# Patient Record
Sex: Male | Born: 1955 | Race: Black or African American | Hispanic: No | Marital: Single | Smoking: Current every day smoker
Health system: Southern US, Community
[De-identification: ages and names within clinical notes are randomized; demographics above are authoritative.]

## PROBLEM LIST (undated history)

## (undated) DIAGNOSIS — M549 Dorsalgia, unspecified: Secondary | ICD-10-CM

## (undated) DIAGNOSIS — I10 Essential (primary) hypertension: Secondary | ICD-10-CM

## (undated) DIAGNOSIS — G8929 Other chronic pain: Secondary | ICD-10-CM

## (undated) DIAGNOSIS — E119 Type 2 diabetes mellitus without complications: Secondary | ICD-10-CM

## (undated) DIAGNOSIS — G629 Polyneuropathy, unspecified: Secondary | ICD-10-CM

## (undated) DIAGNOSIS — I2510 Atherosclerotic heart disease of native coronary artery without angina pectoris: Secondary | ICD-10-CM

## (undated) DIAGNOSIS — I4891 Unspecified atrial fibrillation: Secondary | ICD-10-CM

## (undated) DIAGNOSIS — C801 Malignant (primary) neoplasm, unspecified: Secondary | ICD-10-CM

## (undated) DIAGNOSIS — I509 Heart failure, unspecified: Secondary | ICD-10-CM

## (undated) DIAGNOSIS — M79606 Pain in leg, unspecified: Secondary | ICD-10-CM

## (undated) HISTORY — PX: CORONARY ANGIOPLASTY WITH STENT PLACEMENT: SHX49

## (undated) HISTORY — PX: OTHER SURGICAL HISTORY: SHX169

---

## 2011-08-12 NOTE — ED Notes (Signed)
Pt reports SSCP that began just PTA. Pt reports that pain radiates to the left arm. Pt states that pain is sharp with tightness in the chest. Pain improved with SL NTG by EMS.

## 2011-08-12 NOTE — ED Notes (Signed)
Pt reports that he was arguing with S.O. When sx began tonight.

## 2011-08-12 NOTE — ED Notes (Signed)
Patient c/o mid-sternal chest pain that radiates to left arm. Patient describes pain as a sharp pain onset PTA. Per patient states pain may be caused by an argument between self and SO. Patient denies any SOB, nausea or vomiting at this time. Patient is on cardiac monitor.

## 2011-08-13 LAB — CBC WITH AUTOMATED DIFF
ABS. BASOPHILS: 0 10*3/uL (ref 0.0–0.06)
ABS. EOSINOPHILS: 0.2 10*3/uL (ref 0.0–0.4)
ABS. LYMPHOCYTES: 2.6 10*3/uL (ref 0.9–3.6)
ABS. MONOCYTES: 0.8 10*3/uL (ref 0.05–1.2)
ABS. NEUTROPHILS: 4.7 10*3/uL (ref 1.8–8.0)
BASOPHILS: 0 % (ref 0–2)
EOSINOPHILS: 2 % (ref 0–5)
HCT: 42 % (ref 36.0–48.0)
HGB: 14.6 g/dL (ref 13.0–16.0)
LYMPHOCYTES: 31 % (ref 21–52)
MCH: 29.5 PG (ref 24.0–34.0)
MCHC: 34.8 g/dL (ref 31.0–37.0)
MCV: 84.8 FL (ref 74.0–97.0)
MONOCYTES: 10 % (ref 3–10)
MPV: 10.2 FL (ref 9.2–11.8)
NEUTROPHILS: 57 % (ref 40–73)
PLATELET: 276 10*3/uL (ref 135–420)
RBC: 4.95 M/uL (ref 4.70–5.50)
RDW: 12.9 % (ref 11.6–14.5)
WBC: 8.2 10*3/uL (ref 4.6–13.2)

## 2011-08-13 LAB — GLUCOSTABILIZER
D50 administered: 0 ml
D50 administered: 0 ml
D50 administered: 0 ml
D50 administered: 0 ml
D50 order: 0 ml
D50 order: 0 ml
D50 order: 0 ml
D50 order: 0 ml
Glucose: 457 mg/dL
Glucose: 259 mg/dL
Glucose: 143 mg/dL
Glucose: 109 mg/dL
High target: 160 mg/dL
High target: 160 mg/dL
High target: 160 mg/dL
High target: 160 mg/dL
Insulin adminstered: 7.9 units/hour
Insulin adminstered: 4 units/hour
Insulin adminstered: 1.7 units/hour
Insulin adminstered: 0.5 units/hour
Insulin order: 7.9 units/hour
Insulin order: 4 units/hour
Insulin order: 1.7 units/hour
Insulin order: 0.5 units/hour
Low target: 120 mg/dL
Low target: 120 mg/dL
Low target: 120 mg/dL
Low target: 120 mg/dL
Minutes until next BG: 60 min
Minutes until next BG: 60 min
Minutes until next BG: 60 min
Minutes until next BG: 60 min
Multiplier: 0.02
Multiplier: 0.02
Multiplier: 0.02
Multiplier: 0.01

## 2011-08-13 LAB — CARDIAC PANEL,(CK, CKMB & TROPONIN)
CK - MB: 3 ng/ml (ref 0.5–3.6)
CK - MB: 3.3 ng/ml (ref 0.5–3.6)
CK-MB Index: 1.3 % (ref 0.0–4.0)
CK-MB Index: 1.5 % (ref 0.0–4.0)
CK: 236 U/L (ref 39–308)
CK: 217 U/L (ref 39–308)
Troponin-I, QT: <0.04 NG/ML (ref 0.00–0.06)
Troponin-I, QT: <0.04 NG/ML (ref 0.00–0.06)

## 2011-08-13 LAB — EKG, 12 LEAD, SUBSEQUENT
Atrial Rate: 64 {beats}/min
Calculated P Axis: 30 degrees
Calculated R Axis: 13 degrees
Calculated T Axis: -38 degrees
Diagnosis: NORMAL
P-R Interval: 178 ms
Q-T Interval: 424 ms
QRS Duration: 86 ms
QTC Calculation (Bezet): 437 ms
Ventricular Rate: 64 {beats}/min

## 2011-08-13 LAB — METABOLIC PANEL, BASIC
Anion gap: 12 mmol/L (ref 3.0–18)
BUN/Creatinine ratio: 11 — ABNORMAL LOW (ref 12–20)
BUN: 12 MG/DL (ref 7.0–18)
CO2: 23 MMOL/L (ref 21–32)
Calcium: 9.3 MG/DL (ref 8.5–10.1)
Chloride: 99 MMOL/L — ABNORMAL LOW (ref 100–108)
Creatinine: 1.1 MG/DL (ref 0.6–1.3)
GFR est AA: >60 mL/min/{1.73_m2} (ref >60)
GFR est non-AA: >60 mL/min/{1.73_m2} (ref >60)
Glucose: 429 MG/DL — CR (ref 74–99)
Potassium: 3.6 MMOL/L (ref 3.5–5.5)
Sodium: 134 MMOL/L — ABNORMAL LOW (ref 136–145)

## 2011-08-13 LAB — EKG, 12 LEAD, INITIAL
Atrial Rate: 85 {beats}/min
Calculated P Axis: 50 degrees
Calculated R Axis: 36 degrees
Calculated T Axis: -24 degrees
Diagnosis: NORMAL
P-R Interval: 186 ms
Q-T Interval: 362 ms
QRS Duration: 76 ms
QTC Calculation (Bezet): 430 ms
Ventricular Rate: 85 {beats}/min

## 2011-08-13 LAB — GLUCOSE, POC
Glucose (POC): 259 mg/dL — ABNORMAL HIGH (ref 70–110)
Glucose (POC): 143 mg/dL — ABNORMAL HIGH (ref 70–110)
Glucose (POC): 109 mg/dL (ref 70–110)
Glucose (POC): 149 mg/dL — ABNORMAL HIGH (ref 70–110)
Glucose (POC): 457 mg/dL — CR (ref 70–110)

## 2011-08-13 LAB — NT-PRO BNP: NT pro-BNP: 16 PG/ML (ref 0–250)

## 2011-08-13 MED ORDER — ENOXAPARIN 100 MG/ML SUB-Q SYRINGE
100 mg/mL | SUBCUTANEOUS | Status: AC
Start: 2011-08-13 — End: 2011-08-13
  Administered 2011-08-13: 06:00:00 via SUBCUTANEOUS

## 2011-08-13 MED ORDER — ONDANSETRON (PF) 4 MG/2 ML INJECTION
4 mg/2 mL | Freq: Once | INTRAMUSCULAR | Status: AC
Start: 2011-08-13 — End: 2011-08-13
  Administered 2011-08-13: 07:00:00 via INTRAVENOUS

## 2011-08-13 MED ORDER — HYDROMORPHONE 2 MG/ML INJECTION SOLUTION
2 mg/mL | Freq: Once | INTRAMUSCULAR | Status: AC
Start: 2011-08-13 — End: 2011-08-13
  Administered 2011-08-13: 07:00:00 via INTRAVENOUS

## 2011-08-13 MED ORDER — NITROGLYCERIN IN D5W 100 MG/250 ML (0.4 MG/ML) IV
100 mg/250 mL (400 mcg/mL) | INTRAVENOUS | Status: DC
Start: 2011-08-13 — End: 2011-08-13

## 2011-08-13 MED ORDER — OXYCODONE-ACETAMINOPHEN 5 MG-325 MG TAB
5-325 mg | ORAL_TABLET | ORAL | Status: DC | PRN
Start: 2011-08-13 — End: 2014-06-07

## 2011-08-13 MED ORDER — NITROGLYCERIN IN D5W 25 MG/250 ML (0.1 MG/ML) IV
25 mg/0 mL (100 mcg/mL) | INTRAVENOUS | Status: DC
Start: 2011-08-13 — End: 2011-08-13

## 2011-08-13 MED ORDER — SODIUM CHLORIDE 0.9 % IV
100 unit/mL | INTRAVENOUS | Status: DC
Start: 2011-08-13 — End: 2011-08-13
  Administered 2011-08-13 (×3): via INTRAVENOUS

## 2011-08-13 MED ORDER — ONDANSETRON (PF) 4 MG/2 ML INJECTION
4 mg/2 mL | Freq: Once | INTRAMUSCULAR | Status: AC
Start: 2011-08-13 — End: 2011-08-13
  Administered 2011-08-13: 04:00:00 via INTRAVENOUS

## 2011-08-13 MED ORDER — MORPHINE 4 MG/ML SYRINGE
4 mg/mL | Freq: Once | INTRAMUSCULAR | Status: AC
Start: 2011-08-13 — End: 2011-08-13
  Administered 2011-08-13: 04:00:00 via INTRAVENOUS

## 2011-08-13 MED ORDER — NITROGLYCERIN 0.4 MG SUBLINGUAL TAB
0.4 mg | SUBLINGUAL | Status: AC
Start: 2011-08-13 — End: 2011-08-13
  Administered 2011-08-13: 04:00:00 via SUBLINGUAL

## 2011-08-13 MED ORDER — SODIUM CHLORIDE 0.9% BOLUS IV
0.9 % | Freq: Once | INTRAVENOUS | Status: AC
Start: 2011-08-13 — End: 2011-08-13
  Administered 2011-08-13: 05:00:00 via INTRAVENOUS

## 2011-08-13 MED ORDER — INSULIN REGULAR HUMAN 100 UNIT/ML INJECTION
100 unit/mL | INTRAMUSCULAR | Status: AC
Start: 2011-08-13 — End: 2011-08-13
  Administered 2011-08-13: 05:00:00 via INTRAVENOUS

## 2011-08-13 NOTE — ED Notes (Signed)
Blood Glucose obtained POC, value 259 MD notified. Value entered into Glucose stabilizer.

## 2011-08-13 NOTE — ED Notes (Signed)
Patient verbalizes some relief from SL 0.4 nitroglycerin tablet, patient staes, "the pain is easing up but it's still there." Patient at rest remains on cardiac monitor.

## 2011-08-13 NOTE — ED Notes (Signed)
Per patient chest pain and left arm pain has resolved, pain 0/10.

## 2011-08-13 NOTE — ED Notes (Signed)
Dr. Sandi Mariscal at bedside speaking with patient and SO.

## 2011-08-13 NOTE — ED Notes (Signed)
I have reviewed discharge instructions with the patient.  The patient verbalized understanding.Patient armband removed and shredded

## 2011-08-13 NOTE — ED Notes (Signed)
Pt ambulated to bathroom and complained of nausea, pt vomited in bathroom and became dizzy upon returning to room.

## 2011-08-13 NOTE — ED Notes (Signed)
Blood glucose obtained POC, value 457. MD notified.

## 2011-08-13 NOTE — ED Notes (Signed)
Per MD orders insulin drip has been discontinued.

## 2011-08-13 NOTE — ED Notes (Signed)
Obtained blood glucose POC, 109. MD notified. Value 109  placed in Glucose stabilizer.

## 2011-08-13 NOTE — ED Provider Notes (Signed)
HPI Comments: 55 y.o. male presents to the ED C/O chest pain. The Pt states he began to have sharp chest pain "while arguing with my crazy baby momma" an hour ago. This chest pain is exacerbated by cough and is reported to feel similar to pain during previous MIs. Pt further notes some nausea associated with this chest pain. He recently spent 2 days at [facility] for chest pain and hyperglycemia (700s) and was Dx'ed with musculoskeletal chest pain. Pt notes he has not been "feeling right" since discharge from [facility] and has been under increased stress. Hx of 2 MIs, HTN, DM, hypercholesterolemia. Surgical Hx includes 3 cardiac stents. Last heart catheterization in 2010. Denies tobacco, alcohol, or illicit drug use. Pt denies any further Sx's or complaints.     Cardio: Dr. Randa Evens     Patient is a 55 y.o. male presenting with chest pain. The history is provided by the patient.   Chest Pain (Angina)   This is a new problem. The current episode started less than 1 hour ago. The problem has not changed since onset.The pain is associated with emotional upset. The quality of the pain is described as sharp. The pain does not radiate. Associated symptoms include cough and nausea. Pertinent negatives include no abdominal pain, no fever, no headaches, no palpitations, no shortness of breath and no vomiting. Risk factors include diabetes mellitus, stress, hypertension and male gender. Procedural history includes cardiac catheterization, echocardiogram and cardiac stents.  12:16 AM  Recorded by Arna Snipe, ED Scribe as dictated by Hetty Blend, MD     Past Medical History   Diagnosis Date   ??? CAD (coronary artery disease)    ??? Diabetes    ??? MI (myocardial infarction)    ??? Hypertension    ??? High cholesterol         Past Surgical History   Procedure Date   ??? Pr cardiac surg procedure unlist    ??? Hx coronary stent placement          No family history on file.     History     Social History   ??? Marital Status: N/A      Spouse Name: N/A     Number of Children: N/A   ??? Years of Education: N/A     Occupational History   ??? Not on file.     Social History Main Topics   ??? Smoking status: Never Smoker    ??? Smokeless tobacco: Not on file   ??? Alcohol Use: No   ??? Drug Use:    ??? Sexually Active:      Other Topics Concern   ??? Not on file     Social History Narrative   ??? No narrative on file                  ALLERGIES: Review of patient's allergies indicates no known allergies.      Review of Systems   Constitutional: Negative for fever and fatigue.   HENT: Negative for sore throat and rhinorrhea.    Respiratory: Positive for cough. Negative for shortness of breath.    Cardiovascular: Positive for chest pain. Negative for palpitations.   Gastrointestinal: Positive for nausea. Negative for vomiting, abdominal pain and diarrhea.   Genitourinary: Negative for dysuria and difficulty urinating.   Musculoskeletal: Negative for myalgias and arthralgias.   Skin: Negative for color change and rash.   Neurological: Negative for light-headedness and headaches.   12:16 AM  Recorded  by Arna Snipe, ED Scribe as dictated by Hetty Blend, MD    Filed Vitals:    08/12/11 2349 08/12/11 2352   BP: 107/80    Pulse: 83    Temp: 98 ??F (36.7 ??C)    Resp: 22    Height: 5\' 11"  (1.803 m)    Weight: 89.812 kg (198 lb)    SpO2: 99% 100%            Physical Exam   Vital signs and nursing notes reviewed    CONSTITUTIONAL: Middle aged male, appears to be anxious, alert, in mild pain distress; well-developed; well-nourished.  HEAD:  Normocephalic, atraumatic  EYES: PERRL; EOM's intact.  ENTM: Nose: no rhinorrhea; Throat: no erythema or exudate, mucous membranes moist  Neck:  No JVD, supple without lymphadenopathy  RESP: Chest clear, equal breath sounds.  CHEST: Chest wall tenderness to palpation  CV: S1 and S2 WNL; No murmurs, gallops or rubs.  GI: Normal bowel sounds, abdomen soft and non-tender. No masses or organomegaly.   GU: No costo-vertebral angle tenderness.  BACK:  Non-tender  UPPER EXT:  Normal inspection  LOWER EXT: No edema, no calf tenderness.  Distal pulses intact.  NEURO: CN intact, sensation intact.  SKIN: No rashes; Normal for age and stage.  PSYCH:  Alert and oriented, normal affect.   RECTAL: Brown stool, HEME negative      MDM     Differential Diagnosis; Clinical Impression; Plan:     DDx includes: ACS, Anxiety, Chest wall pain, Pleuritic chest pain, PNA    Pt presented with chest pain after an argument.  His cardiac enzymes are negative x 2.  He had a negative nuclear stress test in September.  Dr. Azucena Cecil recommends DC home and FU with Dr. Randa Evens.      Pt had a near syncopal episode near time of discharge.  He had been lying in bed for 6 hours, got up, became nauseated, vomited.  He had no chest pain.  After 15 minutes his orthostatics were normal.    Amount and/or Complexity of Data Reviewed:   Clinical lab tests:  Ordered and reviewed  Tests in the radiology section of CPT??:  Ordered and reviewed (CXR)  Tests in the medicine section of the CPT??:  Ordered and reviewed (EKG)  Discussion of test results with the performing providers:  No   Decide to obtain previous medical records or to obtain history from someone other than the patient:  No   Obtain history from someone other than the patient:  No   Review and summarize past medical records:  Yes   Discuss the patient with another provider:  No   Independant visualization of image, tracing, or specimen:  Yes (CXR and EKG)  Progress:   Patient progress:  Stable and improved      Procedures    Procedure Note - Rectal Exam:   6:49 AM  Performed by: Hetty Blend, MD  Rectal exam performed.  Brown stool was collected.  Stool was Hemoccult tested, and found to be heme Negative.   The procedure took 1-15 minutes, and pt tolerated well.  Written by Larey Days, ED Scribe, as dictated by Hetty Blend, MD.      Answered patient's questions regarding treatment plan.      PROGRESS NOTE:   2:01 AM  Pt has been re-examined by Kenn File, MD. Pt still C/O CP, will start Pt on nitro drip.   Written by Larey Days, ED Scribe,  as dictated by Hetty Blend, MD.     CONSULT NOTE:   2:14 AM  Hetty Blend, MD spoke with Dr. Waneta Martins   Specialty: Internal Medicine   Discussed pt's hx, disposition, and available diagnostic and imaging results. Reviewed care plans. Consulting physician agrees with plans as outlined.  Advises contacting Pt's cardiologist for admission.   Written by Larey Days, ED Scribe, as dictated by Hetty Blend, MD    CONSULT NOTE:   2:15 AM  Hetty Blend, MD spoke with Dr. Azucena Cecil   Specialty: Cardiology  Discussed pt's hx, disposition, and available diagnostic and imaging results. Reviewed care plans. Consulting physician agrees with plans as outlined.  Advises second set of enzymes and if negative can D/C with cardiology F/U today.   Written by Larey Days, ED Scribe, as dictated by Hetty Blend, MD        ED EKG interpretation:  NSR 85, nonspecific ST and T wave abnormality, read by Hetty Blend, MD at [phone]     ED EKG interpretation:  NSR 64, nonspecific T wave abnormality, no change from previous, read by Hetty Blend, MD at [phone]     Answered patient questions regarding Tx.    PROGRESS NOTE:   6:10 AM  Pt has been re-examined by Kenn File, MD. Pt feeling better, second set of enzymes negative, will D/C with cardiology follow up today.   Written by Larey Days, ED Scribe, as dictated by Hetty Blend, MD     6:13 AM   Patients results have been reviewed with them.  Patient and/or family have verbally conveyed their understanding and agreement of the patient's signs, symptoms, diagnosis, treatment and prognosis and additionally agree to follow up as recommended or return to the Emergency Room should their condition change prior to their follow-up appointment. Patient verbally agrees with the care-plan and verbally conveys that all of their questions have been answered.   Discharge instructions have also been provided to the patient with some educational information regarding their diagnosis as well a list of reasons why they would want to return to the ER prior to their follow-up appointment should their condition change.     Labs Reviewed   METABOLIC PANEL, BASIC - Abnormal; Notable for the following:     Sodium 134 (*)      Chloride 99 (*)      Glucose 429 (*)      BUN/Creatinine ratio 11 (*)      All other components within normal limits   GLUCOSE, POC - Abnormal; Notable for the following:     Glucose - (glucometer POC) 259 (*)      All other components within normal limits   GLUCOSE, POC - Abnormal; Notable for the following:     Glucose - (glucometer POC) 143 (*)      All other components within normal limits   CBC WITH AUTOMATED DIFF   CARDIAC PANEL,(CK, CKMB & TROPONIN)   PRO-BNP   GLUCOSTABILIZER   CARDIAC PANEL,(CK, CKMB & TROPONIN)   GLUCOSTABILIZER   GLUCOSTABILIZER   GLUCOSTABILIZER    Narrative:     per MD d/c Insulin drip   GLUCOSE, POC       Recent Results (from the past 12 hour(s))   EKG, 12 LEAD, INITIAL    Collection Time    08/12/11 11:46 PM       Component Value Range    Ventricular Rate 85      Atrial Rate 85  P-R Interval 186      QRS Duration 76      Q-T Interval 362      QTC Calculation (Bezet) 430      Calculated P Axis 50      Calculated R Axis 36      Calculated T Axis -24      Diagnosis        Value: Normal sinus rhythm      Nonspecific ST and T wave abnormality      Abnormal ECG       No previous ECGs available   CBC WITH AUTOMATED DIFF    Collection Time    08/12/11 11:50 PM       Component Value Range    WBC 8.2  4.6 - 13.2 K/uL    RBC 4.95  4.70 - 5.50 M/uL    HGB 14.6  13.0 - 16.0 g/dL    HCT 16.1  09.6 - 04.5 %    MCV 84.8  74.0 - 97.0 FL    MCH 29.5  24.0 - 34.0 PG    MCHC 34.8  31.0 - 37.0 g/dL    RDW 40.9  81.1 - 91.4 %    PLATELET 276  135 - 420 K/uL    MPV 10.2  9.2 - 11.8 FL    NEUTROPHILS 57  40 - 73 %    LYMPHOCYTES 31  21 - 52 %    MONOCYTES 10  3 - 10 %    EOSINOPHILS 2  0 - 5 %    BASOPHILS 0  0 - 2 %    ABS. NEUTROPHILS 4.7  1.8 - 8.0 K/UL    ABS. LYMPHOCYTES 2.6  0.9 - 3.6 K/UL    ABS. MONOCYTES 0.8  0.05 - 1.2 K/UL    ABS. EOSINOPHILS 0.2  0.0 - 0.4 K/UL    ABS. BASOPHILS 0.0  0.0 - 0.06 K/UL    DF AUTOMATED     METABOLIC PANEL, BASIC    Collection Time    08/12/11 11:50 PM       Component Value Range    Sodium 134 (*) 136 - 145 MMOL/L    Potassium 3.6  3.5 - 5.5 MMOL/L    Chloride 99 (*) 100 - 108 MMOL/L    CO2 23  21 - 32 MMOL/L    Anion gap 12  3.0 - 18 mmol/L    Glucose 429 (*) 74 - 99 MG/DL    BUN 12  7.0 - 18 MG/DL    Creatinine 1.1  0.6 - 1.3 MG/DL    BUN/Creatinine ratio 11 (*) 12 - 20      GFR est AA >60  >60 ml/min/1.50m2    GFR est non-AA >60  >60 ml/min/1.63m2    Calcium 9.3  8.5 - 10.1 MG/DL   CARDIAC PANEL,(CK, CKMB & TROPONIN)    Collection Time    08/12/11 11:50 PM       Component Value Range    CK 236  39 - 308 U/L    CK - MB 3.0  0.5 - 3.6 ng/ml    CK-MB Index 1.3  0.0 - 4.0 %    Troponin-I, Qt. <0.04  0.00 - 0.06 NG/ML   PRO-BNP    Collection Time    08/12/11 11:50 PM       Component Value Range    NT pro-BNP 16  0 - 250 PG/ML   GLUCOSTABILIZER    Collection Time  08/13/11  2:08 AM       Component Value Range    Glucose 457      Insulin order 7.9      Insulin adminstered 7.9      Multiplier 0.02      Low target 120      High target 160      D50 order 0.0      D50 administered 0.00      Minutes until next BG 60      Order initials sm       Administered initials sm     GLUCOSE, POC    Collection Time    08/13/11  3:11 AM       Component Value Range    Glucose - (glucometer POC) 259 (*) 70 - 110 mg/dL   GLUCOSTABILIZER    Collection Time    08/13/11  3:15 AM       Component Value Range    Glucose 259      Insulin order 4.0      Insulin adminstered 4.0      Multiplier 0.02      Low target 120      High target 160      D50 order 0.0      D50 administered 0.00      Minutes until next BG 60      Order initials sm      Administered initials sm     GLUCOSE, POC    Collection Time    08/13/11  4:19 AM       Component Value Range    Glucose - (glucometer POC) 143 (*) 70 - 110 mg/dL   GLUCOSTABILIZER    Collection Time    08/13/11  4:21 AM       Component Value Range    Glucose 143      Insulin order 1.7      Insulin adminstered 1.7      Multiplier 0.02      Low target 120      High target 160      D50 order 0.0      D50 administered 0.00      Minutes until next BG 60      Order initials SSM      Administered initials ADM     GLUCOSE, POC    Collection Time    08/13/11  5:23 AM       Component Value Range    Glucose - (glucometer POC) 109  70 - 110 mg/dL   CARDIAC PANEL,(CK, CKMB & TROPONIN)    Collection Time    08/13/11  5:27 AM       Component Value Range    CK 217  39 - 308 U/L    CK - MB 3.3  0.5 - 3.6 ng/ml    CK-MB Index 1.5  0.0 - 4.0 %    Troponin-I, Qt. <0.04  0.00 - 0.06 NG/ML   GLUCOSTABILIZER    Collection Time    08/13/11  5:27 AM       Component Value Range    Glucose 109      Insulin order 0.5      Insulin adminstered 0.5      Multiplier 0.01      Low target 120      High target 160      D50 order 0.0      D50 administered 0.00  Minutes until next BG 60      Order initials SSM      Administered initials ADM     EKG, 12 LEAD, SUBSEQUENT    Collection Time    08/13/11  5:31 AM       Component Value Range    Ventricular Rate 64      Atrial Rate 64      P-R Interval 178      QRS Duration 86      Q-T Interval 424       QTC Calculation (Bezet) 437      Calculated P Axis 30      Calculated R Axis 13      Calculated T Axis -38      Diagnosis        Value: Normal sinus rhythm      Nonspecific T wave abnormality      Abnormal ECG      When compared with ECG of 12-Aug-2011 23:46,      No significant change was found       CLINICAL IMPRESSION    1. Chest pain    2. Anxiety

## 2012-03-01 NOTE — ED Notes (Signed)
Assumed care of pt. Pt lying in bed resting with eyes closed. Awakens to voice. Complains of slight pain 5/10 to left side of face. Denies needs, vital signs updated. Wife at bedside. Call bell within reach. Will continue to monitor.

## 2012-03-01 NOTE — ED Notes (Signed)
Pt on cont cm nbp so2. INT in place, site benign. IVF complete. POC BGL completed. Spouse at bedside. Eyes closed, chest rises visible. Pt in NAD or pain.

## 2012-03-01 NOTE — ED Provider Notes (Signed)
HPI Comments: 56 y.o. male presents to the ED C/O CP, onset PTA. Pt reports after eating fish and rice, he began having CP. He states he ran to look in the mirror and "my face twisted up. And all of the sudden I couldn't swallow." Pt felt like his jaw was locked and he describes a shooting pain from his head down his left side. Pt Hx of ETOH use of beer. Pt Hx of MI. Pt was compliant with his Plavix but he didn't take aspirin today. EMS report pt wasn't real speaking to them and he was "playing Charades." Pt denies taking any "strange" medications like Haldol today, cough, fever, heavy lifting and any other Sx or complaints.      Patient is a 56 y.o. male presenting with chest pain. The history is provided by the patient and the EMS personnel. No language interpreter was used.   Chest Pain (Angina)   This is a new problem. The current episode started 2 days ago. The problem has not changed since onset.The problem occurs constantly. The pain radiates to the left arm. Pertinent negatives include no abdominal pain, no cough, no fever, no headaches, no nausea, no palpitations, no shortness of breath and no vomiting.   Written by Delia Heady, ED Scribe, as dictated by Bailey Mech, MD.     Past Medical History   Diagnosis Date   ??? CAD (coronary artery disease)    ??? Diabetes    ??? MI (myocardial infarction)    ??? Hypertension    ??? High cholesterol         Past Surgical History   Procedure Date   ??? Pr cardiac surg procedure unlist    ??? Hx coronary stent placement          History reviewed. No pertinent family history.     History     Social History   ??? Marital Status: SINGLE     Spouse Name: N/A     Number of Children: N/A   ??? Years of Education: N/A     Occupational History   ??? Not on file.     Social History Main Topics   ??? Smoking status: Never Smoker    ??? Smokeless tobacco: Not on file   ??? Alcohol Use: Yes   ??? Drug Use:    ??? Sexually Active:      Other Topics Concern   ??? Not on file     Social History Narrative   ???  No narrative on file                  ALLERGIES: Review of patient's allergies indicates no known allergies.      Review of Systems   Constitutional: Negative for fever and fatigue.   HENT: Negative for sore throat and rhinorrhea.    Respiratory: Negative for cough and shortness of breath.    Cardiovascular: Positive for chest pain. Negative for palpitations.   Gastrointestinal: Negative for nausea, vomiting, abdominal pain and diarrhea.   Genitourinary: Negative for dysuria and difficulty urinating.   Musculoskeletal: Negative for myalgias and arthralgias.   Skin: Negative for color change and rash.   Neurological: Negative for light-headedness and headaches.   All other systems reviewed and are negative.    Written by Delia Heady, ED Scribe, as dictated by Bailey Mech, MD.     Filed Vitals:    03/01/12 2200 03/01/12 2230 03/01/12 2325 03/01/12 2359   BP: 134/68 122/78 124/70  Pulse: 90 87 90    Temp:    98.6 ??F (37 ??C)   Resp: 15 18 18     Height:       Weight:       SpO2: 98% 99% 97%             Physical Exam   Nursing note and vitals reviewed.  Constitutional: He is oriented to person, place, and time. He appears well-developed and well-nourished.   HENT:   Head: Normocephalic and atraumatic.   Mouth/Throat: Oropharynx is clear and moist.   Eyes: Conjunctivae and EOM are normal. Pupils are equal, round, and reactive to light.   Neck: Normal range of motion. Neck supple. No JVD present. No tracheal deviation present. No thyromegaly present.   Cardiovascular: Normal rate, regular rhythm, normal heart sounds and intact distal pulses.  Exam reveals no gallop and no friction rub.    No murmur heard.  Pulmonary/Chest: Effort normal and breath sounds normal. No respiratory distress. He has no wheezes. He has no rales. He exhibits tenderness (chest wall TTP).   Abdominal: Soft. Bowel sounds are normal. He exhibits no distension and no mass. There is no tenderness. There is no rebound and no guarding.    Musculoskeletal: Normal range of motion.   Neurological: He is alert and oriented to person, place, and time. He has normal reflexes. No cranial nerve deficit.   Skin: Skin is warm and dry. No rash noted.   Psychiatric: He has a normal mood and affect. His behavior is normal.        MDM     Differential Diagnosis; Clinical Impression; Plan:     DDx includes: dystonic reaction, ACS, chest wall pain, GERD  Amount and/or Complexity of Data Reviewed:   Clinical lab tests:  Ordered and reviewed  Tests in the radiology section of CPT??:  Ordered and reviewed (CXR)  Tests in the medicine section of the CPT??:  Ordered and reviewed (EKG)   Obtain history from someone other than the patient:  No   Discuss the patient with another provider:  Yes (EMS)   Independant visualization of image, tracing, or specimen:  Yes (CXR, EKG)      Procedures    PROGRESS NOTE:   8:22 PM Answered patient questions regarding Tx.   Written by Delia Heady, ED Scribe, as dictated by Bailey Mech, MD.    Results    ED EKG interpretation:  Rhythm: normal sinus rhythm; and regular . Rate (approx.): 94; Read by Bailey Mech, MD at 2021.    RADIOLOGY RESULT:  8:55 PM   chest x-ray shows NAD.   Pending review from the radiologist.   Written by Delia Heady, ED Scribe, as dictated by Bailey Mech, MD.      Labs Reviewed   METABOLIC PANEL, COMPREHENSIVE - Abnormal; Notable for the following:     Potassium 3.4 (*)      Glucose 469 (*)      BUN/Creatinine ratio 8 (*)      AST 14 (*)      All other components within normal limits   DRUG SCREEN, URINE - Abnormal; Notable for the following:     THC (TH-CANNABINOL) POSITIVE (*)      COCAINE POSITIVE (*)      All other components within normal limits   GLUCOSE, POC - Abnormal; Notable for the following:     Glucose - (glucometer POC) 320 (*)      All other components within normal limits  GLUCOSE, POC - Abnormal; Notable for the following:     Glucose - (glucometer POC) 195 (*)      All other  components within normal limits   CBC WITH AUTOMATED DIFF   CARDIAC PANEL,(CK, CKMB & TROPONIN)   CARDIAC PANEL,(CK, CKMB & TROPONIN)   POC GLUCOSE       Recent Results (from the past 12 hour(s))   EKG, 12 LEAD, INITIAL    Collection Time    03/01/12  8:19 PM       Component Value Range    Ventricular Rate 94      Atrial Rate 94      P-R Interval 192      QRS Duration 76      Q-T Interval 356      QTC Calculation (Bezet) 445      Calculated P Axis 63      Calculated R Axis -20      Calculated T Axis 52      Diagnosis        Value: Normal sinus rhythm      Normal ECG      When compared with ECG of 13-Aug-2011 05:31,      Nonspecific T wave abnormality no longer evident in Inferior leads      Nonspecific T wave abnormality, improved in Lateral leads   CBC WITH AUTOMATED DIFF    Collection Time    03/01/12  8:20 PM       Component Value Range    WBC 7.7  4.6 - 13.2 K/uL    RBC 5.15  4.70 - 5.50 M/uL    HGB 15.4  13.0 - 16.0 g/dL    HCT 16.1  09.6 - 04.5 %    MCV 86.6  74.0 - 97.0 FL    MCH 29.9  24.0 - 34.0 PG    MCHC 34.5  31.0 - 37.0 g/dL    RDW 40.9  81.1 - 91.4 %    PLATELET 230  135 - 420 K/uL    MPV 9.8  9.2 - 11.8 FL    NEUTROPHILS 46  40 - 73 %    LYMPHOCYTES 44  21 - 52 %    MONOCYTES 9  3 - 10 %    EOSINOPHILS 1  0 - 5 %    BASOPHILS 0  0 - 2 %    ABS. NEUTROPHILS 3.5  1.8 - 8.0 K/UL    ABS. LYMPHOCYTES 3.4  0.9 - 3.6 K/UL    ABS. MONOCYTES 0.7  0.05 - 1.2 K/UL    ABS. EOSINOPHILS 0.1  0.0 - 0.4 K/UL    ABS. BASOPHILS 0.0  0.0 - 0.06 K/UL    DF AUTOMATED     CARDIAC PANEL,(CK, CKMB & TROPONIN)    Collection Time    03/01/12  8:20 PM       Component Value Range    CK 146  39 - 308 U/L    CK - MB 1.3  0.5 - 3.6 ng/ml    CK-MB Index 0.9  0.0 - 4.0 %    Troponin-I, Qt. <0.02  0.00 - 0.06 NG/ML   METABOLIC PANEL, COMPREHENSIVE    Collection Time    03/01/12  8:20 PM       Component Value Range    Sodium 137  136 - 145 MMOL/L    Potassium 3.4 (*) 3.5 - 5.5 MMOL/L    Chloride 101  100 - 108 MMOL/L  CO2 24  21 - 32  MMOL/L    Anion gap 12  3.0 - 18 mmol/L    Glucose 469 (*) 74 - 99 MG/DL    BUN 9  7.0 - 18 MG/DL    Creatinine 1.61  0.6 - 1.3 MG/DL    BUN/Creatinine ratio 8 (*) 12 - 20      GFR est AA >60  >60 ml/min/1.46m2    GFR est non-AA >60  >60 ml/min/1.19m2    Calcium 8.6  8.5 - 10.1 MG/DL    Bilirubin, total 0.3  0.2 - 1.0 MG/DL    ALT 38  09.6 - 04.5 U/L    AST 14 (*) 15 - 37 U/L    Alk. phosphatase 63  50 - 136 U/L    Protein, total 7.1  6.4 - 8.2 g/dL    Albumin 3.6  3.4 - 5.0 g/dL    Globulin 3.5  2.0 - 4.0 g/dL    A-G Ratio 1.0  0.8 - 1.7     DRUG SCREEN, URINE    Collection Time    03/01/12  9:10 PM       Component Value Range    PCP(PHENCYCLIDINE) NEGATIVE   NEGATIVE    THC (TH-CANNABINOL) POSITIVE (*) NEGATIVE    AMPHETAMINE NEGATIVE   NEGATIVE    BARBITURATES NEGATIVE   NEGATIVE    BENZODIAZEPINE NEGATIVE   NEGATIVE    COCAINE POSITIVE (*) NEGATIVE    OPIATES NEGATIVE   NEGATIVE    TRICYCLICS NEGATIVE   NEGATIVE    HDSCOM        Value: Specimen analysis was performed without chain of custody handling.  These results should be used for medical purposes only and not for legal or employment purposes.  Unconfirmed screening results must not be used for non-medical purposes.   GLUCOSE, POC    Collection Time    03/01/12 10:23 PM       Component Value Range    Glucose - (glucometer POC) 320 (*) 70 - 110 mg/dL   CARDIAC PANEL,(CK, CKMB & TROPONIN)    Collection Time    03/01/12 11:15 PM       Component Value Range    CK 122  39 - 308 U/L    CK - MB 1.9  0.5 - 3.6 ng/ml    CK-MB Index 1.6  0.0 - 4.0 %    Troponin-I, Qt. <0.02  0.00 - 0.06 NG/ML   GLUCOSE, POC    Collection Time    03/01/12 11:58 PM       Component Value Range    Glucose - (glucometer POC) 195 (*) 70 - 110 mg/dL      PROGRESS NOTE:   40:98 AM  Pt has been re-examined by Virl Diamond, MD. Patient resting comfortably, no complaints.  Admits to cocaine use.  Advised not to use cocaine.  Written by Tressie Ellis, ED Scribe, as dictated by Bailey Mech,  MD .     12:11 AM   Avry Burnstein's  results have been reviewed with him.  He has been counseled regarding his diagnosis, treatment, and plan.  He verbally conveys understanding and agreement of the signs, symptoms, diagnosis, treatment and prognosis and additionally agrees to follow up as discussed.  He also agrees with the care-plan and conveys that all of his questions have been answered.  I have also provided discharge instructions for him that include: educational information regarding their diagnosis and treatment, and list of  reasons why they would want to return to the ED prior to their follow-up appointment, should his condition change.       CLINICAL IMPRESSION    1. Diabetes mellitus    2. Hyperglycemia    3. Cocaine abuse

## 2012-03-01 NOTE — ED Notes (Signed)
Chest pain started while sitting at table PTA, pt is poor historian, history of MI X 2 in the past,

## 2012-03-01 NOTE — ED Notes (Signed)
POC glucose is 195 mg/dl. Pt resting comfortably on stretcher, awaiting MD to come discuss lab results with him. Denies needs.

## 2012-03-02 LAB — METABOLIC PANEL, COMPREHENSIVE
A-G Ratio: 1 (ref 0.8–1.7)
ALT (SGPT): 38 U/L (ref 12.0–78.0)
AST (SGOT): 14 U/L — ABNORMAL LOW (ref 15–37)
Albumin: 3.6 g/dL (ref 3.4–5.0)
Alk. phosphatase: 63 U/L (ref 50–136)
Anion gap: 12 mmol/L (ref 3.0–18)
BUN/Creatinine ratio: 8 — ABNORMAL LOW (ref 12–20)
BUN: 9 MG/DL (ref 7.0–18)
Bilirubin, total: 0.3 MG/DL (ref 0.2–1.0)
CO2: 24 MMOL/L (ref 21–32)
Calcium: 8.6 MG/DL (ref 8.5–10.1)
Chloride: 101 MMOL/L (ref 100–108)
Creatinine: 1.18 MG/DL (ref 0.6–1.3)
GFR est AA: >60 mL/min/{1.73_m2} (ref >60)
GFR est non-AA: >60 mL/min/{1.73_m2} (ref >60)
Globulin: 3.5 g/dL (ref 2.0–4.0)
Glucose: 469 MG/DL — CR (ref 74–99)
Potassium: 3.4 MMOL/L — ABNORMAL LOW (ref 3.5–5.5)
Protein, total: 7.1 g/dL (ref 6.4–8.2)
Sodium: 137 MMOL/L (ref 136–145)

## 2012-03-02 LAB — CARDIAC PANEL,(CK, CKMB & TROPONIN)
CK - MB: 1.3 ng/ml (ref 0.5–3.6)
CK - MB: 1.9 ng/ml (ref 0.5–3.6)
CK-MB Index: 0.9 % (ref 0.0–4.0)
CK-MB Index: 1.6 % (ref 0.0–4.0)
CK: 146 U/L (ref 39–308)
CK: 122 U/L (ref 39–308)
Troponin-I, QT: <0.02 NG/ML (ref 0.00–0.06)
Troponin-I, QT: <0.02 NG/ML (ref 0.00–0.06)

## 2012-03-02 LAB — CBC WITH AUTOMATED DIFF
ABS. BASOPHILS: 0 10*3/uL (ref 0.0–0.06)
ABS. EOSINOPHILS: 0.1 10*3/uL (ref 0.0–0.4)
ABS. LYMPHOCYTES: 3.4 10*3/uL (ref 0.9–3.6)
ABS. MONOCYTES: 0.7 10*3/uL (ref 0.05–1.2)
ABS. NEUTROPHILS: 3.5 10*3/uL (ref 1.8–8.0)
BASOPHILS: 0 % (ref 0–2)
EOSINOPHILS: 1 % (ref 0–5)
HCT: 44.6 % (ref 36.0–48.0)
HGB: 15.4 g/dL (ref 13.0–16.0)
LYMPHOCYTES: 44 % (ref 21–52)
MCH: 29.9 PG (ref 24.0–34.0)
MCHC: 34.5 g/dL (ref 31.0–37.0)
MCV: 86.6 FL (ref 74.0–97.0)
MONOCYTES: 9 % (ref 3–10)
MPV: 9.8 FL (ref 9.2–11.8)
NEUTROPHILS: 46 % (ref 40–73)
PLATELET: 230 10*3/uL (ref 135–420)
RBC: 5.15 M/uL (ref 4.70–5.50)
RDW: 12.8 % (ref 11.6–14.5)
WBC: 7.7 10*3/uL (ref 4.6–13.2)

## 2012-03-02 LAB — DRUG SCREEN, URINE
AMPHETAMINES: NEGATIVE
BARBITURATES: NEGATIVE
BENZODIAZEPINES: NEGATIVE
COCAINE: POSITIVE — AB
OPIATES: NEGATIVE
PCP(PHENCYCLIDINE): NEGATIVE
THC (TH-CANNABINOL): POSITIVE — AB
TRICYCLICS: NEGATIVE

## 2012-03-02 LAB — EKG, 12 LEAD, INITIAL
Atrial Rate: 94 {beats}/min
Calculated P Axis: 63 degrees
Calculated R Axis: -20 degrees
Calculated T Axis: 52 degrees
Diagnosis: NORMAL
P-R Interval: 192 ms
Q-T Interval: 356 ms
QRS Duration: 76 ms
QTC Calculation (Bezet): 445 ms
Ventricular Rate: 94 {beats}/min

## 2012-03-02 LAB — GLUCOSE, POC
Glucose (POC): 320 mg/dL — ABNORMAL HIGH (ref 70–110)
Glucose (POC): 195 mg/dL — ABNORMAL HIGH (ref 70–110)

## 2012-03-02 MED ORDER — SODIUM CHLORIDE 0.9% BOLUS IV
0.9 % | Freq: Once | INTRAVENOUS | Status: AC
Start: 2012-03-02 — End: 2012-03-02
  Administered 2012-03-02: 02:00:00 via INTRAVENOUS

## 2012-03-02 MED ORDER — SODIUM CHLORIDE 0.9 % IJ SYRG
INTRAMUSCULAR | Status: DC | PRN
Start: 2012-03-02 — End: 2012-03-02

## 2012-03-02 MED ORDER — NITROGLYCERIN 0.4 MG SUBLINGUAL TAB
0.4 mg | SUBLINGUAL | Status: AC
Start: 2012-03-02 — End: 2012-03-01
  Administered 2012-03-02: via SUBLINGUAL

## 2012-03-02 MED ORDER — INSULIN REGULAR HUMAN 100 UNIT/ML INJECTION
100 unit/mL | INTRAMUSCULAR | Status: AC
Start: 2012-03-02 — End: 2012-03-01
  Administered 2012-03-02: 03:00:00 via INTRAVENOUS

## 2012-03-02 MED ORDER — DIPHENHYDRAMINE HCL 50 MG/ML IJ SOLN
50 mg/mL | INTRAMUSCULAR | Status: AC
Start: 2012-03-02 — End: 2012-03-01
  Administered 2012-03-02: 01:00:00 via INTRAVENOUS

## 2012-03-02 MED ORDER — ASPIRIN 325 MG TAB
325 mg | ORAL | Status: AC
Start: 2012-03-02 — End: 2012-03-01
  Administered 2012-03-02: 01:00:00 via ORAL

## 2012-03-02 MED ORDER — INSULIN REGULAR HUMAN 100 UNIT/ML INJECTION
100 unit/mL | INTRAMUSCULAR | Status: AC
Start: 2012-03-02 — End: 2012-03-01
  Administered 2012-03-02: 02:00:00 via INTRAVENOUS

## 2012-03-02 MED ORDER — SODIUM CHLORIDE 0.9 % IJ SYRG
Freq: Three times a day (TID) | INTRAMUSCULAR | Status: DC
Start: 2012-03-02 — End: 2012-03-02

## 2012-03-02 MED FILL — ASPIRIN 325 MG TAB: 325 mg | ORAL | Qty: 1

## 2012-03-02 MED FILL — INSULIN REGULAR HUMAN 100 UNIT/ML INJECTION: 100 unit/mL | INTRAMUSCULAR | Qty: 1

## 2012-03-02 MED FILL — SODIUM CHLORIDE 0.9 % IV: INTRAVENOUS | Qty: 1000

## 2012-03-02 MED FILL — NITROSTAT 0.4 MG SUBLINGUAL TABLET: 0.4 mg | SUBLINGUAL | Qty: 1

## 2012-03-02 MED FILL — DIPHENHYDRAMINE HCL 50 MG/ML IJ SOLN: 50 mg/mL | INTRAMUSCULAR | Qty: 1

## 2012-03-02 MED FILL — BD POSIFLUSH NORMAL SALINE 0.9 % INJECTION SYRINGE: INTRAMUSCULAR | Qty: 10

## 2012-03-02 NOTE — ED Notes (Signed)
I have reviewed discharge instructions with the patient.  The patient verbalized understanding. Patient armband removed and shredded.

## 2012-03-02 NOTE — ED Notes (Signed)
Dr. Lapetina bedside to discuss lab results and follow up care with pt. Pt verbalizes understanding.

## 2012-05-28 MED ORDER — CARISOPRODOL 350 MG TAB
350 mg | ORAL_TABLET | Freq: Four times a day (QID) | ORAL | Status: DC
Start: 2012-05-28 — End: 2014-06-07

## 2012-05-28 MED ORDER — TRAMADOL 50 MG TAB
50 mg | ORAL_TABLET | Freq: Four times a day (QID) | ORAL | Status: DC | PRN
Start: 2012-05-28 — End: 2014-06-07

## 2012-05-28 NOTE — ED Notes (Signed)
I have reviewed discharge instructions with the patient.  The patient verbalized understanding. Patient armband removed and shredded
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
## 2013-01-14 LAB — D-DIMER, QUANTITATIVE: D-Dimer, Quant: 0.41 ug/ml(FEU) (ref <0.46)

## 2013-01-14 LAB — METABOLIC PANEL, COMPREHENSIVE
A-G Ratio: 0.8 (ref 0.8–1.7)
ALT (SGPT): 30 U/L (ref 12.0–78.0)
AST (SGOT): 17 U/L (ref 15–37)
Albumin: 3.5 g/dL (ref 3.4–5.0)
Alk. phosphatase: 53 U/L (ref 50–136)
Anion gap: 9 mmol/L (ref 3.0–18)
BUN/Creatinine ratio: 16 (ref 12–20)
BUN: 19 MG/DL — ABNORMAL HIGH (ref 7.0–18)
Bilirubin, total: 0.4 MG/DL (ref 0.2–1.0)
CO2: 26 mmol/L (ref 21–32)
Calcium: 9.2 MG/DL (ref 8.5–10.1)
Chloride: 100 mmol/L (ref 100–108)
Creatinine: 1.18 MG/DL (ref 0.6–1.3)
GFR est AA: >60 mL/min/{1.73_m2} (ref >60)
GFR est non-AA: >60 mL/min/{1.73_m2} (ref >60)
Globulin: 4.4 g/dL — ABNORMAL HIGH (ref 2.0–4.0)
Glucose: 357 mg/dL — ABNORMAL HIGH (ref 74–99)
Potassium: 4.2 mmol/L (ref 3.5–5.5)
Protein, total: 7.9 g/dL (ref 6.4–8.2)
Sodium: 135 mmol/L — ABNORMAL LOW (ref 136–145)

## 2013-01-14 LAB — DRUG SCREEN, URINE
AMPHETAMINES: NEGATIVE
BARBITURATES: NEGATIVE
BENZODIAZEPINES: NEGATIVE
COCAINE: NEGATIVE
METHADONE: NEGATIVE
OPIATES: POSITIVE — AB
PCP(PHENCYCLIDINE): NEGATIVE
THC (TH-CANNABINOL): NEGATIVE

## 2013-01-14 LAB — CBC WITH AUTOMATED DIFF
ABS. BASOPHILS: 0 10*3/uL (ref 0.0–0.06)
ABS. EOSINOPHILS: 0.2 10*3/uL (ref 0.0–0.4)
ABS. LYMPHOCYTES: 2.3 10*3/uL (ref 0.9–3.6)
ABS. MONOCYTES: 0.8 10*3/uL (ref 0.05–1.2)
ABS. NEUTROPHILS: 4.3 10*3/uL (ref 1.8–8.0)
BASOPHILS: 1 % (ref 0–2)
EOSINOPHILS: 3 % (ref 0–5)
HCT: 45.2 % (ref 36.0–48.0)
HGB: 15.8 g/dL (ref 13.0–16.0)
LYMPHOCYTES: 30 % (ref 21–52)
MCH: 30 PG (ref 24.0–34.0)
MCHC: 35 g/dL (ref 31.0–37.0)
MCV: 85.9 FL (ref 74.0–97.0)
MONOCYTES: 10 % (ref 3–10)
MPV: 9.3 FL (ref 9.2–11.8)
NEUTROPHILS: 56 % (ref 40–73)
PLATELET: 288 10*3/uL (ref 135–420)
RBC: 5.26 M/uL (ref 4.70–5.50)
RDW: 12.2 % (ref 11.6–14.5)
WBC: 7.6 10*3/uL (ref 4.6–13.2)

## 2013-01-14 LAB — URINALYSIS W/ RFLX MICROSCOPIC
Bilirubin: NEGATIVE
Blood: NEGATIVE
Glucose: >1000 mg/dL — AB
Ketone: NEGATIVE mg/dL
Leukocyte Esterase: NEGATIVE
Nitrites: NEGATIVE
Protein: NEGATIVE mg/dL
Specific gravity: 1.015 (ref 1.003–1.030)
Urobilinogen: 1 EU/dL (ref 0.2–1.0)
pH (UA): 6 (ref 5.0–8.0)

## 2013-01-14 LAB — INFLUENZA A & B AG (RAPID TEST)
Influenza A Antigen: NEGATIVE
Influenza B Antigen: NEGATIVE

## 2013-01-14 LAB — GLUCOSE, POC
Glucose (POC): 330 mg/dL — ABNORMAL HIGH (ref 70–110)
Glucose (POC): 152 mg/dL — ABNORMAL HIGH (ref 70–110)

## 2013-01-14 LAB — MAGNESIUM: Magnesium: 2.1 mg/dL (ref 1.8–2.4)

## 2013-01-14 LAB — D DIMER: D DIMER: 0.41 ug/ml(FEU) (ref <0.46)

## 2013-01-14 MED ORDER — INSULIN REGULAR HUMAN 100 UNIT/ML INJECTION
100 unit/mL | INTRAMUSCULAR | Status: AC
Start: 2013-01-14 — End: 2013-01-14
  Administered 2013-01-14: 18:00:00 via INTRAVENOUS

## 2013-01-14 MED ORDER — ONDANSETRON (PF) 4 MG/2 ML INJECTION
4 mg/2 mL | Freq: Once | INTRAMUSCULAR | Status: AC
Start: 2013-01-14 — End: 2013-01-14
  Administered 2013-01-14: 17:00:00 via INTRAVENOUS

## 2013-01-14 MED ORDER — SODIUM CHLORIDE 0.9% BOLUS IV
0.9 % | Freq: Once | INTRAVENOUS | Status: AC
Start: 2013-01-14 — End: 2013-01-14
  Administered 2013-01-14: 18:00:00 via INTRAVENOUS

## 2013-01-14 MED ORDER — SODIUM CHLORIDE 0.9% BOLUS IV
0.9 % | Freq: Once | INTRAVENOUS | Status: AC
Start: 2013-01-14 — End: 2013-01-14
  Administered 2013-01-14: 17:00:00 via INTRAVENOUS

## 2013-01-14 MED ORDER — KETOROLAC TROMETHAMINE 15 MG/ML INJECTION
15 mg/mL | INTRAMUSCULAR | Status: AC
Start: 2013-01-14 — End: 2013-01-14
  Administered 2013-01-14: 20:00:00 via INTRAVENOUS

## 2013-01-14 MED ORDER — MORPHINE 2 MG/ML INJECTION
2 mg/mL | Freq: Once | INTRAMUSCULAR | Status: AC
Start: 2013-01-14 — End: 2013-01-14
  Administered 2013-01-14: 17:00:00 via INTRAVENOUS

## 2013-01-14 MED ORDER — OXYCODONE-ACETAMINOPHEN 5 MG-325 MG TAB
5-325 mg | ORAL_TABLET | ORAL | Status: DC | PRN
Start: 2013-01-14 — End: 2014-06-07

## 2013-01-14 MED ORDER — SODIUM CHLORIDE 0.9 % IJ SYRG
INTRAMUSCULAR | Status: DC | PRN
Start: 2013-01-14 — End: 2013-01-14
  Administered 2013-01-14: 18:00:00 via INTRAVENOUS

## 2013-01-14 NOTE — ED Notes (Signed)
Pt states "I feels like I got some kind of infection in my body. It feels like it's squeezing my body up. My legs and feet hurt. I can't sleep. I haven't eaten in five days."

## 2013-01-14 NOTE — ED Provider Notes (Signed)
HPI Comments: 11:42 AM  57 y.o. male presents to the ED C/O worsening BLE and abd pain. Associated sxs include diaphoresis and constipation. Pt states he has not eaten solid food the last 4 days. PMH includes neuropathy, DM, CAD, and chronic pain of L thigh/knee. Pt's PCP is Dr. Cher Nakai. Pt denies Hx of blood clots, abd SHx, tobacco or ETOH use, and any other sxs or complaints    Recorded by Annie Sable, ED Scribe, as dictated by Romero Liner, PA-C     Patient is a 57 y.o. male presenting with leg pain. The history is provided by the patient. No language interpreter was used.   Leg Pain   This is a new problem. The problem occurs constantly. The problem has been gradually worsening. The pain is present in the left upper leg, left lower leg, right upper leg and right lower leg. The quality of the pain is described as constant. The pain is at a severity of 10/10. The pain is severe. He has tried nothing for the symptoms.        Past Medical History   Diagnosis Date   ??? CAD (coronary artery disease)    ??? Diabetes    ??? MI (myocardial infarction)    ??? Hypertension    ??? High cholesterol         Past Surgical History   Procedure Laterality Date   ??? Pr cardiac surg procedure unlist     ??? Hx coronary stent placement           History reviewed. No pertinent family history.     History     Social History   ??? Marital Status: SINGLE     Spouse Name: N/A     Number of Children: N/A   ??? Years of Education: N/A     Occupational History   ??? Not on file.     Social History Main Topics   ??? Smoking status: Never Smoker    ??? Smokeless tobacco: Not on file   ??? Alcohol Use: Yes   ??? Drug Use:    ??? Sexually Active:      Other Topics Concern   ??? Not on file     Social History Narrative   ??? No narrative on file                  ALLERGIES: Review of patient's allergies indicates no known allergies.      Review of Systems   Constitutional: Positive for diaphoresis. Negative for fever and fatigue.   HENT: Negative for sore throat and  rhinorrhea.    Respiratory: Negative for cough and shortness of breath.    Cardiovascular: Negative for chest pain and palpitations.   Gastrointestinal: Positive for abdominal pain and constipation. Negative for nausea, vomiting and diarrhea.   Genitourinary: Negative for dysuria and difficulty urinating.   Musculoskeletal: Positive for myalgias and arthralgias.        BLE   Skin: Negative for color change and rash.   Neurological: Negative for light-headedness and headaches.   All other systems reviewed and are negative.        Filed Vitals:    01/14/13 1445 01/14/13 1500 01/14/13 1515 01/14/13 1530   BP: 111/62 135/76 130/75 117/71   Pulse:       Temp:       Resp:       Height:       Weight:       SpO2: 100%  100% 100% 98%            Physical Exam   Nursing note and vitals reviewed.  Constitutional: He appears well-developed and well-nourished. No distress.   HENT:   Head: Normocephalic and atraumatic.   Right Ear: Tympanic membrane, external ear and ear canal normal.   Left Ear: Tympanic membrane, external ear and ear canal normal.   Nose: Nose normal.   Mouth/Throat: Uvula is midline, oropharynx is clear and moist and mucous membranes are normal.   Eyes: Conjunctivae and EOM are normal. Pupils are equal, round, and reactive to light.   Neck: Trachea normal, normal range of motion and full passive range of motion without pain. Neck supple.   Cardiovascular: Normal rate, regular rhythm, normal heart sounds and normal pulses.    Pulmonary/Chest: Effort normal and breath sounds normal. No respiratory distress. He has no wheezes. He has no rales.   Abdominal: Soft. Normal appearance and bowel sounds are normal. There is no tenderness. There is no rebound and no guarding.   Musculoskeletal: Normal range of motion.   Neurological: He is alert. He has normal strength and normal reflexes.   Skin: Skin is warm and dry. He is not diaphoretic.   Psychiatric: He has a normal mood and affect. His speech is normal and behavior  is normal. Judgment normal.          RESULTS    EKG FINDING:  11:48 AM  Normal Sinus Rhythm; rate: 71 bpm; Read by Romero Liner, PA-C at 10:11 PM    EKG, 12 LEAD, INITIAL    (Results Pending)       Labs Reviewed   METABOLIC PANEL, COMPREHENSIVE - Abnormal; Notable for the following:     Sodium 135 (*)     Glucose 357 (*)     BUN 19 (*)     Globulin 4.4 (*)     All other components within normal limits   URINALYSIS W/ RFLX MICROSCOPIC - Abnormal; Notable for the following:     Glucose >1000 (*)     All other components within normal limits   DRUG SCREEN, URINE - Abnormal; Notable for the following:     OPIATES POSITIVE (*)     All other components within normal limits   GLUCOSE, POC - Abnormal; Notable for the following:     Glucose (POC) 330 (*)     All other components within normal limits   GLUCOSE, POC - Abnormal; Notable for the following:     Glucose (POC) 152 (*)     All other components within normal limits   INFLUENZA A & B AG (RAPID TEST)   CBC WITH AUTOMATED DIFF   D DIMER   MAGNESIUM   POC GLUCOSE       Recent Results (from the past 12 hour(s))   EKG, 12 LEAD, INITIAL    Collection Time     01/14/13 10:06 AM       Result Value Range    Ventricular Rate 71      Atrial Rate 71      P-R Interval 206      QRS Duration 70      Q-T Interval 384      QTC Calculation (Bezet) 417      Calculated P Axis 74      Calculated R Axis 63      Calculated T Axis 61      Diagnosis        Value: Normal  sinus rhythm      Normal ECG      When compared with ECG of 01-Mar-2012 20:19,      No significant change was found   GLUCOSE, POC    Collection Time     01/14/13 10:35 AM       Result Value Range    Glucose (POC) 330 (*) 70 - 110 mg/dL   CBC WITH AUTOMATED DIFF    Collection Time     01/14/13 11:50 AM       Result Value Range    WBC 7.6  4.6 - 13.2 K/uL    RBC 5.26  4.70 - 5.50 M/uL    HGB 15.8  13.0 - 16.0 g/dL    HCT 16.1  09.6 - 04.5 %    MCV 85.9  74.0 - 97.0 FL    MCH 30.0  24.0 - 34.0 PG    MCHC 35.0  31.0 - 37.0  g/dL    RDW 40.9  81.1 - 91.4 %    PLATELET 288  135 - 420 K/uL    MPV 9.3  9.2 - 11.8 FL    NEUTROPHILS 56  40 - 73 %    LYMPHOCYTES 30  21 - 52 %    MONOCYTES 10  3 - 10 %    EOSINOPHILS 3  0 - 5 %    BASOPHILS 1  0 - 2 %    ABS. NEUTROPHILS 4.3  1.8 - 8.0 K/UL    ABS. LYMPHOCYTES 2.3  0.9 - 3.6 K/UL    ABS. MONOCYTES 0.8  0.05 - 1.2 K/UL    ABS. EOSINOPHILS 0.2  0.0 - 0.4 K/UL    ABS. BASOPHILS 0.0  0.0 - 0.06 K/UL    DF AUTOMATED     METABOLIC PANEL, COMPREHENSIVE    Collection Time     01/14/13 11:50 AM       Result Value Range    Sodium 135 (*) 136 - 145 mmol/L    Potassium 4.2  3.5 - 5.5 mmol/L    Chloride 100  100 - 108 mmol/L    CO2 26  21 - 32 mmol/L    Anion gap 9  3.0 - 18 mmol/L    Glucose 357 (*) 74 - 99 mg/dL    BUN 19 (*) 7.0 - 18 MG/DL    Creatinine 7.82  0.6 - 1.3 MG/DL    BUN/Creatinine ratio 16  12 - 20      GFR est AA >60  >60 ml/min/1.43m2    GFR est non-AA >60  >60 ml/min/1.63m2    Calcium 9.2  8.5 - 10.1 MG/DL    Bilirubin, total 0.4  0.2 - 1.0 MG/DL    ALT 30  95.6 - 21.3 U/L    AST 17  15 - 37 U/L    Alk. phosphatase 53  50 - 136 U/L    Protein, total 7.9  6.4 - 8.2 g/dL    Albumin 3.5  3.4 - 5.0 g/dL    Globulin 4.4 (*) 2.0 - 4.0 g/dL    A-G Ratio 0.8  0.8 - 1.7     D DIMER    Collection Time     01/14/13 11:50 AM       Result Value Range    D DIMER 0.41  <0.46 ug/ml(FEU)   MAGNESIUM    Collection Time     01/14/13 11:50 AM       Result Value Range  Magnesium 2.1  1.8 - 2.4 mg/dL   INFLUENZA A & B AG (RAPID TEST)    Collection Time     01/14/13 12:45 PM       Result Value Range    Influenza A Antigen NEGATIVE   NEGATIVE    Influenza B Antigen NEGATIVE   NEGATIVE   URINALYSIS W/ RFLX MICROSCOPIC    Collection Time     01/14/13  2:50 PM       Result Value Range    Color STRAW      Appearance CLEAR      Specific gravity 1.015  1.003 - 1.030      pH (UA) 6.0  5.0 - 8.0      Protein NEGATIVE   NEGATIVE mg/dL    Glucose >1610 (*) NEGATIVE mg/dL    Ketone NEGATIVE   NEGATIVE mg/dL    Bilirubin  NEGATIVE   NEGATIVE    Blood NEGATIVE   NEGATIVE    Urobilinogen 1.0  0.2 - 1.0 EU/dL    Nitrites NEGATIVE   NEGATIVE    Leukocyte Esterase NEGATIVE   NEGATIVE   DRUG SCREEN, URINE    Collection Time     01/14/13  2:50 PM       Result Value Range    BENZODIAZEPINE NEGATIVE   NEGATIVE    BARBITURATES NEGATIVE   NEGATIVE    THC (TH-CANNABINOL) NEGATIVE   NEGATIVE    OPIATES POSITIVE (*) NEGATIVE    PCP(PHENCYCLIDINE) NEGATIVE   NEGATIVE    COCAINE NEGATIVE   NEGATIVE    AMPHETAMINE NEGATIVE   NEGATIVE    METHADONE NEGATIVE   NEGATIVE    HDSCOM (NOTE)     GLUCOSE, POC    Collection Time     01/14/13  2:53 PM       Result Value Range    Glucose (POC) 152 (*) 70 - 110 mg/dL        MDM     Differential Diagnosis; Clinical Impression; Plan:     DDX: probable viral illness but will check for electrolyte abnormalities, infection  Amount and/or Complexity of Data Reviewed:   Clinical lab tests:  Ordered and reviewed  Tests in the medicine section of the CPT??:  Ordered and reviewed (EKG)  Discussion of test results with the performing providers:  No   Decide to obtain previous medical records or to obtain history from someone other than the patient:  No   Obtain history from someone other than the patient:  No   Review and summarize past medical records:  No   Discuss the patient with another provider:  No   Independant visualization of image, tracing, or specimen:  Yes (EKG)        MEDICATIONS GIVEN:  Medications   sodium chloride (NS) flush 5-10 mL (10 mL IntraVENous Given 01/14/13 1331)   sodium chloride 0.9 % bolus infusion 1,000 mL (0 mL IntraVENous IV Completed 01/14/13 1332)   morphine injection 4 mg (4 mg IntraVENous Given 01/14/13 1239)   ondansetron (ZOFRAN) injection 4 mg (4 mg IntraVENous Given 01/14/13 1238)   insulin regular (NOVOLIN R, HUMULIN R) injection 8 Units (8 Units IntraVENous Given 01/14/13 1330)   sodium chloride 0.9 % bolus infusion 1,000 mL (0 mL IntraVENous IV Completed 01/14/13 1536)   ketorolac  (TORADOL) injection 30 mg (30 mg IntraVENous Given 01/14/13 1534)          Procedures      3:41 PM   Densel  Pekar's  results have been reviewed with him.  He has been counseled regarding his diagnosis, treatment, and plan.  He verbally conveys understanding and agreement of the signs, symptoms, diagnosis, treatment and prognosis and additionally agrees to follow up as discussed.  He also agrees with the care-plan and conveys that all of his questions have been answered.  I have also provided discharge instructions for him that include: educational information regarding their diagnosis and treatment, and list of reasons why they would want to return to the ED prior to their follow-up appointment, should his condition change.    CLINICAL IMPRESSION  1. Chronic leg pain, left        PLAN:  1. Percocet  2. F/U with PCP  Return if sxs worsen    Recorded by Annie Sable, ED Scribe, as dictated by Romero Liner, PA-C

## 2013-01-14 NOTE — ED Notes (Signed)
Patient armband removed and shredded I have reviewed discharge instructions with the patient and spouse.  The patient and spouse verbalized understanding. Explained to patient that cannot drive while taking prescribed medication. Patient verbalized understanding.

## 2013-01-14 NOTE — ED Notes (Signed)
Family member at bedside and in waiting room to take patient home.

## 2013-01-14 NOTE — ED Notes (Addendum)
Pt c/o increased weight loss recently. Pt states asked provider for pain medication, not given at this time.

## 2013-01-14 NOTE — ED Notes (Signed)
FSBS 157. VSS. UA obtained and sent to lab.

## 2013-01-14 NOTE — ED Notes (Signed)
Patient given verbal education for pain medicine and possible side effects and safety. Patient verbalized understanding.

## 2013-01-14 NOTE — ED Notes (Signed)
Ice water given per patient request. Continues to c/o diabetic neuropathy. States " I take Nuerontin and Tramadol for pain, but Tramadol doesn't do anything."  PA Dearnley aware.

## 2013-01-14 NOTE — ED Notes (Signed)
Urinal for void.

## 2013-01-15 LAB — EKG, 12 LEAD, INITIAL
Atrial Rate: 71 {beats}/min
Calculated P Axis: 74 degrees
Calculated R Axis: 63 degrees
Calculated T Axis: 61 degrees
Diagnosis: NORMAL
P-R Interval: 206 ms
Q-T Interval: 384 ms
QRS Duration: 70 ms
QTC Calculation (Bezet): 417 ms
Ventricular Rate: 71 {beats}/min

## 2013-01-17 NOTE — ED Provider Notes (Signed)
I was personally available for consultation in the emergency department.  I have reviewed the chart and agree with the documentation recorded by the MLP, including the assessment, treatment plan, and disposition.  Korene Dula J Philicia Heyne, MD

## 2013-06-13 NOTE — ED Notes (Addendum)
Pt C/O mid sternal chest pain and vomiting for the last 4 days. Pt is also diabetic and C/O left leg swelling.

## 2013-06-13 NOTE — ED Provider Notes (Signed)
HPI Comments: 8:10 PM  57 y.o. male presents to ED C/O abd and sharp chest pain onset 4 days ago. Associated sxs include cough,slight fever, nausea and vomiting.  He explains that he is unable to keep any food down. Pt's CP is exacerbated by breathing and movement. Pt has been taking Fentanyl but took it off two days ago. He explains he could not "take the pain". Pt recently found out he had an enlarged prostate. He has dysuria and pain to the touch of his prostate. Pt also complains that he has a nasty taste in his mouth. He think she may have an infection. Pt denies any other sxs or complaints.     (-) ETOH  (-) Smoke    Patient is a 57 y.o. male presenting with chest pain and vomiting.   Chest Pain (Angina)   Associated symptoms include abdominal pain, nausea and vomiting. Pertinent negatives include no cough, no fever, no headaches, no palpitations and no shortness of breath.   Vomiting   Associated symptoms include abdominal pain. Pertinent negatives include no fever, no diarrhea, no headaches, no arthralgias, no myalgias, no cough and no headaches.        Past Medical History   Diagnosis Date   ??? CAD (coronary artery disease)    ??? Diabetes    ??? MI (myocardial infarction)    ??? Hypertension    ??? High cholesterol    ??? Ill-defined condition      prostate problems        Past Surgical History   Procedure Laterality Date   ??? Pr cardiac surg procedure unlist     ??? Hx coronary stent placement           History reviewed. No pertinent family history.     History     Social History   ??? Marital Status: SINGLE     Spouse Name: N/A     Number of Children: N/A   ??? Years of Education: N/A     Occupational History   ??? Not on file.     Social History Main Topics   ??? Smoking status: Former Smoker   ??? Smokeless tobacco: Not on file   ??? Alcohol Use: Yes   ??? Drug Use: Not on file   ??? Sexually Active: Not on file     Other Topics Concern   ??? Not on file     Social History Narrative   ??? No narrative on file                   ALLERGIES: Review of patient's allergies indicates no known allergies.      Review of Systems   Constitutional: Negative for fever and fatigue.   HENT: Negative for sore throat and rhinorrhea.    Respiratory: Negative for cough and shortness of breath.    Cardiovascular: Positive for chest pain. Negative for palpitations.   Gastrointestinal: Positive for nausea, vomiting and abdominal pain. Negative for diarrhea.   Genitourinary: Positive for testicular pain. Negative for dysuria and difficulty urinating.   Musculoskeletal: Negative for myalgias and arthralgias.   Skin: Negative for color change and rash.   Neurological: Negative for light-headedness and headaches.   All other systems reviewed and are negative.        Filed Vitals:    06/13/13 2315 06/13/13 2330 06/13/13 2345 06/14/13 0000   BP: 149/74 159/80 159/88 160/84   Pulse: 65 65 62 61   Temp:  Resp: 17 19 19 14    Height:       Weight:       SpO2:                Physical Exam   Nursing note and vitals reviewed.  Constitutional: He is oriented to person, place, and time. He appears well-developed. No distress.   HENT:   Head: Normocephalic.   Mouth/Throat: Oropharynx is clear and moist.   Eyes: Pupils are equal, round, and reactive to light.   Neck: Normal range of motion.   Cardiovascular: Normal rate, regular rhythm, normal heart sounds and intact distal pulses.    Pulmonary/Chest: Effort normal and breath sounds normal. No respiratory distress. He has no wheezes. He has no rales.   Abdominal: Soft. Bowel sounds are normal. He exhibits no distension. There is no tenderness. There is no rebound and no guarding.   Musculoskeletal: Normal range of motion. He exhibits no edema and no tenderness.   Neurological: He is alert and oriented to person, place, and time. No cranial nerve deficit. He exhibits normal muscle tone. Coordination normal.   Skin: Skin is warm and dry.   Psychiatric: He has a normal mood and affect.        RESULTS    EKG FINDING:  7:45  PM  Normal Sinus Rhythm ; rate: 76 bpm; Other findings: Normal ECG Read by Bailey Mech, MD at 19:45 PM      9:51 PM RADIOLOGY RESULT:  X-Ray of chest shows no acute process.  Pending review by radiologist.  Written by Duwaine Maxin, ED Scribe, as dictated by Bailey Mech, MD.     EKG interpretation: (Preliminary)  Sinus bradycardia, rate 59 bpm, otherwise normal  EKG read by Bailey Mech, MD at 10:03 PM       Labs Reviewed   CBC WITH AUTOMATED DIFF - Abnormal; Notable for the following:     MONOCYTES 14 (*)     ABS. BASOPHILS 0.1 (*)     All other components within normal limits   METABOLIC PANEL, COMPREHENSIVE - Abnormal; Notable for the following:     Glucose 216 (*)     Alk. phosphatase 29 (*)     All other components within normal limits   CARDIAC PANEL,(CK, CKMB & TROPONIN) - Abnormal; Notable for the following:     CK - MB <0.5 (*)     All other components within normal limits   URINALYSIS W/ RFLX MICROSCOPIC - Abnormal; Notable for the following:     Glucose 250 (*)     Urobilinogen 4.0 (*)     All other components within normal limits   CARDIAC PANEL,(CK, CKMB & TROPONIN) - Abnormal; Notable for the following:     CK - MB <0.5 (*)     All other components within normal limits   LIPASE   D DIMER     Recent Results (from the past 12 hour(s))   EKG, 12 LEAD, INITIAL    Collection Time     06/13/13  7:41 PM       Result Value Range    Ventricular Rate 76      Atrial Rate 76      P-R Interval 200      QRS Duration 76      Q-T Interval 382      QTC Calculation (Bezet) 429      Calculated P Axis 62      Calculated R Axis 16  Calculated T Axis 55      Diagnosis        Value: Normal sinus rhythm      Normal ECG      When compared with ECG of 14-Jan-2013 10:06,      No significant change was found   CBC WITH AUTOMATED DIFF    Collection Time     06/13/13  7:53 PM       Result Value Range    WBC 6.5  4.6 - 13.2 K/uL    RBC 4.89  4.70 - 5.50 M/uL    HGB 14.5  13.0 - 16.0 g/dL    HCT 16.1  09.6 - 04.5 %     MCV 85.7  74.0 - 97.0 FL    MCH 29.7  24.0 - 34.0 PG    MCHC 34.6  31.0 - 37.0 g/dL    RDW 40.9  81.1 - 91.4 %    PLATELET 256  135 - 420 K/uL    MPV 9.4  9.2 - 11.8 FL    NEUTROPHILS 46  40 - 73 %    LYMPHOCYTES 36  21 - 52 %    MONOCYTES 14 (*) 3 - 10 %    EOSINOPHILS 3  0 - 5 %    BASOPHILS 1  0 - 2 %    ABS. NEUTROPHILS 3.1  1.8 - 8.0 K/UL    ABS. LYMPHOCYTES 2.3  0.9 - 3.6 K/UL    ABS. MONOCYTES 0.9  0.05 - 1.2 K/UL    ABS. EOSINOPHILS 0.2  0.0 - 0.4 K/UL    ABS. BASOPHILS 0.1 (*) 0.0 - 0.06 K/UL    DF AUTOMATED     METABOLIC PANEL, COMPREHENSIVE    Collection Time     06/13/13  7:53 PM       Result Value Range    Sodium 140  136 - 145 mmol/L    Potassium 3.8  3.5 - 5.5 mmol/L    Chloride 103  100 - 108 mmol/L    CO2 27  21 - 32 mmol/L    Anion gap 10  3.0 - 18 mmol/L    Glucose 216 (*) 74 - 99 mg/dL    BUN 17  7.0 - 18 MG/DL    Creatinine 7.82  0.6 - 1.3 MG/DL    BUN/Creatinine ratio 15  12 - 20      GFR est AA >60  >60 ml/min/1.24m2    GFR est non-AA >60  >60 ml/min/1.34m2    Calcium 9.2  8.5 - 10.1 MG/DL    Bilirubin, total 0.3  0.2 - 1.0 MG/DL    ALT 31  95.6 - 21.3 U/L    AST 17  15 - 37 U/L    Alk. phosphatase 29 (*) 45 - 117 U/L    Protein, total 7.8  6.4 - 8.2 g/dL    Albumin 3.9  3.4 - 5.0 g/dL    Globulin 3.9  2.0 - 4.0 g/dL    A-G Ratio 1.0  0.8 - 1.7     LIPASE    Collection Time     06/13/13  7:53 PM       Result Value Range    Lipase 79  73 - 393 U/L   D DIMER    Collection Time     06/13/13  7:53 PM       Result Value Range    D DIMER 0.32  <0.46 ug/ml(FEU)   CARDIAC PANEL,(CK,  CKMB & TROPONIN)    Collection Time     06/13/13  8:00 PM       Result Value Range    CK 105  39 - 308 U/L    CK - MB <0.5 (*) 0.5 - 3.6 ng/ml    CK-MB Index CANNOT BE CALCULATED  0.0 - 4.0 %    Troponin-I, Qt. <0.02  0.00 - 0.06 NG/ML   EKG, 12 LEAD, SUBSEQUENT    Collection Time     06/13/13 10:03 PM       Result Value Range    Ventricular Rate 59      Atrial Rate 59      P-R Interval 188      QRS Duration 78      Q-T  Interval 406      QTC Calculation (Bezet) 401      Calculated P Axis 32      Calculated R Axis 7      Calculated T Axis 20      Diagnosis        Value: Sinus bradycardia      Nonspecific T wave abnormality      Abnormal ECG      When compared with ECG of 13-Jun-2013 19:41,      No significant change was found   CARDIAC PANEL,(CK, CKMB & TROPONIN)    Collection Time     06/13/13 10:10 PM       Result Value Range    CK 94  39 - 308 U/L    CK - MB <0.5 (*) 0.5 - 3.6 ng/ml    CK-MB Index CANNOT BE CALCULATED  0.0 - 4.0 %    Troponin-I, Qt. <0.02  0.00 - 0.06 NG/ML   URINALYSIS W/ RFLX MICROSCOPIC    Collection Time     06/13/13 10:25 PM       Result Value Range    Color YELLOW      Appearance CLEAR      Specific gravity 1.015  1.003 - 1.030      pH (UA) 7.5  5.0 - 8.0      Protein NEGATIVE   NEG mg/dL    Glucose 308 (*) NEG mg/dL    Ketone NEGATIVE   NEG mg/dL    Bilirubin NEGATIVE   NEG      Blood NEGATIVE   NEG      Urobilinogen 4.0 (*) 0.2 - 1.0 EU/dL    Nitrites NEGATIVE   NEG      Leukocyte Esterase NEGATIVE   NEG           Past Medical History   Diagnosis Date   ??? CAD (coronary artery disease)    ??? Diabetes    ??? MI (myocardial infarction)    ??? Hypertension    ??? High cholesterol    ??? Ill-defined condition      prostate problems      XR ABD ACUTE W 1 V CHEST    Final Result: IMPRESSION: No acute findings in the chest or abdomen             MDM     Differential Diagnosis; Clinical Impression; Plan:     DDx:   Amount and/or Complexity of Data Reviewed:   Clinical lab tests:  Ordered and reviewed  Tests in the radiology section of CPT??:  Ordered and reviewed (Abd x-ray)  Discussion of test results with the performing providers:  No   Decide to  obtain previous medical records or to obtain history from someone other than the patient:  No   Obtain history from someone other than the patient:  No   Review and summarize past medical records:  No   Discuss the patient with another provider:  No   Independant visualization of  image, tracing, or specimen:  Yes        MEDICATIONS  Medications   sodium chloride 0.9 % bolus infusion 1,000 mL (0 mL IntraVENous IV Completed 06/13/13 2234)   morphine injection 4 mg (4 mg IntraVENous Given 06/13/13 2125)   ondansetron (ZOFRAN) injection 4 mg (4 mg IntraVENous Given 06/13/13 2125)   hydromorphone (PF) (DILAUDID) injection 1 mg (1 mg IntraVENous Given 06/13/13 2234)         Procedures      PROGRESS NOTE:  12:13 AM  Pt has been re-examined by Bailey Mech, MD. Pt is feeling better, no complaints at discharge.   Written by Broadus John, ED Scribe, as dictated by Bailey Mech, MD.     DISCHARGE NOTE:  12:13 AM   Yafet Deuser's  results have been reviewed with him.  He has been counseled regarding his diagnosis, treatment, and plan.  He verbally conveys understanding and agreement of the signs, symptoms, diagnosis, treatment and prognosis and additionally agrees to follow up as discussed.  He also agrees with the care-plan and conveys that all of his questions have been answered.  I have also provided discharge instructions for him that include: educational information regarding their diagnosis and treatment, and list of reasons why they would want to return to the ED prior to their follow-up appointment, should his condition change.   ________________________________________________________________________  CLINICAL IMPRESSION    1. Narcotic withdrawal    2. Abdominal pain         After Visit Plan:  1. Dx'd home  2. Rx for Percocet  3. F/U with pain management next week as scheduled   Return to ED if Sxs worsen.    Written by Duwaine Maxin, ED Scribe, as dictated by Bailey Mech, MD.

## 2013-06-13 NOTE — ED Notes (Signed)
Patient reports non compliance with Fentaynl patch, Promethazine, ASA, and Atenolol. Patient states, "I didn't take them, it's too much. I wasn't sure which one of those medications were making me sick."

## 2013-06-14 LAB — D-DIMER, QUANTITATIVE: D-Dimer, Quant: 0.32 ug/ml(FEU) (ref <0.46)

## 2013-06-14 LAB — CBC WITH AUTOMATED DIFF
ABS. BASOPHILS: 0.1 10*3/uL — ABNORMAL HIGH (ref 0.0–0.06)
ABS. EOSINOPHILS: 0.2 10*3/uL (ref 0.0–0.4)
ABS. LYMPHOCYTES: 2.3 10*3/uL (ref 0.9–3.6)
ABS. MONOCYTES: 0.9 10*3/uL (ref 0.05–1.2)
ABS. NEUTROPHILS: 3.1 10*3/uL (ref 1.8–8.0)
BASOPHILS: 1 % (ref 0–2)
EOSINOPHILS: 3 % (ref 0–5)
HCT: 41.9 % (ref 36.0–48.0)
HGB: 14.5 g/dL (ref 13.0–16.0)
LYMPHOCYTES: 36 % (ref 21–52)
MCH: 29.7 PG (ref 24.0–34.0)
MCHC: 34.6 g/dL (ref 31.0–37.0)
MCV: 85.7 FL (ref 74.0–97.0)
MONOCYTES: 14 % — ABNORMAL HIGH (ref 3–10)
MPV: 9.4 FL (ref 9.2–11.8)
NEUTROPHILS: 46 % (ref 40–73)
PLATELET: 256 10*3/uL (ref 135–420)
RBC: 4.89 M/uL (ref 4.70–5.50)
RDW: 12.5 % (ref 11.6–14.5)
WBC: 6.5 10*3/uL (ref 4.6–13.2)

## 2013-06-14 LAB — CARDIAC PANEL,(CK, CKMB & TROPONIN)
CK - MB: <0.5 ng/ml — ABNORMAL LOW (ref 0.5–3.6)
CK - MB: <0.5 ng/ml — ABNORMAL LOW (ref 0.5–3.6)
CK: 105 U/L (ref 39–308)
CK: 94 U/L (ref 39–308)
Troponin-I, QT: <0.02 NG/ML (ref 0.00–0.06)
Troponin-I, QT: <0.02 NG/ML (ref 0.00–0.06)

## 2013-06-14 LAB — METABOLIC PANEL, COMPREHENSIVE
A-G Ratio: 1 (ref 0.8–1.7)
ALT (SGPT): 31 U/L (ref 12.0–78.0)
AST (SGOT): 17 U/L (ref 15–37)
Albumin: 3.9 g/dL (ref 3.4–5.0)
Alk. phosphatase: 29 U/L — ABNORMAL LOW (ref 45–117)
Anion gap: 10 mmol/L (ref 3.0–18)
BUN/Creatinine ratio: 15 (ref 12–20)
BUN: 17 MG/DL (ref 7.0–18)
Bilirubin, total: 0.3 MG/DL (ref 0.2–1.0)
CO2: 27 mmol/L (ref 21–32)
Calcium: 9.2 MG/DL (ref 8.5–10.1)
Chloride: 103 mmol/L (ref 100–108)
Creatinine: 1.12 MG/DL (ref 0.6–1.3)
GFR est AA: >60 mL/min/{1.73_m2} (ref >60)
GFR est non-AA: >60 mL/min/{1.73_m2} (ref >60)
Globulin: 3.9 g/dL (ref 2.0–4.0)
Glucose: 216 mg/dL — ABNORMAL HIGH (ref 74–99)
Potassium: 3.8 mmol/L (ref 3.5–5.5)
Protein, total: 7.8 g/dL (ref 6.4–8.2)
Sodium: 140 mmol/L (ref 136–145)

## 2013-06-14 LAB — URINALYSIS W/ RFLX MICROSCOPIC
Bilirubin: NEGATIVE
Blood: NEGATIVE
Glucose: 250 mg/dL — AB
Ketone: NEGATIVE mg/dL
Leukocyte Esterase: NEGATIVE
Nitrites: NEGATIVE
Protein: NEGATIVE mg/dL
Specific gravity: 1.015 (ref 1.003–1.030)
Urobilinogen: 4 EU/dL — ABNORMAL HIGH (ref 0.2–1.0)
pH (UA): 7.5 (ref 5.0–8.0)

## 2013-06-14 LAB — LIPASE: Lipase: 79 U/L (ref 73–393)

## 2013-06-14 LAB — D DIMER: D DIMER: 0.32 ug/ml(FEU) (ref <0.46)

## 2013-06-14 MED ORDER — OXYCODONE-ACETAMINOPHEN 5 MG-325 MG TAB
5-325 mg | ORAL_TABLET | ORAL | Status: DC | PRN
Start: 2013-06-14 — End: 2014-06-07

## 2013-06-14 MED ORDER — MORPHINE 4 MG/ML SYRINGE
4 mg/mL | INTRAMUSCULAR | Status: AC
Start: 2013-06-14 — End: 2013-06-13
  Administered 2013-06-14: 01:00:00 via INTRAVENOUS

## 2013-06-14 MED ORDER — SODIUM CHLORIDE 0.9% BOLUS IV
0.9 % | Freq: Once | INTRAVENOUS | Status: AC
Start: 2013-06-14 — End: 2013-06-13
  Administered 2013-06-14: 01:00:00 via INTRAVENOUS

## 2013-06-14 MED ORDER — HYDROMORPHONE (PF) 1 MG/ML IJ SOLN
1 mg/mL | INTRAMUSCULAR | Status: AC
Start: 2013-06-14 — End: 2013-06-13
  Administered 2013-06-14: 03:00:00 via INTRAVENOUS

## 2013-06-14 MED ADMIN — ondansetron (ZOFRAN) injection 4 mg: INTRAVENOUS | @ 01:00:00 | NDC 00409475503

## 2013-06-14 NOTE — ED Notes (Signed)
I have reviewed discharge instructions with the patient.  The patient and spouse verbalized understanding.Patient armband removed and shredded

## 2013-06-15 LAB — EKG, 12 LEAD, INITIAL
Atrial Rate: 76 {beats}/min
Calculated P Axis: 62 degrees
Calculated R Axis: 16 degrees
Calculated T Axis: 55 degrees
Diagnosis: NORMAL
P-R Interval: 200 ms
Q-T Interval: 382 ms
QRS Duration: 76 ms
QTC Calculation (Bezet): 429 ms
Ventricular Rate: 76 {beats}/min

## 2013-06-15 LAB — EKG, 12 LEAD, SUBSEQUENT
Atrial Rate: 59 {beats}/min
Calculated P Axis: 32 degrees
Calculated R Axis: 7 degrees
Calculated T Axis: 20 degrees
P-R Interval: 188 ms
Q-T Interval: 406 ms
QRS Duration: 78 ms
QTC Calculation (Bezet): 401 ms
Ventricular Rate: 59 {beats}/min

## 2013-08-28 ENCOUNTER — Inpatient Hospital Stay
Admit: 2013-08-28 | Discharge: 2013-08-29 | Disposition: A | Discharge status: Home / Self Care | Payer: MEDICARE | Attending: Family Medicine | Admitting: Family Medicine

## 2013-08-28 DIAGNOSIS — T50901A Poisoning by unspecified drugs, medicaments and biological substances, accidental (unintentional), initial encounter: Secondary | ICD-10-CM

## 2013-08-28 LAB — URINALYSIS W/ RFLX MICROSCOPIC
Bilirubin: NEGATIVE
Blood: NEGATIVE
Glucose: >1000 mg/dL — AB
Ketone: NEGATIVE mg/dL
Leukocyte Esterase: NEGATIVE
Nitrites: NEGATIVE
Protein: NEGATIVE mg/dL
Specific gravity: >1.03 — ABNORMAL HIGH (ref 1.003–1.030)
Urobilinogen: 0.2 EU/dL (ref 0.2–1.0)
pH (UA): 6 (ref 5.0–8.0)

## 2013-08-28 LAB — METABOLIC PANEL, COMPREHENSIVE
A-G Ratio: 0.9 (ref 0.8–1.7)
ALT (SGPT): 36 U/L (ref 16–61)
AST (SGOT): 21 U/L (ref 15–37)
Albumin: 3.5 g/dL (ref 3.4–5.0)
Alk. phosphatase: 25 U/L — ABNORMAL LOW (ref 45–117)
Anion gap: 6 mmol/L (ref 3.0–18)
BUN/Creatinine ratio: 20 (ref 12–20)
BUN: 19 MG/DL — ABNORMAL HIGH (ref 7.0–18)
Bilirubin, total: 0.2 MG/DL (ref 0.2–1.0)
CO2: 31 mmol/L (ref 21–32)
Calcium: 9 MG/DL (ref 8.5–10.1)
Chloride: 106 mmol/L (ref 100–108)
Creatinine: 0.96 MG/DL (ref 0.6–1.3)
GFR est AA: >60 mL/min/{1.73_m2} (ref >60)
GFR est non-AA: >60 mL/min/{1.73_m2} (ref >60)
Globulin: 3.9 g/dL (ref 2.0–4.0)
Glucose: 149 mg/dL — ABNORMAL HIGH (ref 74–99)
Potassium: 3.8 mmol/L (ref 3.5–5.5)
Protein, total: 7.4 g/dL (ref 6.4–8.2)
Sodium: 143 mmol/L (ref 136–145)

## 2013-08-28 LAB — CBC WITH AUTOMATED DIFF
ABS. BASOPHILS: 0 10*3/uL (ref 0.0–0.06)
ABS. EOSINOPHILS: 0.6 10*3/uL — ABNORMAL HIGH (ref 0.0–0.4)
ABS. LYMPHOCYTES: 3 10*3/uL (ref 0.9–3.6)
ABS. MONOCYTES: 0.6 10*3/uL (ref 0.05–1.2)
ABS. NEUTROPHILS: 2.4 10*3/uL (ref 1.8–8.0)
BASOPHILS: 0 % (ref 0–2)
EOSINOPHILS: 9 % — ABNORMAL HIGH (ref 0–5)
HCT: 37.5 % (ref 36.0–48.0)
HGB: 12.5 g/dL — ABNORMAL LOW (ref 13.0–16.0)
LYMPHOCYTES: 45 % (ref 21–52)
MCH: 29.4 PG (ref 24.0–34.0)
MCHC: 33.3 g/dL (ref 31.0–37.0)
MCV: 88.2 FL (ref 74.0–97.0)
MONOCYTES: 10 % (ref 3–10)
MPV: 9.2 FL (ref 9.2–11.8)
NEUTROPHILS: 36 % — ABNORMAL LOW (ref 40–73)
PLATELET: 255 10*3/uL (ref 135–420)
RBC: 4.25 M/uL — ABNORMAL LOW (ref 4.70–5.50)
RDW: 13.3 % (ref 11.6–14.5)
WBC: 6.6 10*3/uL (ref 4.6–13.2)

## 2013-08-28 LAB — DRUG SCREEN, URINE
AMPHETAMINES: NEGATIVE
BARBITURATES: NEGATIVE
BENZODIAZEPINES: NEGATIVE
COCAINE: POSITIVE — AB
METHADONE: NEGATIVE
OPIATES: NEGATIVE
PCP(PHENCYCLIDINE): NEGATIVE
THC (TH-CANNABINOL): NEGATIVE

## 2013-08-28 LAB — CARDIAC PANEL,(CK, CKMB & TROPONIN)
CK - MB: 1.2 ng/ml (ref 0.5–3.6)
CK-MB Index: 0.8 % (ref 0.0–4.0)
CK: 146 U/L (ref 39–308)
Troponin-I, QT: <0.02 NG/ML (ref 0.00–0.06)

## 2013-08-28 LAB — SALICYLATE: Salicylate level: <2.8 MG/DL — ABNORMAL LOW (ref 2.8–20)

## 2013-08-28 LAB — ACETAMINOPHEN: Acetaminophen level: <2 ug/mL — ABNORMAL LOW (ref 10–30)

## 2013-08-28 LAB — ETHYL ALCOHOL: ALCOHOL(ETHYL),SERUM: 5 MG/DL — ABNORMAL HIGH (ref 0–3)

## 2013-08-28 LAB — LIPASE: Lipase: 62 U/L — ABNORMAL LOW (ref 73–393)

## 2013-08-28 MED ORDER — SODIUM CHLORIDE 0.9 % IJ SYRG
INTRAMUSCULAR | Status: DC | PRN
Start: 2013-08-28 — End: 2013-08-29

## 2013-08-28 MED ORDER — NALOXONE 1 MG/ML IJ SYRG(AKA NARCAN)
1 mg/mL | Freq: Once | INTRAMUSCULAR | Status: AC
Start: 2013-08-28 — End: 2013-08-29

## 2013-08-28 MED ORDER — SODIUM CHLORIDE 0.9 % IV
3300 unit- 150 mcg/10 mL | Freq: Once | INTRAVENOUS | Status: AC
Start: 2013-08-28 — End: 2013-08-28
  Administered 2013-08-28: 22:00:00 via INTRAVENOUS

## 2013-08-28 NOTE — Progress Notes (Signed)
Called Karen to send patient for CT of Head
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
## 2013-08-28 NOTE — ED Notes (Signed)
Pt verbalized consent for visitor to come to room.

## 2013-08-28 NOTE — ED Notes (Signed)
Shift change report received from Justice Rocher RN. Pt laying in bed, remains on cardiac monitor, NSR in no acute distress. Pt breathing regular and unlabored. Pt A&O X4, responding appropriately. Pt denies any suicidal or homicidal thoughts at this time.

## 2013-08-28 NOTE — ED Notes (Addendum)
Pt cannot be medically cleared until 10 pm per Dr. Nicki Reaper if pt took Elavil, will need monitoring until at least this period of time.
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
## 2013-08-28 NOTE — ED Provider Notes (Signed)
HPI Comments: 4:19 PM  57 y.o. male presents to the ED via EMS unresponsive. Pt was A&O at 10AM (6.5 hours ago) according to his girlfriend, and then she left the house. When she returned 20 minutes ago, she found him unresponsive with an empty bottle of liquor and a spilled bottle Amitriptyline, so she called EMS. EMS reports the pt would not respond with a sternal rub and was given 2 Narcan. Sugars were 135, and Pulse ox was 95% on room air. PMHx includes CAD, MI, HTN, and DM. Pt is a daily drinker. Fentanyl patches found on legs. EMS found 11 pills sitting next to the empty pill bottle, and returned the pills to the bottle that were filled in May. Pt states "I'm gunna do it again. I'm good," "I took all kinds of pills," and "don't send me back to that house with that woman."    The history is provided by the patient. No language interpreter was used.   Written By Cipriano Mile, ED Scribe, as dictated by Glynn Octave, MD.       Past Medical History   Diagnosis Date   ??? CAD (coronary artery disease)    ??? Diabetes    ??? MI (myocardial infarction)    ??? Hypertension    ??? High cholesterol    ??? Ill-defined condition      prostate problems        Past Surgical History   Procedure Laterality Date   ??? Pr cardiac surg procedure unlist     ??? Hx coronary stent placement           History reviewed. No pertinent family history.     History     Social History   ??? Marital Status: SINGLE     Spouse Name: N/A     Number of Children: N/A   ??? Years of Education: N/A     Occupational History   ??? Not on file.     Social History Main Topics   ??? Smoking status: Former Smoker   ??? Smokeless tobacco: Not on file   ??? Alcohol Use: Yes   ??? Drug Use: Not on file   ??? Sexually Active: Not on file     Other Topics Concern   ??? Not on file     Social History Narrative   ??? No narrative on file                  ALLERGIES: Review of patient's allergies indicates no known allergies.      Review of Systems   Unable to perform ROS: Patient  unresponsive   Psychiatric/Behavioral: Positive for altered mental status.       Filed Vitals:    08/28/13 1745 08/28/13 1800 08/28/13 1815 08/28/13 1830   BP: 149/96 153/87 131/84 133/69   Pulse: 73 72 84 77   Temp:       Resp: 13 15 25 17    Height:       Weight:       SpO2: 100% 100% 100% 100%            Physical Exam   Nursing note and vitals reviewed.  Constitutional: He appears well-developed and well-nourished.  Non-toxic appearance. He does not appear ill. No distress.   Obtunded upon arrival, very difficult to arouse, twiging his eyes with ammonia     at end of exam: he was able to sneeze with gag reflex, responded slowly saying he "took something" and  he is "not going back to that house with that woman;" repeatedly saying "I will do it again, I will do it again."    HENT:   Head: Normocephalic and atraumatic.   No sign of head trauma   Eyes: Right pupil is reactive. Left pupil is reactive.   Eyes closed, 3mm unreactive   Neck: Normal range of motion. Neck supple. Carotid bruit is not present. No tracheal deviation present. No thyromegaly present.   Cardiovascular: Normal rate, regular rhythm, S1 normal, S2 normal and normal heart sounds.  Exam reveals no gallop and no friction rub.    No murmur heard.  Pulmonary/Chest: Effort normal and breath sounds normal. No stridor. No respiratory distress. He has no decreased breath sounds. He has no wheezes. He has no rhonchi. He has no rales. He exhibits no tenderness.   Lungs clear   Abdominal: Soft. He exhibits no distension and no mass. There is no tenderness. There is no rebound, no guarding and no CVA tenderness.   Abdomen soft, no palpable mass   Musculoskeletal: Normal range of motion.        Right ankle: He exhibits no swelling.        Left ankle: He exhibits no swelling.        Right lower leg: He exhibits no swelling.        Left lower leg: He exhibits no swelling.   No extremity swelling   Neurological: He is unresponsive.   Difficult to arouse   Skin:  Skin is warm, dry and intact. He is not diaphoretic. No pallor.   No track marks   Psychiatric: He has a normal mood and affect. His speech is normal and behavior is normal. Judgment and thought content normal.      RESULTS    EKG interpretation: (Preliminary)  Normal sinus rhythm at 92 bpm, no ST changes  EKG read by Lakshya Mcgillicuddy, MD at 16:15.    6:57 PM  RADIOLOGY FINDINGS  Chest X-ray shows NAP  Pending review by Radiologist  Recorded by Erik Obey, ED Scribe, as dictated by Glynn Octave, MD      Labs Reviewed   METABOLIC PANEL, COMPREHENSIVE - Abnormal; Notable for the following:     Glucose 149 (*)     BUN 19 (*)     Alk. phosphatase 25 (*)     All other components within normal limits   CBC WITH AUTOMATED DIFF - Abnormal; Notable for the following:     RBC 4.25 (*)     HGB 12.5 (*)     NEUTROPHILS 36 (*)     EOSINOPHILS 9 (*)     ABS. EOSINOPHILS 0.6 (*)     All other components within normal limits   SALICYLATE - Abnormal; Notable for the following:     SALICYLATE <2.8 (*)     All other components within normal limits   ETHYL ALCOHOL - Abnormal; Notable for the following:     ALCOHOL(ETHYL),SERUM 5 (*)     All other components within normal limits   ACETAMINOPHEN - Abnormal; Notable for the following:     ACETAMINOPHEN <2 (*)     All other components within normal limits   LIPASE - Abnormal; Notable for the following:     Lipase 62 (*)     All other components within normal limits   URINALYSIS W/ RFLX MICROSCOPIC - Abnormal; Notable for the following:     Specific gravity >1.030 (*)  Glucose >1000 (*)     All other components within normal limits   DRUG SCREEN, URINE - Abnormal; Notable for the following:     COCAINE POSITIVE (*)     All other components within normal limits   CARDIAC PANEL,(CK, CKMB & TROPONIN)        Recent Results (from the past 12 hour(s))   EKG, 12 LEAD, INITIAL    Collection Time     08/28/13  4:15 PM       Result Value Range    Ventricular Rate 92      Atrial Rate 92       P-R Interval 188      QRS Duration 78      Q-T Interval 366      QTC Calculation (Bezet) 452      Calculated P Axis 79      Calculated R Axis 29      Calculated T Axis 73      Diagnosis        Value: Normal sinus rhythm      Normal ECG      When compared with ECG of 13-Jun-2013 22:03,      Vent. rate has increased BY  33 BPM   METABOLIC PANEL, COMPREHENSIVE    Collection Time     08/28/13  4:30 PM       Result Value Range    Sodium 143  136 - 145 mmol/L    Potassium 3.8  3.5 - 5.5 mmol/L    Chloride 106  100 - 108 mmol/L    CO2 31  21 - 32 mmol/L    Anion gap 6  3.0 - 18 mmol/L    Glucose 149 (*) 74 - 99 mg/dL    BUN 19 (*) 7.0 - 18 MG/DL    Creatinine 3.08  0.6 - 1.3 MG/DL    BUN/Creatinine ratio 20  12 - 20      GFR est AA >60  >60 ml/min/1.64m2    GFR est non-AA >60  >60 ml/min/1.69m2    Calcium 9.0  8.5 - 10.1 MG/DL    Bilirubin, total 0.2  0.2 - 1.0 MG/DL    ALT 36  16 - 61 U/L    AST 21  15 - 37 U/L    Alk. phosphatase 25 (*) 45 - 117 U/L    Protein, total 7.4  6.4 - 8.2 g/dL    Albumin 3.5  3.4 - 5.0 g/dL    Globulin 3.9  2.0 - 4.0 g/dL    A-G Ratio 0.9  0.8 - 1.7     CBC WITH AUTOMATED DIFF    Collection Time     08/28/13  4:30 PM       Result Value Range    WBC 6.6  4.6 - 13.2 K/uL    RBC 4.25 (*) 4.70 - 5.50 M/uL    HGB 12.5 (*) 13.0 - 16.0 g/dL    HCT 65.7  84.6 - 96.2 %    MCV 88.2  74.0 - 97.0 FL    MCH 29.4  24.0 - 34.0 PG    MCHC 33.3  31.0 - 37.0 g/dL    RDW 95.2  84.1 - 32.4 %    PLATELET 255  135 - 420 K/uL    MPV 9.2  9.2 - 11.8 FL    NEUTROPHILS 36 (*) 40 - 73 %    LYMPHOCYTES 45  21 - 52 %  MONOCYTES 10  3 - 10 %    EOSINOPHILS 9 (*) 0 - 5 %    BASOPHILS 0  0 - 2 %    ABS. NEUTROPHILS 2.4  1.8 - 8.0 K/UL    ABS. LYMPHOCYTES 3.0  0.9 - 3.6 K/UL    ABS. MONOCYTES 0.6  0.05 - 1.2 K/UL    ABS. EOSINOPHILS 0.6 (*) 0.0 - 0.4 K/UL    ABS. BASOPHILS 0.0  0.0 - 0.06 K/UL    DF AUTOMATED     SALICYLATE    Collection Time     08/28/13  4:30 PM       Result Value Range    SALICYLATE <2.8 (*) 2.8 - 20  MG/DL   ETHYL ALCOHOL    Collection Time     08/28/13  4:30 PM       Result Value Range    ALCOHOL(ETHYL),SERUM 5 (*) 0 - 3 MG/DL   ACETAMINOPHEN    Collection Time     08/28/13  4:30 PM       Result Value Range    ACETAMINOPHEN <2 (*) 10 - 30 ug/mL   LIPASE    Collection Time     08/28/13  4:30 PM       Result Value Range    Lipase 62 (*) 73 - 393 U/L   CARDIAC PANEL,(CK, CKMB & TROPONIN)    Collection Time     08/28/13  4:30 PM       Result Value Range    CK 146  39 - 308 U/L    CK - MB 1.2  0.5 - 3.6 ng/ml    CK-MB Index 0.8  0.0 - 4.0 %    Troponin-I, Qt. <0.02  0.00 - 0.06 NG/ML   URINALYSIS W/ RFLX MICROSCOPIC    Collection Time     08/28/13  6:15 PM       Result Value Range    Color YELLOW      Appearance CLEAR      Specific gravity >1.030 (*) 1.003 - 1.030    pH (UA) 6.0  5.0 - 8.0      Protein NEGATIVE   NEG mg/dL    Glucose >1610 (*) NEG mg/dL    Ketone NEGATIVE   NEG mg/dL    Bilirubin NEGATIVE   NEG      Blood NEGATIVE   NEG      Urobilinogen 0.2  0.2 - 1.0 EU/dL    Nitrites NEGATIVE   NEG      Leukocyte Esterase NEGATIVE   NEG     DRUG SCREEN, URINE    Collection Time     08/28/13  6:15 PM       Result Value Range    BENZODIAZEPINE NEGATIVE   NEG      BARBITURATES NEGATIVE   NEG      THC (TH-CANNABINOL) NEGATIVE   NEG      OPIATES NEGATIVE   NEG      PCP(PHENCYCLIDINE) NEGATIVE   NEG      COCAINE POSITIVE (*) NEG      AMPHETAMINE NEGATIVE   NEG      METHADONE NEGATIVE   NEG      HDSCOM (NOTE)         CT HEAD WO CONT    Final Result: Impression:         1.  No evidence for intracranial hemorrhage, mass effect or midline shift.    2.  Right frontal scalp soft tissue swelling.    3.  Paranasal sinuses disease.        If there are continued symptoms, a MRI is recommended for further evaluation.       XR CHEST PORT    (Results Pending)        MDM     Differential Diagnosis; Clinical Impression; Plan:     EMS brought a bottle pf pills (25mg  amitriptyline). He had 30 pills filled May 20, and 13 pills were  picked up that were spread all over the table. Pt now says he took some little green pills used for sleeping, does not know what they were.    INITIAL CLINICAL IMPRESSION and PLANS:  The patient presents with the primary complaint(s) of: unresponsive. The presentation, to include historical aspects and clinical findings are consistent with the DX of drug OD. However, other possible DX's to consider as primary, associated with, or exacerbated by include:    1. Alcohol intoxication   2. Suicidal attempt  3. Psychiatric issues; bipolar, depression, schizophrenia  4. Drug withdrawal     Considering the above, my initial management plan to evaluate and therapeutic interventions include the following and as noted in the orders:    1. Head CT   2. CXR   3. EKG  4. Labs (lipase, UA, UDS, cardiac panel, CMP, CBC, salicylate level, blood alcohol, acetaminophen level)    Amount and/or Complexity of Data Reviewed:   Clinical lab tests:  Ordered and reviewed  Tests in the radiology section of CPT??:  Ordered and reviewed (Head CT, CXR)  Tests in the medicine section of the CPT??:  Ordered and reviewed (EKG)   Obtain history from someone other than the patient:  Yes (EMS, girlfriend)   Review and summarize past medical records:  Yes   Discuss the patient with another provider:  Yes Yehuda Mao from [employer], poison control, Barbaraann Rondo, Dr. Almon Register (Internal Medicine))   Independant visualization of image, tracing, or specimen:  Yes (EKG, CXR)  Critical Care:   Total time providing critical care:  30-74 minutes (Excluding all other billable procedures.)    MEDICATIONS GIVEN:  Medications   sodium chloride (NS) flush 5-10 mL (not administered)   naloxone (NARCAN) injection 2 mg (0 mg IntraVENous Held 08/28/13 1632)   0.9% sodium chloride 1,000 mL with mvi, adult no.4 with vit K 3,300 unit- 150 mcg/10 mL 10 mL, thiamine 100 mg, folic acid 1 mg infusion ( IntraVENous IV Completed 08/28/13 1816)        Procedures    PROGRESS NOTE:  4:29 PM    Initial assessment performed.  Recorded by Erik Obey, as dictated by Glynn Octave, MD.     PROGRESS NOTE:  4:37 PM   Pt re-examined by Nurse. Pt admits to being suicidal, and that he took handfuls of "little pills." He states he thinks it's the "stuff to help him sleep," and that he will just keep trying to kill himself. Pt states he has a chronic left side pain that no one will do anything for, and his girlfriend is not helping either.   Recorded by Erik Obey, as dictated by Glynn Octave, MD.     CONSULT NOTE:   4:50 PM  Tysen Roesler, MD spoke with Yehuda Mao from [facility]   Discussed pt's hx, disposition, and available diagnostic and imaging results. Yehuda Mao was paged at his office by [employer], and has advised that he will come to the ED to evaluate the  pt.  Written by Erik Obey, ED Scribe, as dictated by Glynn Octave, MD       PROGRESS NOTE:  5:04 PM   Girlfriend Emilia Beck) arrived and advised pt is her daughter???s father and they have been together for 16 years. She left the house this AM about 10:30 for an eye appointment, and returned about 3:30 PM and found pt unconscious. Pt had a bottle of pills spilled over table and bottle of liquor; she was unable to arouse him and she called EMS. She did advise they have been having some problems did not divulge these problems. She states when she walked in pt???s room, pt said ???this time you were able to stop it, but next time you won???t.??? She states  he has never been suicidal in the past.  Recorded by Erik Obey, as dictated by Glynn Octave, MD.     CONSULT NOTE:   6:33 PM  Cherl Gorney, MD spoke with poison control   Poison control consult: 6:10; They advised to monitor pt closely and treat sxs symptomatically. Should observe pt for at least 6 hours from time of ingestion and watch for QRS widening. If greater than 120 with HTN, should start Bicarb bolus 1 amp at a time until HTN and/or QRS widening resolves.  Written by  Erik Obey, ED Scribe, as dictated by Glynn Octave, MD.    PROGRESS NOTE:  6:35 PM   Pt re-examined by Glynn Octave, MD. Pt is more awake, still having episodes of disorientation, and at times, threatening to leave. Discussed with [facility] and thought it prudent to get emergency medical detention order for pt???s medical care and treatment.  Recorded by Erik Obey, as dictated by Glynn Octave, MD.     CONSULT NOTE:   6:36 PM  Glynn Octave, MD spoke with Barbaraann Rondo.Discussed emergency medical detention order with Barbaraann Rondo who granted it at 18:16.    Written by Erik Obey, ED Scribe, as dictated by Glynn Octave, MD.    CONSULT NOTE:   6:56 PM  Adryana Mogensen, MD spoke with Dr. Almon Register   Specialty: Internal Medicine  Discussed pt's hx, disposition, and available diagnostic and imaging results. Reviewed care plans. Dr. Almon Register agrees to admit the pt to the ICU.  Written by Erik Obey, ED Scribe, as dictated by Glynn Octave, MD     ADMISSION NOTE:  7:00 PM  Patient is being admitted to the hospital by Dr. Almon Register. The results of their tests and reasons for their admission have been discussed with them and/or available family. They convey agreement and understanding for the need to be admitted and for their admission diagnosis.    Written by Erik Obey, ED Scribe, as dictated by Glynn Octave, MD.    CONDITIONS ON ADMISSION:  Deep Vein Thrombosis is not present at the time of admission. Thrombosis is not present at the time of admission. Urinary Tract Infection is not present at the time of admission. Pneumonia is not present at the time of admission. MRSA is not present at the time of admission. Wound infection is not present at the time of admission. Pressure Ulcer is not present at the time of admission.       CLINICAL IMPRESSION    1. Drug overdose    2. Suicide attempt        7:00 PM  I have spent 45 minutes of critical care time involved in lab review,  consultations with specialist, family  decision-making, and documentation.  During this entire length of time I was immediately available to the patient.    Critical Care:  The reason for providing this level of medical care for this critically ill patient was due a critical illness that impaired one or more vital organ systems such that there was a high probability of imminent or life threatening deterioration in the patients condition. This care involved high complexity decision making to assess, manipulate, and support vital system functions, to treat this degreee vital organ system failure and to prevent further life threatening deterioration of the patient???s condition.     Recorded by Cipriano Mile, ED Scribe, as dictated by Glynn Octave, MD

## 2013-08-28 NOTE — ED Notes (Signed)
[facility] in ER at this time.

## 2013-08-28 NOTE — ED Notes (Addendum)
Pt called for CT scan. Pt refusing CT. Dr. Nicki Reaper in room to speak with pt at this time. Pt is agitated.

## 2013-08-28 NOTE — ED Notes (Signed)
Pt went to CT scan after discussion with Dr. Nicki Reaper. Will speak with [facility] at return. Pt remains with 1-1 sitter. [employer] remains in department.

## 2013-08-28 NOTE — ED Notes (Addendum)
Pt attempting to urinate at this time via urinal. Pt is less agitated than previously. Pt agrees to cooperate with staff at this time. Pt given warm blankets at request. VSS. No apparent distress noted. Pt resting L lateral side, eyes closed, responds easily to speech.

## 2013-08-28 NOTE — Progress Notes (Addendum)
2015- Received patient from ED nurse , Doristine Church. Patient AOX3 , denies any pain, denies SOB, on 2 L NC saturating at 100%. Lungs clear and diminished. Patient went to sleep right away as soon as he is transfered to ICU bed.VSS NAD. Will continue to monitor.    2055- Dr. Almon Register on bedside and speaking/consulting  with the patient. Also pt is complaining of  leg and lower back  pain. Denies killing himself.    2224Arvil Chaco , patients sister called . Informed that patient is stable and resting. Advised her that no other information can be given to her until patient wakes up and gives his permission. Lupita Leash understands.    2250- Poison control ctr c/o Marcelino Duster called and updated on patients status.Told her that VSS is stable and patient is sleeping quietly in bed. She asked for last EKG taken  ,the  QRS duration is 78.  Re -take an EKG per Marcelino Duster  to record QRS duratition again and that they like  that QRS durationl is below 100 , called Dr. Almon Register, new order for subsequent EKG is given.    2337- EKG taken, NSR, QRS interval 78. Transmitted.    0400- Patient is still sleeping, VSS NAD. Moves or turn by himself.    63Jesusita Oka of poison control   called, gave him the most recent vital signs, BP 101/88, RR 12, O2ast 100% on 2 L NC, Hr 77. QRS duration 78 ms, QT/QTc 392/441 ms. Also informed that patient during consult with Dr. Almon Register was AOX3.    0700- Patient slept the whop;e night, uneventful evening.     0726-Bedside and Verbal shift change report given to Lauren RN (oncoming nurse) by Astrid Drafts RN (offgoing nurse). Report included the following information SBAR, MAR, Recent Results, Med Rec Status and Cardiac Rhythm NSR with PVC's.
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
## 2013-08-28 NOTE — ED Notes (Signed)
Pt states "she can come back here and tell me what she wants to tell me", when asked if pt would like visitor to come back.

## 2013-08-28 NOTE — ED Notes (Signed)
Verbal shift change report given to L. Thedore Mins, RN by Corrie Dandy, RN. Report included the following information ED Summary and MAR.

## 2013-08-28 NOTE — ED Notes (Signed)
Per Dr. Nicki Reaper pt needs 1-1 sitter.
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
## 2013-08-28 NOTE — ED Notes (Signed)
TRANSFER - OUT REPORT:    Verbal report given to P. Cruz RN (name) on Tracy [name]  being transferred to ICU(unit) for routine progression of care       Report consisted of patient???s Situation, Background, Assessment and   Recommendations(SBAR).     Information from the following report(s) SBAR, ED Summary, [facility] and Recent Results was reviewed with the receiving nurse.    Opportunity for questions and clarification was provided.
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
## 2013-08-28 NOTE — ED Notes (Signed)
Dr. Nicki Reaper at bedside place [facility] under nose patient woke up and moving arms did ask where he  was

## 2013-08-28 NOTE — ED Notes (Signed)
EMS was called by girlfriend due to unresponsive. When EMS arrived patient was unresponsive. Found pills on coffee table and alcohol bottle. Patient was given narcane 2mg  with no change and ammonia no change.

## 2013-08-29 LAB — CBC WITH AUTOMATED DIFF
ABS. BASOPHILS: 0 10*3/uL (ref 0.0–0.06)
ABS. EOSINOPHILS: 0.4 10*3/uL (ref 0.0–0.4)
ABS. LYMPHOCYTES: 2.9 10*3/uL (ref 0.9–3.6)
ABS. MONOCYTES: 0.6 10*3/uL (ref 0.05–1.2)
ABS. NEUTROPHILS: 2.9 10*3/uL (ref 1.8–8.0)
BASOPHILS: 0 % (ref 0–2)
EOSINOPHILS: 6 % — ABNORMAL HIGH (ref 0–5)
HCT: 36.1 % (ref 36.0–48.0)
HGB: 12 g/dL — ABNORMAL LOW (ref 13.0–16.0)
LYMPHOCYTES: 43 % (ref 21–52)
MCH: 29.4 PG (ref 24.0–34.0)
MCHC: 33.2 g/dL (ref 31.0–37.0)
MCV: 88.5 FL (ref 74.0–97.0)
MONOCYTES: 8 % (ref 3–10)
MPV: 9.2 FL (ref 9.2–11.8)
NEUTROPHILS: 43 % (ref 40–73)
PLATELET: 241 10*3/uL (ref 135–420)
RBC: 4.08 M/uL — ABNORMAL LOW (ref 4.70–5.50)
RDW: 13.2 % (ref 11.6–14.5)
WBC: 6.8 10*3/uL (ref 4.6–13.2)

## 2013-08-29 LAB — METABOLIC PANEL, BASIC
Anion gap: 6 mmol/L (ref 3.0–18)
BUN/Creatinine ratio: 18 (ref 12–20)
BUN: 17 MG/DL (ref 7.0–18)
CO2: 28 mmol/L (ref 21–32)
Calcium: 9 MG/DL (ref 8.5–10.1)
Chloride: 107 mmol/L (ref 100–108)
Creatinine: 0.97 MG/DL (ref 0.6–1.3)
GFR est AA: >60 mL/min/{1.73_m2} (ref >60)
GFR est non-AA: >60 mL/min/{1.73_m2} (ref >60)
Glucose: 96 mg/dL (ref 74–99)
Potassium: 3.4 mmol/L — ABNORMAL LOW (ref 3.5–5.5)
Sodium: 141 mmol/L (ref 136–145)

## 2013-08-29 LAB — GLUCOSE, POC
Glucose (POC): 136 mg/dL — ABNORMAL HIGH (ref 70–110)
Glucose (POC): 91 mg/dL (ref 70–110)
Glucose (POC): 115 mg/dL — ABNORMAL HIGH (ref 70–110)
Glucose (POC): 197 mg/dL — ABNORMAL HIGH (ref 70–110)

## 2013-08-29 LAB — HEMOGLOBIN A1C WITH EAG: Hemoglobin A1c: 8.1 % — ABNORMAL HIGH (ref 4.5–6.2)

## 2013-08-29 MED ORDER — ATENOLOL 25 MG TAB
25 mg | Freq: Every day | ORAL | Status: DC
Start: 2013-08-29 — End: 2013-08-29
  Administered 2013-08-29: 14:00:00 via ORAL

## 2013-08-29 MED ORDER — PNEUMOCOCCAL 23-VALPS VACCINE 25 MCG/0.5 ML INJECTION
25 mcg/0.5 mL | INTRAMUSCULAR | Status: AC
Start: 2013-08-29 — End: 2013-08-29
  Administered 2013-08-29: 22:00:00 via INTRAMUSCULAR

## 2013-08-29 MED ORDER — PROMETHAZINE 25 MG/ML INJECTION
25 mg/mL | Freq: Three times a day (TID) | INTRAMUSCULAR | Status: DC | PRN
Start: 2013-08-29 — End: 2013-08-29

## 2013-08-29 MED ORDER — ACETAMINOPHEN 325 MG TABLET
325 mg | ORAL | Status: DC | PRN
Start: 2013-08-29 — End: 2013-08-29
  Administered 2013-08-29: 14:00:00 via ORAL

## 2013-08-29 MED ORDER — POTASSIUM CHLORIDE SR 20 MEQ TAB, PARTICLES/CRYSTALS
20 mEq | ORAL | Status: AC
Start: 2013-08-29 — End: 2013-08-29
  Administered 2013-08-29: 14:00:00 via ORAL

## 2013-08-29 MED ORDER — CARISOPRODOL 350 MG TAB
350 mg | Freq: Four times a day (QID) | ORAL | Status: DC
Start: 2013-08-29 — End: 2013-08-29
  Administered 2013-08-29: 05:00:00 via ORAL

## 2013-08-29 MED ORDER — ACETAMINOPHEN 325 MG TABLET
325 mg | ORAL | Status: DC | PRN
Start: 2013-08-29 — End: 2013-08-29

## 2013-08-29 MED ORDER — INSULIN LISPRO 100 UNIT/ML INJECTION
100 unit/mL | Freq: Three times a day (TID) | SUBCUTANEOUS | Status: DC
Start: 2013-08-29 — End: 2013-08-29
  Administered 2013-08-29 (×2): via SUBCUTANEOUS

## 2013-08-29 MED ORDER — GLUCAGON 1 MG INJECTION
1 mg | INTRAMUSCULAR | Status: DC | PRN
Start: 2013-08-29 — End: 2013-08-29

## 2013-08-29 MED ORDER — ALFUZOSIN SR 10 MG 24 HR TAB
10 mg | Freq: Every day | ORAL | Status: DC
Start: 2013-08-29 — End: 2013-08-29

## 2013-08-29 MED ORDER — CLOPIDOGREL 75 MG TAB
75 mg | Freq: Every day | ORAL | Status: DC
Start: 2013-08-29 — End: 2013-08-29
  Administered 2013-08-29: 14:00:00 via ORAL

## 2013-08-29 MED ORDER — TAMSULOSIN SR 0.4 MG 24 HR CAP
0.4 mg | Freq: Every day | ORAL | Status: DC
Start: 2013-08-29 — End: 2013-08-29

## 2013-08-29 MED ORDER — OXYCODONE-ACETAMINOPHEN 5 MG-325 MG TAB
5-325 mg | Freq: Four times a day (QID) | ORAL | Status: DC | PRN
Start: 2013-08-29 — End: 2013-08-29
  Administered 2013-08-29 (×2): via ORAL

## 2013-08-29 MED ORDER — SIMVASTATIN 20 MG TAB
20 mg | Freq: Every evening | ORAL | Status: DC
Start: 2013-08-29 — End: 2013-08-29
  Administered 2013-08-29: 04:00:00 via ORAL

## 2013-08-29 MED ORDER — PROMETHAZINE 25 MG TAB
25 mg | Freq: Four times a day (QID) | ORAL | Status: DC | PRN
Start: 2013-08-29 — End: 2013-08-29

## 2013-08-29 MED ORDER — FLU VACCINE TS 2014-15 (5 YR+)(PF) 45 MCG (15 MCG X3)/0.5 ML IM SYRINGE
45 mcg (15 mcg x 3)/0.5 mL | INTRAMUSCULAR | Status: AC
Start: 2013-08-29 — End: 2013-08-29
  Administered 2013-08-29: 22:00:00 via INTRAMUSCULAR

## 2013-08-29 MED ORDER — CYCLOBENZAPRINE 10 MG TAB
10 mg | Freq: Three times a day (TID) | ORAL | Status: DC | PRN
Start: 2013-08-29 — End: 2013-08-29

## 2013-08-29 MED ORDER — ZOLPIDEM 5 MG TAB
5 mg | Freq: Every evening | ORAL | Status: DC | PRN
Start: 2013-08-29 — End: 2013-08-29

## 2013-08-29 MED ORDER — ASPIRIN 325 MG TAB
325 mg | Freq: Every day | ORAL | Status: DC
Start: 2013-08-29 — End: 2013-08-29
  Administered 2013-08-29: 14:00:00 via ORAL

## 2013-08-29 MED ORDER — DICLOFENAC 75 MG TAB, DELAYED RELEASE
75 mg | Freq: Two times a day (BID) | ORAL | Status: DC
Start: 2013-08-29 — End: 2013-08-29

## 2013-08-29 MED ORDER — INSULIN LISPRO 100 UNIT/ML INJECTION
100 unit/mL | Freq: Four times a day (QID) | SUBCUTANEOUS | Status: DC
Start: 2013-08-29 — End: 2013-08-29
  Administered 2013-08-29: 21:00:00 via SUBCUTANEOUS

## 2013-08-29 MED ORDER — ENOXAPARIN 40 MG/0.4 ML SUB-Q SYRINGE
40 mg/0.4 mL | SUBCUTANEOUS | Status: DC
Start: 2013-08-29 — End: 2013-08-29
  Administered 2013-08-29: 04:00:00 via SUBCUTANEOUS

## 2013-08-29 MED ORDER — DEXTROSE 50% IN WATER (D50W) IV SYRG
INTRAVENOUS | Status: DC | PRN
Start: 2013-08-29 — End: 2013-08-29

## 2013-08-29 MED ORDER — GLUCOSE 4 GRAM CHEWABLE TAB
4 gram | ORAL | Status: DC | PRN
Start: 2013-08-29 — End: 2013-08-29

## 2013-08-29 MED ADMIN — insulin glargine (LANTUS) injection 16 Units: SUBCUTANEOUS | @ 05:00:00 | NDC 00088222033

## 2013-08-29 NOTE — Progress Notes (Addendum)
Hospitalist Progress Note:    ASSESSMENT:   Active Problems:    Drug overdose (08/28/2013)         PLAN:   1.  Overdose - Continue supportive care.  As he would not discuss the matter     with me, I cannot comment on whether or not it was an error with meds and etoh or an intentional overdose.  Has denied suicidal ideation with   2.  Potassium down a little.  Replete with 40 meq po x 1.  3.  Chronic pain / Back pain followed by PMD/Neurology at [facility].  4.  DM2.  Sub-optimal control.  A1c 8.1.  5.  HTN.  6.  CAD - No symptoms referable to the CV system.    7. Dispo - If decision made by psych that he requires inpatient stabilization, should be able to go anytime.  Medical issues appear stable at this time.          CC:   SUBJECTIVE:  Seen and examined.  Admitted for overdose.  Per records denies SI.  Was found unresponsive.  Concern for alcohol, elavil and fentanyl patch related overdose.  Poison control has been following.  UDS with Cocaine positivity.      This morning was reluctant to talk.  Did relate that he follows with physicians [facility].  Tells he is seeing Dr. Sharon Seller of neurology and that a MRI of his lower back was completed.  He tells he has not heard how the test turned out yet.  C/o lower back pain  That radiates down the right leg to the knee.      Attempted to discuss overdose and events of last night.  Refused to discuss those things.  I let him know that a psychiatrist would see him later for evaluation.  He did not vocalize any objections.             OBJECTIVE:   Vitals:   Visit Vitals   Item Reading   ??? BP 123/78   ??? Pulse 75   ??? Temp 98.6 ??F (37 ??C)   ??? Resp 14   ??? Ht 5\' 11"  (1.803 m)   ??? Wt 78.382 kg (172 lb 12.8 oz)   ??? BMI 24.11 kg/m2   ??? SpO2 100%       Gen:  Nad, slightly somnolent.  Heent: MMM  Cor:  S1S2 RRR  Resp:  CTA B/L  Ext:  No C/C/E.         Labs and Radiology:   Recent Labs      08/29/13   0457  08/28/13   1630   WBC  6.8  6.6   HGB  12.0*  12.5*   HCT  36.1   37.5   PLT  241  255     Recent Labs      08/29/13   0457  08/28/13   1630   NA  141  143   K  3.4*  3.8   CL  107  106   CO2  28  31   GLU  96  149*   BUN  17  19*   CREA  0.97  0.96   CA  9.0  9.0              Delena Bali, M.D.

## 2013-08-29 NOTE — Wound Image (Signed)
Wound care in for assessment. Psychologist in room at this time. No skin issues to note. Will continue to follow as needed. Lauren, RN notified

## 2013-08-29 NOTE — Progress Notes (Addendum)
0830: Received report and assumed care of pt. Pt is resting quietly, alert and oriented with pain in his back. PRN tylenol given. Pt refused flexeril and other morning medicines stating that he has not taken them in months. Updates made to pt's prior to admission med list. Pt states that percocet worked well for him at home and nurse will page physician to ask about this medication. Pt on RA with oxygen saturations at 100%. Sitter remains at bedside.      1000: Pt complaining of pain in his back radiating down his left leg. Paged Dr. Cathlean Cower and orders given for percocet.     1100: Dr. Delane Ginger in to see pt. Bladder scan performed which showed 500 mL of urine. Pt stated he would sit at edge of bed and try to use urinal after lunch.     1400: Pt resting quietly with no complaints after percocet dose.     1700: Pt encouraged to schedule an appointment with both neurology and pain management doctors to discuss increased back pain radiating down his left leg. Pt also instructed on the importance of avoiding alcohol and illicit drug use especially while on pain medications. Pt given vaccines and pain medication prior to discharge and set up with taxi transport. No new medicines prescribed for pt on discharge. Armband removed and shredded and pt given print out discharge instructions and summary report for his physicians.

## 2013-08-29 NOTE — Progress Notes (Signed)
Spoke with Dr. Delane Ginger.  No SI.  Not suicide attempt.  Thinking is that he took too much pain medication and etoh may have been involved.  Does not require psychiatric care.  Cocaine positivity noted.  It was suggested that he abstain from illicit medications.  With regard to his chronic pain, he will need to f/u with his pain mgmt physician and neurologist.  He has an outpatient treatment and w/u ongoing for this.

## 2013-08-29 NOTE — Progress Notes (Signed)
When chaplain attempted to visit patient it was not a good time to visit. This was second attempted visit. On both visits, staff was with patient assisting him. No visitors present.   [address] [place]  [phone]

## 2013-08-30 LAB — EKG, 12 LEAD, INITIAL
Atrial Rate: 92 {beats}/min
Calculated P Axis: 79 degrees
Calculated R Axis: 29 degrees
Calculated T Axis: 73 degrees
Diagnosis: NORMAL
P-R Interval: 188 ms
Q-T Interval: 366 ms
QRS Duration: 78 ms
QTC Calculation (Bezet): 452 ms
Ventricular Rate: 92 {beats}/min

## 2013-08-30 LAB — EKG, 12 LEAD, SUBSEQUENT
Atrial Rate: 76 {beats}/min
Calculated P Axis: 63 degrees
Calculated R Axis: 28 degrees
Calculated T Axis: 54 degrees
Diagnosis: NORMAL
P-R Interval: 196 ms
Q-T Interval: 392 ms
QRS Duration: 78 ms
QTC Calculation (Bezet): 441 ms
Ventricular Rate: 76 {beats}/min
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
## 2013-09-15 NOTE — Discharge Summary (Signed)
[facility]                              [address]                          [facility] [place] [address]                               DISCHARGE SUMMARY    PATIENT:     Tracy [name], Tracy [name]  MRN:             [id number]  ADMIT DATE:       08/28/2013  BILLING:         [id number] DISCHARGE DATE:   08/29/2013  REFERRING:   Chester Holstein, MD  DICTATING:   Chester Holstein, MD        ADMISSION DIAGNOSIS: Drug overdose.    HISTORY OF PRESENT ILLNESS: The patient is a 57 year old African American  male who was brought to the emergency room for evaluation because he was  found unresponsive. There was a bottle of pills found with him as well as  alcohol and he was admitted for ongoing management.    HOSPITAL COURSE: Poison Control was contacted on admission. The patient was  put on suicide precautions and his cardiac rhythm was followed in the ICU.  His status improved and he was able to transfer out of the ICU. Psychiatry  consultation was obtained and it was felt that the patient was not  suicidal. He did not pose a risk to himself and he was appropriate for  discharge. The patient became medically cleared for discharge as his  hemodynamics were stable and his cardiac rhythm was appropriate. There was  much conversation had about avoiding alcohol and prescription drugs  together. He said he was not trying to harm himself and he was allowed to  leave the hospital on 08/29/2013. Please also see his electronic medical  record for further details.                     Chester Holstein, MD      PH:wmx  D: 09/15/2013 12:57 P   T: 09/15/2013 02:47 P  BY:  [id number]  #:  [id number]  CScriptDoc #:  [id number]    cc:   Chester Holstein, MD
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
## 2014-06-07 NOTE — ED Notes (Signed)
Pt states mid-sternal chest pain "is better and it's a two out of ten now."  Pt is resting in NAD, respirations equal and non-labored.
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
## 2014-06-07 NOTE — Other (Signed)
TRANSFER - OUT REPORT:    Bedside report given to Mariane BaumgartenJennifer Green, RN on [name]  being transferred to telemetry for routine progression of care       Report consisted of patient???s Situation, Background, Assessment and   Recommendations(SBAR).     Information from the following report(s) SBAR, Kardex, ED Summary, Procedure Summary, Intake/Output, MAR, Recent Results and Cardiac Rhythm NSR was reviewed with the receiving nurse.    Lines:   Peripheral IV 06/07/14 Left Antecubital (Active)   Site Assessment Clean, dry, & intact 06/07/2014  9:30 PM   Phlebitis Assessment 0 06/07/2014  9:30 PM   Infiltration Assessment 0 06/07/2014  9:30 PM   Dressing Status Clean, dry, & intact 06/07/2014  9:30 PM        Opportunity for questions and clarification was provided.      Patient transported with:   Monitor  O2 @ 2 liters  Tech

## 2014-06-07 NOTE — ED Provider Notes (Signed)
HPI Comments: 9:03 PM  Tracy [name] is a 58 y.o. male with hx of MI x 2 presenting to the ED via EMS C/O constant, sharp midline chest pain which radiates down left arm onset about 1.5 hours ago when pt was in an argument with his significant other. Pt reports his pain feels the same as his past MI. Pt is on Aspirin and reports he has been taking it everyday. Per EMS, they gave him Aspirin and Nitro x 2 en route to ED which decreased pt's pain from a 9/10 to a 5/10. PMHx includes DM, HTN. Pt is on Plavix and Aspirin and reports he takes them as directed. Surgical hx includes cardiac stent placement x 3 4 years ago. Pt reports seeing his cardiologist Dr. Oletta Lamas 3 days ago; denies recent stress test since his first MI. Pt denies recent surgeries, smoking, drinking, and any other symptoms or complaints at this time.     Patient is a 58 y.o. male presenting with chest pain. The history is provided by the patient and the EMS personnel. No language interpreter was used.   Chest Pain (Angina)   This is a new problem. The current episode started 1 to 2 hours ago. The problem occurs constantly. Pain location: Midline. The pain is at a severity of 5/10. The quality of the pain is described as sharp. The pain radiates to the left arm. Pertinent negatives include no abdominal pain, no cough, no fever, no headaches, no nausea, no palpitations, no shortness of breath and no vomiting. He has tried aspirin and nitroglycerin for the symptoms. The treatment provided significant relief. Risk factors include diabetes mellitus, male gender, hypertension and cardiac disease. His past medical history is significant for DM and HTN.Procedural history includes stress echo and cardiac stents.     Written by Kathie Rhodes, ED Scribe, as dictated by Smitty Knudsen, MD.     Past Medical History   Diagnosis Date   ??? CAD (coronary artery disease)    ??? Diabetes ([place])    ??? MI (myocardial infarction) ([place])    ??? Hypertension     ??? High cholesterol    ??? Ill-defined condition      prostate problems        Past Surgical History   Procedure Laterality Date   ??? Pr cardiac surg procedure unlist     ??? Hx coronary stent placement           History reviewed. No pertinent family history.     History     Social History   ??? Marital Status: SINGLE     Spouse Name: N/A     Number of Children: N/A   ??? Years of Education: N/A     Occupational History   ??? Not on file.     Social History Main Topics   ??? Smoking status: Former Smoker   ??? Smokeless tobacco: Not on file   ??? Alcohol Use: Yes   ??? Drug Use: Not on file   ??? Sexual Activity: Not on file     Other Topics Concern   ??? Not on file     Social History Narrative                  ALLERGIES: Review of patient's allergies indicates no known allergies.      Review of Systems   Constitutional: Negative for fever and fatigue.   HENT: Negative for rhinorrhea and sore throat.    Respiratory: Negative for cough  and shortness of breath.    Cardiovascular: Positive for chest pain. Negative for palpitations.   Gastrointestinal: Negative for nausea, vomiting, abdominal pain and diarrhea.   Genitourinary: Negative for dysuria and difficulty urinating.   Musculoskeletal: Negative for myalgias and arthralgias.   Skin: Negative for color change and rash.   Neurological: Negative for light-headedness and headaches.   All other systems reviewed and are negative.      Filed Vitals:    06/07/14 2111 06/07/14 2118 06/07/14 2130 06/07/14 2200   BP: 139/81 136/82 140/79 127/66   Pulse: 108 99 89 82   Temp: 98.7 ??F (37.1 ??C)      Resp: _0 Height: 5' 11" (1.803 m)      Weight: 83.915 kg (185 lb)      SpO2: 100%  100% 100%            Physical Exam   Constitutional: He is oriented to person, place, and time. He appears well-developed and well-nourished. No distress (No acute distress).   Slightly clammy   HENT:   Head: Normocephalic and atraumatic.   Eyes: Pupils are equal, round, and reactive to light.    Neck: Neck supple.   Cardiovascular: Normal rate, regular rhythm, S1 normal, S2 normal and normal heart sounds.    Pulmonary/Chest: Breath sounds normal. No respiratory distress. He has no wheezes. He has no rales. He exhibits no tenderness.   Abdominal: Soft. He exhibits no distension and no mass. There is no tenderness. There is no guarding.   Musculoskeletal: Normal range of motion. He exhibits no edema or tenderness.   Neurological: He is alert and oriented to person, place, and time. No cranial nerve deficit.   Skin: No rash noted.   Psychiatric: He has a normal mood and affect. His behavior is normal. Thought content normal.   Nursing note and vitals reviewed.    RESULTS:    EKG interpretation: (Preliminary)  Sinus tachycardia; rate is 110 bpm. Nonspecific ST - T changes. No change since 11/14.  EKG read by Smitty Knudsen, MD at 21:06     RADIOLOGY RESULT  10:32 PM   Chest X-ray shows NAD.   Pending review by radiologist.   Written by Kathie Rhodes, ED Scribe, as dictated by Smitty Knudsen, MD.      XR CHEST PORT   Preliminary Result           Labs Reviewed   CBC WITH AUTOMATED DIFF - Abnormal; Notable for the following:     RBC 4.67 (*)     All other components within normal limits   METABOLIC PANEL, COMPREHENSIVE - Abnormal; Notable for the following:     Glucose 252 (*)     Alk. phosphatase 39 (*)     All other components within normal limits   CARDIAC PANEL,(CK, CKMB & TROPONIN)   PROTHROMBIN TIME + INR   PTT   URINALYSIS W/ RFLX MICROSCOPIC   DRUG SCREEN, URINE       Recent Results (from the past 12 hour(s))   EKG, 12 LEAD, INITIAL    Collection Time: 06/07/14  9:06 PM   Result Value Ref Range    Ventricular Rate 110 BPM    Atrial Rate 110 BPM    P-R Interval 184 ms    QRS Duration 74 ms    Q-T Interval 340 ms    QTC Calculation (Bezet) 460 ms    Calculated P Axis 69 degrees    Calculated  R Axis -12 degrees    Calculated T Axis 77 degrees    Diagnosis       Sinus tachycardia   Cannot rule out Anterior infarct , age undetermined  Abnormal ECG  When compared with ECG of 28-Aug-2013 23:37,  No significant change was found     CARDIAC PANEL,(CK, CKMB & TROPONIN)    Collection Time: 06/07/14  9:15 PM   Result Value Ref Range    CK 87 39 - 308 U/L    CK - MB 1.2 0.5 - 3.6 ng/ml    CK-MB Index 1.4 0.0 - 4.0 %    Troponin-I, Qt. <0.02 0.00 - 0.06 NG/ML   CBC WITH AUTOMATED DIFF    Collection Time: 06/07/14  9:15 PM   Result Value Ref Range    WBC 6.8 4.6 - 13.2 K/uL    RBC 4.67 (L) 4.70 - 5.50 M/uL    HGB 13.8 13.0 - 16.0 g/dL    HCT 40.5 36.0 - 48.0 %    MCV 86.7 74.0 - 97.0 FL    MCH 29.6 24.0 - 34.0 PG    MCHC 34.1 31.0 - 37.0 g/dL    RDW 12.6 11.6 - 14.5 %    PLATELET 223 135 - 420 K/uL    MPV 10.0 9.2 - 11.8 FL    NEUTROPHILS 59 40 - 73 %    LYMPHOCYTES 28 21 - 52 %    MONOCYTES 10 3 - 10 %    EOSINOPHILS 3 0 - 5 %    BASOPHILS 0 0 - 2 %    ABS. NEUTROPHILS 4.1 1.8 - 8.0 K/UL    ABS. LYMPHOCYTES 1.9 0.9 - 3.6 K/UL    ABS. MONOCYTES 0.7 0.05 - 1.2 K/UL    ABS. EOSINOPHILS 0.2 0.0 - 0.4 K/UL    ABS. BASOPHILS 0.0 0.0 - 0.06 K/UL    DF AUTOMATED     METABOLIC PANEL, COMPREHENSIVE    Collection Time: 06/07/14  9:15 PM   Result Value Ref Range    Sodium 144 136 - 145 mmol/L    Potassium 3.6 3.5 - 5.5 mmol/L    Chloride 107 100 - 108 mmol/L    CO2 26 21 - 32 mmol/L    Anion gap 11 3.0 - 18 mmol/L    Glucose 252 (H) 74 - 99 mg/dL    BUN 16 7.0 - 18 MG/DL    Creatinine 1.10 0.6 - 1.3 MG/DL    BUN/Creatinine ratio 15 12 - 20      GFR est AA >60 >60 ml/min/1.66m    GFR est non-AA >60 >60 ml/min/1.761m   Calcium 9.1 8.5 - 10.1 MG/DL    Bilirubin, total 0.2 0.2 - 1.0 MG/DL    ALT 35 16 - 61 U/L    AST 20 15 - 37 U/L    Alk. phosphatase 39 (L) 45 - 117 U/L    Protein, total 7.1 6.4 - 8.2 g/dL    Albumin 3.5 3.4 - 5.0 g/dL    Globulin 3.6 2.0 - 4.0 g/dL    A-G Ratio 1.0 0.8 - 1.7     PROTHROMBIN TIME + INR    Collection Time: 06/07/14  9:15 PM   Result Value Ref Range     Prothrombin time 12.8 11.5 - 15.2 sec    INR 1.0 0.8 - 1.2     PTT    Collection Time: 06/07/14  9:15 PM   Result Value Ref Range  aPTT 25.3 24.6 - 37.7 SEC         MDM  Number of Diagnoses or Management Options  Acute chest pain:   Chronic pain:   Diagnosis management comments: INITIAL CLINICAL IMPRESSION and PLANS:  The patient presents with the primary complaint(s) of: chest pain. The presentation, to include historical aspects and clinical findings are consistent with the DX of chest pain. However, other possible DX's to consider as primary, associated with, or exacerbated by include:    MI, ACS, Pericarditis, Dissection, PE, Panic attack, GI    Considering the above, my initial management plan to evaluate and therapeutic interventions include the following and as noted in the orders:    1.  Labs: Urine DS, Cardiac panel, CBC, CMP, PT + INR, PTT, UA  2.  Imaging: EKG, Chest XR  3.  Medications: Nitrobid           Amount and/or Complexity of Data Reviewed  Clinical lab tests: ordered and reviewed  Tests in the radiology section of CPT??: ordered and reviewed (Chest XR)  Tests in the medicine section of CPT??: ordered and reviewed (EKG)  Obtain history from someone other than the patient: yes (EMS)  Review and summarize past medical records: yes  Discuss the patient with other providers: yes Lorenda Peck, MD (Internal Medicine))  Independent visualization of images, tracings, or specimens: yes (EKG, Chest XR)      MEDICATIONS GIVEN:  Medications   sodium chloride (NS) flush 5-10 mL (not administered)   sodium chloride (NS) flush 5-10 mL (not administered)   nitroglycerin (NITROBID) 2 % ointment 1 Inch (1 Inch Topical Given 06/07/14 2118)   acetaminophen (TYLENOL) tablet 650 mg (650 mg Oral Given 06/07/14 2154)        Procedures    PROGRESS NOTE:  9:03 PM  Initial assessment performed.  Recorded by Kathie Rhodes, ED Scribe, as dictated by Smitty Knudsen, MD.    CONSULT NOTE:   10:42 PM   Reeshemah Nazaryan, MD spoke with Lorenda Peck, MD   Specialty: Internal Medicine  Discussed pt's hx, disposition, and available diagnostic and imaging results. Reviewed care plans. Consulting physician agrees with plans as outlined.  Will admit pt to Telemetry.   Written by Jeannette How, ED Scribe, as dictated by Smitty Knudsen, MD    ADMISSION NOTE:  10:44 PM  Patient is being admitted to the hospital by Lorenda Peck, MD. The results of their tests and reasons for their admission have been discussed with them and/or available family. They convey agreement and understanding for the need to be admitted and for their admission diagnosis.    Written by Jeannette How, ED Scribe, as dictated by Smitty Knudsen, MD.    CONDITIONS ON ADMISSION:  Deep Vein Thrombosis is not present at the time of admission. Thrombosis is not present at the time of admission. Urinary Tract Infection is not present at the time of admission. Pneumonia is not present at the time of admission. MRSA is not present at the time of admission. Wound infection is not present at the time of admission. Pressure Ulcer is not present at the time of admission.     CLINICAL IMPRESSION  1. Acute chest pain    2. Chronic pain        Recorded by Kathie Rhodes, ED Scribe, as dictated by Smitty Knudsen, MD.    I agree with the above documentation as written by the scribe.  -Namir Neto, MD.

## 2014-06-07 NOTE — H&P (Signed)
Medicine History and Physical    Patient: Tracy [name]   Age:  58 y.o.  CC:  Chest pain  HPI:   Tracy [name] is a 58 y.o. male with hx of MI x 2 presenting to the hospital via EMS C/O constant, sharp midline chest pain which radiates down left arm onset about earlier today when pt was in an argument with his significant other. Pt reports his pain feels the same as his past MI. Pt is on Aspirin and plavix and reports he has been taking it everyday. Per EMS, they gave him Aspirin and Nitro x 2 en route to ED which decreased pt's pain from a 9/10 to a 5/10.   Surgical hx includes cardiac stent placement; (2 stents placed 5 years ago and then 6 months later had another stent placed). Pt reports seeing his cardiologist Dr. Oletta Lamas 3 days ago; denies recent stress test since his first MI. Also c/o left knee pain on and off.  Patient takes fentanyl patch and percocet daily for his low back pain and follows up with pain management.  Pt denies smoking, drinking, or any other symptoms or complaints at this time. Pertinent negatives include no abdominal pain, no cough, no fever, no headaches, no nausea, no palpitations, no shortness of breath and no vomiting.      Past Medical History    Diagnosis  Date    ???  CAD (coronary artery disease)     ???  Diabetes ([place])     ???  MI (myocardial infarction) ([place])     ???  Hypertension     ???  High cholesterol     ???  Ill-defined condition       prostate problems       Past Surgical History    Procedure  Laterality  Date    ???  Pr cardiac surg procedure unlist      ???  Hx coronary stent placement           History reviewed. No pertinent family history.   History    Social History    ???  Marital Status:  SINGLE      Spouse Name:  N/A      Number of Children:  N/A    ???  Years of Education:  N/A    Occupational History    ???  Not on file.    Social History Main Topics    ???  Smoking status:  Former Smoker    ???  Smokeless tobacco:  Not on file    ???  Alcohol Use:  Yes    ???  Drug Use:  Not on file     ???  Sexual Activity:  Not on file    Other Topics  Concern    ???  Not on file    Social History Narrative        ALLERGIES: Review of patient's allergies indicates no known allergies.      Review of Systems   Constitutional: Negative for fever and fatigue.   HENT: Negative for rhinorrhea and sore throat.   Respiratory: Negative for cough and shortness of breath.   Cardiovascular: Positive for chest pain. Negative for palpitations.   Gastrointestinal: Negative for nausea, vomiting, abdominal pain and diarrhea.   Genitourinary: Negative for dysuria and difficulty urinating.   Musculoskeletal: Positive for arthralgia.  Negative for myalgias.   Skin: Negative for color change and rash.   Neurological: Negative for light-headedness and headaches.   All  other systems reviewed and are negative.    Visit Vitals   Item Reading   ??? BP 127/66 mmHg   ??? Pulse 82   ??? Temp 98.7 ??F (37.1 ??C)   ??? Resp 20   ??? Ht 5' 11"  (1.803 m)   ??? Wt 83.915 kg (185 lb)   ??? BMI 25.81 kg/m2   ??? SpO2 100%       Physical Exam   Constitutional: He is oriented to person, place, and time. He appears well-developed and well-nourished. No distress. Slightly clammy  HENT: Head: Normocephalic and atraumatic.   Eyes: Pupils are equal, round, and reactive to light.   Neck: Neck supple.   Cardiovascular: Normal rate, regular rhythm, S1 normal, S2 normal and normal heart sounds.   Pulmonary/Chest: Breath sounds normal. No respiratory distress. He has no wheezes. He has no rales. He exhibits no tenderness.   Abdominal: Soft. He exhibits no distension and no mass. There is no tenderness. There is no guarding.   Musculoskeletal: Normal range of motion. He exhibits no edema or tenderness.   Neurological: He is alert and oriented to person, place, and time. No cranial nerve deficit.   Skin: No rash noted.   Psychiatric: He has a normal mood and affect. His behavior is normal. Thought content normal.   Nursing note and vitals reviewed.    RESULTS:     EKG; Sinus tachycardia; rate is 110 bpm. Nonspecific ST - T changes. No change since 11/14.    RADIOLOGY RESULT  10:32 PM   Chest X-ray shows NAD.      Recent Results    Recent Results (from the past 12 hour(s))    EKG, 12 LEAD, INITIAL     Collection Time: 06/07/14 9:06 PM    Result  Value  Ref Range     Ventricular Rate  110  BPM     Atrial Rate  110  BPM     P-R Interval  184  ms   QRS Duration  74  ms     Q-T Interval  340  ms     QTC Calculation (Bezet)  460  ms     Calculated P Axis  69  degrees     Calculated R Axis  -12  degrees     Calculated T Axis  77  degrees     Diagnosis        Sinus tachycardia  Cannot rule out Anterior infarct , age undetermined  Abnormal ECG  When compared with ECG of 28-Aug-2013 23:37,  No significant change was found   CARDIAC PANEL,(CK, CKMB & TROPONIN)     Collection Time: 06/07/14 9:15 PM    Result  Value  Ref Range     CK  87  39 - 308 U/L     CK - MB  1.2  0.5 - 3.6 ng/ml     CK-MB Index  1.4  0.0 - 4.0 %     Troponin-I, Qt.  <0.02  0.00 - 0.06 NG/ML    CBC WITH AUTOMATED DIFF     Collection Time: 06/07/14 9:15 PM    Result  Value  Ref Range     WBC  6.8  4.6 - 13.2 K/uL     RBC  4.67 (L)  4.70 - 5.50 M/uL     HGB  13.8  13.0 - 16.0 g/dL     HCT  40.5  36.0 - 48.0 %     MCV  86.7  74.0 - 97.0 FL     MCH  29.6  24.0 - 34.0 PG     MCHC  34.1  31.0 - 37.0 g/dL     RDW  12.6  11.6 - 14.5 %     PLATELET  223  135 - 420 K/uL     MPV  10.0  9.2 - 11.8 FL     NEUTROPHILS  59  40 - 73 %     LYMPHOCYTES  28  21 - 52 %     MONOCYTES  10  3 - 10 %     EOSINOPHILS  3  0 - 5 %     BASOPHILS  0  0 - 2 %     ABS. NEUTROPHILS  4.1  1.8 - 8.0 K/UL     ABS. LYMPHOCYTES  1.9  0.9 - 3.6 K/UL     ABS. MONOCYTES  0.7  0.05 - 1.2 K/UL     ABS. EOSINOPHILS  0.2  0.0 - 0.4 K/UL     ABS. BASOPHILS  0.0  0.0 - 0.06 K/UL     DF  AUTOMATED     METABOLIC PANEL, COMPREHENSIVE     Collection Time: 06/07/14 9:15 PM    Result  Value  Ref Range     Sodium  144  136 - 145 mmol/L      Potassium  3.6  3.5 - 5.5 mmol/L     Chloride  107  100 - 108 mmol/L     CO2  26  21 - 32 mmol/L     Anion gap  11  3.0 - 18 mmol/L     Glucose  252 (H)  74 - 99 mg/dL     BUN  16  7.0 - 18 MG/DL     Creatinine  1.10  0.6 - 1.3 MG/DL     BUN/Creatinine ratio  15  12 - 20      GFR est AA  >60  >60 ml/min/1.56m     GFR est non-AA  >60  >60 ml/min/1.734m    Calcium  9.1  8.5 - 10.1 MG/DL     Bilirubin, total  0.2  0.2 - 1.0 MG/DL     ALT  35  16 - 61 U/L     AST  20  15 - 37 U/L     Alk. phosphatase  39 (L)  45 - 117 U/L     Protein, total  7.1  6.4 - 8.2 g/dL     Albumin  3.5  3.4 - 5.0 g/dL     Globulin  3.6  2.0 - 4.0 g/dL     A-G Ratio  1.0  0.8 - 1.7     PROTHROMBIN TIME + INR     Collection Time: 06/07/14 9:15 PM    Result  Value  Ref Range     Prothrombin time  12.8  11.5 - 15.2 sec   INR  1.0  0.8 - 1.2     PTT     Collection Time: 06/07/14 9:15 PM    Result  Value  Ref Range     aPTT  25.3  24.6 - 37.7 SEC            Home Medications:  Prior to Admission medications    Medication Sig Start Date End Date Taking? Authorizing Provider   fentaNYL (DURAGESIC) 25 mcg/hr PATCH 1 Patch by TransDERmal route every seventy-two (72) hours.  Yes Phys Other, MD   aspirin 81 mg chewable tablet Take 81 mg by mouth daily.   Yes Phys Other, MD   oxyCODONE-acetaminophen (PERCOCET) 7.5-325 mg per tablet Take  by mouth two (2) times a day.   Yes Phys Other, MD   insulin NPH (NOVOLIN N) 100 unit/mL injection by SubCUTAneous route Before breakfast, lunch, and dinner. Indications: TYPE 2 DIABETES MELLITUS   Yes Phys Other, MD   insulin glargine (LANTUS SOLOSTAR) 100 unit/mL (3 mL) pen by SubCUTAneous route.   Yes Phys Other, MD   clopidogrel (PLAVIX) 75 mg tablet Take 75 mg by mouth daily.   Yes Phys Other, MD   ATENOLOL PO Take 25 mg by mouth two (2) times a day.   Yes Phys Other, MD   SIMVASTATIN PO Take 20 mg by mouth daily.   Yes Phys Other, MD       Intake and Output:  Current Shift:     Last three shifts:         Medications Reviewed.    Assessment/Plan   Active Problems:    Chest pain (06/07/2014)    Acute coronary syndrome    Diabetes with CAD      Plan;  -Admit to telemetry  -IV hydration  -Pain control  -f/u cardiac enzymes and labs in am  -Cardiology consultation  -Basal and correctional insulin  -Case management for domestic issues at home with his significant other  -DVT ppx -heparin  -Full code

## 2014-06-07 NOTE — ED Notes (Signed)
Pt c/o "pounding headache" r/t nitroglycerin.  Dr. Nicki Reaper made aware, order given.

## 2014-06-07 NOTE — ED Notes (Signed)
Pt states mid-sternal chest pain remains 4/10 and pain has not increased.  Pt aware that urine is needed for urinalysis.

## 2014-06-08 LAB — METABOLIC PANEL, COMPREHENSIVE
A-G Ratio: 1 (ref 0.8–1.7)
A-G Ratio: 1 (ref 0.8–1.7)
ALT (SGPT): 35 U/L (ref 16–61)
ALT (SGPT): 33 U/L (ref 16–61)
AST (SGOT): 20 U/L (ref 15–37)
AST (SGOT): 20 U/L (ref 15–37)
Albumin: 3.5 g/dL (ref 3.4–5.0)
Albumin: 3.3 g/dL — ABNORMAL LOW (ref 3.4–5.0)
Alk. phosphatase: 39 U/L — ABNORMAL LOW (ref 45–117)
Alk. phosphatase: 30 U/L — ABNORMAL LOW (ref 45–117)
Anion gap: 11 mmol/L (ref 3.0–18)
Anion gap: 9 mmol/L (ref 3.0–18)
BUN/Creatinine ratio: 15 (ref 12–20)
BUN/Creatinine ratio: 16 (ref 12–20)
BUN: 16 MG/DL (ref 7.0–18)
BUN: 16 MG/DL (ref 7.0–18)
Bilirubin, total: 0.2 MG/DL (ref 0.2–1.0)
Bilirubin, total: 0.3 MG/DL (ref 0.2–1.0)
CO2: 26 mmol/L (ref 21–32)
CO2: 28 mmol/L (ref 21–32)
Calcium: 9.1 MG/DL (ref 8.5–10.1)
Calcium: 8.7 MG/DL (ref 8.5–10.1)
Chloride: 107 mmol/L (ref 100–108)
Chloride: 107 mmol/L (ref 100–108)
Creatinine: 1.1 MG/DL (ref 0.6–1.3)
Creatinine: 1 MG/DL (ref 0.6–1.3)
GFR est AA: >60 mL/min/{1.73_m2} (ref >60)
GFR est AA: >60 mL/min/{1.73_m2} (ref >60)
GFR est non-AA: >60 mL/min/{1.73_m2} (ref >60)
GFR est non-AA: >60 mL/min/{1.73_m2} (ref >60)
Globulin: 3.6 g/dL (ref 2.0–4.0)
Globulin: 3.4 g/dL (ref 2.0–4.0)
Glucose: 252 mg/dL — ABNORMAL HIGH (ref 74–99)
Glucose: 166 mg/dL — ABNORMAL HIGH (ref 74–99)
Potassium: 3.6 mmol/L (ref 3.5–5.5)
Potassium: 3.8 mmol/L (ref 3.5–5.5)
Protein, total: 7.1 g/dL (ref 6.4–8.2)
Protein, total: 6.7 g/dL (ref 6.4–8.2)
Sodium: 144 mmol/L (ref 136–145)
Sodium: 144 mmol/L (ref 136–145)

## 2014-06-08 LAB — CARDIAC PANEL,(CK, CKMB & TROPONIN)
CK - MB: 1.2 ng/ml (ref 0.5–3.6)
CK - MB: 1.3 ng/ml (ref 0.5–3.6)
CK - MB: 1.5 ng/ml (ref 0.5–3.6)
CK-MB Index: 1.4 % (ref 0.0–4.0)
CK-MB Index: 1.5 % (ref 0.0–4.0)
CK-MB Index: 1.4 % (ref 0.0–4.0)
CK: 87 U/L (ref 39–308)
CK: 88 U/L (ref 39–308)
CK: 106 U/L (ref 39–308)
Troponin-I, QT: <0.02 NG/ML (ref 0.00–0.06)
Troponin-I, QT: 0.02 NG/ML (ref 0.00–0.06)
Troponin-I, QT: <0.02 NG/ML (ref 0.00–0.06)

## 2014-06-08 LAB — CBC WITH AUTOMATED DIFF
ABS. BASOPHILS: 0 10*3/uL (ref 0.0–0.06)
ABS. BASOPHILS: 0 10*3/uL (ref 0.0–0.06)
ABS. EOSINOPHILS: 0.2 10*3/uL (ref 0.0–0.4)
ABS. EOSINOPHILS: 0.1 10*3/uL (ref 0.0–0.4)
ABS. LYMPHOCYTES: 1.9 10*3/uL (ref 0.9–3.6)
ABS. LYMPHOCYTES: 2.4 10*3/uL (ref 0.9–3.6)
ABS. MONOCYTES: 0.7 10*3/uL (ref 0.05–1.2)
ABS. MONOCYTES: 0.7 10*3/uL (ref 0.05–1.2)
ABS. NEUTROPHILS: 4.1 10*3/uL (ref 1.8–8.0)
ABS. NEUTROPHILS: 3.8 10*3/uL (ref 1.8–8.0)
BASOPHILS: 0 % (ref 0–2)
BASOPHILS: 0 % (ref 0–2)
EOSINOPHILS: 3 % (ref 0–5)
EOSINOPHILS: 1 % (ref 0–5)
HCT: 40.5 % (ref 36.0–48.0)
HCT: 37.6 % (ref 36.0–48.0)
HGB: 13.8 g/dL (ref 13.0–16.0)
HGB: 12.6 g/dL — ABNORMAL LOW (ref 13.0–16.0)
LYMPHOCYTES: 28 % (ref 21–52)
LYMPHOCYTES: 34 % (ref 21–52)
MCH: 29.6 PG (ref 24.0–34.0)
MCH: 29.1 PG (ref 24.0–34.0)
MCHC: 34.1 g/dL (ref 31.0–37.0)
MCHC: 33.5 g/dL (ref 31.0–37.0)
MCV: 86.7 FL (ref 74.0–97.0)
MCV: 86.8 FL (ref 74.0–97.0)
MONOCYTES: 10 % (ref 3–10)
MONOCYTES: 10 % (ref 3–10)
MPV: 10 FL (ref 9.2–11.8)
MPV: 9.6 FL (ref 9.2–11.8)
NEUTROPHILS: 59 % (ref 40–73)
NEUTROPHILS: 55 % (ref 40–73)
PLATELET: 223 10*3/uL (ref 135–420)
PLATELET: 224 10*3/uL (ref 135–420)
RBC: 4.67 M/uL — ABNORMAL LOW (ref 4.70–5.50)
RBC: 4.33 M/uL — ABNORMAL LOW (ref 4.70–5.50)
RDW: 12.6 % (ref 11.6–14.5)
RDW: 12.6 % (ref 11.6–14.5)
WBC: 6.8 10*3/uL (ref 4.6–13.2)
WBC: 7 10*3/uL (ref 4.6–13.2)

## 2014-06-08 LAB — DRUG SCREEN, URINE
AMPHETAMINES: NEGATIVE
BARBITURATES: NEGATIVE
BENZODIAZEPINES: NEGATIVE
COCAINE: NEGATIVE
METHADONE: NEGATIVE
OPIATES: NEGATIVE
PCP(PHENCYCLIDINE): NEGATIVE
THC (TH-CANNABINOL): NEGATIVE

## 2014-06-08 LAB — PROTHROMBIN TIME + INR
INR: 1 (ref 0.8–1.2)
Prothrombin time: 12.8 s (ref 11.5–15.2)

## 2014-06-08 LAB — GLUCOSE, POC
Glucose (POC): 182 mg/dL — ABNORMAL HIGH (ref 70–110)
Glucose (POC): 171 mg/dL — ABNORMAL HIGH (ref 70–110)
Glucose (POC): 107 mg/dL (ref 70–110)
Glucose (POC): 201 mg/dL — ABNORMAL HIGH (ref 70–110)

## 2014-06-08 LAB — HEMOGLOBIN A1C WITH EAG: Hemoglobin A1c: 11.8 % — ABNORMAL HIGH (ref 4.5–6.2)

## 2014-06-08 LAB — URINALYSIS W/ RFLX MICROSCOPIC
Bilirubin: NEGATIVE
Blood: NEGATIVE
Glucose: >1000 mg/dL — AB
Ketone: NEGATIVE mg/dL
Leukocyte Esterase: NEGATIVE
Nitrites: NEGATIVE
Protein: NEGATIVE mg/dL
Specific gravity: 1.02 (ref 1.003–1.030)
Urobilinogen: 1 EU/dL (ref 0.2–1.0)
pH (UA): 6 (ref 5.0–8.0)

## 2014-06-08 LAB — PTT: aPTT: 25.3 s (ref 24.6–37.7)

## 2014-06-08 MED ORDER — INSULIN GLARGINE 100 UNIT/ML INJECTION
100 unit/mL | Freq: Every day | SUBCUTANEOUS | Status: DC
Start: 2014-06-08 — End: 2014-06-09
  Administered 2014-06-08: 12:00:00 via SUBCUTANEOUS

## 2014-06-08 MED ORDER — GLUCAGON 1 MG INJECTION
1 mg | INTRAMUSCULAR | Status: DC | PRN
Start: 2014-06-08 — End: 2014-06-09

## 2014-06-08 MED ORDER — FENTANYL 25 MCG/HR 72 HR TRANSDERM PATCH
25 mcg/hr | TRANSDERMAL | Status: DC
Start: 2014-06-08 — End: 2014-06-09

## 2014-06-08 MED ORDER — CLOPIDOGREL 75 MG TAB
75 mg | Freq: Every day | ORAL | Status: DC
Start: 2014-06-08 — End: 2014-06-09
  Administered 2014-06-08: 12:00:00 via ORAL

## 2014-06-08 MED ORDER — SODIUM CHLORIDE 0.9 % IV
INTRAVENOUS | Status: DC
Start: 2014-06-08 — End: 2014-06-09
  Administered 2014-06-08 (×2): via INTRAVENOUS

## 2014-06-08 MED ORDER — ACETAMINOPHEN 325 MG TABLET
325 mg | ORAL | Status: DC | PRN
Start: 2014-06-08 — End: 2014-06-09

## 2014-06-08 MED ORDER — MORPHINE 2 MG/ML INJECTION
2 mg/mL | INTRAMUSCULAR | Status: DC | PRN
Start: 2014-06-08 — End: 2014-06-09
  Administered 2014-06-08 (×2): via INTRAVENOUS

## 2014-06-08 MED ORDER — DEXTROSE 50% IN WATER (D50W) IV SYRG
INTRAVENOUS | Status: DC | PRN
Start: 2014-06-08 — End: 2014-06-09

## 2014-06-08 MED ORDER — GLUCOSE 4 GRAM CHEWABLE TAB
4 gram | ORAL | Status: DC | PRN
Start: 2014-06-08 — End: 2014-06-09

## 2014-06-08 MED ORDER — NITROGLYCERIN 0.4 MG SUBLINGUAL TAB
0.4 mg | SUBLINGUAL | Status: DC | PRN
Start: 2014-06-08 — End: 2014-06-09

## 2014-06-08 MED ORDER — ATENOLOL 25 MG TAB
25 mg | Freq: Two times a day (BID) | ORAL | Status: DC
Start: 2014-06-08 — End: 2014-06-09
  Administered 2014-06-08 (×2): via ORAL

## 2014-06-08 MED ORDER — INSULIN LISPRO 100 UNIT/ML INJECTION
100 unit/mL | Freq: Four times a day (QID) | SUBCUTANEOUS | Status: DC
Start: 2014-06-08 — End: 2014-06-09
  Administered 2014-06-08 (×2): via SUBCUTANEOUS

## 2014-06-08 MED ORDER — DIPHENHYDRAMINE HCL 50 MG/ML IJ SOLN
50 mg/mL | INTRAMUSCULAR | Status: DC | PRN
Start: 2014-06-08 — End: 2014-06-09

## 2014-06-08 MED ORDER — SIMVASTATIN 10 MG TAB
10 mg | Freq: Every evening | ORAL | Status: DC
Start: 2014-06-08 — End: 2014-06-09
  Administered 2014-06-08: 04:00:00 via ORAL

## 2014-06-08 MED ORDER — ACETAMINOPHEN 325 MG TABLET
325 mg | ORAL | Status: AC
Start: 2014-06-08 — End: 2014-06-07
  Administered 2014-06-08: 02:00:00 via ORAL

## 2014-06-08 MED ORDER — TECHNETIUM TC 99M SESTAMIBI - CARDIOLITE
Freq: Once | Status: AC
Start: 2014-06-08 — End: 2014-06-08
  Administered 2014-06-08: 13:00:00 via INTRAVENOUS

## 2014-06-08 MED ORDER — ONDANSETRON (PF) 4 MG/2 ML INJECTION
4 mg/2 mL | Freq: Three times a day (TID) | INTRAMUSCULAR | Status: DC | PRN
Start: 2014-06-08 — End: 2014-06-09
  Administered 2014-06-08: 18:00:00 via INTRAVENOUS

## 2014-06-08 MED ORDER — ONDANSETRON (PF) 4 MG/2 ML INJECTION
4 mg/2 mL | Freq: Once | INTRAMUSCULAR | Status: DC
Start: 2014-06-08 — End: 2014-06-09

## 2014-06-08 MED ORDER — NITROGLYCERIN 2 % TRANSDERMAL OINTMENT
2 % | Freq: Two times a day (BID) | TRANSDERMAL | Status: DC
Start: 2014-06-08 — End: 2014-06-08
  Administered 2014-06-08 (×2): via TOPICAL

## 2014-06-08 MED ORDER — ASPIRIN 81 MG CHEWABLE TAB
81 mg | Freq: Every day | ORAL | Status: DC
Start: 2014-06-08 — End: 2014-06-09
  Administered 2014-06-08: 12:00:00 via ORAL

## 2014-06-08 MED ORDER — SODIUM CHLORIDE 0.9 % IJ SYRG
INTRAMUSCULAR | Status: DC | PRN
Start: 2014-06-08 — End: 2014-06-09

## 2014-06-08 MED ORDER — ACETAMINOPHEN (TYLENOL) SOLUTION 32MG/ML
ORAL | Status: DC | PRN
Start: 2014-06-08 — End: 2014-06-09

## 2014-06-08 MED ORDER — OXYCODONE-ACETAMINOPHEN 7.5 MG-325 MG TAB
Freq: Three times a day (TID) | ORAL | Status: DC | PRN
Start: 2014-06-08 — End: 2014-06-09
  Administered 2014-06-08: 04:00:00 via ORAL

## 2014-06-08 MED ORDER — HEPARIN (PORCINE) 5,000 UNIT/ML IJ SOLN
5000 unit/mL | Freq: Three times a day (TID) | INTRAMUSCULAR | Status: DC
Start: 2014-06-08 — End: 2014-06-09
  Administered 2014-06-08 (×2): via SUBCUTANEOUS

## 2014-06-08 MED ORDER — SODIUM CHLORIDE 0.9 % IJ SYRG
Freq: Three times a day (TID) | INTRAMUSCULAR | Status: DC
Start: 2014-06-08 — End: 2014-06-09
  Administered 2014-06-08 (×2): via INTRAVENOUS

## 2014-06-08 MED ORDER — PROMETHAZINE 25 MG TAB
25 mg | Freq: Four times a day (QID) | ORAL | Status: DC | PRN
Start: 2014-06-08 — End: 2014-06-09

## 2014-06-08 MED ORDER — TECHNETIUM TC 99M SESTAMIBI - CARDIOLITE
Freq: Once | Status: AC
Start: 2014-06-08 — End: 2014-06-08
  Administered 2014-06-08: 15:00:00 via INTRAVENOUS

## 2014-06-08 MED FILL — ATENOLOL 25 MG TAB: 25 mg | ORAL | Qty: 1

## 2014-06-08 MED FILL — BD POSIFLUSH NORMAL SALINE 0.9 % INJECTION SYRINGE: INTRAMUSCULAR | Qty: 10

## 2014-06-08 MED FILL — FENTANYL 25 MCG/HR 72 HR TRANSDERM PATCH: 25 mcg/hr | TRANSDERMAL | Qty: 1

## 2014-06-08 MED FILL — HUMALOG U-100 INSULIN 100 UNIT/ML SUBCUTANEOUS SOLUTION: 100 unit/mL | SUBCUTANEOUS | Qty: 3

## 2014-06-08 MED FILL — NITRO-BID 2 % TRANSDERMAL OINTMENT: 2 % | TRANSDERMAL | Qty: 1

## 2014-06-08 MED FILL — HEPARIN (PORCINE) 5,000 UNIT/ML IJ SOLN: 5000 unit/mL | INTRAMUSCULAR | Qty: 1

## 2014-06-08 MED FILL — TECHNETIUM TC 99M SESTAMIBI - CARDIOLITE: Qty: 32

## 2014-06-08 MED FILL — SIMVASTATIN 10 MG TAB: 10 mg | ORAL | Qty: 2

## 2014-06-08 MED FILL — ONDANSETRON (PF) 4 MG/2 ML INJECTION: 4 mg/2 mL | INTRAMUSCULAR | Qty: 2

## 2014-06-08 MED FILL — SODIUM CHLORIDE 0.9 % IV: INTRAVENOUS | Qty: 1000

## 2014-06-08 MED FILL — TECHNETIUM TC 99M SESTAMIBI - CARDIOLITE: Qty: 11

## 2014-06-08 MED FILL — MORPHINE 2 MG/ML INJECTION: 2 mg/mL | INTRAMUSCULAR | Qty: 1

## 2014-06-08 MED FILL — MAPAP (ACETAMINOPHEN) 325 MG TABLET: 325 mg | ORAL | Qty: 2

## 2014-06-08 MED FILL — ASPIRIN 81 MG CHEWABLE TAB: 81 mg | ORAL | Qty: 1

## 2014-06-08 MED FILL — OXYCODONE-ACETAMINOPHEN 7.5 MG-325 MG TAB: ORAL | Qty: 1

## 2014-06-08 MED FILL — PLAVIX 75 MG TABLET: 75 mg | ORAL | Qty: 1
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
## 2014-06-08 NOTE — Other (Signed)
Spoke with patient and discussed insulin regime at home-takes lantus 30-80 units daily and NovoLOG ~30 units AC.  Reports missed doses evening of admit but denied that has routinely missed doses of insulin.  Introduced A1C result and that correlated with average glucose of 300.  He stated "well you know I have been eating the wrong food and have been under a lot of stress."    Current insulin orders are for Lantus 40 units 1x/day and ,corrective coverage.  Currently NPO for procedure.  Lab Results   Component Value Date/Time    HEMOGLOBIN A1C 11.8 06/08/2014 02:44 AM     Lab Results   Component Value Date/Time    GLUCOSE 166 06/08/2014 02:44 AM    GLUCOSE (POC) 171 06/08/2014 06:28 AM

## 2014-06-08 NOTE — Other (Signed)
Bedside and Verbal shift change report given to R. Nomel (oncoming nurse) by Jolene ProvostKayla B Estes, RN   (offgoing nurse). Report included the following information SBAR.   Pt discharged waiting for taxi to arrive.

## 2014-06-08 NOTE — Progress Notes (Signed)
NUTRITION ASSESSMENT    ASSESSMENT:     Diet:  NPO  Food Allergies:  NKFA  PO Intake:    Good       Fair       Poor       N/A     No data found.       No cultural, religious, or ethnic dietary needs identified    Cultural, religious and ethnic food preferences identified and addressed        Patient Active Problem List   Diagnosis Code   ??? Chest pain 786.50       Past Medical History   Diagnosis Date   ??? CAD (coronary artery disease)    ??? Diabetes ([place])    ??? MI (myocardial infarction) ([place])    ??? Hypertension    ??? High cholesterol    ??? Ill-defined condition      prostate problems   ??? Chronic pain    ??? Arthritis    ??? GERD (gastroesophageal reflux disease)        Skin:      Reviewed    Medications:     Reviewed; NS at 100 ml/hr, tenormin, plavix, heparin, lantus, SSI, zocor    Labs:       Reviewed   Lab Results   Component Value Date/Time    SODIUM 144 06/08/2014 02:44 AM    POTASSIUM 3.8 06/08/2014 02:44 AM    CHLORIDE 107 06/08/2014 02:44 AM    CO2 28 06/08/2014 02:44 AM    ANION GAP 9 06/08/2014 02:44 AM    GLUCOSE 166 06/08/2014 02:44 AM    BUN 16 06/08/2014 02:44 AM    CREATININE 1.00 06/08/2014 02:44 AM    CALCIUM 8.7 06/08/2014 02:44 AM    MAGNESIUM 2.1 01/14/2013 11:50 AM    ALBUMIN 3.3 06/08/2014 02:44 AM    HEMOGLOBIN A1C 11.8 06/08/2014 02:44 AM       Anthropometrics:  IBW : 78.019 kg (172 lb) (155-189#)  BMI (calculated): 23.8    Last 3 Recorded Weights in this Encounter    06/07/14 2111 06/08/14 0343   Weight: 83.915 kg (185 lb) 77.111 kg (170 lb)      Ht Readings from Last 1 Encounters:   06/08/14 _0  (1.803 m)       Estimated Nutrition Needs:  Kcals/day (Adult): 2090 Kcals/day (2090-2250 kcals/d (MSJ x 1.3-1.4 (PAL))  Protein (g): 80 g (80-95 g/d (1.0-1.2 g/kg))  Fluid (ml): 2090 ml (2090-2250 ml/d (72m/kcal))    Based on:    Actual Wt       IBW    Pt expected to meet estimated nutrient needs:     Yes       No; NPO    Summary:  [facility] MST RISK Screening Referral received noting 14-23# weight loss/poor PO  PTA.    Pt admitted with CP, ACS. Pt NPO for stress test today. Noted possible weight loss/poor PO PTA, PO nutrition intervention not currently indicated d/t NPO status, will continue to monitor advancement of diet, intakes, as diet advanced. Diet education not indicated at this time, will reassess for diet education needs, upon f/u.    DIAGNOSIS:   1.   Altered nutrition related laboratory values R/T uncontrolled DM AEB a1c of 11.8    Goals:  1. Upon f/u, pt's blood glucose will be within target, per glycemic control    INTERVENTIONS:   1. Meals/Snacks: NPO status, recommend advancing diet to consistent carbohydrate, only as medically  appropriate s/p procedure  2. Coordination of care: MD, glycemic control to adjust insulin to control blood glucose levels    MONITORING/EVALUATION:   1. Monitor advancement of diet, intakes, tolerance, as diet advanced  2. Monitor weight  3. Reassess for diet education needs, upon f/u  Evaluate per Nutrition Risk protocol:    High       Moderate       Minimal/Uncompromised    Previous Recommendations:    Implemented       Not Implemented (RD to address)      Not applicable   Previous Goal:    Met       Not Met (RD to address)       Not applicable     Discharge Planning:  Further nutrition recommendations, pending advancement of diet, ability of pt to tolerate regular consistency diet    Ernestina Patches, RD

## 2014-06-08 NOTE — Progress Notes (Signed)
Virtual reviewer communicated change to CM which reflect outpatient observation order written prior to discharge order being written. CM met with the patient and provided to the patient the printed information about her outpatient observation status. The patient was given the flyer entitled, "Medicare Outpatient Observation: Information & notification." All questions were answered, no additional discharge needs identified at this time and patient expects to return to their (home, assisted living facility, relatives home, etc.) after discharge today.

## 2014-06-08 NOTE — Procedures (Signed)
Pt exercised for 9 min on bruce protocol achieving a heart rate of 131 which was 81% of age predicted max  Had to stop with back and knee pain    Normal up to 81% of age predicted max  Nuclear images to follow    Patrica Mendell, MD

## 2014-06-08 NOTE — Progress Notes (Signed)
Chaplain conducted an initial consultation and Spiritual Assessment for Tracy [name], who is a 58 y.o.,male. According to the patient???s chart Religious Affiliation is: No religion.     Patient was tired from "a long night followed by tests today - but good."  Reviewed Advance Medical Directives with patient and left a copy with him. 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     The reason the Patient came to the hospital is:   Patient Active Problem List    Diagnosis Date Noted   ??? Chest pain 06/07/2014   ??? Drug overdose 08/28/2013        The Chaplain provided the following Interventions:  Initiated a relationship of care and support.   Explored issues of faith, belief, spirituality and religious/ritual needs while hospitalized.  Listened empathically.  Provided chaplaincy education.  Provided information about Spiritual Care Services.  Offered prayer and assurance of continued prayers on patients behalf.   Chart reviewed.    The following outcomes were achieved:  Patient shared limited information about their medical narrative, spiritual journey and beliefs.  Chaplain confirmed Patient's Religious Affiliation.  Patient processed feelings about current hospitalization.  Patient expressed gratitude for Spiritual Care visit.    Assessment:  There are no significant spiritual or religious issues which require further intervention at this time.   Patient does not have any religious or cultural needs that will affect patient???s preferences in health care.    Plan:  Chaplains will continue to follow and will provide pastoral care as needed or requested.   Chaplain recommends bedside caregivers page chaplain on duty if patient shows signs of acute spiritual or emotional distress.    Chaplain Marica Otter, M.Div.  Board Certified Chaplain  [phone] - Office

## 2014-06-08 NOTE — Procedures (Signed)
Full report dictated    Normal nuclear scan with no evidence for ischemia.   Normal EF    No additional inpatient cardiac plans  Continue meds  Secondary prevention    Buren Havey, MD
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
## 2014-06-08 NOTE — Progress Notes (Signed)
Hospitalist Progress Note    Patient: Nicolus Ose MRN: [id number]  CSN: [id number]    Date of Birth: [birth year]  Age: 58 y.o.  Sex: male    DOA: 06/07/2014 LOS:  LOS: 1 day            Assessment/Plan   1. Chest pain, cardiac enzymes  Negative x2, suspect due to anxiety  2. CAD  3  DM2 controlled  4. Chronic pain    Plan:  - will send for stress test today  -  Continue aspirin, plavix, atenolol, ntg, zocor  - cardiology consulted overnight              Subjective:    cc: chest pain  Pt is chest pain free, denies dyspnea, diaphoresis, wheezing      REVIEW OF SYSTEMS:  General: No fevers or chills.  Cardiovascular: No chest pain or pressure. No palpitations.   Pulmonary: No shortness of breath.   Gastrointestinal: No nausea, vomiting.     Objective:        Vital signs/Intake and Output:  Visit Vitals   Item Reading   ??? BP 114/75 mmHg   ??? Pulse 65   ??? Temp 97.9 ??F (36.6 ??C)   ??? Resp 16   ??? Ht 5\' 11"  (1.803 m)   ??? Wt 77.111 kg (170 lb)   ??? BMI 23.72 kg/m2   ??? SpO2 100%     Current Shift:     Last three shifts:  08/16 1900 - 08/18 0659  In: 120 [P.O.:120]  Out: -     Body mass index is 23.72 kg/(m^2).    Physical Exam:  GEN: Alert and oriented times three NAD  EYES: conjunctiva normal, lids with out lesions  HEENT: MMM, No thyromegaly, no lymphadenopathy  HEART: RRR +S1 +S2, no JVD, pulses 2+ distally  LUNGS: CTA B/L no wheezes, rales or rhonchi  ABDOMEN: + BS, soft NT/ND no organomegaly,  No rebound  EXTREMITIES: No edema cyanosis   SKIN: no rashes or skin breakdown, no nodules  Current Facility-Administered Medications   Medication Dose Route Frequency   ??? sodium chloride (NS) flush 5-10 mL  5-10 mL IntraVENous Q8H   ??? sodium chloride (NS) flush 5-10 mL  5-10 mL IntraVENous PRN   ??? nitroglycerin (NITROBID) 2 % ointment 1 Inch  1 Inch Topical BID   ??? morphine injection 2 mg  2 mg IntraVENous Q4H PRN   ??? acetaminophen (TYLENOL) solution 650 mg  650 mg Oral Q4H PRN    ??? diphenhydrAMINE (BENADRYL) injection 12.5 mg  12.5 mg IntraVENous Q4H PRN   ??? ondansetron (ZOFRAN) injection 4 mg  4 mg IntraVENous Q8H PRN   ??? 0.9% sodium chloride infusion  100 mL/hr IntraVENous CONTINUOUS   ??? heparin (porcine) injection 5,000 Units  5,000 Units SubCUTAneous Q8H   ??? insulin lispro (HUMALOG) injection   SubCUTAneous AC&HS   ??? glucose chewable tablet 16 g  16 g Oral PRN   ??? glucagon (GLUCAGEN) injection 1 mg  1 mg IntraMUSCular PRN   ??? dextrose (D50W) injection syrg 25 g  50 mL IntraVENous PRN   ??? nitroglycerin (NITROSTAT) tablet 0.4 mg  0.4 mg SubLINGual PRN   ??? aspirin chewable tablet 81 mg  81 mg Oral DAILY   ??? fentaNYL (DURAGESIC) 25 mcg/hr patch 1 Patch  1 Patch TransDERmal Q72H   ??? oxyCODONE-acetaminophen (PERCOCET 7.5) 7.5-325 mg per tablet 1 Tab  1 Tab Oral Q8H PRN   ??? clopidogrel (PLAVIX)  tablet 75 mg  75 mg Oral DAILY   ??? atenolol (TENORMIN) tablet 25 mg  25 mg Oral BID   ??? simvastatin (ZOCOR) tablet 20 mg  20 mg Oral QHS   ??? insulin glargine (LANTUS) injection 40 Units  40 Units SubCUTAneous DAILY         All the patient's labs over the past 24 hours were reviewed both during my initial daily workflow process and at the time notated as "note time" in [facility].  (It is not time stamped separately in this workflow.)  Select labs are listed below.        Labs: Results:       Chemistry Recent Labs      06/08/14   0244  06/07/14   2115   GLU  166*  252*   NA  144  144   K  3.8  3.6   CL  107  107   CO2  28  26   BUN  16  16   CREA  1.00  1.10   CA  8.7  9.1   AGAP  9  11   BUCR  16  15   AP  30*  39*   TP  6.7  7.1   ALB  3.3*  3.5   GLOB  3.4  3.6   AGRAT  1.0  1.0      CBC w/Diff Recent Labs      06/08/14   0244  06/07/14   2115   WBC  7.0  6.8   RBC  4.33*  4.67*   HGB  12.6*  13.8   HCT  37.6  40.5   PLT  224  223   GRANS  55  59   LYMPH  34  28   EOS  1  3      Cardiac Enzymes Recent Labs      06/08/14   0244  06/07/14   2115   CPK  88  87   CKND1  1.5  1.4     Coagulation Recent Labs      06/07/14   2115   PTP  12.8   INR  1.0   APTT  25.3               Liver Enzymes Recent Labs      06/08/14   0244   TP  6.7   ALB  3.3*   AP  30*   SGOT  20          Procedures/imaging: see electronic medical records for all procedures/Xrays and details which were not copied into this note but were reviewed prior to creation of Plan    Imaging personally reviewed:  EKG: nothing acute  CXR: clear            Esperanza RichtersJacob Ashni Lonzo, DO  Internal Medicine/Geriatrics

## 2014-06-08 NOTE — Consults (Signed)
Full consult dictated    CAD with h/o MI and RCA stents  Has been stable    Atypical chest pain  Enzymes negative    Stress test today  Continue home meds    Orlinda BlalockAnthony Lorelle Macaluso, MD
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
## 2014-06-08 NOTE — Progress Notes (Signed)
Stress portion of nuclear stress test completed.  NTG patch/ointment removed prior to walking per verbal order of Dr. Tereasa CoopLombardo.

## 2014-06-08 NOTE — Other (Signed)
Dual AVS reviewed with S. Earlene Plater.  All medications reviewed individually with patient.  Opportunities for questions and concerns provided.  Patient discharged via (mode of transport ie. Car, ambulance or air transport) taxi  Patient's arm band appropriately discarded.

## 2014-06-08 NOTE — Procedures (Signed)
Full report dictated    Normal nuclear scan with no evidence for ischemia.   Normal EF    No additional inpatient cardiac plans  Continue meds  Secondary prevention    Orlinda Blalock, MD

## 2014-06-08 NOTE — Procedures (Signed)
Pt exercised for 9 min on bruce protocol achieving a heart rate of 131 which was 81% of age predicted max  Had to stop with back and knee pain    Normal up to 81% of age predicted max  Nuclear images to follow    Orlinda Blalock, MD

## 2014-06-09 LAB — EKG, 12 LEAD, INITIAL
Atrial Rate: 110 {beats}/min
Calculated P Axis: 69 degrees
Calculated R Axis: -12 degrees
Calculated T Axis: 77 degrees
P-R Interval: 184 ms
Q-T Interval: 340 ms
QRS Duration: 74 ms
QTC Calculation (Bezet): 460 ms
Ventricular Rate: 110 {beats}/min

## 2014-10-13 ENCOUNTER — Inpatient Hospital Stay
Admit: 2014-10-13 | Discharge: 2014-10-13 | Disposition: A | Discharge status: Home / Self Care | Payer: MEDICARE | Attending: Emergency Medicine

## 2014-10-13 DIAGNOSIS — K611 Rectal abscess: Secondary | ICD-10-CM

## 2014-10-13 MED ORDER — OXYCODONE-ACETAMINOPHEN 5 MG-325 MG TAB
5-325 mg | ORAL_TABLET | Freq: Three times a day (TID) | ORAL | Status: DC | PRN
Start: 2014-10-13 — End: 2014-10-18

## 2014-10-13 MED ORDER — DOCUSATE SODIUM 100 MG CAP
100 mg | ORAL_CAPSULE | Freq: Two times a day (BID) | ORAL | Status: AC
Start: 2014-10-13 — End: ?

## 2014-10-13 MED ORDER — METRONIDAZOLE 500 MG TAB
500 mg | ORAL_TABLET | Freq: Two times a day (BID) | ORAL | Status: AC
Start: 2014-10-13 — End: 2014-10-20

## 2014-10-13 MED ORDER — CIPROFLOXACIN 500 MG TAB
500 mg | ORAL_TABLET | Freq: Two times a day (BID) | ORAL | Status: AC
Start: 2014-10-13 — End: 2014-10-20

## 2014-10-13 NOTE — ED Notes (Signed)
I have reviewed discharge instructions with the patient.  The patient verbalized understanding.

## 2014-10-13 NOTE — ED Notes (Signed)
Triage note: pt reports abscess to left groin area onset 3 days ago. Pt reports some drainage.

## 2014-10-13 NOTE — ED Provider Notes (Signed)
HPI Comments:   11:45 AM   Tracy [name] is a 58 y.o. male presenting with family member to the ED C/O abscess to rectal area x 4-5 days. NKDA. Pt denies any other Sx or complaints.      Patient is a 58 y.o. male presenting with abscess. The history is provided by the patient. No language interpreter was used.   Abscess   This is a new problem. The current episode started more than 2 days ago. The problem has been gradually worsening. Affected Location: rectal area.   Written by Salina April, ED Scribe, as dictated by Romero Liner, PA-C.     Past Medical History:   Diagnosis Date   ??? CAD (coronary artery disease)    ??? Diabetes (HCC)    ??? MI (myocardial infarction) (HCC)    ??? Hypertension    ??? High cholesterol    ??? Ill-defined condition      prostate problems   ??? Chronic pain    ??? Arthritis    ??? GERD (gastroesophageal reflux disease)        Past Surgical History:   Procedure Laterality Date   ??? Pr cardiac surg procedure unlist     ??? Hx coronary stent placement           Family History:   Problem Relation Age of Onset   ??? Diabetes Mother    ??? Heart Disease Mother    ??? Diabetes Father        History     Social History   ??? Marital Status: SINGLE     Spouse Name: N/A     Number of Children: N/A   ??? Years of Education: N/A     Occupational History   ??? Not on file.     Social History Main Topics   ??? Smoking status: Former Smoker   ??? Smokeless tobacco: Not on file   ??? Alcohol Use: Yes   ??? Drug Use: Not on file   ??? Sexual Activity: Not on file     Other Topics Concern   ??? Not on file     Social History Narrative        ALLERGIES: Review of patient's allergies indicates no known allergies.      Review of Systems   Constitutional: Negative for fever and fatigue.   HENT: Negative for rhinorrhea and sore throat.    Respiratory: Negative for cough and shortness of breath.    Cardiovascular: Negative for chest pain and palpitations.   Gastrointestinal: Negative for nausea, vomiting, abdominal pain and diarrhea.    Genitourinary: Negative for dysuria and difficulty urinating.   Musculoskeletal: Negative for myalgias and arthralgias.   Skin: Positive for wound (abscess to rectal area). Negative for color change and rash.   Neurological: Negative for light-headedness and headaches.       Filed Vitals:    10/13/14 1138   BP: 130/65   Pulse: 97   Temp: 99.3 ??F (37.4 ??C)   Resp: 18   Height: 5\' 11"  (1.803 m)   Weight: 88.451 kg (195 lb)   SpO2: 97%            Physical Exam   Constitutional: He appears well-developed and well-nourished. No distress.   HENT:   Head: Normocephalic and atraumatic.   Right Ear: Tympanic membrane and ear canal normal.   Left Ear: Tympanic membrane and ear canal normal.   Mouth/Throat: Uvula is midline and mucous membranes are normal.   Neck:  Trachea normal and full passive range of motion without pain.   Cardiovascular: Normal rate, regular rhythm, normal heart sounds and normal pulses.    Pulmonary/Chest: Effort normal and breath sounds normal.   Abdominal: Normal appearance.   Genitourinary:         Musculoskeletal: Normal range of motion.   Neurological: He is alert. He has normal strength and normal reflexes.   Skin: Skin is warm and dry. He is not diaphoretic.   Psychiatric: He has a normal mood and affect. His speech is normal and behavior is normal. Judgment normal.   Nursing note and vitals reviewed.     RESULTS:    No orders to display        Labs Reviewed - No data to display    No results found for this or any previous visit (from the past 12 hour(s)).      MDM  Number of Diagnoses or Management Options  Perirectal abscess:      Amount and/or Complexity of Data Reviewed  Discuss the patient with other providers: yes Frankey Poot(Steven Hopson, MD (General surgery))      MEDICATIONS GIVEN:  Medications - No data to display     Procedures    PROGRESS NOTE:   11:45 AM  Initial assessment completed.  Written by Salina AprilNick Doyle, ED Scribe, as dictated by Romero Linerobert Samaiyah Howes, PA-C.    CONSULT NOTE:   11:53 AM   Romero Linerobert Jemuel Laursen, PA-C spoke with Frankey PootSteven Hopson, MD   Specialty: General surgery  Discussed pt's hx, disposition, and available diagnostic and imaging results over the telephone. Reviewed care plans. States to discharge pt with antibiotics and warm soaks. He said to have pt return to ED in 2 days if not improving, at which point he will need a CT scan to define the abscess.   Written by Riki SheerNicklaus C Doyle, ED Scribe, as dictated by Romero Linerobert Maygen Sirico, PA-C.     DISCHARGE NOTE:  12:00 PM   Tracy [name]'s  results have been reviewed with him.  He has been counseled regarding his diagnosis, treatment, and plan.  He verbally conveys understanding and agreement of the signs, symptoms, diagnosis, treatment and prognosis and additionally agrees to follow up as discussed.  He also agrees with the care-plan and conveys that all of his questions have been answered.  I have also provided discharge instructions for him that include: educational information regarding their diagnosis and treatment, and list of reasons why they would want to return to the ED prior to their follow-up appointment, should his condition change.     CLINICAL IMPRESSION:    1. Perirectal abscess        AFTER VISIT PLAN:    Current Discharge Medication List      START taking these medications    Details   oxyCODONE-acetaminophen (PERCOCET) 5-325 mg per tablet Take 1 Tab by mouth every eight (8) hours as needed for Pain. Max Daily Amount: 3 Tabs.  Qty: 15 Tab, Refills: 0      ciprofloxacin HCl (CIPRO) 500 mg tablet Take 1 Tab by mouth two (2) times a day for 7 days.  Qty: 14 Tab, Refills: 0      metroNIDAZOLE (FLAGYL) 500 mg tablet Take 1 Tab by mouth two (2) times a day for 7 days.  Qty: 14 Tab, Refills: 0      docusate sodium (COLACE) 100 mg capsule Take 1 Cap by mouth two (2) times a day.  Qty: 20 Cap, Refills: 2  CONTINUE these medications which have NOT CHANGED    Details   insulin aspart (NOVOLOG) 100 unit/mL injection 30 Units by SubCUTAneous  route Before breakfast, lunch, and dinner.      aspirin 81 mg chewable tablet Take 81 mg by mouth daily.      insulin glargine (LANTUS SOLOSTAR) 100 unit/mL (3 mL) pen 30-50 Units by SubCUTAneous route.      clopidogrel (PLAVIX) 75 mg tablet Take 75 mg by mouth daily.      ATENOLOL PO Take 25 mg by mouth two (2) times a day.      SIMVASTATIN PO Take 20 mg by mouth daily.              Follow-up Information     Follow up With Details Comments Contact Info    Frankey PootSteven Hopson, MD  Call to make an appointment for surgical follow up [address]  [address]  [facility]  [place] [id number]  [phone]      [facility]  Return in 2 days if symptoms worsen or do not improve [address]  [place] [id number]  [phone]           Written by Salina AprilNick Doyle, ED Scribe, as dictated by Romero Linerobert Sayda Grable, PA-C.    I agree with the above documentation as written by the scribe.   -Romero Linerobert Sanjit Mcmichael, PA-C.

## 2014-10-18 ENCOUNTER — Inpatient Hospital Stay
Admit: 2014-10-18 | Discharge: 2014-10-18 | Disposition: A | Discharge status: Home / Self Care | Payer: MEDICARE | Attending: Emergency Medicine

## 2014-10-18 DIAGNOSIS — K611 Rectal abscess: Secondary | ICD-10-CM

## 2014-10-18 LAB — CBC WITH AUTOMATED DIFF
ABS. BASOPHILS: 0 10*3/uL (ref 0.0–0.06)
ABS. EOSINOPHILS: 0 10*3/uL (ref 0.0–0.4)
ABS. LYMPHOCYTES: 1.4 10*3/uL (ref 0.9–3.6)
ABS. MONOCYTES: 2.3 10*3/uL — ABNORMAL HIGH (ref 0.05–1.2)
ABS. NEUTROPHILS: 16.8 10*3/uL — ABNORMAL HIGH (ref 1.8–8.0)
BASOPHILS: 0 % (ref 0–2)
EOSINOPHILS: 0 % (ref 0–5)
HCT: 40.6 % (ref 36.0–48.0)
HGB: 14.2 g/dL (ref 13.0–16.0)
LYMPHOCYTES: 7 % — ABNORMAL LOW (ref 21–52)
MCH: 29.8 PG (ref 24.0–34.0)
MCHC: 35 g/dL (ref 31.0–37.0)
MCV: 85.1 FL (ref 74.0–97.0)
MONOCYTES: 11 % — ABNORMAL HIGH (ref 3–10)
MPV: 9.9 FL (ref 9.2–11.8)
NEUTROPHILS: 82 % — ABNORMAL HIGH (ref 40–73)
PLATELET: 321 10*3/uL (ref 135–420)
RBC: 4.77 M/uL (ref 4.70–5.50)
RDW: 12 % (ref 11.6–14.5)
WBC: 20.4 10*3/uL — ABNORMAL HIGH (ref 4.6–13.2)

## 2014-10-18 LAB — METABOLIC PANEL, COMPREHENSIVE
A-G Ratio: 0.6 — ABNORMAL LOW (ref 0.8–1.7)
ALT (SGPT): 32 U/L (ref 16–61)
AST (SGOT): 26 U/L (ref 15–37)
Albumin: 2.9 g/dL — ABNORMAL LOW (ref 3.4–5.0)
Alk. phosphatase: 44 U/L — ABNORMAL LOW (ref 45–117)
Anion gap: 19 mmol/L — ABNORMAL HIGH (ref 3.0–18)
BUN/Creatinine ratio: 17 (ref 12–20)
BUN: 19 MG/DL — ABNORMAL HIGH (ref 7.0–18)
Bilirubin, total: 0.7 MG/DL (ref 0.2–1.0)
CO2: 21 mmol/L (ref 21–32)
Calcium: 8.5 MG/DL (ref 8.5–10.1)
Chloride: 90 mmol/L — ABNORMAL LOW (ref 100–108)
Creatinine: 1.14 MG/DL (ref 0.6–1.3)
GFR est AA: >60 mL/min/{1.73_m2} (ref >60)
GFR est non-AA: >60 mL/min/{1.73_m2} (ref >60)
Globulin: 4.9 g/dL — ABNORMAL HIGH (ref 2.0–4.0)
Glucose: 300 mg/dL — ABNORMAL HIGH (ref 74–99)
Potassium: 3.8 mmol/L (ref 3.5–5.5)
Protein, total: 7.8 g/dL (ref 6.4–8.2)
Sodium: 130 mmol/L — ABNORMAL LOW (ref 136–145)

## 2014-10-18 LAB — HEMOGLOBIN A1C WITH EAG
Est. average glucose: 295 mg/dL
Hemoglobin A1c: 11.9 % — ABNORMAL HIGH (ref 4.5–6.2)

## 2014-10-18 LAB — GLUCOSE, POC
Glucose (POC): 284 mg/dL — ABNORMAL HIGH (ref 70–110)
Glucose (POC): 212 mg/dL — ABNORMAL HIGH (ref 70–110)
Glucose (POC): 317 mg/dL — ABNORMAL HIGH (ref 70–110)

## 2014-10-18 LAB — PROTHROMBIN TIME + INR
INR: 1.3 — ABNORMAL HIGH (ref 0.8–1.2)
Prothrombin time: 16.5 s — ABNORMAL HIGH (ref 11.5–15.2)

## 2014-10-18 MED ORDER — HYDROMORPHONE 2 MG/ML INJECTION SOLUTION
2 mg/mL | INTRAMUSCULAR | Status: DC | PRN
Start: 2014-10-18 — End: 2014-10-18

## 2014-10-18 MED ORDER — NALOXONE 0.4 MG/ML INJECTION
0.4 mg/mL | INTRAMUSCULAR | Status: DC | PRN
Start: 2014-10-18 — End: 2014-10-18

## 2014-10-18 MED ORDER — DEXTROSE 50% IN WATER (D50W) IV SYRG
INTRAVENOUS | Status: DC | PRN
Start: 2014-10-18 — End: 2014-10-18

## 2014-10-18 MED ORDER — PIPERACILLIN-TAZOBACTAM 3.375 GRAM IV SOLR
3.375 gram | Freq: Four times a day (QID) | INTRAVENOUS | Status: DC
Start: 2014-10-18 — End: 2014-10-19
  Administered 2014-10-19 (×3): via INTRAVENOUS

## 2014-10-18 MED ORDER — GLUCOSE 4 GRAM CHEWABLE TAB
4 gram | ORAL | Status: DC | PRN
Start: 2014-10-18 — End: 2014-10-19

## 2014-10-18 MED ORDER — FENTANYL CITRATE (PF) 50 MCG/ML IJ SOLN
50 mcg/mL | INTRAMUSCULAR | Status: DC | PRN
Start: 2014-10-18 — End: 2014-10-18
  Administered 2014-10-18 (×2): via INTRAVENOUS

## 2014-10-18 MED ORDER — CEFAZOLIN 2 GM/50 ML IN DEXTROSE (ISO-OSMOTIC) IVPB
2 gram/50 mL | Freq: Once | INTRAVENOUS | Status: DC
Start: 2014-10-18 — End: 2014-10-18

## 2014-10-18 MED ORDER — GLUCOSE 4 GRAM CHEWABLE TAB
4 gram | ORAL | Status: DC | PRN
Start: 2014-10-18 — End: 2014-10-18

## 2014-10-18 MED ORDER — LACTATED RINGERS IV
INTRAVENOUS | Status: DC
Start: 2014-10-18 — End: 2014-10-18
  Administered 2014-10-18: 20:00:00 via INTRAVENOUS

## 2014-10-18 MED ORDER — LIDOCAINE (PF) 20 MG/ML (2 %) IJ SOLN
20 mg/mL (2 %) | INTRAMUSCULAR | Status: DC | PRN
Start: 2014-10-18 — End: 2014-10-18
  Administered 2014-10-18: 20:00:00 via INTRAVENOUS

## 2014-10-18 MED ORDER — GLYCOPYRROLATE 0.2 MG/ML IJ SOLN
0.2 mg/mL | INTRAMUSCULAR | Status: DC | PRN
Start: 2014-10-18 — End: 2014-10-18
  Administered 2014-10-18: 20:00:00 via INTRAVENOUS

## 2014-10-18 MED ORDER — SODIUM CHLORIDE 0.9 % IJ SYRG
INTRAMUSCULAR | Status: DC | PRN
Start: 2014-10-18 — End: 2014-10-19

## 2014-10-18 MED ORDER — ONDANSETRON (PF) 4 MG/2 ML INJECTION
4 mg/2 mL | INTRAMUSCULAR | Status: DC | PRN
Start: 2014-10-18 — End: 2014-10-18
  Administered 2014-10-18: 20:00:00 via INTRAVENOUS

## 2014-10-18 MED ORDER — INSULIN LISPRO 100 UNIT/ML INJECTION
100 unit/mL | Freq: Three times a day (TID) | SUBCUTANEOUS | Status: DC
Start: 2014-10-18 — End: 2014-10-19
  Administered 2014-10-19 (×2): via SUBCUTANEOUS

## 2014-10-18 MED ORDER — OXYCODONE-ACETAMINOPHEN 5 MG-325 MG TAB
5-325 mg | ORAL | Status: DC | PRN
Start: 2014-10-18 — End: 2014-10-19
  Administered 2014-10-18 – 2014-10-19 (×4): via ORAL

## 2014-10-18 MED ORDER — DOCUSATE SODIUM 100 MG CAP
100 mg | Freq: Two times a day (BID) | ORAL | Status: DC
Start: 2014-10-18 — End: 2014-10-19
  Administered 2014-10-19 (×2): via ORAL

## 2014-10-18 MED ORDER — HYDROMORPHONE 2 MG/ML INJECTION SOLUTION
2 mg/mL | INTRAMUSCULAR | Status: DC | PRN
Start: 2014-10-18 — End: 2014-10-18
  Administered 2014-10-18 (×2): via INTRAVENOUS

## 2014-10-18 MED ORDER — BUPIVACAINE-EPINEPHRINE (PF) 0.25 %-1:200,000 IJ SOLN
0.25 %-1:200,000 | INTRAMUSCULAR | Status: AC
Start: 2014-10-18 — End: ?

## 2014-10-18 MED ORDER — SODIUM CHLORIDE 0.9 % IV PIGGY BACK
3.375 gram | Freq: Once | INTRAVENOUS | Status: AC
Start: 2014-10-18 — End: 2014-10-18
  Administered 2014-10-18: 18:00:00 via INTRAVENOUS

## 2014-10-18 MED ORDER — ACETAMINOPHEN 1,000 MG/100 ML (10 MG/ML) IV
1000 mg/100 mL (10 mg/mL) | Freq: Once | INTRAVENOUS | Status: DC | PRN
Start: 2014-10-18 — End: 2014-10-18

## 2014-10-18 MED ORDER — KETOROLAC TROMETHAMINE 30 MG/ML INJECTION
30 mg/mL (1 mL) | Freq: Four times a day (QID) | INTRAMUSCULAR | Status: DC | PRN
Start: 2014-10-18 — End: 2014-10-19
  Administered 2014-10-18: 22:00:00 via INTRAVENOUS

## 2014-10-18 MED ORDER — GLYCOPYRROLATE 0.2 MG/ML IJ SOLN
0.2 mg/mL | INTRAMUSCULAR | Status: AC
Start: 2014-10-18 — End: ?

## 2014-10-18 MED ORDER — MIDAZOLAM 1 MG/ML IJ SOLN
1 mg/mL | INTRAMUSCULAR | Status: DC | PRN
Start: 2014-10-18 — End: 2014-10-18
  Administered 2014-10-18: 20:00:00 via INTRAVENOUS

## 2014-10-18 MED ORDER — INSULIN LISPRO 100 UNIT/ML INJECTION
100 unit/mL | Freq: Once | SUBCUTANEOUS | Status: AC
Start: 2014-10-18 — End: 2014-10-18
  Administered 2014-10-18: 20:00:00 via SUBCUTANEOUS

## 2014-10-18 MED ORDER — METOPROLOL TARTRATE 5 MG/5 ML IV SOLN
5 mg/ mL | Freq: Once | INTRAVENOUS | Status: DC
Start: 2014-10-18 — End: 2014-10-18

## 2014-10-18 MED ORDER — SODIUM CHLORIDE 0.9 % IV
INTRAVENOUS | Status: DC
Start: 2014-10-18 — End: 2014-10-18

## 2014-10-18 MED ORDER — CEFAZOLIN 2 GM/50 ML IN DEXTROSE (ISO-OSMOTIC) IVPB
2 gram/50 mL | INTRAVENOUS | Status: DC
Start: 2014-10-18 — End: 2014-10-18

## 2014-10-18 MED ORDER — DIPHENHYDRAMINE 25 MG CAP
25 mg | ORAL | Status: DC | PRN
Start: 2014-10-18 — End: 2014-10-19

## 2014-10-18 MED ORDER — POTASSIUM CHLORIDE 2 MEQ/ML IV SOLN
2 mEq/mL | INTRAVENOUS | Status: DC
Start: 2014-10-18 — End: 2014-10-19
  Administered 2014-10-18 – 2014-10-19 (×2): via INTRAVENOUS

## 2014-10-18 MED ORDER — MIDAZOLAM 1 MG/ML IJ SOLN
1 mg/mL | INTRAMUSCULAR | Status: AC
Start: 2014-10-18 — End: ?

## 2014-10-18 MED ORDER — OXYCODONE-ACETAMINOPHEN 5 MG-325 MG TAB
5-325 mg | ORAL | Status: DC
Start: 2014-10-18 — End: 2014-10-18

## 2014-10-18 MED ORDER — HYDROMORPHONE (PF) 1 MG/ML IJ SOLN
1 mg/mL | INTRAMUSCULAR | Status: DC | PRN
Start: 2014-10-18 — End: 2014-10-19

## 2014-10-18 MED ORDER — ONDANSETRON (PF) 4 MG/2 ML INJECTION
4 mg/2 mL | INTRAMUSCULAR | Status: DC | PRN
Start: 2014-10-18 — End: 2014-10-19

## 2014-10-18 MED ORDER — PROPOFOL 10 MG/ML IV EMUL
10 mg/mL | INTRAVENOUS | Status: DC | PRN
Start: 2014-10-18 — End: 2014-10-18
  Administered 2014-10-18: 20:00:00 via INTRAVENOUS

## 2014-10-18 MED ORDER — ATENOLOL 25 MG TAB
25 mg | Freq: Two times a day (BID) | ORAL | Status: DC
Start: 2014-10-18 — End: 2014-10-19
  Administered 2014-10-19 (×2): via ORAL

## 2014-10-18 MED ORDER — GLUCAGON 1 MG INJECTION
1 mg | INTRAMUSCULAR | Status: DC | PRN
Start: 2014-10-18 — End: 2014-10-19

## 2014-10-18 MED ORDER — FENTANYL CITRATE (PF) 50 MCG/ML IJ SOLN
50 mcg/mL | INTRAMUSCULAR | Status: DC | PRN
Start: 2014-10-18 — End: 2014-10-18

## 2014-10-18 MED ORDER — SODIUM CHLORIDE 0.9 % IV
Freq: Once | INTRAVENOUS | Status: DC
Start: 2014-10-18 — End: 2014-10-18

## 2014-10-18 MED ORDER — CLOPIDOGREL 75 MG TAB
75 mg | Freq: Every day | ORAL | Status: DC
Start: 2014-10-18 — End: 2014-10-18

## 2014-10-18 MED ORDER — SODIUM CHLORIDE 0.9 % IJ SYRG
INTRAMUSCULAR | Status: DC | PRN
Start: 2014-10-18 — End: 2014-10-18

## 2014-10-18 MED ORDER — ASPIRIN 325 MG TAB
325 mg | Freq: Every day | ORAL | Status: DC
Start: 2014-10-18 — End: 2014-10-19
  Administered 2014-10-19: 13:00:00 via ORAL

## 2014-10-18 MED ORDER — DEXTROSE 50% IN WATER (D50W) IV SYRG
INTRAVENOUS | Status: DC | PRN
Start: 2014-10-18 — End: 2014-10-19

## 2014-10-18 MED ORDER — FENTANYL CITRATE (PF) 50 MCG/ML IJ SOLN
50 mcg/mL | INTRAMUSCULAR | Status: AC
Start: 2014-10-18 — End: ?

## 2014-10-18 MED ORDER — METOPROLOL TARTRATE 5 MG/5 ML IV SOLN
5 mg/ mL | INTRAVENOUS | Status: DC | PRN
Start: 2014-10-18 — End: 2014-10-18
  Administered 2014-10-18: 20:00:00 via INTRAVENOUS

## 2014-10-18 MED ORDER — MORPHINE 2 MG/ML INJECTION
2 mg/mL | Freq: Once | INTRAMUSCULAR | Status: AC
Start: 2014-10-18 — End: 2014-10-18
  Administered 2014-10-18: 18:00:00 via INTRAVENOUS

## 2014-10-18 MED ORDER — PRAVASTATIN 20 MG TAB
20 mg | Freq: Every evening | ORAL | Status: DC
Start: 2014-10-18 — End: 2014-10-19
  Administered 2014-10-19: 03:00:00 via ORAL

## 2014-10-18 MED ORDER — INSULIN LISPRO 100 UNIT/ML INJECTION
100 unit/mL | SUBCUTANEOUS | Status: DC
Start: 2014-10-18 — End: 2014-10-18

## 2014-10-18 MED ORDER — BUPIVACAINE-EPINEPHRINE (PF) 0.25 %-1:200,000 IJ SOLN
0.25 %-1:200,000 | INTRAMUSCULAR | Status: DC | PRN
Start: 2014-10-18 — End: 2014-10-18
  Administered 2014-10-18: 20:00:00 via SUBCUTANEOUS

## 2014-10-18 MED ORDER — GLUCAGON 1 MG INJECTION
1 mg | INTRAMUSCULAR | Status: DC | PRN
Start: 2014-10-18 — End: 2014-10-18

## 2014-10-18 MED ORDER — INSULIN LISPRO 100 UNIT/ML INJECTION
100 unit/mL | Freq: Four times a day (QID) | SUBCUTANEOUS | Status: DC
Start: 2014-10-18 — End: 2014-10-19
  Administered 2014-10-19: 13:00:00 via SUBCUTANEOUS

## 2014-10-18 MED ORDER — INSULIN GLARGINE 100 UNIT/ML INJECTION
100 unit/mL | Freq: Every evening | SUBCUTANEOUS | Status: DC
Start: 2014-10-18 — End: 2014-10-19
  Administered 2014-10-19: 03:00:00 via SUBCUTANEOUS

## 2014-10-18 MED ORDER — SODIUM CHLORIDE 0.9 % IJ SYRG
Freq: Three times a day (TID) | INTRAMUSCULAR | Status: DC
Start: 2014-10-18 — End: 2014-10-19
  Administered 2014-10-19: 03:00:00 via INTRAVENOUS

## 2014-10-18 MED FILL — BD POSIFLUSH NORMAL SALINE 0.9 % INJECTION SYRINGE: INTRAMUSCULAR | Qty: 10

## 2014-10-18 MED FILL — MIDAZOLAM 1 MG/ML IJ SOLN: 1 mg/mL | INTRAMUSCULAR | Qty: 2

## 2014-10-18 MED FILL — KETOROLAC TROMETHAMINE 30 MG/ML INJECTION: 30 mg/mL (1 mL) | INTRAMUSCULAR | Qty: 1

## 2014-10-18 MED FILL — OXYCODONE-ACETAMINOPHEN 5 MG-325 MG TAB: 5-325 mg | ORAL | Qty: 2

## 2014-10-18 MED FILL — SODIUM CHLORIDE 0.9 % IV: INTRAVENOUS | Qty: 1000

## 2014-10-18 MED FILL — SODIUM CHLORIDE 0.9 % IV PIGGY BACK: INTRAVENOUS | Qty: 100

## 2014-10-18 MED FILL — FENTANYL CITRATE (PF) 50 MCG/ML IJ SOLN: 50 mcg/mL | INTRAMUSCULAR | Qty: 5

## 2014-10-18 MED FILL — MORPHINE 2 MG/ML INJECTION: 2 mg/mL | INTRAMUSCULAR | Qty: 1

## 2014-10-18 MED FILL — LACTATED RINGERS IV: INTRAVENOUS | Qty: 1000

## 2014-10-18 MED FILL — HUMALOG U-100 INSULIN 100 UNIT/ML SUBCUTANEOUS SOLUTION: 100 unit/mL | SUBCUTANEOUS | Qty: 3

## 2014-10-18 MED FILL — CEFAZOLIN 2 GM/50 ML IN DEXTROSE (ISO-OSMOTIC) IVPB: 2 gram/50 mL | INTRAVENOUS | Qty: 50

## 2014-10-18 MED FILL — SODIUM CHLORIDE 0.45 % IV: 0.45 % | INTRAVENOUS | Qty: 1000

## 2014-10-18 MED FILL — HYDROMORPHONE 2 MG/ML INJECTION SOLUTION: 2 mg/mL | INTRAMUSCULAR | Qty: 1

## 2014-10-18 MED FILL — MARCAINE-EPINEPHRINE (PF) 0.25 %-1:200,000 INJECTION SOLUTION: 0.25 %-1:200,000 | INTRAMUSCULAR | Qty: 30

## 2014-10-18 MED FILL — GLYCOPYRROLATE 0.2 MG/ML IJ SOLN: 0.2 mg/mL | INTRAMUSCULAR | Qty: 2

## 2014-10-18 NOTE — Progress Notes (Signed)
Admission Medication Reconciliation has been performed on this ED patient consisting of interview of the patient regarding their PTA Home Medication List, Allergies and PMH as well as obtaining outpatient pharmacy information.    Interviewed patient who was a good historian.  Patient ABX use within the past 30 days = Cipro 500mg  BID x 7d and flagyl 500mg  BID x7d both filled 10/13/14    Spoke with [facility] pharmacy to confirm atenolol dose and was informed that he also gets morphine sulfate 30mg  BID and percocet 7.5/325mg  #60 of both on a monthly basis, last filled 10/06/2014.  Also pt takes pravastatin and not simvastatin as reporte don PTA Med list.  EMR updated accordingly.    Pt denies GERD and prostate issues, will remove from PMH list.      Roosvelt MaserPamela VanCott RPh  Clinical Pharmacist  [phone]

## 2014-10-18 NOTE — Brief Op Note (Signed)
BRIEF OPERATIVE NOTE    Date of Procedure: 10/18/2014   Preoperative Diagnosis: peri rectal abscess  Postoperative Diagnosis: peri rectal abscess    Procedure(s):  DRAINAGE OF PERI RECTAL ABSCESS  Surgeon(s) and Role:     * Frankey PootSteven Elky Funches, MD - Primary  Anesthesia: General   Estimated Blood Loss: min  Specimens:   ID Type Source Tests Collected by Time Destination   1 : peri rectal abcess Wound Rectum CULTURE, ANAEROBIC, CULTURE, WOUND W GRAM STAIN Frankey PootSteven Coree Riester, MD 10/18/2014 1421 Microbiology      Findings: abcsess   Complications: none  Implants: * No implants in log *

## 2014-10-18 NOTE — Op Note (Signed)
[facility]                              [address]                          [place] [address]                               OPERATIVE REPORT    PATIENT:       Tracy [name], Tracy [name]  MRN:              [id number]  DATE:             10/18/2014  BILLING:          [id number] LOCATION:         PACUPACUPL  DICTATING:     Jenalyn Girdner, MD        PREOPERATIVE DIAGNOSIS: Perirectal abscess.    POSTOPERATIVE DIAGNOSES: Perirectal abscess.    PROCEDURES PERFORMED: Incision and drainage of perirectal abscess.    SURGEON: Travonne Schowalter, MD.    ANESTHESIA: General endotracheal, Dr. Baines.    ESTIMATED BLOOD LOSS: Minimal.    SPECIMENS: Cultures.    INDICATIONS: As above.    DESCRIPTION OF PROCEDURE: He was placed in lithotomy position. The  patient's left buttocks was indurated and an abscess was in the left  perirectal region. Incision and drainage was done with a #11 blade and the  abscess cavity was probed and loculations were broken up. A large amount of  pus was drained. The cavity was cultured. The cavity was irrigated with  normal saline and packed with gauze. The patient tolerated the procedure  well.                     Dehlia Kilner, MD      SH:wmx  D: 10/18/2014 03:14 P   T: 10/18/2014 04:19 P  BY:  [id number]  Job#:  [id number]  CScriptDoc #:  [id number]    cc:   Jaxiel Kines, MD        Josphat Musapatike

## 2014-10-18 NOTE — Anesthesia Post-Procedure Evaluation (Signed)
Post-Anesthesia Evaluation and Assessment    Cardiovascular Function/Vital Signs  Visit Vitals   Item Reading   ??? BP 145/74 mmHg   ??? Pulse 104   ??? Temp 37.6 ??C (99.6 ??F)   ??? Resp 17   ??? Ht 5' 11"  (1.803 m)   ??? Wt 83.915 kg (185 lb)   ??? BMI 25.81 kg/m2   ??? SpO2 95%       Patient is status post Procedure(s):  DRAINAGE OF PERI RECTAL ABSCESS.    Nausea/Vomiting: Controlled.    Postoperative hydration reviewed and adequate.    Pain:  Pain Scale 1: FLACC (Cortni RN verified FLACC) (10/18/14 1554)  Pain Intensity 1: 0 (10/18/14 1554)   Managed.    Neurological Status:   Neuro (WDL): Within Defined Limits (10/18/14 1541)   At baseline.    Mental Status and Level of Consciousness: Arousable.    Pulmonary Status:   O2 Device: Room air (10/18/14 1554)   Adequate oxygenation and airway patent.    Complications related to anesthesia: None    Post-anesthesia assessment completed. No concerns.    Patient has met all discharge requirements.    Signed By: Rella Larve, CRNA    October 18, 2014

## 2014-10-18 NOTE — Anesthesia Pre-Procedure Evaluation (Addendum)
Anesthetic History   No history of anesthetic complications     Pertinent negatives: No PONV       Review of Systems / Medical History  Patient summary reviewed, nursing notes reviewed and pertinent labs reviewed    Pulmonary              Pertinent negatives: No COPD and smoker (quit 2009)     Neuro/Psych   Within defined limits           Cardiovascular    Hypertension: well controlled          Past MI (4696.2952(2009.2010 Brief hospital stay. ) and CAD         GI/Hepatic/Renal  Within defined limits           Pertinent negatives: No GERD (denies. Was medication related. ), liver disease and renal disease   Endo/Other    Diabetes: poorly controlled    Arthritis     Other Findings              Physical Exam    Airway  Mallampati: IV  TM Distance: > 6 cm  Neck ROM: normal range of motion   Mouth opening: Normal     Cardiovascular  Regular rate and rhythm,  S1 and S2 normal,  no murmur, click, rub, or gallop             Dental  No notable dental hx       Pulmonary                 Abdominal         Other Findings            Anesthetic Plan    ASA: 3  Anesthesia type: general            Anesthetic plan and risks discussed with: Patient

## 2014-10-18 NOTE — Other (Addendum)
TRANSFER - IN REPORT:    Verbal report received from [facility], RN(name) on Tracy [name]  being received from PACU(unit) for routine progression of care      Report consisted of patient???s Situation, Background, Assessment and   Recommendations(SBAR).     Information from the following report(s) SBAR, Kardex, OR Summary and MAR was reviewed with the receiving nurse.    Opportunity for questions and clarification was provided.      Assessment completed upon patient???s arrival to unit and care assumed.

## 2014-10-18 NOTE — ED Provider Notes (Signed)
HPI Comments:   12:25 PM   Tracy [name] is a 58 y.o. male presents to the ED C/O worsening perirectal abscess. Pt was seen in ED on 10/13/14 and scheduled to follow up with Dr. Laurance FlattenHopson (surgeon) today, but was unable to find a ride there. Pt's relative says that she spoke to his receptionist who [facility] and that Dr. Laurance FlattenHopson would come see the pt here. Pt has been taking antibiotics since last ED visit as instructed. Pt denies fever and any other Sx or complaints.      Written by Buelah Manisaniel Hoock, ED Scribe, as dictated by Osborne OmanSarah Valarie Farace, PA-C     Patient is a 58 y.o. male presenting with abscess. The history is provided by the patient and a relative. No language interpreter was used.   Abscess   This is a recurrent problem. The problem has been gradually worsening. There has been no fever.        Past Medical History:   Diagnosis Date   ??? CAD (coronary artery disease)    ??? Diabetes (HCC)    ??? MI (myocardial infarction) (HCC)    ??? Hypertension    ??? High cholesterol    ??? Ill-defined condition      prostate problems   ??? Chronic pain    ??? Arthritis    ??? GERD (gastroesophageal reflux disease)        Past Surgical History:   Procedure Laterality Date   ??? Pr cardiac surg procedure unlist     ??? Hx coronary stent placement           Family History:   Problem Relation Age of Onset   ??? Diabetes Mother    ??? Heart Disease Mother    ??? Diabetes Father        History     Social History   ??? Marital Status: SINGLE     Spouse Name: N/A     Number of Children: N/A   ??? Years of Education: N/A     Occupational History   ??? Not on file.     Social History Main Topics   ??? Smoking status: Former Smoker   ??? Smokeless tobacco: Not on file   ??? Alcohol Use: Yes   ??? Drug Use: Not on file   ??? Sexual Activity: Not on file     Other Topics Concern   ??? Not on file     Social History Narrative                ALLERGIES: Review of patient's allergies indicates no known allergies.      Review of Systems    Constitutional: Negative for fever and fatigue.   HENT: Negative for rhinorrhea and sore throat.    Respiratory: Negative for cough and shortness of breath.    Cardiovascular: Negative for chest pain and palpitations.   Gastrointestinal: Negative for nausea, vomiting, abdominal pain and diarrhea.   Genitourinary: Negative for dysuria and difficulty urinating.   Musculoskeletal: Positive for myalgias (perirectal area). Negative for arthralgias.   Skin: Negative for color change and rash.   Neurological: Negative for light-headedness and headaches.       Filed Vitals:    10/18/14 1036   BP: 134/73   Pulse: 123   Temp: 98.4 ??F (36.9 ??C)   Resp: 16   Height: 5\' 11"  (1.803 m)   Weight: 83.915 kg (185 lb)   SpO2: 98%  Physical Exam   Constitutional: He appears well-developed and well-nourished. He appears distressed.   Cardiovascular: Normal rate and regular rhythm.    Pulmonary/Chest: Effort normal. He has no decreased breath sounds.   Abdominal: Soft. There is no tenderness.   Genitourinary: Rectal exam shows tenderness.         Skin:        Nursing note and vitals reviewed.     RESULTS    No orders to display       Labs Reviewed   CBC WITH AUTOMATED DIFF   PROTHROMBIN TIME + INR   METABOLIC PANEL, COMPREHENSIVE       No results found for this or any previous visit (from the past 12 hour(s)).     MDM  Number of Diagnoses or Management Options  Perirectal abscess:      Amount and/or Complexity of Data Reviewed  Clinical lab tests: ordered and reviewed  Discuss the patient with other providers: yes (Josphat Musapatike, MD (Emergency), Dr. Laurance FlattenHopson (Surgery))      MEDICATIONS GIVEN    Medications   morphine injection 2 mg (not administered)   piperacillin-tazobactam (ZOSYN) 3.375 g in 0.9% sodium chloride (MBP/ADV) 100 mL ADV (not administered)        Procedures  PROGRESS NOTE:  12:25 PM   Initial assessment performed.  Written by Buelah Manisaniel Hoock, ED Scribe, as dictated by Osborne OmanSarah Angelyse Heslin, PA-C    CONSULT NOTE:    12:46 PM  Osborne OmanSarah Blakeley Margraf, PA-C spoke with Tracy OctaveJosphat Musapatike, MD   Specialty: Emergency  Discussed pt's hx, disposition, and available diagnostic and imaging results. Reviewed care plans. After examining pt consulting recommends labs IV and pain med. No need to CT him as it is pretty obvious to him that pt needs to go to the operating room to have it opened.   Written by Tracy Octaveaniel S Hoock, ED Scribe, as dictated by Osborne OmanSarah Loyce Klasen, PA-C.     CONSULT NOTE:   1:02 PM  Josphat Musapatike, MD spoke with Dr. Laurance FlattenHopson. Plans for OR and initiate Zosyn.  Specialty: Surgery  Discussed pt's hx, disposition, and available diagnostic and imaging results. Reviewed care plans. Consulting physician agrees with plans as outlined to admit pt to his services.   Written by Buelah Manisaniel Hoock, ED Scribe, as dictated by Tracy OctaveJosphat Musapatike, MD     ADMISSION NOTE:  1:02 PM  Patient is being admitted to surgery by Dr. Laurance FlattenHopson. The results of their tests and reasons for their admission have been discussed with them and/or available family. They convey agreement and understanding for the need to be admitted and for their admission diagnosis.    Written by Buelah Manisaniel Hoock, ED Scribe, as dictated by Tracy OctaveJosphat Musapatike, MD.     CLINICAL IMPRESSION    1. Perirectal abscess           Written by Buelah Manisaniel Hoock, ED Scribe, as dictated by Osborne OmanSarah Jaedin Trumbo, PA-C   I agree with the above documentation as written by the scribe.  Osborne OmanSarah Jaxson Anglin, PA-C

## 2014-10-18 NOTE — Other (Signed)
Patient has not taken beta blocker in a few days. Dr. Isabella StallingBaines aware will dose in pacu.

## 2014-10-18 NOTE — Other (Signed)
Assisted patient from stretcher to bed. Bedside [facility] to [facility] T oral 99.6 BP 166/88, HR 102,   R 18, 100% O2 saturation on room air. Patient awake and alert. Dressing verified with receiving nurse/breakthrough drainage noted. No further questions from receiving nurse

## 2014-10-18 NOTE — Other (Signed)
Shift summary: pt arrived to unit via stretcher, transferred to bed independently. Pt able to turn and reposition, slowly with minimal discomfort. Pt rec'd PRN percocet and toradol for perirectal discomfort. Medications effective. Pt appetite satisfactory at meals. Pt family at bedside part of shift. Pt resting quietly in bed watching t.v.

## 2014-10-18 NOTE — Other (Signed)
Family updated in downstairs waiting room, given room 301

## 2014-10-18 NOTE — Consults (Signed)
Medicine Consult    Patient:  Tracy [name] 58 y.o. male 05/15/1956  Asked to evaluate patient by Dr Laurance FlattenHopson  Primary Care Provider:  Sol BlazingPhys Other, MD  Date of Admission:  10/18/2014  Reason for Consult: diabetes        Assessment/Plan   1. Perirectal abscess post I&D  2. DM2, insulin dependent  3. CAD  4. HTN  5. Hyponatremia due to dehydration      PLAN:  - restart home dose lantus 75 units qhs, novolog 30 units tid w meals plus correction if required  - continue home dose atenolol  - stop plavix and change to asa 325 as this has changed as outpt  - agree w IV fluids      Patient Active Problem List   Diagnosis Code   ??? Diabetes  CAD    ??? Chest pain R07.9   ??? Perirectal abscess K61.1   ??? Surgery, other elective Z41.9       Thank you for allowing us to participate in  Tracy [name] care  HPI:   CC: rectal abscess  Tracy [name] is a 58 y.o. 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 He was seen in ER on the 23rd and discharged home with out patient follow up which he was unable to arrange and thus presented back to the ER today and subsequently underwent I&D. Medicine is asked to manage his medical comorbidities which he states are controlled expcet his a1c was noted to be 11 this summer. He denies cp, dyspnea, palpitations. He recently saw cardiology and his plavix/ asa was changed to once daily aspirin.     Past Medical History   Diagnosis Date   ??? CAD (coronary artery disease)    ??? Diabetes (HCC)    ??? MI (myocardial infarction) (HCC)    ??? Hypertension    ??? High cholesterol    ??? Chronic pain    ??? Arthritis      Past Surgical History   Procedure Laterality Date   ??? Pr cardiac surg procedure unlist     ??? Hx coronary stent placement        History   Substance Use Topics   ??? Smoking status: Former Smoker   ??? Smokeless tobacco: Not on file   ??? Alcohol Use: Yes     Family History   Problem Relation Age of Onset    ??? Diabetes Mother    ??? Heart Disease Mother    ??? Diabetes Father      No current facility-administered medications on file prior to encounter.     Current Outpatient Prescriptions on File Prior to Encounter   Medication Sig Dispense Refill   ??? insulin aspart (NOVOLOG) 100 unit/mL injection 30 Units by SubCUTAneous route Before breakfast, lunch, and dinner.     ??? ciprofloxacin HCl (CIPRO) 500 mg tablet Take 1 Tab by mouth two (2) times a day for 7 days. (Patient taking differently: Take 500 mg by mouth two (2) times a day. x7d on 10/13/14) 14 Tab 0   ??? metroNIDAZOLE (FLAGYL) 500 mg tablet Take 1 Tab by mouth two (2) times a day for 7 days. (Patient taking differently: Take 500 mg by mouth two (2) times a day. x7d on 10/13/14) 14 Tab 0   ??? docusate sodium (COLACE) 100 mg capsule Take 1 Cap by mouth two (2) times a  day. 20 Cap 2   ??? aspirin 81 mg chewable tablet Take 81 mg by mouth daily.     ??? clopidogrel (PLAVIX) 75 mg tablet Take 75 mg by mouth daily.        No Known Allergies      Review of Systems  Constitutional: No fever, +chills-, diaphoresis, -malaise, -fatigue or weight gain/loss or falls  Skin: no itching or rashes  HEENT: no ear discomfort, hearing loss, tinnitus, epistaxis or sore throat  EYES: no blurry vision, double vision or photophobia  CARDIOVASCULAR: no claudication, cp, palpitations, orthopnea, pnd or LE edema  PULMONARY: no cough, wheeze, shortness of breath or sputum production  GI: no nausea, vomiting, diarrhea, abdominal pain, melena, hematemesis or brbpr  GU: no dysuria, hematuria  MUSCULOSKELETAL: no back pain, joint pain or myalgias  ENDOCRINE: no heat/cold intolerance, polyuria or polydipsia  HEME: no easy bruising or bleeding  NEURO: no unilateral weakness, numbness, tingling or seizures      Physical Exam:      Visit Vitals   Item Reading   ??? BP 166/88 mmHg   ??? Pulse 102   ??? Temp 99.6 ??F (37.6 ??C)   ??? Resp 17   ??? Ht 5\' 11"  (1.803 m)   ??? Wt 83.915 kg (185 lb)   ??? BMI 25.81 kg/m2    ??? SpO2 100%     Body mass index is 25.81 kg/(m^2).    Physical Exam:  GEN: well nourished, laying in bed in no acute distress  HEENT: atraumatic, nose normal,oropharynx clear, MMM  NECK: supple, trachea midline, no supraclavicular or submandibular adenopathy noted  EYES: conjuctiva normal, lids with out lesions, PERRL  HEART: RRR with out m/r/g, pmi nondisplaced, pulses 2+ distally, cap refil normal  LUNGS: equal chest wall expansion, cta bl with out wheezes/rales or rhonchi  AB: soft, +BS, nt/nd no organomegaly, buttocks w bandage cdi  NEURO: alert, awake and oriented x3, gait not assessed, cranial nerves intact, strength 5/5 bl UE and LE, sensation intact, reflexes nonpathological  SKIN: dry, intact, warm no breakdown noted        Laboratory Studies:    BMP:   Lab Results   Component Value Date/Time    NA 130* 10/18/2014 01:00 PM    K 3.8 10/18/2014 01:00 PM    CL 90* 10/18/2014 01:00 PM    CO2 21 10/18/2014 01:00 PM    AGAP 19* 10/18/2014 01:00 PM    GLU 300* 10/18/2014 01:00 PM    BUN 19* 10/18/2014 01:00 PM    CREA 1.14 10/18/2014 01:00 PM    GFRAA >60 10/18/2014 01:00 PM    GFRNA >60 10/18/2014 01:00 PM     CBC:   Lab Results   Component Value Date/Time    WBC 20.4* 10/18/2014 01:00 PM    HGB 14.2 10/18/2014 01:00 PM    HCT 40.6 10/18/2014 01:00 PM    PLT 321 10/18/2014 01:00 PM     Recent Glucose Results:   Lab Results   Component Value Date/Time    GLU 300* 10/18/2014 01:00 PM           Esperanza RichtersJacob Zymire Turnbo, DO  Internal Medicine/Geriatrics

## 2014-10-18 NOTE — ED Notes (Addendum)
C/o perirectal abscess, pt seen here 10/13/14 for same, pt supposed to go to the surgeon (Dr. Laurance FlattenHopson) today but they didn't have a way there today, on had a way to the ED.   Sepsis Screening completed    (  )Patient meets SIRS criteria.  ( x )Patient does not meet SIRS criteria.      SIRS Criteria is achieved when two or more of the following are present  ? Temperature < 96.8??F (36??C) or > 100.9??F (38.3??C)  ? Heart Rate > 90 beats per minute  ? Respiratory Rate > 20 beats per minute  ? WBC count > 12,000 or <4,000 or > 10% bands      ( x )Patient has a suspected source of infection.  (  )Patient does not have a suspected source of infection.

## 2014-10-18 NOTE — Other (Signed)
Called 3B for nurse to look at sbar and call me back for report

## 2014-10-18 NOTE — H&P (Signed)
History & Physical    Patient: Tracy [name] MRN: [id number]  CSN: [id number]    Date of Birth: May 30, 1956  Age: 58 y.o.  Sex: male      DOA: 10/18/2014       HPI:     Tracy JohnsDarius Ringle is a 58 y.o. male who presents with a perirectal abscess.    Past Medical History   Diagnosis Date   ??? CAD (coronary artery disease)    ??? Diabetes (HCC)    ??? MI (myocardial infarction) (HCC)    ??? Hypertension    ??? High cholesterol    ??? Chronic pain    ??? Arthritis        Past Surgical History   Procedure Laterality Date   ??? Pr cardiac surg procedure unlist     ??? Hx coronary stent placement         Family History   Problem Relation Age of Onset   ??? Diabetes Mother    ??? Heart Disease Mother    ??? Diabetes Father        History     Social History   ??? Marital Status: SINGLE     Spouse Name: N/A     Number of Children: N/A   ??? Years of Education: N/A     Social History Main Topics   ??? Smoking status: Former Smoker   ??? Smokeless tobacco: Not on file   ??? Alcohol Use: Yes   ??? Drug Use: Not on file   ??? Sexual Activity: Not on file     Other Topics Concern   ??? Not on file     Social History Narrative       Prior to Admission medications    Medication Sig Start Date End Date Taking? Authorizing Provider   atenolol (TENORMIN) 25 mg tablet Take 25 mg by mouth two (2) times a day.   Yes Historical Provider   oxyCODONE-acetaminophen (PERCOCET 7.5) 7.5-325 mg per tablet Take 1 Tab by mouth two (2) times daily as needed for Pain.   Yes Historical Provider   morphine CR (MS CONTIN) 30 mg CR tablet Take 30 mg by mouth every twelve (12) hours as needed.   Yes Historical Provider   insulin glargine (LANTUS) 100 unit/mL injection 75 Units by SubCUTAneous route nightly.    Historical Provider   pravastatin (PRAVACHOL) 80 mg tablet Take 80 mg by mouth nightly.    Historical Provider   ciprofloxacin HCl (CIPRO) 500 mg tablet Take 1 Tab by mouth two (2) times a day for 7 days.  Patient taking differently: Take 500 mg by mouth two (2) times a day. x7d  on 10/13/14 10/13/14 10/20/14  Junie Bameobert C Dearnley, PA   metroNIDAZOLE (FLAGYL) 500 mg tablet Take 1 Tab by mouth two (2) times a day for 7 days.  Patient taking differently: Take 500 mg by mouth two (2) times a day. x7d on 10/13/14 10/13/14 10/20/14  Junie Bameobert C Dearnley, PA   docusate sodium (COLACE) 100 mg capsule Take 1 Cap by mouth two (2) times a day. 10/13/14   Junie Bameobert C Dearnley, PA   insulin aspart (NOVOLOG) 100 unit/mL injection 30 Units by SubCUTAneous route Before breakfast, lunch, and dinner.    Historical Provider   aspirin 81 mg chewable tablet Take 81 mg by mouth daily.    Phys Other, MD   clopidogrel (PLAVIX) 75 mg tablet Take 75 mg by mouth daily.    Phys Other, MD  No Known Allergies    Review of Systems  A comprehensive review of systems was negative except for that written in the History of Present Illness.      Physical Exam:      Visit Vitals   Item Reading   ??? BP 142/72 mmHg   ??? Pulse 110   ??? Temp 98.4 ??F (36.9 ??C)   ??? Resp 18   ??? Ht 5\' 11"  (1.803 m)   ??? Wt 83.915 kg (185 lb)   ??? BMI 25.81 kg/m2   ??? SpO2 100%       Physical Exam:  Physical Exam:   General:  Alert, cooperative, no distress, appears stated age.   Eyes:  Conjunctivae/corneas clear. PERRL, EOMs intact. Fundi benign   Ears:  Normal TMs and external ear canals both ears.   Nose: Nares normal. Septum midline. Mucosa normal. No drainage or sinus tenderness.   Mouth/Throat: Lips, mucosa, and tongue normal. Teeth and gums normal.   Neck: Supple, symmetrical, trachea midline, no adenopathy, thyroid: no enlargement/tenderness/nodules, no carotid bruit and no JVD.   Back:   Symmetric, no curvature. ROM normal. No CVA tenderness.   Lungs:   Clear to auscultation bilaterally.   Heart:  Regular rate and rhythm, S1, S2 normal, no murmur, click, rub or gallop.   Abdomen:   Soft, non-tender. Bowel sounds normal. No masses,  No organomegaly. Buttocks with induration.   Extremities: Extremities normal, atraumatic, no cyanosis or edema.    Pulses: 2+ and symmetric all extremities.   Skin: Skin color, texture, turgor normal. No rashes or lesions   Lymph nodes: Cervical, supraclavicular, and axillary nodes normal.   Neurologic: CNII-XII intact. Normal strength, sensation and reflexes throughout.       Labs Reviewed:    All lab results for the last 24 hours reviewed.    Assessment/Plan     Active Problems:    * No active hospital problems. *      I and D.

## 2014-10-18 NOTE — ED Notes (Signed)
Patient to OR via OR stretcher.

## 2014-10-18 NOTE — Other (Signed)
Bedside shift change report given to Loma SousaMeghan Pollard, RN (oncoming nurse) by Rudene ReKristie A Reeve, RN   (offgoing nurse). Report included the following information SBAR, Kardex, OR Summary and MAR.

## 2014-10-18 NOTE — Other (Signed)
TRANSFER - OUT REPORT:    Verbal report given to Boston ScientificKristie RN (name) on Tracy [name]  being transferred to 3B(unit) for routine post - op       Report consisted of patient???s Situation, Background, Assessment and   Recommendations(SBAR).     Information from the following report(s) SBAR, Intake/Output and MAR was reviewed with the receiving nurse.    Lines:   Peripheral IV 10/18/14 Left Forearm (Active)   Site Assessment Clean, dry, & intact 10/18/2014  3:43 PM   Phlebitis Assessment 0 10/18/2014  3:43 PM   Infiltration Assessment 0 10/18/2014  3:43 PM   Dressing Status Clean, dry, & intact 10/18/2014  3:43 PM   Dressing Type Transparent;Tape 10/18/2014  3:43 PM   Hub Color/Line Status Pink;Infusing 10/18/2014  3:43 PM   Action Taken Wrapped 10/18/2014  1:10 PM        Opportunity for questions and clarification was provided.      Patient transported with:   Registered Nurse  Tech

## 2014-10-18 NOTE — Op Note (Signed)
[facility]                              [address]                          [place] [phone]                               OPERATIVE REPORT    PATIENT:       Tracy JohnsDIGGS, Tracy [name]  MRN:              [id number]  DATE:             10/18/2014  BILLING:          [id number] LOCATION:         [id number]  DICTATING:     Frankey PootSteven Desi Rowe, MD        PREOPERATIVE DIAGNOSIS: Perirectal abscess.    POSTOPERATIVE DIAGNOSES: Perirectal abscess.    PROCEDURES PERFORMED: Incision and drainage of perirectal abscess.    SURGEON: Frankey PootSteven Ogechi Kuehnel, MD.    ANESTHESIA: General endotracheal, Dr. Isabella StallingBaines.    ESTIMATED BLOOD LOSS: Minimal.    SPECIMENS: Cultures.    INDICATIONS: As above.    DESCRIPTION OF PROCEDURE: He was placed in lithotomy position. The  patient's left buttocks was indurated and an abscess was in the left  perirectal region. Incision and drainage was done with a #11 blade and the  abscess cavity was probed and loculations were broken up. A large amount of  pus was drained. The cavity was cultured. The cavity was irrigated with  normal saline and packed with gauze. The patient tolerated the procedure  well.                     Frankey PootSteven Trissa Molina, MD      SH:wmx  D: 10/18/2014 03:14 P   T: 10/18/2014 04:19 P  BY:  [id number]  Job#:  [id number]  CScriptDoc #:  [id number]    cc:   Frankey PootSteven Dezi Brauner, MD        [place]

## 2014-10-19 LAB — METABOLIC PANEL, BASIC
Anion gap: 8 mmol/L (ref 3.0–18)
BUN/Creatinine ratio: 20 (ref 12–20)
BUN: 19 MG/DL — ABNORMAL HIGH (ref 7.0–18)
CO2: 28 mmol/L (ref 21–32)
Calcium: 8.1 MG/DL — ABNORMAL LOW (ref 8.5–10.1)
Chloride: 96 mmol/L — ABNORMAL LOW (ref 100–108)
Creatinine: 0.96 MG/DL (ref 0.6–1.3)
GFR est AA: >60 mL/min/{1.73_m2} (ref >60)
GFR est non-AA: >60 mL/min/{1.73_m2} (ref >60)
Glucose: 138 mg/dL — ABNORMAL HIGH (ref 74–99)
Potassium: 3.3 mmol/L — ABNORMAL LOW (ref 3.5–5.5)
Sodium: 132 mmol/L — ABNORMAL LOW (ref 136–145)

## 2014-10-19 LAB — CBC WITH AUTOMATED DIFF
ABS. BASOPHILS: 0 10*3/uL (ref 0.0–0.1)
ABS. EOSINOPHILS: 0.2 10*3/uL (ref 0.0–0.4)
ABS. LYMPHOCYTES: 3.1 10*3/uL (ref 0.8–3.5)
ABS. MONOCYTES: 1.4 10*3/uL — ABNORMAL HIGH (ref 0–1.0)
ABS. NEUTROPHILS: 14 10*3/uL — ABNORMAL HIGH (ref 1.8–8.0)
BAND NEUTROPHILS: 3 % (ref 0–5)
BASOPHILS: 0 % (ref 0–3)
EOSINOPHILS: 1 % (ref 0–5)
HCT: 36 % (ref 36.0–48.0)
HGB: 12.5 g/dL — ABNORMAL LOW (ref 13.0–16.0)
LYMPHOCYTES: 16 % — ABNORMAL LOW (ref 20–51)
MCH: 29.4 PG (ref 24.0–34.0)
MCHC: 34.7 g/dL (ref 31.0–37.0)
MCV: 84.7 FL (ref 74.0–97.0)
METAMYELOCYTES: 1 % — ABNORMAL HIGH
MONOCYTES: 7 % (ref 2–9)
MPV: 9.6 FL (ref 9.2–11.8)
NEUTROPHILS: 72 % (ref 42–75)
PLATELET: 306 10*3/uL (ref 135–420)
RBC: 4.25 M/uL — ABNORMAL LOW (ref 4.70–5.50)
RDW: 12.1 % (ref 11.6–14.5)
WBC COMMENTS: REACTIVE
WBC: 19.4 10*3/uL — ABNORMAL HIGH (ref 4.6–13.2)

## 2014-10-19 LAB — GLUCOSE, POC
Glucose (POC): 121 mg/dL — ABNORMAL HIGH (ref 70–110)
Glucose (POC): 152 mg/dL — ABNORMAL HIGH (ref 70–110)
Glucose (POC): 56 mg/dL — ABNORMAL LOW (ref 70–110)
Glucose (POC): 51 mg/dL — ABNORMAL LOW (ref 70–110)
Glucose (POC): 59 mg/dL — ABNORMAL LOW (ref 70–110)
Glucose (POC): 61 mg/dL — ABNORMAL LOW (ref 70–110)
Glucose (POC): 126 mg/dL — ABNORMAL HIGH (ref 70–110)
Glucose (POC): 93 mg/dL (ref 70–110)

## 2014-10-19 MED ORDER — CIPROFLOXACIN 250 MG TAB
250 mg | ORAL_TABLET | Freq: Two times a day (BID) | ORAL | Status: DC
Start: 2014-10-19 — End: 2014-10-29

## 2014-10-19 MED ORDER — OXYCODONE-ACETAMINOPHEN 5 MG-325 MG TAB
5-325 mg | ORAL_TABLET | ORAL | Status: AC | PRN
Start: 2014-10-19 — End: ?

## 2014-10-19 MED ORDER — METRONIDAZOLE 500 MG TAB
500 mg | ORAL_TABLET | Freq: Three times a day (TID) | ORAL | Status: DC
Start: 2014-10-19 — End: 2014-10-29

## 2014-10-19 MED ORDER — SODIUM CHLORIDE 0.9 % IV
INTRAVENOUS | Status: DC
Start: 2014-10-19 — End: 2014-10-19
  Administered 2014-10-19: 12:00:00 via INTRAVENOUS

## 2014-10-19 MED FILL — ASPIRIN 325 MG TAB: 325 mg | ORAL | Qty: 1

## 2014-10-19 MED FILL — SODIUM CHLORIDE 0.9 % IV: INTRAVENOUS | Qty: 1000

## 2014-10-19 MED FILL — SODIUM CHLORIDE 0.9 % IV PIGGY BACK: INTRAVENOUS | Qty: 100

## 2014-10-19 MED FILL — ATENOLOL 25 MG TAB: 25 mg | ORAL | Qty: 1

## 2014-10-19 MED FILL — OXYCODONE-ACETAMINOPHEN 5 MG-325 MG TAB: 5-325 mg | ORAL | Qty: 2

## 2014-10-19 MED FILL — PRAVASTATIN 20 MG TAB: 20 mg | ORAL | Qty: 4

## 2014-10-19 MED FILL — DOCUSATE SODIUM 100 MG CAP: 100 mg | ORAL | Qty: 1

## 2014-10-19 MED FILL — INSULIN GLARGINE 100 UNIT/ML INJECTION: 100 unit/mL | SUBCUTANEOUS | Qty: 1

## 2014-10-19 NOTE — Progress Notes (Signed)
Feels OK.  BS improved.  D/C  Wound care f/u with WC.

## 2014-10-19 NOTE — Other (Signed)
GLYCEMIC CONTROL AND NUTRITION    Assessment/Recommendations:  - hypoglycemic (51) event noted, spoke with RN on floor to follow-up & pt is now discharged, hypoglycemic protocol followed  - recommend decreased insulin dose if pt admitted in the future, on large home doses of Lantus and Novalog mealtime    Most recent blood glucose values:   Lab Results   Component Value Date/Time    GLU 138* 10/19/2014 07:57 AM    GLUCPOC 126* 10/19/2014 01:17 PM    GLUCPOC 93 10/19/2014 12:31 PM    GLUCPOC 61* 10/19/2014 12:09 PM       Current A1C is equivalent to average blood glucose of 297 mg/dl over the past 2-3 months.    Lab Results   Component Value Date/Time    HEMOGLOBIN A1C 11.9 10/18/2014 01:28 PM       Current hospital diabetes medications:   - Lantus 75 units qhs  - Humalog 30 units QAC  - Humalog Normal Insulin Sensitivity Corrective Coverage    Previous day's insulin requirements:   <24 hours    Home diabetes medications:  - Lantus 75 units qhs  - Novalog 30 units QAC    Anthropometrics:   Body mass index is 25.81 kg/(m^2).  Last 3 Recorded Weights in this Encounter    10/18/14 1036   Weight: 83.915 kg (185 lb)    Ht Readings from Last 1 Encounters:   10/18/14 5\' 11"  (1.803 m)       Diet:    Active Orders   Diet    DIET DIABETIC CONSISTENT CARB Regular       Intake:   Patient Vitals for the past 100 hrs:   % Diet Eaten   10/19/14 1242 30 %   10/19/14 0840 100 %   10/18/14 1915 100 %       Education:  ____Refer to Diabetes Education Record             __x__Education not indicated at this time      M. Remonia Richteratherine Brown, RD

## 2014-10-19 NOTE — Progress Notes (Signed)
Hospitalist Progress Note    Patient: Tracy [name] MRN: [id number]  CSN: [id number]    Date of Birth: [birth year]  Age: 58 y.o.  Sex: male    DOA: 10/18/2014 LOS:  LOS: 1 day            Assessment/Plan   1.  Perirectal abscess sp I&D  2. DM2, uncontrolled w a1c 11.9  3. HTN controlled  4. Hyponatremia  5. CAD    Plan:  - change fluids to NS given hyponatremia  - contineu lantus 75, novolog 30 units tid  - check bmp       Patient Active Problem List   Diagnosis Code   ??? Drug overdose T50.901A   ??? Chest pain R07.9   ??? Perirectal abscess K61.1   ??? Surgery, other elective Z41.9               Subjective:    cc: perirectal abscess  No acute events overnight  Pt co pain at surgical site      REVIEW OF SYSTEMS:  General: No fevers or chills.  Cardiovascular: No chest pain or pressure. No palpitations.   Pulmonary: No shortness of breath.   Gastrointestinal: No nausea, vomiting.     Objective:        Vital signs/Intake and Output:  Visit Vitals   Item Reading   ??? BP 96/51 mmHg   ??? Pulse 84   ??? Temp 99.1 ??F (37.3 ??C)   ??? Resp 16   ??? Ht 5\' 11"  (1.803 m)   ??? Wt 83.915 kg (185 lb)   ??? BMI 25.81 kg/m2   ??? SpO2 95%     Current Shift:     Last three shifts:  12/27 1900 - 12/29 0659  In: 3063.3 [P.O.:1280; I.V.:1783.3]  Out: 455 [Urine:450]    Body mass index is 25.81 kg/(m^2).    Physical Exam:  GEN: Alert and oriented times three NAD  EYES: conjunctiva normal, lids with out lesions  HEENT: MMM, No thyromegaly, no lymphadenopathy  HEART: RRR +S1 +S2, no JVD, pulses 2+ distally  LUNGS: CTA B/L no wheezes, rales or rhonchi  ABDOMEN: + BS, soft NT/ND no organomegaly,  No rebound  EXTREMITIES: No edema cyanosis, cap refill normal   SKIN: no rashes or skin breakdown, no nodules, normal turgor  Current Facility-Administered Medications   Medication Dose Route Frequency   ??? 0.9% sodium chloride infusion  100 mL/hr IntraVENous CONTINUOUS   ??? atenolol (TENORMIN) tablet 25 mg  25 mg Oral BID    ??? docusate sodium (COLACE) capsule 100 mg  100 mg Oral BID   ??? pravastatin (PRAVACHOL) tablet 80 mg  80 mg Oral QHS   ??? sodium chloride (NS) flush 5-10 mL  5-10 mL IntraVENous Q8H   ??? sodium chloride (NS) flush 5-10 mL  5-10 mL IntraVENous PRN   ??? ketorolac (TORADOL) injection 30 mg  30 mg IntraVENous Q6H PRN   ??? oxyCODONE-acetaminophen (PERCOCET) 5-325 mg per tablet 1-2 Tab  1-2 Tab Oral Q4H PRN   ??? HYDROmorphone (PF) (DILAUDID) injection 1 mg  1 mg IntraVENous Q4H PRN   ??? ondansetron (ZOFRAN) injection 4 mg  4 mg IntraVENous Q4H PRN   ??? diphenhydrAMINE (BENADRYL) capsule 25 mg  25 mg Oral Q4H PRN   ??? aspirin (ASPIRIN) tablet 325 mg  325 mg Oral DAILY   ??? insulin glargine (LANTUS) injection 75 Units  75 Units SubCUTAneous QHS   ??? insulin lispro (HUMALOG) injection 30 Units  30  Units SubCUTAneous TIDAC   ??? insulin lispro (HUMALOG) injection   SubCUTAneous AC&HS   ??? glucose chewable tablet 16 g  4 Tab Oral PRN   ??? glucagon (GLUCAGEN) injection 1 mg  1 mg IntraMUSCular PRN   ??? dextrose (D50W) injection syrg 12.5-25 g  25-50 mL IntraVENous PRN   ??? piperacillin-tazobactam (ZOSYN) 3.375 g in 0.9% sodium chloride (MBP/ADV) 100 mL ADV  3.375 g IntraVENous Q6H         All the patient's labs over the past 24 hours were reviewed both during my initial daily workflow process and at the time notated as "note time" in [facility].  (It is not time stamped separately in this workflow.)  Select labs are listed below.        Labs: Results:       Chemistry Recent Labs      10/18/14   1300   GLU  300*   NA  130*   K  3.8   CL  90*   CO2  21   BUN  19*   CREA  1.14   CA  8.5   AGAP  19*   BUCR  17   AP  44*   TP  7.8   ALB  2.9*   GLOB  4.9*   AGRAT  0.6*      CBC w/Diff Recent Labs      10/18/14   1300   WBC  20.4*   RBC  4.77   HGB  14.2   HCT  40.6   PLT  321   GRANS  82*   LYMPH  7*   EOS  0          Coagulation Recent Labs      10/18/14   1300   PTP  16.5*   INR  1.3*               Liver Enzymes Recent Labs      10/18/14    1300   TP  7.8   ALB  2.9*   AP  44*   SGOT  26          Procedures/imaging: see electronic medical records for all procedures/Xrays and details which were not copied into this note but were reviewed prior to creation of Plan            Esperanza RichtersJacob Keiyana Stehr, DO  Internal Medicine/Geriatrics

## 2014-10-19 NOTE — Wound Image (Signed)
Pt seen by wound care for dressing change to buttock wound.  Please see full assessment in Media.  Pt scheduled to have follow up appointment with wound clinic on 10/20/14 and 10/25/14.

## 2014-10-19 NOTE — Progress Notes (Signed)
Readmission Risk Assessment:     Moderate Risk and MSSP/Good Help ACO patients    RRAT Score:  13-20    Initial Assessment:Reviewed chart and noted pt under observation status. CM met with pt to provide observation letter, which pt signed, CM placed original on chart and provided pt with copy. Pt states that he has a ride home today with friend/discharge caregiver Andreas Newport. Pt states that his PCP is Dr. Vanna Scotland with [facility]. Pt has follow-up appt scheduled with Dr. Argie Ramming. CM discussed case with Jordan Likes, wound care clinic nurse, who stated pt has follow-up appt scheduled at [facility] for 10/20/14 at 1pm (see AVS). Pt has Medicare.    Oral and Written notification given to patient and/or caregiver informing them that they are currently an Outpatient receiving care in our facility.  Outpatient services include Observation Services.     Care Management Interventions  PCP Verified by CM?: Yes  Palliative Care Consult: No  Care Management Consult: No  MyChart Signup: No  Discharge Durable Medical Equipment: No  Physical Therapy Consult: No  Occupational Therapy Consult: No  Speech Therapy Consult: No  Confirm Follow Up Transport: Friends  Plan discussed with Pt/Family/Caregiver: Yes  Discharge Location  Discharge Placement: Home with family assistance     Emergency Contact:  Andreas Newport    Pertinent Medical Hx:     Perirectal abscess    PCP/Specialists: Dr. Vanna Scotland      Community Services:       DME:          Moderate Risk Care Transition Plan:  1. Evaluate for [facility] or H2H, SNF, acute rehab, community care coordination of resources.  2. Involve patient/caregiver in assessment, planning, education and implement of intervention.  3. CM daily patient care huddles/interdisciplinary rounds.  4. PCP/Specialist appointment within 5 ??? 7 days made prior to discharge.  5. Facilitate transportation and logistics for follow-up appointments.  6. Medication reconciliation ??? Pharmacy   7. Formal handoff between hospital provider and post-acute provider to transition patient  Handoff to [place] Nurse Navigator or PCP practice.

## 2014-10-19 NOTE — Progress Notes (Signed)
Chaplain conducted an initial consultation and Spiritual Assessment for Tracy [name], who is a 58 y.o.,male. Patient???s Primary Language is: [place].   According to the patient???s EMR Religious Affiliation is: No religion.     The reason the Patient came to the hospital is:   Patient Active Problem List    Diagnosis Date Noted   ??? Uncontrolled type II diabetes mellitus (HCC) 10/19/2014   ??? Perirectal abscess 10/18/2014   ??? Surgery, other elective 10/18/2014   ??? Chest pain 06/07/2014   ??? Drug overdose 08/28/2013      Patient is not available to be assessed at this time-asleep.    [name], [place], [employer]  Chaplain   Spiritual Care  [phone]

## 2014-10-19 NOTE — Progress Notes (Signed)
Patient had uneventful night. Pain controlled with prn Percocet. Dressing to perianal area changed X1, foul smelling drainage noted. Reinforced packing with sterile 4X4s and tape. Patient tolerated procedure well. VSS. No acute changes over night.     Patient Vitals for the past 12 hrs:   Temp Pulse Resp BP SpO2   10/19/14 0709 99.1 ??F (37.3 ??C) 84 16 96/51 mmHg 95 %   10/19/14 0409 98 ??F (36.7 ??C) 82 16 123/84 mmHg 96 %   10/18/14 2350 98 ??F (36.7 ??C) 95 16 132/56 mmHg 99 %     Bedside and Verbal shift change report given to J. Hylbert, RN (Cabin crewoncoming nurse) by Cyd SilenceMEGHAN L Raylene Carmickle, RN   (offgoing nurse). Report included the following information SBAR, Kardex and MAR.

## 2014-10-19 NOTE — Progress Notes (Signed)
Shift Summary:  Uneventful. Patient was cooperative. A/O x4. Respirations even and unlabored. Wound care did dressing change prior to discharge. Dressing CDI. No acute changes in condition.     Patient Vitals for the past 24 hrs:   Temp Pulse Resp BP SpO2   10/19/14 1437 98 ??F (36.7 ??C) 80 16 96/61 mmHg 100 %   10/19/14 1100 98.3 ??F (36.8 ??C) 100 16 104/51 mmHg 98 %   10/19/14 0709 99.1 ??F (37.3 ??C) 84 16 96/51 mmHg 95 %   10/19/14 0409 98 ??F (36.7 ??C) 82 16 123/84 mmHg 96 %   10/18/14 2350 98 ??F (36.7 ??C) 95 16 132/56 mmHg 99 %       Patient verbalized understanding of discharge instructions. Dual AVS reviewed with Gerre PebblesLisa Watson, RN.  All medications reviewed individually with patient.  Opportunities for questions and concerns provided.  Patient discharged via (mode of transport ie. Car, ambulance or air transport) taxi.  Patient's arm band appropriately discarded.

## 2014-10-20 ENCOUNTER — Inpatient Hospital Stay: Admit: 2014-10-20 | Payer: MEDICARE | Primary: Family Medicine

## 2014-10-20 DIAGNOSIS — S31829S Unspecified open wound of left buttock, sequela: Secondary | ICD-10-CM

## 2014-10-22 LAB — CULTURE, SURGICAL WOUND W GRAM STAIN

## 2014-10-24 LAB — CULTURE, ANAEROBIC

## 2014-10-25 ENCOUNTER — Emergency Department: Admit: 2014-10-25 | Payer: MEDICARE | Primary: Family Medicine

## 2014-10-25 ENCOUNTER — Inpatient Hospital Stay
Admit: 2014-10-25 | Discharge: 2014-10-29 | Disposition: A | Discharge status: Home Health | Payer: MEDICARE | Attending: Adult Medicine | Admitting: Adult Medicine

## 2014-10-25 DIAGNOSIS — K611 Rectal abscess: Secondary | ICD-10-CM

## 2014-10-25 LAB — METABOLIC PANEL, BASIC
Anion gap: 7 mmol/L (ref 3.0–18)
BUN/Creatinine ratio: 10 — ABNORMAL LOW (ref 12–20)
BUN: 10 MG/DL (ref 7.0–18)
CO2: 28 mmol/L (ref 21–32)
Calcium: 8.1 MG/DL — ABNORMAL LOW (ref 8.5–10.1)
Chloride: 99 mmol/L — ABNORMAL LOW (ref 100–108)
Creatinine: 1 MG/DL (ref 0.6–1.3)
GFR est AA: >60 mL/min/{1.73_m2} (ref >60)
GFR est non-AA: >60 mL/min/{1.73_m2} (ref >60)
Glucose: 375 mg/dL — ABNORMAL HIGH (ref 74–99)
Potassium: 3.8 mmol/L (ref 3.5–5.5)
Sodium: 134 mmol/L — ABNORMAL LOW (ref 136–145)

## 2014-10-25 LAB — CBC WITH AUTOMATED DIFF
ABS. BASOPHILS: 0 10*3/uL (ref 0.0–0.06)
ABS. EOSINOPHILS: 0.3 10*3/uL (ref 0.0–0.4)
ABS. LYMPHOCYTES: 2.4 10*3/uL (ref 0.9–3.6)
ABS. MONOCYTES: 1 10*3/uL (ref 0.05–1.2)
ABS. NEUTROPHILS: 4.8 10*3/uL (ref 1.8–8.0)
BASOPHILS: 0 % (ref 0–2)
EOSINOPHILS: 4 % (ref 0–5)
HCT: 38.6 % (ref 36.0–48.0)
HGB: 13 g/dL (ref 13.0–16.0)
LYMPHOCYTES: 28 % (ref 21–52)
MCH: 28.9 PG (ref 24.0–34.0)
MCHC: 33.7 g/dL (ref 31.0–37.0)
MCV: 85.8 FL (ref 74.0–97.0)
MONOCYTES: 11 % — ABNORMAL HIGH (ref 3–10)
MPV: 9.3 FL (ref 9.2–11.8)
NEUTROPHILS: 57 % (ref 40–73)
PLATELET: 492 10*3/uL — ABNORMAL HIGH (ref 135–420)
RBC: 4.5 M/uL — ABNORMAL LOW (ref 4.70–5.50)
RDW: 12.3 % (ref 11.6–14.5)
WBC: 8.5 10*3/uL (ref 4.6–13.2)

## 2014-10-25 LAB — CARDIAC PANEL,(CK, CKMB & TROPONIN)
CK - MB: 0.9 ng/ml (ref 0.5–3.6)
CK-MB Index: 1.6 % (ref 0.0–4.0)
CK: 58 U/L (ref 39–308)
Troponin-I, QT: <0.02 NG/ML (ref 0.00–0.06)

## 2014-10-25 LAB — GLUCOSE, POC
Glucose (POC): 342 mg/dL — ABNORMAL HIGH (ref 70–110)
Glucose (POC): 146 mg/dL — ABNORMAL HIGH (ref 70–110)
Glucose (POC): 297 mg/dL — ABNORMAL HIGH (ref 70–110)
Glucose (POC): 268 mg/dL — ABNORMAL HIGH (ref 70–110)

## 2014-10-25 MED ORDER — GLUCOSE 4 GRAM CHEWABLE TAB
4 gram | ORAL | Status: DC | PRN
Start: 2014-10-25 — End: 2014-10-29
  Administered 2014-10-27 – 2014-10-28 (×2): via ORAL

## 2014-10-25 MED ORDER — SODIUM CHLORIDE 0.9 % IJ SYRG
INTRAMUSCULAR | Status: DC | PRN
Start: 2014-10-25 — End: 2014-10-29
  Administered 2014-10-25 (×2): via INTRAVENOUS

## 2014-10-25 MED ORDER — ASPIRIN 81 MG CHEWABLE TAB
81 mg | Freq: Every day | ORAL | Status: DC
Start: 2014-10-25 — End: 2014-10-29
  Administered 2014-10-25 – 2014-10-29 (×5): via ORAL

## 2014-10-25 MED ORDER — SODIUM CHLORIDE 0.9 % IV
10 gram | Freq: Two times a day (BID) | INTRAVENOUS | Status: DC
Start: 2014-10-25 — End: 2014-10-27
  Administered 2014-10-25 – 2014-10-27 (×4): via INTRAVENOUS

## 2014-10-25 MED ORDER — ZOLPIDEM 5 MG TAB
5 mg | Freq: Every evening | ORAL | Status: DC | PRN
Start: 2014-10-25 — End: 2014-10-29

## 2014-10-25 MED ORDER — INSULIN REGULAR HUMAN 100 UNIT/ML INJECTION
100 unit/mL | INTRAMUSCULAR | Status: AC
Start: 2014-10-25 — End: 2014-10-25
  Administered 2014-10-25: 10:00:00 via INTRAVENOUS

## 2014-10-25 MED ORDER — INSULIN GLARGINE 100 UNIT/ML INJECTION
100 unit/mL | Freq: Every evening | SUBCUTANEOUS | Status: DC
Start: 2014-10-25 — End: 2014-10-29
  Administered 2014-10-26 – 2014-10-29 (×4): via SUBCUTANEOUS

## 2014-10-25 MED ORDER — ONDANSETRON (PF) 4 MG/2 ML INJECTION
4 mg/2 mL | INTRAMUSCULAR | Status: DC | PRN
Start: 2014-10-25 — End: 2014-10-29

## 2014-10-25 MED ORDER — SODIUM CHLORIDE 0.9 % IV
INTRAVENOUS | Status: DC
Start: 2014-10-25 — End: 2014-10-26

## 2014-10-25 MED ORDER — DEXTROSE 50% IN WATER (D50W) IV SYRG
INTRAVENOUS | Status: DC | PRN
Start: 2014-10-25 — End: 2014-10-29
  Administered 2014-10-29 (×2): via INTRAVENOUS

## 2014-10-25 MED ORDER — OXYCODONE-ACETAMINOPHEN 5 MG-325 MG TAB
5-325 mg | ORAL | Status: DC | PRN
Start: 2014-10-25 — End: 2014-10-29
  Administered 2014-10-25 – 2014-10-29 (×18): via ORAL

## 2014-10-25 MED ORDER — PHARMACY INFORMATION NOTE
Status: DC
Start: 2014-10-25 — End: 2014-10-29

## 2014-10-25 MED ORDER — PRAVASTATIN 20 MG TAB
20 mg | Freq: Every evening | ORAL | Status: DC
Start: 2014-10-25 — End: 2014-10-29
  Administered 2014-10-26 – 2014-10-29 (×4): via ORAL

## 2014-10-25 MED ORDER — ATENOLOL 25 MG TAB
25 mg | Freq: Two times a day (BID) | ORAL | Status: DC
Start: 2014-10-25 — End: 2014-10-29
  Administered 2014-10-25 – 2014-10-27 (×4): via ORAL

## 2014-10-25 MED ORDER — SODIUM CHLORIDE 0.9 % IV
INTRAVENOUS | Status: DC
Start: 2014-10-25 — End: 2014-10-29
  Administered 2014-10-25 – 2014-10-29 (×6): via INTRAVENOUS

## 2014-10-25 MED ORDER — HYDROMORPHONE (PF) 1 MG/ML IJ SOLN
1 mg/mL | INTRAMUSCULAR | Status: AC
Start: 2014-10-25 — End: 2014-10-25
  Administered 2014-10-25: 08:00:00 via INTRAVENOUS

## 2014-10-25 MED ORDER — HYDROMORPHONE (PF) 1 MG/ML IJ SOLN
1 mg/mL | INTRAMUSCULAR | Status: AC
Start: 2014-10-25 — End: 2014-10-25
  Administered 2014-10-25: 06:00:00 via INTRAVENOUS

## 2014-10-25 MED ORDER — INSULIN LISPRO 100 UNIT/ML INJECTION
100 unit/mL | Freq: Four times a day (QID) | SUBCUTANEOUS | Status: DC
Start: 2014-10-25 — End: 2014-10-25
  Administered 2014-10-25 (×2): via SUBCUTANEOUS

## 2014-10-25 MED ORDER — DOCUSATE SODIUM 100 MG CAP
100 mg | Freq: Two times a day (BID) | ORAL | Status: DC
Start: 2014-10-25 — End: 2014-10-29
  Administered 2014-10-25 – 2014-10-29 (×9): via ORAL

## 2014-10-25 MED ORDER — SODIUM CHLORIDE 0.9 % IJ SYRG
Freq: Three times a day (TID) | INTRAMUSCULAR | Status: DC
Start: 2014-10-25 — End: 2014-10-29
  Administered 2014-10-25 – 2014-10-29 (×6): via INTRAVENOUS

## 2014-10-25 MED ORDER — PIPERACILLIN-TAZOBACTAM 3.375 GRAM IV SOLR
3.375 gram | INTRAVENOUS | Status: AC
Start: 2014-10-25 — End: 2014-10-25
  Administered 2014-10-25: 09:00:00 via INTRAVENOUS

## 2014-10-25 MED ORDER — IOPAMIDOL 61 % IV SOLN
300 mg iodine /mL (61 %) | Freq: Once | INTRAVENOUS | Status: AC
Start: 2014-10-25 — End: 2014-10-25
  Administered 2014-10-25: 08:00:00 via INTRAVENOUS

## 2014-10-25 MED ORDER — PIPERACILLIN-TAZOBACTAM 3.375 GRAM IV SOLR
3.375 gram | Freq: Four times a day (QID) | INTRAVENOUS | Status: DC
Start: 2014-10-25 — End: 2014-10-29
  Administered 2014-10-25 – 2014-10-29 (×17): via INTRAVENOUS

## 2014-10-25 MED ORDER — VANCOMYCIN 1,000 MG IV SOLR
1000 mg | Freq: Once | INTRAVENOUS | Status: AC
Start: 2014-10-25 — End: 2014-10-25
  Administered 2014-10-25: 10:00:00 via INTRAVENOUS

## 2014-10-25 MED ORDER — GLUCAGON 1 MG INJECTION
1 mg | INTRAMUSCULAR | Status: DC | PRN
Start: 2014-10-25 — End: 2014-10-29

## 2014-10-25 MED ORDER — ONDANSETRON (PF) 4 MG/2 ML INJECTION
4 mg/2 mL | Freq: Once | INTRAMUSCULAR | Status: AC
Start: 2014-10-25 — End: 2014-10-25
  Administered 2014-10-25: 06:00:00 via INTRAVENOUS

## 2014-10-25 MED ORDER — MORPHINE 2 MG/ML INJECTION
2 mg/mL | INTRAMUSCULAR | Status: DC | PRN
Start: 2014-10-25 — End: 2014-10-29
  Administered 2014-10-28: via INTRAVENOUS

## 2014-10-25 MED ORDER — DIPHENHYDRAMINE HCL 50 MG/ML IJ SOLN
50 mg/mL | INTRAMUSCULAR | Status: DC | PRN
Start: 2014-10-25 — End: 2014-10-29

## 2014-10-25 MED ORDER — HYDROMORPHONE (PF) 1 MG/ML IJ SOLN
1 mg/mL | INTRAMUSCULAR | Status: AC
Start: 2014-10-25 — End: 2014-10-25
  Administered 2014-10-25: 10:00:00 via INTRAVENOUS

## 2014-10-25 MED FILL — VANCOMYCIN 10 GRAM IV SOLR: 10 gram | INTRAVENOUS | Qty: 1250

## 2014-10-25 MED FILL — ATENOLOL 25 MG TAB: 25 mg | ORAL | Qty: 1

## 2014-10-25 MED FILL — HYDROMORPHONE (PF) 1 MG/ML IJ SOLN: 1 mg/mL | INTRAMUSCULAR | Qty: 1

## 2014-10-25 MED FILL — PHARMACY INFORMATION NOTE: Qty: 1

## 2014-10-25 MED FILL — DOCUSATE SODIUM 100 MG CAP: 100 mg | ORAL | Qty: 1

## 2014-10-25 MED FILL — HUMALOG U-100 INSULIN 100 UNIT/ML SUBCUTANEOUS SOLUTION: 100 unit/mL | SUBCUTANEOUS | Qty: 3

## 2014-10-25 MED FILL — ISOVUE-300  61 % INTRAVENOUS SOLUTION: 300 mg iodine /mL (61 %) | INTRAVENOUS | Qty: 100

## 2014-10-25 MED FILL — ONDANSETRON (PF) 4 MG/2 ML INJECTION: 4 mg/2 mL | INTRAMUSCULAR | Qty: 2

## 2014-10-25 MED FILL — BD POSIFLUSH NORMAL SALINE 0.9 % INJECTION SYRINGE: INTRAMUSCULAR | Qty: 10

## 2014-10-25 MED FILL — XYLOCAINE-MPF 20 MG/ML (2 %) INJECTION SOLUTION: 20 mg/mL (2 %) | INTRAMUSCULAR | Qty: 3

## 2014-10-25 MED FILL — SODIUM CHLORIDE 0.9 % IV: INTRAVENOUS | Qty: 1000

## 2014-10-25 MED FILL — OXYCODONE-ACETAMINOPHEN 5 MG-325 MG TAB: 5-325 mg | ORAL | Qty: 2

## 2014-10-25 MED FILL — GLYCOPYRROLATE 0.2 MG/ML IJ SOLN: 0.2 mg/mL | INTRAMUSCULAR | Qty: 1

## 2014-10-25 MED FILL — ASPIRIN 81 MG CHEWABLE TAB: 81 mg | ORAL | Qty: 1

## 2014-10-25 MED FILL — SODIUM CHLORIDE 0.9 % IV PIGGY BACK: INTRAVENOUS | Qty: 100

## 2014-10-25 MED FILL — METOPROLOL TARTRATE 5 MG/5 ML IV SOLN: 5 mg/ mL | INTRAVENOUS | Qty: 2

## 2014-10-25 MED FILL — PIPERACILLIN-TAZOBACTAM 3.375 GRAM IV SOLR: 3.375 gram | INTRAVENOUS | Qty: 3.38

## 2014-10-25 MED FILL — LACTATED RINGERS IV: INTRAVENOUS | Qty: 1000

## 2014-10-25 MED FILL — VANCOMYCIN 1,000 MG IV SOLR: 1000 mg | INTRAVENOUS | Qty: 1000

## 2014-10-25 MED FILL — HUMULIN R REGULAR U-100 INSULIN 100 UNIT/ML INJECTION SOLUTION: 100 unit/mL | INTRAMUSCULAR | Qty: 3

## 2014-10-25 MED FILL — PROPOFOL 10 MG/ML IV EMUL: 10 mg/mL | INTRAVENOUS | Qty: 25

## 2014-10-25 NOTE — H&P (View-Only) (Signed)
Consult Note    Patient: Tracy [name] MRN: [id number]  CSN: [id number]    Date of Birth: 01/07/1956  Age: 58 y.o.  Sex: male    DOA: 10/25/2014 LOS:  LOS: 0 days        Requesting Physician: Hospitalist  Reason for Consultation: Perrectal abscess               HPI:     Tracy [name] is a 58 y.o. male who has been seen for perirectal abscess.  Seen and operated on about a week ago with same problem.  Uncontrolled DM.  Seen in wound clinic he says.  Wound still draining.    Past Medical History   Diagnosis Date   ??? CAD (coronary artery disease)    ??? Diabetes (HCC)    ??? MI (myocardial infarction) (HCC)    ??? Hypertension    ??? High cholesterol    ??? Chronic pain    ??? Arthritis        Past Surgical History   Procedure Laterality Date   ??? Pr cardiac surg procedure unlist     ??? Hx coronary stent placement         Family History   Problem Relation Age of Onset   ??? Diabetes Mother    ??? Heart Disease Mother    ??? Diabetes Father        History     Social History   ??? Marital Status: SINGLE     Spouse Name: N/A     Number of Children: N/A   ??? Years of Education: N/A     Social History Main Topics   ??? Smoking status: Former Smoker   ??? Smokeless tobacco: Not on file   ??? Alcohol Use: Yes   ??? Drug Use: Not on file   ??? Sexual Activity: Not on file     Other Topics Concern   ??? Not on file     Social History Narrative       Prior to Admission medications    Medication Sig Start Date End Date Taking? Authorizing Provider   oxyCODONE-acetaminophen (PERCOCET) 5-325 mg per tablet Take 1-2 Tabs by mouth every four (4) hours as needed for Pain. Max Daily Amount: 12 Tabs. 10/19/14   Mirza Fessel, MD   ciprofloxacin HCl (CIPRO) 250 mg tablet Take 2 Tabs by mouth every twelve (12) hours for 10 days. 10/19/14 10/29/14  Taniaya Rudder, MD   metroNIDAZOLE (FLAGYL) 500 mg tablet Take 1 Tab by mouth three (3) times daily for 10 days. 10/19/14 10/29/14  Javanna Patin, MD   atenolol (TENORMIN) 25 mg tablet Take 25 mg by mouth two (2) times a day.     Historical Provider   insulin glargine (LANTUS) 100 unit/mL injection 75 Units by SubCUTAneous route nightly.    Historical Provider   oxyCODONE-acetaminophen (PERCOCET 7.5) 7.5-325 mg per tablet Take 1 Tab by mouth two (2) times daily as needed for Pain.    Historical Provider   morphine CR (MS CONTIN) 30 mg CR tablet Take 30 mg by mouth every twelve (12) hours as needed.    Historical Provider   pravastatin (PRAVACHOL) 80 mg tablet Take 80 mg by mouth nightly.    Historical Provider   docusate sodium (COLACE) 100 mg capsule Take 1 Cap by mouth two (2) times a day. 10/13/14   Robert C Dearnley, PA   insulin aspart (NOVOLOG) 100 unit/mL injection 30 Units by SubCUTAneous route Before breakfast, lunch, and dinner.      Historical Provider   aspirin 81 mg chewable tablet Take 81 mg by mouth daily.    Phys Other, MD   clopidogrel (PLAVIX) 75 mg tablet Take 75 mg by mouth daily.    Phys Other, MD       No Known Allergies    Review of Systems  A comprehensive review of systems was negative except for that written in the History of Present Illness.      Physical Exam:      Visit Vitals   Item Reading   ??? BP 137/80 mmHg   ??? Pulse 61   ??? Temp 98.2 ??F (36.8 ??C)   ??? Resp 16   ??? Ht 5' 11" (1.803 m)   ??? Wt 83.915 kg (185 lb)   ??? BMI 25.81 kg/m2   ??? SpO2 100%       Physical Exam:  Physical Exam:   General:  Alert, cooperative, no distress, appears stated age.   Eyes:  Conjunctivae/corneas clear. PERRL, EOMs intact. Fundi benign   Ears:  Normal TMs and external ear canals both ears.   Nose: Nares normal. Septum midline. Mucosa normal. No drainage or sinus tenderness.   Mouth/Throat: Lips, mucosa, and tongue normal. Teeth and gums normal.   Neck: Supple, symmetrical, trachea midline, no adenopathy, thyroid: no enlargement/tenderness/nodules, no carotid bruit and no JVD.   Back:   Symmetric, no curvature. ROM normal. No CVA tenderness.   Lungs:   Clear to auscultation bilaterally.    Heart:  Regular rate and rhythm, S1, S2 normal, no murmur, click, rub or gallop.   Abdomen:   Soft, non-tender. Bowel sounds normal. No masses,  No organomegaly.  Open perirectal wound with drainage.   Extremities: Extremities normal, atraumatic, no cyanosis or edema.   Pulses: 2+ and symmetric all extremities.   Skin: Skin color, texture, turgor normal. No rashes or lesions   Lymph nodes: Cervical, supraclavicular, and axillary nodes normal.   Neurologic: CNII-XII intact. Normal strength, sensation and reflexes throughout.       Labs Reviewed:  All lab results for the last 24 hours reviewed.    Assessment/Plan     Principal Problem:    Perirectal abscess (10/18/2014)    Active Problems:    Diabetes (HCC) (10/25/2014)      Cellulitis (10/25/2014)      Hyponatremia (10/25/2014)        Rec wound care with wound vac.  Suspect he may have a rectal fistula now if he didn't have one before.

## 2014-10-25 NOTE — Other (Signed)
GLYCEMIC CONTROL AND NUTRITION    Assessment/Recommendations:  - pt needs mealtime dose in addition to corrective coverage, recommend decreased dose of Humalog 10 units qac (home dose 30 units)    - current orders include decreased basal dose of Lantus 40 units qhs and Humalog corrective coverage  - pt with known h/o uncontrolled DM, hypoglycemic (51) event noted during admission last week s/p I&D of peri rectal abscess, was placed on home dose insulin regimen  - currently on clear liquid diet    Most recent blood glucose values:     Lab Results   Component Value Date/Time    GLU 375* 10/25/2014 01:05 AM    GLUCPOC 146* 10/25/2014 07:12 AM    GLUCPOC 342* 10/25/2014 04:54 AM       Current A1C  is equivalent to average blood glucose of 297 mg/dl over the past 2-3 months.    Lab Results   Component Value Date/Time    HEMOGLOBIN A1C 11.9 10/18/2014 01:28 PM         Current hospital diabetes medications:   - Lantus 40 units qhs  - Humalog Normal Insulin Sensitivity Corrective Coverage    Previous day's insulin requirements:   <24 hours    Home diabetes medications:  - Lantus 75 units qhs  - Novalog 30 units QAC    Anthropometrics:   Body mass index is 25.81 kg/(m^2).  Last 3 Recorded Weights in this Encounter    10/25/14 0050   Weight: 83.915 kg (185 lb)      Ht Readings from Last 1 Encounters:   10/25/14 5\' 11"  (1.803 m)       Diet:    Active Orders   Diet    DIET CLEAR LIQUID       Intake:   No data found.          Elwyn ReachM. Catherine Brown, RD

## 2014-10-25 NOTE — Other (Signed)
TRANSFER - OUT REPORT:    Verbal report given to Graciella Freeria Bowser, RN  on [name]  being transferred to medical for routine progression of care       Report consisted of patient???s Situation, Background, Assessment and   Recommendations(SBAR).     Information from the following report(s) SBAR, Kardex, ED Summary, Procedure Summary, Intake/Output, MAR and Recent Results was reviewed with the receiving nurse.    Lines:   Peripheral IV 10/25/14 Left Antecubital (Active)   Site Assessment Clean, dry, & intact 10/25/2014  1:05 AM   Phlebitis Assessment 0 10/25/2014  1:05 AM   Infiltration Assessment 0 10/25/2014  1:05 AM   Dressing Status Clean, dry, & intact 10/25/2014  1:05 AM   Dressing Type 4 X 4 10/25/2014  1:05 AM   Hub Color/Line Status Pink 10/25/2014  1:05 AM        Opportunity for questions and clarification was provided.      Patient transported with:   [employer]

## 2014-10-25 NOTE — Other (Signed)
Bedside shift change report given to Alfreda Banks, RN (oncoming nurse) by Kristie A Reeve, RN   (offgoing nurse). Report included the following information SBAR, Kardex, Intake/Output and MAR.

## 2014-10-25 NOTE — ED Provider Notes (Signed)
HPI Comments:   12:48 AM  59 y.o. male presents to ED via EMS for a wound check. Pt had a pilonidal cyst removed by Dr. Argie Ramming in the OR 1 week ago, states he has been caring for himself, and the pain has not improved. Pt is also c/o chest pain onset earlier this morning that radiates to his left arm. He is also c/o generalized abd pain. PMHx includes CAD, 2 MIs, HTN, diabetes, hypercholesteremia, and arthritis. PSHx includes coronary stent placement. Patient denies any other symptoms or complaints.     Patient is a 59 y.o. male presenting with wound check and chest pain. The history is provided by the patient. No language interpreter was used.   Wound Check   This is a new problem. Pain location: buttocks. Pertinent negatives include no back pain and no neck pain.   Chest Pain (Angina)   This is a new problem. The current episode started 6 to 12 hours ago. The pain radiates to the left arm. Associated symptoms include abdominal pain. Pertinent negatives include no back pain, no cough, no fever, no headaches, no nausea, no palpitations, no shortness of breath and no vomiting. Risk factors include cardiac disease, hypertension, male gender and diabetes mellitus. His past medical history is significant for DM and HTN.Procedural history includes cardiac stents.     Written by Stormy Card, ED Scribe, as dictated by Norwood Levo, MD     Past Medical History:   Diagnosis Date   ??? CAD (coronary artery disease)    ??? Diabetes ([place])    ??? MI (myocardial infarction) ([place])    ??? Hypertension    ??? High cholesterol    ??? Chronic pain    ??? Arthritis        Past Surgical History:   Procedure Laterality Date   ??? Pr cardiac surg procedure unlist     ??? Hx coronary stent placement           Family History:   Problem Relation Age of Onset   ??? Diabetes Mother    ??? Heart Disease Mother    ??? Diabetes Father        History     Social History   ??? Marital Status: SINGLE     Spouse Name: N/A     Number of Children: N/A    ??? Years of Education: N/A     Occupational History   ??? Not on file.     Social History Main Topics   ??? Smoking status: Former Smoker   ??? Smokeless tobacco: Not on file   ??? Alcohol Use: Yes   ??? Drug Use: Not on file   ??? Sexual Activity: Not on file     Other Topics Concern   ??? Not on file     Social History Narrative       ALLERGIES: Review of patient's allergies indicates no known allergies.      Review of Systems   Constitutional: Negative for fever, activity change, appetite change and unexpected weight change.   HENT: Negative for congestion and sore throat.    Eyes: Negative for pain and redness.   Respiratory: Negative for cough and shortness of breath.    Cardiovascular: Positive for chest pain. Negative for palpitations.   Gastrointestinal: Positive for abdominal pain. Negative for nausea, vomiting and diarrhea.   Endocrine: Negative for polydipsia and polyuria.   Genitourinary: Negative for dysuria and difficulty urinating.   Musculoskeletal: Positive for myalgias (left arm). Negative  for back pain and neck pain.   Skin: Positive for wound (pilonidal cyst to buttocks). Negative for pallor and rash.   Neurological: Negative for headaches.   All other systems reviewed and are negative.      Filed Vitals:    10/26/14 0536 10/26/14 1004 10/26/14 1600 10/26/14 2254   BP: 142/78 135/57 137/67 122/55   Pulse: 54 56 62 60   Temp: 97.6 ??F (36.4 ??C) 98.4 ??F (36.9 ??C) 99 ??F (37.2 ??C) 98.6 ??F (37 ??C)   Resp: 12 16 16 18    Height:       Weight:    79.788 kg (175 lb 14.4 oz)   SpO2: 100% 100% 99% 99%            Physical Exam   Constitutional: He is oriented to person, place, and time. He appears well-developed and well-nourished.   HENT:   Head: Normocephalic and atraumatic.   Right Ear: External ear normal.   Left Ear: External ear normal.   Nose: Nose normal.   Mouth/Throat: Oropharynx is clear and moist.   Eyes: Conjunctivae and EOM are normal. Pupils are equal, round, and reactive to light.    Neck: Normal range of motion. Neck supple. No JVD present. No tracheal deviation present. No thyromegaly present.   Cardiovascular: Normal rate, regular rhythm, normal heart sounds and intact distal pulses.  Exam reveals no gallop and no friction rub.    No murmur heard.  Pulmonary/Chest: Effort normal and breath sounds normal. No respiratory distress. He has no wheezes. He has no rales.   Abdominal: Soft. Bowel sounds are normal. He exhibits no distension and no mass. There is no tenderness. There is no rebound and no guarding.   Musculoskeletal: Normal range of motion.   Left buttock abscess which is tender and firm with puss expressed on palpation and marked fullness with induration   Neurological: He is alert and oriented to person, place, and time. He has normal reflexes. No cranial nerve deficit.   Distal neurovascular exam intact to light touch and position sense.   Skin: Skin is warm and dry. No rash noted.   Psychiatric: He has a normal mood and affect. His behavior is normal.   Nursing note and vitals reviewed.     RESULTS:    EKG interpretation: (Preliminary)  1:02 AM   NSR @ 79 bpm  With nonspecific T wave changes  EKG read by Norwood Levo, MD    X-RAY FINDINGS:  12:58 AM  Chest X-ray shows no acute cardiopulmonary processes.  Pending review by Radiologist  Recorded by Salley Slaughter, ED Scribe, as dictated by Norwood Levo, MD     CT PELV W CONT   Final Result   IMPRESSION:         1. Pronounced perianal/perirectal inflammatory changes with an ulcerative skin   defect in the left medial gluteal cleft and associated skin thickening as well   as scattered subcutaneous emphysema extending into the ischioanal fossa. No   drainable fluid collection is identified.   2. Trace  pelvic free fluid which is nonspecific and may be physiologic. No   organized pelvic fluid collection.         XR CHEST PORT   Preliminary Result           Labs Reviewed    CBC WITH AUTOMATED DIFF - Abnormal; Notable for the following:     RBC 4.50 (*)     PLATELET 492 (*)  MONOCYTES 11 (*)     All other components within normal limits   METABOLIC PANEL, BASIC - Abnormal; Notable for the following:     Sodium 134 (*)     Chloride 99 (*)     Glucose 375 (*)     BUN/Creatinine ratio 10 (*)     Calcium 8.1 (*)     All other components within normal limits   CBC WITH AUTOMATED DIFF - Abnormal; Notable for the following:     RBC 4.05 (*)     HGB 11.6 (*)     HCT 35.3 (*)     PLATELET 462 (*)     MPV 9.1 (*)     MONOCYTES 11 (*)     All other components within normal limits   METABOLIC PANEL, COMPREHENSIVE - Abnormal; Notable for the following:     Potassium 3.4 (*)     Glucose 125 (*)     Calcium 8.0 (*)     AST 43 (*)     Alk. phosphatase 35 (*)     Albumin 2.3 (*)     Globulin 4.1 (*)     A-G Ratio 0.6 (*)     All other components within normal limits   GLUCOSE, POC - Abnormal; Notable for the following:     Glucose (POC) 342 (*)     All other components within normal limits   GLUCOSE, POC - Abnormal; Notable for the following:     Glucose (POC) 146 (*)     All other components within normal limits   GLUCOSE, POC - Abnormal; Notable for the following:     Glucose (POC) 297 (*)     All other components within normal limits   GLUCOSE, POC - Abnormal; Notable for the following:     Glucose (POC) 268 (*)     All other components within normal limits   GLUCOSE, POC - Abnormal; Notable for the following:     Glucose (POC) 252 (*)     All other components within normal limits   CULTURE, WOUND W GRAM STAIN   CARDIAC PANEL,(CK, CKMB & TROPONIN)   CBC WITH AUTOMATED DIFF   METABOLIC PANEL, COMPREHENSIVE   VANCOMYCIN, TROUGH   GLUCOSE, POC   GLUCOSE, POC   GLUCOSE, POC   GLUCOSE, POC       Recent Results (from the past 12 hour(s))   GLUCOSE, POC    Collection Time: 10/26/14  4:28 PM   Result Value Ref Range    Glucose (POC) 110 70 - 110 mg/dL   GLUCOSE, POC    Collection Time: 10/26/14  9:04 PM    Result Value Ref Range    Glucose (POC) 81 70 - 110 mg/dL        MDM  Number of Diagnoses or Management Options  Cellulitis:   Diabetes mellitus due to underlying condition with hyperglycemia ([place]):   Hyperglycemia:   Peri-rectal abscess:   Diagnosis management comments: INITIAL CLINICAL IMPRESSION and PLANS:  The patient presents with the primary complaint(s) of: wound check. The presentation, to include historical aspects and clinical findings are consistent with the DX of abscess. However, other possible DX's to consider as primary, associated with, or exacerbated by include:    1.  Cellulitis  2.  Sepsis  3.  Chest pain  4.  ACS    Considering the above, my initial management plan to evaluate and therapeutic interventions include the following and as noted in the orders:  1.  Labs: Cardiac Panel, CBC, BMP  2.  Imaging: EKG, Chest X-ray, CT Abd/Pelv  3.  Medications: Dilaudid, Zofran    The patient had marked left buttock pain in the ED. Labs remarkable for hyperglycemia. Pelvic CT scan shows left gluteal cellulitis without drainable fluid collection. The patient was given Zosyn and Vancomycin IV. Patient was given dilaudid for pain.   Case discussed with Dr. Argie Ramming who agrees with admission to medicine with surgical consultation.  Case discussed with Dr. Deforest Hoyles who agrees with admission.       Amount and/or Complexity of Data Reviewed  Clinical lab tests: ordered and reviewed  Tests in the radiology section of CPT??: ordered and reviewed (CT Abd/Pelv, Chest X-ray)  Tests in the medicine section of CPT??: ordered and reviewed (EKG)  Independent visualization of images, tracings, or specimens: yes (Chest X-ray, EKG)      MEDICATIONS GIVEN:  Medications   sodium chloride (NS) flush 5-10 mL (10 mL IntraVENous Given 10/26/14 2228)   sodium chloride (NS) flush 5-10 mL (10 mL IntraVENous Given 10/25/14 0324)   0.9% sodium chloride infusion (75 mL/hr IntraVENous Rate Change 10/26/14 0746)    morphine injection 4 mg (not administered)   atenolol (TENORMIN) tablet 25 mg (25 mg Oral Given 10/26/14 1819)   insulin glargine (LANTUS) injection 40 Units (40 Units SubCUTAneous Given 10/26/14 2226)   oxyCODONE-acetaminophen (PERCOCET) 5-325 mg per tablet 2 Tab (2 Tabs Oral Given 10/26/14 2104)   pravastatin (PRAVACHOL) tablet 80 mg (80 mg Oral Given 10/26/14 2300)   docusate sodium (COLACE) capsule 100 mg (100 mg Oral Given 10/26/14 1819)   aspirin chewable tablet 81 mg (81 mg Oral Given 10/26/14 0807)   diphenhydrAMINE (BENADRYL) injection 12.5 mg (not administered)   ondansetron (ZOFRAN) injection 4 mg (not administered)   zolpidem (AMBIEN) tablet 5 mg (not administered)   piperacillin-tazobactam (ZOSYN) 3.375 g in 0.9% sodium chloride (MBP/ADV) 100 mL ADV (3.375 g IntraVENous Given 10/26/14 2227)   glucose chewable tablet 16 g (not administered)   glucagon (GLUCAGEN) injection 1 mg (not administered)   dextrose (D50W) injection syrg 12.5-25 g (not administered)   vancomycin (VANCOCIN) 1,250 mg in 0.9% sodium chloride 250 mL IVPB (1,250 mg IntraVENous New Bag 10/26/14 1819)   Vancomycin- pharmacy to dose (not administered)   insulin lispro (HUMALOG) injection (0 Units SubCUTAneous Held 10/26/14 2200)   HYDROmorphone (DILAUDID) injection 0.5-1 mg (1 mg IntraVENous Given 10/26/14 2226)   insulin lispro (HUMALOG) injection 15 Units (15 Units SubCUTAneous Given 10/26/14 1631)   potassium chloride (K-DUR, KLOR-CON) SR tablet 20 mEq (20 mEq Oral Given 10/26/14 0807)   Vancomycin TROUGH @ 0430 ON 10-27-14.  Please HOLD the scheduled 0500 dose pending review of lab by pharmacist. Marina Gravel (not administered)   HYDROmorphone (PF) (DILAUDID) injection 1 mg (1 mg IntraVENous Given 10/25/14 0110)   ondansetron (ZOFRAN) injection 4 mg (4 mg IntraVENous Given 10/25/14 0110)   iopamidol (ISOVUE 300) 61 % contrast injection 50-100 mL (100 mL IntraVENous Given 10/25/14 0235)   HYDROmorphone (PF) (DILAUDID) injection 1 mg (1 mg IntraVENous Given 10/25/14 0324)    piperacillin-tazobactam (ZOSYN) injection 3.375 g (3.375 g IntraVENous Given 10/25/14 0426)   vancomycin (VANCOCIN) 1,000 mg in 0.9% sodium chloride (MBP/ADV) 250 mL adv (1,000 mg IntraVENous Continued On Admission 10/25/14 0459)   HYDROmorphone (PF) (DILAUDID) injection 1 mg (1 mg IntraVENous Given 10/25/14 0446)   insulin regular (NOVOLIN R, HUMULIN R) injection 10 Units (10 Units IntraVENous Given 10/25/14 0459)   amLODIPine (  NORVASC) tablet 5 mg (5 mg Oral Given 10/26/14 1027)        Procedures    PROGRESS NOTE:  12:48 AM  Initial assessment performed.  Written by Stormy Card, ED Scribe, as dictated by Norwood Levo, MD    CONSULT NOTE:   4:22 AM  Norwood Levo, MD spoke with Dr. Rex Kras   Specialty: Surgery  Discussed pt's hx, disposition, and available diagnostic and imaging results over the telephone. Reviewed care plans. Consulting physician agrees with plans as outlined. Dr. Argie Ramming recommends giving Zosyn and to admit the pt to medicine with him as a consult.   Written by Salley Slaughter, ED Scribe, as dictated by Norwood Levo, MD.     CONSULT NOTE:   4:39 AM  Norwood Levo, MD spoke with Lorenda Peck, MD   Specialty: Internal Medicine  Discussed pt's hx, disposition, and available diagnostic and imaging results over the telephone. Reviewed care plans. Consulting physician agrees with plans as outlined. Dr. Deforest Hoyles agrees to admit the pt to medicine.   Written by Salley Slaughter, ED Scribe, as dictated by Norwood Levo, MD.     ADMISSION NOTE:  4:39 AM  Patient is being admitted to the hospital by Lorenda Peck, MD. The results of their tests and reasons for their admission have been discussed with them and/or available family. They convey agreement and understanding for the need to be admitted and for their admission diagnosis.    Written by Salley Slaughter, ED Scribe, as dictated by Norwood Levo, MD.    CLINICAL IMPRESSION    1. Peri-rectal abscess    2. Cellulitis    3. Hyperglycemia     4. Diabetes mellitus due to underlying condition with hyperglycemia [facility])         Written by Stormy Card, ED Scribe, as dictated by Norwood Levo, MD.       I agree with the above documentation as written by the scribe.  Norwood Levo, MD

## 2014-10-25 NOTE — Progress Notes (Signed)
Bedside and Verbal shift change report given to K. Anise Salvo, RN (Cabin crew) by Laurell Roof, RN (offgoing nurse). Report included the following information SBAR and Kardex. Patient resting in bed in NAD at this time. No express needs reported.

## 2014-10-25 NOTE — H&P (Signed)
History & Physical    Patient: Tracy [name] MRN: [id number]  CSN: [id number]    Date of Birth: 07/27/1956  Age: 59 y.o.  Sex: male      DOA: 10/25/2014  Primary Care Provider:  Phys Other, MD      Assessment/Plan     Patient Active Problem List   Diagnosis Code   ??? Drug overdose T50.901A   ??? Chest pain R07.9   ??? Perirectal abscess K61.1   ??? Surgery, other elective Z41.9   ??? Uncontrolled type II diabetes mellitus (HCC) E11.65   ??? Diabetes (HCC) E11.9   ??? Cellulitis L03.90   ??? Hyponatremia E87.1     -Cellulitis  -Perirectal abscess, s/p surgery with wound infection  -DM  -Hyponatremia  -Intractable pain    Plan;  -Admit to medicine  -Pain control  -IV hydration  -iv vancomycin and iv zosyn  -f/u wound culture  -Wound clinic consult  -f/u with surgery (Dr. Laurance Flatten)  -Basal and correctional insulin (lantus decreased from 75 units to 40 units due to decreased appetite and on clears)  -DVT ppx- SCDs      CC: Pain at the surgical site of the perirectal abscess       HPI:     Tracy [name] is a 59 y.o. male who presents to ED via EMS for a wound check and intractable pain. Pt had a pilonidal cyst of the left buttock removed by Dr. Laurance Flatten in the OR 1 week ago, states he has been doing dressing changes by himself, and the pain has not improved. Also c/o generalized abdominal and chest pain which comes and goes and exacerbated with the worsening of the buttock pain. PMHx includes CAD, 2 MIs, HTN, diabetes, hypercholesteremia, and arthritis. PSHx includes coronary stent placement. Pertinent negatives include no back pain, no cough, no fever, no headaches, no nausea, no palpitations, no shortness of breath and no vomiting. Patient denies any other symptoms or complaints. CT scan of the pelvis shows pronounced perirectal inflammation. Surgery (Dr. Laurance Flatten) consulted who requested medical admission due to extensive medical history including diabetes and CAD.       Past Medical History     Past Medical History:?? Diagnosis?? Date??    ????? CAD (coronary artery disease)?? ??   ????? Diabetes (HCC)?? ??   ????? MI (myocardial infarction) (HCC)?? ??   ????? Hypertension?? ??   ????? High cholesterol?? ??   ????? Chronic pain?? ??   ????? Arthritis?? ??        ????   Past Surgical History     Past Surgical History:??   Procedure?? Laterality?? Date??   ????? Pr cardiac surg procedure unlist?? ?? ??   ????? Hx coronary stent placement?? ?? ??        ?? ??  Family History:??   Problem?? Relation?? Age of Onset??   ????? Diabetes?? Mother?? ??   ????? Heart Disease?? Mother?? ??   ????? Diabetes?? Father?? ??     ????  History??   ????  Social History??   ????? Marital Status:?? SINGLE??   ?? ?? Spouse Name:?? N/A??   ?? ?? Number of Children:?? N/A??   ????? Years of Education:?? N/A??   ????  Occupational History??   ????? Not on file.??   ????  Social History Main Topics??   ????? Smoking status:?? Former Smoker??   ????? Smokeless tobacco:?? Not on file??   ????? Alcohol Use:?? Yes??   ????? Drug Use:?? Not on file??   ????? Sexual  Activity:?? Not on file??   ????  Other Topics?? Concern??   ????? Not on file??   ????  Social History Narrative??       ALLERGIES: Review of patient's allergies indicates no known allergies.      Review of Systems  Gen: No fever, chills, malaise, weight loss/gain.   Heent: No headache, rhinorrhea, epistaxis, ear pain, hearing loss, sinus pain, neck pain/stiffness, sore throat.   Heart: + chest pain, No palpitations, DOE, pnd, or orthopnea.   Resp: No cough, hemoptysis, wheezing and shortness of breath.   GI: + abdominal pain. No nausea, vomiting, diarrhea, constipation, melena or hematochezia.   GU: No urinary obstruction, dysuria or hematuria.   Derm: + wound (perirectal abscess). No rash, new skin lesion or pruritis.   Musc/skeletal: no bone or joint complains.  Vasc: No edema, cyanosis or claudication.   Endo: No heat/cold intolerance, no polyuria,polydipsia or polyphagia.   Neuro: No unilateral weakness, numbness, tingling. No seizures.   Heme: No easy bruising or bleeding.    ????  Filed Vitals:??   ?? 10/25/14 0050?? 10/25/14 0329?? 10/25/14 0400??    BP:?? 138/6?? 136/74?? 140/60??   Pulse:?? 75?? 72?? ??   Temp:?? 99.5 ??F (37.5 ??C)?? 98.6 ??F (37 ??C)?? ??   Resp:?? 22?? 16?? ??   Height:??  (1.803 m)?? ?? ??   Weight:?? 83.915 kg (185 lb)?? ?? ??   SpO2:?? 100%?? 100%?? 97%??   ????????     Physical Exam:  BP 141/82 mmHg   Pulse 78   Temp(Src) 98.5 ??F (36.9 ??C)   Resp 16   Ht  (1.803 m)   Wt 83.915 kg (185 lb)   BMI 25.81 kg/m2   SpO2 98%   O2 Device: Room air    Temp (24hrs), Avg:98.9 ??F (37.2 ??C), Min:98.5 ??F (36.9 ??C), Max:99.5 ??F (37.5 ??C)             General:  Awake, cooperative, no distress.   Head:  Normocephalic, without obvious abnormality, atraumatic.   Eyes:  Conjunctivae/corneas clear, sclera anicteric, PERRL, EOMs intact.   Nose: Nares normal. No drainage or sinus tenderness.   Throat: Lips, mucosa, and tongue normal.    Neck: Supple, symmetrical, trachea midline, no adenopathy.       Lungs:   Clear to auscultation bilaterally.       Heart:  Regular rate and rhythm, S1, S2 normal, no murmur, click, rub or gallop.     Abdomen: Soft, non-tender. Bowel sounds normal. No masses,  No organomegaly.   Extremities: Extremities normal, atraumatic, no cyanosis or edema. Capillary refill normal.   Pulses: 2+ and symmetric all extremities.   Skin: Left pilonidal abscess with marked induration and tenderness, + puss expressed on palpation   Neurologic: CNII-XII intact. No focal motor or sensory deficit.       RESULTS:  EKG; NSR @ 79 bpm. With nonspecific T wave changes    Chest X-ray shows no acute cardiopulmonary processes.      CT PELV W CONT??   Final Result??   IMPRESSION:??   ??   ??   1. Pronounced perianal/perirectal inflammatory changes with an ulcerative skin??   defect in the left medial gluteal cleft and associated skin thickening as well??   as scattered subcutaneous emphysema extending into the ischioanal fossa. No??   drainable fluid collection is identified.??   2. Trace?? pelvic free fluid which is nonspecific and may be physiologic. No??    organized pelvic fluid collection.??   ??   ??  XR CHEST PORT??   Preliminary Result??   ??   ??  ????    Prior to Admission medications    Medication Sig Start Date End Date Taking? Authorizing Provider   oxyCODONE-acetaminophen (PERCOCET) 5-325 mg per tablet Take 1-2 Tabs by mouth every four (4) hours as needed for Pain. Max Daily Amount: 12 Tabs. 10/19/14   Frankey Poot, MD   ciprofloxacin HCl (CIPRO) 250 mg tablet Take 2 Tabs by mouth every twelve (12) hours for 10 days. 10/19/14 10/29/14  Frankey Poot, MD   metroNIDAZOLE (FLAGYL) 500 mg tablet Take 1 Tab by mouth three (3) times daily for 10 days. 10/19/14 10/29/14  Frankey Poot, MD   atenolol (TENORMIN) 25 mg tablet Take 25 mg by mouth two (2) times a day.    Historical Provider   insulin glargine (LANTUS) 100 unit/mL injection 75 Units by SubCUTAneous route nightly.    Historical Provider   oxyCODONE-acetaminophen (PERCOCET 7.5) 7.5-325 mg per tablet Take 1 Tab by mouth two (2) times daily as needed for Pain.    Historical Provider   morphine CR (MS CONTIN) 30 mg CR tablet Take 30 mg by mouth every twelve (12) hours as needed.    Historical Provider   pravastatin (PRAVACHOL) 80 mg tablet Take 80 mg by mouth nightly.    Historical Provider   docusate sodium (COLACE) 100 mg capsule Take 1 Cap by mouth two (2) times a day. 10/13/14   Junie Bame, PA   insulin aspart (NOVOLOG) 100 unit/mL injection 30 Units by SubCUTAneous route Before breakfast, lunch, and dinner.    Historical Provider   aspirin 81 mg chewable tablet Take 81 mg by mouth daily.    Phys Other, MD   clopidogrel (PLAVIX) 75 mg tablet Take 75 mg by mouth daily.    Phys Other, MD       Labs Reviewed:    Recent Results (from the past 24 hour(s))   EKG, 12 LEAD, INITIAL    Collection Time: 10/25/14  1:00 AM   Result Value Ref Range    Ventricular Rate 79 BPM    Atrial Rate 79 BPM    P-R Interval 174 ms    QRS Duration 74 ms    Q-T Interval 384 ms    QTC Calculation (Bezet) 440 ms     Calculated P Axis 66 degrees    Calculated R Axis 19 degrees    Calculated T Axis 53 degrees    Diagnosis       Normal sinus rhythm  Nonspecific T wave abnormality  Abnormal ECG  When compared with ECG of 07-Jun-2014 21:06,  No significant change was found     CARDIAC PANEL,(CK, CKMB & TROPONIN)    Collection Time: 10/25/14  1:05 AM   Result Value Ref Range    CK 58 39 - 308 U/L    CK - MB 0.9 0.5 - 3.6 ng/ml    CK-MB Index 1.6 0.0 - 4.0 %    Troponin-I, Qt. <0.02 0.00 - 0.06 NG/ML   CBC WITH AUTOMATED DIFF    Collection Time: 10/25/14  1:05 AM   Result Value Ref Range    WBC 8.5 4.6 - 13.2 K/uL    RBC 4.50 (L) 4.70 - 5.50 M/uL    HGB 13.0 13.0 - 16.0 g/dL    HCT 84.1 66.0 - 63.0 %    MCV 85.8 74.0 - 97.0 FL    MCH 28.9 24.0 - 34.0 PG  MCHC 33.7 31.0 - 37.0 g/dL    RDW 91.412.3 78.211.6 - 95.614.5 %    PLATELET 492 (H) 135 - 420 K/uL    MPV 9.3 9.2 - 11.8 FL    NEUTROPHILS 57 40 - 73 %    LYMPHOCYTES 28 21 - 52 %    MONOCYTES 11 (H) 3 - 10 %    EOSINOPHILS 4 0 - 5 %    BASOPHILS 0 0 - 2 %    ABS. NEUTROPHILS 4.8 1.8 - 8.0 K/UL    ABS. LYMPHOCYTES 2.4 0.9 - 3.6 K/UL    ABS. MONOCYTES 1.0 0.05 - 1.2 K/UL    ABS. EOSINOPHILS 0.3 0.0 - 0.4 K/UL    ABS. BASOPHILS 0.0 0.0 - 0.06 K/UL    DF AUTOMATED     METABOLIC PANEL, BASIC    Collection Time: 10/25/14  1:05 AM   Result Value Ref Range    Sodium 134 (L) 136 - 145 mmol/L    Potassium 3.8 3.5 - 5.5 mmol/L    Chloride 99 (L) 100 - 108 mmol/L    CO2 28 21 - 32 mmol/L    Anion gap 7 3.0 - 18 mmol/L    Glucose 375 (H) 74 - 99 mg/dL    BUN 10 7.0 - 18 MG/DL    Creatinine 2.131.00 0.6 - 1.3 MG/DL    BUN/Creatinine ratio 10 (L) 12 - 20      GFR est AA >60 >60 ml/min/1.2873m2    GFR est non-AA >60 >60 ml/min/1.4073m2    Calcium 8.1 (L) 8.5 - 10.1 MG/DL   GLUCOSE, POC    Collection Time: 10/25/14  4:54 AM   Result Value Ref Range    Glucose (POC) 342 (H) 70 - 110 mg/dL       Procedures/imaging: see electronic medical records for all  procedures/Xrays and details which were not copied into this note but were reviewed prior to creation of Plan    50 minutes of critical care time spent in the direct evaluation and treatment of this high risk patient. The reason for providing this level of medical care for this critically ill patient was due a critical illness that impaired one or more vital organ systems such that there was a high probability of imminent or life threatening deterioration in the patients condition. This care involved high complexity decision making to assess, manipulate, and support vital system functions, to treat this degreee vital organ system failure and to prevent further life threatening deterioration of the patient???s condition.      CC: Phys Other, MD

## 2014-10-25 NOTE — Consults (Signed)
Consult Note    Patient: Tracy [name] Enoch MRN: [id number]  CSN: [id number]    Date of Birth: [birth year]  Age: 59 y.o.  Sex: male    DOA: 10/25/2014 LOS:  LOS: 0 days        Requesting Physician: Hospitalist  Reason for Consultation: Perrectal abscess               HPI:     Tracy [name] Wittmann is a 59 y.o. male who has been seen for perirectal abscess.  Seen and operated on about a week ago with same problem.  Uncontrolled DM.  Seen in wound clinic he says.  Wound still draining.    Past Medical History   Diagnosis Date   ??? CAD (coronary artery disease)    ??? Diabetes (HCC)    ??? MI (myocardial infarction) (HCC)    ??? Hypertension    ??? High cholesterol    ??? Chronic pain    ??? Arthritis        Past Surgical History   Procedure Laterality Date   ??? Pr cardiac surg procedure unlist     ??? Hx coronary stent placement         Family History   Problem Relation Age of Onset   ??? Diabetes Mother    ??? Heart Disease Mother    ??? Diabetes Father        History     Social History   ??? Marital Status: SINGLE     Spouse Name: N/A     Number of Children: N/A   ??? Years of Education: N/A     Social History Main Topics   ??? Smoking status: Former Smoker   ??? Smokeless tobacco: Not on file   ??? Alcohol Use: Yes   ??? Drug Use: Not on file   ??? Sexual Activity: Not on file     Other Topics Concern   ??? Not on file     Social History Narrative       Prior to Admission medications    Medication Sig Start Date End Date Taking? Authorizing Provider   oxyCODONE-acetaminophen (PERCOCET) 5-325 mg per tablet Take 1-2 Tabs by mouth every four (4) hours as needed for Pain. Max Daily Amount: 12 Tabs. 10/19/14   Frankey PootSteven Yassen Kinnett, MD   ciprofloxacin HCl (CIPRO) 250 mg tablet Take 2 Tabs by mouth every twelve (12) hours for 10 days. 10/19/14 10/29/14  Frankey PootSteven Sahaana Weitman, MD   metroNIDAZOLE (FLAGYL) 500 mg tablet Take 1 Tab by mouth three (3) times daily for 10 days. 10/19/14 10/29/14  Frankey PootSteven Eduardo Honor, MD   atenolol (TENORMIN) 25 mg tablet Take 25 mg by mouth two (2) times a day.     Historical Provider   insulin glargine (LANTUS) 100 unit/mL injection 75 Units by SubCUTAneous route nightly.    Historical Provider   oxyCODONE-acetaminophen (PERCOCET 7.5) 7.5-325 mg per tablet Take 1 Tab by mouth two (2) times daily as needed for Pain.    Historical Provider   morphine CR (MS CONTIN) 30 mg CR tablet Take 30 mg by mouth every twelve (12) hours as needed.    Historical Provider   pravastatin (PRAVACHOL) 80 mg tablet Take 80 mg by mouth nightly.    Historical Provider   docusate sodium (COLACE) 100 mg capsule Take 1 Cap by mouth two (2) times a day. 10/13/14   Junie Bameobert C Dearnley, PA   insulin aspart (NOVOLOG) 100 unit/mL injection 30 Units by SubCUTAneous route Before breakfast, lunch, and dinner.  Historical Provider   aspirin 81 mg chewable tablet Take 81 mg by mouth daily.    Phys Other, MD   clopidogrel (PLAVIX) 75 mg tablet Take 75 mg by mouth daily.    Phys Other, MD       No Known Allergies    Review of Systems  A comprehensive review of systems was negative except for that written in the History of Present Illness.      Physical Exam:      Visit Vitals   Item Reading   ??? BP 137/80 mmHg   ??? Pulse 61   ??? Temp 98.2 ??F (36.8 ??C)   ??? Resp 16   ??? Ht  (1.803 m)   ??? Wt 83.915 kg (185 lb)   ??? BMI 25.81 kg/m2   ??? SpO2 100%       Physical Exam:  Physical Exam:   General:  Alert, cooperative, no distress, appears stated age.   Eyes:  Conjunctivae/corneas clear. PERRL, EOMs intact. Fundi benign   Ears:  Normal TMs and external ear canals both ears.   Nose: Nares normal. Septum midline. Mucosa normal. No drainage or sinus tenderness.   Mouth/Throat: Lips, mucosa, and tongue normal. Teeth and gums normal.   Neck: Supple, symmetrical, trachea midline, no adenopathy, thyroid: no enlargement/tenderness/nodules, no carotid bruit and no JVD.   Back:   Symmetric, no curvature. ROM normal. No CVA tenderness.   Lungs:   Clear to auscultation bilaterally.    Heart:  Regular rate and rhythm, S1, S2 normal, no murmur, click, rub or gallop.   Abdomen:   Soft, non-tender. Bowel sounds normal. No masses,  No organomegaly.  Open perirectal wound with drainage.   Extremities: Extremities normal, atraumatic, no cyanosis or edema.   Pulses: 2+ and symmetric all extremities.   Skin: Skin color, texture, turgor normal. No rashes or lesions   Lymph nodes: Cervical, supraclavicular, and axillary nodes normal.   Neurologic: CNII-XII intact. Normal strength, sensation and reflexes throughout.       Labs Reviewed:  All lab results for the last 24 hours reviewed.    Assessment/Plan     Principal Problem:    Perirectal abscess (10/18/2014)    Active Problems:    Diabetes (HCC) (10/25/2014)      Cellulitis (10/25/2014)      Hyponatremia (10/25/2014)        Rec wound care with wound vac.  Suspect he may have a rectal fistula now if he didn't have one before.

## 2014-10-25 NOTE — Other (Signed)
Bedside and Verbal shift change report given to Blenda PealsEumeka, RN  (oncoming nurse) by Tereso Newcomer. Bowser, RN (offgoing nurse). Report included the following information SBAR and Kardex.

## 2014-10-25 NOTE — Progress Notes (Signed)
[id number] -  Assumed care of pt.  Pt alert and oriented times 4.  Pt oriented to room, room service, call bell and incentive spirometer.  Head to toe assessment completed at this time. See Doc Flowsheets.  20G Left FA IVF infusing as ordered. Pt rated pain 10/10 at this time.  SCDs applied.  Call bell within reach.  Will continue to monitor.        [id number] - Pt currently resting in bed. Pt rated pain 9/10 and medicated per MAR. Call bell and telephone within reach.  No other concerns at this time.

## 2014-10-25 NOTE — ED Notes (Signed)
Pt had an abscess removed on this past Monday and i need follow up . I am having pain from head to toe.

## 2014-10-25 NOTE — Progress Notes (Signed)
Pharmacy Dosing Services: Vancomycin    Consult for Vancomycin Dosing by Pharmacy by Dr. Eula ListenHussain  Consult provided for this 59 y.o. year old male , for indication of skin and soft tissue infection.  Day of Therapy 01    Ht Readings from Last 1 Encounters:   10/25/14 180.3 cm (71")        Wt Readings from Last 1 Encounters:   10/25/14 83.915 kg (185 lb)        Other Current Antibiotics Zosyn 3.375gm IV q6h   Significant Cultures Cultures pending   Serum Creatinine Lab Results   Component Value Date/Time    CREATININE 1.00 10/25/2014 01:05 AM      Creatinine Clearance Estimated Creatinine Clearance: 84.7 mL/min (based on Cr of 1).   BUN Lab Results   Component Value Date/Time    BUN 10 10/25/2014 01:05 AM      WBC Lab Results   Component Value Date/Time    WBC 8.5 10/25/2014 01:05 AM      H/H Lab Results   Component Value Date/Time    HGB 13.0 10/25/2014 01:05 AM      Platelets Lab Results   Component Value Date/Time    PLATELET 492 10/25/2014 01:05 AM      Temp 98.5 ??F (36.9 ??C) ()     Start Vancomycin therapy, with loading dose of 1000 (mg) at 0444 10/25/2014(time/date).  Follow with maintenance dose of 1250(mg) at 1700/ 10/25/2014 (time/date), every 12 hours (frequency).    Dose calculated to approximate a therapeutic trough of 10-315mcg/mL.      Pharmacy to follow daily and will make changes to dose and/or frequency based on clinical status.        Pharmacist Wilhelmina McardleSTEVE J COSTELLO, PHARMD

## 2014-10-25 NOTE — Progress Notes (Signed)
[id number] Assumed care of patient at this time. Patient resting in bed in NAD. Assessment completed and charted on flow sheet. Patient reports pain controlled at this time at 4/10. No express needs reported.     1028 Patient medicated for pain. No additional needs reported.     1245 Rounding with Dr. Denyse DagoAmer. Patient resting in bed in NAD at this time. Diet upgraded to DM CC per Dr. Denyse DagoAmer. Will add meal time insulin after further chart/lab review.     1551 Patient reporting discomfort in left forearm at IV site. IV infiltrated and discontinued. New IV started. Patient medicated for pain at this time.

## 2014-10-25 NOTE — Progress Notes (Signed)
Readmission Risk Assessment:     Moderate Risk and MSSP/Good Help ACO patients    RRAT Score:  13-20    Initial Assessment: Chart reviewed and noted Pt admitted for s/p pilonidal cyst removal a week ago. Pt complains of pain and wound infection, chest pain. Currently Pt is on medical unit receiving IV abx, wound clinic consult. CM following for any needs at time of dc.      Emergency Contact:  Emilia BeckCheryl Lewis friend- [phone]    Pertinent Medical Hx:     CAD, DM, MI, HTN, OA, stent placement    PCP/Specialists: [facility]      Community Services:   None    DME:      None    Moderate Risk Care Transition Plan:  1. [facility], SNF, acute rehab, community care coordination of resources.  2. Involve patient/caregiver in assessment, planning, education and implement of intervention.  3. CM daily patient care huddles/interdisciplinary rounds.  4. PCP/Specialist appointment within 5 ??? 7 days made prior to discharge.  5. Facilitate transportation and logistics for follow-up appointments.  6. Medication reconciliation ??? Pharmacy  7. Formal handoff between hospital provider and post-acute provider to transition patient  [facility] Nurse Navigator or PCP practice.

## 2014-10-25 NOTE — ED Notes (Signed)
Pt c/o pain to buttocks r/t an abscess.  Pt stated that he had surgery last Monday, and tonight pain as gotten worse.  Pt states "a lot" of discharge is coming.

## 2014-10-25 NOTE — Other (Signed)
Shift summary: pt pleasant and cooperative with care. Rec'd prn pain medication x1 effective. Pt IV site to right AC not infusing. No infiltration noted but IV pump keeps alarming downstream occlusion. A floor nurse attempted x2, unsuccessful. Request for ED tech to attempt. Pt resting in bed.

## 2014-10-25 NOTE — Other (Addendum)
TRANSFER - IN REPORT:    Verbal report received from Rose FillersSharon Peck, RN on [name]  being received from ER for routine progression of care      Report consisted of patient???s Situation, Background, Assessment and   Recommendations(SBAR).     Information from the following report(s) SBAR and Kardex was reviewed with the receiving nurse.    Opportunity for questions and clarification was provided.      Assessment completed upon patient???s arrival to unit and care assumed.

## 2014-10-26 LAB — METABOLIC PANEL, COMPREHENSIVE
A-G Ratio: 0.6 — ABNORMAL LOW (ref 0.8–1.7)
ALT (SGPT): 43 U/L (ref 16–61)
AST (SGOT): 43 U/L — ABNORMAL HIGH (ref 15–37)
Albumin: 2.3 g/dL — ABNORMAL LOW (ref 3.4–5.0)
Alk. phosphatase: 35 U/L — ABNORMAL LOW (ref 45–117)
Anion gap: 3 mmol/L (ref 3.0–18)
BUN/Creatinine ratio: 13 (ref 12–20)
BUN: 11 MG/DL (ref 7.0–18)
Bilirubin, total: 0.2 MG/DL (ref 0.2–1.0)
CO2: 31 mmol/L (ref 21–32)
Calcium: 8 MG/DL — ABNORMAL LOW (ref 8.5–10.1)
Chloride: 103 mmol/L (ref 100–108)
Creatinine: 0.86 MG/DL (ref 0.6–1.3)
GFR est AA: >60 mL/min/{1.73_m2} (ref >60)
GFR est non-AA: >60 mL/min/{1.73_m2} (ref >60)
Globulin: 4.1 g/dL — ABNORMAL HIGH (ref 2.0–4.0)
Glucose: 125 mg/dL — ABNORMAL HIGH (ref 74–99)
Potassium: 3.4 mmol/L — ABNORMAL LOW (ref 3.5–5.5)
Protein, total: 6.4 g/dL (ref 6.4–8.2)
Sodium: 137 mmol/L (ref 136–145)

## 2014-10-26 LAB — CBC WITH AUTOMATED DIFF
ABS. BASOPHILS: 0 10*3/uL (ref 0.0–0.06)
ABS. EOSINOPHILS: 0.4 10*3/uL (ref 0.0–0.4)
ABS. LYMPHOCYTES: 2.8 10*3/uL (ref 0.9–3.6)
ABS. MONOCYTES: 1 10*3/uL (ref 0.05–1.2)
ABS. NEUTROPHILS: 4.7 10*3/uL (ref 1.8–8.0)
BASOPHILS: 0 % (ref 0–2)
EOSINOPHILS: 5 % (ref 0–5)
HCT: 35.3 % — ABNORMAL LOW (ref 36.0–48.0)
HGB: 11.6 g/dL — ABNORMAL LOW (ref 13.0–16.0)
LYMPHOCYTES: 32 % (ref 21–52)
MCH: 28.6 PG (ref 24.0–34.0)
MCHC: 32.9 g/dL (ref 31.0–37.0)
MCV: 87.2 FL (ref 74.0–97.0)
MONOCYTES: 11 % — ABNORMAL HIGH (ref 3–10)
MPV: 9.1 FL — ABNORMAL LOW (ref 9.2–11.8)
NEUTROPHILS: 52 % (ref 40–73)
PLATELET: 462 10*3/uL — ABNORMAL HIGH (ref 135–420)
RBC: 4.05 M/uL — ABNORMAL LOW (ref 4.70–5.50)
RDW: 12.3 % (ref 11.6–14.5)
WBC: 9 10*3/uL (ref 4.6–13.2)

## 2014-10-26 LAB — GLUCOSE, POC
Glucose (POC): 252 mg/dL — ABNORMAL HIGH (ref 70–110)
Glucose (POC): 107 mg/dL (ref 70–110)
Glucose (POC): 110 mg/dL (ref 70–110)
Glucose (POC): 110 mg/dL (ref 70–110)

## 2014-10-26 MED ORDER — AMLODIPINE 5 MG TAB
5 mg | Freq: Every day | ORAL | Status: AC
Start: 2014-10-26 — End: 2014-10-26
  Administered 2014-10-26: 13:00:00 via ORAL

## 2014-10-26 MED ORDER — PHARMACY INFORMATION NOTE
Freq: Once | Status: AC
Start: 2014-10-26 — End: 2014-10-27
  Administered 2014-10-27: 10:00:00

## 2014-10-26 MED ORDER — INSULIN LISPRO 100 UNIT/ML INJECTION
100 unit/mL | Freq: Three times a day (TID) | SUBCUTANEOUS | Status: DC
Start: 2014-10-26 — End: 2014-10-28
  Administered 2014-10-26 – 2014-10-28 (×5): via SUBCUTANEOUS

## 2014-10-26 MED ORDER — INSULIN LISPRO 100 UNIT/ML INJECTION
100 unit/mL | Freq: Four times a day (QID) | SUBCUTANEOUS | Status: DC
Start: 2014-10-26 — End: 2014-10-29
  Administered 2014-10-26 – 2014-10-28 (×3): via SUBCUTANEOUS

## 2014-10-26 MED ORDER — HYDROMORPHONE 2 MG/ML INJECTION SOLUTION
2 mg/mL | INTRAMUSCULAR | Status: DC | PRN
Start: 2014-10-26 — End: 2014-10-29
  Administered 2014-10-26 – 2014-10-29 (×11): via INTRAVENOUS

## 2014-10-26 MED ORDER — POTASSIUM CHLORIDE SR 20 MEQ TAB, PARTICLES/CRYSTALS
20 mEq | Freq: Every day | ORAL | Status: DC
Start: 2014-10-26 — End: 2014-10-29
  Administered 2014-10-26 – 2014-10-29 (×4): via ORAL

## 2014-10-26 MED FILL — DOCUSATE SODIUM 100 MG CAP: 100 mg | ORAL | Qty: 1

## 2014-10-26 MED FILL — HUMALOG U-100 INSULIN 100 UNIT/ML SUBCUTANEOUS SOLUTION: 100 unit/mL | SUBCUTANEOUS | Qty: 3

## 2014-10-26 MED FILL — INSULIN GLARGINE 100 UNIT/ML INJECTION: 100 unit/mL | SUBCUTANEOUS | Qty: 1

## 2014-10-26 MED FILL — ATENOLOL 25 MG TAB: 25 mg | ORAL | Qty: 1

## 2014-10-26 MED FILL — HYDROMORPHONE 2 MG/ML INJECTION SOLUTION: 2 mg/mL | INTRAMUSCULAR | Qty: 1

## 2014-10-26 MED FILL — OXYCODONE-ACETAMINOPHEN 5 MG-325 MG TAB: 5-325 mg | ORAL | Qty: 2

## 2014-10-26 MED FILL — PRAVASTATIN 20 MG TAB: 20 mg | ORAL | Qty: 4

## 2014-10-26 MED FILL — SODIUM CHLORIDE 0.9 % IV PIGGY BACK: INTRAVENOUS | Qty: 100

## 2014-10-26 MED FILL — VANCOMYCIN 10 GRAM IV SOLR: 10 gram | INTRAVENOUS | Qty: 1250

## 2014-10-26 MED FILL — KLOR-CON M20 MEQ TABLET,EXTENDED RELEASE: 20 mEq | ORAL | Qty: 1

## 2014-10-26 MED FILL — ASPIRIN 81 MG CHEWABLE TAB: 81 mg | ORAL | Qty: 1

## 2014-10-26 MED FILL — PHARMACY INFORMATION NOTE: Qty: 1

## 2014-10-26 MED FILL — AMLODIPINE 5 MG TAB: 5 mg | ORAL | Qty: 1

## 2014-10-26 NOTE — Progress Notes (Signed)
Shift Summary :  Oriented without problems. Rectal abscess with what seems to be moderate amount of  pink to to red/brown drainage. Pain controlled with pain medication.

## 2014-10-26 NOTE — Progress Notes (Signed)
No changes  Will see if he can get a wound vac.  Plan on return to OR tomorrow for debridement.

## 2014-10-26 NOTE — Other (Signed)
Bedside and Verbal shift change report given to G. Bugno RN  (oncoming nurse) by  A. Banks RN  (offgoing nurse). Report included the following information SBAR, Kardex, Recent Results and Med Rec Status.

## 2014-10-26 NOTE — Other (Signed)
GLYCEMIC CONTROL AND NUTRITION    Assessment/Recommendations:  **PLEASE have pt check his own BG and administer his own insulin during the remainder of his stay  -   Most recent blood glucose values:   Lab Results   Component Value Date/Time    GLU 125* 10/26/2014 05:37 AM    GLUCPOC 110 10/26/2014 11:21 AM    GLUCPOC 107 10/26/2014 07:37 AM    GLUCPOC 252* 10/25/2014 09:16 PM         Current A1C is equivalent to average blood glucose of 297 mg/dl over the past 2-3 months.    Lab Results   Component Value Date/Time    HEMOGLOBIN A1C 11.9 10/18/2014 01:28 PM       Current hospital diabetes medications:   - Lantus 40 units qhs  - Humalog 15 units qac  - Humalog Very Insulin Resistant Corrective Coverage    Previous day's insulin requirements:   - 71 units total (40 Lantus, 21 Humalog, 10 Regular)    Home diabetes medications:  - Lantus 75 units qhs  - Novalog 30 units QAC    Anthropometrics:   Body mass index is 25.81 kg/(m^2).  Last 3 Recorded Weights in this Encounter    10/25/14 0050   Weight: 83.915 kg (185 lb)    Ht Readings from Last 1 Encounters:   10/25/14 5\' 11"  (1.803 m)       Diet:    Active Orders   Diet    DIET DIABETIC WITH OPTIONS Consistent Carb 2200kcal; Regular; 2 GM NA (House Low NA)       Intake:   Patient Vitals for the past 100 hrs:   % Diet Eaten   10/26/14 0900 100 %   10/25/14 1552 100 %         M. Remonia Richteratherine Brown, RD

## 2014-10-26 NOTE — Progress Notes (Signed)
Hospitalist Progress Note    Patient: Tracy JohnsDarius [name] MRN: [id number]  CSN: [id number]    Date of Birth: 1956/03/07  Age: 59 y.o.  Sex: male    DOA: 10/25/2014 LOS:  LOS: 1 day            Assessment/Plan   1.  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 Polymicrobial growth w MSSA,  2. DM2 uncontrolled a1c 11.9, suspect medication noncompliance, improving currently  3. Hypokalemia  4. HTN controlled  5. CAD    Plan:  - decrease fluids  - contineu lantus 40 qhs, add mealtime 15 units lispro  - continue current antibiotics  - dw Dr Laurance FlattenHopson, plans for further debridement in OR tomorrow and wound vac      Patient Active Problem List   Diagnosis Code   ??? Drug overdose T50.901A   ??? Chest pain R07.9   ??? Perirectal abscess K61.1   ??? Surgery, other elective Z41.9   ??? Uncontrolled type II diabetes mellitus (HCC) E11.65   ??? Diabetes (HCC) E11.9   ??? Cellulitis L03.90   ??? Hyponatremia E87.1               Subjective:    cc: rectal pain  Pt complains of rectal pain 7/10, worse w direct pressure on area, better somewhat w percocet. No radiation      REVIEW OF SYSTEMS:  General: No fevers or chills.  Cardiovascular: No chest pain or pressure. No palpitations.   Pulmonary: No shortness of breath.   Gastrointestinal: No nausea, vomiting.     Objective:        Vital signs/Intake and Output:  Visit Vitals   Item Reading   ??? BP 142/78 mmHg   ??? Pulse 54   ??? Temp 97.6 ??F (36.4 ??C)   ??? Resp 12   ??? Ht 5\' 11"  (1.803 m)   ??? Wt 83.915 kg (185 lb)   ??? BMI 25.81 kg/m2   ??? SpO2 100%     Current Shift:     Last three shifts:  01/03 1900 - 01/05 0659  In: 1930 [P.O.:740; I.V.:1190]  Out: 1245 [Urine:1245]    Body mass index is 25.81 kg/(m^2).    Physical Exam:  GEN: Alert and oriented times three NAD  EYES: conjunctiva normal, lids with out lesions  HEENT: MMM, No thyromegaly, no lymphadenopathy  HEART: RRR +S1 +S2, no JVD, pulses 2+ distally  LUNGS: CTA B/L no wheezes, rales or rhonchi   ABDOMEN: + BS, soft NT/ND no organomegaly,  No rebound  EXTREMITIES: No edema cyanosis, cap refill normal   SKIN: no rashes or skin breakdown, no nodules, normal turgor  Current Facility-Administered Medications   Medication Dose Route Frequency   ??? HYDROmorphone (DILAUDID) injection 0.5-1 mg  0.5-1 mg IntraVENous Q4H PRN   ??? insulin lispro (HUMALOG) injection 15 Units  15 Units SubCUTAneous TIDAC   ??? sodium chloride (NS) flush 5-10 mL  5-10 mL IntraVENous Q8H   ??? sodium chloride (NS) flush 5-10 mL  5-10 mL IntraVENous PRN   ??? 0.9% sodium chloride infusion  75 mL/hr IntraVENous CONTINUOUS   ??? morphine injection 4 mg  4 mg IntraVENous Q4H PRN   ??? atenolol (TENORMIN) tablet 25 mg  25 mg Oral BID   ??? insulin glargine (LANTUS) injection 40 Units  40 Units SubCUTAneous QHS   ??? oxyCODONE-acetaminophen (PERCOCET) 5-325 mg per tablet 2 Tab  2 Tab Oral Q4H PRN   ??? pravastatin (PRAVACHOL)  tablet 80 mg  80 mg Oral QHS   ??? docusate sodium (COLACE) capsule 100 mg  100 mg Oral BID   ??? aspirin chewable tablet 81 mg  81 mg Oral DAILY   ??? diphenhydrAMINE (BENADRYL) injection 12.5 mg  12.5 mg IntraVENous Q4H PRN   ??? ondansetron (ZOFRAN) injection 4 mg  4 mg IntraVENous Q4H PRN   ??? zolpidem (AMBIEN) tablet 5 mg  5 mg Oral QHS PRN   ??? piperacillin-tazobactam (ZOSYN) 3.375 g in 0.9% sodium chloride (MBP/ADV) 100 mL ADV  3.375 g IntraVENous Q6H   ??? glucose chewable tablet 16 g  16 g Oral PRN   ??? glucagon (GLUCAGEN) injection 1 mg  1 mg IntraMUSCular PRN   ??? dextrose (D50W) injection syrg 12.5-25 g  25-50 mL IntraVENous PRN   ??? vancomycin (VANCOCIN) 1,250 mg in 0.9% sodium chloride 250 mL IVPB  1,250 mg IntraVENous Q12H   ??? Vancomycin- pharmacy to dose  1 Each Other Rx Dosing/Monitoring   ??? insulin lispro (HUMALOG) injection   SubCUTAneous AC&HS         All the patient's labs over the past 24 hours were reviewed both during my initial daily workflow process and at the time notated as "note time" in  [facility].  (It is not time stamped separately in this workflow.)  Select labs are listed below.        Labs: Results:       Chemistry Recent Labs      10/26/14   0537  10/25/14   0105   GLU  125*  375*   NA  137  134*   K  3.4*  3.8   CL  103  99*   CO2  31  28   BUN  11  10   CREA  0.86  1.00   CA  8.0*  8.1*   AGAP  3  7   BUCR  13  10*   AP  35*   --    TP  6.4   --    ALB  2.3*   --    GLOB  4.1*   --    AGRAT  0.6*   --       CBC w/Diff Recent Labs      10/26/14   0537  10/25/14   0105   WBC  9.0  8.5   RBC  4.05*  4.50*   HGB  11.6*  13.0   HCT  35.3*  38.6   PLT  462*  492*   GRANS  52  57   LYMPH  32  28   EOS  5  4      Cardiac Enzymes Recent Labs      10/25/14   0105   CPK  58   CKND1  1.6                  Liver Enzymes Recent Labs      10/26/14   0537   TP  6.4   ALB  2.3*   AP  35*   SGOT  43*          Procedures/imaging: see electronic medical records for all procedures/Xrays and details which were not copied into this note but were reviewed prior to creation of Plan      Esperanza Richters, DO  Internal Medicine/Geriatrics

## 2014-10-26 NOTE — Wound Image (Signed)
Wound care in for wound vac placement. Tracy [name] (clinic) spoke with Dr Laurance FlattenHopson. Patient scheduled for surgical debridement last afternoon/evening wed 1/6. Wound care can be placed on Thurs 1/7 if staff unable to attend OR on 1/6. Dressing intact at this time.

## 2014-10-26 NOTE — Other (Signed)
Bedside and Verbal shift change report given to Malissa HippoPolly Fulwider RN (oncoming nurse) by Jose PersiaGeraldine H Euline Kimbler, RN   (offgoing nurse). Report included the following information SBAR, Kardex and MAR.   Visit Vitals   Item Reading   ??? BP 137/67 mmHg   ??? Pulse 62   ??? Temp 99 ??F (37.2 ??C)   ??? Resp 16   ??? Ht 5\' 11"  (1.803 m)   ??? Wt 83.915 kg (185 lb)   ??? BMI 25.81 kg/m2   ??? SpO2 99%       Date 10/25/14 1900 - 10/26/14 0659 10/26/14 0700 - 10/27/14 0659   Shift 1900-0659 24 Hour Total 0700-1859 1900-0659 24 Hour Total   I  N  T  A  K  E   P.O. 120 740 720 250 970      P.O. 120 740 720 250 970    I.V.  (mL/kg/hr) 1090 1190         I.V.  100         Volume (0.9% sodium chloride infusion) 740 740         Volume (piperacillin-tazobactam (ZOSYN) 3.375 g in 0.9% sodium chloride (MBP/ADV) 100 mL ADV) 100 100         Volume (vancomycin (VANCOCIN) 1,250 mg in 0.9% sodium chloride 250 mL IVPB) 250 250       Shift Total  (mL/kg) 1210  (14.4) 1930  (23) 720  (8.6) 250  (3) 970  (11.6)   O  U  T  P  U  T   Urine  (mL/kg/hr) 755 1245 1290  (1.3) 1000 2290      Urine Voided 755 1245 1290 1000 2290      Urine Occurrence(s)  1 x       Shift Total  (mL/kg) 755  (9) 1245  (14.8) 1290  (15.4) 1000  (11.9) 2290  (27.3)   NET 455 685 -570 -750 -1320   Weight (kg) 83.9 83.9 83.9 83.9 83.9

## 2014-10-26 NOTE — Progress Notes (Addendum)
0737-Introduced self to pt. Pt awake, alert, lying in bed. Pt reports a 8/10 pain in rectum. Respirations are even and unlabored. Pt denies any chest pain or SOB. Dr. Waneta MartinsAlmeida and Dr. Laurance FlattenHopson in room rounding on pt. All questions and concerns addressed and answered. Pt pain medication changed. No s/s of acute distress. Call bell within reach.     Shift Summary-uneventful shift. Pt's peri pad to rectum draining pink and rusty drainage. Pt reported pain throughout shift. Pain well managed with MAR medication. Potential surgery tomorrow for debridement.Vital signs stable.

## 2014-10-27 LAB — METABOLIC PANEL, COMPREHENSIVE
A-G Ratio: 0.5 — ABNORMAL LOW (ref 0.8–1.7)
ALT (SGPT): 39 U/L (ref 16–61)
AST (SGOT): 39 U/L — ABNORMAL HIGH (ref 15–37)
Albumin: 2.2 g/dL — ABNORMAL LOW (ref 3.4–5.0)
Alk. phosphatase: 32 U/L — ABNORMAL LOW (ref 45–117)
Anion gap: 7 mmol/L (ref 3.0–18)
BUN/Creatinine ratio: 10 — ABNORMAL LOW (ref 12–20)
BUN: 8 MG/DL (ref 7.0–18)
Bilirubin, total: 0.2 MG/DL (ref 0.2–1.0)
CO2: 29 mmol/L (ref 21–32)
Calcium: 8.4 MG/DL — ABNORMAL LOW (ref 8.5–10.1)
Chloride: 105 mmol/L (ref 100–108)
Creatinine: 0.78 MG/DL (ref 0.6–1.3)
GFR est AA: >60 mL/min/{1.73_m2} (ref >60)
GFR est non-AA: >60 mL/min/{1.73_m2} (ref >60)
Globulin: 4.1 g/dL — ABNORMAL HIGH (ref 2.0–4.0)
Glucose: 114 mg/dL — ABNORMAL HIGH (ref 74–99)
Potassium: 3.8 mmol/L (ref 3.5–5.5)
Protein, total: 6.3 g/dL — ABNORMAL LOW (ref 6.4–8.2)
Sodium: 141 mmol/L (ref 136–145)

## 2014-10-27 LAB — GLUCOSE, POC
Glucose (POC): 81 mg/dL (ref 70–110)
Glucose (POC): 91 mg/dL (ref 70–110)
Glucose (POC): 91 mg/dL (ref 70–110)
Glucose (POC): 74 mg/dL (ref 70–110)
Glucose (POC): 67 mg/dL — ABNORMAL LOW (ref 70–110)
Glucose (POC): 65 mg/dL — ABNORMAL LOW (ref 70–110)
Glucose (POC): 76 mg/dL (ref 70–110)

## 2014-10-27 LAB — CBC WITH AUTOMATED DIFF
ABS. BASOPHILS: 0 10*3/uL (ref 0.0–0.06)
ABS. EOSINOPHILS: 0.4 10*3/uL (ref 0.0–0.4)
ABS. LYMPHOCYTES: 2.8 10*3/uL (ref 0.9–3.6)
ABS. MONOCYTES: 0.9 10*3/uL (ref 0.05–1.2)
ABS. NEUTROPHILS: 4 10*3/uL (ref 1.8–8.0)
BASOPHILS: 0 % (ref 0–2)
EOSINOPHILS: 4 % (ref 0–5)
HCT: 35.7 % — ABNORMAL LOW (ref 36.0–48.0)
HGB: 11.6 g/dL — ABNORMAL LOW (ref 13.0–16.0)
LYMPHOCYTES: 35 % (ref 21–52)
MCH: 28.3 PG (ref 24.0–34.0)
MCHC: 32.5 g/dL (ref 31.0–37.0)
MCV: 87.1 FL (ref 74.0–97.0)
MONOCYTES: 11 % — ABNORMAL HIGH (ref 3–10)
MPV: 9 FL — ABNORMAL LOW (ref 9.2–11.8)
NEUTROPHILS: 50 % (ref 40–73)
PLATELET: 443 10*3/uL — ABNORMAL HIGH (ref 135–420)
RBC: 4.1 M/uL — ABNORMAL LOW (ref 4.70–5.50)
RDW: 12.4 % (ref 11.6–14.5)
WBC: 8.1 10*3/uL (ref 4.6–13.2)

## 2014-10-27 LAB — VANCOMYCIN, TROUGH
Reported dose date: 20160105
Reported dose time:: 1700
Reported dose:: 1250 UNITS
Vancomycin,trough: 7.8 ug/mL — ABNORMAL LOW (ref 10.0–20.0)

## 2014-10-27 MED ORDER — ALBUTEROL SULFATE 0.083 % (0.83 MG/ML) SOLN FOR INHALATION
2.5 mg /3 mL (0.083 %) | RESPIRATORY_TRACT | Status: DC | PRN
Start: 2014-10-27 — End: 2014-10-27

## 2014-10-27 MED ORDER — PROPOFOL 10 MG/ML IV EMUL
10 mg/mL | INTRAVENOUS | Status: DC | PRN
Start: 2014-10-27 — End: 2014-10-27
  Administered 2014-10-27: 20:00:00 via INTRAVENOUS

## 2014-10-27 MED ORDER — FENTANYL CITRATE (PF) 50 MCG/ML IJ SOLN
50 mcg/mL | INTRAMUSCULAR | Status: DC | PRN
Start: 2014-10-27 — End: 2014-10-27
  Administered 2014-10-27 (×2): via INTRAVENOUS

## 2014-10-27 MED ORDER — LACTATED RINGERS IV
INTRAVENOUS | Status: DC
Start: 2014-10-27 — End: 2014-10-27
  Administered 2014-10-27: 19:00:00 via INTRAVENOUS

## 2014-10-27 MED ORDER — ONDANSETRON (PF) 4 MG/2 ML INJECTION
4 mg/2 mL | INTRAMUSCULAR | Status: DC | PRN
Start: 2014-10-27 — End: 2014-10-27
  Administered 2014-10-27: 20:00:00 via INTRAVENOUS

## 2014-10-27 MED ORDER — LIDOCAINE HCL 1 % (10 MG/ML) IJ SOLN
10 mg/mL (1 %) | INTRAMUSCULAR | Status: AC
Start: 2014-10-27 — End: ?

## 2014-10-27 MED ORDER — DIPHENHYDRAMINE HCL 50 MG/ML IJ SOLN
50 mg/mL | INTRAMUSCULAR | Status: DC | PRN
Start: 2014-10-27 — End: 2014-10-27

## 2014-10-27 MED ORDER — BUPIVACAINE (PF) 0.25 % (2.5 MG/ML) IJ SOLN
0.25 % (2.5 mg/mL) | INTRAMUSCULAR | Status: AC
Start: 2014-10-27 — End: ?

## 2014-10-27 MED ORDER — LIDOCAINE (PF) 20 MG/ML (2 %) IJ SOLN
20 mg/mL (2 %) | INTRAMUSCULAR | Status: DC | PRN
Start: 2014-10-27 — End: 2014-10-27
  Administered 2014-10-27: 20:00:00 via INTRAVENOUS

## 2014-10-27 MED ORDER — HYDROMORPHONE 2 MG/ML INJECTION SOLUTION
2 mg/mL | INTRAMUSCULAR | Status: DC | PRN
Start: 2014-10-27 — End: 2014-10-27
  Administered 2014-10-27 (×4): via INTRAVENOUS

## 2014-10-27 MED ORDER — LACTATED RINGERS IV
INTRAVENOUS | Status: DC
Start: 2014-10-27 — End: 2014-10-27
  Administered 2014-10-27 (×2): via INTRAVENOUS

## 2014-10-27 MED ORDER — NALOXONE 0.4 MG/ML INJECTION
0.4 mg/mL | INTRAMUSCULAR | Status: DC | PRN
Start: 2014-10-27 — End: 2014-10-27

## 2014-10-27 MED ORDER — VANCOMYCIN 1,000 MG IV SOLR
1000 mg | Freq: Three times a day (TID) | INTRAVENOUS | Status: DC
Start: 2014-10-27 — End: 2014-10-29
  Administered 2014-10-27 – 2014-10-29 (×7): via INTRAVENOUS

## 2014-10-27 MED ORDER — MIDAZOLAM 1 MG/ML IJ SOLN
1 mg/mL | INTRAMUSCULAR | Status: DC | PRN
Start: 2014-10-27 — End: 2014-10-27
  Administered 2014-10-27: 20:00:00 via INTRAVENOUS

## 2014-10-27 MED ORDER — INSULIN LISPRO 100 UNIT/ML INJECTION
100 unit/mL | Freq: Once | SUBCUTANEOUS | Status: DC
Start: 2014-10-27 — End: 2014-10-27

## 2014-10-27 MED ORDER — GLYCOPYRROLATE 0.2 MG/ML IJ SOLN
0.2 mg/mL | Freq: Once | INTRAMUSCULAR | Status: AC
Start: 2014-10-27 — End: 2014-10-27
  Administered 2014-10-27: 22:00:00 via INTRAVENOUS

## 2014-10-27 MED ORDER — SODIUM CHLORIDE 0.9 % IJ SYRG
INTRAMUSCULAR | Status: DC | PRN
Start: 2014-10-27 — End: 2014-10-27

## 2014-10-27 MED ORDER — MIDAZOLAM 1 MG/ML IJ SOLN
1 mg/mL | INTRAMUSCULAR | Status: AC
Start: 2014-10-27 — End: ?

## 2014-10-27 MED ORDER — GELATIN ADSORBABLE 100 TOPICAL SPONGE
100 | CUTANEOUS | Status: AC
Start: 2014-10-27 — End: ?

## 2014-10-27 MED ORDER — BUPIVACAINE (PF) 0.25 % (2.5 MG/ML) IJ SOLN
0.252.5 % (2.5 mg/mL) | INTRAMUSCULAR | Status: DC | PRN
Start: 2014-10-27 — End: 2014-10-27
  Administered 2014-10-27: 20:00:00 via INTRAMUSCULAR

## 2014-10-27 MED ORDER — HYDROCODONE-ACETAMINOPHEN 5 MG-325 MG TAB
5-325 mg | ORAL | Status: DC | PRN
Start: 2014-10-27 — End: 2014-10-27

## 2014-10-27 MED ORDER — FENTANYL CITRATE (PF) 50 MCG/ML IJ SOLN
50 mcg/mL | INTRAMUSCULAR | Status: AC
Start: 2014-10-27 — End: ?

## 2014-10-27 MED FILL — KLOR-CON M20 MEQ TABLET,EXTENDED RELEASE: 20 mEq | ORAL | Qty: 1

## 2014-10-27 MED FILL — FENTANYL CITRATE (PF) 50 MCG/ML IJ SOLN: 50 mcg/mL | INTRAMUSCULAR | Qty: 2

## 2014-10-27 MED FILL — LACTATED RINGERS IV: INTRAVENOUS | Qty: 1000

## 2014-10-27 MED FILL — HYDROMORPHONE 2 MG/ML INJECTION SOLUTION: 2 mg/mL | INTRAMUSCULAR | Qty: 1

## 2014-10-27 MED FILL — ASPIRIN 81 MG CHEWABLE TAB: 81 mg | ORAL | Qty: 1

## 2014-10-27 MED FILL — OXYCODONE-ACETAMINOPHEN 5 MG-325 MG TAB: 5-325 mg | ORAL | Qty: 2

## 2014-10-27 MED FILL — GLYCOPYRROLATE 0.2 MG/ML IJ SOLN: 0.2 mg/mL | INTRAMUSCULAR | Qty: 2

## 2014-10-27 MED FILL — MIDAZOLAM 1 MG/ML IJ SOLN: 1 mg/mL | INTRAMUSCULAR | Qty: 2

## 2014-10-27 MED FILL — ATENOLOL 25 MG TAB: 25 mg | ORAL | Qty: 1

## 2014-10-27 MED FILL — LIDOCAINE HCL 1 % (10 MG/ML) IJ SOLN: 10 mg/mL (1 %) | INTRAMUSCULAR | Qty: 20

## 2014-10-27 MED FILL — SENSORCAINE-MPF 0.25 % (2.5 MG/ML) INJECTION SOLUTION: 0.25 % (2.5 mg/mL) | INTRAMUSCULAR | Qty: 30

## 2014-10-27 MED FILL — GELFOAM SPONGE SIZE 100 100: 100 | CUTANEOUS | Qty: 1

## 2014-10-27 MED FILL — DEX4 GLUCOSE 4 GRAM CHEWABLE TABLET: 4 gram | ORAL | Qty: 4

## 2014-10-27 MED FILL — SODIUM CHLORIDE 0.9 % IV PIGGY BACK: INTRAVENOUS | Qty: 250

## 2014-10-27 MED FILL — SODIUM CHLORIDE 0.9 % IV PIGGY BACK: INTRAVENOUS | Qty: 100

## 2014-10-27 MED FILL — PRAVASTATIN 20 MG TAB: 20 mg | ORAL | Qty: 4

## 2014-10-27 MED FILL — DOCUSATE SODIUM 100 MG CAP: 100 mg | ORAL | Qty: 1

## 2014-10-27 MED FILL — INSULIN GLARGINE 100 UNIT/ML INJECTION: 100 unit/mL | SUBCUTANEOUS | Qty: 1

## 2014-10-27 MED FILL — BD POSIFLUSH NORMAL SALINE 0.9 % INJECTION SYRINGE: INTRAMUSCULAR | Qty: 10

## 2014-10-27 NOTE — Progress Notes (Signed)
NUTRITION ASSESSMENT     RECOMMENDATIONS/PLAN:   ?? Meals/Snacks: General/healthful dietContinue on consistent carb cho;2gm                        Continue current diet  NUTRITION ASSESSMENT:   Chart Reviewed; pt triggered BPA/MST for wt loss and poor appetite.  Pt admitted with dx drug overdose,chest pain, perriectal abscess, uncontrolled DM, cellulitis, hyponatremia, periectal abscess, s/p surgery with wound infection, hyponatremia, intractable pain. Pt currently receives consistent CHO 2200 kcal;2 gm Na with excellent PO intake.  Pt seen at beside, pt reports to have wt loss of 100 lbs within a year due to medications causing decrease of appetite. Showing severe wt loss of >57.1%, not consistent with wts noted on medical records in the past year. However, per medical record pt has recent significant wt loss>11.4%<30days.  Writer encouraged to continue with good PO intakes. Recommend to continue consistent CHO;2gm Na to maintain optimal BG and Na levels.  Will continue to monitor per protocol.            Diet Order:  Consistent carb;2Gm 2200Kcal  Food Allergies:   NKFA      Other  Cultural, Religious, OR Ethnic Dietary Needs:  None Identified  Average Meal Intake:  75-100%  Documented PO Intake (Per I/O):  Patient Vitals for the past 72 hrs:   % Diet Eaten   10/26/14 2257 100 %   10/26/14 1346 100 %   10/26/14 0900 100 %   10/25/14 1552 100 %       Skin:   Intact      Surgical Incision      Pressure Ulcer      Other( wound vac for skin debriment noted; no pressure ulcer) noted  (Pertinent Medications:   Reviewed;Colace, Lantus, Humalog,   Pertinent Labs:   Reviewed;A1C 11.9    Anthropometrics:   Current Height: 5' 11"  (180.3 cm)     Current Weight: 79.788 kg (175 lb 14.4 oz)     BMI: Body mass index is 24.54 kg/(m^2). (Normal)    IBW:   ( %)     Weight Metrics 10/26/2014 10/18/2014 10/13/2014 06/08/2014 08/28/2013 06/13/2013 01/14/2013   Weight 175 lb 14.4 oz 185 lb 195 lb 170 lb 172 lb 12.8 oz 175 lb 178 lb 9.2 oz    BMI 24.54 kg/m2 25.81 kg/m2 27.21 kg/m2 23.72 kg/m2 24.11 kg/m2 24.42 kg/m2 24.92 kg/m2       Estimated Nutrition Needs:  Calories (kcal): 2128 Kcals/day (Based on: [place] (PAL 1.2-1.3 [phone] kcal/day)))  Protein (g): 96 g (1.0-1.2gm/kg (80-96gm/day))  Carbohydrates (g): at least 130 g/d    Fluid (ml): 2128 ml (32m/kcal)    Weight Used:  Actual wt    Pt expected to meet estimated nutrient needs through next review:   Yes      No; Po intake >75% of meals/snacks     NUTRITION DIAGNOSES:   Excessive carbohydrate intake related to uncontrolled DM as evidenced by A1C 11.9%      related to   as evidenced by      NUTRITION INTERVENTIONS:   ?? Meals/Snacks: General/healthful diet                           GOALS:   PO intake >75% of meals/snack during LOS    LEARNING NEEDS (Diet, Supplementation, Food/Nutrient-Drug Interaction):    None Identified   Identified and education provided/documented (  refer to Education section of EMR)   Identified and patient:   Declined      Was not appropriate/indicated    NUTRITION MONITORING /EVALUATION:   Food/Nutrient Intake Outcomes: Total energy intake  ?? Physical Signs/Symptoms Outcomes: Weight/weight change       NUTRITION RISK:    High                          Moderate                           Minimal/Uncompromised    Previous Recommendations Implemented:  N/A  Previous Goals Met:  N/A     Participated in [place] Reviewed/Documented (Refer to Careplan section of EMR)   Discharge Nutrition Plan:  Continue on consistent CHO;2gm na with PO intakes>75% of meal/snacks provided.     REASON FOR ASSESSMENT:       MD Consult:    General Nutrition Management and Supplements   Management of Tube Feeding   Calorie Count    Diet Education    RN Referral:     MST score >/=2  Malnutrition Screening Tool (MST):  Recently Lost Weight Without Trying: Yes  If Yes, How Much Weight Loss: >33 lbs     MST Score: 4      Enteral/Parenteral Nutrition PTA    Pregnant (Not in Labor):   Gestational DM      Multigestation    Pressure Ulcer/Wound Care needs   Positive Nutrition Screen:   BMI <18.5   NPO/Clear Liquid Diet > 5 days (>3 days in ICU)   New Order for TPN   LOS   Other    [place], Registry Eligible  [phone]

## 2014-10-27 NOTE — Other (Signed)
TRANSFER - OUT REPORT:    Verbal report given to Eumeka RN(name) on Senan Sarate  being transferred to 3b(unit) for routine post - op       Report consisted of patient???s Situation, Background, Assessment and   Recommendations(SBAR).     Information from the following report(s) SBAR, OR Summary, Intake/Output and MAR was reviewed with the receiving nurse.    Lines:   Peripheral IV 10/27/14 (Active)   Site Assessment Clean, dry, & intact 10/27/2014  1:42 PM   Phlebitis Assessment 0 10/27/2014  1:42 PM   Infiltration Assessment 0 10/27/2014  1:42 PM   Dressing Status Clean, dry, & intact 10/27/2014  1:42 PM   Dressing Type Tape;Transparent 10/27/2014  1:42 PM   Hub Color/Line Status Infusing 10/27/2014  1:42 PM        Opportunity for questions and clarification was provided.      Patient transported with:   O2 @ 2 liters  Registered Nurse  [employer]

## 2014-10-27 NOTE — Progress Notes (Signed)
TRANSFER - IN REPORT:    Verbal report received from H. Pete GlatterNielson, RN(name) on Toll BrothersDarius Rattan  being received from PACU (unit) for routine progression of care      Report consisted of patient???s Situation, Background, Assessment and   Recommendations(SBAR).     Information from the following report(s) OR Summary, Procedure Summary and MAR was reviewed with the receiving nurse.    Opportunity for questions and clarification was provided.      Assessment completed upon patient???s arrival to unit and care assumed.

## 2014-10-27 NOTE — Other (Signed)
Entered chart to help primary nurse clarify order.

## 2014-10-27 NOTE — Anesthesia Pre-Procedure Evaluation (Signed)
Anesthetic History   No history of anesthetic complications            Review of Systems / Medical History  Patient summary reviewed, nursing notes reviewed and pertinent labs reviewed    Pulmonary  Within defined limits                 Neuro/Psych   Within defined limits           Cardiovascular    Hypertension          CAD         GI/Hepatic/Renal  Within defined limits              Endo/Other    Diabetes    Arthritis     Other Findings              Physical Exam    Airway  Mallampati: II  TM Distance: 4 - 6 cm  Neck ROM: normal range of motion   Mouth opening: Normal     Cardiovascular  Regular rate and rhythm,  S1 and S2 normal,  no murmur, click, rub, or gallop             Dental  No notable dental hx       Pulmonary  Breath sounds clear to auscultation               Abdominal  GI exam deferred       Other Findings            Anesthetic Plan    ASA: 2  Anesthesia type: general          Induction: Intravenous  Anesthetic plan and risks discussed with: Patient

## 2014-10-27 NOTE — Other (Signed)
Bedside and Verbal shift change report given to W.Clarke,RN (oncoming nurse) by Polly Fulwider, RN   (offgoing nurse). Report included the following information SBAR, Kardex and MAR.

## 2014-10-27 NOTE — Progress Notes (Signed)
Hospitalist Progress Note    Patient: Tracy JohnsDarius Kalla MRN: [id number]  CSN: [id number]    Date of Birth: 05/08/1956  Age: 59 y.o.  Sex: male    DOA: 10/25/2014 LOS:  LOS: 2 days                Assessment/Plan     Patient Active Problem List   Diagnosis Code   ??? Drug overdose T50.901A   ??? Chest pain R07.9   ??? Perirectal abscess K61.1   ??? Surgery, other elective Z41.9   ??? Uncontrolled type II diabetes mellitus (HCC) E11.65   ??? Diabetes (HCC) E11.9   ??? Cellulitis L03.90   ??? Hyponatremia E87.1           Chief complain : peri rectal abscess    Perirectal abscess - s/p debridement perineal wound 10/27/2014.poly microbial growth with MSSA.  DM - uncontrolled.  HTN - controlled  CAD    Review of systems  General: No fevers or chills.  Cardiovascular: No chest pain or pressure. No palpitations.   Pulmonary: No shortness of breath.   Gastrointestinal: No nausea, vomiting.       Physical Exam:  General: WD, WN.  Awake, cooperative, no acute distress????  HEENT: NC, Atraumatic.  PERRLA, anicteric sclerae.  Lungs: CTA Bilaterally. No Wheezing/Rhonchi/Rales.  Heart:  Regular  rhythm,?? No murmur, No Rubs, No Gallops  Abdomen: Soft, Non distended, Non tender. ??+Bowel sounds,   Extremities: No c/c/e  Psych:???? Not anxious or agitated.  Neurologic:?? No acute neurological deficit.               Vital signs/Intake and Output:  Visit Vitals   Item Reading   ??? BP 92/55 mmHg   ??? Pulse 48   ??? Temp 97.5 ??F (36.4 ??C)   ??? Resp 10   ??? Ht 5\' 11"  (1.803 m)   ??? Wt 79.788 kg (175 lb 14.4 oz)   ??? BMI 24.54 kg/m2   ??? SpO2 100%     Current Shift:  01/06 0701 - 01/06 1900  In: 400 [I.V.:400]  Out: 505 [Urine:500]  Last three shifts:  01/04 1901 - 01/06 0700  In: 4987.5 [P.O.:1630; I.V.:3357.5]  Out: 3520 [Urine:3520]            Labs: Results:       Chemistry Recent Labs      10/27/14   0517  10/26/14   0537  10/25/14   0105   GLU  114*  125*  375*   NA  141  137  134*   K  3.8  3.4*  3.8   CL  105  103  99*   CO2  29  31  28    BUN  8  11  10     CREA  0.78  0.86  1.00   CA  8.4*  8.0*  8.1*   AGAP  7  3  7    BUCR  10*  13  10*   AP  32*  35*   --    TP  6.3*  6.4   --    ALB  2.2*  2.3*   --    GLOB  4.1*  4.1*   --    AGRAT  0.5*  0.6*   --       CBC w/Diff Recent Labs      10/27/14   0517  10/26/14   0537  10/25/14   0105   WBC  8.1  9.0  8.5   RBC  4.10*  4.05*  4.50*   HGB  11.6*  11.6*  13.0   HCT  35.7*  35.3*  38.6   PLT  443*  462*  492*   GRANS  50  52  57   LYMPH  35  32  28   EOS  Cardiac Enzymes Recent Labs      10/25/14   0105   CPK  58   CKND1  1.6      Coagulation No results for input(s): PTP, INR, APTT in the last 72 hours.    Invalid input(s): INREXT    Lipid Panel No results found for: CHOL, CHOLPOCT, CHOLX, CHLST, CHOLV, [id number], HDL, LDL, NLDLCT, DLDL, LDLC, DLDLP, 161096, VLDLC, VLDL, TGL, TGLX, TRIGL, [id number], TRIGP, TGLPOCT, [id number], CHHD, CHHDX   BNP No results for input(s): BNPP in the last 72 hours.   Liver Enzymes Recent Labs      10/27/14   0517   TP  6.3*   ALB  2.2*   AP  32*   SGOT  39*      Thyroid Studies No results found for: T4, T3U, TSH, TSHEXT     Procedures/imaging: see electronic medical records for all procedures/Xrays and details which were not copied into this note but were reviewed prior to creation of Plan

## 2014-10-27 NOTE — Progress Notes (Signed)
Shift Summary:  Pt had an uneventful night and slept well. Medicated with percocet and dilaudid for pain ad lib. IV fluids infusing well. Peri pad to perineal area with serosanguinous drng. Pt is NPO for surgery.   Patient Vitals for the past 12 hrs:   Temp Pulse Resp BP SpO2   10/26/14 2254 98.6 ??F (37 ??C) 60 18 122/55 mmHg 99 %

## 2014-10-27 NOTE — Other (Signed)
TRANSFER - OUT REPORT:    Verbal report given to H. Brynda Greathouseandall, RN (name) on Mayur Rhew  being transferred to OR (unit) for ordered procedure       Report consisted of patient???s Situation, Background, Assessment and   Recommendations(SBAR).     Information from the following report(s) SBAR and Kardex was reviewed with the receiving nurse.    Lines:   Peripheral IV 10/25/14 Right Antecubital (Active)   Site Assessment Clean, dry, & intact 10/26/2014 12:20 PM   Phlebitis Assessment 0 10/26/2014 12:20 PM   Infiltration Assessment 0 10/26/2014 12:20 PM   Dressing Status Clean, dry, & intact 10/26/2014 12:20 PM   Dressing Type Transparent 10/26/2014 12:20 PM   Hub Color/Line Status Pink;Infusing 10/26/2014 12:20 PM   Action Taken Open ports on tubing capped 10/25/2014  4:28 PM   Alcohol Cap Used Yes 10/26/2014 12:20 PM        Opportunity for questions and clarification was provided.      Patient transported with:   [employer]

## 2014-10-27 NOTE — Interval H&P Note (Signed)
H&P Update:  Tracy JohnsDarius [name] was seen and examined.  History and physical has been reviewed. The patient has been examined. There have been no significant clinical changes since the completion of the originally dated History and Physical.    Signed By: Izora RibasSTEVEN B Efraim Vanallen, MD     October 27, 2014 12:54 PM

## 2014-10-27 NOTE — Progress Notes (Signed)
Debridement done today.  Will get wound vac tomorrow.

## 2014-10-27 NOTE — Other (Signed)
Patients bs 65. Patient awake and alert drinking apple juice and eating graham crackers. Will give glucose tablets per protocol. Will recheck once table is given.

## 2014-10-27 NOTE — Other (Signed)
This nurse has reviewed and verified all entries by H. Pete GlatterNielson, RN for training purposes.

## 2014-10-27 NOTE — Progress Notes (Signed)
Nutrition Note Co-Sign Addendum:    Nutrition note written on 10/27/14 at 1127 for patient Tracy JohnsDarius Hainline was reviewed and discussed with appropriate persons.  RD agrees with current nutrition plan of care and will continue to monitor.    Please contact RD if further information is needed.    Guadelupe SabinJulia E Tracy, RD, CNSC  [phone]

## 2014-10-27 NOTE — Other (Signed)
Transfer of care performed at the [facility]. Opportunity for questions and clarification was provided. VSS upon transfer at BP 155/84 P 60 R 16 02 sats 100 on 2L NC.

## 2014-10-27 NOTE — Other (Signed)
Spoke with Dr. Katrinka BlazingSmith regarding patients pain level and concern of giving pain medicine due to his heart rate. See new orders.

## 2014-10-27 NOTE — Other (Signed)
Bedside and Verbal shift change report given to K. Anise Salvoeeves, RN (Cabin crewoncoming nurse) by Laurell RoofE. Clark, RN (offgoing nurse). Report included the following information SBAR and Kardex. Patient arrived on unit via stretcher from PACU. Resting in bed with report of pain 7/10. Morphine administered. Dinner arrived and patient eating. No additional needs reported.

## 2014-10-27 NOTE — Progress Notes (Signed)
Shift Summary: pt stated that he was in pain several times during the shift and medication was given. Pt ambulated to the bathroom to clean himself up and to have a BM.  Pt was given himself his own insulin 2 times today during my sift. Pt and I had a long talk about how important it is to get his blood sugar under control.  He stated that this was the best he has felt in along time.  And he was going to go home and continue to keep his sugar under control.  Pt was given all of his medication without problem.    Lab Results   Component Value Date/Time    GLUCOSE 125 10/26/2014 05:37 AM    GLUCOSE (POC) 81 10/26/2014 09:04 PM     Patient Vitals for the past 12 hrs:   Temp Pulse Resp BP SpO2   10/26/14 2254 98.6 ??F (37 ??C) 60 18 122/55 mmHg 99 %   10/26/14 1600 99 ??F (37.2 ??C) 62 16 137/67 mmHg 99 %

## 2014-10-27 NOTE — Brief Op Note (Signed)
BRIEF OPERATIVE NOTE    Date of Procedure: 10/27/2014   Preoperative Diagnosis: PERIRECTAL ABSCESS  Postoperative Diagnosis: PERIRECTAL ABSCESS    Procedure(s):  DEBRIDEMENT PERINEAL WOUND  Surgeon(s) and Role:     * Frankey PootSteven Xavier Fournier, MD - Primary  Anesthesia: General   Estimated Blood Loss: min  Specimens: * No specimens in log *   Findings: necrotic wound   Complications: none  Implants: * No implants in log *

## 2014-10-27 NOTE — Anesthesia Post-Procedure Evaluation (Signed)
Post-Anesthesia Evaluation and Assessment    Cardiovascular Function/Vital Signs  Visit Vitals   Item Reading   ??? BP 112/55 mmHg   ??? Pulse 60   ??? Temp 36.4 ??C (97.5 ??F)   ??? Resp 15   ??? Ht 5' 11"  (1.803 m)   ??? Wt 79.788 kg (175 lb 14.4 oz)   ??? BMI 24.54 kg/m2   ??? SpO2 100%       Patient is status post Procedure(s):  DEBRIDEMENT PERINEAL WOUND.    Nausea/Vomiting: Controlled.    Postoperative hydration reviewed and adequate.    Pain:  Pain Scale 1: Numeric (0 - 10) (10/27/14 1819)  Pain Intensity 1: 3 (10/27/14 1819)   Managed.    Neurological Status:   Neuro (WDL): Exceptions to WDL (10/27/14 1555)   At baseline.    Mental Status and Level of Consciousness: Baseline and stable.    Pulmonary Status:   O2 Device: Non-rebreather mask (10/27/14 1538)   Adequate oxygenation and airway patent.    Complications related to anesthesia: None    Post-anesthesia assessment completed. No concerns.    Patient has met all discharge requirements.    Signed By: Fabio Neighbors, MD

## 2014-10-27 NOTE — Other (Signed)
Received report from [employer] RN.  Assumed care of Mr. Tracy [name].

## 2014-10-27 NOTE — Progress Notes (Signed)
Vancomycin trough 7.8 drawn @ 0517 10/27/2014. Last bag hung at 1819 10/26/2014. Renal function continues to improve. Change to Vancomycin 1 gm IV q8hr. Continue to monitor and adjust accordingly

## 2014-10-27 NOTE — Other (Signed)
Bedside shift change report given to Laurell RoofE Clark  (oncoming nurse) by Alfonse AlpersWANDA L Dore Oquin, RN   (offgoing nurse). Report included the following information SBAR.

## 2014-10-27 NOTE — Other (Signed)
Patients bs 67. Patient is alert and awake per protocol give 4-6oz of juice. Will repeat bs in 15 minutes.

## 2014-10-28 LAB — METABOLIC PANEL, COMPREHENSIVE
A-G Ratio: 0.6 — ABNORMAL LOW (ref 0.8–1.7)
ALT (SGPT): 37 U/L (ref 16–61)
AST (SGOT): 39 U/L — ABNORMAL HIGH (ref 15–37)
Albumin: 2.2 g/dL — ABNORMAL LOW (ref 3.4–5.0)
Alk. phosphatase: 31 U/L — ABNORMAL LOW (ref 45–117)
Anion gap: 5 mmol/L (ref 3.0–18)
BUN/Creatinine ratio: 6 — ABNORMAL LOW (ref 12–20)
BUN: 5 MG/DL — ABNORMAL LOW (ref 7.0–18)
Bilirubin, total: 0.2 MG/DL (ref 0.2–1.0)
CO2: 32 mmol/L (ref 21–32)
Calcium: 8.2 MG/DL — ABNORMAL LOW (ref 8.5–10.1)
Chloride: 105 mmol/L (ref 100–108)
Creatinine: 0.89 MG/DL (ref 0.6–1.3)
GFR est AA: >60 mL/min/{1.73_m2} (ref >60)
GFR est non-AA: >60 mL/min/{1.73_m2} (ref >60)
Globulin: 4 g/dL (ref 2.0–4.0)
Glucose: 76 mg/dL (ref 74–99)
Potassium: 3.5 mmol/L (ref 3.5–5.5)
Protein, total: 6.2 g/dL — ABNORMAL LOW (ref 6.4–8.2)
Sodium: 142 mmol/L (ref 136–145)

## 2014-10-28 LAB — CBC WITH AUTOMATED DIFF
ABS. BASOPHILS: 0 10*3/uL (ref 0.0–0.06)
ABS. EOSINOPHILS: 0.4 10*3/uL (ref 0.0–0.4)
ABS. LYMPHOCYTES: 2.7 10*3/uL (ref 0.9–3.6)
ABS. MONOCYTES: 1.2 10*3/uL (ref 0.05–1.2)
ABS. NEUTROPHILS: 6.1 10*3/uL (ref 1.8–8.0)
BASOPHILS: 0 % (ref 0–2)
EOSINOPHILS: 4 % (ref 0–5)
HCT: 34.1 % — ABNORMAL LOW (ref 36.0–48.0)
HGB: 11 g/dL — ABNORMAL LOW (ref 13.0–16.0)
LYMPHOCYTES: 26 % (ref 21–52)
MCH: 28.5 PG (ref 24.0–34.0)
MCHC: 32.3 g/dL (ref 31.0–37.0)
MCV: 88.3 FL (ref 74.0–97.0)
MONOCYTES: 11 % — ABNORMAL HIGH (ref 3–10)
MPV: 9.1 FL — ABNORMAL LOW (ref 9.2–11.8)
NEUTROPHILS: 59 % (ref 40–73)
PLATELET: 422 10*3/uL — ABNORMAL HIGH (ref 135–420)
RBC: 3.86 M/uL — ABNORMAL LOW (ref 4.70–5.50)
RDW: 12.7 % (ref 11.6–14.5)
WBC: 10.4 10*3/uL (ref 4.6–13.2)

## 2014-10-28 LAB — GLUCOSE, POC
Glucose (POC): 172 mg/dL — ABNORMAL HIGH (ref 70–110)
Glucose (POC): 76 mg/dL (ref 70–110)
Glucose (POC): 66 mg/dL — ABNORMAL LOW (ref 70–110)
Glucose (POC): 89 mg/dL (ref 70–110)
Glucose (POC): 55 mg/dL — ABNORMAL LOW (ref 70–110)
Glucose (POC): 97 mg/dL (ref 70–110)
Glucose (POC): 154 mg/dL — ABNORMAL HIGH (ref 70–110)

## 2014-10-28 MED ORDER — INSULIN LISPRO 100 UNIT/ML INJECTION
100 unit/mL | Freq: Three times a day (TID) | SUBCUTANEOUS | Status: DC
Start: 2014-10-28 — End: 2014-10-29
  Administered 2014-10-29: 14:00:00 via SUBCUTANEOUS

## 2014-10-28 MED FILL — SODIUM CHLORIDE 0.9 % IV PIGGY BACK: INTRAVENOUS | Qty: 100

## 2014-10-28 MED FILL — DOCUSATE SODIUM 100 MG CAP: 100 mg | ORAL | Qty: 1

## 2014-10-28 MED FILL — SODIUM CHLORIDE 0.9 % IV PIGGY BACK: INTRAVENOUS | Qty: 250

## 2014-10-28 MED FILL — HYDROMORPHONE 2 MG/ML INJECTION SOLUTION: 2 mg/mL | INTRAMUSCULAR | Qty: 1

## 2014-10-28 MED FILL — PROPOFOL 10 MG/ML IV EMUL: 10 mg/mL | INTRAVENOUS | Qty: 20

## 2014-10-28 MED FILL — INSULIN GLARGINE 100 UNIT/ML INJECTION: 100 unit/mL | SUBCUTANEOUS | Qty: 1

## 2014-10-28 MED FILL — OXYCODONE-ACETAMINOPHEN 5 MG-325 MG TAB: 5-325 mg | ORAL | Qty: 2

## 2014-10-28 MED FILL — XYLOCAINE-MPF 20 MG/ML (2 %) INJECTION SOLUTION: 20 mg/mL (2 %) | INTRAMUSCULAR | Qty: 2

## 2014-10-28 MED FILL — ASPIRIN 81 MG CHEWABLE TAB: 81 mg | ORAL | Qty: 1

## 2014-10-28 MED FILL — KLOR-CON M20 MEQ TABLET,EXTENDED RELEASE: 20 mEq | ORAL | Qty: 1

## 2014-10-28 MED FILL — LACTATED RINGERS IV: INTRAVENOUS | Qty: 300

## 2014-10-28 MED FILL — PRAVASTATIN 20 MG TAB: 20 mg | ORAL | Qty: 4

## 2014-10-28 MED FILL — MORPHINE 2 MG/ML INJECTION: 2 mg/mL | INTRAMUSCULAR | Qty: 2

## 2014-10-28 MED FILL — DEX4 GLUCOSE 4 GRAM CHEWABLE TABLET: 4 gram | ORAL | Qty: 4

## 2014-10-28 MED FILL — ONDANSETRON (PF) 4 MG/2 ML INJECTION: 4 mg/2 mL | INTRAMUSCULAR | Qty: 2

## 2014-10-28 NOTE — Progress Notes (Signed)
[id number] Patient was able to draw and administer 15 units of insulin without assistance.     1024 Patient resting in bed. No needs reported at this time.     1443 Notified by CNA that patient awakened from a nap in a cold sweet requesting that his BG be checked. POC BG 55. Patient given glucose tabs and orange juice. Will recheck BG.    1518 Dr. Denyse DagoAmer made aware of event. No orders received.     1522 Recheck POC BG 97. Will continue to monitor.     1618 patient resting in NAD.     1821 No further events throughout the shift.

## 2014-10-28 NOTE — Other (Signed)
Bedside shift change report given to E Clark RN (oncoming nurse) by Aeris Hersman L Ryn Peine, RN   (offgoing nurse). Report included the following information SBAR.

## 2014-10-28 NOTE — Progress Notes (Signed)
Bedside shift change report given to Hewitt BladeWanda Clarke, RN (oncoming nurse) by Rudene ReKristie A Reeve, RN   (offgoing nurse). Report included the following information SBAR, Kardex, intake, output and OR summary.

## 2014-10-28 NOTE — Progress Notes (Signed)
Bedside and Verbal shift change report given to A. Salomon FickBanks, RN (Cabin crewoncoming nurse) by Laurell RoofE. Clark, RN (offgoing nurse). Report included the following information SBAR and Kardex. Patient resting in NAD at this time. Last medicated for pain at 1633. No additional needs reported.

## 2014-10-28 NOTE — Progress Notes (Signed)
Shift Summary:  Pt had a good night and slept well. Medicated with dilaudid and percocet for pain to perineum. Wound continues to have serosanguinous drng.IV fluids infusing well with antibiotic therapy. SCDs on all night.   Patient Vitals for the past 12 hrs:   Temp Pulse Resp BP SpO2   10/28/14 0345 98.4 ??F (36.9 ??C) (!) 54 13 137/65 mmHg 100 %   10/27/14 2238 98.6 ??F (37 ??C) (!) 52 13 134/67 mmHg 99 %

## 2014-10-28 NOTE — Progress Notes (Signed)
Readmission Risk Assessment:     Moderate Risk and MSSP/Good Help ACO patients    RRAT Score:  13-20 (18)    Initial Assessment: Chart reviewed, noted 59 yr old male with Medicare and Medicaid admited from home thru the ED for perirectal abscess; noted pt was seen and operated on about a week ago for same problem; noted pt is POD # 1 s/p debridement perineal wound;noted plan for wound vac.  Met with pt who stated that: his PCP was Dr Bernell List at [facility];  he lived with his girlfriend/discharge caregiver Andreas Newport and would return home at discharge; he had had difficulty caring for his wound at home; he had transportation problems and could not get to the [facility]; his Medicaid did not cover transport to MD appts; he was interested in having a visiting nurse to assist with dressing changes for wound vac; pt aware he would need to be considered "home bound" and MD would have to order home health.  FOC offered and completed for [facility] [phone]); referral placed to CMS.     Emergency Contact:  Andreas Newport, g'friend: [phone]    Pertinent Medical Hx:  CAD; diabetes; MI; htn; high cholesterol; chronic pain; arthritis       PCP/Specialists: Dr Bernell List      Community Services:       DME:          Moderate Risk Care Transition Plan:  1. Evaluate for [facility] or H2H, SNF, acute rehab, community care coordination of resources.  2. Involve patient/caregiver in assessment, planning, education and implement of intervention.  3. CM daily patient care huddles/interdisciplinary rounds.  4. PCP/Specialist appointment within 5 ??? 7 days made prior to discharge.  5. Facilitate transportation and logistics for follow-up appointments.  6. Medication reconciliation ??? Pharmacy  7. Formal handoff between hospital provider and post-acute provider to transition patient  Handoff to [place] Nurse Navigator or PCP practice.

## 2014-10-28 NOTE — Other (Signed)
Bedside and Verbal shift change report given to Roderick PeeW. Clarke RN (oncoming nurse) by  A. Banks RN  Physiological scientist(offgoing nurse). Report included the following information SBAR, Kardex, Recent Results and Med Rec Status.

## 2014-10-28 NOTE — Op Note (Signed)
[facility]                              [address]                          [place] [address]                               OPERATIVE REPORT    PATIENT:       [name], Tracy  MRN:              [id number]  DATE:             10/27/2014  BILLING:          [id number] LOCATION:         [id number]  DICTATING:     Vaishnavi Dalby, MD        PREOPERATIVE DIAGNOSIS:  Buttock wound, perineal wound.    POSTOPERATIVE DIAGNOSIS:  Buttock wound, perineal wound.    PROCEDURES PERFORMED:  Debridement of buttocks-perineal wound.    SURGEON:  Carlo Guevarra, MD.    ANESTHESIA:  General endotracheal.    ESTIMATED BLOOD LOSS:  Minimal.    SPECIMENS REMOVED: tissue    INDICATIONS:  This is a patient who had a previously drained perirectal  abscess who presents with a non-healing, necrotic wound.    DESCRIPTION OF PROCEDURE:  He was placed in the lithotomy position.  His  perineum was prepped and draped in the usual fashion.  Debridement was done  to any necrotic tissue in the wound, which was excised using the Metzenbaum  scissors.  Debridement was done down to the muscle and fascial level.  The  area was irrigated with normal saline and packed with gauze.                     Nashayla Telleria, MD      SH:wmx  D: 10/28/2014 07:07 A   T: 10/28/2014 08:37 A  BY:  [id number]  Job#:  [id number]  CScriptDoc #:  [id number]    cc:   Mattox Schorr, MD        Syed S. Hussain, MD

## 2014-10-28 NOTE — Other (Signed)
GLYCEMIC CONTROL & NUTRITION:    - Pt with hypoglycemic (55) event @ ~1445 today  - Recommend decrease basal insulin to Lantus 30 units qhs and decrease mealtime to Humalog 8 units qac  - pt is s/p wound debridement and is requiring less insulin, may need to adjust daily  - Recommend discharge pt on reduced dose, home doses are very large (Lantus 75 units qhs and Novalog 30 units qac)       M. Remonia Richteratherine Brown, RD

## 2014-10-28 NOTE — Progress Notes (Signed)
Feels OK  Wound cleaned in OR  Wound vac hopefully today.  Can d/c after wound vac arranged for home.

## 2014-10-28 NOTE — Progress Notes (Signed)
Shift Summary : Alert and oriented. Physical assessment benign except for rectal wound that continues with pink drainage. Up in chair late shift without problems. Voiding clear yellow urine in ample amounts. Blood sugar at bedtime 229, but refused insulin coverage because of earlier hypoglycemic episode. Accepted lantus dose and had snack. Pain continues to be managed by oral medication. No other problems known.

## 2014-10-28 NOTE — Progress Notes (Signed)
Hospitalist Progress Note    Patient: Tracy JohnsDarius [name] MRN: [id number]  CSN: [id number]    Date of Birth: Nov 30, 1955  Age: 59 y.o.  Sex: male    DOA: 10/25/2014 LOS:  LOS: 3 days                Assessment/Plan     Patient Active Problem List   Diagnosis Code   ??? Drug overdose T50.901A   ??? Chest pain R07.9   ??? Perirectal abscess K61.1   ??? Surgery, other elective Z41.9   ??? Uncontrolled type II diabetes mellitus (HCC) E11.65   ??? Diabetes (HCC) E11.9   ??? Cellulitis L03.90   ??? Hyponatremia E87.1           Chief complain : peri rectal abscess    Perirectal abscess - s/p debridement perineal wound 10/27/2014.poly microbial growth with MSSA.  DM - uncontrolled.  HTN - controlled  CAD  Wound vac  Can discharge after wound vac arranged. Switch to levaquin for 7 more days of antibiotic    Review of systems  General: No fevers or chills.  Cardiovascular: No chest pain or pressure. No palpitations. ??  Pulmonary: No shortness of breath. ??  Gastrointestinal: No nausea, vomiting.       Physical Exam:  General:??????????????????WD, WN.?? Awake, cooperative, no acute distress????  HEENT:????????????????????NC, Atraumatic.?? PERRLA, anicteric sclerae.  Lungs:????????????????????????CTA Bilaterally. No Wheezing/Rhonchi/Rales.  Heart:????????????????????????????Regular?? rhythm,?? No murmur, No Rubs, No Gallops  Abdomen:????????????Soft, Non distended, Non tender. ??+Bowel sounds, ??  Extremities:??????No c/c/e  Psych:????????????????????????Not anxious or agitated.  Neurologic:????????No acute neurological deficit.????           Vital signs/Intake and Output:  Visit Vitals   Item Reading   ??? BP 151/74 mmHg   ??? Pulse 56   ??? Temp 97.6 ??F (36.4 ??C)   ??? Resp 14   ??? Ht 5\' 11"  (1.803 m)   ??? Wt 79.788 kg (175 lb 14.4 oz)   ??? BMI 24.54 kg/m2   ??? SpO2 100%     Current Shift:  01/07 0701 - 01/07 1900  In: 1356.3 [P.O.:1280; I.V.:76.3]  Out: 2100 [Urine:2100]  Last three shifts:  01/05 1901 - 01/07 0700  In: 5445 [P.O.:610; I.V.:4835]  Out: 3105 [Urine:3100]            Labs: Results:       Chemistry Recent Labs      10/28/14    0520  10/27/14   0517  10/26/14   0537   GLU  76  114*  125*   NA  142  141  137   K  3.5  3.8  3.4*   CL  105  105  103   CO2  32  29  31   BUN  5*  8  11   CREA  0.89  0.78  0.86   CA  8.2*  8.4*  8.0*   AGAP  5  7  3    BUCR  6*  10*  13   AP  31*  32*  35*   TP  6.2*  6.3*  6.4   ALB  2.2*  2.2*  2.3*   GLOB  4.0  4.1*  4.1*   AGRAT  0.6*  0.5*  0.6*      CBC w/Diff Recent Labs      10/28/14   0520  10/27/14   0517  10/26/14   0537   WBC  10.4  8.1  9.0   RBC  3.86*  4.10*  4.05*   HGB  11.0*  11.6*  11.6*   HCT  34.1*  35.7*  35.3*   PLT  422*  443*  462*   GRANS  59  50  52   LYMPH  26  35  32   EOS  Cardiac Enzymes No results for input(s): CPK, CKND1, MYO in the last 72 hours.    Invalid input(s): CKRMB, TROIP   Coagulation No results for input(s): PTP, INR, APTT in the last 72 hours.    Invalid input(s): INREXT    Lipid Panel No results found for: CHOL, CHOLPOCT, CHOLX, CHLST, CHOLV, [id number], HDL, LDL, NLDLCT, DLDL, LDLC, DLDLP, 161096, VLDLC, VLDL, TGL, TGLX, TRIGL, [id number], TRIGP, TGLPOCT, [id number], CHHD, CHHDX   BNP No results for input(s): BNPP in the last 72 hours.   Liver Enzymes Recent Labs      10/28/14   0520   TP  6.2*   ALB  2.2*   AP  31*   SGOT  39*      Thyroid Studies No results found for: T4, T3U, TSH, TSHEXT     Procedures/imaging: see electronic medical records for all procedures/Xrays and details which were not copied into this note but were reviewed prior to creation of Plan

## 2014-10-28 NOTE — Op Note (Signed)
[facility]                              [address]                          [place] [place] [id number]                               OPERATIVE REPORT    PATIENT:       Tracy JohnsDIGGS, Tracy [name]  MRN:              [id number]  DATE:             10/27/2014  BILLING:          [id number] LOCATION:         [id number]  DICTATING:     Frankey PootSteven Matei Magnone, MD        PREOPERATIVE DIAGNOSIS:  Buttock wound, perineal wound.    POSTOPERATIVE DIAGNOSIS:  Buttock wound, perineal wound.    PROCEDURES PERFORMED:  Debridement of buttocks-perineal wound.    SURGEON:  Frankey PootSteven Yazid Pop, MD.    ANESTHESIA:  General endotracheal.    ESTIMATED BLOOD LOSS:  Minimal.    SPECIMENS REMOVED: tissue    INDICATIONS:  This is a patient who had a previously drained perirectal  abscess who presents with a non-healing, necrotic wound.    DESCRIPTION OF PROCEDURE:  He was placed in the lithotomy position.  His  perineum was prepped and draped in the usual fashion.  Debridement was done  to any necrotic tissue in the wound, which was excised using the Metzenbaum  scissors.  Debridement was done down to the muscle and fascial level.  The  area was irrigated with normal saline and packed with gauze.                     Frankey PootSteven Falyn Rubel, MD      SH:wmx  D: 10/28/2014 07:07 A   T: 10/28/2014 08:37 A  BY:  [id number]  Job#:  [id number]  CScriptDoc #:  [id number]    cc:   Frankey PootSteven Camella Seim, MD        Clarise CruzSyed S. Eula ListenHussain, MD

## 2014-10-29 LAB — GLUCOSE, POC
Glucose (POC): 229 mg/dL — ABNORMAL HIGH (ref 70–110)
Glucose (POC): 66 mg/dL — ABNORMAL LOW (ref 70–110)
Glucose (POC): 66 mg/dL — ABNORMAL LOW (ref 70–110)
Glucose (POC): 135 mg/dL — ABNORMAL HIGH (ref 70–110)
Glucose (POC): 119 mg/dL — ABNORMAL HIGH (ref 70–110)
Glucose (POC): 52 mg/dL — ABNORMAL LOW (ref 70–110)
Glucose (POC): 144 mg/dL — ABNORMAL HIGH (ref 70–110)

## 2014-10-29 LAB — METABOLIC PANEL, COMPREHENSIVE
A-G Ratio: 0.5 — ABNORMAL LOW (ref 0.8–1.7)
ALT (SGPT): 37 U/L (ref 16–61)
AST (SGOT): 41 U/L — ABNORMAL HIGH (ref 15–37)
Albumin: 2.4 g/dL — ABNORMAL LOW (ref 3.4–5.0)
Alk. phosphatase: 32 U/L — ABNORMAL LOW (ref 45–117)
Anion gap: 7 mmol/L (ref 3.0–18)
BUN/Creatinine ratio: 5 — ABNORMAL LOW (ref 12–20)
BUN: 4 MG/DL — ABNORMAL LOW (ref 7.0–18)
Bilirubin, total: 0.2 MG/DL (ref 0.2–1.0)
CO2: 30 mmol/L (ref 21–32)
Calcium: 8.6 MG/DL (ref 8.5–10.1)
Chloride: 105 mmol/L (ref 100–108)
Creatinine: 0.87 MG/DL (ref 0.6–1.3)
GFR est AA: >60 mL/min/{1.73_m2} (ref >60)
GFR est non-AA: >60 mL/min/{1.73_m2} (ref >60)
Globulin: 4.4 g/dL — ABNORMAL HIGH (ref 2.0–4.0)
Glucose: 64 mg/dL — ABNORMAL LOW (ref 74–99)
Potassium: 3.3 mmol/L — ABNORMAL LOW (ref 3.5–5.5)
Protein, total: 6.8 g/dL (ref 6.4–8.2)
Sodium: 142 mmol/L (ref 136–145)

## 2014-10-29 LAB — CBC WITH AUTOMATED DIFF
ABS. BASOPHILS: 0 10*3/uL (ref 0.0–0.06)
ABS. EOSINOPHILS: 0.3 10*3/uL (ref 0.0–0.4)
ABS. LYMPHOCYTES: 2.8 10*3/uL (ref 0.9–3.6)
ABS. MONOCYTES: 1 10*3/uL (ref 0.05–1.2)
ABS. NEUTROPHILS: 3.7 10*3/uL (ref 1.8–8.0)
BASOPHILS: 0 % (ref 0–2)
EOSINOPHILS: 4 % (ref 0–5)
HCT: 35.4 % — ABNORMAL LOW (ref 36.0–48.0)
HGB: 11.6 g/dL — ABNORMAL LOW (ref 13.0–16.0)
LYMPHOCYTES: 36 % (ref 21–52)
MCH: 28.6 PG (ref 24.0–34.0)
MCHC: 32.8 g/dL (ref 31.0–37.0)
MCV: 87.4 FL (ref 74.0–97.0)
MONOCYTES: 13 % — ABNORMAL HIGH (ref 3–10)
MPV: 9.1 FL — ABNORMAL LOW (ref 9.2–11.8)
NEUTROPHILS: 47 % (ref 40–73)
PLATELET: 403 10*3/uL (ref 135–420)
RBC: 4.05 M/uL — ABNORMAL LOW (ref 4.70–5.50)
RDW: 12.6 % (ref 11.6–14.5)
WBC: 7.9 10*3/uL (ref 4.6–13.2)

## 2014-10-29 LAB — CULTURE, WOUND W GRAM STAIN

## 2014-10-29 MED ORDER — PRAVASTATIN 80 MG TAB
80 mg | ORAL_TABLET | Freq: Every evening | ORAL | Status: DC
Start: 2014-10-29 — End: 2015-04-13

## 2014-10-29 MED ORDER — LEVOFLOXACIN 750 MG TAB
750 mg | ORAL_TABLET | Freq: Every day | ORAL | Status: AC
Start: 2014-10-29 — End: 2014-11-05

## 2014-10-29 MED ORDER — INSULIN GLARGINE 100 UNIT/ML INJECTION
100 unit/mL | Freq: Every evening | SUBCUTANEOUS | Status: DC
Start: 2014-10-29 — End: 2014-10-29

## 2014-10-29 MED ORDER — AMLODIPINE 10 MG TAB
10 mg | ORAL_TABLET | Freq: Every day | ORAL | Status: DC
Start: 2014-10-29 — End: 2015-04-13

## 2014-10-29 MED ORDER — INSULIN GLARGINE 100 UNIT/ML (3 ML) SUB-Q PEN
100 unit/mL (3 mL) | Freq: Every evening | SUBCUTANEOUS | Status: DC
Start: 2014-10-29 — End: 2015-04-13

## 2014-10-29 MED FILL — OXYCODONE-ACETAMINOPHEN 5 MG-325 MG TAB: 5-325 mg | ORAL | Qty: 2

## 2014-10-29 MED FILL — SODIUM CHLORIDE 0.9 % IV PIGGY BACK: INTRAVENOUS | Qty: 100

## 2014-10-29 MED FILL — HYDROMORPHONE 2 MG/ML INJECTION SOLUTION: 2 mg/mL | INTRAMUSCULAR | Qty: 1

## 2014-10-29 MED FILL — ASPIRIN 81 MG CHEWABLE TAB: 81 mg | ORAL | Qty: 1

## 2014-10-29 MED FILL — SODIUM CHLORIDE 0.9 % IV PIGGY BACK: INTRAVENOUS | Qty: 250

## 2014-10-29 MED FILL — DOCUSATE SODIUM 100 MG CAP: 100 mg | ORAL | Qty: 1

## 2014-10-29 MED FILL — DEXTROSE 50% IN WATER (D50W) IV SYRG: INTRAVENOUS | Qty: 50

## 2014-10-29 MED FILL — HUMALOG U-100 INSULIN 100 UNIT/ML SUBCUTANEOUS SOLUTION: 100 unit/mL | SUBCUTANEOUS | Qty: 3

## 2014-10-29 MED FILL — PRAVASTATIN 20 MG TAB: 20 mg | ORAL | Qty: 4

## 2014-10-29 MED FILL — KLOR-CON M20 MEQ TABLET,EXTENDED RELEASE: 20 mEq | ORAL | Qty: 1

## 2014-10-29 MED FILL — INSULIN GLARGINE 100 UNIT/ML INJECTION: 100 unit/mL | SUBCUTANEOUS | Qty: 1

## 2014-10-29 NOTE — Progress Notes (Signed)
Chart reviewed, noted plan for discharge home today.  CMgr attempted to set up [facility]: t/c [employer] / Medicaid transport / Wallace CullensGray who [facility] which did not cover tranportation.  Discussed with pt who stated he would have difficulty with transport and requested that home health be activated.  He is aware that he will need to make arrangements to get to [facility].

## 2014-10-29 NOTE — Wound Image (Signed)
Wound care in for dressing change. Wound vac applied to buttock. Home wound vac submitted and waiting for approval.

## 2014-10-29 NOTE — Progress Notes (Addendum)
Shift Summary:  Pt slept on and off during the night and medicated with dilaudid and percocet for complaint of pain to perineal area and continues to have serosanguinous drng. Pt had a hypoglycemic episode at 0500 with BS 66, 4oz juice given with no change in BS. 1/2 amp D50 given with good results. Dr Eula ListenHussain called and made aware. Lantus decreased from 40 to 30units. Resting without distress at this time

## 2014-10-29 NOTE — Progress Notes (Signed)
D/C when OK with medicine.  Needs wound vac for home.   [facility]

## 2014-10-29 NOTE — Other (Addendum)
GLYCEMIC CONTROL AND NUTRITION    Assessment/Recommendations:  - Pt has been requiring much less insulin IP compared to home doses & has had 3 hypoglycemic events in the last 36 hours, recommend d/c on current doses   - Bedside education given (see note below)  - Encouraged OP DM Self Management Education Class  - Encouraged Consistent CHO diet  - Pt states he needs scripts for insulins and glucometer with all supplies.   - Pt stated that he misses Lantus dose on average ~4x/week and misses Novalog mealtime doses ~4 days/week   * shared Glucose management graph with pt and discussed impact of taking insulin daily on BG   * pt made goal of taking Lantus everyday @ 6am in the mornings upon discharge                 Most recent blood glucose values:   Lab Results   Component Value Date/Time    GLU 64* 10/29/2014 04:10 AM    GLUCPOC 144* 10/29/2014 11:31 AM    GLUCPOC 52* 10/29/2014 11:14 AM    GLUCPOC 119* 10/29/2014 07:18 AM         Current A1C is equivalent to average blood glucose of 297 mg/dl over the past 2-3 months.    Lab Results   Component Value Date/Time    HEMOGLOBIN A1C 11.9 10/18/2014 01:28 PM     Current hospital diabetes medications: ??  - Lantus 30 units qhs (1st dose tonight)  - Humalog 8 units qac (1st dose breakfast today)  - Humalog Very Insulin Resistant Corrective Coverage    Previous day's insulin requirements: ??  - 71 units total (40 Lantus, 21 Humalog, 10 Regular)    Home diabetes medications:  - Lantus 75 units qhs  - Novalog 30 units QAC    Anthropometrics:   Body mass index is 24.54 kg/(m^2).  Last 3 Recorded Weights in this Encounter    10/26/14 2254 10/27/14 2238 10/28/14 2322   Weight: 79.788 kg (175 lb 14.4 oz) 79.788 kg (175 lb 14.4 oz) 79.788 kg (175 lb 14.4 oz)    Ht Readings from Last 1 Encounters:   10/25/14 5\' 11"  (1.803 m)       Diet:    Active Orders   Diet    DIET DIABETIC CONSISTENT CARB Regular       Intake:   Patient Vitals for the past 100 hrs:   % Diet Eaten    10/29/14 0839 100 %   10/28/14 1252 100 %   10/28/14 0940 100 %   10/26/14 2257 100 %   10/26/14 1346 100 %   10/26/14 0900 100 %   10/25/14 1552 100 %     Diabetes Patient/Family Education Record    FACTORS THAT MAY INFLUENCE PATIENTS ABILITY TO LEARN OR COMPLY WITH RECOMMENDATIONS:      Language barrier       Cultural needs      Motivation       Cognitive limitation       Physical      Education       Physiological factors      Hearing/vision/speaking impairment      Religious beliefs       Financial factors     Other:     No factors identified at this time.     Person Instructed:      Patient      Family     Other     Preference  for Learning:      Verbal      Written     Demonstration     Level of Comprehension & Competence:      Good                                       Fair                                       Poor                               Needs Reinforcement   [   ]Teachback completed    Education Component:     Medication management, including how to administer insulin (when appropriate) and potential medication interactions (pt successfully self administered insulin with RN on floor last 2 days, also discussed rotating sites and bruising/knots/scar tissue)     Nutritional management, including the role of carbohydrate intake in blood glucose management (specifically discussed CHO counting, portion control and need to eat consistently)     Exercise (discussed effects on lowering BG)     Signs, symptoms, and treatment of hyperglycemia and hypoglycemia     Target blood glucose levels     Importance of blood glucose monitoring and how to obtain a blood glucose meter      Instruction on use of meter     Discuss and provide patient with HbA1C results     Sick day guidelines     Health complications/consequences of non-compliance (specifically wound healing)     Contact Information Provided    Plan for post-discharge education or self-management support:      Outpatient class schedule provided             Patient Declined     Scheduled for outpatient classes (date) _______               Elwyn Reach, RD

## 2014-10-29 NOTE — Discharge Summary (Signed)
Discharge Summary    Patient: Tracy [name] MRN: [id number]  CSN: [id number]    Date of Birth: 04-28-1956  Age: 59 y.o.  Sex: male    DOA: 10/25/2014 LOS:  LOS: 4 days   Discharge Date:      Primary Care Provider:  Phys Other, MD    Admission Diagnoses: Diabetes [facility])  PERIRECTAL ABSCESS    Discharge Diagnoses:    Problem List as of 10/29/2014  Date Reviewed: 11/11/2014          Codes Class Noted - Resolved    Diabetes (HCC) ICD-10-CM: E11.9  ICD-9-CM: 250.00  10/25/2014 - Present        Cellulitis ICD-10-CM: L03.90  ICD-9-CM: 682.9  10/25/2014 - Present        Hyponatremia ICD-10-CM: E87.1  ICD-9-CM: 276.1  10/25/2014 - Present        Uncontrolled type II diabetes mellitus (HCC) ICD-10-CM: E11.65  ICD-9-CM: 250.02  10/19/2014 - Present        * (Principal)Perirectal abscess ICD-10-CM: K61.1  ICD-9-CM: 566  10/18/2014 - Present        Surgery, other elective ICD-10-CM: Z41.9  ICD-9-CM: V50.9  10/18/2014 - Present        Chest pain ICD-10-CM: R07.9  ICD-9-CM: 786.50  06/07/2014 - Present        Drug overdose ICD-10-CM: T50.901A  ICD-9-CM: 977.9, E980.5  08/28/2013 - Present              Discharge Medications:     Current Discharge Medication List      START taking these medications    Details   amLODIPine (NORVASC) 10 mg tablet Take 1 Tab by mouth daily.  Qty: 30 Tab, Refills: 2      insulin glargine (LANTUS SOLOSTAR) 100 unit/mL (3 mL) pen 8 Units by SubCUTAneous route nightly.  Qty: 1 Each, Refills: 2      levofloxacin (LEVAQUIN) 750 mg tablet Take 1 Tab by mouth daily for 7 days.  Qty: 7 Tab, Refills: 0         CONTINUE these medications which have CHANGED    Details   !! pravastatin (PRAVACHOL) 80 mg tablet Take 1 Tab by mouth nightly.  Qty: 30 Tab, Refills: 2       !! - Potential duplicate medications found. Please discuss with provider.      CONTINUE these medications which have NOT CHANGED    Details   oxyCODONE-acetaminophen (PERCOCET) 5-325 mg per tablet Take 1-2 Tabs by  mouth every four (4) hours as needed for Pain. Max Daily Amount: 12 Tabs.  Qty: 30 Tab, Refills: 0      !! pravastatin (PRAVACHOL) 80 mg tablet Take 80 mg by mouth nightly.      docusate sodium (COLACE) 100 mg capsule Take 1 Cap by mouth two (2) times a day.  Qty: 20 Cap, Refills: 2      aspirin 81 mg chewable tablet Take 81 mg by mouth daily.      clopidogrel (PLAVIX) 75 mg tablet Take 75 mg by mouth daily.       !! - Potential duplicate medications found. Please discuss with provider.      STOP taking these medications       ciprofloxacin HCl (CIPRO) 250 mg tablet Comments:   Reason for Stopping:         metroNIDAZOLE (FLAGYL) 500 mg tablet Comments:   Reason for Stopping:         atenolol (TENORMIN) 25 mg tablet Comments:  Reason for Stopping:         insulin glargine (LANTUS) 100 unit/mL injection Comments:   Reason for Stopping:         oxyCODONE-acetaminophen (PERCOCET 7.5) 7.5-325 mg per tablet Comments:   Reason for Stopping:         morphine CR (MS CONTIN) 30 mg CR tablet Comments:   Reason for Stopping:         insulin aspart (NOVOLOG) 100 unit/mL injection Comments:   Reason for Stopping:               Discharge Condition: Good    Procedures : Debridement of buttocks-perineal wound    Consults: General Surgery      PHYSICAL EXAM   Visit Vitals   Item Reading   ??? BP 120/53 mmHg   ??? Pulse 55   ??? Temp 98.3 ??F (36.8 ??C)   ??? Resp 16   ??? Ht 5\' 11"  (1.803 m)   ??? Wt 79.788 kg (175 lb 14.4 oz)   ??? BMI 24.54 kg/m2   ??? SpO2 100%     General: Awake, cooperative, no acute distress????  HEENT: NC, Atraumatic.  PERRLA, EOMI. Anicteric sclerae.  Lungs:  CTA Bilaterally. No Wheezing/Rhonchi/Rales.  Heart:  Regular  rhythm,?? No murmur, No Rubs, No Gallops  Abdomen: Soft, Non distended, Non tender. +Bowel sounds,   Extremities: No c/c/e  Psych:???? Not anxious or agitated.  Neurologic:?? No acute neurological deficits.                                     Admission HPI : Per Dr. Vena RuaHussain   Tracy [name] is a 59 y.o. male who presents to ED via EMS for a wound check and intractable pain. Pt had a pilonidal cyst of the left buttock removed by Dr. Laurance FlattenHopson in the OR 1 week ago, states he has been doing dressing changes by himself, and the pain has not improved. Also c/o generalized abdominal and chest pain which comes and goes and exacerbated with the worsening of the buttock pain. PMHx includes CAD, 2 MIs, HTN, diabetes, hypercholesteremia, and arthritis. PSHx includes coronary stent placement. Pertinent negatives include no back pain, no cough, no fever, no headaches, no nausea, no palpitations, no shortness of breath and no vomiting. Patient denies any other symptoms or complaints. CT scan of the pelvis shows pronounced perirectal inflammation. Surgery (Dr. Laurance FlattenHopson) consulted who requested medical admission due to extensive medical history including diabetes and CAD.    Hospital Course :   Perirectal abscess - s/p debridement perineal wound 10/27/2014.poly microbial growth with MSSA. Will discharge on levaquin for 7 more days. He was on vancomycin and zosyn. Wound vac placed, he will follow up with general surgery and wound clinic.  DM - uncontrolled. His home PTA meds showed he takes 75 units of lantus HS and 30 units of humalog before meals. He has required much less dose when he was on diabetic diet. He had hypoglycemic episodes with lantus 30 units. He is now on 8 units of lantus daily.   HTN - he is on atenolol at home. His HR is 50s, switched to norvasc.   He needs to follow up with PCP for further diabetes and HTN management.    Activity: Activity as tolerated    Diet: Diabetic Diet    Follow-up: PCP, general surgery    Disposition: home with home health  Minutes spent on discharge: 45       Labs: Results:       Chemistry Recent Labs      10/29/14   0410  10/28/14   0520  10/27/14   0517   GLU  64*  76  114*   NA  142  142  141   K  3.3*  3.5  3.8   CL  105  105  105   CO2  30  32  29    BUN  4*  5*  8   CREA  0.87  0.89  0.78   CA  8.6  8.2*  8.4*   AGAP  7  5  7    BUCR  5*  6*  10*   AP  32*  31*  32*   TP  6.8  6.2*  6.3*   ALB  2.4*  2.2*  2.2*   GLOB  4.4*  4.0  4.1*   AGRAT  0.5*  0.6*  0.5*      CBC w/Diff Recent Labs      10/29/14   0410  10/28/14   0520  10/27/14   0517   WBC  7.9  10.4  8.1   RBC  4.05*  3.86*  4.10*   HGB  11.6*  11.0*  11.6*   HCT  35.4*  34.1*  35.7*   PLT  403  422*  443*   GRANS  47  59  50   LYMPH  36  26  35   EOS  4  4  4       Cardiac Enzymes No results for input(s): CPK, CKND1, MYO in the last 72 hours.    Invalid input(s): CKRMB, TROIP   Coagulation No results for input(s): PTP, INR, APTT in the last 72 hours.    Invalid input(s): INREXT    Lipid Panel No results found for: CHOL, CHOLPOCT, CHOLX, CHLST, CHOLV, [id number], HDL, LDL, NLDLCT, DLDL, LDLC, DLDLP, [id number], VLDLC, VLDL, TGL, TGLX, TRIGL, [id number], TRIGP, TGLPOCT, [id number], CHHD, CHHDX   BNP No results for input(s): BNPP in the last 72 hours.   Liver Enzymes Recent Labs      10/29/14   0410   TP  6.8   ALB  2.4*   AP  32*   SGOT  41*      Thyroid Studies No results found for: T4, T3U, TSH, TSHEXT         Significant Diagnostic Studies: Ct Pelv W Cont    10/25/2014   COMPARISON: None  INDICATION: Evaluation of pilonidal abscess  TECHNIQUE: Axial was performed through the pelvis with IV but no oral contrast.  Coronal and sagittal reformations were generated.   ============  FINDINGS:  PELVIC ORGANS: Prostate and seminal vesicles are unremarkable. Trace free fluid in the pelvis, nonspecific. No organized pelvic fluid collections. Small hydroceles noted bilaterally.  LYMPH NODES: No technically enlarged nodes.  BOWEL: No wall thickening or dilation. The appendix is normal.  VASCULATURE: Edematous calcifications present at the aortic bifurcation and iliac vessels.  OTHER: No aggressive appearing osseous lesion.   There is mottled subcutaneous gas with fairly  pronounced subcutaneous fat stranding in the left perineum with associated overlying skin thickening with inflammatory changes extending to the ischioanal fossa and adjacent to the rectum, just distal to the coccyx. No drainable fluid collection is identified. There is a left medial cleft skin defect measuring 2.4 cm anterior posterior by approximately 1.4  cm craniocaudal (axial series image 144 and coronal series image 48).    ============     10/25/2014   IMPRESSION:   1. Pronounced perianal/perirectal inflammatory changes with an ulcerative skin defect in the left medial gluteal cleft and associated skin thickening as well as scattered subcutaneous emphysema extending into the ischioanal fossa. No drainable fluid collection is identified. 2. Trace  pelvic free fluid which is nonspecific and may be physiologic. No organized pelvic fluid collection.     Xr Chest [facility]    10/25/2014   CHEST AP PORTABLE  Indication: Chest pain.  Comparison: 06/07/14.   Findings: Patient is rotated to the right. The lungs appear clear. No evidence for pneumothorax or pleural effusion. Cardiac silhouette and pulmonary vascularity appear within normal limits.     10/25/2014   IMPRESSION:  No acute cardiopulmonary disease identified.                 CC: Phys Other, MD

## 2014-10-29 NOTE — Progress Notes (Signed)
[id number] Wound care nurse in to place wound vac. Patient pre-medicated for pain.     1118 POC BG 52. Patient already consuming graham crackers, peanut butter, and orange juice. Entire 1 ampule of D50 given due to prior hyperglycemic events in the last 24 hours. Patient diaphoretic and tremulous at this time. Will recheck BG in 10 minutes. Dr. Denyse DagoAmer made aware.    1131 Recheck POC BG 144. Will continue to monitor.     1343 Patient being discharged home. Portable wound vac changed by wound care nurse. Patient to fu with wound care clinic on 11/02/2014 at 1100.     1426 Discharge instructions reviewed with patient and discharge caregiver including home instructions, follow up, and prescriptions. Both voiced understanding. AVR reviewed with Jacklyn Shell. Stancel, RN

## 2014-10-29 NOTE — Progress Notes (Signed)
Bedside shift change report given to E Clark (oncoming nurse) by Remona Boom L Kolbi Tofte, RN   (offgoing nurse). Report included the following information SBAR.

## 2014-10-30 LAB — EKG, 12 LEAD, INITIAL
Atrial Rate: 79 {beats}/min
Calculated P Axis: 66 degrees
Calculated R Axis: 19 degrees
Calculated T Axis: 53 degrees
Diagnosis: NORMAL
P-R Interval: 174 ms
Q-T Interval: 384 ms
QRS Duration: 74 ms
QTC Calculation (Bezet): 440 ms
Ventricular Rate: 79 {beats}/min

## 2014-11-02 ENCOUNTER — Encounter: Payer: MEDICARE | Primary: Family Medicine

## 2015-04-06 ENCOUNTER — Inpatient Hospital Stay
Admit: 2015-04-06 | Discharge: 2015-04-13 | Disposition: A | Discharge status: Home / Self Care | Payer: MEDICARE | Attending: Family Medicine | Admitting: Family Medicine

## 2015-04-06 ENCOUNTER — Emergency Department: Admit: 2015-04-06 | Payer: MEDICARE | Primary: Family Medicine

## 2015-04-06 DIAGNOSIS — T405X2A Poisoning by cocaine, intentional self-harm, initial encounter: Principal | ICD-10-CM

## 2015-04-06 LAB — GLUCOSTABILIZER
D50 administered: 0 ml
D50 administered: 0 ml
D50 administered: 0 ml
D50 administered: 0 ml
D50 administered: 0 ml
D50 administered: 0 ml
D50 administered: 0 ml
D50 administered: 0 ml
D50 administered: 0 ml
D50 order: 0 ml
D50 order: 0 ml
D50 order: 0 ml
D50 order: 0 ml
D50 order: 0 ml
D50 order: 0 ml
D50 order: 0 ml
D50 order: 0 ml
D50 order: 0 ml
Glucose: 491 mg/dL
Glucose: 409 mg/dL
Glucose: 348 mg/dL
Glucose: 251 mg/dL
Glucose: 244 mg/dL
Glucose: 170 mg/dL
Glucose: 144 mg/dL
Glucose: 104 mg/dL
Glucose: 132 mg/dL
High target: 180 mg/dL
High target: 180 mg/dL
High target: 180 mg/dL
High target: 180 mg/dL
High target: 180 mg/dL
High target: 180 mg/dL
High target: 180 mg/dL
High target: 180 mg/dL
High target: 180 mg/dL
Insulin adminstered: 8.6 units/hour
Insulin adminstered: 3.5 units/hour
Insulin adminstered: 5.8 units/hour
Insulin adminstered: 1.9 units/hour
Insulin adminstered: 3.7 units/hour
Insulin adminstered: 2.2 units/hour
Insulin adminstered: 1.7 units/hour
Insulin adminstered: 0.4 units/hour
Insulin adminstered: 0 units/hour
Insulin order: 8.6 units/hour
Insulin order: 3.5 units/hour
Insulin order: 5.8 units/hour
Insulin order: 1.9 units/hour
Insulin order: 3.7 units/hour
Insulin order: 2.2 units/hour
Insulin order: 1.7 units/hour
Insulin order: 0.4 units/hour
Insulin order: 0 units/hour
Low target: 140 mg/dL
Low target: 140 mg/dL
Low target: 140 mg/dL
Low target: 140 mg/dL
Low target: 140 mg/dL
Low target: 140 mg/dL
Low target: 140 mg/dL
Low target: 140 mg/dL
Low target: 140 mg/dL
Minutes until next BG: 60 min
Minutes until next BG: 60 min
Minutes until next BG: 60 min
Minutes until next BG: 60 min
Minutes until next BG: 60 min
Minutes until next BG: 60 min
Minutes until next BG: 60 min
Minutes until next BG: 60 min
Minutes until next BG: 60 min
Multiplier: 0.02
Multiplier: 0.01
Multiplier: 0.02
Multiplier: 0.01
Multiplier: 0.02
Multiplier: 0.02
Multiplier: 0.02
Multiplier: 0.01
Multiplier: 0

## 2015-04-06 LAB — DRUG SCREEN, URINE
AMPHETAMINES: NEGATIVE
BARBITURATES: NEGATIVE
BENZODIAZEPINES: NEGATIVE
COCAINE: POSITIVE — AB
METHADONE: NEGATIVE
OPIATES: NEGATIVE
PCP(PHENCYCLIDINE): NEGATIVE
THC (TH-CANNABINOL): NEGATIVE

## 2015-04-06 LAB — METABOLIC PANEL, COMPREHENSIVE
A-G Ratio: 1.1 (ref 0.8–1.7)
ALT (SGPT): 55 U/L (ref 16–61)
AST (SGOT): 27 U/L (ref 15–37)
Albumin: 3.5 g/dL (ref 3.4–5.0)
Alk. phosphatase: 28 U/L — ABNORMAL LOW (ref 45–117)
Anion gap: 11 mmol/L (ref 3.0–18)
BUN/Creatinine ratio: 10 — ABNORMAL LOW (ref 12–20)
BUN: 16 MG/DL (ref 7.0–18)
Bilirubin, total: 0.7 MG/DL (ref 0.2–1.0)
CO2: 25 mmol/L (ref 21–32)
Calcium: 8.2 MG/DL — ABNORMAL LOW (ref 8.5–10.1)
Chloride: 99 mmol/L — ABNORMAL LOW (ref 100–108)
Creatinine: 1.55 MG/DL — ABNORMAL HIGH (ref 0.6–1.3)
GFR est AA: 56 mL/min/{1.73_m2} — ABNORMAL LOW (ref >60)
GFR est non-AA: 46 mL/min/{1.73_m2} — ABNORMAL LOW (ref >60)
Globulin: 3.3 g/dL (ref 2.0–4.0)
Glucose: 544 mg/dL — CR (ref 74–99)
Potassium: 6.6 mmol/L — CR (ref 3.5–5.5)
Protein, total: 6.8 g/dL (ref 6.4–8.2)
Sodium: 135 mmol/L — ABNORMAL LOW (ref 136–145)

## 2015-04-06 LAB — METABOLIC PANEL, BASIC
Anion gap: 11 mmol/L (ref 3.0–18)
BUN/Creatinine ratio: 14 (ref 12–20)
BUN: 20 MG/DL — ABNORMAL HIGH (ref 7.0–18)
CO2: 24 mmol/L (ref 21–32)
Calcium: 8 MG/DL — ABNORMAL LOW (ref 8.5–10.1)
Chloride: 111 mmol/L — ABNORMAL HIGH (ref 100–108)
Creatinine: 1.41 MG/DL — ABNORMAL HIGH (ref 0.6–1.3)
GFR est AA: >60 mL/min/{1.73_m2} (ref >60)
GFR est non-AA: 51 mL/min/{1.73_m2} — ABNORMAL LOW (ref >60)
Glucose: 96 mg/dL (ref 74–99)
Potassium: 5.2 mmol/L (ref 3.5–5.5)
Sodium: 146 mmol/L — ABNORMAL HIGH (ref 136–145)

## 2015-04-06 LAB — URINALYSIS W/ RFLX MICROSCOPIC
Bilirubin: NEGATIVE
Blood: NEGATIVE
Glucose: >1000 mg/dL — AB
Ketone: 80 mg/dL — AB
Leukocyte Esterase: NEGATIVE
Nitrites: NEGATIVE
Protein: 30 mg/dL — AB
Specific gravity: >1.03 — ABNORMAL HIGH (ref 1.005–1.030)
Urobilinogen: 0.2 EU/dL (ref 0.2–1.0)
pH (UA): 5 (ref 5.0–8.0)

## 2015-04-06 LAB — POC CHEM8
Anion gap, POC: 19 (ref 10–20)
Anion gap, POC: 19 (ref 10–20)
BUN, POC: 16 MG/DL (ref 7–18)
BUN, POC: 16 MG/DL (ref 7–18)
CO2, POC: 25 MMOL/L — ABNORMAL HIGH (ref 19–24)
CO2, POC: 22 MMOL/L (ref 19–24)
Calcium, ionized (POC): 1.18 MMOL/L (ref 1.12–1.32)
Calcium, ionized (POC): 1.18 MMOL/L (ref 1.12–1.32)
Chloride, POC: 98 MMOL/L — ABNORMAL LOW (ref 100–108)
Chloride, POC: 102 MMOL/L (ref 100–108)
Creatinine, POC: 1.3 MG/DL (ref 0.6–1.3)
Creatinine, POC: 1.1 MG/DL (ref 0.6–1.3)
GFRAA, POC: >60 mL/min/{1.73_m2} (ref >60)
GFRAA, POC: >60 mL/min/{1.73_m2} (ref >60)
GFRNA, POC: 56 mL/min/{1.73_m2} — ABNORMAL LOW (ref >60)
GFRNA, POC: >60 mL/min/{1.73_m2} (ref >60)
Glucose, POC: 543 MG/DL — CR (ref 74–106)
Glucose, POC: 483 MG/DL — CR (ref 74–106)
Hematocrit, POC: 45 % (ref 36–49)
Hematocrit, POC: 42 % (ref 36–49)
Hemoglobin, POC: 15.3 G/DL (ref 12–16)
Hemoglobin, POC: 14.3 G/DL (ref 12–16)
Potassium, POC: 6.4 MMOL/L — CR (ref 3.5–5.5)
Potassium, POC: 6.1 MMOL/L — CR (ref 3.5–5.5)
Sodium, POC: 134 MMOL/L — ABNORMAL LOW (ref 136–145)
Sodium, POC: 136 MMOL/L (ref 136–145)

## 2015-04-06 LAB — BLOOD GAS, ARTERIAL POC
Base deficit (POC): 5 mmol/L
Base deficit (POC): 6 mmol/L
FIO2 (POC): 100 %
FIO2 (POC): 40 %
Flow rate (POC): 10 L/min
HCO3 (POC): 21.8 MMOL/L — ABNORMAL LOW (ref 22–26)
HCO3 (POC): 20.2 MMOL/L — ABNORMAL LOW (ref 22–26)
PEEP/CPAP (POC): 5 cmH2O
Set Rate: 14 {beats}/min
Tidal volume: 500 ml
Total resp. rate: 14
Total resp. rate: 14
pCO2 (POC): 45.5 MMHG — ABNORMAL HIGH (ref 35.0–45.0)
pCO2 (POC): 37.7 MMHG (ref 35.0–45.0)
pH (POC): 7.289 — ABNORMAL LOW (ref 7.35–7.45)
pH (POC): 7.337 — ABNORMAL LOW (ref 7.35–7.45)
pO2 (POC): 277 MMHG — ABNORMAL HIGH (ref 80–100)
pO2 (POC): 146 MMHG — ABNORMAL HIGH (ref 80–100)
sO2 (POC): 100 % — ABNORMAL HIGH (ref 92–97)
sO2 (POC): 99 % — ABNORMAL HIGH (ref 92–97)

## 2015-04-06 LAB — CBC WITH AUTOMATED DIFF
ABS. BASOPHILS: 0 10*3/uL (ref 0.0–0.06)
ABS. EOSINOPHILS: 0 10*3/uL (ref 0.0–0.4)
ABS. LYMPHOCYTES: 2.3 10*3/uL (ref 0.9–3.6)
ABS. MONOCYTES: 0.9 10*3/uL (ref 0.05–1.2)
ABS. NEUTROPHILS: 11.8 10*3/uL — ABNORMAL HIGH (ref 1.8–8.0)
BASOPHILS: 0 % (ref 0–2)
EOSINOPHILS: 0 % (ref 0–5)
HCT: 42.2 % (ref 36.0–48.0)
HGB: 13.9 g/dL (ref 13.0–16.0)
LYMPHOCYTES: 15 % — ABNORMAL LOW (ref 21–52)
MCH: 29.1 PG (ref 24.0–34.0)
MCHC: 32.9 g/dL (ref 31.0–37.0)
MCV: 88.5 FL (ref 74.0–97.0)
MONOCYTES: 6 % (ref 3–10)
MPV: 10.3 FL (ref 9.2–11.8)
NEUTROPHILS: 79 % — ABNORMAL HIGH (ref 40–73)
PLATELET: 212 10*3/uL (ref 135–420)
RBC: 4.77 M/uL (ref 4.70–5.50)
RDW: 13 % (ref 11.6–14.5)
WBC: 15.1 10*3/uL — ABNORMAL HIGH (ref 4.6–13.2)

## 2015-04-06 LAB — GLUCOSE, POC
Glucose (POC): 491 mg/dL — CR (ref 70–110)
Glucose (POC): 409 mg/dL — CR (ref 70–110)
Glucose (POC): 348 mg/dL — ABNORMAL HIGH (ref 70–110)
Glucose (POC): 251 mg/dL — ABNORMAL HIGH (ref 70–110)
Glucose (POC): 244 mg/dL — ABNORMAL HIGH (ref 70–110)
Glucose (POC): 170 mg/dL — ABNORMAL HIGH (ref 70–110)
Glucose (POC): 141 mg/dL — ABNORMAL HIGH (ref 70–110)
Glucose (POC): 102 mg/dL (ref 70–110)
Glucose (POC): 132 mg/dL — ABNORMAL HIGH (ref 70–110)

## 2015-04-06 LAB — HEMOGLOBIN A1C WITH EAG
Est. average glucose: 312 mg/dL
Hemoglobin A1c: 12.5 % — ABNORMAL HIGH (ref 4.5–5.6)

## 2015-04-06 LAB — EKG, 12 LEAD, INITIAL
Atrial Rate: 65 {beats}/min
Calculated P Axis: 68 degrees
Calculated R Axis: -75 degrees
Calculated T Axis: -46 degrees
P-R Interval: 248 ms
Q-T Interval: 424 ms
QRS Duration: 108 ms
QTC Calculation (Bezet): 440 ms
Ventricular Rate: 65 {beats}/min

## 2015-04-06 LAB — ACETAMINOPHEN: Acetaminophen level: <2 ug/mL — ABNORMAL LOW (ref 10–30)

## 2015-04-06 LAB — CARDIAC PANEL,(CK, CKMB & TROPONIN)
CK - MB: 1.8 ng/ml (ref 0.5–3.6)
CK - MB: 1.7 ng/ml (ref 0.5–3.6)
CK-MB Index: 0.9 % (ref 0.0–4.0)
CK-MB Index: 1.1 % (ref 0.0–4.0)
CK: 203 U/L (ref 39–308)
CK: 158 U/L (ref 39–308)
Troponin-I, QT: 0.03 NG/ML (ref 0.00–0.06)
Troponin-I, QT: 0.02 NG/ML (ref 0.00–0.06)

## 2015-04-06 LAB — URINE MICROSCOPIC ONLY

## 2015-04-06 LAB — PROTHROMBIN TIME + INR
INR: 1.2 (ref 0.8–1.2)
Prothrombin time: 15 s (ref 11.5–15.2)

## 2015-04-06 LAB — LACTIC ACID
Lactic acid: 2.7 MMOL/L — CR (ref 0.4–2.0)
Lactic acid: 3.4 MMOL/L — CR (ref 0.4–2.0)
Lactic acid: 2.5 MMOL/L — CR (ref 0.4–2.0)

## 2015-04-06 LAB — POC TROPONIN-I: Troponin-I (POC): <0.04 ng/mL (ref 0.00–0.08)

## 2015-04-06 LAB — PTT: aPTT: 23.8 s (ref 23.0–36.4)

## 2015-04-06 LAB — ETHYL ALCOHOL: ALCOHOL(ETHYL),SERUM: <3 MG/DL (ref 0–3)

## 2015-04-06 LAB — MAGNESIUM: Magnesium: 2.2 mg/dL (ref 1.8–2.4)

## 2015-04-06 LAB — SALICYLATE: Salicylate level: <2.8 MG/DL — ABNORMAL LOW (ref 2.8–20)

## 2015-04-06 MED ORDER — DEXTROSE 5% IN WATER (D5W) IV
1 mg/mL | INTRAVENOUS | Status: DC
Start: 2015-04-06 — End: 2015-04-08
  Administered 2015-04-06 – 2015-04-07 (×10): via INTRAVENOUS

## 2015-04-06 MED ORDER — SODIUM CHLORIDE 0.9 % IV
INTRAVENOUS | Status: DC
Start: 2015-04-06 — End: 2015-04-07
  Administered 2015-04-06 (×3): via INTRAVENOUS

## 2015-04-06 MED ORDER — SODIUM CHLORIDE 0.9% BOLUS IV
0.9 % | Freq: Once | INTRAVENOUS | Status: AC
Start: 2015-04-06 — End: 2015-04-06
  Administered 2015-04-06: 12:00:00 via INTRAVENOUS

## 2015-04-06 MED ORDER — ALBUMIN, HUMAN 25 % IV
25 % | Freq: Once | INTRAVENOUS | Status: AC
Start: 2015-04-06 — End: 2015-04-06
  Administered 2015-04-06: 16:00:00 via INTRAVENOUS

## 2015-04-06 MED ORDER — SODIUM CHLORIDE 0.9 % IJ SYRG
Freq: Three times a day (TID) | INTRAMUSCULAR | Status: DC
Start: 2015-04-06 — End: 2015-04-13
  Administered 2015-04-06 – 2015-04-13 (×24): via INTRAVENOUS

## 2015-04-06 MED ORDER — DEXTROSE 5% IN WATER (D5W) IV
1 mg/mL | INTRAVENOUS | Status: DC
Start: 2015-04-06 — End: 2015-04-06
  Administered 2015-04-06: 17:00:00 via INTRAVENOUS

## 2015-04-06 MED ORDER — NALOXONE 0.4 MG/ML INJECTION
0.4 mg/mL | INTRAMUSCULAR | Status: DC
Start: 2015-04-06 — End: 2015-04-10
  Administered 2015-04-06: 13:00:00 via INTRAVENOUS

## 2015-04-06 MED ORDER — INSULIN REGULAR HUMAN 100 UNIT/ML INJECTION
100 unit/mL | INTRAMUSCULAR | Status: DC
Start: 2015-04-06 — End: 2015-04-08
  Administered 2015-04-06 – 2015-04-07 (×19): via INTRAVENOUS

## 2015-04-06 MED ORDER — CALCIUM GLUCONATE 100 MG/ML (10%) IV SOLN
100 mg/mL (10%) | Freq: Once | INTRAVENOUS | Status: DC
Start: 2015-04-06 — End: 2015-04-06
  Administered 2015-04-06: 14:00:00 via INTRAVENOUS

## 2015-04-06 MED ORDER — CHLORHEXIDINE GLUCONATE 0.12 % MOUTHWASH
0.12 % | Freq: Two times a day (BID) | Status: DC
Start: 2015-04-06 — End: 2015-04-10
  Administered 2015-04-06 – 2015-04-10 (×9): via ORAL

## 2015-04-06 MED ORDER — SODIUM CHLORIDE 0.9% BOLUS IV
0.9 % | Freq: Once | INTRAVENOUS | Status: DC
Start: 2015-04-06 — End: 2015-04-06
  Administered 2015-04-06: 13:00:00 via INTRAVENOUS

## 2015-04-06 MED ORDER — PANTOPRAZOLE 40 MG IV SOLR
40 mg | INTRAVENOUS | Status: AC
Start: 2015-04-06 — End: 2015-04-06
  Administered 2015-04-06: 16:00:00 via INTRAVENOUS

## 2015-04-06 MED ORDER — DEXTROSE 50% IN WATER (D50W) IV SYRG
INTRAVENOUS | Status: DC | PRN
Start: 2015-04-06 — End: 2015-04-13

## 2015-04-06 MED ORDER — GLUCOSE 4 GRAM CHEWABLE TAB
4 gram | ORAL | Status: DC | PRN
Start: 2015-04-06 — End: 2015-04-13

## 2015-04-06 MED ORDER — SODIUM CHLORIDE 0.9 % IJ SYRG
INTRAMUSCULAR | Status: DC | PRN
Start: 2015-04-06 — End: 2015-04-13

## 2015-04-06 MED ORDER — LORAZEPAM 60 MG/60 ML (1 MG/ML) IN 0.9 % SODIUM CHLORIDE IV
60 mg/ mL (1 mg/mL) | INTRAVENOUS | Status: DC
Start: 2015-04-06 — End: 2015-04-08
  Administered 2015-04-06 – 2015-04-07 (×6): via INTRAVENOUS

## 2015-04-06 MED ORDER — ETOMIDATE 2 MG/ML IV SOLN
2 mg/mL | Freq: Once | INTRAVENOUS | Status: AC
Start: 2015-04-06 — End: 2015-04-06
  Administered 2015-04-06: 14:00:00 via INTRAVENOUS

## 2015-04-06 MED ORDER — DEXTROSE 5% IN NORMAL SALINE IV
INTRAVENOUS | Status: DC
Start: 2015-04-06 — End: 2015-04-07
  Administered 2015-04-06 – 2015-04-07 (×2): via INTRAVENOUS

## 2015-04-06 MED ORDER — SODIUM CHLORIDE 0.9 % IV
INTRAVENOUS | Status: DC
Start: 2015-04-06 — End: 2015-04-06
  Administered 2015-04-06: 16:00:00 via INTRAVENOUS

## 2015-04-06 MED ORDER — SUCCINYLCHOLINE CHLORIDE 20 MG/ML INJECTION
20 mg/mL | INTRAMUSCULAR | Status: AC
Start: 2015-04-06 — End: 2015-04-06
  Administered 2015-04-06: 15:00:00 via INTRAVENOUS

## 2015-04-06 MED ORDER — SODIUM CHLORIDE 0.9% BOLUS IV
0.9 % | Freq: Once | INTRAVENOUS | Status: AC
Start: 2015-04-06 — End: 2015-04-06
  Administered 2015-04-06: 15:00:00 via INTRAVENOUS

## 2015-04-06 MED ORDER — SODIUM CHLORIDE 0.9 % IV
100 mg/mL (10%) | Freq: Once | INTRAVENOUS | Status: AC
Start: 2015-04-06 — End: 2015-04-06
  Administered 2015-04-06: 15:00:00 via INTRAVENOUS

## 2015-04-06 MED ORDER — SODIUM POLYSTYRENE SULFONATE 15 G/60 ML ORAL SUSP
15 gram/60 mL | ORAL | Status: AC
Start: 2015-04-06 — End: 2015-04-06
  Administered 2015-04-06: 20:00:00 via ORAL

## 2015-04-06 MED ORDER — HEPARIN (PORCINE) 5,000 UNIT/ML IJ SOLN
5000 unit/mL | Freq: Three times a day (TID) | INTRAMUSCULAR | Status: DC
Start: 2015-04-06 — End: 2015-04-10
  Administered 2015-04-06 – 2015-04-10 (×13): via SUBCUTANEOUS

## 2015-04-06 MED ORDER — ASPIRIN 300 MG RECTAL SUPPOSITORY
300 mg | RECTAL | Status: AC
Start: 2015-04-06 — End: 2015-04-06
  Administered 2015-04-06: 15:00:00 via RECTAL

## 2015-04-06 MED ORDER — GLUCAGON 1 MG INJECTION
1 mg | INTRAMUSCULAR | Status: DC | PRN
Start: 2015-04-06 — End: 2015-04-13

## 2015-04-06 MED ORDER — FLUMAZENIL 0.1 MG/ML IV SOLN
0.1 mg/mL | INTRAVENOUS | Status: AC
Start: 2015-04-06 — End: 2015-04-06
  Administered 2015-04-06: 13:00:00 via INTRAVENOUS

## 2015-04-06 MED ORDER — NALOXONE 0.4 MG/ML INJECTION
0.4 mg/mL | INTRAMUSCULAR | Status: AC
Start: 2015-04-06 — End: 2015-04-06
  Administered 2015-04-06: 12:00:00 via INTRAVENOUS

## 2015-04-06 MED ORDER — IPRATROPIUM-ALBUTEROL 2.5 MG-0.5 MG/3 ML NEB SOLUTION
2.5 mg-0.5 mg/3 ml | RESPIRATORY_TRACT | Status: DC | PRN
Start: 2015-04-06 — End: 2015-04-13

## 2015-04-06 MED ORDER — LEVOFLOXACIN IN D5W 750 MG/150 ML IV PIGGY BACK
750 mg/150 mL | INTRAVENOUS | Status: AC
Start: 2015-04-06 — End: 2015-04-06
  Administered 2015-04-06: 15:00:00 via INTRAVENOUS

## 2015-04-06 MED FILL — CHLORHEXIDINE GLUCONATE 0.12 % MOUTHWASH: 0.12 % | Qty: 15

## 2015-04-06 MED FILL — NOREPINEPHRINE BITARTRATE 1 MG/ML IV: 1 mg/mL | INTRAVENOUS | Qty: 16

## 2015-04-06 MED FILL — SPS (WITH SORBITOL) 15 GRAM-20 GRAM/60 ML ORAL SUSPENSION: 15-20 gram/60 mL | ORAL | Qty: 60

## 2015-04-06 MED FILL — BD POSIFLUSH NORMAL SALINE 0.9 % INJECTION SYRINGE: INTRAMUSCULAR | Qty: 10

## 2015-04-06 MED FILL — SODIUM CHLORIDE 0.9 % IV: INTRAVENOUS | Qty: 1000

## 2015-04-06 MED FILL — LEVOFLOXACIN IN D5W 750 MG/150 ML IV PIGGY BACK: 750 mg/150 mL | INTRAVENOUS | Qty: 150

## 2015-04-06 MED FILL — LORAZEPAM 60 MG/60 ML (1 MG/ML) IN 0.9 % SODIUM CHLORIDE IV: 60 mg/ mL (1 mg/mL) | INTRAVENOUS | Qty: 60

## 2015-04-06 MED FILL — DEXTROSE 5% IN NORMAL SALINE IV: INTRAVENOUS | Qty: 1000

## 2015-04-06 MED FILL — NALOXONE 0.4 MG/ML INJECTION: 0.4 mg/mL | INTRAMUSCULAR | Qty: 2

## 2015-04-06 MED FILL — HUMULIN R REGULAR U-100 INSULIN 100 UNIT/ML INJECTION SOLUTION: 100 unit/mL | INTRAMUSCULAR | Qty: 3

## 2015-04-06 MED FILL — PROTONIX 40 MG INTRAVENOUS SOLUTION: 40 mg | INTRAVENOUS | Qty: 40

## 2015-04-06 MED FILL — ALBUMIN, HUMAN 25 % IV: 25 % | INTRAVENOUS | Qty: 50

## 2015-04-06 MED FILL — IPRATROPIUM-ALBUTEROL 2.5 MG-0.5 MG/3 ML NEB SOLUTION: 2.5 mg-0.5 mg/3 ml | RESPIRATORY_TRACT | Qty: 3

## 2015-04-06 MED FILL — FLUMAZENIL 0.1 MG/ML IV SOLN: 0.1 mg/mL | INTRAVENOUS | Qty: 5

## 2015-04-06 MED FILL — HEPARIN (PORCINE) 5,000 UNIT/ML IJ SOLN: 5000 unit/mL | INTRAMUSCULAR | Qty: 1

## 2015-04-06 MED FILL — CALCIUM GLUCONATE 100 MG/ML (10%) IV SOLN: 100 mg/mL (10%) | INTRAVENOUS | Qty: 10

## 2015-04-06 MED FILL — ASPIRIN 300 MG RECTAL SUPPOSITORY: 300 mg | RECTAL | Qty: 1

## 2015-04-06 MED FILL — NALOXONE 0.4 MG/ML INJECTION: 0.4 mg/mL | INTRAMUSCULAR | Qty: 1

## 2015-04-06 NOTE — Other (Addendum)
@  1420: admitted from ER via stretcher, on vent, insulin drip, levophed drip, ativan drip.  @1500 : Dr. Denyse Dago at bedside, stated SBP>90 to keep with levophed drip. Soft restraints both wrist to prevent pull line.  @1800 : lactic acid result informed to Dr. Denyse Dago,   He stated continue insulin drip over night, don't wean off drip.  @1900 :Bedside and Verbal shift change report given to Chilton Si RN (oncoming nurse) by TominagaRN (offgoing nurse). Report included the following information Kardex, ED Summary, Intake/Output, MAR, Recent Results and Cardiac Rhythm SR.Marland Kitchen

## 2015-04-06 NOTE — ED Notes (Addendum)
Patient arrived unresponsive by EMS from home residence. EMS reports wife found patient this morning. Narcan 2 mg x 2 doses administered by EMS. BVM assisted ventilations in progress upon arrival.   Sepsis Screening completed    (  )Patient meets SIRS criteria.  (x  )Patient does not meet SIRS criteria.      SIRS Criteria is achieved when two or more of the following are present  ? Temperature < 96.8??F (36??C) or > 100.9??F (38.3??C)  ? Heart Rate > 90 beats per minute  ? Respiratory Rate > 20 beats per minute  ? WBC count > 12,000 or <4,000 or > 10% bands      (  )Patient has a suspected source of infection.  (x  )Patient does not have a suspected source of infection.

## 2015-04-06 NOTE — ED Notes (Signed)
Dr. Lucianne Muss aware of current vital signs. No vasopressors to be given @ this time.

## 2015-04-06 NOTE — Progress Notes (Signed)
Spoke with Dr. Barbee Cough, patient's blood pressure dropped, he placed central line and started patient on levophed, levaquin. Follow lactic acid.

## 2015-04-06 NOTE — ED Notes (Signed)
Tammy from Poison Control updated on current vital signs, lab result, EKG's, current status. Per Tammy, QRS remains <120, may d/c serial EKG's @ this time.

## 2015-04-06 NOTE — ED Notes (Signed)
RT @ bedside for BIPAP.

## 2015-04-06 NOTE — Progress Notes (Addendum)
1920- Report and care received, assessment completed per flow sheet. Intubated and sedated. Becomes restless, agitated, and coughs excessively to verbal and noxious stimuli, settles down without intervention. IVF infusing via IV pumps without difficulty.  Soft bilateral wrist restraints in place to protect integrity of lines/tubes, flow sheet in progress.    2300- Reassessment completed and without change.    0400- Reassessment completed and without change.    7408- Report given to K.White Charity fundraiser.

## 2015-04-06 NOTE — ED Notes (Signed)
Lactic acid and cardiac enzymes hemolyzed per lab. Redrawn from central line and sent to lab.

## 2015-04-06 NOTE — ED Notes (Signed)
Dr. Barbee Cough and Dr. Denyse Dago @ bedside for consults. Patient gagging; BIPAP removed. Dr. Barbee Cough will intubate @ this time. Dr. Denyse Dago remains @ Bedside.  Beth, RT @ bedside for assistance.

## 2015-04-06 NOTE — Other (Signed)
TRANSFER - OUT REPORT:    Verbal report given to T. MacDonald, RN(name) on [employer]  being transferred to ICU for routine progression of care       Report consisted of patient???s Situation, Background, Assessment and   Recommendations(SBAR).     Information from the following report(s) SBAR, Kardex, ED Summary, Intake/Output, MAR, Recent Results and Cardiac Rhythm NSR with 1st degree block was reviewed with the receiving nurse.    Lines:   Peripheral IV 04/06/15 Right Antecubital (Active)   Site Assessment Clean, dry, & intact 04/06/2015  7:53 AM   Phlebitis Assessment 0 04/06/2015  7:53 AM   Infiltration Assessment 0 04/06/2015  7:53 AM   Dressing Type Transparent 04/06/2015  7:53 AM   Hub Color/Line Status Pink 04/06/2015  7:53 AM       Peripheral IV 04/06/15 Left Antecubital (Active)       Peripheral IV 04/06/15 Left;Lower Arm (Active)   Site Assessment Clean, dry, & intact 04/06/2015  7:54 AM   Phlebitis Assessment 0 04/06/2015  7:54 AM   Infiltration Assessment 0 04/06/2015  7:54 AM   Dressing Status Clean, dry, & intact 04/06/2015  7:54 AM   Dressing Type Transparent 04/06/2015  7:54 AM   Hub Color/Line Status Green;Flushed 04/06/2015  7:54 AM   Action Taken Blood drawn 04/06/2015  7:54 AM       Peripheral IV 04/06/15 Right Forearm (Active)   Site Assessment Clean, dry, & intact 04/06/2015  8:22 AM   Phlebitis Assessment 0 04/06/2015  8:22 AM   Infiltration Assessment 0 04/06/2015  8:22 AM   Dressing Status Clean, dry, & intact 04/06/2015  8:22 AM   Dressing Type Transparent 04/06/2015  8:22 AM   Hub Color/Line Status Flushed;Pink 04/06/2015  8:22 AM   Action Taken Blood drawn 04/06/2015  8:22 AM       Peripheral IV 04/06/15 Left Antecubital (Active)   Site Assessment Clean, dry, & intact 04/06/2015 11:25 AM   Phlebitis Assessment 0 04/06/2015 11:25 AM   Infiltration Assessment 0 04/06/2015 11:25 AM   Dressing Status Clean, dry, & intact 04/06/2015 11:25 AM   Dressing Type Transparent 04/06/2015 11:25 AM        Peripheral IV 04/06/15 Left Arm (Active)   Site Assessment Clean, dry, & intact 04/06/2015 11:26 AM   Phlebitis Assessment 0 04/06/2015 11:26 AM   Infiltration Assessment 0 04/06/2015 11:26 AM   Dressing Status Clean, dry, & intact 04/06/2015 11:26 AM   Dressing Type Transparent 04/06/2015 11:26 AM        Opportunity for questions and clarification was provided.      Patient transported with:   Monitor  Registered Nurse  Tech   Vent  RT  Handoff Sepsis Algorithm reviewed:    Time Blood Cultures Drawn   ??08:20  Time Initial Lactic Drawn:  ?? 07:55 Result: 2.7  @ 9:15AM  Time Next Lactic Acid Due: Now  Time target fluid bolus was initiated: 7:50AM  Time antibiotics started: 11:23AM      Sepsis Screening completed  (  )Patient meets SIRS criteria.  (  )Patient does not meet SIRS criteria.    SIRS Criteria is achieved when two or more of the following are present  ? Temperature < 96.8??F (36??C) or > 100.9??F (38.3??C)  ? Heart Rate > 90 beats per minute  ? Respiratory Rate > 20 beats per minute  ? WBC count > 12,000 or <4,000 or > 10% bands    (  )Patient  has a suspected source of infection.  (  )Patient does not have a suspected source of infection.    Time Severe Sepsis Met:  Severe Sepsis =  SIRS Criteria + Source of Infection + Organ Dysfunction   Organ Dysfunction is met when the patient has new onset:  ? SBP<90 or MAP<65 or decrease in SBP more than 40 points from baseline  ? Bilirubin>2  ? INR>1.5 or aPTT>60  ? Creatinine>2 or UO<0.5 ml/kg/hr for 2 hours  ? Platelet count < 100,000  ? Lactate >2  ? Acute Respiratory Failure   (BiPap/CPAP/Ventilator)    Time Septic Shock Met::  Septic Shock = Severe Sepsis + Any of the following  ? Initial Lactate > 4  ? Persistent hypotension after crystalloid fluid bolus (59m/kg) completion  o 2 or more readings of SBP<90, MAP<65, or SBP more than 40 points from baseline

## 2015-04-06 NOTE — Consults (Signed)
[facility]  Pulmonary, Critical Care, and Sleep Medicine    Initial Patient Consult    Name: Tracy [name] MRN: [id number]   DOB: 1955-11-07 Hospital: [facility]   Date: 04/06/2015        Subjective:     This patient has been seen and evaluated at the request of Dr. Lucianne Muss for respiratory depression with drug overdose.  . Patient is a 59 y.o. male with multiple medical problems inculding dm, cad s/p stent x3, mi, htn,   Has past hx of overdose including amytryptlin.   Brought to ED unresponsive with respiratory rate of 4.  BS at presentation of 521.  SBP of 80s.         Pt was given narcan x2  And 2L of NS.   SBP improved.  Respiratory rate improved.   However pupils remain pinpoint.   Unresponsive noxious stimuli.  Started on narcan drip and insulin drip.      I was called by Dr Lucianne Muss about admission to ICU.  Debated about bipap. Per Dr. Lucianne Muss he was more awake, so agreed to try the bipap.  Seen patient at ER.  Mental status waxing and weaning,  Almost vomited infront of me.  bipap was stopped immeidately.  To protect the airway electively decided to intubate the patient.    Advised to start the ativan drip and stop the narcan drip.        Past Medical History   Diagnosis Date   ??? CAD (coronary artery disease)    ??? Diabetes (HCC)    ??? MI (myocardial infarction) (HCC)    ??? Hypertension    ??? High cholesterol    ??? Chronic pain    ??? Arthritis       Past Surgical History   Procedure Laterality Date   ??? Pr cardiac surg procedure unlist     ??? Hx coronary stent placement        Prior to Admission medications    Medication Sig Start Date End Date Taking? Authorizing Provider   pravastatin (PRAVACHOL) 80 mg tablet Take 1 Tab by mouth nightly. 10/29/14   Mohammed Corbin Ade, MD   amLODIPine (NORVASC) 10 mg tablet Take 1 Tab by mouth daily. 10/29/14   Mohammed Corbin Ade, MD   insulin glargine (LANTUS SOLOSTAR) 100 unit/mL (3 mL) pen 8 Units by  SubCUTAneous route nightly. 10/29/14   Mohammed Corbin Ade, MD   oxyCODONE-acetaminophen (PERCOCET) 5-325 mg per tablet Take 1-2 Tabs by mouth every four (4) hours as needed for Pain. Max Daily Amount: 12 Tabs. 10/19/14   Frankey Poot, MD   pravastatin (PRAVACHOL) 80 mg tablet Take 80 mg by mouth nightly.    Historical Provider   docusate sodium (COLACE) 100 mg capsule Take 1 Cap by mouth two (2) times a day. 10/13/14   Junie Bame, PA   aspirin 81 mg chewable tablet Take 81 mg by mouth daily.    Phys Other, MD   clopidogrel (PLAVIX) 75 mg tablet Take 75 mg by mouth daily.    Phys Other, MD     No Known Allergies   History   Substance Use Topics   ??? Smoking status: Former Smoker   ??? Smokeless tobacco: Not on file   ??? Alcohol Use: Yes      Family History   Problem Relation Age of Onset   ??? Diabetes Mother    ??? Heart Disease Mother    ??? Diabetes Father  Current Facility-Administered Medications   Medication Dose Route Frequency   ??? insulin regular (NOVOLIN R, HUMULIN R) 100 Units in 0.9% sodium chloride 100 mL infusion  1-10 Units/hr IntraVENous TITRATE   ??? 0.9% sodium chloride infusion  150 mL/hr IntraVENous CONTINUOUS   ??? naloxone (NARCAN) 0.8 mg in 0.9% sodium chloride 500 mL infusion  0.4 mg/hr IntraVENous CONTINUOUS   ??? aspirin (ASA) suppository 300 mg  300 mg Rectal NOW   ??? calcium gluconate injection 1 g  1 g IntraVENous ONCE   ??? etomidate (AMIDATE) 2 mg/mL injection 16 mg  16 mg IntraVENous ONCE   ??? succinylcholine (ANECTINE) injection 120 mg  120 mg IntraVENous NOW   ??? LORazepam (ATIVAN) 1 mg/mL in NS 60 ml infusion  0.1-5 mg/hr IntraVENous TITRATE       Review of Systems:  Limited due to current status    Objective:   Vital Signs:    BP 86/56 mmHg   Pulse 64   Temp(Src) 97.9 ??F (36.6 ??C)   Resp 13   Wt 79.379 kg (175 lb)   SpO2 100%    O2 Device: BIPAP   O2 Flow Rate (L/min): 15 l/min   Temp (24hrs), Avg:97.9 ??F (36.6 ??C), Min:97.9 ??F (36.6 ??C), Max:97.9 ??F (36.6 ??C)       Intake/Output:    Last shift:         Last 3 shifts:    No intake or output data in the 24 hours ending 04/06/15 1029        Physical Exam:   General: comfortable intubated  HEENT;  Pupils are pinpoint but reactive  Neck: No adenopathy or thyroid swelling  CVS: S1S2 no murmurs  RS: Mod AE bilaterally, decreased BS with crackles at bases  Abd: soft, non tender, no hepatosplenomegaly  Neuro: non focal, awake, alert  Extrm: no leg edema   Skin: no rash  Lines/Tubes:  ETT       Data review:   Labs:  Recent Labs      04/06/15   0755   WBC  15.1*   HGB  13.9   HCT  42.2   PLT  212     Recent Labs      04/06/15   0755   NA  135*   K  6.6*   CL  99*   CO2  25   GLU  544*   BUN  16   CREA  1.55*   CA  8.2*   MG  2.2   ALB  3.5   SGOT  27   ALT  55   INR  1.2     No results for input(s): PH, PCO2, PO2, HCO3, FIO2 in the last 72 hours.    Imaging:  I have personally reviewed the patient???s radiographs and have reviewed the reports:     No Known Allergies   ??   Result Information    ?? Status Provider Status      ?? Final result (04/06/2015 ??8:55 AM) Open    ??   Study Result    ?? EXAM: CT head  ??  INDICATION: Headache and weakness  ??  COMPARISON: 08/28/2013  ??  TECHNIQUE: Axial CT imaging of the head was performed without intravenous  contrast.  ??  One or more dose reduction techniques were used on this CT: automated exposure  control, adjustment of the mAs and/or kVp according to patient's size, and  iterative reconstruction techniques. The specific techniques utilized  on this CT  exam have been documented in the patient's electronic medical record.  ??  ??_______________  ??  FINDINGS:  ??  BRAIN AND POSTERIOR FOSSA: There is minimal periventricular matter  hypoattenuation present. The ventricles are stable in size and configuration.  Basilar cisterns are patent. Physiologic basal ganglia calcifications noted on  the right. There is no intracranial hemorrhage, mass effect, or midline shift.  There are no areas of abnormal parenchymal attenuation.  ??   EXTRA-AXIAL SPACES AND MENINGES: There are no abnormal extra-axial fluid  collections.  ??  CALVARIUM: Intact.  ??  SINUSES: Mucosal thickening of the anterior and posterior ethmoid air cells is  noted.  ??  OTHER: Atherosclerotic calcification of bilateral carotid siphons present.  ??  _______________  ??  IMPRESSION:  ??  ??  1. No acute intracranial abnormality. Of note, noncontrast head CT can be normal  in the context of early acute stroke.  2. Minimal periventricular white matter hypoattenuation, which are nonspecific,  likely pertains to sequela of chronic ischemic microvascular disease.  ??     No Known Allergies   ??   Result Information    ?? Status Provider Status      ?? Final result (04/06/2015 ??9:46 AM) Open    ??   Study Result    ?? Chest, single view  ??  Indication: Shortness of breath, chest pain, patient found unresponsive.  ??  Comparison: Several prior exams, most recently 10/25/2014.  ??  Findings:?? Portable upright AP view of the chest was obtained. The patient is  rotated on the provided projection of the associated foreshortening of the left  hemithorax. Lungs are underexpanded with mild accentuation of contrast to the  markings. No focal pneumonic consolidation, pneumothorax, or large pleural  effusion. Cardiac size images and contours are stable. No acute osseous  normality. Calcific tendinosis of the left shoulder rotator cuff present,  unchanged.  ??  ??  ??  Impression:  Underexpanded lungs with associated bronchovascular crowding. No radiographic  evidence of acute pulmonary pulmonary abnormality.  ??            IMPRESSION:   ?? Acute respiratory failure on MV  ?? Hyperkalemia  ?? Drug overdose with cocaine and (possibly tricyclic give prior hx )  ?? Acute kidney injury due to volume depletion  ?? Hypotension due to above and voluem depletion  ?? Hyperosmolar Nonketotic hyperglycemia  ?? Hx of CAD  ?? Poorly controlled DM with HgA1c of 12.5      RECOMMENDATIONS:   ?? Keep sedation with RASS -2 to -3   ?? See vent orders  ?? Continue fluid bolus  ?? No need for pressors at this time  ?? emperic abx ok  ?? Continue insulin drip  ?? Foley for strict Is and Os  ?? Kayexlatate, agree with calcium gluconate  ?? DVT, PUD prophylaxis   ??   High complexity decision making was performed in this consultation and evaluation of this patient who is at high risk for decompensation with multiple organ involvement  Total critical care time spent rendering care exclusive of procedures: 50 minutes     Dahlia Client Sapphira Harjo, MD

## 2015-04-06 NOTE — Procedures (Addendum)
[facility]  Pulmonary, Critical Care, and Sleep Medicine    Name: Tracy [name] MRN: [id number]   DOB: 03/28/1956 Hospital: [facility]   Date: 04/06/2015        Intubation Note    Procedure: elective/emergent intubation    Indication: respiratory insufficiency    Anesthesia- Rapid sequence:  , Etomidate 16mg(0.5mg'kg) ,   Paralysis: Succinylcholine 120 mg(1mg/kg)    After assessing the airway, the patient underwent preoxygenation with 100% FiO2 for 5 min. Fentanyl was then given and after 3 min, etomidate and succnylcholine was given sequentially in rapid IV push.  The Sellick maneuver was performed throughout the entire sequence.  After adequate sedation and paralisys emergent intubation was performed.      A  4.0MAC,   blade  laryngoscope was used to visualize the epiglottis and vocal chords.    After positive identification of the vocal cords, an 8.0 ET tube was placed into the trachea with direct visualization.    The tube was seen passing through the vocal chords without / with some difficulty.  CO2 colorimetry was employed immediately to verify tube in airway with /without appropriate color change indicating detection/lack of CO2.    Water vapor was seen within the ET tube, and auscultation of the abdomen revealed no bubbling souds.    Auscultation  and inspection of the chest after intubation showed symmetric chest excursion and symmetric air entry bilaterally.    Chest X-ray has been ordered and is pending.    The patient has been placed on a mechanical ventilator.    There were no complications.    Yisrael Obryan S Victoria Euceda, MD

## 2015-04-06 NOTE — ED Notes (Signed)
Patient responds to painful stimuli. Requires frequent sternal rubs for brief periods of apnea. RT @ bedside for ABG's . O2 @ 15L/NRB in progress.

## 2015-04-06 NOTE — ED Notes (Signed)
Dr. Lucianne Muss remains @ bedside for central line placement. Aware of hypotension. Continue IVF's as ordered.

## 2015-04-06 NOTE — ED Provider Notes (Addendum)
HPI Comments:   7:48 AM   Tracy [name] is a 59 y.o. Male with a hx of DM, CAD, MI, cardiac stent placement x 3, chronic pain, hypertension, and drug overdose presenting via EMS to the ED for unresponsiveness. Per EMS, pt's fiance found him unconscious just PTA to ED. EMS reports pt's respiratory rate was ~4/minute, increased to 10/min ( past narcan), though was bagged,  FSBS was 742, systolic BP was 59-56L, and pulse ox was ~94-95%. They inserted a 20 gauge into the pt's right AC and administered 2 doses of Narcan w/  improved breathing. They states the pt's pupils were pinpoint and he did not respond to noxious stimuli, including ammonia. States the pt never lost a pulse but remained unresponsive the entire time. They bagged the pt d/t spontaneous respirations.   Upon arrival to  ER spont resp 14-18/min, w/o bagging sats 99-100%,  sbp 80-1000s, pt respond to tactile stimuli, grabbing iv site intermittently.     12:00 PM  Tracy [name], pt's fianc??, is at bedside. She reports that last night the pt was drinking with a friend of his and after friend left, he stayed up and talked with her. She then drifted off to sleep, awoke this morning around 5:30 AM, went to go to the bathroom, and noticed pt was laying on bedroom floor. She states she tried bringing him to, but states he was not responding. She does note, however, that his eyes were open and he had a gurgling sound in his voice. She then called EMS.        The history is provided by the EMS personnel and a relative (+ fiance). No language interpreter was used.   Written by Cyndia Bent, ED Scribe, as dictated by Sheralyn Boatman, MD      Past Medical History:   Diagnosis Date   ??? CAD (coronary artery disease)    ??? Diabetes ([place])    ??? MI (myocardial infarction) ([place])    ??? Hypertension    ??? High cholesterol    ??? Chronic pain    ??? Arthritis        Past Surgical History:   Procedure Laterality Date   ??? Pr cardiac surg procedure unlist     ??? Hx coronary stent placement            Family History:   Problem Relation Age of Onset   ??? Diabetes Mother    ??? Heart Disease Mother    ??? Diabetes Father        History     Social History   ??? Marital Status: SINGLE     Spouse Name: N/A   ??? Number of Children: N/A   ??? Years of Education: N/A     Occupational History   ??? Not on file.     Social History Main Topics   ??? Smoking status: Former Smoker   ??? Smokeless tobacco: Not on file   ??? Alcohol Use: Yes   ??? Drug Use: Not on file   ??? Sexual Activity: Not on file     Other Topics Concern   ??? Not on file     Social History Narrative         ALLERGIES: Review of patient's allergies indicates no known allergies.    Review of Systems   Unable to perform ROS: Patient unresponsive       Filed Vitals:    04/06/15 1245 04/06/15 1300 04/06/15 1315 04/06/15 1330   BP: 80/54  74/52 69/48 73/52    Pulse: 56 54 59 58   Temp:       Resp: 14 13 16 14    Weight:       SpO2: 100% 100% 97% 100%            Physical Exam   Constitutional: He appears well-developed and well-nourished.   Unresponsive, no obvious respiratory distress. Responds to painful/tactile stimuli.    HENT:   Head: Normocephalic and atraumatic.   Right Ear: External ear normal.   Left Ear: External ear normal.   Nose: Nose normal.   Mouth/Throat: Oropharynx is clear and moist.   Eyes: Right eye exhibits no discharge. Left eye exhibits no discharge.   Pupils are 1.5 mm, sluggish, and equal.    Neck: Normal range of motion. Neck supple. No JVD present. No tracheal deviation present. No thyromegaly present.   Cardiovascular: Normal rate, regular rhythm and intact distal pulses.  Exam reveals distant heart sounds.    Slightly distant heart sounds. BP is 101/81, pulse is 65 and regular with occasional PVCs.   Pulmonary/Chest: Effort normal. Bradypnea noted. No respiratory distress. He has decreased breath sounds (bilaterall).   No obvious respiratory distress but does have spontaneous respirations. Pt has np airway. Sats are 100% on FM.     Abdominal: Soft. Bowel sounds are normal. He exhibits no distension and no mass. There is no hepatosplenomegaly. There is no tenderness.   No HSM. Abdomen nontender but limited due to being unconscious.    Musculoskeletal: Normal range of motion. He exhibits no edema or tenderness.   Lymphadenopathy:     He has no cervical adenopathy.   Neurological: He is alert. He exhibits normal muscle tone.   Limited exam due to unresponsiveness. Responds to painful/tactile stimuli. Spontaneously moves all 4 extremities intermittently. No facial droop.    Skin: Skin is warm and dry.   Psychiatric: He has a normal mood and affect. His behavior is normal. Thought content normal.   Nursing note and vitals reviewed.     RESULTS:    EKG interpretation: (Preliminary)  7:51 AM   Vent rate 65 BPM, Sinus rhythm with 1st degree AV block, No STEMI  EKG read by Robbie Lis, MD    EKG interpretation: (Secondary)  10:32 AM   Vent rate 62 BPM, Sinus rhythm with 1st degree AV block, No STEMI   EKG read by Sheralyn Boatman, MD     EKG interpretation: (Tertiary)  11:44 AM   Vent rate 60 BPM, Sinus rhythm with 1st degree AV block, No STEMI   EKG read by Sheralyn Boatman, MD     RADIOLOGY FINDINGS  Chest X-ray shows nothing acute. Slightly rotated and hypoventilated.   Pending review by Radiologist  Recorded by Cyndia Bent, ED Scribe, as dictated by Sheralyn Boatman, MD     CT HEAD WO CONT   Final Result:  1. No acute intracranial abnormality. Of note, noncontrast head CT can be normal  in the context of early acute stroke.  2. Minimal periventricular white matter hypoattenuation, which are nonspecific,  likely pertains to sequela of chronic ischemic microvascular disease.  As read by the radiologist.       XR CHEST PORT   Final Result:  Underexpanded lungs with associated bronchovascular crowding. No radiographic  evidence of acute pulmonary pulmonary abnormality.  As read by the radiologist.          RADIOLOGY FINDINGS   Post-intubation chest x-ray shows that ET tube appears  to be in place. No obvious PTX.   Pending review by Radiologist  Recorded by Cyndia Bent, ED Scribe, as dictated by Sheralyn Boatman, MD      RADIOLOGY FINDINGS  Abdominal X-ray shows right femoral central line in place.   Pending review by Radiologist  Recorded by Cyndia Bent, ED Scribe, as dictated by Sheralyn Boatman, MD      Labs Reviewed   CBC WITH AUTOMATED DIFF - Abnormal; Notable for the following:     WBC 15.1 (*)     NEUTROPHILS 79 (*)     LYMPHOCYTES 15 (*)     ABS. NEUTROPHILS 11.8 (*)     All other components within normal limits   METABOLIC PANEL, COMPREHENSIVE - Abnormal; Notable for the following:     Sodium 135 (*)     Potassium 6.6 (*)     Chloride 99 (*)     Glucose 544 (*)     Creatinine 1.55 (*)     BUN/Creatinine ratio 10 (*)     GFR est AA 56 (*)     GFR est non-AA 46 (*)     Calcium 8.2 (*)     Alk. phosphatase 28 (*)     All other components within normal limits   URINALYSIS W/ RFLX MICROSCOPIC - Abnormal; Notable for the following:     Specific gravity >1.030 (*)     Protein 30 (*)     Glucose >1000 (*)     Ketone 80 (*)     All other components within normal limits   ACETAMINOPHEN - Abnormal; Notable for the following:     ACETAMINOPHEN <2 (*)     All other components within normal limits   SALICYLATE - Abnormal; Notable for the following:     SALICYLATE <8.4 (*)     All other components within normal limits   DRUG SCREEN, URINE - Abnormal; Notable for the following:     COCAINE POSITIVE (*)     All other components within normal limits   LACTIC ACID, PLASMA - Abnormal; Notable for the following:     Lactic acid 2.7 (*)     All other components within normal limits   HEMOGLOBIN A1C WITH EAG - Abnormal; Notable for the following:     Hemoglobin A1c 12.5 (*)     All other components within normal limits   URINE MICROSCOPIC ONLY - Abnormal; Notable for the following:     Bacteria FEW (*)     Mucus FEW (*)      All other components within normal limits   POC CHEM8 - Abnormal; Notable for the following:     CO2 (POC) 25 (*)     Glucose (POC) 543 (*)     GFR, non-AA (POC) 56 (*)     Sodium (POC) 134 (*)     Potassium (POC) 6.4 (*)     Chloride (POC) 98 (*)     All other components within normal limits   GLUCOSE, POC - Abnormal; Notable for the following:     Glucose (POC) 491 (*)     All other components within normal limits   POC G3 - Abnormal; Notable for the following:     pH (POC) 7.289 (*)     pCO2 (POC) 45.5 (*)     pO2 (POC) 277 (*)     HCO3 (POC) 21.8 (*)     sO2 (POC) 100 (*)     All other components within normal limits  POC CHEM8 - Abnormal; Notable for the following:     Glucose (POC) 483 (*)     Potassium (POC) 6.1 (*)     All other components within normal limits   GLUCOSE, POC - Abnormal; Notable for the following:     Glucose (POC) 409 (*)     All other components within normal limits   GLUCOSE, POC - Abnormal; Notable for the following:     Glucose (POC) 348 (*)     All other components within normal limits   GLUCOSE, POC - Abnormal; Notable for the following:     Glucose (POC) 251 (*)     All other components within normal limits   POC G3 - Abnormal; Notable for the following:     pH (POC) 7.337 (*)     pO2 (POC) 146 (*)     HCO3 (POC) 20.2 (*)     sO2 (POC) 99 (*)     All other components within normal limits   CULTURE, BLOOD   CARDIAC PANEL,(CK, CKMB & TROPONIN)   PROTHROMBIN TIME + INR   PTT   ETHYL ALCOHOL   MAGNESIUM   BLOOD GAS, ARTERIAL   GLUCOSTABILIZER   GLUCOSTABILIZER   GLUCOSTABILIZER   CARDIAC PANEL,(CK, CKMB & TROPONIN)   GLUCOSTABILIZER   LACTIC ACID, PLASMA   BLOOD GAS, ARTERIAL   POC TROPONIN-I   POC GLUCOSE   POC CHEM8   POC TROPONIN-I   POC GLUCOSE   POC GLUCOSE   POC GLUCOSE   POC GLUCOSE   POC CHEM8   POC GLUCOSE   POC GLUCOSE   POC GLUCOSE   POC GLUCOSE   POC GLUCOSE   POC GLUCOSE       Recent Results (from the past 12 hour(s))   EKG, 12 LEAD, INITIAL     Collection Time: 04/06/15  7:51 AM   Result Value Ref Range    Ventricular Rate 65 BPM    Atrial Rate 65 BPM    P-R Interval 248 ms    QRS Duration 108 ms    Q-T Interval 424 ms    QTC Calculation (Bezet) 440 ms    Calculated P Axis 68 degrees    Calculated R Axis -75 degrees    Calculated T Axis -46 degrees    Diagnosis       Sinus rhythm with 1st degree AV block  Incomplete right bundle branch block  Left anterior fascicular block  ST & T wave abnormality, consider inferior ischemia  Abnormal ECG  When compared with ECG of 25-Oct-2014 01:00,  PR interval has increased  Left anterior fascicular block is now present  Incomplete right bundle branch block is now present     CBC WITH AUTOMATED DIFF    Collection Time: 04/06/15  7:55 AM   Result Value Ref Range    WBC 15.1 (H) 4.6 - 13.2 K/uL    RBC 4.77 4.70 - 5.50 M/uL    HGB 13.9 13.0 - 16.0 g/dL    HCT 42.2 36.0 - 48.0 %    MCV 88.5 74.0 - 97.0 FL    MCH 29.1 24.0 - 34.0 PG    MCHC 32.9 31.0 - 37.0 g/dL    RDW 13.0 11.6 - 14.5 %    PLATELET 212 135 - 420 K/uL    MPV 10.3 9.2 - 11.8 FL    NEUTROPHILS 79 (H) 40 - 73 %    LYMPHOCYTES 15 (L) 21 - 52 %  MONOCYTES 6 3 - 10 %    EOSINOPHILS 0 0 - 5 %    BASOPHILS 0 0 - 2 %    ABS. NEUTROPHILS 11.8 (H) 1.8 - 8.0 K/UL    ABS. LYMPHOCYTES 2.3 0.9 - 3.6 K/UL    ABS. MONOCYTES 0.9 0.05 - 1.2 K/UL    ABS. EOSINOPHILS 0.0 0.0 - 0.4 K/UL    ABS. BASOPHILS 0.0 0.0 - 0.06 K/UL    DF AUTOMATED     METABOLIC PANEL, COMPREHENSIVE    Collection Time: 04/06/15  7:55 AM   Result Value Ref Range    Sodium 135 (L) 136 - 145 mmol/L    Potassium 6.6 (HH) 3.5 - 5.5 mmol/L    Chloride 99 (L) 100 - 108 mmol/L    CO2 25 21 - 32 mmol/L    Anion gap 11 3.0 - 18 mmol/L    Glucose 544 (HH) 74 - 99 mg/dL    BUN 16 7.0 - 18 MG/DL    Creatinine 1.55 (H) 0.6 - 1.3 MG/DL    BUN/Creatinine ratio 10 (L) 12 - 20      GFR est AA 56 (L) >60 ml/min/1.4m    GFR est non-AA 46 (L) >60 ml/min/1.720m   Calcium 8.2 (L) 8.5 - 10.1 MG/DL     Bilirubin, total 0.7 0.2 - 1.0 MG/DL    ALT 55 16 - 61 U/L    AST 27 15 - 37 U/L    Alk. phosphatase 28 (L) 45 - 117 U/L    Protein, total 6.8 6.4 - 8.2 g/dL    Albumin 3.5 3.4 - 5.0 g/dL    Globulin 3.3 2.0 - 4.0 g/dL    A-G Ratio 1.1 0.8 - 1.7     CARDIAC PANEL,(CK, CKMB & TROPONIN)    Collection Time: 04/06/15  7:55 AM   Result Value Ref Range    CK 203 39 - 308 U/L    CK - MB 1.8 0.5 - 3.6 ng/ml    CK-MB Index 0.9 0.0 - 4.0 %    Troponin-I, Qt. 0.03 0.00 - 0.06 NG/ML   PROTHROMBIN TIME + INR    Collection Time: 04/06/15  7:55 AM   Result Value Ref Range    Prothrombin time 15.0 11.5 - 15.2 sec    INR 1.2 0.8 - 1.2     PTT    Collection Time: 04/06/15  7:55 AM   Result Value Ref Range    aPTT 23.8 23.0 - 36.4 SEC   ACETAMINOPHEN    Collection Time: 04/06/15  7:55 AM   Result Value Ref Range    ACETAMINOPHEN <2 (L) 10 - 30 ug/mL   ETHYL ALCOHOL    Collection Time: 04/06/15  7:55 AM   Result Value Ref Range    ALCOHOL(ETHYL),SERUM <3 0 - 3 MG/DL   SALICYLATE    Collection Time: 04/06/15  7:55 AM   Result Value Ref Range    SALICYLATE <2<0.9L) 2.8 - 20 MG/DL   LACTIC ACID, PLASMA    Collection Time: 04/06/15  7:55 AM   Result Value Ref Range    Lactic acid 2.7 (HH) 0.4 - 2.0 MMOL/L   MAGNESIUM    Collection Time: 04/06/15  7:55 AM   Result Value Ref Range    Magnesium 2.2 1.8 - 2.4 mg/dL   HEMOGLOBIN A1C WITH EAG    Collection Time: 04/06/15  7:55 AM   Result Value Ref Range    Hemoglobin A1c 12.5 (  H) 4.5 - 5.6 %    Est. average glucose 312 mg/dL   POC CHEM8    Collection Time: 04/06/15  8:03 AM   Result Value Ref Range    CO2 (POC) 25 (H) 19 - 24 MMOL/L    Glucose (POC) 543 (HH) 74 - 106 MG/DL    BUN (POC) 16 7 - 18 MG/DL    Creatinine (POC) 1.3 0.6 - 1.3 MG/DL    GFR-AA (POC) >60 >60 ml/min/1.57m    GFR, non-AA (POC) 56 (L) >60 ml/min/1.710m   Sodium (POC) 134 (L) 136 - 145 MMOL/L    Potassium (POC) 6.4 (HH) 3.5 - 5.5 MMOL/L    Calcium, ionized (POC) 1.18 1.12 - 1.32 MMOL/L     Chloride (POC) 98 (L) 100 - 108 MMOL/L    Anion gap (POC) 19 10 - 20      Hematocrit (POC) 45 36 - 49 %    Hemoglobin (POC) 15.3 12 - 16 G/DL   POC TROPONIN-I    Collection Time: 04/06/15  8:04 AM   Result Value Ref Range    Troponin-I (POC) <0.04 0.00 - 0.08 ng/mL   URINALYSIS W/ RFLX MICROSCOPIC    Collection Time: 04/06/15  8:10 AM   Result Value Ref Range    Color YELLOW      Appearance CLEAR      Specific gravity >1.030 (H) 1.005 - 1.030    pH (UA) 5.0 5.0 - 8.0      Protein 30 (A) NEG mg/dL    Glucose >1000 (A) NEG mg/dL    Ketone 80 (A) NEG mg/dL    Bilirubin NEGATIVE  NEG      Blood NEGATIVE  NEG      Urobilinogen 0.2 0.2 - 1.0 EU/dL    Nitrites NEGATIVE  NEG      Leukocyte Esterase NEGATIVE  NEG     DRUG SCREEN, URINE    Collection Time: 04/06/15  8:10 AM   Result Value Ref Range    BENZODIAZEPINE NEGATIVE  NEG      BARBITURATES NEGATIVE  NEG      THC (TH-CANNABINOL) NEGATIVE  NEG      OPIATES NEGATIVE  NEG      PCP(PHENCYCLIDINE) NEGATIVE  NEG      COCAINE POSITIVE (A) NEG      AMPHETAMINE NEGATIVE  NEG      METHADONE NEGATIVE  NEG      HDSCOM (NOTE)    URINE MICROSCOPIC ONLY    Collection Time: 04/06/15  8:10 AM   Result Value Ref Range    WBC NONE 0 - 5 /hpf    RBC NONE 0 - 5 /hpf    Epithelial cells FEW 0 - 5 /lpf    Bacteria FEW (A) NEG /hpf    Mucus FEW (A) NEG /lpf   POC G3    Collection Time: 04/06/15  8:56 AM   Result Value Ref Range    Device: Non rebreather      Flow rate (POC) 10 L/M    FIO2 (POC) 100 %    pH (POC) 7.289 (L) 7.35 - 7.45      pCO2 (POC) 45.5 (H) 35.0 - 45.0 MMHG    pO2 (POC) 277 (H) 80 - 100 MMHG    HCO3 (POC) 21.8 (L) 22 - 26 MMOL/L    sO2 (POC) 100 (H) 92 - 97 %    Base deficit (POC) 5 mmol/L    Allens test (POC) YES  Total resp. rate 14      Site RIGHT RADIAL      Specimen type (POC) ARTERIAL      Performed by [place], POC    Collection Time: 04/06/15  8:58 AM   Result Value Ref Range    Glucose (POC) 491 (HH) 70 - 110 mg/dL   GLUCOSTABILIZER     Collection Time: 04/06/15  9:05 AM   Result Value Ref Range    Glucose 491 mg/dL    Insulin order 8.6 units/hour    Insulin adminstered 8.6 units/hour    Multiplier 0.020     Low target 140 mg/dL    High target 180 mg/dL    D50 order 0.0 ml    D50 administered 0.00 ml    Minutes until next BG 60 min    Order initials pmo     Administered initials pmio    POC CHEM8    Collection Time: 04/06/15  9:29 AM   Result Value Ref Range    CO2 (POC) 22 19 - 24 MMOL/L    Glucose (POC) 483 (HH) 74 - 106 MG/DL    BUN (POC) 16 7 - 18 MG/DL    Creatinine (POC) 1.1 0.6 - 1.3 MG/DL    GFR-AA (POC) >60 >60 ml/min/1.57m    GFR, non-AA (POC) >60 >60 ml/min/1.771m   Sodium (POC) 136 136 - 145 MMOL/L    Potassium (POC) 6.1 (HH) 3.5 - 5.5 MMOL/L    Calcium, ionized (POC) 1.18 1.12 - 1.32 MMOL/L    Chloride (POC) 102 100 - 108 MMOL/L    Anion gap (POC) 19 10 - 20      Hematocrit (POC) 42 36 - 49 %    Hemoglobin (POC) 14.3 12 - 16 G/DL   GLUCOSE, POC    Collection Time: 04/06/15 10:24 AM   Result Value Ref Range    Glucose (POC) 409 (HH) 70 - 110 mg/dL   GLUCOSTABILIZER    Collection Time: 04/06/15 10:25 AM   Result Value Ref Range    Glucose 409 mg/dL    Insulin order 3.5 units/hour    Insulin adminstered 3.5 units/hour    Multiplier 0.010     Low target 140 mg/dL    High target 180 mg/dL    D50 order 0.0 ml    D50 administered 0.00 ml    Minutes until next BG 60 min    Order initials pmo     Administered initials pmo    EKG, 12 LEAD, SUBSEQUENT    Collection Time: 04/06/15 10:32 AM   Result Value Ref Range    Ventricular Rate 62 BPM    Atrial Rate 62 BPM    P-R Interval 232 ms    QRS Duration 82 ms    Q-T Interval 424 ms    QTC Calculation (Bezet) 430 ms    Calculated P Axis 75 degrees    Calculated R Axis -11 degrees    Calculated T Axis -85 degrees    Diagnosis       Sinus rhythm with 1st degree AV block  Nonspecific T wave abnormality  Abnormal ECG  When compared with ECG of 06-Apr-2015 07:51,   Left anterior fascicular block is no longer present  Incomplete right bundle branch block is no longer present     GLUCOSE, POC    Collection Time: 04/06/15 11:33 AM   Result Value Ref Range    Glucose (POC) 348 (H) 70 -  110 mg/dL   GLUCOSTABILIZER    Collection Time: 04/06/15 11:34 AM   Result Value Ref Range    Glucose 348 mg/dL    Insulin order 5.8 units/hour    Insulin adminstered 5.8 units/hour    Multiplier 0.020     Low target 140 mg/dL    High target 180 mg/dL    D50 order 0.0 ml    D50 administered 0.00 ml    Minutes until next BG 60 min    Order initials pmo     Administered initials pmo    EKG, 12 LEAD, SUBSEQUENT    Collection Time: 04/06/15 11:44 AM   Result Value Ref Range    Ventricular Rate 60 BPM    Atrial Rate 60 BPM    P-R Interval 228 ms    QRS Duration 84 ms    Q-T Interval 446 ms    QTC Calculation (Bezet) 446 ms    Calculated P Axis 77 degrees    Calculated R Axis 0 degrees    Calculated T Axis -91 degrees    Diagnosis       Sinus rhythm with 1st degree AV block  Nonspecific T wave abnormality  Abnormal ECG  When compared with ECG of 06-Apr-2015 10:32,  No significant change was found     GLUCOSE, POC    Collection Time: 04/06/15 12:52 PM   Result Value Ref Range    Glucose (POC) 251 (H) 70 - 110 mg/dL   GLUCOSTABILIZER    Collection Time: 04/06/15 12:55 PM   Result Value Ref Range    Glucose 251 mg/dL    Insulin order 1.9 units/hour    Insulin adminstered 1.9 units/hour    Multiplier 0.010     Low target 140 mg/dL    High target 180 mg/dL    D50 order 0.0 ml    D50 administered 0.00 ml    Minutes until next BG 60 min    Order initials pmo     Administered initials pmo    POC G3    Collection Time: 04/06/15  1:07 PM   Result Value Ref Range    Device: VENT      FIO2 (POC) 40 %    pH (POC) 7.337 (L) 7.35 - 7.45      pCO2 (POC) 37.7 35.0 - 45.0 MMHG    pO2 (POC) 146 (H) 80 - 100 MMHG    HCO3 (POC) 20.2 (L) 22 - 26 MMOL/L    sO2 (POC) 99 (H) 92 - 97 %    Base deficit (POC) 6 mmol/L     Mode ASSIST CONTROL      Tidal volume 500 ml    Set Rate 14 bpm    PEEP/CPAP (POC) 5 cmH2O    Allens test (POC) YES      Total resp. 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  Initially had around 6 sec pause in respiration, then breathing rate 12-16/min spontaneously with intermittent waking up w/ iv insertion, foley insertion. Pt did have gag reflex, no foaming, bleeding, secretion orally.    Differential dx: dka, drug overdose,  acs/mi, stroke, cns bleed, anemia, electrolyte d/o, renal insuff, dehydration, infection, r/o ptx/pe/mass/abscess, toxins  Cardiac pathway, blood cxs, lactic acid, ct head, uds, etoh,  salicylate, tylenol levels, cxr, ekg ordered.  Pt given oxygen ivf ns, multiple bolus, iv levoquin, iv calcium gluconate, insulin stabilizer, narcan drip, rectal aspirin, later protonix iv and calcium gluconate iv.     D/w intensivist, hospitalist, and family member.    Repeat evaluations reveal pt mainly unresponsive though intermittent trying to pull off iv, pupils back to pin-point bil equal, pt maintaining airways and secretion, w/o resp distress, w/ sats 100% on nrbr fm, spont resp. sbp had dropped briefly to 80s. Pt op looks dry, no bleed on reeval, but pt not letting open mouth for further evaluation.    Delay in admission and pt remained in ER as pt was bumped off from his icu  bed, since another pt from PACU took his bed;    Brief trial of bipap per dr PBing Reerequest was made though pt deteriorated further requiring intubation. Meanwhile since pt initially since good peripheral access and no pressors, Dr PJamison Neighbor wanted to hold off on central line, w/ Dr MEarlie Serverat bedside as well. Since the patient's condition and bp further deteriorated, post intubation, central line was placed for pressor support, rt femoral, triple lumen.        Amount and/or Complexity of Data Reviewed  Clinical lab tests: ordered and reviewed  Tests in the radiology section of CPT??: ordered and reviewed (CXR, CT head without contrast, abdominal x-ray)  Tests in the medicine section of CPT??: reviewed and ordered (EKG)  Decide to obtain previous medical records or to obtain history from someone other than the patient: yes  Obtain history from someone other than the patient: yes (EMS personnel, DSharen Hones(next of kin), SBrayton Caves(fiance))  Review and summarize past medical records: yes  Discuss the patient with other providers: yes (Marlane Hatcher MD (Internal Medicine), Dr. [place]  Independent visualization of images, tracings, or specimens: yes (EKG, CXR, abdominal x-ray)    Risk of Complications, Morbidity, and/or Mortality  Presenting problems: high  Diagnostic procedures: high  Management options: high  General comments: Cc time 120 mins    Critical Care  Total time providing critical care: > 105 minutes (120)    MEDICATIONS GIVEN:  Medications   insulin regular (NOVOLIN R, HUMULIN R) 100 Units in 0.9% sodium chloride 100 mL infusion (1.9 Units/hr IntraVENous New Bag 04/06/15 1256)   glucose chewable tablet 16 g (not administered)   glucagon (GLUCAGEN) injection 1 mg (not administered)   dextrose (D50W) injection syrg 12.5-25 g (not administered)   0.9% sodium chloride infusion (0 mL/hr IntraVENous IV Completed 04/06/15 1135)    naloxone (NARCAN) 0.8 mg in 0.9% sodium chloride 500 mL infusion (0 mg/hr IntraVENous Stopped 04/06/15 1025)   LORazepam (ATIVAN) 1 mg/mL in NS 60 ml infusion (0 mg/hr IntraVENous Held 04/06/15 1315)   sodium polystyrene (KAYEXALATE) 15 gram/60 mL oral suspension 15 g (not administered)   chlorhexidine (PERIDEX) 0.12 % mouthwash 10 mL (not administered)   albuterol-ipratropium (DUO-NEB) 2.5 MG-0.5 MG/3 ML (not administered)   heparin (  porcine) injection 5,000 Units (not administered)   0.9% sodium chloride infusion (250 mL/hr IntraVENous Continued On Admission 04/06/15 1331)   NOREPINephrine (LEVOPHED) 16,000 mcg in dextrose 5% 250 mL infusion (not administered)   sodium chloride 0.9 % bolus infusion 1,000 mL (0 mL IntraVENous IV Completed 04/06/15 0900)   naloxone (NARCAN) injection 0.4 mg (0.4 mg IntraVENous Given 04/06/15 0809)   sodium chloride 0.9 % bolus infusion 1,000 mL (0 mL IntraVENous IV Completed 04/06/15 0845)   flumazenil (ROMAZICON) 0.1 mg/mL injection 0.4 mg (0.4 mg IntraVENous Given 04/06/15 0852)   aspirin (ASA) suppository 300 mg (300 mg Rectal Given 04/06/15 1042)   etomidate (AMIDATE) 2 mg/mL injection 16 mg (16 mg IntraVENous Given 04/06/15 1025)   succinylcholine (ANECTINE) injection 120 mg (120 mg IntraVENous Given 04/06/15 1036)   calcium gluconate 1 g in 0.9% sodium chloride 100 mL IVPB (0 g IntraVENous IV Completed 04/06/15 1200)   sodium chloride 0.9 % bolus infusion 1,000 mL (0 mL IntraVENous IV Completed 04/06/15 1200)   albumin human 25% (BUMINATE) solution 12.5 g (0 g IntraVENous IV Completed 04/06/15 1312)   pantoprazole (PROTONIX) injection 40 mg (40 mg IntraVENous Given 04/06/15 1131)   levofloxacin (LEVAQUIN) 750 mg in D5W IVPB (0 mg IntraVENous IV Completed 04/06/15 1300)        Central Line  Date/Time: 04/06/2015 12:30 PM  Performed by: attendingIndications: vascular access  Preparation: skin prepped with chlorhexidine  Pre-procedure re-eval: Immediately prior to the procedure, the patient was  reevaluated and found suitable for the planned procedure and any planned medications.  Time out: Immediately prior to the procedure a time out was called to verify the correct patient, procedure, equipment, staff and marking as appropriate..  Location details: right femoral  Anesthesia: local infiltration  Local anesthetic: lidocaine 1% without epinephrine  Anesthetic total (ml): 4.  Patient position: flat  Catheter type: triple lumen  Pre-procedure: landmarks identified  Ultrasound guidance: no  Number of attempts: 3  Successful placement: yes  Post-procedure: line sutured and dressing applied  Assessment: blood return through all parts  Patient tolerance: Patient tolerated the procedure well with no immediate complications  My total time at bedside, performing this procedure was 16-30 minutes.          PROGRESS NOTE:   7:48 AM  Initial assessment performed.  Written by Cyndia Bent, ED Scribe, as dictated by Sheralyn Boatman, MD     PROGRESS NOTE:   8:05 AM  Pt has been re-examined by Robbie Lis, MD. BP dropped to 80s and pupils are pinpoint. Will order Narcan drip, Flumazenil, and more fluids.  Written by Cyndia Bent, ED Scribe, as dictated by Sheralyn Boatman, MD      CONSULT NOTE:   8:20 AM  Robbie Lis, MD spoke with pt's sister and next of kin, Sharen Hones, over the telephone. Discussed pt's case. Reviewed care plans. Butch Penny, who is in [place] working, states she last talked to the pt 2 days ago. She states he's been depressed lately, has a hx of medical issues with his MI, and had recent surgery few months ago for a perirectal abscess. She states he has also had financial troubles. I explained to her about what's happening and she said if we need to intubate we can even though the pt may not like it. There was no DNR order.   Written by Rivka Barbara, ED Scribe, as dictated by Robbie Lis, MD.     PROGRESS NOTE:   9:01 AM  Pt has been re-examined by Robbie Lis, MD. Pt still unresponsive but  does have a partial gag reflex and is able to turn his head. Hospitalist is being consulted.   Written by Cyndia Bent, ED Scribe, as dictated by Sheralyn Boatman, MD      CONSULT NOTE:   9:09 AM   Sheralyn Boatman, MD spoke with Dr. Belva Chimes   Specialty: Intensivist  Discussed pt's hx, disposition, and available diagnostic and imaging results over the telephone. Reviewed care plans. He recommends place pt on bipap and repeat potassium level.   Written by Rivka Barbara, ED Scribe, as dictated by Sheralyn Boatman, MD.     CONSULT NOTE:   9:13 AM  Sheralyn Boatman, MD spoke with Marlane Hatcher, MD    Specialty: Internal Medicine  Discussed pt's hx, disposition, and available diagnostic and imaging results over the telephone. Reviewed care plans. He agrees to admit the pt to the ICU and recommends consulting Poison Control.   Written by Bronwen Betters, ED Scribe, as dictated by Sheralyn Boatman, MD.     ADMISSION NOTE:  9:13 AM  Patient is being admitted to the hospital by Marlane Hatcher, MD. The results of their tests and reasons for their admission have been discussed with them and/or available family. They convey agreement and understanding for the need to be admitted and for their admission diagnosis.    Written by Bronwen Betters, ED Scribe, as dictated by Sheralyn Boatman, MD.    CONDITIONS ON ADMISSION:  Pneumonia is not present at the time of admission. MRSA is not present at the time of admission. Wound infection is not present at the time of admission. Pressure Ulcer is not present at the time of admission.     PROGRESS NOTE:   10:08 AM  Dr. Belva Chimes and Dr. Marlane Hatcher are at bedside. They will intubate pt as pt now rr down to <8/10, more unresponisve, in order to protect airway. Dr. Jamison Neighbor is requesting 120 mg Succinate IV and 16 mg Etomidate.   Written by Cyndia Bent, ED Scribe, as dictated by Sheralyn Boatman, MD      PROGRESS NOTE:   10:24 AM  Dr. Jamison Neighbor and Dr. Marlane Hatcher are requesting that I order an Ativan drip  and post-intubation x-ray, though aware sbp 80-90s after almost 3l of ivf ns..  Written by Cyndia Bent, ED Scribe, as dictated by Sheralyn Boatman, MD     CONSULT NOTE:   11:05 AM  Sheralyn Boatman, MD spoke with Dr. Belva Chimes   Specialty: Intensivist  I discussed concern for BP in the 54O systolic. He recommends another liter IVF and Albumin but states to hold off on pressors and advises continuing admission to ICU. He recommends not inserting a central line.   Written by Bronwen Betters, ED Scribe, as dictated by Sheralyn Boatman, MD.      PROGRESS NOTE:   11:35 AM  Sister called back. I updated her and she states she???s flying up to see the pt this afternoon.   Written by Cyndia Bent, ED Scribe, as dictated by Sheralyn Boatman, MD       CLINICAL IMPRESSION    1. Unresponsive    2. Drug abuse    3. 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 Abnormal EKG         Attestations:  This note is prepared by Cyndia Bent, acting as Scribe for Sheralyn Boatman, MD  This note is prepared by Rivka Barbara, acting as Scribe for Sheralyn Boatman, MD     Sheralyn Boatman, MD: The scribe's documentation has been prepared under my direction and personally reviewed by me in its entirety. I confirm that the note above accurately reflects all work, treatment, procedures, and medical decision making performed by me.    9:19 AM  I have spent 180 minutes of critical care time involved in lab review, consultations with specialist, family decision-making, and documentation.  During this entire length of time I was immediately available to the patient.    Critical Care:  The reason for providing this level of medical care for this critically ill patient was due a critical illness that impaired one or more vital organ systems such that there was a high probability of imminent or life threatening deterioration in the patients condition. This care involved high complexity decision making to assess, manipulate, and  support vital system functions, to treat this degreee vital organ system failure and to prevent further life threatening deterioration of the patient???s condition.

## 2015-04-06 NOTE — ED Notes (Signed)
Tammy @ Poison Control contacted; Concern for TCA overdose as seen in 2014. Recommends serial EKG's q1h x2-3 hours. If QRS> 120 with hypotension, bolus with bicarb. Not recommended to start bicarb drip for TCA overdose. Continue supportive care; seizure precautions.

## 2015-04-06 NOTE — ED Notes (Signed)
Dr. Lucianne Muss @ bedside for central line placement.

## 2015-04-06 NOTE — Progress Notes (Addendum)
Patient found unresponsive at home. Was Ambu bagged and given narcan in the field. Received patient ER with erratic breathing. Patient ambubagged for about 5 minutes and then placed on 2lpm nc once patient breathing RR 22 with a Spo2 of 98%. Patient occasionally still has apneic periods, but will start breathing again when painful stimuli given. Patient will be given another dose of narcan by nursing.  0900 patient found on NRB. ABG drawn, then patient placed back on 2lpm nc post results. Patient still having episodes of apnea.

## 2015-04-06 NOTE — ED Notes (Signed)
POC chem8 completed, glucose 543; K 6.4. Dr. Lucianne Muss given results.

## 2015-04-06 NOTE — ED Notes (Addendum)
Discussed hypotension with Dr. Lucianne Muss. Per Dr. Lucianne Muss, bolus with 1L NS for hypotension @ this time. Vasopressors not be given @ this time. Dr. Lucianne Muss reassessed tissue perfusion per sepsis protocol.

## 2015-04-06 NOTE — H&P (Signed)
History & Physical    Patient: Tracy [name] MRN: [id number]  CSN: [id number]    Date of Birth: 1956-10-13  Age: 60 y.o.  Sex: male      DOA: 04/06/2015  Primary Care Provider:  Phys Other, MD      Assessment/Plan     Patient Active Problem List   Diagnosis Code   ??? Drug overdose T50.901A   ??? Uncontrolled type II diabetes mellitus (HCC) E11.65   ??? Unresponsive R41.89   ??? Acute respiratory failure (HCC) J96.00   ??? CAD (coronary artery disease) I25.10   ??? Hypertension I10   ??? Hyperkalemia E87.5   ??? AKI (acute kidney injury) (HCC) N17.9   ??? Hyperglycemia due to type 2 diabetes mellitus (HCC) E11.65   ??? Encephalopathy G93.40   ??? Cocaine abuse F14.10       Admit to ICU  Acute respiratory failure - intubated to protect the airway.   Unresponsive/encephalopathy - secondary to drug overdose  Drug overdose - UDS positive for cocaine, past history of amitriptyline overdose. Will keep on monitor, follow up EKG.  hypergylcemia - on glucose stabilizer, poorly controlled DM.  AKI - pre renal, on IVF  Hyperkalemia - secondary to hyperglycemia, should improve with correction on insulin, calcium gluconate given in ER, pulm ordered kayexalate.  CAD - with history of stent placement.  Empiric antibiotic with levaquin.  DVT prophylaxis with heparin  GI prophylaxis with protonix  70 minutes of critical care time spent in the direct evaluation and treatment of this high risk patient. The reason for providing this level of medical care for this critically ill patient was due a critical illness that impaired one or more vital organ systems such that there was a high probability of imminent or life threatening deterioration in the patients condition. This care involved high complexity decision making to assess, manipulate, and support vital system functions, to treat this degreee vital organ system failure and to prevent further life threatening deterioration of the patient???s condition.           CC: unresponsive       HPI:      Tracy [name] is a 59 y.o. male who has past history of CAD, stent placement, DM, HTN, chronic pain brought to ER by EMS secondary to unresponsive state. History obtained from chart. Per reports patient's Fiance found him unconscious and EMS reported his respiratory rate was about 4/min. He was given narcan and his respiratory rate improved to 10/min, he was bagged and his FSBS 521, his systolic BP in 80s. Patient was brought to ER. He has history of amitriptyline overdose in the past.   In ER he was somewhat awake and was put on BiPAP. His urine drug screen positive for cocaine. Wbc elevated at 15.1, potassium at 6.6, blood glucose at 544, HbA1c of 12.5, creatine at 1.55, lactic acid at 2.7, his head CT with no acute changes. ABG 7.289/45.5/277/21.8/100 on NRB I saw the patient in ER along with intensivist Dr. Barbee Cough. Patient had waxing and weaning mental status, he almost vomited. It was decided to intubate him to protect the airway, I was present during the time, no complications. Patient will be admitted to ICU.     Past Medical History   Diagnosis Date   ??? CAD (coronary artery disease)    ??? Diabetes (HCC)    ??? MI (myocardial infarction) (HCC)    ??? Hypertension    ??? High cholesterol    ??? Chronic pain    ???  Arthritis        Past Surgical History   Procedure Laterality Date   ??? Pr cardiac surg procedure unlist     ??? Hx coronary stent placement         Family History   Problem Relation Age of Onset   ??? Diabetes Mother    ??? Heart Disease Mother    ??? Diabetes Father        History     Social History   ??? Marital Status: SINGLE     Spouse Name: N/A   ??? Number of Children: N/A   ??? Years of Education: N/A     Social History Main Topics   ??? Smoking status: Former Smoker   ??? Smokeless tobacco: Not on file   ??? Alcohol Use: Yes   ??? Drug Use: Not on file   ??? Sexual Activity: Not on file     Other Topics Concern   ??? Not on file     Social History Narrative       Prior to Admission medications     Medication Sig Start Date End Date Taking? Authorizing Provider   pravastatin (PRAVACHOL) 80 mg tablet Take 1 Tab by mouth nightly. 10/29/14   Jazma Pickel Corbin Ade, MD   amLODIPine (NORVASC) 10 mg tablet Take 1 Tab by mouth daily. 10/29/14   Shikara Mcauliffe Corbin Ade, MD   insulin glargine (LANTUS SOLOSTAR) 100 unit/mL (3 mL) pen 8 Units by SubCUTAneous route nightly. 10/29/14   Davonna Ertl Corbin Ade, MD   oxyCODONE-acetaminophen (PERCOCET) 5-325 mg per tablet Take 1-2 Tabs by mouth every four (4) hours as needed for Pain. Max Daily Amount: 12 Tabs. 10/19/14   Frankey Poot, MD   pravastatin (PRAVACHOL) 80 mg tablet Take 80 mg by mouth nightly.    Historical Provider   docusate sodium (COLACE) 100 mg capsule Take 1 Cap by mouth two (2) times a day. 10/13/14   Junie Bame, PA   aspirin 81 mg chewable tablet Take 81 mg by mouth daily.    Phys Other, MD   clopidogrel (PLAVIX) 75 mg tablet Take 75 mg by mouth daily.    Phys Other, MD       No Known Allergies    Review of Systems  Unable to obtain ROS        Physical Exam:     Physical Exam:  BP 80/52 mmHg   Pulse 57   Temp(Src) 97.9 ??F (36.6 ??C)   Resp 14   Wt 79.379 kg (175 lb)   SpO2 100% O2 Flow Rate (L/min): 15 l/min O2 Device: BIPAP    Temp (24hrs), Avg:97.9 ??F (36.6 ??C), Min:97.9 ??F (36.6 ??C), Max:97.9 ??F (36.6 ??C)    06/15 0701 - 06/15 1900  In: -   Out: 550 [Urine:550]        General:  As above.   Head:  Normocephalic, without obvious abnormality, atraumatic.   Eyes:  Conjunctivae/corneas clear, sclera anicteric, pupils pin point.   Nose: Nares normal. No drainage or sinus tenderness.   Throat: Lips, mucosa, and tongue normal.    Neck: Supple, symmetrical, trachea midline, no adenopathy.       Lungs:   Clear to auscultation bilaterally.       Heart:  Regular rate and rhythm, S1, S2 normal, no murmur, click, rub or gallop.     Abdomen: Soft, non-tender. Bowel sounds normal. No masses,  No organomegaly.    Extremities: Extremities normal, atraumatic, no cyanosis or edema.  Capillary refill normal.   Pulses: 2+ and symmetric all extremities.   Skin: Skin color pink, turgor normal. No rashes or lesions   Neurologic: Not tested       Labs Reviewed:    CMP:   Lab Results   Component Value Date/Time    NA 135* 04/06/2015 07:55 AM    K 6.6* 04/06/2015 07:55 AM    CL 99* 04/06/2015 07:55 AM    CO2 25 04/06/2015 07:55 AM    AGAP 11 04/06/2015 07:55 AM    GLU 544* 04/06/2015 07:55 AM    BUN 16 04/06/2015 07:55 AM    CREA 1.55* 04/06/2015 07:55 AM    GFRAA 56* 04/06/2015 07:55 AM    GFRNA 46* 04/06/2015 07:55 AM    CA 8.2* 04/06/2015 07:55 AM    MG 2.2 04/06/2015 07:55 AM    ALB 3.5 04/06/2015 07:55 AM    TP 6.8 04/06/2015 07:55 AM    GLOB 3.3 04/06/2015 07:55 AM    AGRAT 1.1 04/06/2015 07:55 AM    SGOT 27 04/06/2015 07:55 AM    ALT 55 04/06/2015 07:55 AM     CBC:   Lab Results   Component Value Date/Time    WBC 15.1* 04/06/2015 07:55 AM    HGB 13.9 04/06/2015 07:55 AM    HCT 42.2 04/06/2015 07:55 AM    PLT 212 04/06/2015 07:55 AM     All Cardiac Markers in the last 24 hours:   Lab Results   Component Value Date/Time    CPK 203 04/06/2015 07:55 AM    CKMB 1.8 04/06/2015 07:55 AM    CKND1 0.9 04/06/2015 07:55 AM    TROIQ 0.03 04/06/2015 07:55 AM    TNIPOC <0.04 04/06/2015 08:04 AM     ABG:   Lab Results   Component Value Date/Time    PHI 7.337* 04/06/2015 01:07 PM    PCO2I 37.7 04/06/2015 01:07 PM    PO2I 146* 04/06/2015 01:07 PM    HCO3I 20.2* 04/06/2015 01:07 PM    FIO2I 40 04/06/2015 01:07 PM         Procedures/imaging: see electronic medical records for all procedures/Xrays and details which were not copied into this note but were reviewed prior to creation of Plan        CC: Phys Other, MD

## 2015-04-06 NOTE — Procedures (Signed)
[facility]  Pulmonary, Critical Care, and Sleep Medicine    Name: Tracy [name] MRN: [id number]   DOB: 07-26-1956 Hospital: [facility]   Date: 04/06/2015        Intubation Note    Procedure: elective/emergent intubation    Indication: respiratory insufficiency    Anesthesia- Rapid sequence:  , Etomidate 16mg (0.5mg 'kg) ,   Paralysis: Succinylcholine 120 mg(1mg /kg)    After assessing the airway, the patient underwent preoxygenation with 100% FiO2 for 5 min. Fentanyl was then given and after 3 min, etomidate and succnylcholine was given sequentially in rapid IV push.  The Sellick maneuver was performed throughout the entire sequence.  After adequate sedation and paralisys emergent intubation was performed.      A  4.0MAC,   blade  laryngoscope was used to visualize the epiglottis and vocal chords.    After positive identification of the vocal cords, an 8.0 ET tube was placed into the trachea with direct visualization.    The tube was seen passing through the vocal chords without / with some difficulty.  CO2 colorimetry was employed immediately to verify tube in airway with /without appropriate color change indicating detection/lack of CO2.    Water vapor was seen within the ET tube, and auscultation of the abdomen revealed no bubbling souds.    Auscultation  and inspection of the chest after intubation showed symmetric chest excursion and symmetric air entry bilaterally.    Chest X-ray has been ordered and is pending.    The patient has been placed on a mechanical ventilator.    There were no complications.    Dahlia Client PAK, MD

## 2015-04-07 ENCOUNTER — Inpatient Hospital Stay: Admit: 2015-04-07 | Payer: MEDICARE | Primary: Family Medicine

## 2015-04-07 LAB — GLUCOSTABILIZER
D50 administered: 0 ml
D50 administered: 0 ml
D50 administered: 0 ml
D50 administered: 0 ml
D50 administered: 0 ml
D50 administered: 0 ml
D50 administered: 0 ml
D50 administered: 0 ml
D50 administered: 0 ml
D50 administered: 0 ml
D50 administered: 0 ml
D50 administered: 0 ml
D50 administered: 0 ml
D50 administered: 0 ml
D50 administered: 0 ml
D50 administered: 0 ml
D50 administered: 0 ml
D50 order: 0 ml
D50 order: 0 ml
D50 order: 0 ml
D50 order: 0 ml
D50 order: 0 ml
D50 order: 0 ml
D50 order: 0 ml
D50 order: 0 ml
D50 order: 0 ml
D50 order: 0 ml
D50 order: 0 ml
D50 order: 0 ml
D50 order: 0 ml
D50 order: 0 ml
D50 order: 0 ml
D50 order: 0 ml
D50 order: 0 ml
Glucose: 101 mg/dL
Glucose: 119 mg/dL
Glucose: 126 mg/dL
Glucose: 128 mg/dL
Glucose: 146 mg/dL
Glucose: 161 mg/dL
Glucose: 165 mg/dL
Glucose: 167 mg/dL
Glucose: 169 mg/dL
Glucose: 172 mg/dL
Glucose: 181 mg/dL
Glucose: 185 mg/dL
Glucose: 194 mg/dL
Glucose: 201 mg/dL
Glucose: 211 mg/dL
Glucose: 230 mg/dL
Glucose: 83 mg/dL
High target: 180 mg/dL
High target: 180 mg/dL
High target: 180 mg/dL
High target: 180 mg/dL
High target: 180 mg/dL
High target: 180 mg/dL
High target: 180 mg/dL
High target: 180 mg/dL
High target: 180 mg/dL
High target: 180 mg/dL
High target: 180 mg/dL
High target: 180 mg/dL
High target: 180 mg/dL
High target: 180 mg/dL
High target: 180 mg/dL
High target: 180 mg/dL
High target: 180 mg/dL
Insulin adminstered: 0 units/hour
Insulin adminstered: 0 units/hour
Insulin adminstered: 0 units/hour
Insulin adminstered: 0 units/hour
Insulin adminstered: 0 units/hour
Insulin adminstered: 0.2 units/hour
Insulin adminstered: 1.2 units/hour
Insulin adminstered: 1.3 units/hour
Insulin adminstered: 1.4 units/hour
Insulin adminstered: 2 units/hour
Insulin adminstered: 2.1 units/hour
Insulin adminstered: 2.2 units/hour
Insulin adminstered: 2.4 units/hour
Insulin adminstered: 3 units/hour
Insulin adminstered: 4.2 units/hour
Insulin adminstered: 5.1 units/hour
Insulin adminstered: 5.4 units/hour
Insulin order: 0 units/hour
Insulin order: 0 units/hour
Insulin order: 0 units/hour
Insulin order: 0 units/hour
Insulin order: 0 units/hour
Insulin order: 0.2 units/hour
Insulin order: 1.2 units/hour
Insulin order: 1.3 units/hour
Insulin order: 1.4 units/hour
Insulin order: 2 units/hour
Insulin order: 2.1 units/hour
Insulin order: 2.2 units/hour
Insulin order: 2.4 units/hour
Insulin order: 3 units/hour
Insulin order: 4.2 units/hour
Insulin order: 5.1 units/hour
Insulin order: 5.4 units/hour
Low target: 140 mg/dL
Low target: 140 mg/dL
Low target: 140 mg/dL
Low target: 140 mg/dL
Low target: 140 mg/dL
Low target: 140 mg/dL
Low target: 140 mg/dL
Low target: 140 mg/dL
Low target: 140 mg/dL
Low target: 140 mg/dL
Low target: 140 mg/dL
Low target: 140 mg/dL
Low target: 140 mg/dL
Low target: 140 mg/dL
Low target: 140 mg/dL
Low target: 140 mg/dL
Low target: 140 mg/dL
Minutes until next BG: 60 min
Minutes until next BG: 60 min
Minutes until next BG: 60 min
Minutes until next BG: 60 min
Minutes until next BG: 60 min
Minutes until next BG: 60 min
Minutes until next BG: 60 min
Minutes until next BG: 60 min
Minutes until next BG: 60 min
Minutes until next BG: 60 min
Minutes until next BG: 60 min
Minutes until next BG: 60 min
Minutes until next BG: 60 min
Minutes until next BG: 60 min
Minutes until next BG: 60 min
Minutes until next BG: 60 min
Minutes until next BG: 60 min
Multiplier: 0
Multiplier: 0
Multiplier: 0
Multiplier: 0
Multiplier: 0
Multiplier: 0.01
Multiplier: 0.01
Multiplier: 0.01
Multiplier: 0.02
Multiplier: 0.02
Multiplier: 0.02
Multiplier: 0.02
Multiplier: 0.02
Multiplier: 0.03
Multiplier: 0.03
Multiplier: 0.04
Multiplier: 0.04

## 2015-04-07 LAB — METABOLIC PANEL, BASIC
Anion gap: 11 mmol/L (ref 3.0–18)
BUN/Creatinine ratio: 19 (ref 12–20)
BUN: 22 MG/DL — ABNORMAL HIGH (ref 7.0–18)
CO2: 24 mmol/L (ref 21–32)
Calcium: 8.1 MG/DL — ABNORMAL LOW (ref 8.5–10.1)
Chloride: 112 mmol/L — ABNORMAL HIGH (ref 100–108)
Creatinine: 1.14 MG/DL (ref 0.6–1.3)
GFR est AA: >60 mL/min/{1.73_m2} (ref >60)
GFR est non-AA: >60 mL/min/{1.73_m2} (ref >60)
Glucose: 76 mg/dL (ref 74–99)
Potassium: 3.7 mmol/L (ref 3.5–5.5)
Sodium: 147 mmol/L — ABNORMAL HIGH (ref 136–145)

## 2015-04-07 LAB — BLOOD GAS, ARTERIAL POC
Base deficit (POC): 5 mmol/L
FIO2 (POC): 0.3 %
HCO3 (POC): 20.6 MMOL/L — ABNORMAL LOW (ref 22–26)
Inspiratory Time: 1 s
PEEP/CPAP (POC): 5 cmH2O
Patient temp.: 98.6
Set Rate: 14 {beats}/min
Tidal volume: 500 ml
Total resp. rate: 14
pCO2 (POC): 38.4 MMHG (ref 35.0–45.0)
pH (POC): 7.337 — ABNORMAL LOW (ref 7.35–7.45)
pO2 (POC): 105 MMHG — ABNORMAL HIGH (ref 80–100)
sO2 (POC): 98 % — ABNORMAL HIGH (ref 92–97)

## 2015-04-07 LAB — GLUCOSE, POC
Glucose (POC): 146 mg/dL — ABNORMAL HIGH (ref 70–110)
Glucose (POC): 185 mg/dL — ABNORMAL HIGH (ref 70–110)
Glucose (POC): 211 mg/dL — ABNORMAL HIGH (ref 70–110)
Glucose (POC): 230 mg/dL — ABNORMAL HIGH (ref 70–110)
Glucose (POC): 194 mg/dL — ABNORMAL HIGH (ref 70–110)
Glucose (POC): 165 mg/dL — ABNORMAL HIGH (ref 70–110)
Glucose (POC): 128 mg/dL — ABNORMAL HIGH (ref 70–110)
Glucose (POC): 119 mg/dL — ABNORMAL HIGH (ref 70–110)
Glucose (POC): 83 mg/dL (ref 70–110)
Glucose (POC): 101 mg/dL (ref 70–110)
Glucose (POC): 126 mg/dL — ABNORMAL HIGH (ref 70–110)
Glucose (POC): 161 mg/dL — ABNORMAL HIGH (ref 70–110)
Glucose (POC): 234 mg/dL — ABNORMAL HIGH (ref 70–110)
Glucose (POC): 169 mg/dL — ABNORMAL HIGH (ref 70–110)
Glucose (POC): 201 mg/dL — ABNORMAL HIGH (ref 70–110)
Glucose (POC): 181 mg/dL — ABNORMAL HIGH (ref 70–110)
Glucose (POC): 172 mg/dL — ABNORMAL HIGH (ref 70–110)
Glucose (POC): 167 mg/dL — ABNORMAL HIGH (ref 70–110)
Glucose (POC): 174 mg/dL — ABNORMAL HIGH (ref 70–110)

## 2015-04-07 LAB — EKG, 12 LEAD, SUBSEQUENT
Atrial Rate: 60 {beats}/min
Atrial Rate: 62 {beats}/min
Calculated P Axis: 75 degrees
Calculated P Axis: 77 degrees
Calculated R Axis: -11 degrees
Calculated R Axis: 0 degrees
Calculated T Axis: -85 degrees
Calculated T Axis: -91 degrees
P-R Interval: 228 ms
P-R Interval: 232 ms
Q-T Interval: 424 ms
Q-T Interval: 446 ms
QRS Duration: 82 ms
QRS Duration: 84 ms
QTC Calculation (Bezet): 430 ms
QTC Calculation (Bezet): 446 ms
Ventricular Rate: 60 {beats}/min
Ventricular Rate: 62 {beats}/min

## 2015-04-07 LAB — CBC W/O DIFF
HCT: 38.2 % (ref 36.0–48.0)
HGB: 12.3 g/dL — ABNORMAL LOW (ref 13.0–16.0)
MCH: 28.6 PG (ref 24.0–34.0)
MCHC: 32.2 g/dL (ref 31.0–37.0)
MCV: 88.8 FL (ref 74.0–97.0)
MPV: 10 FL (ref 9.2–11.8)
PLATELET: 195 10*3/uL (ref 135–420)
RBC: 4.3 M/uL — ABNORMAL LOW (ref 4.70–5.50)
RDW: 13.4 % (ref 11.6–14.5)
WBC: 13.1 10*3/uL (ref 4.6–13.2)

## 2015-04-07 LAB — PHOSPHORUS: Phosphorus: 4.3 MG/DL (ref 2.5–4.9)

## 2015-04-07 LAB — LACTIC ACID: Lactic acid: 1.6 MMOL/L (ref 0.4–2.0)

## 2015-04-07 LAB — MAGNESIUM: Magnesium: 2.2 mg/dL (ref 1.8–2.4)

## 2015-04-07 MED ORDER — PANTOPRAZOLE 40 MG IV SOLR
40 mg | Freq: Every day | INTRAVENOUS | Status: DC
Start: 2015-04-07 — End: 2015-04-10
  Administered 2015-04-08 – 2015-04-09 (×2): via INTRAVENOUS

## 2015-04-07 MED ORDER — INSULIN GLARGINE 100 UNIT/ML INJECTION
100 unit/mL | Freq: Every day | SUBCUTANEOUS | Status: DC
Start: 2015-04-07 — End: 2015-04-07

## 2015-04-07 MED ORDER — INSULIN GLARGINE 100 UNIT/ML INJECTION
100 unit/mL | Freq: Every day | SUBCUTANEOUS | Status: DC
Start: 2015-04-07 — End: 2015-04-08
  Administered 2015-04-07 – 2015-04-08 (×2): via SUBCUTANEOUS

## 2015-04-07 MED ORDER — PROPOFOL 10 MG/ML IV EMUL
10 mg/mL | INTRAVENOUS | Status: AC
Start: 2015-04-07 — End: 2015-04-08
  Administered 2015-04-07: 19:00:00 via INTRAVENOUS

## 2015-04-07 MED ORDER — METHYLPREDNISOLONE (PF) 40 MG/ML IJ SOLR
40 mg/mL | Freq: Three times a day (TID) | INTRAMUSCULAR | Status: DC
Start: 2015-04-07 — End: 2015-04-08
  Administered 2015-04-07 – 2015-04-08 (×3): via INTRAVENOUS

## 2015-04-07 MED ORDER — PROPOFOL INFUSION
10 mg/mL | INTRAVENOUS | Status: DC
Start: 2015-04-07 — End: 2015-04-08
  Administered 2015-04-07 – 2015-04-08 (×5): via INTRAVENOUS

## 2015-04-07 MED ORDER — DEXTROSE 5%-1/2 NORMAL SALINE IV
INTRAVENOUS | Status: DC
Start: 2015-04-07 — End: 2015-04-09
  Administered 2015-04-07 – 2015-04-09 (×6): via INTRAVENOUS

## 2015-04-07 MED ORDER — INSULIN LISPRO 100 UNIT/ML INJECTION
100 unit/mL | SUBCUTANEOUS | Status: DC
Start: 2015-04-07 — End: 2015-04-10
  Administered 2015-04-07 – 2015-04-09 (×10): via SUBCUTANEOUS

## 2015-04-07 MED FILL — HEPARIN (PORCINE) 5,000 UNIT/ML IJ SOLN: 5000 unit/mL | INTRAMUSCULAR | Qty: 1

## 2015-04-07 MED FILL — DEXTROSE 5%-1/2 NORMAL SALINE IV: INTRAVENOUS | Qty: 1000

## 2015-04-07 MED FILL — INSULIN GLARGINE 100 UNIT/ML INJECTION: 100 unit/mL | SUBCUTANEOUS | Qty: 1

## 2015-04-07 MED FILL — SOLU-MEDROL (PF) 40 MG/ML SOLUTION FOR INJECTION: 40 mg/mL | INTRAMUSCULAR | Qty: 1

## 2015-04-07 MED FILL — PROPOFOL 10 MG/ML IV EMUL: 10 mg/mL | INTRAVENOUS | Qty: 100

## 2015-04-07 MED FILL — HUMALOG U-100 INSULIN 100 UNIT/ML SUBCUTANEOUS SOLUTION: 100 unit/mL | SUBCUTANEOUS | Qty: 3

## 2015-04-07 NOTE — Other (Signed)
CRITICAL CARE INTERDISCIPLINARY ROUNDS    Patient Information:    Name:   Tracy [name]    Age:   59 y.o.    Admission Date:   04/06/2015    Critical Care Day: 2    Surgery Date:      Attending Provider:   Myles Rosenthal*    Surgeon:        Consultant(s):   Pak    Critical Care Physician:  pak    Code Status: Full Code    Problem List:     Patient Active Problem List   Diagnosis Code   ??? Drug overdose T50.901A   ??? Uncontrolled type II diabetes mellitus (HCC) E11.65   ??? Unresponsive R41.89   ??? Acute respiratory failure (HCC) J96.00   ??? CAD (coronary artery disease) I25.10   ??? Hypertension I10   ??? Hyperkalemia E87.5   ??? AKI (acute kidney injury) (HCC) N17.9   ??? Hyperglycemia due to type 2 diabetes mellitus (HCC) E11.65   ??? Encephalopathy G93.40   ??? Cocaine abuse F14.10       Principal Problem:  <principal problem not specified>    During rounds the following quality care indicators and evidence based practices were addressed :  DVT Prophylaxis Foley Day 2 ([facility] y) ; Central Line Day:2 Isolation:;     Ventilator Bundle:  Ventilator Bundle Order Set:  y Sedation Vacation y; Spontaneous Breathing Trial na; Restraintsy (Order, Necessity, Documentation)  Vent Day: 2    Sepsis Order Set:     Glycemic Control:   Recent Labs      04/07/15   0443  04/06/15   1704  04/06/15   0755   GLU  76  96  544*   ;  Recent Labs      04/07/15   0541  04/06/15   1307  04/06/15   0856   PHI  7.337*  7.337*  7.289*   PCO2I  38.4  37.7  45.5*   PO2I  105*  146*  277*    Adjustments     Acute MI/PCI:   Not applicable    Door to Balloon: Admission Time: 0749      Heart Failure:    Not applicable     SCIP Measures for other Surgeries:   Not applicable     Pneumonia:    Not applicable    Interdisciplinary team rounds were held with the following team membersCare Management, Diabetes Treatment Specialist, Infection Control, Nursing, Nutrition, Pastoral Care, Pharmacy, Physician, Respiratory  Therapy and Clinical Coordinator. Plan of care discussed.     Goals of Care/ Recommendations: 16 unit Lantus, Coverage q4H, remove from insulin gtt 2 hr after lantus given, D5 1/2 ns change to for increased sodium level. Most likely extubate tomoroow    See clinical pathway and/or care plan for interventions and desired outcomes.    Critical Care Discharge Status:  Red

## 2015-04-07 NOTE — Progress Notes (Addendum)
@  1940 Pt taken over drowsy in bed , all sedation off at present, vital signs fluctuates, flacc 0 on ventilator via et tube , OG tube clamped, Foley insitu draining cloudy amber urine, SCD's on bilateral feet . Assessment done and charted in appropriate flow sheets . Nursing management continues.  @1956  Pt kicking feet up, pulling at wrist restraints, pulling upper body off bed, RT at bedside, sedation turned own per orders in [facility] . Nursing care continues.  @2022  pt agitated in bed kicking feet attempting to lift self off bed sedation increased care continues.  @2100  relatives and visitors at bedside no new developments pt calm At this time observation continues.  @2130  pt calm flacc 0 rass -2 at present care continues.  @0000  no changes care continues.  @101  pt kicking in bed pulling at restraints , range of motion done pulses strong and palpable. Sedation increased observation continues.  @0130  pt sedated rass -2 nursing observation continues.  @0300  no changes care continues.  @0500  nursing care continues.  @0740  Bedside and Verbal shift change report given to Amil Amen (oncoming nurse) by Aubery Lapping (offgoing nurse). Report included the following information SBAR, Kardex, Intake/Output, MAR, Recent Results and Med Rec Status.   Alarm parameters reviewed, on and audible Appropriate for patient clinical condition

## 2015-04-07 NOTE — Progress Notes (Signed)
Chaplain conducted an initial consultation and Spiritual Assessment for [employer], who is a 59 y.o.,male. According to the patient???s chart Religious Affiliation is: No religion.     Patient is on a vent and is not responsive to me. No family present. Patient has a sister who is on her way from [facility] and will be the decision maker. Pt also has a significant other and a small child.     The reason the Patient came to the hospital is:   Patient Active Problem List    Diagnosis Date Noted   ??? Unresponsive 04/06/2015   ??? Acute respiratory failure (HCC) 04/06/2015   ??? Hyperkalemia 04/06/2015   ??? AKI (acute kidney injury) (HCC) 04/06/2015   ??? Hyperglycemia due to type 2 diabetes mellitus (HCC) 04/06/2015   ??? Encephalopathy 04/06/2015   ??? Cocaine abuse 04/06/2015   ??? CAD (coronary artery disease)    ??? Hypertension    ??? Uncontrolled type II diabetes mellitus (HCC) 10/19/2014   ??? Drug overdose 08/28/2013     The Chaplain provided the following Interventions:  Patient was reviewed in ICU Interdisciplinary Rounds.   Chart reviewed.    Plan:  Chaplains will continue to follow and will provide pastoral care as needed or requested.  Chaplain recommends bedside caregivers page the chaplain on duty if patient shows signs of acute spiritual or emotional distress.     [address], [place].   Board Certified Chaplain  [phone] - Office

## 2015-04-07 NOTE — Progress Notes (Signed)
[facility]  Pulmonary, Critical Care, and Sleep Medicine         Name: Franklin Baumbach MRN: [id number]   DOB: June 19, 1956 Hospital: [facility]   Date: 04/07/2015        Subjective:   6/16  Pt remain somnolent.  Non responsive.  Pupils remain pinpoint.  Off all sedation.  ON MV    This patient has been seen and evaluated at the request of Dr. Lucianne Muss for respiratory depression with drug overdose.  . Patient is a 59 y.o. male with multiple medical problems inculding dm, cad s/p stent x3, mi, htn,   Has past hx of overdose including amytryptlin.   Brought to ED unresponsive with respiratory rate of 4.  BS at presentation of 521.  SBP of 80s.         Pt was given narcan x2  And 2L of NS.   SBP improved.  Respiratory rate improved.   However pupils remain pinpoint.   Unresponsive noxious stimuli.  Started on narcan drip and insulin drip.      I was called by Dr Lucianne Muss about admission to ICU.  Debated about bipap. Per Dr. Lucianne Muss he was more awake, so agreed to try the bipap.  Seen patient at ER.  Mental status waxing and weaning,  Almost vomited infront of me.  bipap was stopped immeidately.  To protect the airway electively decided to intubate the patient.    Advised to start the ativan drip and stop the narcan drip.        Past Medical History   Diagnosis Date   ??? CAD (coronary artery disease)    ??? Diabetes (HCC)    ??? MI (myocardial infarction) (HCC)    ??? Hypertension    ??? High cholesterol    ??? Chronic pain    ??? Arthritis       Past Surgical History   Procedure Laterality Date   ??? Pr cardiac surg procedure unlist     ??? Hx coronary stent placement        Prior to Admission medications    Medication Sig Start Date End Date Taking? Authorizing Provider   pravastatin (PRAVACHOL) 80 mg tablet Take 1 Tab by mouth nightly. 10/29/14   Mohammed Corbin Ade, MD   amLODIPine (NORVASC) 10 mg tablet Take 1 Tab by mouth daily. 10/29/14   Mohammed Corbin Ade, MD    insulin glargine (LANTUS SOLOSTAR) 100 unit/mL (3 mL) pen 8 Units by SubCUTAneous route nightly. 10/29/14   Mohammed Corbin Ade, MD   oxyCODONE-acetaminophen (PERCOCET) 5-325 mg per tablet Take 1-2 Tabs by mouth every four (4) hours as needed for Pain. Max Daily Amount: 12 Tabs. 10/19/14   Frankey Poot, MD   pravastatin (PRAVACHOL) 80 mg tablet Take 80 mg by mouth nightly.    Historical Provider   docusate sodium (COLACE) 100 mg capsule Take 1 Cap by mouth two (2) times a day. 10/13/14   Junie Bame, PA   aspirin 81 mg chewable tablet Take 81 mg by mouth daily.    Phys Other, MD   clopidogrel (PLAVIX) 75 mg tablet Take 75 mg by mouth daily.    Phys Other, MD     No Known Allergies   History   Substance Use Topics   ??? Smoking status: Former Smoker   ??? Smokeless tobacco: Not on file   ??? Alcohol Use: Yes      Family History   Problem Relation Age of Onset   ???  Diabetes Mother    ??? Heart Disease Mother    ??? Diabetes Father         Current Facility-Administered Medications   Medication Dose Route Frequency   ??? insulin regular (NOVOLIN R, HUMULIN R) 100 Units in 0.9% sodium chloride 100 mL infusion  1-10 Units/hr IntraVENous TITRATE   ??? 0.9% sodium chloride infusion  150 mL/hr IntraVENous CONTINUOUS   ??? naloxone (NARCAN) 0.8 mg in 0.9% sodium chloride 500 mL infusion  0.4 mg/hr IntraVENous CONTINUOUS   ??? LORazepam (ATIVAN) 1 mg/mL in NS 60 ml infusion  0.1-5 mg/hr IntraVENous TITRATE   ??? chlorhexidine (PERIDEX) 0.12 % mouthwash 10 mL  10 mL Oral Q12H   ??? heparin (porcine) injection 5,000 Units  5,000 Units SubCUTAneous Q8H   ??? sodium chloride (NS) flush 5-10 mL  5-10 mL IntraVENous Q8H   ??? NOREPINephrine (LEVOPHED) 16,000 mcg in dextrose 5% 250 mL infusion  2-16 mcg/min IntraVENous TITRATE   ??? dextrose 5% and 0.9% NaCl infusion  125 mL/hr IntraVENous CONTINUOUS       Review of Systems:  Limited due to current status    Objective:   Vital Signs:     BP 110/69 mmHg   Pulse 65   Temp(Src) 97.9 ??F (36.6 ??C)   Resp 14   Wt 79.379 kg (175 lb)   SpO2 99%    O2 Device: Endotracheal tube   O2 Flow Rate (L/min): 15 l/min   Temp (24hrs), Avg:98.2 ??F (36.8 ??C), Min:97.6 ??F (36.4 ??C), Max:100.1 ??F (37.8 ??C)       Intake/Output:   Last shift:         Last 3 shifts: 06/14 1901 - 06/16 0700  In: 1824.8 [I.V.:1714.8]  Out: 1320 [Urine:1320]    Intake/Output Summary (Last 24 hours) at 04/07/15 0949  Last data filed at 04/07/15 0618   Gross per 24 hour   Intake 1824.83 ml   Output   1320 ml   Net 504.83 ml           Physical Exam:   General: comfortable intubated  HEENT;  Pupils are pinpoint but reactive  Neck: No adenopathy or thyroid swelling  CVS: S1S2 no murmurs  RS: Mod AE bilaterally, decreased BS with crackles at bases  Abd: soft, non tender, no hepatosplenomegaly  Neuro: non focal, awake, alert  Extrm: no leg edema   Skin: no rash  Lines/Tubes:  ETT       Data review:   Labs:  Recent Labs      04/07/15   0443  04/06/15   0755   WBC  13.1  15.1*   HGB  12.3*  13.9   HCT  38.2  42.2   PLT  195  212     Recent Labs      04/07/15   0443  04/06/15   1704  04/06/15   0755   NA  147*  146*  135*   K  3.7  5.2  6.6*   CL  112*  111*  99*   CO2  24  24  25    GLU  76  96  544*   BUN  22*  20*  16   CREA  1.14  1.41*  1.55*   CA  8.1*  8.0*  8.2*   MG  2.2   --   2.2   PHOS  4.3   --    --    ALB   --    --  3.5   SGOT   --    --   27   ALT   --    --   55   INR   --    --   1.2     No results for input(s): PH, PCO2, PO2, HCO3, FIO2 in the last 72 hours.    Imaging:  I have personally reviewed the patient???s radiographs and have reviewed the reports:     No Known Allergies   ??   Result Information    ?? Status Provider Status      ?? Final result (04/06/2015 ??8:55 AM) Open    ??   Study Result    ?? EXAM: CT head  ??  INDICATION: Headache and weakness  ??  COMPARISON: 08/28/2013  ??  TECHNIQUE: Axial CT imaging of the head was performed without intravenous  contrast.  ??   One or more dose reduction techniques were used on this CT: automated exposure  control, adjustment of the mAs and/or kVp according to patient's size, and  iterative reconstruction techniques. The specific techniques utilized on this CT  exam have been documented in the patient's electronic medical record.  ??  ??_______________  ??  FINDINGS:  ??  BRAIN AND POSTERIOR FOSSA: There is minimal periventricular matter  hypoattenuation present. The ventricles are stable in size and configuration.  Basilar cisterns are patent. Physiologic basal ganglia calcifications noted on  the right. There is no intracranial hemorrhage, mass effect, or midline shift.  There are no areas of abnormal parenchymal attenuation.  ??  EXTRA-AXIAL SPACES AND MENINGES: There are no abnormal extra-axial fluid  collections.  ??  CALVARIUM: Intact.  ??  SINUSES: Mucosal thickening of the anterior and posterior ethmoid air cells is  noted.  ??  OTHER: Atherosclerotic calcification of bilateral carotid siphons present.  ??  _______________  ??  IMPRESSION:  ??  ??  1. No acute intracranial abnormality. Of note, noncontrast head CT can be normal  in the context of early acute stroke.  2. Minimal periventricular white matter hypoattenuation, which are nonspecific,  likely pertains to sequela of chronic ischemic microvascular disease.  ??     No Known Allergies ??   Result Information    ?? Status Provider Status      ?? Final result (04/06/2015 ??9:46 AM) Open    ??   Study Result    ?? Chest, single view  ??  Indication: Shortness of breath, chest pain, patient found unresponsive.  ??  Comparison: Several prior exams, most recently 10/25/2014.  ??  Findings:?? Portable upright AP view of the chest was obtained. The patient is  rotated on the provided projection of the associated foreshortening of the left  hemithorax. Lungs are underexpanded with mild accentuation of contrast to the  markings. No focal pneumonic consolidation, pneumothorax, or large pleural   effusion. Cardiac size images and contours are stable. No acute osseous  normality. Calcific tendinosis of the left shoulder rotator cuff present,  unchanged.  ??  ??  ??  Impression:  Underexpanded lungs with associated bronchovascular crowding. No radiographic  evidence of acute pulmonary pulmonary abnormality.  ??            IMPRESSION:   ?? Acute respiratory failure on MV  ?? Hyperkalemia resolved  ?? Drug overdose with cocaine and (possibly tricyclic give prior hx )  ?? Acute kidney injury due to volume depletion, improved  ?? Hypotension due to above and voluem depletion improved  ??  Hyperosmolar Nonketotic hyperglycemia improved with glucose stabilizer  ?? Hx of CAD  ?? Poorly controlled DM with HgA1c of 12.5      RECOMMENDATIONS:   ?? Keep sedation with RASS -2    ?? See vent orders  ?? Continue fluid   ?? emperic abx ok  ?? Transition to sc insulin  ?? Foley for strict Is and Os  ?? DVT, PUD prophylaxis   ??   High complexity decision making was performed in this consultation and evaluation of this patient who is at high risk for decompensation with multiple organ involvement  Total critical care time spent rendering care exclusive of procedures: 40 minutes     Dahlia Client Shelbia Scinto, MD

## 2015-04-07 NOTE — Progress Notes (Signed)
Readmission Risk Assessment:     High Risk and MSSP/Good Help ACO patients    RRAT Score:  21 or greater (24)    Initial Assessment:  Chart reviewed, noted 59 yr old male was sent to ED after being found unresponsive at home by his fiancee; noted pt admitted with acute respiratory failure, unresponsive / encephalopathy due to drug overdose; noted pt UDS + cocaine; noted pt currently intubated on the mechanical vent.  CMgr will follow for discharge planning needs.   Emergency Contact: Emilia Beck, friend     Pertinent Medical Hx:  See H&P    PCP/Specialists:  [employer]:      DME:         High Risk Care Transition Plan:  1. Evaluate for [facility] or H2H, SNF, acute rehab, community care coordination of Resources.   2. Involve patient/caregiver in assessment, planning, education and implement of intervention.  3. CM daily patient care huddles/interdisciplinary rounds.  4. PCP/Specialist appointment within 48 hours of discharge unless otherwise specified.  5. Facilitate transportation and logistics for follow-up appointments.  6. Medication reconciliation ??? Pharmacy  7. Discharge follow-up phone call within 2 ??? 4 days (NN, [facility], Nursing) CM follow up as assigned.  8. Formal handoff between hospital provider and post-acute provider to transition patient  9. Handoff to [employer] Nurse Navigator or PCP practice.

## 2015-04-07 NOTE — Progress Notes (Signed)
NUTRITION SCREENING     BEST PRACTICE ALERT:     PO Intake:  No data found.    Anthropometrics:  Current Height: 5\' 11"  Current Weight: 79 kg (175 lbs)  BMI: 24.4 kg/m^2 (Normal)  SBW: 78 kg (172 lbs)(102%)      Estimated Nutrition Needs:  Kcals/day (Adult): 2165 Kcals/day based on [employer] 2003b  Protein (g): 90 g (1.1 gm/kg)  Fluid (ml): 1975 ml (25-30 ml/kg = 5929-2446 )    Based on:     Actual Wt           IBW      RD ASSESSMENT/PLAN:   Diet:  NPO  Food Allergies: NKFA     Pt screened during ICU rounds. CCD#1. 59 yo male admitted with drug overdose, uncontrolled DM, ARF, CAD, HTN, hypokalemia, hyperglycemia, AKI, encephalopathy, cocaine abuse. Pt is intubated and on an insulin drip.     Pt is currently NPO. If anticipate pt to be intubated for more than 3 days consider starting EN support with Glucerna 1.2 with a goal rate of 75 cc QH.     Pt is currently 102% of his SBW, BMI 24.4 kg/m^2, with Braden score of 12 which places him at high risk for skin breakdown. Continuing to follow daily during ICU rounds, assessing nutrition support against estimated needs.      Allegra Lai, RD  Pager: [phone]

## 2015-04-07 NOTE — Other (Deleted)
CRITICAL CARE INTERDISCIPLINARY ROUNDS    Patient Information:    Name:   Tracy [name]    Age:   59 y.o.    Admission Date:   04/06/2015    Critical Care Day: 2    Surgery Date:      Attending Provider:   Myles Rosenthal*  Surgeon:       Consultant(s):  Pak    Critical Care Physician:  Barbee Cough    Code Status: Full Code    Problem List:     Patient Active Problem List   Diagnosis Code   ??? Drug overdose T50.901A   ??? Uncontrolled type II diabetes mellitus (HCC) E11.65   ??? Unresponsive R41.89   ??? Acute respiratory failure (HCC) J96.00   ??? CAD (coronary artery disease) I25.10   ??? Hypertension I10   ??? Hyperkalemia E87.5   ??? AKI (acute kidney injury) (HCC) N17.9   ??? Hyperglycemia due to type 2 diabetes mellitus (HCC) E11.65   ??? Encephalopathy G93.40   ??? Cocaine abuse F14.10       Principal Problem:  <principal problem not specified>    During rounds the following quality care indicators and evidence based practices were addressed :  DVT Prophylaxis, PUD Prophylaxis and Pain Management Foley Day one ([facility]) ; Central Line Day:   Antibiotic Stewardship:; Probiotics Necessary:no    Ventilator Bundle:  Extubated today    Sepsis Order Set:  or Elements of Sepsis Bundle Reviewed (Fluids, Antibiotics, Lactic Acid, Cultures, Vasopressors, Steriods, Glycemic control, Peak Plateau)    Glycemic Control:   Recent Labs      04/07/15   0443  04/06/15   1704  04/06/15   0755   GLU  76  96  544*   ;  Recent Labs      04/07/15   0541  04/06/15   1307  04/06/15   0856   PHI  7.337*  7.337*  7.289*   PCO2I  38.4  37.7  45.5*   PO2I  105*  146*  277*    Adjustments     Acute MI/PCI:   Not applicable    Door to Balloon: Admission Time: 0749      Heart Failure:    Not applicable         Pneumonia:        Interdisciplinary team rounds were held with the following team membersDiabetes Treatment Specialist, Nursing, Pastoral Care, Pharmacy, Physician, Respiratory Therapy and Clinical Coordinator. Plan of care discussed.      Goals of Care/ Recommendations: discharge today  See clinical pathway and/or care plan for interventions and desired outcomes.    Critical Care Discharge Status:  Chilton Si

## 2015-04-07 NOTE — Progress Notes (Signed)
Hospitalist Progress Note    Patient: Tracy [name] MRN: [id number]  CSN: [id number]    Date of Birth: Apr 24, 1956  Age: 59 y.o.  Sex: male    DOA: 04/06/2015 LOS:  LOS: 1 day                Assessment/Plan     Patient Active Problem List   Diagnosis Code   ??? Drug overdose T50.901A   ??? Uncontrolled type II diabetes mellitus (HCC) E11.65   ??? Unresponsive R41.89   ??? Acute respiratory failure (HCC) J96.00   ??? CAD (coronary artery disease) I25.10   ??? Hypertension I10   ??? Hyperkalemia E87.5   ??? AKI (acute kidney injury) (HCC) N17.9   ??? Hyperglycemia due to type 2 diabetes mellitus (HCC) E11.65   ??? Encephalopathy G93.40   ??? Cocaine abuse F14.10           Chief complain : unresponsive, possible overdose  Patient intubated, off sedation, unresponsive     CRITICAL CARE PLAN    ?? Resp - intubated, to protect airway, vent management per pulmonary  ?? ID - continue empiric antibiotic, follow blood culture.  ?? CVS - Monitor HD. Wean pressors, levo,  for MAP>65. RIJ in place. Lactic acid in normal range  ?? Heme/onc - Follow H&H, plts.   ?? Renal - Trend BUN, Cr, follow I/O, Tracy in place. Replace Mg, K, Check phos. hyponatremia  ?? Endocrine - on insulin drip.   ?? Neuro/ Pain/ Sedation - off Sedation.  ?? GI - NPO for now.   ?? Prophylaxis - DVT: SCD for now, coagulopathy, GI: protonix  ?? Cocaine positive, possible other drug overdose. Past history of amitriptyline overdose.    50 minutes of critical care time spent in the direct evaluation and treatment of this high risk patient. The reason for providing this level of medical care for this critically ill patient was due a critical illness that impaired one or more vital organ systems such that there was a high probability of imminent or life threatening deterioration in the patients condition. This care involved high complexity decision making to assess, manipulate, and support vital system functions, to treat this degreee  vital organ system failure and to prevent further life threatening deterioration of the patient???s condition.         Physical Exam:  General: As above  HEENT: NC, Atraumatic.  PERRLA, anicteric sclerae.  Lungs: CTA Bilaterally. No Wheezing/Rhonchi/Rales.  Heart:  Regular  rhythm,?? No murmur, No Rubs, No Gallops  Abdomen: Soft, Non distended, Non tender. ??+Bowel sounds,   Extremities: No c/c/e             Vital signs/Intake and Output:  Visit Vitals   Item Reading   ??? BP 89/59 mmHg   ??? Pulse 64   ??? Temp 97.9 ??F (36.6 ??C)   ??? Resp 16   ??? Ht 5\' 11"  (1.803 m)   ??? Wt 79.379 kg (175 lb)   ??? BMI 24.42 kg/m2   ??? SpO2 99%     Current Shift:  06/16 0701 - 06/16 1900  In: -   Out: 110 [Urine:110]  Last three shifts:  06/14 1901 - 06/16 0700  In: 1824.8 [I.V.:1714.8]  Out: 1320 [Urine:1320]            Labs: Results:       Chemistry Recent Labs      04/07/15   0443  04/06/15   1704  04/06/15   0755  GLU  76  96  544*   NA  147*  146*  135*   K  3.7  5.2  6.6*   CL  112*  111*  99*   CO2  BUN  22*  20*  16   CREA  1.14  1.41*  1.55* CA  8.1*  8.0*  8.2*   AGAP  BUCR  19  14  10*   AP   --    --   28*   TP   --    --   6.8   ALB   --    --   3.5   GLOB   --    --   3.3   AGRAT   --    --   1.1      CBC w/Diff Recent Labs      04/07/15   0443  04/06/15   0755   WBC  13.1  15.1*   RBC  4.30*  4.77   HGB  12.3*  13.9   HCT  38.2  42.2   PLT  195  212   GRANS   --   79*   LYMPH   --   15*   EOS   --   0      Cardiac Enzymes Recent Labs      04/06/15   1245  04/06/15   0755   CPK  158  203   CKND1  1.1  0.9      Coagulation Recent Labs      04/06/15   0755   PTP  15.0   INR  1.2   APTT  23.8       Lipid Panel No results found for: CHOL, CHOLPOCT, CHOLX, CHLST, CHOLV, 884269, HDL, LDL, NLDLCT, DLDL, LDLC, DLDLP, 161096, VLDLC, VLDL, TGL, TGLX, TRIGL, [id number], TRIGP, TGLPOCT, [id number], CHHD, CHHDX   BNP No results for input(s): BNPP in the last 72 hours.   Liver Enzymes Recent Labs      04/06/15   0755    TP  6.8   ALB  3.5   AP  28*   SGOT  27      Thyroid Studies No results found for: T4, T3U, TSH, TSHEXT     Procedures/imaging: see electronic medical records for all procedures/Xrays and details which were not copied into this note but were reviewed prior to creation of Plan

## 2015-04-07 NOTE — ED Notes (Signed)
Chart review for critical value documentation verification.

## 2015-04-07 NOTE — Progress Notes (Addendum)
0715: Assumed care of pt from Rodman Key, RN. Alarm parameters reviewed, on and audible Appropriate for patient clinical condition     0730: Assessment complete. Ativan gtt stopped for sedation vacation. Pt sedated on vent. Pupils pinpointed and sluggish. Cough with suction, no command following. SR on monitor, levophed decreased to 2 mcg/min. Pulses palpable. Thick, yellow secretion with suction. Abdomen flat, hypoactive BS. OGT clamped. Foley draining hazy, amber urine. Restraints bilaterally. SCDs bilaterally. Insulin gtt on hold per stabilizer.    0800: sister, Lupita Leash, called for updates. Updates provided.    1010:Levophed stopped. Pt pupils sluggish and reactive, 47mm. Still will not open eyes or follow commands    1125: IDR complete. lantus 16 units to be given and stop insulin gtt per protocol. Glucose checks q4 hours. Propofol as needed if pt awakens. Plan to hopefully extubate tomorrow.    1200: Sister, Lupita Leash, provided updates via telephone, is on her way here from [place]: Levophed restarted.     1430: Pt agitated after repositioning. Fighting against restraints, attempting to grab ETT by sliding self down in bed. Pt will not open eyes or follow commands.  Propofol gtt started.      1600: Reassessment complete. Pt sedated, RASS -3, propofol stopped. Upper lungs coarse, diminished in bases. Suction provided.    1826Irene Pap and daughter at bedside. Updates provided. Levophed stopped.    Bedside and Verbal shift change report given to Allayne Gitelman, RN (oncoming nurse) by Rudi Heap, RN (offgoing nurse). Report included the following information SBAR, Kardex, Intake/Output, MAR, Recent Results and Cardiac Rhythm SR to SB. Alarm parameters reviewed and opportunities for questions provided.

## 2015-04-08 ENCOUNTER — Inpatient Hospital Stay: Admit: 2015-04-08 | Payer: MEDICARE | Primary: Family Medicine

## 2015-04-08 LAB — BLOOD GAS, ARTERIAL POC
Base deficit (POC): 5 mmol/L
FIO2 (POC): 0.3 %
HCO3 (POC): 20.7 MMOL/L — ABNORMAL LOW (ref 22–26)
PEEP/CPAP (POC): 5 cmH2O
Patient temp.: 98
Set Rate: 14 {beats}/min
Tidal volume: 500 ml
Total resp. rate: 17
pCO2 (POC): 38.9 MMHG (ref 35.0–45.0)
pH (POC): 7.333 — ABNORMAL LOW (ref 7.35–7.45)
pO2 (POC): 79 MMHG — ABNORMAL LOW (ref 80–100)
sO2 (POC): 95 % (ref 92–97)

## 2015-04-08 LAB — METABOLIC PANEL, BASIC
Anion gap: 12 mmol/L (ref 3.0–18)
BUN/Creatinine ratio: 23 — ABNORMAL HIGH (ref 12–20)
BUN: 20 MG/DL — ABNORMAL HIGH (ref 7.0–18)
CO2: 22 mmol/L (ref 21–32)
Calcium: 7.7 MG/DL — ABNORMAL LOW (ref 8.5–10.1)
Chloride: 109 mmol/L — ABNORMAL HIGH (ref 100–108)
Creatinine: 0.86 MG/DL (ref 0.6–1.3)
GFR est AA: >60 mL/min/{1.73_m2} (ref >60)
GFR est non-AA: >60 mL/min/{1.73_m2} (ref >60)
Glucose: 213 mg/dL — ABNORMAL HIGH (ref 74–99)
Potassium: 4.1 mmol/L (ref 3.5–5.5)
Sodium: 143 mmol/L (ref 136–145)

## 2015-04-08 LAB — CBC W/O DIFF
HCT: 36.3 % (ref 36.0–48.0)
HGB: 11.8 g/dL — ABNORMAL LOW (ref 13.0–16.0)
MCH: 29.1 PG (ref 24.0–34.0)
MCHC: 32.5 g/dL (ref 31.0–37.0)
MCV: 89.4 FL (ref 74.0–97.0)
MPV: 10.6 FL (ref 9.2–11.8)
PLATELET: 196 10*3/uL (ref 135–420)
RBC: 4.06 M/uL — ABNORMAL LOW (ref 4.70–5.50)
RDW: 13.8 % (ref 11.6–14.5)
WBC: 12.7 10*3/uL (ref 4.6–13.2)

## 2015-04-08 LAB — GLUCOSE, POC
Glucose (POC): 159 mg/dL — ABNORMAL HIGH (ref 70–110)
Glucose (POC): 179 mg/dL — ABNORMAL HIGH (ref 70–110)
Glucose (POC): 203 mg/dL — ABNORMAL HIGH (ref 70–110)
Glucose (POC): 204 mg/dL — ABNORMAL HIGH (ref 70–110)
Glucose (POC): 205 mg/dL — ABNORMAL HIGH (ref 70–110)
Glucose (POC): 215 mg/dL — ABNORMAL HIGH (ref 70–110)
Glucose (POC): 230 mg/dL — ABNORMAL HIGH (ref 70–110)

## 2015-04-08 MED ORDER — INSULIN GLARGINE 100 UNIT/ML INJECTION
100 unit/mL | Freq: Every day | SUBCUTANEOUS | Status: DC
Start: 2015-04-08 — End: 2015-04-12
  Administered 2015-04-08 – 2015-04-12 (×5): via SUBCUTANEOUS

## 2015-04-08 MED FILL — HEPARIN (PORCINE) 5,000 UNIT/ML IJ SOLN: 5000 unit/mL | INTRAMUSCULAR | Qty: 1

## 2015-04-08 MED FILL — PROPOFOL 10 MG/ML IV EMUL: 10 mg/mL | INTRAVENOUS | Qty: 100

## 2015-04-08 MED FILL — INSULIN GLARGINE 100 UNIT/ML INJECTION: 100 unit/mL | SUBCUTANEOUS | Qty: 1

## 2015-04-08 MED FILL — SOLU-MEDROL (PF) 40 MG/ML SOLUTION FOR INJECTION: 40 mg/mL | INTRAMUSCULAR | Qty: 1

## 2015-04-08 MED FILL — PROTONIX 40 MG INTRAVENOUS SOLUTION: 40 mg | INTRAVENOUS | Qty: 40

## 2015-04-08 MED FILL — CHLORHEXIDINE GLUCONATE 0.12 % MOUTHWASH: 0.12 % | Qty: 15

## 2015-04-08 NOTE — Progress Notes (Signed)
Placed on SBT.

## 2015-04-08 NOTE — Progress Notes (Signed)
Hospitalist Progress Note    Patient: Tracy [name] MRN: [id number]  CSN: [id number]    Date of Birth: 04-Sep-1956  Age: 59 y.o.  Sex: male    DOA: 04/06/2015 LOS:  LOS: 2 days          Chief Complaint: drug overdose          Assessment/Plan     Acute respiratory failure.  Drug overdose.  AKI  HHNK - dm uncontrolled.  Dehydration / hypotension.  CAD    Vent per PCCM.  Improving glycemic control.  Outpatient control terrible with a1c >12.  Hypotension improved.   AKI improving - resolved.Marland Kitchen  DVT and GI px.      Patient Active Problem List   Diagnosis Code   ??? Drug overdose T50.901A   ??? Uncontrolled type II diabetes mellitus (HCC) E11.65   ??? Unresponsive R41.89   ??? Acute respiratory failure (HCC) J96.00   ??? CAD (coronary artery disease) I25.10   ??? Hypertension I10   ??? Hyperkalemia E87.5   ??? AKI (acute kidney injury) (HCC) N17.9   ??? Hyperglycemia due to type 2 diabetes mellitus (HCC) E11.65   ??? Encephalopathy G93.40   ??? Cocaine abuse F14.10       Subjective:    Remains intubated at the time of my encounter this am.    Chart reviewed.  Admitted to icu with acute respiratory failure and drug overdose.      Review of systems:    Intubated.  Unable to obtain.       Vital signs/Intake and Output:  Visit Vitals   Item Reading   ??? BP 102/65 mmHg   ??? Pulse 59   ??? Temp 97.6 ??F (36.4 ??C)   ??? Resp 23   ??? Ht 5\' 11"  (1.803 m)   ??? Wt 79.379 kg (175 lb)   ??? BMI 24.42 kg/m2   ??? SpO2 100%     Current Shift:  06/17 0701 - 06/17 1900  In: -   Out: 200 [Urine:200]  Last three shifts:  06/15 1901 - 06/17 0700  In: 4527 [I.V.:4527]  Out: 1395 [Urine:1395]    Exam:    General: NAD, sedate, on vent.  Head/Neck: no stridor  VQQ:VZDGLOV rate and rhythm, no M/R/G, S1/S2 heard, no thrill  Lungs:Clear to auscultation bilaterally, no wheezes, rhonchi, or rales  Abdomen: Soft, Nontender, No distention, Normal Bowel sounds, No hepatomegaly  Extremities: No C/C/E, pulses palpable 2+                  Labs: Results:       Chemistry Recent Labs       04/08/15   0435  04/07/15   0443  04/06/15   1704  04/06/15   0755   GLU  213*  76  96  544*   NA  143  147*  146*  135*   K  4.1  3.7  5.2  6.6*   CL  109*  112*  111*  99*   CO2  22  24  24  25    BUN  20*  22*  20*  16   CREA  0.86  1.14  1.41*  1.55*   CA  7.7*  8.1*  8.0*  8.2* AGAP  12  11  11  11    BUCR  23*  19  14  10*   AP   --    --    --   28*  TP   --    --    --   6.8   ALB   --    --    --   3.5   GLOB   --    --    --   3.3   AGRAT   --    --    --   1.1      CBC w/Diff Recent Labs      04/08/15   0435  04/07/15   0443  04/06/15   0755   WBC  12.7  13.1  15.1*   RBC  4.06*  4.30*  4.77   HGB  11.8*  12.3*  13.9   HCT  36.3  38.2  42.2   PLT  196  195  212   GRANS   --    --   79*   LYMPH   --    --   15*   EOS   --    --   0      Cardiac Enzymes Recent Labs    04/06/15   1245  04/06/15   0755   CPK  158  203   CKND1  1.1  0.9      Coagulation Recent Labs      04/06/15   0755   PTP  15.0   INR  1.2   APTT  23.8       Lipid Panel No results found for: CHOL, CHOLPOCT, CHOLX, CHLST, CHOLV, 884269, HDL, LDL, NLDLCT, DLDL, LDLC, DLDLP, 098119, VLDLC, VLDL, TGL, TGLX, TRIGL, [id number], TRIGP, TGLPOCT, [id number], CHHD, CHHDX   BNP No results for input(s): BNPP in the last 72 hours.   Liver Enzymes Recent Labs      04/06/15   0755   TP  6.8   ALB  3.5   AP  28*   SGOT  27      Thyroid Studies No results found for: T4, T3U, TSH, TSHEXT     Procedures/imaging: see electronic medical records for all procedures/Xrays and details which were not copied into this note but were reviewed prior to creation of Plan      Delena Bali, MD

## 2015-04-08 NOTE — Progress Notes (Addendum)
1915 Bedside and Verbal shift change report received from S.Tominaga Physiological scientist). Report included the following information SBAR, Kardex, Intake/Output and MAR.Alarm parameters reviewed and opportunities for questions provided.    1945 Shift assessment completed.     0030 Reassessment completed. Patient complaint of mid chest pain. HR dropped in the 30's and then went back to 50's. Stat EKG was done, Cardiac marker panel, potassium and magnesium lab drawn and blood sugar was 167  Pacer pad was placed on patient.Hospialist notified of patient's condition. Dr. Barbee Cough notified, new orders carried out as prescribed, cardiology to be consulted.     0045 Patient passed swallow evaluation. Will continue to monitor.    9381 Nitroglycerin sublingual given.    0113 Patient verbalized "pain is almost gone".    0130 Dr. Megan Mans was consulted no new orders given.    0175 Dr. Dierdre Searles was notified of patient's potassium lab, new order given for 4 runs of KCL    0142  Notified Dr. Megan Mans of lab results, no need to start heparin drip.    0145 HR in 60's patient denies pain at this time.     0344 Patient HR in 30's - 40's, patient asymptomatic and denies pain. Dr. Megan Mans was notified new orders given to repeat EKG and give NS 500 ml bolus.     1025 Dr. Megan Mans was notified of EKG transmission. No new orders given    0514 Notified Dr Dierdre Searles about patient's SBP in 80's and asymptomatic , New orders given for NS 500 ml bolus    0615 Patient sbp 90's and asymptomatic. Patient resting in bed comfortable.

## 2015-04-08 NOTE — Wound Image (Signed)
Patient seen as a part of ICU rounds.  Patients skin is intact at this time.  Patient is moving in bed frequently and is restrained at this time. Wound care will continue to monitor during admission.

## 2015-04-08 NOTE — Other (Signed)
NUTRITIONAL ASSESSMENT AND PLAN OF CARE     Tracy [name]           59 y.o.             Patient Active Problem List   Diagnosis Code   ??? Drug overdose T50.901A   ??? Uncontrolled type II diabetes mellitus (HCC) E11.65   ??? Unresponsive R41.89   ??? Acute respiratory failure (HCC) J96.00   ??? CAD (coronary artery disease) I25.10   ??? Hypertension I10   ??? Hyperkalemia E87.5   ??? AKI (acute kidney injury) (HCC) N17.9   ??? Hyperglycemia due to type 2 diabetes mellitus (HCC) E11.65   ??? Encephalopathy G93.40   ??? Cocaine abuse F14.10        INTERVENTIONS/PLAN:   - basal orders in place, dose was increased this morning, recommend continue current regimen  - noted pt with known h/o DM2 and receiving steroids currently, needs may decrease once steroids d/c's  - pt will most likely need a mealtime dose of Humalog once eating  - bedside ed not appropriate at this time r/t pt condition    ASSESSMENT:   Nutritional Status: BG's put of target range r/t uncontrolled DM2, known h/o non-compliance, pt admitted with drug overdose & unresponsive, BG was >500, was intubated & placed on vent and Gluccostabilizer, was transitioned to suq-q insulin yesterday, extubated ~ 11 am this morning        Lab Results   Component Value Date/Time    GLU 213* 04/08/2015 04:35 AM    GLUCPOC 179* 04/08/2015 11:49 AM    GLUCPOC 203* 04/08/2015 09:13 AM    GLUCPOC 205* 04/08/2015 05:06 AM             Within target range (non-ICU: <140; ICU<180):  Yes     No    Current Insulin regimen:   - Lantus 20 units every day  - Humalog Normal Insulin Sensitivity Corrective Coverage    Home medication/insulin regimen: (unable to verify at this time r/t pt condition)  - Lantus 8 units qhs     HbA1c: equivalent  to ave BGlucose of 312 mg/dl for 2-3 months prior to admission    Lab Results   Component Value Date/Time    HEMOGLOBIN A1C 12.5 04/06/2015 07:55 AM       Adequate glycemic control PTA:   Yes   No       SUBJECTIVE/OBJECTIVE:    Information obtained from: IDT, chart, (pt unresponsive upon admission). Per visit with pt during last admission (10/2014)    "- Pt stated that he misses Lantus dose on average ~4x/week and misses Novalog mealtime doses ~4 days/week              * shared Glucose management graph with pt and discussed impact of taking insulin daily on BG              * pt made goal of taking Lantus everyday @ 6am in the mornings upon discharge"    - Encouraged OP DM Self Management Education Class   - Encouraged Consistent CHO diet"   - pt was also requiring much less insulin s/p wound debridement and his insulin dose were reduced from his home regimen to the following      - Lantus 30 units qhs    - Humalog 8 units qac    - Humalog Very Insulin Resistant Corrective Coverage        Diet:   Active Orders   Diet  DIET NPO       Medications:                 Reviewed     Most Recent POC Glucose:   Recent Labs      04/08/15   0435  04/07/15   0443  04/06/15   1704  04/06/15   0755   GLU  213*  76  96  544*         Labs:   Lab Results   Component Value Date/Time    SODIUM 143 04/08/2015 04:35 AM    POTASSIUM 4.1 04/08/2015 04:35 AM    CHLORIDE 109 04/08/2015 04:35 AM    CO2 22 04/08/2015 04:35 AM    ANION GAP 12 04/08/2015 04:35 AM    GLUCOSE 213 04/08/2015 04:35 AM    BUN 20 04/08/2015 04:35 AM    CREATININE 0.86 04/08/2015 04:35 AM    CALCIUM 7.7 04/08/2015 04:35 AM    MAGNESIUM 2.2 04/07/2015 04:43 AM    PHOSPHORUS 4.3 04/07/2015 04:43 AM    ALBUMIN 3.5 04/06/2015 07:55 AM       Anthropometrics: IBW : 78.019 kg (172 lb), % IBW (Calculated): 101.74 %, BMI (calculated): 24.5  Wt Readings from Last 1 Encounters:   04/08/15 79.379 kg (175 lb)      Ht Readings from Last 1 Encounters:   04/08/15 5\' 11"  (1.803 m)       Estimated Nutrition Needs: 2165 Kcals/day, Protein (g): 90 g (1.1 gm/kg) Fluid (ml): 1975 ml (25-30 ml/kg = 1975-2370 )  Based on:             Actual BW              IBW               Adjusted BW        Nutrition Diagnoses:    1.   Nutrition Diagnosis 1: altered nutrition-related lab values Related to: uncontrolled DM Nutrition Diagnosis 1 Evidenced By: pt with elevated BG levels, HBA1C ^12.5, estimated average glucose 312 mg/dl.     2. Nutrition Diagnosis 2: Inadequate oral food/beverage intake Nutrition Diagnosis 2 Related to: intubation Nutrition Diagnosis 2 Evidenced By: pt is NPO.       Nutrition Interventions:   Intervention :Food/Nutrient Delivery: N/A  Intervention: Nutrition Education: N/A  Intervention: Nutrition Counseling: N/A  Intervention: Coordination of Nutrition Care: N/A  Goal: Pt will receive adequate nutrition to meet estimated needs. Pt will maintain intact skin and avoid wt loss during admission.     Goal: Glucose will be within target range.  Intervention :Food/Nutrient Delivery: N/A  Intervention: Nutrition Education: N/A  Intervention: Nutrition Counseling: N/A  Intervention: Coordination of Nutrition Care: N/A  Goal: Pt will receive adequate nutrition to meet estimated needs. Pt will maintain intact skin and avoid wt loss during admission.      Nutrition Monitoring and Evaluation           Monitor po intake on meal rounds       Continue inpatient monitoring and intervention       Other:      Nutrition Risk:     High       Moderate      Minimal/Uncompromised    M. Remonia Richter, RD

## 2015-04-08 NOTE — Other (Addendum)
@  0800: assessment done, on sedation with vent.  @0930 : family: mother, sister, 2 ne ices at bedside, advised to ask question after explained pt on vent, sedated.  1 earring removed by sister: Lupita Leash will take home to prevent lost.  @1100 : off sedation as VO from Dr. Barbee Cough, RT: Darl Pikes at bedside, pt is not following verbal command,moving head side to side.  Will SBT till more awake.  @1145 : Dr. Barbee Cough VO to extubate now, pt is not follow verbal command: open eyes but moving both hands spontaneous.extubated by RT: Darl Pikes D, oral suction done N/C 2 L on, O2 sat: 98%, RR: 18.  @1230 : family stated they heard him saying " cold" offered blanket, pt still not responding at verbal command.  @1630 : stated" where am ?" after explained situation, back to sleep.  @1900 : Bedside and Verbal shift change report given to French Ana RN/Hasina J. RN (oncoming nurse) TominagaRN (offgoing nurse). Report included the following information Procedure Summary, Intake/Output, MAR, Recent Results and Cardiac Rhythm SR.

## 2015-04-08 NOTE — Progress Notes (Signed)
[facility]  Pulmonary, Critical Care, and Sleep Medicine         Name: Jennings Corado MRN: [id number]   DOB: December 29, 1955 Hospital: [facility]   Date: 04/08/2015        Subjective:   6/17  Pt remain sedated.  Tolerating vent. Per RT.  No issues      This patient has been seen and evaluated at the request of Dr. Lucianne Muss for respiratory depression with drug overdose.  . Patient is a 59 y.o. male with multiple medical problems inculding dm, cad s/p stent x3, mi, htn,   Has past hx of overdose including amytryptlin.   Brought to ED unresponsive with respiratory rate of 4.  BS at presentation of 521.  SBP of 80s.         Pt was given narcan x2  And 2L of NS.   SBP improved.  Respiratory rate improved.   However pupils remain pinpoint.   Unresponsive noxious stimuli.  Started on narcan drip and insulin drip.      I was called by Dr Lucianne Muss about admission to ICU.  Debated about bipap. Per Dr. Lucianne Muss he was more awake, so agreed to try the bipap.  Seen patient at ER.  Mental status waxing and weaning,  Almost vomited infront of me.  bipap was stopped immeidately.  To protect the airway electively decided to intubate the patient.    Advised to start the ativan drip and stop the narcan drip.        Past Medical History   Diagnosis Date   ??? CAD (coronary artery disease)    ??? Diabetes (HCC)    ??? MI (myocardial infarction) (HCC)    ??? Hypertension    ??? High cholesterol    ??? Chronic pain    ??? Arthritis       Past Surgical History   Procedure Laterality Date   ??? Pr cardiac surg procedure unlist     ??? Hx coronary stent placement        Prior to Admission medications    Medication Sig Start Date End Date Taking? Authorizing Provider   pravastatin (PRAVACHOL) 80 mg tablet Take 1 Tab by mouth nightly. 10/29/14   Mohammed Corbin Ade, MD   amLODIPine (NORVASC) 10 mg tablet Take 1 Tab by mouth daily. 10/29/14   Mohammed Corbin Ade, MD   insulin glargine (LANTUS SOLOSTAR) 100 unit/mL (3 mL) pen 8 Units by  SubCUTAneous route nightly. 10/29/14   Mohammed Corbin Ade, MD   oxyCODONE-acetaminophen (PERCOCET) 5-325 mg per tablet Take 1-2 Tabs by mouth every four (4) hours as needed for Pain. Max Daily Amount: 12 Tabs. 10/19/14   Frankey Poot, MD   pravastatin (PRAVACHOL) 80 mg tablet Take 80 mg by mouth nightly.    Historical Provider   docusate sodium (COLACE) 100 mg capsule Take 1 Cap by mouth two (2) times a day. 10/13/14   Junie Bame, PA   aspirin 81 mg chewable tablet Take 81 mg by mouth daily.    Phys Other, MD   clopidogrel (PLAVIX) 75 mg tablet Take 75 mg by mouth daily.    Phys Other, MD     No Known Allergies   History   Substance Use Topics   ??? Smoking status: Former Smoker   ??? Smokeless tobacco: Not on file   ??? Alcohol Use: Yes      Family History   Problem Relation Age of Onset   ??? Diabetes  Mother    ??? Heart Disease Mother    ??? Diabetes Father         Current Facility-Administered Medications   Medication Dose Route Frequency   ??? [START ON 04/09/2015] insulin glargine (LANTUS) injection 20 Units  20 Units SubCUTAneous DAILY   ??? dextrose 5 % - 0.45% NaCl infusion  125 mL/hr IntraVENous CONTINUOUS   ??? pantoprazole (PROTONIX) 40 mg in sodium chloride 0.9 % 10 mL injection  40 mg IntraVENous DAILY   ??? methylPREDNISolone (PF) (SOLU-MEDROL) injection 40 mg  40 mg IntraVENous Q8H   ??? insulin lispro (HUMALOG) injection   SubCUTAneous Q4H   ??? propofol (DIPRIVAN) infusion  5-50 mcg/kg/min IntraVENous TITRATE   ??? insulin regular (NOVOLIN R, HUMULIN R) 100 Units in 0.9% sodium chloride 100 mL infusion  1-10 Units/hr IntraVENous TITRATE   ??? naloxone (NARCAN) 0.8 mg in 0.9% sodium chloride 500 mL infusion  0.4 mg/hr IntraVENous CONTINUOUS   ??? LORazepam (ATIVAN) 1 mg/mL in NS 60 ml infusion  0.1-5 mg/hr IntraVENous TITRATE   ??? chlorhexidine (PERIDEX) 0.12 % mouthwash 10 mL  10 mL Oral Q12H   ??? heparin (porcine) injection 5,000 Units  5,000 Units SubCUTAneous Q8H    ??? sodium chloride (NS) flush 5-10 mL  5-10 mL IntraVENous Q8H   ??? NOREPINephrine (LEVOPHED) 16,000 mcg in dextrose 5% 250 mL infusion  2-16 mcg/min IntraVENous TITRATE       Review of Systems:  Limited due to current status    Objective:   Vital Signs:    BP 102/65 mmHg   Pulse 51   Temp(Src) 97.2 ??F (36.2 ??C)   Resp 14   Ht 5\' 11"  (1.803 m)   Wt 79.379 kg (175 lb)   BMI 24.42 kg/m2   SpO2 100%    O2 Device: Endotracheal tube   O2 Flow Rate (L/min): 15 l/min   Temp (24hrs), Avg:98 ??F (36.7 ??C), Min:97.2 ??F (36.2 ??C), Max:98.6 ??F (37 ??C)       Intake/Output:   Last shift:         Last 3 shifts: 06/15 1901 - 06/17 0700  In: 4527 [I.V.:4527]  Out: 1395 [Urine:1395]    Intake/Output Summary (Last 24 hours) at 04/08/15 1030  Last data filed at 04/08/15 0500   Gross per 24 hour   Intake 2928.79 ml   Output    815 ml   Net 2113.79 ml           Physical Exam:   General: comfortable intubated  HEENT;  Pupils are pinpoint but reactive  Neck: No adenopathy or thyroid swelling  CVS: S1S2 no murmurs  RS: Mod AE bilaterally, decreased BS with crackles at bases  Abd: soft, non tender, no hepatosplenomegaly  Neuro: non focal, awake, alert  Extrm: no leg edema   Skin: no rash  Lines/Tubes:  ETT       Data review:   Labs:  Recent Labs      04/08/15   0435  04/07/15   0443  04/06/15   0755   WBC  12.7  13.1  15.1*   HGB  11.8*  12.3*  13.9   HCT  36.3  38.2  42.2   PLT  196  195  212     Recent Labs      04/08/15   0435  04/07/15   0443  04/06/15   1704  04/06/15   0755   NA  143  147*  146*  135*  K  4.1  3.7  5.2  6.6*   CL  109*  112*  111*  99*   CO2  22  24  24  25    GLU  213*  76  96  544*   BUN  20*  22*  20*  16   CREA  0.86  1.14  1.41*  1.55*   CA  7.7*  8.1*  8.0*  8.2*   MG   --   2.2   --   2.2   PHOS   --   4.3   --    --    ALB   --    --    --   3.5   SGOT   --    --    --   27   ALT   --    --    --   55   INR   --    --    --   1.2     No results for input(s): PH, PCO2, PO2, HCO3, FIO2 in the last 72 hours.     Imaging:  I have personally reviewed the patient???s radiographs and have reviewed the reports:     No Known Allergies   ??   Result Information    ?? Status Provider Status      ?? Final result (04/06/2015 ??8:55 AM) Open    ??   Study Result    ?? EXAM: CT head  ??  INDICATION: Headache and weakness  ??  COMPARISON: 08/28/2013  ??  TECHNIQUE: Axial CT imaging of the head was performed without intravenous  contrast.  ??  One or more dose reduction techniques were used on this CT: automated exposure  control, adjustment of the mAs and/or kVp according to patient's size, and  iterative reconstruction techniques. The specific techniques utilized on this CT  exam have been documented in the patient's electronic medical record.  ??  ??_______________  ??  FINDINGS:  ??  BRAIN AND POSTERIOR FOSSA: There is minimal periventricular matter  hypoattenuation present. The ventricles are stable in size and configuration.  Basilar cisterns are patent. Physiologic basal ganglia calcifications noted on  the right. There is no intracranial hemorrhage, mass effect, or midline shift.  There are no areas of abnormal parenchymal attenuation.  ??  EXTRA-AXIAL SPACES AND MENINGES: There are no abnormal extra-axial fluid  collections.  ??  CALVARIUM: Intact.  ??  SINUSES: Mucosal thickening of the anterior and posterior ethmoid air cells is  noted.  ??  OTHER: Atherosclerotic calcification of bilateral carotid siphons present.  ??  _______________  ??  IMPRESSION:  ??  ??  1. No acute intracranial abnormality. Of note, noncontrast head CT can be normal  in the context of early acute stroke.  2. Minimal periventricular white matter hypoattenuation, which are nonspecific,  likely pertains to sequela of chronic ischemic microvascular disease.  ??     No Known Allergies   ??   Result Information    ?? Status Provider Status      ?? Final result (04/06/2015 ??9:46 AM) Open    ??   Study Result    ?? Chest, single view  ??   Indication: Shortness of breath, chest pain, patient found unresponsive.  ??  Comparison: Several prior exams, most recently 10/25/2014.  ??  Findings:?? Portable upright AP view of the chest was obtained. The patient is  rotated on the provided projection of  the associated foreshortening of the left  hemithorax. Lungs are underexpanded with mild accentuation of contrast to the  markings. No focal pneumonic consolidation, pneumothorax, or large pleural  effusion. Cardiac size images and contours are stable. No acute osseous  normality. Calcific tendinosis of the left shoulder rotator cuff present,  unchanged.  ??  ??  ??  Impression:  Underexpanded lungs with associated bronchovascular crowding. No radiographic  evidence of acute pulmonary pulmonary abnormality.  ??            IMPRESSION:   ?? Acute respiratory failure on MV  ?? Hyperkalemia resolved  ?? Drug overdose with cocaine and (possibly tricyclic give prior hx )  ?? Acute kidney injury due to volume depletion, improved  ?? Hypotension due to above and voluem depletion improved  ?? Hyperosmolar Nonketotic hyperglycemia improved with glucose stabilizer  ?? Hx of CAD  ?? Poorly controlled DM with HgA1c of 12.5      RECOMMENDATIONS:   ?? Dc sedation   ?? Will attempt to extubate  ?? Continue fluid   ?? emperic abx ok  ?? Transition to sc insulin  ?? Foley for strict Is and Os  ?? DVT, PUD prophylaxis   ??   High complexity decision making was performed in this consultation and evaluation of this patient who is at high risk for decompensation with multiple organ involvement  Total critical care time spent rendering care exclusive of procedures: 40 minutes     Dahlia Client Mareta Chesnut, MD

## 2015-04-08 NOTE — Progress Notes (Signed)
Extubated pt and placed on 4L NC,02 sats 100%.

## 2015-04-09 ENCOUNTER — Inpatient Hospital Stay: Payer: MEDICARE | Primary: Family Medicine

## 2015-04-09 LAB — CBC W/O DIFF
HCT: 37.1 % (ref 36.0–48.0)
HGB: 12 g/dL — ABNORMAL LOW (ref 13.0–16.0)
MCH: 29 PG (ref 24.0–34.0)
MCHC: 32.3 g/dL (ref 31.0–37.0)
MCV: 89.6 FL (ref 74.0–97.0)
MPV: 11.1 FL (ref 9.2–11.8)
PLATELET: 195 10*3/uL (ref 135–420)
RBC: 4.14 M/uL — ABNORMAL LOW (ref 4.70–5.50)
RDW: 13.7 % (ref 11.6–14.5)
WBC: 18.7 10*3/uL — ABNORMAL HIGH (ref 4.6–13.2)

## 2015-04-09 LAB — PROTHROMBIN TIME + INR
INR: 1.2 (ref 0.8–1.2)
Prothrombin time: 14.6 s (ref 11.5–15.2)

## 2015-04-09 LAB — METABOLIC PANEL, BASIC
Anion gap: 7 mmol/L (ref 3.0–18)
BUN/Creatinine ratio: 19 (ref 12–20)
BUN: 15 MG/DL (ref 7.0–18)
CO2: 26 mmol/L (ref 21–32)
Calcium: 7.6 MG/DL — ABNORMAL LOW (ref 8.5–10.1)
Chloride: 111 mmol/L — ABNORMAL HIGH (ref 100–108)
Creatinine: 0.8 MG/DL (ref 0.6–1.3)
GFR est AA: >60 mL/min/{1.73_m2} (ref >60)
GFR est non-AA: >60 mL/min/{1.73_m2} (ref >60)
Glucose: 117 mg/dL — ABNORMAL HIGH (ref 74–99)
Potassium: 3.2 mmol/L — ABNORMAL LOW (ref 3.5–5.5)
Sodium: 144 mmol/L (ref 136–145)

## 2015-04-09 LAB — GLUCOSE, POC
Glucose (POC): 157 mg/dL — ABNORMAL HIGH (ref 70–110)
Glucose (POC): 169 mg/dL — ABNORMAL HIGH (ref 70–110)
Glucose (POC): 96 mg/dL (ref 70–110)
Glucose (POC): 113 mg/dL — ABNORMAL HIGH (ref 70–110)
Glucose (POC): 109 mg/dL (ref 70–110)
Glucose (POC): 151 mg/dL — ABNORMAL HIGH (ref 70–110)

## 2015-04-09 LAB — CARDIAC PANEL,(CK, CKMB & TROPONIN)
CK - MB: 1.3 ng/ml (ref 0.5–3.6)
CK - MB: 0.9 ng/ml (ref 0.5–3.6)
CK - MB: <0.5 ng/ml — ABNORMAL LOW (ref 0.5–3.6)
CK-MB Index: 1.9 % (ref 0.0–4.0)
CK-MB Index: 1.4 % (ref 0.0–4.0)
CK: 69 U/L (ref 39–308)
CK: 63 U/L (ref 39–308)
CK: 61 U/L (ref 39–308)
Troponin-I, QT: 0.02 NG/ML (ref 0.00–0.06)
Troponin-I, QT: 0.02 NG/ML (ref 0.00–0.06)
Troponin-I, QT: 0.11 NG/ML — ABNORMAL HIGH (ref 0.00–0.06)

## 2015-04-09 LAB — PTT: aPTT: 43.6 s — ABNORMAL HIGH (ref 23.0–36.4)

## 2015-04-09 LAB — MAGNESIUM: Magnesium: 2.1 mg/dL (ref 1.8–2.4)

## 2015-04-09 LAB — POTASSIUM: Potassium: 3 mmol/L — ABNORMAL LOW (ref 3.5–5.5)

## 2015-04-09 MED ORDER — POTASSIUM CHLORIDE 10 MEQ/100 ML IV PIGGY BACK
10 mEq/0 mL | INTRAVENOUS | Status: AC
Start: 2015-04-09 — End: 2015-04-09
  Administered 2015-04-09 (×4): via INTRAVENOUS

## 2015-04-09 MED ORDER — SODIUM CHLORIDE 0.9% BOLUS IV
0.9 % | INTRAVENOUS | Status: AC
Start: 2015-04-09 — End: 2015-04-09
  Administered 2015-04-09: 09:00:00 via INTRAVENOUS

## 2015-04-09 MED ORDER — ASPIRIN 325 MG TAB
325 mg | ORAL | Status: AC
Start: 2015-04-09 — End: 2015-04-09
  Administered 2015-04-09: 05:00:00 via ORAL

## 2015-04-09 MED ORDER — NITROGLYCERIN 0.4 MG SUBLINGUAL TAB
0.4 mg | SUBLINGUAL | Status: DC | PRN
Start: 2015-04-09 — End: 2015-04-13
  Administered 2015-04-09: 05:00:00 via SUBLINGUAL

## 2015-04-09 MED ORDER — SODIUM CHLORIDE 0.9% BOLUS IV
0.9 % | Freq: Once | INTRAVENOUS | Status: AC
Start: 2015-04-09 — End: 2015-04-09
  Administered 2015-04-09: 08:00:00 via INTRAVENOUS

## 2015-04-09 MED ORDER — POTASSIUM CHLORIDE 10 MEQ/100 ML IV PIGGY BACK
10 mEq/0 mL | INTRAVENOUS | Status: AC
Start: 2015-04-09 — End: 2015-04-09
  Administered 2015-04-09: 06:00:00

## 2015-04-09 MED ORDER — NITROGLYCERIN 0.4 MG SUBLINGUAL TAB
0.4 mg | SUBLINGUAL | Status: AC
Start: 2015-04-09 — End: 2015-04-09
  Administered 2015-04-09: 05:00:00 via SUBLINGUAL

## 2015-04-09 MED ORDER — DOPAMINE 800 MG/500 ML D5W INFUSION
800 mg/500 mL (1,600 mcg/mL) | INTRAVENOUS | Status: DC
Start: 2015-04-09 — End: 2015-04-10
  Administered 2015-04-09: 14:00:00 via INTRAVENOUS

## 2015-04-09 MED ORDER — MORPHINE 2 MG/ML INJECTION
2 mg/mL | Freq: Once | INTRAMUSCULAR | Status: DC
Start: 2015-04-09 — End: 2015-04-09

## 2015-04-09 MED FILL — HEPARIN (PORCINE) 5,000 UNIT/ML IJ SOLN: 5000 unit/mL | INTRAMUSCULAR | Qty: 1

## 2015-04-09 MED FILL — POTASSIUM CHLORIDE 10 MEQ/100 ML IV PIGGY BACK: 10 mEq/0 mL | INTRAVENOUS | Qty: 100

## 2015-04-09 MED FILL — PROTONIX 40 MG INTRAVENOUS SOLUTION: 40 mg | INTRAVENOUS | Qty: 40

## 2015-04-09 MED FILL — INSULIN GLARGINE 100 UNIT/ML INJECTION: 100 unit/mL | SUBCUTANEOUS | Qty: 1

## 2015-04-09 MED FILL — ASPIRIN 325 MG TAB: 325 mg | ORAL | Qty: 1

## 2015-04-09 MED FILL — SODIUM CHLORIDE 0.9 % IV: INTRAVENOUS | Qty: 500

## 2015-04-09 MED FILL — NITROSTAT 0.4 MG SUBLINGUAL TABLET: 0.4 mg | SUBLINGUAL | Qty: 1

## 2015-04-09 MED FILL — DOPAMINE 800 MG/500 ML D5W INFUSION: 800 mg/500 mL (1,600 mcg/mL) | INTRAVENOUS | Qty: 500

## 2015-04-09 MED FILL — POTASSIUM CHLORIDE 10 MEQ/100 ML IV PIGGY BACK: 10 mEq/0 mL | INTRAVENOUS | Qty: 300

## 2015-04-09 NOTE — Consults (Signed)
[facility]                              [address]                          [facility] [place] [address]                                   CONSULTATION    PATIENT:   Tracy [name], Tracy [name]  MRN:           [id number]     DATE:     04/09/2015  BILLING:       [id number]    ROOM :    [id number]  INTERPRETI Marisue Ivan, MD  NG:  REFERRING: Germain Osgood, M.D.      ROOM NUMBER: ICU.        CONCLUSIONS  1. Overdose unknown substance.  2. Bradycardia caused by nonconducted premature atrial contractions.  3. Known coronary artery disease.  4. Diabetes.  5. Prior myocardial infarction.  6. Coronary stents in place.  7. Hypertension.  8. High cholesterol.  9. Chronic pain.  10. Arthritis.    RECOMMENDATIONS  1. We will get an echocardiogram to assess left ventricular function at  this time.  2. I have given him some normal saline during the night for relatively low  blood pressure.  3. We will follow and make further recommendations as appropriate.    DISCUSSION: The patient's records were reviewed. The patient was  interviewed and examined. The patient is a 59 year old man who was admitted  I think on the 15th because of decreased responsiveness. In the ER, he was  felt to have respiratory depression with drug overdose. He has a history of  multiple medical problems including coronary artery disease, prior stents,  myocardial infarction, hypertension, diabetes. He has a past history of  overdosing on amitriptyline. He was brought to the ED unresponsive with  respiratory rate of 4, systolic blood pressure in the 80s, and a blood  sugar of 521. In the ER, he was given Narcan and normal saline and his  blood pressure improved, respiratory rate improved. His pupils remain  pinpoint, however. He remained unresponsive. He was started on a Narcan  drip and insulin drip. The patient apparently was somewhat more awake he   apparently was gagging and almost vomiting. For this reason, he was  intubated to protect his airway because of his decreased level of  consciousness and unresponsiveness. The patient has since been admitted to  the ICU. He has been extubated. I was called during the night because he  complained of chest and his heart rate was low as well as his blood  pressure. He was given normal saline, which improved his blood pressure.  Indeed, his bradycardia was caused by premature atrial contractions, which  were not conducted.    His drug screen was positive for cocaine, but negative for opiates. At this  juncture, it is uncertain what he overdosed on.    PAST MEDICAL HISTORY: Usual childhood diseases without sequelae. There is  no history of rheumatic fever, scarlet fever or whooping cough. Past  history includes diagnoses of coronary artery disease, diabetes, myocardial  infarction, hypertension, high cholesterol, chronic pain, arthritis.    PAST SURGICAL HISTORY: Includes coronary stent placements.  MEDICATIONS PRIOR TO ADMISSION: Were apparently  1. Pravachol.  2. Norvasc.  3. Lantus.  4. Oxycodone.  5. Colace.  6. Plavix.    DRUG ALLERGIES: NONE KNOWN.    SOCIAL HISTORY: The patient is a former smoker. He does use alcohol. He  also has had some drug abuse issues.    FAMILY HISTORY: Positive for diabetes and heart disease.    SYSTEMS REVIEW  Limited due to the patient's mental status. He is not responsive and  answering questions. He is awake at this time, but does not really answer  questions so systems review is postponed.    PHYSICAL EXAMINATION  GENERAL: Reveals a well-developed, well-nourished man in no apparent  distress.  VITAL SIGNS: Blood pressure was like 93/57. Pulse is in the 40s.  HEENT: Pupils are small. Mouth, nose and throat appear normal.  NECK: Supple. Thyroid is not enlarged. There is no JVD. Carotid pulses are  present bilaterally. I do not hear any bruits. Trachea is in the midline.   CHEST: Lungs are clear to auscultation bilaterally. There are no rales,  rhonchi or wheezes.  CARDIAC: Precordium is normal to palpation. There are no heaves, thrills,  or ectopic impulses. First and second heart sounds are normal. I do not  hear any murmurs, gallops, or rubs.  ABDOMEN: Soft; liver and spleen are not enlarged. No abdominal masses or  bruits are appreciated. Bowel sounds are present. Abdominal aorta is not  palpable.  EXTREMITIES: No cyanosis or clubbing. Peripheral pulses are present.  NEUROLOGIC: The patient is awake, but poorly responsive. There is no  obvious cranial nerve deficit. Pupils are small. Motor and sensation are  grossly intact. Gait and station are not tested.    IMPRESSION: At this juncture, the patient appears stable. His blood  pressure is low, but certainly he is not in shock. His renal function is  normal and his skin is warm and dry. The nature of the overdose is unknown  at this time. Since the drug screen is negative for opiates, it would  appear not to be opiates, but there are many other causes of miosis.    I have ordered an echocardiogram to assess LV function. His LV function was  normal on a nuclear stress test in 05/2014. His markers are negative and  there is no evidence of acute coronary syndrome. I do not think his chest  pain was ischemic.    Thank you for asking me to see this patient. I shall follow him as deemed  appropriate.                     Marisue Ivan, MD      JPJ:wmx  D: 04/09/2015 12:34 P   T: 04/09/2015 01:19 P  BY:  [id number]  #:  [id number]  CScriptDoc #:  [id number]    cc:   Germain Osgood, M.D.        Marisue Ivan, MD        Maryann Conners, MD

## 2015-04-09 NOTE — Other (Signed)
Bedside and Verbal shift change report received from S.Tominaga,RN (offgoing nurse). Report included the following information SBAR, Kardex, Intake/Output and MAR.Alarm parameters reviewed and opportunities for questions provided.

## 2015-04-09 NOTE — Progress Notes (Signed)
Chaplain was asked by nurse to visit with patient. He has several dynamics going on and she felt a visit might comfort him.  Chaplain attempted to visit but patient was asleep. Chaplain left a spiritual care card with business card included.    Chaplain Glori Bickers  [phone]

## 2015-04-09 NOTE — Progress Notes (Signed)
[facility]  Pulmonary, Critical Care, and Sleep Medicine         Name: Dequandre Cordova MRN: [id number]   DOB: 1955/11/02 Hospital: [facility]   Date: 04/09/2015        Subjective:   6/18  Pt with chest pain last night. Gave asa, sl NTG and morphine.  EKG with braycardia.  Cardiac enzy negative.  Spoke with cardiology, agree with echo.   Believe bradycardia due to nonconducting PACs  Pt asymtpomatic this am.         This patient has been seen and evaluated at the request of Dr. Lucianne Muss for respiratory depression with drug overdose.  . Patient is a 59 y.o. male with multiple medical problems inculding dm, cad s/p stent x3, mi, htn,   Has past hx of overdose including amytryptlin.   Brought to ED unresponsive with respiratory rate of 4.  BS at presentation of 521.  SBP of 80s.         Pt was given narcan x2  And 2L of NS.   SBP improved.  Respiratory rate improved.   However pupils remain pinpoint.   Unresponsive noxious stimuli.  Started on narcan drip and insulin drip.      I was called by Dr Lucianne Muss about admission to ICU.  Debated about bipap. Per Dr. Lucianne Muss he was more awake, so agreed to try the bipap.  Seen patient at ER.  Mental status waxing and weaning,  Almost vomited infront of me.  bipap was stopped immeidately.  To protect the airway electively decided to intubate the patient.    Advised to start the ativan drip and stop the narcan drip.        Past Medical History   Diagnosis Date   ??? CAD (coronary artery disease)    ??? Diabetes (HCC)    ??? MI (myocardial infarction) (HCC)    ??? Hypertension    ??? High cholesterol    ??? Chronic pain    ??? Arthritis       Past Surgical History   Procedure Laterality Date   ??? Pr cardiac surg procedure unlist     ??? Hx coronary stent placement        Prior to Admission medications    Medication Sig Start Date End Date Taking? Authorizing Provider   pravastatin (PRAVACHOL) 80 mg tablet Take 1 Tab by mouth nightly. 10/29/14   Mohammed Corbin Ade, MD    amLODIPine (NORVASC) 10 mg tablet Take 1 Tab by mouth daily. 10/29/14   Mohammed Corbin Ade, MD   insulin glargine (LANTUS SOLOSTAR) 100 unit/mL (3 mL) pen 8 Units by SubCUTAneous route nightly. 10/29/14   Mohammed Corbin Ade, MD   oxyCODONE-acetaminophen (PERCOCET) 5-325 mg per tablet Take 1-2 Tabs by mouth every four (4) hours as needed for Pain. Max Daily Amount: 12 Tabs. 10/19/14   Frankey Poot, MD   pravastatin (PRAVACHOL) 80 mg tablet Take 80 mg by mouth nightly.    Historical Provider   docusate sodium (COLACE) 100 mg capsule Take 1 Cap by mouth two (2) times a day. 10/13/14   Junie Bame, PA   aspirin 81 mg chewable tablet Take 81 mg by mouth daily.    Phys Other, MD   clopidogrel (PLAVIX) 75 mg tablet Take 75 mg by mouth daily.    Phys Other, MD     No Known Allergies   History   Substance Use Topics   ??? Smoking status: Former Smoker   ???  Smokeless tobacco: Not on file   ??? Alcohol Use: Yes      Family History   Problem Relation Age of Onset   ??? Diabetes Mother    ??? Heart Disease Mother    ??? Diabetes Father         Current Facility-Administered Medications   Medication Dose Route Frequency   ??? potassium chloride 10 mEq/100 mL IVPB premix pgbk       ??? DOPamine in 5 % dextrose (INTROPIN) 800 mg/500 mL (1,600 mcg/mL) infusion  0-10 mcg/kg/min IntraVENous TITRATE   ??? insulin glargine (LANTUS) injection 20 Units  20 Units SubCUTAneous DAILY   ??? dextrose 5 % - 0.45% NaCl infusion  125 mL/hr IntraVENous CONTINUOUS   ??? pantoprazole (PROTONIX) 40 mg in sodium chloride 0.9 % 10 mL injection  40 mg IntraVENous DAILY   ??? insulin lispro (HUMALOG) injection   SubCUTAneous Q4H   ??? naloxone (NARCAN) 0.8 mg in 0.9% sodium chloride 500 mL infusion  0.4 mg/hr IntraVENous CONTINUOUS   ??? chlorhexidine (PERIDEX) 0.12 % mouthwash 10 mL  10 mL Oral Q12H   ??? heparin (porcine) injection 5,000 Units  5,000 Units SubCUTAneous Q8H   ??? sodium chloride (NS) flush 5-10 mL  5-10 mL IntraVENous Q8H       Review of Systems:   Limited due to current status    Objective:   Vital Signs:    BP 93/57 mmHg   Pulse 40   Temp(Src) 97.7 ??F (36.5 ??C)   Resp 14   Ht 5\' 11"  (1.803 m)   Wt 79.379 kg (175 lb)   BMI 24.42 kg/m2   SpO2 100%    O2 Device: Nasal cannula   O2 Flow Rate (L/min): 3 l/min   Temp (24hrs), Avg:97.7 ??F (36.5 ??C), Min:97.5 ??F (36.4 ??C), Max:97.8 ??F (36.6 ??C)       Intake/Output:   Last shift:         Last 3 shifts: 06/16 1901 - 06/18 0700  In: 5943.3 [I.V.:5943.3]  Out: 1150 [Urine:1150]    Intake/Output Summary (Last 24 hours) at 04/09/15 1019  Last data filed at 04/09/15 0656   Gross per 24 hour   Intake 4587.5 ml   Output    700 ml   Net 3887.5 ml           Physical Exam:   General: comfortable   HEENT;  Pupils are reactive  Neck: No adenopathy or thyroid swelling  CVS: S1S2 no murmurs  RS: Mod AE bilaterally, decreased BS with crackles at bases  Abd: soft, non tender, no hepatosplenomegaly  Neuro: non focal, awake, alert  Extrm: no leg edema   Skin: no rash          Data review:   Labs:  Recent Labs      04/09/15   0058  04/08/15   0435  04/07/15   0443   WBC  18.7*  12.7  13.1   HGB  12.0*  11.8*  12.3*   HCT  37.1  36.3  38.2   PLT  195  196  195     Recent Labs      04/09/15   0058  04/08/15   0435  04/07/15   0443   NA  144  143  147*   K  3.2*   3.0*  4.1  3.7   CL  111*  109*  112*   CO2  26  22  24    GLU  117*  213*  76   BUN  15  20*  22*   CREA  0.80  0.86  1.14   CA  7.6*  7.7*  8.1*   MG  2.1   --   2.2   PHOS   --    --   4.3   INR  1.2   --    --      No results for input(s): PH, PCO2, PO2, HCO3, FIO2 in the last 72 hours.    Imaging:  I have personally reviewed the patient???s radiographs and have reviewed the reports:     No Known Allergies   ??   Result Information    ?? Status Provider Status      ?? Final result (04/06/2015 ??8:55 AM) Open    ??   Study Result    ?? EXAM: CT head  ??  INDICATION: Headache and weakness  ??  COMPARISON: 08/28/2013  ??   TECHNIQUE: Axial CT imaging of the head was performed without intravenous  contrast.  ??  One or more dose reduction techniques were used on this CT: automated exposure  control, adjustment of the mAs and/or kVp according to patient's size, and  iterative reconstruction techniques. The specific techniques utilized on this CT  exam have been documented in the patient's electronic medical record.  ??  ??_______________  ??  FINDINGS:  ??  BRAIN AND POSTERIOR FOSSA: There is minimal periventricular matter  hypoattenuation present. The ventricles are stable in size and configuration.  Basilar cisterns are patent. Physiologic basal ganglia calcifications noted on  the right. There is no intracranial hemorrhage, mass effect, or midline shift.  There are no areas of abnormal parenchymal attenuation.  ??  EXTRA-AXIAL SPACES AND MENINGES: There are no abnormal extra-axial fluid  collections.  ??  CALVARIUM: Intact.  ??  SINUSES: Mucosal thickening of the anterior and posterior ethmoid air cells is  noted.  ??  OTHER: Atherosclerotic calcification of bilateral carotid siphons present.  ??  _______________  ??  IMPRESSION:  ??  ??  1. No acute intracranial abnormality. Of note, noncontrast head CT can be normal  in the context of early acute stroke.  2. Minimal periventricular white matter hypoattenuation, which are nonspecific,  likely pertains to sequela of chronic ischemic microvascular disease.  ??     No Known Allergies   ??   Result Information    ?? Status Provider Status      ?? Final result (04/06/2015 ??9:46 AM) Open    ??   Study Result    ?? Chest, single view  ??  Indication: Shortness of breath, chest pain, patient found unresponsive.  ??  Comparison: Several prior exams, most recently 10/25/2014.  ??  Findings:?? Portable upright AP view of the chest was obtained. The patient is  rotated on the provided projection of the associated foreshortening of the left  hemithorax. Lungs are underexpanded with mild accentuation of contrast to the   markings. No focal pneumonic consolidation, pneumothorax, or large pleural  effusion. Cardiac size images and contours are stable. No acute osseous  normality. Calcific tendinosis of the left shoulder rotator cuff present,  unchanged.  ??  ??  ??  Impression:  Underexpanded lungs with associated bronchovascular crowding. No radiographic  evidence of acute pulmonary pulmonary abnormality.  ??            IMPRESSION:   ?? Acute respiratory failure on MV resolved, tolerated the extubation yesterday  ??  Hyperkalemia resolved  ?? Drug overdose with cocaine and (possibly tricyclic give prior hx )  ?? Acute kidney injury due to volume depletion, improved  ?? Hypotension due to above and voluem depletion improved  ?? Hyperosmolar Nonketotic hyperglycemia improved with glucose stabilizer, off stabilizer,  resolved  ?? Hx of CAD  ?? Poorly controlled DM with HgA1c of 12.5  ?? Bradycardia,   ?? Intentional overdose of TCA and cocaine      RECOMMENDATIONS:   ?? Continue fluid   ?? emperic abx ok  ??  sc insulin  ?? Echo per cardiology  ?? DVT, PUD prophylaxis  ?? Psych eval will be helpful   ??   High complexity decision making was performed in this consultation and evaluation of this patient who is at high risk for decompensation with multiple organ involvement  Total critical care time spent rendering care exclusive of procedures:  30 minutes     Dahlia Client Timberly Yott, MD

## 2015-04-09 NOTE — Progress Notes (Signed)
Echocardiogram completed. Report to follow.

## 2015-04-09 NOTE — Other (Signed)
EMR entered and reviewed by Cardiac Cath Lab for the purpose of chart review in the course of performing audit tools and responsibilities related to performance improvement.    Cardiac Cath Lab:  Pre Procedure Chart Check    Patients chart was accessed and reviewed for possible and/or scheduled procedure.      Creatinine Clearance:  CREATININE: 0.86 (04/08/15 0435)  Estimated creatinine clearance - 97.3 mL/min    Total Contrast  Load:  3 x estimated clearance amount=       ml    75% of Contrast Load:  0.75 x Total Contrast Load=        ml    Recent Labs      04/09/15   0700  04/09/15   0058  04/08/15   0435   WBC   --   18.7*  12.7   RBC   --   4.14*  4.06*   HCT   --   37.1  36.3   HGB   --   12.0*  11.8*   PLT   --   195  196   INR   --   1.2   --    APTT   --   43.6*   --    PTP   --   14.6   --    NA   --    --   143   K   --   3.0*  4.1   BUN   --    --   20*   CREA   --    --   0.86   GFRAA   --    --   >60   GFRNA   --    --   >60   CA   --    --   7.7*   CPK  63  69   --    CKMB  0.9  1.3   --    CKND1  1.4  1.9   --    TROIQ  0.02  0.02   --        BMI: Body mass index is 24.42 kg/(m^2).    ALLERGIES: No Known Allergies    Lines:        Peripheral IV 04/06/15 Right Antecubital (Active)   Site Assessment Clean, dry, & intact 04/09/2015  4:15 AM   Phlebitis Assessment 0 04/09/2015  4:15 AM   Infiltration Assessment 0 04/09/2015  4:15 AM   Dressing Status Clean, dry, & intact 04/09/2015  4:15 AM   Dressing Type Transparent 04/09/2015  4:15 AM   Hub Color/Line Status Capped 04/09/2015  4:15 AM   Action Taken Open ports on tubing capped 04/08/2015  4:20 AM   Alcohol Cap Used Yes 04/09/2015  4:15 AM       Peripheral IV 04/06/15 Left Antecubital (Active)   Site Assessment Clean, dry, & intact 04/09/2015  4:15 AM   Phlebitis Assessment 0 04/09/2015  4:15 AM   Infiltration Assessment 0 04/09/2015  4:15 AM   Dressing Status Clean, dry, & intact 04/09/2015  4:15 AM   Dressing Type Transparent 04/09/2015  4:15 AM    Hub Color/Line Status Capped 04/09/2015  4:15 AM   Action Taken Open ports on tubing capped 04/08/2015  4:20 AM   Alcohol Cap Used Yes 04/09/2015  4:15 AM       Peripheral IV 04/06/15 Left;Lower Arm (Active)   Site Assessment Clean, dry, &  intact 04/09/2015  4:15 AM   Phlebitis Assessment 0 04/09/2015  4:15 AM   Infiltration Assessment 0 04/09/2015  4:15 AM   Dressing Status Clean, dry, & intact 04/09/2015  4:15 AM   Dressing Type Transparent 04/09/2015  4:15 AM   Hub Color/Line Status Capped 04/09/2015  4:15 AM   Action Taken Open ports on tubing capped 04/08/2015  4:20 AM   Alcohol Cap Used Yes 04/09/2015  4:15 AM       Peripheral IV 04/06/15 Right Forearm (Active)   Site Assessment Clean, dry, & intact 04/09/2015  4:15 AM   Phlebitis Assessment 0 04/09/2015  4:15 AM   Infiltration Assessment 0 04/09/2015  4:15 AM   Dressing Status Clean, dry, & intact 04/09/2015  4:15 AM   Dressing Type Transparent 04/09/2015  4:15 AM   Hub Color/Line Status Capped 04/09/2015  4:15 AM   Action Taken Open ports on tubing capped 04/08/2015  4:20 AM   Alcohol Cap Used Yes 04/09/2015  4:15 AM          History:    Past Medical History   Diagnosis Date   ??? CAD (coronary artery disease)    ??? Diabetes (HCC)    ??? MI (myocardial infarction) (HCC)    ??? Hypertension    ??? High cholesterol    ??? Chronic pain    ??? Arthritis      Past Surgical History   Procedure Laterality Date   ??? Pr cardiac surg procedure unlist     ??? Hx coronary stent placement       Patient Active Problem List   Diagnosis Code   ??? Drug overdose T50.901A   ??? Uncontrolled type II diabetes mellitus (HCC) E11.65   ??? Unresponsive R41.89   ??? Acute respiratory failure (HCC) J96.00   ??? CAD (coronary artery disease) I25.10   ??? Hypertension I10   ??? Hyperkalemia E87.5   ??? AKI (acute kidney injury) (HCC) N17.9   ??? Hyperglycemia due to type 2 diabetes mellitus (HCC) E11.65   ??? Encephalopathy G93.40   ??? Cocaine abuse F14.10

## 2015-04-09 NOTE — Other (Addendum)
@  0800: plan of day reviewed, mouth care encouraged by CNA, pt was not able to coordinated well to brush his teeth noted by CNA.  @0920 : Dr. Barbee Cough at bedside aware of pt was intentional over dose for suicide, also has  Hx. of that once with combination of drug. Safety partner at bedside.  @1000 : Dr.JP Jones made rounding reviewed EKG strips, stated, no procedure plan at this point with echo to be done, pt can eat after bedside swallowing passed, D/C foley, leave femoral CVL.  @1300 :Dr.parikh made rounding: Psy.eval. When medically stable VO from Dr. Ramiro Harvest noted.  @1700 : family: sisters, niece at bedside, pt teared when family hugged.  @1900 : Bedside and Verbal shift change report given to Karle Barr (oncoming nurse) by Dionisio David (offgoing nurse). Report included the following information Kardex, Intake/Output, MAR and Cardiac Rhythm SR,SB with pause.Marland Kitchen

## 2015-04-09 NOTE — Progress Notes (Addendum)
@   1900 pt taken over awake alert oriented x4 denies pain at present, vital signs stable. Relatives at bedside presently . Remains on 3L/nc spo2 99%. SCD's on bilateral feet. Assessment done and charted in relevant flow sheets. Nursing management continues.  @2100  no changes care continues.  @2300  pt awake denies pain vital signs stable. Pt in no obvious distress. Nursing observation continues.  @0100  no new developments nursing care continues.  @0300  pt in no obvious distress asleep easily aroused , vital signs WNL flacc0 at present. Nursing management continues.  @0500  no changes , pt awake alert oriented x continues to deny pain , vital signs WNL nursing care continues.  @0620  pt did not urinate after midnight bladder scan 148 mls results. Pt encouraged to drink fluids same given care continues.  @0746  Bedside and Verbal shift change report given to Doristine Locks (oncoming nurse) by Aubery Lapping (offgoing nurse). Report included the following information SBAR, Kardex, Intake/Output, MAR, Recent Results and Med Rec Status.   Alarm parameters reviewed, on and audible Appropriate for patient clinical condition

## 2015-04-09 NOTE — Progress Notes (Signed)
Hospitalist Progress Note    Patient: Tracy [name] MRN: [id number]  CSN: [id number]    Date of Birth: 12-19-1955  Age: 59 y.o.  Sex: male    DOA: 04/06/2015 LOS:  LOS: 3 days              IMPRESSION and Plan:    Darey Hershberger is a 59 y.o. male with   Patient Active Problem List    Diagnosis Date Noted   ??? Unresponsive 04/06/2015   ??? Acute respiratory failure (HCC) 04/06/2015   ??? Hyperkalemia 04/06/2015   ??? AKI (acute kidney injury) (HCC) 04/06/2015   ??? Hyperglycemia due to type 2 diabetes mellitus (HCC) 04/06/2015   ??? Encephalopathy 04/06/2015   ??? Cocaine abuse 04/06/2015   ??? CAD (coronary artery disease)    ??? Hypertension    ??? Uncontrolled type II diabetes mellitus (HCC) 10/19/2014   ??? Drug overdose 08/28/2013     Active Problems:    Drug overdose (08/28/2013)      Uncontrolled type II diabetes mellitus (HCC) (10/19/2014)      Unresponsive (04/06/2015)      Acute respiratory failure (HCC) (04/06/2015)      CAD (coronary artery disease) ()      Hypertension ()      Hyperkalemia (04/06/2015)      AKI (acute kidney injury) (HCC) (04/06/2015)      Hyperglycemia due to type 2 diabetes mellitus (HCC) (04/06/2015)      Encephalopathy (04/06/2015)      Cocaine abuse (04/06/2015)      Bradycardia  Plan    S/p extubation  OOB  Labs as ordered  appriciate pulmonary and cardiology input  Cont Lantus and SSI    Needs psych consult for sucidal ideation      Patient's condition is improving.        Recommend to continue hospitalization.  Discussed with patient.    Chief Complaints:   Chief Complaint   Patient presents with   ??? Unresponsive     SUBJECTIVE:  Pt is seen and examined. Chart reviwed. He is feeling better    No CP or SOB  No Fever, chills, Nausea, vomitting.           Review of systems:    General: No fevers or chills.  Cardiovascular: No chest pain or pressure. No palpitations.   Pulmonary: shortness of breath.   Gastrointestinal: No nausea, vomiting    PE:  Patient Vitals for the past 24 hrs:    BP Temp Pulse Resp SpO2 Height Weight   04/09/15 1050 - 97.5 ??F (36.4 ??C) - - - - -   04/09/15 0954 93/57 mmHg - (!) 40 - - - -   04/09/15 0800 - - - - 98 % - -   04/09/15 0710 - 97.7 ??F (36.5 ??C) - - - - -   04/09/15 0640 90/54 mmHg - (!) 51 14 100 % - -   04/09/15 0635 (!) 86/51 mmHg - (!) 54 14 100 % - -   04/09/15 0630 (!) 86/51 mmHg - (!) 51 13 100 % - -   04/09/15 0625 90/51 mmHg - (!) 54 16 100 % - -   04/09/15 0620 101/61 mmHg - (!) 51 15 100 % - -   04/09/15 0615 94/60 mmHg - (!) 55 14 100 % - -   04/09/15 0610 97/59 mmHg - (!) 55 14 100 % - -   04/09/15 0605 (!) 89/49 mmHg - (!) 52 15  100 % - -   04/09/15 0600 94/76 mmHg - (!) 54 15 100 % - -   04/09/15 0555 99/62 mmHg - (!) 56 15 100 % - -   04/09/15 0550 100/62 mmHg - (!) 55 15 100 % - -   04/09/15 0545 96/66 mmHg - (!) 54 15 99 % - -   04/09/15 0540 (!) 82/59 mmHg - (!) 51 17 99 % - -   04/09/15 0535 94/60 mmHg - (!) 57 19 100 % - -   04/09/15 0530 - - 67 18 100 % - -   04/09/15 0525 (!) 82/59 mmHg - (!) 54 15 99 % - -   04/09/15 0520 (!) 83/55 mmHg - (!) 54 15 98 % - -   04/09/15 0515 (!) 84/44 mmHg - (!) 54 20 98 % - -   04/09/15 0510 (!) 85/53 mmHg - (!) 56 20 98 % - -   04/09/15 0505 (!) 75/40 mmHg - (!) 55 23 100 % - -   04/09/15 0500 108/83 mmHg - 71 24 100 % - -   04/09/15 0455 92/59 mmHg - (!) 57 14 100 % - -   04/09/15 0450 91/57 mmHg - (!) 56 13 100 % - -   04/09/15 0445 (!) 88/58 mmHg - (!) 51 15 100 % - -   04/09/15 0440 93/57 mmHg - (!) 53 14 100 % - -   04/09/15 0439 - 97.5 ??F (36.4 ??C) - - - - -   04/09/15 0435 100/58 mmHg - (!) 49 14 100 % - -   04/09/15 0430 94/60 mmHg - (!) 52 14 100 % - -   04/09/15 0425 96/59 mmHg - (!) 54 16 100 % - -   04/09/15 0420 (!) 83/61 mmHg - (!) 35 16 100 % - -   04/09/15 0415 98/60 mmHg - (!) 52 15 100 % - -   04/09/15 0410 97/61 mmHg - (!) 53 18 99 % - -   04/09/15 0405 99/61 mmHg - (!) 46 14 100 % - -   04/09/15 0400 96/62 mmHg - (!) 58 16 100 % - -   04/09/15 0355 97/64 mmHg - (!) 46 15 100 % - -    04/09/15 0350 94/67 mmHg - (!) 49 12 100 % - -   04/09/15 0345 (!) 79/52 mmHg - (!) 37 16 100 % - -   04/09/15 0340 (!) 85/48 mmHg - (!) 54 14 100 % - -   04/09/15 0335 - - (!) 44 12 100 % - -   04/09/15 0333 92/49 mmHg - (!) 50 16 100 % - -   04/09/15 0330 (!) 89/48 mmHg - (!) 44 16 100 % - -   04/09/15 0325 - - (!) 57 16 100 % - -   04/09/15 0320 - - (!) 59 16 100 % - -   04/09/15 0316 112/55 mmHg - (!) 55 17 99 % - -   04/09/15 0310 - - (!) 52 15 100 % - -   04/09/15 0305 - - (!) 58 23 100 % - -   04/09/15 0300 98/62 mmHg - (!) 56 13 100 % - -   04/09/15 0255 97/63 mmHg - (!) 57 16 100 % - -   04/09/15 0250 105/64 mmHg - (!) 58 16 100 % - -   04/09/15 0245 104/63 mmHg - (!) 54 15 100 % - -   04/09/15 0240  101/63 mmHg - (!) 54 10 100 % - -   04/09/15 0230 100/63 mmHg - (!) 55 15 100 % - -   04/09/15 0225 99/63 mmHg - (!) 55 16 99 % - -   04/09/15 0220 103/61 mmHg - (!) 54 16 99 % - -   04/09/15 0215 101/62 mmHg - (!) 56 17 98 % - -   04/09/15 0210 100/63 mmHg - (!) 55 20 97 % - -   04/09/15 0205 106/62 mmHg - (!) 56 18 96 % - -   04/09/15 0200 108/62 mmHg - (!) 57 (!) 6 95 % - -   04/09/15 0155 102/67 mmHg - (!) 55 22 - - -   04/09/15 0150 108/66 mmHg - (!) 53 19 96 % - -   04/09/15 0145 96/54 mmHg - 67 24 93 % - -   04/09/15 0140 104/76 mmHg - 61 30 91 % - -   04/09/15 0135 103/63 mmHg - (!) 49 17 93 % - -   04/09/15 0130 94/55 mmHg - (!) 51 19 (!) 88 % - -   04/09/15 0125 93/60 mmHg - (!) 49 19 (!) 87 % - -   04/09/15 0120 (!) 87/62 mmHg - (!) 56 21 90 % - -   04/09/15 0115 96/62 mmHg - 60 21 94 % - -   04/09/15 0110 106/67 mmHg - (!) 46 (!) 3 96 % - -   04/09/15 0108 100/71 mmHg - (!) 51 - - - -   04/09/15 0106 106/61 mmHg - (!) 50 - - - -   04/09/15 0105 - - (!) 53 16 94 % - -   04/09/15 0100 100/71 mmHg - (!) 51 15 94 % - -   04/09/15 0000 125/72 mmHg - (!) 58 16 100 % - -   04/08/15 2357 - 97.8 ??F (36.6 ??C) - - - - -   04/08/15 2300 129/73 mmHg - (!) 56 15 100 % - -    04/08/15 2200 123/74 mmHg - (!) 54 18 99 % - -   04/08/15 2100 126/70 mmHg - (!) 52 14 100 % - -   04/08/15 2000 121/68 mmHg - (!) 58 13 98 % - -   04/08/15 1943 - 97.6 ??F (36.4 ??C) - - - - -   04/08/15 1900 127/73 mmHg - 61 20 - - -   04/08/15 1700 123/81 mmHg - - - - - -   04/08/15 1618 - 97.7 ??F (36.5 ??C) - - - - -   04/08/15 1321 - - - - -  (1.803 m) 79.379 kg (175 lb)       Intake/Output Summary (Last 24 hours) at 04/09/15 1237  Last data filed at 04/09/15 1050   Gross per 24 hour   Intake 4587.5 ml   Output    600 ml   Net 3987.5 ml     Patient Vitals for the past 120 hrs:   Weight   04/06/15 0755 79.379 kg (175 lb)   04/08/15 1321 79.379 kg (175 lb)           HEENT: Perrla, EOMI; oral mucosa well prefused; Conjunctiva not injected  Neck: No JVD, Negative carotid bruits, normal pulses; No thyromegaly  Resp: CTA bilaterally; No wheezes or rales  CV: RRR s1s2 No murmur; No rubs; PMI not displaced  Abd: Positive Bowel Sounds, Soft, Nontender  Ext: No clubbing; No cyanosis; No edema  Neuro: Alert and  oriented; Nonfocal  Skin: Warm, Dry, Intact  Pulses: 2+ DP/PT/Rad      Intake and Output:  Current Shift:  06/18 0701 - 06/18 1900  In: -   Out: 100 [Urine:100]  Last three shifts:  06/16 1901 - 06/18 0700  In: 5943.3 [I.V.:5943.3]  Out: 1150 [Urine:1150]    Lab/Data Reviewed:  Recent Results (from the past 8 hour(s))   GLUCOSE, POC    Collection Time: 04/09/15  4:51 AM   Result Value Ref Range    Glucose (POC) 96 70 - 110 mg/dL   CARDIAC PANEL,(CK, CKMB & TROPONIN)    Collection Time: 04/09/15  7:00 AM   Result Value Ref Range    CK 63 39 - 308 U/L    CK - MB 0.9 0.5 - 3.6 ng/ml    CK-MB Index 1.4 0.0 - 4.0 %    Troponin-I, Qt. 0.02 0.00 - 0.06 NG/ML   GLUCOSE, POC    Collection Time: 04/09/15  8:05 AM   Result Value Ref Range    Glucose (POC) 113 (H) 70 - 110 mg/dL   GLUCOSE, POC    Collection Time: 04/09/15 11:38 AM   Result Value Ref Range    Glucose (POC) 109 70 - 110 mg/dL     Medications:   Current Facility-Administered Medications   Medication Dose Route Frequency   ??? nitroglycerin (NITROSTAT) tablet 0.4 mg  0.4 mg SubLINGual Q5MIN PRN   ??? potassium chloride 10 mEq/100 mL IVPB premix pgbk       ??? DOPamine in 5 % dextrose (INTROPIN) 800 mg/500 mL (1,600 mcg/mL) infusion  0-10 mcg/kg/min IntraVENous TITRATE   ??? insulin glargine (LANTUS) injection 20 Units  20 Units SubCUTAneous DAILY   ??? dextrose 5 % - 0.45% NaCl infusion  125 mL/hr IntraVENous CONTINUOUS   ??? pantoprazole (PROTONIX) 40 mg in sodium chloride 0.9 % 10 mL injection  40 mg IntraVENous DAILY   ??? insulin lispro (HUMALOG) injection   SubCUTAneous Q4H   ??? glucose chewable tablet 16 g  4 Tab Oral PRN   ??? glucagon (GLUCAGEN) injection 1 mg  1 mg IntraMUSCular PRN   ??? dextrose (D50W) injection syrg 12.5-25 g  25-50 mL IntraVENous PRN   ??? naloxone (NARCAN) 0.8 mg in 0.9% sodium chloride 500 mL infusion  0.4 mg/hr IntraVENous CONTINUOUS   ??? chlorhexidine (PERIDEX) 0.12 % mouthwash 10 mL  10 mL Oral Q12H   ??? albuterol-ipratropium (DUO-NEB) 2.5 MG-0.5 MG/3 ML  3 mL Nebulization Q4H PRN   ??? heparin (porcine) injection 5,000 Units  5,000 Units SubCUTAneous Q8H   ??? sodium chloride (NS) flush 5-10 mL  5-10 mL IntraVENous Q8H   ??? sodium chloride (NS) flush 5-10 mL  5-10 mL IntraVENous PRN       Recent Results (from the past 24 hour(s))   GLUCOSE, POC    Collection Time: 04/08/15  4:15 PM   Result Value Ref Range    Glucose (POC) 215 (H) 70 - 110 mg/dL   GLUCOSE, POC    Collection Time: 04/08/15  7:53 PM   Result Value Ref Range    Glucose (POC) 230 (H) 70 - 110 mg/dL   GLUCOSE, POC    Collection Time: 04/08/15 11:56 PM   Result Value Ref Range    Glucose (POC) 157 (H) 70 - 110 mg/dL   EKG, 12 LEAD, INITIAL    Collection Time: 04/09/15 12:37 AM   Result Value Ref Range    Ventricular Rate 50 BPM  Atrial Rate 50 BPM    P-R Interval 196 ms    QRS Duration 82 ms    Q-T Interval 440 ms    QTC Calculation (Bezet) 401 ms    Calculated P Axis 80 degrees     Calculated R Axis 43 degrees    Calculated T Axis -10 degrees    Diagnosis       Sinus bradycardia with blocked premature atrial complexes  Cannot rule out Inferior infarct , age undetermined  Abnormal ECG  When compared with ECG of 06-Apr-2015 11:44,  premature atrial complexes are now present  PR interval has decreased  Minimal criteria for Inferior infarct are now present     GLUCOSE, POC    Collection Time: 04/09/15 12:54 AM   Result Value Ref Range    Glucose (POC) 169 (H) 70 - 110 mg/dL   METABOLIC PANEL, BASIC    Collection Time: 04/09/15 12:58 AM   Result Value Ref Range    Sodium 144 136 - 145 mmol/L    Potassium 3.2 (L) 3.5 - 5.5 mmol/L    Chloride 111 (H) 100 - 108 mmol/L    CO2 26 21 - 32 mmol/L    Anion gap 7 3.0 - 18 mmol/L    Glucose 117 (H) 74 - 99 mg/dL    BUN 15 7.0 - 18 MG/DL    Creatinine 5.73 0.6 - 1.3 MG/DL    BUN/Creatinine ratio 19 12 - 20      GFR est AA >60 >60 ml/min/1.9m2    GFR est non-AA >60 >60 ml/min/1.22m2    Calcium 7.6 (L) 8.5 - 10.1 MG/DL   CBC W/O DIFF    Collection Time: 04/09/15 12:58 AM   Result Value Ref Range    WBC 18.7 (H) 4.6 - 13.2 K/uL    RBC 4.14 (L) 4.70 - 5.50 M/uL    HGB 12.0 (L) 13.0 - 16.0 g/dL    HCT 22.0 25.4 - 27.0 %    MCV 89.6 74.0 - 97.0 FL    MCH 29.0 24.0 - 34.0 PG    MCHC 32.3 31.0 - 37.0 g/dL    RDW 62.3 76.2 - 83.1 %    PLATELET 195 135 - 420 K/uL    MPV 11.1 9.2 - 11.8 FL   CARDIAC PANEL,(CK, CKMB & TROPONIN)    Collection Time: 04/09/15 12:58 AM   Result Value Ref Range    CK 69 39 - 308 U/L    CK - MB 1.3 0.5 - 3.6 ng/ml    CK-MB Index 1.9 0.0 - 4.0 %    Troponin-I, Qt. 0.02 0.00 - 0.06 NG/ML   MAGNESIUM    Collection Time: 04/09/15 12:58 AM   Result Value Ref Range    Magnesium 2.1 1.8 - 2.4 mg/dL   POTASSIUM    Collection Time: 04/09/15 12:58 AM   Result Value Ref Range    Potassium 3.0 (L) 3.5 - 5.5 mmol/L   PTT    Collection Time: 04/09/15 12:58 AM   Result Value Ref Range    aPTT 43.6 (H) 23.0 - 36.4 SEC   PROTHROMBIN TIME + INR     Collection Time: 04/09/15 12:58 AM   Result Value Ref Range    Prothrombin time 14.6 11.5 - 15.2 sec    INR 1.2 0.8 - 1.2     EKG, 12 LEAD, INITIAL    Collection Time: 04/09/15  3:49 AM   Result Value Ref Range    Ventricular  Rate 48 BPM    Atrial Rate 48 BPM    P-R Interval 188 ms    QRS Duration 94 ms    Q-T Interval 448 ms    QTC Calculation (Bezet) 400 ms    Calculated P Axis 78 degrees    Calculated R Axis 34 degrees    Calculated T Axis 45 degrees    Diagnosis       Marked sinus bradycardia with blocked premature atrial complexes  Abnormal ECG  When compared with ECG of 09-Apr-2015 00:37,  Nonspecific T wave abnormality no longer evident in Inferior leads     GLUCOSE, POC    Collection Time: 04/09/15  4:51 AM   Result Value Ref Range    Glucose (POC) 96 70 - 110 mg/dL   CARDIAC PANEL,(CK, CKMB & TROPONIN)    Collection Time: 04/09/15  7:00 AM   Result Value Ref Range    CK 63 39 - 308 U/L    CK - MB 0.9 0.5 - 3.6 ng/ml    CK-MB Index 1.4 0.0 - 4.0 %    Troponin-I, Qt. 0.02 0.00 - 0.06 NG/ML   GLUCOSE, POC    Collection Time: 04/09/15  8:05 AM   Result Value Ref Range    Glucose (POC) 113 (H) 70 - 110 mg/dL   GLUCOSE, POC    Collection Time: 04/09/15 11:38 AM   Result Value Ref Range    Glucose (POC) 109 70 - 110 mg/dL       Procedures/imaging: see electronic medical records for all procedures/Xrays and details which were not copied into this note but were reviewed prior to creation of Plan    Donney Dice, MD   04/09/2015, 12:37 PM

## 2015-04-09 NOTE — Consults (Addendum)
Will dictate consult note. No evidence of ACS. Huston Foley is due to non-conducted PAC's.      Marisue Ivan, MD

## 2015-04-09 NOTE — Progress Notes (Signed)
Chaplain conducted a Follow up consultation and Spiritual Assessment for Tracy [name], who is a 59 y.o.,male.      The Chaplain provided the following Interventions:  Continued the relationship of care and support.   Listened empathically.  Offered prayer and assurance of continued prayer on patients behalf.   Chart reviewed.    The following outcomes were achieved:  Patient expressed gratitude for pastoral care visit.    Assessment:  There are no further spiritual or religious issues which require Spiritual Care Services interventions at this time.     Plan:  Chaplains will continue to follow and will provide pastoral care on an as needed/requested basis.  Chaplain informed patient I will be all evening if he is in need of pastoral care.    Chaplain Glori Bickers  [phone]

## 2015-04-10 ENCOUNTER — Inpatient Hospital Stay: Payer: MEDICARE | Primary: Family Medicine

## 2015-04-10 ENCOUNTER — Inpatient Hospital Stay: Admit: 2015-04-10 | Payer: MEDICARE | Primary: Family Medicine

## 2015-04-10 LAB — CBC W/O DIFF
HCT: 37.8 % (ref 36.0–48.0)
HGB: 12.5 g/dL — ABNORMAL LOW (ref 13.0–16.0)
MCH: 29.5 PG (ref 24.0–34.0)
MCHC: 33.1 g/dL (ref 31.0–37.0)
MCV: 89.2 FL (ref 74.0–97.0)
MPV: 10.3 FL (ref 9.2–11.8)
PLATELET: 205 10*3/uL (ref 135–420)
RBC: 4.24 M/uL — ABNORMAL LOW (ref 4.70–5.50)
RDW: 13.5 % (ref 11.6–14.5)
WBC: 8.9 10*3/uL (ref 4.6–13.2)

## 2015-04-10 LAB — EKG, 12 LEAD, INITIAL
Atrial Rate: 50 {beats}/min
Atrial Rate: 48 {beats}/min
Calculated P Axis: 80 degrees
Calculated P Axis: 78 degrees
Calculated R Axis: 43 degrees
Calculated R Axis: 34 degrees
Calculated T Axis: -10 degrees
Calculated T Axis: 45 degrees
P-R Interval: 196 ms
P-R Interval: 188 ms
Q-T Interval: 440 ms
Q-T Interval: 448 ms
QRS Duration: 82 ms
QRS Duration: 94 ms
QTC Calculation (Bezet): 401 ms
QTC Calculation (Bezet): 400 ms
Ventricular Rate: 50 {beats}/min
Ventricular Rate: 48 {beats}/min

## 2015-04-10 LAB — GLUCOSE, POC
Glucose (POC): 107 mg/dL (ref 70–110)
Glucose (POC): 120 mg/dL — ABNORMAL HIGH (ref 70–110)
Glucose (POC): 127 mg/dL — ABNORMAL HIGH (ref 70–110)
Glucose (POC): 141 mg/dL — ABNORMAL HIGH (ref 70–110)
Glucose (POC): 189 mg/dL — ABNORMAL HIGH (ref 70–110)
Glucose (POC): 241 mg/dL — ABNORMAL HIGH (ref 70–110)

## 2015-04-10 LAB — PROTHROMBIN TIME + INR
INR: 1.1 (ref 0.8–1.2)
Prothrombin time: 13.6 s (ref 11.5–15.2)

## 2015-04-10 LAB — METABOLIC PANEL, BASIC
Anion gap: 5 mmol/L (ref 3.0–18)
BUN/Creatinine ratio: 15 (ref 12–20)
BUN: 13 MG/DL (ref 7.0–18)
CO2: 26 mmol/L (ref 21–32)
Calcium: 8 MG/DL — ABNORMAL LOW (ref 8.5–10.1)
Chloride: 109 mmol/L — ABNORMAL HIGH (ref 100–108)
Creatinine: 0.88 MG/DL (ref 0.6–1.3)
GFR est AA: >60 mL/min/{1.73_m2} (ref >60)
GFR est non-AA: >60 mL/min/{1.73_m2} (ref >60)
Glucose: 115 mg/dL — ABNORMAL HIGH (ref 74–99)
Potassium: 3.3 mmol/L — ABNORMAL LOW (ref 3.5–5.5)
Sodium: 140 mmol/L (ref 136–145)

## 2015-04-10 LAB — PTT: aPTT: 31.7 s (ref 23.0–36.4)

## 2015-04-10 MED ORDER — HEPARIN (PORCINE) IN D5W 25,000 UNIT/250 ML IV
25000 unit/250 mL(100 unit/mL) | INTRAVENOUS | Status: DC
Start: 2015-04-10 — End: 2015-04-11
  Administered 2015-04-10 – 2015-04-11 (×4): via INTRAVENOUS

## 2015-04-10 MED ORDER — POTASSIUM CHLORIDE SR 20 MEQ TAB, PARTICLES/CRYSTALS
20 mEq | ORAL | Status: AC
Start: 2015-04-10 — End: 2015-04-10
  Administered 2015-04-10: 20:00:00 via ORAL

## 2015-04-10 MED ORDER — SODIUM CHLORIDE 0.9 % IV PIGGY BACK
3.375 gram | Freq: Four times a day (QID) | INTRAVENOUS | Status: DC
Start: 2015-04-10 — End: 2015-04-13
  Administered 2015-04-10 – 2015-04-13 (×14): via INTRAVENOUS

## 2015-04-10 MED ORDER — PANTOPRAZOLE 40 MG TAB, DELAYED RELEASE
40 mg | Freq: Every day | ORAL | Status: DC
Start: 2015-04-10 — End: 2015-04-13
  Administered 2015-04-10 – 2015-04-13 (×4): via ORAL

## 2015-04-10 MED ORDER — INSULIN LISPRO 100 UNIT/ML INJECTION
100 unit/mL | Freq: Four times a day (QID) | SUBCUTANEOUS | Status: DC
Start: 2015-04-10 — End: 2015-04-11
  Administered 2015-04-10 – 2015-04-11 (×4): via SUBCUTANEOUS

## 2015-04-10 MED FILL — HEPARIN (PORCINE) 5,000 UNIT/ML IJ SOLN: 5000 unit/mL | INTRAMUSCULAR | Qty: 1

## 2015-04-10 MED FILL — PIPERACILLIN-TAZOBACTAM 3.375 GRAM IV SOLR: 3.375 gram | INTRAVENOUS | Qty: 3.38

## 2015-04-10 MED FILL — PANTOPRAZOLE 40 MG TAB, DELAYED RELEASE: 40 mg | ORAL | Qty: 1

## 2015-04-10 MED FILL — HUMALOG U-100 INSULIN 100 UNIT/ML SUBCUTANEOUS SOLUTION: 100 unit/mL | SUBCUTANEOUS | Qty: 3

## 2015-04-10 MED FILL — KLOR-CON M20 MEQ TABLET,EXTENDED RELEASE: 20 mEq | ORAL | Qty: 2

## 2015-04-10 MED FILL — HEPARIN (PORCINE) IN D5W 25,000 UNIT/250 ML IV: 25000 unit/250 mL(100 unit/mL) | INTRAVENOUS | Qty: 250

## 2015-04-10 MED FILL — INSULIN GLARGINE 100 UNIT/ML INJECTION: 100 unit/mL | SUBCUTANEOUS | Qty: 1

## 2015-04-10 NOTE — Progress Notes (Addendum)
Bedside and Verbal shift change report received from K.White,RN Physiological scientist). Report included the following information SBAR, Kardex, Intake/Output, MAR and Recent ResultsAlarm parameters reviewed and opportunities for questions provided.    2045 Shift assessment completed.    2345 Reassessment completed, no changed noted.     5320 Reassessment completed, no changed noted.

## 2015-04-10 NOTE — Procedures (Signed)
Hospital  *** FINAL REPORT ***    Name: Tracy [name], Tracy [name]  MRN: [id number]    Inpatient  DOB: 10 Dec 1955  HIS Order #: [id number]  TRAKnet Visit #: [id number]  Date: 10 Apr 2015    TYPE OF TEST: Peripheral Venous Testing    REASON FOR TEST  Pain in limb, Limb swelling    Right Arm:-  Deep venous thrombosis:           No  Superficial venous thrombosis:    Yes      INTERPRETATION/FINDINGS  Duplex images were obtained using 2-D gray scale, color flow and  spectral doppler analysis.  Right arm :  1. Deep vein(s) visualized include the subclavian, axillary and  brachial veins.  2. No evidence of deep venous thrombosis detected in the veins  visualized.  3. No evidence of deep vein thrombosis in the contralateral internal  jugular and subclavian veins.  4. No evidence of deep vein thrombosis in the contralateral internal  jugular vein.  5. No evidence of deep vein thrombosis in the contralateral subclavian   vein.  6. Superficial vein(s) visualized include the basilic (upper arm)  vein.  7. Non occlusive superficial venous thrombosis identified in the  basilic vein at the cubital fossa    ADDITIONAL COMMENTS    I have personally reviewed the data relevant to the interpretation of  this  study.    TECHNOLOGIST: Fatima Latif  Signed: 04/10/2015 02:47 PM    PHYSICIAN: Elston Aldape Hyong Sruthi Maurer MD  Signed: 04/10/2015 03:22 PM

## 2015-04-10 NOTE — Progress Notes (Signed)
[facility]  Pulmonary, Critical Care, and Sleep Medicine         Name: Tracy [name] MRN: [id number]   DOB: 1956/01/26 Hospital: [facility]   Date: 04/10/2015        Subjective:   Pt feeling better.  No chest pain last night.  Denies n/v        Spoke with cardiology, agree with echo.   Believe bradycardia due to nonconducting PACs  Pt asymtpomatic this am.         This patient has been seen and evaluated at the request of Dr. Lucianne Muss for respiratory depression with drug overdose.  . Patient is a 59 y.o. male with multiple medical problems inculding dm, cad s/p stent x3, mi, htn,   Has past hx of overdose including amytryptlin.   Brought to ED unresponsive with respiratory rate of 4.  BS at presentation of 521.  SBP of 80s.         Pt was given narcan x2  And 2L of NS.   SBP improved.  Respiratory rate improved.   However pupils remain pinpoint.   Unresponsive noxious stimuli.  Started on narcan drip and insulin drip.      I was called by Dr Lucianne Muss about admission to ICU.  Debated about bipap. Per Dr. Lucianne Muss he was more awake, so agreed to try the bipap.  Seen patient at ER.  Mental status waxing and weaning,  Almost vomited infront of me.  bipap was stopped immeidately.  To protect the airway electively decided to intubate the patient.    Advised to start the ativan drip and stop the narcan drip.        Past Medical History   Diagnosis Date   ??? CAD (coronary artery disease)    ??? Diabetes (HCC)    ??? MI (myocardial infarction) (HCC)    ??? Hypertension    ??? High cholesterol    ??? Chronic pain    ??? Arthritis       Past Surgical History   Procedure Laterality Date   ??? Pr cardiac surg procedure unlist     ??? Hx coronary stent placement        Prior to Admission medications    Medication Sig Start Date End Date Taking? Authorizing Provider   pravastatin (PRAVACHOL) 80 mg tablet Take 1 Tab by mouth nightly. 10/29/14   Mohammed Corbin Ade, MD    amLODIPine (NORVASC) 10 mg tablet Take 1 Tab by mouth daily. 10/29/14   Mohammed Corbin Ade, MD   insulin glargine (LANTUS SOLOSTAR) 100 unit/mL (3 mL) pen 8 Units by SubCUTAneous route nightly. 10/29/14   Mohammed Corbin Ade, MD   oxyCODONE-acetaminophen (PERCOCET) 5-325 mg per tablet Take 1-2 Tabs by mouth every four (4) hours as needed for Pain. Max Daily Amount: 12 Tabs. 10/19/14   Frankey Poot, MD   pravastatin (PRAVACHOL) 80 mg tablet Take 80 mg by mouth nightly.    Historical Provider   docusate sodium (COLACE) 100 mg capsule Take 1 Cap by mouth two (2) times a day. 10/13/14   Junie Bame, PA   aspirin 81 mg chewable tablet Take 81 mg by mouth daily.    Phys Other, MD   clopidogrel (PLAVIX) 75 mg tablet Take 75 mg by mouth daily.    Phys Other, MD     No Known Allergies   History   Substance Use Topics   ??? Smoking status: Former Smoker   ??? Smokeless  tobacco: Not on file   ??? Alcohol Use: Yes      Family History   Problem Relation Age of Onset   ??? Diabetes Mother    ??? Heart Disease Mother    ??? Diabetes Father         Current Facility-Administered Medications   Medication Dose Route Frequency   ??? DOPamine in 5 % dextrose (INTROPIN) 800 mg/500 mL (1,600 mcg/mL) infusion  0-10 mcg/kg/min IntraVENous TITRATE   ??? insulin glargine (LANTUS) injection 20 Units  20 Units SubCUTAneous DAILY   ??? pantoprazole (PROTONIX) 40 mg in sodium chloride 0.9 % 10 mL injection  40 mg IntraVENous DAILY   ??? insulin lispro (HUMALOG) injection   SubCUTAneous Q4H   ??? naloxone (NARCAN) 0.8 mg in 0.9% sodium chloride 500 mL infusion  0.4 mg/hr IntraVENous CONTINUOUS   ??? chlorhexidine (PERIDEX) 0.12 % mouthwash 10 mL  10 mL Oral Q12H   ??? heparin (porcine) injection 5,000 Units  5,000 Units SubCUTAneous Q8H   ??? sodium chloride (NS) flush 5-10 mL  5-10 mL IntraVENous Q8H       Review of Systems:  Limited due to current status    Objective:   Vital Signs:     BP 136/63 mmHg   Pulse 70   Temp(Src) 97.9 ??F (36.6 ??C)   Resp 15   Ht  (1.803 m)   Wt 79.379 kg (175 lb)   BMI 24.42 kg/m2   SpO2 100%    O2 Device: Nasal cannula   O2 Flow Rate (L/min): 3 l/min   Temp (24hrs), Avg:97.9 ??F (36.6 ??C), Min:97.5 ??F (36.4 ??C), Max:98.2 ??F (36.8 ??C)       Intake/Output:   Last shift:         Last 3 shifts: 06/17 1901 - 06/19 0700  In: 4160 [P.O.:1260; I.V.:2900]  Out: 1000 [Urine:1000]    Intake/Output Summary (Last 24 hours) at 04/10/15 0732  Last data filed at 04/10/15 9147   Gross per 24 hour   Intake   1260 ml   Output    500 ml   Net    760 ml           Physical Exam:   General: comfortable   HEENT;  Pupils are reactive  Neck: No adenopathy or thyroid swelling  CVS: S1S2 no murmurs  RS: Mod AE bilaterally, decreased BS with crackles at bases  Abd: soft, non tender, no hepatosplenomegaly  Neuro: non focal, awake, alert  Extrm: no leg edema   Skin: no rash   rt fem line       Data review:   Labs:  Recent Labs      04/09/15   0058  04/08/15   0435   WBC  18.7*  12.7   HGB  12.0*  11.8*   HCT  37.1  36.3   PLT  195  196     Recent Labs      04/09/15   0058  04/08/15   0435   NA  144  143   K  3.2*   3.0*  4.1   CL  111*  109*   CO2  26  22   GLU  117*  213*   BUN  15  20*   CREA  0.80  0.86   CA  7.6*  7.7*   MG  2.1   --    INR  1.2   --      No results  for input(s): PH, PCO2, PO2, HCO3, FIO2 in the last 72 hours.    Imaging:  I have personally reviewed the patient???s radiographs and have reviewed the reports:     No Known Allergies   ??   Result Information    ?? Status Provider Status      ?? Final result (04/06/2015 ??8:55 AM) Open    ??   Study Result    ?? EXAM: CT head  ??  INDICATION: Headache and weakness  ??  COMPARISON: 08/28/2013  ??  TECHNIQUE: Axial CT imaging of the head was performed without intravenous  contrast.  ??  One or more dose reduction techniques were used on this CT: automated exposure  control, adjustment of the mAs and/or kVp according to patient's size, and   iterative reconstruction techniques. The specific techniques utilized on this CT  exam have been documented in the patient's electronic medical record.  ??  ??_______________  ??  FINDINGS:  ??  BRAIN AND POSTERIOR FOSSA: There is minimal periventricular matter  hypoattenuation present. The ventricles are stable in size and configuration.  Basilar cisterns are patent. Physiologic basal ganglia calcifications noted on  the right. There is no intracranial hemorrhage, mass effect, or midline shift.  There are no areas of abnormal parenchymal attenuation.  ??  EXTRA-AXIAL SPACES AND MENINGES: There are no abnormal extra-axial fluid  collections.  ??  CALVARIUM: Intact.  ??  SINUSES: Mucosal thickening of the anterior and posterior ethmoid air cells is  noted.  ??  OTHER: Atherosclerotic calcification of bilateral carotid siphons present.  ??  _______________  ??  IMPRESSION:  ??  ??  1. No acute intracranial abnormality. Of note, noncontrast head CT can be normal  in the context of early acute stroke.  2. Minimal periventricular white matter hypoattenuation, which are nonspecific,  likely pertains to sequela of chronic ischemic microvascular disease.  ??     No Known Allergies   ??   Result Information  ?? Status Provider Status      ?? Final result (04/06/2015 ??9:46 AM) Open    ??   Study Result    ?? Chest, single view  ??  Indication: Shortness of breath, chest pain, patient found unresponsive.  ??  Comparison: Several prior exams, most recently 10/25/2014.  ??  Findings:?? Portable upright AP view of the chest was obtained. The patient is  rotated on the provided projection of the associated foreshortening of the left  hemithorax. Lungs are underexpanded with mild accentuation of contrast to the  markings. No focal pneumonic consolidation, pneumothorax, or large pleural  effusion. Cardiac size images and contours are stable. No acute osseous  normality. Calcific tendinosis of the left shoulder rotator cuff present,  unchanged.  ??  ??  ??   Impression:  Underexpanded lungs with associated bronchovascular crowding. No radiographic  evidence of acute pulmonary pulmonary abnormality.  ??          IMPRESSION:   ?? Acute respiratory failure on MV resolved, tolerated the extubation yesterday  ?? Hyperkalemia resolved  ?? Drug overdose with cocaine and (possibly tricyclic give prior hx )  ?? Acute kidney injury due to volume depletion, improved  ?? Hypotension due to above and voluem depletion improved  ?? Hyperosmolar Nonketotic hyperglycemia improved with glucose stabilizer, off stabilizer,  resolved  ?? Hx of CAD  ?? Poorly controlled DM with HgA1c of 12.5  ?? Bradycardia,   ?? Intentional overdose of TCA and cocaine  RECOMMENDATIONS:   ?? Continue fluid   ?? Instruct RN to remove the rt fem CVC  ??  sc insulin  ?? Echo per cardiology  ?? DVT, PUD prophylaxis  ?? Psych eval will be helpful   ??   High complexity decision making was performed in this consultation and evaluation of this patient who is at high risk for decompensation with multiple organ involvement  Total critical care time spent rendering care exclusive of procedures:    minutes     Dahlia Client Iban Utz, MD

## 2015-04-10 NOTE — Progress Notes (Signed)
Problem: Mobility Impaired (Adult and Pediatric)  Goal: *Acute Goals and Plan of Care (Insert Text)  Physical Therapy Goals  Initiated 04/10/2015 and to be accomplished within 2-8 day(s)  1. Patient will move from supine to sit and sit to supine in bed with supervision/set-up.   2. Patient will transfer from bed to chair and chair to bed with supervision/set-up using the least restrictive device.  3. Patient will perform sit to stand with supervision/set-up.  4. Patient will ambulate with supervision/set-up for 150 feet with the least restrictive device.   5. Patient will ascend/descend 2-5 stairs with use of handrail(s) with minimal assistance/contact guard assist as needed for home entry.  PHYSICAL THERAPY EVALUATION    Patient: Tracy [name] (59 y.o. male)  Date: 04/10/2015  Primary Diagnosis: Unresponsive  Unresponsive        Precautions:   Fall      ASSESSMENT :  Based on the objective data described below, the patient presents with decreased functional mobility with regards to bed mobility, transfers, gait, balance, and tolerance to activity. The patient appears to be a poor historian and provided a vague history. Patient was agreeable to PT. Patient sat up to edge of bed with minimal assistance. Upon sitting up the patient reported feeling dizzy, which resolved with time. Patient then stood up to a rolling walker with minimal assistance. Patient then ambulated about 10 feet with miniaml assistance, requiring assistance to maintain his balance. Patient then returned to bed, seated upright in prep for lunch. Will continue PT to maximize the patient's safe and independent mobility. Recommend rehab vs home health pending future progress with PT.    Patient will benefit from skilled intervention to address the above impairments.  Patient???s rehabilitation potential is considered to be Good  Factors which may influence rehabilitation potential include:            None noted           Mental ability/status            Medical condition           Home/family situation and support systems           Safety awareness           Pain tolerance/management           Other:        PLAN :  Recommendations and Planned Interventions:             Bed Mobility Training                 Neuromuscular Re-Education             Transfer Training                       Orthotic/Prosthetic Training             Gait Training                              Modalities             Therapeutic Exercises              Edema Management/Control             Therapeutic Activities                Patient and Family Training/Education    Other (comment):    Frequency/Duration: Patient will be followed by physical therapy daily to address goals.  Discharge Recommendations: rehab vs home health  Further Equipment Recommendations for Discharge: N/A       SUBJECTIVE:   Patient stated ???I was up earlier all day.???      OBJECTIVE DATA SUMMARY:       Past Medical History   Diagnosis Date   ??? CAD (coronary artery disease)     ??? Diabetes (HCC)     ??? MI (myocardial infarction) (HCC)     ??? Hypertension     ??? High cholesterol     ??? Chronic pain     ??? Arthritis       Past Surgical History   Procedure Laterality Date   ??? Pr cardiac surg procedure unlist       ??? Hx coronary stent placement         Barriers to Learning/Limitations: yes;  cognitive  Compensate with: Verbal Cues  Prior Level of Function/Home Situation: Patient is a poor historian. Reports he lives with his fiance. Normally independent with all mobility.   Home Situation  Home Environment: Private residence  Living Alone: No  Support Systems: Spouse/Significant Other/Partner  Patient Expects to be Discharged to:: Private residence  Current DME Used/Available at Home: None  Critical Behavior:  Neurologic State: Alert  Orientation Level: Oriented X4  Cognition: Follows commands     Psychosocial  Patient Behaviors: Calm;Cooperative   Purposeful Interaction: Yes  Pt Identified Daily Priority: Clinical issues (comment)  [employer] Process: Nurture loving kindness;Teaching/learning;Attend basic human needs;Create healing environment;Supportive expression  Caring Interventions: Reassure;Therapeutic modalities  Reassure: Therapeutic listening;Acceptance;Informing;Caring rounds  Therapeutic Modalities: Humor;Intentional therapeutic touch  Strength:    Strength: Generally decreased, functional  Tone & Sensation:   Tone: Normal  Sensation: Intact    Range Of Motion:  AROM: Generally decreased, functional  PROM: Generally decreased, functional  Functional Mobility:  Bed Mobility:  Rolling: Contact guard assistance  Supine to Sit: Minimum assistance  Sit to Supine: Contact guard assistance  Scooting: Contact guard assistance  Transfers:  Sit to Stand: Minimum assistance  Stand to Sit: Minimum assistance  Balance:   Sitting: Intact  Standing: Impaired;With support  Standing - Static: Fair  Standing - Dynamic : Fair  Ambulation/Gait Training:  Distance (ft): 10 Feet (ft)  Assistive Device: Gait belt;Walker, rolling  Ambulation - Level of Assistance: Contact guard assistance;Minimal assistance  Gait Description (WDL): Exceptions to WDL  Gait Abnormalities: Decreased step clearance;Shuffling gait  Base of Support: Narrowed;Center of gravity altered  Stance: Time  Speed/Cadence: Slow;Shuffled  Step Length: Right shortened;Left shortened  Swing Pattern: Right asymmetrical;Left asymmetrical    Pain:  Pain Scale 1: Numeric (0 - 10)  Pain Intensity 1: 2  Pain Location 1: Arm  Pain Orientation 1: Right  Pain Description 1: Aching  Pain Intervention(s) 1: Rest  Activity Tolerance:   Poor  Please refer to the flowsheet for vital signs taken during this treatment.  After treatment:   [ ]          Patient left in no apparent distress sitting up in chair  [X]          Patient left in no apparent distress in bed  [X]          Call bell left within reach   [X]          Nursing notified  [ ]          Caregiver present  [ ]   Bed alarm activated      COMMUNICATION/EDUCATION:   [X]          Fall prevention education was provided and the patient/caregiver indicated understanding.  [X]          Patient/family have participated as able in goal setting and plan of care.  [X]          Patient/family agree to work toward stated goals and plan of care.  [ ]          Patient understands intent and goals of therapy, but is neutral about his/her participation.  [ ]          Patient is unable to participate in goal setting and plan of care.    Thank you for this referral.  Chales SalmonMichael R Jacobs   Time Calculation: 30 mins

## 2015-04-10 NOTE — Progress Notes (Addendum)
Hospitalist Progress Note    Patient: Tracy [name] MRN: [id number]  CSN: [id number]    Date of Birth: 1956-09-22  Age: 59 y.o.  Sex: male    DOA: 04/06/2015 LOS:  LOS: 4 days              IMPRESSION and Plan:    Kooper Godshall is a 59 y.o. male with   Patient Active Problem List    Diagnosis Date Noted   ??? Unresponsive 04/06/2015   ??? Acute respiratory failure (HCC) 04/06/2015   ??? Hyperkalemia 04/06/2015   ??? AKI (acute kidney injury) (HCC) 04/06/2015   ??? Hyperglycemia due to type 2 diabetes mellitus (HCC) 04/06/2015   ??? Encephalopathy 04/06/2015   ??? Cocaine abuse 04/06/2015   ??? CAD (coronary artery disease)    ??? Hypertension    ??? Uncontrolled type II diabetes mellitus (HCC) 10/19/2014   ??? Drug overdose 08/28/2013     Active Problems:    Drug overdose (08/28/2013)      Uncontrolled type II diabetes mellitus (HCC) (10/19/2014)      Unresponsive (04/06/2015)      Acute respiratory failure (HCC) (04/06/2015)      CAD (coronary artery disease) ()      Hypertension ()      Hyperkalemia (04/06/2015)      AKI (acute kidney injury) (HCC) (04/06/2015)      Hyperglycemia due to type 2 diabetes mellitus (HCC) (04/06/2015)      Encephalopathy (04/06/2015)      Cocaine abuse (04/06/2015)      Bradycardia      Plan    PT/OT eval today  Xray of RUE and [place] as ordered per pulmonary  OOB  Labs as ordered  appriciate pulmonary and cardiology input  Cont Lantus and SSI    Needs psych consult for sucidal ideation      Patient's condition is improving.        Recommend to continue hospitalization.  Discussed with patient.    Chief Complaints:   Chief Complaint   Patient presents with   ??? Unresponsive     SUBJECTIVE:  Pt is seen and examined. More awake and alert today. Co R arm pain    No CP No Fever, chills, Nausea, vomitting.           Review of systems:    General: No fevers or chills.  Cardiovascular: No chest pain or pressure. No palpitations.   Pulmonary: shortness of breath.   Gastrointestinal: No nausea, vomiting    PE:   Patient Vitals for the past 24 hrs:   BP Temp Pulse Resp SpO2   04/10/15 0800 - - 66 21 99 %   04/10/15 0730 139/65 mmHg - 66 18 99 %   04/10/15 0715 - - 67 19 -   04/10/15 0708 - 97.9 ??F (36.6 ??C) - - -   04/10/15 0700 142/65 mmHg - 69 18 97 %   04/10/15 0645 - - 60 19 98 %   04/10/15 0630 135/70 mmHg - 70 25 98 %   04/10/15 0600 136/63 mmHg - 70 15 100 %   04/10/15 0530 121/53 mmHg - 65 15 99 %   04/10/15 0500 117/53 mmHg - 63 - 97 %   04/10/15 0439 - - (!) 59 9 97 %   04/10/15 0438 - - 71 19 98 %   04/10/15 0437 - - 61 10 97 %   04/10/15 0436 - - 65 (!) 4 97 %  04/10/15 0435 - - 70 - -   04/10/15 0434 - - 60 (!) 0 97 %   04/10/15 0433 - - 62 - 98 %   04/10/15 0432 - - 66 9 98 %   04/10/15 0431 - - 62 14 -   04/10/15 0430 108/52 mmHg - 69 15 -   04/10/15 0429 - - 69 19 -   04/10/15 0415 - - 64 13 -   04/10/15 0400 126/53 mmHg - - - -   04/10/15 0346 - 97.9 ??F (36.6 ??C) - - -   04/10/15 0345 - - 71 25 100 %   04/10/15 0330 136/64 mmHg - 72 27 100 %   04/10/15 0315 - - 74 18 99 %   04/10/15 0300 122/52 mmHg - 68 16 -   04/10/15 0245 - - 66 (!) 0 98 %   04/10/15 0230 107/54 mmHg - 61 - 97 %   04/10/15 0215 - - 63 16 99 %   04/10/15 0200 114/54 mmHg - 62 20 98 %   04/10/15 0145 - - - - 99 %   04/10/15 0130 110/51 mmHg - (!) 55 19 -   04/10/15 0100 106/57 mmHg - (!) 59 19 98 %   04/10/15 0045 - - - - 99 %   04/10/15 0030 124/69 mmHg - (!) 49 17 100 %   04/10/15 0000 120/74 mmHg - 77 13 98 %   04/09/15 2338 - 98.2 ??F (36.8 ??C) - - -   04/09/15 2330 121/60 mmHg - (!) 57 14 -   04/09/15 2300 113/54 mmHg - (!) 51 17 -   04/09/15 2230 118/61 mmHg - 62 19 100 %   04/09/15 2205 112/53 mmHg - 66 21 100 %   04/09/15 2130 108/64 mmHg - (!) 54 8 100 %   04/09/15 2100 126/65 mmHg - 61 14 100 %   04/09/15 2032 111/50 mmHg - 83 (!) 0 100 %   04/09/15 2030 - - 70 25 97 %   04/09/15 2000 126/64 mmHg - 65 16 98 %   04/09/15 1930 110/68 mmHg - 63 16 98 %   04/09/15 1925 - 98.1 ??F (36.7 ??C) - - -    04/09/15 1900 103/81 mmHg - 75 30 99 %   04/09/15 1830 120/65 mmHg - 73 20 96 %   04/09/15 1800 120/62 mmHg - 77 25 98 %   04/09/15 1730 131/60 mmHg - 74 18 98 %   04/09/15 1700 119/65 mmHg - 72 14 99 %   04/09/15 1630 111/66 mmHg - 74 17 100 %   04/09/15 1600 114/67 mmHg - 60 19 98 %   04/09/15 1530 119/73 mmHg - 60 18 98 %   04/09/15 1500 126/55 mmHg 97.5 ??F (36.4 ??C) 72 16 100 %   04/09/15 1430 133/69 mmHg - 64 19 99 %   04/09/15 1400 113/67 mmHg - (!) 57 29 98 %   04/09/15 1330 113/72 mmHg - (!) 52 17 96 %   04/09/15 1300 135/61 mmHg - - 28 100 %   04/09/15 1230 104/67 mmHg - (!) 57 15 98 %   04/09/15 1200 (!) 76/57 mmHg - (!) 55 18 98 %   04/09/15 1130 116/81 mmHg - (!) 54 15 95 %   04/09/15 1100 99/56 mmHg - (!) 38 18 96 %   04/09/15 1050 - 97.5 ??F (36.4 ??C) - - -   04/09/15 1030 99/61 mmHg - Marland Kitchen)  54 17 98 %   04/09/15 1000 (!) 89/54 mmHg - (!) 58 14 96 %       Intake/Output Summary (Last 24 hours) at 04/10/15 0959  Last data filed at 04/10/15 0836   Gross per 24 hour   Intake   1260 ml   Output   1100 ml   Net    160 ml     Patient Vitals for the past 120 hrs:   Weight   04/06/15 0755 79.379 kg (175 lb)   04/08/15 1321 79.379 kg (175 lb)           HEENT: Perrla, EOMI; oral mucosa well prefused; Conjunctiva not injected  Neck: No JVD, Negative carotid bruits, normal pulses; No thyromegaly  Resp: CTA bilaterally; No wheezes or rales  CV: RRR s1s2 No murmur; No rubs; PMI not displaced  Abd: Positive Bowel Sounds, Soft, Nontender  Ext: No clubbing; No cyanosis; No edema  Neuro: Alert and oriented but depressed      Intake and Output:  Current Shift:  06/19 0701 - 06/19 1900  In: -   Out: 600 [Urine:600]  Last three shifts:  06/17 1901 - 06/19 0700  In: 4160 [P.O.:1260; I.V.:2900]  Out: 1000 [Urine:1000]    Lab/Data Reviewed:  Recent Results (from the past 8 hour(s))   GLUCOSE, POC    Collection Time: 04/10/15  3:49 AM   Result Value Ref Range    Glucose (POC) 120 (H) 70 - 110 mg/dL   GLUCOSE, POC     Collection Time: 04/10/15  7:12 AM   Result Value Ref Range    Glucose (POC) 107 70 - 110 mg/dL   CBC W/O DIFF    Collection Time: 04/10/15  7:50 AM   Result Value Ref Range    WBC 8.9 4.6 - 13.2 K/uL    RBC 4.24 (L) 4.70 - 5.50 M/uL    HGB 12.5 (L) 13.0 - 16.0 g/dL    HCT 95.1 88.4 - 16.6 %    MCV 89.2 74.0 - 97.0 FL    MCH 29.5 24.0 - 34.0 PG    MCHC 33.1 31.0 - 37.0 g/dL    RDW 06.3 01.6 - 01.0 %    PLATELET 205 135 - 420 K/uL    MPV 10.3 9.2 - 11.8 FL   METABOLIC PANEL, BASIC    Collection Time: 04/10/15  7:50 AM   Result Value Ref Range    Sodium 140 136 - 145 mmol/L    Potassium 3.3 (L) 3.5 - 5.5 mmol/L    Chloride 109 (H) 100 - 108 mmol/L    CO2 26 21 - 32 mmol/L    Anion gap 5 3.0 - 18 mmol/L    Glucose 115 (H) 74 - 99 mg/dL    BUN 13 7.0 - 18 MG/DL    Creatinine 9.32 0.6 - 1.3 MG/DL    BUN/Creatinine ratio 15 12 - 20      GFR est AA >60 >60 ml/min/1.40m2    GFR est non-AA >60 >60 ml/min/1.74m2    Calcium 8.0 (L) 8.5 - 10.1 MG/DL   PROTHROMBIN TIME + INR    Collection Time: 04/10/15  7:50 AM   Result Value Ref Range    Prothrombin time 13.6 11.5 - 15.2 sec    INR 1.1 0.8 - 1.2       Medications:  Current Facility-Administered Medications   Medication Dose Route Frequency   ??? piperacillin-tazobactam (ZOSYN) 3.375 g in 0.9% sodium chloride (MBP/ADV)  100 mL MBP  3.375 g IntraVENous Q6H   ??? insulin lispro (HUMALOG) injection   SubCUTAneous AC&HS   ??? pantoprazole (PROTONIX) tablet 40 mg  40 mg Oral ACB   ??? nitroglycerin (NITROSTAT) tablet 0.4 mg  0.4 mg SubLINGual Q5MIN PRN   ??? DOPamine in 5 % dextrose (INTROPIN) 800 mg/500 mL (1,600 mcg/mL) infusion  0-10 mcg/kg/min IntraVENous TITRATE   ??? insulin glargine (LANTUS) injection 20 Units  20 Units SubCUTAneous DAILY   ??? glucose chewable tablet 16 g  4 Tab Oral PRN   ??? glucagon (GLUCAGEN) injection 1 mg  1 mg IntraMUSCular PRN   ??? dextrose (D50W) injection syrg 12.5-25 g  25-50 mL IntraVENous PRN    ??? albuterol-ipratropium (DUO-NEB) 2.5 MG-0.5 MG/3 ML  3 mL Nebulization Q4H PRN   ??? heparin (porcine) injection 5,000 Units  5,000 Units SubCUTAneous Q8H   ??? sodium chloride (NS) flush 5-10 mL  5-10 mL IntraVENous Q8H   ??? sodium chloride (NS) flush 5-10 mL  5-10 mL IntraVENous PRN       Recent Results (from the past 24 hour(s))   GLUCOSE, POC    Collection Time: 04/09/15 11:38 AM   Result Value Ref Range    Glucose (POC) 109 70 - 110 mg/dL   CARDIAC PANEL,(CK, CKMB & TROPONIN)    Collection Time: 04/09/15  1:15 PM   Result Value Ref Range    CK 61 39 - 308 U/L    CK - MB <0.5 (L) 0.5 - 3.6 ng/ml    CK-MB Index CANNOT BE CALCULATED 0.0 - 4.0 %    Troponin-I, Qt. 0.11 (H) 0.00 - 0.06 NG/ML   GLUCOSE, POC    Collection Time: 04/09/15  4:29 PM   Result Value Ref Range    Glucose (POC) 151 (H) 70 - 110 mg/dL   GLUCOSE, POC    Collection Time: 04/09/15  8:02 PM   Result Value Ref Range    Glucose (POC) 141 (H) 70 - 110 mg/dL   GLUCOSE, POC    Collection Time: 04/09/15 11:56 PM   Result Value Ref Range    Glucose (POC) 127 (H) 70 - 110 mg/dL   GLUCOSE, POC    Collection Time: 04/10/15  3:49 AM   Result Value Ref Range    Glucose (POC) 120 (H) 70 - 110 mg/dL   GLUCOSE, POC    Collection Time: 04/10/15  7:12 AM   Result Value Ref Range    Glucose (POC) 107 70 - 110 mg/dL   CBC W/O DIFF    Collection Time: 04/10/15  7:50 AM   Result Value Ref Range    WBC 8.9 4.6 - 13.2 K/uL    RBC 4.24 (L) 4.70 - 5.50 M/uL    HGB 12.5 (L) 13.0 - 16.0 g/dL    HCT 16.1 09.6 - 04.5 %    MCV 89.2 74.0 - 97.0 FL    MCH 29.5 24.0 - 34.0 PG    MCHC 33.1 31.0 - 37.0 g/dL    RDW 40.9 81.1 - 91.4 %    PLATELET 205 135 - 420 K/uL    MPV 10.3 9.2 - 11.8 FL   METABOLIC PANEL, BASIC    Collection Time: 04/10/15  7:50 AM   Result Value Ref Range    Sodium 140 136 - 145 mmol/L    Potassium 3.3 (L) 3.5 - 5.5 mmol/L    Chloride 109 (H) 100 - 108 mmol/L    CO2 26 21 -  32 mmol/L    Anion gap 5 3.0 - 18 mmol/L    Glucose 115 (H) 74 - 99 mg/dL     BUN 13 7.0 - 18 MG/DL    Creatinine 1.61 0.6 - 1.3 MG/DL    BUN/Creatinine ratio 15 12 - 20      GFR est AA >60 >60 ml/min/1.67m2    GFR est non-AA >60 >60 ml/min/1.16m2    Calcium 8.0 (L) 8.5 - 10.1 MG/DL   PROTHROMBIN TIME + INR    Collection Time: 04/10/15  7:50 AM   Result Value Ref Range    Prothrombin time 13.6 11.5 - 15.2 sec    INR 1.1 0.8 - 1.2         Procedures/imaging: see electronic medical records for all procedures/Xrays and details which were not copied into this note but were reviewed prior to creation of Plan    Donney Dice, MD   04/10/2015, 12:37 PM

## 2015-04-10 NOTE — Wound Image (Signed)
Patient seen as a part of ICU rounds.  Patients skin is intact at this time.  Patient is repositioned frequently. Wound care will continue to monitor during admission.

## 2015-04-10 NOTE — Progress Notes (Signed)
Cardiology Progress Note      04/10/2015 7:11 PM    Admit Date: 04/06/2015    Admit Diagnosis: Unresponsive  Unresponsive      Subjective:     Tracy [name] has multiple issues.    BP 120/61 mmHg   Pulse 71   Temp(Src) 97.7 ??F (36.5 ??C)   Resp 22   Ht  (1.803 m)   Wt 79.379 kg (175 lb)   BMI 24.42 kg/m2   SpO2 97%  Current Facility-Administered Medications   Medication Dose Route Frequency   ??? piperacillin-tazobactam (ZOSYN) 3.375 g in 0.9% sodium chloride (MBP/ADV) 100 mL MBP  3.375 g IntraVENous Q6H   ??? insulin lispro (HUMALOG) injection   SubCUTAneous AC&HS   ??? pantoprazole (PROTONIX) tablet 40 mg  40 mg Oral ACB   ??? heparin 25,000 units in D5W 250 ml infusion  18-36 Units/kg/hr IntraVENous TITRATE   ??? nitroglycerin (NITROSTAT) tablet 0.4 mg  0.4 mg SubLINGual Q5MIN PRN   ??? DOPamine in 5 % dextrose (INTROPIN) 800 mg/500 mL (1,600 mcg/mL) infusion  0-10 mcg/kg/min IntraVENous TITRATE   ??? insulin glargine (LANTUS) injection 20 Units  20 Units SubCUTAneous DAILY   ??? glucose chewable tablet 16 g  4 Tab Oral PRN   ??? glucagon (GLUCAGEN) injection 1 mg  1 mg IntraMUSCular PRN   ??? dextrose (D50W) injection syrg 12.5-25 g  25-50 mL IntraVENous PRN   ??? albuterol-ipratropium (DUO-NEB) 2.5 MG-0.5 MG/3 ML  3 mL Nebulization Q4H PRN   ??? sodium chloride (NS) flush 5-10 mL  5-10 mL IntraVENous Q8H   ??? sodium chloride (NS) flush 5-10 mL  5-10 mL IntraVENous PRN         Objective:      Physical Exam:  BP 120/61 mmHg   Pulse 71   Temp(Src) 97.7 ??F (36.5 ??C)   Resp 22   Ht  (1.803 m)   Wt 79.379 kg (175 lb)   BMI 24.42 kg/m2   SpO2 97%  General Appearance:  Well developed, well nourished,alert and oriented x 3, and individual in no acute distress.   Ears/Nose/Mouth/Throat:   Hearing grossly normal.         Neck: Supple.   Chest:   Lungs clear to auscultation bilaterally.   Cardiovascular:  Regular rate and rhythm, S1, S2 normal, no murmur.   Abdomen:   Soft, non-tender, bowel sounds are active.    Extremities: No edema bilaterally.    Skin: Warm and dry.               Data Review:   Labs:    Recent Results (from the past 24 hour(s))   GLUCOSE, POC    Collection Time: 04/09/15  8:02 PM   Result Value Ref Range    Glucose (POC) 141 (H) 70 - 110 mg/dL   GLUCOSE, POC    Collection Time: 04/09/15 11:56 PM   Result Value Ref Range    Glucose (POC) 127 (H) 70 - 110 mg/dL   GLUCOSE, POC    Collection Time: 04/10/15  3:49 AM   Result Value Ref Range    Glucose (POC) 120 (H) 70 - 110 mg/dL   GLUCOSE, POC    Collection Time: 04/10/15  7:12 AM   Result Value Ref Range    Glucose (POC) 107 70 - 110 mg/dL   CBC W/O DIFF    Collection Time: 04/10/15  7:50 AM   Result Value Ref Range    WBC 8.9 4.6 -  13.2 K/uL    RBC 4.24 (L) 4.70 - 5.50 M/uL    HGB 12.5 (L) 13.0 - 16.0 g/dL    HCT 97.5 88.3 - 25.4 %    MCV 89.2 74.0 - 97.0 FL    MCH 29.5 24.0 - 34.0 PG    MCHC 33.1 31.0 - 37.0 g/dL    RDW 98.2 64.1 - 58.3 %    PLATELET 205 135 - 420 K/uL    MPV 10.3 9.2 - 11.8 FL   METABOLIC PANEL, BASIC    Collection Time: 04/10/15  7:50 AM   Result Value Ref Range    Sodium 140 136 - 145 mmol/L    Potassium 3.3 (L) 3.5 - 5.5 mmol/L    Chloride 109 (H) 100 - 108 mmol/L    CO2 26 21 - 32 mmol/L    Anion gap 5 3.0 - 18 mmol/L    Glucose 115 (H) 74 - 99 mg/dL    BUN 13 7.0 - 18 MG/DL    Creatinine 0.94 0.6 - 1.3 MG/DL    BUN/Creatinine ratio 15 12 - 20      GFR est AA >60 >60 ml/min/1.48m2    GFR est non-AA >60 >60 ml/min/1.61m2    Calcium 8.0 (L) 8.5 - 10.1 MG/DL   PROTHROMBIN TIME + INR    Collection Time: 04/10/15  7:50 AM   Result Value Ref Range    Prothrombin time 13.6 11.5 - 15.2 sec    INR 1.1 0.8 - 1.2     PTT    Collection Time: 04/10/15  8:16 AM   Result Value Ref Range    aPTT 31.7 23.0 - 36.4 SEC   GLUCOSE, POC    Collection Time: 04/10/15 11:32 AM   Result Value Ref Range    Glucose (POC) 189 (H) 70 - 110 mg/dL   GLUCOSE, POC    Collection Time: 04/10/15  3:38 PM   Result Value Ref Range    Glucose (POC) 241 (H) 70 - 110 mg/dL        Telemetry: normal sinus rhythm      Assessment:     Active Problems:    Drug overdose (08/28/2013)      Uncontrolled type II diabetes mellitus (HCC) (10/19/2014)      Unresponsive (04/06/2015)      Acute respiratory failure (HCC) (04/06/2015)      CAD (coronary artery disease) ()      Hypertension ()      Hyperkalemia (04/06/2015)      AKI (acute kidney injury) (HCC) (04/06/2015)      Hyperglycemia due to type 2 diabetes mellitus (HCC) (04/06/2015)      Encephalopathy (04/06/2015)      Cocaine abuse (04/06/2015)        Plan:     The patient is stable from the cardiac standpoint. His echocardiogram showed normal systolic left ventricular function. Will continue to monitor.    Marisue Ivan, MD

## 2015-04-10 NOTE — Progress Notes (Addendum)
0730: Assumed care of pt from Allayne Gitelman, RN. Alarm parameters reviewed, on and audible Appropriate for patient clinical condition. Pt alert and oriented to self, disoriented to time, place and situation. Sitter at bedside. Pt reoriented.     0800: Assessment complete. Pt c/o pain to right arm, skin taute, palpable pulses, good capillary refill. CVC to right groin removed.    0820: Order received for duplex to right upper extremity and xray.     0830: Dr Ramiro Harvest at bedside, updates provided. Order received for PT/OT    0840: Family at bedside.    1045: Dr Delane Ginger at bedside. Recommends cymbalta 30 mg. Sitter discontinued.    1200: Pt able to walk in room with PT short distance. Pt states he feels dizzy. Pt back to bed and set up for lunch.    1214: tech at bedside for duplex of right arm    1345: Order received for heparin gtt from Dr Barbee Cough    1530: family at bedside, VSS NAD. O2 decreased to 2 L NC    1630: Pt on RA tolerating well    Bedside and Verbal shift change report given to Foye Clock, RN (oncoming nurse) by Rudi Heap, RN (offgoing nurse). Report included the following information SBAR, Kardex, Intake/Output, MAR, Recent Results and Cardiac Rhythm SR with PACs, first degree AVB.
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
## 2015-04-10 NOTE — Procedures (Signed)
[facility]  *** FINAL REPORT ***    Name: Tracy [name], Tracy [name]  MRN: [id number]    Inpatient  DOB: Dec 07, 1955  HIS Order #: [id number]  TRAKnet Visit #: [id number]  Date: 10 Apr 2015    TYPE OF TEST: Peripheral Venous Testing    REASON FOR TEST  Pain in limb, Limb swelling    Right Arm:-  Deep venous thrombosis:           No  Superficial venous thrombosis:    Yes      INTERPRETATION/FINDINGS  Duplex images were obtained using 2-D gray scale, color flow and  spectral doppler analysis.  Right arm :  1. Deep vein(s) visualized include the subclavian, axillary and  brachial veins.  2. No evidence of deep venous thrombosis detected in the veins  visualized.  3. No evidence of deep vein thrombosis in the contralateral internal  jugular and subclavian veins.  4. No evidence of deep vein thrombosis in the contralateral internal  jugular vein.  5. No evidence of deep vein thrombosis in the contralateral subclavian   vein.  6. Superficial vein(s) visualized include the basilic (upper arm)  vein.  7. Non occlusive superficial venous thrombosis identified in the  basilic vein at the cubital fossa    ADDITIONAL COMMENTS    I have personally reviewed the data relevant to the interpretation of  this  study.    TECHNOLOGIST: Adriana Simas  Signed: 04/10/2015 02:47 PM    PHYSICIAN: Halil Rentz Wynonia Musty MD  Signed: 04/10/2015 03:22 PM

## 2015-04-11 LAB — CBC WITH AUTOMATED DIFF
ABS. BASOPHILS: 0 10*3/uL (ref 0.0–0.06)
ABS. EOSINOPHILS: 0.1 10*3/uL (ref 0.0–0.4)
ABS. LYMPHOCYTES: 3.4 10*3/uL (ref 0.9–3.6)
ABS. MONOCYTES: 0.9 10*3/uL (ref 0.05–1.2)
ABS. NEUTROPHILS: 5.9 10*3/uL (ref 1.8–8.0)
BASOPHILS: 0 % (ref 0–2)
EOSINOPHILS: 1 % (ref 0–5)
HCT: 35.4 % — ABNORMAL LOW (ref 36.0–48.0)
HGB: 11.7 g/dL — ABNORMAL LOW (ref 13.0–16.0)
LYMPHOCYTES: 33 % (ref 21–52)
MCH: 28.9 PG (ref 24.0–34.0)
MCHC: 33.1 g/dL (ref 31.0–37.0)
MCV: 87.4 FL (ref 74.0–97.0)
MONOCYTES: 9 % (ref 3–10)
MPV: 10.2 FL (ref 9.2–11.8)
NEUTROPHILS: 57 % (ref 40–73)
PLATELET: 213 10*3/uL (ref 135–420)
RBC: 4.05 M/uL — ABNORMAL LOW (ref 4.70–5.50)
RDW: 13 % (ref 11.6–14.5)
WBC: 10.3 10*3/uL (ref 4.6–13.2)

## 2015-04-11 LAB — GLUCOSE, POC
Glucose (POC): 228 mg/dL — ABNORMAL HIGH (ref 70–110)
Glucose (POC): 226 mg/dL — ABNORMAL HIGH (ref 70–110)
Glucose (POC): 198 mg/dL — ABNORMAL HIGH (ref 70–110)
Glucose (POC): 207 mg/dL — ABNORMAL HIGH (ref 70–110)

## 2015-04-11 LAB — METABOLIC PANEL, COMPREHENSIVE
A-G Ratio: 0.7 — ABNORMAL LOW (ref 0.8–1.7)
ALT (SGPT): 89 U/L — ABNORMAL HIGH (ref 16–61)
AST (SGOT): 20 U/L (ref 15–37)
Albumin: 2.2 g/dL — ABNORMAL LOW (ref 3.4–5.0)
Alk. phosphatase: 26 U/L — ABNORMAL LOW (ref 45–117)
Anion gap: 8 mmol/L (ref 3.0–18)
BUN/Creatinine ratio: 11 — ABNORMAL LOW (ref 12–20)
BUN: 9 MG/DL (ref 7.0–18)
Bilirubin, total: 0.4 MG/DL (ref 0.2–1.0)
CO2: 27 mmol/L (ref 21–32)
Calcium: 7.8 MG/DL — ABNORMAL LOW (ref 8.5–10.1)
Chloride: 106 mmol/L (ref 100–108)
Creatinine: 0.83 MG/DL (ref 0.6–1.3)
GFR est AA: >60 mL/min/{1.73_m2} (ref >60)
GFR est non-AA: >60 mL/min/{1.73_m2} (ref >60)
Globulin: 3.1 g/dL (ref 2.0–4.0)
Glucose: 209 mg/dL — ABNORMAL HIGH (ref 74–99)
Potassium: 3.6 mmol/L (ref 3.5–5.5)
Protein, total: 5.3 g/dL — ABNORMAL LOW (ref 6.4–8.2)
Sodium: 141 mmol/L (ref 136–145)

## 2015-04-11 LAB — PTT
aPTT: 109.7 s — ABNORMAL HIGH (ref 23.0–36.4)
aPTT: 122.6 s — ABNORMAL HIGH (ref 23.0–36.4)
aPTT: 87.7 s — ABNORMAL HIGH (ref 23.0–36.4)

## 2015-04-11 LAB — MAGNESIUM: Magnesium: 1.8 mg/dL (ref 1.8–2.4)

## 2015-04-11 LAB — PHOSPHORUS: Phosphorus: 3.1 MG/DL (ref 2.5–4.9)

## 2015-04-11 MED ORDER — RIVAROXABAN 15 MG TAB
15 mg | Freq: Two times a day (BID) | ORAL | Status: DC
Start: 2015-04-11 — End: 2015-04-11

## 2015-04-11 MED ORDER — ACETAMINOPHEN 325 MG TABLET
325 mg | Freq: Four times a day (QID) | ORAL | Status: DC | PRN
Start: 2015-04-11 — End: 2015-04-13
  Administered 2015-04-11 – 2015-04-12 (×2): via ORAL

## 2015-04-11 MED ORDER — DULOXETINE 30 MG CAP, DELAYED RELEASE
30 mg | Freq: Every day | ORAL | Status: DC
Start: 2015-04-11 — End: 2015-04-13
  Administered 2015-04-11 – 2015-04-13 (×3): via ORAL

## 2015-04-11 MED ORDER — INSULIN LISPRO 100 UNIT/ML INJECTION
100 unit/mL | Freq: Four times a day (QID) | SUBCUTANEOUS | Status: DC
Start: 2015-04-11 — End: 2015-04-13
  Administered 2015-04-11 – 2015-04-13 (×8): via SUBCUTANEOUS

## 2015-04-11 MED ORDER — INSULIN LISPRO 100 UNIT/ML INJECTION
100 unit/mL | Freq: Three times a day (TID) | SUBCUTANEOUS | Status: DC
Start: 2015-04-11 — End: 2015-04-12
  Administered 2015-04-11 – 2015-04-12 (×3): via SUBCUTANEOUS

## 2015-04-11 MED ORDER — DULOXETINE 20 MG CAP, DELAYED RELEASE
20 mg | Freq: Two times a day (BID) | ORAL | Status: DC
Start: 2015-04-11 — End: 2015-04-11

## 2015-04-11 MED FILL — CYMBALTA 30 MG CAPSULE,DELAYED RELEASE: 30 mg | ORAL | Qty: 1

## 2015-04-11 MED FILL — XARELTO 15 MG TABLET: 15 mg | ORAL | Qty: 1

## 2015-04-11 MED FILL — PIPERACILLIN-TAZOBACTAM 3.375 GRAM IV SOLR: 3.375 gram | INTRAVENOUS | Qty: 3.38

## 2015-04-11 MED FILL — PANTOPRAZOLE 40 MG TAB, DELAYED RELEASE: 40 mg | ORAL | Qty: 1

## 2015-04-11 MED FILL — HEPARIN (PORCINE) IN D5W 25,000 UNIT/250 ML IV: 25000 unit/250 mL(100 unit/mL) | INTRAVENOUS | Qty: 250

## 2015-04-11 MED FILL — INSULIN GLARGINE 100 UNIT/ML INJECTION: 100 unit/mL | SUBCUTANEOUS | Qty: 1

## 2015-04-11 MED FILL — MAPAP (ACETAMINOPHEN) 325 MG TABLET: 325 mg | ORAL | Qty: 2

## 2015-04-11 NOTE — Progress Notes (Addendum)
[facility]  Pulmonary, Critical Care, and Sleep Medicine         Name: Tracy [name] MRN: [id number]   DOB: 14-Mar-1956 Hospital: [facility]   Date: 04/11/2015        Subjective:   6/20  Patient awake and alert today in no distress.  Remains on heparin for superficial clot of basilic vein in arm.  No DVT of lower or upper extremities.  Would NOT anticoagulate this thus discussion about coumadin or xarelto is irrelevant.  No DVT and np identifiable risk factors. Current guidelines recommend superficial treatment NSAIDS and warm compress no anticoagulation       This patient has been seen and evaluated at the request of Dr. Lucianne Muss for respiratory depression with drug overdose.  . Patient is a 59 y.o. male with multiple medical problems inculding dm, cad s/p stent x3, mi, htn,   Has past hx of overdose including amytryptlin.   Brought to ED unresponsive with respiratory rate of 4.  BS at presentation of 521.  SBP of 80s.         Pt was given narcan x2  And 2L of NS.   SBP improved.  Respiratory rate improved.   However pupils remain pinpoint.   Unresponsive noxious stimuli.  Started on narcan drip and insulin drip.      I was called by Dr Lucianne Muss about admission to ICU.  Debated about bipap. Per Dr. Lucianne Muss he was more awake, so agreed to try the bipap.  Seen patient at ER.  Mental status waxing and weaning,  Almost vomited infront of me.  bipap was stopped immeidately.  To protect the airway electively decided to intubate the patient.    Advised to start the ativan drip and stop the narcan drip.        Past Medical History   Diagnosis Date   ??? CAD (coronary artery disease)    ??? Diabetes (HCC)    ??? MI (myocardial infarction) (HCC)    ??? Hypertension    ??? High cholesterol    ??? Chronic pain    ??? Arthritis       Past Surgical History   Procedure Laterality Date   ??? Pr cardiac surg procedure unlist     ??? Hx coronary stent placement        Prior to Admission medications     Medication Sig Start Date End Date Taking? Authorizing Provider   pravastatin (PRAVACHOL) 80 mg tablet Take 1 Tab by mouth nightly. 10/29/14   Mohammed Corbin Ade, MD   amLODIPine (NORVASC) 10 mg tablet Take 1 Tab by mouth daily. 10/29/14   Mohammed Corbin Ade, MD   insulin glargine (LANTUS SOLOSTAR) 100 unit/mL (3 mL) pen 8 Units by SubCUTAneous route nightly. 10/29/14   Mohammed Corbin Ade, MD   oxyCODONE-acetaminophen (PERCOCET) 5-325 mg per tablet Take 1-2 Tabs by mouth every four (4) hours as needed for Pain. Max Daily Amount: 12 Tabs. 10/19/14   Frankey Poot, MD   pravastatin (PRAVACHOL) 80 mg tablet Take 80 mg by mouth nightly.    Historical Provider   docusate sodium (COLACE) 100 mg capsule Take 1 Cap by mouth two (2) times a day. 10/13/14   Junie Bame, PA   aspirin 81 mg chewable tablet Take 81 mg by mouth daily.    Phys Other, MD     No Known Allergies   History   Substance Use Topics   ??? Smoking status: Former Smoker   ???  Smokeless tobacco: Not on file   ??? Alcohol Use: Yes      Family History   Problem Relation Age of Onset   ??? Diabetes Mother    ??? Heart Disease Mother    ??? Diabetes Father         Current Facility-Administered Medications   Medication Dose Route Frequency   ??? insulin lispro (HUMALOG) injection   SubCUTAneous AC&HS   ??? insulin lispro (HUMALOG) injection 5 Units  5 Units SubCUTAneous TIDAC   ??? DULoxetine (CYMBALTA) capsule 30 mg  30 mg Oral DAILY   ??? rivaroxaban (XARELTO) tablet 15 mg  15 mg Oral BID WITH MEALS   ??? piperacillin-tazobactam (ZOSYN) 3.375 g in 0.9% sodium chloride (MBP/ADV) 100 mL MBP  3.375 g IntraVENous Q6H   ??? pantoprazole (PROTONIX) tablet 40 mg  40 mg Oral ACB   ??? insulin glargine (LANTUS) injection 20 Units  20 Units SubCUTAneous DAILY   ??? sodium chloride (NS) flush 5-10 mL  5-10 mL IntraVENous Q8H       Review of Systems:  Limited due to current status    Objective:   Vital Signs:     BP 148/81 mmHg   Pulse 75   Temp(Src) 98.2 ??F (36.8 ??C)   Resp 20   Ht  (1.803 m)   Wt 79.379 kg (175 lb)   BMI 24.42 kg/m2   SpO2 97%    O2 Device: Room air   O2 Flow Rate (L/min): 2 l/min   Temp (24hrs), Avg:98.1 ??F (36.7 ??C), Min:97.7 ??F (36.5 ??C), Max:98.3 ??F (36.8 ??C)       Intake/Output:   Last shift:      06/20 0701 - 06/20 1900  In: 240 [P.O.:240]  Out: 760 [Urine:760]  Last 3 shifts: 06/18 1901 - 06/20 0700  In: 3199.9 [P.O.:2580; I.V.:619.9]  Out: 3075 [Urine:3075]    Intake/Output Summary (Last 24 hours) at 04/11/15 1248  Last data filed at 04/11/15 1121   Gross per 24 hour   Intake 2499.93 ml   Output   3035 ml   Net -535.07 ml           Physical Exam:   General: comfortable interactive questions answered  HEENT;  Pupils are reactive  Neck: No adenopathy   CVS: S1S2 no murmurs  RS: Mod AE bilaterally, decreased BS with crackles at bases  Abd: soft, non tender, Neuro: non focal, awake, alert  Extrm: no leg edema   Skin: no rash       Data review:   Labs:  Recent Labs      04/11/15   0420  04/10/15   0750  04/09/15   0058   WBC  10.3  8.9  18.7*   HGB  11.7*  12.5*  12.0*   HCT  35.4*  37.8  37.1   PLT  213  205  195     Recent Labs      04/11/15   0420  04/10/15   0750  04/09/15   0058   NA  141  140  144   K  3.6  3.3*  3.2*   3.0*   CL  106  109*  111*   CO2  GLU  209*  115*  117*   BUN  CREA  0.83  0.88  0.80   CA  7.8*  8.0*  7.6*   MG  1.8   --   2.1   PHOS  3.1   --    --    ALB  2.2*   --    --    SGOT  20   --    --    ALT  89*   --    --    INR   --   1.1  1.2         IMPRESSION:   ?? Acute respiratory failure on MV resolved, tolerated the extubation yesterday              Drug overdose with cocaine and (possibly tricyclic give prior hx )  ?? Hyperosmolar Nonketotic hyperglycemia improved with glucose stabilizer, off stabilizer,  resolved  ?? Hx of CAD  ?? Poorly controlled DM with HgA1c of 12.5  ?? Bradycardia resolved NSR  ?? Intentional overdose of TCA and cocaine       RECOMMENDATIONS:   ?? Continue fluids tolerating diet  ?? Femoral line out, no DVT clot thus no anticoagulation, would not anticoagulate arm superficial clot  ??  sc insulin  ?? Echo per cardiology  ?? DVT, PUD prophylaxis  ?? Psych cleared transfer to floor 1-2 days additional abx then discharge as early as tomorrow  ?? Start cymbalta recommended by psych   ??        Hester Mates, MD

## 2015-04-11 NOTE — Progress Notes (Signed)
NUTRITION reassessment    Nutrition reassessment/follow up CCD #5.    RECOMMENDATIONS / PLAN:   Meals/Snacks: Continue consistent carbohydrate diet    Continuing to follow monitoring po intake, wt status, skin integrity adjusting recommendations & goals as appropriate to meet estimated needs.     Intervention: Coordination of Nutrition Care: N/A  NUTRITION INTERVENTIONS:      Meals/Snacks: Therapeutic diet, Modified texture diet    Medical food supplementation   Vitamin and mineral supplementation   Nutrition-related medication management   Monitor PO intake of meals   Monitor weights/weight trends   Nutrition counseling/education   Coordination of nutrition care   Continue inpatient monitoring and intervention    ASSESSMENT:     59 yo male admitted with drug overdose, uncontrolled DM, ARF, CAD, HTN, hypokalemia, hyperglycemia, AKI, encephalopathy, cocaine abuse. Pt has been extubated and diet advanced to consistent carbohydrate.     Pt is currently 101% of his SBW, BMI 24.4 kg/m^2 (Normal). Pt is consuming 100% of most meals. BG remains elevated, glycemic control RD following for diabetes and insulin management. Skin remains intact.     Continuing to follow and will adjust recommendations as appropriate.     Diet:   Consistent carb  Food Allergies:   NKFA      Other ()  Meal Intake: Patient Vitals for the past 100 hrs:   % Diet Eaten   04/11/15 0740 100 %   04/10/15 1744 100 %   04/10/15 1250 90 %   04/10/15 1000 100 %   04/09/15 1847 90 %   04/09/15 1500 75 %   04/09/15 1212 100 %       Skin:   Intact      Surgical Incision      Pressure Ulcer      Other    Pertinent Medications: Reviewed     Labs:  Recent Labs      04/11/15   0420  04/10/15   0750  04/09/15   0058   NA  141  140  144   K  3.6  3.3*  3.2*   3.0*   CL  106  109*  111*   CO2  27  26  26    GLU  209*  115*  117*   BUN  9  13  15    CREA  0.83  0.88  0.80   CA  7.8*  8.0*  7.6*   MG  1.8   --   2.1   PHOS  3.1   --    --    ALB  2.2*   --    --     SGOT  20   --    --    ALT  89*   --    --          Intake/Output Summary (Last 24 hours) at 04/11/15 1411  Last data filed at 04/11/15 1121   Gross per 24 hour   Intake 2259.93 ml   Output   2585 ml   Net -325.07 ml       Anthropometrics:   Current Height: 5' 11"  (180.3 cm)     Current Weight: 79.379 kg (175 lb)       BMI: 24.5 kg/m^2 (Normal)  IBW: 78.019 kg (172 lb) (101.74 %)  Weight History:   Weight Metrics 04/08/2015 10/28/2014 10/18/2014 10/13/2014 06/08/2014 08/28/2013 06/13/2013   Weight 175 lb 175 lb 14.4 oz 185 lb 195 lb 170  lb 172 lb 12.8 oz 175 lb   BMI 24.42 kg/m2 24.54 kg/m2 25.81 kg/m2 27.21 kg/m2 23.72 kg/m2 24.11 kg/m2 24.42 kg/m2              Admitting Diagnosis: Unresponsive  Unresponsive  Past Medical History   Diagnosis Date   ??? CAD (coronary artery disease)    ??? Diabetes ([place])    ??? MI (myocardial infarction) ([place])    ??? Hypertension    ??? High cholesterol    ??? Chronic pain    ??? Arthritis    ??? Major depressive disorder ([place]) 04/11/2015       Education Needs:         None identified   Identified and addressed - refer to education log  Learning Limitations:    None identified   Identified    Cultural, religious & ethnic food preferences identified:   None     Yes     NUTRITION NEEDS & DIAGNOSIS:     Estimated Nutrition Needs:  Calories (kcal): 2109 Kcals/day ([place] x 1.3)   Protein (g): 88 g  Carbohydrates (g): at least 130 g/d    Fluid (ml): 1975 ml (25-30 ml/kg = 3329-5188 )    Weight Used:  Actual wt      Patient expected to meet estimated nutrient needs:    Yes     No      Nutrition Diagnosis:Altered nutrition-related lab values related to uncontrolled DM as evidenced by pt with elevated BG levels, HBA1C ^12.5, estimated average glucose 312 mg/dl.        MONITORING & EVALUATION:     Nutrition Goals:  1. Po intake of meals will meet >75% of patient estimated nutritional needs daily.    2. Patient will maintain intact skin and avoid significant wt changes during admission.   Outcome:    Met/Ongoing     Progressing     Not Progressing     Not At Risk      Previous Recommendations (for follow-ups only):        Implemented          Not Implemented (RD to address)        N/A        Participated in [place] Reviewed/Documented (Refer to Careplan section of EMR)   Discharge Nutrition Plan:  Pt will consume adequate po intake to meet estimated nutritional needs.       Toney Sang, RD   Pager: [phone]

## 2015-04-11 NOTE — Wound Image (Signed)
Patient seen as a part of ICU rounds.  Patients skin is intact at this time.  Patient is repositioned frequently. Wound care will continue to monitor during admission.

## 2015-04-11 NOTE — Progress Notes (Addendum)
0715: Assumed care of pt from T Desma Paganini, RN. Alarm parameters reviewed, on and audible Appropriate for patient clinical condition. Heparin gtt verified with off-going RN. Pt eating breakfast. VSS NAD    0744: Assessment complete. Pt states he was going to pain management as an outpt and was taking percocet and dilaudid at home. Requesting pain medication for his chronic back and leg pain. Encouraged pt to discuss pain issues with hospitalist when they round given admitting diagnosis and psych recommendations.    0820: + blood culture in anaerobic bottle from 04/10/15 for gram + cocci in clusters. Pt currently on zosyn.    1044: IDR complete. Pt to be transitioned off heparin gtt to either coumadin or xarelto.  Cymbalta ordered.    1400: Pt sitting in chair after working with PT. VSS NAD    1517: Per Dr Maple Hudson pt does not need xarelto.      1658: Pt c/o of pain to left leg. Pt ambulating to restroom, able to void and assisted back to chair. Pt states he "always" has pain to the leg and states "that is way I am in pain management"    1700: Dr Denyse Dago paged and returned call. Order received for tylenol prn q 6 hours.    1848: pt unhooking self from monitor and walking into restroom.  Pt made aware that it is inappropriate to be unhooking self from the monitor for safety reasons. Pt back to bed and reconnected to monitor.     Bedside and Verbal shift change report given to Allayne Gitelman, RN (oncoming nurse) by Rudi Heap, RN (offgoing nurse). Report included the following information SBAR, Kardex, Intake/Output, MAR, Recent Results and Cardiac Rhythm SR with 1st degree AVB. Alarm parameters reviewed and opportunities for questions provided.

## 2015-04-11 NOTE — Progress Notes (Signed)
Cardiology Progress Note      04/11/2015 9:43 AM    Admit Date: 04/06/2015    Admit Diagnosis: Unresponsive  Unresponsive      Subjective:     Tracy [name] has multiple issues.    BP 146/79 mmHg   Pulse 70   Temp(Src) 98.3 ??F (36.8 ??C)   Resp 30   Ht 5' 11"  (1.803 m)   Wt 79.379 kg (175 lb)   BMI 24.42 kg/m2   SpO2 94%  Current Facility-Administered Medications   Medication Dose Route Frequency   ??? insulin lispro (HUMALOG) injection   SubCUTAneous AC&HS   ??? insulin lispro (HUMALOG) injection 5 Units  5 Units SubCUTAneous TIDAC   ??? piperacillin-tazobactam (ZOSYN) 3.375 g in 0.9% sodium chloride (MBP/ADV) 100 mL MBP  3.375 g IntraVENous Q6H   ??? pantoprazole (PROTONIX) tablet 40 mg  40 mg Oral ACB   ??? heparin 25,000 units in D5W 250 ml infusion  18-36 Units/kg/hr IntraVENous TITRATE   ??? nitroglycerin (NITROSTAT) tablet 0.4 mg  0.4 mg SubLINGual Q5MIN PRN   ??? insulin glargine (LANTUS) injection 20 Units  20 Units SubCUTAneous DAILY   ??? glucose chewable tablet 16 g  4 Tab Oral PRN   ??? glucagon (GLUCAGEN) injection 1 mg  1 mg IntraMUSCular PRN   ??? dextrose (D50W) injection syrg 12.5-25 g  25-50 mL IntraVENous PRN   ??? albuterol-ipratropium (DUO-NEB) 2.5 MG-0.5 MG/3 ML  3 mL Nebulization Q4H PRN   ??? sodium chloride (NS) flush 5-10 mL  5-10 mL IntraVENous Q8H   ??? sodium chloride (NS) flush 5-10 mL  5-10 mL IntraVENous PRN         Objective:      Physical Exam:  BP 146/79 mmHg   Pulse 70   Temp(Src) 98.3 ??F (36.8 ??C)   Resp 30   Ht 5' 11"  (1.803 m)   Wt 79.379 kg (175 lb)   BMI 24.42 kg/m2   SpO2 94%  General Appearance:  Well developed, well nourished,alert and oriented x 3, and individual in no acute distress.   Ears/Nose/Mouth/Throat:   Hearing grossly normal.         Neck: Supple.   Chest:   Lungs clear to auscultation bilaterally.   Cardiovascular:  Regular rate and rhythm, S1, S2 normal, no murmur.   Abdomen:   Soft, non-tender, bowel sounds are active.   Extremities: No edema bilaterally.    Skin: Warm and dry.                Data Review:   Labs:    Recent Results (from the past 24 hour(s))   GLUCOSE, POC    Collection Time: 04/10/15 11:32 AM   Result Value Ref Range    Glucose (POC) 189 (H) 70 - 110 mg/dL   GLUCOSE, POC    Collection Time: 04/10/15  3:38 PM   Result Value Ref Range    Glucose (POC) 241 (H) 70 - 110 mg/dL   PTT    Collection Time: 04/10/15  8:10 PM   Result Value Ref Range    aPTT 109.7 (H) 23.0 - 36.4 SEC   GLUCOSE, POC    Collection Time: 04/10/15  9:10 PM   Result Value Ref Range    Glucose (POC) 228 (H) 70 - 110 mg/dL   PTT    Collection Time: 04/11/15  2:15 AM   Result Value Ref Range    aPTT 122.6 (H) 23.0 - 36.4 SEC   CBC WITH AUTOMATED  DIFF    Collection Time: 04/11/15  4:20 AM   Result Value Ref Range    WBC 10.3 4.6 - 13.2 K/uL    RBC 4.05 (L) 4.70 - 5.50 M/uL    HGB 11.7 (L) 13.0 - 16.0 g/dL    HCT 35.4 (L) 36.0 - 48.0 %    MCV 87.4 74.0 - 97.0 FL    MCH 28.9 24.0 - 34.0 PG    MCHC 33.1 31.0 - 37.0 g/dL    RDW 13.0 11.6 - 14.5 %    PLATELET 213 135 - 420 K/uL    MPV 10.2 9.2 - 11.8 FL    NEUTROPHILS 57 40 - 73 %    LYMPHOCYTES 33 21 - 52 %    MONOCYTES 9 3 - 10 %    EOSINOPHILS 1 0 - 5 %    BASOPHILS 0 0 - 2 %    ABS. NEUTROPHILS 5.9 1.8 - 8.0 K/UL    ABS. LYMPHOCYTES 3.4 0.9 - 3.6 K/UL    ABS. MONOCYTES 0.9 0.05 - 1.2 K/UL    ABS. EOSINOPHILS 0.1 0.0 - 0.4 K/UL    ABS. BASOPHILS 0.0 0.0 - 0.06 K/UL    DF AUTOMATED     MAGNESIUM    Collection Time: 04/11/15  4:20 AM   Result Value Ref Range    Magnesium 1.8 1.8 - 2.4 mg/dL   METABOLIC PANEL, COMPREHENSIVE    Collection Time: 04/11/15  4:20 AM   Result Value Ref Range    Sodium 141 136 - 145 mmol/L    Potassium 3.6 3.5 - 5.5 mmol/L    Chloride 106 100 - 108 mmol/L    CO2 27 21 - 32 mmol/L    Anion gap 8 3.0 - 18 mmol/L    Glucose 209 (H) 74 - 99 mg/dL    BUN 9 7.0 - 18 MG/DL    Creatinine 0.83 0.6 - 1.3 MG/DL    BUN/Creatinine ratio 11 (L) 12 - 20      GFR est AA >60 >60 ml/min/1.16m    GFR est non-AA >60 >60 ml/min/1.763m    Calcium 7.8 (L) 8.5 - 10.1 MG/DL    Bilirubin, total 0.4 0.2 - 1.0 MG/DL    ALT 89 (H) 16 - 61 U/L    AST 20 15 - 37 U/L    Alk. phosphatase 26 (L) 45 - 117 U/L    Protein, total 5.3 (L) 6.4 - 8.2 g/dL    Albumin 2.2 (L) 3.4 - 5.0 g/dL    Globulin 3.1 2.0 - 4.0 g/dL    A-G Ratio 0.7 (L) 0.8 - 1.7     PHOSPHORUS    Collection Time: 04/11/15  4:20 AM   Result Value Ref Range    Phosphorus 3.1 2.5 - 4.9 MG/DL   GLUCOSE, POC    Collection Time: 04/11/15  6:46 AM   Result Value Ref Range    Glucose (POC) 226 (H) 70 - 110 mg/dL   PTT    Collection Time: 04/11/15  9:15 AM   Result Value Ref Range    aPTT 87.7 (H) 23.0 - 36.4 SEC       Telemetry: normal sinus rhythm      Assessment:     Active Problems:    Drug overdose (08/28/2013)      Uncontrolled type II diabetes mellitus ([place](10/19/2014)      Unresponsive (04/06/2015)      Acute respiratory failure [place](04/06[place])  CAD (coronary artery disease) ()      Hypertension ()      Hyperkalemia (04/06/2015)      AKI (acute kidney injury) ([place]) (04/06/2015)      Hyperglycemia due to type 2 diabetes mellitus ([place]) (04/06/2015)      Encephalopathy (04/06/2015)      Cocaine abuse (04/06/2015)        Plan:     Echo showed normal LV function (systolic). He is stable from cardiac standpoint. No further significant brady.    Rowland Lathe, MD

## 2015-04-11 NOTE — Progress Notes (Signed)
Problem: Mobility Impaired (Adult and Pediatric)  Goal: *Acute Goals and Plan of Care (Insert Text)  Physical Therapy Goals  Initiated 04/10/2015 and to be accomplished within 2-8 day(s)  1. Patient will move from supine to sit and sit to supine in bed with supervision/set-up.   2. Patient will transfer from bed to chair and chair to bed with supervision/set-up using the least restrictive device.  3. Patient will perform sit to stand with supervision/set-up.  4. Patient will ambulate with supervision/set-up for 150 feet with the least restrictive device.   5. Patient will ascend/descend 2-5 stairs with use of handrail(s) with minimal assistance/contact guard assist as needed for home entry.   Outcome: Progressing Towards Goal  PHYSICAL THERAPY TREATMENT    Patient: Tracy [name] (59 y.o. male)  Date: 04/11/2015  Diagnosis: Unresponsive  Unresponsive <principal problem not specified>  Precautions: Fall   Chart, physical therapy assessment, plan of care and goals were reviewed.      ASSESSMENT:  Dinner just arrived, and pt willing to sit in chair. Pt showing improved mobility and requiring less assistance. Pt reporting 9/10 pain to LLE (hip/thigh area). CGA needed for safety to chair. No LOB. Cont POC.  Progression toward goals:  [ ]       Improving appropriately and progressing toward goals  [X]       Improving slowly and progressing toward goals  [ ]       Not making progress toward goals and plan of care will be adjusted       PLAN:  Patient continues to benefit from skilled intervention to address the above impairments. Continue treatment per established plan of care.  Discharge Recommendations:  Rehab  Further Equipment Recommendations for Discharge:  rolling walker       SUBJECTIVE:   Patient stated ??? My left leg hurt ???      OBJECTIVE DATA SUMMARY:   Critical Behavior:  Neurologic State: Alert  Orientation Level: Oriented X4  Cognition: Appropriate decision making, Appropriate for age  attention/concentration, Appropriate safety awareness, Follows commands  Safety/Judgement: Awareness of environment, Good awareness of safety precautions, Insight into deficits  Functional Mobility Training:  Bed Mobility:  Rolling: Stand-by asssistance  Supine to Sit: Contact guard assistance  Scooting: Stand-by asssistance  Transfers:  Sit to Stand: Contact guard assistance  Stand to Sit: Contact guard assistance  Bed to Chair: Minimum assistance  Balance:  Sitting: Intact  Standing: Intact  Standing - Static: Fair  Standing - Dynamic : Fair  Ambulation/Gait Training:  Distance (ft): 5 Feet (ft) (side steps to chair )  Assistive Device: Gait belt  Ambulation - Level of Assistance: Contact guard assistance  Gait Abnormalities: Decreased step clearance;Antalgic;Step to gait  Base of Support: Narrowed  Speed/Cadence: Slow;Shuffled  Step Length: Right shortened;Left shortened  Swing Pattern: Left asymmetrical;Right asymmetrical    Pain:  Pain Scale 1: Numeric (0 - 10)  Pain Intensity 1: 0  Pain Location 1: Arm  Pain Orientation 1: Right  Pain Description 1: Throbbing  Pain Intervention(s) 1: Nurse notified  Activity Tolerance:   Fair     After treatment:   [X]  Patient left in no apparent distress sitting up in chair  [ ]  Patient left in no apparent distress in bed  [X]  Call bell left within reach  [X]  Nursing notified  [X]  Caregiver present  [ ]  Bed alarm activated      Westly Pam, PTA   Time Calculation: 15 mins

## 2015-04-11 NOTE — Progress Notes (Signed)
Called by staff to assist patient in completing an Advance Medical Directive.  Reviewed the form with patient and family at the bedside and left copies for them to review. Patient is not ready at this time and wants to read over it. He knows to call when he is ready to come.     Chaplain Marica Otter, M.Div.  Board Certified Chaplain  [phone] - Office

## 2015-04-11 NOTE — Progress Notes (Signed)
Problem: Self Care Deficits Care Plan (Adult)  Goal: *Acute Goals and Plan of Care (Insert Text)  Occupational Therapy Goals  Initiated 04/11/2015 within 7 day(s).    1. Patient will perform grooming with modified independence standing sinkside.  2. Patient will perform upper body dressing with modified independence.  3. Patient will perform lower body dressing with modified independence.  4. Patient will perform toilet transfers with supervision/set-up.  5. Patient will perform all aspects of toileting with supervision/set-up.  6. Patient will participate in upper extremity therapeutic exercise/activities with modified independence for 10 minutes.   7. Patient will utilize energy conservation techniques during functional activities without cues.  OCCUPATIONAL THERAPY EVALUATION    Patient: Tracy [name] (59 y.o. male)  Date: 04/11/2015  Primary Diagnosis: Unresponsive  Unresponsive        Precautions:   Fall      ASSESSMENT :  Based on the objective data described below, the patient presents with need for skilled occupational therapy evaluation and treatment. Pt presents with decreased independence in ADLs, functional mobility, functional transfers and functional mobility. Pt with overall decreased tolerance to activity and reporting 8/10 pain in (R)UE, nurse notified. Pt with lunch tray arrived. Pt required min A for supine to sit transfer and min A and RW for transfer bed to chair.  Pt CGA for transfer sit to stand and stand to sit. Pt sitting in chair. Set up provided for meal and pt independent with all container management and silverware use.  Further assessment needed for LB ADLs.  Pt sitting in chair and nurse notified.  All needs met and pt instructed to call for assistance to return to bed.      Patient will benefit from skilled intervention to address the above impairments.  Patient???s rehabilitation potential is considered to be Good  Factors which may influence rehabilitation potential include:    [ ]              None noted  [ ]              Mental ability/status  [X]              Medical condition  [X]              Home/family situation and support systems  [X]              Safety awareness  [ ]              Pain tolerance/management  [ ]              Other:        PLAN :  Recommendations and Planned Interventions:  [X]                Self Care Training                  [X]         Therapeutic Activities  [X]                Functional Mobility Training    [ ]         Cognitive Retraining  [X]                Therapeutic Exercises           [X]         Endurance Activities  [X]                Balance Training                   [ ]   Neuromuscular Re-Education  [ ]                Visual/Perceptual Training     [X]    Home Safety Training  [X]                Patient Education                 [X]         Family Training/Education  [ ]                Other (comment):    Frequency/Duration: Patient will be followed by occupational therapy 3-5 times per weekDischarge Recommendations: Rehab and Home Health  Further Equipment Recommendations for Discharge: rolling walker and 3 in 1 commode         SUBJECTIVE:   Patient stated ???I am planning to go home.???      OBJECTIVE DATA SUMMARY:       Past Medical History   Diagnosis Date   ??? CAD (coronary artery disease)     ??? Diabetes ([place])     ??? MI (myocardial infarction) ([place])     ??? Hypertension     ??? High cholesterol     ??? Chronic pain     ??? Arthritis     ??? Major depressive disorder ([place]) 04/11/2015     Past Surgical History   Procedure Laterality Date   ??? Pr cardiac surg procedure unlist       ??? Hx coronary stent placement         Prior Level of Function/Home Situation: Pt reports independence in ADLS PTA.    Home Situation  Home Environment: Apartment  # Steps to Enter: 1  One/Two Story Residence: One story  Living Alone: No  Support Systems: Spouse/Significant Other/Partner  Patient Expects to be Discharged to:: Private residence  Current DME Used/Available at Home: None   Tub or Shower Type: Tub/Shower combination  [ ]   Right hand dominant           [ ]   Left hand dominant  Cognitive/Behavioral Status:  Neurologic State: Alert  Orientation Level: Oriented X4  Cognition: Appropriate decision making;Appropriate for age attention/concentration;Appropriate safety awareness;Follows commands  Safety/Judgement: Awareness of environment;Good awareness of safety precautions;Insight into deficits  Skin: (B)UE intact  Edema: (B)UE none noted  Vision/Perceptual:    Coordination:  Coordination: Generally decreased, functional  Fine Motor Skills-Upper: Left Intact;Right Intact    Gross Motor Skills-Upper: Left Intact;Right Intact  Balance:  Sitting: Intact  Standing: Intact;With support  Standing - Static: Fair  Standing - Dynamic : Fair  Strength:  (B)UE    Strength: Generally decreased, functional  Tone & Sensation:  (B)UE    Tone: Normal  Sensation: Intact  Range of Motion:  (B)UE   AROM: Generally decreased, functional  PROM: Generally decreased, functional     Functional Mobility and Transfers for ADLs:  Bed Mobility:  Rolling: Contact guard assistance (HOB elevated)  Supine to Sit: Minimum assistance (HOB elevated)  Scooting: Contact guard assistance (sitting EOB)  Transfers:  Sit to Stand: Contact guard assistance  Bed to Chair: Minimum assistance  ADL Assessment:  Feeding: Setup (managed all containers and silverwear independently)  ADL Intervention:  Cognitive Retraining  Safety/Judgement: Awareness of environment;Good awareness of safety precautions;Insight into deficits  Therapeutic Exercise:  Pain:  Pain Scale 1: Numeric (0 - 10)  Pain Intensity 1: 8  Pain Location 1: Arm  Pain Orientation 1: Right  Pain Description 1: Throbbing  Pain Intervention(s)  1: Nurse notified  Activity Tolerance:   Pt tolerated activity well with no visible signs of increased pain or fatigue following treatment.    Please refer to the flowsheet for vital signs taken during this treatment.  After treatment:    [X]  Patient left in no apparent distress sitting up in chair  [X]  Patient left in no apparent distress in bed  [X]  Call bell left within reach  [ ]  Nursing notified  [ ]  Caregiver present  [ ]  Bed alarm activated      COMMUNICATION/EDUCATION:   [X]  Home safety education was provided and the patient/caregiver indicated understanding.  [X]  Patient/family have participated as able in goal setting and plan of care.  [X]  Patient/family agree to work toward stated goals and plan of care.  [ ]  Patient understands intent and goals of therapy, but is neutral about his/her participation.  [ ]  Patient is unable to participate in goal setting and plan of care.    Thank you for this referral.  Sherrie Mustache, OT  Time Calculation: 16 mins

## 2015-04-11 NOTE — Other (Signed)
Bedside and Verbal shift change report given to K.White,RN (oncoming nurse) . Report included the following information SBAR, Kardex, Intake/Output and MAR.Alarm parameters reviewed and opportunities for questions provided.

## 2015-04-11 NOTE — Progress Notes (Signed)
Met with pt and step mother at bedside in ICU. Pt anticipates discharge home where he uses no DME prior to admission.  Noted PT recommends home health vs rehab for pt.  CM will continue to follow for needs. Pt will likely discharge on anticoagulation. Pt has Medicaid which covers his medications with little/no copay.

## 2015-04-11 NOTE — Other (Signed)
CRITICAL CARE INTERDISCIPLINARY ROUNDS    Patient Information:    Name:   Tracy [name]    Age:   59 y.o.    Admission Date:   04/06/2015    Critical Care Day: 6    Surgery Date:      Attending Provider:   Shirline Frees Ashrafuddin Ame*    Surgeon:        Consultant(s):   Young    Critical Care Physician:  Maple Hudson    Code Status: Full Code    Problem List:     Patient Active Problem List   Diagnosis Code   ??? Drug overdose T50.901A   ??? Uncontrolled type II diabetes mellitus (HCC) E11.65   ??? Unresponsive R41.89   ??? Acute respiratory failure (HCC) J96.00   ??? CAD (coronary artery disease) I25.10   ??? Hypertension I10   ??? Hyperkalemia E87.5   ??? AKI (acute kidney injury) (HCC) N17.9   ??? Hyperglycemia due to type 2 diabetes mellitus (HCC) E11.65   ??? Encephalopathy G93.40   ??? Cocaine abuse F14.10       Principal Problem:  <principal problem not specified>    During rounds the following quality care indicators and evidence based practices were addressed :  DVT Prophylaxis Foley Day 5 ([facility]) ;Isolation:;     Ventilator Bundle:      Sepsis Order Set:     Glycemic Control:   Recent Labs      04/11/15   0420  04/10/15   0750  04/09/15   0058   GLU  209*  115*  117*   ;  No results for input(s): PHI, PCO2I, PO2I in the last 72 hours.    Invalid input(s): 3 Adjustments     Acute MI/PCI:   Not applicable    Door to Balloon: Admission Time: 0749      Heart Failure:    Not applicable     SCIP Measures for other Surgeries:   Not applicable     Pneumonia:    Not applicable    Interdisciplinary team rounds were held with the following team members Care Management,  Nursing, Nutrition, Pharmacy, Physician, and Clinical Coordinator. Plan of care discussed.     Goals of Care/ Recommendations: Start transition PO anticoagulation, started Cymbalta.     See clinical pathway and/or care plan for interventions and desired outcomes.    Critical Care Discharge Status:  Yellow/ green

## 2015-04-11 NOTE — Progress Notes (Signed)
Hospitalist Progress Note    Patient: Tracy [name] MRN: [id number]  CSN: [id number]    Date of Birth: Oct 07, 1956  Age: 59 y.o.  Sex: male    DOA: 04/06/2015 LOS:  LOS: 5 days          Chief Complaint: depression          Assessment/Plan     Acute respiratory failure off vent.  Drug overdose/intoxication.  AKI - resolved.  DM uncontrolled.  Dehydration improved.  Depression major.  CAD    Stabilized.  I sense that he is suffering from major depression.  Has considered suicide in the past.  No suicidal now he relates.  Tells that he a great deal of problems in his personal life.  Psych consult requested by weekend team.  Await expert recs.          Patient Active Problem List   Diagnosis Code   ??? Drug overdose T50.901A   ??? Uncontrolled type II diabetes mellitus (HCC) E11.65   ??? Unresponsive R41.89   ??? Acute respiratory failure (HCC) J96.00   ??? CAD (coronary artery disease) I25.10   ??? Hypertension I10   ??? Hyperkalemia E87.5   ??? AKI (acute kidney injury) (HCC) N17.9   ??? Hyperglycemia due to type 2 diabetes mellitus (HCC) E11.65   ??? Encephalopathy G93.40   ??? Cocaine abuse F14.10       Subjective:        Review of systems:    Constitutional: denies fevers, chills, myalgias  Respiratory: denies SOB, cough  Cardiovascular: denies chest pain, palpitations  Gastrointestinal: denies nausea, vomiting, diarrhea      Vital signs/Intake and Output:  Visit Vitals   Item Reading   ??? BP 148/81 mmHg   ??? Pulse 75   ??? Temp 98.2 ??F (36.8 ??C)   ??? Resp 20   ??? Ht 5\' 11"  (1.803 m)   ??? Wt 79.379 kg (175 lb)   ??? BMI 24.42 kg/m2   ??? SpO2 97%     Current Shift:  06/20 0701 - 06/20 1900  In: 240 [P.O.:240]  Out: 760 [Urine:760]  Last three shifts:  06/18 1901 - 06/20 0700  In: 3199.9 [P.O.:2580; I.V.:619.9]  Out: 3075 [Urine:3075]    Exam:    General: nad  Head/Neck: mmm  CVS:s1s2 rrr  Lungs:Ctab/l  Abdomen: Soft, bs+  Extremities: No C/C/E, pulses palpable 2+  Psych:depressed affect.                 Labs: Results:        Chemistry Recent Labs      04/11/15   0420  04/10/15   0750  04/09/15   0058   GLU  209*  115*  117*   NA  141  140  144   K  3.6  3.3*  3.2*   3.0*   CL  106  109*  111*   CO2  27  26  26    BUN  9  13  15    CREA  0.83  0.88  0.80   CA  7.8*  8.0*  7.6*   AGAP  8  5  7    BUCR  11*  15  19   AP  26*   --    --    TP  5.3*   --    --    ALB  2.2*   --    --    GLOB  3.1   --    --  AGRAT  0.7*   --    --       CBC w/Diff Recent Labs      04/11/15   0420  04/10/15   0750  04/09/15   0058   WBC  10.3  8.9  18.7*   RBC  4.05*  4.24*  4.14*   HGB  11.7*  12.5*  12.0*   HCT  35.4*  37.8  37.1   PLT  213  205  195   GRANS  57   --    --    LYMPH  33   --    --    EOS  1   --    --       Cardiac Enzymes Recent Labs      04/09/15   1315  04/09/15   0700   CPK  61  63   CKND1  CANNOT BE CALCULATED  1.4      Coagulation Recent Labs      04/11/15   0915  04/11/15   0215   04/10/15   0750  04/09/15   0058   PTP   --    --    --   13.6  14.6   INR   --    --    --   1.1  1.2   APTT  87.7*  122.6*   < >   --   43.6*    < > = values in this interval not displayed.       Lipid Panel No results found for: CHOL, CHOLPOCT, CHOLX, CHLST, CHOLV, [id number], HDL, LDL, NLDLCT, DLDL, LDLC, DLDLP, 161096, VLDLC, VLDL, TGL, TGLX, TRIGL, [id number], TRIGP, TGLPOCT, [id number], CHHD, CHHDX   BNP No results for input(s): BNPP in the last 72 hours.   Liver Enzymes Recent Labs      04/11/15   0420   TP  5.3*   ALB  2.2*   AP  26*   SGOT  20      Thyroid Studies No results found for: T4, T3U, TSH, TSHEXT     Procedures/imaging: see electronic medical records for all procedures/Xrays and details which were not copied into this note but were reviewed prior to creation of Plan      Delena Bali, MD

## 2015-04-11 NOTE — Progress Notes (Signed)
Chaplain conducted a Follow up consultation and Spiritual Assessment for Tracy [name], who is a 59 y.o.,male.      The Chaplain provided the following Interventions:  Continued the relationship of care and support.   Listened empathically.  Offered prayer and assurance of continued prayer on patients behalf.   Chart reviewed.    The following outcomes were achieved:  Patient expressed gratitude for Spiritual Care visit.    Assessment:  There are no further spiritual or religious issues which require Spiritual Care Services intervention at this time.     Plan:  Chaplains will continue to follow and will provide pastoral care as needed or requested.  Chaplain recommends bedside caregivers page the chaplain on duty if patient shows signs of acute spiritual or emotional distress.     [address], [place].   Board Certified Chaplain  [phone] - Office

## 2015-04-11 NOTE — Progress Notes (Signed)
Problem: Mobility Impaired (Adult and Pediatric)  Goal: *Acute Goals and Plan of Care (Insert Text)  Physical Therapy Goals  Initiated 04/10/2015 and to be accomplished within 2-8 day(s)  1. Patient will move from supine to sit and sit to supine in bed with supervision/set-up.   2. Patient will transfer from bed to chair and chair to bed with supervision/set-up using the least restrictive device.  3. Patient will perform sit to stand with supervision/set-up.  4. Patient will ambulate with supervision/set-up for 150 feet with the least restrictive device.   5. Patient will ascend/descend 2-5 stairs with use of handrail(s) with minimal assistance/contact guard assist as needed for home entry.   Pt refused PT due to:  [ ]   Nausea/vomiting  [X]   Eating. Pt just finished with OT and about to eat lunch that just arrived   [ ]   Pain  [ ]   Pt lethargic  [ ]   Off Unit  Will f/u later as schedule allows. Thank you.  Westly Pam, PTA

## 2015-04-11 NOTE — Progress Notes (Addendum)
@  1910 PT taken over awake alert oriented x4 , denies pain,vital signs stable. Pt in no distress. Relatives in with pt. Assessment done and charted in appropriate flow sheets . Nursing management continues.  @2100  no changes care continues.  @2300  no new developments nursing observation continues.  @0100  nursing care continues pt awake watching TV denies pain . Vital signs WNL.  @0300  Pt remains in stable condition at present observation continues, no chanages seen.  @0500  pt awake in no obvious distress care continues.  @0740  Bedside and Verbal shift change report given to Katherine Roan (oncoming nurse) by Aubery Lapping (offgoing nurse). Report included the following information.   Alarm parameters reviewed, on and audible Appropriate for patient clinical condition

## 2015-04-11 NOTE — Other (Signed)
GLYCEMIC CONTROL AND NUTRITION PLAN OF CARE    Assessment/Recommendations:  - Discussed in rounds, known h/o DM2  - BG out of target range, extuabed & eating well, recommend mealtime dose of Humalog 5 units qac (orders entered per Dr. Cathlean Cower)      Goal:  - BG will be in target range of 70-140 mg/dl (non-ICU) or 703-500 md/dl (ICU) within the next 24 hours    Most recent blood glucose values:   Lab Results   Component Value Date/Time    GLU 209* 04/11/2015 04:20 AM    GLUCPOC 226* 04/11/2015 06:46 AM    GLUCPOC 228* 04/10/2015 09:10 PM    GLUCPOC 241* 04/10/2015 03:38 PM               Current A1C is equivalent to average blood glucose of 312 mg/dl over the past 2-3 months.    Lab Results   Component Value Date/Time    HEMOGLOBIN A1C 12.5 04/06/2015 07:55 AM         Current hospital diabetes medications:   - Lantus 20 units every day  - Humalog Normal Insulin Sensitivity Corrective Coverage    Previous day's insulin requirements:   - 35 units total (20 Lantus, 15 Humalog (all corrective)    Home diabetes medications:  - Lantus 8 units qhs       Anthropometrics:   IBW : 78.019 kg (172 lb), % IBW (Calculated): 101.74 %     Body mass index is 24.42 kg/(m^2).  Last 3 Recorded Weights in this Encounter    04/06/15 0755 04/08/15 1321   Weight: 79.379 kg (175 lb) 79.379 kg (175 lb)    Ht Readings from Last 1 Encounters:   04/08/15 5\' 11"  (1.803 m)       Diet:    Active Orders   Diet    DIET DIABETIC CONSISTENT CARB Regular       Intake:   Patient Vitals for the past 100 hrs:   % Diet Eaten   04/11/15 0740 100 %   04/10/15 1744 100 %   04/10/15 1250 90 %   04/10/15 1000 100 %   04/09/15 1847 90 %   04/09/15 1500 75 %   04/09/15 1212 100 %             Elwyn Reach, RD

## 2015-04-12 LAB — GLUCOSE, POC
Glucose (POC): 193 mg/dL — ABNORMAL HIGH (ref 70–110)
Glucose (POC): 210 mg/dL — ABNORMAL HIGH (ref 70–110)
Glucose (POC): 142 mg/dL — ABNORMAL HIGH (ref 70–110)
Glucose (POC): 193 mg/dL — ABNORMAL HIGH (ref 70–110)

## 2015-04-12 LAB — CULTURE, BLOOD: Culture result:: NO GROWTH

## 2015-04-12 MED ORDER — INSULIN LISPRO 100 UNIT/ML INJECTION
100 unit/mL | Freq: Three times a day (TID) | SUBCUTANEOUS | Status: DC
Start: 2015-04-12 — End: 2015-04-12

## 2015-04-12 MED ORDER — OXYCODONE-ACETAMINOPHEN 7.5 MG-325 MG TAB
Freq: Four times a day (QID) | ORAL | Status: DC | PRN
Start: 2015-04-12 — End: 2015-04-13
  Administered 2015-04-12 – 2015-04-13 (×4): via ORAL

## 2015-04-12 MED ORDER — INSULIN GLARGINE 100 UNIT/ML INJECTION
100 unit/mL | Freq: Every day | SUBCUTANEOUS | Status: DC
Start: 2015-04-12 — End: 2015-04-12

## 2015-04-12 MED ORDER — INSULIN LISPRO 100 UNIT/ML INJECTION
100 unit/mL | Freq: Three times a day (TID) | SUBCUTANEOUS | Status: DC
Start: 2015-04-12 — End: 2015-04-13
  Administered 2015-04-12 – 2015-04-13 (×5): via SUBCUTANEOUS

## 2015-04-12 MED ORDER — INSULIN GLARGINE 100 UNIT/ML INJECTION
100 unit/mL | Freq: Every day | SUBCUTANEOUS | Status: DC
Start: 2015-04-12 — End: 2015-04-13
  Administered 2015-04-13: 13:00:00 via SUBCUTANEOUS

## 2015-04-12 MED FILL — INSULIN GLARGINE 100 UNIT/ML INJECTION: 100 unit/mL | SUBCUTANEOUS | Qty: 1

## 2015-04-12 MED FILL — OXYCODONE-ACETAMINOPHEN 7.5 MG-325 MG TAB: ORAL | Qty: 1

## 2015-04-12 MED FILL — PIPERACILLIN-TAZOBACTAM 3.375 GRAM IV SOLR: 3.375 gram | INTRAVENOUS | Qty: 3.38

## 2015-04-12 MED FILL — CYMBALTA 30 MG CAPSULE,DELAYED RELEASE: 30 mg | ORAL | Qty: 1

## 2015-04-12 MED FILL — PANTOPRAZOLE 40 MG TAB, DELAYED RELEASE: 40 mg | ORAL | Qty: 1

## 2015-04-12 MED FILL — MAPAP (ACETAMINOPHEN) 325 MG TABLET: 325 mg | ORAL | Qty: 2

## 2015-04-12 NOTE — Progress Notes (Signed)
Hospitalist Progress Note    Patient: Tracy [name] MRN: [id number]  CSN: [id number]    Date of Birth: 08/30/1956  Age: 59 y.o.  Sex: male    DOA: 04/06/2015 LOS:  LOS: 6 days          Chief Complaint: back pain          Assessment/Plan     Acute respiratory failure off vent.  Drug overdose/intoxication.  AKI - resolved.  DM uncontrolled.  Dehydration improved.  Depression major.  CAD  Chronic back pain followed by pain mgmt.    Transfer to floor.  Up titrate insulin.  Continue abx.  D/c candidate for 04/13/15.      Patient Active Problem List   Diagnosis Code   ??? Drug overdose T50.901A   ??? Uncontrolled type II diabetes mellitus (HCC) E11.65   ??? Unresponsive R41.89   ??? Acute respiratory failure (HCC) J96.00   ??? CAD (coronary artery disease) I25.10   ??? Hypertension I10   ??? Hyperkalemia E87.5   ??? AKI (acute kidney injury) (HCC) N17.9   ??? Hyperglycemia due to type 2 diabetes mellitus (HCC) E11.65   ??? Encephalopathy G93.40   ??? Cocaine abuse F14.10   ??? Major depressive disorder (HCC) F32.9       Subjective:    "I'm feeling a lot better."  No cp or sob.  No fever or chills.  Reports improvement in mood.  No SI or desire to harm others.  Optimistic.        Review of systems:    Constitutional: denies fevers, chills, myalgias  Respiratory: denies SOB, cough  Cardiovascular: denies chest pain, palpitations  Gastrointestinal: denies nausea, vomiting, diarrhea      Vital signs/Intake and Output:  Visit Vitals   Item Reading   ??? BP 129/69 mmHg   ??? Pulse 66   ??? Temp 98.1 ??F (36.7 ??C)   ??? Resp 17   ??? Ht 5\' 11"  (1.803 m)   ??? Wt 79.379 kg (175 lb)   ??? BMI 24.42 kg/m2   ??? SpO2 95%     Current Shift:  06/21 0701 - 06/21 1900  In: 340 [P.O.:240; I.V.:100]  Out: 1050 [Urine:1050]  Last three shifts:  06/19 1901 - 06/21 0700  In: 2498.2 [P.O.:1620; I.V.:878.2]  Out: 6660 [Urine:6660]    Exam:    General: Well developed, alert, NAD, OX3  Head/Neck: mmm  CVS:s1s2 rrr  Lungs:Ctab/l  Extremities: No edema.   Neuro:grossly normal , follows commands  Psych:appropriate                Labs: Results:       Chemistry Recent Labs      04/11/15   0420  04/10/15   0750   GLU  209*  115*   NA  141  140   K  3.6  3.3*   CL  106  109*   CO2  27  26   BUN  9  13   CREA  0.83  0.88   CA  7.8*  8.0*   AGAP  8  5   BUCR  11*  15 AP  26*   --    TP  5.3*   --    ALB  2.2*   --    GLOB  3.1   --    AGRAT  0.7*   --       CBC w/Diff Recent Labs      04/11/15  0420  04/10/15   0750   WBC  10.3  8.9   RBC  4.05*  4.24*   HGB  11.7*  12.5*   HCT  35.4*  37.8   PLT  213  205   GRANS  57   --    LYMPH  33   --    EOS  1   --       Cardiac Enzymes Recent Labs      04/09/15   1315   CPK  61   CKND1  CANNOT BE CALCULATED      Coagulation Recent Labs      04/11/15   0915  04/11/15   0215   04/10/15   0750   PTP   --    --    --   13.6   INR   --    --    --   1.1   APTT  87.7*  122.6*   < >   --     < > = values in this interval not displayed.       Lipid Panel No results found for: CHOL, CHOLPOCT, CHOLX, CHLST, CHOLV, [id number], HDL, LDL, NLDLCT, DLDL, LDLC, DLDLP, 161096, VLDLC, VLDL, TGL, TGLX, TRIGL, [id number], TRIGP, TGLPOCT, [id number], CHHD, CHHDX   BNP No results for input(s): BNPP in the last 72 hours.   Liver Enzymes Recent Labs      04/11/15   0420   TP  5.3*   ALB  2.2*   AP  26*   SGOT  20      Thyroid Studies No results found for: T4, T3U, TSH, TSHEXT     Procedures/imaging: see electronic medical records for all procedures/Xrays and details which were not copied into this note but were reviewed prior to creation of Plan      Delena Bali, MD

## 2015-04-12 NOTE — Other (Signed)
CRITICAL CARE INTERDISCIPLINARY ROUNDS    Patient Information:    Name:   Tracy [name]    Age:   59 y.o.    Admission Date:   04/06/2015    Critical Care Day: 7    Surgery Date:      Attending Provider:   Shirline Frees Ashrafuddin Ame*    Surgeon:        Consultant(s):   Young    Critical Care Physician:  Maple Hudson    Code Status: Full Code    Problem List:     Patient Active Problem List   Diagnosis Code   ??? Drug overdose T50.901A   ??? Uncontrolled type II diabetes mellitus (HCC) E11.65   ??? Unresponsive R41.89   ??? Acute respiratory failure (HCC) J96.00   ??? CAD (coronary artery disease) I25.10   ??? Hypertension I10   ??? Hyperkalemia E87.5   ??? AKI (acute kidney injury) (HCC) N17.9   ??? Hyperglycemia due to type 2 diabetes mellitus (HCC) E11.65   ??? Encephalopathy G93.40   ??? Cocaine abuse F14.10   ??? Major depressive disorder (HCC) F32.9       Principal Problem:  <principal problem not specified>    During rounds the following quality care indicators and evidence based practices were addressed :  DVT Prophylaxis;Isolation:;     Ventilator Bundle:      Sepsis Order Set:     Glycemic Control:   Recent Labs      04/11/15   0420  04/10/15   0750   GLU  209*  115*   ;  No results for input(s): PHI, PCO2I, PO2I in the last 72 hours.    Invalid input(s): 3 Adjustments     Acute MI/PCI:   Not applicable    Door to Balloon: Admission Time: 0749      Heart Failure:    Not applicable     SCIP Measures for other Surgeries:   Not applicable     Pneumonia:    Not applicable    Interdisciplinary team rounds were held with the following team members Care Management,  Nursing, Nutrition, Pharmacy, Physician, and Clinical Coordinator. Plan of care discussed.     Goals of Care/ Recommendations: possible discharge tomorrow, decrease mealtime to 8 unit, continue Lantus and corrective, superficial clot in basilic vein not a DVT - no anticoagulation PO,     See clinical pathway and/or care plan for interventions and desired outcomes.     Critical Care Discharge Status:  green

## 2015-04-12 NOTE — Other (Signed)
Bedside and Verbal shift change report given to Hasina RN (oncoming nurse) by Wesley J Andrews, RN   (offgoing nurse). Report included the following information SBAR, Kardex, Intake/Output, MAR, Recent Results, Med Rec Status and Cardiac Rhythm SR.

## 2015-04-12 NOTE — Progress Notes (Addendum)
0800-received pt sitting up in bed with eyes open, no sign of distress, vs stable, A+Ox4, calm and cooperative  0950-complaining of increased pain in legs and now back, medication given  1040-resting in bed with eyes closed, no sign of distress  1230-Resting in bed with eyes open, no sign of distress  1430-Resting in bed with eyes closed, no sign of distress, vs stable  1630-resting in bed, no sign of distress  1800-sitting up in bed, VS stable, no sign of distress, family at bedside

## 2015-04-12 NOTE — Progress Notes (Signed)
Chart reviewed. Pt discussed during ICU rounds. Continuing to follow and assess per protocol.    SuzAnne Schultz, RD  Pager: [phone]

## 2015-04-12 NOTE — Progress Notes (Signed)
Problem: Mobility Impaired (Adult and Pediatric)  Goal: *Acute Goals and Plan of Care (Insert Text)  Physical Therapy Goals  Initiated 04/10/2015 and to be accomplished within 2-8 day(s)  1. Patient will move from supine to sit and sit to supine in bed with supervision/set-up.   2. Patient will transfer from bed to chair and chair to bed with supervision/set-up using the least restrictive device.  3. Patient will perform sit to stand with supervision/set-up.  4. Patient will ambulate with supervision/set-up for 150 feet with the least restrictive device.   5. Patient will ascend/descend 2-5 stairs with use of handrail(s) with minimal assistance/contact guard assist as needed for home entry.   PHYSICAL THERAPY TREATMENT    Patient: Tracy [name] (59 y.o. male)  Date: 04/12/2015  Diagnosis: Unresponsive  Unresponsive <principal problem not specified>       Precautions: Fall   Chart, physical therapy assessment, plan of care and goals were reviewed.      ASSESSMENT:  Patient is increasing ambulation distance this session. However the patient reports feeling dizziness when ambulating. Patient ambulated about 50 feet with RW and CGA. Gait is slow and slightly unsteady, with the patient losing his balance on one occasion. Patient recovered his balance with minimal assistance and ambulated back to his room, seated upright in a chair. Will continue PT to maximize the patient's safe and independent mobility. Recommend rehab vs home health pending patient's progress with PT.  Progression toward goals:  [X]       Improving appropriately and progressing toward goals  [ ]       Improving slowly and progressing toward goals  [ ]       Not making progress toward goals and plan of care will be adjusted       PLAN:  Patient continues to benefit from skilled intervention to address the above impairments. Continue treatment per established plan of care.  Discharge Recommendations:  Rehab vs Home Health   Further Equipment Recommendations for Discharge:  N/A       SUBJECTIVE:   Patient stated ???I'm feeling good today.???      OBJECTIVE DATA SUMMARY:   Critical Behavior:  Neurologic State: Alert, Eyes open spontaneously  Orientation Level: Oriented X4  Cognition: Follows commands, Appropriate decision making, Appropriate for age attention/concentration, Appropriate safety awareness  Safety/Judgement: Awareness of environment, Good awareness of safety precautions, Insight into deficits  Functional Mobility Training:  Bed Mobility:  Rolling: Supervision  Supine to Sit: Supervision  Scooting: Supervision  Transfers:  Sit to Stand: Contact guard assistance  Stand to Sit: Contact guard assistance  Balance:  Sitting: Intact  Standing: Impaired;With support  Standing - Static: Good  Standing - Dynamic : Fair  Ambulation/Gait Training:  Distance (ft): 50 Feet (ft)  Assistive Device: Gait belt;Walker, rolling  Ambulation - Level of Assistance: Contact guard assistance;Minimal assistance  Gait Abnormalities: Decreased step clearance  Base of Support: Narrowed  Stance: Time  Speed/Cadence: Slow  Step Length: Left shortened;Right shortened  Swing Pattern: Right asymmetrical;Left asymmetrical    Pain:  Pain Scale 1: FLACC  Pain Intensity 1: 1  Pain Location 1: Back;Leg  Pain Description 1: Aching  Pain Intervention(s) 1: Medication (see MAR)  Activity Tolerance:   Fair/Good  Please refer to the flowsheet for vital signs taken during this treatment.  After treatment:   [X]  Patient left in no apparent distress sitting up in chair  [ ]  Patient left in no apparent distress in bed  [X]  Call bell left within reach  [  X] Nursing notified   Caregiver present   Bed alarm activated      Chales Salmon   Time Calculation: 15 mins

## 2015-04-12 NOTE — Progress Notes (Addendum)
@  1905 Pt taken over awake alert oriented x 4 watching TV, vital signs stable. Pt on room air spo2 98% . Assessment done and documented in appropriate flow sheets. Nursing management continues.  @1923  pt complaint of pain to lower back and leg ,verbalized that this is chronic. Repositioned no relief,analgesia administered as prescribed observation continues.  @2000  relative called to check in on pt, no new changes was communicated and pt informed.  @2200  pt on phone , watching TV , denies pain , vital signs WNL nursing observation continues.  @0000  no changes pt continues to deny pain vital signs stable at present. Nursing care continues.  @0200  pt asleep intermittently no new developments flacc 0 at present, nursing observation continues.  @0400  no changes care continues.  @0600  no new developments nursing observation continues.  @0756  Bedside and Verbal shift change report given to Amil Amen (oncoming nurse) by Aubery Lapping (offgoing nurse). Report included the following information SBAR, Kardex, Intake/Output, MAR, Recent Results and Med Rec Status.   Alarm parameters reviewed, on and audible Appropriate for patient clinical condition

## 2015-04-12 NOTE — Progress Notes (Signed)
[facility]  Pulmonary, Critical Care, and Sleep Medicine         Name: Tracy [name] MRN: [id number]   DOB: 1956/05/10 Hospital: [facility]   Date: 04/12/2015        Subjective:   6/21  Patient awake and alert today in no distress.   Doing well without complaints ambulatory awaiting bed assignment versus discharge tomorrow.  No DVT of lower or upper extremities.  Would NOT anticoagulate No DVT and np identifiable risk factors. Current guidelines recommend superficial treatment NSAIDS and warm compress no anticoagulation       This patient has been seen and evaluated at the request of Dr. Lucianne Muss for respiratory depression with drug overdose.  . Patient is a 59 y.o. male with multiple medical problems inculding dm, cad s/p stent x3, mi, htn,   Has past hx of overdose including amytryptlin.   Brought to ED unresponsive with respiratory rate of 4.  BS at presentation of 521.  SBP of 80s.         Pt was given narcan x2  And 2L of NS.   SBP improved.  Respiratory rate improved.   However pupils remain pinpoint.   Unresponsive noxious stimuli.  Started on narcan drip and insulin drip.      I was called by Dr Lucianne Muss about admission to ICU.  Debated about bipap. Per Dr. Lucianne Muss he was more awake, so agreed to try the bipap.  Seen patient at ER.  Mental status waxing and weaning,  Almost vomited infront of me.  bipap was stopped immeidately.  To protect the airway electively decided to intubate the patient.    Advised to start the ativan drip and stop the narcan drip.        Past Medical History   Diagnosis Date   ??? CAD (coronary artery disease)    ??? Diabetes (HCC)    ??? MI (myocardial infarction) (HCC)    ??? Hypertension    ??? High cholesterol    ??? Chronic pain    ??? Arthritis    ??? Major depressive disorder (HCC) 04/11/2015      Past Surgical History   Procedure Laterality Date   ??? Pr cardiac surg procedure unlist     ??? Hx coronary stent placement        Prior to Admission medications     Medication Sig Start Date End Date Taking? Authorizing Provider   pravastatin (PRAVACHOL) 80 mg tablet Take 1 Tab by mouth nightly. 10/29/14   Mohammed Corbin Ade, MD   amLODIPine (NORVASC) 10 mg tablet Take 1 Tab by mouth daily. 10/29/14   Mohammed Corbin Ade, MD   insulin glargine (LANTUS SOLOSTAR) 100 unit/mL (3 mL) pen 8 Units by SubCUTAneous route nightly. 10/29/14   Mohammed Corbin Ade, MD   oxyCODONE-acetaminophen (PERCOCET) 5-325 mg per tablet Take 1-2 Tabs by mouth every four (4) hours as needed for Pain. Max Daily Amount: 12 Tabs. 10/19/14   Frankey Poot, MD   pravastatin (PRAVACHOL) 80 mg tablet Take 80 mg by mouth nightly.    Historical Provider   docusate sodium (COLACE) 100 mg capsule Take 1 Cap by mouth two (2) times a day. 10/13/14   Junie Bame, PA   aspirin 81 mg chewable tablet Take 81 mg by mouth daily.    Phys Other, MD     No Known Allergies   History   Substance Use Topics   ??? Smoking status: Former Smoker   ???  Smokeless tobacco: Not on file   ??? Alcohol Use: Yes      Family History   Problem Relation Age of Onset   ??? Diabetes Mother    ??? Heart Disease Mother    ??? Diabetes Father         Current Facility-Administered Medications   Medication Dose Route Frequency   ??? [START ON 04/13/2015] insulin glargine (LANTUS) injection 26 Units  26 Units SubCUTAneous DAILY   ??? insulin lispro (HUMALOG) injection 8 Units  8 Units SubCUTAneous TIDAC   ??? insulin lispro (HUMALOG) injection   SubCUTAneous AC&HS   ??? DULoxetine (CYMBALTA) capsule 30 mg  30 mg Oral DAILY   ??? piperacillin-tazobactam (ZOSYN) 3.375 g in 0.9% sodium chloride (MBP/ADV) 100 mL MBP  3.375 g IntraVENous Q6H   ??? pantoprazole (PROTONIX) tablet 40 mg  40 mg Oral ACB   ??? sodium chloride (NS) flush 5-10 mL  5-10 mL IntraVENous Q8H       Review of Systems:  Limited due to current status    Objective:   Vital Signs:    BP 150/51 mmHg   Pulse 75   Temp(Src) 97.2 ??F (36.2 ??C)   Resp 23   Ht 5'  11" (1.803 m)   Wt 79.379 kg (175 lb)   BMI 24.42 kg/m2   SpO2 99%    O2 Device: Room air   O2 Flow Rate (L/min): 2 l/min   Temp (24hrs), Avg:97.9 ??F (36.6 ??C), Min:97.2 ??F (36.2 ??C), Max:98.4 ??F (36.9 ??C)       Intake/Output:   Last shift:      06/21 0701 - 06/21 1900  In: 580 [P.O.:480; I.V.:100]  Out: 1500 [Urine:1500]  Last 3 shifts: 06/19 1901 - 06/21 0700  In: 2498.2 [P.O.:1620; I.V.:878.2]  Out: 6660 [Urine:6660]    Intake/Output Summary (Last 24 hours) at 04/12/15 1302  Last data filed at 04/12/15 1216   Gross per 24 hour   Intake 1818.32 ml   Output   6125 ml   Net -4306.68 ml           Physical Exam:   General: comfortable interactive questions answered  HEENT;  Pupils are reactive  Neck: No adenopathy   CVS: S1S2 no murmurs  RS: clear throughout  Abd: soft, non tender, Neuro: non focal, awake, alert  Extrm: no leg edema   Skin: no rash       Data review:   Labs:  Recent Labs      04/11/15   0420  04/10/15   0750   WBC  10.3  8.9   HGB  11.7*  12.5*   HCT  35.4*  37.8   PLT  213  205     Recent Labs      04/11/15   0420  04/10/15   0750   NA  141  140   K  3.6  3.3*   CL  106  109*   CO2  27  26   GLU  209*  115*   BUN  9  13   CREA  0.83  0.88   CA  7.8*  8.0*   MG  1.8   --    PHOS  3.1   --    ALB  2.2*   --    SGOT  20   --    ALT  89*   --    INR   --   1.1  IMPRESSION:   ?? Acute respiratory failure on MV resolved, tolerated the extubation yesterday              Drug overdose with cocaine and (possibly tricyclic give prior hx )  ?? Poorly controlled DM with HgA1c of 12.5  ?? Bradycardia resolved NSR  ?? Intentional overdose of TCA and cocaine      RECOMMENDATIONS:   ?? Continue fluids tolerating diet  ?? Femoral line out, no DVT clot thus no anticoagulation, would not anticoagulate arm superficial clot  ??  sc insulin  ?? Echo done  ?? DVT, PUD prophylaxis  ?? Psych cleared transfer to floor 1-2 days additional abx then discharge as early as tomorrow  ?? Start cymbalta recommended by psych   ?? Discharge tomorrow transfer if bed available today   ??        Hester Mates, MD

## 2015-04-12 NOTE — Wound Image (Signed)
Pt seen by wound care.  Pt up in chair giving self bath.  No skin issues noted at this time.  Wound care will continue to follow daily while in ICU.

## 2015-04-12 NOTE — Other (Signed)
GLYCEMIC CONTROL AND NUTRITION PLAN OF CARE    Assessment/Recommendations:  - BG continues out of target range, eating well 80-100 % of meals offered  - recommend increase basal/bolus regimen ~ 20%   * Lantus 22 units every day   * Humalog 8 units qac   - continue corrective coverage at Normal Sensitivity Scale   (orders entered per Dr. Cathlean Cower)      Goal:  - BG will be in target range of 70-140 mg/dl (non-ICU) or 962-229 md/dl (ICU) within the next 24 hours    Most recent blood glucose values:   Lab Results   Component Value Date/Time    GLUCPOC 210* 04/12/2015 06:40 AM    GLUCPOC 121* 04/12/2015 06:36 AM    GLUCPOC 193* 04/11/2015 09:13 PM               Current A1C is equivalent to average blood glucose of 312 mg/dl over the past 2-3 months.    Lab Results   Component Value Date/Time    HEMOGLOBIN A1C 12.5 04/06/2015 07:55 AM       Current hospital diabetes medications: ??  - Lantus 20 units every day  - Humalog Normal Insulin Sensitivity Corrective Coverage    Previous day's insulin requirements: ??  - 44 units total (20 Lantus, 24 Humalog - 10 mealtime, 14 corrective)    Home diabetes medications:  - Lantus 8 units qhs       Anthropometrics:   IBW : 78.019 kg (172 lb), % IBW (Calculated): 101.74 %     Body mass index is 24.42 kg/(m^2).  Last 3 Recorded Weights in this Encounter    04/06/15 0755 04/08/15 1321   Weight: 79.379 kg (175 lb) 79.379 kg (175 lb)    Ht Readings from Last 1 Encounters:   04/08/15 5\' 11"  (1.803 m)       Diet:    Active Orders   Diet    DIET DIABETIC CONSISTENT CARB Regular       Intake:   Patient Vitals for the past 100 hrs:   % Diet Eaten   04/12/15 0841 90 %   04/11/15 2133 100 %   04/11/15 1754 80 %   04/11/15 1400 100 %   04/11/15 0740 100 %   04/10/15 1744 100 %   04/10/15 1250 90 %   04/10/15 1000 100 %   04/09/15 1847 90 %   04/09/15 1500 75 %   04/09/15 1212 100 %             Elwyn Reach, RD

## 2015-04-12 NOTE — Progress Notes (Addendum)
Attended IDR today- pt for transfer off ICU, may dc tomorrow.  For continuity of care, referrals placed to local SNFs for short rehab stay- [facility], [facility], [facility], the [facility], [facility].  Pt plans return home per yesterday's conversation, CM will follow up later today as pt was having bath at time of visit.  Pt will likely need RW for home use if he decides to go home.    1330- met with pt at bedside. Pt agreeable to referrals above, also requested referrals to the [facility] and [facility].  Left list of home health agencies with pt to review.

## 2015-04-12 NOTE — Progress Notes (Signed)
Cardiology Progress Note      04/12/2015 11:20 AM     Admit Date: 04/06/2015    Admit Diagnosis: Unresponsive  Unresponsive      Subjective:     Tracy [name] denies chest pain, chest pressure/discomfort, dyspnea, palpitations.    BP 144/67 mmHg   Pulse 69   Temp(Src) 98.1 ??F (36.7 ??C)   Resp 17   Ht 5\' 11"  (1.803 m)   Wt 79.379 kg (175 lb)   BMI 24.42 kg/m2   SpO2 100%  Current Facility-Administered Medications   Medication Dose Route Frequency   ??? oxyCODONE-acetaminophen (PERCOCET 7.5) 7.5-325 mg per tablet 1 Tab  1 Tab Oral Q6H PRN   ??? [START ON 04/13/2015] insulin glargine (LANTUS) injection 26 Units  26 Units SubCUTAneous DAILY   ??? insulin lispro (HUMALOG) injection 8 Units  8 Units SubCUTAneous TIDAC   ??? insulin lispro (HUMALOG) injection   SubCUTAneous AC&HS   ??? DULoxetine (CYMBALTA) capsule 30 mg  30 mg Oral DAILY   ??? acetaminophen (TYLENOL) tablet 650 mg  650 mg Oral Q6H PRN   ??? piperacillin-tazobactam (ZOSYN) 3.375 g in 0.9% sodium chloride (MBP/ADV) 100 mL MBP  3.375 g IntraVENous Q6H   ??? pantoprazole (PROTONIX) tablet 40 mg  40 mg Oral ACB   ??? nitroglycerin (NITROSTAT) tablet 0.4 mg  0.4 mg SubLINGual Q5MIN PRN   ??? glucose chewable tablet 16 g  4 Tab Oral PRN   ??? glucagon (GLUCAGEN) injection 1 mg  1 mg IntraMUSCular PRN   ??? dextrose (D50W) injection syrg 12.5-25 g  25-50 mL IntraVENous PRN   ??? albuterol-ipratropium (DUO-NEB) 2.5 MG-0.5 MG/3 ML  3 mL Nebulization Q4H PRN   ??? sodium chloride (NS) flush 5-10 mL  5-10 mL IntraVENous Q8H   ??? sodium chloride (NS) flush 5-10 mL  5-10 mL IntraVENous PRN         Objective:      Physical Exam:  BP 144/67 mmHg   Pulse 69   Temp(Src) 98.1 ??F (36.7 ??C)   Resp 17   Ht 5\' 11"  (1.803 m)   Wt 79.379 kg (175 lb)   BMI 24.42 kg/m2   SpO2 100%  General Appearance:  Well developed, well nourished,alert and oriented x 3, and individual in no acute distress.   Ears/Nose/Mouth/Throat:   Hearing grossly normal.         Neck: Supple.    Chest:   Lungs clear to auscultation bilaterally.   Cardiovascular:  Regular rate and rhythm, S1, S2 normal, no murmur.   Abdomen:   Soft, non-tender, bowel sounds are active.   Extremities: No edema bilaterally.    Skin: Warm and dry.               Data Review:   Labs:    Recent Results (from the past 24 hour(s))   GLUCOSE, POC    Collection Time: 04/11/15 11:53 AM   Result Value Ref Range    Glucose (POC) 198 (H) 70 - 110 mg/dL   GLUCOSE, POC    Collection Time: 04/11/15  4:44 PM   Result Value Ref Range    Glucose (POC) 207 (H) 70 - 110 mg/dL   GLUCOSE, POC    Collection Time: 04/11/15  9:13 PM   Result Value Ref Range    Glucose (POC) 193 (H) 70 - 110 mg/dL   GLUCOSE, POC    Collection Time: 04/12/15  6:36 AM   Result Value Ref Range    Glucose (POC)  121 (H) 70 - 110 mg/dL   GLUCOSE, POC    Collection Time: 04/12/15  6:40 AM   Result Value Ref Range    Glucose (POC) 210 (H) 70 - 110 mg/dL       Telemetry: normal sinus rhythm      Assessment:     Active Problems:    Drug overdose (08/28/2013)      Uncontrolled type II diabetes mellitus (HCC) (10/19/2014)      Unresponsive (04/06/2015)      Acute respiratory failure (HCC) (04/06/2015)      CAD (coronary artery disease) ()      Hypertension ()      Hyperkalemia (04/06/2015)      AKI (acute kidney injury) (HCC) (04/06/2015)      Hyperglycemia due to type 2 diabetes mellitus (HCC) (04/06/2015)      Encephalopathy (04/06/2015)      Cocaine abuse (04/06/2015)      Major depressive disorder (HCC) (04/11/2015)        Plan:     Patient remains hemodynamic is stable. His multiple other issues. There???s been no further bradycardia. Follow.    Marisue Ivan, MD

## 2015-04-13 LAB — GLUCOSE, POC
Glucose (POC): 167 mg/dL — ABNORMAL HIGH (ref 70–110)
Glucose (POC): 160 mg/dL — ABNORMAL HIGH (ref 70–110)
Glucose (POC): 120 mg/dL — ABNORMAL HIGH (ref 70–110)
Glucose (POC): 121 mg/dL — ABNORMAL HIGH (ref 70–110)
Glucose (POC): 118 mg/dL — ABNORMAL HIGH (ref 70–110)
Glucose (POC): 181 mg/dL — ABNORMAL HIGH (ref 70–110)

## 2015-04-13 LAB — CBC W/O DIFF
HCT: 36.7 % (ref 36.0–48.0)
HGB: 12.1 g/dL — ABNORMAL LOW (ref 13.0–16.0)
MCH: 28.7 PG (ref 24.0–34.0)
MCHC: 33 g/dL (ref 31.0–37.0)
MCV: 87.2 FL (ref 74.0–97.0)
MPV: 10.1 FL (ref 9.2–11.8)
PLATELET: 247 10*3/uL (ref 135–420)
RBC: 4.21 M/uL — ABNORMAL LOW (ref 4.70–5.50)
RDW: 13.1 % (ref 11.6–14.5)
WBC: 11.3 10*3/uL (ref 4.6–13.2)

## 2015-04-13 LAB — METABOLIC PANEL, BASIC
Anion gap: 6 mmol/L (ref 3.0–18)
BUN/Creatinine ratio: 9 — ABNORMAL LOW (ref 12–20)
BUN: 7 MG/DL (ref 7.0–18)
CO2: 28 mmol/L (ref 21–32)
Calcium: 8.4 MG/DL — ABNORMAL LOW (ref 8.5–10.1)
Chloride: 102 mmol/L (ref 100–108)
Creatinine: 0.74 MG/DL (ref 0.6–1.3)
GFR est AA: >60 mL/min/{1.73_m2} (ref >60)
GFR est non-AA: >60 mL/min/{1.73_m2} (ref >60)
Glucose: 167 mg/dL — ABNORMAL HIGH (ref 74–99)
Potassium: 3.7 mmol/L (ref 3.5–5.5)
Sodium: 136 mmol/L (ref 136–145)

## 2015-04-13 LAB — CULTURE, BLOOD

## 2015-04-13 LAB — MAGNESIUM: Magnesium: 1.9 mg/dL (ref 1.8–2.4)

## 2015-04-13 MED ORDER — INSULIN LISPRO 100 UNIT/ML (3 ML) SUB-Q PEN
100 unit/mL | PACK | SUBCUTANEOUS | Status: AC
Start: 2015-04-13 — End: ?

## 2015-04-13 MED ORDER — DULOXETINE 30 MG CAP, DELAYED RELEASE
30 mg | ORAL_CAPSULE | Freq: Every day | ORAL | Status: AC
Start: 2015-04-13 — End: ?

## 2015-04-13 MED ORDER — INSULIN GLARGINE 100 UNIT/ML (3 ML) SUB-Q PEN
100 unit/mL (3 mL) | Freq: Every evening | SUBCUTANEOUS | Status: DC
Start: 2015-04-13 — End: 2015-04-13

## 2015-04-13 MED ORDER — NITROGLYCERIN 0.4 MG SUBLINGUAL TAB
0.4 mg | SUBLINGUAL | Status: AC | PRN
Start: 2015-04-13 — End: ?

## 2015-04-13 MED ORDER — ASPIRIN 81 MG CHEWABLE TAB
81 mg | ORAL_TABLET | Freq: Every day | ORAL | Status: AC
Start: 2015-04-13 — End: ?

## 2015-04-13 MED ORDER — AMLODIPINE 10 MG TAB
10 mg | ORAL_TABLET | Freq: Every day | ORAL | Status: AC
Start: 2015-04-13 — End: ?

## 2015-04-13 MED ORDER — INSULIN GLARGINE 100 UNIT/ML (3 ML) SUB-Q PEN
100 unit/mL (3 mL) | Freq: Every evening | SUBCUTANEOUS | Status: AC
Start: 2015-04-13 — End: ?

## 2015-04-13 MED ORDER — PRAVASTATIN 80 MG TAB
80 mg | ORAL_TABLET | Freq: Every evening | ORAL | Status: AC
Start: 2015-04-13 — End: ?

## 2015-04-13 MED FILL — PANTOPRAZOLE 40 MG TAB, DELAYED RELEASE: 40 mg | ORAL | Qty: 1

## 2015-04-13 MED FILL — OXYCODONE-ACETAMINOPHEN 7.5 MG-325 MG TAB: ORAL | Qty: 1

## 2015-04-13 MED FILL — CYMBALTA 30 MG CAPSULE,DELAYED RELEASE: 30 mg | ORAL | Qty: 1

## 2015-04-13 MED FILL — INSULIN GLARGINE 100 UNIT/ML INJECTION: 100 unit/mL | SUBCUTANEOUS | Qty: 1

## 2015-04-13 MED FILL — SODIUM CHLORIDE 0.9 % IV PIGGY BACK: INTRAVENOUS | Qty: 100

## 2015-04-13 MED FILL — PIPERACILLIN-TAZOBACTAM 3.375 GRAM IV SOLR: 3.375 gram | INTRAVENOUS | Qty: 3.38

## 2015-04-13 NOTE — Progress Notes (Addendum)
Page received, pt needs RW for home.  Once order signed, CM can deliver RW to pt room.  Medicare will not cover walker unless order signed.    1700- MD has been paged, CM delivered walker, will follow up with MD tomorrow if needed.  Also follow up appt has been scheduled and pt made aware; [facility] phone number given for pt to make appt.

## 2015-04-13 NOTE — Progress Notes (Signed)
Chart reviewed. Pt discussed during ICU rounds. No changes to nutrition care plan at this time. Continuing to follow and assess per protocol.    SuzAnne Schultz, RD  Pager: [phone]

## 2015-04-13 NOTE — Other (Addendum)
@  0800: plan of day reviewed, understood well.@0845 : assisted to go BR, unsteady gait, lean toward to this Clinical research associate, offered walker for return.  @1000 : P/T ambulated in hall way with walker without walker, recommended have him walker for safety then D/C home.  @1200 ; BS; checked, meal dose insulin given.  Pt stated had PNA vaccine last year.  @1545 : requested pain med for chronic lower back pain,medicated percocet 1 tab.  will assess the pain in 1 hr.  @1600 : D/C instruction, stated where is pain medication Rx. I'm out of that, paged Dr. Cathlean Cower.  @1620 : all D/C instruction: 2 RN: KB and this Clinical research associate, Med understood well, stated will follow up with psychiatrist.  Case manager called pt need walker as recommended by PT.  @1720 : pt's fiance at bedside, reviewed meds, D/C instructions with her, pt has all belongings: 1 pair of glasses, cell phone with charger.  @1730 : D/C'd home via W/C with walker to take home.  Condition: stable.

## 2015-04-13 NOTE — Progress Notes (Signed)
Cardiology Progress Note      04/13/2015 12:49 PM    Admit Date: 04/06/2015    Admit Diagnosis: Unresponsive  Unresponsive      Subjective:     Tracy [name] denies chest pain, chest pressure/discomfort, dyspnea, palpitations.    BP 136/78 mmHg   Pulse 65   Temp(Src) 97.5 ??F (36.4 ??C)   Resp 13   Ht  (1.803 m)   Wt 79.379 kg (175 lb)   BMI 24.42 kg/m2   SpO2 96%  Current Facility-Administered Medications   Medication Dose Route Frequency   ??? oxyCODONE-acetaminophen (PERCOCET 7.5) 7.5-325 mg per tablet 1 Tab  1 Tab Oral Q6H PRN   ??? insulin glargine (LANTUS) injection 26 Units  26 Units SubCUTAneous DAILY   ??? insulin lispro (HUMALOG) injection 8 Units  8 Units SubCUTAneous TIDAC   ??? insulin lispro (HUMALOG) injection   SubCUTAneous AC&HS   ??? DULoxetine (CYMBALTA) capsule 30 mg  30 mg Oral DAILY   ??? acetaminophen (TYLENOL) tablet 650 mg  650 mg Oral Q6H PRN   ??? piperacillin-tazobactam (ZOSYN) 3.375 g in 0.9% sodium chloride (MBP/ADV) 100 mL MBP  3.375 g IntraVENous Q6H   ??? pantoprazole (PROTONIX) tablet 40 mg  40 mg Oral ACB   ??? nitroglycerin (NITROSTAT) tablet 0.4 mg  0.4 mg SubLINGual Q5MIN PRN   ??? glucose chewable tablet 16 g  4 Tab Oral PRN   ??? glucagon (GLUCAGEN) injection 1 mg  1 mg IntraMUSCular PRN   ??? dextrose (D50W) injection syrg 12.5-25 g  25-50 mL IntraVENous PRN   ??? albuterol-ipratropium (DUO-NEB) 2.5 MG-0.5 MG/3 ML  3 mL Nebulization Q4H PRN   ??? sodium chloride (NS) flush 5-10 mL  5-10 mL IntraVENous Q8H   ??? sodium chloride (NS) flush 5-10 mL  5-10 mL IntraVENous PRN         Objective:      Physical Exam:  BP 136/78 mmHg   Pulse 65   Temp(Src) 97.5 ??F (36.4 ??C)   Resp 13   Ht  (1.803 m)   Wt 79.379 kg (175 lb)   BMI 24.42 kg/m2   SpO2 96%  General Appearance:  Well developed, well nourished,alert and oriented x 3, and individual in no acute distress.   Ears/Nose/Mouth/Throat:   Hearing grossly normal.         Neck: Supple.   Chest:   Lungs clear to auscultation bilaterally.    Cardiovascular:  Regular rate and rhythm, S1, S2 normal, no murmur.   Abdomen:   Soft, non-tender, bowel sounds are active.   Extremities: No edema bilaterally.    Skin: Warm and dry.               Data Review:   Labs:    Recent Results (from the past 24 hour(s))   GLUCOSE, POC    Collection Time: 04/12/15  4:48 PM   Result Value Ref Range    Glucose (POC) 193 (H) 70 - 110 mg/dL   GLUCOSE, POC    Collection Time: 04/12/15  9:09 PM   Result Value Ref Range    Glucose (POC) 167 (H) 70 - 110 mg/dL   CBC W/O DIFF    Collection Time: 04/13/15  6:23 AM   Result Value Ref Range    WBC 11.3 4.6 - 13.2 K/uL    RBC 4.21 (L) 4.70 - 5.50 M/uL    HGB 12.1 (L) 13.0 - 16.0 g/dL    HCT 95.6 21.3 - 08.6 %  MCV 87.2 74.0 - 97.0 FL    MCH 28.7 24.0 - 34.0 PG    MCHC 33.0 31.0 - 37.0 g/dL    RDW 03.5 46.5 - 68.1 %    PLATELET 247 135 - 420 K/uL    MPV 10.1 9.2 - 11.8 FL   METABOLIC PANEL, BASIC    Collection Time: 04/13/15  6:23 AM   Result Value Ref Range    Sodium 136 136 - 145 mmol/L    Potassium 3.7 3.5 - 5.5 mmol/L    Chloride 102 100 - 108 mmol/L    CO2 28 21 - 32 mmol/L    Anion gap 6 3.0 - 18 mmol/L    Glucose 167 (H) 74 - 99 mg/dL    BUN 7 7.0 - 18 MG/DL    Creatinine 2.75 0.6 - 1.3 MG/DL    BUN/Creatinine ratio 9 (L) 12 - 20      GFR est AA >60 >60 ml/min/1.59m2    GFR est non-AA >60 >60 ml/min/1.33m2    Calcium 8.4 (L) 8.5 - 10.1 MG/DL   MAGNESIUM    Collection Time: 04/13/15  6:23 AM   Result Value Ref Range    Magnesium 1.9 1.8 - 2.4 mg/dL   GLUCOSE, POC    Collection Time: 04/13/15  6:46 AM   Result Value Ref Range    Glucose (POC) 160 (H) 70 - 110 mg/dL   GLUCOSE, POC    Collection Time: 04/13/15  9:00 AM   Result Value Ref Range    Glucose (POC) 120 (H) 70 - 110 mg/dL   GLUCOSE, POC    Collection Time: 04/13/15 11:49 AM   Result Value Ref Range    Glucose (POC) 118 (H) 70 - 110 mg/dL       Telemetry: normal sinus rhythm      Assessment:     Active Problems:    Drug overdose (08/28/2013)       Uncontrolled type II diabetes mellitus (HCC) (10/19/2014)      Unresponsive (04/06/2015)      Acute respiratory failure (HCC) (04/06/2015)      CAD (coronary artery disease) ()      Hypertension ()      Hyperkalemia (04/06/2015)      AKI (acute kidney injury) (HCC) (04/06/2015)      Hyperglycemia due to type 2 diabetes mellitus (HCC) (04/06/2015)      Encephalopathy (04/06/2015)      Cocaine abuse (04/06/2015)      Major depressive disorder (HCC) (04/11/2015)        Plan:     The patient is being discharged today. He said no more episodes of bradycardia since recovering from his drug overdose. At this time were still not sure what he overdosed on. Will be available PRN.    Marisue Ivan, MD

## 2015-04-13 NOTE — Progress Notes (Signed)
Problem: Self Care Deficits Care Plan (Adult)  Goal: *Acute Goals and Plan of Care (Insert Text)  Occupational Therapy Goals  Initiated 04/11/2015 within 7 day(s).    1. Patient will perform grooming with modified independence standing sinkside.  2. Patient will perform upper body dressing with modified independence.  3. Patient will perform lower body dressing with modified independence.  4. Patient will perform toilet transfers with supervision/set-up.  5. Patient will perform all aspects of toileting with supervision/set-up.  6. Patient will participate in upper extremity therapeutic exercise/activities with modified independence for 10 minutes.   7. Patient will utilize energy conservation techniques during functional activities without cues.  OCCUPATIONAL THERAPY TREATMENT    Patient: Tracy [name] (59 y.o. male)  Date: 04/13/2015  Diagnosis: Unresponsive  Unresponsive <principal problem not specified>  Precautions: Fall  Chart, occupational therapy assessment, plan of care, and goals were reviewed.      ASSESSMENT:  Patient supine in bed upon start of session with no reports of pain. Patient seen with PT. Patient able to independently perform all bed mobility in order to sit EOB. Patient required CGA without an AD in the hallway with mild LOB. When using a RW, patient required supervision during ambulation. Patient returned to sitting up in chair with all other needs within reach at this time.  Progression toward goals:  [X]           Improving appropriately and progressing toward goals  [ ]           Improving slowly and progressing toward goals  [ ]           Not making progress toward goals and plan of care will be adjusted       PLAN:  Patient continues to benefit from skilled intervention to address the above impairments. Continue treatment per established plan of care.  Discharge Recommendations:  Home Health  Further Equipment Recommendations for Discharge:  rolling walker       SUBJECTIVE:    Patient stated ???I'm supposed to be going home today.???      OBJECTIVE DATA SUMMARY:   Cognitive/Behavioral Status:  Neurologic State: Alert  Orientation Level: Oriented X4  Cognition: Follows commands  Safety/Judgement: Awareness of environment, Fall prevention  Functional Mobility and Transfers for ADLs:              Bed Mobility:  Rolling: Independent  Supine to Sit: Independent  Scooting: Independent              Transfers:  Sit to Stand: Independent  Bed to Chair: Supervision  Balance:  Sitting: Intact  Standing: Impaired;Without support  Standing - Static: Good  Standing - Dynamic : Fair  ADL Intervention:  Cognitive Retraining  Safety/Judgement: Awareness of environment;Fall prevention  Pain:  Pain Scale 1: Numeric (0 - 10)  Pain Intensity 1: 0  Pain Location 1: Back  Pain Orientation 1: Lower  Pain Description 1: Aching  Pain Intervention(s) 1: Medication (see MAR)  Activity Tolerance:    Patient tolerated session with no reports of increased pain or fatigue this date.  Please refer to the flowsheet for vital signs taken during this treatment.  After treatment:   [X]   Patient left in no apparent distress sitting up in chair  [ ]   Patient left in no apparent distress in bed  [X]   Call bell left within reach  [X]   Nursing notified  [ ]   Caregiver present  [ ]   Bed alarm activated    Arna Snipe.  Rowe OTR/L  Time Calculation: 11 mins

## 2015-04-13 NOTE — Progress Notes (Signed)
[facility]  Pulmonary, Critical Care, and Sleep Medicine         Name: Tracy [name] MRN: [id number]   DOB: January 10, 1956 Hospital: [facility]   Date: 04/13/2015        Subjective:   6/21  Patient awake and alert ambulating with out problem in ICU. Discharge home this evening no acute issues.  Cymbalta started and needs appropriate psych follow up otherwise no current medical issues       This patient has been seen and evaluated at the request of Dr. Lucianne Muss for respiratory depression with drug overdose.  . Patient is a 59 y.o. male with multiple medical problems inculding dm, cad s/p stent x3, mi, htn,   Has past hx of overdose including amytryptlin.   Brought to ED unresponsive with respiratory rate of 4.  BS at presentation of 521.  SBP of 80s.         Pt was given narcan x2  And 2L of NS.   SBP improved.  Respiratory rate improved.   However pupils remain pinpoint.   Unresponsive noxious stimuli.  Started on narcan drip and insulin drip.      I was called by Dr Lucianne Muss about admission to ICU.  Debated about bipap. Per Dr. Lucianne Muss he was more awake, so agreed to try the bipap.  Seen patient at ER.  Mental status waxing and weaning,  Almost vomited infront of me.  bipap was stopped immeidately.  To protect the airway electively decided to intubate the patient.    Advised to start the ativan drip and stop the narcan drip.        Past Medical History   Diagnosis Date   ??? CAD (coronary artery disease)    ??? Diabetes (HCC)    ??? MI (myocardial infarction) (HCC)    ??? Hypertension    ??? High cholesterol    ??? Chronic pain    ??? Arthritis    ??? Major depressive disorder (HCC) 04/11/2015      Past Surgical History   Procedure Laterality Date   ??? Pr cardiac surg procedure unlist     ??? Hx coronary stent placement        Prior to Admission medications    Medication Sig Start Date End Date Taking? Authorizing Provider   DULoxetine (CYMBALTA) 30 mg capsule Take 1 Cap by mouth daily. 04/13/15   Yes Delena Bali, MD   amLODIPine (NORVASC) 10 mg tablet Take 1 Tab by mouth daily. 04/13/15  Yes Delena Bali, MD   aspirin 81 mg chewable tablet Take 1 Tab by mouth daily. 04/13/15  Yes Delena Bali, MD   insulin glargine (LANTUS SOLOSTAR) 100 unit/mL (3 mL) pen 20 Units by SubCUTAneous route nightly. 04/13/15  Yes Delena Bali, MD   pravastatin (PRAVACHOL) 80 mg tablet Take 1 Tab by mouth nightly. 04/13/15  Yes Delena Bali, MD   nitroglycerin (NITROSTAT) 0.4 mg SL tablet 1 Tab by SubLINGual route every five (5) minutes as needed for Chest Pain. 04/13/15  Yes Delena Bali, MD   insulin lispro (HUMALOG KWIKPEN) 100 unit/mL kwikpen 5 units sc before meals. 04/13/15  Yes Delena Bali, MD   pravastatin (PRAVACHOL) 80 mg tablet Take 1 Tab by mouth nightly. 10/29/14   Mohammed Corbin Ade, MD   oxyCODONE-acetaminophen (PERCOCET) 5-325 mg per tablet Take 1-2 Tabs by mouth every four (4) hours as needed for Pain. Max Daily Amount: 12 Tabs. 10/19/14  Frankey Poot, MD   docusate sodium (COLACE) 100 mg capsule Take 1 Cap by mouth two (2) times a day. 10/13/14   Junie Bame, PA     No Known Allergies   History   Substance Use Topics   ??? Smoking status: Former Smoker   ??? Smokeless tobacco: Not on file   ??? Alcohol Use: Yes      Family History   Problem Relation Age of Onset   ??? Diabetes Mother    ??? Heart Disease Mother    ??? Diabetes Father         Current Facility-Administered Medications   Medication Dose Route Frequency   ??? insulin glargine (LANTUS) injection 26 Units  26 Units SubCUTAneous DAILY   ??? insulin lispro (HUMALOG) injection 8 Units  8 Units SubCUTAneous TIDAC   ??? insulin lispro (HUMALOG) injection   SubCUTAneous AC&HS   ??? DULoxetine (CYMBALTA) capsule 30 mg  30 mg Oral DAILY   ??? piperacillin-tazobactam (ZOSYN) 3.375 g in 0.9% sodium chloride (MBP/ADV) 100 mL MBP  3.375 g IntraVENous Q6H   ??? pantoprazole (PROTONIX) tablet 40 mg  40 mg Oral ACB    ??? sodium chloride (NS) flush 5-10 mL  5-10 mL IntraVENous Q8H       Review of Systems:  Limited due to current status    Objective:   Vital Signs:    BP 136/78 mmHg   Pulse 65   Temp(Src) 97.5 ??F (36.4 ??C)   Resp 13   Ht  (1.803 m)   Wt 79.379 kg (175 lb)   BMI 24.42 kg/m2   SpO2 96%    O2 Device: Room air   O2 Flow Rate (L/min): 2 l/min   Temp (24hrs), Avg:98.1 ??F (36.7 ??C), Min:97.5 ??F (36.4 ??C), Max:98.6 ??F (37 ??C)       Intake/Output:   Last shift:         Last 3 shifts: 06/20 1901 - 06/22 0700  In: 1120 [P.O.:720; I.V.:400]  Out: 8150 [Urine:8150]    Intake/Output Summary (Last 24 hours) at 04/13/15 1222  Last data filed at 04/13/15 0619   Gross per 24 hour   Intake    100 ml   Output   2400 ml   Net  -2300 ml           Physical Exam:   General: comfortable interactive questions answered  HEENT;  Pupils are reactive  Neck: No adenopathy   CVS: S1S2 no murmurs  RS: clear throughout  Abd: soft, non tender, Neuro: non focal, awake, alert  Extrm: no leg edema   Skin: no rash       Data review:   Labs:  Recent Labs      04/13/15   0623  04/11/15   0420   WBC  11.3  10.3   HGB  12.1*  11.7*   HCT  36.7  35.4*   PLT  247  213     Recent Labs      04/13/15   0623  04/11/15   0420   NA  136  141   K  3.7  3.6   CL  102  106   CO2  28  27   GLU  167*  209*   BUN  7  9   CREA  0.74  0.83   CA  8.4*  7.8*   MG  1.9  1.8   PHOS   --   3.1  ALB   --   2.2*   SGOT   --   20   ALT   --   89*         IMPRESSION:   ?? Acute respiratory failure on MV resolved, tolerated the extubation yesterday              Drug overdose with cocaine and (possibly tricyclic give prior hx )  ?? Poorly controlled DM with HgA1c of 12.5  ?? Bradycardia resolved NSR  ?? Intentional overdose of TCA and cocaine      RECOMMENDATIONS:   ?? Femoral line out, no DVT clot thus no anticoagulation, would not anticoagulate arm superficial clot  ??  sc insulin  ?? Echo done  ?? DVT, PUD prophylaxis   ?? Psych cleared and discharge to home with no beds being available for days  ?? Start cymbalta recommended by psych  ?? Discharge today agree with hospitalist   ??        Hester Mates, MD

## 2015-04-13 NOTE — Other (Signed)
GLYCEMIC CONTROL AND NUTRITION PLAN OF CARE    Assessment/Recommendations:  - Discussed in rounds  - BG within ICU target range this AM, continues eating well 80-100 % of meals offered  - 1st dose of increased basal dose was this morning, recommend continue current regimen  ??????????????????????* if BG continues to trend down, <100, recommend decrease basal dose to Lantus 22 units every day and continue current mealtime dose (Humalog 8 units qac)    Goal:  - BG will remain in target range of 70-140 mg/dl (non-ICU) or 416-384 md/dl (ICU) for duration of admission    Most recent blood glucose values:   Lab Results   Component Value Date/Time    GLU 167* 04/13/2015 06:23 AM    GLUCPOC 118* 04/13/2015 11:49 AM    GLUCPOC 120* 04/13/2015 09:00 AM    GLUCPOC 160* 04/13/2015 06:46 AM             Current A1C is equivalent to average blood glucose of 312 mg/dl over the past 2-3 months.    Lab Results   Component Value Date/Time    HEMOGLOBIN A1C 12.5 04/06/2015 07:55 AM       Current hospital diabetes medications: ??  - Lantus 26 units every day (1st dose this AM)  - Humalog 8 units qac  - Humalog Normal Insulin Sensitivity Corrective Coverage    Previous day's insulin requirements: ??  - 49 units total (20 Lantus, 29 Humalog - 21 mealtime, 8 corrective)    Home diabetes medications:  - Lantus 8 units qhs       Anthropometrics:   IBW : 78.019 kg (172 lb), % IBW (Calculated): 101.74 %     Body mass index is 24.42 kg/(m^2).  Last 3 Recorded Weights in this Encounter    04/06/15 0755 04/08/15 1321   Weight: 79.379 kg (175 lb) 79.379 kg (175 lb)    Ht Readings from Last 1 Encounters:   04/08/15 5\' 11"  (1.803 m)       Diet:    Active Orders   Diet    DIET DIABETIC CONSISTENT CARB Regular       Intake:   Patient Vitals for the past 100 hrs:   % Diet Eaten   04/12/15 1216 100 %   04/12/15 0841 90 %   04/11/15 2133 100 %   04/11/15 1754 80 %   04/11/15 1400 100 %   04/11/15 0740 100 %   04/10/15 1744 100 %   04/10/15 1250 90 %    04/10/15 1000 100 %   04/09/15 1847 90 %   04/09/15 1500 75 %   04/09/15 1212 100 %             Elwyn Reach, RD

## 2015-04-13 NOTE — Discharge Summary (Signed)
Discharge Summary    Patient: Tracy [name] MRN: [id number]  CSN: [id number]    Date of Birth: [birth year]  Age: 59 y.o.  Sex: male    DOA: 04/06/2015 LOS:  LOS: 7 days   Discharge Date:      Primary Care Provider:  Barton Dubois, MD    Admission Diagnoses: Unresponsive  Unresponsive    Discharge Diagnoses:    Problem List as of 04/13/2015  Date Reviewed: 11/26/2014          Codes Class Noted - Resolved    Major depressive disorder ([place]) ICD-10-CM: F32.9  ICD-9-CM: 296.20  04/11/2015 - Present        Unresponsive ICD-10-CM: R41.89  ICD-9-CM: 780.09  04/06/2015 - Present        Acute respiratory failure ([place]) ICD-10-CM: J96.00  ICD-9-CM: 518.81  04/06/2015 - Present        CAD (coronary artery disease) ICD-10-CM: I25.10  ICD-9-CM: 414.00  Unknown - Present        Hypertension ICD-10-CM: I10  ICD-9-CM: 401.9  Unknown - Present        Hyperkalemia ICD-10-CM: E87.5  ICD-9-CM: 276.7  04/06/2015 - Present        AKI (acute kidney injury) ([place]) ICD-10-CM: N17.9  ICD-9-CM: 584.9  04/06/2015 - Present        Hyperglycemia due to type 2 diabetes mellitus ([place]) ICD-10-CM: E11.65  ICD-9-CM: 250.00  04/06/2015 - Present        Encephalopathy ICD-10-CM: G93.40  ICD-9-CM: 348.30  04/06/2015 - Present        Cocaine abuse ICD-10-CM: F14.10  ICD-9-CM: 305.60  04/06/2015 - Present        Uncontrolled type II diabetes mellitus ([place]) ICD-10-CM: E11.65  ICD-9-CM: 250.02  10/19/2014 - Present        Drug overdose ICD-10-CM: T50.901A  ICD-9-CM: 977.9, E980.5  08/28/2013 - Present              Discharge Medications:     Current Discharge Medication List      START taking these medications    Details   DULoxetine (CYMBALTA) 30 mg capsule Take 1 Cap by mouth daily.  Qty: 30 Cap, Refills: 0         CONTINUE these medications which have CHANGED    Details   insulin glargine (LANTUS SOLOSTAR) 100 unit/mL (3 mL) pen 20 Units by SubCUTAneous route nightly.  Qty: 1 Each, Refills: 2         CONTINUE these medications which have NOT CHANGED    Details    amLODIPine (NORVASC) 10 mg tablet Take 1 Tab by mouth daily.  Qty: 30 Tab, Refills: 2      oxyCODONE-acetaminophen (PERCOCET) 5-325 mg per tablet Take 1-2 Tabs by mouth every four (4) hours as needed for Pain. Max Daily Amount: 12 Tabs.  Qty: 30 Tab, Refills: 0      pravastatin (PRAVACHOL) 80 mg tablet Take 80 mg by mouth nightly.      docusate sodium (COLACE) 100 mg capsule Take 1 Cap by mouth two (2) times a day.  Qty: 20 Cap, Refills: 2      aspirin 81 mg chewable tablet Take 81 mg by mouth daily.         Humalog tid ac 5 units.      Discharge Condition: Stable.          Consults: Pulm CC, Cardiology, Psychiatry.        PHYSICAL EXAM   Visit Vitals  Item Reading   ??? BP 136/78 mmHg   ??? Pulse 65   ??? Temp 98.2 ??F (36.8 ??C)   ??? Resp 13   ??? Ht 5' 11"  (1.803 m)   ??? Wt 79.379 kg (175 lb)   ??? BMI 24.42 kg/m2   ??? SpO2 93%     General: Awake, cooperative, no acute distress????  HEENT: NC, Atraumatic.  PERRLA, EOMI. Anicteric sclerae.  Lungs:  CTA Bilaterally. No Wheezing/Rhonchi/Rales.  Heart:  Regular  rhythm,?? No murmur, No Rubs, No Gallops  Abdomen: Soft, Non distended, Non tender. +Bowel sounds,   Extremities: No c/c/e  Psych:???? Not anxious or agitated.  Neurologic:?? No acute neurological deficits.                                     Admission HPI : Tracy [name] is a 59 y.o. male who has past history of CAD, stent placement, DM, HTN, chronic pain brought to ER by EMS secondary to unresponsive state. History obtained from chart. Per reports patient's Fiance found him unconscious and EMS reported his respiratory rate was about 4/min. He was given narcan and his respiratory rate improved to 10/min, he was bagged and his FSBS 725, his systolic BP in 36U. Patient was brought to ER. He has history of amitriptyline overdose in the past. ??  In ER he was somewhat awake and was put on BiPAP. His urine drug screen positive for cocaine. Wbc elevated at 15.1, potassium at 6.6, blood  glucose at 544, HbA1c of 12.5, creatine at 1.55, lactic acid at 2.7, his head CT with no acute changes. ABG 7.289/45.5/277/21.8/100 on NRB I saw the patient in ER along with intensivist Dr. Jamison Neighbor. Patient had waxing and weaning mental status, he almost vomited. It was decided to intubate him to protect the airway, I was present during the time, no complications. Patient will be admitted to ICU.     Hospital Course : Admitted to icu intubated.  Remained intubated for a few days until he met criteria for extubation.  Treated empirically with abx for the possibility of aspiration.  AKI present and presently resolved. Hyperkalemia present on admission and resolved after admission.      Hyperosmolar nonketotic hyperglycemia noted and treated with ivf and iv insulin.  A1c measured at 12.5.  DM meds adjusted.  Currently taking 26 units of lantus qhs.  Followed by Dr. Jamison Neighbor and Dr. Annamaria Boots of Pulmonary Critical care while in the ICU.  Also seen by Dr. Ronnald Ramp of Cardiology.  He ordered an echocardiogram.  Normal left ventricular systolic pressure noted. Deemed to be stable from a cardiac standpoint.      Seen by Dr. Gordy Levan of Psychiatry.  Participation in an outpatient substance abuse program was recommended.  He has declined.  Cymbalta was recommended by Psychiatry and started.  He has tolerate this well so far.  He was evaluated for safety issues.  No suicidal/homocidal ideation endorsed.  Psychiatry di not recommend inpatient psychiatric care.  They di however suggest that he follow up with [facility] as an outpatient.    Today feeling well.  No complaints.  Discussed plan to dc home.      Activity: Activity as tolerated and no driving for today    Diet: Diabetic Diet    Follow-up: 1 week with pmd.  F/u with [facility].    Disposition: Home.    Minutes spent on discharge: 50  Labs: Results:       Chemistry Recent Labs      04/13/15   0623  04/11/15   0420   GLU  167*  209*   NA  136  141   K  3.7  3.6   CL  102  106   CO2  28  27    BUN  7  9   CREA  0.74  0.83   CA  8.4*  7.8*   AGAP  6  8   BUCR  9*  11*   AP   --   26*   TP   --   5.3*   ALB   --   2.2*   GLOB   --   3.1   AGRAT   --   0.7*      CBC w/Diff Recent Labs      04/13/15   0623  04/11/15   0420   WBC  11.3  10.3   RBC  4.21*  4.05*   HGB  12.1*  11.7*   HCT  36.7  35.4*   PLT  247  213   GRANS   --   57   LYMPH   --   33   EOS   --   1      Cardiac Enzymes No results for input(s): CPK, CKND1, MYO in the last 72 hours.    Invalid input(s): Grandfather, TROIP   Coagulation Recent Labs      04/11/15   0915  04/11/15   0215   APTT  87.7*  122.6*       Lipid Panel No results found for: CHOL, CHOLPOCT, CHOLX, CHLST, CHOLV, [id number], HDL, LDL, NLDLCT, DLDL, LDLC, DLDLP, 154008, VLDLC, VLDL, TGL, TGLX, TRIGL, [id number], TRIGP, TGLPOCT, [id number], CHHD, CHHDX   BNP No results for input(s): BNPP in the last 72 hours.   Liver Enzymes Recent Labs      04/11/15   0420   TP  5.3*   ALB  2.2*   AP  26*   SGOT  20      Thyroid Studies No results found for: T4, T3U, TSH, TSHEXT         Significant Diagnostic Studies: Ct Head Wo Cont    04/06/2015   EXAM: CT head  INDICATION: Headache and weakness  COMPARISON: 08/28/2013  TECHNIQUE: Axial CT imaging of the head was performed without intravenous contrast.  One or more dose reduction techniques were used on this CT: automated exposure control, adjustment of the mAs and/or kVp according to patient's size, and iterative reconstruction techniques. The specific techniques utilized on this CT exam have been documented in the patient's electronic medical record.   _______________  FINDINGS:  BRAIN AND POSTERIOR FOSSA: There is minimal periventricular matter hypoattenuation present. The ventricles are stable in size and configuration. Basilar cisterns are patent. Physiologic basal ganglia calcifications noted on the right. There is no intracranial hemorrhage, mass effect, or midline shift. There are no areas of abnormal parenchymal  attenuation.  EXTRA-AXIAL SPACES AND MENINGES: There are no abnormal extra-axial fluid collections.  CALVARIUM: Intact.  SINUSES: Mucosal thickening of the anterior and posterior ethmoid air cells is noted.  OTHER: Atherosclerotic calcification of bilateral carotid siphons present.  _______________     04/06/2015   IMPRESSION:   1. No acute intracranial abnormality. Of note, noncontrast head CT can be normal in the context of early acute stroke. 2. Minimal periventricular white matter hypoattenuation,  which are nonspecific, likely pertains to sequela of chronic ischemic microvascular disease.     Xr Chest [facility]    04/08/2015   Chest, single view  Indication: Respiratory failure, intubated patient, follow-up examination  Comparison: Several prior exams, most recently April 07, 2015.  Findings:  Portable upright AP view of the chest was obtained. There is an endotracheal tube present approximately 2.2 cm above the carina. An esophagogastric tube is present below the diaphragm, the tip collimated from view.  Degree of pulmonary expansion is similar to prior exams. The right costophrenic angle is excluded from view. Mild pulmonary hypoinflation redemonstrated without pneumothorax or large pleural effusion. Cardiac size and mediastinal contours are stable. No acute osseous abnormality. Stigmata of left shoulder rotator cuff calcific tendinosis present.     04/08/2015   Impression:  1. Endotracheal tube and esophagogastric tubes in position as above. 2. Mild persistent pulmonary hypoinflation without acute radiographic cardiopulmonary abnormality.     Xr Chest [facility]    04/07/2015   EXAM: Chest, portable upright, 0545 hours  INDICATION: Acute respiratory failure  COMPARISON: 04/06/2015  _______________  FINDINGS:  ETT is 3.9 cm above the carina, enteric tube tip is at the proximal stomach. Aorta is tortuous, cardiac silhouette is within normal range in size. Pulmonary vessels are normal. Lungs are clear. Costophrenic angles  are sharp. No acute change.  _______________     04/07/2015   IMPRESSION:  No active disease or interval change.     Xr Chest Port    04/06/2015   EXAM: One-view chest  CLINICAL HISTORY: post intubation ,      COMPARISON: 04/06/2015  FINDINGS:  Frontal view of the chest demonstrate no focal airspace consolidation or acute pulmonary process. ET tube tip approximately 2 cm from the carina. Enteric tube coursing towards the stomach distal tip not visualized.     04/06/2015   IMPRESSION: Support tubes in place as described. No acute pulmonary process identified.      Xr Chest [facility]    04/06/2015   Chest, single view  Indication: Shortness of breath, chest pain, patient found unresponsive.  Comparison: Several prior exams, most recently 10/25/2014.  Findings:  Portable upright AP view of the chest was obtained. The patient is rotated on the provided projection of the associated foreshortening of the left hemithorax. Lungs are underexpanded with mild accentuation of contrast to the markings. No focal pneumonic consolidation, pneumothorax, or large pleural effusion. Cardiac size images and contours are stable. No acute osseous normality. Calcific tendinosis of the left shoulder rotator cuff present, unchanged.       04/06/2015   Impression: Underexpanded lungs with associated bronchovascular crowding. No radiographic evidence of acute pulmonary pulmonary abnormality.     Xr Abd Port  1 V    04/06/2015   EXAM:  Frontal view abdominal radiograph.  INDICATION:  Abdominal pain.  COMPARISON: June 13, 2013.  PROVIDED CLINICAL INDICATION: post rt femoral line.  _______________  FINDINGS:   Right femoral line terminates at the level of L4-L5. The catheter has a normal expected course without evidence of kinking.  Bowel gas pattern is normal. Moderate volume of stool in the colon.  _______________     04/06/2015   IMPRESSION:  Standard radiographic appearance to the right femoral line.     Xr Forearm Rt 1 V     04/10/2015   EXAM: Right forearm, one view  INDICATION: Right forearm pain  COMPARISON: None.  _______________  FINDINGS:  No fracture or soft tissue abnormality  identified on this single view provided.  _______________     04/10/2015   IMPRESSION:  Negative single view right forearm radiograph     Xr Humerus Rt 1 V    04/10/2015   EXAM: Portable AP right humerus, one view  INDICATION: Right upper arm pain  COMPARISON: None.  _______________  FINDINGS:  No fracture or osseous lesion. No soft tissue abnormality.  _______________     04/10/2015   IMPRESSION:  Negative single view right humerus radiograph.                 CC: Barton Dubois, MD

## 2015-04-13 NOTE — Progress Notes (Signed)
Problem: Mobility Impaired (Adult and Pediatric)  Goal: *Acute Goals and Plan of Care (Insert Text)  Physical Therapy Goals  Initiated 04/10/2015 and to be accomplished within 2-8 day(s)  1. Patient will move from supine to sit and sit to supine in bed with supervision/set-up.   2. Patient will transfer from bed to chair and chair to bed with supervision/set-up using the least restrictive device.  3. Patient will perform sit to stand with supervision/set-up.  4. Patient will ambulate with supervision/set-up for 150 feet with the least restrictive device.   5. Patient will ascend/descend 2-5 stairs with use of handrail(s) with minimal assistance/contact guard assist as needed for home entry.   Outcome: Progressing Towards Goal  PHYSICAL THERAPY TREATMENT    Patient: Tracy [name] (59 y.o. male)  Date: 04/13/2015  Diagnosis: Unresponsive  Unresponsive <principal problem not specified>  Precautions: Fall   Chart, physical therapy assessment, plan of care and goals were reviewed.      ASSESSMENT:  Patient is independent with bed mobility, supervision for simple transfers.  Patient able to ambulate collectively 200 ft initially without assistive device, balance fair at best, mild scissoring noted, path deviations, required min A from PT to maintain safe ambulation. The last 25 % of ambulation task, patient utilized RW, safety and balance significantly improved, demonstrated modified independence with RW for ambulation.  Recommend [place] of RW for initial transition to home.  Progression toward goals:  [X]       Improving appropriately and progressing toward goals  [ ]       Improving slowly and progressing toward goals  [ ]       Not making progress toward goals and plan of care will be adjusted       PLAN:  Patient continues to benefit from skilled intervention to address the above impairments. Continue treatment per established plan of care.  Discharge Recommendations:  Home Health   Further Equipment Recommendations for Discharge:  rolling walker       SUBJECTIVE:   Patient stated ???I think I am doing pretty good.???      OBJECTIVE DATA SUMMARY:   Critical Behavior:  Neurologic State: Alert  Orientation Level: Oriented X4  Cognition: Follows commands  Safety/Judgement: Awareness of environment, Fall prevention  Functional Mobility Training:  Bed Mobility:  Rolling: Independent  Supine to Sit: Independent  Scooting: Independent  Transfers:  Sit to Stand: Independent  Stand to Sit: Independent  Bed to Chair: Supervision  Balance:  Sitting: Intact  Standing: Impaired;Pull to stand;With support (without assistive device)  Standing - Static: Good  Standing - Dynamic : Fair  Ambulation/Gait Training:  Distance (ft): 200 Feet (ft)  Assistive Device: Gait belt;Walker, rolling  Ambulation - Level of Assistance: Minimal assistance  Gait Abnormalities: Decreased step clearance;Scissoring;Path deviations  Base of Support: Narrowed  Speed/Cadence: Pace decreased (<100 feet/min)  Pain:  Pain Scale 1: Numeric (0 - 10)  Pain Intensity 1: 0  Pain Location 1: Back  Pain Orientation 1: Lower  Pain Description 1: Aching  Pain Intervention(s) 1: Medication (see MAR)  Activity Tolerance:   Good  Please refer to the flowsheet for vital signs taken during this treatment.  After treatment:   [X]  Patient left in no apparent distress sitting up in chair  [ ]  Patient left in no apparent distress in bed  [X]  Call bell left within reach  [X]  Nursing notified  [ ]  Caregiver present  [ ]  Bed alarm activated      Gayla Medicus, PT  Time Calculation: 11 mins

## 2015-04-13 NOTE — Progress Notes (Signed)
Met with pt at bedside. Pt awaiting his fiance to pick him up, declines home health follow up and denies need for RW at this time.  PCP follow up scheduled and placed in AVS, phone number to [facility] placed in AVS as well.    Care Management Interventions  PCP Verified by CM: Yes  Palliative Care Consult: No  Transition of Care Consult (CM Consult): Discharge Planning  MyChart Signup: No  Discharge Durable Medical Equipment: No  Physical Therapy Consult: Yes  Occupational Therapy Consult: Yes  Speech Therapy Consult: No  Current Support Network: Own Home  Confirm Follow Up Transport: Family  Plan discussed with Pt/Family/Caregiver: Yes  Discharge Location  Discharge Placement: Home

## 2015-08-10 ENCOUNTER — Observation Stay
Admit: 2015-08-10 | Discharge: 2015-08-10 | Disposition: A | Discharge status: Home / Self Care | Payer: Medicare Other | Attending: Cardiology | Admitting: Cardiology

## 2015-08-10 ENCOUNTER — Observation Stay
Admission: EM | Admit: 2015-08-10 | Discharge: 2015-08-11 | Disposition: A | Discharge status: Home / Self Care | Payer: Medicare Other | Attending: Internal Medicine | Admitting: Internal Medicine

## 2015-08-10 ENCOUNTER — Observation Stay: Payer: Medicare Other

## 2015-08-10 ENCOUNTER — Encounter: Payer: Self-pay | Admitting: Emergency Medicine

## 2015-08-10 ENCOUNTER — Emergency Department: Payer: Medicare Other

## 2015-08-10 DIAGNOSIS — Z8249 Family history of ischemic heart disease and other diseases of the circulatory system: Secondary | ICD-10-CM | POA: Diagnosis not present

## 2015-08-10 DIAGNOSIS — I340 Nonrheumatic mitral (valve) insufficiency: Secondary | ICD-10-CM | POA: Diagnosis not present

## 2015-08-10 DIAGNOSIS — R911 Solitary pulmonary nodule: Secondary | ICD-10-CM | POA: Diagnosis not present

## 2015-08-10 DIAGNOSIS — Z23 Encounter for immunization: Secondary | ICD-10-CM | POA: Diagnosis not present

## 2015-08-10 DIAGNOSIS — R079 Chest pain, unspecified: Secondary | ICD-10-CM | POA: Diagnosis present

## 2015-08-10 DIAGNOSIS — I252 Old myocardial infarction: Secondary | ICD-10-CM | POA: Diagnosis not present

## 2015-08-10 DIAGNOSIS — R072 Precordial pain: Secondary | ICD-10-CM | POA: Diagnosis not present

## 2015-08-10 DIAGNOSIS — E785 Hyperlipidemia, unspecified: Secondary | ICD-10-CM | POA: Diagnosis not present

## 2015-08-10 DIAGNOSIS — Z833 Family history of diabetes mellitus: Secondary | ICD-10-CM | POA: Insufficient documentation

## 2015-08-10 DIAGNOSIS — F17210 Nicotine dependence, cigarettes, uncomplicated: Secondary | ICD-10-CM | POA: Diagnosis not present

## 2015-08-10 DIAGNOSIS — I2510 Atherosclerotic heart disease of native coronary artery without angina pectoris: Secondary | ICD-10-CM | POA: Diagnosis not present

## 2015-08-10 DIAGNOSIS — E1165 Type 2 diabetes mellitus with hyperglycemia: Secondary | ICD-10-CM | POA: Diagnosis not present

## 2015-08-10 DIAGNOSIS — R0602 Shortness of breath: Secondary | ICD-10-CM | POA: Insufficient documentation

## 2015-08-10 DIAGNOSIS — Z794 Long term (current) use of insulin: Secondary | ICD-10-CM | POA: Diagnosis not present

## 2015-08-10 DIAGNOSIS — M79606 Pain in leg, unspecified: Secondary | ICD-10-CM | POA: Diagnosis not present

## 2015-08-10 DIAGNOSIS — I10 Essential (primary) hypertension: Secondary | ICD-10-CM | POA: Insufficient documentation

## 2015-08-10 DIAGNOSIS — Z955 Presence of coronary angioplasty implant and graft: Secondary | ICD-10-CM | POA: Diagnosis not present

## 2015-08-10 DIAGNOSIS — I509 Heart failure, unspecified: Secondary | ICD-10-CM | POA: Insufficient documentation

## 2015-08-10 DIAGNOSIS — R0789 Other chest pain: Secondary | ICD-10-CM | POA: Diagnosis not present

## 2015-08-10 DIAGNOSIS — I071 Rheumatic tricuspid insufficiency: Secondary | ICD-10-CM | POA: Diagnosis not present

## 2015-08-10 DIAGNOSIS — J439 Emphysema, unspecified: Secondary | ICD-10-CM | POA: Insufficient documentation

## 2015-08-10 DIAGNOSIS — Z7982 Long term (current) use of aspirin: Secondary | ICD-10-CM | POA: Diagnosis not present

## 2015-08-10 DIAGNOSIS — Z79899 Other long term (current) drug therapy: Secondary | ICD-10-CM | POA: Diagnosis not present

## 2015-08-10 DIAGNOSIS — M549 Dorsalgia, unspecified: Secondary | ICD-10-CM | POA: Insufficient documentation

## 2015-08-10 DIAGNOSIS — G8929 Other chronic pain: Secondary | ICD-10-CM | POA: Insufficient documentation

## 2015-08-10 DIAGNOSIS — R110 Nausea: Secondary | ICD-10-CM | POA: Insufficient documentation

## 2015-08-10 DIAGNOSIS — E1140 Type 2 diabetes mellitus with diabetic neuropathy, unspecified: Secondary | ICD-10-CM | POA: Diagnosis not present

## 2015-08-10 HISTORY — DX: Other chronic pain: G89.29

## 2015-08-10 HISTORY — DX: Atherosclerotic heart disease of native coronary artery without angina pectoris: I25.10

## 2015-08-10 HISTORY — DX: Polyneuropathy, unspecified: G62.9

## 2015-08-10 HISTORY — DX: Essential (primary) hypertension: I10

## 2015-08-10 HISTORY — DX: Pain in leg, unspecified: M79.606

## 2015-08-10 HISTORY — DX: Dorsalgia, unspecified: M54.9

## 2015-08-10 HISTORY — DX: Type 2 diabetes mellitus without complications: E11.9

## 2015-08-10 HISTORY — DX: Heart failure, unspecified: I50.9

## 2015-08-10 LAB — LIPID PANEL
Cholesterol: 186 mg/dL (ref 0–200)
HDL: 25 mg/dL — ABNORMAL LOW (ref >40)
LDL Cholesterol: 132 mg/dL — ABNORMAL HIGH (ref 0–99)
Total CHOL/HDL Ratio: 7.4 RATIO
Triglycerides: 144 mg/dL (ref <150)
VLDL: 29 mg/dL (ref 0–40)

## 2015-08-10 LAB — COMPREHENSIVE METABOLIC PANEL
ALBUMIN: 4 g/dL (ref 3.5–5.0)
ALT: 38 U/L (ref 17–63)
ANION GAP: 12 (ref 5–15)
AST: 30 U/L (ref 15–41)
Alkaline Phosphatase: 33 U/L — ABNORMAL LOW (ref 38–126)
BUN: 20 mg/dL (ref 6–20)
CHLORIDE: 104 mmol/L (ref 101–111)
CO2: 23 mmol/L (ref 22–32)
Calcium: 9.5 mg/dL (ref 8.9–10.3)
Creatinine, Ser: 0.84 mg/dL (ref 0.61–1.24)
GFR calc Af Amer: >60 mL/min (ref >60)
GFR calc non Af Amer: >60 mL/min (ref >60)
GLUCOSE: 239 mg/dL — AB (ref 65–99)
POTASSIUM: 3.8 mmol/L (ref 3.5–5.1)
Sodium: 139 mmol/L (ref 135–145)
Total Bilirubin: 0.6 mg/dL (ref 0.3–1.2)
Total Protein: 7.3 g/dL (ref 6.5–8.1)

## 2015-08-10 LAB — GLUCOSE, CAPILLARY
Glucose-Capillary: 196 mg/dL — ABNORMAL HIGH (ref 65–99)
Glucose-Capillary: 273 mg/dL — ABNORMAL HIGH (ref 65–99)
Glucose-Capillary: 278 mg/dL — ABNORMAL HIGH (ref 65–99)

## 2015-08-10 LAB — TROPONIN I
Troponin I: <0.03 ng/mL (ref <0.031)
Troponin I: <0.03 ng/mL (ref <0.031)
Troponin I: <0.03 ng/mL (ref <0.031)

## 2015-08-10 LAB — URINE DRUG SCREEN, QUALITATIVE (ARMC ONLY)
AMPHETAMINES, UR SCREEN: NOT DETECTED
Barbiturates, Ur Screen: NOT DETECTED
Benzodiazepine, Ur Scrn: NOT DETECTED
COCAINE METABOLITE, UR ~~LOC~~: NOT DETECTED
Cannabinoid 50 Ng, Ur ~~LOC~~: NOT DETECTED
MDMA (ECSTASY) UR SCREEN: NOT DETECTED
METHADONE SCREEN, URINE: NOT DETECTED
OPIATE, UR SCREEN: POSITIVE — AB
Phencyclidine (PCP) Ur S: NOT DETECTED
Tricyclic, Ur Screen: NOT DETECTED

## 2015-08-10 LAB — CBC
HCT: 44.7 % (ref 40.0–52.0)
Hemoglobin: 14.7 g/dL (ref 13.0–18.0)
MCH: 28.9 pg (ref 26.0–34.0)
MCHC: 33 g/dL (ref 32.0–36.0)
MCV: 87.8 fL (ref 80.0–100.0)
PLATELETS: 219 10*3/uL (ref 150–440)
RBC: 5.09 MIL/uL (ref 4.40–5.90)
RDW: 13.4 % (ref 11.5–14.5)
WBC: 8.5 10*3/uL (ref 3.8–10.6)

## 2015-08-10 LAB — HEMOGLOBIN A1C: Hgb A1c MFr Bld: 14.7 % — ABNORMAL HIGH (ref 4.0–6.0)

## 2015-08-10 LAB — TSH: TSH: 0.024 u[IU]/mL — ABNORMAL LOW (ref 0.350–4.500)

## 2015-08-10 MED ORDER — INSULIN ASPART 100 UNIT/ML ~~LOC~~ SOLN
0.0000 [IU] | Freq: Every day | SUBCUTANEOUS | Status: DC
  Administered 2015-08-10: 4 [IU] via SUBCUTANEOUS
  Filled 2015-08-10: qty 4

## 2015-08-10 MED ORDER — MORPHINE SULFATE (PF) 4 MG/ML IV SOLN
4.0000 mg | Freq: Once | INTRAVENOUS | Status: AC
  Administered 2015-08-10: 4 mg via INTRAVENOUS

## 2015-08-10 MED ORDER — MORPHINE SULFATE (PF) 2 MG/ML IV SOLN
2.0000 mg | INTRAVENOUS | Status: DC | PRN
Start: 2015-08-10 — End: 2015-08-11
  Administered 2015-08-10: 2 mg via INTRAVENOUS
  Filled 2015-08-10: qty 1

## 2015-08-10 MED ORDER — ONDANSETRON HCL 4 MG/2ML IJ SOLN
INTRAMUSCULAR | Status: AC
  Administered 2015-08-10: 4 mg via INTRAVENOUS
  Filled 2015-08-10: qty 2

## 2015-08-10 MED ORDER — LABETALOL HCL 5 MG/ML IV SOLN: 10.0000 mg | INTRAVENOUS | Status: DC | PRN

## 2015-08-10 MED ORDER — INFLUENZA VAC SPLIT QUAD 0.5 ML IM SUSY
0.5000 mL | PREFILLED_SYRINGE | INTRAMUSCULAR | Status: AC
  Administered 2015-08-11: 0.5 mL via INTRAMUSCULAR
  Filled 2015-08-10: qty 0.5

## 2015-08-10 MED ORDER — ACETAMINOPHEN 650 MG RE SUPP: 650.0000 mg | Freq: Four times a day (QID) | RECTAL | Status: DC | PRN

## 2015-08-10 MED ORDER — DOCUSATE SODIUM 100 MG PO CAPS
100.0000 mg | ORAL_CAPSULE | Freq: Two times a day (BID) | ORAL | Status: DC
  Filled 2015-08-10 (×2): qty 1

## 2015-08-10 MED ORDER — ONDANSETRON HCL 4 MG PO TABS: 4.0000 mg | ORAL_TABLET | Freq: Four times a day (QID) | ORAL | Status: DC | PRN

## 2015-08-10 MED ORDER — IOHEXOL 350 MG/ML SOLN
100.0000 mL | Freq: Once | INTRAVENOUS | Status: AC | PRN
  Administered 2015-08-10: 100 mL via INTRAVENOUS

## 2015-08-10 MED ORDER — ASPIRIN EC 325 MG PO TBEC
325.0000 mg | DELAYED_RELEASE_TABLET | Freq: Every day | ORAL | Status: DC
Start: 2015-08-10 — End: 2015-08-11
  Administered 2015-08-11: 325 mg via ORAL
  Filled 2015-08-10 (×2): qty 1

## 2015-08-10 MED ORDER — HEPARIN SODIUM (PORCINE) 5000 UNIT/ML IJ SOLN
5000.0000 [IU] | Freq: Three times a day (TID) | INTRAMUSCULAR | Status: DC
Start: 2015-08-10 — End: 2015-08-11
  Administered 2015-08-10 – 2015-08-11 (×3): 5000 [IU] via SUBCUTANEOUS
  Filled 2015-08-10 (×2): qty 1

## 2015-08-10 MED ORDER — SODIUM CHLORIDE 0.9 % IJ SOLN
3.0000 mL | Freq: Two times a day (BID) | INTRAMUSCULAR | Status: DC
  Administered 2015-08-10 – 2015-08-11 (×3): 3 mL via INTRAVENOUS

## 2015-08-10 MED ORDER — OXYCODONE HCL 5 MG PO TABS
5.0000 mg | ORAL_TABLET | Freq: Four times a day (QID) | ORAL | Status: DC | PRN
  Administered 2015-08-10 – 2015-08-11 (×3): 5 mg via ORAL
  Filled 2015-08-10 (×3): qty 1

## 2015-08-10 MED ORDER — METHYLPREDNISOLONE SODIUM SUCC 40 MG IJ SOLR
40.0000 mg | Freq: Three times a day (TID) | INTRAMUSCULAR | Status: AC
  Administered 2015-08-10 – 2015-08-11 (×3): 40 mg via INTRAVENOUS
  Filled 2015-08-10 (×3): qty 1

## 2015-08-10 MED ORDER — ONDANSETRON HCL 4 MG/2ML IJ SOLN
4.0000 mg | Freq: Once | INTRAMUSCULAR | Status: AC
  Administered 2015-08-10: 4 mg via INTRAVENOUS

## 2015-08-10 MED ORDER — GABAPENTIN 600 MG PO TABS
600.0000 mg | ORAL_TABLET | Freq: Two times a day (BID) | ORAL | Status: DC
  Administered 2015-08-10 – 2015-08-11 (×2): 600 mg via ORAL
  Filled 2015-08-10 (×2): qty 1

## 2015-08-10 MED ORDER — INSULIN ASPART 100 UNIT/ML ~~LOC~~ SOLN
0.0000 [IU] | Freq: Three times a day (TID) | SUBCUTANEOUS | Status: DC
  Administered 2015-08-10 – 2015-08-11 (×4): 5 [IU] via SUBCUTANEOUS
  Filled 2015-08-10 (×4): qty 5

## 2015-08-10 MED ORDER — ACETAMINOPHEN 325 MG PO TABS: 650.0000 mg | ORAL_TABLET | Freq: Four times a day (QID) | ORAL | Status: DC | PRN

## 2015-08-10 MED ORDER — NITROGLYCERIN 2 % TD OINT
0.5000 [in_us] | TOPICAL_OINTMENT | Freq: Four times a day (QID) | TRANSDERMAL | Status: DC
  Administered 2015-08-10 – 2015-08-11 (×5): 0.5 [in_us] via TOPICAL
  Filled 2015-08-10 (×6): qty 1

## 2015-08-10 MED ORDER — METOPROLOL TARTRATE 25 MG PO TABS
12.5000 mg | ORAL_TABLET | Freq: Two times a day (BID) | ORAL | Status: DC
  Administered 2015-08-10 – 2015-08-11 (×3): 12.5 mg via ORAL
  Filled 2015-08-10 (×3): qty 1

## 2015-08-10 MED ORDER — NICOTINE 14 MG/24HR TD PT24
14.0000 mg | MEDICATED_PATCH | Freq: Every day | TRANSDERMAL | Status: DC
  Administered 2015-08-10 – 2015-08-11 (×2): 14 mg via TRANSDERMAL
  Filled 2015-08-10 (×2): qty 1

## 2015-08-10 MED ORDER — INSULIN GLARGINE 100 UNIT/ML ~~LOC~~ SOLN
20.0000 [IU] | Freq: Every day | SUBCUTANEOUS | Status: DC
  Administered 2015-08-10: 20 [IU] via SUBCUTANEOUS
  Filled 2015-08-10 (×2): qty 0.2

## 2015-08-10 MED ORDER — MORPHINE SULFATE (PF) 4 MG/ML IV SOLN
INTRAVENOUS | Status: AC
  Administered 2015-08-10: 4 mg via INTRAVENOUS
  Filled 2015-08-10: qty 1

## 2015-08-10 MED ORDER — ONDANSETRON HCL 4 MG/2ML IJ SOLN
4.0000 mg | Freq: Four times a day (QID) | INTRAMUSCULAR | Status: DC | PRN
  Administered 2015-08-10: 4 mg via INTRAVENOUS
  Filled 2015-08-10 (×2): qty 2

## 2015-08-10 NOTE — ED Notes (Addendum)
Pt rec'd 324 mg ASA by EMS PTA.

## 2015-08-10 NOTE — ED Notes (Signed)
MD at bedside. 

## 2015-08-10 NOTE — Progress Notes (Signed)
Patient alert and oriented x4. Oriented to room, unit, and call bell. Admission completed. No complaints at this time. Will cont to assess. Skin assessment verified by Jessica Christmas, RN. Telemetry box verified. Belissa Kooy R [place]   

## 2015-08-10 NOTE — Consult Note (Signed)
[place]  CARDIOLOGY CONSULT NOTE  Patient ID: Richard [name] MRN: [id number] DOB/AGE: 06-04-1956 59 y.o.  Admit date: 08/10/2015 Referring Physician [facility] Primary Physician   Primary Cardiologist   Reason for Consultation atypical chest pain  HPI: Asked to see 59 year old male with apparent history of coronary artery disease status post PCI per his report done in [place]. Patient describes midsternal left-sided chest pain. It is worse with deep palpation and deep breath. He has ruled out for myocardial infarction with normal serum troponins. Electrocardiogram is normal. Patient states that he takes his medications. He states he has some shortness of breath. He describes it as 10 out of 10. He states that morphine helps the pain. Patient continues to smoke cigarettes. States that he takes atenolol,, oxycodone, insulin, hydromorphone as well as Neurontin at home.  ROS Review of Systems - History obtained from chart review and the patient General ROS: positive for  - Chest pain Respiratory ROS: positive for - shortness of breath Cardiovascular ROS: positive for - chest pain Gastrointestinal ROS: no abdominal pain, change in bowel habits, or black or bloody stools Musculoskeletal ROS: negative Neurological ROS: no TIA or stroke symptoms   Past Medical History  Diagnosis Date  . Hypertension   . Diabetes mellitus without complication ([place])   . Coronary artery disease   . Neuropathy ([place])   . Chronic back pain   . Chronic leg pain     Family History  Problem Relation Age of Onset  . CAD Mother   . Diabetes Mellitus II Father     Social History   Social History  . Marital Status: Single    Spouse Name: N/A  . Number of Children: N/A  . Years of Education: N/A   Occupational History  . Not on file.   Social History Main Topics  . Smoking status: Current Every Day Smoker -- 0.50 packs/day  . Smokeless tobacco: Not on file  .  Alcohol Use: No  . Drug Use: No     Comment: Has used in the past  . Sexual Activity: Not on file   Other Topics Concern  . Not on file   Social History Narrative  . No narrative on file    Past Surgical History  Procedure Laterality Date  . Coronary angioplasty with stent placement       Prescriptions prior to admission  Medication Sig Dispense Refill Last Dose  . atenolol (TENORMIN) 25 MG tablet Take 25 mg by mouth daily.   Past Month at Unknown time  . gabapentin (NEURONTIN) 600 MG tablet Take 600 mg by mouth 2 (two) times daily.   Past Month at Unknown time  . HYDROmorphone HCl (EXALGO) 8 MG T24A SR tablet Take 8 mg by mouth daily.   Past Month at Unknown time  . insulin glargine (LANTUS) 100 UNIT/ML injection Inject into the skin at bedtime. Sliding scale. Max out at 50 units   Past Month at Unknown time  . insulin regular (NOVOLIN R,HUMULIN R) 100 units/mL injection Inject 30 Units into the skin 3 (three) times daily before meals.   Past Month at Unknown time  . oxyCODONE-acetaminophen (PERCOCET/ROXICET) 5-325 MG tablet Take 1 tablet by mouth every 6 (six) hours as needed for severe pain.   Past Month at Unknown time    Physical Exam: Blood pressure 121/80, pulse 110, temperature 98.1 F (36.7 C), temperature source Oral, resp. rate 19, height 5\' 10"  (1.778 m), weight 74.163 kg (163  lb 8 oz), SpO2 97 %.    General appearance: alert Resp: clear to auscultation bilaterally Cardio: regular rate and rhythm, S1, S2 normal, no murmur, click, rub or gallop GI: soft, non-tender; bowel sounds normal; no masses,  no organomegaly Extremities: extremities normal, atraumatic, no cyanosis or edema Neurologic: Grossly normal Labs:   Lab Results  Component Value Date   WBC 8.5 08/10/2015   HGB 14.7 08/10/2015   HCT 44.7 08/10/2015   MCV 87.8 08/10/2015   PLT 219 08/10/2015    Recent Labs Lab 08/10/15 0540  NA 139  K 3.8  CL 104  CO2 23  BUN 20  CREATININE 0.84  CALCIUM  9.5  PROT 7.3  BILITOT 0.6  ALKPHOS 33*  ALT 38  AST 30  GLUCOSE 239*   Lab Results  Component Value Date   TROPONINI <0.03 08/10/2015      Radiology: No acute cardiopulmonary process EKG: Sinus rhythm with no ischemia  ASSESSMENT AND PLAN:  59 year old male with history of chronic pain on chronic narcotics who states that he has had stents 3 in the past all done at unknown hospital in [place]. He presents with chest pain with atypical features. He is ruled out for myocardial infarction. EKG is unremarkable. Pain does not appear to be secondary to ischemia. Pain is relieved with narcotics. Would not proceed with invasive evaluation at this time as he is ruled out. Consideration for functional study in the morning could be raised to rule out ischemic etiology. Also proceed with an echocardiogram to evaluate for wall motion abnormality and make further recommendations based on results of this study.  This does not appear to be an acute coronary event. Signed: Teodoro Spray MD, [facility] 08/10/2015, 1:15 PM

## 2015-08-10 NOTE — ED Notes (Signed)
Dr Diamond at bedside. 

## 2015-08-10 NOTE — ED Provider Notes (Signed)
[facility] Emergency Department Provider Note  ____________________________________________  Time seen: 5:45 AM  I have reviewed the triage vital signs and the nursing notes.   HISTORY  Chief Complaint Chest Pain     HPI Richard [name] is a 59 y.o. male presents with acute onset of central chest pain that radiates to his neck and left arm on awakening this morning approximately one hour ago. Patient states his current pain score 7 out of 10. Patient states that the pain is accompanied by nausea however he denies any dyspnea palpitations or dizziness. Patient admits to a history of 2 previous myocardial infarctions resulting in 2 cardiac stents. Patient admits to cocaine abuse in the past but has not used cocaine in years". Of note patient received 324 grams of aspirin via EMS.     Past Medical History  Diagnosis Date  . Hypertension   . Diabetes mellitus without complication ([place])   . Coronary artery disease   . Neuropathy ([place])   . Chronic back pain   . Chronic leg pain     There are no active problems to display for this patient.   Past Surgical History  Procedure Laterality Date  . Coronary angioplasty with stent placement      No current outpatient prescriptions on file.  Allergies Review of patient's allergies indicates no known allergies.  History reviewed. No pertinent family history.  Social History Social History  Substance Use Topics  . Smoking status: Current Every Day Smoker -- 0.50 packs/day  . Smokeless tobacco: None  . Alcohol Use: No    Review of Systems  Constitutional: Negative for fever. Eyes: Negative for visual changes. ENT: Negative for sore throat. Cardiovascular: Positive for chest pain. Respiratory: Negative for shortness of breath. Gastrointestinal: Negative for abdominal pain, vomiting and diarrhea. Genitourinary: Negative for dysuria. Musculoskeletal: Negative for back pain. Skin: Negative for  rash. Neurological: Negative for headaches, focal weakness or numbness.   10-point ROS otherwise negative.  ____________________________________________   PHYSICAL EXAM:  VITAL SIGNS: ED Triage Vitals  Enc Vitals Group     BP 08/10/15 0540 136/84 mmHg     Pulse Rate 08/10/15 0540 98     Resp 08/10/15 0540 20     Temp 08/10/15 0540 98.3 F (36.8 C)     Temp Source 08/10/15 0540 Oral     SpO2 08/10/15 0540 99 %     Weight 08/10/15 0540 175 lb (79.379 kg)     Height --      Head Cir --      Peak Flow --      Pain Score 08/10/15 0542 7     Pain Loc --      Pain Edu? --      Excl. in [place]? --     Constitutional: Alert and oriented. Well appearing and in no distress. Eyes: Conjunctivae are normal. PERRL. Normal extraocular movements. ENT   Head: Normocephalic and atraumatic.   Nose: No congestion/rhinnorhea.   Mouth/Throat: Mucous membranes are moist.   Neck: No stridor. Hematological/Lymphatic/Immunilogical: No cervical lymphadenopathy. Cardiovascular: Normal rate, regular rhythm. Normal and symmetric distal pulses are present in all extremities. No murmurs, rubs, or gallops. Respiratory: Normal respiratory effort without tachypnea nor retractions. Breath sounds are clear and equal bilaterally. No wheezes/rales/rhonchi. Gastrointestinal: Soft and nontender. No distention. There is no CVA tenderness. Genitourinary: deferred Musculoskeletal: Nontender with normal range of motion in all extremities. No joint effusions.  No lower extremity tenderness nor edema. Neurologic:  Normal speech  and language. No gross focal neurologic deficits are appreciated. Speech is normal.  Skin:  Skin is warm, dry and intact. No rash noted. Psychiatric: Mood and affect are normal. Speech and behavior are normal. Patient exhibits appropriate insight and judgment.  ____________________________________________    LABS (pertinent positives/negatives)  Labs Reviewed  COMPREHENSIVE  METABOLIC PANEL - Abnormal; Notable for the following:    Glucose, Bld 239 (*)    Alkaline Phosphatase 33 (*)    All other components within normal limits  CBC  TROPONIN I  URINE DRUG SCREEN, QUALITATIVE ([facility] ONLY)  TSH     ____________________________________________   EKG  ED ECG REPORT I, Cyniah Gossard, [place], the attending physician, personally viewed and interpreted this ECG.   Date: 08/10/2015  EKG Time: 30 9 AM  Rate: 99  Rhythm: Normal sinus rhythm  Axis: None  Intervals: Normal  ST&T Change: None  ____________________________________________    RADIOLOGY     DG Chest 2 View (Final result) Result time: 08/10/15 06:16:31   Final result by Rad Results In Interface (08/10/15 06:16:31)   Narrative:   CLINICAL DATA: Stabbing midsternal chest pain radiating to LEFT arm. Nausea. History of myocardial infarction, hypertension, diabetes, smoker.  EXAM: CHEST 2 VIEW  COMPARISON: None.  FINDINGS: Cardiomediastinal silhouette is normal. Mildly calcified aortic knob. Increased lung volumes, flattened hemidiaphragms can be seen with COPD. The lungs are clear without pleural effusions or focal consolidations. Trachea projects midline and there is no pneumothorax. Soft tissue planes and included osseous structures are non-suspicious.  IMPRESSION: No acute cardiopulmonary process.   Electronically Signed By: Elon Alas M.D. On: 08/10/2015 06:16     INITIAL IMPRESSION / ASSESSMENT AND PLAN / ED COURSE  Pertinent labs & imaging results that were available during my care of the patient were reviewed by me and considered in my medical decision making (see chart for details).  Given History will admit the patient to the hospital for further cardiac evaluation and management. As such patient discussed with Dr Marcille Blanco  ____________________________________________   FINAL CLINICAL IMPRESSION(S) / ED DIAGNOSES  Final diagnoses:  Chest pain,  unspecified chest pain type      Gregor Hams, MD 08/10/15 [phone]

## 2015-08-10 NOTE — Progress Notes (Signed)
Patient ID: Richard [name], male   DOB: 27-Jan-1956, 59 y.o.   MRN: [id number] [facility] PROGRESS NOTE PCP: No PCP Per Patient  HPI/Subjective: Called earlier for chest pain that was worsening. The pain is sharp in nature center of his chest. It radiates into his neck down his left arm and leg. Worse with movement.  Objective: Filed Vitals:   08/10/15 1308  BP: 121/80  Pulse: 110  Temp:   Resp:    No intake or output data in the 24 hours ending 08/10/15 1449 Filed Weights   08/10/15 0540 08/10/15 1005  Weight: 79.379 kg (175 lb) 74.163 kg (163 lb 8 oz)    ROS: Review of Systems  Constitutional: Negative for fever and chills.  Eyes: Negative for blurred vision.  Respiratory: Positive for shortness of breath. Negative for cough.   Cardiovascular: Positive for chest pain.  Gastrointestinal: Negative for nausea, vomiting, abdominal pain, diarrhea and constipation.  Genitourinary: Negative for dysuria.  Musculoskeletal: Negative for joint pain.  Neurological: Negative for dizziness and headaches.   Exam: Physical Exam  Constitutional: He is oriented to person, place, and time.  HENT:  Nose: No mucosal edema.  Mouth/Throat: No oropharyngeal exudate or posterior oropharyngeal edema.  Eyes: Conjunctivae, EOM and lids are normal. Pupils are equal, round, and reactive to light.  Neck: No JVD present. Carotid bruit is not present. No edema present. No thyroid mass and no thyromegaly present.  Cardiovascular: S1 normal and S2 normal.  Tachycardia present.  Exam reveals no gallop.   No murmur heard. Pulses:      Dorsalis pedis pulses are 2+ on the right side, and 2+ on the left side.  Pain to palpation over the chest, especially left parasternal area. Patient with pain when he sat up.  Respiratory: No respiratory distress. He has no wheezes. He has no rhonchi. He has no rales.  GI: Soft. Bowel sounds are normal. There is no tenderness.  Musculoskeletal:       Right  ankle: He exhibits no swelling.       Left ankle: He exhibits no swelling.  Lymphadenopathy:    He has no cervical adenopathy.  Neurological: He is alert and oriented to person, place, and time. No cranial nerve deficit.  Skin: Skin is warm. No rash noted. Nails show no clubbing.  Psychiatric: He has a normal mood and affect.    Data Reviewed: Basic Metabolic Panel:  Recent Labs Lab 08/10/15 0540  NA 139  K 3.8  CL 104  CO2 23  GLUCOSE 239*  BUN 20  CREATININE 0.84  CALCIUM 9.5   Liver Function Tests:  Recent Labs Lab 08/10/15 0540  AST 30  ALT 38  ALKPHOS 33*  BILITOT 0.6  PROT 7.3  ALBUMIN 4.0   CBC:  Recent Labs Lab 08/10/15 0540  WBC 8.5  HGB 14.7  HCT 44.7  MCV 87.8  PLT 219   Cardiac Enzymes:  Recent Labs Lab 08/10/15 0540 08/10/15 0844  TROPONINI <0.03 <0.03   CBG:  Recent Labs Lab 08/10/15 0823 08/10/15 1152  GLUCAP 196* 273*    Studies: Dg Chest 2 View  08/10/2015  CLINICAL DATA:  Stabbing midsternal chest pain radiating to LEFT arm. Nausea. History of myocardial infarction, hypertension, diabetes, smoker. EXAM: CHEST  2 VIEW COMPARISON:  None. FINDINGS: Cardiomediastinal silhouette is normal. Mildly calcified aortic knob. Increased lung volumes, flattened hemidiaphragms can be seen with COPD. The lungs are clear without pleural effusions or focal consolidations. Trachea projects midline and  there is no pneumothorax. Soft tissue planes and included osseous structures are non-suspicious. IMPRESSION: No acute cardiopulmonary process. Electronically Signed   By: Elon Alas M.D.   On: 08/10/2015 06:16   Ct Angio Chest Pe W/cm &/or Wo Cm  08/10/2015  CLINICAL DATA:  Chest pain.  History of coronary stenting. EXAM: CT ANGIOGRAPHY CHEST WITH CONTRAST TECHNIQUE: Multidetector CT imaging of the chest was performed using the standard protocol during bolus administration of intravenous contrast. Multiplanar CT image reconstructions and MIPs  were obtained to evaluate the vascular anatomy. CONTRAST:  127mL OMNIPAQUE IOHEXOL 350 MG/ML SOLN COMPARISON:  Chest radiograph from earlier today. FINDINGS: Mediastinum/Nodes: The study is high quality for the evaluation of pulmonary embolism. There are no filling defects in the central, lobar, segmental or subsegmental pulmonary artery branches to suggest acute pulmonary embolism. Great vessels are normal in course and caliber. Normal heart size. No pericardial fluid/thickening. There is atherosclerosis of the thoracic aorta, the great vessels of the mediastinum and the coronary arteries, including calcified atherosclerotic plaque in the left main, left anterior descending, left circumflex and right coronary arteries. A stent is seen in the proximal right coronary artery. Normal visualized thyroid. Normal esophagus. No axillary, mediastinal or hilar lymphadenopathy. Lungs/Pleura: No pneumothorax. No pleural effusion. There is mild bullous and paraseptal emphysema at the right lung apex. There is small mild retained frothy secretions in the upper right tracheal lumen. There is a 2 mm subpleural pulmonary nodule in the posterior left upper lobe along the major fissure (series 6/image 40). No acute consolidative airspace disease or lung masses. Mild hypoventilatory changes in the dependent lower lobes. Upper abdomen: Unremarkable. Musculoskeletal: No aggressive appearing focal osseous lesions. Mild degenerative changes in the thoracic spine. Review of the MIP images confirms the above findings. IMPRESSION: 1. No pulmonary embolism. 2. Atherosclerosis, including left main and 3 vessel coronary artery disease. Please note that although the presence of coronary artery calcium documents the presence of coronary artery disease, the severity of this disease and any potential stenosis cannot be assessed on this non-gated CT examination. 3. Left upper lobe 2 mm pulmonary nodule. If the patient is at high risk for  bronchogenic carcinoma, follow-up chest CT at 1 year is recommended. If the patient is at low risk, no follow-up is needed. This recommendation follows the consensus statement: Guidelines for Management of Small Pulmonary Nodules Detected on CT Scans: A Statement from the [place] as published in Radiology 2005; 237:395-400. 4. Mild bullous and paraseptal emphysema at the right lung apex. Electronically Signed   By: Ilona Sorrel M.D.   On: 08/10/2015 14:04    Scheduled Meds: . aspirin EC  325 mg Oral Daily  . docusate sodium  100 mg Oral BID  . heparin  5,000 Units Subcutaneous 3 times per day  . [START ON 08/11/2015] Influenza vac split quadrivalent PF  0.5 mL Intramuscular Tomorrow-1000  . insulin aspart  0-5 Units Subcutaneous QHS  . insulin aspart  0-9 Units Subcutaneous TID WC  . methylPREDNISolone (SOLU-MEDROL) injection  40 mg Intravenous Q8H  . metoprolol tartrate  12.5 mg Oral BID  . nicotine  14 mg Transdermal Daily  . nitroGLYCERIN  0.5 inch Topical 4 times per day  . sodium chloride  3 mL Intravenous Q12H    Assessment/Plan:  1. Chest pain atypical. Seems more musculoskeletal such as a costochondritis. I ordered a CT scan of the chest to rule out pulmonary embolism and/or dissection. Cardiac enzymes are negative. I will give a  few doses of IV Solu-Medrol and reevaluate tomorrow. Continue aspirin. Metoprolol ordered. 2. Chronic pain syndrome- I asked the pharmacist confirm these home medications that are listed in the computer.  oral and IV pain medications ordered. 3. Type 2 diabetes mellitus- will start Lantus at night. Sugars may go high with the steroids. 4. Tobacco abuse smoking cessation counseling done 3 minutes by me.  Code Status:     Code Status Orders        Start     Ordered   08/10/15 0823  Full code   Continuous     08/10/15 4403     Disposition Plan: home potentially tomorrow  Consultants:  cardiology  Time spent: 35 minutes  earlier  [place], [place] [facility]

## 2015-08-10 NOTE — ED Notes (Signed)
Attempted to call report; nurse is taking report and will call back in 5 minutes

## 2015-08-10 NOTE — Progress Notes (Signed)
Inpatient Diabetes Program Recommendations  AACE/ADA: New Consensus Statement on Inpatient Glycemic Control (2015)  Target Ranges:  Prepandial:   less than 140 mg/dL      Peak postprandial:   less than 180 mg/dL (1-2 hours)      Critically ill patients:  140 - 180 mg/dL   Review of Glycemic Control:  Results for AUDEL, COAKLEY (MRN [id number]) as of 08/10/2015 12:54  Ref. Range 08/10/2015 08:23 08/10/2015 11:52  Glucose-Capillary Latest Ref Range: 65-99 mg/dL 196 (H) 273 (H)   Diabetes history: Type 2 diabetes Outpatient Diabetes medications: Lantus SSI (max dose 50 units), Novolin R 30 units tid with meals Current orders for Inpatient glycemic control:  Novolog sensitive tid with meals and HS  Inpatient Diabetes Program Recommendations:    Please consider adding Lantus 20 units daily while in the hospital.  Also may consider adding Novolog meal coverage 5 units tid with meals. Please also consider checking A1C.   Thanks, Adah Perl, RN, BC-ADM Inpatient Diabetes Coordinator Pager [phone] (8a-5p)

## 2015-08-10 NOTE — H&P (Signed)
Richard [name] is an 59 y.o. male.   Chief Complaint: Chest pain HPI: The patient presents emergency department complaining of chest pain 10 out of 10 in severity that awoke him from sleep. He states the pain radiated down his left arm and into his left neck as well as down his right leg. He's describes the pain as sharp in character. Following morphine the patient's chest pain has decreased to 5 out of 10 in severity. He reports mild shortness of breath at the start of pain but denies difficulty breathing at this time. He admits to diaphoresis and some nausea both of which have resolved at this time. Has a history significant for coronary artery disease status post stents x3.  Due to ongoing chest pain the emergency department staff called for admission.  Past Medical History  Diagnosis Date  . Hypertension   . Diabetes mellitus without complication ([place])   . Coronary artery disease   . Neuropathy ([place])   . Chronic back pain   . Chronic leg pain     Past Surgical History  Procedure Laterality Date  . Coronary angioplasty with stent placement      Family History  Problem Relation Age of Onset  . CAD Mother   . Diabetes Mellitus II Father    Social History:  reports that he has been smoking.  He does not have any smokeless tobacco history on file. He reports that he does not drink alcohol or use illicit drugs.  Allergies: No Known Allergies  Prior to Admission medications   Not on File   None taken in weeks  Results for orders placed or performed during the hospital encounter of 08/10/15 (from the past 48 hour(s))  CBC     Status: None   Collection Time: 08/10/15  5:40 AM  Result Value Ref Range   WBC 8.5 3.8 - 10.6 K/uL   RBC 5.09 4.40 - 5.90 MIL/uL   Hemoglobin 14.7 13.0 - 18.0 g/dL   HCT 44.7 40.0 - 52.0 %   MCV 87.8 80.0 - 100.0 fL   MCH 28.9 26.0 - 34.0 pg   MCHC 33.0 32.0 - 36.0 g/dL   RDW 13.4 11.5 - 14.5 %   Platelets 219 150 - 440 K/uL  Comprehensive metabolic  panel     Status: Abnormal   Collection Time: 08/10/15  5:40 AM  Result Value Ref Range   Sodium 139 135 - 145 mmol/L   Potassium 3.8 3.5 - 5.1 mmol/L   Chloride 104 101 - 111 mmol/L   CO2 23 22 - 32 mmol/L   Glucose, Bld 239 (H) 65 - 99 mg/dL   BUN 20 6 - 20 mg/dL   Creatinine, Ser 0.84 0.61 - 1.24 mg/dL   Calcium 9.5 8.9 - 10.3 mg/dL   Total Protein 7.3 6.5 - 8.1 g/dL   Albumin 4.0 3.5 - 5.0 g/dL   AST 30 15 - 41 U/L   ALT 38 17 - 63 U/L   Alkaline Phosphatase 33 (L) 38 - 126 U/L   Total Bilirubin 0.6 0.3 - 1.2 mg/dL   GFR calc non Af Amer >60 >60 mL/min   GFR calc Af Amer >60 >60 mL/min    Comment: (NOTE) The eGFR has been calculated using the CKD EPI equation. This calculation has not been validated in all clinical situations. eGFR's persistently <60 mL/min signify possible Chronic Kidney Disease.    Anion gap 12 5 - 15  Troponin I     Status: None  Collection Time: 08/10/15  5:40 AM  Result Value Ref Range   Troponin I <0.03 <0.031 ng/mL    Comment:        NO INDICATION OF MYOCARDIAL INJURY.   Urine Drug Screen, Qualitative ([facility] only)     Status: Abnormal   Collection Time: 08/10/15  6:22 AM  Result Value Ref Range   Tricyclic, Ur Screen NONE DETECTED NONE DETECTED   Amphetamines, Ur Screen NONE DETECTED NONE DETECTED   MDMA (Ecstasy)Ur Screen NONE DETECTED NONE DETECTED   Cocaine Metabolite,Ur [place] NONE DETECTED NONE DETECTED   Opiate, Ur Screen POSITIVE (A) NONE DETECTED   Phencyclidine (PCP) Ur S NONE DETECTED NONE DETECTED   Cannabinoid 50 Ng, Ur [place] NONE DETECTED NONE DETECTED   Barbiturates, Ur Screen NONE DETECTED NONE DETECTED   Benzodiazepine, Ur Scrn NONE DETECTED NONE DETECTED   Methadone Scn, Ur NONE DETECTED NONE DETECTED    Comment: (NOTE) 742  Tricyclics, urine               Cutoff 1000 ng/mL 200  Amphetamines, urine             Cutoff 1000 ng/mL 300  MDMA (Ecstasy), urine           Cutoff 500 ng/mL 400  Cocaine Metabolite, urine       Cutoff 300  ng/mL 500  Opiate, urine                   Cutoff 300 ng/mL 600  Phencyclidine (PCP), urine      Cutoff 25 ng/mL 700  Cannabinoid, urine              Cutoff 50 ng/mL 800  Barbiturates, urine             Cutoff 200 ng/mL 900  Benzodiazepine, urine           Cutoff 200 ng/mL 1000 Methadone, urine                Cutoff 300 ng/mL 1100 1200 The urine drug screen provides only a preliminary, unconfirmed 1300 analytical test result and should not be used for non-medical 1400 purposes. Clinical consideration and professional judgment should 1500 be applied to any positive drug screen result due to possible 1600 interfering substances. A more specific alternate chemical method 1700 must be used in order to obtain a confirmed analytical result.  1800 Gas chromato graphy / mass spectrometry (GC/MS) is the preferred 1900 confirmatory method.    Dg Chest 2 View  08/10/2015  CLINICAL DATA:  Stabbing midsternal chest pain radiating to LEFT arm. Nausea. History of myocardial infarction, hypertension, diabetes, smoker. EXAM: CHEST  2 VIEW COMPARISON:  None. FINDINGS: Cardiomediastinal silhouette is normal. Mildly calcified aortic knob. Increased lung volumes, flattened hemidiaphragms can be seen with COPD. The lungs are clear without pleural effusions or focal consolidations. Trachea projects midline and there is no pneumothorax. Soft tissue planes and included osseous structures are non-suspicious. IMPRESSION: No acute cardiopulmonary process. Electronically Signed   By: Elon Alas M.D.   On: 08/10/2015 06:16    Review of Systems  Constitutional: Positive for weight loss and diaphoresis. Negative for fever and chills.  HENT: Negative for sore throat and tinnitus.   Eyes: Negative for blurred vision and redness.  Respiratory: Negative for cough and shortness of breath.   Cardiovascular: Positive for chest pain. Negative for palpitations, orthopnea and PND.  Gastrointestinal: Positive for nausea.  Negative for vomiting, abdominal pain and diarrhea.  Genitourinary:  Negative for dysuria, urgency and frequency.  Musculoskeletal: Negative for myalgias and joint pain.  Skin: Negative for rash.       No lesions  Neurological: Negative for speech change, focal weakness and weakness.  Endo/Heme/Allergies: Does not bruise/bleed easily.       No temperature intolerance  Psychiatric/Behavioral: Negative for depression and suicidal ideas.    Blood pressure 138/75, pulse 93, temperature 98.3 F (36.8 C), temperature source Oral, resp. rate 20, weight 79.379 kg (175 lb), SpO2 94 %. Physical Exam  Nursing note and vitals reviewed. Constitutional: He is oriented to person, place, and time. He appears well-developed and well-nourished. No distress.  HENT:  Head: Normocephalic and atraumatic.  Mouth/Throat: Oropharynx is clear and moist.  Eyes: Conjunctivae and EOM are normal. Pupils are equal, round, and reactive to light. No scleral icterus.  Neck: Normal range of motion. Neck supple. No JVD present. No tracheal deviation present. No thyromegaly present.  Cardiovascular: Normal rate, regular rhythm and normal heart sounds.  Exam reveals no gallop and no friction rub.   No murmur heard. Respiratory: Effort normal and breath sounds normal. No respiratory distress.  GI: Soft. Bowel sounds are normal. He exhibits no distension. There is no tenderness.  Genitourinary:  Deferred  Musculoskeletal: Normal range of motion. He exhibits no edema.  Lymphadenopathy:    He has no cervical adenopathy.  Neurological: He is alert and oriented to person, place, and time. No cranial nerve deficit.  Skin: Skin is warm and dry. No rash noted. No erythema.  Psychiatric: He has a normal mood and affect. His behavior is normal. Judgment and thought content normal.     Assessment/Plan This is a 59 year old African American male past medical history significant for coronary artery disease admitted for chest  pain. 1. Chest pain: Doesn't patient concerning but ongoing pain as well as already of his pain is more atypical than ischemic chest pain. However it is possible that he had a hypertensive episode which produced some myocardial ischemia. Thankfully initial troponin is negative. We will cycle cardiac enzymes and monitor telemetry. EKG shows no signs of acute ischemia. 2. Coronary artery disease: The patient reports PCI 3. I have started him on aspirin. Cardiology consult pending. 3. Hypertension: Controlled at this time. Labetalol when necessary systolic blood pressure greater then 160. 4. Diabetes mellitus type 2: Elevated blood sugars at this time. The patient states that he had been on oral hypoglycemic medication but cannot remember the name. While hospitalized out of place him on sliding scale. Checking a lipid A1c and lipid panel. 5. Tobacco abuse: The patient states that he has recently started smoking again. I place him on a NicoDerm patch. 6. Chronic pain: Multiple chronic musculoskeletal injuries. Suggest meloxicam or other NSAID therapy once determined the patient will not need cardiac catheterization. Morphine for chest pain for now. 7. DVT prophylaxis: Heparin 8. GI prophylaxis: None The patient is a full code. Time spent on admission was inpatient care possibly 35 minutes  Harrie Foreman 08/10/2015, 7:11 AM

## 2015-08-10 NOTE — ED Notes (Signed)
Pt arrives via EMS from home with c/o CP.  Pt states that he awoke from sleeping with stabbing midsternal cp, 7/10.  Pt also with nausea.  Pt c/o generalized pain from neuropathy and is out of medications for the last 3 weeks.  Pt just moved from [place] to [place].

## 2015-08-11 ENCOUNTER — Encounter: Payer: Self-pay | Admitting: Radiology

## 2015-08-11 ENCOUNTER — Encounter: Payer: Medicare Other | Attending: Cardiology

## 2015-08-11 DIAGNOSIS — R0789 Other chest pain: Secondary | ICD-10-CM | POA: Diagnosis present

## 2015-08-11 LAB — NM MYOCAR MULTI W/SPECT W/WALL MOTION / EF
CHL CUP NUCLEAR SSS: 0
CSEPED: 7 min
CSEPPHR: 164 {beats}/min
Estimated workload: 8.5 METS
Exercise duration (sec): 0 s
LV dias vol: 101 mL
LVSYSVOL: 33 mL
NUC STRESS TID: 0.93
Rest HR: 103 {beats}/min
SDS: 0
SRS: 2

## 2015-08-11 LAB — T4: T4, Total: 8.6 ug/dL (ref 4.5–12.0)

## 2015-08-11 LAB — GLUCOSE, CAPILLARY
Glucose-Capillary: 278 mg/dL — ABNORMAL HIGH (ref 65–99)
Glucose-Capillary: 300 mg/dL — ABNORMAL HIGH (ref 65–99)

## 2015-08-11 LAB — T3: T3, Total: 454 ng/dL — ABNORMAL HIGH (ref 71–180)

## 2015-08-11 MED ORDER — TECHNETIUM TC 99M SESTAMIBI - CARDIOLITE
30.0000 | Freq: Once | INTRAVENOUS | Status: AC | PRN
  Administered 2015-08-11: 30.52 via INTRAVENOUS

## 2015-08-11 MED ORDER — GABAPENTIN 600 MG PO TABS: 600.0000 mg | ORAL_TABLET | Freq: Two times a day (BID) | ORAL | Status: DC

## 2015-08-11 MED ORDER — INSULIN GLARGINE 100 UNIT/ML ~~LOC~~ SOLN: 20.0000 [IU] | Freq: Every day | SUBCUTANEOUS | Status: DC

## 2015-08-11 MED ORDER — ASPIRIN EC 81 MG PO TBEC: 81.0000 mg | DELAYED_RELEASE_TABLET | Freq: Every day | ORAL | Status: DC

## 2015-08-11 MED ORDER — PRAVASTATIN SODIUM 80 MG PO TABS: 80.0000 mg | ORAL_TABLET | Freq: Every day | ORAL | Status: DC

## 2015-08-11 MED ORDER — INSULIN REGULAR HUMAN 100 UNIT/ML IJ SOLN: 8.0000 [IU] | Freq: Three times a day (TID) | INTRAMUSCULAR | Status: DC

## 2015-08-11 MED ORDER — TECHNETIUM TC 99M SESTAMIBI - CARDIOLITE
10.0000 | Freq: Once | INTRAVENOUS | Status: AC | PRN
Start: 2015-08-11 — End: 2015-08-11
  Administered 2015-08-11: 09:00:00 12.91 via INTRAVENOUS

## 2015-08-11 MED ORDER — OXYCODONE-ACETAMINOPHEN 7.5-325 MG PO TABS: 1.0000 | ORAL_TABLET | Freq: Two times a day (BID) | ORAL | Status: DC

## 2015-08-11 MED ORDER — NICOTINE 14 MG/24HR TD PT24: 14.0000 mg | MEDICATED_PATCH | Freq: Every day | TRANSDERMAL | Status: DC

## 2015-08-11 MED ORDER — METOPROLOL TARTRATE 25 MG PO TABS: 12.5000 mg | ORAL_TABLET | Freq: Two times a day (BID) | ORAL | Status: DC

## 2015-08-11 NOTE — Progress Notes (Signed)
Discharge instructions given to patient. IV and tele discontinued. Education given on chest pain and post stress test activity. Prescriptions hand given for all medications to patient. Will follow up with a new PCP. Patient has no further questions.

## 2015-08-11 NOTE — Discharge Instructions (Signed)

## 2015-08-11 NOTE — Discharge Summary (Signed)
[facility] [place] NAME: Richard [name]    MR#:  [id number]  DATE OF BIRTH:  05-10-1956  DATE OF ADMISSION:  08/10/2015 ADMITTING PHYSICIAN: Harrie Foreman, MD  DATE OF DISCHARGE: 08/11/2015  PRIMARY CARE PHYSICIAN: No PCP Per Patient    ADMISSION DIAGNOSIS:  Chest pain, unspecified chest pain type [R07.9]  DISCHARGE DIAGNOSIS:  Active Problems:   Chest pain   SECONDARY DIAGNOSIS:   Past Medical History  Diagnosis Date  . Hypertension   . Diabetes mellitus without complication ([place])   . Coronary artery disease   . Neuropathy ([place])   . Chronic back pain   . Chronic leg pain   . CHF (congestive heart failure) ([place])     HOSPITAL COURSE:   1. Chest pain atypical. Seems more like a costochondritis to me with chest wall pain with palpation. I gave the patient a few doses of Solu-Medrol and pain is relieved on the day of discharge. He had a CT scan of the chest which was negative for pulmonary embolism. He had a stress test that was a low risk scan. Since he does have a history of heart disease continue aspirin and start low-dose metoprolol. Chest wall pain is relieved today. 2. Essential hypertension-Continue metoprolol 3. Type 2 diabetes uncontrolled with hyperglycemia. Patient ran out of his insulin. Start Lantus 20 units subcutaneous at bedtime and short acting insulin prior to meals. 4. Diabetic neuropathy- on gabapentin 5. Chronic back and leg pain- renewed his pain medication 6. Abnormal thyroid function test. I spoke with Dr. Gabriel Carina endocrinology and she will follow-up with him next week and repeat the testing. She will also follow-up for diabetes.   7. Hyperlipidemia unspecified renew pravastatin 8. No signs of congestive heart failure on this hospital stay. 9. Pulmonary nodule follow-up CT scan chest 1 year.  DISCHARGE CONDITIONS:   Satisfactory   CONSULTS OBTAINED:  Treatment Team:  Teodoro Spray,  MD  DRUG ALLERGIES:  No Known Allergies  DISCHARGE MEDICATIONS:   Current Discharge Medication List    START taking these medications   Details  gabapentin (NEURONTIN) 600 MG tablet Take 1 tablet (600 mg total) by mouth 2 (two) times daily. Qty: 60 tablet, Refills: 0    metoprolol tartrate (LOPRESSOR) 25 MG tablet Take 0.5 tablets (12.5 mg total) by mouth 2 (two) times daily. Qty: 30 tablet, Refills: 0    nicotine (NICODERM CQ - DOSED IN MG/24 HOURS) 14 mg/24hr patch Place 1 patch (14 mg total) onto the skin daily. Qty: 28 patch, Refills: 0      CONTINUE these medications which have CHANGED   Details  aspirin EC 81 MG tablet Take 1 tablet (81 mg total) by mouth daily. Qty: 30 tablet, Refills: 0    insulin glargine (LANTUS) 100 UNIT/ML injection Inject 0.2 mLs (20 Units total) into the skin at bedtime. Sliding scale. Max out at 50 units Qty: 10 mL, Refills: 0    insulin regular (NOVOLIN R,HUMULIN R) 100 units/mL injection Inject 0.08 mLs (8 Units total) into the skin 3 (three) times daily before meals. Qty: 10 mL, Refills: 11    oxyCODONE-acetaminophen (PERCOCET) 7.5-325 MG tablet Take 1 tablet by mouth 2 (two) times daily. Qty: 60 tablet, Refills: 0    pravastatin (PRAVACHOL) 80 MG tablet Take 1 tablet (80 mg total) by mouth at bedtime. Qty: 30 tablet, Refills: 0      STOP taking these medications     amLODipine (NORVASC)  10 MG tablet      HYDROmorphone HCl (EXALGO) 8 MG T24A SR tablet          DISCHARGE INSTRUCTIONS:   We'll try to set up follow-up appointment with Dr. Clayborn Bigness medical   follow-up in one week with Dr. Gabriel Carina endocrinology Follow-up 3 weeks Dr. Ubaldo Glassing cardiology  If you experience worsening of your admission symptoms, develop shortness of breath, life threatening emergency, suicidal or homicidal thoughts you must seek medical attention immediately by calling 911 or calling your MD immediately  if symptoms less severe.  You Must read complete  instructions/literature along with all the possible adverse reactions/side effects for all the Medicines you take and that have been prescribed to you. Take any new Medicines after you have completely understood and accept all the possible adverse reactions/side effects.   Please note  You were cared for by a hospitalist during your hospital stay. If you have any questions about your discharge medications or the care you received while you were in the hospital after you are discharged, you can call the unit and asked to speak with the hospitalist on call if the hospitalist that took care of you is not available. Once you are discharged, your primary care physician will handle any further medical issues. Please note that NO REFILLS for any discharge medications will be authorized once you are discharged, as it is imperative that you return to your primary care physician (or establish a relationship with a primary care physician if you do not have one) for your aftercare needs so that they can reassess your need for medications and monitor your lab values.    Today   CHIEF COMPLAINT:   Chief Complaint  Patient presents with  . Chest Pain    HISTORY OF PRESENT ILLNESS:  Richard [name]  is a 59 y.o. male with a known history of CAD presented with chest pain.   VITAL SIGNS:  Blood pressure 127/71, pulse 89, temperature 97.7 F (36.5 C), temperature source Oral, resp. rate 18, height 5\' 10"  (1.778 m), weight 73.71 kg (162 lb 8 oz), SpO2 99 %.    PHYSICAL EXAMINATION:  GENERAL:  59 y.o.-year-old patient lying in the bed with no acute distress.  EYES: Pupils equal, round, reactive to light and accommodation. No scleral icterus. Extraocular muscles intact.  HEENT: Head atraumatic, normocephalic. Oropharynx and nasopharynx clear.  NECK:  Supple, no jugular venous distention. No thyroid enlargement, no tenderness.  LUNGS: Normal breath sounds bilaterally, no wheezing, rales,rhonchi or crepitation.  No use of accessory muscles of respiration.  CARDIOVASCULAR: S1, S2 normal. No murmurs, rubs, or gallops.  ABDOMEN: Soft, non-tender, non-distended. Bowel sounds present. No organomegaly or mass.  EXTREMITIES: No pedal edema, cyanosis, or clubbing.  NEUROLOGIC: Cranial nerves II through XII are intact. Muscle strength 5/5 in all extremities. Sensation intact. Gait not checked.  PSYCHIATRIC: The patient is alert and oriented x 3.  SKIN: No obvious rash, lesion, or ulcer.   DATA REVIEW:   CBC  Recent Labs Lab 08/10/15 0540  WBC 8.5  HGB 14.7  HCT 44.7  PLT 219    Chemistries   Recent Labs Lab 08/10/15 0540  NA 139  K 3.8  CL 104  CO2 23  GLUCOSE 239*  BUN 20  CREATININE 0.84  CALCIUM 9.5  AST 30  ALT 38  ALKPHOS 33*  BILITOT 0.6    Cardiac Enzymes  Recent Labs Lab 08/10/15 2119  TROPONINI <0.03   RADIOLOGY:  Dg Chest 2  View  08/10/2015  CLINICAL DATA:  Stabbing midsternal chest pain radiating to LEFT arm. Nausea. History of myocardial infarction, hypertension, diabetes, smoker. EXAM: CHEST  2 VIEW COMPARISON:  None. FINDINGS: Cardiomediastinal silhouette is normal. Mildly calcified aortic knob. Increased lung volumes, flattened hemidiaphragms can be seen with COPD. The lungs are clear without pleural effusions or focal consolidations. Trachea projects midline and there is no pneumothorax. Soft tissue planes and included osseous structures are non-suspicious. IMPRESSION: No acute cardiopulmonary process. Electronically Signed   By: Elon Alas M.D.   On: 08/10/2015 06:16   Ct Angio Chest Pe W/cm &/or Wo Cm  08/10/2015  CLINICAL DATA:  Chest pain.  History of coronary stenting. EXAM: CT ANGIOGRAPHY CHEST WITH CONTRAST TECHNIQUE: Multidetector CT imaging of the chest was performed using the standard protocol during bolus administration of intravenous contrast. Multiplanar CT image reconstructions and MIPs were obtained to evaluate the vascular anatomy. CONTRAST:   174mL OMNIPAQUE IOHEXOL 350 MG/ML SOLN COMPARISON:  Chest radiograph from earlier today. FINDINGS: Mediastinum/Nodes: The study is high quality for the evaluation of pulmonary embolism. There are no filling defects in the central, lobar, segmental or subsegmental pulmonary artery branches to suggest acute pulmonary embolism. Great vessels are normal in course and caliber. Normal heart size. No pericardial fluid/thickening. There is atherosclerosis of the thoracic aorta, the great vessels of the mediastinum and the coronary arteries, including calcified atherosclerotic plaque in the left main, left anterior descending, left circumflex and right coronary arteries. A stent is seen in the proximal right coronary artery. Normal visualized thyroid. Normal esophagus. No axillary, mediastinal or hilar lymphadenopathy. Lungs/Pleura: No pneumothorax. No pleural effusion. There is mild bullous and paraseptal emphysema at the right lung apex. There is small mild retained frothy secretions in the upper right tracheal lumen. There is a 2 mm subpleural pulmonary nodule in the posterior left upper lobe along the major fissure (series 6/image 40). No acute consolidative airspace disease or lung masses. Mild hypoventilatory changes in the dependent lower lobes. Upper abdomen: Unremarkable. Musculoskeletal: No aggressive appearing focal osseous lesions. Mild degenerative changes in the thoracic spine. Review of the MIP images confirms the above findings. IMPRESSION: 1. No pulmonary embolism. 2. Atherosclerosis, including left main and 3 vessel coronary artery disease. Please note that although the presence of coronary artery calcium documents the presence of coronary artery disease, the severity of this disease and any potential stenosis cannot be assessed on this non-gated CT examination. 3. Left upper lobe 2 mm pulmonary nodule. If the patient is at high risk for bronchogenic carcinoma, follow-up chest CT at 1 year is recommended.  If the patient is at low risk, no follow-up is needed. This recommendation follows the consensus statement: Guidelines for Management of Small Pulmonary Nodules Detected on CT Scans: A Statement from the [place] as published in Radiology 2005; 237:395-400. 4. Mild bullous and paraseptal emphysema at the right lung apex. Electronically Signed   By: Ilona Sorrel M.D.   On: 08/10/2015 14:04   Nm Myocar Multi W/spect W/wall Motion / Ef  08/11/2015   There was no ST segment deviation noted during stress.  The study is normal.  This is a low risk study.  The left ventricular ejection fraction is normal (55-65%).     Management plans discussed with the patient, and he is  in agreement.  CODE STATUS:     Code Status Orders        Start     Ordered   08/10/15 [id number]  Full  code   Continuous     08/10/15 0822      TOTAL TIME TAKING CARE OF THIS PATIENT: 35  minutes.    Loletha Grayer M.D on 08/11/2015 at 1:34 PM  Between 7am to 6pm - Pager - [phone]  After 6pm go to [email or web address] - password [email or web address] [facility]  [facility]  Office  [phone]  CC: Primary care physician; No PCP Per Patient

## 2015-09-25 ENCOUNTER — Encounter: Payer: Self-pay | Admitting: Emergency Medicine

## 2015-09-25 ENCOUNTER — Emergency Department
Admission: EM | Admit: 2015-09-25 | Discharge: 2015-09-25 | Disposition: A | Discharge status: Home / Self Care | Payer: Medicare Other | Attending: Emergency Medicine | Admitting: Emergency Medicine

## 2015-09-25 DIAGNOSIS — I10 Essential (primary) hypertension: Secondary | ICD-10-CM | POA: Insufficient documentation

## 2015-09-25 DIAGNOSIS — F17200 Nicotine dependence, unspecified, uncomplicated: Secondary | ICD-10-CM | POA: Insufficient documentation

## 2015-09-25 DIAGNOSIS — E1349 Other specified diabetes mellitus with other diabetic neurological complication: Secondary | ICD-10-CM

## 2015-09-25 DIAGNOSIS — M25552 Pain in left hip: Secondary | ICD-10-CM | POA: Diagnosis not present

## 2015-09-25 DIAGNOSIS — Z794 Long term (current) use of insulin: Secondary | ICD-10-CM | POA: Insufficient documentation

## 2015-09-25 DIAGNOSIS — Z79899 Other long term (current) drug therapy: Secondary | ICD-10-CM | POA: Insufficient documentation

## 2015-09-25 DIAGNOSIS — M549 Dorsalgia, unspecified: Secondary | ICD-10-CM | POA: Diagnosis present

## 2015-09-25 DIAGNOSIS — M25551 Pain in right hip: Secondary | ICD-10-CM | POA: Diagnosis not present

## 2015-09-25 DIAGNOSIS — Z7982 Long term (current) use of aspirin: Secondary | ICD-10-CM | POA: Diagnosis not present

## 2015-09-25 DIAGNOSIS — E1140 Type 2 diabetes mellitus with diabetic neuropathy, unspecified: Secondary | ICD-10-CM | POA: Insufficient documentation

## 2015-09-25 DIAGNOSIS — G8929 Other chronic pain: Secondary | ICD-10-CM | POA: Insufficient documentation

## 2015-09-25 MED ORDER — OXYCODONE-ACETAMINOPHEN 5-325 MG PO TABS
2.0000 | ORAL_TABLET | Freq: Once | ORAL | Status: AC
  Administered 2015-09-25: 2 via ORAL

## 2015-09-25 MED ORDER — GABAPENTIN 600 MG PO TABS
600.0000 mg | ORAL_TABLET | Freq: Once | ORAL | Status: AC
  Administered 2015-09-25: 600 mg via ORAL

## 2015-09-25 MED ORDER — OXYCODONE-ACETAMINOPHEN 5-325 MG PO TABS: 1.0000 | ORAL_TABLET | Freq: Once | ORAL | Status: DC

## 2015-09-25 MED ORDER — GABAPENTIN 600 MG PO TABS: 600.0000 mg | ORAL_TABLET | Freq: Three times a day (TID) | ORAL | Status: DC

## 2015-09-25 MED ORDER — OXYCODONE-ACETAMINOPHEN 5-325 MG PO TABS: 1.0000 | ORAL_TABLET | Freq: Four times a day (QID) | ORAL | Status: DC | PRN

## 2015-09-25 MED ORDER — GABAPENTIN 300 MG PO CAPS
ORAL_CAPSULE | ORAL | Status: AC
  Administered 2015-09-25: 600 mg
  Filled 2015-09-25: qty 2

## 2015-09-25 MED ORDER — OXYCODONE-ACETAMINOPHEN 5-325 MG PO TABS
ORAL_TABLET | ORAL | Status: AC
  Administered 2015-09-25: 2 via ORAL
  Filled 2015-09-25: qty 2

## 2015-09-25 NOTE — ED Notes (Signed)
Patient with no complaints at this time. Respirations even and unlabored. Skin warm/dry. Discharge instructions reviewed with patient at this time. Patient given opportunity to voice concerns/ask questions. Patient discharged at this time and left Emergency Department with steady gait.   

## 2015-09-25 NOTE — ED Provider Notes (Signed)
[facility] Emergency Department Provider Note  ____________________________________________  Time seen: Approximately [id number] PM  I have reviewed the triage vital signs and the nursing notes.   HISTORY  Chief Complaint Leg Pain; Back Pain; and Hip Pain    HPI Richard [name] is a 60 y.o. male history of hypertension and diabetes who is presenting today with burning pain to the bottoms of his feet which radiates up his legs. He says that he has a history of diabetic neuropathy with similar pain to this. He says he was recently admitted to the hospital and was discharged with Percocet as well as gabapentin. He has since run out of the Percocet and says that his gabapentin is not working. He says he has previously been on morphine for his neuropathy.He is new to the area and does not have a primary care physician to follow up with. He says that he took his blood sugar earlier today and it was 154.   Past Medical History  Diagnosis Date  . Hypertension   . Diabetes mellitus without complication ([place])   . Coronary artery disease   . Neuropathy ([place])   . Chronic back pain   . Chronic leg pain   . CHF (congestive heart failure) [facility])     Patient Active Problem List   Diagnosis Date Noted  . Chest pain 08/10/2015    Past Surgical History  Procedure Laterality Date  . Coronary angioplasty with stent placement      Current Outpatient Rx  Name  Route  Sig  Dispense  Refill  . aspirin EC 81 MG tablet   Oral   Take 1 tablet (81 mg total) by mouth daily.   30 tablet   0   . gabapentin (NEURONTIN) 600 MG tablet   Oral   Take 1 tablet (600 mg total) by mouth 2 (two) times daily.   60 tablet   0   . insulin glargine (LANTUS) 100 UNIT/ML injection   Subcutaneous   Inject 0.2 mLs (20 Units total) into the skin at bedtime. Sliding scale. Max out at 50 units   10 mL   0   . insulin regular (NOVOLIN R,HUMULIN R) 100 units/mL injection   Subcutaneous   Inject  0.08 mLs (8 Units total) into the skin 3 (three) times daily before meals.   10 mL   11   . metoprolol tartrate (LOPRESSOR) 25 MG tablet   Oral   Take 0.5 tablets (12.5 mg total) by mouth 2 (two) times daily.   30 tablet   0   . nicotine (NICODERM CQ - DOSED IN MG/24 HOURS) 14 mg/24hr patch   Transdermal   Place 1 patch (14 mg total) onto the skin daily.   28 patch   0   . oxyCODONE-acetaminophen (PERCOCET) 7.5-325 MG tablet   Oral   Take 1 tablet by mouth 2 (two) times daily.   60 tablet   0   . pravastatin (PRAVACHOL) 80 MG tablet   Oral   Take 1 tablet (80 mg total) by mouth at bedtime.   30 tablet   0     Allergies Review of patient's allergies indicates no known allergies.  Family History  Problem Relation Age of Onset  . CAD Mother   . Diabetes Mellitus II Father     Social History Social History  Substance Use Topics  . Smoking status: Current Every Day Smoker -- 0.50 packs/day  . Smokeless tobacco: None  . Alcohol Use:  No    Review of Systems Constitutional: No fever/chills Eyes: No visual changes. ENT: No sore throat. Cardiovascular: Denies chest pain. Respiratory: Denies shortness of breath. Gastrointestinal: No abdominal pain.  No nausea, no vomiting.  No diarrhea.  No constipation. Genitourinary: Negative for dysuria. Musculoskeletal: Negative for back pain. Skin: Negative for rash. Neurological: Negative for headaches, focal weakness or numbness.  10-point ROS otherwise negative.  ____________________________________________   PHYSICAL EXAM:  VITAL SIGNS: ED Triage Vitals  Enc Vitals Group     BP 09/25/15 1814 122/77 mmHg     Pulse Rate 09/25/15 1814 90     Resp 09/25/15 1814 18     Temp 09/25/15 1814 98.7 F (37.1 C)     Temp Source 09/25/15 1814 Oral     SpO2 09/25/15 1814 98 %     Weight 09/25/15 1814 180 lb (81.647 kg)     Height 09/25/15 1814 5\' 11"  (1.803 m)     Head Cir --      Peak Flow --      Pain Score 09/25/15  1813 10     Pain Loc --      Pain Edu? --      Excl. in [place]? --     Constitutional: Alert and oriented. Well appearing and in no acute distress. Eyes: Conjunctivae are normal. PERRL. EOMI. Head: Atraumatic. Nose: No congestion/rhinnorhea. Mouth/Throat: Mucous membranes are moist.  Oropharynx non-erythematous. Neck: No stridor.   Cardiovascular: Normal rate, regular rhythm. Grossly normal heart sounds.  Good peripheral circulation. Respiratory: Normal respiratory effort.  No retractions. Lungs CTAB. Gastrointestinal: Soft and nontender. No distention. No abdominal bruits. No CVA tenderness. Musculoskeletal: No lower extremity tenderness nor edema.  No joint effusions. Neurologic:  Normal speech and language. No gross focal neurologic deficits are appreciated. No gait instability. Bilateral lower extremities with sensation intact to light touch. He has strength intact as well. Dorsalis pedis pulses intact bilaterally to the feet. There is tenderness to palpation to the soles as well as the mid calves bilaterally. Skin:  Skin is warm, dry and intact. No rash noted. Psychiatric: Mood and affect are normal. Speech and behavior are normal.  ____________________________________________   LABS (all labs ordered are listed, but only abnormal results are displayed)  Labs Reviewed - No data to display ____________________________________________  EKG   ____________________________________________  RADIOLOGY   ____________________________________________   PROCEDURES    ____________________________________________   INITIAL IMPRESSION / ASSESSMENT AND PLAN / ED COURSE  Pertinent labs & imaging results that were available during my care of the patient were reviewed by me and considered in my medical decision making (see chart for details).  ----------------------------------------- 9:10 PM on 09/25/2015 -----------------------------------------  Patient's pain is now improved  with Percocet and Neurontin. We'll write patient a new prescription for Neurontin as he says he is out of it at home. We'll write a short course of Percocet. However, I told the patient I cannot write an extended course of medications from the emergency department for his chronic pain. 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 He'll be following up with Dr. Gabriel Carina.   ____________________________________________   FINAL CLINICAL IMPRESSION(S) / ED DIAGNOSES  Acute on chronic diabetic neuropathy.    Orbie Pyo, MD 09/25/15 2111

## 2015-09-25 NOTE — ED Notes (Addendum)
Pt presents with pain blt feet , legs, and hips that started last night and worsens when he stands, lays down and bends over.Pt takes Gabapentin but "it hasn't done me no good". Pt describes pain as shooting that "goes all the way up". 10/10 pain

## 2015-11-03 ENCOUNTER — Emergency Department: Payer: Medicare Other

## 2015-11-03 ENCOUNTER — Emergency Department
Admission: EM | Admit: 2015-11-03 | Discharge: 2015-11-03 | Disposition: A | Discharge status: Home / Self Care | Payer: Medicare Other | Attending: Emergency Medicine | Admitting: Emergency Medicine

## 2015-11-03 ENCOUNTER — Encounter: Payer: Self-pay | Admitting: *Deleted

## 2015-11-03 DIAGNOSIS — E119 Type 2 diabetes mellitus without complications: Secondary | ICD-10-CM | POA: Diagnosis not present

## 2015-11-03 DIAGNOSIS — Y92410 Unspecified street and highway as the place of occurrence of the external cause: Secondary | ICD-10-CM | POA: Insufficient documentation

## 2015-11-03 DIAGNOSIS — Y998 Other external cause status: Secondary | ICD-10-CM | POA: Diagnosis not present

## 2015-11-03 DIAGNOSIS — I10 Essential (primary) hypertension: Secondary | ICD-10-CM | POA: Diagnosis not present

## 2015-11-03 DIAGNOSIS — Z794 Long term (current) use of insulin: Secondary | ICD-10-CM | POA: Insufficient documentation

## 2015-11-03 DIAGNOSIS — F17200 Nicotine dependence, unspecified, uncomplicated: Secondary | ICD-10-CM | POA: Diagnosis not present

## 2015-11-03 DIAGNOSIS — S99921A Unspecified injury of right foot, initial encounter: Secondary | ICD-10-CM | POA: Diagnosis present

## 2015-11-03 DIAGNOSIS — Z7982 Long term (current) use of aspirin: Secondary | ICD-10-CM | POA: Diagnosis not present

## 2015-11-03 DIAGNOSIS — Z79899 Other long term (current) drug therapy: Secondary | ICD-10-CM | POA: Insufficient documentation

## 2015-11-03 DIAGNOSIS — G629 Polyneuropathy, unspecified: Secondary | ICD-10-CM | POA: Insufficient documentation

## 2015-11-03 DIAGNOSIS — Y9389 Activity, other specified: Secondary | ICD-10-CM | POA: Insufficient documentation

## 2015-11-03 DIAGNOSIS — S9781XA Crushing injury of right foot, initial encounter: Secondary | ICD-10-CM | POA: Insufficient documentation

## 2015-11-03 MED ORDER — HYDROCODONE-ACETAMINOPHEN 5-325 MG PO TABS
1.0000 | ORAL_TABLET | ORAL | Status: DC | PRN
Start: 2015-11-03 — End: 2016-01-31

## 2015-11-03 MED ORDER — HYDROCODONE-ACETAMINOPHEN 5-325 MG PO TABS
1.0000 | ORAL_TABLET | Freq: Once | ORAL | Status: AC
  Administered 2015-11-03: 1 via ORAL
  Filled 2015-11-03: qty 1

## 2015-11-03 NOTE — Discharge Instructions (Signed)
Ice and elevate as needed for pain or swelling. Use Ace wrap for padding and extra support and protection. Usual crutches at home for walking. Norco as needed for pain. You need follow-up with your regular doctor if any continued problems or call and make an appointment with [name] at [facility].
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
## 2016-01-31 ENCOUNTER — Encounter (HOSPITAL_COMMUNITY): Payer: Self-pay | Admitting: Emergency Medicine

## 2016-01-31 ENCOUNTER — Emergency Department (HOSPITAL_COMMUNITY)
Admission: EM | Admit: 2016-01-31 | Discharge: 2016-01-31 | Disposition: A | Discharge status: Home / Self Care | Payer: Medicare Other | Attending: Emergency Medicine | Admitting: Emergency Medicine

## 2016-01-31 DIAGNOSIS — Z794 Long term (current) use of insulin: Secondary | ICD-10-CM | POA: Insufficient documentation

## 2016-01-31 DIAGNOSIS — Z9114 Patient's other noncompliance with medication regimen: Secondary | ICD-10-CM

## 2016-01-31 DIAGNOSIS — F1410 Cocaine abuse, uncomplicated: Secondary | ICD-10-CM | POA: Diagnosis not present

## 2016-01-31 DIAGNOSIS — I10 Essential (primary) hypertension: Secondary | ICD-10-CM | POA: Diagnosis not present

## 2016-01-31 DIAGNOSIS — Z7982 Long term (current) use of aspirin: Secondary | ICD-10-CM | POA: Insufficient documentation

## 2016-01-31 DIAGNOSIS — I509 Heart failure, unspecified: Secondary | ICD-10-CM | POA: Diagnosis not present

## 2016-01-31 DIAGNOSIS — G629 Polyneuropathy, unspecified: Secondary | ICD-10-CM | POA: Diagnosis not present

## 2016-01-31 DIAGNOSIS — Z9861 Coronary angioplasty status: Secondary | ICD-10-CM | POA: Diagnosis not present

## 2016-01-31 DIAGNOSIS — E1165 Type 2 diabetes mellitus with hyperglycemia: Secondary | ICD-10-CM | POA: Insufficient documentation

## 2016-01-31 DIAGNOSIS — R739 Hyperglycemia, unspecified: Secondary | ICD-10-CM

## 2016-01-31 DIAGNOSIS — I2510 Atherosclerotic heart disease of native coronary artery without angina pectoris: Secondary | ICD-10-CM | POA: Diagnosis not present

## 2016-01-31 DIAGNOSIS — F1110 Opioid abuse, uncomplicated: Secondary | ICD-10-CM | POA: Insufficient documentation

## 2016-01-31 DIAGNOSIS — F17210 Nicotine dependence, cigarettes, uncomplicated: Secondary | ICD-10-CM | POA: Insufficient documentation

## 2016-01-31 DIAGNOSIS — Z9119 Patient's noncompliance with other medical treatment and regimen: Secondary | ICD-10-CM | POA: Diagnosis not present

## 2016-01-31 DIAGNOSIS — G8929 Other chronic pain: Secondary | ICD-10-CM | POA: Diagnosis not present

## 2016-01-31 LAB — CBC
HCT: 40.8 % (ref 39.0–52.0)
HEMOGLOBIN: 13.8 g/dL (ref 13.0–17.0)
MCH: 29.1 pg (ref 26.0–34.0)
MCHC: 33.8 g/dL (ref 30.0–36.0)
MCV: 85.9 fL (ref 78.0–100.0)
Platelets: 284 10*3/uL (ref 150–400)
RBC: 4.75 MIL/uL (ref 4.22–5.81)
RDW: 12.4 % (ref 11.5–15.5)
WBC: 6.1 10*3/uL (ref 4.0–10.5)

## 2016-01-31 LAB — URINE MICROSCOPIC-ADD ON
BACTERIA UA: NONE SEEN
RBC / HPF: NONE SEEN RBC/hpf (ref 0–5)
SQUAMOUS EPITHELIAL / LPF: NONE SEEN
Urine-Other: NONE SEEN
WBC, UA: NONE SEEN WBC/hpf (ref 0–5)

## 2016-01-31 LAB — BASIC METABOLIC PANEL
ANION GAP: 8 (ref 5–15)
BUN: 15 mg/dL (ref 6–20)
CALCIUM: 9.2 mg/dL (ref 8.9–10.3)
CO2: 25 mmol/L (ref 22–32)
Chloride: 99 mmol/L — ABNORMAL LOW (ref 101–111)
Creatinine, Ser: 1.03 mg/dL (ref 0.61–1.24)
Glucose, Bld: 583 mg/dL (ref 65–99)
Potassium: 4.3 mmol/L (ref 3.5–5.1)
Sodium: 132 mmol/L — ABNORMAL LOW (ref 135–145)

## 2016-01-31 LAB — CBG MONITORING, ED

## 2016-01-31 LAB — URINALYSIS, ROUTINE W REFLEX MICROSCOPIC
BILIRUBIN URINE: NEGATIVE
Glucose, UA: >1000 mg/dL — AB
Hgb urine dipstick: NEGATIVE
Ketones, ur: NEGATIVE mg/dL
Leukocytes, UA: NEGATIVE
NITRITE: NEGATIVE
PROTEIN: NEGATIVE mg/dL
SPECIFIC GRAVITY, URINE: 1.039 — AB (ref 1.005–1.030)
pH: 5.5 (ref 5.0–8.0)

## 2016-01-31 MED ORDER — GABAPENTIN 600 MG PO TABS: 600.0000 mg | ORAL_TABLET | Freq: Three times a day (TID) | ORAL | Status: DC

## 2016-01-31 MED ORDER — ONDANSETRON HCL 4 MG/2ML IJ SOLN
4.0000 mg | Freq: Once | INTRAMUSCULAR | Status: AC
  Administered 2016-01-31: 4 mg via INTRAVENOUS
  Filled 2016-01-31: qty 2

## 2016-01-31 MED ORDER — INSULIN GLARGINE 100 UNIT/ML ~~LOC~~ SOLN: 20.0000 [IU] | Freq: Every day | SUBCUTANEOUS | Status: DC

## 2016-01-31 MED ORDER — SODIUM CHLORIDE 0.9 % IV SOLN
1000.0000 mL | Freq: Once | INTRAVENOUS | Status: AC
  Administered 2016-01-31: 1000 mL via INTRAVENOUS

## 2016-01-31 MED ORDER — METOPROLOL TARTRATE 25 MG PO TABS: 12.5000 mg | ORAL_TABLET | Freq: Two times a day (BID) | ORAL | Status: DC

## 2016-01-31 MED ORDER — SODIUM CHLORIDE 0.9 % IV SOLN
1000.0000 mL | INTRAVENOUS | Status: DC
Start: 2016-01-31 — End: 2016-01-31

## 2016-01-31 MED ORDER — INSULIN REGULAR HUMAN 100 UNIT/ML IJ SOLN: 8.0000 [IU] | Freq: Three times a day (TID) | INTRAMUSCULAR | Status: DC

## 2016-01-31 MED ORDER — NEEDLES & SYRINGES MISC: Status: DC

## 2016-01-31 MED ORDER — PRAVASTATIN SODIUM 80 MG PO TABS: 80.0000 mg | ORAL_TABLET | Freq: Every day | ORAL | Status: DC

## 2016-01-31 MED ORDER — ASPIRIN EC 81 MG PO TBEC: 81.0000 mg | DELAYED_RELEASE_TABLET | Freq: Every day | ORAL | Status: DC

## 2016-01-31 NOTE — Discharge Instructions (Signed)
Hyperglycemia °Hyperglycemia occurs when the glucose (sugar) in your blood is too high. Hyperglycemia can happen for many reasons, but it most often happens to people who do not know they have diabetes or are not managing their diabetes properly.  °CAUSES  °Whether you have diabetes or not, there are other causes of hyperglycemia. Hyperglycemia can occur when you have diabetes, but it can also occur in other situations that you might not be as aware of, such as: °Diabetes °· If you have diabetes and are having problems controlling your blood glucose, hyperglycemia could occur because of some of the following reasons: °¨ Not following your meal plan. °¨ Not taking your diabetes medications or not taking it properly. °¨ Exercising less or doing less activity than you normally do. °¨ Being sick. °Pre-diabetes °· This cannot be ignored. Before people develop Type 2 diabetes, they almost always have "pre-diabetes." This is when your blood glucose levels are higher than normal, but not yet high enough to be diagnosed as diabetes. Research has shown that some long-term damage to the body, especially the heart and circulatory system, may already be occurring during pre-diabetes. If you take action to manage your blood glucose when you have pre-diabetes, you may delay or prevent Type 2 diabetes from developing. °Stress °· If you have diabetes, you may be "diet" controlled or on oral medications or insulin to control your diabetes. However, you may find that your blood glucose is higher than usual in the hospital whether you have diabetes or not. This is often referred to as "stress hyperglycemia." Stress can elevate your blood glucose. This happens because of hormones put out by the body during times of stress. If stress has been the cause of your high blood glucose, it can be followed regularly by your caregiver. That way he/she can make sure your hyperglycemia does not continue to get worse or progress to  diabetes. °Steroids °· Steroids are medications that act on the infection fighting system (immune system) to block inflammation or infection. One side effect can be a rise in blood glucose. Most people can produce enough extra insulin to allow for this rise, but for those who cannot, steroids make blood glucose levels go even higher. It is not unusual for steroid treatments to "uncover" diabetes that is developing. It is not always possible to determine if the hyperglycemia will go away after the steroids are stopped. A special blood test called an A1c is sometimes done to determine if your blood glucose was elevated before the steroids were started. °SYMPTOMS °· Thirsty. °· Frequent urination. °· Dry mouth. °· Blurred vision. °· Tired or fatigue. °· Weakness. °· Sleepy. °· Tingling in feet or leg. °DIAGNOSIS  °Diagnosis is made by monitoring blood glucose in one or all of the following ways: °· A1c test. This is a chemical found in your blood. °· Fingerstick blood glucose monitoring. °· Laboratory results. °TREATMENT  °First, knowing the cause of the hyperglycemia is important before the hyperglycemia can be treated. Treatment may include, but is not be limited to: °· Education. °· Change or adjustment in medications. °· Change or adjustment in meal plan. °· Treatment for an illness, infection, etc. °· More frequent blood glucose monitoring. °· Change in exercise plan. °· Decreasing or stopping steroids. °· Lifestyle changes. °HOME CARE INSTRUCTIONS  °· Test your blood glucose as directed. °· Exercise regularly. Your caregiver will give you instructions about exercise. Pre-diabetes or diabetes which comes on with stress is helped by exercising. °· Eat wholesome,   balanced meals. Eat often and at regular, fixed times. Your caregiver or nutritionist will give you a meal plan to guide your sugar intake.  Being at an ideal weight is important. If needed, losing as little as 10 to 15 pounds may help improve blood  glucose levels. SEEK MEDICAL CARE IF:   You have questions about medicine, activity, or diet.  You continue to have symptoms (problems such as increased thirst, urination, or weight gain). SEEK IMMEDIATE MEDICAL CARE IF:   You are vomiting or have diarrhea.  Your breath smells fruity.  You are breathing faster or slower.  You are very sleepy or incoherent.  You have numbness, tingling, or pain in your feet or hands.  You have chest pain.  Your symptoms get worse even though you have been following your caregiver's orders.  If you have any other questions or concerns.   This information is not intended to replace advice given to you by your health care provider. Make sure you discuss any questions you have with your health care provider.   Document Released: 04/03/2001 Document Revised: 12/31/2011 Document Reviewed: 06/14/2015 Elsevier Interactive Patient Education 2016 [facility] The [employer] 211 is a great source of information about community services available.  Access by dialing 2-1-1 from anywhere in [place], or by website -  CustodianSupply.fi.   Other Local Resources (Updated 10/2015)  [employer]  Crisis Hotline, available 24 hours a day, 7 days a week: [place], [place]   [facility]  Crisis Hotline, available 24 hours a day, 7 days a week: [place], [place]  [facility]  Suicide Prevention Hotline, available 24 hours a day, 7 days a week: [place], [place], available 24 hours a day, 7 days a week: [place], [place] Access to [employer], available 24 hours a day, 7 days a week: (605)497-6507 All   [facility]  Crisis Hotline, available 24 hours a day, 7  days a week: [phone] All   Other Local Resources (Updated 10/2015)  Outpatient Counseling/ Substance Abuse Programs  Services     Address and Phone Number  ADS ([facility]cohol and Drug Services)   Options include Individual counseling, group counseling, intensive outpatient program (several hours a day, several days a week)  Offers depression assessments  Provides methadone maintenance program [phone] [address] [place], [place] partial hospitalization/day treatment and DUI/DWI programs  [employer], private insurance [phone] [phone] [place], [place] [address]  [place] include intensive outpatient program (several hours a day, several days a week), outpatient treatment, DUI/DWI services, family education  Also has some services specifically for [employer] transitional housing  [phone] [address] [place], [place] [address]     [place] Medicare, private pay, and private insurance [phone] [address], [place], [place] [address]  [facility]  Services include individual counseling, substance abuse intensive outpatient program (several hours a day, several days a week), day treatment  Blinda Leatherwood, Medicaid, private insurance [phone] [name], [place], [place] [address]  [facility]   Offers substance abuse intensive  outpatient program (several hours a day, several days a week), partial hospitalization program [phone] [address] [place], [place] [address]  [phone] [address], [place] [address]  [phone] [place], [place] [address]  [phone], [place], [place] [address]  [facility]  Individual counseling only  Accepts  private insurance only [phone] [address], [place], [place] [address]  [facility]adone Clinic  Methadone maintenance program [phone] [place], [place] [address]  [facility] providing substance abuse and mental health counseling  Accepts Medicaid, Medicare, private insurance  Offers sliding scale for uninsured [phone] [place], [place] in [place] individual counseling, and intensive in-home services [phone] [address] [place], [place] [address]  [facility] individual counseling, family counseling, group therapy, domestic violence counseling, consumer credit counseling  Accepts Medicare, Medicaid, private insurance  Offers sliding scale for uninsured [phone] [address] [place], [place] [address]  [phone] [facility], [address] [place], [place]  [facility]  Offers individual, family and group counseling  3 locations - [place], [place], and [place]  [place] [address] [place], [place] [address]  [address] [place], [place] [address]  [place], [place] [address]  [facility] [place] [facility]    Offers psychiatric assessment, 8-week Intensive Outpatient Program (several hours a day, several times a week, daytime or evenings), early recovery group, family Program, medication management  Private pay or private insurance only [phone], or  [phone] [address] [place], [place] [address]  [facility]  Offers individual, couples and family counseling  Accepts Medicaid, private insurance, and sliding scale for uninsured [phone] [address] [place], [place] [address]  Launa Flight, MD  Individual counseling  Private insurance [phone] [place], [place] [address]  [facility]    Offers assessment, substance abuse treatment, and behavioral health treatment [phone] [address] [place], [place] [address]  [place]  Individual counseling  Accepts private insurance [phone] [place], [place] [address]  [facility]  Individual counseling  Blinda Leatherwood, private insurance [phone] [place], [place] [address]  [place]    Offers intensive outpatient program (several hours a day, several times a week)  Private pay, private insurance [phone] [place], [place]  Individual counseling  Medicare, private insurance [phone] [address], [place], [place] [address]  [place]    Offers intensive outpatient program (several hours a day, several times a week) and partial hospitalization program [phone] [place], [place] [address]  Letta Moynahan, MD  Individual counseling [phone] [address] [place], [place] [address]  [place] counseling to individuals, couples, and families  Accepts Medicare and private insurance; offers sliding scale for uninsured [phone] [place], [place] [address]  [facility]  Christian counseling [phone] [address] [place],  [address]  [facility] [employer] crisis counseling, individual counseling, group therapy, in-home therapy, domestic violence services, day treatment, DWI services, Conservation officer, nature (CST), Actuary (ACTT), substance abuse Intensive Outpatient Program (several hours a day, several times a week)  2 locations - [place] and [place] [phone]  [place], [place] [address]  [phone] [address] [place], [place] [address]  [place] counseling and group therapy  Accepts private insurance, [place], [place] [phone] [address] [place], [place]  [facility]  Offers individual and family counseling  Offers LGBTQ services  Accepts private insurance and private pay [phone] [place], [place] [address]  [facility]    Offers individual counseling, group therapy, and outpatient detox  Accepts private insurance [phone] [place], [place] Medicare, private insurance [phone] [address] [place], [place] [address]  [employer]  Individual counseling  Accepts Medicare, private insurance [phone] [address], [place] [address]  [facility] [place] substance abuse Intensive Outpatient Program (several hours a day, several times a week) [phone], or [phone] [place], [place]

## 2016-01-31 NOTE — ED Notes (Signed)
Pt called states that she received a call from the pt stating that he is being discharged home, she is mad because "they said that he was going to be there for 2 days.  46 hours."  Explained to her that the doctor felt like the pt is stable to be discharged home, and that pt has to meet requirements to be admitted to the hospital.  She states that she is on her way.

## 2016-01-31 NOTE — ED Notes (Signed)
Pt reports he checked in today because he went to [facility] for cocaine and heroin abuse and was instructed to come here for medical clearance.  He also reports that he is diabetic and has hx of cardiac problems.  States he hasn't checked his blood sugar in a long time and has not used insulin.  Feels nauseous but denies any vomiting at this time.  Pt's sister-in-law is at bedside.  Pt also reports chronic back and leg pain.
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
## 2016-01-31 NOTE — ED Provider Notes (Signed)
CSN: [id number]     Arrival date & time 01/31/16  1320 History   First MD Initiated Contact with Patient 01/31/16 1352     Chief Complaint  Patient presents with  . Hyperglycemia     HPI Patient presents the emergency department medication noncompliance of the past several months and presents complaining of cocaine and heroin abuse.  He states that his last heroin use was yesterday.  He's been off his medications and presents hyperglycemic with a blood sugar greater than 600.  He reports generalized fatigue and weakness.  He denies chest pain shortness breath.  No abdominal pain.  Denies nausea vomiting or diarrhea.  He has no homicidal or suicidal thoughts.   Past Medical History  Diagnosis Date  . Hypertension   . Diabetes mellitus without complication ([place])   . Coronary artery disease   . Neuropathy ([place])   . Chronic back pain   . Chronic leg pain   . CHF (congestive heart failure) [facility])    Past Surgical History  Procedure Laterality Date  . Coronary angioplasty with stent placement     Family History  Problem Relation Age of Onset  . CAD Mother   . Diabetes Mellitus II Father    Social History  Substance Use Topics  . Smoking status: Current Every Day Smoker -- 0.50 packs/day  . Smokeless tobacco: Not on file  . Alcohol Use: No    Review of Systems  All other systems reviewed and are negative.     Allergies  Review of patient's allergies indicates no known allergies.  Home Medications   Prior to Admission medications   Medication Sig Start Date End Date Taking? Authorizing Provider  aspirin EC 81 MG tablet Take 1 tablet (81 mg total) by mouth daily. Patient not taking: Reported on 01/31/2016 08/11/15   Loletha Grayer, MD  gabapentin (NEURONTIN) 600 MG tablet Take 1 tablet (600 mg total) by mouth 3 (three) times daily. Patient not taking: Reported on 01/31/2016 09/25/15 09/24/16  Orbie Pyo, MD  HYDROcodone-acetaminophen (NORCO/VICODIN) 5-325 MG  tablet Take 1 tablet by mouth every 4 (four) hours as needed for moderate pain. Patient not taking: Reported on 01/31/2016 11/03/15   Johnn Hai, PA-C  insulin glargine (LANTUS) 100 UNIT/ML injection Inject 0.2 mLs (20 Units total) into the skin at bedtime. Sliding scale. Max out at 50 units Patient not taking: Reported on 01/31/2016 08/11/15   Loletha Grayer, MD  insulin regular (NOVOLIN R,HUMULIN R) 100 units/mL injection Inject 0.08 mLs (8 Units total) into the skin 3 (three) times daily before meals. Patient not taking: Reported on 01/31/2016 08/11/15   Loletha Grayer, MD  metoprolol tartrate (LOPRESSOR) 25 MG tablet Take 0.5 tablets (12.5 mg total) by mouth 2 (two) times daily. Patient not taking: Reported on 01/31/2016 08/11/15   Loletha Grayer, MD  nicotine (NICODERM CQ - DOSED IN MG/24 HOURS) 14 mg/24hr patch Place 1 patch (14 mg total) onto the skin daily. Patient not taking: Reported on 01/31/2016 08/11/15   Loletha Grayer, MD  pravastatin (PRAVACHOL) 80 MG tablet Take 1 tablet (80 mg total) by mouth at bedtime. Patient not taking: Reported on 01/31/2016 08/11/15   Loletha Grayer, MD   BP 121/74 mmHg  Pulse 92  Temp(Src) 98 F (36.7 C) (Oral)  Resp 18  SpO2 96% Physical Exam  Constitutional: He is oriented to person, place, and time. He appears well-developed and well-nourished.  HENT:  Head: Normocephalic and atraumatic.  Eyes: EOM are normal.  Neck:  Normal range of motion.  Cardiovascular: Normal rate, regular rhythm, normal heart sounds and intact distal pulses.   Pulmonary/Chest: Effort normal and breath sounds normal. No respiratory distress.  Abdominal: Soft. He exhibits no distension. There is no tenderness.  Musculoskeletal: Normal range of motion.  Neurological: He is alert and oriented to person, place, and time.  Skin: Skin is warm and dry.  Psychiatric: He has a normal mood and affect. Judgment normal.  Nursing note and vitals reviewed.   ED Course   Procedures (including critical care time) Labs Review Labs Reviewed  BASIC METABOLIC PANEL - Abnormal; Notable for the following:    Sodium 132 (*)    Chloride 99 (*)    Glucose, Bld 583 (*)    All other components within normal limits  CBG MONITORING, ED - Abnormal; Notable for the following:    Glucose-Capillary >600 (*)    All other components within normal limits  CBC  URINALYSIS, ROUTINE W REFLEX MICROSCOPIC (NOT AT [facility])  CBG MONITORING, ED    Imaging Review No results found. I have personally reviewed and evaluated these images and lab results as part of my medical decision-making.   EKG Interpretation None      MDM   Final diagnoses:  Hyperglycemia  H/O medication noncompliance  Heroin abuse    Hyperglycemia without DKA.  Medication noncompliance.  Hydrated in the emergency department.  Patient with history of substance abuse.  Outpatient resources given for cocaine and heroin abuse.  Discharge home in good condition.  Prescriptions for his medications were written for.  He'll need follow-up with his primary care physician.    Jola Schmidt, MD 01/31/16 [phone]

## 2016-01-31 NOTE — ED Notes (Addendum)
Per pt, states diabetic, heart conditions and cocaine and heroin use-has not had meds in months-last use of drugs yesterday

## 2016-01-31 NOTE — Progress Notes (Signed)
Pt resting in bed when Cm assessed him and fiance at bedside on the telephone She got off the cell phone  Pt did not address Cm but Fiance confirmed pt without pcp  Cm discussed the importance of f/u medical care Fiance asked to speak with CM outside of the room & began to informed Cm that she, her 62 yr old and the pt lived together in [place], about to be evicted, pt does not need to go back to [employer] in "the same environment", they had just moved from [place] recently, the pt has a cardiac issue with stents and diabetes The fiance had to leave to go to assist her sister with getting to work The pt asked to speak with fiance without CM.  CM discussed that the pt may be returned ask to seek services in the county he resides, Advertising copywriter stated she was informed pt would get resources Cm inquired if the pt or she had support in [employer] where pt could get services and care Fiance informed Cm they did not have family nor support in [place] returned to pt room after noted pt up for d/c Pt is not sitting on side of his bed smiling and speaking with someone on his cell phone Pt again would not address Cm Pt informed person on the phone "don't holler at me.  Holler at the doctor and nurses. I don't know what they told you"   Cm left a list of [place] providers for d/u care and a list of [place] crisis resources to assist with bills, housing , shelter, food, DSS, bill assistance and [facility] services

## 2016-12-07 ENCOUNTER — Emergency Department: Payer: Medicare (Managed Care)

## 2016-12-07 ENCOUNTER — Emergency Department
Admission: EM | Admit: 2016-12-07 | Discharge: 2016-12-07 | Disposition: A | Discharge status: Home / Self Care | Payer: Medicare (Managed Care) | Attending: Student in an Organized Health Care Education/Training Program | Admitting: Student in an Organized Health Care Education/Training Program

## 2016-12-07 ENCOUNTER — Encounter: Payer: Self-pay | Admitting: Emergency Medicine

## 2016-12-07 DIAGNOSIS — I509 Heart failure, unspecified: Secondary | ICD-10-CM | POA: Insufficient documentation

## 2016-12-07 DIAGNOSIS — Z794 Long term (current) use of insulin: Secondary | ICD-10-CM | POA: Insufficient documentation

## 2016-12-07 DIAGNOSIS — R1030 Lower abdominal pain, unspecified: Secondary | ICD-10-CM

## 2016-12-07 DIAGNOSIS — I2510 Atherosclerotic heart disease of native coronary artery without angina pectoris: Secondary | ICD-10-CM | POA: Insufficient documentation

## 2016-12-07 DIAGNOSIS — I110 Hypertensive heart disease with heart failure: Secondary | ICD-10-CM | POA: Insufficient documentation

## 2016-12-07 DIAGNOSIS — N433 Hydrocele, unspecified: Secondary | ICD-10-CM | POA: Insufficient documentation

## 2016-12-07 DIAGNOSIS — M79605 Pain in left leg: Secondary | ICD-10-CM

## 2016-12-07 DIAGNOSIS — M79604 Pain in right leg: Secondary | ICD-10-CM

## 2016-12-07 DIAGNOSIS — R1032 Left lower quadrant pain: Secondary | ICD-10-CM | POA: Diagnosis present

## 2016-12-07 DIAGNOSIS — F17200 Nicotine dependence, unspecified, uncomplicated: Secondary | ICD-10-CM | POA: Diagnosis not present

## 2016-12-07 DIAGNOSIS — R52 Pain, unspecified: Secondary | ICD-10-CM

## 2016-12-07 DIAGNOSIS — E1165 Type 2 diabetes mellitus with hyperglycemia: Secondary | ICD-10-CM | POA: Insufficient documentation

## 2016-12-07 DIAGNOSIS — R739 Hyperglycemia, unspecified: Secondary | ICD-10-CM

## 2016-12-07 DIAGNOSIS — Z79899 Other long term (current) drug therapy: Secondary | ICD-10-CM | POA: Insufficient documentation

## 2016-12-07 LAB — URINALYSIS, COMPLETE (UACMP) WITH MICROSCOPIC
Bacteria, UA: NONE SEEN
Bilirubin Urine: NEGATIVE
Hgb urine dipstick: NEGATIVE
Ketones, ur: NEGATIVE mg/dL
Leukocytes, UA: NEGATIVE
Nitrite: NEGATIVE
PH: 5 (ref 5.0–8.0)
PROTEIN: NEGATIVE mg/dL

## 2016-12-07 LAB — BASIC METABOLIC PANEL
Anion gap: 10 (ref 5–15)
BUN: 21 mg/dL — ABNORMAL HIGH (ref 6–20)
CALCIUM: 9.2 mg/dL (ref 8.9–10.3)
CO2: 25 mmol/L (ref 22–32)
CREATININE: 0.8 mg/dL (ref 0.61–1.24)
Chloride: 101 mmol/L (ref 101–111)
GFR calc Af Amer: >60 mL/min (ref >60)
GLUCOSE: 278 mg/dL — AB (ref 65–99)
Potassium: 3.7 mmol/L (ref 3.5–5.1)
Sodium: 136 mmol/L (ref 135–145)

## 2016-12-07 LAB — GLUCOSE, CAPILLARY: Glucose-Capillary: 257 mg/dL — ABNORMAL HIGH (ref 65–99)

## 2016-12-07 LAB — CBC
HCT: 41.4 % (ref 40.0–52.0)
Hemoglobin: 13.7 g/dL (ref 13.0–18.0)
MCH: 29.1 pg (ref 26.0–34.0)
MCHC: 33 g/dL (ref 32.0–36.0)
MCV: 88.4 fL (ref 80.0–100.0)
PLATELETS: 248 10*3/uL (ref 150–440)
RBC: 4.69 MIL/uL (ref 4.40–5.90)
RDW: 12.9 % (ref 11.5–14.5)
WBC: 7.1 10*3/uL (ref 3.8–10.6)

## 2016-12-07 MED ORDER — CYCLOBENZAPRINE HCL 10 MG PO TABS: 10.0000 mg | ORAL_TABLET | Freq: Three times a day (TID) | ORAL | 0 refills | Status: DC | PRN

## 2016-12-07 MED ORDER — HYDROCODONE-ACETAMINOPHEN 5-325 MG PO TABS: 1.0000 | ORAL_TABLET | ORAL | 0 refills | Status: DC | PRN

## 2016-12-07 MED ORDER — MORPHINE SULFATE (PF) 4 MG/ML IV SOLN
4.0000 mg | Freq: Once | INTRAVENOUS | Status: AC
  Administered 2016-12-07: 4 mg via INTRAVENOUS
  Filled 2016-12-07: qty 1

## 2016-12-07 MED ORDER — GABAPENTIN 100 MG PO CAPS
200.0000 mg | ORAL_CAPSULE | Freq: Once | ORAL | Status: AC
  Administered 2016-12-07: 200 mg via ORAL
  Filled 2016-12-07: qty 2

## 2016-12-07 MED ORDER — IOPAMIDOL (ISOVUE-300) INJECTION 61%
100.0000 mL | Freq: Once | INTRAVENOUS | Status: AC | PRN
  Administered 2016-12-07: 100 mL via INTRAVENOUS

## 2016-12-07 MED ORDER — METOCLOPRAMIDE HCL 5 MG/ML IJ SOLN
10.0000 mg | Freq: Once | INTRAMUSCULAR | Status: AC
  Administered 2016-12-07: 10 mg via INTRAVENOUS
  Filled 2016-12-07: qty 2

## 2016-12-07 MED ORDER — SODIUM CHLORIDE 0.9 % IV BOLUS (SEPSIS)
1000.0000 mL | Freq: Once | INTRAVENOUS | Status: AC
  Administered 2016-12-07: 1000 mL via INTRAVENOUS

## 2016-12-07 MED ORDER — MICONAZOLE NITRATE 2 % EX POWD: Freq: Two times a day (BID) | CUTANEOUS | 0 refills | Status: AC

## 2016-12-07 NOTE — ED Triage Notes (Addendum)
Pt with bilateral groin pain for over a week radiating down into both legs. Pt states his testicles are swollen and sore. Also reports this blood sugar has been high.

## 2016-12-07 NOTE — ED Notes (Signed)
Signature pad not working.  Patient verbalized understanding of discharge paperwork and has no further questions. 

## 2016-12-07 NOTE — ED Provider Notes (Signed)
[facility] Emergency Department Provider Note    None    (approximate)  I have reviewed the triage vital signs and the nursing notes.   HISTORY  Chief Complaint Groin Pain and Hyperglycemia    HPI Richard [name] is a 61 y.o. male w/ h/o dm, chf and neuropathy presents with c/c of bilateral groin and leg throbbing and burning pain x 3 weeks.  No trauma.  States his testicles are swollen.  No recent fevers, no CPO or SOB.  No new numbness or tingling.  Just recently moved to the area.  He denies any acute worsening of the symptoms. States he can ER today because he did not want to get worse in any event deferring being seen by a doctor for several weeks.   Past Medical History:  Diagnosis Date  . CHF (congestive heart failure) ([place])   . Chronic back pain   . Chronic leg pain   . Coronary artery disease   . Diabetes mellitus without complication ([place])   . Hypertension   . Neuropathy ([place])    Family History  Problem Relation Age of Onset  . CAD Mother   . Diabetes Mellitus II Father    Past Surgical History:  Procedure Laterality Date  . CORONARY ANGIOPLASTY WITH STENT PLACEMENT     Patient Active Problem List   Diagnosis Date Noted  . Chest pain 08/10/2015      Prior to Admission medications   Medication Sig Start Date End Date Taking? Authorizing Provider  aspirin EC 81 MG tablet Take 1 tablet (81 mg total) by mouth daily. 01/31/16  Yes Jola Schmidt, MD  gabapentin (NEURONTIN) 600 MG tablet Take 1 tablet (600 mg total) by mouth 3 (three) times daily. 01/31/16 01/30/17 Yes Jola Schmidt, MD  insulin glargine (LANTUS) 100 UNIT/ML injection Inject 0.2 mLs (20 Units total) into the skin at bedtime. Sliding scale. Max out at 50 units Patient taking differently: Inject 30 Units into the skin 2 (two) times daily. Sliding scale. Max out at 50 units 01/31/16  Yes Jola Schmidt, MD  metFORMIN (GLUMETZA) 500 MG (MOD) 24 hr tablet Take 1,000 mg by mouth 3  (three) times daily. 10/30/16  Yes Historical Provider, MD  pravastatin (PRAVACHOL) 80 MG tablet Take 1 tablet (80 mg total) by mouth at bedtime. 01/31/16  Yes Jola Schmidt, MD  cyclobenzaprine (FLEXERIL) 10 MG tablet Take 1 tablet (10 mg total) by mouth 3 (three) times daily as needed for muscle spasms. 12/07/16   Merlyn Lot, MD  HYDROcodone-acetaminophen (NORCO) 5-325 MG tablet Take 1 tablet by mouth every 4 (four) hours as needed for moderate pain. 12/07/16   Merlyn Lot, MD  insulin regular (NOVOLIN R,HUMULIN R) 100 units/mL injection Inject 0.08 mLs (8 Units total) into the skin 3 (three) times daily before meals. Patient not taking: Reported on 12/07/2016 01/31/16   Jola Schmidt, MD  metoprolol tartrate (LOPRESSOR) 25 MG tablet Take 0.5 tablets (12.5 mg total) by mouth 2 (two) times daily. 01/31/16   Jola Schmidt, MD  miconazole (LOTRIMIN AF) 2 % powder Apply topically 2 (two) times daily. 12/07/16 12/21/16  Merlyn Lot, MD  Needles & Syringes MISC Use as directed 01/31/16   Jola Schmidt, MD    Allergies Patient has no known allergies.    Social History Social History  Substance Use Topics  . Smoking status: Current Every Day Smoker    Packs/day: 0.50  . Smokeless tobacco: Never Used  . Alcohol use No  Review of Systems Patient denies headaches, rhinorrhea, blurry vision, numbness, shortness of breath, chest pain, edema, cough, abdominal pain, nausea, vomiting, diarrhea, dysuria, fevers, rashes or hallucinations unless otherwise stated above in HPI. ____________________________________________   PHYSICAL EXAM:  VITAL SIGNS: Vitals:   12/07/16 1319 12/07/16 1324  BP:  118/73  Pulse:  99  Resp:  20  Temp: 98 F (36.7 C)     Constitutional: Alert and oriented. Well appearing and in no acute distress. Eyes: Conjunctivae are normal. PERRL. EOMI. Head: Atraumatic. Nose: No congestion/rhinnorhea. Mouth/Throat: Mucous membranes are moist.  Oropharynx  non-erythematous. Neck: No stridor. Painless ROM. No cervical spine tenderness to palpation Hematological/Lymphatic/Immunilogical: No cervical lymphadenopathy. Cardiovascular: Normal rate, regular rhythm. Grossly normal heart sounds.  Good peripheral circulation. Respiratory: Normal respiratory effort.  No retractions. Lungs CTAB. Gastrointestinal: Soft and nontender. No distention. No abdominal bruits. No CVA tenderness. Genitourinary: erythematous and tender scrotum, no testicular pain, + cremasteric reflex, no crepitus, warmth or induration to the perineum  Musculoskeletal: No lower extremity tenderness nor edema.  No joint effusions.  2+ pulses bilaterally, SILT distally Neurologic:  Normal speech and language. No gross focal neurologic deficits are appreciated. No gait instability. Skin:  Skin is warm, dry and intact. No rash noted. Psychiatric: Mood and affect are normal. Speech and behavior are normal.  ____________________________________________   LABS (all labs ordered are listed, but only abnormal results are displayed)  Results for orders placed or performed during the hospital encounter of 12/07/16 (from the past 24 hour(s))  Basic metabolic panel     Status: Abnormal   Collection Time: 12/07/16  1:25 PM  Result Value Ref Range   Sodium 136 135 - 145 mmol/L   Potassium 3.7 3.5 - 5.1 mmol/L   Chloride 101 101 - 111 mmol/L   CO2 25 22 - 32 mmol/L   Glucose, Bld 278 (H) 65 - 99 mg/dL   BUN 21 (H) 6 - 20 mg/dL   Creatinine, Ser 0.80 0.61 - 1.24 mg/dL   Calcium 9.2 8.9 - 10.3 mg/dL   GFR calc non Af Amer >60 >60 mL/min   GFR calc Af Amer >60 >60 mL/min   Anion gap 10 5 - 15  CBC     Status: None   Collection Time: 12/07/16  1:25 PM  Result Value Ref Range   WBC 7.1 3.8 - 10.6 K/uL   RBC 4.69 4.40 - 5.90 MIL/uL   Hemoglobin 13.7 13.0 - 18.0 g/dL   HCT 41.4 40.0 - 52.0 %   MCV 88.4 80.0 - 100.0 fL   MCH 29.1 26.0 - 34.0 pg   MCHC 33.0 32.0 - 36.0 g/dL   RDW 12.9 11.5 -  14.5 %   Platelets 248 150 - 440 K/uL  Glucose, capillary     Status: Abnormal   Collection Time: 12/07/16  1:28 PM  Result Value Ref Range   Glucose-Capillary 257 (H) 65 - 99 mg/dL   Comment 1 Notify RN   Urinalysis, Complete w Microscopic     Status: Abnormal   Collection Time: 12/07/16  7:03 PM  Result Value Ref Range   Color, Urine YELLOW (A) YELLOW   APPearance CLEAR (A) CLEAR   Specific Gravity, Urine >1.046 (H) 1.005 - 1.030   pH 5.0 5.0 - 8.0   Glucose, UA >=500 (A) NEGATIVE mg/dL   Hgb urine dipstick NEGATIVE NEGATIVE   Bilirubin Urine NEGATIVE NEGATIVE   Ketones, ur NEGATIVE NEGATIVE mg/dL   Protein, ur NEGATIVE NEGATIVE mg/dL  Nitrite NEGATIVE NEGATIVE   Leukocytes, UA NEGATIVE NEGATIVE   RBC / HPF 0-5 0 - 5 RBC/hpf   WBC, UA 0-5 0 - 5 WBC/hpf   Bacteria, UA NONE SEEN NONE SEEN   Squamous Epithelial / LPF 0-5 (A) NONE SEEN   Mucous PRESENT    ____________________________________________  EKG My review and personal interpretation at Time: 16:23   Indication: leg pain  Rate: 70  Rhythm: sinus Axis: normal Other: non specific t wave changes, no STEMI, normal intervals ____________________________________________  RADIOLOGY  I personally reviewed all radiographic images ordered to evaluate for the above acute complaints and reviewed radiology reports and findings.  These findings were personally discussed with the patient.  Please see medical record for radiology report.  ____________________________________________   PROCEDURES  Procedure(s) performed:  Procedures    Critical Care performed: no ____________________________________________   INITIAL IMPRESSION / ASSESSMENT AND PLAN / ED COURSE  Pertinent labs & imaging results that were available during my care of the patient were reviewed by me and considered in my medical decision making (see chart for details).  DDX: dvt, nec fac, fourniers, stone, chf, edema, claudication  Richard [name] is a 61  y.o. who presents to the ED with complaint of several weeks of worsening scrotal pain and bilateral lower extremity pain. Patient afebrile hemodynamically stable. Physical exam as above. Has good cap refill and palpable pulses bilateral lower extremities. Does not appear consistent with acute limits ischemia. His abdominal exam is soft and benign. Based on his history diabetes will order CT scan to evaluate for any evidence of deep or soft tissue infection.  The patient will be placed on continuous pulse oximetry and telemetry for monitoring.  Laboratory evaluation will be sent to evaluate for the above complaints.     Clinical Course as of Dec 07 2002  Fri Dec 07, 2016  1611 Patient with exam is consistent with candidiasis in the groin however does have some perennial tenderness to palpation. No evidence of crepitus or blistering but given the location and his pain in the setting of diabetes will order CT value 844 nares are deep space infection.  [PR]  [id number] CT imaging is reassuring. Based on concern for hydrocele and patient with acute scrotal pain will order ultrasound of the scrotum. Do not feel is consistent with torsion. No evidence of lower extremity DVT. Currently awaiting urinalysis.  [PR]  1938 Urinalysis showed no evidence of infection. There is concentrated urine but the patient is tolerating oral hydration is well-perfused and has no acidosis. There is no evidence of DKA. Discussed with the large component of his leg pain is secondary to worsening diabetic neuropathy. Patient already on Neurontin.  [PR]    Clinical Course User Index [PR] Merlyn Lot, MD     ____________________________________________   FINAL CLINICAL IMPRESSION(S) / ED DIAGNOSES  Final diagnoses:  Discomfort of groin, unspecified laterality  Pain in both lower extremities  Hydrocele in adult  Hyperglycemia      NEW MEDICATIONS STARTED DURING THIS VISIT:  New Prescriptions   CYCLOBENZAPRINE (FLEXERIL)  10 MG TABLET    Take 1 tablet (10 mg total) by mouth 3 (three) times daily as needed for muscle spasms.   HYDROCODONE-ACETAMINOPHEN (NORCO) 5-325 MG TABLET    Take 1 tablet by mouth every 4 (four) hours as needed for moderate pain.   MICONAZOLE (LOTRIMIN AF) 2 % POWDER    Apply topically 2 (two) times daily.     Note:  This document was prepared  using Systems analyst and may include unintentional dictation errors.    Merlyn Lot, MD 12/07/16 [phone]

## 2016-12-07 NOTE — ED Notes (Signed)
Patient transported to Ultrasound 

## 2016-12-25 ENCOUNTER — Encounter: Payer: Self-pay | Admitting: Urology

## 2016-12-25 ENCOUNTER — Ambulatory Visit (INDEPENDENT_AMBULATORY_CARE_PROVIDER_SITE_OTHER): Payer: Medicare (Managed Care) | Admitting: Urology

## 2016-12-25 VITALS — BP 104/70 | HR 102 | Ht 71.0 in | Wt 153.6 lb

## 2016-12-25 DIAGNOSIS — R634 Abnormal weight loss: Secondary | ICD-10-CM

## 2016-12-25 DIAGNOSIS — N433 Hydrocele, unspecified: Secondary | ICD-10-CM

## 2016-12-25 DIAGNOSIS — R102 Pelvic and perineal pain: Secondary | ICD-10-CM | POA: Diagnosis not present

## 2016-12-25 MED ORDER — KETOROLAC TROMETHAMINE 60 MG/2ML IM SOLN
60.0000 mg | Freq: Once | INTRAMUSCULAR | Status: AC
  Administered 2016-12-25: 60 mg via INTRAMUSCULAR

## 2016-12-25 NOTE — Progress Notes (Signed)
12/25/2016 11:10 AM   Richard [name] April 04, 1956 [id number]  Referring provider: No referring provider defined for this encounter.  Chief Complaint  Patient presents with  . New Patient (Initial Visit)    bilateral hydrocele    HPI:   Consultation for pelvic pain. Patient had bilateral groin pain and was seen in the emergency department a few weeks ago. It was associated with throbbing in his legs and burning in his legs and feet for the past few weeks. Urinalysis was clear apart from glucosuria. Creatinine 0.8. CT scan of the abdomen and pelvis was done which was benign. Bladder was not distended. Prostate appeared normal. Kidneys appeared normal. A small amount of hydrocele fluid was commented on by the radiologist and therefore scrotal ultrasound was obtained. Scrotal ultrasound was normal. Typical hydrocele fluid was seen bilaterally. I reviewed the CT scan and the ultrasound images.   He voids with a good flow, but has an intermittent stream, hesitancy and straining to void. He's lost "30" lbs in last few weeks. He has IDDM.   He has severe pain in "testicles" today, but also pain down into the legs. Percocet helped the pain. He missed f/u with his PCP. He has a lot of "burning" pain. No gross hematuria.    No GU hx or surgery. He has troubling getting and maintaining an erection.   No FH PCa.   We discussed the nature r/b/a to ketorolac IM and he elected to proceed.   PMH: Past Medical History:  Diagnosis Date  . CHF (congestive heart failure) ([place])   . Chronic back pain   . Chronic leg pain   . Coronary artery disease   . Diabetes mellitus without complication ([place])   . Hypertension   . Neuropathy [facility])     Surgical History: Past Surgical History:  Procedure Laterality Date  . CORONARY ANGIOPLASTY WITH STENT PLACEMENT      Home Medications:  Allergies as of 12/25/2016   No Known Allergies     Medication List       Accurate as of 12/25/16 11:10 AM. Always use  your most recent med list.          aspirin EC 81 MG tablet Take 1 tablet (81 mg total) by mouth daily.   atorvastatin 40 MG tablet Commonly known as:  LIPITOR   cyclobenzaprine 10 MG tablet Commonly known as:  FLEXERIL Take 1 tablet (10 mg total) by mouth 3 (three) times daily as needed for muscle spasms.   DULoxetine 60 MG capsule Commonly known as:  CYMBALTA   gabapentin 600 MG tablet Commonly known as:  NEURONTIN Take 1 tablet (600 mg total) by mouth 3 (three) times daily.   GLOBAL EASE INJECT PEN NEEDLES 31G X 5 MM Misc Generic drug:  Insulin Pen Needle   HYDROcodone-acetaminophen 5-325 MG tablet Commonly known as:  NORCO Take 1 tablet by mouth every 4 (four) hours as needed for moderate pain.   insulin glargine 100 UNIT/ML injection Commonly known as:  LANTUS Inject 0.2 mLs (20 Units total) into the skin at bedtime. Sliding scale. Max out at 50 units   insulin regular 250 units/2.23mL (100 units/mL) injection Commonly known as:  NOVOLIN R,HUMULIN R Inject 0.08 mLs (8 Units total) into the skin 3 (three) times daily before meals.   metFORMIN 500 MG (MOD) 24 hr tablet Commonly known as:  GLUMETZA Take 1,000 mg by mouth 3 (three) times daily.   metoprolol tartrate 25 MG tablet Commonly known as:  LOPRESSOR Take 0.5 tablets (12.5 mg total) by mouth 2 (two) times daily.   Needles & Syringes Misc Use as directed   omeprazole 20 MG capsule Commonly known as:  PRILOSEC   pravastatin 80 MG tablet Commonly known as:  PRAVACHOL Take 1 tablet (80 mg total) by mouth at bedtime.   traZODone 50 MG tablet Commonly known as:  DESYREL       Allergies: No Known Allergies  Family History: Family History  Problem Relation Age of Onset  . CAD Mother   . Diabetes Mellitus II Father     Social History:  reports that he has been smoking.  He has been smoking about 0.50 packs per day. He has never used smokeless tobacco. He reports that he uses drugs, including  Cocaine. He reports that he does not drink alcohol.  ROS: UROLOGY Frequent Urination?: No Hard to postpone urination?: No Burning/pain with urination?: Yes Get up at night to urinate?: No Leakage of urine?: No Urine stream starts and stops?: Yes Trouble starting stream?: Yes Do you have to strain to urinate?: Yes Blood in urine?: No Urinary tract infection?: No Sexually transmitted disease?: No Injury to kidneys or bladder?: No Painful intercourse?: No Weak stream?: No Erection problems?: Yes Penile pain?: Yes  Gastrointestinal Nausea?: No Vomiting?: No Indigestion/heartburn?: No Diarrhea?: No Constipation?: No  Constitutional Fever: No Night sweats?: No Weight loss?: Yes Fatigue?: No  Skin Skin rash/lesions?: No Itching?: Yes  Eyes Blurred vision?: No Double vision?: No  Ears/Nose/Throat Sore throat?: No Sinus problems?: No  Hematologic/Lymphatic Swollen glands?: No Easy bruising?: No  Cardiovascular Leg swelling?: Yes Chest pain?: No  Respiratory Cough?: No Shortness of breath?: No  Endocrine Excessive thirst?: Yes  Musculoskeletal Back pain?: Yes Joint pain?: Yes  Neurological Headaches?: No Dizziness?: No  Psychologic Depression?: No Anxiety?: No  Physical Exam: There were no vitals taken for this visit.  Constitutional:  Alert and oriented, No acute distress. HEENT: [place] AT, moist mucus membranes.  Trachea midline, no masses. Cardiovascular: No clubbing, cyanosis, or edema. Respiratory: Normal respiratory effort, no increased work of breathing. GI: Abdomen is soft, nontender, nondistended, no abdominal masses GU: No CVA tenderness.  Skin: No rashes, bruises or suspicious lesions. Lymph: No cervical or inguinal adenopathy. Neurologic: Grossly intact, no focal deficits, moving all 4 extremities. Psychiatric: Normal mood and affect. GU: uncircumcised, normal foreskin, testes descended bilaterally, non-tender on exam, no inguinal  hernia or LAD. No clinically significant/palpable/visible hydrocele or varicocele. DRE: prostate 30 g, smooth, no hard area or nodule   Laboratory Data: Lab Results  Component Value Date   WBC 7.1 12/07/2016   HGB 13.7 12/07/2016   HCT 41.4 12/07/2016   MCV 88.4 12/07/2016   PLT 248 12/07/2016    Lab Results  Component Value Date   CREATININE 0.80 12/07/2016    No results found for: PSA  No results found for: TESTOSTERONE  Lab Results  Component Value Date   HGBA1C 14.7 (H) 08/10/2015    Urinalysis    Component Value Date/Time   COLORURINE YELLOW (A) 12/07/2016 1903   APPEARANCEUR CLEAR (A) 12/07/2016 1903   LABSPEC >1.046 (H) 12/07/2016 1903   PHURINE 5.0 12/07/2016 1903   GLUCOSEU >=500 (A) 12/07/2016 1903   HGBUR NEGATIVE 12/07/2016 1903   BILIRUBINUR NEGATIVE 12/07/2016 1903   KETONESUR NEGATIVE 12/07/2016 1903   PROTEINUR NEGATIVE 12/07/2016 1903   NITRITE NEGATIVE 12/07/2016 1903   LEUKOCYTESUR NEGATIVE 12/07/2016 1903    Pertinent Imaging:  Assessment & Plan:  1. Bilateral hydrocele - clinically insignificant  2. Pelvic pain - burning in nature - likely neuropathic pain - PCP should consider referral to pain management. If he continues to have severe pain he may need to return to the ED. The ketorolac seemed to help. He was laughing and talking, walking normally after the injection. He was observed for 30 minutes after the injection and did well.   3.  Weight loss - nl GU exam. I did send a PSA.    No Follow-up on file.  Festus Aloe, [facility] [address], [place], [place] [address] [phone]

## 2016-12-26 LAB — PSA: Prostate Specific Ag, Serum: 0.3 ng/mL (ref 0.0–4.0)

## 2016-12-28 ENCOUNTER — Telehealth: Payer: Self-pay

## 2016-12-28 NOTE — Telephone Encounter (Signed)
Festus Aloe, MD  Lestine Box, LPN        Notify patient his PSA is normal.    [facility]- most recent labs are normal.

## 2017-02-05 ENCOUNTER — Ambulatory Visit: Payer: Medicare (Managed Care)

## 2017-06-10 DIAGNOSIS — L03115 Cellulitis of right lower limb: Secondary | ICD-10-CM | POA: Insufficient documentation

## 2017-06-10 DIAGNOSIS — Z794 Long term (current) use of insulin: Secondary | ICD-10-CM

## 2017-06-10 DIAGNOSIS — I10 Essential (primary) hypertension: Secondary | ICD-10-CM | POA: Insufficient documentation

## 2017-06-10 DIAGNOSIS — E119 Type 2 diabetes mellitus without complications: Secondary | ICD-10-CM | POA: Insufficient documentation

## 2017-06-20 DIAGNOSIS — M86071 Acute hematogenous osteomyelitis, right ankle and foot: Secondary | ICD-10-CM | POA: Insufficient documentation

## 2017-06-20 DIAGNOSIS — C259 Malignant neoplasm of pancreas, unspecified: Secondary | ICD-10-CM | POA: Insufficient documentation

## 2017-06-20 DIAGNOSIS — K209 Esophagitis, unspecified without bleeding: Secondary | ICD-10-CM | POA: Insufficient documentation

## 2017-06-24 DIAGNOSIS — T8131XA Disruption of external operation (surgical) wound, not elsewhere classified, initial encounter: Secondary | ICD-10-CM | POA: Insufficient documentation

## 2017-07-25 DIAGNOSIS — F17200 Nicotine dependence, unspecified, uncomplicated: Secondary | ICD-10-CM | POA: Insufficient documentation

## 2017-08-23 ENCOUNTER — Inpatient Hospital Stay
Admission: EM | Admit: 2017-08-23 | Discharge: 2017-08-29 | DRG: 871 | Disposition: A | Discharge status: Home / Self Care | Payer: Medicare Other | Attending: Internal Medicine | Admitting: Internal Medicine

## 2017-08-23 ENCOUNTER — Emergency Department: Payer: Medicare Other

## 2017-08-23 DIAGNOSIS — A419 Sepsis, unspecified organism: Secondary | ICD-10-CM | POA: Diagnosis not present

## 2017-08-23 DIAGNOSIS — K560 Paralytic ileus: Secondary | ICD-10-CM | POA: Diagnosis present

## 2017-08-23 DIAGNOSIS — I110 Hypertensive heart disease with heart failure: Secondary | ICD-10-CM | POA: Diagnosis present

## 2017-08-23 DIAGNOSIS — Z794 Long term (current) use of insulin: Secondary | ICD-10-CM

## 2017-08-23 DIAGNOSIS — R1084 Generalized abdominal pain: Secondary | ICD-10-CM

## 2017-08-23 DIAGNOSIS — Z955 Presence of coronary angioplasty implant and graft: Secondary | ICD-10-CM

## 2017-08-23 DIAGNOSIS — Z9081 Acquired absence of spleen: Secondary | ICD-10-CM

## 2017-08-23 DIAGNOSIS — I10 Essential (primary) hypertension: Secondary | ICD-10-CM | POA: Diagnosis present

## 2017-08-23 DIAGNOSIS — I4891 Unspecified atrial fibrillation: Secondary | ICD-10-CM | POA: Diagnosis present

## 2017-08-23 DIAGNOSIS — F17200 Nicotine dependence, unspecified, uncomplicated: Secondary | ICD-10-CM | POA: Diagnosis present

## 2017-08-23 DIAGNOSIS — I5032 Chronic diastolic (congestive) heart failure: Secondary | ICD-10-CM | POA: Diagnosis present

## 2017-08-23 DIAGNOSIS — R64 Cachexia: Secondary | ICD-10-CM | POA: Diagnosis present

## 2017-08-23 DIAGNOSIS — I2510 Atherosclerotic heart disease of native coronary artery without angina pectoris: Secondary | ICD-10-CM | POA: Diagnosis present

## 2017-08-23 DIAGNOSIS — E876 Hypokalemia: Secondary | ICD-10-CM | POA: Diagnosis not present

## 2017-08-23 DIAGNOSIS — E873 Alkalosis: Secondary | ICD-10-CM | POA: Diagnosis not present

## 2017-08-23 DIAGNOSIS — I429 Cardiomyopathy, unspecified: Secondary | ICD-10-CM | POA: Diagnosis present

## 2017-08-23 DIAGNOSIS — R739 Hyperglycemia, unspecified: Secondary | ICD-10-CM | POA: Diagnosis not present

## 2017-08-23 DIAGNOSIS — E860 Dehydration: Secondary | ICD-10-CM | POA: Diagnosis present

## 2017-08-23 DIAGNOSIS — E875 Hyperkalemia: Secondary | ICD-10-CM | POA: Diagnosis not present

## 2017-08-23 DIAGNOSIS — I4892 Unspecified atrial flutter: Secondary | ICD-10-CM | POA: Diagnosis present

## 2017-08-23 DIAGNOSIS — E1165 Type 2 diabetes mellitus with hyperglycemia: Secondary | ICD-10-CM | POA: Diagnosis present

## 2017-08-23 DIAGNOSIS — Z0189 Encounter for other specified special examinations: Secondary | ICD-10-CM

## 2017-08-23 DIAGNOSIS — Z681 Body mass index (BMI) 19 or less, adult: Secondary | ICD-10-CM

## 2017-08-23 DIAGNOSIS — E119 Type 2 diabetes mellitus without complications: Secondary | ICD-10-CM

## 2017-08-23 DIAGNOSIS — Z8507 Personal history of malignant neoplasm of pancreas: Secondary | ICD-10-CM

## 2017-08-23 DIAGNOSIS — E1140 Type 2 diabetes mellitus with diabetic neuropathy, unspecified: Secondary | ICD-10-CM | POA: Diagnosis present

## 2017-08-23 DIAGNOSIS — Z7982 Long term (current) use of aspirin: Secondary | ICD-10-CM

## 2017-08-23 DIAGNOSIS — N179 Acute kidney failure, unspecified: Secondary | ICD-10-CM | POA: Diagnosis not present

## 2017-08-23 DIAGNOSIS — K56609 Unspecified intestinal obstruction, unspecified as to partial versus complete obstruction: Secondary | ICD-10-CM

## 2017-08-23 DIAGNOSIS — K567 Ileus, unspecified: Secondary | ICD-10-CM | POA: Diagnosis present

## 2017-08-23 DIAGNOSIS — Z8249 Family history of ischemic heart disease and other diseases of the circulatory system: Secondary | ICD-10-CM

## 2017-08-23 DIAGNOSIS — M549 Dorsalgia, unspecified: Secondary | ICD-10-CM | POA: Diagnosis present

## 2017-08-23 DIAGNOSIS — K9189 Other postprocedural complications and disorders of digestive system: Secondary | ICD-10-CM | POA: Diagnosis present

## 2017-08-23 DIAGNOSIS — E43 Unspecified severe protein-calorie malnutrition: Secondary | ICD-10-CM | POA: Diagnosis present

## 2017-08-23 DIAGNOSIS — G8929 Other chronic pain: Secondary | ICD-10-CM | POA: Diagnosis present

## 2017-08-23 DIAGNOSIS — Z833 Family history of diabetes mellitus: Secondary | ICD-10-CM

## 2017-08-23 LAB — CBC
HCT: 42.9 % (ref 40.0–52.0)
HEMOGLOBIN: 13.7 g/dL (ref 13.0–18.0)
MCH: 28.5 pg (ref 26.0–34.0)
MCHC: 31.9 g/dL — ABNORMAL LOW (ref 32.0–36.0)
MCV: 89.4 fL (ref 80.0–100.0)
PLATELETS: 471 10*3/uL — AB (ref 150–440)
RBC: 4.8 MIL/uL (ref 4.40–5.90)
RDW: 15.1 % — ABNORMAL HIGH (ref 11.5–14.5)
WBC: 25.1 10*3/uL — AB (ref 3.8–10.6)

## 2017-08-23 LAB — BLOOD GAS, VENOUS
ACID-BASE DEFICIT: 4.9 mmol/L — AB (ref 0.0–2.0)
BICARBONATE: 19.8 mmol/L — AB (ref 20.0–28.0)
O2 SAT: 71 %
PATIENT TEMPERATURE: 37
pCO2, Ven: 35 mmHg — ABNORMAL LOW (ref 44.0–60.0)
pH, Ven: 7.36 (ref 7.250–7.430)
pO2, Ven: 39 mmHg (ref 32.0–45.0)

## 2017-08-23 LAB — URINALYSIS, COMPLETE (UACMP) WITH MICROSCOPIC
BILIRUBIN URINE: NEGATIVE
Bacteria, UA: NONE SEEN
HGB URINE DIPSTICK: NEGATIVE
Ketones, ur: 80 mg/dL — AB
LEUKOCYTES UA: NEGATIVE
Nitrite: NEGATIVE
PH: 6 (ref 5.0–8.0)
Protein, ur: 100 mg/dL — AB
SPECIFIC GRAVITY, URINE: 1.029 (ref 1.005–1.030)
Squamous Epithelial / LPF: NONE SEEN

## 2017-08-23 LAB — GLUCOSE, CAPILLARY
GLUCOSE-CAPILLARY: 326 mg/dL — AB (ref 65–99)
Glucose-Capillary: 335 mg/dL — ABNORMAL HIGH (ref 65–99)
Glucose-Capillary: 308 mg/dL — ABNORMAL HIGH (ref 65–99)

## 2017-08-23 LAB — BASIC METABOLIC PANEL
ANION GAP: 22 — AB (ref 5–15)
BUN: 41 mg/dL — ABNORMAL HIGH (ref 6–20)
CALCIUM: 10.3 mg/dL (ref 8.9–10.3)
CHLORIDE: 95 mmol/L — AB (ref 101–111)
CO2: 19 mmol/L — ABNORMAL LOW (ref 22–32)
CREATININE: 1.24 mg/dL (ref 0.61–1.24)
GFR calc non Af Amer: >60 mL/min (ref >60)
Glucose, Bld: 357 mg/dL — ABNORMAL HIGH (ref 65–99)
Potassium: 4.6 mmol/L (ref 3.5–5.1)
SODIUM: 136 mmol/L (ref 135–145)

## 2017-08-23 LAB — LACTIC ACID, PLASMA
Lactic Acid, Venous: 2.1 mmol/L (ref 0.5–1.9)
Lactic Acid, Venous: 1.5 mmol/L (ref 0.5–1.9)

## 2017-08-23 MED ORDER — VANCOMYCIN HCL IN DEXTROSE 1-5 GM/200ML-% IV SOLN
1000.0000 mg | INTRAVENOUS | Status: DC
  Administered 2017-08-24 – 2017-08-25 (×2): 1000 mg via INTRAVENOUS
  Filled 2017-08-23 (×4): qty 200

## 2017-08-23 MED ORDER — ONDANSETRON HCL 4 MG/2ML IJ SOLN
4.0000 mg | Freq: Once | INTRAMUSCULAR | Status: AC
  Administered 2017-08-23: 4 mg via INTRAVENOUS
  Filled 2017-08-23: qty 2

## 2017-08-23 MED ORDER — SODIUM CHLORIDE 0.9 % IV SOLN
INTRAVENOUS | Status: DC
  Administered 2017-08-23: via INTRAVENOUS

## 2017-08-23 MED ORDER — INSULIN REGULAR BOLUS VIA INFUSION
0.0000 [IU] | Freq: Three times a day (TID) | INTRAVENOUS | Status: DC
  Filled 2017-08-23: qty 10

## 2017-08-23 MED ORDER — VANCOMYCIN HCL IN DEXTROSE 1-5 GM/200ML-% IV SOLN
1000.0000 mg | Freq: Once | INTRAVENOUS | Status: AC
  Administered 2017-08-23: 1000 mg via INTRAVENOUS
  Filled 2017-08-23: qty 200

## 2017-08-23 MED ORDER — IOPAMIDOL (ISOVUE-300) INJECTION 61%
30.0000 mL | Freq: Once | INTRAVENOUS | Status: AC
  Administered 2017-08-23: 30 mL via ORAL

## 2017-08-23 MED ORDER — DEXTROSE 50 % IV SOLN
25.0000 mL | INTRAVENOUS | Status: DC | PRN
  Filled 2017-08-23: qty 50

## 2017-08-23 MED ORDER — SODIUM CHLORIDE 0.9 % IV SOLN
INTRAVENOUS | Status: DC
  Administered 2017-08-24: 2.4 [IU]/h via INTRAVENOUS
  Filled 2017-08-23: qty 1

## 2017-08-23 MED ORDER — DEXTROSE-NACL 5-0.45 % IV SOLN
INTRAVENOUS | Status: DC
  Administered 2017-08-24: 01:00:00 via INTRAVENOUS

## 2017-08-23 MED ORDER — SODIUM CHLORIDE 0.9 % IV BOLUS (SEPSIS)
500.0000 mL | Freq: Once | INTRAVENOUS | Status: AC
  Administered 2017-08-23: 500 mL via INTRAVENOUS

## 2017-08-23 MED ORDER — IOPAMIDOL (ISOVUE-300) INJECTION 61%
100.0000 mL | Freq: Once | INTRAVENOUS | Status: AC | PRN
  Administered 2017-08-23: 100 mL via INTRAVENOUS

## 2017-08-23 MED ORDER — PIPERACILLIN-TAZOBACTAM 3.375 G IVPB
3.3750 g | Freq: Three times a day (TID) | INTRAVENOUS | Status: DC
  Administered 2017-08-24 – 2017-08-27 (×10): 3.375 g via INTRAVENOUS
  Filled 2017-08-23 (×10): qty 50

## 2017-08-23 MED ORDER — PIPERACILLIN-TAZOBACTAM 3.375 G IVPB 30 MIN
3.3750 g | Freq: Once | INTRAVENOUS | Status: AC
  Administered 2017-08-23: 3.375 g via INTRAVENOUS
  Filled 2017-08-23: qty 50

## 2017-08-23 MED ORDER — FENTANYL CITRATE (PF) 100 MCG/2ML IJ SOLN
50.0000 ug | Freq: Once | INTRAMUSCULAR | Status: AC
  Administered 2017-08-23: 50 ug via INTRAVENOUS
  Filled 2017-08-23: qty 2

## 2017-08-23 MED ORDER — SODIUM CHLORIDE 0.9 % IV BOLUS (SEPSIS)
2000.0000 mL | Freq: Once | INTRAVENOUS | Status: AC
  Administered 2017-08-23: 2000 mL via INTRAVENOUS

## 2017-08-23 NOTE — Progress Notes (Signed)
Pharmacy Antibiotic Note  Richard [name] is a 61 y.o. male admitted on 08/23/2017 with sepsis.  Pharmacy has been consulted for vancomycin and Zosyn dosing.  Plan: DW 66kg  Vd 46L kei 0.052 hr-1  T1/2 13 hours Vancomycin 1 gram q 18 hours ordered with stacked dosing. Level before 5th dose. Goal trough 15-20.  Zosyn 3.375g IV q8h (4 hour infusion).  Height: 5\' 11"  (180.3 cm) Weight: 144 lb 9.6 oz (65.6 kg) IBW/kg (Calculated) : 75.3  Temp (24hrs), Avg:97.7 F (36.5 C), Min:97.7 F (36.5 C), Max:97.7 F (36.5 C)   Recent Labs Lab 08/23/17 1838 08/23/17 1839 08/23/17 2216  WBC 25.1*  --   --   CREATININE 1.24  --   --   LATICACIDVEN  --  2.1* 1.5    Estimated Creatinine Clearance: 57.3 mL/min (by C-G formula based on SCr of 1.24 mg/dL).    No Known Allergies  Antimicrobials this admission: Vancomycin, Zosyn 11/2 >>    >>   Dose adjustments this admission:   Microbiology results: 11/2 BCx: pending      11/2 UA: (-) 11/2 CXR: No active disease Thank you for allowing pharmacy to be a part of this patient's care.  Derita Michelsen S 08/23/2017 11:08 PM

## 2017-08-23 NOTE — ED Triage Notes (Signed)
Pt arrives to ER via [facility] from home c/o high blood sugar. Reports that blood sugar was over 300 today. Pt is known diabetic. 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 Pt arrives on 2L [place] at 98%. 20G to L forearm started by EMS, 600cc NS given by EMS. Alert and oriented X4.

## 2017-08-23 NOTE — ED Notes (Signed)
BS -326

## 2017-08-23 NOTE — ED Provider Notes (Signed)
[facility] Emergency Department Provider Note ____________________________________________   I have reviewed the triage vital signs and the triage nursing note.  HISTORY  Chief Complaint Hyperglycemia   Historian Level 5 Caveat History Limited by patient very dehydrated, somewhat poor historian right now due to critical illness Ellene Route and daughter provides additional history  HPI Richard [name] is a 61 y.o. male insulin-dependent diabetic presents with abdominal pain nausea and vomiting and concern for dehydration for it sounds like probably 2 or 3 days.  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  No chest pain.  States he has been vomiting, nonbloody.  Unclear whether or not he is been having bowel movements.  Pain is diffuse in his abdomen.  He feels dehydrated.  Symptoms are moderate severe.  Unclear whether or not he had DKA before.   Past Medical History:  Diagnosis Date  . CHF (congestive heart failure) ([place])   . Chronic back pain   . Chronic leg pain   . Coronary artery disease   . Diabetes mellitus without complication ([place])   . Hypertension   . Neuropathy     Patient Active Problem List   Diagnosis Date Noted  . Chest pain 08/10/2015    Past Surgical History:  Procedure Laterality Date  . CORONARY ANGIOPLASTY WITH STENT PLACEMENT      Prior to Admission medications   Medication Sig Start Date End Date Taking? Authorizing Provider  aspirin EC 81 MG tablet Take 1 tablet (81 mg total) by mouth daily. 01/31/16   Jola Schmidt, MD  atorvastatin (LIPITOR) 40 MG tablet  10/30/16   [provider]  cyclobenzaprine (FLEXERIL) 10 MG tablet Take 1 tablet (10 mg total) by mouth 3 (three) times daily as needed for muscle spasms. 12/07/16   Merlyn Lot, MD  DULoxetine (CYMBALTA) 60 MG capsule  09/19/16   [provider]  gabapentin (NEURONTIN) 600 MG tablet Take 1 tablet (600 mg total) by mouth 3 (three) times daily. 01/31/16 01/30/17  Jola Schmidt, MD  GLOBAL EASE INJECT PEN NEEDLES 31G X 5 MM MISC  10/02/16   [provider]  HYDROcodone-acetaminophen (NORCO) 5-325 MG tablet Take 1 tablet by mouth every 4 (four) hours as needed for moderate pain. 12/07/16   Merlyn Lot, MD  insulin glargine (LANTUS) 100 UNIT/ML injection Inject 0.2 mLs (20 Units total) into the skin at bedtime. Sliding scale. Max out at 50 units Patient taking differently: Inject 30 Units into the skin 2 (two) times daily. Sliding scale. Max out at 50 units 01/31/16   Jola Schmidt, MD  insulin regular (NOVOLIN R,HUMULIN R) 100 units/mL injection Inject 0.08 mLs (8 Units total) into the skin 3 (three) times daily before meals. 01/31/16   Jola Schmidt, MD  metFORMIN (GLUMETZA) 500 MG (MOD) 24 hr tablet Take 1,000 mg by mouth 3 (three) times daily. 10/30/16   [provider]  metoprolol tartrate (LOPRESSOR) 25 MG tablet Take 0.5 tablets (12.5 mg total) by mouth 2 (two) times daily. 01/31/16   Jola Schmidt, MD  Needles & Syringes MISC Use as directed 01/31/16   Jola Schmidt, MD  omeprazole (PRILOSEC) 20 MG capsule  09/19/16   [provider]  pravastatin (PRAVACHOL) 80 MG tablet Take 1 tablet (80 mg total) by mouth at bedtime. 01/31/16   Jola Schmidt, MD  traZODone (DESYREL) 50 MG tablet  09/19/16  [provider]    No Known Allergies  Family History  Problem Relation Age of Onset  . CAD Mother   . Diabetes Mellitus II Father     Social History Social History  Substance Use Topics  . Smoking status: Current Every Day Smoker    Packs/day: 0.50  . Smokeless tobacco: Never Used  . Alcohol use No    Review of Systems  Constitutional: Negative for fever. Eyes: Negative for visual changes. ENT: Negative for sore throat. Cardiovascular: Negative for chest pain. Respiratory: Negative  for shortness of breath. Gastrointestinal: Positive for abdominal pain. Genitourinary: Negative for dysuria. Musculoskeletal: Negative for back pain. Skin: Negative for rash. Neurological: Negative for headache.  ____________________________________________   PHYSICAL EXAM:  VITAL SIGNS: ED Triage Vitals  Enc Vitals Group     BP 08/23/17 1820 115/77     Pulse Rate 08/23/17 1820 (!) 108     Resp 08/23/17 1820 (!) 28     Temp 08/23/17 1820 97.7 F (36.5 C)     Temp Source 08/23/17 1820 Oral     SpO2 08/23/17 1820 99 %     Weight 08/23/17 1820 144 lb 9.6 oz (65.6 kg)     Height --      Head Circumference --      Peak Flow --      Pain Score 08/23/17 1819 8     Pain Loc --      Pain Edu? --      Excl. in [place]? --      Constitutional: Alert and oriented, but fairly poor historian.  He looks like he feels very sick and is not talking much HEENT   Head: Normocephalic and atraumatic.      Eyes: Conjunctivae are normal. Pupils equal and round.       Ears:         Nose: No congestion/rhinnorhea.   Mouth/Throat: Mucous membranes are dry.   Neck: No stridor. Cardiovascular/Chest: Tachycardic rate, regular rhythm.  No murmurs, rubs, or gallops. Respiratory: Normal respiratory effort without tachypnea nor retractions. Breath sounds are clear and equal bilaterally. No wheezes/rales/rhonchi. Gastrointestinal: Soft. No distention, no guarding, no rebound.  Moderate diffuse tenderness without focal point tenderness. Genitourinary/rectal:Deferred Musculoskeletal: Nontender with normal range of motion in all extremities. No joint effusions.  No lower extremity tenderness.  No edema. Neurologic:  Normal speech and language. No gross or focal neurologic deficits are appreciated. Skin:  Skin is warm, dry and intact. No rash noted. Psychiatric: No agitation.   ____________________________________________  LABS (pertinent positives/negatives) I, Lisa Roca, MD the attending  physician have reviewed the labs noted below.  Labs Reviewed  BASIC METABOLIC PANEL - Abnormal; Notable for the following:       Result Value   Chloride 95 (*)    CO2 19 (*)    Glucose, Bld 357 (*)    BUN 41 (*)    Anion gap 22 (*)    All other components within normal limits  CBC - Abnormal; Notable for the following:    WBC 25.1 (*)    MCHC 31.9 (*)    RDW 15.1 (*)    Platelets 471 (*)    All other components within normal limits  URINALYSIS, COMPLETE (UACMP) WITH MICROSCOPIC - Abnormal; Notable for the following:    Color, Urine YELLOW (*)    APPearance CLEAR (*)    Glucose, UA >=500 (*)    Ketones, ur 80 (*)    Protein, ur 100 (*)  All other components within normal limits  GLUCOSE, CAPILLARY - Abnormal; Notable for the following:    Glucose-Capillary 335 (*)    All other components within normal limits  BLOOD GAS, VENOUS - Abnormal; Notable for the following:    pCO2, Ven 35 (*)    Bicarbonate 19.8 (*)    Acid-base deficit 4.9 (*)    All other components within normal limits  LACTIC ACID, PLASMA - Abnormal; Notable for the following:    Lactic Acid, Venous 2.1 (*)    All other components within normal limits  CULTURE, BLOOD (ROUTINE X 2)  CULTURE, BLOOD (ROUTINE X 2)  LACTIC ACID, PLASMA  CBG MONITORING, ED    ____________________________________________    EKG I, Lisa Roca, MD, the attending physician have personally viewed and interpreted all ECGs.  170 bpm.  Irregularly irregular tachycardic likely atrial fibrillation with rapid ventricular response. ____________________________________________  RADIOLOGY All Xrays were viewed by me.  Imaging interpreted by Radiologist, and I, Lisa Roca, MD the attending physician have reviewed the radiologist interpretation noted below.  Chest x-ray : Pending  CT abdomen pelvis with contrast: Pending __________________________________________  PROCEDURES  Procedure(s) performed: None  Critical Care  performed: CRITICAL CARE Performed by: Lisa Roca   Total critical care time: 30 minutes  Critical care time was exclusive of separately billable procedures and treating other patients.  Critical care was necessary to treat or prevent imminent or life-threatening deterioration.  Critical care was time spent personally by me on the following activities: development of treatment plan with patient and/or surrogate as well as nursing, discussions with consultants, evaluation of patient's response to treatment, examination of patient, obtaining history from patient or surrogate, ordering and performing treatments and interventions, ordering and review of laboratory studies, ordering and review of radiographic studies, pulse oximetry and re-evaluation of patient's condition.   ____________________________________________  No current facility-administered medications on file prior to encounter.    Current Outpatient Prescriptions on File Prior to Encounter  Medication Sig Dispense Refill  . aspirin EC 81 MG tablet Take 1 tablet (81 mg total) by mouth daily. 30 tablet 0  . atorvastatin (LIPITOR) 40 MG tablet     . cyclobenzaprine (FLEXERIL) 10 MG tablet Take 1 tablet (10 mg total) by mouth 3 (three) times daily as needed for muscle spasms. 12 tablet 0  . DULoxetine (CYMBALTA) 60 MG capsule     . gabapentin (NEURONTIN) 600 MG tablet Take 1 tablet (600 mg total) by mouth 3 (three) times daily. 50 tablet 0  . GLOBAL EASE INJECT PEN NEEDLES 31G X 5 MM MISC     . HYDROcodone-acetaminophen (NORCO) 5-325 MG tablet Take 1 tablet by mouth every 4 (four) hours as needed for moderate pain. 6 tablet 0  . insulin glargine (LANTUS) 100 UNIT/ML injection Inject 0.2 mLs (20 Units total) into the skin at bedtime. Sliding scale. Max out at 50 units (Patient taking differently: Inject 30 Units into the skin 2 (two) times daily. Sliding scale. Max out at 50 units) 10 mL 0  . insulin regular (NOVOLIN R,HUMULIN R)  100 units/mL injection Inject 0.08 mLs (8 Units total) into the skin 3 (three) times daily before meals. 10 mL 11  . metFORMIN (GLUMETZA) 500 MG (MOD) 24 hr tablet Take 1,000 mg by mouth 3 (three) times daily.    . metoprolol tartrate (LOPRESSOR) 25 MG tablet Take 0.5 tablets (12.5 mg total) by mouth 2 (two) times daily. 30 tablet 0  . Needles & Syringes MISC Use as  directed 100 each 3  . omeprazole (PRILOSEC) 20 MG capsule     . pravastatin (PRAVACHOL) 80 MG tablet Take 1 tablet (80 mg total) by mouth at bedtime. 30 tablet 0  . traZODone (DESYREL) 50 MG tablet       ____________________________________________  ED COURSE / ASSESSMENT AND PLAN  Pertinent labs & imaging results that were available during my care of the patient were reviewed by me and considered in my medical decision making (see chart for details).    Arrived looking quite dehydrated with a history of vomiting abdominal pain.  Unclear whether or not he may be in DKA, versus just significant dehydration, he is also complaining of abdominal pain had a history of recent surgery this month which was major intra-abdominal surgery due to pancreatic adenocarcinoma per chart review history of [facility] records.  No reported fevers or febrile here.  Given elevated white blood cell count, I am going to go ahead and cover him for undifferentiated possible sepsis.  CT scan ordered for further investigation into the recent postsurgical time.  Abdominal pain.  IV fluids initiated for hyperglycemia.  He does have a slight anion gap.  VBG shows no acidosis.  Patient care transferred to Dr. Burlene Arnt at shift change 9:15 PM.  Awaiting chest x-ray and abdominal CT.  DIFFERENTIAL DIAGNOSIS: Including but not limited to DKA, dehydration, gastroenteritis, sepsis, pneumonia, urinary tract infection, intra-abdominal surgical complication, etc.  CONSULTATIONS:   None   Patient / Family / Caregiver informed of clinical course, medical  decision-making process, and agree with plan.  ___________________________________________   FINAL CLINICAL IMPRESSION(S) / ED DIAGNOSES   Final diagnoses:  Hyperglycemia  Abdominal pain, diffuse              Note: This dictation was prepared with Dragon dictation. Any transcriptional errors that result from this process are unintentional    Lisa Roca, MD 08/23/17 2120

## 2017-08-23 NOTE — ED Notes (Signed)
Patient encouraged to keep arms straight so fluid could infuse faster.

## 2017-08-23 NOTE — ED Notes (Signed)
Patient states he cannot tolerate PO contrast. Dr. Burlene Arnt gave permission for patient to go to CT scan without finishing PO contrast.

## 2017-08-23 NOTE — ED Notes (Signed)
Dr. Reita Cliche informed of Lactic acid of 2.1

## 2017-08-24 ENCOUNTER — Observation Stay: Payer: Medicare Other

## 2017-08-24 ENCOUNTER — Other Ambulatory Visit: Payer: Self-pay

## 2017-08-24 ENCOUNTER — Observation Stay
Admit: 2017-08-24 | Discharge: 2017-08-24 | Disposition: A | Discharge status: Home / Self Care | Payer: Medicare Other | Attending: Internal Medicine | Admitting: Internal Medicine

## 2017-08-24 DIAGNOSIS — R739 Hyperglycemia, unspecified: Secondary | ICD-10-CM | POA: Diagnosis present

## 2017-08-24 DIAGNOSIS — A419 Sepsis, unspecified organism: Secondary | ICD-10-CM | POA: Diagnosis present

## 2017-08-24 DIAGNOSIS — F17200 Nicotine dependence, unspecified, uncomplicated: Secondary | ICD-10-CM | POA: Diagnosis present

## 2017-08-24 DIAGNOSIS — E1165 Type 2 diabetes mellitus with hyperglycemia: Secondary | ICD-10-CM | POA: Diagnosis present

## 2017-08-24 DIAGNOSIS — G8929 Other chronic pain: Secondary | ICD-10-CM | POA: Diagnosis present

## 2017-08-24 DIAGNOSIS — I4891 Unspecified atrial fibrillation: Secondary | ICD-10-CM | POA: Diagnosis present

## 2017-08-24 DIAGNOSIS — R64 Cachexia: Secondary | ICD-10-CM | POA: Diagnosis present

## 2017-08-24 DIAGNOSIS — Z794 Long term (current) use of insulin: Secondary | ICD-10-CM | POA: Diagnosis not present

## 2017-08-24 DIAGNOSIS — I5032 Chronic diastolic (congestive) heart failure: Secondary | ICD-10-CM | POA: Diagnosis present

## 2017-08-24 DIAGNOSIS — I110 Hypertensive heart disease with heart failure: Secondary | ICD-10-CM | POA: Diagnosis present

## 2017-08-24 DIAGNOSIS — M549 Dorsalgia, unspecified: Secondary | ICD-10-CM | POA: Diagnosis present

## 2017-08-24 DIAGNOSIS — K9189 Other postprocedural complications and disorders of digestive system: Secondary | ICD-10-CM | POA: Diagnosis present

## 2017-08-24 DIAGNOSIS — E119 Type 2 diabetes mellitus without complications: Secondary | ICD-10-CM

## 2017-08-24 DIAGNOSIS — Z7982 Long term (current) use of aspirin: Secondary | ICD-10-CM | POA: Diagnosis not present

## 2017-08-24 DIAGNOSIS — I2510 Atherosclerotic heart disease of native coronary artery without angina pectoris: Secondary | ICD-10-CM | POA: Diagnosis present

## 2017-08-24 DIAGNOSIS — I429 Cardiomyopathy, unspecified: Secondary | ICD-10-CM | POA: Diagnosis present

## 2017-08-24 DIAGNOSIS — I4892 Unspecified atrial flutter: Secondary | ICD-10-CM | POA: Diagnosis present

## 2017-08-24 DIAGNOSIS — Z833 Family history of diabetes mellitus: Secondary | ICD-10-CM | POA: Diagnosis not present

## 2017-08-24 DIAGNOSIS — Z8249 Family history of ischemic heart disease and other diseases of the circulatory system: Secondary | ICD-10-CM | POA: Diagnosis not present

## 2017-08-24 DIAGNOSIS — E1140 Type 2 diabetes mellitus with diabetic neuropathy, unspecified: Secondary | ICD-10-CM | POA: Diagnosis present

## 2017-08-24 DIAGNOSIS — K567 Ileus, unspecified: Secondary | ICD-10-CM | POA: Diagnosis present

## 2017-08-24 DIAGNOSIS — I10 Essential (primary) hypertension: Secondary | ICD-10-CM | POA: Diagnosis present

## 2017-08-24 DIAGNOSIS — E43 Unspecified severe protein-calorie malnutrition: Secondary | ICD-10-CM | POA: Diagnosis present

## 2017-08-24 DIAGNOSIS — Z681 Body mass index (BMI) 19 or less, adult: Secondary | ICD-10-CM | POA: Diagnosis not present

## 2017-08-24 DIAGNOSIS — Z955 Presence of coronary angioplasty implant and graft: Secondary | ICD-10-CM | POA: Diagnosis not present

## 2017-08-24 DIAGNOSIS — N179 Acute kidney failure, unspecified: Secondary | ICD-10-CM | POA: Diagnosis not present

## 2017-08-24 DIAGNOSIS — K560 Paralytic ileus: Secondary | ICD-10-CM | POA: Diagnosis present

## 2017-08-24 DIAGNOSIS — I481 Persistent atrial fibrillation: Secondary | ICD-10-CM | POA: Diagnosis not present

## 2017-08-24 DIAGNOSIS — E873 Alkalosis: Secondary | ICD-10-CM | POA: Diagnosis not present

## 2017-08-24 LAB — BASIC METABOLIC PANEL
Anion gap: 14 (ref 5–15)
Anion gap: 18 — ABNORMAL HIGH (ref 5–15)
BUN: 30 mg/dL — AB (ref 6–20)
BUN: 27 mg/dL — ABNORMAL HIGH (ref 6–20)
CHLORIDE: 105 mmol/L (ref 101–111)
CHLORIDE: 103 mmol/L (ref 101–111)
CO2: 18 mmol/L — AB (ref 22–32)
CO2: 17 mmol/L — ABNORMAL LOW (ref 22–32)
CREATININE: 1.13 mg/dL (ref 0.61–1.24)
CREATININE: 1.05 mg/dL (ref 0.61–1.24)
Calcium: 7.8 mg/dL — ABNORMAL LOW (ref 8.9–10.3)
Calcium: 9 mg/dL (ref 8.9–10.3)
GFR calc Af Amer: >60 mL/min (ref >60)
GFR calc non Af Amer: >60 mL/min (ref >60)
GFR calc non Af Amer: >60 mL/min (ref >60)
GLUCOSE: 308 mg/dL — AB (ref 65–99)
Glucose, Bld: 324 mg/dL — ABNORMAL HIGH (ref 65–99)
POTASSIUM: 4.2 mmol/L (ref 3.5–5.1)
Potassium: 5.1 mmol/L (ref 3.5–5.1)
SODIUM: 138 mmol/L (ref 135–145)
Sodium: 137 mmol/L (ref 135–145)

## 2017-08-24 LAB — GLUCOSE, CAPILLARY
GLUCOSE-CAPILLARY: 321 mg/dL — AB (ref 65–99)
Glucose-Capillary: 186 mg/dL — ABNORMAL HIGH (ref 65–99)
Glucose-Capillary: 208 mg/dL — ABNORMAL HIGH (ref 65–99)
Glucose-Capillary: 298 mg/dL — ABNORMAL HIGH (ref 65–99)
Glucose-Capillary: 301 mg/dL — ABNORMAL HIGH (ref 65–99)
Glucose-Capillary: 340 mg/dL — ABNORMAL HIGH (ref 65–99)

## 2017-08-24 LAB — CBC
HEMATOCRIT: 39.1 % — AB (ref 40.0–52.0)
HEMOGLOBIN: 12.4 g/dL — AB (ref 13.0–18.0)
MCH: 28.5 pg (ref 26.0–34.0)
MCHC: 31.8 g/dL — ABNORMAL LOW (ref 32.0–36.0)
MCV: 89.8 fL (ref 80.0–100.0)
Platelets: 391 10*3/uL (ref 150–440)
RBC: 4.35 MIL/uL — ABNORMAL LOW (ref 4.40–5.90)
RDW: 15.1 % — ABNORMAL HIGH (ref 11.5–14.5)
WBC: 21.7 10*3/uL — AB (ref 3.8–10.6)

## 2017-08-24 LAB — HEPATIC FUNCTION PANEL
ALBUMIN: 3.7 g/dL (ref 3.5–5.0)
ALK PHOS: 25 U/L — AB (ref 38–126)
ALT: 18 U/L (ref 17–63)
AST: 17 U/L (ref 15–41)
BILIRUBIN TOTAL: 1.8 mg/dL — AB (ref 0.3–1.2)
Bilirubin, Direct: 0.1 mg/dL (ref 0.1–0.5)
Indirect Bilirubin: 1.7 mg/dL — ABNORMAL HIGH (ref 0.3–0.9)
Total Protein: 7.2 g/dL (ref 6.5–8.1)

## 2017-08-24 LAB — LIPASE, BLOOD: LIPASE: 15 U/L (ref 11–51)

## 2017-08-24 LAB — TROPONIN I: Troponin I: 0.03 ng/mL (ref <0.03)

## 2017-08-24 LAB — POTASSIUM: Potassium: 4.5 mmol/L (ref 3.5–5.1)

## 2017-08-24 LAB — HEMOGLOBIN A1C
Hgb A1c MFr Bld: 8.8 % — ABNORMAL HIGH (ref 4.8–5.6)
Mean Plasma Glucose: 205.86 mg/dL

## 2017-08-24 LAB — ECHOCARDIOGRAM COMPLETE
Height: 71 in
WEIGHTICAEL: 2215.18 [oz_av]

## 2017-08-24 LAB — MRSA PCR SCREENING: MRSA by PCR: NEGATIVE

## 2017-08-24 LAB — AMYLASE: Amylase: 189 U/L — ABNORMAL HIGH (ref 28–100)

## 2017-08-24 MED ORDER — DILTIAZEM HCL 25 MG/5ML IV SOLN
10.0000 mg | Freq: Once | INTRAVENOUS | Status: AC
  Administered 2017-08-24: 10 mg via INTRAVENOUS
  Filled 2017-08-24: qty 5

## 2017-08-24 MED ORDER — OXYCODONE HCL 5 MG PO TABS
5.0000 mg | ORAL_TABLET | ORAL | Status: DC | PRN
  Filled 2017-08-24: qty 1

## 2017-08-24 MED ORDER — INSULIN ASPART 100 UNIT/ML ~~LOC~~ SOLN: 0.0000 [IU] | SUBCUTANEOUS | Status: DC

## 2017-08-24 MED ORDER — SODIUM CHLORIDE 0.9 % IV SOLN: INTRAVENOUS | Status: AC

## 2017-08-24 MED ORDER — INSULIN ASPART 100 UNIT/ML ~~LOC~~ SOLN: 0.0000 [IU] | Freq: Every day | SUBCUTANEOUS | Status: DC

## 2017-08-24 MED ORDER — ENOXAPARIN SODIUM 40 MG/0.4ML ~~LOC~~ SOLN
40.0000 mg | SUBCUTANEOUS | Status: DC
  Administered 2017-08-25 – 2017-08-26 (×2): 40 mg via SUBCUTANEOUS
  Filled 2017-08-24 (×2): qty 0.4

## 2017-08-24 MED ORDER — INSULIN GLARGINE 100 UNIT/ML ~~LOC~~ SOLN
10.0000 [IU] | Freq: Every day | SUBCUTANEOUS | Status: DC
  Administered 2017-08-24 – 2017-08-28 (×5): 10 [IU] via SUBCUTANEOUS
  Filled 2017-08-24 (×5): qty 0.1

## 2017-08-24 MED ORDER — HYDROMORPHONE HCL 1 MG/ML IJ SOLN
1.0000 mg | INTRAMUSCULAR | Status: DC | PRN
  Administered 2017-08-24 – 2017-08-25 (×4): 1 mg via INTRAVENOUS
  Filled 2017-08-24 (×4): qty 1

## 2017-08-24 MED ORDER — HYDROMORPHONE HCL 1 MG/ML IJ SOLN: 1.0000 mg | INTRAMUSCULAR | Status: DC | PRN

## 2017-08-24 MED ORDER — ACETAMINOPHEN 650 MG RE SUPP: 650.0000 mg | Freq: Four times a day (QID) | RECTAL | Status: DC | PRN

## 2017-08-24 MED ORDER — ACETAMINOPHEN 325 MG PO TABS: 650.0000 mg | ORAL_TABLET | Freq: Four times a day (QID) | ORAL | Status: DC | PRN

## 2017-08-24 MED ORDER — DILTIAZEM HCL 25 MG/5ML IV SOLN
10.0000 mg | Freq: Four times a day (QID) | INTRAVENOUS | Status: DC | PRN
  Administered 2017-08-24: 10:00:00 10 mg via INTRAVENOUS
  Filled 2017-08-24 (×4): qty 5

## 2017-08-24 MED ORDER — AMIODARONE LOAD VIA INFUSION
150.0000 mg | Freq: Once | INTRAVENOUS | Status: AC
  Administered 2017-08-24: 150 mg via INTRAVENOUS
  Filled 2017-08-24: qty 83.34

## 2017-08-24 MED ORDER — DILTIAZEM HCL 100 MG IV SOLR
5.0000 mg/h | INTRAVENOUS | Status: DC
  Administered 2017-08-24: 5 mg/h via INTRAVENOUS
  Filled 2017-08-24 (×3): qty 100

## 2017-08-24 MED ORDER — INSULIN ASPART 100 UNIT/ML ~~LOC~~ SOLN
0.0000 [IU] | SUBCUTANEOUS | Status: DC
  Administered 2017-08-24 – 2017-08-25 (×4): 3 [IU] via SUBCUTANEOUS
  Administered 2017-08-25: 2 [IU] via SUBCUTANEOUS
  Administered 2017-08-25 (×2): 3 [IU] via SUBCUTANEOUS
  Administered 2017-08-26 (×2): 2 [IU] via SUBCUTANEOUS
  Administered 2017-08-26: 3 [IU] via SUBCUTANEOUS
  Administered 2017-08-27: 2 [IU] via SUBCUTANEOUS
  Administered 2017-08-27 (×2): 3 [IU] via SUBCUTANEOUS
  Administered 2017-08-27: 5 [IU] via SUBCUTANEOUS
  Administered 2017-08-27: 15 [IU] via SUBCUTANEOUS
  Administered 2017-08-28: 2 [IU] via SUBCUTANEOUS
  Administered 2017-08-28 (×2): 5 [IU] via SUBCUTANEOUS
  Administered 2017-08-28: 3 [IU] via SUBCUTANEOUS
  Filled 2017-08-24 (×19): qty 1

## 2017-08-24 MED ORDER — ONDANSETRON HCL 4 MG/2ML IJ SOLN: 4.0000 mg | Freq: Four times a day (QID) | INTRAMUSCULAR | Status: DC | PRN

## 2017-08-24 MED ORDER — AMIODARONE HCL IN DEXTROSE 360-4.14 MG/200ML-% IV SOLN
30.0000 mg/h | INTRAVENOUS | Status: DC
  Administered 2017-08-25 – 2017-08-27 (×4): 30 mg/h via INTRAVENOUS
  Filled 2017-08-24 (×5): qty 200

## 2017-08-24 MED ORDER — INSULIN ASPART 100 UNIT/ML ~~LOC~~ SOLN
0.0000 [IU] | Freq: Four times a day (QID) | SUBCUTANEOUS | Status: DC
  Administered 2017-08-24 (×2): 5 [IU] via SUBCUTANEOUS
  Filled 2017-08-24 (×2): qty 1

## 2017-08-24 MED ORDER — METOCLOPRAMIDE HCL 5 MG/ML IJ SOLN
5.0000 mg | Freq: Four times a day (QID) | INTRAMUSCULAR | Status: DC
  Administered 2017-08-24 – 2017-08-29 (×18): 5 mg via INTRAVENOUS
  Filled 2017-08-24 (×20): qty 2

## 2017-08-24 MED ORDER — INSULIN ASPART 100 UNIT/ML ~~LOC~~ SOLN: 0.0000 [IU] | Freq: Three times a day (TID) | SUBCUTANEOUS | Status: DC

## 2017-08-24 MED ORDER — INSULIN ASPART 100 UNIT/ML ~~LOC~~ SOLN
0.0000 [IU] | Freq: Four times a day (QID) | SUBCUTANEOUS | Status: DC
  Administered 2017-08-24: 07:00:00 7 [IU] via SUBCUTANEOUS
  Filled 2017-08-24: qty 1

## 2017-08-24 MED ORDER — MORPHINE SULFATE (PF) 2 MG/ML IV SOLN
2.0000 mg | INTRAVENOUS | Status: DC | PRN
  Administered 2017-08-24: 4 mg via INTRAVENOUS
  Administered 2017-08-24: 2 mg via INTRAVENOUS
  Filled 2017-08-24: qty 1
  Filled 2017-08-24: qty 2

## 2017-08-24 MED ORDER — AMIODARONE HCL IN DEXTROSE 360-4.14 MG/200ML-% IV SOLN
60.0000 mg/h | INTRAVENOUS | Status: AC
  Administered 2017-08-24: 60 mg/h via INTRAVENOUS
  Filled 2017-08-24 (×2): qty 200

## 2017-08-24 MED ORDER — ONDANSETRON HCL 4 MG PO TABS: 4.0000 mg | ORAL_TABLET | Freq: Four times a day (QID) | ORAL | Status: DC | PRN

## 2017-08-24 NOTE — ED Notes (Signed)
Called to give report. Nurse said she would call back

## 2017-08-24 NOTE — ED Notes (Signed)
Date and time results received: 08/24/17 0033 (use smartphrase ".now" to insert current time)  Test: Troponin Critical Value: 0.03  Name of Provider Notified: McShane  Orders Received? Or Actions Taken?:

## 2017-08-24 NOTE — Progress Notes (Signed)
Patient went from Sinus Tach 110's, to Afib, and then Aflutter rate initally 130s. MD notified. 10 mg IV cardizem given to pt. Pt responded, but within minutes had reverted to Aflutter, with a rate regularly going into the 180s. MD notified and ordered pt transfer to CCU.  Aide was bathing patient and pts NG tube was pulled out during this time. CCU nurse aware that patient was transferring without NG tube.

## 2017-08-24 NOTE — Progress Notes (Signed)
Pt admitted to [facility] unit11/2 with c/o abdominal pain, nausea, and vomiting he is 3 weeks s/p complex surgery at [facility] for pancreatic cancer current symptoms concerning for ileus and surgical team consulted.  Dr. Adonis Huguenin has spoken to [facility] surgical team who agree with NG tube for decompression and empiric abx. The pt was transferred to the stepdown unit 11/3 due to afibb with rvr heart 180's requiring amiodarone gtt.  The pt is alert and oriented, follows commands, lungs clear throughout, afibb on cardiac monitor hr 111, and hypoactive BS with healing midline incision.  PCCM team will follow while pt in [facility].  Marda Stalker, [place] Pager [phone] (please enter 7 digits) PCCM Consult Pager [phone] (please enter 7 digits)

## 2017-08-24 NOTE — Plan of Care (Signed)
Problem: Education: Goal: Knowledge of [facility] Education information/materials will improve Outcome: Progressing Pt likes to be called [place]   Past Medical History:  Diagnosis Date  . CHF (congestive heart failure) ([place])   . Chronic back pain   . Chronic leg pain   . Coronary artery disease   . Diabetes mellitus without complication ([place])   . Hypertension   . Neuropathy    Pt is well controlled with home medications

## 2017-08-24 NOTE — Consult Note (Addendum)
Patient ID: Richard [name], male   DOB: Feb 08, 1956, 61 y.o.   MRN: [id number]  CC: not feeling well  HPI Richard [name] is a 61 y.o. male who is currently admitted to the medicine service.  General surgery consult requested by Dr.Sainani for evaluation of suspected ileus.  Patient presented to the emergency department last night with abdominal pain, nausea, vomiting.  Patient reports has been going on for several days but acutely worsened yesterday.  He is approximately 3 weeks status post complex pancreatic surgery for pancreatic cancer.  Patient is a poor historian but currently denies any nausea.  He does have an NG tube to low intermittent wall suction.  He denies any fevers, chills, shortness of breath, chest pain, dysuria, diarrhea, constipation.  He has had abdominal pain, nausea, vomiting and overall fatigue.  His last bowel movement was normal per him.  Patient reports his abdominal pain is over the entire abdomen he cannot localize it to any one position.  HPI  Past Medical History:  Diagnosis Date  . CHF (congestive heart failure) ([place])   . Chronic back pain   . Chronic leg pain   . Coronary artery disease   . Diabetes mellitus without complication ([place])   . Hypertension   . Neuropathy     Past Surgical History:  Procedure Laterality Date  . 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 CAD Mother   . Diabetes Mellitus II Father     Social History Social History  Substance Use Topics  . Smoking status: Current Every Day Smoker    Packs/day: 0.50  . Smokeless tobacco: Never Used  . Alcohol use No    No Known Allergies  Current Facility-Administered Medications  Medication Dose Route Frequency Provider Last Rate Last Dose  . 0.9 %  sodium chloride infusion   Intravenous Continuous Lance Coon, MD 75 mL/hr at 08/24/17 0258    .  acetaminophen (TYLENOL) tablet 650 mg  650 mg Oral Q6H PRN Lance Coon, MD       Or  . acetaminophen (TYLENOL) suppository 650 mg  650 mg Rectal Q6H PRN Lance Coon, MD      . diltiazem (CARDIZEM) injection 10 mg  10 mg Intravenous Q6H PRN Henreitta Leber, MD   10 mg at 08/24/17 0953  . enoxaparin (LOVENOX) injection 40 mg  40 mg Subcutaneous Q24H Lance Coon, MD      . insulin aspart (novoLOG) injection 0-9 Units  0-9 Units Subcutaneous Q6H Lance Coon, MD   7 Units at 08/24/17 [phone]  . insulin glargine (LANTUS) injection 10 Units  10 Units Subcutaneous Daily Lance Coon, MD   10 Units at 08/24/17 0329  . metoCLOPramide (REGLAN) injection 5 mg  5 mg Intravenous Q6H Lance Coon, MD   5 mg at 08/24/17 0944  . morphine 2 MG/ML injection 2-4 mg  2-4 mg Intravenous Q4H PRN Harrie Foreman, MD   2 mg at 08/24/17 0327  . ondansetron (ZOFRAN) tablet 4 mg  4 mg Oral Q6H PRN Lance Coon, MD       Or  . ondansetron [facility]) injection 4 mg  4 mg Intravenous Q6H PRN Lance Coon, MD      . oxyCODONE (Oxy IR/ROXICODONE) immediate release tablet 5 mg  5 mg Oral Q4H PRN Lance Coon, MD      . piperacillin-tazobactam (ZOSYN) IVPB 3.375 g  3.375 g Intravenous Vickey Huger, MD 12.5 mL/hr at 08/24/17 0653 3.375 g at 08/24/17 0653  . vancomycin (VANCOCIN) IVPB 1000 mg/200 mL premix  1,000 mg Intravenous Q18H Lisa Roca, MD         Review of Systems A multi-point review of systems was asked and was negative except for the findings documented in the HPI  Physical Exam Blood pressure (!) 156/70, pulse (!) 116, temperature 98.8 F (37.1 C), temperature source Oral, resp. rate (!) 22, height 5\' 11"  (1.803 m), weight 64 kg (141 lb 1 oz), SpO2 98 %. CONSTITUTIONAL: Laying in bed in no acute distress but obvious discomfort. EYES: Pupils are equal, round, and reactive to light, Sclera are non-icteric. EARS, NOSE, MOUTH AND THROAT: The oropharynx is clear. The oral mucosa is pink and moist.  Hearing is intact to voice. LYMPH NODES:  Lymph nodes in the neck are normal. RESPIRATORY:  Lungs are clear. There is normal respiratory effort, with equal breath sounds bilaterally, and without pathologic use of accessory muscles. CARDIOVASCULAR: Heart is tachycardic and irregularly irregul ar GI: The abdomen is soft, tender to palpation in the midline at a dressing over a midline wound that is healing well without evidence of erythema or drainage, and nondistended. There are no palpable masses. There are normal bowel sounds in all quadrants. GU: Rectal deferred.   MUSCULOSKELETAL: Normal muscle strength and tone. No cyanosis or edema.   SKIN: Turgor is good and there are no pathologic skin lesions or ulcers. NEUROLOGIC: Motor and sensation is grossly normal. Cranial nerves are grossly intact. PSYCH:  Oriented to person, place and time.   Data Reviewed Images and labs reviewed.  Labs are significant for leukocytosis of 21.7, hyperglycemia 324 with the remainder of the labs are unremarkable however there are no LFTs or pancreatic studies.  CT scan of the abdomen shows evidence of a partial pancreatectomy and splenectomy.  A massively distended stomach that is fluid containing with some hazy inflammation in the right lower quadrant.  There is no evidence of free air there is no evidence of abscess.  The small bowel itself is not actually distended. I have personally reviewed the patient's imaging, laboratory findings and medical records.    Assessment    Abdominal pain    Plan    61 year old male who is 3 weeks status post complex surgery for pancreatic cancer with abdominal pain, nausea, vomiting.  He is now in A. fib with RVR.  Cardiology is on board.  NG tube is decompressing what appears to be dark blood-tinged fluid from the stomach.  Patient is in obvious discomfort.  I have called [facility] but have yet to hear back from the surgery team there.  I am ordering LFTs and pancreatic  enzymes to be performed this morning.  Patient has an elevated white blood cell count without a clear source.  Agree with empiric broad-spectrum coverage.  Should patient require any operative intervention it would be better suited at [facility] where his most recent surgery was performed.  Should the patient have massive derangement of his liver function or pancreatic enzymes then transfer would be warranted.  However should he have prompt resolution of the apparent ileus then transfer would not be needed.     Time spent with the patient was 80 minutes, with more than 50% of the time spent in face-to-face education, counseling and care coordination.     Clayburn Pert, MD FACS General Surgeon 08/24/2017, 10:20 AM   Addendum: 11:10 AM  Case discussed with surgery team at [facility].  Should there be any signs of anastomotic disruption, leak, obstruction we then I will call them back to initiate transfer.  To long as this is just an ileus then we will continue to treat locally. Should the [facility] surgical oncology team be needed they can be reached through the [facility] hospital operator at [phone], ask the operator to page the [facility] surgery team for surgical oncology. Clayburn Pert, MD [facility]  Day [facility] [phone] Night [facility] [phone]

## 2017-08-24 NOTE — ED Notes (Signed)
[id number]

## 2017-08-24 NOTE — Progress Notes (Signed)
Patient was already seen this morning and was transferred to ICU because of heart rate being 180 with A. fib and rapid ventricular response rate. We will discontinue Cardizem and start IV amiodarone as per protocol along with giving bolus. Echocardiogram showed normal left ventricular systolic function.

## 2017-08-24 NOTE — Progress Notes (Signed)
[facility] [place] NAME: Richard [name]    MR#:  [id number]  DATE OF BIRTH:  [birth year]  SUBJECTIVE:   Patient here due to abdominal pain and noted to have CT scan findings suggestive of ileus. He is also noted to be in atrial fibrillation/flutter with RVR. Heart rates are quite labile this morning and therefore patient will be transferred to stepdown and placed on a Cardizem drip. Patient only complaint is that he has a dry mouth. he denies any nausea vomiting or worsening abdominal pain.  REVIEW OF SYSTEMS:    Review of Systems  Constitutional: Negative for fever and weight loss.  HENT: Negative for congestion, nosebleeds and tinnitus.   Eyes: Negative for blurred vision, double vision and redness.  Respiratory: Negative for cough, hemoptysis and shortness of breath.   Cardiovascular: Negative for chest pain, orthopnea, leg swelling and PND.  Gastrointestinal: Positive for abdominal pain. Negative for diarrhea, melena, nausea and vomiting.  Genitourinary: Negative for dysuria, hematuria and urgency.  Musculoskeletal: Negative for falls and joint pain.  Neurological: Negative for dizziness, tingling, sensory change, focal weakness, seizures, weakness and headaches.  Endo/Heme/Allergies: Negative for polydipsia. Does not bruise/bleed easily.  Psychiatric/Behavioral: Negative for depression and memory loss. The patient is not nervous/anxious.     Nutrition: NPO Tolerating Diet: No Tolerating PT: Await Eval.   DRUG ALLERGIES:  No Known Allergies  VITALS:  Blood pressure (!) 156/70, pulse (!) 116, temperature 98.8 F (37.1 C), temperature source Oral, resp. rate (!) 22, height 5' 11"  (1.803 m), weight 64 kg (141 lb 1 oz), SpO2 98 %.  PHYSICAL EXAMINATION:   Physical Exam  GENERAL:  61 y.o.-year-old cachectic patient lying in bed in mild distress.  EYES: Pupils equal, round, reactive to light and accommodation. No scleral icterus.  Extraocular muscles intact.  HEENT: Head atraumatic, normocephalic. NG tube in place.  NECK:  Supple, no jugular venous distention. No thyroid enlargement, no tenderness.  LUNGS: Normal breath sounds bilaterally, no wheezing, rales, rhonchi. No use of accessory muscles of respiration.  CARDIOVASCULAR: S1, S2 Irregular. No murmurs, rubs, or gallops.  ABDOMEN: Soft, nontender, nondistended. Bowel sounds present. No organomegaly or mass. + mid-abdomen dressing in place from recent surgery.  EXTREMITIES: No cyanosis, clubbing or edema b/l.    NEUROLOGIC: Cranial nerves II through XII are intact. No focal Motor or sensory deficits b/l.   PSYCHIATRIC: The patient is alert and oriented x 3.  SKIN: No obvious rash, lesion, or ulcer.    LABORATORY PANEL:   CBC  Recent Labs Lab 08/24/17 0557  WBC 21.7*  HGB 12.4*  HCT 39.1*  PLT 391   ------------------------------------------------------------------------------------------------------------------  Chemistries   Recent Labs Lab 08/24/17 0557  NA 138  K 4.2  CL 103  CO2 17*  GLUCOSE 324*  BUN 27*  CREATININE 1.05  CALCIUM 9.0   ------------------------------------------------------------------------------------------------------------------  Cardiac Enzymes  Recent Labs Lab 08/23/17 2325  TROPONINI 0.03*   ------------------------------------------------------------------------------------------------------------------  RADIOLOGY:  Ct Abdomen Pelvis W Contrast  Result Date: 08/23/2017 CLINICAL DATA:  Nausea, vomiting, suspected dehydration. Pancreatic and splenic surgery for pancreatic cancer July 31, 2017. Recent toe amputation. History of diabetes. EXAM: CT ABDOMEN AND PELVIS WITH CONTRAST TECHNIQUE: Multidetector CT imaging of the abdomen and pelvis was performed using the standard protocol following bolus administration of intravenous contrast. CONTRAST:  166m ISOVUE-300 IOPAMIDOL (ISOVUE-300) INJECTION 61%  COMPARISON:  CT abdomen and pelvis December 07, 2016 FINDINGS: LOWER CHEST: Lung bases are clear. Included heart  size is normal. No pericardial effusion. HEPATOBILIARY: Diffusely hypodense liver suggesting steatosis. Mild focal fatty sparing about the gallbladder fossa. Normal gallbladder. PANCREAS: Status pancreatectomy at proximal body. SPLEEN: Status post splenectomy. ADRENALS/URINARY TRACT: Kidneys are orthotopic, demonstrating symmetric enhancement. No nephrolithiasis, hydronephrosis or solid renal masses. Too small to characterize hypodensities LEFT kidney. The unopacified ureters are normal in course and caliber. Delayed imaging through the kidneys demonstrates symmetric prompt contrast excretion within the proximal urinary collecting system. Urinary bladder is well distended and unremarkable. Normal adrenal glands. STOMACH/BOWEL: Thickened edematous distal esophagus. Fluid distended stomach with gastric antral wall thickening and inflammation. The small and large bowel are normal in course and caliber without inflammatory changes, sensitivity decreased without oral contrast (minimal distal small bowel density could reflect retained oral contrast. Normal identifiable portions of the appendix. VASCULAR/LYMPHATIC: Aortoiliac vessels are normal in course and caliber. Moderate calcific atherosclerosis. No lymphadenopathy by CT size criteria. REPRODUCTIVE: Small hydro cells included scrotum. Prostate size is normal. OTHER: RIGHT lower quadrant fat stranding and hyperemia without focal abnormality. No intraperitoneal free fluid or free air. MUSCULOSKELETAL: Nonacute.  Severe L5-S1 degenerative disc. IMPRESSION: 1. Status post partial pancreatectomy and, splenectomy with expected postoperative change. 2. Fluid distended stomach. Given recent pancreatic surgery, this could reflect focal ileus or, obstruction. Patient may benefit from placement of nasogastric tube. 3. Partially imaged suspected esophagitis.  Mild  gastritis. 4. RIGHT lower quadrant inflammation, possibly postoperative. Normal identifiable portions of the appendix. Aortic Atherosclerosis (ICD10-I70.0). Electronically Signed   By: Elon Alas M.D.   On: 08/23/2017 23:26   Dg Chest Port 1 View  Result Date: 08/23/2017 CLINICAL DATA:  Initial evaluation for acute nausea, vomiting. EXAM: PORTABLE CHEST 1 VIEW COMPARISON:  Prior radiograph from 08/10/2015. FINDINGS: The cardiac and mediastinal silhouettes are stable in size and contour, and remain within normal limits. Aortic atherosclerosis. The lungs are normally inflated. No airspace consolidation, pleural effusion, or pulmonary edema is identified. There is no pneumothorax. No acute osseous abnormality identified. IMPRESSION: 1. No active cardiopulmonary disease. 2. Aortic atherosclerosis. Electronically Signed   By: Jeannine Boga M.D.   On: 08/23/2017 21:40     ASSESSMENT AND PLAN:   61 year old male with past medical history of pancreatic cancer status post recent pancreatectomy with splenectomy done at [facility], essential hypertension, diabetes, history of coronary artery disease, history of congestive heart failure who presents to the hospital due to abdominal pain and noted to have an ileus based on a CAT scan and also with the leukocytosis and tachycardia with meeting sepsis criteria.  1. Sepsis-patient met criteria given his leukocytosis, tachycardia. No clear source of the sepsis has been identified. Patient's urinalysis is been negative, chest x-ray shows no acute pathology. His CT scan of the abdomen and pelvis showed ileus with gastritis but no evidence of any acute infectious process. -Clinically patient is afebrile and hemodynamically stable. I will continue IV vancomycin and Zosyn for now. Follow cultures. Possibly take off antibiotics in next 24-48 hours.  2. Ileus-this is a cause of patient's abdominal pain distention and nausea. Patient CT scan of the abdomen pelvis  showed a suggested ileus. Patient is status post NG tube placement with significant drainage from his stomach. -A surgical consult has been obtained. Patient is status post recent pancreatectomy and splenectomy secondary to pancreatic cancer. This was done at [facility]. Unclear this is a result of his recent surgery and will await LFTs and lipase level. The surgeons have placed a call out to the surgeons at [facility] and  if needed we'll transfer the patient there.  3. Atrial flutter with RVR-this is secondary to his ileus and underlying illness. -Patient has been given multiple doses of IV Cardizem and his heart rates has been labile. We'll place him on a Cardizem drip and transfer him to the ICU. Discussed with Dr. Henrene Hawking.  - appreciate Cards input and cont. Current. Care.  Await Echo.   4. Diabetes type 2 without complication-blood sugars are somewhat labile. -We'll increase sliding scale insulin, continue Lantus. Check a hemoglobin A1c. -Consider diabetes coordinator consult.  5. Leukocytosis-source unclear. We'll follow white cell count with IV and back to therapy.    All the records are reviewed and case discussed with Care Management/Social Worker. Management plans discussed with the patient, family and they are in agreement.  CODE STATUS: Full code  DVT Prophylaxis: Lovenox  TOTAL CRITICAL CARE TIME TAKING CARE OF THIS PATIENT: 35 minutes.   POSSIBLE D/C IN 3-4 DAYS, DEPENDING ON CLINICAL CONDITION.   Henreitta Leber M.D on 08/24/2017 at 11:03 AM  Between 7am to 6pm - Pager - [phone]  After 6pm go to [email or web address] - Proofreader  [facility]  Office  [phone]  CC: Primary care physician; Patient, No Pcp Per

## 2017-08-24 NOTE — H&P (Signed)
[facility] NAME: Richard [name]    MR#:  [id number]  DATE OF BIRTH:  09-Feb-1956  DATE OF ADMISSION:  08/23/2017  PRIMARY CARE PHYSICIAN: Patient, No Pcp Per   REQUESTING/REFERRING PHYSICIAN: Reita Cliche, MD  CHIEF COMPLAINT:   Chief Complaint  Patient presents with  . Hyperglycemia    HISTORY OF PRESENT ILLNESS:  Richard [name]  is a 61 y.o. male who presents with abdominal pain and nausea and vomiting. Patient states that this has been going on for couple of days, but was getting better and then got acutely worse again today. Here in the ED was found to have significant lab derangement with some decreased bicarbonate, increased anion gap, he was hyperglycemic, though his blood pH on blood gas was within normal limits. He had a mildly elevated lactic acid, as well as an elevated white blood cell count. He met sepsis criteria and was treated as code sepsis. However, on further workup infectious sources unclear. His electrolyte derangements corrected significantly after IV fluids and a brief period of time on insulin drip. CT abdomen showed some fluid level in his GI tract which was somewhat concerning for ileus versus possible obstruction. However, patient states that he's been moving his bowels without difficulty. Hospitalists were called for admission  PAST MEDICAL HISTORY:   Past Medical History:  Diagnosis Date  . CHF (congestive heart failure) ([place])   . Chronic back pain   . Chronic leg pain   . Coronary artery disease   . Diabetes mellitus without complication ([place])   . Hypertension   . Neuropathy     PAST SURGICAL HISTORY:   Past Surgical History:  Procedure Laterality Date  . CORONARY ANGIOPLASTY WITH STENT PLACEMENT      SOCIAL HISTORY:   Social History  Substance Use Topics  . Smoking status: Current Every Day Smoker    Packs/day: 0.50  . Smokeless tobacco: Never Used  . Alcohol use No    FAMILY HISTORY:    Family History  Problem Relation Age of Onset  . CAD Mother   . Diabetes Mellitus II Father     DRUG ALLERGIES:  No Known Allergies  MEDICATIONS AT HOME:   Prior to Admission medications   Medication Sig Start Date End Date Taking? Authorizing Provider  aspirin EC 81 MG tablet Take 1 tablet (81 mg total) by mouth daily. 01/31/16  Yes Jola Schmidt, MD  atorvastatin (LIPITOR) 40 MG tablet Take 40 mg by mouth every evening.   Yes [provider]  enoxaparin (LOVENOX) 40 MG/0.4ML injection Inject 40 mg into the skin daily.   Yes [provider]  gabapentin (NEURONTIN) 800 MG tablet Take 800 mg by mouth 3 (three) times daily.   Yes [provider]  HYDROmorphone (DILAUDID) 2 MG tablet Take 2 mg by mouth every 6 (six) hours as needed for pain.   Yes [provider]  insulin glargine (LANTUS) 100 UNIT/ML injection Inject 0.2 mLs (20 Units total) into the skin at bedtime. Sliding scale. Max out at 50 units Patient taking differently: Inject 20 Units into the skin daily. Sliding scale. Max out at 50 units 01/31/16  Yes Jola Schmidt, MD  insulin lispro (HUMALOG) 100 UNIT/ML injection Inject 3 Units into the skin 3 (three) times daily with meals.   Yes [provider]  metFORMIN (GLUCOPHAGE) 500 MG tablet Take 500 mg by mouth 2 (two) times daily with a meal.   Yes [provider]  metoCLOPramide (REGLAN) 10 MG tablet Take 10 mg by mouth 3 (three) times daily as needed for nausea.   Yes [provider]  metoprolol tartrate (LOPRESSOR) 25 MG tablet Take 0.5 tablets (12.5 mg total) by mouth 2 (two) times daily. Patient taking differently: Take 25 mg by mouth 2 (two) times daily.  01/31/16  Yes Jola Schmidt, MD  ondansetron (ZOFRAN) 4 MG tablet Take 4 mg by mouth every 8 (eight) hours as needed for nausea or vomiting.   Yes [provider]  pantoprazole (PROTONIX) 40 MG tablet Take 40 mg by mouth 2 (two) times daily.   Yes  [provider]  cyclobenzaprine (FLEXERIL) 10 MG tablet Take 1 tablet (10 mg total) by mouth 3 (three) times daily as needed for muscle spasms. Patient not taking: Reported on 08/23/2017 12/07/16   Merlyn Lot, MD  gabapentin (NEURONTIN) 600 MG tablet Take 1 tablet (600 mg total) by mouth 3 (three) times daily. 01/31/16 01/30/17  Jola Schmidt, MD  GLOBAL EASE INJECT PEN NEEDLES 31G X 5 MM MISC  10/02/16   [provider]  HYDROcodone-acetaminophen (NORCO) 5-325 MG tablet Take 1 tablet by mouth every 4 (four) hours as needed for moderate pain. Patient not taking: Reported on 08/23/2017 12/07/16   Merlyn Lot, MD  insulin regular (NOVOLIN R,HUMULIN R) 100 units/mL injection Inject 0.08 mLs (8 Units total) into the skin 3 (three) times daily before meals. Patient not taking: Reported on 08/23/2017 01/31/16   Jola Schmidt, MD  Needles & Syringes MISC Use as directed 01/31/16   Jola Schmidt, MD  pravastatin (PRAVACHOL) 80 MG tablet Take 1 tablet (80 mg total) by mouth at bedtime. Patient not taking: Reported on 08/23/2017 01/31/16   Jola Schmidt, MD    REVIEW OF SYSTEMS:  Review of Systems  Constitutional: Positive for malaise/fatigue. Negative for chills, fever and weight loss.  HENT: Negative for ear pain, hearing loss and tinnitus.   Eyes: Negative for blurred vision, double vision, pain and redness.  Respiratory: Negative for cough, hemoptysis and shortness of breath.   Cardiovascular: Negative for chest pain, palpitations, orthopnea and leg swelling.  Gastrointestinal: Positive for abdominal pain, nausea and vomiting. Negative for constipation and diarrhea.  Genitourinary: Negative for dysuria, frequency and hematuria.  Musculoskeletal: Negative for back pain, joint pain and neck pain.  Skin:       No acne, rash, or lesions  Neurological: Negative for dizziness, tremors, focal weakness and weakness.  Endo/Heme/Allergies: Negative for polydipsia. Does not  bruise/bleed easily.  Psychiatric/Behavioral: Negative for depression. The patient is not nervous/anxious and does not have insomnia.      VITAL SIGNS:   Vitals:   08/23/17 2233 08/23/17 2300 08/23/17 2330 08/24/17 0030  BP: (!) 154/82 138/83 123/78 125/62  Pulse: (!) 110 85 (!) 111 (!) 114  Resp: 16 16 19 19   Temp:      TempSrc:      SpO2: 99% 98% 98% 99%  Weight:      Height:       Wt Readings from Last 3 Encounters:  08/23/17 65.6 kg (144 lb 9.6 oz)  12/25/16 69.7 kg (153 lb 9.6 oz)  12/07/16 83.9 kg (185 lb)    PHYSICAL EXAMINATION:  Physical Exam  Vitals reviewed. Constitutional: He is oriented to person, place, and time. He appears well-developed and well-nourished. No distress.  HENT:  Head: Normocephalic and atraumatic.  Mouth/Throat: Oropharynx is clear and moist.  Eyes: Pupils are equal, round, and reactive to light.  Conjunctivae and EOM are normal. No scleral icterus.  Neck: Normal range of motion. Neck supple. No JVD present. No thyromegaly present.  Cardiovascular: Regular rhythm and intact distal pulses.  Exam reveals no gallop and no friction rub.   No murmur heard. tachycardic  Respiratory: Effort normal and breath sounds normal. No respiratory distress. He has no wheezes. He has no rales.  GI: Soft. Bowel sounds are normal. He exhibits no distension. There is tenderness.  Musculoskeletal: Normal range of motion. He exhibits no edema.  No arthritis, no gout  Lymphadenopathy:    He has no cervical adenopathy.  Neurological: He is alert and oriented to person, place, and time. No cranial nerve deficit.  No dysarthria, no aphasia  Skin: Skin is warm and dry. No rash noted. No erythema.  Psychiatric: He has a normal mood and affect. His behavior is normal. Judgment and thought content normal.    LABORATORY PANEL:   CBC  Recent Labs Lab 08/23/17 1838  WBC 25.1*  HGB 13.7  HCT 42.9  PLT 471*    ------------------------------------------------------------------------------------------------------------------  Chemistries   Recent Labs Lab 08/23/17 2325 08/24/17 0036  NA 137  --   K 5.1 4.5  CL 105  --   CO2 18*  --   GLUCOSE 308*  --   BUN 30*  --   CREATININE 1.13  --   CALCIUM 7.8*  --    ------------------------------------------------------------------------------------------------------------------  Cardiac Enzymes  Recent Labs Lab 08/23/17 2325  TROPONINI 0.03*   ------------------------------------------------------------------------------------------------------------------  RADIOLOGY:  Ct Abdomen Pelvis W Contrast  Result Date: 08/23/2017 CLINICAL DATA:  Nausea, vomiting, suspected dehydration. Pancreatic and splenic surgery for pancreatic cancer July 31, 2017. Recent toe amputation. History of diabetes. EXAM: CT ABDOMEN AND PELVIS WITH CONTRAST TECHNIQUE: Multidetector CT imaging of the abdomen and pelvis was performed using the standard protocol following bolus administration of intravenous contrast. CONTRAST:  144m ISOVUE-300 IOPAMIDOL (ISOVUE-300) INJECTION 61% COMPARISON:  CT abdomen and pelvis December 07, 2016 FINDINGS: LOWER CHEST: Lung bases are clear. Included heart size is normal. No pericardial effusion. HEPATOBILIARY: Diffusely hypodense liver suggesting steatosis. Mild focal fatty sparing about the gallbladder fossa. Normal gallbladder. PANCREAS: Status pancreatectomy at proximal body. SPLEEN: Status post splenectomy. ADRENALS/URINARY TRACT: Kidneys are orthotopic, demonstrating symmetric enhancement. No nephrolithiasis, hydronephrosis or solid renal masses. Too small to characterize hypodensities LEFT kidney. The unopacified ureters are normal in course and caliber. Delayed imaging through the kidneys demonstrates symmetric prompt contrast excretion within the proximal urinary collecting system. Urinary bladder is well distended and  unremarkable. Normal adrenal glands. STOMACH/BOWEL: Thickened edematous distal esophagus. Fluid distended stomach with gastric antral wall thickening and inflammation. The small and large bowel are normal in course and caliber without inflammatory changes, sensitivity decreased without oral contrast (minimal distal small bowel density could reflect retained oral contrast. Normal identifiable portions of the appendix. VASCULAR/LYMPHATIC: Aortoiliac vessels are normal in course and caliber. Moderate calcific atherosclerosis. No lymphadenopathy by CT size criteria. REPRODUCTIVE: Small hydro cells included scrotum. Prostate size is normal. OTHER: RIGHT lower quadrant fat stranding and hyperemia without focal abnormality. No intraperitoneal free fluid or free air. MUSCULOSKELETAL: Nonacute.  Severe L5-S1 degenerative disc. IMPRESSION: 1. Status post partial pancreatectomy and, splenectomy with expected postoperative change. 2. Fluid distended stomach. Given recent pancreatic surgery, this could reflect focal ileus or, obstruction. Patient may benefit from placement of nasogastric tube. 3. Partially imaged suspected esophagitis.  Mild gastritis. 4. RIGHT lower quadrant inflammation, possibly postoperative. Normal identifiable portions of the appendix. Aortic Atherosclerosis (ICD10-I70.0).  Electronically Signed   By: Elon Alas M.D.   On: 08/23/2017 23:26   Dg Chest Port 1 View  Result Date: 08/23/2017 CLINICAL DATA:  Initial evaluation for acute nausea, vomiting. EXAM: PORTABLE CHEST 1 VIEW COMPARISON:  Prior radiograph from 08/10/2015. FINDINGS: The cardiac and mediastinal silhouettes are stable in size and contour, and remain within normal limits. Aortic atherosclerosis. The lungs are normally inflated. No airspace consolidation, pleural effusion, or pulmonary edema is identified. There is no pneumothorax. No acute osseous abnormality identified. IMPRESSION: 1. No active cardiopulmonary disease. 2. Aortic  atherosclerosis. Electronically Signed   By: Jeannine Boga M.D.   On: 08/23/2017 21:40    EKG:   Orders placed or performed during the hospital encounter of 08/23/17  . EKG 12-Lead  . EKG 12-Lead  . ED EKG  . ED EKG  . EKG 12-Lead  . EKG 12-Lead    IMPRESSION AND PLAN:  Principal Problem:   Ileus ([place]) - as denoted on CT abdomen, though there is some suspicion this could potentially just be gastroparesis versus full on ileus. Gastric emptying study ordered. IV Reglan ordered. Active Problems:   Atrial flutter ([place]) - patient went into an episode of a flutter here in the ED tonight, he states he has no prior diagnosis of the same so this would be a new diagnosis for him. Echocardiogram and cardiology consult ordered   Diabetes [facility]) - patient was initially on insulin drip, though I do not suspect he was in true DKA, as he had multiple factors contributing to his electrolyte derangements. We have transition him off of the insulin drip, will use sliding scale insulin with corresponding glucose checks   CAD (coronary artery disease) - continue home meds   HTN (hypertension) -home medications  All the records are reviewed and case discussed with ED provider. Management plans discussed with the patient and/or family.  DVT PROPHYLAXIS: SubQ lovenox  GI PROPHYLAXIS: None  ADMISSION STATUS: Observation  CODE STATUS: Full Code Status History    Date Active Date Inactive Code Status Order ID Comments User Context   08/10/2015  8:22 AM 08/11/2015  7:47 PM Full Code [id number]  Harrie Foreman, MD Inpatient      TOTAL TIME TAKING CARE OF THIS PATIENT: 45 minutes.   Synia Douglass [place] 08/24/2017, 1:13 AM  [employer]  [phone]  CC: Primary care physician; Patient, No Pcp Per  Note:  This document was prepared using Dragon voice recognition software and may include unintentional dictation errors.

## 2017-08-24 NOTE — Consult Note (Signed)
Richard [name] is a 61 y.o. male  [id number]  Primary Cardiologist: Neoma Laming Reason for Consultation: Atrial fibrillation  HPI: This is 61 year old African-American male with a past medical history of hypertension CHF presented to the hospital with abdominal pain and was found to have ileus. Patient basically has occasional chest pain and shortness of breath but presented with nausea vomiting. He was found to have atrial fibrillation on EKG thus I was asked to evaluate the patient.   Review of Systems: Patient had chest pain this morning   Past Medical History:  Diagnosis Date  . CHF (congestive heart failure) ([place])   . Chronic back pain   . Chronic leg pain   . Coronary artery disease   . Diabetes mellitus without complication ([place])   . Hypertension   . Neuropathy     Medications Prior to Admission  Medication Sig Dispense Refill  . aspirin EC 81 MG tablet Take 1 tablet (81 mg total) by mouth daily. 30 tablet 0  . atorvastatin (LIPITOR) 40 MG tablet Take 40 mg by mouth every evening.    . enoxaparin (LOVENOX) 40 MG/0.4ML injection Inject 40 mg into the skin daily.    Marland Kitchen gabapentin (NEURONTIN) 800 MG tablet Take 800 mg by mouth 3 (three) times daily.    Marland Kitchen HYDROmorphone (DILAUDID) 2 MG tablet Take 2 mg by mouth every 6 (six) hours as needed for pain.    Marland Kitchen insulin glargine (LANTUS) 100 UNIT/ML injection Inject 0.2 mLs (20 Units total) into the skin at bedtime. Sliding scale. Max out at 50 units (Patient taking differently: Inject 20 Units into the skin daily. Sliding scale. Max out at 50 units) 10 mL 0  . insulin lispro (HUMALOG) 100 UNIT/ML injection Inject 3 Units into the skin 3 (three) times daily with meals.    . metFORMIN (GLUCOPHAGE) 500 MG tablet Take 500 mg by mouth 2 (two) times daily with a meal.    . metoCLOPramide (REGLAN) 10 MG tablet Take 10 mg by mouth 3 (three) times daily as needed for nausea.    . metoprolol tartrate (LOPRESSOR) 25 MG tablet Take 0.5 tablets  (12.5 mg total) by mouth 2 (two) times daily. (Patient taking differently: Take 25 mg by mouth 2 (two) times daily. ) 30 tablet 0  . ondansetron (ZOFRAN) 4 MG tablet Take 4 mg by mouth every 8 (eight) hours as needed for nausea or vomiting.    . pantoprazole (PROTONIX) 40 MG tablet Take 40 mg by mouth 2 (two) times daily.    . cyclobenzaprine (FLEXERIL) 10 MG tablet Take 1 tablet (10 mg total) by mouth 3 (three) times daily as needed for muscle spasms. (Patient not taking: Reported on 08/23/2017) 12 tablet 0  . gabapentin (NEURONTIN) 600 MG tablet Take 1 tablet (600 mg total) by mouth 3 (three) times daily. 50 tablet 0  . GLOBAL EASE INJECT PEN NEEDLES 31G X 5 MM MISC     . HYDROcodone-acetaminophen (NORCO) 5-325 MG tablet Take 1 tablet by mouth every 4 (four) hours as needed for moderate pain. (Patient not taking: Reported on 08/23/2017) 6 tablet 0  . insulin regular (NOVOLIN R,HUMULIN R) 100 units/mL injection Inject 0.08 mLs (8 Units total) into the skin 3 (three) times daily before meals. (Patient not taking: Reported on 08/23/2017) 10 mL 11  . Needles & Syringes MISC Use as directed 100 each 3  . pravastatin (PRAVACHOL) 80 MG tablet Take 1 tablet (80 mg total) by mouth at bedtime. (Patient not  taking: Reported on 08/23/2017) 30 tablet 0     . enoxaparin (LOVENOX) injection  40 mg Subcutaneous Q24H  . insulin aspart  0-9 Units Subcutaneous Q6H  . insulin glargine  10 Units Subcutaneous Daily  . metoCLOPramide (REGLAN) injection  5 mg Intravenous Q6H    Infusions: . sodium chloride 75 mL/hr at 08/24/17 0258  . piperacillin-tazobactam (ZOSYN)  IV 3.375 g (08/24/17 0653)  . vancomycin      No Known Allergies  Social History   Social History  . Marital status: Single    Spouse name: N/A  . Number of children: N/A  . Years of education: N/A   Occupational History  . Not on file.   Social History Main Topics  . Smoking status: Current Every Day Smoker    Packs/day: 0.50  .  Smokeless tobacco: Never Used  . Alcohol use No  . 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 Sexual activity: Not on file   Other Topics Concern  . Not on file   Social History Narrative  . No narrative on file    Family History  Problem Relation Age of Onset  . CAD Mother   . Diabetes Mellitus II Father     PHYSICAL EXAM: Vitals:   08/24/17 0242 08/24/17 0454  BP: (!) 136/56 (!) 156/70  Pulse: (!) 110 (!) 116  Resp: (!) 24 (!) 22  Temp: (!) 97.2 F (36.2 C) 98.8 F (37.1 C)  SpO2: 100% 98%     Intake/Output Summary (Last 24 hours) at 08/24/17 0935 Last data filed at 08/24/17 0547  Gross per 24 hour  Intake          2761.25 ml  Output              700 ml  Net          2061.25 ml    General:  Well appearing. No respiratory difficulty HEENT: normal Neck: supple. no JVD. Carotids 2+ bilat; no bruits. No lymphadenopathy or thryomegaly appreciated. Cor: PMI nondisplaced. Regular rate & rhythm. No rubs, gallops or murmurs. Lungs: clear Abdomen: soft, nontender, nondistended. No hepatosplenomegaly. No bruits or masses. Good bowel sounds. Extremities: no cyanosis, clubbing, rash, edema Neuro: alert & oriented x 3, cranial nerves grossly intact. moves all 4 extremities w/o difficulty. Affect pleasant.  ECG: Atrial fibrillation with rapid ventricular response rate  Results for orders placed or performed during the hospital encounter of 08/23/17 (from the past 24 hour(s))  Glucose, capillary     Status: Abnormal   Collection Time: 08/23/17  6:23 PM  Result Value Ref Range   Glucose-Capillary 335 (H) 65 - 99 mg/dL  Basic metabolic panel     Status: Abnormal   Collection Time: 08/23/17  6:38 PM  Result Value Ref Range   Sodium 136 135 - 145 mmol/L   Potassium 4.6 3.5 - 5.1 mmol/L   Chloride 95 (L) 101 - 111 mmol/L   CO2 19 (L) 22 - 32 mmol/L   Glucose, Bld 357 (H) 65 - 99 mg/dL   BUN 41 (H) 6 - 20 mg/dL   Creatinine, Ser 1.24 0.61  - 1.24 mg/dL   Calcium 10.3 8.9 - 10.3 mg/dL   GFR calc non Af Amer >60 >60 mL/min   GFR calc Af Amer >60 >60 mL/min   Anion gap 22 (H) 5 - 15  CBC     Status: Abnormal  Collection Time: 08/23/17  6:38 PM  Result Value Ref Range   WBC 25.1 (H) 3.8 - 10.6 K/uL   RBC 4.80 4.40 - 5.90 MIL/uL   Hemoglobin 13.7 13.0 - 18.0 g/dL   HCT 42.9 40.0 - 52.0 %   MCV 89.4 80.0 - 100.0 fL   MCH 28.5 26.0 - 34.0 pg   MCHC 31.9 (L) 32.0 - 36.0 g/dL   RDW 15.1 (H) 11.5 - 14.5 %   Platelets 471 (H) 150 - 440 K/uL  Lactic acid, plasma     Status: Abnormal   Collection Time: 08/23/17  6:39 PM  Result Value Ref Range   Lactic Acid, Venous 2.1 (HH) 0.5 - 1.9 mmol/L  Blood gas, venous     Status: Abnormal   Collection Time: 08/23/17  6:43 PM  Result Value Ref Range   pH, Ven 7.36 7.250 - 7.430   pCO2, Ven 35 (L) 44.0 - 60.0 mmHg   pO2, Ven 39.0 32.0 - 45.0 mmHg   Bicarbonate 19.8 (L) 20.0 - 28.0 mmol/L   Acid-base deficit 4.9 (H) 0.0 - 2.0 mmol/L   O2 Saturation 71.0 %   Patient temperature 37.0    Collection site VENOUS    Sample type VENOUS   Urinalysis, Complete w Microscopic     Status: Abnormal   Collection Time: 08/23/17  8:35 PM  Result Value Ref Range   Color, Urine YELLOW (A) YELLOW   APPearance CLEAR (A) CLEAR   Specific Gravity, Urine 1.029 1.005 - 1.030   pH 6.0 5.0 - 8.0   Glucose, UA >=500 (A) NEGATIVE mg/dL   Hgb urine dipstick NEGATIVE NEGATIVE   Bilirubin Urine NEGATIVE NEGATIVE   Ketones, ur 80 (A) NEGATIVE mg/dL   Protein, ur 100 (A) NEGATIVE mg/dL   Nitrite NEGATIVE NEGATIVE   Leukocytes, UA NEGATIVE NEGATIVE   RBC / HPF 0-5 0 - 5 RBC/hpf   WBC, UA 0-5 0 - 5 WBC/hpf   Bacteria, UA NONE SEEN NONE SEEN   Squamous Epithelial / LPF NONE SEEN NONE SEEN   Mucus PRESENT    Hyaline Casts, UA PRESENT   Lactic acid, plasma     Status: None   Collection Time: 08/23/17 10:16 PM  Result Value Ref Range   Lactic Acid, Venous 1.5 0.5 - 1.9 mmol/L  Glucose, capillary     Status:  Abnormal   Collection Time: 08/23/17 10:17 PM  Result Value Ref Range   Glucose-Capillary 308 (H) 65 - 99 mg/dL   Comment 1 Notify RN    Comment 2 Document in Chart    Comment 3 Call MD NNP PA CNM   Basic metabolic panel     Status: Abnormal   Collection Time: 08/23/17 11:25 PM  Result Value Ref Range   Sodium 137 135 - 145 mmol/L   Potassium 5.1 3.5 - 5.1 mmol/L   Chloride 105 101 - 111 mmol/L   CO2 18 (L) 22 - 32 mmol/L   Glucose, Bld 308 (H) 65 - 99 mg/dL   BUN 30 (H) 6 - 20 mg/dL   Creatinine, Ser 1.13 0.61 - 1.24 mg/dL   Calcium 7.8 (L) 8.9 - 10.3 mg/dL   GFR calc non Af Amer >60 >60 mL/min   GFR calc Af Amer >60 >60 mL/min   Anion gap 14 5 - 15  Troponin I     Status: Abnormal   Collection Time: 08/23/17 11:25 PM  Result Value Ref Range   Troponin I 0.03 (HH) <0.03 ng/mL  Glucose, capillary     Status: Abnormal   Collection Time: 08/23/17 11:45 PM  Result Value Ref Range   Glucose-Capillary 326 (H) 65 - 99 mg/dL  Glucose, capillary     Status: Abnormal   Collection Time: 08/24/17 12:32 AM  Result Value Ref Range   Glucose-Capillary 301 (H) 65 - 99 mg/dL  Potassium     Status: None   Collection Time: 08/24/17 12:36 AM  Result Value Ref Range   Potassium 4.5 3.5 - 5.1 mmol/L  Glucose, capillary     Status: Abnormal   Collection Time: 08/24/17  1:28 AM  Result Value Ref Range   Glucose-Capillary 340 (H) 65 - 99 mg/dL  Glucose, capillary     Status: Abnormal   Collection Time: 08/24/17  2:55 AM  Result Value Ref Range   Glucose-Capillary 298 (H) 65 - 99 mg/dL  Basic metabolic panel     Status: Abnormal   Collection Time: 08/24/17  5:57 AM  Result Value Ref Range   Sodium 138 135 - 145 mmol/L   Potassium 4.2 3.5 - 5.1 mmol/L   Chloride 103 101 - 111 mmol/L   CO2 17 (L) 22 - 32 mmol/L   Glucose, Bld 324 (H) 65 - 99 mg/dL   BUN 27 (H) 6 - 20 mg/dL   Creatinine, Ser 1.05 0.61 - 1.24 mg/dL   Calcium 9.0 8.9 - 10.3 mg/dL   GFR calc non Af Amer >60 >60 mL/min   GFR  calc Af Amer >60 >60 mL/min   Anion gap 18 (H) 5 - 15  CBC     Status: Abnormal   Collection Time: 08/24/17  5:57 AM  Result Value Ref Range   WBC 21.7 (H) 3.8 - 10.6 K/uL   RBC 4.35 (L) 4.40 - 5.90 MIL/uL   Hemoglobin 12.4 (L) 13.0 - 18.0 g/dL   HCT 39.1 (L) 40.0 - 52.0 %   MCV 89.8 80.0 - 100.0 fL   MCH 28.5 26.0 - 34.0 pg   MCHC 31.8 (L) 32.0 - 36.0 g/dL   RDW 15.1 (H) 11.5 - 14.5 %   Platelets 391 150 - 440 K/uL  Glucose, capillary     Status: Abnormal   Collection Time: 08/24/17  6:17 AM  Result Value Ref Range   Glucose-Capillary 321 (H) 65 - 99 mg/dL   Ct Abdomen Pelvis W Contrast  Result Date: 08/23/2017 CLINICAL DATA:  Nausea, vomiting, suspected dehydration. Pancreatic and splenic surgery for pancreatic cancer July 31, 2017. Recent toe amputation. History of diabetes. EXAM: CT ABDOMEN AND PELVIS WITH CONTRAST TECHNIQUE: Multidetector CT imaging of the abdomen and pelvis was performed using the standard protocol following bolus administration of intravenous contrast. CONTRAST:  141mL ISOVUE-300 IOPAMIDOL (ISOVUE-300) INJECTION 61% COMPARISON:  CT abdomen and pelvis December 07, 2016 FINDINGS: LOWER CHEST: Lung bases are clear. Included heart size is normal. No pericardial effusion. HEPATOBILIARY: Diffusely hypodense liver suggesting steatosis. Mild focal fatty sparing about the gallbladder fossa. Normal gallbladder. PANCREAS: Status pancreatectomy at proximal body. SPLEEN: Status post splenectomy. ADRENALS/URINARY TRACT: Kidneys are orthotopic, demonstrating symmetric enhancement. No nephrolithiasis, hydronephrosis or solid renal masses. Too small to characterize hypodensities LEFT kidney. The unopacified ureters are normal in course and caliber. Delayed imaging through the kidneys demonstrates symmetric prompt contrast excretion within the proximal urinary collecting system. Urinary bladder is well distended and unremarkable. Normal adrenal glands. STOMACH/BOWEL: Thickened edematous  distal esophagus. Fluid distended stomach with gastric antral wall thickening and inflammation. The small and large bowel are normal  in course and caliber without inflammatory changes, sensitivity decreased without oral contrast (minimal distal small bowel density could reflect retained oral contrast. Normal identifiable portions of the appendix. VASCULAR/LYMPHATIC: Aortoiliac vessels are normal in course and caliber. Moderate calcific atherosclerosis. No lymphadenopathy by CT size criteria. REPRODUCTIVE: Small hydro cells included scrotum. Prostate size is normal. OTHER: RIGHT lower quadrant fat stranding and hyperemia without focal abnormality. No intraperitoneal free fluid or free air. MUSCULOSKELETAL: Nonacute.  Severe L5-S1 degenerative disc. IMPRESSION: 1. Status post partial pancreatectomy and, splenectomy with expected postoperative change. 2. Fluid distended stomach. Given recent pancreatic surgery, this could reflect focal ileus or, obstruction. Patient may benefit from placement of nasogastric tube. 3. Partially imaged suspected esophagitis.  Mild gastritis. 4. RIGHT lower quadrant inflammation, possibly postoperative. Normal identifiable portions of the appendix. Aortic Atherosclerosis (ICD10-I70.0). Electronically Signed   By: Elon Alas M.D.   On: 08/23/2017 23:26   Dg Chest Port 1 View  Result Date: 08/23/2017 CLINICAL DATA:  Initial evaluation for acute nausea, vomiting. EXAM: PORTABLE CHEST 1 VIEW COMPARISON:  Prior radiograph from 08/10/2015. FINDINGS: The cardiac and mediastinal silhouettes are stable in size and contour, and remain within normal limits. Aortic atherosclerosis. The lungs are normally inflated. No airspace consolidation, pleural effusion, or pulmonary edema is identified. There is no pneumothorax. No acute osseous abnormality identified. IMPRESSION: 1. No active cardiopulmonary disease. 2. Aortic atherosclerosis. Electronically Signed   By: Jeannine Boga M.D.    On: 08/23/2017 21:40     ASSESSMENT AND PLAN: Atrial fibrillation with rapid ventricular response rate. Advise controlling the rate with diltiazem IV as well as metoprolol 5 mg IV every 66 hours when necessary. Also advise getting echocardiogram to evaluate ejection fraction with continued follow the patient with you.  Ruba Outen A

## 2017-08-25 ENCOUNTER — Other Ambulatory Visit: Payer: Self-pay

## 2017-08-25 ENCOUNTER — Inpatient Hospital Stay: Payer: Medicare Other

## 2017-08-25 LAB — GLUCOSE, CAPILLARY
GLUCOSE-CAPILLARY: 122 mg/dL — AB (ref 65–99)
GLUCOSE-CAPILLARY: 153 mg/dL — AB (ref 65–99)
GLUCOSE-CAPILLARY: 173 mg/dL — AB (ref 65–99)
Glucose-Capillary: 139 mg/dL — ABNORMAL HIGH (ref 65–99)
Glucose-Capillary: 196 mg/dL — ABNORMAL HIGH (ref 65–99)
Glucose-Capillary: 191 mg/dL — ABNORMAL HIGH (ref 65–99)
Glucose-Capillary: 167 mg/dL — ABNORMAL HIGH (ref 65–99)

## 2017-08-25 LAB — CBC
HEMATOCRIT: 40.3 % (ref 40.0–52.0)
HEMOGLOBIN: 13.1 g/dL (ref 13.0–18.0)
MCH: 28.7 pg (ref 26.0–34.0)
MCHC: 32.5 g/dL (ref 32.0–36.0)
MCV: 88.5 fL (ref 80.0–100.0)
Platelets: 363 10*3/uL (ref 150–440)
RBC: 4.55 MIL/uL (ref 4.40–5.90)
RDW: 15.2 % — AB (ref 11.5–14.5)
WBC: 20.9 10*3/uL — AB (ref 3.8–10.6)

## 2017-08-25 LAB — COMPREHENSIVE METABOLIC PANEL
ALK PHOS: 26 U/L — AB (ref 38–126)
ALT: 15 U/L — ABNORMAL LOW (ref 17–63)
AST: 16 U/L (ref 15–41)
Albumin: 3.7 g/dL (ref 3.5–5.0)
Anion gap: 12 (ref 5–15)
BILIRUBIN TOTAL: 1.1 mg/dL (ref 0.3–1.2)
BUN: 25 mg/dL — AB (ref 6–20)
CALCIUM: 9.5 mg/dL (ref 8.9–10.3)
CO2: 33 mmol/L — ABNORMAL HIGH (ref 22–32)
CREATININE: 0.78 mg/dL (ref 0.61–1.24)
Chloride: 99 mmol/L — ABNORMAL LOW (ref 101–111)
GFR calc Af Amer: >60 mL/min (ref >60)
GLUCOSE: 201 mg/dL — AB (ref 65–99)
POTASSIUM: 2.8 mmol/L — AB (ref 3.5–5.1)
Sodium: 144 mmol/L (ref 135–145)
TOTAL PROTEIN: 7.6 g/dL (ref 6.5–8.1)

## 2017-08-25 MED ORDER — METOPROLOL TARTRATE 5 MG/5ML IV SOLN: 2.5000 mg | INTRAVENOUS | Status: DC | PRN

## 2017-08-25 MED ORDER — LACTATED RINGERS IV SOLN
INTRAVENOUS | Status: DC
  Administered 2017-08-25 – 2017-08-26 (×4): via INTRAVENOUS

## 2017-08-25 NOTE — Progress Notes (Signed)
CC: Abdominal pain Subjective: Patient transferred to the ICU yesterday secondary to A. fib with RVR.  Patient continues to claim he does not feel well but does not provide any specific complaints.  Heart rate has been much better controlled in the ICU.  NG tube continues to have high output.  Objective: Vital signs in last 24 hours: Temp:  [97.5 F (36.4 C)-99.8 F (37.7 C)] 97.7 F (36.5 C) (11/04 0400) Pulse Rate:  [53-116] 114 (11/04 0600) Resp:  [7-31] 7 (11/04 0600) BP: (107-156)/(50-118) 124/93 (11/04 0600) SpO2:  [97 %-100 %] 97 % (11/04 0600) Weight:  [62.8 kg (138 lb 7.2 oz)] 62.8 kg (138 lb 7.2 oz) (11/03 1141) Last BM Date: 08/23/17  Intake/Output from previous day: 11/03 0701 - 11/04 0700 In: 953.8 [I.V.:353.8; IV Piggyback:600] Out: 2050 [Urine:350; Emesis/NG output:1700] Intake/Output this shift: No intake/output data recorded.  Physical exam:  General: No acute distress Chest: Clear to auscultation Heart: Irregularly irregular tachycardic Abdomen: Soft nondistended with a healing midline wound from his recent surgery at [facility]  Lab Results: CBC  Recent Labs    08/24/17 0557 08/25/17 0511  WBC 21.7* 20.9*  HGB 12.4* 13.1  HCT 39.1* 40.3  PLT 391 363   BMET Recent Labs    08/24/17 0557 08/25/17 0511  NA 138 144  K 4.2 2.8*  CL 103 99*  CO2 17* 33*  GLUCOSE 324* 201*  BUN 27* 25*  CREATININE 1.05 0.78  CALCIUM 9.0 9.5   PT/INR No results for input(s): LABPROT, INR in the last 72 hours. ABG Recent Labs    08/23/17 1843  HCO3 19.8*    Studies/Results: Ct Abdomen Pelvis W Contrast  Result Date: 08/23/2017 CLINICAL DATA:  Nausea, vomiting, suspected dehydration. Pancreatic and splenic surgery for pancreatic cancer July 31, 2017. Recent toe amputation. History of diabetes. EXAM: CT ABDOMEN AND PELVIS WITH CONTRAST TECHNIQUE: Multidetector CT imaging of the abdomen and pelvis was performed using the standard protocol following bolus  administration of intravenous contrast. CONTRAST:  162mL ISOVUE-300 IOPAMIDOL (ISOVUE-300) INJECTION 61% COMPARISON:  CT abdomen and pelvis December 07, 2016 FINDINGS: LOWER CHEST: Lung bases are clear. Included heart size is normal. No pericardial effusion. HEPATOBILIARY: Diffusely hypodense liver suggesting steatosis. Mild focal fatty sparing about the gallbladder fossa. Normal gallbladder. PANCREAS: Status pancreatectomy at proximal body. SPLEEN: Status post splenectomy. ADRENALS/URINARY TRACT: Kidneys are orthotopic, demonstrating symmetric enhancement. No nephrolithiasis, hydronephrosis or solid renal masses. Too small to characterize hypodensities LEFT kidney. The unopacified ureters are normal in course and caliber. Delayed imaging through the kidneys demonstrates symmetric prompt contrast excretion within the proximal urinary collecting system. Urinary bladder is well distended and unremarkable. Normal adrenal glands. STOMACH/BOWEL: Thickened edematous distal esophagus. Fluid distended stomach with gastric antral wall thickening and inflammation. The small and large bowel are normal in course and caliber without inflammatory changes, sensitivity decreased without oral contrast (minimal distal small bowel density could reflect retained oral contrast. Normal identifiable portions of the appendix. VASCULAR/LYMPHATIC: Aortoiliac vessels are normal in course and caliber. Moderate calcific atherosclerosis. No lymphadenopathy by CT size criteria. REPRODUCTIVE: Small hydro cells included scrotum. Prostate size is normal. OTHER: RIGHT lower quadrant fat stranding and hyperemia without focal abnormality. No intraperitoneal free fluid or free air. MUSCULOSKELETAL: Nonacute.  Severe L5-S1 degenerative disc. IMPRESSION: 1. Status post partial pancreatectomy and, splenectomy with expected postoperative change. 2. Fluid distended stomach. Given recent pancreatic surgery, this could reflect focal ileus or, obstruction.  Patient may benefit from placement of nasogastric tube. 3. Partially imaged  suspected esophagitis.  Mild gastritis. 4. RIGHT lower quadrant inflammation, possibly postoperative. Normal identifiable portions of the appendix. Aortic Atherosclerosis (ICD10-I70.0). Electronically Signed   By: Elon Alas M.D.   On: 08/23/2017 23:26   Dg Chest Port 1 View  Result Date: 08/23/2017 CLINICAL DATA:  Initial evaluation for acute nausea, vomiting. EXAM: PORTABLE CHEST 1 VIEW COMPARISON:  Prior radiograph from 08/10/2015. FINDINGS: The cardiac and mediastinal silhouettes are stable in size and contour, and remain within normal limits. Aortic atherosclerosis. The lungs are normally inflated. No airspace consolidation, pleural effusion, or pulmonary edema is identified. There is no pneumothorax. No acute osseous abnormality identified. IMPRESSION: 1. No active cardiopulmonary disease. 2. Aortic atherosclerosis. Electronically Signed   By: Jeannine Boga M.D.   On: 08/23/2017 21:40    Anti-infectives: Anti-infectives (From admission, onward)   Start     Dose/Rate Route Frequency Ordered Stop   08/24/17 1200  vancomycin (VANCOCIN) IVPB 1000 mg/200 mL premix     1,000 mg 200 mL/hr over 60 Minutes Intravenous Every 18 hours 08/23/17 2308     08/24/17 0600  piperacillin-tazobactam (ZOSYN) IVPB 3.375 g     3.375 g 12.5 mL/hr over 240 Minutes Intravenous Every 8 hours 08/23/17 2308     08/23/17 2130  piperacillin-tazobactam (ZOSYN) IVPB 3.375 g     3.375 g 100 mL/hr over 30 Minutes Intravenous  Once 08/23/17 2118 08/23/17 2259   08/23/17 2130  vancomycin (VANCOCIN) IVPB 1000 mg/200 mL premix     1,000 mg 200 mL/hr over 60 Minutes Intravenous  Once 08/23/17 2118 08/24/17 0300      Assessment/Plan:  61 year old male admitted to the medicine service 3-1/2 weeks status post partial pancreatic resection for pancreatic cancer with splenectomy and pancreatic duct to jejunal anastomosis.  High output NG  tube overnight.  Plain film x-rays ordered this morning which did not show any obvious obstructive pattern per my interpretation.  Recommend continuing NG tube decompression due to the high output.  He will eventually need contrasted studies to evaluate the patency of his anastomosis.  Continues to be unclear what the etiology of his leukocytosis is at this time.  General surgery will continue to follow with you, should he deteriorate to the point of needing surgical intervention transfer to [facility] would be recommended.  Crosby Bevan T. Adonis Huguenin, MD, [facility] General Surgeon [facility]  Day [facility] [phone] Night [facility] [id number] 08/25/2017

## 2017-08-25 NOTE — Progress Notes (Signed)
Initial Nutrition Assessment  DOCUMENTATION CODES:   Severe malnutrition in context of acute illness/injury  INTERVENTION:  Will monitor for diet advancement. If diet unable to be advanced within 3 days (10/7) will need to consider TPN at that time as patient reports he has already been approximately 7 days without eating.  When diet able to be advanced recommend providing Ensure Enlive po TID, each supplement provides 350 kcal and 20 grams of protein.  Patient may also benefit from mechanically altered diet (dysphagia 3/mechanical soft) with diet advancement in setting of his reported difficulty chewing. Will continue to monitor.  Will manage bowel characteristics with diet advancement as distal pancreatectomy can lead to pancreatic exocrine insufficiency.   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  GOAL:   Patient will meet greater than or equal to 90% of their needs  MONITOR:   Diet advancement, PO intake, Labs, Weight trends, I & O's, Skin  REASON FOR ASSESSMENT:   Malnutrition Screening Tool    ASSESSMENT:   61 year old male with PMHx of HTN, DM type 2, neuropathy, CAD s/p coronary angioplasty with stent, CHF, chronic back pain, chronic leg pain, hx pancreatic cancer s/p distal pancreatectomy + splenectomy + excisional liver biopsy (negative) on 07/31/2017 (pathology showed T2N0 pancreatic cancer with negative margins; stage IB) who presented with N/V and abdominal pain admitted with suspected ileus, also found to be in A. Fib with RVR.   Met with patient at bedside. He was turned on his side and reports he is not feeling well. Patient unable to provide very specific history since he reports he is not feeling well, but is able to answer some questions.  He reports he has had a poor appetite for a while now, even before he had his surgery at [facility], but he cannot recall when it started. Initially after surgery he started eating again and was able to have soups and salads, but lately his intake has decreased. He reports he has not eaten a meal for approximately one meal now in setting of his N/V and abdominal pain. Attempted to get exact date of last meal, but patient cannot recall. Patient reports he has some difficulty chewing at baseline. Will address once diet able to be advanced.  He reports his UBW was about 170 lbs. Per review of weight history in Care Everywhere patient was 162.5 lbs on 07/02/2017. He has lost approximately 24 lbs (14.8% body weight) over 2 months, which is significant for time frame.  16 Fr. NGT to LIS with 1700 mL output yesterday. So far today patient had 850 mL output documented early this morning and RN reports patient had another 1000 mL output. Current output appears to be dark red.  Medications reviewed and include: Novolog 0-15 units Q4hrs, Lantus 10 units daily, Reglan 5 mg Q6hrs, LR @ 75 mL/hr, Zosyn, amiodarone.  Labs reviewed: CBG 139-208, Potassium 2.8, Chloride 99, CO2 33, BUN 25.  Discussed with RN. Abdominal x-ray is improved today.  NUTRITION - FOCUSED PHYSICAL EXAM:    Most Recent Value  Orbital Region  Severe depletion  Upper Arm Region  Severe depletion  Thoracic and Lumbar Region  Severe depletion  Buccal Region  Severe depletion  Temple Region  Severe depletion  Clavicle Bone Region  Moderate depletion  Clavicle and Acromion Bone Region  Moderate  depletion  Scapular Bone Region  Moderate depletion  Dorsal Hand  Moderate depletion  Patellar Region  Severe depletion  Anterior Thigh Region  Severe depletion  Posterior Calf Region  Moderate depletion  Edema (RD Assessment)  None  Hair  Reviewed  Eyes  Reviewed  Mouth  Reviewed  Skin  Reviewed [s/p amputation right transmetatarsal]  Nails   Reviewed      Diet Order:  Diet NPO time specified  EDUCATION NEEDS:   No education needs have been identified at this time  Skin:  Skin Assessment: Skin Integrity Issues: Skin Integrity Issues:: Incisions, Other (Comment) Incisions: closed incision to abdomen Other: s/p amputation of right transmetatarsal  Last BM:  08/23/2017  Height:   Ht Readings from Last 1 Encounters:  08/24/17 _0  (1.803 m)    Weight:   Wt Readings from Last 1 Encounters:  08/24/17 138 lb 7.2 oz (62.8 kg)    Ideal Body Weight:  78.2 kg  BMI:  Body mass index is 19.31 kg/m.  Estimated Nutritional Needs:   Kcal:  1890-2180 (MSJ x 1.3-1.5)  Protein:  80-95 grams (1.3-1.5 grams/kg)  Fluid:  1.9-2.2 L/day (30-35 mL/kg)  Willey Blade, MS, RD, LDN Office: [phone] Pager: [phone] After Hours/Weekend Pager: [phone]

## 2017-08-25 NOTE — Progress Notes (Signed)
SUBJECTIVE: Patient is still feeling weak and short of breath   Vitals:   08/25/17 0700 08/25/17 0800 08/25/17 0900 08/25/17 1000  BP: 113/66 (!) 161/88 (!) 156/72 (!) 143/75  Pulse: 97 94 (!) 112 86  Resp: 12 (!) 8 20 10   Temp:      TempSrc:      SpO2: 99% 97% 100% 99%  Weight:      Height:        Intake/Output Summary (Last 24 hours) at 08/25/2017 1123 Last data filed at 08/25/2017 0516 Gross per 24 hour  Intake 953.75 ml  Output 2050 ml  Net -1096.25 ml    LABS: Basic Metabolic Panel: Recent Labs    08/24/17 0557 08/25/17 0511  NA 138 144  K 4.2 2.8*  CL 103 99*  CO2 17* 33*  GLUCOSE 324* 201*  BUN 27* 25*  CREATININE 1.05 0.78  CALCIUM 9.0 9.5   Liver Function Tests: Recent Labs    08/24/17 0557 08/25/17 0511  AST 17 16  ALT 18 15*  ALKPHOS 25* 26*  BILITOT 1.8* 1.1  PROT 7.2 7.6  ALBUMIN 3.7 3.7   Recent Labs    08/24/17 0557  LIPASE 15  AMYLASE 189*   CBC: Recent Labs    08/24/17 0557 08/25/17 0511  WBC 21.7* 20.9*  HGB 12.4* 13.1  HCT 39.1* 40.3  MCV 89.8 88.5  PLT 391 363   Cardiac Enzymes: Recent Labs    08/23/17 2325  TROPONINI 0.03*   BNP: Invalid input(s): POCBNP D-Dimer: No results for input(s): DDIMER in the last 72 hours. Hemoglobin A1C: Recent Labs    08/24/17 0557  HGBA1C 8.8*   Fasting Lipid Panel: No results for input(s): CHOL, HDL, LDLCALC, TRIG, CHOLHDL, LDLDIRECT in the last 72 hours. Thyroid Function Tests: No results for input(s): TSH, T4TOTAL, T3FREE, THYROIDAB in the last 72 hours.  Invalid input(s): FREET3 Anemia Panel: No results for input(s): VITAMINB12, FOLATE, FERRITIN, TIBC, IRON, RETICCTPCT in the last 72 hours.   PHYSICAL EXAM General: Well developed, well nourished, in no acute distress HEENT:  Normocephalic and atramatic Neck:  No JVD.  Lungs: Clear bilaterally to auscultation and percussion. Heart: HRRR . Normal S1 and S2 without gallops or murmurs.  Abdomen: Bowel sounds are  positive, abdomen soft and non-tender  Msk:  Back normal, normal gait. Normal strength and tone for age. Extremities: No clubbing, cyanosis or edema.   Neuro: Alert and oriented X 3. Psych:  Good affect, responds appropriately  TELEMETRY: Atrial fibrillation with ventricular rate about 105  ASSESSMENT AND PLAN: Atrial fibrillation with rapid ventricular response rate on amiodarone drip with paralytic ileus. Will continue IV amiodarone.  Principal Problem:   Ileus ([place]) Active Problems:   Diabetes ([address])   Atrial flutter (HCC)   CAD (coronary artery disease)   HTN (hypertension)   Atrial fibrillation (HCC)    Neoma Laming A, MD, [facility] 08/25/2017 11:23 AM

## 2017-08-25 NOTE — Progress Notes (Signed)
[place] [facility] [place] NAME: Richard [name]    MR#:  [id number]  DATE OF BIRTH:  1956/07/15  SUBJECTIVE:   Patient remains in A. fib but heart rates are improved. Now on amiodarone drip. He'll having significant output from the NG tube but x-ray this morning showing no evidence of ileus.  REVIEW OF SYSTEMS:    Review of Systems  Constitutional: Negative for fever and weight loss.  HENT: Negative for congestion, nosebleeds and tinnitus.   Eyes: Negative for blurred vision, double vision and redness.  Respiratory: Negative for cough, hemoptysis and shortness of breath.   Cardiovascular: Negative for chest pain, orthopnea, leg swelling and PND.  Gastrointestinal: Positive for abdominal pain. Negative for diarrhea, melena, nausea and vomiting.  Genitourinary: Negative for dysuria, hematuria and urgency.  Musculoskeletal: Negative for falls and joint pain.  Neurological: Negative for dizziness, tingling, sensory change, focal weakness, seizures, weakness and headaches.  Endo/Heme/Allergies: Negative for polydipsia. Does not bruise/bleed easily.  Psychiatric/Behavioral: Negative for depression and memory loss. The patient is not nervous/anxious.     Nutrition: NPO Tolerating Diet: No Tolerating PT: Await Eval.   DRUG ALLERGIES:  No Known Allergies  VITALS:  Blood pressure (!) 161/106, pulse 92, temperature 97.7 F (36.5 C), temperature source Axillary, resp. rate (!) 9, height 5' 11"  (1.803 m), weight 62.8 kg (138 lb 7.2 oz), SpO2 95 %.  PHYSICAL EXAMINATION:   Physical Exam  GENERAL:  61 y.o.-year-old cachectic patient lying in bed in mild distress.  EYES: Pupils equal, round, reactive to light and accommodation. No scleral icterus. Extraocular muscles intact.  HEENT: Head atraumatic, normocephalic. NG tube in place with some coffee ground stuff draining.   NECK:  Supple, no jugular venous distention. No thyroid enlargement, no tenderness.   LUNGS: Normal breath sounds bilaterally, no wheezing, rales, rhonchi. No use of accessory muscles of respiration.  CARDIOVASCULAR: S1, S2 Irregular. No murmurs, rubs, or gallops.  ABDOMEN: Soft, nontender, nondistended. Bowel sounds present. No organomegaly or mass. + mid-abdomen dressing in place from recent surgery.  EXTREMITIES: No cyanosis, clubbing or edema b/l.    NEUROLOGIC: Cranial nerves II through XII are intact. No focal Motor or sensory deficits b/l.   PSYCHIATRIC: The patient is alert and oriented x 3.  SKIN: No obvious rash, lesion, or ulcer.    LABORATORY PANEL:   CBC Recent Labs  Lab 08/25/17 0511  WBC 20.9*  HGB 13.1  HCT 40.3  PLT 363   ------------------------------------------------------------------------------------------------------------------  Chemistries  Recent Labs  Lab 08/25/17 0511  NA 144  K 2.8*  CL 99*  CO2 33*  GLUCOSE 201*  BUN 25*  CREATININE 0.78  CALCIUM 9.5  AST 16  ALT 15*  ALKPHOS 26*  BILITOT 1.1   ------------------------------------------------------------------------------------------------------------------  Cardiac Enzymes Recent Labs  Lab 08/23/17 2325  TROPONINI 0.03*   ------------------------------------------------------------------------------------------------------------------  RADIOLOGY:  Ct Abdomen Pelvis W Contrast  Result Date: 08/23/2017 CLINICAL DATA:  Nausea, vomiting, suspected dehydration. Pancreatic and splenic surgery for pancreatic cancer July 31, 2017. Recent toe amputation. History of diabetes. EXAM: CT ABDOMEN AND PELVIS WITH CONTRAST TECHNIQUE: Multidetector CT imaging of the abdomen and pelvis was performed using the standard protocol following bolus administration of intravenous contrast. CONTRAST:  126m ISOVUE-300 IOPAMIDOL (ISOVUE-300) INJECTION 61% COMPARISON:  CT abdomen and pelvis December 07, 2016 FINDINGS: LOWER CHEST: Lung bases are clear. Included heart size is normal. No  pericardial effusion. HEPATOBILIARY: Diffusely hypodense liver suggesting steatosis. Mild focal fatty sparing about the  gallbladder fossa. Normal gallbladder. PANCREAS: Status pancreatectomy at proximal body. SPLEEN: Status post splenectomy. ADRENALS/URINARY TRACT: Kidneys are orthotopic, demonstrating symmetric enhancement. No nephrolithiasis, hydronephrosis or solid renal masses. Too small to characterize hypodensities LEFT kidney. The unopacified ureters are normal in course and caliber. Delayed imaging through the kidneys demonstrates symmetric prompt contrast excretion within the proximal urinary collecting system. Urinary bladder is well distended and unremarkable. Normal adrenal glands. STOMACH/BOWEL: Thickened edematous distal esophagus. Fluid distended stomach with gastric antral wall thickening and inflammation. The small and large bowel are normal in course and caliber without inflammatory changes, sensitivity decreased without oral contrast (minimal distal small bowel density could reflect retained oral contrast. Normal identifiable portions of the appendix. VASCULAR/LYMPHATIC: Aortoiliac vessels are normal in course and caliber. Moderate calcific atherosclerosis. No lymphadenopathy by CT size criteria. REPRODUCTIVE: Small hydro cells included scrotum. Prostate size is normal. OTHER: RIGHT lower quadrant fat stranding and hyperemia without focal abnormality. No intraperitoneal free fluid or free air. MUSCULOSKELETAL: Nonacute.  Severe L5-S1 degenerative disc. IMPRESSION: 1. Status post partial pancreatectomy and, splenectomy with expected postoperative change. 2. Fluid distended stomach. Given recent pancreatic surgery, this could reflect focal ileus or, obstruction. Patient may benefit from placement of nasogastric tube. 3. Partially imaged suspected esophagitis.  Mild gastritis. 4. RIGHT lower quadrant inflammation, possibly postoperative. Normal identifiable portions of the appendix. Aortic  Atherosclerosis (ICD10-I70.0). Electronically Signed   By: Elon Alas M.D.   On: 08/23/2017 23:26   Dg Chest Port 1 View  Result Date: 08/23/2017 CLINICAL DATA:  Initial evaluation for acute nausea, vomiting. EXAM: PORTABLE CHEST 1 VIEW COMPARISON:  Prior radiograph from 08/10/2015. FINDINGS: The cardiac and mediastinal silhouettes are stable in size and contour, and remain within normal limits. Aortic atherosclerosis. The lungs are normally inflated. No airspace consolidation, pleural effusion, or pulmonary edema is identified. There is no pneumothorax. No acute osseous abnormality identified. IMPRESSION: 1. No active cardiopulmonary disease. 2. Aortic atherosclerosis. Electronically Signed   By: Jeannine Boga M.D.   On: 08/23/2017 21:40   Dg Abd Portable 2v  Result Date: 08/25/2017 CLINICAL DATA:  Ileus EXAM: PORTABLE ABDOMEN - 2 VIEW COMPARISON:  Yesterday FINDINGS: Stable NG tube. There are no disproportionally dilated loops of bowel. There is no free intraperitoneal gas. No air-fluid levels. IMPRESSION: Stable NG tube. Nonobstructive bowel gas pattern.  No evidence of ileus. Electronically Signed   By: Marybelle Killings M.D.   On: 08/25/2017 11:32     ASSESSMENT AND PLAN:   61 year old male with past medical history of pancreatic cancer status post recent pancreatectomy with splenectomy done at [facility], essential hypertension, diabetes, history of coronary artery disease, history of congestive heart failure who presents to the hospital due to abdominal pain and noted to have an ileus based on a CAT scan and also with the leukocytosis and tachycardia with meeting sepsis criteria.  1. Sepsis-patient met criteria given his leukocytosis, tachycardia. No clear source of the sepsis has been identified. Patient's urinalysis is been negative, chest x-ray shows no acute pathology. His CT scan of the abdomen and pelvis showed ileus with gastritis but no evidence of any acute infectious  process. -Clinically patient is afebrile and hemodynamically stable -Off vancomycin, will continue IV Zosyn, cultures so far negative.  2. Ileus-this is a cause of patient's abdominal pain distention and nausea. Patient CT scan of the abdomen pelvis showed a suggested ileus. Patient is status post NG tube placement and continues to have significant drainage from his NG tube. -Appreciate surgical input and KUB  today showing no evidence of ileus but patient still has significant drainage. No acute surgical indication but if he were to develop one would recommend transfer to [facility].  3. Atrial flutter with RVR-this is secondary to his ileus and underlying illness. -Appreciate cardiology input and patient now on a amiodarone drip. Heart rate has improved. -Continue IV Cardizem as needed for rate control. Cor showing normal ejection fraction with no evidence of thrombus.  4. Diabetes type 2 without complication-sugars improved since yesterday. Continue sliding scale insulin. Appreciate diabetes coordinator input.  5. Leukocytosis-source unclear. We'll follow white cell count with IV abx. Therapy.      All the records are reviewed and case discussed with Care Management/Social Worker. Management plans discussed with the patient, family and they are in agreement.  CODE STATUS: Full code  DVT Prophylaxis: Lovenox  TOTAL CRITICAL CARE TIME TAKING CARE OF THIS PATIENT: 30 minutes.   POSSIBLE D/C IN 3-4 DAYS, DEPENDING ON CLINICAL CONDITION.   Henreitta Leber M.D on 08/25/2017 at 1:52 PM  Between 7am to 6pm - Pager - [phone]  After 6pm go to [email or web address] - Proofreader  [facility] [place] [facility]  Office  [phone]  CC: Primary care physician; Patient, No Pcp Per

## 2017-08-25 NOTE — Plan of Care (Signed)
  Nutrition: Adequate nutrition will be maintained 08/25/2017 2344 - Not Progressing by Larina Bras, RN   Safety: Ability to remain free from injury will improve 08/25/2017 2344 - Progressing by Larina Bras, RN   Pain Managment: General experience of comfort will improve 08/25/2017 2344 - Not Progressing by Larina Bras, RN   Physical Regulation: Ability to maintain clinical measurements within normal limits will improve 08/25/2017 2344 - Progressing by Larina Bras, RN   Skin Integrity: Risk for impaired skin integrity will decrease 08/25/2017 2344 - Progressing by Larina Bras, RN   Fluid Volume: Ability to maintain a balanced intake and output will improve 08/25/2017 2344 - Not Progressing by Larina Bras, RN   Nutrition: Adequate nutrition will be maintained 08/25/2017 2344 - Not Progressing by Larina Bras, RN

## 2017-08-25 NOTE — Progress Notes (Signed)
Has been transitioned off diltiazem to amiodarone gtt. Remains in AF. Rate well controlled (low 100s). Abdominal pain improved, KUB reveals minimal bowel gas. No distress and no new complaints. Will transfer to 2A and after transfer, PCCM will sign off. Please call if we can be of further assistance   Merton Border, MD PCCM service Mobile [phone] Pager [phone] 08/25/2017 11:23 AM

## 2017-08-25 NOTE — Progress Notes (Signed)
Report to [employer] on 2A.  Denies questions. Pt placed on Hospital Bed, and transported to 2A on Tele.

## 2017-08-26 ENCOUNTER — Inpatient Hospital Stay: Payer: Medicare Other

## 2017-08-26 LAB — HIV ANTIBODY (ROUTINE TESTING W REFLEX): HIV SCREEN 4TH GENERATION: NONREACTIVE

## 2017-08-26 LAB — GLUCOSE, CAPILLARY
GLUCOSE-CAPILLARY: 156 mg/dL — AB (ref 65–99)
GLUCOSE-CAPILLARY: 99 mg/dL (ref 65–99)
GLUCOSE-CAPILLARY: 116 mg/dL — AB (ref 65–99)
Glucose-Capillary: 241 mg/dL — ABNORMAL HIGH (ref 65–99)
Glucose-Capillary: 110 mg/dL — ABNORMAL HIGH (ref 65–99)
Glucose-Capillary: 143 mg/dL — ABNORMAL HIGH (ref 65–99)

## 2017-08-26 LAB — MAGNESIUM: MAGNESIUM: 2.1 mg/dL (ref 1.7–2.4)

## 2017-08-26 LAB — POTASSIUM: Potassium: 2.4 mmol/L — CL (ref 3.5–5.1)

## 2017-08-26 MED ORDER — POTASSIUM CHLORIDE 10 MEQ/100ML IV SOLN
10.0000 meq | INTRAVENOUS | Status: AC
  Administered 2017-08-26 (×6): 10 meq via INTRAVENOUS
  Filled 2017-08-26 (×6): qty 100

## 2017-08-26 MED ORDER — IOPAMIDOL (ISOVUE-300) INJECTION 61%
100.0000 mL | Freq: Once | INTRAVENOUS | Status: AC | PRN
  Administered 2017-08-26: 100 mL via INTRAVENOUS

## 2017-08-26 MED ORDER — HYDROMORPHONE HCL 1 MG/ML IJ SOLN
1.0000 mg | Freq: Four times a day (QID) | INTRAMUSCULAR | Status: DC | PRN
  Administered 2017-08-26: 1 mg via INTRAVENOUS
  Filled 2017-08-26: qty 1

## 2017-08-26 MED ORDER — MORPHINE SULFATE (PF) 2 MG/ML IV SOLN
2.0000 mg | INTRAVENOUS | Status: DC | PRN
  Administered 2017-08-26 – 2017-08-29 (×2): 2 mg via INTRAVENOUS
  Filled 2017-08-26 (×2): qty 1

## 2017-08-26 MED ORDER — IOPAMIDOL (ISOVUE-300) INJECTION 61%
15.0000 mL | INTRAVENOUS | Status: AC
  Administered 2017-08-26 (×2): 15 mL via ORAL

## 2017-08-26 NOTE — Progress Notes (Signed)
SUBJECTIVE: still SOB  Vitals:   08/25/17 1914 08/25/17 2354 08/26/17 0419 08/26/17 0810  BP: (!) 121/57 (!) 157/78 (!) 160/76 (!) 164/71  Pulse: (!) 122 (!) 102 94 96  Resp:    20  Temp: 98.1 F (36.7 C)  (!) 97.4 F (36.3 C) 98.3 F (36.8 C)  TempSrc: Oral  Oral Oral  SpO2: 99% 100% 100% 100%  Weight:      Height:        Intake/Output Summary (Last 24 hours) at 08/26/2017 0943 Last data filed at 08/26/2017 0802 Gross per 24 hour  Intake -  Output 1300 ml  Net -1300 ml    LABS: Basic Metabolic Panel: Recent Labs    08/24/17 0557 08/25/17 0511  NA 138 144  K 4.2 2.8*  CL 103 99*  CO2 17* 33*  GLUCOSE 324* 201*  BUN 27* 25*  CREATININE 1.05 0.78  CALCIUM 9.0 9.5   Liver Function Tests: Recent Labs    08/24/17 0557 08/25/17 0511  AST 17 16  ALT 18 15*  ALKPHOS 25* 26*  BILITOT 1.8* 1.1  PROT 7.2 7.6  ALBUMIN 3.7 3.7   Recent Labs    08/24/17 0557  LIPASE 15  AMYLASE 189*   CBC: Recent Labs    08/24/17 0557 08/25/17 0511  WBC 21.7* 20.9*  HGB 12.4* 13.1  HCT 39.1* 40.3  MCV 89.8 88.5  PLT 391 363   Cardiac Enzymes: Recent Labs    08/23/17 2325  TROPONINI 0.03*   BNP: Invalid input(s): POCBNP D-Dimer: No results for input(s): DDIMER in the last 72 hours. Hemoglobin A1C: Recent Labs    08/24/17 0557  HGBA1C 8.8*   Fasting Lipid Panel: No results for input(s): CHOL, HDL, LDLCALC, TRIG, CHOLHDL, LDLDIRECT in the last 72 hours. Thyroid Function Tests: No results for input(s): TSH, T4TOTAL, T3FREE, THYROIDAB in the last 72 hours.  Invalid input(s): FREET3 Anemia Panel: No results for input(s): VITAMINB12, FOLATE, FERRITIN, TIBC, IRON, RETICCTPCT in the last 72 hours.   PHYSICAL EXAM General: Well developed, well nourished, in no acute distress HEENT:  Normocephalic and atramatic Neck:  No JVD.  Lungs: Clear bilaterally to auscultation and percussion. Heart: HRRR . Normal S1 and S2 without gallops or murmurs.  Abdomen: Bowel  sounds are positive, abdomen soft and non-tender  Msk:  Back normal, normal gait. Normal strength and tone for age. Extremities: No clubbing, cyanosis or edema.   Neuro: Alert and oriented X 3. Psych:  Good affect, responds appropriately  TELEMETRY: NSR  ASSESSMENT AND PLAN: H/o Afib with RVR, now in NSR, unable to take po Amiodrone and once able to advise 400 qd, in the mean time continue IV.Marland Kitchen Principal Problem:   Ileus ([place]) Active Problems:   Diabetes ([place])   Atrial flutter (HCC)   CAD (coronary artery disease)   HTN (hypertension)   Atrial fibrillation (HCC)    Richard Laming A, MD, [facility] 08/26/2017 9:43 AM

## 2017-08-26 NOTE — Care Management (Addendum)
Nasogastric tube to LIS for ileus s/p pancreatectomy.  Amiodarone drip for atrial flutter RVR- which may be new diagnosis for patient.  NPO at present. Discussed mobilization of patient during progression.  At present, no discharge needs identified  Found that patient is currently open to [facility] PT OT. Agency is aware of admission

## 2017-08-26 NOTE — Progress Notes (Signed)
Md was made aware of pt's k-level of 2.4. See new order

## 2017-08-26 NOTE — Progress Notes (Signed)
CC: Postop ileus Subjective: History is reviewed with the patient and the chart as well as via Dr. Adonis Huguenin signout. Patient is possibly 3 weeks status post distal pancreatectomy. He was diagnosed with a postoperative ileus and a nasogastric tube has been placed. Today he feels much better with no abdominal pain and no tenderness. His no longer nauseated.  Objective: Vital signs in last 24 hours: Temp:  [97.4 F (36.3 C)-98.3 F (36.8 C)] 98.3 F (36.8 C) (11/05 0810) Pulse Rate:  [86-122] 96 (11/05 0810) Resp:  [8-20] 20 (11/05 0810) BP: (121-164)/(57-106) 164/71 (11/05 0810) SpO2:  [93 %-100 %] 100 % (11/05 0810) Weight:  [136 lb 4.8 oz (61.8 kg)] 136 lb 4.8 oz (61.8 kg) (11/04 1513) Last BM Date: 08/23/17  Intake/Output from previous day: 11/04 0701 - 11/05 0700 In: -  Out: 1250 [Urine:250; Emesis/NG output:1000] Intake/Output this shift: Total I/O In: -  Out: 50 [Urine:50]  Physical exam:  Vital signs are stable and reviewed afebrile. Patient appears in no acute distress. He appears comfortable. Abdomen is scaphoid soft nontender midline wound is dressed. Calves are nontender. No icterus no jaundice  Lab Results: CBC  Recent Labs    08/24/17 0557 08/25/17 0511  WBC 21.7* 20.9*  HGB 12.4* 13.1  HCT 39.1* 40.3  PLT 391 363   BMET Recent Labs    08/24/17 0557 08/25/17 0511  NA 138 144  K 4.2 2.8*  CL 103 99*  CO2 17* 33*  GLUCOSE 324* 201*  BUN 27* 25*  CREATININE 1.05 0.78  CALCIUM 9.0 9.5   PT/INR No results for input(s): LABPROT, INR in the last 72 hours. ABG Recent Labs    08/23/17 1843  HCO3 19.8*    Studies/Results: Dg Abd Portable 1v  Result Date: 08/25/2017 CLINICAL DATA:  This study identified as missing a report at 3:31 pm on 08/25/2017. 61 year old male NG tube placement. EXAM: PORTABLE ABDOMEN - 1 VIEW COMPARISON:  Subsequent abdominal series 08/25/17. FINDINGS: Portable AP supine view at 1552 hours. Enteric tube courses to the abdomen  and side hole projects at the level of the gastric body. Negative visible lung bases. Visualized bowel gas pattern is non obstructed. No acute osseous abnormality identified. IMPRESSION: NG tube placed to the stomach, side hole at the level of the gastric body. Electronically Signed   By: Genevie Ann M.D.   On: 08/25/2017 15:32   Dg Abd Portable 2v  Result Date: 08/25/2017 CLINICAL DATA:  Ileus EXAM: PORTABLE ABDOMEN - 2 VIEW COMPARISON:  Yesterday FINDINGS: Stable NG tube. There are no disproportionally dilated loops of bowel. There is no free intraperitoneal gas. No air-fluid levels. IMPRESSION: Stable NG tube. Nonobstructive bowel gas pattern.  No evidence of ileus. Electronically Signed   By: Marybelle Killings M.D.   On: 08/25/2017 11:32    Anti-infectives: Anti-infectives (From admission, onward)   Start     Dose/Rate Route Frequency Ordered Stop   08/24/17 1200  vancomycin (VANCOCIN) IVPB 1000 mg/200 mL premix  Status:  Discontinued     1,000 mg 200 mL/hr over 60 Minutes Intravenous Every 18 hours 08/23/17 2308 08/25/17 1024   08/24/17 0600  piperacillin-tazobactam (ZOSYN) IVPB 3.375 g     3.375 g 12.5 mL/hr over 240 Minutes Intravenous Every 8 hours 08/23/17 2308     08/23/17 2130  piperacillin-tazobactam (ZOSYN) IVPB 3.375 g     3.375 g 100 mL/hr over 30 Minutes Intravenous  Once 08/23/17 2118 08/23/17 2259   08/23/17 2130  vancomycin (VANCOCIN) IVPB  1000 mg/200 mL premix     1,000 mg 200 mL/hr over 60 Minutes Intravenous  Once 08/23/17 2118 08/24/17 0300      Assessment/Plan:  This is a patient 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 He was admitted with signs of a postoperative ileus. His white blood cell count was elevated yesterday and he had no new labs drawn today. He was confused thinking that he had a CT scan yesterday however. I cannot find anyone except the one performed on November 2. No new labs today but with his creatinine normalized I would  recommend repeating his CT scan with oral and IV contrast as his white blood cell count was elevated above 20,000 yesterday. Clinically he appears well but in this patient who is at risk for intra-abdominal postoperative processes a CT scan is certainly indicated.  Florene Glen, MD, FACS  08/26/2017

## 2017-08-26 NOTE — Plan of Care (Signed)
Patient still having NGT to [facility]  with dark brown drainage ,

## 2017-08-26 NOTE — Progress Notes (Signed)
Pharmacy Antibiotic Note  Richard [name] is a 61 y.o. male admitted on 08/23/2017 with sepsis.  Pharmacy has been Zosyn dosing.  Plan: Day 4 IV Abx. Continue Zosyn 3.375g IV q8h (4 hour infusion).      Height: 5\' 11"  (180.3 cm) Weight: 136 lb 4.8 oz (61.8 kg) IBW/kg (Calculated) : 75.3  Temp (24hrs), Avg:97.8 F (36.6 C), Min:97.4 F (36.3 C), Max:98.3 F (36.8 C)  Recent Labs  Lab 08/23/17 1838 08/23/17 1839 08/23/17 2216 08/23/17 2325 08/24/17 0557 08/25/17 0511  WBC 25.1*  --   --   --  21.7* 20.9*  CREATININE 1.24  --   --  1.13 1.05 0.78  LATICACIDVEN  --  2.1* 1.5  --   --   --     Estimated Creatinine Clearance: 83.7 mL/min (by C-G formula based on SCr of 0.78 mg/dL).    No Known Allergies  Antimicrobials this admission: Vancomycin 11/2   >> 11/4 Zosyn 11/2 >>   Dose adjustments this admission:  Microbiology results: 11/2 BCx: NGTD  11/2 UA: (-) 11/2 CXR: No active disease Thank you for allowing pharmacy to be a part of this patient's care.  Pernell Dupre, PharmD, BCPS Clinical Pharmacist 08/26/2017 11:23 AM

## 2017-08-26 NOTE — Progress Notes (Addendum)
[facility] [place] NAME: Richard [name]    MR#:  [id number]  DATE OF BIRTH:  [birth year]  SUBJECTIVE:   HR is still labile but improved on Amio gtt.  Cannot take Oral amio due to ileus. CT abd/pelvis today showing no bowel obstruction but still showing some abdominal/stomach distention is per surgery. Plan for keeping NG tube for the 24 hours and reassess in the morning. Patient himself denies any abdominal pain or nausea vomiting.  REVIEW OF SYSTEMS:    Review of Systems  Constitutional: Negative for fever and weight loss.  HENT: Negative for congestion, nosebleeds and tinnitus.   Eyes: Negative for blurred vision, double vision and redness.  Respiratory: Negative for cough, hemoptysis and shortness of breath.   Cardiovascular: Negative for chest pain, orthopnea, leg swelling and PND.  Gastrointestinal: Negative for abdominal pain, diarrhea, melena, nausea and vomiting.  Genitourinary: Negative for dysuria, hematuria and urgency.  Musculoskeletal: Negative for falls and joint pain.  Neurological: Negative for dizziness, tingling, sensory change, focal weakness, seizures, weakness and headaches.  Endo/Heme/Allergies: Negative for polydipsia. Does not bruise/bleed easily.  Psychiatric/Behavioral: Negative for depression and memory loss. The patient is not nervous/anxious.     Nutrition: NPO Tolerating Diet: No Tolerating PT: Await Eval.   DRUG ALLERGIES:  No Known Allergies  VITALS:  Blood pressure (!) 164/71, pulse 96, temperature 98.3 F (36.8 C), temperature source Oral, resp. rate 20, height 5' 11"  (1.803 m), weight 61.8 kg (136 lb 4.8 oz), SpO2 100 %.  PHYSICAL EXAMINATION:   Physical Exam  GENERAL:  61 y.o.-year-old cachectic patient lying in bed in NAD.   EYES: Pupils equal, round, reactive to light and accommodation. No scleral icterus. Extraocular muscles intact.  HEENT: Head atraumatic, normocephalic. NG tube in place with  some coffee ground stuff draining.   NECK:  Supple, no jugular venous distention. No thyroid enlargement, no tenderness.  LUNGS: Normal breath sounds bilaterally, no wheezing, rales, rhonchi. No use of accessory muscles of respiration.  CARDIOVASCULAR: S1, S2 Irregular. No murmurs, rubs, or gallops.  ABDOMEN: Soft, nontender, nondistended. Bowel sounds present. No organomegaly or mass. + mid-abdomen dressing in place from recent surgery.  EXTREMITIES: No cyanosis, clubbing or edema b/l.    NEUROLOGIC: Cranial nerves II through XII are intact. No focal Motor or sensory deficits b/l.   PSYCHIATRIC: The patient is alert and oriented x 3.  SKIN: No obvious rash, lesion, or ulcer.    LABORATORY PANEL:   CBC Recent Labs  Lab 08/25/17 0511  WBC 20.9*  HGB 13.1  HCT 40.3  PLT 363   ------------------------------------------------------------------------------------------------------------------  Chemistries  Recent Labs  Lab 08/25/17 0511 08/26/17 1419  NA 144  --   K 2.8* 2.4*  CL 99*  --   CO2 33*  --   GLUCOSE 201*  --   BUN 25*  --   CREATININE 0.78  --   CALCIUM 9.5  --   MG  --  2.1  AST 16  --   ALT 15*  --   ALKPHOS 26*  --   BILITOT 1.1  --    ------------------------------------------------------------------------------------------------------------------  Cardiac Enzymes Recent Labs  Lab 08/23/17 2325  TROPONINI 0.03*   ------------------------------------------------------------------------------------------------------------------  RADIOLOGY:  Ct Abdomen Pelvis W Contrast  Result Date: 08/26/2017 CLINICAL DATA:  Abdominal pain with recent postoperative ileus. Status post partial pancreatectomy and splenectomy. EXAM: CT ABDOMEN AND PELVIS WITH CONTRAST TECHNIQUE: Multidetector CT imaging of the abdomen and pelvis was  performed using the standard protocol following bolus administration of intravenous contrast. Oral contrast also administered. CONTRAST:   16m ISOVUE-300 IOPAMIDOL (ISOVUE-300) INJECTION 61% COMPARISON:  August 23, 2017 FINDINGS: Lower chest: There is a small right pleural effusion with posterior right base atelectasis. There are foci of coronary artery calcification. Hepatobiliary: There are scattered subcentimeter foci of decreased attenuation in the liver consistent with either small cysts or hamartomas. Liver otherwise appears unremarkable. Gallbladder wall is not appreciably thickened. There is no biliary duct dilatation. Pancreas: Patient is status post partial pancreatectomy. Remaining pancreas appears unremarkable. No mass or inflammatory focus evident. No pancreatic duct dilatation. No peripancreatic fluid. Spleen:  Surgically absent.  No perisplenic region fluid collection. Adrenals/Urinary Tract: Adrenals appear normal bilaterally. Kidneys bilaterally show no mass or hydronephrosis on either side. There is no renal or ureteral calculus on either side. Urinary bladder is midline with wall thickness borderline increased. Stomach/Bowel: Rectum distended with air. Nasogastric tube extends into the stomach. There is mild thickening of the wall of the distal gastric antrum and pylorus. No mass or fistula is seen in this area. There is mild thickening of the wall of the proximal to mid descending colon, possibly due to recent splenectomy. No other bowel wall thickening is noted. There is no appreciable bowel obstruction. No free air or portal venous air is evident currently. Vascular/Lymphatic: There is atherosclerotic calcification throughout much of the aorta and common iliac arteries. There appears to be hemodynamically significant obstructive disease in both proximal common iliac arteries. There is also extensive calcification in both internal iliac arteries. No aneurysm evident. Major mesenteric vessels show moderate atherosclerotic calcification but no obstruction. There is no adenopathy in the abdomen pelvis. There are subcentimeter  inguinal lymph nodes which are stable and are considered nonspecific. Reproductive: There are a few prostatic calculi. Prostate and seminal vesicles appear normal in size and contour. No pelvic mass evident. Other: There is slight ascites in the left posterior pelvis. There is also mild ascites in the right abdomen. Appendix appears unremarkable. No abscess evident in the abdomen or pelvis. Musculoskeletal: There is degenerative change at L5-S1 with vacuum phenomenon at this level. There is slight retrolisthesis of L5 on S1. There are no blastic or lytic bone lesions. There is no intramuscular or abdominal wall lesion evident. IMPRESSION: 1. Patient has had a significant portion of the body and tail of pancreas removed as well as splenectomy. No fluid collections or inflammation seen in the immediate postoperative regions. 2. Wall thickening in portions of the descending colon, consistent with a degree of colitis. This finding may in part be due to recent surgery involving the left abdomen. 3. Nasogastric tube in stomach. Wall thickening in the distal gastric antrum and pylorus may represent focal gastritis. 4. No demonstrable bowel obstruction. No abscess. Appendix appears unremarkable. 5. No evident free air. Mild ascites may be secondary to recent surgery. 6. Extensive aortoiliac atherosclerosis. There are foci of coronary artery calcification and calcification in multiple mesenteric arterial vessels. 7. Borderline urinary bladder wall thickening. There may be mild cystitis. Advise correlation with urinalysis. No renal or ureteral calculus. No hydronephrosis. There are a few prostatic calculi. 8.  Small right pleural effusion with mild right base atelectasis. Electronically Signed   By: WLowella GripIII M.D.   On: 08/26/2017 11:33   Dg Abd Portable 1v  Result Date: 08/25/2017 CLINICAL DATA:  This study identified as missing a report at 3:31 pm on 08/25/2017. 61year old male NG tube placement. EXAM:  PORTABLE ABDOMEN -  1 VIEW COMPARISON:  Subsequent abdominal series 08/25/17. FINDINGS: Portable AP supine view at 1552 hours. Enteric tube courses to the abdomen and side hole projects at the level of the gastric body. Negative visible lung bases. Visualized bowel gas pattern is non obstructed. No acute osseous abnormality identified. IMPRESSION: NG tube placed to the stomach, side hole at the level of the gastric body. Electronically Signed   By: Genevie Ann M.D.   On: 08/25/2017 15:32   Dg Abd Portable 2v  Result Date: 08/25/2017 CLINICAL DATA:  Ileus EXAM: PORTABLE ABDOMEN - 2 VIEW COMPARISON:  Yesterday FINDINGS: Stable NG tube. There are no disproportionally dilated loops of bowel. There is no free intraperitoneal gas. No air-fluid levels. IMPRESSION: Stable NG tube. Nonobstructive bowel gas pattern.  No evidence of ileus. Electronically Signed   By: Marybelle Killings M.D.   On: 08/25/2017 11:32     ASSESSMENT AND PLAN:   61 year old male with past medical history of pancreatic cancer status post recent pancreatectomy with splenectomy done at [facility], essential hypertension, diabetes, history of coronary artery disease, history of congestive heart failure who presents to the hospital due to abdominal pain and noted to have an ileus based on a CAT scan and also with the leukocytosis and tachycardia with meeting sepsis criteria.  1. Sepsis-patient met criteria given his leukocytosis, tachycardia. No clear source of the sepsis has been identified. Patient's urinalysis is been negative, chest x-ray shows no acute pathology. Patient had repeat CT scan of the abdomen and pelvis with contrast done today showing no evidence of acute infectious process. -Clinically patient is afebrile and hemodynamically stable - cont. Zosyn and cultures so far (-).   2. Ileus-this is the cause of patient's abdominal pain distention and nausea. Repeat CT scan of the abdomen and pelvis done today with contrast showing some mild  colitis which is likely related to his recent surgery with pancreatectomy and splenectomy. No evidence of ileus or bowel obstruction noted. As per surgery patient still has significant contrast and distention of the stomach. -We'll continue NG tube compression for now. Continue supportive care and repeat films in the morning.  3. Atrial flutter with RVR-this is secondary to his ileus and underlying illness. -Remains on amiodarone drip and rate side improved over the past 24-48 hours. We'll switch to oral amiodarone when patient can take oral.  -Continue IV Cardizem as needed for rate control. Echo showing normal ejection fraction with no evidence of thrombus.  4. Diabetes type 2 without complication-sugars improved since yesterday. Continue sliding scale insulin. Appreciate diabetes coordinator input.  5. Leukocytosis-source unclear. We'll follow white cell count with IV abx. Therapy.    6. Hypokalemia - due to increasing drainage from the NG tube.  - will replace potassium.  Pharmacy consult for electrolyte replacement.  - Mg. Level normal.    All the records are reviewed and case discussed with Care Management/Social Worker. Management plans discussed with the patient, family and they are in agreement.  CODE STATUS: Full code  DVT Prophylaxis: Lovenox  TOTAL CRITICAL CARE TIME TAKING CARE OF THIS PATIENT: 30 minutes.   POSSIBLE D/C IN 2-3 DAYS, DEPENDING ON CLINICAL CONDITION.   Henreitta Leber M.D on 08/26/2017 at 3:03 PM  Between 7am to 6pm - Pager - [phone]  After 6pm go to [email or web address] - Proofreader  [facility]  Office  [phone]  CC: Primary care physician; Patient, No Pcp Per

## 2017-08-26 NOTE — Progress Notes (Signed)
[place] rounding and attempted a follow-up visit with patient. Patient was with medical staff. [place] will try at a later time.   08/26/17 0930  Clinical Encounter Type  Visited With Patient not available  Visit Type Follow-up;Spiritual support  Referral From Chaplain

## 2017-08-27 DIAGNOSIS — I481 Persistent atrial fibrillation: Secondary | ICD-10-CM

## 2017-08-27 LAB — GLUCOSE, CAPILLARY
GLUCOSE-CAPILLARY: 168 mg/dL — AB (ref 65–99)
GLUCOSE-CAPILLARY: 205 mg/dL — AB (ref 65–99)
Glucose-Capillary: 155 mg/dL — ABNORMAL HIGH (ref 65–99)
Glucose-Capillary: 140 mg/dL — ABNORMAL HIGH (ref 65–99)
Glucose-Capillary: 402 mg/dL — ABNORMAL HIGH (ref 65–99)
Glucose-Capillary: 102 mg/dL — ABNORMAL HIGH (ref 65–99)

## 2017-08-27 LAB — COMPREHENSIVE METABOLIC PANEL
ALK PHOS: 25 U/L — AB (ref 38–126)
ALT: 12 U/L — AB (ref 17–63)
ANION GAP: 21 — AB (ref 5–15)
AST: 27 U/L (ref 15–41)
Albumin: 3.4 g/dL — ABNORMAL LOW (ref 3.5–5.0)
BILIRUBIN TOTAL: 1.2 mg/dL (ref 0.3–1.2)
BUN: 46 mg/dL — AB (ref 6–20)
CO2: 37 mmol/L — AB (ref 22–32)
CREATININE: 3.91 mg/dL — AB (ref 0.61–1.24)
Calcium: 9.2 mg/dL (ref 8.9–10.3)
Chloride: 89 mmol/L — ABNORMAL LOW (ref 101–111)
GFR calc non Af Amer: 15 mL/min — ABNORMAL LOW (ref >60)
GFR, EST AFRICAN AMERICAN: 18 mL/min — AB (ref >60)
Glucose, Bld: 151 mg/dL — ABNORMAL HIGH (ref 65–99)
Potassium: 2.4 mmol/L — CL (ref 3.5–5.1)
Sodium: 147 mmol/L — ABNORMAL HIGH (ref 135–145)
Total Protein: 7.4 g/dL (ref 6.5–8.1)

## 2017-08-27 LAB — CBC WITH DIFFERENTIAL/PLATELET
Basophils Absolute: 0 10*3/uL (ref 0–0.1)
Basophils Relative: 0 %
Eosinophils Absolute: 0 10*3/uL (ref 0–0.7)
Eosinophils Relative: 0 %
HEMATOCRIT: 39.5 % — AB (ref 40.0–52.0)
HEMOGLOBIN: 12.6 g/dL — AB (ref 13.0–18.0)
LYMPHS ABS: 1.4 10*3/uL (ref 1.0–3.6)
LYMPHS PCT: 9 %
MCH: 28.1 pg (ref 26.0–34.0)
MCHC: 31.8 g/dL — AB (ref 32.0–36.0)
MCV: 88.1 fL (ref 80.0–100.0)
MONOS PCT: 10 %
Monocytes Absolute: 1.6 10*3/uL — ABNORMAL HIGH (ref 0.2–1.0)
NEUTROS ABS: 12.4 10*3/uL — AB (ref 1.4–6.5)
NEUTROS PCT: 81 %
Platelets: 340 10*3/uL (ref 150–440)
RBC: 4.48 MIL/uL (ref 4.40–5.90)
RDW: 15.1 % — ABNORMAL HIGH (ref 11.5–14.5)
WBC: 15.4 10*3/uL — ABNORMAL HIGH (ref 3.8–10.6)

## 2017-08-27 MED ORDER — POTASSIUM CHLORIDE IN NACL 20-0.9 MEQ/L-% IV SOLN
INTRAVENOUS | Status: DC
  Administered 2017-08-27 – 2017-08-29 (×4): via INTRAVENOUS
  Filled 2017-08-27 (×8): qty 1000

## 2017-08-27 MED ORDER — HEPARIN SODIUM (PORCINE) 5000 UNIT/ML IJ SOLN
5000.0000 [IU] | Freq: Three times a day (TID) | INTRAMUSCULAR | Status: DC
  Administered 2017-08-27 – 2017-08-28 (×2): 5000 [IU] via SUBCUTANEOUS
  Filled 2017-08-27 (×2): qty 1

## 2017-08-27 MED ORDER — AMIODARONE HCL 200 MG PO TABS: 400.0000 mg | ORAL_TABLET | Freq: Two times a day (BID) | ORAL | Status: DC

## 2017-08-27 MED ORDER — AMIODARONE HCL 200 MG PO TABS
400.0000 mg | ORAL_TABLET | Freq: Two times a day (BID) | ORAL | Status: DC
  Administered 2017-08-27 – 2017-08-29 (×4): 400 mg via ORAL
  Filled 2017-08-27 (×4): qty 2

## 2017-08-27 MED ORDER — POTASSIUM CHLORIDE CRYS ER 20 MEQ PO TBCR
20.0000 meq | EXTENDED_RELEASE_TABLET | Freq: Three times a day (TID) | ORAL | Status: DC
  Administered 2017-08-27 – 2017-08-28 (×6): 20 meq via ORAL
  Filled 2017-08-27 (×6): qty 1

## 2017-08-27 MED ORDER — UNJURY CHICKEN SOUP POWDER
2.0000 [oz_av] | Freq: Two times a day (BID) | ORAL | Status: DC
  Administered 2017-08-28: 2 [oz_av] via ORAL

## 2017-08-27 MED ORDER — PIPERACILLIN-TAZOBACTAM 3.375 G IVPB
3.3750 g | Freq: Two times a day (BID) | INTRAVENOUS | Status: DC
  Administered 2017-08-27 – 2017-08-28 (×2): 3.375 g via INTRAVENOUS
  Filled 2017-08-27 (×2): qty 50

## 2017-08-27 NOTE — Progress Notes (Signed)
CBG 205. Glucometer has not synched yet.

## 2017-08-27 NOTE — Progress Notes (Signed)
Dr. Verdell Carmine aware of low K. MD to place order for PO potassium to give once NG tube is pulled this morning.

## 2017-08-27 NOTE — Progress Notes (Signed)
Pharmacy Antibiotic Note  Richard [name] is a 61 y.o. male admitted on 08/23/2017 with sepsis.  Pharmacy has been Zosyn dosing.  Plan: Day 5 IV Abx. Scr now 3.91. Will adjust Zosyn to 3.375g IV q12h (4 hour infusion) due to current CrCl <59ml/min      Height: 5\' 11"  (180.3 cm) Weight: 136 lb 4.8 oz (61.8 kg) IBW/kg (Calculated) : 75.3  Temp (24hrs), Avg:98.1 F (36.7 C), Min:98 F (36.7 C), Max:98.3 F (36.8 C)  Recent Labs  Lab 08/23/17 1838 08/23/17 1839 08/23/17 2216 08/23/17 2325 08/24/17 0557 08/25/17 0511 08/27/17 0502  WBC 25.1*  --   --   --  21.7* 20.9* 15.4*  CREATININE 1.24  --   --  1.13 1.05 0.78 3.91*  LATICACIDVEN  --  2.1* 1.5  --   --   --   --     Estimated Creatinine Clearance: 17.1 mL/min (A) (by C-G formula based on SCr of 3.91 mg/dL (H)).    No Known Allergies  Antimicrobials this admission: Vancomycin 11/2   >> 11/4 Zosyn 11/2 >>   Dose adjustments this admission:  Microbiology results: 11/2 BCx: NGTD 11/2 UA: (-) 11/2 CXR: No active disease Thank you for allowing pharmacy to be a part of this patient's care.  Pernell Dupre, PharmD, BCPS Clinical Pharmacist 08/27/2017 10:47 AM

## 2017-08-27 NOTE — Progress Notes (Signed)
Waiting for IV team to attmpt 2nd IV site. Per Pharmacist, [id number] can infuse with the amio gtt.

## 2017-08-27 NOTE — Progress Notes (Signed)
CC: Postop ileus Subjective: This patient expressed postoperative ileus several weeks after a distal pancreatectomy. He had a CT scan yesterday which was a repeat at this time with contrast showing no sign of a leak or fluid collection.  Today he feels much better no nausea vomiting he is passing gas and wants to eat. Eyes any abdominal pain.  Objective: Vital signs in last 24 hours: Temp:  [98 F (36.7 C)-98.3 F (36.8 C)] 98.3 F (36.8 C) (11/06 0830) Pulse Rate:  [94-109] 98 (11/06 0830) Resp:  [18-20] 20 (11/06 0830) BP: (145-158)/(66-93) 146/74 (11/06 0830) SpO2:  [99 %-100 %] 100 % (11/06 0830) Last BM Date: 08/23/17  Intake/Output from previous day: 11/05 0701 - 11/06 0700 In: 3975.4 [I.V.:2975.4; NG/GT:500; IV Piggyback:500] Out: 1350 [Urine:450; Emesis/NG output:900] Intake/Output this shift: No intake/output data recorded.  Physical exam:  Vital signs are stable and afebrile no acute distress abdomen is soft and scaphoid nondistended nontympanitic nontender wound is clean no erythema or drainage  Lab Results: CBC  Recent Labs    08/25/17 0511 08/27/17 0502  WBC 20.9* 15.4*  HGB 13.1 12.6*  HCT 40.3 39.5*  PLT 363 340   BMET Recent Labs    08/25/17 0511 08/26/17 1419 08/27/17 0502  NA 144  --  147*  K 2.8* 2.4* 2.4*  CL 99*  --  89*  CO2 33*  --  37*  GLUCOSE 201*  --  151*  BUN 25*  --  46*  CREATININE 0.78  --  3.91*  CALCIUM 9.5  --  9.2   PT/INR No results for input(s): LABPROT, INR in the last 72 hours. ABG No results for input(s): PHART, HCO3 in the last 72 hours.  Invalid input(s): PCO2, PO2  Studies/Results: Ct Abdomen Pelvis W Contrast  Result Date: 08/26/2017 CLINICAL DATA:  Abdominal pain with recent postoperative ileus. Status post partial pancreatectomy and splenectomy. EXAM: CT ABDOMEN AND PELVIS WITH CONTRAST TECHNIQUE: Multidetector CT imaging of the abdomen and pelvis was performed using the standard protocol following bolus  administration of intravenous contrast. Oral contrast also administered. CONTRAST:  174mL ISOVUE-300 IOPAMIDOL (ISOVUE-300) INJECTION 61% COMPARISON:  August 23, 2017 FINDINGS: Lower chest: There is a small right pleural effusion with posterior right base atelectasis. There are foci of coronary artery calcification. Hepatobiliary: There are scattered subcentimeter foci of decreased attenuation in the liver consistent with either small cysts or hamartomas. Liver otherwise appears unremarkable. Gallbladder wall is not appreciably thickened. There is no biliary duct dilatation. Pancreas: Patient is status post partial pancreatectomy. Remaining pancreas appears unremarkable. No mass or inflammatory focus evident. No pancreatic duct dilatation. No peripancreatic fluid. Spleen:  Surgically absent.  No perisplenic region fluid collection. Adrenals/Urinary Tract: Adrenals appear normal bilaterally. Kidneys bilaterally show no mass or hydronephrosis on either side. There is no renal or ureteral calculus on either side. Urinary bladder is midline with wall thickness borderline increased. Stomach/Bowel: Rectum distended with air. Nasogastric tube extends into the stomach. There is mild thickening of the wall of the distal gastric antrum and pylorus. No mass or fistula is seen in this area. There is mild thickening of the wall of the proximal to mid descending colon, possibly due to recent splenectomy. No other bowel wall thickening is noted. There is no appreciable bowel obstruction. No free air or portal venous air is evident currently. Vascular/Lymphatic: There is atherosclerotic calcification throughout much of the aorta and common iliac arteries. There appears to be hemodynamically significant obstructive disease in both proximal common iliac  arteries. There is also extensive calcification in both internal iliac arteries. No aneurysm evident. Major mesenteric vessels show moderate atherosclerotic calcification but no  obstruction. There is no adenopathy in the abdomen pelvis. There are subcentimeter inguinal lymph nodes which are stable and are considered nonspecific. Reproductive: There are a few prostatic calculi. Prostate and seminal vesicles appear normal in size and contour. No pelvic mass evident. Other: There is slight ascites in the left posterior pelvis. There is also mild ascites in the right abdomen. Appendix appears unremarkable. No abscess evident in the abdomen or pelvis. Musculoskeletal: There is degenerative change at L5-S1 with vacuum phenomenon at this level. There is slight retrolisthesis of L5 on S1. There are no blastic or lytic bone lesions. There is no intramuscular or abdominal wall lesion evident. IMPRESSION: 1. Patient has had a significant portion of the body and tail of pancreas removed as well as splenectomy. No fluid collections or inflammation seen in the immediate postoperative regions. 2. Wall thickening in portions of the descending colon, consistent with a degree of colitis. This finding may in part be due to recent surgery involving the left abdomen. 3. Nasogastric tube in stomach. Wall thickening in the distal gastric antrum and pylorus may represent focal gastritis. 4. No demonstrable bowel obstruction. No abscess. Appendix appears unremarkable. 5. No evident free air. Mild ascites may be secondary to recent surgery. 6. Extensive aortoiliac atherosclerosis. There are foci of coronary artery calcification and calcification in multiple mesenteric arterial vessels. 7. Borderline urinary bladder wall thickening. There may be mild cystitis. Advise correlation with urinalysis. No renal or ureteral calculus. No hydronephrosis. There are a few prostatic calculi. 8.  Small right pleural effusion with mild right base atelectasis. Electronically Signed   By: Lowella Grip III M.D.   On: 08/26/2017 11:33    Anti-infectives: Anti-infectives (From admission, onward)   Start     Dose/Rate Route  Frequency Ordered Stop   08/24/17 1200  vancomycin (VANCOCIN) IVPB 1000 mg/200 mL premix  Status:  Discontinued     1,000 mg 200 mL/hr over 60 Minutes Intravenous Every 18 hours 08/23/17 2308 08/25/17 1024   08/24/17 0600  piperacillin-tazobactam (ZOSYN) IVPB 3.375 g     3.375 g 12.5 mL/hr over 240 Minutes Intravenous Every 8 hours 08/23/17 2308     08/23/17 2130  piperacillin-tazobactam (ZOSYN) IVPB 3.375 g     3.375 g 100 mL/hr over 30 Minutes Intravenous  Once 08/23/17 2118 08/23/17 2259   08/23/17 2130  vancomycin (VANCOCIN) IVPB 1000 mg/200 mL premix     1,000 mg 200 mL/hr over 60 Minutes Intravenous  Once 08/23/17 2118 08/24/17 0300      Assessment/Plan:  White blood cells count is improved but still slightly abnormal. Electrolytes are grossly abnormal especially the potassium. Patient seems to be doing better and wishes to try to eat. He is not showing signs of a bowel obstruction or leak and therefore his nasogastric tube will be removed today and he will be started on clear liquid diet.Florene Glen, MD, FACS  08/27/2017

## 2017-08-27 NOTE — Consult Note (Signed)
[facility]  CONSULT NOTE    Date: 08/27/2017                  Patient Name:  Richard [name]  MRN: [id number]  DOB: 1956-03-23  Age / Sex: 61 y.o., male         PCP: Patient, No Pcp Per                 Service Requesting Consult: Dr. Verdell Carmine                 Reason for Consult: Acute Kidney Failure            History of Present Illness: Mr. Richard [name] is a 61 y.o. black male with pancreatectomy and splenectomy on 10/10 at [facility] for pancreatic cancer, complicated by post op ileus, diabetes mellitus type II, hypertension, coronary artery disease, and congestive heart failure , who was admitted to [facility] on 08/23/2017 for Hyperglycemia [R73.9] Abdominal pain, diffuse [R10.84]  Patient admitted with nausea, vomiting, diarrhea and abdominal pain. He underwent CT with contrast on 115. Creatinine 0.78 on admission and now 3.91. Nephrology consulted.   Patient states he is starting to feel better.    Medications: Outpatient medications: Medications Prior to Admission  Medication Sig Dispense Refill Last Dose  . aspirin EC 81 MG tablet Take 1 tablet (81 mg total) by mouth daily. 30 tablet 0 08/23/2017 at Unknown time  . atorvastatin (LIPITOR) 40 MG tablet Take 40 mg by mouth every evening.     . enoxaparin (LOVENOX) 40 MG/0.4ML injection Inject 40 mg into the skin daily.     Marland Kitchen gabapentin (NEURONTIN) 800 MG tablet Take 800 mg by mouth 3 (three) times daily.   08/23/2017 at Unknown time  . HYDROmorphone (DILAUDID) 2 MG tablet Take 2 mg by mouth every 6 (six) hours as needed for pain.     Marland Kitchen insulin glargine (LANTUS) 100 UNIT/ML injection Inject 0.2 mLs (20 Units total) into the skin at bedtime. Sliding scale. Max out at 50 units (Patient taking differently: Inject 20 Units into the skin daily. Sliding scale. Max out at 50 units) 10 mL 0 08/23/2017 at Unknown time  . insulin lispro (HUMALOG) 100 UNIT/ML injection Inject 3 Units into the skin 3 (three) times daily with meals.    08/23/2017 at Unknown time  . metFORMIN (GLUCOPHAGE) 500 MG tablet Take 500 mg by mouth 2 (two) times daily with a meal.   08/23/2017 at Unknown time  . metoCLOPramide (REGLAN) 10 MG tablet Take 10 mg by mouth 3 (three) times daily as needed for nausea.     . metoprolol tartrate (LOPRESSOR) 25 MG tablet Take 0.5 tablets (12.5 mg total) by mouth 2 (two) times daily. (Patient taking differently: Take 25 mg by mouth 2 (two) times daily. ) 30 tablet 0 08/23/2017 at Unknown time  . ondansetron (ZOFRAN) 4 MG tablet Take 4 mg by mouth every 8 (eight) hours as needed for nausea or vomiting.   prn  . pantoprazole (PROTONIX) 40 MG tablet Take 40 mg by mouth 2 (two) times daily.     . cyclobenzaprine (FLEXERIL) 10 MG tablet Take 1 tablet (10 mg total) by mouth 3 (three) times daily as needed for muscle spasms. (Patient not taking: Reported on 08/23/2017) 12 tablet 0 Not Taking at Unknown time  . gabapentin (NEURONTIN) 600 MG tablet Take 1 tablet (600 mg total) by mouth 3 (three) times daily. 50 tablet 0 Taking  . GLOBAL EASE INJECT  PEN NEEDLES 31G X 5 MM MISC    Not Taking  . HYDROcodone-acetaminophen (NORCO) 5-325 MG tablet Take 1 tablet by mouth every 4 (four) hours as needed for moderate pain. (Patient not taking: Reported on 08/23/2017) 6 tablet 0 Not Taking at Unknown time  . insulin regular (NOVOLIN R,HUMULIN R) 100 units/mL injection Inject 0.08 mLs (8 Units total) into the skin 3 (three) times daily before meals. (Patient not taking: Reported on 08/23/2017) 10 mL 11 Not Taking at Unknown time  . Needles & Syringes MISC Use as directed 100 each 3 Taking  . pravastatin (PRAVACHOL) 80 MG tablet Take 1 tablet (80 mg total) by mouth at bedtime. (Patient not taking: Reported on 08/23/2017) 30 tablet 0 Not Taking at Unknown time    Current medications: Current Facility-Administered Medications  Medication Dose Route Frequency Provider Last Rate Last Dose  . 0.9 % NaCl with KCl 20 mEq/ L  infusion   Intravenous  Continuous Henreitta Leber, MD 125 mL/hr at 08/27/17 1008    . acetaminophen (TYLENOL) tablet 650 mg  650 mg Oral Q6H PRN Lance Coon, MD       Or  . acetaminophen (TYLENOL) suppository 650 mg  650 mg Rectal Q6H PRN Lance Coon, MD      . amiodarone (PACERONE) tablet 400 mg  400 mg Oral BID Henreitta Leber, MD      . heparin injection 5,000 Units  5,000 Units Subcutaneous Q8H Sainani, Belia Heman, MD      . HYDROmorphone (DILAUDID) injection 1 mg  1 mg Intravenous Q6H PRN Harrie Foreman, MD   1 mg at 08/26/17 1354  . insulin aspart (novoLOG) injection 0-15 Units  0-15 Units Subcutaneous Q4H Awilda Bill, NP   15 Units at 08/27/17 1239  . insulin glargine (LANTUS) injection 10 Units  10 Units Subcutaneous Daily Lance Coon, MD   10 Units at 08/27/17 0914  . metoCLOPramide (REGLAN) injection 5 mg  5 mg Intravenous Q6H Lance Coon, MD   5 mg at 08/27/17 0454  . metoprolol tartrate (LOPRESSOR) injection 2.5-5 mg  2.5-5 mg Intravenous Q3H PRN Wilhelmina Mcardle, MD      . morphine 2 MG/ML injection 2-4 mg  2-4 mg Intravenous Q4H PRN Harrie Foreman, MD   2 mg at 08/26/17 4166  . ondansetron (ZOFRAN) tablet 4 mg  4 mg Oral Q6H PRN Lance Coon, MD       Or  . ondansetron [facility]) injection 4 mg  4 mg Intravenous Q6H PRN Lance Coon, MD      . piperacillin-tazobactam (ZOSYN) IVPB 3.375 g  3.375 g Intravenous Q12H Hallaji, Dani Gobble, RPH      . potassium chloride SA (K-DUR,KLOR-CON) CR tablet 20 mEq  20 mEq Oral TID Henreitta Leber, MD   20 mEq at 08/27/17 1007  . protein supplement (UNJURY CHICKEN SOUP) powder 2 oz  2 oz Oral BID Henreitta Leber, MD          Allergies: No Known Allergies    Past Medical History: Past Medical History:  Diagnosis Date  . CHF (congestive heart failure) ([place])   . Chronic back pain   . Chronic leg pain   . Coronary artery disease   . Diabetes mellitus without complication ([place])   . Hypertension   . Neuropathy      Past Surgical  History: Past Surgical History:  Procedure Laterality Date  . CORONARY ANGIOPLASTY WITH STENT PLACEMENT  Family History: Family History  Problem Relation Age of Onset  . CAD Mother   . Diabetes Mellitus II Father      Social History: Social History   Socioeconomic History  . Marital status: Single    Spouse name: Not on file  . Number of children: Not on file  . Years of education: Not on file  . Highest education level: Not on file  Social Needs  . Financial resource strain: Not on file  . Food insecurity - worry: Not on file  . Food insecurity - inability: Not on file  . Transportation needs - medical: Not on file  . Transportation needs - non-medical: Not on file  Occupational History  . Not on file  Tobacco Use  . Smoking status: Current Every Day Smoker    Packs/day: 0.50  . Smokeless tobacco: Never Used  Substance and Sexual Activity  . Alcohol use: No    Alcohol/week: 0.0 oz  . 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 Sexual activity: Not on file  Other Topics Concern  . Not on file  Social History Narrative  . Not on file     Review of Systems: Review of Systems  Constitutional: Negative.  Negative for chills, diaphoresis, fever, malaise/fatigue and weight loss.  HENT: Negative.  Negative for congestion, ear discharge, ear pain, hearing loss, nosebleeds, sinus pain, sore throat and tinnitus.   Eyes: Negative for blurred vision, double vision, photophobia, pain, discharge and redness.  Respiratory: Negative.  Negative for cough, hemoptysis, sputum production, shortness of breath, wheezing and stridor.   Cardiovascular: Negative.  Negative for chest pain, palpitations, orthopnea, claudication, leg swelling and PND.  Gastrointestinal: Positive for abdominal pain, diarrhea, heartburn, nausea and vomiting. Negative for blood in stool, constipation and melena.  Genitourinary: Negative.  Negative for dysuria, flank  pain, frequency, hematuria and urgency.  Musculoskeletal: Negative.  Negative for back pain, falls, joint pain, myalgias and neck pain.  Skin: Negative.  Negative for itching and rash.  Neurological: Negative.  Negative for dizziness, tingling, tremors, sensory change, speech change, focal weakness, seizures, loss of consciousness, weakness and headaches.  Endo/Heme/Allergies: Negative.  Negative for environmental allergies and polydipsia. Does not bruise/bleed easily.  Psychiatric/Behavioral: Negative.  Negative for depression, hallucinations, memory loss, substance abuse and suicidal ideas. The patient is not nervous/anxious and does not have insomnia.     Vital Signs: Blood pressure 121/69, pulse 98, temperature 98.4 F (36.9 C), temperature source Oral, resp. rate 20, height 5\' 11"  (1.803 m), weight 61.8 kg (136 lb 4.8 oz), SpO2 99 %.  Weight trends: Filed Weights   08/24/17 0242 08/24/17 1141 08/25/17 1513  Weight: 64 kg (141 lb 1 oz) 62.8 kg (138 lb 7.2 oz) 61.8 kg (136 lb 4.8 oz)    Physical Exam: General: NAD, laying in bed  Head: Normocephalic, atraumatic. Moist oral mucosal membranes  Eyes: Anicteric, PERRL  Neck: Supple, trachea midline  Lungs:  Clear to auscultation  Heart: Regular rate and rhythm  Abdomen:  Soft, nontender,   Extremities: no peripheral edema.  Neurologic: Nonfocal, moving all four extremities  Skin: No lesions        Lab results: Basic Metabolic Panel: Recent Labs  Lab 08/24/17 0557 08/25/17 0511 08/26/17 1419 08/27/17 0502  NA 138 144  --  147*  K 4.2 2.8* 2.4* 2.4*  CL 103 99*  --  89*  CO2 17* 33*  --  37*  GLUCOSE 324* 201*  --  151*  BUN 27* 25*  --  46*  CREATININE 1.05 0.78  --  3.91*  CALCIUM 9.0 9.5  --  9.2  MG  --   --  2.1  --     Liver Function Tests: Recent Labs  Lab 08/24/17 0557 08/25/17 0511 08/27/17 0502  AST 17 16 27   ALT 18 15* 12*  ALKPHOS 25* 26* 25*  BILITOT 1.8* 1.1 1.2  PROT 7.2 7.6 7.4  ALBUMIN 3.7  3.7 3.4*   Recent Labs  Lab 08/24/17 0557  LIPASE 15  AMYLASE 189*   No results for input(s): AMMONIA in the last 168 hours.  CBC: Recent Labs  Lab 08/23/17 1838 08/24/17 0557 08/25/17 0511 08/27/17 0502  WBC 25.1* 21.7* 20.9* 15.4*  NEUTROABS  --   --   --  12.4*  HGB 13.7 12.4* 13.1 12.6*  HCT 42.9 39.1* 40.3 39.5*  MCV 89.4 89.8 88.5 88.1  PLT 471* 391 363 340    Cardiac Enzymes: Recent Labs  Lab 08/23/17 2325  TROPONINI 0.03*    BNP: Invalid input(s): POCBNP  CBG: Recent Labs  Lab 08/26/17 2002 08/26/17 2354 08/27/17 0418 08/27/17 0831 08/27/17 1203  GLUCAP 116* 168* 155* 140* 402*    Microbiology: Results for orders placed or performed during the hospital encounter of 08/23/17  Culture, blood (routine x 2)     Status: None (Preliminary result)   Collection Time: 08/23/17  6:30 PM  Result Value Ref Range Status   Specimen Description BLOOD RIGHT FOREARM  Final   Special Requests   Final    BOTTLES DRAWN AEROBIC AND ANAEROBIC Blood Culture results may not be optimal due to an inadequate volume of blood received in culture bottles   Culture NO GROWTH 4 DAYS  Final   Report Status PENDING  Incomplete  Culture, blood (routine x 2)     Status: None (Preliminary result)   Collection Time: 08/23/17  6:39 PM  Result Value Ref Range Status   Specimen Description BLOOD LEFT FOREARM  Final   Special Requests   Final    BOTTLES DRAWN AEROBIC AND ANAEROBIC Blood Culture adequate volume   Culture NO GROWTH 4 DAYS  Final   Report Status PENDING  Incomplete  MRSA PCR Screening     Status: None   Collection Time: 08/24/17  9:43 AM  Result Value Ref Range Status   MRSA by PCR NEGATIVE NEGATIVE Final    Comment:        The GeneXpert MRSA Assay (FDA approved for NASAL specimens only), is one component of a comprehensive MRSA colonization surveillance program. It is not intended to diagnose MRSA infection nor to guide or monitor treatment for MRSA  infections.     Coagulation Studies: No results for input(s): LABPROT, INR in the last 72 hours.  Urinalysis: No results for input(s): COLORURINE, LABSPEC, PHURINE, GLUCOSEU, HGBUR, BILIRUBINUR, KETONESUR, PROTEINUR, UROBILINOGEN, NITRITE, LEUKOCYTESUR in the last 72 hours.  Invalid input(s): APPERANCEUR    Imaging: Ct Abdomen Pelvis W Contrast  Result Date: 08/26/2017 CLINICAL DATA:  Abdominal pain with recent postoperative ileus. Status post partial pancreatectomy and splenectomy. EXAM: CT ABDOMEN AND PELVIS WITH CONTRAST TECHNIQUE: Multidetector CT imaging of the abdomen and pelvis was performed using the standard protocol following bolus administration of intravenous contrast. Oral contrast also administered. CONTRAST:  150mL ISOVUE-300 IOPAMIDOL (ISOVUE-300) INJECTION 61% COMPARISON:  August 23, 2017 FINDINGS: Lower chest: There is a small right pleural effusion with posterior right  base atelectasis. There are foci of coronary artery calcification. Hepatobiliary: There are scattered subcentimeter foci of decreased attenuation in the liver consistent with either small cysts or hamartomas. Liver otherwise appears unremarkable. Gallbladder wall is not appreciably thickened. There is no biliary duct dilatation. Pancreas: Patient is status post partial pancreatectomy. Remaining pancreas appears unremarkable. No mass or inflammatory focus evident. No pancreatic duct dilatation. No peripancreatic fluid. Spleen:  Surgically absent.  No perisplenic region fluid collection. Adrenals/Urinary Tract: Adrenals appear normal bilaterally. Kidneys bilaterally show no mass or hydronephrosis on either side. There is no renal or ureteral calculus on either side. Urinary bladder is midline with wall thickness borderline increased. Stomach/Bowel: Rectum distended with air. Nasogastric tube extends into the stomach. There is mild thickening of the wall of the distal gastric antrum and pylorus. No mass or fistula is  seen in this area. There is mild thickening of the wall of the proximal to mid descending colon, possibly due to recent splenectomy. No other bowel wall thickening is noted. There is no appreciable bowel obstruction. No free air or portal venous air is evident currently. Vascular/Lymphatic: There is atherosclerotic calcification throughout much of the aorta and common iliac arteries. There appears to be hemodynamically significant obstructive disease in both proximal common iliac arteries. There is also extensive calcification in both internal iliac arteries. No aneurysm evident. Major mesenteric vessels show moderate atherosclerotic calcification but no obstruction. There is no adenopathy in the abdomen pelvis. There are subcentimeter inguinal lymph nodes which are stable and are considered nonspecific. Reproductive: There are a few prostatic calculi. Prostate and seminal vesicles appear normal in size and contour. No pelvic mass evident. Other: There is slight ascites in the left posterior pelvis. There is also mild ascites in the right abdomen. Appendix appears unremarkable. No abscess evident in the abdomen or pelvis. Musculoskeletal: There is degenerative change at L5-S1 with vacuum phenomenon at this level. There is slight retrolisthesis of L5 on S1. There are no blastic or lytic bone lesions. There is no intramuscular or abdominal wall lesion evident. IMPRESSION: 1. Patient has had a significant portion of the body and tail of pancreas removed as well as splenectomy. No fluid collections or inflammation seen in the immediate postoperative regions. 2. Wall thickening in portions of the descending colon, consistent with a degree of colitis. This finding may in part be due to recent surgery involving the left abdomen. 3. Nasogastric tube in stomach. Wall thickening in the distal gastric antrum and pylorus may represent focal gastritis. 4. No demonstrable bowel obstruction. No abscess. Appendix appears  unremarkable. 5. No evident free air. Mild ascites may be secondary to recent surgery. 6. Extensive aortoiliac atherosclerosis. There are foci of coronary artery calcification and calcification in multiple mesenteric arterial vessels. 7. Borderline urinary bladder wall thickening. There may be mild cystitis. Advise correlation with urinalysis. No renal or ureteral calculus. No hydronephrosis. There are a few prostatic calculi. 8.  Small right pleural effusion with mild right base atelectasis. Electronically Signed   By: Lowella Grip III M.D.   On: 08/26/2017 11:33      Assessment & Plan: Mr. Anacleto Batterman is a 61 y.o. black male with pancreatectomy and splenectomy on 10/10 at [facility] for pancreatic cancer, complicated by post op ileus, diabetes mellitus type II, hypertension, coronary artery disease, and congestive heart failure , who was admitted to [facility] on 08/23/2017 for Hyperglycemia [R73.9] Abdominal pain, diffuse [R10.84]  1. Acute Renal Failure with hypokalemia, hyperkalemia and metabolic alkalosis: no history of chronic  kidney disease.  IV contrast exposure on 11/5.  Also with prerenal azotemia  Electrolytes are consistent with contraction alkalosis and GI losses - Agree with supportive care - Continue IV fluids - replace Potassium  2. Hypertension: blood pressure currently at goal.   3. Diabetes mellitus type II with renal manifestations: glucosuria on urinalysis   LOS: [place], [place] 11/6/20184:27 PM

## 2017-08-27 NOTE — Progress Notes (Signed)
Per Pharmacist, Colletta Maryland, PO amio being rescheduled for now to be given when gtt is turned off.

## 2017-08-27 NOTE — Progress Notes (Addendum)
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  -ongoing  GOAL:   Patient will meet greater than or equal to 90% of their needs   - not meeting   MONITOR:   PO intake, Supplement acceptance, Labs, Weight trends, I & O's  ASSESSMENT:   61 year old male with PMHx of HTN, DM type 2, neuropathy, CAD s/p coronary angioplasty with stent, CHF, chronic back pain, chronic leg pain, hx pancreatic cancer s/p distal pancreatectomy + splenectomy + excisional liver biopsy (negative) on 07/31/2017 (pathology showed T2N0 pancreatic cancer with negative margins; stage IB) who presented with N/V and abdominal pain admitted with suspected ileus, also found to be in A. Fib with RVR.   Pt initiated on clear liquid diet today; eating 25% of meals. RD will add Unjury to help pt meet his estimated protein needs. Pt is at high refeeding risk recommend monitor K, Mg, and P for 3 days. Per MD note, ileus resolved on radiographs and pt with possible colitis. Pt with diarrhea today; unsure if pt had diarrhea pta. Pt may benefit from creon therapy. No new weight since 11/4 but pt with 8lbs wt loss since admit. RD will continue to monitor for the need for nutritional support. Pt with hypokalemia today; monitor and supplement as needed per MD discretion. Pt with NGT in place to suction; output 935ml past 24 hrs.   Medications reviewed and include: heparin, insulin, Reglan, KCl, zosyn  Labs  reviewed: Na 147(H), K 2.4(L), Cl 89(L), BUN 46(H), creat 3.91(H), alkphos 25(L), alb 3.4(L) Wbc- 15.4(H) cbgs- 201, 151 x 48hrs AIC 8.8(H)- 11/3  Diet Order:  Diet clear liquid Room service appropriate? Yes; Fluid consistency: Thin  EDUCATION NEEDS:   No education needs have been identified at this time  Skin:   Incisions: closed incision to abdomen Other: s/p amputation of right transmetatarsal  Last BM:  11/6- type 7  Height:   Ht Readings from Last 1 Encounters:  08/24/17 5\' 11"  (1.803 m)    Weight:   Wt Readings from Last 1 Encounters:  08/25/17 136 lb 4.8 oz (61.8 kg)    Ideal Body Weight:  78.2 kg  BMI:  Body mass index is 19.01 kg/m.  Estimated Nutritional Needs:   Kcal:  1890-2180 (MSJ x 1.3-1.5)  Protein:  80-95 grams (1.3-1.5 grams/kg)  Fluid:  1.9-2.2 L/day (30-35 mL/kg)  Koleen Distance MS, RD, LDN Pager #- [phone] After Hours Pager: [phone]

## 2017-08-27 NOTE — Progress Notes (Signed)
CBG 402. Dr. Verdell Carmine notified. Instructed to give 15 units as required by sliding scale. Will educate patient regarding low carb diet.

## 2017-08-27 NOTE — Progress Notes (Signed)
No Name at [place] NAME: Richard [name]    MR#:  [id number]  DATE OF BIRTH:  Aug 11, 1956  SUBJECTIVE:   Patient's heart rate has improved. NG tube drainage improved, patient denies any abdominal pain, nausea, vomiting.  NG tube removed by general surgery. Patient tolerating clear liquid diet. Noted to be in acute kidney injury and severely hypokalemic still.  REVIEW OF SYSTEMS:    Review of Systems  Constitutional: Negative for fever and weight loss.  HENT: Negative for congestion, nosebleeds and tinnitus.   Eyes: Negative for blurred vision, double vision and redness.  Respiratory: Negative for cough, hemoptysis and shortness of breath.   Cardiovascular: Negative for chest pain, orthopnea, leg swelling and PND.  Gastrointestinal: Negative for abdominal pain, diarrhea, melena, nausea and vomiting.  Genitourinary: Negative for dysuria, hematuria and urgency.  Musculoskeletal: Negative for falls and joint pain.  Neurological: Negative for dizziness, tingling, sensory change, focal weakness, seizures, weakness and headaches.  Endo/Heme/Allergies: Negative for polydipsia. Does not bruise/bleed easily.  Psychiatric/Behavioral: Negative for depression and memory loss. The patient is not nervous/anxious.     Nutrition: Clear liquids Tolerating Diet: Yes Tolerating PT: Await Eval.   DRUG ALLERGIES:  No Known Allergies  VITALS:  Blood pressure 121/69, pulse 98, temperature 98.4 F (36.9 C), temperature source Oral, resp. rate 20, height 5' 11" (1.803 m), weight 61.8 kg (136 lb 4.8 oz), SpO2 99 %.  PHYSICAL EXAMINATION:   Physical Exam  GENERAL:  61 y.o.-year-old cachectic patient sitting up in chair in NAD.   EYES: Pupils equal, round, reactive to light and accommodation. No scleral icterus. Extraocular muscles intact.  HEENT: Head atraumatic, normocephalic.   NECK:  Supple, no jugular venous distention. No thyroid enlargement, no  tenderness.  LUNGS: Normal breath sounds bilaterally, no wheezing, rales, rhonchi. No use of accessory muscles of respiration.  CARDIOVASCULAR: S1, S2 Irregular. No murmurs, rubs, or gallops.  ABDOMEN: Soft, nontender, nondistended. Bowel sounds present. No organomegaly or mass. + mid-abdomen dressing in place from recent surgery.  EXTREMITIES: No cyanosis, clubbing or edema b/l.    NEUROLOGIC: Cranial nerves II through XII are intact. No focal Motor or sensory deficits b/l.   PSYCHIATRIC: The patient is alert and oriented x 3.  SKIN: No obvious rash, lesion, or ulcer.    LABORATORY PANEL:   CBC Recent Labs  Lab 08/27/17 0502  WBC 15.4*  HGB 12.6*  HCT 39.5*  PLT 340   ------------------------------------------------------------------------------------------------------------------  Chemistries  Recent Labs  Lab 08/26/17 1419 08/27/17 0502  NA  --  147*  K 2.4* 2.4*  CL  --  89*  CO2  --  37*  GLUCOSE  --  151*  BUN  --  46*  CREATININE  --  3.91*  CALCIUM  --  9.2  MG 2.1  --   AST  --  27  ALT  --  12*  ALKPHOS  --  25*  BILITOT  --  1.2   ------------------------------------------------------------------------------------------------------------------  Cardiac Enzymes Recent Labs  Lab 08/23/17 2325  TROPONINI 0.03*   ------------------------------------------------------------------------------------------------------------------  RADIOLOGY:  Ct Abdomen Pelvis W Contrast  Result Date: 08/26/2017 CLINICAL DATA:  Abdominal pain with recent postoperative ileus. Status post partial pancreatectomy and splenectomy. EXAM: CT ABDOMEN AND PELVIS WITH CONTRAST TECHNIQUE: Multidetector CT imaging of the abdomen and pelvis was performed using the standard protocol following bolus administration of intravenous contrast. Oral contrast also administered. CONTRAST:  121m ISOVUE-300 IOPAMIDOL (ISOVUE-300) INJECTION 61% COMPARISON:  August 23, 2017 FINDINGS: Lower chest:  There is a small right pleural effusion with posterior right base atelectasis. There are foci of coronary artery calcification. Hepatobiliary: There are scattered subcentimeter foci of decreased attenuation in the liver consistent with either small cysts or hamartomas. Liver otherwise appears unremarkable. Gallbladder wall is not appreciably thickened. There is no biliary duct dilatation. Pancreas: Patient is status post partial pancreatectomy. Remaining pancreas appears unremarkable. No mass or inflammatory focus evident. No pancreatic duct dilatation. No peripancreatic fluid. Spleen:  Surgically absent.  No perisplenic region fluid collection. Adrenals/Urinary Tract: Adrenals appear normal bilaterally. Kidneys bilaterally show no mass or hydronephrosis on either side. There is no renal or ureteral calculus on either side. Urinary bladder is midline with wall thickness borderline increased. Stomach/Bowel: Rectum distended with air. Nasogastric tube extends into the stomach. There is mild thickening of the wall of the distal gastric antrum and pylorus. No mass or fistula is seen in this area. There is mild thickening of the wall of the proximal to mid descending colon, possibly due to recent splenectomy. No other bowel wall thickening is noted. There is no appreciable bowel obstruction. No free air or portal venous air is evident currently. Vascular/Lymphatic: There is atherosclerotic calcification throughout much of the aorta and common iliac arteries. There appears to be hemodynamically significant obstructive disease in both proximal common iliac arteries. There is also extensive calcification in both internal iliac arteries. No aneurysm evident. Major mesenteric vessels show moderate atherosclerotic calcification but no obstruction. There is no adenopathy in the abdomen pelvis. There are subcentimeter inguinal lymph nodes which are stable and are considered nonspecific. Reproductive: There are a few prostatic  calculi. Prostate and seminal vesicles appear normal in size and contour. No pelvic mass evident. Other: There is slight ascites in the left posterior pelvis. There is also mild ascites in the right abdomen. Appendix appears unremarkable. No abscess evident in the abdomen or pelvis. Musculoskeletal: There is degenerative change at L5-S1 with vacuum phenomenon at this level. There is slight retrolisthesis of L5 on S1. There are no blastic or lytic bone lesions. There is no intramuscular or abdominal wall lesion evident. IMPRESSION: 1. Patient has had a significant portion of the body and tail of pancreas removed as well as splenectomy. No fluid collections or inflammation seen in the immediate postoperative regions. 2. Wall thickening in portions of the descending colon, consistent with a degree of colitis. This finding may in part be due to recent surgery involving the left abdomen. 3. Nasogastric tube in stomach. Wall thickening in the distal gastric antrum and pylorus may represent focal gastritis. 4. No demonstrable bowel obstruction. No abscess. Appendix appears unremarkable. 5. No evident free air. Mild ascites may be secondary to recent surgery. 6. Extensive aortoiliac atherosclerosis. There are foci of coronary artery calcification and calcification in multiple mesenteric arterial vessels. 7. Borderline urinary bladder wall thickening. There may be mild cystitis. Advise correlation with urinalysis. No renal or ureteral calculus. No hydronephrosis. There are a few prostatic calculi. 8.  Small right pleural effusion with mild right base atelectasis. Electronically Signed   By: Lowella Grip III M.D.   On: 08/26/2017 11:33     ASSESSMENT AND PLAN:   61 year old male with past medical history of pancreatic cancer status post recent pancreatectomy with splenectomy done at [facility], essential hypertension, diabetes, history of coronary artery disease, history of congestive heart failure who presents to the  hospital due to abdominal pain and noted to have an ileus based on a CAT  scan and also with the leukocytosis and tachycardia with meeting sepsis criteria.  1. Sepsis-patient met criteria given his leukocytosis, tachycardia. No clear source of the sepsis has been identified. Patient's urinalysis is been negative, chest x-ray shows no acute pathology. Patient had repeat CT scan of the abdomen and pelvis with contrast done yesterday showing no evidence of acute infectious process. -Clinically patient is afebrile and hemodynamically stable - cont. Zosyn empirically for now. WBC count trending down.   2. Ileus-this is the cause of patient's abdominal pain distention and nausea. Repeat CT scan of the abdomen and pelvis done on 08/26/17 showing some mild colitis which is likely related to his recent surgery with pancreatectomy and splenectomy. No evidence of ileus or bowel obstruction noted -Seen by surgery and NG tube removed today. Clinically feels better. No abdominal pain, nausea vomiting. NG tube has been removed. Started on a clear liquid diet.  3. Atrial flutter with RVR-this is secondary to his ileus and underlying illness. -We'll switch from IV amiodarone drip to oral amiodarone load. -Continue IV metoprolol for rate control. Defer anticoagulation to cardiology.  4. Diabetes type 2 without complication-sugars improved since yesterday. Continue sliding scale insulin. Appreciate diabetes coordinator input.  5. Leukocytosis-source unclear. Improving and will cont. To monitor WBC count.   6. Hypokalemia - due to increasing drainage from the NG tube.  -None the patient can take by mouth we'll place the patient on oral potassium supplements.  7. Acute kidney injury-secondary to the volume loss from increasing NG tube drainage. We'll change IV fluids to normal saline with potassium, and follow BUN/creatinine urine output. We'll get a nephrology consult.   All the records are reviewed and case  discussed with Care Management/Social Worker. Management plans discussed with the patient, family and they are in agreement.  CODE STATUS: Full code  DVT Prophylaxis: Lovenox  TOTAL CRITICAL CARE TIME TAKING CARE OF THIS PATIENT: 30 minutes.   POSSIBLE D/C IN 2-3 DAYS, DEPENDING ON CLINICAL CONDITION.   Henreitta Leber M.D on 08/27/2017 at 4:14 PM  Between 7am to 6pm - Pager - [phone]  After 6pm go to [email or web address] - Proofreader  [facility]  Office  [phone]  CC: Primary care physician; Patient, No Pcp Per

## 2017-08-27 NOTE — Progress Notes (Signed)
SUBJECTIVE: Denies chest pain or shortness of breath.    Vitals:   08/26/17 1550 08/26/17 2001 08/27/17 0432 08/27/17 0830  BP: (!) 151/86 (!) 158/93 (!) 145/66 (!) 146/74  Pulse: (!) 109 94 100 98  Resp: 18 18 18 20   Temp: 98 F (36.7 C) 98 F (36.7 C) 98.1 F (36.7 C) 98.3 F (36.8 C)  TempSrc: Oral Oral Oral Oral  SpO2: 99% 100% 100% 100%  Weight:      Height:        Intake/Output Summary (Last 24 hours) at 08/27/2017 0844 Last data filed at 08/27/2017 0504 Gross per 24 hour  Intake 3975.4 ml  Output 1300 ml  Net 2675.4 ml    LABS: Basic Metabolic Panel: Recent Labs    08/25/17 0511 08/26/17 1419 08/27/17 0502  NA 144  --  147*  K 2.8* 2.4* 2.4*  CL 99*  --  89*  CO2 33*  --  37*  GLUCOSE 201*  --  151*  BUN 25*  --  46*  CREATININE 0.78  --  3.91*  CALCIUM 9.5  --  9.2  MG  --  2.1  --    Liver Function Tests: Recent Labs    08/25/17 0511 08/27/17 0502  AST 16 27  ALT 15* 12*  ALKPHOS 26* 25*  BILITOT 1.1 1.2  PROT 7.6 7.4  ALBUMIN 3.7 3.4*   No results for input(s): LIPASE, AMYLASE in the last 72 hours. CBC: Recent Labs    08/25/17 0511 08/27/17 0502  WBC 20.9* 15.4*  NEUTROABS  --  12.4*  HGB 13.1 12.6*  HCT 40.3 39.5*  MCV 88.5 88.1  PLT 363 340   Cardiac Enzymes: No results for input(s): CKTOTAL, CKMB, CKMBINDEX, TROPONINI in the last 72 hours. BNP: Invalid input(s): POCBNP D-Dimer: No results for input(s): DDIMER in the last 72 hours. Hemoglobin A1C: No results for input(s): HGBA1C in the last 72 hours. Fasting Lipid Panel: No results for input(s): CHOL, HDL, LDLCALC, TRIG, CHOLHDL, LDLDIRECT in the last 72 hours. Thyroid Function Tests: No results for input(s): TSH, T4TOTAL, T3FREE, THYROIDAB in the last 72 hours.  Invalid input(s): FREET3 Anemia Panel: No results for input(s): VITAMINB12, FOLATE, FERRITIN, TIBC, IRON, RETICCTPCT in the last 72 hours.   PHYSICAL EXAM General: Well developed, well nourished, in no acute  distress HEENT:  Normocephalic and atramatic Neck:  No JVD.  Lungs: Clear bilaterally to auscultation and percussion. Heart: HRRR . Normal S1 and S2 without gallops or murmurs.  Abdomen: Bowel sounds are positive, abdomen soft and non-tender  Msk:  Back normal, normal gait. Normal strength and tone for age. Extremities: No clubbing, cyanosis or edema.   Neuro: Alert and oriented X 3. Psych:  Good affect, responds appropriately  TELEMETRY: NSR 97bpm  ASSESSMENT AND PLAN: History of Afib RVR, now in sinus rhythm, borderline tachycardic. Cannot take PO amiodarone due to NPO status and ileus. Acute kidney injury noted on this mornings labs. Advise continuing IV amiodarone drip until able to transition to PO dosing. Monitor HR. Will continue to follow.   Principal Problem:   Ileus [facility]) Active Problems:   Diabetes ([place])   Atrial flutter ([place])   CAD (coronary artery disease)   HTN (hypertension)   Atrial fibrillation ([place])    Richard Bathe, NP-C 08/27/2017 8:44 AM

## 2017-08-28 ENCOUNTER — Other Ambulatory Visit: Payer: Self-pay

## 2017-08-28 LAB — CULTURE, BLOOD (ROUTINE X 2)
Culture: NO GROWTH
Culture: NO GROWTH
Special Requests: ADEQUATE

## 2017-08-28 LAB — BASIC METABOLIC PANEL
Anion gap: 12 (ref 5–15)
BUN: 31 mg/dL — AB (ref 6–20)
CHLORIDE: 90 mmol/L — AB (ref 101–111)
CO2: 37 mmol/L — AB (ref 22–32)
CREATININE: 2.94 mg/dL — AB (ref 0.61–1.24)
Calcium: 8.1 mg/dL — ABNORMAL LOW (ref 8.9–10.3)
GFR calc Af Amer: 25 mL/min — ABNORMAL LOW (ref >60)
GFR calc non Af Amer: 21 mL/min — ABNORMAL LOW (ref >60)
GLUCOSE: 232 mg/dL — AB (ref 65–99)
POTASSIUM: 2.6 mmol/L — AB (ref 3.5–5.1)
Sodium: 139 mmol/L (ref 135–145)

## 2017-08-28 LAB — GLUCOSE, CAPILLARY
GLUCOSE-CAPILLARY: 212 mg/dL — AB (ref 65–99)
GLUCOSE-CAPILLARY: 196 mg/dL — AB (ref 65–99)
GLUCOSE-CAPILLARY: 218 mg/dL — AB (ref 65–99)
GLUCOSE-CAPILLARY: 254 mg/dL — AB (ref 65–99)
GLUCOSE-CAPILLARY: 274 mg/dL — AB (ref 65–99)
GLUCOSE-CAPILLARY: 233 mg/dL — AB (ref 65–99)
Glucose-Capillary: 170 mg/dL — ABNORMAL HIGH (ref 65–99)

## 2017-08-28 LAB — PHOSPHORUS: Phosphorus: 2.6 mg/dL (ref 2.5–4.6)

## 2017-08-28 LAB — MAGNESIUM: Magnesium: 1.7 mg/dL (ref 1.7–2.4)

## 2017-08-28 MED ORDER — INSULIN ASPART 100 UNIT/ML ~~LOC~~ SOLN
0.0000 [IU] | Freq: Three times a day (TID) | SUBCUTANEOUS | Status: DC
  Administered 2017-08-28: 8 [IU] via SUBCUTANEOUS
  Administered 2017-08-29: 5 [IU] via SUBCUTANEOUS
  Administered 2017-08-29: 8 [IU] via SUBCUTANEOUS
  Filled 2017-08-28 (×3): qty 1

## 2017-08-28 MED ORDER — ENSURE ENLIVE PO LIQD
237.0000 mL | Freq: Two times a day (BID) | ORAL | Status: DC
  Administered 2017-08-29: 237 mL via ORAL

## 2017-08-28 MED ORDER — APIXABAN 5 MG PO TABS
5.0000 mg | ORAL_TABLET | Freq: Two times a day (BID) | ORAL | Status: DC
  Administered 2017-08-28 – 2017-08-29 (×2): 5 mg via ORAL
  Filled 2017-08-28 (×2): qty 1

## 2017-08-28 MED ORDER — INSULIN GLARGINE 100 UNIT/ML ~~LOC~~ SOLN
15.0000 [IU] | Freq: Every day | SUBCUTANEOUS | Status: DC
  Administered 2017-08-29: 15 [IU] via SUBCUTANEOUS
  Filled 2017-08-28 (×2): qty 0.15

## 2017-08-28 MED ORDER — POTASSIUM CHLORIDE CRYS ER 20 MEQ PO TBCR
40.0000 meq | EXTENDED_RELEASE_TABLET | Freq: Once | ORAL | Status: AC
  Administered 2017-08-28: 40 meq via ORAL
  Filled 2017-08-28: qty 2

## 2017-08-28 MED ORDER — INSULIN ASPART 100 UNIT/ML ~~LOC~~ SOLN
0.0000 [IU] | Freq: Every day | SUBCUTANEOUS | Status: DC
  Administered 2017-08-28: 2 [IU] via SUBCUTANEOUS
  Filled 2017-08-28: qty 1

## 2017-08-28 MED ORDER — MAGNESIUM SULFATE 2 GM/50ML IV SOLN
2.0000 g | Freq: Once | INTRAVENOUS | Status: AC
  Administered 2017-08-28: 2 g via INTRAVENOUS
  Filled 2017-08-28: qty 50

## 2017-08-28 MED ORDER — PIPERACILLIN-TAZOBACTAM 3.375 G IVPB: 3.3750 g | Freq: Three times a day (TID) | INTRAVENOUS | Status: DC

## 2017-08-28 NOTE — Progress Notes (Signed)
[facility]  ROUNDING NOTE   Subjective:   Patient tolerating more foods.   Creatinine 2.94 (3.91)  K 2.6  Objective:  Vital signs in last 24 hours:  Temp:  [98.5 F (36.9 C)-99.1 F (37.3 C)] 99 F (37.2 C) (11/07 1216) Pulse Rate:  [76-85] 76 (11/07 1216) Resp:  [15-18] 18 (11/07 0759) BP: (132-178)/(62-71) 178/68 (11/07 1216) SpO2:  [97 %-100 %] 99 % (11/07 1216)  Weight change:  Filed Weights   08/24/17 0242 08/24/17 1141 08/25/17 1513  Weight: 64 kg (141 lb 1 oz) 62.8 kg (138 lb 7.2 oz) 61.8 kg (136 lb 4.8 oz)    Intake/Output: I/O last 3 completed shifts: In: 840 [P.O.:840] Out: 1670 [Urine:420; Emesis/NG output:1250]   Intake/Output this shift:  Total I/O In: 360 [P.O.:360] Out: 1450 [Urine:1450]  Physical Exam: General: NAD,   Head: Normocephalic, atraumatic. Moist oral mucosal membranes  Eyes: Anicteric, PERRL  Neck: Supple, trachea midline  Lungs:  Clear to auscultation  Heart: Regular rate and rhythm  Abdomen:  Soft, nontender,   Extremities: No  peripheral edema.  Neurologic: Nonfocal, moving all four extremities  Skin: No lesions       Basic Metabolic Panel: Recent Labs  Lab 08/23/17 2325  08/24/17 0557 08/25/17 0511 08/26/17 1419 08/27/17 0502 08/28/17 0451  NA 137  --  138 144  --  147* 139  K 5.1   < > 4.2 2.8* 2.4* 2.4* 2.6*  CL 105  --  103 99*  --  89* 90*  CO2 18*  --  17* 33*  --  37* 37*  GLUCOSE 308*  --  324* 201*  --  151* 232*  BUN 30*  --  27* 25*  --  46* 31*  CREATININE 1.13  --  1.05 0.78  --  3.91* 2.94*  CALCIUM 7.8*  --  9.0 9.5  --  9.2 8.1*  MG  --   --   --   --  2.1  --  1.7  PHOS  --   --   --   --   --   --  2.6   < > = values in this interval not displayed.    Liver Function Tests: Recent Labs  Lab 08/24/17 0557 08/25/17 0511 08/27/17 0502  AST 17 16 27   ALT 18 15* 12*  ALKPHOS 25* 26* 25*  BILITOT 1.8* 1.1 1.2  PROT 7.2 7.6 7.4  ALBUMIN 3.7 3.7 3.4*   Recent Labs  Lab  08/24/17 0557  LIPASE 15  AMYLASE 189*   No results for input(s): AMMONIA in the last 168 hours.  CBC: Recent Labs  Lab 08/23/17 1838 08/24/17 0557 08/25/17 0511 08/27/17 0502  WBC 25.1* 21.7* 20.9* 15.4*  NEUTROABS  --   --   --  12.4*  HGB 13.7 12.4* 13.1 12.6*  HCT 42.9 39.1* 40.3 39.5*  MCV 89.4 89.8 88.5 88.1  PLT 471* 391 363 340    Cardiac Enzymes: Recent Labs  Lab 08/23/17 2325  TROPONINI 0.03*    BNP: Invalid input(s): POCBNP  CBG: Recent Labs  Lab 08/28/17 0034 08/28/17 0410 08/28/17 0803 08/28/17 1132 08/28/17 1554  GLUCAP 170* 212* 196* 41* 254*    Microbiology: Results for orders placed or performed during the hospital encounter of 08/23/17  Culture, blood (routine x 2)     Status: None   Collection Time: 08/23/17  6:30 PM  Result Value Ref Range Status   Specimen Description BLOOD RIGHT FOREARM  Final   Special Requests   Final    BOTTLES DRAWN AEROBIC AND ANAEROBIC Blood Culture results may not be optimal due to an inadequate volume of blood received in culture bottles   Culture NO GROWTH 5 DAYS  Final   Report Status 08/28/2017 FINAL  Final  Culture, blood (routine x 2)     Status: None   Collection Time: 08/23/17  6:39 PM  Result Value Ref Range Status   Specimen Description BLOOD LEFT FOREARM  Final   Special Requests   Final    BOTTLES DRAWN AEROBIC AND ANAEROBIC Blood Culture adequate volume   Culture NO GROWTH 5 DAYS  Final   Report Status 08/28/2017 FINAL  Final  MRSA PCR Screening     Status: None   Collection Time: 08/24/17  9:43 AM  Result Value Ref Range Status   MRSA by PCR NEGATIVE NEGATIVE Final    Comment:        The GeneXpert MRSA Assay (FDA approved for NASAL specimens only), is one component of a comprehensive MRSA colonization surveillance program. It is not intended to diagnose MRSA infection nor to guide or monitor treatment for MRSA infections.     Coagulation Studies: No results for input(s):  LABPROT, INR in the last 72 hours.  Urinalysis: No results for input(s): COLORURINE, LABSPEC, PHURINE, GLUCOSEU, HGBUR, BILIRUBINUR, KETONESUR, PROTEINUR, UROBILINOGEN, NITRITE, LEUKOCYTESUR in the last 72 hours.  Invalid input(s): APPERANCEUR    Imaging: No results found.   Medications:   . 0.9 % NaCl with KCl 20 mEq / L 75 mL/hr at 08/28/17 1614   . amiodarone  400 mg Oral BID  . apixaban  5 mg Oral BID  . feeding supplement (ENSURE ENLIVE)  237 mL Oral BID BM  . insulin aspart  0-15 Units Subcutaneous TID WC  . insulin aspart  0-5 Units Subcutaneous QHS  . [START ON 08/29/2017] insulin glargine  15 Units Subcutaneous Daily  . metoCLOPramide (REGLAN) injection  5 mg Intravenous Q6H  . potassium chloride  20 mEq Oral TID   acetaminophen **OR** acetaminophen, HYDROmorphone (DILAUDID) injection, metoprolol tartrate, morphine injection, ondansetron **OR** ondansetron (ZOFRAN) IV  Assessment/ Plan:  Mr. Richard [name] is a 61 y.o. black male with pancreatectomy and splenectomy on 10/10 at [facility] for pancreatic cancer, complicated by post op ileus, diabetes mellitus type II, hypertension, coronary artery disease, and congestive heart failure , who was admitted to [facility]   1. Acute Renal Failure with hypokalemia, hyperkalemia and metabolic alkalosis: no history of chronic kidney disease.  IV contrast exposure on 11/5.  Also with prerenal azotemia  Electrolytes are consistent with contraction alkalosis and GI losses Sodium improved. Continues to have alkalosis and hypokalemia.  - Agree with supportive care - Continue IV fluids: NS with 20KCl at 70mL/hr - replace Potassium - Encourage PO intake.   2. Hypertension: blood pressure with elevations.   3. Diabetes mellitus type II with renal manifestations: glucosuria on urinalysis     LOS: [place], [place] 11/7/20184:31 PM

## 2017-08-28 NOTE — Progress Notes (Signed)
ANTICOAGULATION CONSULT NOTE - Initial Consult  Pharmacy Consult for apixaban Indication: atrial fibrillation  No Known Allergies  Patient Measurements: Height: 5\' 11"  (180.3 cm) Weight: 136 lb 4.8 oz (61.8 kg) IBW/kg (Calculated) : 75.3  Vital Signs: Temp: 99 F (37.2 C) (11/07 1216) Temp Source: Oral (11/07 1216) BP: 178/68 (11/07 1216) Pulse Rate: 76 (11/07 1216)  Labs: Recent Labs    08/27/17 0502 08/28/17 0451  HGB 12.6*  --   HCT 39.5*  --   PLT 340  --   CREATININE 3.91* 2.94*    Estimated Creatinine Clearance: 22.8 mL/min (A) (by C-G formula based on SCr of 2.94 mg/dL (H)).   Medical History: Past Medical History:  Diagnosis Date  . CHF (congestive heart failure) ([place])   . Chronic back pain   . Chronic leg pain   . Coronary artery disease   . Diabetes mellitus without complication ([place])   . Hypertension   . Neuropathy     Medications:  Medications Prior to Admission  Medication Sig Dispense Refill Last Dose  . aspirin EC 81 MG tablet Take 1 tablet (81 mg total) by mouth daily. 30 tablet 0 08/23/2017 at Unknown time  . atorvastatin (LIPITOR) 40 MG tablet Take 40 mg by mouth every evening.     . enoxaparin (LOVENOX) 40 MG/0.4ML injection Inject 40 mg into the skin daily.     Marland Kitchen gabapentin (NEURONTIN) 800 MG tablet Take 800 mg by mouth 3 (three) times daily.   08/23/2017 at Unknown time  . HYDROmorphone (DILAUDID) 2 MG tablet Take 2 mg by mouth every 6 (six) hours as needed for pain.     Marland Kitchen insulin glargine (LANTUS) 100 UNIT/ML injection Inject 0.2 mLs (20 Units total) into the skin at bedtime. Sliding scale. Max out at 50 units (Patient taking differently: Inject 20 Units into the skin daily. Sliding scale. Max out at 50 units) 10 mL 0 08/23/2017 at Unknown time  . insulin lispro (HUMALOG) 100 UNIT/ML injection Inject 3 Units into the skin 3 (three) times daily with meals.   08/23/2017 at Unknown time  . metFORMIN (GLUCOPHAGE) 500 MG tablet Take 500 mg by mouth  2 (two) times daily with a meal.   08/23/2017 at Unknown time  . metoCLOPramide (REGLAN) 10 MG tablet Take 10 mg by mouth 3 (three) times daily as needed for nausea.     . metoprolol tartrate (LOPRESSOR) 25 MG tablet Take 0.5 tablets (12.5 mg total) by mouth 2 (two) times daily. (Patient taking differently: Take 25 mg by mouth 2 (two) times daily. ) 30 tablet 0 08/23/2017 at Unknown time  . ondansetron (ZOFRAN) 4 MG tablet Take 4 mg by mouth every 8 (eight) hours as needed for nausea or vomiting.   prn  . pantoprazole (PROTONIX) 40 MG tablet Take 40 mg by mouth 2 (two) times daily.     . cyclobenzaprine (FLEXERIL) 10 MG tablet Take 1 tablet (10 mg total) by mouth 3 (three) times daily as needed for muscle spasms. (Patient not taking: Reported on 08/23/2017) 12 tablet 0 Not Taking at Unknown time  . gabapentin (NEURONTIN) 600 MG tablet Take 1 tablet (600 mg total) by mouth 3 (three) times daily. 50 tablet 0 Taking  . GLOBAL EASE INJECT PEN NEEDLES 31G X 5 MM MISC    Not Taking  . HYDROcodone-acetaminophen (NORCO) 5-325 MG tablet Take 1 tablet by mouth every 4 (four) hours as needed for moderate pain. (Patient not taking: Reported on 08/23/2017) 6 tablet 0  Not Taking at Unknown time  . insulin regular (NOVOLIN R,HUMULIN R) 100 units/mL injection Inject 0.08 mLs (8 Units total) into the skin 3 (three) times daily before meals. (Patient not taking: Reported on 08/23/2017) 10 mL 11 Not Taking at Unknown time  . Needles & Syringes MISC Use as directed 100 each 3 Taking  . pravastatin (PRAVACHOL) 80 MG tablet Take 1 tablet (80 mg total) by mouth at bedtime. (Patient not taking: Reported on 08/23/2017) 30 tablet 0 Not Taking at Unknown time   Scheduled:  . amiodarone  400 mg Oral BID  . apixaban  5 mg Oral BID  . feeding supplement (ENSURE ENLIVE)  237 mL Oral BID BM  . insulin aspart  0-15 Units Subcutaneous TID WC  . insulin aspart  0-5 Units Subcutaneous QHS  . [START ON 08/29/2017] insulin glargine  15 Units  Subcutaneous Daily  . metoCLOPramide (REGLAN) injection  5 mg Intravenous Q6H  . potassium chloride  20 mEq Oral TID   Infusions:  . 0.9 % NaCl with KCl 20 mEq / L 75 mL/hr at 08/28/17 1614   PRN: acetaminophen **OR** acetaminophen, HYDROmorphone (DILAUDID) injection, metoprolol tartrate, morphine injection, ondansetron **OR** ondansetron (ZOFRAN) IV Anti-infectives (From admission, onward)   Start     Dose/Rate Route Frequency Ordered Stop   08/28/17 1800  piperacillin-tazobactam (ZOSYN) IVPB 3.375 g  Status:  Discontinued     3.375 g 12.5 mL/hr over 240 Minutes Intravenous Every 8 hours 08/28/17 1257 08/28/17 1451   08/27/17 2200  piperacillin-tazobactam (ZOSYN) IVPB 3.375 g  Status:  Discontinued     3.375 g 12.5 mL/hr over 240 Minutes Intravenous Every 12 hours 08/27/17 1045 08/28/17 1257   08/24/17 1200  vancomycin (VANCOCIN) IVPB 1000 mg/200 mL premix  Status:  Discontinued     1,000 mg 200 mL/hr over 60 Minutes Intravenous Every 18 hours 08/23/17 2308 08/25/17 1024   08/24/17 0600  piperacillin-tazobactam (ZOSYN) IVPB 3.375 g  Status:  Discontinued     3.375 g 12.5 mL/hr over 240 Minutes Intravenous Every 8 hours 08/23/17 2308 08/27/17 1045   08/23/17 2130  piperacillin-tazobactam (ZOSYN) IVPB 3.375 g     3.375 g 100 mL/hr over 30 Minutes Intravenous  Once 08/23/17 2118 08/23/17 2259   08/23/17 2130  vancomycin (VANCOCIN) IVPB 1000 mg/200 mL premix     1,000 mg 200 mL/hr over 60 Minutes Intravenous  Once 08/23/17 2118 08/24/17 0300      Assessment: Patient is a 61 year old male who has been presently anticoagulated on heparin 5000u subq q8h, but Dr Bridgett Larsson has consulted pharmacy for apixaban dosing in the setting of atrial flutter.   Goal of Therapy:  anticoagulation for atrial fib/flutter Monitor platelets by anticoagulation protocol: Yes   Plan:  Apixaban 5mg  po bid beginning now. Will continue to monitor CBC per protocol   Donna Christen Ladarrian Asencio 08/28/2017,4:16 PM

## 2017-08-28 NOTE — Progress Notes (Addendum)
[place] [facility] [place] NAME: Richard [name]    MR#:  [id number]  DATE OF BIRTH:  1956-07-28  SUBJECTIVE:   The patient tolerated liquid diet, advance to soft diet per Dr. Burt Knack. REVIEW OF SYSTEMS:    Review of Systems  Constitutional: Negative for fever and weight loss.  HENT: Negative for congestion, nosebleeds and tinnitus.   Eyes: Negative for blurred vision, double vision and redness.  Respiratory: Negative for cough, hemoptysis and shortness of breath.   Cardiovascular: Negative for chest pain, orthopnea, leg swelling and PND.  Gastrointestinal: Negative for abdominal pain, diarrhea, melena, nausea and vomiting.  Genitourinary: Negative for dysuria, hematuria and urgency.  Musculoskeletal: Negative for falls and joint pain.  Neurological: Negative for dizziness, tingling, sensory change, focal weakness, seizures, weakness and headaches.  Endo/Heme/Allergies: Negative for polydipsia. Does not bruise/bleed easily.  Psychiatric/Behavioral: Negative for depression and memory loss. The patient is not nervous/anxious.     Nutrition: soft diet. Tolerating Diet: Yes Tolerating PT: Await Eval.   DRUG ALLERGIES:  No Known Allergies  VITALS:  Blood pressure (!) 178/68, pulse 76, temperature 99 F (37.2 C), temperature source Oral, resp. rate 18, height 5' 11"  (1.803 m), weight 136 lb 4.8 oz (61.8 kg), SpO2 99 %.  PHYSICAL EXAMINATION:   Physical Exam  GENERAL:  61 y.o.-year-old cachectic patient in bed and in NAD.   EYES: Pupils equal, round, reactive to light and accommodation. No scleral icterus. Extraocular muscles intact.  HEENT: Head atraumatic, normocephalic.   NECK:  Supple, no jugular venous distention. No thyroid enlargement, no tenderness.  LUNGS: Normal breath sounds bilaterally, no wheezing, rales, rhonchi. No use of accessory muscles of respiration.  CARDIOVASCULAR: S1, S2 Irregular. No murmurs, rubs, or gallops.  ABDOMEN:  Soft, nontender, nondistended. Bowel sounds present. No organomegaly or mass. + mid-abdomen dressing in place from recent surgery.  EXTREMITIES: No cyanosis, clubbing or edema b/l.    NEUROLOGIC: Cranial nerves II through XII are intact. No focal Motor or sensory deficits b/l.   PSYCHIATRIC: The patient is alert and oriented x 3.  SKIN: No obvious rash, lesion, or ulcer.    LABORATORY PANEL:   CBC Recent Labs  Lab 08/27/17 0502  WBC 15.4*  HGB 12.6*  HCT 39.5*  PLT 340   ------------------------------------------------------------------------------------------------------------------  Chemistries  Recent Labs  Lab 08/27/17 0502 08/28/17 0451  NA 147* 139  K 2.4* 2.6*  CL 89* 90*  CO2 37* 37*  GLUCOSE 151* 232*  BUN 46* 31*  CREATININE 3.91* 2.94*  CALCIUM 9.2 8.1*  MG  --  1.7  AST 27  --   ALT 12*  --   ALKPHOS 25*  --   BILITOT 1.2  --    ------------------------------------------------------------------------------------------------------------------  Cardiac Enzymes Recent Labs  Lab 08/23/17 2325  TROPONINI 0.03*   ------------------------------------------------------------------------------------------------------------------  RADIOLOGY:  No results found.   ASSESSMENT AND PLAN:   61 year old male with past medical history of pancreatic cancer status post recent pancreatectomy with splenectomy done at [facility], essential hypertension, diabetes, history of coronary artery disease, history of congestive heart failure who presents to the hospital due to abdominal pain and noted to have an ileus based on a CAT scan and also with the leukocytosis and tachycardia with meeting sepsis criteria.  1. Sepsis-patient met criteria given his leukocytosis, tachycardia. No clear source of the sepsis has been identified. Patient's urinalysis is been negative, chest x-ray shows no acute pathology. Patient had repeat CT scan of the abdomen  and pelvis with contrast done   showing no evidence of acute infectious process. -Clinically patient is afebrile and hemodynamically stable discontinue. Zosyn. WBC count trending down.   2. Ileus-this is the cause of patient's abdominal pain distention and nausea. Repeat CT scan of the abdomen and pelvis done on 08/26/17 showing some mild colitis which is likely related to his recent surgery with pancreatectomy and splenectomy. No evidence of ileus or bowel obstruction noted -Seen by surgery and NG tube removed. Clinically feels better. No abdominal pain, nausea vomiting. NG tube has been removed. Started on a clear liquid diet and advance to soft diet.  3. Atrial flutter with RVR-this is secondary to his ileus and underlying illness. switched from IV amiodarone drip to oral amiodarone load. -Continue IV metoprolol for rate control. Continue heparin for now and advise transition to Eliquis per cardiology.  4. Diabetes type 2 without complication-sugars improved. Continue sliding scale insulin. Appreciate diabetes coordinator input.  5. Leukocytosis-source unclear. Improving and f/u CBC.  6. Hypokalemia - due to increasing drainage from the NG tube.  continue oral potassium supplements. F/u K.  7. Acute kidney injury-secondary to the volume loss from increasing NG tube drainage and IV contrast. continue IV normal saline with potassium, and follow BUN/creatinine urine output.   Discussed with Dr. Juleen China.  All the records are reviewed and case discussed with Care Management/Social Worker. Management plans discussed with the patient, family and they are in agreement.  CODE STATUS: Full code  DVT Prophylaxis: Lovenox  TOTAL CRITICAL CARE TIME TAKING CARE OF THIS PATIENT: 36 minutes.   POSSIBLE D/C IN 2 DAYS, DEPENDING ON CLINICAL CONDITION.   [name] M.D on 08/28/2017 at 3:05 PM  Between 7am to 6pm - Pager - [phone]  After 6pm go to [email or web address] - Proofreader  [facility] [place]  [facility]  Office  [phone]  CC: Primary care physician; Patient, No Pcp Per

## 2017-08-28 NOTE — Progress Notes (Signed)
Pt transitioned to soft foods this morning. Tolerated breakfast well. Will continue to monitor.

## 2017-08-28 NOTE — Progress Notes (Signed)
Pharmacy Antibiotic Note  Richard [name] is a 61 y.o. male admitted on 08/23/2017 with sepsis.  Pharmacy has been Zosyn dosing.  Plan: Day 5 IV Abx. Scr now 3.91. Will adjust Zosyn to 3.375g IV q12h (4 hour infusion) due to current CrCl <32ml/min   08/28/2017 SCr improved to 2.94 mg/dL - adjust Zosyn to 3.375 gm IV Q8H EI. Pharmacy will continue to follow and adjust as needed.     Height: 5\' 11"  (180.3 cm) Weight: 136 lb 4.8 oz (61.8 kg) IBW/kg (Calculated) : 75.3  Temp (24hrs), Avg:98.8 F (37.1 C), Min:98.5 F (36.9 C), Max:99.1 F (37.3 C)  Recent Labs  Lab 08/23/17 1838 08/23/17 1839 08/23/17 2216 08/23/17 2325 08/24/17 0557 08/25/17 0511 08/27/17 0502 08/28/17 0451  WBC 25.1*  --   --   --  21.7* 20.9* 15.4*  --   CREATININE 1.24  --   --  1.13 1.05 0.78 3.91* 2.94*  LATICACIDVEN  --  2.1* 1.5  --   --   --   --   --     Estimated Creatinine Clearance: 22.8 mL/min (A) (by C-G formula based on SCr of 2.94 mg/dL (H)).    No Known Allergies  Antimicrobials this admission: Vancomycin 11/2   >> 11/4 Zosyn 11/2 >>   Dose adjustments this admission:  Microbiology results: 11/2 BCx: NGTD 11/2 UA: (-) 11/2 CXR: No active disease Thank you for allowing pharmacy to be a part of this patient's care.  Laural Benes, PharmD, BCPS Clinical Pharmacist 08/28/2017 12:58 PM

## 2017-08-28 NOTE — Progress Notes (Signed)
CC: Ileus Subjective: This patient admitted to the hospital with postop ileus and multiple electrolyte abnormalities.  Denies any abdominal pain today.  His nasogastric tube was removed yesterday and is tolerating a liquid diet.  He wants to advance his diet.  He states he is passing gas.  Objective: Vital signs in last 24 hours: Temp:  [98.3 F (36.8 C)-99.1 F (37.3 C)] 99.1 F (37.3 C) (11/07 0759) Pulse Rate:  [80-98] 80 (11/07 0759) Resp:  [15-20] 18 (11/07 0759) BP: (121-146)/(62-74) 136/71 (11/07 0759) SpO2:  [97 %-100 %] 98 % (11/07 0759) Last BM Date: 08/27/17  Intake/Output from previous day: 11/06 0701 - 11/07 0700 In: 840 [P.O.:840] Out: 1070 [Urine:220; Emesis/NG output:850] Intake/Output this shift: No intake/output data recorded.  Physical exam:  Soft nondistended abdomen with clean wound nontender Vital signs are stable Nontender calves lab Results: CBC  Recent Labs    08/27/17 0502  WBC 15.4*  HGB 12.6*  HCT 39.5*  PLT 340   BMET Recent Labs    08/27/17 0502 08/28/17 0451  NA 147* 139  K 2.4* 2.6*  CL 89* 90*  CO2 37* 37*  GLUCOSE 151* 232*  BUN 46* 31*  CREATININE 3.91* 2.94*  CALCIUM 9.2 8.1*   PT/INR No results for input(s): LABPROT, INR in the last 72 hours. ABG No results for input(s): PHART, HCO3 in the last 72 hours.  Invalid input(s): PCO2, PO2  Studies/Results: Ct Abdomen Pelvis W Contrast  Result Date: 08/26/2017 CLINICAL DATA:  Abdominal pain with recent postoperative ileus. Status post partial pancreatectomy and splenectomy. EXAM: CT ABDOMEN AND PELVIS WITH CONTRAST TECHNIQUE: Multidetector CT imaging of the abdomen and pelvis was performed using the standard protocol following bolus administration of intravenous contrast. Oral contrast also administered. CONTRAST:  168mL ISOVUE-300 IOPAMIDOL (ISOVUE-300) INJECTION 61% COMPARISON:  August 23, 2017 FINDINGS: Lower chest: There is a small right pleural effusion with posterior  right base atelectasis. There are foci of coronary artery calcification. Hepatobiliary: There are scattered subcentimeter foci of decreased attenuation in the liver consistent with either small cysts or hamartomas. Liver otherwise appears unremarkable. Gallbladder wall is not appreciably thickened. There is no biliary duct dilatation. Pancreas: Patient is status post partial pancreatectomy. Remaining pancreas appears unremarkable. No mass or inflammatory focus evident. No pancreatic duct dilatation. No peripancreatic fluid. Spleen:  Surgically absent.  No perisplenic region fluid collection. Adrenals/Urinary Tract: Adrenals appear normal bilaterally. Kidneys bilaterally show no mass or hydronephrosis on either side. There is no renal or ureteral calculus on either side. Urinary bladder is midline with wall thickness borderline increased. Stomach/Bowel: Rectum distended with air. Nasogastric tube extends into the stomach. There is mild thickening of the wall of the distal gastric antrum and pylorus. No mass or fistula is seen in this area. There is mild thickening of the wall of the proximal to mid descending colon, possibly due to recent splenectomy. No other bowel wall thickening is noted. There is no appreciable bowel obstruction. No free air or portal venous air is evident currently. Vascular/Lymphatic: There is atherosclerotic calcification throughout much of the aorta and common iliac arteries. There appears to be hemodynamically significant obstructive disease in both proximal common iliac arteries. There is also extensive calcification in both internal iliac arteries. No aneurysm evident. Major mesenteric vessels show moderate atherosclerotic calcification but no obstruction. There is no adenopathy in the abdomen pelvis. There are subcentimeter inguinal lymph nodes which are stable and are considered nonspecific. Reproductive: There are a few prostatic calculi. Prostate and seminal vesicles  appear normal in  size and contour. No pelvic mass evident. Other: There is slight ascites in the left posterior pelvis. There is also mild ascites in the right abdomen. Appendix appears unremarkable. No abscess evident in the abdomen or pelvis. Musculoskeletal: There is degenerative change at L5-S1 with vacuum phenomenon at this level. There is slight retrolisthesis of L5 on S1. There are no blastic or lytic bone lesions. There is no intramuscular or abdominal wall lesion evident. IMPRESSION: 1. Patient has had a significant portion of the body and tail of pancreas removed as well as splenectomy. No fluid collections or inflammation seen in the immediate postoperative regions. 2. Wall thickening in portions of the descending colon, consistent with a degree of colitis. This finding may in part be due to recent surgery involving the left abdomen. 3. Nasogastric tube in stomach. Wall thickening in the distal gastric antrum and pylorus may represent focal gastritis. 4. No demonstrable bowel obstruction. No abscess. Appendix appears unremarkable. 5. No evident free air. Mild ascites may be secondary to recent surgery. 6. Extensive aortoiliac atherosclerosis. There are foci of coronary artery calcification and calcification in multiple mesenteric arterial vessels. 7. Borderline urinary bladder wall thickening. There may be mild cystitis. Advise correlation with urinalysis. No renal or ureteral calculus. No hydronephrosis. There are a few prostatic calculi. 8.  Small right pleural effusion with mild right base atelectasis. Electronically Signed   By: Lowella Grip III M.D.   On: 08/26/2017 11:33    Anti-infectives: Anti-infectives (From admission, onward)   Start     Dose/Rate Route Frequency Ordered Stop   08/27/17 2200  piperacillin-tazobactam (ZOSYN) IVPB 3.375 g     3.375 g 12.5 mL/hr over 240 Minutes Intravenous Every 12 hours 08/27/17 1045     08/24/17 1200  vancomycin (VANCOCIN) IVPB 1000 mg/200 mL premix  Status:   Discontinued     1,000 mg 200 mL/hr over 60 Minutes Intravenous Every 18 hours 08/23/17 2308 08/25/17 1024   08/24/17 0600  piperacillin-tazobactam (ZOSYN) IVPB 3.375 g  Status:  Discontinued     3.375 g 12.5 mL/hr over 240 Minutes Intravenous Every 8 hours 08/23/17 2308 08/27/17 1045   08/23/17 2130  piperacillin-tazobactam (ZOSYN) IVPB 3.375 g     3.375 g 100 mL/hr over 30 Minutes Intravenous  Once 08/23/17 2118 08/23/17 2259   08/23/17 2130  vancomycin (VANCOCIN) IVPB 1000 mg/200 mL premix     1,000 mg 200 mL/hr over 60 Minutes Intravenous  Once 08/23/17 2118 08/24/17 0300      Assessment/Plan:  Labs are reviewed.  Kidney injury and hypokalemia persist.  Otherwise patient is doing well and I will advance his diet to soft diet there are no surgical needs at this time and I will sign off please reconsult if needed.  Florene Glen, MD, FACS  08/28/2017

## 2017-08-28 NOTE — Progress Notes (Signed)
SUBJECTIVE: Pt is feeling well, no chest pain, no abdominal pain. Tolerating soft diet well.    Vitals:   08/27/17 1633 08/27/17 2004 08/28/17 0408 08/28/17 0759  BP: 132/63 (!) 142/62 (!) 144/70 136/71  Pulse: 85 83 81 80  Resp: 15   18  Temp: 98.7 F (37.1 C) 98.7 F (37.1 C) 98.5 F (36.9 C) 99.1 F (37.3 C)  TempSrc: Oral Oral Oral Oral  SpO2: 99% 97% 100% 98%  Weight:      Height:        Intake/Output Summary (Last 24 hours) at 08/28/2017 0842 Last data filed at 08/27/2017 1438 Gross per 24 hour  Intake 840 ml  Output 1070 ml  Net -230 ml    LABS: Basic Metabolic Panel: Recent Labs    08/26/17 1419 08/27/17 0502 08/28/17 0451  NA  --  147* 139  K 2.4* 2.4* 2.6*  CL  --  89* 90*  CO2  --  37* 37*  GLUCOSE  --  151* 232*  BUN  --  46* 31*  CREATININE  --  3.91* 2.94*  CALCIUM  --  9.2 8.1*  MG 2.1  --  1.7  PHOS  --   --  2.6   Liver Function Tests: Recent Labs    08/27/17 0502  AST 27  ALT 12*  ALKPHOS 25*  BILITOT 1.2  PROT 7.4  ALBUMIN 3.4*   No results for input(s): LIPASE, AMYLASE in the last 72 hours. CBC: Recent Labs    08/27/17 0502  WBC 15.4*  NEUTROABS 12.4*  HGB 12.6*  HCT 39.5*  MCV 88.1  PLT 340   Cardiac Enzymes: No results for input(s): CKTOTAL, CKMB, CKMBINDEX, TROPONINI in the last 72 hours. BNP: Invalid input(s): POCBNP D-Dimer: No results for input(s): DDIMER in the last 72 hours. Hemoglobin A1C: No results for input(s): HGBA1C in the last 72 hours. Fasting Lipid Panel: No results for input(s): CHOL, HDL, LDLCALC, TRIG, CHOLHDL, LDLDIRECT in the last 72 hours. Thyroid Function Tests: No results for input(s): TSH, T4TOTAL, T3FREE, THYROIDAB in the last 72 hours.  Invalid input(s): FREET3 Anemia Panel: No results for input(s): VITAMINB12, FOLATE, FERRITIN, TIBC, IRON, RETICCTPCT in the last 72 hours.   PHYSICAL EXAM General: Well developed, well nourished, in no acute distress HEENT:  Normocephalic and  atramatic Neck:  No JVD.  Lungs: Clear bilaterally to auscultation and percussion. Heart: HRRR . Normal S1 and S2 without gallops or murmurs.  Abdomen: Bowel sounds are positive, abdomen soft and non-tender  Msk:  Back normal, normal gait. Normal strength and tone for age. Extremities: No clubbing, cyanosis or edema.   Neuro: Alert and oriented X 3. Psych:  Good affect, responds appropriately  TELEMETRY: NSR 75bpm  ASSESSMENT AND PLAN: History of Afib RVR now in sinus rhythm. Pt is feeling well today and tolerating soft diet. Continue oral amiodarone. Continue heparin for now and advise transition to Eliquis. Will continue to follow.   Principal Problem:   Ileus [facility]) Active Problems:   Diabetes ([place])   Atrial flutter ([place])   CAD (coronary artery disease)   HTN (hypertension)   Atrial fibrillation ([place])    Jake Bathe, NP-C 08/28/2017 8:42 AM

## 2017-08-29 LAB — GLUCOSE, CAPILLARY
GLUCOSE-CAPILLARY: 198 mg/dL — AB (ref 65–99)
GLUCOSE-CAPILLARY: 265 mg/dL — AB (ref 65–99)
GLUCOSE-CAPILLARY: 282 mg/dL — AB (ref 65–99)
Glucose-Capillary: 179 mg/dL — ABNORMAL HIGH (ref 65–99)
Glucose-Capillary: 247 mg/dL — ABNORMAL HIGH (ref 65–99)

## 2017-08-29 LAB — BASIC METABOLIC PANEL
ANION GAP: 11 (ref 5–15)
Anion gap: 11 (ref 5–15)
BUN: 19 mg/dL (ref 6–20)
BUN: 17 mg/dL (ref 6–20)
CO2: 30 mmol/L (ref 22–32)
CO2: 29 mmol/L (ref 22–32)
Calcium: 8.1 mg/dL — ABNORMAL LOW (ref 8.9–10.3)
Calcium: 8.3 mg/dL — ABNORMAL LOW (ref 8.9–10.3)
Chloride: 95 mmol/L — ABNORMAL LOW (ref 101–111)
Chloride: 94 mmol/L — ABNORMAL LOW (ref 101–111)
Creatinine, Ser: 2.27 mg/dL — ABNORMAL HIGH (ref 0.61–1.24)
Creatinine, Ser: 2.21 mg/dL — ABNORMAL HIGH (ref 0.61–1.24)
GFR calc Af Amer: 34 mL/min — ABNORMAL LOW (ref >60)
GFR calc Af Amer: 35 mL/min — ABNORMAL LOW (ref >60)
GFR, EST NON AFRICAN AMERICAN: 29 mL/min — AB (ref >60)
GFR, EST NON AFRICAN AMERICAN: 30 mL/min — AB (ref >60)
GLUCOSE: 190 mg/dL — AB (ref 65–99)
Glucose, Bld: 274 mg/dL — ABNORMAL HIGH (ref 65–99)
POTASSIUM: 2.9 mmol/L — AB (ref 3.5–5.1)
POTASSIUM: 2.9 mmol/L — AB (ref 3.5–5.1)
SODIUM: 134 mmol/L — AB (ref 135–145)
Sodium: 136 mmol/L (ref 135–145)

## 2017-08-29 LAB — CBC
HEMATOCRIT: 31.3 % — AB (ref 40.0–52.0)
Hemoglobin: 10.3 g/dL — ABNORMAL LOW (ref 13.0–18.0)
MCH: 28.5 pg (ref 26.0–34.0)
MCHC: 32.8 g/dL (ref 32.0–36.0)
MCV: 87 fL (ref 80.0–100.0)
Platelets: 252 10*3/uL (ref 150–440)
RBC: 3.6 MIL/uL — ABNORMAL LOW (ref 4.40–5.90)
RDW: 14.2 % (ref 11.5–14.5)
WBC: 11.4 10*3/uL — ABNORMAL HIGH (ref 3.8–10.6)

## 2017-08-29 LAB — PHOSPHORUS: PHOSPHORUS: 2.4 mg/dL — AB (ref 2.5–4.6)

## 2017-08-29 LAB — MAGNESIUM: Magnesium: 2 mg/dL (ref 1.7–2.4)

## 2017-08-29 MED ORDER — POTASSIUM CHLORIDE 10 MEQ/100ML IV SOLN
10.0000 meq | INTRAVENOUS | Status: DC
  Administered 2017-08-29: 10 meq via INTRAVENOUS
  Filled 2017-08-29 (×4): qty 100

## 2017-08-29 MED ORDER — POTASSIUM CHLORIDE 20 MEQ/15ML (10%) PO SOLN: 40.0000 meq | Freq: Once | ORAL | Status: DC

## 2017-08-29 MED ORDER — AMIODARONE HCL 400 MG PO TABS: 400.0000 mg | ORAL_TABLET | Freq: Two times a day (BID) | ORAL | 0 refills | Status: DC

## 2017-08-29 MED ORDER — POTASSIUM CHLORIDE 10 MEQ/100ML IV SOLN
10.0000 meq | INTRAVENOUS | Status: AC
  Administered 2017-08-29 (×4): 10 meq via INTRAVENOUS
  Filled 2017-08-29 (×4): qty 100

## 2017-08-29 MED ORDER — POTASSIUM CHLORIDE 20 MEQ/15ML (10%) PO SOLN: 80.0000 meq | Freq: Once | ORAL | Status: DC

## 2017-08-29 MED ORDER — POTASSIUM CHLORIDE 20 MEQ/15ML (10%) PO SOLN
60.0000 meq | Freq: Once | ORAL | Status: AC
Start: 2017-08-29 — End: 2017-08-29
  Administered 2017-08-29: 60 meq via ORAL
  Filled 2017-08-29: qty 45

## 2017-08-29 MED ORDER — OXYCODONE-ACETAMINOPHEN 5-325 MG PO TABS
1.0000 | ORAL_TABLET | Freq: Four times a day (QID) | ORAL | Status: DC | PRN
  Administered 2017-08-29 (×2): 1 via ORAL
  Filled 2017-08-29 (×2): qty 1

## 2017-08-29 MED ORDER — APIXABAN 5 MG PO TABS: 5.0000 mg | ORAL_TABLET | Freq: Two times a day (BID) | ORAL | 0 refills | Status: DC

## 2017-08-29 MED ORDER — BASAGLAR KWIKPEN 100 UNIT/ML ~~LOC~~ SOPN: 20.0000 [IU] | PEN_INJECTOR | Freq: Every day | SUBCUTANEOUS | 0 refills | Status: DC

## 2017-08-29 MED ORDER — METOPROLOL TARTRATE 25 MG PO TABS
12.5000 mg | ORAL_TABLET | Freq: Two times a day (BID) | ORAL | Status: DC
  Administered 2017-08-29: 12.5 mg via ORAL
  Filled 2017-08-29: qty 1

## 2017-08-29 MED ORDER — POTASSIUM CHLORIDE CRYS ER 20 MEQ PO TBCR
20.0000 meq | EXTENDED_RELEASE_TABLET | Freq: Three times a day (TID) | ORAL | Status: DC
Start: 2017-08-30 — End: 2017-08-29

## 2017-08-29 MED ORDER — PANTOPRAZOLE SODIUM 40 MG PO TBEC
40.0000 mg | DELAYED_RELEASE_TABLET | Freq: Every day | ORAL | Status: DC
  Administered 2017-08-29: 40 mg via ORAL
  Filled 2017-08-29: qty 1

## 2017-08-29 MED ORDER — INSULIN ASPART 100 UNIT/ML ~~LOC~~ SOLN: SUBCUTANEOUS | 0 refills | Status: DC

## 2017-08-29 MED ORDER — ALUM & MAG HYDROXIDE-SIMETH 200-200-20 MG/5ML PO SUSP
30.0000 mL | Freq: Four times a day (QID) | ORAL | Status: DC | PRN
  Administered 2017-08-29: 30 mL via ORAL
  Filled 2017-08-29: qty 30

## 2017-08-29 MED ORDER — POTASSIUM CHLORIDE CRYS ER 20 MEQ PO TBCR
40.0000 meq | EXTENDED_RELEASE_TABLET | ORAL | Status: DC
  Filled 2017-08-29: qty 2

## 2017-08-29 NOTE — Discharge Instructions (Signed)

## 2017-08-29 NOTE — Discharge Summary (Signed)
[facility] [place] NAME: Richard [name]    MR#:  [id number]  DATE OF BIRTH:  July 31, 1956  DATE OF ADMISSION:  08/23/2017   ADMITTING PHYSICIAN: Lance Coon, MD  DATE OF DISCHARGE: No discharge date for patient encounter.  PRIMARY CARE PHYSICIAN: [facility], [facility]   ADMISSION DIAGNOSIS:  Hyperglycemia [R73.9] Abdominal pain, diffuse [R10.84] DISCHARGE DIAGNOSIS:  Principal Problem:   Ileus ([place]) Active Problems:   Diabetes ([place])   Atrial flutter (HCC)   CAD (coronary artery disease)   HTN (hypertension)   Atrial fibrillation ([place])  SECONDARY DIAGNOSIS:   Past Medical History:  Diagnosis Date  . CHF (congestive heart failure) ([place])   . Chronic back pain   . Chronic leg pain   . Coronary artery disease   . Diabetes mellitus without complication ([place])   . Hypertension   . Neuropathy    HOSPITAL COURSE:   61 year old male with past medical history of pancreatic cancer status post recent pancreatectomy with splenectomy done at [facility], essential hypertension, diabetes, history of coronary artery disease, history of congestive heart failure who presents to the hospital due to abdominal pain and noted to have an ileus based on a CAT scan and also with the leukocytosis and tachycardia with meeting sepsis criteria.  1. Sepsis-patient met criteria given his leukocytosis, tachycardia. No clear source of the sepsis has been identified. Patient's urinalysis is been negative, chest x-ray shows no acute pathology. Patient had repeat CT scan of the abdomen and pelvis with contrast done  showing no evidence of acute infectious process. -Clinically patient is afebrile and hemodynamically stable discontinued Zosyn. WBC count trending down.  2. Ileus-this is the cause of patient's abdominal pain distention and nausea. Repeat CT scan of the abdomen and pelvis done on 08/26/17 showing some mild colitis which is likely related to  his recent surgery with pancreatectomy and splenectomy. No evidence of ileus or bowel obstruction noted -Seen by surgery and NG tube removed. Clinically feels better. No abdominal pain, nausea vomiting. NG tube has been removed. Started on a clear liquid diet and advanced to soft diet. The patient tolerated soft diet and had bowel movement.  3. Atrial flutter with RVR-this is secondary to his ileus and underlying illness. switched from IV amiodarone drip to oral amiodarone load. on IV metoprolol prn for rate control. Changed heparin to Eliquis per cardiologist.  4. Diabetes type 2 without complication-sugars improved. on sliding scale insulin. Resume home insulin after discharge.  5. Leukocytosis-source unclear. Improved.  6. Hypokalemia - due to increasing drainage from the NG tube.  Improving with potassium supplement. The patient said he cannot swallow potassium tablets, he is given IV potassium.  Follow-up level.  7. Acute kidney injury-secondary to the volume loss from increasing NG tube drainage and IV contrast. Improving with IV normal saline with potassium, and follow BUN/creatinine as outpatient.  Discussed with Dr. Juleen China. Generalized weakness.  PT evaluation suggested home health and PT. DISCHARGE CONDITIONS:  Medically stable, the patient will be discharged to home with home health and PT today. CONSULTS OBTAINED:  Treatment Team:  Dionisio David, MD Clayburn Pert, MD Lavonia Dana, MD DRUG ALLERGIES:  No Known Allergies DISCHARGE MEDICATIONS:   Allergies as of 08/29/2017   No Known Allergies     Medication List    STOP taking these medications   cyclobenzaprine 10 MG tablet Commonly known as:  FLEXERIL   enoxaparin 40 MG/0.4ML injection Commonly known as:  LOVENOX   HYDROcodone-acetaminophen 5-325 MG tablet Commonly known as:  NORCO   HYDROmorphone 2 MG tablet Commonly known as:  DILAUDID   insulin glargine 100 UNIT/ML injection Commonly  known as:  LANTUS Replaced by:  BASAGLAR KWIKPEN 100 UNIT/ML Sopn   insulin lispro 100 UNIT/ML injection Commonly known as:  HUMALOG   insulin regular 100 units/mL injection Commonly known as:  NOVOLIN R,HUMULIN R   metFORMIN 500 MG tablet Commonly known as:  GLUCOPHAGE   pravastatin 80 MG tablet Commonly known as:  PRAVACHOL     TAKE these medications   amiodarone 400 MG tablet Commonly known as:  PACERONE Take 1 tablet (400 mg total) 2 (two) times daily by mouth.   apixaban 5 MG Tabs tablet Commonly known as:  ELIQUIS Take 1 tablet (5 mg total) 2 (two) times daily by mouth.   aspirin EC 81 MG tablet Take 1 tablet (81 mg total) by mouth daily.   atorvastatin 40 MG tablet Commonly known as:  LIPITOR Take 40 mg by mouth every evening.   BASAGLAR KWIKPEN 100 UNIT/ML Sopn Inject 0.2 mLs (20 Units total) at bedtime into the skin. Replaces:  insulin glargine 100 UNIT/ML injection   gabapentin 800 MG tablet Commonly known as:  NEURONTIN Take 800 mg by mouth 3 (three) times daily. What changed:  Another medication with the same name was removed. Continue taking this medication, and follow the directions you see here.   GLOBAL EASE INJECT PEN NEEDLES 31G X 5 MM Misc Generic drug:  Insulin Pen Needle   insulin aspart 100 UNIT/ML injection Commonly known as:  NOVOLOG Inject 3 units prior to each meal   metoCLOPramide 10 MG tablet Commonly known as:  REGLAN Take 10 mg by mouth 3 (three) times daily as needed for nausea.   metoprolol tartrate 25 MG tablet Commonly known as:  LOPRESSOR Take 0.5 tablets (12.5 mg total) by mouth 2 (two) times daily. What changed:  how much to take   Needles & Syringes Misc Use as directed   ondansetron 4 MG tablet Commonly known as:  ZOFRAN Take 4 mg by mouth every 8 (eight) hours as needed for nausea or vomiting.   pantoprazole 40 MG tablet Commonly known as:  PROTONIX Take 40 mg by mouth 2 (two) times daily.        DISCHARGE  INSTRUCTIONS:  See AVS. If you experience worsening of your admission symptoms, develop shortness of breath, life threatening emergency, suicidal or homicidal thoughts you must seek medical attention immediately by calling 911 or calling your MD immediately  if symptoms less severe.  You Must read complete instructions/literature along with all the possible adverse reactions/side effects for all the Medicines you take and that have been prescribed to you. Take any new Medicines after you have completely understood and accpet all the possible adverse reactions/side effects.   Please note  You were cared for by a hospitalist during your hospital stay. If you have any questions about your discharge medications or the care you received while you were in the hospital after you are discharged, you can call the unit and asked to speak with the hospitalist on call if the hospitalist that took care of you is not available. Once you are discharged, your primary care physician will handle any further medical issues. Please note that NO REFILLS for any discharge medications will be authorized once you are discharged, as it is imperative that you return to your primary care physician (or establish a relationship  with a primary care physician if you do not have one) for your aftercare needs so that they can reassess your need for medications and monitor your lab values.    On the day of Discharge:  VITAL SIGNS:  Blood pressure (!) 166/88, pulse 82, temperature 98.6 F (37 C), resp. rate 17, height _0  (1.803 m), weight 136 lb 4.8 oz (61.8 kg), SpO2 100 %. PHYSICAL EXAMINATION:  GENERAL:  61 y.o.-year-old patient lying in the bed with no acute distress.  EYES: Pupils equal, round, reactive to light and accommodation. No scleral icterus. Extraocular muscles intact.  HEENT: Head atraumatic, normocephalic. Oropharynx and nasopharynx clear.  NECK:  Supple, no jugular venous distention. No thyroid enlargement, no  tenderness.  LUNGS: Normal breath sounds bilaterally, no wheezing, rales,rhonchi or crepitation. No use of accessory muscles of respiration.  CARDIOVASCULAR: S1, S2 normal. No murmurs, rubs, or gallops.  ABDOMEN: Soft, non-tender, non-distended. Bowel sounds present. No organomegaly or mass.  EXTREMITIES: No pedal edema, cyanosis, or clubbing.  NEUROLOGIC: Cranial nerves II through XII are intact. Muscle strength 4/5 in all extremities. Sensation intact. Gait not checked.  PSYCHIATRIC: The patient is alert and oriented x 3.  SKIN: No obvious rash, lesion, or ulcer.  DATA REVIEW:   CBC Recent Labs  Lab 08/29/17 0412  WBC 11.4*  HGB 10.3*  HCT 31.3*  PLT 252    Chemistries  Recent Labs  Lab 08/27/17 0502  08/29/17 0412  NA 147*   < > 136  K 2.4*   < > 2.9*  CL 89*   < > 95*  CO2 37*   < > 30  GLUCOSE 151*   < > 190*  BUN 46*   < > 19  CREATININE 3.91*   < > 2.27*  CALCIUM 9.2   < > 8.1*  MG  --    < > 2.0  AST 27  --   --   ALT 12*  --   --   ALKPHOS 25*  --   --   BILITOT 1.2  --   --    < > = values in this interval not displayed.     Microbiology Results  Results for orders placed or performed during the hospital encounter of 08/23/17  Culture, blood (routine x 2)     Status: None   Collection Time: 08/23/17  6:30 PM  Result Value Ref Range Status   Specimen Description BLOOD RIGHT FOREARM  Final   Special Requests   Final    BOTTLES DRAWN AEROBIC AND ANAEROBIC Blood Culture results may not be optimal due to an inadequate volume of blood received in culture bottles   Culture NO GROWTH 5 DAYS  Final   Report Status 08/28/2017 FINAL  Final  Culture, blood (routine x 2)     Status: None   Collection Time: 08/23/17  6:39 PM  Result Value Ref Range Status   Specimen Description BLOOD LEFT FOREARM  Final   Special Requests   Final    BOTTLES DRAWN AEROBIC AND ANAEROBIC Blood Culture adequate volume   Culture NO GROWTH 5 DAYS  Final   Report Status 08/28/2017  FINAL  Final  MRSA PCR Screening     Status: None   Collection Time: 08/24/17  9:43 AM  Result Value Ref Range Status   MRSA by PCR NEGATIVE NEGATIVE Final    Comment:        The GeneXpert MRSA Assay (FDA approved for NASAL specimens only), is  one component of a comprehensive MRSA colonization surveillance program. It is not intended to diagnose MRSA infection nor to guide or monitor treatment for MRSA infections.     RADIOLOGY:  No results found.   Management plans discussed with the patient, family and they are in agreement.  CODE STATUS: Full Code   TOTAL TIME TAKING CARE OF THIS PATIENT: 45 minutes.    [name] M.D on 08/29/2017 at 2:31 PM  Between 7am to 6pm - Pager - [phone]  After 6pm go to [email or web address] - Technical brewer [facility]  Office  [phone]  CC: Primary care physician; Center, [place]   Note: This dictation was prepared with Diplomatic Services operational officer dictation along with smaller phrase technology. Any transcriptional errors that result from this process are unintentional.

## 2017-08-29 NOTE — Progress Notes (Signed)
[facility]  ROUNDING NOTE   Subjective:   Patient states he is eating better. Continues to have nausea but no more vomiting.   Creatinine 2.27 (2.94) (3.91)  K 2.9 (2.6)  Objective:  Vital signs in last 24 hours:  Temp:  [98 F (36.7 C)-99 F (37.2 C)] 98.6 F (37 C) (11/08 0355) Pulse Rate:  [76-83] 82 (11/08 0355) Resp:  [17-18] 17 (11/08 0355) BP: (155-178)/(68-88) 166/88 (11/08 0355) SpO2:  [99 %-100 %] 100 % (11/08 0355)  Weight change:  Filed Weights   08/24/17 0242 08/24/17 1141 08/25/17 1513  Weight: 64 kg (141 lb 1 oz) 62.8 kg (138 lb 7.2 oz) 61.8 kg (136 lb 4.8 oz)    Intake/Output: I/O last 3 completed shifts: In: 8299 [P.O.:840; B.Z.:1696] Out: 3125 [VELFY:1017]   Intake/Output this shift:  Total I/O In: 360 [P.O.:360] Out: 250 [Urine:250]  Physical Exam: General: NAD,   Head: Normocephalic, atraumatic. Moist oral mucosal membranes  Eyes: Anicteric, PERRL  Neck: Supple, trachea midline  Lungs:  Clear to auscultation  Heart: Regular rate and rhythm  Abdomen:  Soft, nontender,   Extremities: No  peripheral edema.  Neurologic: Nonfocal, moving all four extremities  Skin: No lesions       Basic Metabolic Panel: Recent Labs  Lab 08/24/17 0557 08/25/17 0511 08/26/17 1419 08/27/17 0502 08/28/17 0451 08/29/17 0412  NA 138 144  --  147* 139 136  K 4.2 2.8* 2.4* 2.4* 2.6* 2.9*  CL 103 99*  --  89* 90* 95*  CO2 17* 33*  --  37* 37* 30  GLUCOSE 324* 201*  --  151* 232* 190*  BUN 27* 25*  --  46* 31* 19  CREATININE 1.05 0.78  --  3.91* 2.94* 2.27*  CALCIUM 9.0 9.5  --  9.2 8.1* 8.1*  MG  --   --  2.1  --  1.7 2.0  PHOS  --   --   --   --  2.6 2.4*    Liver Function Tests: Recent Labs  Lab 08/24/17 0557 08/25/17 0511 08/27/17 0502  AST 17 16 27   ALT 18 15* 12*  ALKPHOS 25* 26* 25*  BILITOT 1.8* 1.1 1.2  PROT 7.2 7.6 7.4  ALBUMIN 3.7 3.7 3.4*   Recent Labs  Lab 08/24/17 0557  LIPASE 15  AMYLASE 189*   No results  for input(s): AMMONIA in the last 168 hours.  CBC: Recent Labs  Lab 08/23/17 1838 08/24/17 0557 08/25/17 0511 08/27/17 0502 08/29/17 0412  WBC 25.1* 21.7* 20.9* 15.4* 11.4*  NEUTROABS  --   --   --  12.4*  --   HGB 13.7 12.4* 13.1 12.6* 10.3*  HCT 42.9 39.1* 40.3 39.5* 31.3*  MCV 89.4 89.8 88.5 88.1 87.0  PLT 471* 391 363 340 252    Cardiac Enzymes: Recent Labs  Lab 08/23/17 2325  TROPONINI 0.03*    BNP: Invalid input(s): POCBNP  CBG: Recent Labs  Lab 08/28/17 1844 08/28/17 2107 08/29/17 0132 08/29/17 0501 08/29/17 0758  GLUCAP 274* 233* 179* 198* 27*    Microbiology: Results for orders placed or performed during the hospital encounter of 08/23/17  Culture, blood (routine x 2)     Status: None   Collection Time: 08/23/17  6:30 PM  Result Value Ref Range Status   Specimen Description BLOOD RIGHT FOREARM  Final   Special Requests   Final    BOTTLES DRAWN AEROBIC AND ANAEROBIC Blood Culture results may not be optimal due to  an inadequate volume of blood received in culture bottles   Culture NO GROWTH 5 DAYS  Final   Report Status 08/28/2017 FINAL  Final  Culture, blood (routine x 2)     Status: None   Collection Time: 08/23/17  6:39 PM  Result Value Ref Range Status   Specimen Description BLOOD LEFT FOREARM  Final   Special Requests   Final    BOTTLES DRAWN AEROBIC AND ANAEROBIC Blood Culture adequate volume   Culture NO GROWTH 5 DAYS  Final   Report Status 08/28/2017 FINAL  Final  MRSA PCR Screening     Status: None   Collection Time: 08/24/17  9:43 AM  Result Value Ref Range Status   MRSA by PCR NEGATIVE NEGATIVE Final    Comment:        The GeneXpert MRSA Assay (FDA approved for NASAL specimens only), is one component of a comprehensive MRSA colonization surveillance program. It is not intended to diagnose MRSA infection nor to guide or monitor treatment for MRSA infections.     Coagulation Studies: No results for input(s): LABPROT, INR in  the last 72 hours.  Urinalysis: No results for input(s): COLORURINE, LABSPEC, PHURINE, GLUCOSEU, HGBUR, BILIRUBINUR, KETONESUR, PROTEINUR, UROBILINOGEN, NITRITE, LEUKOCYTESUR in the last 72 hours.  Invalid input(s): APPERANCEUR    Imaging: No results found.   Medications:   . 0.9 % NaCl with KCl 20 mEq / L 75 mL/hr at 08/29/17 0450  . potassium chloride 10 mEq (08/29/17 0904)   . amiodarone  400 mg Oral BID  . apixaban  5 mg Oral BID  . feeding supplement (ENSURE ENLIVE)  237 mL Oral BID BM  . insulin aspart  0-15 Units Subcutaneous TID WC  . insulin aspart  0-5 Units Subcutaneous QHS  . insulin glargine  15 Units Subcutaneous Daily  . metoCLOPramide (REGLAN) injection  5 mg Intravenous Q6H  . pantoprazole  40 mg Oral QAC breakfast   acetaminophen **OR** acetaminophen, alum & mag hydroxide-simeth, metoprolol tartrate, morphine injection, ondansetron **OR** ondansetron (ZOFRAN) IV, oxyCODONE-acetaminophen  Assessment/ Plan:  Mr. Richard [name] is a 61 y.o. black male with pancreatectomy and splenectomy on 10/10 at [facility] for pancreatic cancer, complicated by post op ileus, diabetes mellitus type II, hypertension, coronary artery disease, and congestive heart failure , who was [facility]   1. Acute Renal Failure with hypokalemia, hypernatremia and metabolic alkalosis: no history of chronic kidney disease.  IV contrast exposure on 11/5.  Also with prerenal azotemia  Electrolytes are consistent with contraction alkalosis and GI losses Sodium improved. Continues to have alkalosis and hypokalemia.  - Agree with supportive care - Continue IV fluids: NS with 20KCl at 22mL/hr - replace Potassium - Encourage PO intake.   2. Hypertension: blood pressure with elevations.  - amiodarone.  - restart home metoprolol 12.5mg  bid.   3. Diabetes mellitus type II with renal manifestations: glucosuria on urinalysis     LOS: [place], [place] 11/8/201810:49 AM

## 2017-08-29 NOTE — Progress Notes (Signed)
SUBJECTIVE: Feeling better   Vitals:   08/28/17 0759 08/28/17 1216 08/28/17 2007 08/29/17 0355  BP: 136/71 (!) 178/68 (!) 155/78 (!) 166/88  Pulse: 80 76 83 82  Resp: 18  18 17   Temp: 99.1 F (37.3 C) 99 F (37.2 C) 98 F (36.7 C) 98.6 F (37 C)  TempSrc: Oral Oral    SpO2: 98% 99% 100% 100%  Weight:      Height:        Intake/Output Summary (Last 24 hours) at 08/29/2017 0815 Last data filed at 08/29/2017 0602 Gross per 24 hour  Intake 2601.67 ml  Output 3125 ml  Net -523.33 ml    LABS: Basic Metabolic Panel: Recent Labs    08/28/17 0451 08/29/17 0412  NA 139 136  K 2.6* 2.9*  CL 90* 95*  CO2 37* 30  GLUCOSE 232* 190*  BUN 31* 19  CREATININE 2.94* 2.27*  CALCIUM 8.1* 8.1*  MG 1.7 2.0  PHOS 2.6 2.4*   Liver Function Tests: Recent Labs    08/27/17 0502  AST 27  ALT 12*  ALKPHOS 25*  BILITOT 1.2  PROT 7.4  ALBUMIN 3.4*   No results for input(s): LIPASE, AMYLASE in the last 72 hours. CBC: Recent Labs    08/27/17 0502 08/29/17 0412  WBC 15.4* 11.4*  NEUTROABS 12.4*  --   HGB 12.6* 10.3*  HCT 39.5* 31.3*  MCV 88.1 87.0  PLT 340 252   Cardiac Enzymes: No results for input(s): CKTOTAL, CKMB, CKMBINDEX, TROPONINI in the last 72 hours. BNP: Invalid input(s): POCBNP D-Dimer: No results for input(s): DDIMER in the last 72 hours. Hemoglobin A1C: No results for input(s): HGBA1C in the last 72 hours. Fasting Lipid Panel: No results for input(s): CHOL, HDL, LDLCALC, TRIG, CHOLHDL, LDLDIRECT in the last 72 hours. Thyroid Function Tests: No results for input(s): TSH, T4TOTAL, T3FREE, THYROIDAB in the last 72 hours.  Invalid input(s): FREET3 Anemia Panel: No results for input(s): VITAMINB12, FOLATE, FERRITIN, TIBC, IRON, RETICCTPCT in the last 72 hours.   PHYSICAL EXAM General: Well developed, well nourished, in no acute distress HEENT:  Normocephalic and atramatic Neck:  No JVD.  Lungs: Clear bilaterally to auscultation and percussion. Heart:  HRRR . Normal S1 and S2 without gallops or murmurs.  Abdomen: Bowel sounds are positive, abdomen soft and non-tender  Msk:  Back normal, normal gait. Normal strength and tone for age. Extremities: No clubbing, cyanosis or edema.   Neuro: Alert and oriented X 3. 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 Continue current meds.  Principal Problem:   Ileus ([place]) Active Problems:   Diabetes ([place])   Atrial flutter (HCC)   CAD (coronary artery disease)   HTN (hypertension)   Atrial fibrillation (HCC)    Nadezhda Pollitt A, MD, [facility] 08/29/2017 8:15 AM

## 2017-08-29 NOTE — Progress Notes (Signed)
Patient states his insurance company doesn't cover his insulin. Pt has [facility] choice.  Cover lantus biosimilar basaglar not lantus Covers novolog instead of humalog Requested Dr Bridgett Larsson enters these for discharge Orders entered  Richard [name], Richard [name], BCPS Clinical Pharmacist

## 2017-08-29 NOTE — Consult Note (Signed)
MEDICATION RELATED CONSULT NOTE - INITIAL   Pharmacy Consult for electrolytes Indication: hypokalemia  No Known Allergies  Patient Measurements: Height: 5\' 11"  (180.3 cm) Weight: 136 lb 4.8 oz (61.8 kg) IBW/kg (Calculated) : 75.3 Adjusted Body Weight:   Vital Signs: Temp: 98.6 F (37 C) (11/08 0355) BP: 166/88 (11/08 0355) Pulse Rate: 82 (11/08 0355) Intake/Output from previous day: 11/07 0701 - 11/08 0700 In: 5335 [P.O.:840; N.A.:3557] Out: 3125 [DUKGU:5427] Intake/Output from this shift: No intake/output data recorded.  Labs: Recent Labs    08/26/17 1419 08/27/17 0502 08/28/17 0451 08/29/17 0412  WBC  --  15.4*  --  11.4*  HGB  --  12.6*  --  10.3*  HCT  --  39.5*  --  31.3*  PLT  --  340  --  252  CREATININE  --  3.91* 2.94* 2.27*  MG 2.1  --  1.7 2.0  PHOS  --   --  2.6 2.4*  ALBUMIN  --  3.4*  --   --   PROT  --  7.4  --   --   AST  --  27  --   --   ALT  --  12*  --   --   ALKPHOS  --  25*  --   --   BILITOT  --  1.2  --   --    Estimated Creatinine Clearance: 29.5 mL/min (A) (by C-G formula based on SCr of 2.27 mg/dL (H)).   Microbiology: Recent Results (from the past 720 hour(s))  Culture, blood (routine x 2)     Status: None   Collection Time: 08/23/17  6:30 PM  Result Value Ref Range Status   Specimen Description BLOOD RIGHT FOREARM  Final   Special Requests   Final    BOTTLES DRAWN AEROBIC AND ANAEROBIC Blood Culture results may not be optimal due to an inadequate volume of blood received in culture bottles   Culture NO GROWTH 5 DAYS  Final   Report Status 08/28/2017 FINAL  Final  Culture, blood (routine x 2)     Status: None   Collection Time: 08/23/17  6:39 PM  Result Value Ref Range Status   Specimen Description BLOOD LEFT FOREARM  Final   Special Requests   Final    BOTTLES DRAWN AEROBIC AND ANAEROBIC Blood Culture adequate volume   Culture NO GROWTH 5 DAYS  Final   Report Status 08/28/2017 FINAL  Final  MRSA PCR Screening     Status:  None   Collection Time: 08/24/17  9:43 AM  Result Value Ref Range Status   MRSA by PCR NEGATIVE NEGATIVE Final    Comment:        The GeneXpert MRSA Assay (FDA approved for NASAL specimens only), is one component of a comprehensive MRSA colonization surveillance program. It is not intended to diagnose MRSA infection nor to guide or monitor treatment for MRSA infections.     Medical History: Past Medical History:  Diagnosis Date  . CHF (congestive heart failure) ([place])   . Chronic back pain   . Chronic leg pain   . Coronary artery disease   . Diabetes mellitus without complication ([place])   . Hypertension   . Neuropathy     Medications:  Scheduled:  . amiodarone  400 mg Oral BID  . apixaban  5 mg Oral BID  . feeding supplement (ENSURE ENLIVE)  237 mL Oral BID BM  . insulin aspart  0-15 Units Subcutaneous TID WC  .  insulin aspart  0-5 Units Subcutaneous QHS  . insulin glargine  15 Units Subcutaneous Daily  . metoCLOPramide (REGLAN) injection  5 mg Intravenous Q6H  . pantoprazole  40 mg Oral QAC breakfast    Assessment: Patient is a 61 year old make who is admitted with AKI and hypokalemia. Pt currently on NS w/ 20MEQ YOM@ 62ml/hr (1.5MEQ/hr). Yesterday pt received 100 MEQ po of KCL and 36 MEQ of KCL IV- K only improved from 2.6 to 2.9. Mg IV was given. Mg this AM is 2.0. Phos slightly low at 2.4  Goal of Therapy:  K=3.5-5  Plan:  Patient states they cannot take po K. He cannot swallow tabs and says the powder packets makes his stomach upset. Pt is tolerating IV fine per RN. I will give KCL 10 MEQ IV x 5. Continue maintenance fluids as well. Recheck K at 1630 after piggyback runs.   Ramond Dial, Pharm.D, BCPS Clinical Pharmacist  08/29/2017,8:40 AM

## 2017-08-29 NOTE — Care Management (Addendum)
CM also addressed concerns over insulin coverage. Patient's current insulins are on the [place] [employer] preferred list but per [place] [employer], found that patient had medicare coverage for his meds and does receive additional assistance with copays. [facility] assisted attending in ordering insulins that are on his specific formularies.  Notified [facility] of discharge and obtained order for SN, PT and OT.  Provided patient with Eliquis 30 day trial coupon. Family to transport

## 2017-08-29 NOTE — Progress Notes (Signed)
Pt's potassium level is  2.9 after IV replacement x5 . MD notified. Orders to give liquid potasium and proceed with discharge. I will continue to assess.

## 2017-08-29 NOTE — Care Management Important Message (Signed)
Important Message  Patient Details  Name: Richard [name] MRN: [id number] Date of Birth: 07-18-1956   Medicare Important Message Given:  Yes Signed IM notice given    Katrina Stack, RN 08/29/2017, 1:12 PM

## 2017-09-05 ENCOUNTER — Emergency Department: Payer: Medicare Other

## 2017-09-05 ENCOUNTER — Inpatient Hospital Stay
Admission: EM | Admit: 2017-09-05 | Discharge: 2017-09-11 | DRG: 637 | Disposition: A | Discharge status: Home / Self Care | Payer: Medicare Other | Attending: Internal Medicine | Admitting: Internal Medicine

## 2017-09-05 ENCOUNTER — Encounter: Payer: Self-pay | Admitting: Emergency Medicine

## 2017-09-05 ENCOUNTER — Other Ambulatory Visit: Payer: Self-pay

## 2017-09-05 DIAGNOSIS — N179 Acute kidney failure, unspecified: Secondary | ICD-10-CM | POA: Diagnosis present

## 2017-09-05 DIAGNOSIS — E11621 Type 2 diabetes mellitus with foot ulcer: Secondary | ICD-10-CM | POA: Diagnosis present

## 2017-09-05 DIAGNOSIS — Z89431 Acquired absence of right foot: Secondary | ICD-10-CM

## 2017-09-05 DIAGNOSIS — Z794 Long term (current) use of insulin: Secondary | ICD-10-CM

## 2017-09-05 DIAGNOSIS — Z681 Body mass index (BMI) 19 or less, adult: Secondary | ICD-10-CM

## 2017-09-05 DIAGNOSIS — N183 Chronic kidney disease, stage 3 (moderate): Secondary | ICD-10-CM | POA: Diagnosis present

## 2017-09-05 DIAGNOSIS — E876 Hypokalemia: Secondary | ICD-10-CM | POA: Diagnosis present

## 2017-09-05 DIAGNOSIS — E1140 Type 2 diabetes mellitus with diabetic neuropathy, unspecified: Secondary | ICD-10-CM | POA: Diagnosis present

## 2017-09-05 DIAGNOSIS — G8929 Other chronic pain: Secondary | ICD-10-CM | POA: Diagnosis present

## 2017-09-05 DIAGNOSIS — A419 Sepsis, unspecified organism: Secondary | ICD-10-CM | POA: Diagnosis present

## 2017-09-05 DIAGNOSIS — Z7901 Long term (current) use of anticoagulants: Secondary | ICD-10-CM

## 2017-09-05 DIAGNOSIS — E43 Unspecified severe protein-calorie malnutrition: Secondary | ICD-10-CM | POA: Diagnosis present

## 2017-09-05 DIAGNOSIS — K921 Melena: Secondary | ICD-10-CM | POA: Diagnosis present

## 2017-09-05 DIAGNOSIS — E0810 Diabetes mellitus due to underlying condition with ketoacidosis without coma: Secondary | ICD-10-CM

## 2017-09-05 DIAGNOSIS — R935 Abnormal findings on diagnostic imaging of other abdominal regions, including retroperitoneum: Secondary | ICD-10-CM | POA: Diagnosis not present

## 2017-09-05 DIAGNOSIS — D62 Acute posthemorrhagic anemia: Secondary | ICD-10-CM | POA: Diagnosis present

## 2017-09-05 DIAGNOSIS — I5032 Chronic diastolic (congestive) heart failure: Secondary | ICD-10-CM | POA: Diagnosis present

## 2017-09-05 DIAGNOSIS — Z833 Family history of diabetes mellitus: Secondary | ICD-10-CM

## 2017-09-05 DIAGNOSIS — K209 Esophagitis, unspecified: Secondary | ICD-10-CM | POA: Diagnosis present

## 2017-09-05 DIAGNOSIS — M549 Dorsalgia, unspecified: Secondary | ICD-10-CM | POA: Diagnosis present

## 2017-09-05 DIAGNOSIS — D638 Anemia in other chronic diseases classified elsewhere: Secondary | ICD-10-CM | POA: Diagnosis present

## 2017-09-05 DIAGNOSIS — K567 Ileus, unspecified: Secondary | ICD-10-CM | POA: Diagnosis present

## 2017-09-05 DIAGNOSIS — C252 Malignant neoplasm of tail of pancreas: Secondary | ICD-10-CM | POA: Diagnosis present

## 2017-09-05 DIAGNOSIS — E1110 Type 2 diabetes mellitus with ketoacidosis without coma: Secondary | ICD-10-CM | POA: Diagnosis present

## 2017-09-05 DIAGNOSIS — Z7982 Long term (current) use of aspirin: Secondary | ICD-10-CM

## 2017-09-05 DIAGNOSIS — E860 Dehydration: Secondary | ICD-10-CM | POA: Diagnosis present

## 2017-09-05 DIAGNOSIS — K228 Other specified diseases of esophagus: Secondary | ICD-10-CM | POA: Diagnosis not present

## 2017-09-05 DIAGNOSIS — K8681 Exocrine pancreatic insufficiency: Secondary | ICD-10-CM | POA: Diagnosis present

## 2017-09-05 DIAGNOSIS — E11649 Type 2 diabetes mellitus with hypoglycemia without coma: Secondary | ICD-10-CM | POA: Diagnosis present

## 2017-09-05 DIAGNOSIS — I248 Other forms of acute ischemic heart disease: Secondary | ICD-10-CM | POA: Diagnosis present

## 2017-09-05 DIAGNOSIS — Z955 Presence of coronary angioplasty implant and graft: Secondary | ICD-10-CM

## 2017-09-05 DIAGNOSIS — Z79899 Other long term (current) drug therapy: Secondary | ICD-10-CM

## 2017-09-05 DIAGNOSIS — I130 Hypertensive heart and chronic kidney disease with heart failure and stage 1 through stage 4 chronic kidney disease, or unspecified chronic kidney disease: Secondary | ICD-10-CM | POA: Diagnosis present

## 2017-09-05 DIAGNOSIS — I4891 Unspecified atrial fibrillation: Secondary | ICD-10-CM | POA: Diagnosis present

## 2017-09-05 DIAGNOSIS — F17210 Nicotine dependence, cigarettes, uncomplicated: Secondary | ICD-10-CM | POA: Diagnosis present

## 2017-09-05 DIAGNOSIS — E1122 Type 2 diabetes mellitus with diabetic chronic kidney disease: Secondary | ICD-10-CM | POA: Diagnosis present

## 2017-09-05 DIAGNOSIS — I2510 Atherosclerotic heart disease of native coronary artery without angina pectoris: Secondary | ICD-10-CM | POA: Diagnosis present

## 2017-09-05 HISTORY — DX: Unspecified atrial fibrillation: I48.91

## 2017-09-05 LAB — COMPREHENSIVE METABOLIC PANEL
ALBUMIN: 3.1 g/dL — AB (ref 3.5–5.0)
ALT: 11 U/L — AB (ref 17–63)
ANION GAP: 26 — AB (ref 5–15)
AST: 14 U/L — ABNORMAL LOW (ref 15–41)
Alkaline Phosphatase: 24 U/L — ABNORMAL LOW (ref 38–126)
BUN: 77 mg/dL — ABNORMAL HIGH (ref 6–20)
CHLORIDE: 95 mmol/L — AB (ref 101–111)
CO2: 13 mmol/L — AB (ref 22–32)
CREATININE: 2.01 mg/dL — AB (ref 0.61–1.24)
Calcium: 8.7 mg/dL — ABNORMAL LOW (ref 8.9–10.3)
GFR, EST AFRICAN AMERICAN: 39 mL/min — AB (ref >60)
GFR, EST NON AFRICAN AMERICAN: 34 mL/min — AB (ref >60)
Glucose, Bld: 582 mg/dL (ref 65–99)
POTASSIUM: 4.9 mmol/L (ref 3.5–5.1)
SODIUM: 134 mmol/L — AB (ref 135–145)
Total Bilirubin: 1.7 mg/dL — ABNORMAL HIGH (ref 0.3–1.2)
Total Protein: 6.5 g/dL (ref 6.5–8.1)

## 2017-09-05 LAB — BASIC METABOLIC PANEL
Anion gap: 25 — ABNORMAL HIGH (ref 5–15)
BUN: 72 mg/dL — AB (ref 6–20)
CALCIUM: 8.4 mg/dL — AB (ref 8.9–10.3)
CO2: 12 mmol/L — ABNORMAL LOW (ref 22–32)
CREATININE: 2.04 mg/dL — AB (ref 0.61–1.24)
Chloride: 100 mmol/L — ABNORMAL LOW (ref 101–111)
GFR calc Af Amer: 39 mL/min — ABNORMAL LOW (ref >60)
GFR calc non Af Amer: 33 mL/min — ABNORMAL LOW (ref >60)
Glucose, Bld: 546 mg/dL (ref 65–99)
POTASSIUM: 4.1 mmol/L (ref 3.5–5.1)
SODIUM: 137 mmol/L (ref 135–145)

## 2017-09-05 LAB — CBC WITH DIFFERENTIAL/PLATELET
BASOS ABS: 0 10*3/uL (ref 0–0.1)
Basophils Relative: 0 %
EOS PCT: 0 %
Eosinophils Absolute: 0 10*3/uL (ref 0–0.7)
HCT: 28.9 % — ABNORMAL LOW (ref 40.0–52.0)
HEMOGLOBIN: 8.9 g/dL — AB (ref 13.0–18.0)
LYMPHS ABS: 1 10*3/uL (ref 1.0–3.6)
LYMPHS PCT: 5 %
MCH: 27.5 pg (ref 26.0–34.0)
MCHC: 30.7 g/dL — ABNORMAL LOW (ref 32.0–36.0)
MCV: 89.4 fL (ref 80.0–100.0)
Monocytes Absolute: 1 10*3/uL (ref 0.2–1.0)
Monocytes Relative: 5 %
NEUTROS ABS: 18.5 10*3/uL — AB (ref 1.4–6.5)
NEUTROS PCT: 90 %
PLATELETS: 409 10*3/uL (ref 150–440)
RBC: 3.24 MIL/uL — AB (ref 4.40–5.90)
RDW: 15 % — ABNORMAL HIGH (ref 11.5–14.5)
WBC: 20.4 10*3/uL — AB (ref 3.8–10.6)

## 2017-09-05 LAB — URINALYSIS, COMPLETE (UACMP) WITH MICROSCOPIC
BACTERIA UA: NONE SEEN
BILIRUBIN URINE: NEGATIVE
Glucose, UA: >=500 mg/dL — AB
Hgb urine dipstick: NEGATIVE
KETONES UR: 80 mg/dL — AB
LEUKOCYTES UA: NEGATIVE
NITRITE: NEGATIVE
Protein, ur: NEGATIVE mg/dL
Specific Gravity, Urine: 1.018 (ref 1.005–1.030)
pH: 5 (ref 5.0–8.0)

## 2017-09-05 LAB — BETA-HYDROXYBUTYRIC ACID

## 2017-09-05 LAB — TROPONIN I: TROPONIN I: 0.06 ng/mL — AB (ref <0.03)

## 2017-09-05 LAB — GLUCOSE, CAPILLARY
GLUCOSE-CAPILLARY: 587 mg/dL — AB (ref 65–99)
GLUCOSE-CAPILLARY: 461 mg/dL — AB (ref 65–99)
GLUCOSE-CAPILLARY: 263 mg/dL — AB (ref 65–99)
GLUCOSE-CAPILLARY: 138 mg/dL — AB (ref 65–99)
Glucose-Capillary: 568 mg/dL (ref 65–99)
Glucose-Capillary: 489 mg/dL — ABNORMAL HIGH (ref 65–99)
Glucose-Capillary: 390 mg/dL — ABNORMAL HIGH (ref 65–99)
Glucose-Capillary: 331 mg/dL — ABNORMAL HIGH (ref 65–99)
Glucose-Capillary: 182 mg/dL — ABNORMAL HIGH (ref 65–99)
Glucose-Capillary: 151 mg/dL — ABNORMAL HIGH (ref 65–99)

## 2017-09-05 LAB — MAGNESIUM: Magnesium: 2.1 mg/dL (ref 1.7–2.4)

## 2017-09-05 LAB — PROCALCITONIN: Procalcitonin: <0.1 ng/mL

## 2017-09-05 LAB — LACTIC ACID, PLASMA
LACTIC ACID, VENOUS: 2.9 mmol/L — AB (ref 0.5–1.9)
Lactic Acid, Venous: 2.2 mmol/L (ref 0.5–1.9)

## 2017-09-05 MED ORDER — POTASSIUM CHLORIDE 10 MEQ/100ML IV SOLN
10.0000 meq | INTRAVENOUS | Status: AC
  Administered 2017-09-05 – 2017-09-06 (×4): 10 meq via INTRAVENOUS
  Filled 2017-09-05 (×4): qty 100

## 2017-09-05 MED ORDER — VANCOMYCIN HCL IN DEXTROSE 1-5 GM/200ML-% IV SOLN
1000.0000 mg | Freq: Once | INTRAVENOUS | Status: AC
  Administered 2017-09-05: 1000 mg via INTRAVENOUS
  Filled 2017-09-05: qty 200

## 2017-09-05 MED ORDER — SODIUM CHLORIDE 0.9 % IV SOLN
INTRAVENOUS | Status: DC
  Administered 2017-09-05: 17:00:00 via INTRAVENOUS

## 2017-09-05 MED ORDER — POTASSIUM CHLORIDE 10 MEQ/100ML IV SOLN: 10.0000 meq | INTRAVENOUS | Status: DC

## 2017-09-05 MED ORDER — DEXTROSE-NACL 5-0.45 % IV SOLN
INTRAVENOUS | Status: DC
  Administered 2017-09-05: 100 mL/h via INTRAVENOUS

## 2017-09-05 MED ORDER — SODIUM CHLORIDE 0.9 % IV BOLUS (SEPSIS)
1000.0000 mL | Freq: Once | INTRAVENOUS | Status: AC
  Administered 2017-09-05: 1000 mL via INTRAVENOUS

## 2017-09-05 MED ORDER — DOCUSATE SODIUM 100 MG PO CAPS
100.0000 mg | ORAL_CAPSULE | Freq: Two times a day (BID) | ORAL | Status: DC
  Administered 2017-09-05 – 2017-09-06 (×2): 100 mg via ORAL
  Filled 2017-09-05 (×2): qty 1

## 2017-09-05 MED ORDER — SODIUM CHLORIDE 0.9 % IV SOLN: INTRAVENOUS | Status: DC

## 2017-09-05 MED ORDER — HEPARIN SODIUM (PORCINE) 5000 UNIT/ML IJ SOLN
5000.0000 [IU] | Freq: Three times a day (TID) | INTRAMUSCULAR | Status: DC
  Administered 2017-09-05 – 2017-09-06 (×3): 5000 [IU] via SUBCUTANEOUS
  Filled 2017-09-05 (×2): qty 1

## 2017-09-05 MED ORDER — SENNA 8.6 MG PO TABS
2.0000 | ORAL_TABLET | Freq: Every day | ORAL | Status: DC
  Administered 2017-09-05 – 2017-09-06 (×2): 17.2 mg via ORAL
  Filled 2017-09-05 (×2): qty 2

## 2017-09-05 MED ORDER — BISACODYL 10 MG RE SUPP: 10.0000 mg | Freq: Every day | RECTAL | Status: DC | PRN

## 2017-09-05 MED ORDER — SODIUM CHLORIDE 0.9 % IV SOLN
INTRAVENOUS | Status: DC
  Administered 2017-09-05: 6.1 [IU]/h via INTRAVENOUS
  Administered 2017-09-05: 5.3 [IU]/h via INTRAVENOUS
  Filled 2017-09-05 (×2): qty 1

## 2017-09-05 MED ORDER — ACETAMINOPHEN 325 MG PO TABS: 650.0000 mg | ORAL_TABLET | Freq: Four times a day (QID) | ORAL | Status: DC | PRN

## 2017-09-05 MED ORDER — ONDANSETRON HCL 4 MG/2ML IJ SOLN: 4.0000 mg | Freq: Four times a day (QID) | INTRAMUSCULAR | Status: DC | PRN

## 2017-09-05 MED ORDER — MORPHINE SULFATE (PF) 2 MG/ML IV SOLN
2.0000 mg | Freq: Once | INTRAVENOUS | Status: AC
  Administered 2017-09-05: 2 mg via INTRAVENOUS
  Filled 2017-09-05: qty 1

## 2017-09-05 MED ORDER — ACETAMINOPHEN 650 MG RE SUPP: 650.0000 mg | Freq: Four times a day (QID) | RECTAL | Status: DC | PRN

## 2017-09-05 MED ORDER — MORPHINE SULFATE (PF) 2 MG/ML IV SOLN
1.0000 mg | INTRAVENOUS | Status: DC | PRN
  Administered 2017-09-05 – 2017-09-09 (×4): 2 mg via INTRAVENOUS
  Filled 2017-09-05 (×4): qty 1

## 2017-09-05 MED ORDER — PIPERACILLIN-TAZOBACTAM 3.375 G IVPB 30 MIN
3.3750 g | Freq: Once | INTRAVENOUS | Status: AC
  Administered 2017-09-05: 3.375 g via INTRAVENOUS
  Filled 2017-09-05: qty 50

## 2017-09-05 MED ORDER — ONDANSETRON HCL 4 MG PO TABS: 4.0000 mg | ORAL_TABLET | Freq: Four times a day (QID) | ORAL | Status: DC | PRN

## 2017-09-05 MED ORDER — POTASSIUM CHLORIDE CRYS ER 20 MEQ PO TBCR
40.0000 meq | EXTENDED_RELEASE_TABLET | Freq: Once | ORAL | Status: DC
Start: 2017-09-05 — End: 2017-09-05
  Filled 2017-09-05: qty 2

## 2017-09-05 NOTE — ED Notes (Addendum)
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 D/c'd from here last week with hyperglycemia

## 2017-09-05 NOTE — ED Triage Notes (Signed)
Wife called this am for pt having high bs - reading high for ems. After 400cc fluid now reads 587

## 2017-09-05 NOTE — ED Notes (Signed)
Pharmacy advised of insulin drip order

## 2017-09-05 NOTE — ED Notes (Signed)
Able to get 2nd iv but unable to draw two sets of blood cultures. One set labeled and sent. Pt is a hard stick.

## 2017-09-05 NOTE — H&P (Signed)
[facility] [name] at [name] NAME: Richard [name]    MR#:  [id number]  DATE OF BIRTH:  Mar 14, 1956  DATE OF ADMISSION:  09/05/2017  PRIMARY CARE PHYSICIAN: [facility] [place]   REQUESTING/REFERRING PHYSICIAN: Dr. Jimmye Norman  CHIEF COMPLAINT:  Not feeling well feels very dehydrated No family in the room HISTORY OF PRESENT ILLNESS:  Richard [name]  is a 61 y.o. 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  Patient not able to give much history or review of system he is very dehydrated appears lethargic and sleepy.  No family in the ER. According to the RN patient's wife told sugars have been out of control.  Patient has been having issues obtaining insulin through the pharmacy and given a run around for his Medicaid coverage for insulin.  Not sure if patient is taking insulin appropriately. Patient was found to be in DKA.  He started on IV fluids.  IV insulin drip has been initiated. White count was 20,000.  IV Vanco and Zosyn empirically were given.  No source of infection so far identified.  PAST MEDICAL HISTORY:   Past Medical History:  Diagnosis Date  . CHF (congestive heart failure) ([place])   . Chronic back pain   . Chronic leg pain   . Coronary artery disease   . Diabetes mellitus without complication ([place])   . Hypertension   . Neuropathy     PAST SURGICAL HISTOIRY:   Past Surgical History:  Procedure Laterality Date  . CORONARY ANGIOPLASTY WITH STENT PLACEMENT      SOCIAL HISTORY:   Social History   Tobacco Use  . Smoking status: Former Smoker    Packs/day: 0.50  . Smokeless tobacco: Never Used   Substance Use Topics  . Alcohol use: No    Alcohol/week: 0.0 oz    FAMILY HISTORY:   Family History  Problem Relation Age of Onset  . CAD Mother   . Diabetes Mellitus II Father     DRUG ALLERGIES:  No Known Allergies  REVIEW OF SYSTEMS:  Review of Systems  Constitutional: Negative for chills, fever and weight loss.  HENT: Negative for ear discharge, ear pain and nosebleeds.   Eyes: Negative for blurred vision, pain and discharge.  Respiratory: Negative for sputum production, shortness of breath, wheezing and stridor.   Cardiovascular: Negative for chest pain, palpitations, orthopnea and PND.  Gastrointestinal: Positive for nausea. Negative for abdominal pain, diarrhea and vomiting.  Genitourinary: Negative for frequency and urgency.  Musculoskeletal: Negative for back pain and joint pain.  Neurological: Positive for weakness. Negative for sensory change, speech change and focal weakness.  Psychiatric/Behavioral: Negative for depression and hallucinations. The patient is not nervous/anxious.      MEDICATIONS AT HOME:   Prior to Admission medications   Medication Sig Start Date End Date Taking? Authorizing Provider  atorvastatin (LIPITOR) 40 MG tablet Take 40 mg by mouth every evening.   Yes [provider]  gabapentin (NEURONTIN) 800 MG tablet Take 800 mg by mouth 3 (three) times daily.   Yes [provider]  Insulin Glargine (BASAGLAR KWIKPEN) 100 UNIT/ML SOPN Inject 0.2 mLs (20 Units total) at bedtime into the  skin. 08/29/17  Yes [name], MD  insulin NPH Human (HUMULIN N,NOVOLIN N) 100 UNIT/ML injection Inject 20 Units daily before breakfast into the skin.   Yes [provider]  metoprolol tartrate (LOPRESSOR) 25 MG tablet Take 0.5 tablets (12.5 mg total) by mouth 2 (two) times daily. Patient taking differently: Take 25 mg by mouth 2 (two) times daily.  01/31/16  Yes Jola Schmidt, MD  amiodarone (PACERONE) 400 MG tablet Take 1 tablet (400 mg  total) 2 (two) times daily by mouth. Patient not taking: Reported on 09/05/2017 08/29/17   [name], MD  apixaban (ELIQUIS) 5 MG TABS tablet Take 1 tablet (5 mg total) 2 (two) times daily by mouth. Patient not taking: Reported on 09/05/2017 08/29/17   [name], MD  aspirin EC 81 MG tablet Take 1 tablet (81 mg total) by mouth daily. Patient not taking: Reported on 09/05/2017 01/31/16   Jola Schmidt, MD  GLOBAL EASE INJECT PEN NEEDLES 31G X 5 MM [place]  10/02/16   [provider]  insulin aspart (NOVOLOG) 100 UNIT/ML injection Inject 3 units prior to each meal Patient not taking: Reported on 09/05/2017 08/29/17 08/29/18  [name], MD  metoCLOPramide (REGLAN) 10 MG tablet Take 10 mg by mouth 3 (three) times daily as needed for nausea.    [provider]  Needles & Syringes MISC Use as directed 01/31/16   Jola Schmidt, MD  ondansetron (ZOFRAN) 4 MG tablet Take 4 mg by mouth every 8 (eight) hours as needed for nausea or vomiting.    [provider]  pantoprazole (PROTONIX) 40 MG tablet Take 40 mg by mouth 2 (two) times daily.    [provider]      VITAL SIGNS:  Blood pressure 120/66, pulse (!) 104, temperature (!) 97.2 F (36.2 C), resp. rate 18, height 5\' 11"  (1.803 m), weight 63.5 kg (140 lb), SpO2 100 %.  PHYSICAL EXAMINATION:  GENERAL:  61 y.o.-year-old patient lying in the bed with no acute distress.  Cachectic frail EYES: Pupils equal, round, reactive to light and accommodation. No scleral icterus. Extraocular muscles intact.  HEENT: Head atraumatic, normocephalic. Oropharynx and nasopharynx very dry NECK:  Supple, no jugular venous distention. No thyroid enlargement, no tenderness.  LUNGS: Normal breath sounds bilaterally, no wheezing, rales,rhonchi or crepitation. No use of accessory muscles of respiration.  CARDIOVASCULAR: S1, S2 normal. No murmurs, rubs, or gallops.  Tachycardia ABDOMEN: Soft, nontender, nondistended. Bowel sounds present. No  organomegaly or mass.  EXTREMITIES: No pedal edema, cyanosis, or clubbing.  Right transmetatarsal amputation stump small ulcer over the great foot area no discharge appears dry NEUROLOGIC: Moves all extremities well.  Unable to participate secondary to severe DKA and lethargic PSYCHIATRIC: The patient is alert answers to most questions however very sleepy and lethargic SKIN: No obvious rash, lesion, or ulcer.   LABORATORY PANEL:   CBC Recent Labs  Lab 09/05/17 1138  WBC 20.4*  HGB 8.9*  HCT 28.9*  PLT 409   ------------------------------------------------------------------------------------------------------------------  Chemistries  Recent Labs  Lab 09/05/17 1138  NA 134*  K 4.9  CL 95*  CO2 13*  GLUCOSE 582*  BUN 77*  CREATININE 2.01*  CALCIUM 8.7*  AST 14*  ALT 11*  ALKPHOS 24*  BILITOT 1.7*   ------------------------------------------------------------------------------------------------------------------  Cardiac Enzymes Recent Labs  Lab 09/05/17 1138  TROPONINI 0.06*   ------------------------------------------------------------------------------------------------------------------  RADIOLOGY:  Ct Abdomen Pelvis Wo Contrast  Result Date: 09/05/2017 CLINICAL DATA:  Severe abdominal pain. 61 y.o. yo male with borderline  resectable pancreatic cancer now s/p distal pancreatectomy + splenectomy and excisional liver biopsy (negative) on 07/31/17 at [facility]. Pathology c/w T2N0 pancreatic cancer, negative margins. EXAM: CT ABDOMEN AND PELVIS WITHOUT CONTRAST TECHNIQUE: Multidetector CT imaging of the abdomen and pelvis was performed following the standard protocol without IV contrast. COMPARISON:  08/26/2017 FINDINGS: Lower chest:  Mild circumferential wall thickening distal esophagus. Hepatobiliary: No focal abnormality in the liver on this study without intravenous contrast. There is no evidence for gallstones, gallbladder wall thickening, or pericholecystic fluid. No  intrahepatic or extrahepatic biliary dilation. Pancreas:  Status post distal gastrectomy. Spleen: Status post splenectomy. Adrenals/Urinary Tract: No adrenal nodule or mass. No hydronephrosis in either kidney. No hydroureter. The urinary bladder appears normal for the degree of distention. Stomach/Bowel: Stomach is markedly distended with fluid. Duodenum nondistended. Small bowel is nondilated. Terminal ileum is not well seen. The appendix is normal. Moderate stool volume throughout the colon. Vascular/Lymphatic: There is abdominal aortic atherosclerosis without aneurysm. No abdominal lymphadenopathy is evident although assessment is limited by lack of intraabdominal fat, no intravenous contrast material, and some underlying diffuse soft tissue edema. No pelvic sidewall lymphadenopathy. Reproductive: The prostate gland and seminal vesicles have normal imaging features. Other: There is some trace fluid in the right para colic gutter but no substantial ascites evident. Musculoskeletal: Bone windows reveal no worrisome lytic or sclerotic osseous lesions. IMPRESSION: 1. Limited study given lack of oral and IV contrast. Status post distal pancreatectomy and splenectomy. 2. Marked distention of the stomach which is fluid-filled. Features may be related to dysmotility or a component of outlet obstruction. 3. No evidence for bowel obstruction. No intraperitoneal free air. Minimal intraperitoneal free fluid is identified. 4. Mild circumferential wall thickening distal esophagus. Esophagitis would be a consideration. 5. Diffusely stool-filled colon. In the appropriate clinical setting, this appearance could be compatible with constipation. Electronically Signed   By: Misty Stanley M.D.   On: 09/05/2017 14:11   Dg Chest Port 1 View  Result Date: 09/05/2017 CLINICAL DATA:  Hyperglycemia. History of CHF, coronary artery disease, hypertension, and diabetes. Former smoker. EXAM: PORTABLE CHEST 1 VIEW COMPARISON:  Portable  chest x-ray of August 23, 2017 FINDINGS: The lungs are well-expanded and clear. The heart and pulmonary vascularity are normal. There is calcification in the wall of the aortic arch. There is no pleural effusion. There is multilevel degenerative disc disease of the thoracic spine. IMPRESSION: There is no pneumonia nor other acute cardiopulmonary abnormality. Thoracic aortic atherosclerosis. Electronically Signed   By: David  Martinique M.D.   On: 09/05/2017 11:47    EKG:    Tachycardia IMPRESSION AND PLAN:   Richard [name]  is a 61 y.o. 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  Patient not able to give much history or review of system he is very dehydrated appears lethargic and sleepy.  No family in the ER.  1.  DKA and type 2 diabetes -Patient comes in with not feeling well found to have sugars of 582, elevated anion gap along with acidosis due to DKA -Admit to ICU.  Case discussed with Dr. Rosita Fire -IV fluids -Insulin drip per DKA protocol -BMP q. 8 hourly -Diabetes coordinator consultation  2.  Leukocytosis appears reactive  in the setting of DKA -So far no source of infection found -Right foot amputation stump to be healing well.  There is a area of small ulceration on the great toe no drainage no discharge and looks dry -We will continue to monitor -Hold off antibiotics at present  3.  Pancreatic adenocarcinoma status post distal pancreatectomy and splenectomy October 2018 at [facility] -Surgical abdominal scar healing well.  Patient denies any abdominal pain nausea or vomiting  4.  Ileus with marked distention of the stomach as noted on CT of the abdomen -Consider  GI consultation if needed or surgical consultation to rule out small bowel obstruction -We will keep patient n.p.o. except meds and ice chips -So far patient has not vomited  5.  Anemia of chronic disease -Hemoglobin stable  6.  Acute on chronic kidney disease stage III -Continue IV fluids -Monitor I's and O's, avoid nephrotoxins  7.  DVT prophylaxis -Subcu heparin  All the records are reviewed and case discussed with ED provider. Management plans discussed with the patient, family and they are in agreement.  CODE STATUS: Full  TOTAL criticalTIME TAKING CARE OF THIS PATIENT: 55 minutes.    Samyia Motter M.D on 09/05/2017 at 2:51 PM  Between 7am to 6pm - Pager - [phone]  After 6pm go to [email or web address] - password [email or web address] [facility]  [employer]  Office  [phone]  CC: Primary care physician; [facility] [place]

## 2017-09-05 NOTE — Consult Note (Signed)
Name: Richard [name] MRN: [id number] DOB: 13-Mar-1956    ADMISSION DATE:  09/05/2017 CONSULTATION DATE: 09/05/2017  REFERRING MD : Dr. Posey Pronto   CHIEF COMPLAINT: Hyperglycemia   BRIEF PATIENT DESCRIPTION:  61 yo male with Type II Diabetes Mellitus admitted 11/15 with DKA requiring insulin gtt, acute on chronic renal failure, and ileus  SIGNIFICANT EVENTS  11/15-Pt admitted to ICU   STUDIES:  CT Abd Pelvis 11/15>>Limited study given lack of oral and IV contrast. Status post distal pancreatectomy and splenectomy. Marked distention of the stomach which is fluid-filled. Features may be related to dysmotility or a component of outlet obstruction. No evidence for bowel obstruction. No intraperitoneal free air. Minimal intraperitoneal free fluid is identified. Mild circumferential wall thickening distal esophagus. Esophagitis would be a consideration. Diffusely stool-filled colon. In the appropriate clinical setting, this appearance could be compatible with constipation  HISTORY OF PRESENT ILLNESS:   This is a 61 yo male with a PMH of Neuropathy, HTN, Diabetes Mellitus, CAD, Former Smoker, Former Drug Abuse, Chronic Leg and Back Pain, CHF, and  Borderline Resectable Pancreatic Cancer s/p Distal Pancreatectomy and Splenectomy with negative liver biopsy on 07/31/17 (managed by [facility]).  He was recently discharged from [facility] 11/8 following treatment of ileus, atrial flutter with rvr, and acute renal failure.  During this ER presentation on 11/15 he arrived via EMS with c/o elevated blood sugars, not feeling well, weakness, and poor po intake. Upon arrival to the ER the pt was confused and lethargic lab results revealed pt in DKA, therefore he received 2L NS bolus and insulin gtt started. Per ER notes the pt has been having issues with getting his insulin due to insurance coverage.  He also endorsed abdominal pain, therefore CT Abd Pelvis ordered revealing ileus with marked abdominal distension and stool filled  colon.  He was subsequently admitted to the ICU for further workup and treatment PCCM consulted.  PAST MEDICAL HISTORY :   has a past medical history of CHF (congestive heart failure) ([place]), Chronic back pain, Chronic leg pain, Coronary artery disease, Diabetes mellitus without complication ([place]), Hypertension, and Neuropathy.  has a past surgical history that includes Coronary angioplasty with stent. Prior to Admission medications   Medication Sig Start Date End Date Taking? Authorizing Provider  atorvastatin (LIPITOR) 40 MG tablet Take 40 mg by mouth every evening.   Yes [provider]  gabapentin (NEURONTIN) 800 MG tablet Take 800 mg by mouth 3 (three) times daily.   Yes [provider]  Insulin Glargine (BASAGLAR KWIKPEN) 100 UNIT/ML SOPN Inject 0.2 mLs (20 Units total) at bedtime into the skin. 08/29/17  Yes [name], MD  insulin NPH Human (HUMULIN N,NOVOLIN N) 100 UNIT/ML injection Inject 20 Units daily before breakfast into the skin.   Yes [provider]  metoprolol tartrate (LOPRESSOR) 25 MG tablet Take 0.5 tablets (12.5 mg total) by mouth 2 (two) times daily. Patient taking differently: Take 25 mg by mouth 2 (two) times daily.  01/31/16  Yes Jola Schmidt, MD  amiodarone (PACERONE) 400 MG tablet Take 1 tablet (400 mg total) 2 (two) times daily by mouth. Patient not taking: Reported on 09/05/2017 08/29/17   [name], MD  apixaban (ELIQUIS) 5 MG TABS tablet Take 1 tablet (5 mg total) 2 (two) times daily by mouth. Patient not taking: Reported on 09/05/2017 08/29/17   [name], MD  aspirin EC 81 MG tablet Take 1 tablet (81 mg total) by mouth daily. Patient not taking: Reported on 09/05/2017 01/31/16  Jola Schmidt, MD  GLOBAL EASE INJECT PEN NEEDLES 31G X 5 MM [place]  10/02/16   [provider]  insulin aspart (NOVOLOG) 100 UNIT/ML injection Inject 3 units prior to each meal Patient not taking: Reported on 09/05/2017 08/29/17 08/29/18  [name], MD    metoCLOPramide (REGLAN) 10 MG tablet Take 10 mg by mouth 3 (three) times daily as needed for nausea.    [provider]  Needles & Syringes MISC Use as directed 01/31/16   Jola Schmidt, MD  ondansetron (ZOFRAN) 4 MG tablet Take 4 mg by mouth every 8 (eight) hours as needed for nausea or vomiting.    [provider]  pantoprazole (PROTONIX) 40 MG tablet Take 40 mg by mouth 2 (two) times daily.    [provider]   No Known Allergies  FAMILY HISTORY:  family history includes CAD in his mother; Diabetes Mellitus II in his father. SOCIAL HISTORY:  reports that he has quit smoking. He smoked 0.50 packs per day. he has never used smokeless tobacco. He reports that he uses drugs. Drug: Cocaine. He reports that he does not drink alcohol.  REVIEW OF SYSTEMS: Positives in BOLD  Constitutional: Negative for fever, chills, weight loss, malaise/fatigue and diaphoresis.  HENT: Negative for hearing loss, ear pain, nosebleeds, congestion, sore throat, neck pain, tinnitus and ear discharge.   Eyes: Negative for blurred vision, double vision, photophobia, pain, discharge and redness.  Respiratory: cough, hemoptysis, sputum production, shortness of breath, wheezing and stridor.   Cardiovascular: Negative for chest pain, palpitations, orthopnea, claudication, leg swelling and PND.  Gastrointestinal: heartburn, nausea, vomiting, abdominal pain, diarrhea, constipation, blood in stool and melena.  Genitourinary: Negative for dysuria, urgency, frequency, hematuria and flank pain.  Musculoskeletal: Negative for myalgias, back pain, joint pain and falls.  Skin: Negative for itching and rash.  Neurological: Negative for dizziness, tingling, tremors, sensory change, speech change, focal weakness, seizures, loss of consciousness, weakness and headaches.  Endo/Heme/Allergies: Negative for environmental allergies and polydipsia. Does not bruise/bleed easily.  SUBJECTIVE:  Pt c/o of abdominal  pain, nausea, and shortness of breath   VITAL SIGNS: Temp:  [97.2 F (36.2 C)] 97.2 F (36.2 C) (11/15 1116) Pulse Rate:  [104-109] 104 (11/15 1300) Resp:  [16-23] 16 (11/15 1430) BP: (109-134)/(66-77) 118/70 (11/15 1430) SpO2:  [100 %] 100 % (11/15 1300) Weight:  [63.5 kg (140 lb)] 63.5 kg (140 lb) (11/15 1117)  PHYSICAL EXAMINATION: General: chronically ill appearing cachetic male, NAD  Neuro: lethargic and oriented, follows commands HEENT: supple, no JVD  Cardiovascular: sinus tach, s1s2, no M/R/G Lungs: diminished throughout, even, non labored  Abdomen: faint BS x4, soft, tender, mildly distended  Musculoskeletal: normal bulk and tone, right transmetatarsal amputation  Skin: small dry right foot ulceration  Recent Labs  Lab 08/29/17 1624 09/05/17 1138  NA 134* 134*  K 2.9* 4.9  CL 94* 95*  CO2 29 13*  BUN 17 77*  CREATININE 2.21* 2.01*  GLUCOSE 274* 582*   Recent Labs  Lab 09/05/17 1138  HGB 8.9*  HCT 28.9*  WBC 20.4*  PLT 409   Ct Abdomen Pelvis Wo Contrast  Result Date: 09/05/2017 CLINICAL DATA:  Severe abdominal pain. 61 y.o. yo male with borderline resectable pancreatic cancer now s/p distal pancreatectomy + splenectomy and excisional liver biopsy (negative) on 07/31/17 at [facility]. Pathology c/w T2N0 pancreatic cancer, negative margins. EXAM: CT ABDOMEN AND PELVIS WITHOUT CONTRAST TECHNIQUE: Multidetector CT imaging of the abdomen and pelvis was performed following the standard protocol  without IV contrast. COMPARISON:  08/26/2017 FINDINGS: Lower chest:  Mild circumferential wall thickening distal esophagus. Hepatobiliary: No focal abnormality in the liver on this study without intravenous contrast. There is no evidence for gallstones, gallbladder wall thickening, or pericholecystic fluid. No intrahepatic or extrahepatic biliary dilation. Pancreas:  Status post distal gastrectomy. Spleen: Status post splenectomy. Adrenals/Urinary Tract: No adrenal nodule or mass. No  hydronephrosis in either kidney. No hydroureter. The urinary bladder appears normal for the degree of distention. Stomach/Bowel: Stomach is markedly distended with fluid. Duodenum nondistended. Small bowel is nondilated. Terminal ileum is not well seen. The appendix is normal. Moderate stool volume throughout the colon. Vascular/Lymphatic: There is abdominal aortic atherosclerosis without aneurysm. No abdominal lymphadenopathy is evident although assessment is limited by lack of intraabdominal fat, no intravenous contrast material, and some underlying diffuse soft tissue edema. No pelvic sidewall lymphadenopathy. Reproductive: The prostate gland and seminal vesicles have normal imaging features. Other: There is some trace fluid in the right para colic gutter but no substantial ascites evident. Musculoskeletal: Bone windows reveal no worrisome lytic or sclerotic osseous lesions. IMPRESSION: 1. Limited study given lack of oral and IV contrast. Status post distal pancreatectomy and splenectomy. 2. Marked distention of the stomach which is fluid-filled. Features may be related to dysmotility or a component of outlet obstruction. 3. No evidence for bowel obstruction. No intraperitoneal free air. Minimal intraperitoneal free fluid is identified. 4. Mild circumferential wall thickening distal esophagus. Esophagitis would be a consideration. 5. Diffusely stool-filled colon. In the appropriate clinical setting, this appearance could be compatible with constipation. Electronically Signed   By: Misty Stanley M.D.   On: 09/05/2017 14:11   Dg Chest Port 1 View  Result Date: 09/05/2017 CLINICAL DATA:  Hyperglycemia. History of CHF, coronary artery disease, hypertension, and diabetes. Former smoker. EXAM: PORTABLE CHEST 1 VIEW COMPARISON:  Portable chest x-ray of August 23, 2017 FINDINGS: The lungs are well-expanded and clear. The heart and pulmonary vascularity are normal. There is calcification in the wall of the aortic  arch. There is no pleural effusion. There is multilevel degenerative disc disease of the thoracic spine. IMPRESSION: There is no pneumonia nor other acute cardiopulmonary abnormality. Thoracic aortic atherosclerosis. Electronically Signed   By: Ami Mally  [place] M.D.   On: 09/05/2017 11:47    ASSESSMENT / PLAN: Diabetic Ketoacidosis  Ileus  Constipation  Acute on chronic renal failure   Lactic Acidosis  Mildly elevated troponin likely secondary to demand ischemia  Leukocytosis currently no source of infection  Anemia without acute blood loss P: Continue insulin gtt until anion gap closed and serum CO2 >20 BMP q4hrs and CBG's q1hr while on insulin gtt NS @150  ml/hr will transition to D5W1/2NS @100  ml/hr once CBG's <250 mg/dL Trend BMP Replace electrolytes as indicated Monitor UOP Trend WBC and monitor fever curve Trend PCT and lactic acid Follow cultures Subq heparin for VTE prophylaxis Trend CBC Monitor for s/sx of bleeding  Transfuse for hgb <7 Keep NPO  Will start bowel regimen   Marda Stalker, [place] Pager [phone] (please enter 7 digits) PCCM Consult Pager [phone] (please enter 7 digits)   PCCM ATTENDING ATTESTATION:  I have evaluated patient with the APP [place], reviewed database in its entirety and discussed care plan in detail.  I agree with the above findings, assessment and plan   Merton Border, MD PCCM service Mobile [phone] Pager [phone] 09/05/2017 5:00 PM

## 2017-09-05 NOTE — Progress Notes (Addendum)
Pharmacy Electrolyte Monitoring Consult:  Pharmacy consulted to assist in monitoring and replacing electrolytes in this 61 y.o. male admitted on 09/05/2017 with elevated blood glucose. Patient started on insulin drip for DKA is at risk for hypokalemia.  Labs: Sodium (mmol/L)  Date Value  09/05/2017 137   Potassium (mmol/L)  Date Value  09/05/2017 4.1   Magnesium (mg/dL)  Date Value  09/05/2017 2.1   Phosphorus (mg/dL)  Date Value  08/29/2017 2.4 (L)   Calcium (mg/dL)  Date Value  09/05/2017 8.4 (L)   Albumin (g/dL)  Date Value  09/05/2017 3.1 (L)    Plan: Will replace K with KCl 40 meq po once and f/u next BMET at [place].   Ulice Dash, PharmD Clinical Pharmacist                       Addendum: Patient unable to tolerate enteral potassium per RN. Will replace with KCl 10 meq IV x 4 and f/u next BMET.   11/15 2330 Electrolyte panel. No replacement indicated  11/16 AM K+ 3.3. KCl 10 mEq IV x 2 doses ordered. Recheck BMP in AM.  Sim Boast, PharmD, BCPS  09/06/17 12:19 AM

## 2017-09-05 NOTE — Progress Notes (Signed)
Pharmacy Electrolyte Monitoring Consult:  Pharmacy consulted to assist in monitoring and replacing electrolytes in this 61 y.o. male admitted on 09/05/2017 with elevated blood glucose. Patient started on insulin drip for DKA is at risk for hypokalemia.  Labs: Sodium (mmol/L)  Date Value  09/05/2017 134 (L)   Potassium (mmol/L)  Date Value  09/05/2017 4.9   Magnesium (mg/dL)  Date Value  08/29/2017 2.0   Phosphorus (mg/dL)  Date Value  08/29/2017 2.4 (L)   Calcium (mg/dL)  Date Value  09/05/2017 8.7 (L)   Albumin (g/dL)  Date Value  09/05/2017 3.1 (L)    Plan: Will add-on magnesium to admission labs and follow BMET q 6 hours x 3.   Ulice Dash, PharmD Clinical Pharmacist

## 2017-09-05 NOTE — ED Provider Notes (Signed)
[facility] Emergency Department Provider Note       Time seen: ----------------------------------------- 11:13 AM on 09/05/2017 -----------------------------------------  Level V caveat: History/ROS limited by altered mental status  I have reviewed the triage vital signs and the nursing notes.  HISTORY   Chief Complaint No chief complaint on file.    HPI Richard [name] is a 61 y.o. male with a history of CHF, diabetes and coronary artery disease who presents to the ED for high blood sugars.  Patient presents with lethargy and high blood sugars for an unknown period.  Family called EMS for high blood sugars and weakness but it is unclear how long this is been going on.  Patient is lethargic and confused.  Past Medical History:  Diagnosis Date  . CHF (congestive heart failure) ([place])   . Chronic back pain   . Chronic leg pain   . Coronary artery disease   . Diabetes mellitus without complication ([place])   . Hypertension   . Neuropathy     Patient Active Problem List   Diagnosis Date Noted  . Ileus ([place]) 08/24/2017  . Diabetes ([place]) 08/24/2017  . Atrial flutter ([place]) 08/24/2017  . CAD (coronary artery disease) 08/24/2017  . HTN (hypertension) 08/24/2017  . Atrial fibrillation ([place]) 08/24/2017  . Chest pain 08/10/2015    Past Surgical History:  Procedure Laterality Date  . CORONARY ANGIOPLASTY WITH STENT PLACEMENT      Allergies Patient has no known allergies.  Social History Social History   Tobacco Use  . Smoking status: Current Every Day Smoker    Packs/day: 0.50  . Smokeless tobacco: Never Used  Substance Use Topics  . Alcohol use: No    Alcohol/week: 0.0 oz  . 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  All systems negative/normal/unremarkable except as stated in the  HPI  ____________________________________________   PHYSICAL EXAM:  VITAL SIGNS: ED Triage Vitals  Enc Vitals Group     BP      Pulse      Resp      Temp      Temp src      SpO2      Weight      Height      Head Circumference      Peak Flow      Pain Score      Pain Loc      Pain Edu?      Excl. in [place]?     Constitutional: Lethargic and disoriented, mild distress Eyes: Conjunctivae are normal. Normal extraocular movements. ENT   Head: Normocephalic and atraumatic.   Nose: No congestion/rhinnorhea.   Mouth/Throat: Mucous membranes are dry   Neck: No stridor. Cardiovascular: Normal rate, regular rhythm. No murmurs, rubs, or gallops. Respiratory: Normal respiratory effort without tachypnea nor retractions. Breath sounds are clear and equal bilaterally. No wheezes/rales/rhonchi. Gastrointestinal: Soft and nontender. Normal bowel sounds Musculoskeletal: Nontender with normal range of motion in extremities.  Right foot distal ray amputation, wound is clean, dry and intact Neurologic: Slurred speech, generalized weakness Skin:  Skin is warm, Right foot wound does not appear infected, no erythema or purulent drainage Psychiatric: Mood and affect are normal.  ____________________________________________  EKG: Interpreted by me.  Sinus tachycardia with a rate of 120 bpm, LVH, borderline long QT, normal axis.  ____________________________________________  ED  COURSE:  Pertinent labs & imaging results that were available during my care of the patient were reviewed by me and considered in my medical decision making (see chart for details). Patient presents for weakness with high blood sugars, we will assess with labs and imaging as indicated.  We will initiate sepsis protocols Clinical Course as of Sep 06 1411  Thu Sep 05, 2017  1315 Patient is possibly in DKA and/or sepsis.  He will need IV insulin infusion  [JW]    Clinical Course User Index [JW] Earleen Newport, MD   Procedures ____________________________________________   LABS (pertinent positives/negatives)  Labs Reviewed  LACTIC ACID, PLASMA - Abnormal; Notable for the following components:      Result Value   Lactic Acid, Venous 2.2 (*)    All other components within normal limits  COMPREHENSIVE METABOLIC PANEL - Abnormal; Notable for the following components:   Sodium 134 (*)    Chloride 95 (*)    CO2 13 (*)    Glucose, Bld 582 (*)    BUN 77 (*)    Creatinine, Ser 2.01 (*)    Calcium 8.7 (*)    Albumin 3.1 (*)    AST 14 (*)    ALT 11 (*)    Alkaline Phosphatase 24 (*)    Total Bilirubin 1.7 (*)    GFR calc non Af Amer 34 (*)    GFR calc Af Amer 39 (*)    Anion gap 26 (*)    All other components within normal limits  TROPONIN I - Abnormal; Notable for the following components:   Troponin I 0.06 (*)    All other components within normal limits  CBC WITH DIFFERENTIAL/PLATELET - Abnormal; Notable for the following components:   WBC 20.4 (*)    RBC 3.24 (*)    Hemoglobin 8.9 (*)    HCT 28.9 (*)    MCHC 30.7 (*)    RDW 15.0 (*)    Neutro Abs 18.5 (*)    All other components within normal limits  BLOOD GAS, VENOUS - Abnormal; Notable for the following components:   pH, Ven 7.20 (*)    pCO2, Ven 31 (*)    pO2, Ven <31.0 (*)    Bicarbonate 12.1 (*)    Acid-base deficit 14.5 (*)    All other components within normal limits  URINALYSIS, COMPLETE (UACMP) WITH MICROSCOPIC - Abnormal; Notable for the following components:   Color, Urine STRAW (*)    APPearance CLEAR (*)    Glucose, UA >=500 (*)    Ketones, ur 80 (*)    Squamous Epithelial / LPF 0-5 (*)    All other components within normal limits  GLUCOSE, CAPILLARY - Abnormal; Notable for the following components:   Glucose-Capillary 587 (*)    All other components within normal limits  GLUCOSE, CAPILLARY - Abnormal; Notable for the following components:   Glucose-Capillary >600 (*)    All other components  within normal limits  CULTURE, BLOOD (ROUTINE X 2)  CULTURE, BLOOD (ROUTINE X 2)  URINE CULTURE  LACTIC ACID, PLASMA  BETA-HYDROXYBUTYRIC ACID   CRITICAL CARE Performed by: Earleen Newport   Total critical care time: 30 minutes  Critical care time was exclusive of separately billable procedures and treating other patients.  Critical care was necessary to treat or prevent imminent or life-threatening deterioration.  Critical care was time spent personally by me on the following activities: development of treatment plan with patient and/or surrogate as well as  nursing, discussions with consultants, evaluation of patient's response to treatment, examination of patient, obtaining history from patient or surrogate, ordering and performing treatments and interventions, ordering and review of laboratory studies, ordering and review of radiographic studies, pulse oximetry and re-evaluation of patient's condition.  RADIOLOGY Images were viewed by me  Chest x-ray  IMPRESSION: There is no pneumonia nor other acute cardiopulmonary abnormality.  Thoracic aortic atherosclerosis. ____________________________________________  DIFFERENTIAL DIAGNOSIS   Sepsis, dehydration, HHNK, DKA, electrolyte abnormality  FINAL ASSESSMENT AND PLAN  DKA, possible sepsis   Plan: Patient had presented for high blood sugar and weakness. Patient's labs did reveal market hyperglycemia and likely DKA with high anion gap metabolic acidosis.  He also could be septic and has a source possibly at his right foot. Patient's imaging did not reveal any acute process.  He has previously had surgery at [facility] on his pancreas.  We have started broad-spectrum antibiotics as well as typical fluid resuscitation and started sepsis protocols.  He is currently on an insulin drip as well.  I will discussed with the hospitalist for admission.  Earleen Newport, MD   Note: This note was generated in part or whole with  voice recognition software. Voice recognition is usually quite accurate but there are transcription errors that can and very often do occur. I apologize for any typographical errors that were not detected and corrected.     Earleen Newport, MD 09/05/17 [phone]

## 2017-09-06 LAB — GLUCOSE, CAPILLARY
GLUCOSE-CAPILLARY: 113 mg/dL — AB (ref 65–99)
GLUCOSE-CAPILLARY: 121 mg/dL — AB (ref 65–99)
GLUCOSE-CAPILLARY: 153 mg/dL — AB (ref 65–99)
GLUCOSE-CAPILLARY: 158 mg/dL — AB (ref 65–99)
GLUCOSE-CAPILLARY: 169 mg/dL — AB (ref 65–99)
GLUCOSE-CAPILLARY: 244 mg/dL — AB (ref 65–99)
GLUCOSE-CAPILLARY: 190 mg/dL — AB (ref 65–99)
GLUCOSE-CAPILLARY: 190 mg/dL — AB (ref 65–99)
GLUCOSE-CAPILLARY: 220 mg/dL — AB (ref 65–99)
Glucose-Capillary: 203 mg/dL — ABNORMAL HIGH (ref 65–99)
Glucose-Capillary: 132 mg/dL — ABNORMAL HIGH (ref 65–99)
Glucose-Capillary: 149 mg/dL — ABNORMAL HIGH (ref 65–99)
Glucose-Capillary: 213 mg/dL — ABNORMAL HIGH (ref 65–99)
Glucose-Capillary: 129 mg/dL — ABNORMAL HIGH (ref 65–99)
Glucose-Capillary: 123 mg/dL — ABNORMAL HIGH (ref 65–99)
Glucose-Capillary: 114 mg/dL — ABNORMAL HIGH (ref 65–99)
Glucose-Capillary: 168 mg/dL — ABNORMAL HIGH (ref 65–99)
Glucose-Capillary: 199 mg/dL — ABNORMAL HIGH (ref 65–99)
Glucose-Capillary: 267 mg/dL — ABNORMAL HIGH (ref 65–99)

## 2017-09-06 LAB — BASIC METABOLIC PANEL
ANION GAP: 6 (ref 5–15)
Anion gap: 9 (ref 5–15)
BUN: 56 mg/dL — AB (ref 6–20)
BUN: 49 mg/dL — AB (ref 6–20)
CALCIUM: 8.2 mg/dL — AB (ref 8.9–10.3)
CALCIUM: 8 mg/dL — AB (ref 8.9–10.3)
CO2: 21 mmol/L — ABNORMAL LOW (ref 22–32)
CO2: 23 mmol/L (ref 22–32)
CREATININE: 1.32 mg/dL — AB (ref 0.61–1.24)
CREATININE: 1.2 mg/dL (ref 0.61–1.24)
Chloride: 111 mmol/L (ref 101–111)
Chloride: 111 mmol/L (ref 101–111)
GFR calc Af Amer: >60 mL/min (ref >60)
GFR calc Af Amer: >60 mL/min (ref >60)
GFR calc non Af Amer: >60 mL/min (ref >60)
GFR, EST NON AFRICAN AMERICAN: 56 mL/min — AB (ref >60)
GLUCOSE: 147 mg/dL — AB (ref 65–99)
GLUCOSE: 153 mg/dL — AB (ref 65–99)
Potassium: 3.3 mmol/L — ABNORMAL LOW (ref 3.5–5.1)
Potassium: 3.8 mmol/L (ref 3.5–5.1)
SODIUM: 141 mmol/L (ref 135–145)
Sodium: 140 mmol/L (ref 135–145)

## 2017-09-06 LAB — POTASSIUM: POTASSIUM: 3.4 mmol/L — AB (ref 3.5–5.1)

## 2017-09-06 LAB — URINE CULTURE: CULTURE: NO GROWTH

## 2017-09-06 LAB — PROCALCITONIN

## 2017-09-06 MED ORDER — POTASSIUM CHLORIDE 10 MEQ/100ML IV SOLN
10.0000 meq | INTRAVENOUS | Status: AC
  Administered 2017-09-06 (×4): 10 meq via INTRAVENOUS
  Filled 2017-09-06 (×4): qty 100

## 2017-09-06 MED ORDER — INSULIN ASPART 100 UNIT/ML ~~LOC~~ SOLN
0.0000 [IU] | Freq: Three times a day (TID) | SUBCUTANEOUS | Status: DC
  Administered 2017-09-06: 3 [IU] via SUBCUTANEOUS
  Administered 2017-09-07: 7 [IU] via SUBCUTANEOUS
  Filled 2017-09-06 (×2): qty 1

## 2017-09-06 MED ORDER — SENNOSIDES-DOCUSATE SODIUM 8.6-50 MG PO TABS
2.0000 | ORAL_TABLET | Freq: Two times a day (BID) | ORAL | Status: DC
  Administered 2017-09-06 – 2017-09-10 (×9): 2 via ORAL
  Filled 2017-09-06 (×9): qty 2

## 2017-09-06 MED ORDER — INSULIN GLARGINE 100 UNIT/ML ~~LOC~~ SOLN: SUBCUTANEOUS | 3 refills | Status: DC

## 2017-09-06 MED ORDER — APIXABAN 5 MG PO TABS
5.0000 mg | ORAL_TABLET | Freq: Two times a day (BID) | ORAL | Status: DC
  Administered 2017-09-06 – 2017-09-07 (×4): 5 mg via ORAL
  Filled 2017-09-06 (×5): qty 1

## 2017-09-06 MED ORDER — LIVING WELL WITH DIABETES BOOK
Freq: Once | Status: AC
  Administered 2017-09-06: 1
  Filled 2017-09-06: qty 1

## 2017-09-06 MED ORDER — AMIODARONE HCL 200 MG PO TABS
400.0000 mg | ORAL_TABLET | Freq: Two times a day (BID) | ORAL | Status: DC
  Administered 2017-09-06 – 2017-09-11 (×11): 400 mg via ORAL
  Filled 2017-09-06 (×11): qty 2

## 2017-09-06 MED ORDER — POTASSIUM CHLORIDE 20 MEQ PO PACK
40.0000 meq | PACK | Freq: Once | ORAL | Status: AC
  Administered 2017-09-06: 40 meq via ORAL
  Filled 2017-09-06: qty 2

## 2017-09-06 MED ORDER — POTASSIUM CHLORIDE 10 MEQ/100ML IV SOLN: 10.0000 meq | INTRAVENOUS | Status: DC

## 2017-09-06 MED ORDER — INSULIN GLARGINE 100 UNIT/ML ~~LOC~~ SOLN
12.0000 [IU] | Freq: Every day | SUBCUTANEOUS | Status: DC
  Administered 2017-09-06: 12 [IU] via SUBCUTANEOUS
  Filled 2017-09-06 (×3): qty 0.12

## 2017-09-06 MED ORDER — POTASSIUM CHLORIDE 10 MEQ/100ML IV SOLN
10.0000 meq | INTRAVENOUS | Status: DC
  Filled 2017-09-06 (×3): qty 100

## 2017-09-06 MED ORDER — POTASSIUM CHLORIDE CRYS ER 20 MEQ PO TBCR
40.0000 meq | EXTENDED_RELEASE_TABLET | Freq: Once | ORAL | Status: DC
Start: 2017-09-06 — End: 2017-09-06
  Filled 2017-09-06: qty 2

## 2017-09-06 MED ORDER — INSULIN ASPART 100 UNIT/ML ~~LOC~~ SOLN
0.0000 [IU] | Freq: Every day | SUBCUTANEOUS | Status: DC
  Administered 2017-09-06: 3 [IU] via SUBCUTANEOUS
  Filled 2017-09-06: qty 1

## 2017-09-06 MED ORDER — POTASSIUM CHLORIDE 10 MEQ/100ML IV SOLN
10.0000 meq | INTRAVENOUS | Status: AC
  Administered 2017-09-06 (×2): 10 meq via INTRAVENOUS
  Filled 2017-09-06 (×2): qty 100

## 2017-09-06 NOTE — Progress Notes (Signed)
Pharmacy Electrolyte Monitoring Consult:  Pharmacy consulted to assist in monitoring and replacing electrolytes in this 61 y.o. male admitted on 09/05/2017 with elevated blood glucose. Patient started on insulin drip for DKA is at risk for hypokalemia.  Labs: Sodium (mmol/L)  Date Value  09/06/2017 140   Potassium (mmol/L)  Date Value  09/06/2017 3.3 (L)   Magnesium (mg/dL)  Date Value  09/05/2017 2.1   Phosphorus (mg/dL)  Date Value  08/29/2017 2.4 (L)   Calcium (mg/dL)  Date Value  09/06/2017 8.0 (L)   Albumin (g/dL)  Date Value  09/05/2017 3.1 (L)    Plan: Patient being transitioned off of insulin drip. Patient has received additional potassium 17mEq IV Q1hr x 4 doses.   Will recheck potassium at 1800.   Pharmacy will continue to monitor and adjust per consult.   MLS Clinical Pharmacist

## 2017-09-06 NOTE — Progress Notes (Addendum)
Inpatient Diabetes Program Recommendations  AACE/ADA: New Consensus Statement on Inpatient Glycemic Control (2015)  Target Ranges:  Prepandial:   less than 140 mg/dL      Peak postprandial:   less than 180 mg/dL (1-2 hours)      Critically ill patients:  140 - 180 mg/dL   Lab Results  Component Value Date   GLUCAP 244 (H) 09/06/2017   HGBA1C 8.8 (H) 08/24/2017    Review of Glycemic Control  Results for Richard [name], Richard [name] (MRN [id number]) as of 09/06/2017 09:30  Ref. Range 09/06/2017 05:01 09/06/2017 05:32 09/06/2017 06:35 09/06/2017 07:44 09/06/2017 08:35  Glucose-Capillary Latest Ref Range: 65 - 99 mg/dL 149 (H) 158 (H) 169 (H) 213 (H) 244 (H)    Diabetes history: Type 2 Outpatient Diabetes medications: ordered Basaglar Kwikpen 20 units qhs (states he's not taking), Lantus 9 units @hs ,  Humulin N 20 units with breakfast (states he's taking 3 units)  Current orders for Inpatient glycemic control: IV insulin phase 1 DKA  Inpatient Diabetes Program Recommendations: CO2 23 and Anion Gap 6; recommend continuing patient on IV insulin until CBG are 140-180 mg/dl for a few hours, then give Lantus 12 units 2 hours before the drip is stopped.   Please order Lantus 12 units qday.  Consider adding Novolog 0-9 units tid and Novolog 0-5 units qhs.   Will likely need need mealtime insulin- once he is able to eat, assess needs for mealtime Novolog.   Gentry Fitz, RN, BA, MHA, CDE Diabetes Coordinator Inpatient Diabetes Program  [phone] (Team Pager) [phone] ([place]) 09/06/2017 9:48 AM

## 2017-09-06 NOTE — Progress Notes (Signed)
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   admitted for DKA, lethargy/confusion  ASSESSMENT/PLAN   DKA. Patient has been on protocol, anion gap has resolved, we'll transition  Ileus. Abdominal exam was soft this morning, will follow closely  Acute on chronic renal failure. Initial BUN was 77 over creatinine of 2 which has decreased to 49 over 1.2 with volume repletion  Leukocytosis. No clear evidence of infection, pro calcitonin is within normal range, will follow closely  Anemia. No clear evidence of bleeding, will follow  Hypokalemia. Potassium decreased at 3.3, will replace  Atrial fibrillation with rapid ventricular response. We'll resume amiodarone, patient is on apixaban.  Name: Richard [name] MRN: [id number] DOB: 12/28/1955    ADMISSION DATE:  09/05/2017  SUBJECTIVE:   Patient is responsive but minimally interactive, somewhat sedate  VITAL SIGNS: Temp:  [97.2 F (36.2 C)-98.8 F (37.1 C)] 98.8 F (37.1 C) (11/16 0800) Pulse Rate:  [61-109] 88 (11/16 0800) Resp:  [10-30] 11 (11/16 0800) BP: (84-137)/(48-79) 126/61 (11/16 0800) SpO2:  [97 %-100 %] 100 % (11/16 0800) Weight:  [57.5 kg (126 lb 12.2 oz)-63.5 kg (140 lb)] 57.5 kg (126 lb 12.2 oz) (11/15 1413)   PHYSICAL EXAMINATION: Physical Examination:   VS: BP 126/61 (BP Location: Left Arm)   Pulse 88   Temp 98.8 F (37.1 C) (Oral)   Resp 11   Ht 5\' 10"  (1.778 m)   Wt 57.5 kg (126 lb 12.2 oz)   SpO2 100%   BMI 18.19 kg/m   General Appearance: No distress   HEENT: PERRLA, EOM intact. Pulmonary: normal breath sounds. Diminished breath sounds   Cardiovascular rapid ventricular response, irregularly irregular  rhythm, atrial fibrillation on the monitor Abdomen: Benign, Soft, non-tender.. Endocrine: No evident thyromegaly. Skin:   warm, no rashes, no ecchymosis  Extremities: normal, no cyanosis, clubbing.    LABORATORY PANEL:   CBC Recent Labs  Lab 09/05/17 1138  WBC 20.4*  HGB 8.9*  HCT 28.9*  PLT 409    Chemistries  Recent Labs  Lab 09/05/17 1138  09/06/17 0520  NA 134*   < > 140  K 4.9   < > 3.3*  CL 95*   < > 111  CO2 13*   < > 23  GLUCOSE 582*   < > 153*  BUN 77*   < > 49*  CREATININE 2.01*   < > 1.20  CALCIUM 8.7*   < > 8.0*  MG 2.1  --   --   AST 14*  --   --   ALT 11*  --   --   ALKPHOS 24*  --   --   BILITOT 1.7*  --   --    < > = values in this interval not displayed.    Recent Labs  Lab 09/06/17 0532 09/06/17 0635 09/06/17 0744 09/06/17 0835 09/06/17 0940 09/06/17 1034  GLUCAP 158* 169* 213* 244* 190* 190*   No results for input(s): PHART, PCO2ART, PO2ART in the last 168 hours. Recent Labs  Lab 09/05/17 1138  AST 14*  ALT 11*  ALKPHOS 24*  BILITOT 1.7*  ALBUMIN 3.1*    Cardiac Enzymes Recent Labs  Lab 09/05/17 1138  TROPONINI 0.06*  RADIOLOGY:  Ct Abdomen Pelvis Wo Contrast  Result Date: 09/05/2017 CLINICAL DATA:  Severe abdominal pain. 61 y.o. yo male with borderline resectable pancreatic cancer now s/p distal pancreatectomy + splenectomy and excisional liver biopsy (negative) on 07/31/17 at [facility]. Pathology c/w T2N0 pancreatic cancer, negative margins. EXAM: CT ABDOMEN AND PELVIS WITHOUT CONTRAST TECHNIQUE: Multidetector CT imaging of the abdomen and pelvis was performed following the standard protocol without IV contrast. COMPARISON:  08/26/2017 FINDINGS: Lower chest:  Mild circumferential wall thickening distal esophagus. Hepatobiliary: No focal abnormality in the liver on this study without intravenous contrast. There is no evidence for gallstones, gallbladder wall thickening, or pericholecystic fluid. No intrahepatic or extrahepatic  biliary dilation. Pancreas:  Status post distal gastrectomy. Spleen: Status post splenectomy. Adrenals/Urinary Tract: No adrenal nodule or mass. No hydronephrosis in either kidney. No hydroureter. The urinary bladder appears normal for the degree of distention. Stomach/Bowel: Stomach is markedly distended with fluid. Duodenum nondistended. Small bowel is nondilated. Terminal ileum is not well seen. The appendix is normal. Moderate stool volume throughout the colon. Vascular/Lymphatic: There is abdominal aortic atherosclerosis without aneurysm. No abdominal lymphadenopathy is evident although assessment is limited by lack of intraabdominal fat, no intravenous contrast material, and some underlying diffuse soft tissue edema. No pelvic sidewall lymphadenopathy. Reproductive: The prostate gland and seminal vesicles have normal imaging features. Other: There is some trace fluid in the right para colic gutter but no substantial ascites evident. Musculoskeletal: Bone windows reveal no worrisome lytic or sclerotic osseous lesions. IMPRESSION: 1. Limited study given lack of oral and IV contrast. Status post distal pancreatectomy and splenectomy. 2. Marked distention of the stomach which is fluid-filled. Features may be related to dysmotility or a component of outlet obstruction. 3. No evidence for bowel obstruction. No intraperitoneal free air. Minimal intraperitoneal free fluid is identified. 4. Mild circumferential wall thickening distal esophagus. Esophagitis would be a consideration. 5. Diffusely stool-filled colon. In the appropriate clinical setting, this appearance could be compatible with constipation. Electronically Signed   By: Misty Stanley M.D.   On: 09/05/2017 14:11   Dg Chest Port 1 View  Result Date: 09/05/2017 CLINICAL DATA:  Hyperglycemia. History of CHF, coronary artery disease, hypertension, and diabetes. Former smoker. EXAM: PORTABLE CHEST 1 VIEW COMPARISON:  Portable chest x-ray of August 23, 2017  FINDINGS: The lungs are well-expanded and clear. The heart and pulmonary vascularity are normal. There is calcification in the wall of the aortic arch. There is no pleural effusion. There is multilevel degenerative disc disease of the thoracic spine. IMPRESSION: There is no pneumonia nor other acute cardiopulmonary abnormality. Thoracic aortic atherosclerosis. Electronically Signed   By: David  Martinique M.D.   On: 09/05/2017 11:47     Hermelinda Dellen, DO 09/06/2017

## 2017-09-06 NOTE — Progress Notes (Signed)
PT Cancellation Note  Patient Details Name: Richard [name] MRN: [id number] DOB: 04-Jun-1956   Cancelled Treatment:    Reason Eval/Treat Not Completed: Other (comment). Consult received and chart reviewed. Pt currently lethargic, however able to slightly arouse for initial history taking.  Needs cues to maintain arousal level. Pt reports receiving HHPT, request to resume services post discharge. At baseline uses [facility]. Recent R foot transmet amputation, however no restrictions per patient. When asked to perform formal evaluation, pt refuses at this time, reports he is too sleepy and in pain in B LE (chronic). Requests to re-attempt next date. Will re-attempt.   Jebidiah Baggerly 09/06/2017, 3:55 PM  Greggory Stallion, PT, DPT [phone]

## 2017-09-06 NOTE — Progress Notes (Signed)
Pt has remained alert and oriented with c/o chronic leg and back pain. No pain medication has been requested or given. Pt has remained sleepy and has slept through the majority of the day-pt awakens to verbal stimuli and is oriented x 4. Pt states he feels very weak and has no energy to eat. Full-liquid diet ordered today. Pt only drank 25% of beef broth-pt has lost unintentional weight per fiance. Fiance has been updated at bedside and has requested education for diabetic dieting. Brief education and materials have been printed and given to pt and fiance-needs reiteration. K replaced today-recheck ordered. NSR on cardiac monitor. BP/HR WNL. Pt has remained on RA. Lung sounds clear to auscultation.

## 2017-09-06 NOTE — Care Management Note (Signed)
Case Management Note  Patient Details  Name: Richard [name] MRN: [id number] Date of Birth: April 23, 1956  Subjective/Objective:                  Met with patient and his girlfriend regarding transision of care. He is open to [facility]. Per girlfriend patient would benefit from having a shower chair. She said he has not fully recovered or returned to independence since last admission; about two weeks.  He does not have a rolling walker either. RNCM received consult for medication assistance. Patient states he is new to Eliquis and [employer] on [employer] tells him his insurance doesn't cover his insulin. He is followed at [name] and girlfriend is obtaining a follow up appointment. Girlfriend is working on CAP services for patient with Medicaid.   Action/Plan: RNCM met with patient and provided Eliquis coupon. I also advised patient to reach out to his PCP which has a pharmacy to assist with medications.  He has drug coverage. He is on Insulin Glargine, Humulin N, Novolog, and Eliquis per [facility]. Patient has Medicaid and medicare A B.  Please add Social worker to discharge order. PT evaluation would also be helpful. [facility] notified of patient admission.   Expected Discharge Date:  09/09/17               Expected Discharge Plan:     In-House Referral:     Discharge planning Services     Post Acute Care Choice:  Durable Medical Equipment, Home Health Choice offered to:  Patient, Spouse  DME Arranged:  Shower stool DME Agency:  [facility]:  RN, PT, OT, Nurse's Aide, Social Work [employer] Agency:  [employer] (now [facility])  Status of Service:  In process, will continue to follow  If discussed at Long Length of Stay Meetings, dates discussed:    Additional Comments:  Marshell Garfinkel, RN 09/06/2017, 1:07 PM

## 2017-09-06 NOTE — Consult Note (Signed)
[facility] Nurse wound consult note Reason for Consult: Transmetatarsal amputation.  Dehiscence noted at the medial aspect.  Is dry and stable.  Current orders were to wash this daily with soap and water.  No further orders needed at this time.  Wound type: surgical incision with chronic, stable dehiscence.   Pressure Injury POA: NA Measurement: 12 cm suture line with 0.3 cm dehiscence, dry and stable Wound bed: intact with scabbing at medial end.  Drainage (amount, consistency, odor) none Periwound:intact Dressing procedure/placement/frequency:Wash right foot amputation site daily with soap and water.  reconsult if wound begins to open or drain.  Will not follow at this time.  Please re-consult if needed.  Domenic Moras RN BSN [place] Pager [phone]

## 2017-09-06 NOTE — Progress Notes (Signed)
Pharmacy Electrolyte Monitoring Consult:  Pharmacy consulted to assist in monitoring and replacing electrolytes in this 61 y.o. male admitted on 09/05/2017 with elevated blood glucose. Patient started on insulin drip for DKA is at risk for hypokalemia.  Labs: Sodium (mmol/L)  Date Value  09/06/2017 140   Potassium (mmol/L)  Date Value  09/06/2017 3.4 (L)   Magnesium (mg/dL)  Date Value  09/05/2017 2.1   Phosphorus (mg/dL)  Date Value  08/29/2017 2.4 (L)   Calcium (mg/dL)  Date Value  09/06/2017 8.0 (L)   Albumin (g/dL)  Date Value  09/05/2017 3.1 (L)    Plan: Patient being transitioned off of insulin drip. Patient has received additional potassium 31mEq IV Q1hr x 4 doses.   11/16 1813 K: 3.4. Will order KCL 57mEq x 1 dose PO.  WIll recheck electrolytes with AM labs.   Pharmacy will continue to monitor and adjust per consult.   Pernell Dupre, PharmD, BCPS Clinical Pharmacist 09/06/2017 8:03 PM

## 2017-09-06 NOTE — Progress Notes (Signed)
Initial Nutrition Assessment  DOCUMENTATION CODES:   Severe malnutrition in context of acute illness/injury  INTERVENTION:  Unjury chicken soup BID, each supplement provides 100 calories and 21 grams of protein   NUTRITION DIAGNOSIS:   Severe Malnutrition related to acute illness as evidenced by severe fat depletion, moderate muscle depletion, severe muscle depletion.  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   admitted for DKA, lethargy/confusion Ileus resolving.  Per chart patient exhibits a 10 pound/7.4% severe weight loss over 11 days. Patient reports losing weight recently, but unable to tell me how much. Still lethargic, groggy, unable to provide much history, in significant amounts of pain. States he has been nibbling on food for 2-3 weeks eating jello and other light foods. Blood sugars have been elevated "for a while." Hasn't been hungry but asking for cranberry juice. Didn't provide any other history.  Labs reviewed:  CBGs 168, 114, 123 K 3.3,  TBili 1.7  Medications reviewed and include:  Insulin gtt, Colace, Senokot D5 1/2 NS at 152mL/hr --> 408 calories   Intake/Output Summary (Last 24 hours) at 09/06/2017 1541 Last data filed at 09/06/2017 1400 Gross per 24 hour  Intake 2070.19 ml  Output 1900 ml  Net 170.19 ml      NUTRITION - FOCUSED PHYSICAL EXAM:    Most Recent Value  Orbital Region  Severe depletion  Upper Arm Region  Severe depletion  Thoracic and Lumbar Region  Severe depletion  Buccal Region   Severe depletion  Temple Region  Severe depletion  Clavicle Bone Region  Moderate depletion  Clavicle and Acromion Bone Region  Moderate depletion  Scapular Bone Region  Moderate depletion  Dorsal Hand  Moderate depletion  Patellar Region  Severe depletion  Anterior Thigh Region  Severe depletion  Posterior Calf Region  Moderate depletion  Edema (RD Assessment)  None  Hair  Reviewed  Eyes  Reviewed  Mouth  Reviewed  Skin  Reviewed  Nails  Reviewed       Diet Order:  Diet full liquid Room service appropriate? Yes; Fluid consistency: Thin  EDUCATION NEEDS:   No education needs have been identified at this time  Skin:  Skin Assessment: Skin Integrity Issues: Skin Integrity Issues:: Incisions Incisions: Closed incision to abdomen  Last BM:  09/01/2017  Height:   Ht Readings from Last 1 Encounters:  09/05/17 5\' 10"  (1.778 m)    Weight:   Wt Readings from Last 1 Encounters:  09/05/17 126 lb 12.2 oz (57.5 kg)    Ideal Body Weight:  75.45 kg  BMI:  Body mass index is 18.19 kg/m.  Estimated Nutritional Needs:   Kcal:  1720-2000 calories  Protein:  74-86 grams (1.3-1.5g/kg)  Fluid:  1.7-2L   Satira Anis. Eiza Canniff, MS, RD LDN Inpatient Clinical Dietitian Pager [phone]

## 2017-09-06 NOTE — Progress Notes (Signed)
[place] at [place] NAME: Richard [name]    MR#:  [id number]  DATE OF BIRTH:  1955/12/28  SUBJECTIVE:  CHIEF COMPLAINT:  No chief complaint on file. Came with DKA. Had recent pancreatic surgery and foot surgery. Noted Ileus on CT. With insulin drip Acidosis is corrected, no complains, but feels weak.  REVIEW OF SYSTEMS:  CONSTITUTIONAL: No fever, positive for fatigue or weakness.  EYES: No blurred or double vision.  EARS, NOSE, AND THROAT: No tinnitus or ear pain.  RESPIRATORY: No cough, shortness of breath, wheezing or hemoptysis.  CARDIOVASCULAR: No chest pain, orthopnea, edema.  GASTROINTESTINAL: No nausea, vomiting, diarrhea or abdominal pain.  GENITOURINARY: No dysuria, hematuria.  ENDOCRINE: No polyuria, nocturia,  HEMATOLOGY: No anemia, easy bruising or bleeding SKIN: No rash or lesion. MUSCULOSKELETAL: No joint pain or arthritis.   NEUROLOGIC: No tingling, numbness, weakness.  PSYCHIATRY: No anxiety or depression.   ROS  DRUG ALLERGIES:  No Known Allergies  VITALS:  Blood pressure (!) 94/53, pulse 89, temperature 98 F (36.7 C), temperature source Oral, resp. rate 14, height 5\' 10"  (1.778 m), weight 57.5 kg (126 lb 12.2 oz), SpO2 100 %.  PHYSICAL EXAMINATION:  GENERAL:  61 y.o.-year-old patient lying in the bed with no acute distress.  Cachectic frail EYES: Pupils equal, round, reactive to light and accommodation. No scleral icterus. Extraocular muscles intact.  HEENT: Head atraumatic, normocephalic. Oropharynx and nasopharynx very dry NECK:  Supple, no jugular venous distention. No thyroid enlargement, no tenderness.  LUNGS: Normal breath sounds bilaterally, no wheezing, rales,rhonchi or crepitation. No use of accessory muscles of respiration.  CARDIOVASCULAR: S1, S2 normal. No murmurs, rubs, or gallops.  Tachycardia ABDOMEN: Soft, nontender, nondistended. Bowel sounds present. No organomegaly or mass.  EXTREMITIES: No pedal  edema, cyanosis, or clubbing.  Right transmetatarsal amputation stump small ulcer over the great foot area no discharge appears dry NEUROLOGIC: Moves all extremities well.  Unable to participate secondary to severe DKA and lethargic PSYCHIATRIC: The patient is alert answers to most questions however very sleepy and lethargic SKIN: No obvious rash, lesion, or ulcer.    Physical Exam LABORATORY PANEL:   CBC Recent Labs  Lab 09/05/17 1138  WBC 20.4*  HGB 8.9*  HCT 28.9*  PLT 409   ------------------------------------------------------------------------------------------------------------------  Chemistries  Recent Labs  Lab 09/05/17 1138  09/06/17 0520 09/06/17 1813  NA 134*   < > 140  --   K 4.9   < > 3.3* 3.4*  CL 95*   < > 111  --   CO2 13*   < > 23  --   GLUCOSE 582*   < > 153*  --   BUN 77*   < > 49*  --   CREATININE 2.01*   < > 1.20  --   CALCIUM 8.7*   < > 8.0*  --   MG 2.1  --   --   --   AST 14*  --   --   --   ALT 11*  --   --   --   ALKPHOS 24*  --   --   --   BILITOT 1.7*  --   --   --    < > = values in this interval not displayed.   ------------------------------------------------------------------------------------------------------------------  Cardiac Enzymes Recent Labs  Lab 09/05/17 1138  TROPONINI 0.06*   ------------------------------------------------------------------------------------------------------------------  RADIOLOGY:  Ct Abdomen Pelvis Wo Contrast  Result Date: 09/05/2017 CLINICAL DATA:  Severe  abdominal pain. 61 y.o. yo male with borderline resectable pancreatic cancer now s/p distal pancreatectomy + splenectomy and excisional liver biopsy (negative) on 07/31/17 at [facility]. Pathology c/w T2N0 pancreatic cancer, negative margins. EXAM: CT ABDOMEN AND PELVIS WITHOUT CONTRAST TECHNIQUE: Multidetector CT imaging of the abdomen and pelvis was performed following the standard protocol without IV contrast. COMPARISON:  08/26/2017 FINDINGS:  Lower chest:  Mild circumferential wall thickening distal esophagus. Hepatobiliary: No focal abnormality in the liver on this study without intravenous contrast. There is no evidence for gallstones, gallbladder wall thickening, or pericholecystic fluid. No intrahepatic or extrahepatic biliary dilation. Pancreas:  Status post distal gastrectomy. Spleen: Status post splenectomy. Adrenals/Urinary Tract: No adrenal nodule or mass. No hydronephrosis in either kidney. No hydroureter. The urinary bladder appears normal for the degree of distention. Stomach/Bowel: Stomach is markedly distended with fluid. Duodenum nondistended. Small bowel is nondilated. Terminal ileum is not well seen. The appendix is normal. Moderate stool volume throughout the colon. Vascular/Lymphatic: There is abdominal aortic atherosclerosis without aneurysm. No abdominal lymphadenopathy is evident although assessment is limited by lack of intraabdominal fat, no intravenous contrast material, and some underlying diffuse soft tissue edema. No pelvic sidewall lymphadenopathy. Reproductive: The prostate gland and seminal vesicles have normal imaging features. Other: There is some trace fluid in the right para colic gutter but no substantial ascites evident. Musculoskeletal: Bone windows reveal no worrisome lytic or sclerotic osseous lesions. IMPRESSION: 1. Limited study given lack of oral and IV contrast. Status post distal pancreatectomy and splenectomy. 2. Marked distention of the stomach which is fluid-filled. Features may be related to dysmotility or a component of outlet obstruction. 3. No evidence for bowel obstruction. No intraperitoneal free air. Minimal intraperitoneal free fluid is identified. 4. Mild circumferential wall thickening distal esophagus. Esophagitis would be a consideration. 5. Diffusely stool-filled colon. In the appropriate clinical setting, this appearance could be compatible with constipation. Electronically Signed   By: Misty Stanley M.D.   On: 09/05/2017 14:11   Dg Chest Port 1 View  Result Date: 09/05/2017 CLINICAL DATA:  Hyperglycemia. History of CHF, coronary artery disease, hypertension, and diabetes. Former smoker. EXAM: PORTABLE CHEST 1 VIEW COMPARISON:  Portable chest x-ray of August 23, 2017 FINDINGS: The lungs are well-expanded and clear. The heart and pulmonary vascularity are normal. There is calcification in the wall of the aortic arch. There is no pleural effusion. There is multilevel degenerative disc disease of the thoracic spine. IMPRESSION: There is no pneumonia nor other acute cardiopulmonary abnormality. Thoracic aortic atherosclerosis. Electronically Signed   By: David  Martinique M.D.   On: 09/05/2017 11:47    ASSESSMENT AND PLAN:   Active Problems:   DKA, type 2 (Grandfather)  Richard [name]  is a 61 y.o. 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  Patient not able to give much history or review of system he is very dehydrated appears lethargic and sleepy.  No family in the ER.  1.  DKA and type 2 diabetes -Patient comes in with not feeling well found to have sugars of 582, elevated anion gap along with acidosis due to DKA  -IV fluids -Insulin drip per DKA protocol - corrected now. -Diabetes coordinator consultation  2.  Leukocytosis appears reactive in the setting of DKA -So far no source of infection found -Right foot amputation stump to be healing well.  There is a area of small ulceration on the great toe no drainage no discharge and looks dry -We will continue to monitor -Hold off antibiotics at present  3.  Pancreatic adenocarcinoma status post distal pancreatectomy and  splenectomy October 2018 at [facility] -Surgical abdominal scar healing well.  Patient denies any abdominal pain nausea or vomiting  4.  Ileus with marked distention of the stomach as noted on CT of the abdomen -Consider GI consultation if needed or surgical consultation to rule out small bowel obstruction -We will keep patient n.p.o. except meds and ice chips -So far patient has not vomited  5.  Anemia of chronic disease -Hemoglobin stable  6.  Acute on chronic kidney disease stage III -Continue IV fluids -Monitor I's and O's, avoid nephrotoxins  7.  DVT prophylaxis -Subcu heparin     All the records are reviewed and case discussed with Care Management/Social Workerr. Management plans discussed with the patient, family and they are in agreement.  CODE STATUS: full.  TOTAL TIME TAKING CARE OF THIS PATIENT: 35 minutes.     POSSIBLE D/C IN 1-2 DAYS, DEPENDING ON CLINICAL CONDITION.   Vaughan Basta M.D on 09/06/2017   Between 7am to 6pm - Pager - [phone]  After 6pm go to [email or web address] - password [email or web address] [facility]  Office  [phone]  CC: Primary care physician; Center, [place]  Note: This dictation was prepared with Diplomatic Services operational officer dictation along with smaller phrase technology. Any transcriptional errors that result from this process are unintentional.

## 2017-09-07 ENCOUNTER — Other Ambulatory Visit: Payer: Self-pay

## 2017-09-07 LAB — CBC WITH DIFFERENTIAL/PLATELET
BASOS ABS: 0 10*3/uL (ref 0–0.1)
BASOS PCT: 0 %
Eosinophils Absolute: 0 10*3/uL (ref 0–0.7)
Eosinophils Relative: 0 %
HEMATOCRIT: 21.8 % — AB (ref 40.0–52.0)
HEMOGLOBIN: 6.9 g/dL — AB (ref 13.0–18.0)
Lymphocytes Relative: 13 %
Lymphs Abs: 1.6 10*3/uL (ref 1.0–3.6)
MCH: 28 pg (ref 26.0–34.0)
MCHC: 31.5 g/dL — ABNORMAL LOW (ref 32.0–36.0)
MCV: 88.8 fL (ref 80.0–100.0)
MONOS PCT: 8 %
Monocytes Absolute: 0.9 10*3/uL (ref 0.2–1.0)
NEUTROS ABS: 9.2 10*3/uL — AB (ref 1.4–6.5)
NEUTROS PCT: 79 %
Platelets: 352 10*3/uL (ref 150–440)
RBC: 2.46 MIL/uL — AB (ref 4.40–5.90)
RDW: 14.7 % — ABNORMAL HIGH (ref 11.5–14.5)
WBC: 11.8 10*3/uL — ABNORMAL HIGH (ref 3.8–10.6)

## 2017-09-07 LAB — BASIC METABOLIC PANEL
ANION GAP: 11 (ref 5–15)
BUN: 22 mg/dL — ABNORMAL HIGH (ref 6–20)
CHLORIDE: 105 mmol/L (ref 101–111)
CO2: 19 mmol/L — AB (ref 22–32)
Calcium: 8.1 mg/dL — ABNORMAL LOW (ref 8.9–10.3)
Creatinine, Ser: 1.02 mg/dL (ref 0.61–1.24)
GFR calc non Af Amer: >60 mL/min (ref >60)
GLUCOSE: 319 mg/dL — AB (ref 65–99)
POTASSIUM: 4.1 mmol/L (ref 3.5–5.1)
Sodium: 135 mmol/L (ref 135–145)

## 2017-09-07 LAB — GLUCOSE, CAPILLARY
GLUCOSE-CAPILLARY: 297 mg/dL — AB (ref 65–99)
GLUCOSE-CAPILLARY: 309 mg/dL — AB (ref 65–99)
Glucose-Capillary: 325 mg/dL — ABNORMAL HIGH (ref 65–99)
Glucose-Capillary: 313 mg/dL — ABNORMAL HIGH (ref 65–99)

## 2017-09-07 LAB — PROCALCITONIN: Procalcitonin: <0.1 ng/mL

## 2017-09-07 MED ORDER — INSULIN ASPART 100 UNIT/ML ~~LOC~~ SOLN
0.0000 [IU] | Freq: Every day | SUBCUTANEOUS | Status: DC
  Administered 2017-09-07: 22:00:00 4 [IU] via SUBCUTANEOUS
  Administered 2017-09-08: 2 [IU] via SUBCUTANEOUS
  Filled 2017-09-07 (×2): qty 1

## 2017-09-07 MED ORDER — INSULIN ASPART 100 UNIT/ML ~~LOC~~ SOLN: 0.0000 [IU] | Freq: Three times a day (TID) | SUBCUTANEOUS | Status: DC

## 2017-09-07 MED ORDER — INSULIN ASPART 100 UNIT/ML ~~LOC~~ SOLN: 0.0000 [IU] | Freq: Every day | SUBCUTANEOUS | Status: DC

## 2017-09-07 MED ORDER — INSULIN GLARGINE 100 UNIT/ML ~~LOC~~ SOLN
18.0000 [IU] | Freq: Every day | SUBCUTANEOUS | Status: DC
  Administered 2017-09-07: 18 [IU] via SUBCUTANEOUS
  Filled 2017-09-07 (×2): qty 0.18

## 2017-09-07 MED ORDER — INSULIN ASPART 100 UNIT/ML ~~LOC~~ SOLN
0.0000 [IU] | Freq: Three times a day (TID) | SUBCUTANEOUS | Status: DC
  Administered 2017-09-07: 18:00:00 8 [IU] via SUBCUTANEOUS
  Administered 2017-09-07: 12:00:00 11 [IU] via SUBCUTANEOUS
  Administered 2017-09-08: 13:00:00 8 [IU] via SUBCUTANEOUS
  Administered 2017-09-08: 08:00:00 15 [IU] via SUBCUTANEOUS
  Administered 2017-09-08: 5 [IU] via SUBCUTANEOUS
  Administered 2017-09-09: 18:00:00 2 [IU] via SUBCUTANEOUS
  Administered 2017-09-09 (×2): 11 [IU] via SUBCUTANEOUS
  Administered 2017-09-10 – 2017-09-11 (×4): 5 [IU] via SUBCUTANEOUS
  Filled 2017-09-07 (×12): qty 1

## 2017-09-07 MED ORDER — ALUM & MAG HYDROXIDE-SIMETH 200-200-20 MG/5ML PO SUSP
30.0000 mL | Freq: Once | ORAL | Status: AC
  Administered 2017-09-07: 30 mL via ORAL
  Filled 2017-09-07: qty 30

## 2017-09-07 NOTE — Progress Notes (Signed)
Inpatient Diabetes Program Recommendations  AACE/ADA: New Consensus Statement on Inpatient Glycemic Control (2015)  Target Ranges:  Prepandial:   less than 140 mg/dL      Peak postprandial:   less than 180 mg/dL (1-2 hours)      Critically ill patients:  140 - 180 mg/dL  Results for Richard [name], Richard [name] (MRN [id number]) as of 09/07/2017 07:00  Ref. Range 09/07/2017 05:05  Glucose Latest Ref Range: 65 - 99 mg/dL 319 (H)   Results for Richard [name], Richard [name] (MRN [id number]) as of 09/07/2017 07:00  Ref. Range 09/06/2017 13:42 09/06/2017 14:59 09/06/2017 16:33 09/06/2017 17:59 09/06/2017 22:30  Glucose-Capillary Latest Ref Range: 65 - 99 mg/dL 114 (H) 168 (H) 199 (H) 220 (H) 267 (H)   Review of Glycemic Control  Outpatient Diabetes medications: Lantus 9 units QHS, Humulin N 3 units daily with breakfast  Current orders for Inpatient glycemic control: Lantus 12 units QHS, Novolog 0-9 units TID with meals, Novolog 0-5 units QHS  Inpatient Diabetes Program Recommendations:  Insulin - Basal: Please consider increasing Lantus to 17 units daily at 3pm (received Lantus 12 units on 09/06/17 at 15:43). Insulin - Meal Coverage: Please consider ordering Novolog 4 units TID with meals for meal coverage if patient eats at least 50% of meals. Outpatient DM regimen: Anticipate patient needs an increased Lantus dose along with Novolog meal coverage and correction as an outpatient. At time of discharge, please consider adjusting outpatient DM medications to similar insulin regimen used as an inpatient if glucose is fairly controlled on regimen.   NOTE: In reviewing chart, noted patient was given Lantus 12 units at 15:43 on 09/06/17 at time of transition off IV insulin to SQ insulin.  Lab glucose 319 mg/dl this morning.  Thanks, Barnie Alderman, RN, MSN, CDE Diabetes Coordinator Inpatient Diabetes Program [phone] (Team Pager from 8am to 5pm)

## 2017-09-07 NOTE — Progress Notes (Signed)
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   admitted for DKA, lethargy/confusion  ASSESSMENT/PLAN   DKA. Patient has been on protocol, anion gap has resolved; off insulin infusion.  Change sliding scale to resistant scale  Ileus. Abdominal exam was soft this morning, will follow closely  Acute on chronic renal failure. Initial BUN was 77 over creatinine of 2 which has decreased to 49 over 1.2 with volume repletion  Leukocytosis. No clear evidence of infection, pro calcitonin is within normal range, will follow closely  Anemia. No clear evidence of bleeding, will follow  Hypokalemia-resolved  Atrial fibrillation with rapid ventricular response:  well-controlled on amiodarone and apixaban  Stable for transfer out of the ICU  Name: Richard [name] MRN: [id number] DOB: [birth year]    ADMISSION DATE:  09/05/2017  SUBJECTIVE:  No acute issues overnight.  Tolerating oral intake.  Offers no complaints.  Blood sugar still running in the 200s.  Currently on sensitive sliding scale  VITAL SIGNS: Temp:  [98 F (36.7 C)-98.8 F (37.1 C)] 98.8 F (37.1 C) (11/17 0800) Pulse Rate:  [81-93] 92 (11/17 0800) Resp:  [6-22] 20 (11/17 0800) BP: (94-124)/(46-68) 121/67 (11/17 0800) SpO2:  [98 %-100 %] 100 % (11/17 0800)   PHYSICAL EXAMINATION: Physical Examination:   VS: BP 121/67 (BP Location: Right Arm)   Pulse 92   Temp 98.8 F (37.1 C) (Oral)   Resp 20   Ht 5\' 10"  (1.778 m)   Wt 57.5 kg (126 lb 12.2 oz)   SpO2 100%   BMI 18.19 kg/m   General Appearance: No distress   HEENT: PERRLA, EOM intact. Pulmonary: normal breath sounds. Diminished breath sounds   Cardiovascular rapid  ventricular response, irregularly irregular rhythm, atrial fibrillation on the monitor Abdomen: Benign, Soft, non-tender.. Endocrine: No evident thyromegaly. Skin:   warm, no rashes, no ecchymosis  Extremities: normal, no cyanosis, clubbing.    LABORATORY PANEL:   CBC Recent Labs  Lab 09/05/17 1138  WBC 20.4*  HGB 8.9*  HCT 28.9*  PLT 409    Chemistries  Recent Labs  Lab 09/05/17 1138  09/07/17 0505  NA 134*   < > 135  K 4.9   < > 4.1  CL 95*   < > 105  CO2 13*   < > 19*  GLUCOSE 582*   < > 319*  BUN 77*   < > 22*  CREATININE 2.01*   < > 1.02  CALCIUM 8.7*   < > 8.1*  MG 2.1  --   --   AST 14*  --   --   ALT 11*  --   --   ALKPHOS 24*  --   --   BILITOT 1.7*  --   --    < > = values in this interval not displayed.    Recent Labs  Lab 09/06/17 1342 09/06/17 1459 09/06/17 1633 09/06/17 1759 09/06/17 2230 09/07/17 0754  GLUCAP 114* 168* 199* 220* 267* 325*   No results for input(s): PHART, PCO2ART, PO2ART in the last 168 hours. Recent Labs  Lab 09/05/17 1138  AST 14*  ALT 11*  ALKPHOS 24*  BILITOT 1.7*  ALBUMIN 3.1*    Cardiac Enzymes Recent Labs  Lab 09/05/17 1138  TROPONINI 0.06*    RADIOLOGY:  Ct Abdomen Pelvis Wo Contrast  Result Date: 09/05/2017 CLINICAL DATA:  Severe abdominal pain. 61 y.o. yo male with borderline resectable pancreatic cancer now s/p distal pancreatectomy + splenectomy and excisional liver biopsy (negative) on 07/31/17 at [facility]. Pathology c/w T2N0 pancreatic cancer, negative margins. EXAM: CT ABDOMEN AND PELVIS WITHOUT CONTRAST TECHNIQUE: Multidetector CT imaging of the abdomen and pelvis was performed following the standard protocol without IV contrast. COMPARISON:  08/26/2017 FINDINGS: Lower chest:  Mild circumferential wall thickening distal esophagus. Hepatobiliary: No focal abnormality in the liver on this study without intravenous contrast. There is no evidence for gallstones, gallbladder wall thickening, or  pericholecystic fluid. No intrahepatic or extrahepatic biliary dilation. Pancreas:  Status post distal gastrectomy. Spleen: Status post splenectomy. Adrenals/Urinary Tract: No adrenal nodule or mass. No hydronephrosis in either kidney. No hydroureter. The urinary bladder appears normal for the degree of distention. Stomach/Bowel: Stomach is markedly distended with fluid. Duodenum nondistended. Small bowel is nondilated. Terminal ileum is not well seen. The appendix is normal. Moderate stool volume throughout the colon. Vascular/Lymphatic: There is abdominal aortic atherosclerosis without aneurysm. No abdominal lymphadenopathy is evident although assessment is limited by lack of intraabdominal fat, no intravenous contrast material, and some underlying diffuse soft tissue edema. No pelvic sidewall lymphadenopathy. Reproductive: The prostate gland and seminal vesicles have normal imaging features. Other: There is some trace fluid in the right para colic gutter but no substantial ascites evident. Musculoskeletal: Bone windows reveal no worrisome lytic or sclerotic osseous lesions. IMPRESSION: 1. Limited study given lack of oral and IV contrast. Status post distal pancreatectomy and splenectomy. 2. Marked distention of the stomach which is fluid-filled. Features may be related to dysmotility or a component of outlet obstruction. 3. No evidence for bowel obstruction. No intraperitoneal free air. Minimal intraperitoneal free fluid is identified. 4. Mild circumferential wall thickening distal esophagus. Esophagitis would be a consideration. 5. Diffusely stool-filled colon. In the appropriate clinical setting, this appearance could be compatible with constipation. Electronically Signed   By: Misty Stanley M.D.   On: 09/05/2017 14:11   Dg Chest Port 1 View  Result Date: 09/05/2017 CLINICAL DATA:  Hyperglycemia. History of CHF, coronary artery disease, hypertension, and diabetes. Former smoker. EXAM: PORTABLE CHEST 1 VIEW  COMPARISON:  Portable chest x-ray of August 23, 2017 FINDINGS: The lungs are well-expanded and clear. The heart and pulmonary vascularity are normal. There is calcification in the wall of the aortic arch. There is no pleural effusion. There is multilevel degenerative disc disease of the thoracic spine. IMPRESSION: There is no pneumonia nor other acute cardiopulmonary abnormality. Thoracic aortic atherosclerosis. Electronically Signed   By: David  Martinique M.D.   On: 09/05/2017 11:47     Magdalene S. [facility] ANP-BC Pulmonary and Critical Care [name] Pager [phone] or [phone] 09/07/2017

## 2017-09-07 NOTE — Plan of Care (Signed)
  Progressing Education: Knowledge of General Education information will improve 09/07/2017 1459 - Progressing by Cherylann Parr, RN Health Behavior/Discharge Planning: Ability to manage health-related needs will improve 09/07/2017 1459 - Progressing by Cherylann Parr, RN Clinical Measurements: Ability to maintain clinical measurements within normal limits will improve 09/07/2017 1459 - Progressing by Cherylann Parr, RN Will remain free from infection 09/07/2017 1459 - Progressing by Cherylann Parr, RN Diagnostic test results will improve 09/07/2017 1459 - Progressing by Cherylann Parr, RN Respiratory complications will improve 09/07/2017 1459 - Progressing by Cherylann Parr, RN Cardiovascular complication will be avoided 09/07/2017 1459 - Progressing by Cherylann Parr, RN Activity: Risk for activity intolerance will decrease 09/07/2017 1459 - Progressing by Cherylann Parr, RN Nutrition: Adequate nutrition will be maintained 09/07/2017 1459 - Progressing by Cherylann Parr, RN Coping: Level of anxiety will decrease 09/07/2017 1459 - Progressing by Cherylann Parr, RN Elimination: Will not experience complications related to bowel motility 09/07/2017 1459 - Progressing by Cherylann Parr, RN Will not experience complications related to urinary retention 09/07/2017 1459 - Progressing by Cherylann Parr, RN Pain Managment: General experience of comfort will improve 09/07/2017 1459 - Progressing by Cherylann Parr, RN Safety: Ability to remain free from injury will improve 09/07/2017 1459 - Progressing by Cherylann Parr, RN Skin Integrity: Risk for impaired skin integrity will decrease 09/07/2017 1459 - Progressing by Cherylann Parr, RN

## 2017-09-07 NOTE — Progress Notes (Signed)
Pharmacy Electrolyte Monitoring Consult:  Pharmacy consulted to assist in monitoring and replacing electrolytes in this 61 y.o. male admitted on 09/05/2017 with elevated blood glucose. Patient started on insulin drip for DKA is at risk for hypokalemia.  Labs: Sodium (mmol/L)  Date Value  09/07/2017 135   Potassium (mmol/L)  Date Value  09/07/2017 4.1   Magnesium (mg/dL)  Date Value  09/05/2017 2.1   Phosphorus (mg/dL)  Date Value  08/29/2017 2.4 (L)   Calcium (mg/dL)  Date Value  09/07/2017 8.1 (L)   Albumin (g/dL)  Date Value  09/05/2017 3.1 (L)    Plan: Patient being transitioned off of insulin drip. Patient has received additional potassium 82mEq IV Q1hr x 4 doses.   11/16 1813 K: 3.4. Will order KCL 36mEq x 1 dose PO.  WIll recheck electrolytes with AM labs.   11/17  K 4.1 Patient now on Lantus/ SSI. No supplementation at this time. Will f/u with am labs  Pharmacy will continue to monitor and adjust per consult.   Noralee Space, PharmD, BCPS Clinical Pharmacist 09/07/2017 11:21 AM

## 2017-09-07 NOTE — Progress Notes (Signed)
[place] [facility] [place] NAME: Richard [name]    MR#:  [id number]  DATE OF BIRTH:  10-Nov-1955  SUBJECTIVE:  CHIEF COMPLAINT:  No chief complaint on file.  Feels weak. No abd pain Afebrile  BS - 356  REVIEW OF SYSTEMS:  CONSTITUTIONAL: No fever, positive for fatigue or weakness.  EYES: No blurred or double vision.  EARS, NOSE, AND THROAT: No tinnitus or ear pain.  RESPIRATORY: No cough, shortness of breath, wheezing or hemoptysis.  CARDIOVASCULAR: No chest pain, orthopnea, edema.  GASTROINTESTINAL: No nausea, vomiting, diarrhea or abdominal pain.  GENITOURINARY: No dysuria, hematuria.  ENDOCRINE: No polyuria, nocturia,  HEMATOLOGY: No anemia, easy bruising or bleeding SKIN: No rash or lesion. MUSCULOSKELETAL: No joint pain or arthritis.   NEUROLOGIC: No tingling, numbness, weakness.  PSYCHIATRY: No anxiety or depression.   ROS  DRUG ALLERGIES:  No Known Allergies  VITALS:  Blood pressure 121/67, pulse 92, temperature 98.8 F (37.1 C), temperature source Oral, resp. rate 20, height 5\' 10"  (1.778 m), weight 57.5 kg (126 lb 12.2 oz), SpO2 100 %.  PHYSICAL EXAMINATION:  GENERAL:  61 y.o.-year-old patient lying in the bed with no acute distress.   frail EYES: Pupils equal, round, reactive to light and accommodation. No scleral icterus. Extraocular muscles intact.  HEENT: Head atraumatic, normocephalic. Oropharynx and nasopharynx very dry NECK:  Supple, no jugular venous distention. No thyroid enlargement, no tenderness.  LUNGS: Normal breath sounds bilaterally, no wheezing, rales,rhonchi or crepitation. No use of accessory muscles of respiration.  CARDIOVASCULAR: S1, S2 normal. No murmurs, rubs, or gallops.  Tachycardia ABDOMEN: Soft, nontender, nondistended. Bowel sounds present. No organomegaly or mass. Scar EXTREMITIES: No pedal edema, cyanosis, or clubbing.  Right transmetatarsal amputation stump small ulcer over the great foot area no  discharge appears dry NEUROLOGIC: Motor 5/5 PSYCHIATRIC: The patient is alert and oriented SKIN: No obvious rash, lesion, or ulcer.    Physical Exam LABORATORY PANEL:   CBC Recent Labs  Lab 09/05/17 1138  WBC 20.4*  HGB 8.9*  HCT 28.9*  PLT 409   ------------------------------------------------------------------------------------------------------------------  Chemistries  Recent Labs  Lab 09/05/17 1138  09/07/17 0505  NA 134*   < > 135  K 4.9   < > 4.1  CL 95*   < > 105  CO2 13*   < > 19*  GLUCOSE 582*   < > 319*  BUN 77*   < > 22*  CREATININE 2.01*   < > 1.02  CALCIUM 8.7*   < > 8.1*  MG 2.1  --   --   AST 14*  --   --   ALT 11*  --   --   ALKPHOS 24*  --   --   BILITOT 1.7*  --   --    < > = values in this interval not displayed.   ------------------------------------------------------------------------------------------------------------------  Cardiac Enzymes Recent Labs  Lab 09/05/17 1138  TROPONINI 0.06*   ------------------------------------------------------------------------------------------------------------------  RADIOLOGY:  Ct Abdomen Pelvis Wo Contrast  Result Date: 09/05/2017 CLINICAL DATA:  Severe abdominal pain. 61 y.o. yo male with borderline resectable pancreatic cancer now s/p distal pancreatectomy + splenectomy and excisional liver biopsy (negative) on 07/31/17 at [facility]. Pathology c/w T2N0 pancreatic cancer, negative margins. EXAM: CT ABDOMEN AND PELVIS WITHOUT CONTRAST TECHNIQUE: Multidetector CT imaging of the abdomen and pelvis was performed following the standard protocol without IV contrast. COMPARISON:  08/26/2017 FINDINGS: Lower chest:  Mild circumferential wall thickening distal esophagus. Hepatobiliary: No  focal abnormality in the liver on this study without intravenous contrast. There is no evidence for gallstones, gallbladder wall thickening, or pericholecystic fluid. No intrahepatic or extrahepatic biliary dilation. Pancreas:   Status post distal gastrectomy. Spleen: Status post splenectomy. Adrenals/Urinary Tract: No adrenal nodule or mass. No hydronephrosis in either kidney. No hydroureter. The urinary bladder appears normal for the degree of distention. Stomach/Bowel: Stomach is markedly distended with fluid. Duodenum nondistended. Small bowel is nondilated. Terminal ileum is not well seen. The appendix is normal. Moderate stool volume throughout the colon. Vascular/Lymphatic: There is abdominal aortic atherosclerosis without aneurysm. No abdominal lymphadenopathy is evident although assessment is limited by lack of intraabdominal fat, no intravenous contrast material, and some underlying diffuse soft tissue edema. No pelvic sidewall lymphadenopathy. Reproductive: The prostate gland and seminal vesicles have normal imaging features. Other: There is some trace fluid in the right para colic gutter but no substantial ascites evident. Musculoskeletal: Bone windows reveal no worrisome lytic or sclerotic osseous lesions. IMPRESSION: 1. Limited study given lack of oral and IV contrast. Status post distal pancreatectomy and splenectomy. 2. Marked distention of the stomach which is fluid-filled. Features may be related to dysmotility or a component of outlet obstruction. 3. No evidence for bowel obstruction. No intraperitoneal free air. Minimal intraperitoneal free fluid is identified. 4. Mild circumferential wall thickening distal esophagus. Esophagitis would be a consideration. 5. Diffusely stool-filled colon. In the appropriate clinical setting, this appearance could be compatible with constipation. Electronically Signed   By: Misty Stanley M.D.   On: 09/05/2017 14:11   Dg Chest Port 1 View  Result Date: 09/05/2017 CLINICAL DATA:  Hyperglycemia. History of CHF, coronary artery disease, hypertension, and diabetes. Former smoker. EXAM: PORTABLE CHEST 1 VIEW COMPARISON:  Portable chest x-ray of August 23, 2017 FINDINGS: The lungs are  well-expanded and clear. The heart and pulmonary vascularity are normal. There is calcification in the wall of the aortic arch. There is no pleural effusion. There is multilevel degenerative disc disease of the thoracic spine. IMPRESSION: There is no pneumonia nor other acute cardiopulmonary abnormality. Thoracic aortic atherosclerosis. Electronically Signed   By: David  Martinique M.D.   On: 09/05/2017 11:47    ASSESSMENT AND PLAN:   Active Problems:   DKA, type 2 ([place])  Richard [name]  is a 61 y.o. 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  DKA and type 2 diabetes DKA resolved. Increase lantus today. Increased sliding scale.  2.  Leukocytosis appears reactive in the setting of DKA -So far no source of infection found -Right foot amputation stump to be healing well.  There is a area of small ulceration on the great toe no drainage no discharge and looks dry -Hold off antibiotics at present Check cbc today  3.  Pancreatic adenocarcinoma status post distal pancreatectomy and splenectomy October 2018 at [facility] -Surgical abdominal scar healing well.  Patient denies any abdominal pain nausea or vomiting  4.  Ileus with marked distention of the stomach as noted on CT of the abdomen? Had normal BM  5.  Anemia of chronic disease -Hemoglobin stable  6.  Acute on chronic kidney disease stage III Resolved  7.  DVT prophylaxis -On Eliquis  All the records  are reviewed and case discussed with Care Management/Social Workerr. Management plans discussed with the patient, family and they are in agreement.  CODE STATUS: Full.  TOTAL TIME TAKING CARE OF THIS PATIENT: 35 minutes.    POSSIBLE D/C IN 1-2 DAYS, DEPENDING ON CLINICAL CONDITION.  [name] M.D on 09/07/2017   Between 7am to 6pm - Pager - [phone]  After 6pm go to [email or web address] - password [email or web address] [facility]  Office  [phone]  CC: Primary care physician; [facility], [place]  Note: This dictation was prepared with Diplomatic Services operational officer dictation along with smaller phrase technology. Any transcriptional errors that result from this process are unintentional.

## 2017-09-07 NOTE — Progress Notes (Signed)
PT Cancellation Note  Patient Details Name: Richard [name] MRN: [id number] DOB: 11-21-1955   Cancelled Treatment:    Reason Eval/Treat Not Completed: Patient declined, no reason specified.  Will try again later as pt is willing to work.   Ramond Dial 09/07/2017, 10:01 AM   Mee Hives, PT MS Acute Rehab Dept. Number: [place] and [place]

## 2017-09-08 DIAGNOSIS — D62 Acute posthemorrhagic anemia: Secondary | ICD-10-CM

## 2017-09-08 DIAGNOSIS — K228 Other specified diseases of esophagus: Secondary | ICD-10-CM

## 2017-09-08 DIAGNOSIS — R935 Abnormal findings on diagnostic imaging of other abdominal regions, including retroperitoneum: Secondary | ICD-10-CM

## 2017-09-08 LAB — BASIC METABOLIC PANEL
Anion gap: 5 (ref 5–15)
BUN: 14 mg/dL (ref 6–20)
CALCIUM: 7.9 mg/dL — AB (ref 8.9–10.3)
CO2: 23 mmol/L (ref 22–32)
CREATININE: 0.9 mg/dL (ref 0.61–1.24)
Chloride: 102 mmol/L (ref 101–111)
GFR calc Af Amer: >60 mL/min (ref >60)
GFR calc non Af Amer: >60 mL/min (ref >60)
GLUCOSE: 325 mg/dL — AB (ref 65–99)
Potassium: 3.8 mmol/L (ref 3.5–5.1)
SODIUM: 130 mmol/L — AB (ref 135–145)

## 2017-09-08 LAB — GLUCOSE, CAPILLARY
GLUCOSE-CAPILLARY: 225 mg/dL — AB (ref 65–99)
GLUCOSE-CAPILLARY: 247 mg/dL — AB (ref 65–99)
Glucose-Capillary: 385 mg/dL — ABNORMAL HIGH (ref 65–99)
Glucose-Capillary: 277 mg/dL — ABNORMAL HIGH (ref 65–99)

## 2017-09-08 LAB — ABO/RH: ABO/RH(D): O POS

## 2017-09-08 LAB — CBC
HEMATOCRIT: 19.4 % — AB (ref 40.0–52.0)
HEMOGLOBIN: 6.4 g/dL — AB (ref 13.0–18.0)
MCH: 29 pg (ref 26.0–34.0)
MCHC: 33.2 g/dL (ref 32.0–36.0)
MCV: 87.3 fL (ref 80.0–100.0)
Platelets: 363 10*3/uL (ref 150–440)
RBC: 2.22 MIL/uL — ABNORMAL LOW (ref 4.40–5.90)
RDW: 14.6 % — ABNORMAL HIGH (ref 11.5–14.5)
WBC: 9.5 10*3/uL (ref 3.8–10.6)

## 2017-09-08 LAB — MAGNESIUM: MAGNESIUM: 1.8 mg/dL (ref 1.7–2.4)

## 2017-09-08 LAB — PREPARE RBC (CROSSMATCH)

## 2017-09-08 MED ORDER — INSULIN GLARGINE 100 UNIT/ML ~~LOC~~ SOLN
22.0000 [IU] | Freq: Every day | SUBCUTANEOUS | Status: DC
  Administered 2017-09-08 – 2017-09-09 (×2): 22 [IU] via SUBCUTANEOUS
  Filled 2017-09-08 (×2): qty 0.22

## 2017-09-08 MED ORDER — PANTOPRAZOLE SODIUM 40 MG IV SOLR
40.0000 mg | Freq: Two times a day (BID) | INTRAVENOUS | Status: DC
  Administered 2017-09-08 – 2017-09-11 (×7): 40 mg via INTRAVENOUS
  Filled 2017-09-08 (×7): qty 40

## 2017-09-08 MED ORDER — POLYETHYLENE GLYCOL 3350 17 G PO PACK
17.0000 g | PACK | Freq: Two times a day (BID) | ORAL | Status: AC
  Administered 2017-09-08 – 2017-09-09 (×2): 17 g via ORAL
  Filled 2017-09-08 (×2): qty 1

## 2017-09-08 MED ORDER — SODIUM CHLORIDE 0.9 % IV SOLN
Freq: Once | INTRAVENOUS | Status: AC
  Administered 2017-09-08: 12:00:00 via INTRAVENOUS

## 2017-09-08 MED ORDER — INSULIN ASPART 100 UNIT/ML ~~LOC~~ SOLN
4.0000 [IU] | Freq: Three times a day (TID) | SUBCUTANEOUS | Status: DC
  Administered 2017-09-08 – 2017-09-09 (×5): 4 [IU] via SUBCUTANEOUS
  Filled 2017-09-08 (×5): qty 1

## 2017-09-08 NOTE — Progress Notes (Signed)
Pharmacy Electrolyte Monitoring Consult:  Pharmacy consulted to assist in monitoring and replacing electrolytes in this 61 y.o. male admitted on 09/05/2017 with elevated blood glucose. Patient started on insulin drip for DKA is at risk for hypokalemia.  Labs: Sodium (mmol/L)  Date Value  09/08/2017 130 (L)   Potassium (mmol/L)  Date Value  09/08/2017 3.8   Magnesium (mg/dL)  Date Value  09/08/2017 1.8   Phosphorus (mg/dL)  Date Value  08/29/2017 2.4 (L)   Calcium (mg/dL)  Date Value  09/08/2017 7.9 (L)   Albumin (g/dL)  Date Value  09/05/2017 3.1 (L)    Plan: Patient being transitioned off of insulin drip. Patient has received additional potassium 8mEq IV Q1hr x 4 doses.   11/16 1813 K: 3.4. Will order KCL 53mEq x 1 dose PO.  WIll recheck electrolytes with AM labs.   11/17  K 4.1 Patient now on Lantus/ SSI. No supplementation at this time. Will f/u with am labs  11/18  K 3.8. No supplementation at this time. Will f/u with am labs  Pharmacy will continue to monitor and adjust per consult.   Noralee Space, PharmD, BCPS Clinical Pharmacist 09/08/2017 2:12 PM

## 2017-09-08 NOTE — Progress Notes (Signed)
[facility] NAME: Richard [name]    MR#:  [id number]  DATE OF BIRTH:  1955/12/05  SUBJECTIVE:  CHIEF COMPLAINT:  No chief complaint on file.  Feels weak. No abd pain Afebrile  No melena  Has been having dysphagia and burning in chest with eating since NG tube placed last admission.  REVIEW OF SYSTEMS:  CONSTITUTIONAL: No fever, positive for fatigue or weakness.  EYES: No blurred or double vision.  EARS, NOSE, AND THROAT: No tinnitus or ear pain.  RESPIRATORY: No cough, shortness of breath, wheezing or hemoptysis.  CARDIOVASCULAR: No chest pain, orthopnea, edema.  GASTROINTESTINAL: No nausea, vomiting, diarrhea or abdominal pain.  GENITOURINARY: No dysuria, hematuria.  ENDOCRINE: No polyuria, nocturia,  HEMATOLOGY: No anemia, easy bruising or bleeding SKIN: No rash or lesion. MUSCULOSKELETAL: No joint pain or arthritis.   NEUROLOGIC: No tingling, numbness, weakness.  PSYCHIATRY: No anxiety or depression.   ROS  DRUG ALLERGIES:  No Known Allergies  VITALS:  Blood pressure (!) 112/59, pulse 73, temperature 98.5 F (36.9 C), temperature source Oral, resp. rate 18, height 5\' 10"  (1.778 m), weight 57.5 kg (126 lb 12.2 oz), SpO2 100 %.  PHYSICAL EXAMINATION:  GENERAL:  61 y.o.-year-old patient lying in the bed with no acute distress.   frail EYES: Pupils equal, round, reactive to light and accommodation. No scleral icterus. Extraocular muscles intact.  HEENT: Head atraumatic, normocephalic. Oropharynx and nasopharynx very dry NECK:  Supple, no jugular venous distention. No thyroid enlargement, no tenderness.  LUNGS: Normal breath sounds bilaterally, no wheezing, rales,rhonchi or crepitation. No use of accessory muscles of respiration.  CARDIOVASCULAR: S1, S2 normal. No murmurs, rubs, or gallops.  Tachycardia ABDOMEN: Soft, nontender, nondistended. Bowel sounds present. No organomegaly or mass. Scar EXTREMITIES: No pedal edema,  cyanosis, or clubbing.  Right transmetatarsal amputation stump small ulcer over the great foot area no discharge appears dry NEUROLOGIC: Motor 5/5 PSYCHIATRIC: The patient is alert and oriented SKIN: No obvious rash, lesion, or ulcer.    Physical Exam LABORATORY PANEL:   CBC Recent Labs  Lab 09/08/17 0508  WBC 9.5  HGB 6.4*  HCT 19.4*  PLT 363   ------------------------------------------------------------------------------------------------------------------  Chemistries  Recent Labs  Lab 09/05/17 1138  09/08/17 0508  NA 134*   < > 130*  K 4.9   < > 3.8  CL 95*   < > 102  CO2 13*   < > 23  GLUCOSE 582*   < > 325*  BUN 77*   < > 14  CREATININE 2.01*   < > 0.90  CALCIUM 8.7*   < > 7.9*  MG 2.1  --  1.8  AST 14*  --   --   ALT 11*  --   --   ALKPHOS 24*  --   --   BILITOT 1.7*  --   --    < > = values in this interval not displayed.   ------------------------------------------------------------------------------------------------------------------  Cardiac Enzymes Recent Labs  Lab 09/05/17 1138  TROPONINI 0.06*   ------------------------------------------------------------------------------------------------------------------  RADIOLOGY:  No results found.  ASSESSMENT AND PLAN:   Active Problems:   DKA, type 2 ([place])  Richard [name]  is a 61 y.o. 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  Acute blood loss anemia He does have associated severe dysphagia.  Will need to rule out esophagitis or ulcers.  Likely GI source.  Discussed with Dr. Bonna Gains of GI.  Will start IV Protonix. Transfuse 2 units packed RBC.  Will need to stabilize patient.   If any worsening we will have to transfer to ICU.  *  DKA and type 2 diabetes DKA resolved. Increased lantus . Increased sliding scale.  *  Leukocytosis appears reactive in the setting of DKA Resolved  *  Pancreatic adenocarcinoma status post distal pancreatectomy and splenectomy October 2018 at [facility] -Surgical abdominal scar healing well.  Patient denies any abdominal pain nausea or vomiting  *  Ileus with marked distention of the stomach as noted on CT of the abdomen? Had normal BM  *  Acute on chronic kidney disease stage III Resolved  * Afib In NSR  *  DVT prophylaxis SCDs  All the records are reviewed and case discussed with Care Management/Social Workerr. Management plans discussed with the patient, family and they are in agreement.  CODE STATUS: Full.  TOTAL CC TIME TAKING CARE OF THIS PATIENT: 35 minutes.   POSSIBLE D/C IN 1-2 DAYS, DEPENDING ON CLINICAL CONDITION.  [name] M.D on 09/08/2017   Between 7am to 6pm - Pager - [phone]  After 6pm go to [email or web address] - password [email or web address] [facility]  Office  [phone]  CC: Primary care physician; [facility] [place]  Note: This dictation was prepared with Diplomatic Services operational officer dictation along with smaller phrase technology. Any transcriptional errors that result from this process are unintentional.

## 2017-09-08 NOTE — Progress Notes (Signed)
PT Cancellation Note  Patient Details Name: Richard [name] MRN: [id number] DOB: 1956-01-04   Cancelled Treatment:    Reason Eval/Treat Not Completed: Medical issues which prohibited therapy; Pt's HgB 6.4 and HCT 19.4 both trending down.  Will hold PT evaluation per guidelines and will attempt to see pt at a future date as medically appropriate.     Linus Salmons PT, DPT 09/08/17, 10:19 AM

## 2017-09-08 NOTE — Plan of Care (Signed)
Pt alert and oriented. Morphine given x1 for left foot pain. Relief noted

## 2017-09-08 NOTE — Progress Notes (Signed)
Pt requesting to speak with rounding MD before getting blood transfusion, pt has never had transfusion before and wants more info from MD

## 2017-09-08 NOTE — Consult Note (Addendum)
Vonda Antigua, MD [address] [place], [place], [place], [address] [address], [place], [place], [place], [address] Phone: [phone]  Fax: [phone]  Consultation  Referring Provider:   Dr. Darvin Neighbours   Primary Care Physician:  [facility] [place] [facility] Primary Gastroenterologist:  Virgel Manifold, MD        Reason for Consultation:    Esophageal thickening, anemia  Date of Admission:  09/05/2017 Date of Consultation:  09/08/2017         HPI:   Keghan Mcfarren is a 61 y.o. male with a recent history of pancreatic adenocarcinoma status post distal tail pancreatectomy and splenectomy at [facility] in October 2018, right toe amputation due to diabetes in October 2018, presented with weakness at home, and admitted with DKA, with GI consulted for findings of esophageal thickening and stomach distention noted on CT scan.  Patient is tolerating an oral diet and denies any nausea vomiting.  Denies any blood in stool.  Reports brown bowel movement every day.  Patient is noted to have a decline in hemoglobin from 10.3 on November 8 to 8.9 on admission and 6.4 today.  Review of outside records show the patient had an EGD on August 28 which revealed normal esophagus, gastritis and normal duodenum.  EUS revealed mass in the pancreatic body with pancreatic duct dilation at that time.  He was staged him to be T3 N0 MX, with FNA of the mass showing adenocarcinoma.  Post surgery pathology was consistent with T2N0 pancreatic cancer, negative margins.  Patient denies any NSAID use at home.  Patient was recently discharged on November 8 at which time he was admitted for sepsis from unknown source, and ileus with no evidence of bowel obstruction.  He was seen by surgery and had an NG tube that was placed and then removed when symptoms improved and patient tolerated a solid diet and was discharged home subsequently.  Patient reports intermittent pain on swallowing since the NG tube was placed on  last admission.  Patient is on Eliquis for A. fib, last dose was yesterday evening.  Past Medical History:  Diagnosis Date  . Atrial fibrillation ([place])    per patient  . CHF (congestive heart failure) ([place])   . Chronic back pain   . Chronic leg pain   . Coronary artery disease   . Diabetes mellitus without complication ([place])   . Hypertension   . Neuropathy     Past Surgical History:  Procedure Laterality Date  . CORONARY ANGIOPLASTY WITH STENT PLACEMENT      Prior to Admission medications   Medication Sig Start Date End Date Taking? Authorizing Provider  atorvastatin (LIPITOR) 40 MG tablet Take 40 mg by mouth every evening.   Yes [provider]  gabapentin (NEURONTIN) 800 MG tablet Take 800 mg by mouth 3 (three) times daily.   Yes [provider]  Insulin Glargine (BASAGLAR KWIKPEN) 100 UNIT/ML SOPN Inject 0.2 mLs (20 Units total) at bedtime into the skin. 08/29/17  Yes [name], MD  insulin NPH Human (HUMULIN N,NOVOLIN N) 100 UNIT/ML injection Inject 20 Units daily before breakfast into the skin.   Yes [provider]  metoprolol tartrate (LOPRESSOR) 25 MG tablet Take 0.5 tablets (12.5 mg total) by mouth 2 (two) times daily. Patient taking differently: Take 25 mg by mouth 2 (two) times daily.  01/31/16  Yes Jola Schmidt, MD  amiodarone (PACERONE) 400 MG tablet Take 1 tablet (400 mg total) 2 (two) times daily by  mouth. Patient not taking: Reported on 09/05/2017 08/29/17   [name], MD  apixaban (ELIQUIS) 5 MG TABS tablet Take 1 tablet (5 mg total) 2 (two) times daily by mouth. Patient not taking: Reported on 09/05/2017 08/29/17   [name], MD  aspirin EC 81 MG tablet Take 1 tablet (81 mg total) by mouth daily. Patient not taking: Reported on 09/05/2017 01/31/16   Jola Schmidt, MD  GLOBAL EASE INJECT PEN NEEDLES 31G X 5 MM [place]  10/02/16   [provider]  insulin aspart (NOVOLOG) 100 UNIT/ML injection Inject 3 units prior to each  meal Patient not taking: Reported on 09/05/2017 08/29/17 08/29/18  [name], MD  insulin glargine (LANTUS) 100 UNIT/ML injection Inject 0.95mLs (20 units total) at bedtime into the skin. 09/06/17 09/06/18  Vaughan Basta, MD  metoCLOPramide (REGLAN) 10 MG tablet Take 10 mg by mouth 3 (three) times daily as needed for nausea.    [provider]  Needles & Syringes MISC Use as directed 01/31/16   Jola Schmidt, MD  ondansetron (ZOFRAN) 4 MG tablet Take 4 mg by mouth every 8 (eight) hours as needed for nausea or vomiting.    [provider]  pantoprazole (PROTONIX) 40 MG tablet Take 40 mg by mouth 2 (two) times daily.    [provider]    Family History  Problem Relation Age of Onset  . CAD Mother   . Diabetes Mellitus II Father      Social History   Tobacco Use  . Smoking status: Former Smoker    Packs/day: 0.50  . Smokeless tobacco: Never Used  Substance Use Topics  . Alcohol use: No    Alcohol/week: 0.0 oz  . 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 (No Known Allergies)    Review of Systems:    All systems reviewed and negative except where noted in HPI.   Physical Exam:  Vital signs in last 24 hours: Temp:  [98.1 F (36.7 C)-99.1 F (37.3 C)] 98.1 F (36.7 C) (11/18 1825) Pulse Rate:  [73-80] 76 (11/18 1825) Resp:  [16-18] 16 (11/18 1530) BP: (93-119)/(46-71) 111/59 (11/18 1825) SpO2:  [99 %-100 %] 100 % (11/18 1825) Last BM Date: 09/07/17 General:   Pleasant, cooperative in NAD Head:  Normocephalic and atraumatic. Eyes:   No icterus.   Conjunctiva pink. PERRLA. Ears:  Normal auditory acuity. Neck:  Supple; no masses or thyroidomegaly Lungs: Respirations even and unlabored. Lungs clear to auscultation bilaterally.   No wheezes, crackles, or rhonchi.  Heart:  Regular rate and rhythm;  Without murmur, clicks, rubs or gallops Abdomen:  Soft, nondistended, nontender.  Normal bowel sounds. No appreciable masses or hepatomegaly.  No rebound or guarding.  Neurologic:  Alert and oriented x3;  grossly normal neurologically. Skin:  Intact without significant lesions or rashes. Cervical Nodes:  No significant cervical adenopathy. Psych:  Alert and cooperative. Normal affect.  LAB RESULTS: Recent Labs    09/07/17 0505 09/08/17 0508  WBC 11.8* 9.5  HGB 6.9* 6.4*  HCT 21.8* 19.4*  PLT 352 363   BMET Recent Labs    09/06/17 0520 09/06/17 1813 09/07/17 0505 09/08/17 0508  NA 140  --  135 130*  K 3.3* 3.4* 4.1 3.8  CL 111  --  105 102  CO2 23  --  19* 23  GLUCOSE 153*  --  319* 325*  BUN 49*  --  22* 14  CREATININE 1.20  --  1.02 0.90  CALCIUM 8.0*  --  8.1* 7.9*   LFT No results for input(s): PROT, ALBUMIN, AST, ALT, ALKPHOS, BILITOT, BILIDIR, IBILI in the last 72 hours. PT/INR No results for input(s): LABPROT, INR in the last 72 hours.  STUDIES: No results found.    Impression / Plan:   Armanii Pressnell is a 61 y.o. y/o male with with recent diagnosis of pancreatic adenocarcinoma who is status post distal tail resection, T2 N0 MX, and splenectomy admitted with DKA, and CT showing esophageal thickening and stomach distention with acute anemia and Eliquis use at home  Possible esophagitis, peptic ulcer disease leading to acute anemia and CT findings CT images reviewed by my self.  Area of stomach distention on CT will also need to be evaluated given his recent surgery in the area to evaluate for any gastric outlet obstruction.  However, patient does not have clinical symptoms of such, no nausea vomiting tolerating oral diet well  Patient's last Eliquis dose was yesterday evening.  Please continue to hold if okay with cardiology.  Will need to wait for 2-3 days for endoscopic procedure per guidelines, as endoscopic hemostasis is considered a high-risk procedure, while EGD with biopsies is a low risk procedure.  However, if patient has signs of active  GI bleeding, benefits of procedure might outweigh the risks at that time and procedure can be done sooner. Diagnostic EGD can be considered sooner if patient's acute anemia resolves and need for therapeutic intervention during EGD is lower.   No signs of active GI bleeding at this time, no indication for emergent EGD Continue Protonix 40 mg IV twice daily Avoid NSAIDs Continue serial CBCs and transfuse as needed Continue medical optimization including correction of electrolyte abnormalities and better control of blood glucose Clear liquid diet okay   Thank you for involving me in the care of this patient.      LOS: 3 days   Virgel Manifold, MD  09/08/2017, 6:33 PM

## 2017-09-08 NOTE — Progress Notes (Signed)
Patient to receive two units of blood, spoke with Dr. Darvin Neighbours regarding to possible need for lasix due to patient hx of CHF. Per MD no lasix at the time, monitor patient.

## 2017-09-09 DIAGNOSIS — K921 Melena: Secondary | ICD-10-CM

## 2017-09-09 LAB — CBC WITH DIFFERENTIAL/PLATELET
BASOS PCT: 1 %
Basophils Absolute: 0.1 10*3/uL (ref 0–0.1)
EOS PCT: 1 %
Eosinophils Absolute: 0 10*3/uL (ref 0–0.7)
HCT: 28.5 % — ABNORMAL LOW (ref 40.0–52.0)
HEMOGLOBIN: 9.5 g/dL — AB (ref 13.0–18.0)
Lymphocytes Relative: 12 %
Lymphs Abs: 1.2 10*3/uL (ref 1.0–3.6)
MCH: 29.1 pg (ref 26.0–34.0)
MCHC: 33.3 g/dL (ref 32.0–36.0)
MCV: 87.2 fL (ref 80.0–100.0)
MONO ABS: 0.7 10*3/uL (ref 0.2–1.0)
MONOS PCT: 7 %
Neutro Abs: 8.1 10*3/uL — ABNORMAL HIGH (ref 1.4–6.5)
Neutrophils Relative %: 79 %
PLATELETS: 358 10*3/uL (ref 150–440)
RBC: 3.26 MIL/uL — AB (ref 4.40–5.90)
RDW: 14.7 % — ABNORMAL HIGH (ref 11.5–14.5)
WBC: 10.1 10*3/uL (ref 3.8–10.6)

## 2017-09-09 LAB — BPAM RBC
Blood Product Expiration Date: 201812122359
Blood Product Expiration Date: 201812122359
ISSUE DATE / TIME: 201811181127
ISSUE DATE / TIME: 201811181745
Unit Type and Rh: 5100
Unit Type and Rh: 5100

## 2017-09-09 LAB — TYPE AND SCREEN
ABO/RH(D): O POS
Antibody Screen: NEGATIVE
Unit division: 0
Unit division: 0

## 2017-09-09 LAB — GLUCOSE, CAPILLARY
GLUCOSE-CAPILLARY: 332 mg/dL — AB (ref 65–99)
GLUCOSE-CAPILLARY: 130 mg/dL — AB (ref 65–99)
Glucose-Capillary: 317 mg/dL — ABNORMAL HIGH (ref 65–99)
Glucose-Capillary: 112 mg/dL — ABNORMAL HIGH (ref 65–99)

## 2017-09-09 LAB — BASIC METABOLIC PANEL
Anion gap: 4 — ABNORMAL LOW (ref 5–15)
BUN: 12 mg/dL (ref 6–20)
CALCIUM: 8 mg/dL — AB (ref 8.9–10.3)
CO2: 22 mmol/L (ref 22–32)
CREATININE: 0.92 mg/dL (ref 0.61–1.24)
Chloride: 104 mmol/L (ref 101–111)
GFR calc Af Amer: >60 mL/min (ref >60)
GFR calc non Af Amer: >60 mL/min (ref >60)
GLUCOSE: 323 mg/dL — AB (ref 65–99)
Potassium: 3.5 mmol/L (ref 3.5–5.1)
Sodium: 130 mmol/L — ABNORMAL LOW (ref 135–145)

## 2017-09-09 MED ORDER — INSULIN ASPART 100 UNIT/ML ~~LOC~~ SOLN
8.0000 [IU] | Freq: Three times a day (TID) | SUBCUTANEOUS | Status: DC
  Administered 2017-09-09 – 2017-09-11 (×5): 8 [IU] via SUBCUTANEOUS
  Filled 2017-09-09 (×5): qty 1

## 2017-09-09 MED ORDER — INSULIN GLARGINE 100 UNIT/ML ~~LOC~~ SOLN
28.0000 [IU] | Freq: Every day | SUBCUTANEOUS | Status: DC
  Administered 2017-09-10 – 2017-09-11 (×2): 28 [IU] via SUBCUTANEOUS
  Filled 2017-09-09 (×3): qty 0.28

## 2017-09-09 NOTE — Progress Notes (Signed)
Richard Antigua, MD [address] [place], [place], [place], [address] [address], [place], [place], [place], [address] Phone: [phone]  Fax: [phone]   Subjective: Pt. Reports one BM today that was black. Tolerating PO diet. No abdominal pain.    Objective: Vital signs in last 24 hours: Vitals:   09/09/17 1149 09/09/17 1329 09/09/17 1425 09/09/17 1955  BP:  127/69  (!) 90/52  Pulse: 74 76  72  Resp:  18  14  Temp:  (!) 97.5 F (36.4 C) 98.2 F (36.8 C) 98.8 F (37.1 C)  TempSrc:  Oral Oral Oral  SpO2: 98% 100%  99%  Weight:      Height:       Weight change:   Intake/Output Summary (Last 24 hours) at 09/09/2017 2133 Last data filed at 09/09/2017 2100 Gross per 24 hour  Intake 600 ml  Output 600 ml  Net 0 ml     Exam: Cardiac: +S1, +S2, RRR, No edema Pulm: CTA b/l, Normal Resp Effort Abd: Soft, NT/ND, No HSM Skin: Warm, no rashes Neck: Supple, Trachea midline   Lab Results: @LABTEST2 @ Micro Results: Recent Results (from the past 240 hour(s))  Blood Culture (routine x 2)     Status: None (Preliminary result)   Collection Time: 09/05/17 11:49 AM  Result Value Ref Range Status   Specimen Description BLOOD RIGHT ANTECUBITAL  Final   Special Requests   Final    BOTTLES DRAWN AEROBIC AND ANAEROBIC Blood Culture adequate volume   Culture NO GROWTH 4 DAYS  Final   Report Status PENDING  Incomplete  Urine culture     Status: None   Collection Time: 09/05/17 12:01 PM  Result Value Ref Range Status   Specimen Description URINE, RANDOM  Final   Special Requests NONE  Final   Culture   Final    NO GROWTH Performed at [facility], [address] [place], [place] [address]    Report Status 09/06/2017 FINAL  Final  Blood Culture (routine x 2)     Status: None (Preliminary result)   Collection Time: 09/05/17  4:13 PM  Result Value Ref Range Status   Specimen Description BLOOD BLOOD RIGHT HAND  Final   Special Requests   Final   BOTTLES DRAWN AEROBIC AND ANAEROBIC Blood Culture results may not be optimal due to an inadequate volume of blood received in culture bottles   Culture NO GROWTH 4 DAYS  Final   Report Status PENDING  Incomplete   Studies/Results: No results found. Medications:  Scheduled Meds: . amiodarone  400 mg Oral BID  . insulin aspart  0-15 Units Subcutaneous TID WC  . insulin aspart  0-5 Units Subcutaneous QHS  . insulin aspart  8 Units Subcutaneous TID WC  . [START ON 09/10/2017] insulin glargine  28 Units Subcutaneous Daily  . pantoprazole (PROTONIX) IV  40 mg Intravenous Q12H  . polyethylene glycol  17 g Oral BID  . senna-docusate  2 tablet Oral BID   Continuous Infusions: PRN Meds:.acetaminophen **OR** acetaminophen, bisacodyl, morphine injection, ondansetron **OR** ondansetron (ZOFRAN) IV   Assessment: Active Problems:   DKA, type 2 ([place])  Anemia Melena  Plan: Will plan for EGD on Wed 11/21 to allow 3-4 days off Eliquis to perform EGD safely   Obtain PT/INR and correct any coagulopathy  Please repeat Trop as last one on admission was elevated. Obtain cardiology eval and clearance accordingly.  Continue Protonix IV BID  Avoid NSAIDs  Continue Serial  CBCs and transfuse PRN  If patient has acute hemodynamic change or signs of active GI bleeding please page on call GI to evaluate for need of emergent EGD   LOS: 4 days   Richard Antigua, MD 09/09/2017, 9:33 PM

## 2017-09-09 NOTE — Care Management Important Message (Signed)
Important Message  Patient Details  Name: Richard [name] MRN: [id number] Date of Birth: [birth year]   Medicare Important Message Given:  Yes    Marshell Garfinkel, RN 09/09/2017, 8:15 AM

## 2017-09-09 NOTE — Progress Notes (Addendum)
Inpatient Diabetes Program Recommendations  AACE/ADA: New Consensus Statement on Inpatient Glycemic Control (2015)  Target Ranges:  Prepandial:   less than 140 mg/dL      Peak postprandial:   less than 180 mg/dL (1-2 hours)      Critically ill patients:  140 - 180 mg/dL   Results for ELL, TISO (MRN [id number]) as of 09/09/2017 12:43  Ref. Range 09/08/2017 07:53 09/08/2017 12:44 09/08/2017 16:56 09/08/2017 21:18 09/09/2017 08:03 09/09/2017 11:33  Glucose-Capillary Latest Ref Range: 65 - 99 mg/dL 385 (H) 277 (H) 225 (H) 247 (H) 332 (H) 317 (H)   Review of Glycemic Control  Diabetes history:DM2 Outpatient Diabetes medications: Lantus 9 units QHS, Humulin N 3 units daily with breakfast  Current orders for Inpatient glycemic control: Lantus 22 units QHS, Novolog 0-15 units TID with meals, Novolog 0-5 units QHS, Novolog 4 units TID with meals  Inpatient Diabetes Program Recommendations:  Insulin - Basal: Please consider increasing Lantus to 26 units daily. Insulin - Meal Coverage: Please consider increasing meal coverage to Novolog 8 units TID with meals.  Thanks, Barnie Alderman, RN, MSN, CDE Diabetes Coordinator Inpatient Diabetes Program [phone] (Team Pager from 8am to 5pm)

## 2017-09-09 NOTE — Progress Notes (Signed)
[place] [facility] [place] NAME: Richard [name]    MR#:  [id number]  DATE OF BIRTH:  April 24, 1956  SUBJECTIVE:  CHIEF COMPLAINT:  No chief complaint on file.  No abvd pain, vomiting.  Black tarry stool overnight.  REVIEW OF SYSTEMS:  CONSTITUTIONAL: No fever, positive for fatigue or weakness.  EYES: No blurred or double vision.  EARS, NOSE, AND THROAT: No tinnitus or ear pain.  RESPIRATORY: No cough, shortness of breath, wheezing or hemoptysis.  CARDIOVASCULAR: No chest pain, orthopnea, edema.  GASTROINTESTINAL: No nausea, vomiting, diarrhea or abdominal pain.  GENITOURINARY: No dysuria, hematuria.  ENDOCRINE: No polyuria, nocturia,  HEMATOLOGY: No anemia, easy bruising or bleeding SKIN: No rash or lesion. MUSCULOSKELETAL: No joint pain or arthritis.   NEUROLOGIC: No tingling, numbness, weakness.  PSYCHIATRY: No anxiety or depression.   ROS  DRUG ALLERGIES:  No Known Allergies  VITALS:  Blood pressure 111/68, pulse 76, temperature 99.1 F (37.3 C), temperature source Oral, resp. rate 18, height 5\' 10"  (1.778 m), weight 57.5 kg (126 lb 12.2 oz), SpO2 100 %.  PHYSICAL EXAMINATION:  GENERAL:  61 y.o.-year-old patient lying in the bed with no acute distress.   frail EYES: Pupils equal, round, reactive to light and accommodation. No scleral icterus. Extraocular muscles intact.  HEENT: Head atraumatic, normocephalic. Oropharynx and nasopharynx very dry NECK:  Supple, no jugular venous distention. No thyroid enlargement, no tenderness.  LUNGS: Normal breath sounds bilaterally, no wheezing, rales,rhonchi or crepitation. No use of accessory muscles of respiration.  CARDIOVASCULAR: S1, S2 normal. No murmurs, rubs, or gallops.  Tachycardia ABDOMEN: Soft, nontender, nondistended. Bowel sounds present. No organomegaly or mass. Scar EXTREMITIES: No pedal edema, cyanosis, or clubbing.  Right transmetatarsal amputation stump small ulcer over the great foot  area no discharge appears dry NEUROLOGIC: Motor 5/5 PSYCHIATRIC: The patient is alert and oriented SKIN: No obvious rash, lesion, or ulcer.   Physical Exam LABORATORY PANEL:   CBC Recent Labs  Lab 09/09/17 0718  WBC 10.1  HGB 9.5*  HCT 28.5*  PLT 358   ------------------------------------------------------------------------------------------------------------------  Chemistries  Recent Labs  Lab 09/05/17 1138  09/08/17 0508 09/09/17 0504  NA 134*   < > 130* 130*  K 4.9   < > 3.8 3.5  CL 95*   < > 102 104  CO2 13*   < > 23 22  GLUCOSE 582*   < > 325* 323*  BUN 77*   < > 14 12  CREATININE 2.01*   < > 0.90 0.92  CALCIUM 8.7*   < > 7.9* 8.0*  MG 2.1  --  1.8  --   AST 14*  --   --   --   ALT 11*  --   --   --   ALKPHOS 24*  --   --   --   BILITOT 1.7*  --   --   --    < > = values in this interval not displayed.   ------------------------------------------------------------------------------------------------------------------  Cardiac Enzymes Recent Labs  Lab 09/05/17 1138  TROPONINI 0.06*   ------------------------------------------------------------------------------------------------------------------  RADIOLOGY:  No results found.  ASSESSMENT AND PLAN:   Active Problems:   DKA, type 2 ([place])  Richard [name]  is a 61 y.o. 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  Acute blood loss anemia He does have associated severe dysphagia.  Will need to rule out esophagitis or ulcers.  Black tarry stool overnight. On IV protonix Transfused 2 units packed RBC and HB improved to 9 Will need EGD. Last dose Eliquis 09/07/2017.  *  DKA and type 2  diabetes DKA resolved. Increased lantus . Increased sliding scale.  *  Leukocytosis appears reactive in the setting of DKA Resolved  *  Pancreatic adenocarcinoma status post distal pancreatectomy and splenectomy October 2018 at [facility].  Patient denies any abdominal pain nausea or vomiting  *  Ileus with marked distention of the stomach as noted on CT of the abdomen? Having regular BMs. No abd distention or pain  *  Acute on chronic kidney disease stage III Resolved  * Afib In NSR  *  DVT prophylaxis SCDs  All the records are reviewed and case discussed with Care Management/Social Worker Management plans discussed with the patient, family and they are in agreement.  CODE STATUS: Full.  TOTAL CC TIME TAKING CARE OF THIS PATIENT: 35 minutes.   POSSIBLE D/C IN 1-2 DAYS, DEPENDING ON CLINICAL CONDITION.  [name] M.D on 09/09/2017   Between 7am to 6pm - Pager - [phone]  After 6pm go to [email or web address] - password [email or web address] [facility]  Office  [phone]  CC: Primary care physician; [facility], [place]  Note: This dictation was prepared with Diplomatic Services operational officer dictation along with smaller phrase technology. Any transcriptional errors that result from this process are unintentional.

## 2017-09-09 NOTE — Care Management (Signed)
RNCM attempted to meet with patient but he was sleeping- no family at bedside.  I have updated [facility]. Corene Cornea with [facility] aware that he may need a shower chair.

## 2017-09-09 NOTE — Evaluation (Signed)
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  Assessment includes: Acute on chronic anemia, DKA now resolved, leukocytosis, pancreatic adenocarcinoma, possible Ileus, acute on chronic kidney disease III, and a-fib.    Clinical Impression  Pt presents with deficits in strength, transfers, mobility, gait, balance, and activity tolerance.  Pt SBA with bed mobility tasks but required extra time and effort.  Pt CGA with sit to/from stand transfers and again required some extra effort but no physical assistance.  Pt steady with amb without LOB but fatigued after 20' and required return to sitting.  SpO2 97% and HR 88 bpm after amb compared to 98% and 74 bpm at baseline.  Pt will benefit from HHPT services upon discharge to safely address above deficits for decreased caregiver assistance and eventual return to PLOF.      Follow Up Recommendations Home health PT (Per pt, was actively receiving HHPT and nursing prior to this admission)    Equipment Recommendations  Rolling walker with 5" wheels    Recommendations for Other Services       Precautions / Restrictions Precautions Precautions: Fall;Other (comment) Precaution Comments: R foot transmetatarsal amputation Aug 2018 Required Braces or Orthoses: Other Brace/Splint Other Brace/Splint: [place] unloading shoe to R foot with WB activities Restrictions Weight Bearing Restrictions: No      Mobility  Bed Mobility Overal bed mobility: Needs Assistance Bed Mobility: Supine to Sit     Supine  to sit: Supervision     General bed mobility comments: Extra time and effort for tasks  Transfers Overall transfer level: Needs assistance Equipment used: Rolling walker (2 wheeled) Transfers: Sit to/from Stand Sit to Stand: Min guard         General transfer comment: Extra time and effort but no physical assistance required  Ambulation/Gait Ambulation/Gait assistance: Min guard Ambulation Distance (Feet): 20 Feet Assistive device: Rolling walker (2 wheeled) Gait Pattern/deviations: Step-through pattern;Decreased step length - right;Decreased step length - left;Trunk flexed   Gait velocity interpretation: Below normal speed for age/gender General Gait Details: Pt steady with amb without LOB but fatigued after 20' and required return to sitting.  SpO2 97% and HR 88 bpm after amb compared to 98% and 74 bpm at baseline.    Stairs Stairs: (Deferred)          Wheelchair Mobility    Modified Rankin (Stroke Patients Only)       Balance Overall balance assessment: Needs assistance Sitting-balance support: Feet unsupported;Feet supported;No upper extremity supported Sitting balance-Leahy Scale: Good     Standing balance support: Bilateral upper extremity supported Standing balance-Leahy Scale: Good                               Pertinent Vitals/Pain Pain Assessment: 0-10 Pain Score: 7  Pain Location: B feet Pain Descriptors / Indicators: Aching;Sore Pain Intervention(s): RN gave pain meds during session;Monitored during session;Limited activity within patient's tolerance    Home Living Family/patient expects to be discharged to:: Private residence Living Arrangements: Spouse/significant other Available Help at Discharge: Family;Available PRN/intermittently;Personal care attendant;Other (Comment)(CNA 5x/wk  for one hour; 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) Type of Home: Apartment Home Access: Stairs to enter Entrance Stairs-Rails: None Entrance Stairs-Number of  Steps: 2 Home Layout: One level Home Equipment: Cane - single point      Prior Function Level of Independence: Needs assistance   Gait / Transfers Assistance Needed: Mod I with amb community distances with [place], 2 falls since R foot transmetatarsal amputation this past August secondary to LOB  ADL's / Homemaking Assistance Needed: Pt mostly Ind with ADLs but has a 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 for 1 hour for help with meds and meals        Hand Dominance        Extremity/Trunk Assessment   Upper Extremity Assessment Upper Extremity Assessment: Overall WFL for tasks assessed    Lower Extremity Assessment Lower Extremity Assessment: Generalized weakness       Communication   Communication: No difficulties  Cognition Arousal/Alertness: Awake/alert Behavior During Therapy: WFL for tasks assessed/performed Overall Cognitive Status: Within Functional Limits for tasks assessed                                        General Comments      Exercises Total Joint Exercises Ankle Circles/Pumps: AROM;Both;10 reps Quad Sets: Strengthening;Both;10 reps Gluteal Sets: Strengthening;Both;10 reps Hip ABduction/ADduction: AROM;Both;5 reps Straight Leg Raises: AROM;Both;5 reps Long Arc Quad: AROM;Both;10 reps Knee Flexion: AROM;Both;10 reps Marching in Standing: AROM;Both;10 reps Other Exercises Other Exercises: HEP education for BLE APs, GS, and QS x 10 each 5-6x/day   Assessment/Plan    PT Assessment Patient needs continued PT services  PT Problem List Decreased strength;Decreased activity tolerance;Decreased balance;Decreased mobility;Decreased knowledge of use of DME       PT Treatment Interventions DME instruction;Gait training;Stair training;Functional mobility training;Neuromuscular re-education;Balance training;Therapeutic exercise;Therapeutic activities;Patient/family education    PT Goals (Current goals can be found in the Care Plan section)  Acute Rehab PT  Goals Patient Stated Goal: Improved balance PT Goal Formulation: With patient Time For Goal Achievement: 09/22/17 Potential to Achieve Goals: Good    Frequency Min 2X/week   Barriers to discharge        Co-evaluation               AM-PAC PT "6 Clicks" Daily Activity  Outcome Measure Difficulty turning over in bed (including adjusting bedclothes, sheets and blankets)?: A Little Difficulty moving from lying on back to sitting on the side of the bed? : A Little Difficulty sitting down on and standing up from a chair with arms (e.g., wheelchair, bedside commode, etc,.)?: Unable Help needed moving to and from a bed to chair (including a wheelchair)?: A Little Help needed walking in hospital room?: A Little Help needed climbing 3-5 steps with a railing? : A Little 6 Click Score: 16    End of Session Equipment Utilized During Treatment: Gait belt Activity Tolerance: Patient tolerated treatment well Patient left: in chair;with nursing/sitter in room;with chair alarm set;with call bell/phone within reach Nurse Communication: Mobility status PT Visit Diagnosis: Muscle weakness (generalized) (M62.81);Difficulty in walking, not elsewhere classified (R26.2)    Time: 8341-9622 PT Time Calculation (min) (ACUTE ONLY): 36 min   Charges:   PT Evaluation $PT Eval Low Complexity: 1 Low PT Treatments $Therapeutic Exercise: 8-22 mins   PT G Codes:   PT G-Codes **NOT FOR INPATIENT CLASS** Functional Assessment Tool Used: AM-PAC 6 Clicks Basic Mobility Functional  Limitation: Mobility: Walking and moving around Mobility: Walking and Moving Around Current Status [phone]): At least 40 percent but less than 60 percent impaired, limited or restricted Mobility: Walking and Moving Around Goal Status [phone]): At least 1 percent but less than 20 percent impaired, limited or restricted    D. Royetta Asal PT, DPT 09/09/17, 12:04 PM

## 2017-09-10 LAB — CBC WITH DIFFERENTIAL/PLATELET
Basophils Absolute: 0.1 10*3/uL (ref 0–0.1)
Basophils Relative: 1 %
Eosinophils Absolute: 0.1 10*3/uL (ref 0–0.7)
Eosinophils Relative: 1 %
HEMATOCRIT: 26.6 % — AB (ref 40.0–52.0)
Hemoglobin: 9.1 g/dL — ABNORMAL LOW (ref 13.0–18.0)
Lymphocytes Relative: 19 %
Lymphs Abs: 1.5 10*3/uL (ref 1.0–3.6)
MCH: 29.6 pg (ref 26.0–34.0)
MCHC: 34.2 g/dL (ref 32.0–36.0)
MCV: 86.7 fL (ref 80.0–100.0)
MONO ABS: 0.9 10*3/uL (ref 0.2–1.0)
Monocytes Relative: 11 %
NEUTROS ABS: 5.7 10*3/uL (ref 1.4–6.5)
NEUTROS PCT: 68 %
Platelets: 365 10*3/uL (ref 150–440)
RBC: 3.07 MIL/uL — ABNORMAL LOW (ref 4.40–5.90)
RDW: 14.8 % — ABNORMAL HIGH (ref 11.5–14.5)
WBC: 8.2 10*3/uL (ref 3.8–10.6)

## 2017-09-10 LAB — PROTIME-INR
INR: 1.03
Prothrombin Time: 13.4 seconds (ref 11.4–15.2)

## 2017-09-10 LAB — BASIC METABOLIC PANEL
ANION GAP: 8 (ref 5–15)
BUN: 12 mg/dL (ref 6–20)
CALCIUM: 8.1 mg/dL — AB (ref 8.9–10.3)
CO2: 22 mmol/L (ref 22–32)
Chloride: 105 mmol/L (ref 101–111)
Creatinine, Ser: 0.99 mg/dL (ref 0.61–1.24)
GLUCOSE: 134 mg/dL — AB (ref 65–99)
Potassium: 3.8 mmol/L (ref 3.5–5.1)
SODIUM: 135 mmol/L (ref 135–145)

## 2017-09-10 LAB — CULTURE, BLOOD (ROUTINE X 2)
CULTURE: NO GROWTH
Culture: NO GROWTH
Special Requests: ADEQUATE

## 2017-09-10 LAB — GLUCOSE, CAPILLARY
GLUCOSE-CAPILLARY: 211 mg/dL — AB (ref 65–99)
GLUCOSE-CAPILLARY: 186 mg/dL — AB (ref 65–99)
Glucose-Capillary: 236 mg/dL — ABNORMAL HIGH (ref 65–99)
Glucose-Capillary: 208 mg/dL — ABNORMAL HIGH (ref 65–99)

## 2017-09-10 LAB — TROPONIN I: Troponin I: 0.03 ng/mL (ref <0.03)

## 2017-09-10 LAB — PHOSPHORUS: Phosphorus: 2.3 mg/dL — ABNORMAL LOW (ref 2.5–4.6)

## 2017-09-10 MED ORDER — SODIUM CHLORIDE 0.9 % IV SOLN
INTRAVENOUS | Status: DC
  Administered 2017-09-11: 08:00:00 via INTRAVENOUS

## 2017-09-10 MED ORDER — GLUCERNA SHAKE PO LIQD
237.0000 mL | Freq: Two times a day (BID) | ORAL | Status: DC
  Administered 2017-09-11: 14:00:00 237 mL via ORAL

## 2017-09-10 NOTE — Progress Notes (Signed)
Nutrition Follow-up  DOCUMENTATION CODES:   Severe malnutrition in context of acute illness/injury  INTERVENTION:  Provide Glucerna Shake po BID, each supplement provides 220 kcal and 10 grams of protein. Patient prefers chocolate.  Encouraged ongoing adequate intake of calories and protein at meals.  Recommend adding on Phosphorus to morning labs to make sure WNL. Patient was determined to be at risk of refeeding syndrome on initial RD assessment 11/16.  NUTRITION DIAGNOSIS:   Severe Malnutrition related to acute illness as evidenced by severe fat depletion, moderate muscle depletion, severe muscle depletion.  Ongoing.  GOAL:   Patient will meet greater than or equal to 90% of their needs  Met.  MONITOR:   PO intake, Labs, I & O's, Supplement acceptance, Weight trends  REASON FOR ASSESSMENT:   Malnutrition Screening Tool    ASSESSMENT:   61 yo male with a PMH of Neuropathy, HTN, Diabetes Mellitus, CAD, Former Smoker, Former Drug Abuse, Chronic Leg and Back Pain, CHF, and  Borderline Resectable Pancreatic Cancer s/p Distal Pancreatectomy and Splenectomy with negative liver biopsy on 07/31/17 (managed by [facility]).   admitted for DKA, lethargy/confusion  -Patient was placed on dysphagia 3/carbohydrate modified diet with thin liquids on 11/17.   Met with patient at bedside. He reports his appetite is very good. He has been finishing most of his meals. He finished a little more than half of breakfast this morning, ate all of his lunch, and had just finished a chicken strip, biscuit, and potatoes brought in by family at time of RD visit. He denies any N/V or abdominal pain. Also denies any diarrhea. He reports he never got an oral nutrition supplement. He likes Ensure or Glucerna in chocolate flavor.  Meal Completion: 40-100% In the past 24 hours patient has had approximately 2066 kcal (100% estimated needs) and 77 grams of protein (91% minimum estimated protein needs).  When  checking on calorie and protein intake, noted CHO Modified diet had not been checked in [facility]. Patient has been ordering >100 grams of CHO at meals, which is probably contributing to elevated CBGs. Now CHO Mod diet also checked in [facility].  Medications reviewed and include: amiodarone, Novolog 0-15 units TID, Novolog 0-5 units QHS, Novolog 8 units TID, Lantus 28 units daily, pantoprazole, senna.  Labs reviewed: CBG 112-317 past 24 hrs. HgbA1c was 8.8 on 08/24/2017. Magnesium and Potassium have been WNL. Phosphorus was never checked.  No subsequent weight from admission to trend.  Diet Order:  DIET DYS 3 Room service appropriate? Yes; Fluid consistency: Thin Diet NPO time specified Diet NPO time specified  EDUCATION NEEDS:   No education needs have been identified at this time  Skin:  Skin Assessment: Skin Integrity Issues: Skin Integrity Issues:: Incisions, Other (Comment) Incisions: closed incision to abdomen Other: s/p amputation of right transmetatarsal  Last BM:  09/10/2017 - large type 1  Height:   Ht Readings from Last 1 Encounters:  09/05/17 5' 10"  (1.778 m)    Weight:   Wt Readings from Last 1 Encounters:  09/05/17 126 lb 12.2 oz (57.5 kg)    Ideal Body Weight:  75.45 kg  BMI:  Body mass index is 18.19 kg/m.  Estimated Nutritional Needs:   Kcal:  5638-7564 (MSJ x 1.3-1.5)  Protein:  85-100 grams (1.5-1.7 grams/kg)  Fluid:  1.7-2 L/day (30-35 mL/kg)  Willey Blade, MS, RD, LDN Office: [phone] Pager: [phone] After Hours/Weekend Pager: [phone]

## 2017-09-10 NOTE — Progress Notes (Addendum)
Inpatient Diabetes Program Recommendations  AACE/ADA: New Consensus Statement on Inpatient Glycemic Control (2015)  Target Ranges:  Prepandial:   less than 140 mg/dL      Peak postprandial:   less than 180 mg/dL (1-2 hours)      Critically ill patients:  140 - 180 mg/dL   Lab Results  Component Value Date   GLUCAP 208 (H) 09/10/2017   HGBA1C 8.8 (H) 08/24/2017    Review of Glycemic ControlResults for GERELL, FORTSON (MRN [id number]) as of 09/10/2017 13:09  Ref. Range 09/09/2017 11:33 09/09/2017 17:05 09/09/2017 20:47 09/10/2017 07:51 09/10/2017 11:50  Glucose-Capillary Latest Ref Range: 65 - 99 mg/dL 317 (H) 130 (H) 112 (H) 236 (H) 208 (H)   Current orders for Inpatient glycemic control:  Novolog moderate tid with meals and HS, Novolog 8 units tid with meals, Lantus 28 units daily Inpatient Diabetes Program Recommendations:  Spoke with patient regarding diabetes management.  He has been on insulin since 2009.  We discussed that insulin doses will likely change at d/c.  He is used to taking Novolog and Lantus at home. Reviewed hypoglycemia signs, symptoms and treatment- he was able to teach back and states that he feels symptomatic at 80 mg/dL.   Patient states that he prefers insulin pens instead of insulin vial and syringe for insulin delivery. He also states that he needs new glucose meter.  Discussed with MD.   Thanks, Adah Perl, RN, BC-ADM Inpatient Diabetes Coordinator Pager [phone] (8a-5p)

## 2017-09-10 NOTE — Progress Notes (Signed)
Vonda Antigua, MD [address] [place], [place], [place], [address] [address], [place], [place], [place], [address] Phone: [phone]  Fax: [phone]   Subjective:  Patient reports 1 bowel movement today, describes it to be mostly brown in color.  Patient reports that yesterday's bowel movement had melena.  Tolerating oral diet.  Denies abdominal pain, denies nausea vomiting.  Objective: Vital signs in last 24 hours: Vitals:   09/09/17 1955 09/10/17 0358 09/10/17 0903 09/10/17 1415  BP: (!) 90/52 (!) 107/56 (!) 159/83 (!) 99/59  Pulse: 72 72 77 75  Resp: 14 14    Temp: 98.8 F (37.1 C) 98.5 F (36.9 C)  99 F (37.2 C)  TempSrc: Oral Oral  Oral  SpO2: 99% 96%  100%  Weight:      Height:       Weight change:   Intake/Output Summary (Last 24 hours) at 09/10/2017 1631 Last data filed at 09/10/2017 1450 Gross per 24 hour  Intake 920 ml  Output 575 ml  Net 345 ml     Exam: Cardiac: +S1, +S2, No edema Pulm: CTA b/l, Normal Resp Effort Abd: Soft, NT/ND, No HSM Skin: Warm, no rashes Neck: Supple, Trachea midline   Lab Results: Troponin 0.03 Micro Results: Recent Results (from the past 240 hour(s))  Blood Culture (routine x 2)     Status: None   Collection Time: 09/05/17 11:49 AM  Result Value Ref Range Status   Specimen Description BLOOD RIGHT ANTECUBITAL  Final   Special Requests   Final    BOTTLES DRAWN AEROBIC AND ANAEROBIC Blood Culture adequate volume   Culture NO GROWTH 5 DAYS  Final   Report Status 09/10/2017 FINAL  Final  Urine culture     Status: None   Collection Time: 09/05/17 12:01 PM  Result Value Ref Range Status   Specimen Description URINE, RANDOM  Final   Special Requests NONE  Final   Culture   Final    NO GROWTH Performed at [facility], [address] [place], [place] [address]    Report Status 09/06/2017 FINAL  Final  Blood Culture (routine x 2)     Status: None   Collection Time: 09/05/17  4:13 PM  Result  Value Ref Range Status   Specimen Description BLOOD BLOOD RIGHT HAND  Final   Special Requests   Final    BOTTLES DRAWN AEROBIC AND ANAEROBIC Blood Culture results may not be optimal due to an inadequate volume of blood received in culture bottles   Culture NO GROWTH 5 DAYS  Final   Report Status 09/10/2017 FINAL  Final   Studies/Results: No results found. Medications:  Scheduled Meds: . amiodarone  400 mg Oral BID  . [START ON 09/11/2017] feeding supplement (GLUCERNA SHAKE)  237 mL Oral BID BM  . insulin aspart  0-15 Units Subcutaneous TID WC  . insulin aspart  0-5 Units Subcutaneous QHS  . insulin aspart  8 Units Subcutaneous TID WC  . insulin glargine  28 Units Subcutaneous Daily  . pantoprazole (PROTONIX) IV  40 mg Intravenous Q12H  . senna-docusate  2 tablet Oral BID   Continuous Infusions: PRN Meds:.acetaminophen **OR** acetaminophen, bisacodyl, morphine injection, ondansetron **OR** ondansetron (ZOFRAN) IV   Assessment: Active Problems:   DKA, type 2 ([facility]) Melanotic stools, anemia   Plan: Patient's melanotic stool and anemia is improving with PPI and holding Eliquis. His troponin is still elevated today, although decreased from before  We will need to obtain  cardiology consult (ordered - ok with Dr. Alveta Heimlich) for clearance prior to procedure.  On previous admission earlier in November patient was found to be in A. fib with RVR and Dr. Laurelyn Sickle note from that admission reports severe LV dysfunction on 08/29/17. Echo shows severe LVH. Given recent changes in medications and recent A. fib, will need to obtain cardiology clearance before proceeding with EGD.  Pt. Was started on eliquis on last admission, and would be imperative to obtain Cardiology opinion about patient's anticoagulation given his melena and anemia on this admission.    LOS: 5 days   Vonda Antigua, MD 09/10/2017, 4:31 PM

## 2017-09-10 NOTE — Progress Notes (Signed)
[facility] [place] NAME: Richard [name]    MR#:  [id number]  DATE OF BIRTH:  07-07-1956  SUBJECTIVE:  CHIEF COMPLAINT:  No chief complaint on file.  No melena today. Feels weak  REVIEW OF SYSTEMS:  CONSTITUTIONAL: No fever, positive for fatigue or weakness.  EYES: No blurred or double vision.  EARS, NOSE, AND THROAT: No tinnitus or ear pain.  RESPIRATORY: No cough, shortness of breath, wheezing or hemoptysis.  CARDIOVASCULAR: No chest pain, orthopnea, edema.  GASTROINTESTINAL: No nausea, vomiting, diarrhea or abdominal pain.  GENITOURINARY: No dysuria, hematuria.  ENDOCRINE: No polyuria, nocturia,  HEMATOLOGY: No anemia, easy bruising or bleeding SKIN: No rash or lesion. MUSCULOSKELETAL: No joint pain or arthritis.   NEUROLOGIC: No tingling, numbness, weakness.  PSYCHIATRY: No anxiety or depression.   ROS  DRUG ALLERGIES:  No Known Allergies  VITALS:  Blood pressure (!) 159/83, pulse 77, temperature 98.5 F (36.9 C), temperature source Oral, resp. rate 14, height 5\' 10"  (1.778 m), weight 57.5 kg (126 lb 12.2 oz), SpO2 96 %.  PHYSICAL EXAMINATION:  GENERAL:  61 y.o.-year-old patient lying in the bed with no acute distress.   frail EYES: Pupils equal, round, reactive to light and accommodation. No scleral icterus. Extraocular muscles intact.  HEENT: Head atraumatic, normocephalic. Oropharynx and nasopharynx very dry NECK:  Supple, no jugular venous distention. No thyroid enlargement, no tenderness.  LUNGS: Normal breath sounds bilaterally, no wheezing, rales,rhonchi or crepitation. No use of accessory muscles of respiration.  CARDIOVASCULAR: S1, S2 normal. No murmurs, rubs, or gallops.  Tachycardia ABDOMEN: Soft, nontender, nondistended. Bowel sounds present. No organomegaly or mass. Scar EXTREMITIES: No pedal edema, cyanosis, or clubbing.  Right transmetatarsal amputation stump small ulcer over the great foot area no discharge  appears dry NEUROLOGIC: Motor 5/5 PSYCHIATRIC: The patient is alert and oriented SKIN: No obvious rash, lesion, or ulcer.   Physical Exam LABORATORY PANEL:   CBC Recent Labs  Lab 09/10/17 0334  WBC 8.2  HGB 9.1*  HCT 26.6*  PLT 365   ------------------------------------------------------------------------------------------------------------------  Chemistries  Recent Labs  Lab 09/05/17 1138  09/08/17 0508  09/10/17 0334  NA 134*   < > 130*   < > 135  K 4.9   < > 3.8   < > 3.8  CL 95*   < > 102   < > 105  CO2 13*   < > 23   < > 22  GLUCOSE 582*   < > 325*   < > 134*  BUN 77*   < > 14   < > 12  CREATININE 2.01*   < > 0.90   < > 0.99  CALCIUM 8.7*   < > 7.9*   < > 8.1*  MG 2.1  --  1.8  --   --   AST 14*  --   --   --   --   ALT 11*  --   --   --   --   ALKPHOS 24*  --   --   --   --   BILITOT 1.7*  --   --   --   --    < > = values in this interval not displayed.   ------------------------------------------------------------------------------------------------------------------  Cardiac Enzymes Recent Labs  Lab 09/05/17 1138  TROPONINI 0.06*   ------------------------------------------------------------------------------------------------------------------  RADIOLOGY:  No results found.  ASSESSMENT AND PLAN:   Active Problems:   DKA, type 2 ([place])  Richard [name]  is a 61 y.o. 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  Acute blood loss anemia He does have associated severe dysphagia.  Will need to rule out esophagitis or ulcers.  Black tarry stool yesterday. On IV protonix Transfused 2 units packed RBC and HB improved to 9  Last dose  Eliquis 09/07/2017. EGD tomorrow. Discussed with Dr. Bonna Gains. Will check INR. Also check troponin and if stable can proceed with EGD  *  DKA and type 2 diabetes DKA resolved. Increased lantus . Increased sliding scale. Improving  *  Leukocytosis appears reactive in the setting of DKA Resolved  *  Pancreatic adenocarcinoma status post distal pancreatectomy and splenectomy October 2018 at [facility].  Patient denies any abdominal pain nausea or vomiting  *  Ileus with marked distention of the stomach as noted on CT of the abdomen? Having regular BMs. No abd distention or pain  *  Acute on chronic kidney disease stage III Resolved  * Afib In NSR Eliquis held  *  DVT prophylaxis SCDs  All the records are reviewed and case discussed with Care Management/Social Worker Management plans discussed with the patient, family and they are in agreement.  CODE STATUS: Full.  TOTAL CC TIME TAKING CARE OF THIS PATIENT: 35 minutes.   POSSIBLE D/C IN 1-2 DAYS, DEPENDING ON CLINICAL CONDITION.  [name] M.D on 09/10/2017   Between 7am to 6pm - Pager - [phone]  After 6pm go to [email or web address] - password [email or web address] [facility]  Office  [phone]  CC: Primary care physician; [facility], [place]  Note: This dictation was prepared with Diplomatic Services operational officer dictation along with smaller phrase technology. Any transcriptional errors that result from this process are unintentional.

## 2017-09-10 NOTE — Consult Note (Signed)
6 YOM ,Patient was seen for atrial fibrillation only 2 weeks ago when presented with Ileus now came in DKA and was asked to clear for EGD. He does not have elevated troponin and, had normal LV function and wall motion. Advise proceeding with EGD.

## 2017-09-10 NOTE — Progress Notes (Signed)
PT Cancellation Note  Patient Details Name: Richard [name] MRN: [id number] DOB: 11/01/1955   Cancelled Treatment:    Reason Eval/Treat Not Completed: Patient declined, no reason specified Pt reports he has felt poorly today, mostly sick to his stomach and weak.  He pleasantly declined PT until he is feeling better.  PT session deferred today.  Kreg Shropshire, DPT 09/10/2017, 5:00 PM

## 2017-09-11 ENCOUNTER — Encounter
Admission: EM | Disposition: A | Discharge status: Home / Self Care | Payer: Self-pay | Source: Home / Self Care | Attending: Internal Medicine

## 2017-09-11 ENCOUNTER — Inpatient Hospital Stay: Payer: Medicare Other | Admitting: Anesthesiology

## 2017-09-11 DIAGNOSIS — K209 Esophagitis, unspecified: Secondary | ICD-10-CM

## 2017-09-11 HISTORY — PX: ESOPHAGOGASTRODUODENOSCOPY: SHX5428

## 2017-09-11 LAB — BASIC METABOLIC PANEL
Anion gap: 3 — ABNORMAL LOW (ref 5–15)
BUN: 14 mg/dL (ref 6–20)
CO2: 25 mmol/L (ref 22–32)
CREATININE: 0.97 mg/dL (ref 0.61–1.24)
Calcium: 8.1 mg/dL — ABNORMAL LOW (ref 8.9–10.3)
Chloride: 106 mmol/L (ref 101–111)
GFR calc Af Amer: >60 mL/min (ref >60)
GLUCOSE: 235 mg/dL — AB (ref 65–99)
Potassium: 3.7 mmol/L (ref 3.5–5.1)
Sodium: 134 mmol/L — ABNORMAL LOW (ref 135–145)

## 2017-09-11 LAB — GLUCOSE, CAPILLARY
GLUCOSE-CAPILLARY: 127 mg/dL — AB (ref 65–99)
Glucose-Capillary: 205 mg/dL — ABNORMAL HIGH (ref 65–99)
Glucose-Capillary: 62 mg/dL — ABNORMAL LOW (ref 65–99)
Glucose-Capillary: 107 mg/dL — ABNORMAL HIGH (ref 65–99)

## 2017-09-11 SURGERY — EGD (ESOPHAGOGASTRODUODENOSCOPY)
Anesthesia: General

## 2017-09-11 MED ORDER — BASAGLAR KWIKPEN 100 UNIT/ML ~~LOC~~ SOPN: 28.0000 [IU] | PEN_INJECTOR | Freq: Every day | SUBCUTANEOUS | 0 refills | Status: DC

## 2017-09-11 MED ORDER — PROPOFOL 10 MG/ML IV BOLUS
INTRAVENOUS | Status: AC
  Filled 2017-09-11: qty 20

## 2017-09-11 MED ORDER — INSULIN LISPRO 100 UNIT/ML (KWIKPEN): 4.0000 [IU] | PEN_INJECTOR | Freq: Three times a day (TID) | SUBCUTANEOUS | 0 refills | Status: DC

## 2017-09-11 MED ORDER — FENTANYL CITRATE (PF) 100 MCG/2ML IJ SOLN
INTRAMUSCULAR | Status: AC
  Filled 2017-09-11: qty 2

## 2017-09-11 MED ORDER — FERROUS SULFATE 325 (65 FE) MG PO TBEC: 325.0000 mg | DELAYED_RELEASE_TABLET | Freq: Two times a day (BID) | ORAL | 0 refills | Status: DC

## 2017-09-11 MED ORDER — PROPOFOL 500 MG/50ML IV EMUL
INTRAVENOUS | Status: DC | PRN
  Administered 2017-09-11: 120 ug/kg/min via INTRAVENOUS

## 2017-09-11 MED ORDER — DEXTROSE-NACL 5-0.9 % IV SOLN
INTRAVENOUS | Status: DC
  Administered 2017-09-11: 11:00:00 via INTRAVENOUS

## 2017-09-11 MED ORDER — FENTANYL CITRATE (PF) 100 MCG/2ML IJ SOLN
INTRAMUSCULAR | Status: DC | PRN
  Administered 2017-09-11 (×2): 50 ug via INTRAVENOUS

## 2017-09-11 MED ORDER — GLOBAL EASE INJECT PEN NEEDLES 31G X 5 MM MISC: 1.0000 [IU] | Freq: Every day | 0 refills | Status: DC

## 2017-09-11 MED ORDER — DEXTROSE 50 % IV SOLN
25.0000 mL | Freq: Once | INTRAVENOUS | Status: AC
  Administered 2017-09-11: 11:00:00 25 mL via INTRAVENOUS

## 2017-09-11 MED ORDER — PROPOFOL 10 MG/ML IV BOLUS
INTRAVENOUS | Status: DC | PRN
  Administered 2017-09-11: 30 mg via INTRAVENOUS

## 2017-09-11 MED ORDER — K PHOS MONO-SOD PHOS DI & MONO 155-852-130 MG PO TABS
500.0000 mg | ORAL_TABLET | ORAL | Status: DC
  Filled 2017-09-11 (×2): qty 2

## 2017-09-11 MED ORDER — AMIODARONE HCL 400 MG PO TABS: 200.0000 mg | ORAL_TABLET | Freq: Two times a day (BID) | ORAL | 0 refills | Status: DC

## 2017-09-11 MED ORDER — DEXTROSE 50 % IV SOLN
INTRAVENOUS | Status: AC
  Administered 2017-09-11: 11:00:00 25 mL via INTRAVENOUS
  Filled 2017-09-11: qty 50

## 2017-09-11 MED ORDER — PANTOPRAZOLE SODIUM 40 MG PO TBEC: 40.0000 mg | DELAYED_RELEASE_TABLET | Freq: Two times a day (BID) | ORAL | 0 refills | Status: DC

## 2017-09-11 NOTE — Progress Notes (Signed)
SUBJECTIVE: Pt is feeling well, no chest pain resting comfortably.    Vitals:   09/10/17 0903 09/10/17 1415 09/10/17 2111 09/11/17 0424  BP: (!) 159/83 (!) 99/59 118/61 (!) 144/77  Pulse: 77 75 75 75  Resp:   18 18  Temp:  99 F (37.2 C) 98.7 F (37.1 C) 98.6 F (37 C)  TempSrc:  Oral    SpO2:  100% 100% 100%  Weight:      Height:        Intake/Output Summary (Last 24 hours) at 09/11/2017 0849 Last data filed at 09/11/2017 0733 Gross per 24 hour  Intake 920 ml  Output 1675 ml  Net -755 ml    LABS: Basic Metabolic Panel: Recent Labs    09/10/17 0334 09/10/17 1232 09/11/17 0435  NA 135  --  134*  K 3.8  --  3.7  CL 105  --  106  CO2 22  --  25  GLUCOSE 134*  --  235*  BUN 12  --  14  CREATININE 0.99  --  0.97  CALCIUM 8.1*  --  8.1*  PHOS  --  2.3*  --    Liver Function Tests: No results for input(s): AST, ALT, ALKPHOS, BILITOT, PROT, ALBUMIN in the last 72 hours. No results for input(s): LIPASE, AMYLASE in the last 72 hours. CBC: Recent Labs    09/09/17 0718 09/10/17 0334  WBC 10.1 8.2  NEUTROABS 8.1* 5.7  HGB 9.5* 9.1*  HCT 28.5* 26.6*  MCV 87.2 86.7  PLT 358 365   Cardiac Enzymes: Recent Labs    09/10/17 1242  TROPONINI 0.03*   BNP: Invalid input(s): POCBNP D-Dimer: No results for input(s): DDIMER in the last 72 hours. Hemoglobin A1C: No results for input(s): HGBA1C in the last 72 hours. Fasting Lipid Panel: No results for input(s): CHOL, HDL, LDLCALC, TRIG, CHOLHDL, LDLDIRECT in the last 72 hours. Thyroid Function Tests: No results for input(s): TSH, T4TOTAL, T3FREE, THYROIDAB in the last 72 hours.  Invalid input(s): FREET3 Anemia Panel: No results for input(s): VITAMINB12, FOLATE, FERRITIN, TIBC, IRON, RETICCTPCT in the last 72 hours.   PHYSICAL EXAM General: Sleepy/lethargic. Nodding to questions.  HEENT:  Normocephalic and atramatic Neck:  No JVD.  Lungs: Clear bilaterally to auscultation and percussion. Heart: HRRR . Normal S1  and S2 without gallops or murmurs.  Abdomen: Bowel sounds are positive, abdomen soft and non-tender  Msk:  Back normal, normal gait. Normal strength and tone for age. Extremities: No clubbing, cyanosis or edema.   Neuro: Lethargic, difficult to rouse   Telemetry: normal sinus rhythm 77bpm  ASSESSMENT AND PLAN: History of atrial fibrillation and is unable to continue anticoagulation due to melena and anemia. May proceed with EDG as is stable from cardiac perspective with normal LVEF and sinus rhythm.   Active Problems:   DKA, type 2 ([place])    Jake Bathe, NP-C 09/11/2017 8:49 AM

## 2017-09-11 NOTE — Progress Notes (Signed)
Pharmacy Electrolyte Monitoring Consult:  Pharmacy consulted to assist in monitoring and replacing electrolytes in this 61 y.o. male admitted on 09/05/2017 with elevated blood glucose. Patient started on insulin drip for DKA is at risk for hypokalemia.  Labs: Sodium (mmol/L)  Date Value  09/11/2017 134 (L)   Potassium (mmol/L)  Date Value  09/11/2017 3.7   Magnesium (mg/dL)  Date Value  09/08/2017 1.8   Phosphorus (mg/dL)  Date Value  09/10/2017 2.3 (L)   Calcium (mg/dL)  Date Value  09/11/2017 8.1 (L)   Albumin (g/dL)  Date Value  09/05/2017 3.1 (L)    Plan: Patient being transitioned off of insulin drip. Patient has received additional potassium 81mEq IV Q1hr x 4 doses.   11/16 1813 K: 3.4. Will order KCL 71mEq x 1 dose PO.  WIll recheck electrolytes with AM labs.   11/17  K 4.1 Patient now on Lantus/ SSI. No supplementation at this time. Will f/u with am labs  11/18  K 3.8. No supplementation at this time. Will f/u with am labs  11/21 Phos 2.3, will give potassium phosphate neutral tab 2 tabs po q4h x 2 doses. Other electrolytes WNL. Recheck BMP and phos tomorrow morning.   Pharmacy will continue to monitor and adjust per consult.   Thomasenia Sales, PharmD, BCPS Clinical Pharmacist 09/11/2017 1:06 PM

## 2017-09-11 NOTE — Op Note (Addendum)
[facility] Gastroenterology Patient Name: Richard [name] Procedure Date: 09/11/2017 12:20 PM MRN: [id number] Account #: [id number] Date of Birth: Aug 07, 1956 Admit Type: Inpatient Age: 61 Room: [facility] ENDO ROOM 1 Gender: Male Note Status: Finalized Procedure:            Upper GI endoscopy Indications:          Acute post hemorrhagic anemia, Melena Providers:            Laira Penninger B. Bonna Gains MD, MD Medicines:            Monitored Anesthesia Care Complications:        No immediate complications. Procedure:            Pre-Anesthesia Assessment:                       - ASA Grade Assessment: III - A patient with severe                        systemic disease.                       - The risks and benefits of the procedure and the                        sedation options and risks were discussed with the                        patient. All questions were answered and informed                        consent was obtained.                       After obtaining informed consent, the endoscope was                        passed under direct vision. Throughout the procedure,                        the patient's blood pressure, pulse, and oxygen                        saturations were monitored continuously. The                        Colonoscope was introduced through the mouth, and                        advanced to the second part of duodenum. The upper GI                        endoscopy was accomplished with ease. The patient                        tolerated the procedure well. Findings:      LA Grade D (one or more mucosal breaks involving at least 75% of       esophageal circumference) esophagitis with no bleeding was found in the       entire esophagus.      The entire examined stomach was normal.      The examined duodenum  was normal.      No residual food was present. No signs of gastric outlet obstruction.      No old or new blood present. Impression:           - LA  Grade D esophagitis. This is present from the                        distal to the proximal esophagus. This is the source of                        patient's GI bleeding in the setting of Eliquis use.                       - Normal stomach.                       - Normal examined duodenum.                       - No specimens collected. Recommendation:       - Return patient to hospital ward for ongoing care.                       - Full liquid diet for 7 days. Then change to soft diet                        for 3 weeks then regular diet                       - Use Protonix (pantoprazole) 40 mg PO BID.                       - Follow an antireflux regimen.                       - Keep head of bed elevated to 30 degrees throughout                        the day and at bedtime.                       Small frequent meals                       Avoid eating 3 hrs before bedtime                       Stay upright as much as possible                       - Repeat upper endoscopy in 3 months to check healing.                       - Risks and benefits of restarting anticoagulation will                        need to be weighed carefully. He has Grade D                        esophagitis which is the source of his bleeding and the  bleeding after holding Eliquis. If medication is                        restarted, there is a risk that this area will rebleed                        until it heals longterm. Would ask Cardiology for                        opinion on restarting anticoagulation, and if another                        agent might be more beneficial than Eliquis in this                        patient. Procedure Code(s):    --- Professional ---                       [phone], Esophagogastroduodenoscopy, flexible, transoral;                        diagnostic, including collection of specimen(s) by                        brushing or washing, when performed (separate  procedure) Diagnosis Code(s):    --- Professional ---                       K20.9, Esophagitis, unspecified                       D62, Acute posthemorrhagic anemia                       K92.1, Melena (includes Hematochezia) CPT copyright 2016 American Medical Association. All rights reserved. The codes documented in this report are preliminary and upon coder review may  be revised to meet current compliance requirements.  Vonda Antigua, MD Margretta Sidle B. Bonna Gains MD, MD 09/11/2017 12:44:22 PM This report has been signed electronically. Number of Addenda: 0 Note Initiated On: 09/11/2017 12:20 PM      [facility]

## 2017-09-11 NOTE — Transfer of Care (Signed)
Immediate Anesthesia Transfer of Care Note  Patient: Richard [name]  Procedure(s) Performed: ESOPHAGOGASTRODUODENOSCOPY (EGD) (N/A )  Patient Location: PACU  Anesthesia Type:General  Level of Consciousness: awake  Airway & Oxygen Therapy: Patient Spontanous Breathing and Patient connected to nasal cannula oxygen  Post-op Assessment: Report given to RN and Post -op Vital signs reviewed and stable  Post vital signs: Reviewed  Last Vitals:  Vitals:   09/10/17 2111 09/11/17 0424  BP: 118/61 (!) 144/77  Pulse: 75 75  Resp: 18 18  Temp: 37.1 C 37 C  SpO2: 100% 100%    Last Pain:  Vitals:   09/11/17 0731  TempSrc:   PainSc: 0-No pain      Patients Stated Pain Goal: 3 (53/91/22 5834)  Complications: No apparent anesthesia complications

## 2017-09-11 NOTE — Progress Notes (Signed)
PT Cancellation Note:  Pt currently off floor for procedure.  Will re-attempt PT treatment session at a later date/time.  Leitha Bleak, PT 09/11/17, 12:31 PM [phone]

## 2017-09-11 NOTE — Care Management (Signed)
Discharge to home today per Dr. Darvin Neighbours. Will request resumption of services per [facility].  Rolling walker for home per [place]. [facility] will transport Shelbie Ammons RN MSN [place] Management [phone]

## 2017-09-11 NOTE — Progress Notes (Signed)
Patient discharged home per MD order. Prescriptions given to patient. All discharge instructions given and all questions answered. 

## 2017-09-11 NOTE — Anesthesia Postprocedure Evaluation (Signed)
Anesthesia Post Note  Patient: Richard [name]  Procedure(s) Performed: ESOPHAGOGASTRODUODENOSCOPY (EGD) (N/A )  Patient location during evaluation: PACU Anesthesia Type: General Level of consciousness: awake Pain management: pain level controlled Vital Signs Assessment: post-procedure vital signs reviewed and stable Respiratory status: spontaneous breathing Cardiovascular status: stable Anesthetic complications: no     Last Vitals:  Vitals:   09/11/17 1300 09/11/17 1310  BP: 116/69 130/74  Pulse: 60 61  Resp: 14 11  Temp:    SpO2: 100% 100%    Last Pain:  Vitals:   09/11/17 1239  TempSrc: Axillary  PainSc:                  VAN STAVEREN,Kenlynn Houde

## 2017-09-11 NOTE — Progress Notes (Signed)
[place] at [place] NAME: Richard [name]    MR#:  [id number]  DATE OF BIRTH:  1956-09-02  SUBJECTIVE:  CHIEF COMPLAINT:  No chief complaint on file.  No further melena. Feels weak and lightheaded. Accu check showed glucose 62  REVIEW OF SYSTEMS:  CONSTITUTIONAL: No fever, positive for fatigue or weakness.  EYES: No blurred or double vision.  EARS, NOSE, AND THROAT: No tinnitus or ear pain.  RESPIRATORY: No cough, shortness of breath, wheezing or hemoptysis.  CARDIOVASCULAR: No chest pain, orthopnea, edema.  GASTROINTESTINAL: No nausea, vomiting, diarrhea or abdominal pain.  GENITOURINARY: No dysuria, hematuria.  ENDOCRINE: No polyuria, nocturia,  HEMATOLOGY: No anemia, easy bruising or bleeding SKIN: No rash or lesion. MUSCULOSKELETAL: No joint pain or arthritis.   NEUROLOGIC: No tingling, numbness, weakness.  PSYCHIATRY: No anxiety or depression.   ROS  DRUG ALLERGIES:  No Known Allergies  VITALS:  Blood pressure (!) 144/77, pulse 75, temperature 98.6 F (37 C), resp. rate 18, height 5\' 10"  (1.778 m), weight 57.5 kg (126 lb 12.2 oz), SpO2 100 %.  PHYSICAL EXAMINATION:  GENERAL:  61 y.o.-year-old patient lying in the bed with no acute distress.   frail EYES: Pupils equal, round, reactive to light and accommodation. No scleral icterus. Extraocular muscles intact.  HEENT: Head atraumatic, normocephalic. Oropharynx and nasopharynx very dry NECK:  Supple, no jugular venous distention. No thyroid enlargement, no tenderness.  LUNGS: Normal breath sounds bilaterally, no wheezing, rales,rhonchi or crepitation. No use of accessory muscles of respiration.  CARDIOVASCULAR: S1, S2 normal. No murmurs, rubs, or gallops.  Tachycardia ABDOMEN: Soft, nontender, nondistended. Bowel sounds present. No organomegaly or mass. Scar EXTREMITIES: No pedal edema, cyanosis, or clubbing.  Right transmetatarsal amputation stump small ulcer over the great foot  area no discharge appears dry NEUROLOGIC: Motor 5/5 PSYCHIATRIC: The patient is alert and oriented SKIN: No obvious rash, lesion, or ulcer.   Physical Exam LABORATORY PANEL:   CBC Recent Labs  Lab 09/10/17 0334  WBC 8.2  HGB 9.1*  HCT 26.6*  PLT 365   ------------------------------------------------------------------------------------------------------------------  Chemistries  Recent Labs  Lab 09/05/17 1138  09/08/17 0508  09/11/17 0435  NA 134*   < > 130*   < > 134*  K 4.9   < > 3.8   < > 3.7  CL 95*   < > 102   < > 106  CO2 13*   < > 23   < > 25  GLUCOSE 582*   < > 325*   < > 235*  BUN 77*   < > 14   < > 14  CREATININE 2.01*   < > 0.90   < > 0.97  CALCIUM 8.7*   < > 7.9*   < > 8.1*  MG 2.1  --  1.8  --   --   AST 14*  --   --   --   --   ALT 11*  --   --   --   --   ALKPHOS 24*  --   --   --   --   BILITOT 1.7*  --   --   --   --    < > = values in this interval not displayed.   ------------------------------------------------------------------------------------------------------------------  Cardiac Enzymes Recent Labs  Lab 09/05/17 1138 09/10/17 1242  TROPONINI 0.06* 0.03*   ------------------------------------------------------------------------------------------------------------------  RADIOLOGY:  No results found.  ASSESSMENT AND PLAN:   Active Problems:  DKA, type 2 ([place])  Merdith Shankman  is a 61 y.o. 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  *Acute on chronic anemia.  Acute blood loss anemia He does have associated severe dysphagia.  Will need to rule out  esophagitis or ulcers.  Black tarry stool 2 episodes. On IV protonix Transfused 2 units packed RBC and HB improved to 9  Last dose Eliquis 09/07/2017. EGD today. Discussed with Dr. Bonna Gains.  *  DKA and type 2 diabetes DKA resolved. On lantus and sliding scale  *  Leukocytosis appears reactive in the setting of DKA Resolved  *  Pancreatic adenocarcinoma status post distal pancreatectomy and splenectomy October 2018 at [facility].  Patient denies any abdominal pain nausea or vomiting  *  Ileus with marked distention of the stomach as noted on CT of the abdomen? Having regular BMs. No abd distention or pain  *  Acute on chronic kidney disease stage III Resolved  * Afib In NSR Eliquis held. Will hold anti coagulation atleast for a week till he follows with cardiology due to gi bleed  *  DVT prophylaxis SCDs  All the records are reviewed and case discussed with Care Management/Social Worker Management plans discussed with the patient, family and they are in agreement.  CODE STATUS: Full.  TOTAL TIME TAKING CARE OF THIS PATIENT: 35 minutes.   POSSIBLE D/C IN 1-2 DAYS, DEPENDING ON CLINICAL CONDITION.  [name] M.D on 09/11/2017   Between 7am to 6pm - Pager - [phone]  After 6pm go to [email or web address] - password [email or web address] [facility]  Office  [phone]  CC: Primary care physician; [facility], [place]  Note: This dictation was prepared with Diplomatic Services operational officer dictation along with smaller phrase technology. Any transcriptional errors that result from this process are unintentional.

## 2017-09-11 NOTE — Anesthesia Post-op Follow-up Note (Signed)
Anesthesia QCDR form completed.        

## 2017-09-11 NOTE — Progress Notes (Signed)
Inpatient Diabetes Program Recommendations  AACE/ADA: New Consensus Statement on Inpatient Glycemic Control (2015)  Target Ranges:  Prepandial:   less than 140 mg/dL      Peak postprandial:   less than 180 mg/dL (1-2 hours)      Critically ill patients:  140 - 180 mg/dL   Lab Results  Component Value Date   GLUCAP 205 (H) 09/11/2017   HGBA1C 8.8 (H) 08/24/2017    Review of Glycemic ControlResults for Richard [name], Richard [name] (MRN [id number]) as of 09/11/2017 09:36  Ref. Range 09/10/2017 07:51 09/10/2017 11:50 09/10/2017 17:05 09/10/2017 21:10 09/11/2017 07:28  Glucose-Capillary Latest Ref Range: 65 - 99 mg/dL 236 (H) 208 (H) 211 (H) 186 (H) 205 (H)    Diabetes history: Type 2 DM Current orders for Inpatient glycemic control:  Novolog moderate tid with meals and HS, Novolog 8 units tid with meals, Lantus 28 units daily  Inpatient Diabetes Program Recommendations: Please consider increasing Lantus to 32 units daily.   Thanks, Adah Perl, RN, BC-ADM Inpatient Diabetes Coordinator Pager [phone] (8a-5p)

## 2017-09-11 NOTE — Anesthesia Preprocedure Evaluation (Signed)
Anesthesia Evaluation  Patient identified by MRN, date of birth, ID band Patient awake    Reviewed: Allergy & Precautions, NPO status , Patient's Chart, lab work & pertinent test results  Airway Mallampati: II       Dental  (+) Poor Dentition   Pulmonary COPD, former smoker,     + decreased breath sounds      Cardiovascular Exercise Tolerance: Poor hypertension, Pt. on medications + CAD and +CHF  + dysrhythmias Atrial Fibrillation  Rhythm:Regular     Neuro/Psych negative neurological ROS     GI/Hepatic Neg liver ROS, Pancreatic ca   Endo/Other  diabetes, Poorly Controlled, Type 1, Insulin Dependent  Renal/GU      Musculoskeletal   Abdominal Normal abdominal exam  (+)   Peds negative pediatric ROS (+)  Hematology negative hematology ROS (+)   Anesthesia Other Findings   Reproductive/Obstetrics                             Anesthesia Physical Anesthesia Plan  ASA: III  Anesthesia Plan: General   Post-op Pain Management:    Induction: Intravenous  PONV Risk Score and Plan: 0  Airway Management Planned: Natural Airway and Nasal Cannula  Additional Equipment:   Intra-op Plan:   Post-operative Plan:   Informed Consent: I have reviewed the patients History and Physical, chart, labs and discussed the procedure including the risks, benefits and alternatives for the proposed anesthesia with the patient or authorized representative who has indicated his/her understanding and acceptance.     Plan Discussed with: Surgeon  Anesthesia Plan Comments:         Anesthesia Quick Evaluation

## 2017-09-11 NOTE — Discharge Instructions (Signed)
Liquid diet for 5 days and then advance to regular diabetic diet.

## 2017-09-14 LAB — BLOOD GAS, VENOUS
ACID-BASE DEFICIT: 14.5 mmol/L — AB (ref 0.0–2.0)
BICARBONATE: 12.1 mmol/L — AB (ref 20.0–28.0)
Patient temperature: 37
pCO2, Ven: 31 mmHg — ABNORMAL LOW (ref 44.0–60.0)
pH, Ven: 7.2 — ABNORMAL LOW (ref 7.250–7.430)

## 2017-09-15 NOTE — Discharge Summary (Signed)
[facility] NAME: Richard [name]    MR#:  [id number]  DATE OF BIRTH:  10/13/1956  DATE OF ADMISSION:  09/05/2017 ADMITTING PHYSICIAN: Fritzi Mandes, MD  DATE OF DISCHARGE: 09/11/2017  4:20 PM  PRIMARY CARE PHYSICIAN: [facility] [place]   ADMISSION DIAGNOSIS:  Sepsis, due to unspecified organism [facility]) [A41.9] Diabetic ketoacidosis without coma associated with diabetes mellitus due to underlying condition ([place]) [E08.10]  DISCHARGE DIAGNOSIS:  Active Problems:   DKA, type 2 ([place])   SECONDARY DIAGNOSIS:   Past Medical History:  Diagnosis Date  . Atrial fibrillation ([place])    per patient  . CHF (congestive heart failure) ([place])   . Chronic back pain   . Chronic leg pain   . Coronary artery disease   . Diabetes mellitus without complication ([place])   . Hypertension   . Neuropathy      ADMITTING HISTORY  HISTORY OF PRESENT ILLNESS:  Richard [name]  is a 61 y.o. 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  Patient not able to give much history or review of system he is very dehydrated appears lethargic and sleepy.  No family in the ER. According to the RN patient's wife told sugars have been out of control.  Patient has been having issues obtaining insulin through the pharmacy and given a run around for his Medicaid coverage for insulin.  Not sure if patient is taking insulin appropriately. Patient was found to be in DKA.  He started on IV fluids.  IV insulin drip has been initiated. White count was 20,000.  IV Vanco and Zosyn empirically were given.  No source of infection so far  identified.     HOSPITAL COURSE:   Richard [name] a61 y.o.malewith a known history of recent diagnosis of adenocarcinoma of the pancreas status post pancreatectomy(tail part)and splenectomy in October 2018 at [name]., Type 2 diabetes on insulin, coronary artery disease, history of diabetic foot ulcer with right foot transmetatarsal amputation in August 2018 comes to the emergency room with DKA  * Hypoglycemia due to being NPO D50 given and resolved once patient back on his diet.  *Acute on chronic anemia.  Acute blood loss anemia He does have associated severe dysphagia.  Will need to rule out esophagitis or ulcers.  Black tarry stool 2 episodes. Was on IV Protonix and changed to oral Protonix at discharge.  Discontinued Eliquis and aspirin he will follow-up with cardiology and GI. Transfused 2 units packed RBC and HB improved to 9  Last dose Eliquis 09/07/2017. EGD showed esophagitis and no active bleeding. Discussed with Dr. Bonna Gains.  * DKA and type 2 diabetes DKA resolved. On lantus and sliding scale  * Leukocytosis appears reactive in the setting of DKA Resolved  * Pancreatic adenocarcinoma status post distal pancreatectomy and splenectomy October 2018 at [facility]. Patient denies any abdominal pain nausea or vomiting  * Ileus with marked distention of the stomach as noted on CT of the abdomen? Having regular BMs. No abd distention or pain No obstruction found on EGD  * Acute on chronic kidney disease stage III Resolved  * Afib In NSR Eliquis held. Will hold anti coagulation atleast  for a week till he follows with cardiology due to gi bleed  Patient stable for discharge back home to follow-up with cardiology and GI.    CONSULTS OBTAINED:  Treatment Team:  Virgel Manifold, MD Dionisio David, MD  DRUG ALLERGIES:  No Known Allergies  DISCHARGE MEDICATIONS:   Discharge Medication List as of 09/11/2017  2:42 PM    START taking  these medications   Details  ferrous sulfate 325 (65 FE) MG EC tablet Take 1 tablet (325 mg total) by mouth 2 (two) times daily., Starting Wed 09/11/2017, Until Thu 09/11/2018, Print    insulin lispro (HUMALOG KWIKPEN) 100 UNIT/ML KiwkPen Inject 0.04 mLs (4 Units total) into the skin 3 (three) times daily with meals., Starting Wed 09/11/2017, Print      CONTINUE these medications which have CHANGED   Details  amiodarone (PACERONE) 400 MG tablet Take 0.5 tablets (200 mg total) by mouth 2 (two) times daily., Starting Wed 09/11/2017, Print    GLOBAL EASE INJECT PEN NEEDLES 31G X 5 MM MISC Inject 1 Units into the skin daily., Starting Wed 09/11/2017, Print    Insulin Glargine (BASAGLAR KWIKPEN) 100 UNIT/ML SOPN Inject 0.28 mLs (28 Units total) into the skin at bedtime., Starting Wed 09/11/2017, Print    pantoprazole (PROTONIX) 40 MG tablet Take 1 tablet (40 mg total) by mouth 2 (two) times daily., Starting Wed 09/11/2017, Print      CONTINUE these medications which have NOT CHANGED   Details  atorvastatin (LIPITOR) 40 MG tablet Take 40 mg by mouth every evening., Historical Med    gabapentin (NEURONTIN) 800 MG tablet Take 800 mg by mouth 3 (three) times daily., Historical Med    metoCLOPramide (REGLAN) 10 MG tablet Take 10 mg by mouth 3 (three) times daily as needed for nausea., Historical Med    Needles & Syringes MISC Use as directed, Print    ondansetron (ZOFRAN) 4 MG tablet Take 4 mg by mouth every 8 (eight) hours as needed for nausea or vomiting., Historical Med      STOP taking these medications     insulin NPH Human (HUMULIN N,NOVOLIN N) 100 UNIT/ML injection      metoprolol tartrate (LOPRESSOR) 25 MG tablet      apixaban (ELIQUIS) 5 MG TABS tablet      aspirin EC 81 MG tablet      insulin aspart (NOVOLOG) 100 UNIT/ML injection         Today   VITAL SIGNS:  Blood pressure 130/74, pulse 61, temperature (!) 97.4 F (36.3 C), temperature source Axillary, resp. rate  11, height 5\' 10"  (1.778 m), weight 57.5 kg (126 lb 12.2 oz), SpO2 100 %.  I/O:  No intake or output data in the 24 hours ending 09/15/17 0848  PHYSICAL EXAMINATION:  Physical Exam  GENERAL:  60 y.o.-year-old patient lying in the bed with no acute distress.  LUNGS: Normal breath sounds bilaterally, no wheezing, rales,rhonchi or crepitation. No use of accessory muscles of respiration.  CARDIOVASCULAR: S1, S2 normal. No murmurs, rubs, or gallops.  ABDOMEN: Soft, non-tender, non-distended. Bowel sounds present. No organomegaly or mass.  NEUROLOGIC: Moves all 4 extremities. PSYCHIATRIC: The patient is alert and oriented x 3.  SKIN: No obvious rash, lesion, or ulcer.   DATA REVIEW:   CBC Recent Labs  Lab 09/10/17 0334  WBC 8.2  HGB 9.1*  HCT 26.6*  PLT 365    Chemistries  Recent Labs  Lab 09/11/17 0435  NA 134*  K 3.7  CL 106  CO2 25  GLUCOSE 235*  BUN 14  CREATININE 0.97  CALCIUM 8.1*    Cardiac Enzymes Recent Labs  Lab 09/10/17 1242  TROPONINI 0.03*    Microbiology Results  Results for orders placed or performed during the hospital encounter of 09/05/17  Blood Culture (routine x 2)     Status: None   Collection Time: 09/05/17 11:49 AM  Result Value Ref Range Status   Specimen Description BLOOD RIGHT ANTECUBITAL  Final   Special Requests   Final    BOTTLES DRAWN AEROBIC AND ANAEROBIC Blood Culture adequate volume   Culture NO GROWTH 5 DAYS  Final   Report Status 09/10/2017 FINAL  Final  Urine culture     Status: None   Collection Time: 09/05/17 12:01 PM  Result Value Ref Range Status   Specimen Description URINE, RANDOM  Final   Special Requests NONE  Final   Culture   Final    NO GROWTH Performed at [facility], [place] [address] [place],  [address]    Report Status 09/06/2017 FINAL  Final  Blood Culture (routine x 2)     Status: None   Collection Time: 09/05/17  4:13 PM  Result Value Ref Range Status   Specimen Description BLOOD BLOOD  RIGHT HAND  Final   Special Requests   Final    BOTTLES DRAWN AEROBIC AND ANAEROBIC Blood Culture results may not be optimal due to an inadequate volume of blood received in culture bottles   Culture NO GROWTH 5 DAYS  Final   Report Status 09/10/2017 FINAL  Final    RADIOLOGY:  No results found.  Follow up with PCP in 1 week.  Management plans discussed with the patient, family and they are in agreement.  CODE STATUS:  Code Status History    Date Active Date Inactive Code Status Order ID Comments User Context   09/05/2017 15:59 09/11/2017 19:28 Full Code [id number]  Fritzi Mandes, MD Inpatient   08/24/2017 02:38 08/29/2017 21:31 Full Code [id number]  Lance Coon, MD Inpatient   08/10/2015 08:22 08/11/2015 19:47 Full Code [id number]  Harrie Foreman, MD Inpatient      TOTAL TIME TAKING CARE OF THIS PATIENT ON DAY OF DISCHARGE: more than 30 minutes.   [name] M.D on 09/15/2017 at 8:48 AM  Between 7am to 6pm - Pager - [phone]  After 6pm go to [email or web address] - password [email or web address]  Office  [phone]  CC: Primary care physician; [facility] [place]  Note: This dictation was prepared with Diplomatic Services operational officer dictation along with smaller phrase technology. Any transcriptional errors that result from this process are unintentional.

## 2017-09-16 ENCOUNTER — Encounter: Payer: Self-pay | Admitting: Gastroenterology

## 2017-09-17 ENCOUNTER — Encounter: Payer: Self-pay | Admitting: Gastroenterology

## 2017-09-25 ENCOUNTER — Ambulatory Visit (INDEPENDENT_AMBULATORY_CARE_PROVIDER_SITE_OTHER): Payer: Medicare Other | Admitting: Gastroenterology

## 2017-09-25 ENCOUNTER — Encounter: Payer: Self-pay | Admitting: Gastroenterology

## 2017-09-25 VITALS — BP 119/69 | HR 76 | Temp 98.6°F | Ht 71.0 in | Wt 149.2 lb

## 2017-09-25 DIAGNOSIS — R531 Weakness: Secondary | ICD-10-CM

## 2017-09-25 DIAGNOSIS — R1310 Dysphagia, unspecified: Secondary | ICD-10-CM | POA: Diagnosis not present

## 2017-09-25 NOTE — Progress Notes (Signed)
Vonda Antigua, MD [address]  [place]  [place], [place] [address]  Main: [phone]  Fax: [phone]   Primary Care Physician: [facility] [place]  Primary Gastroenterologist:  Dr. Vonda Antigua  Chief Complaint  Patient presents with  . hospital follow up EGD  . Weakness and Tiredness  . Dysphagia    HPI: Richard [name] is a 61 y.o. 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  Patient was started on Eliquis about 2 months ago for A. fib by Dr. Humphrey Rolls from cardiology.  Eliquis was held and CT findings of esophageal thickening and stomach distention were noted. subsequent EGD showed grade D esophagitis as source of his anemia and he was discharged with PPI twice daily.  Recommendations were to repeat EGD in 3 months.  "Review of outside records show the patient had an EGD on August 28 which revealed normal esophagus, gastritis and normal duodenum.  EUS revealed mass in the pancreatic body with pancreatic duct dilation at that time.  He was staged him to be T3 N0 MX, with FNA of the mass showing adenocarcinoma.  Post surgery pathology was consistent with T2N0 pancreatic cancer, negative margins."  Current Outpatient Medications  Medication Sig Dispense Refill  . ACCU-CHEK AVIVA PLUS test strip U ONCE TO BID UTD  0  . ACCU-CHEK SOFTCLIX LANCETS lancets TEST 1-2 XD  0  . acetaminophen (TYLENOL) 325 MG tablet Take by mouth.    Marland Kitchen amiodarone (PACERONE) 200 MG tablet Take 200 mg by mouth daily.  0  . atorvastatin (LIPITOR) 40 MG tablet Take 40 mg by mouth every evening.    . Blood Glucose Monitoring Suppl (ACCU-CHEK AVIVA PLUS) w/Device KIT U TO TEST ONCE TO BID  0  . ferrous sulfate 325 (65 FE) MG EC tablet Take 1 tablet (325 mg total) by mouth 2 (two) times  daily. 60 tablet 0  . GLOBAL EASE INJECT PEN NEEDLES 31G X 5 MM MISC Inject 1 Units into the skin daily. 100 each 0  . Insulin Glargine (BASAGLAR KWIKPEN) 100 UNIT/ML SOPN Inject 0.28 mLs (28 Units total) into the skin at bedtime. 12 mL 0  . insulin lispro (HUMALOG KWIKPEN) 100 UNIT/ML KiwkPen Inject 0.04 mLs (4 Units total) into the skin 3 (three) times daily with meals. 12 mL 0  . LEVEMIR FLEXTOUCH 100 UNIT/ML Pen INJECT 28 UNITS ONCE AT BEDTIME  0  . metoprolol tartrate (LOPRESSOR) 25 MG tablet Take by mouth.    . Multiple Vitamin (MULTI-VITAMINS) TABS Take by mouth.    . Needles & Syringes MISC Use as directed 100 each 3  . NOVOLOG MIX 70/30 FLEXPEN (70-30) 100 UNIT/ML FlexPen INJECT 4U PRIOR TO MEAL THREE TIMES A DAY SUBCUTANEOUS  0  . SANTYL ointment APP EXT AA D  0   No current facility-administered medications for this visit.     Allergies as of 09/25/2017  . (No Known Allergies)    ROS:  General: Negative for anorexia, weight loss, fever, chills, fatigue, weakness. ENT: Negative for hoarseness, difficulty swallowing , nasal congestion. CV: Negative for chest pain, angina, palpitations, dyspnea on exertion, peripheral edema.  Respiratory: Negative for dyspnea at rest, dyspnea on exertion, cough, sputum, wheezing.  GI: See history of present illness. GU:  Negative for dysuria, hematuria, urinary incontinence, urinary frequency, nocturnal urination.  Endo: Negative for unusual weight change.    Physical Examination:   BP 119/69   Pulse 76   Temp 98.6 F (37 C) (Oral)   Ht _0  (1.803 m)   Wt 149 lb 3.2 oz (67.7 kg)   BMI 20.81 kg/m   General: Well-nourished, well-developed in no acute distress.  Eyes: No icterus. Conjunctivae pink. Mouth: Oropharyngeal mucosa moist and pink , no lesions erythema or exudate. Lungs: Clear to auscultation bilaterally. Non-labored. Heart: Regular rate and rhythm, no murmurs rubs or gallops.  Abdomen: Bowel sounds are normal, nontender,  nondistended, no hepatosplenomegaly or masses, no abdominal bruits or hernia , no rebound or guarding.   Extremities: No lower extremity edema. No clubbing or deformities. Neuro: Alert and oriented x 3.  Grossly intact. Skin: Warm and dry, no jaundice.   Psych: Alert and cooperative, normal mood and affect.   Labs: CMP     Component Value Date/Time   NA 134 (L) 09/11/2017 0435   K 3.7 09/11/2017 0435   CL 106 09/11/2017 0435   CO2 25 09/11/2017 0435   GLUCOSE 235 (H) 09/11/2017 0435   BUN 14 09/11/2017 0435   CREATININE 0.97 09/11/2017 0435   CALCIUM 8.1 (L) 09/11/2017 0435   PROT 6.5 09/05/2017 1138   ALBUMIN 3.1 (L) 09/05/2017 1138   AST 14 (L) 09/05/2017 1138   ALT 11 (L) 09/05/2017 1138   ALKPHOS 24 (L) 09/05/2017 1138   BILITOT 1.7 (H) 09/05/2017 1138   GFRNONAA >60 09/11/2017 0435   GFRAA >60 09/11/2017 0435   Lab Results  Component Value Date   WBC 8.2 09/10/2017   HGB 9.1 (L) 09/10/2017   HCT 26.6 (L) 09/10/2017   MCV 86.7 09/10/2017   PLT 365 09/10/2017    Imaging Studies: Ct Abdomen Pelvis Wo Contrast  Result Date: 09/05/2017 CLINICAL DATA:  Severe abdominal pain. 61 y.o. yo male with borderline resectable pancreatic cancer now s/p distal pancreatectomy + splenectomy and excisional liver biopsy (negative) on 07/31/17 at [facility]. Pathology c/w T2N0 pancreatic cancer, negative margins. EXAM: CT ABDOMEN AND PELVIS WITHOUT CONTRAST TECHNIQUE: Multidetector CT imaging of the abdomen and pelvis was performed following the standard protocol without IV contrast. COMPARISON:  08/26/2017 FINDINGS: Lower chest:  Mild circumferential wall thickening distal esophagus. Hepatobiliary: No focal abnormality in the liver on this study without intravenous contrast. There is no evidence for gallstones, gallbladder wall thickening, or pericholecystic fluid. No intrahepatic or extrahepatic biliary dilation. Pancreas:  Status post distal gastrectomy. Spleen: Status post splenectomy.  Adrenals/Urinary Tract: No adrenal nodule or mass. No hydronephrosis in either kidney. No hydroureter. The urinary bladder appears normal for the degree of distention. Stomach/Bowel: Stomach is markedly distended with fluid. Duodenum nondistended. Small bowel is nondilated. Terminal ileum is not well seen. The appendix is normal. Moderate stool volume throughout the colon. Vascular/Lymphatic: There is abdominal aortic atherosclerosis without aneurysm. No abdominal lymphadenopathy is evident although assessment is limited by lack of intraabdominal fat, no intravenous contrast material, and some underlying diffuse soft tissue edema. No pelvic sidewall lymphadenopathy. Reproductive: The prostate gland and seminal vesicles have normal imaging features. Other: There is some trace fluid in the right para colic gutter but no substantial ascites evident. Musculoskeletal: Bone windows reveal no worrisome lytic or sclerotic osseous lesions. IMPRESSION: 1. Limited study given lack of oral and IV contrast. Status post distal pancreatectomy and splenectomy. 2. Marked distention of the stomach which is fluid-filled. Features may be related to dysmotility or a component of outlet obstruction. 3. No  evidence for bowel obstruction. No intraperitoneal free air. Minimal intraperitoneal free fluid is identified. 4. Mild circumferential wall thickening distal esophagus. Esophagitis would be a consideration. 5. Diffusely stool-filled colon. In the appropriate clinical setting, this appearance could be compatible with constipation. Electronically Signed   By: Misty Stanley M.D.   On: 09/05/2017 14:11   Dg Chest Port 1 View  Result Date: 09/05/2017 CLINICAL DATA:  Hyperglycemia. History of CHF, coronary artery disease, hypertension, and diabetes. Former smoker. EXAM: PORTABLE CHEST 1 VIEW COMPARISON:  Portable chest x-ray of August 23, 2017 FINDINGS: The lungs are well-expanded and clear. The heart and pulmonary vascularity are  normal. There is calcification in the wall of the aortic arch. There is no pleural effusion. There is multilevel degenerative disc disease of the thoracic spine. IMPRESSION: There is no pneumonia nor other acute cardiopulmonary abnormality. Thoracic aortic atherosclerosis. Electronically Signed   By: David  Martinique M.D.   On: 09/05/2017 11:47    Assessment and Plan:   Richard [name] is a 61 y.o. y/o male here for hospital follow-up, for anemia and esophagitis  Patient states his pain on swallowing has improved since hospital discharge He denies any melena, emesis or hematemesis or bright blood per rectum We will continue PPI twice daily Patient will need repeat EGD in 3 months He has seen cardiology since his discharge and is seeing them again next week and is having a stress test done.  I have asked the patient to discuss need for resuming anticoagulation with his cardiologist.  This was started by cardiology for A. fib.  If anticoagulation is restarted by cardiology, would recommend another agent besides Eliquis since that lead to his bleeding. We will recheck labs at this time to see if hemoglobin is stable Continue to avoid NSAIDs Patient educated on acid reflux lifestyle modifications, and asked to get a bed wedge as well Him and his wife verbalized understanding Patient was asked to call us if he develops melena, bright blood per rectum, any abdominal pain or any other reason for concern, or go to the ER for the same  Dr Vonda Antigua

## 2017-09-25 NOTE — Patient Instructions (Signed)
Obtain a bed wedge and use it at bedtime to help with acid reflux and esophagitis Follow the esophagitis handout and recommendations discussed with you in clinic Follow up with Cardiology You need a repeat EGD in 3 months. Please call us if this has not been scheduled already.

## 2017-09-26 ENCOUNTER — Other Ambulatory Visit
Admission: RE | Admit: 2017-09-26 | Discharge: 2017-09-26 | Disposition: A | Discharge status: Home / Self Care | Payer: Medicare Other | Source: Ambulatory Visit | Attending: Gastroenterology | Admitting: Gastroenterology

## 2017-09-26 DIAGNOSIS — R1310 Dysphagia, unspecified: Secondary | ICD-10-CM | POA: Insufficient documentation

## 2017-09-26 DIAGNOSIS — R531 Weakness: Secondary | ICD-10-CM | POA: Diagnosis present

## 2017-09-26 LAB — COMPREHENSIVE METABOLIC PANEL
ALBUMIN: 3.2 g/dL — AB (ref 3.5–5.0)
ALT: 15 U/L — AB (ref 17–63)
AST: 15 U/L (ref 15–41)
Alkaline Phosphatase: 33 U/L — ABNORMAL LOW (ref 38–126)
Anion gap: 12 (ref 5–15)
BUN: 14 mg/dL (ref 6–20)
CHLORIDE: 95 mmol/L — AB (ref 101–111)
CO2: 26 mmol/L (ref 22–32)
CREATININE: 0.9 mg/dL (ref 0.61–1.24)
Calcium: 9.1 mg/dL (ref 8.9–10.3)
GFR calc Af Amer: >60 mL/min (ref >60)
GLUCOSE: 437 mg/dL — AB (ref 65–99)
Potassium: 4.2 mmol/L (ref 3.5–5.1)
Sodium: 133 mmol/L — ABNORMAL LOW (ref 135–145)
Total Bilirubin: <0.1 mg/dL — ABNORMAL LOW (ref 0.3–1.2)
Total Protein: 7 g/dL (ref 6.5–8.1)

## 2017-09-26 LAB — CBC WITH DIFFERENTIAL/PLATELET
BASOS ABS: 0.1 10*3/uL (ref 0–0.1)
BASOS PCT: 2 %
EOS PCT: 3 %
Eosinophils Absolute: 0.2 10*3/uL (ref 0–0.7)
HCT: 32.9 % — ABNORMAL LOW (ref 40.0–52.0)
Hemoglobin: 10.7 g/dL — ABNORMAL LOW (ref 13.0–18.0)
LYMPHS PCT: 40 %
Lymphs Abs: 2.5 10*3/uL (ref 1.0–3.6)
MCH: 28.6 pg (ref 26.0–34.0)
MCHC: 32.5 g/dL (ref 32.0–36.0)
MCV: 88.2 fL (ref 80.0–100.0)
Monocytes Absolute: 0.8 10*3/uL (ref 0.2–1.0)
Monocytes Relative: 12 %
NEUTROS ABS: 2.6 10*3/uL (ref 1.4–6.5)
Neutrophils Relative %: 43 %
PLATELETS: 434 10*3/uL (ref 150–440)
RBC: 3.73 MIL/uL — AB (ref 4.40–5.90)
RDW: 15.4 % — ABNORMAL HIGH (ref 11.5–14.5)
WBC: 6.1 10*3/uL (ref 3.8–10.6)

## 2017-12-20 ENCOUNTER — Telehealth: Payer: Self-pay

## 2017-12-20 NOTE — Telephone Encounter (Signed)
Dr. Wyonia Hough suggested repeat EGD in 3 months = March.   LVM for patient callback to advise and schedule.

## 2017-12-20 NOTE — Telephone Encounter (Signed)
-----   Message from [place], [place] sent at 09/18/2017  1:53 PM EST ----- Regarding: EGD Pt needs EGD in 3 months per Dr. Bonna Gains.

## 2018-01-09 ENCOUNTER — Emergency Department: Payer: Medicare Other

## 2018-01-09 ENCOUNTER — Inpatient Hospital Stay
Admission: EM | Admit: 2018-01-09 | Discharge: 2018-01-11 | DRG: 871 | Disposition: A | Discharge status: Home / Self Care | Payer: Medicare Other | Attending: Internal Medicine | Admitting: Internal Medicine

## 2018-01-09 ENCOUNTER — Other Ambulatory Visit: Payer: Self-pay

## 2018-01-09 DIAGNOSIS — K8681 Exocrine pancreatic insufficiency: Secondary | ICD-10-CM | POA: Diagnosis present

## 2018-01-09 DIAGNOSIS — N179 Acute kidney failure, unspecified: Secondary | ICD-10-CM | POA: Diagnosis present

## 2018-01-09 DIAGNOSIS — Z79899 Other long term (current) drug therapy: Secondary | ICD-10-CM

## 2018-01-09 DIAGNOSIS — I2510 Atherosclerotic heart disease of native coronary artery without angina pectoris: Secondary | ICD-10-CM | POA: Diagnosis present

## 2018-01-09 DIAGNOSIS — I110 Hypertensive heart disease with heart failure: Secondary | ICD-10-CM | POA: Diagnosis present

## 2018-01-09 DIAGNOSIS — E1111 Type 2 diabetes mellitus with ketoacidosis with coma: Secondary | ICD-10-CM | POA: Diagnosis present

## 2018-01-09 DIAGNOSIS — I248 Other forms of acute ischemic heart disease: Secondary | ICD-10-CM | POA: Diagnosis present

## 2018-01-09 DIAGNOSIS — E785 Hyperlipidemia, unspecified: Secondary | ICD-10-CM | POA: Diagnosis present

## 2018-01-09 DIAGNOSIS — E1140 Type 2 diabetes mellitus with diabetic neuropathy, unspecified: Secondary | ICD-10-CM | POA: Diagnosis present

## 2018-01-09 DIAGNOSIS — C259 Malignant neoplasm of pancreas, unspecified: Secondary | ICD-10-CM | POA: Diagnosis present

## 2018-01-09 DIAGNOSIS — Y838 Other surgical procedures as the cause of abnormal reaction of the patient, or of later complication, without mention of misadventure at the time of the procedure: Secondary | ICD-10-CM | POA: Diagnosis present

## 2018-01-09 DIAGNOSIS — A4189 Other specified sepsis: Secondary | ICD-10-CM | POA: Diagnosis present

## 2018-01-09 DIAGNOSIS — B957 Other staphylococcus as the cause of diseases classified elsewhere: Secondary | ICD-10-CM | POA: Diagnosis present

## 2018-01-09 DIAGNOSIS — T8789 Other complications of amputation stump: Secondary | ICD-10-CM | POA: Diagnosis present

## 2018-01-09 DIAGNOSIS — E860 Dehydration: Secondary | ICD-10-CM | POA: Diagnosis present

## 2018-01-09 DIAGNOSIS — A411 Sepsis due to other specified staphylococcus: Secondary | ICD-10-CM | POA: Diagnosis present

## 2018-01-09 DIAGNOSIS — G629 Polyneuropathy, unspecified: Secondary | ICD-10-CM | POA: Diagnosis present

## 2018-01-09 DIAGNOSIS — M549 Dorsalgia, unspecified: Secondary | ICD-10-CM | POA: Diagnosis present

## 2018-01-09 DIAGNOSIS — R739 Hyperglycemia, unspecified: Secondary | ICD-10-CM

## 2018-01-09 DIAGNOSIS — R402413 Glasgow coma scale score 13-15, at hospital admission: Secondary | ICD-10-CM | POA: Diagnosis present

## 2018-01-09 DIAGNOSIS — Z681 Body mass index (BMI) 19 or less, adult: Secondary | ICD-10-CM | POA: Diagnosis not present

## 2018-01-09 DIAGNOSIS — M79606 Pain in leg, unspecified: Secondary | ICD-10-CM | POA: Diagnosis present

## 2018-01-09 DIAGNOSIS — Z9081 Acquired absence of spleen: Secondary | ICD-10-CM

## 2018-01-09 DIAGNOSIS — G8929 Other chronic pain: Secondary | ICD-10-CM | POA: Diagnosis present

## 2018-01-09 DIAGNOSIS — A419 Sepsis, unspecified organism: Secondary | ICD-10-CM | POA: Diagnosis present

## 2018-01-09 DIAGNOSIS — I5032 Chronic diastolic (congestive) heart failure: Secondary | ICD-10-CM | POA: Diagnosis present

## 2018-01-09 DIAGNOSIS — E43 Unspecified severe protein-calorie malnutrition: Secondary | ICD-10-CM | POA: Diagnosis present

## 2018-01-09 DIAGNOSIS — B349 Viral infection, unspecified: Secondary | ICD-10-CM | POA: Diagnosis present

## 2018-01-09 DIAGNOSIS — Z794 Long term (current) use of insulin: Secondary | ICD-10-CM

## 2018-01-09 DIAGNOSIS — Z955 Presence of coronary angioplasty implant and graft: Secondary | ICD-10-CM

## 2018-01-09 DIAGNOSIS — R410 Disorientation, unspecified: Secondary | ICD-10-CM

## 2018-01-09 DIAGNOSIS — R4182 Altered mental status, unspecified: Secondary | ICD-10-CM

## 2018-01-09 DIAGNOSIS — I480 Paroxysmal atrial fibrillation: Secondary | ICD-10-CM | POA: Diagnosis present

## 2018-01-09 DIAGNOSIS — R52 Pain, unspecified: Secondary | ICD-10-CM

## 2018-01-09 DIAGNOSIS — Z87891 Personal history of nicotine dependence: Secondary | ICD-10-CM

## 2018-01-09 HISTORY — DX: Malignant (primary) neoplasm, unspecified: C80.1

## 2018-01-09 LAB — CBC
HCT: 38.2 % — ABNORMAL LOW (ref 40.0–52.0)
Hemoglobin: 12.4 g/dL — ABNORMAL LOW (ref 13.0–18.0)
MCH: 28.7 pg (ref 26.0–34.0)
MCHC: 32.4 g/dL (ref 32.0–36.0)
MCV: 88.5 fL (ref 80.0–100.0)
PLATELETS: 334 10*3/uL (ref 150–440)
RBC: 4.32 MIL/uL — AB (ref 4.40–5.90)
RDW: 15.1 % — ABNORMAL HIGH (ref 11.5–14.5)
WBC: 10.2 10*3/uL (ref 3.8–10.6)

## 2018-01-09 LAB — URINALYSIS, COMPLETE (UACMP) WITH MICROSCOPIC
BILIRUBIN URINE: NEGATIVE
Glucose, UA: >=500 mg/dL — AB
KETONES UR: 20 mg/dL — AB
LEUKOCYTES UA: NEGATIVE
Nitrite: NEGATIVE
PH: 7 (ref 5.0–8.0)
Protein, ur: NEGATIVE mg/dL
RBC / HPF: NONE SEEN RBC/hpf (ref 0–5)
Specific Gravity, Urine: 1.028 (ref 1.005–1.030)
WBC UA: NONE SEEN WBC/hpf (ref 0–5)

## 2018-01-09 LAB — PROCALCITONIN

## 2018-01-09 LAB — INFLUENZA PANEL BY PCR (TYPE A & B)
Influenza A By PCR: NEGATIVE
Influenza B By PCR: NEGATIVE

## 2018-01-09 LAB — BETA-HYDROXYBUTYRIC ACID: Beta-Hydroxybutyric Acid: 1.8 mmol/L — ABNORMAL HIGH (ref 0.05–0.27)

## 2018-01-09 LAB — GLUCOSE, CAPILLARY
Glucose-Capillary: 555 mg/dL (ref 65–99)
Glucose-Capillary: 409 mg/dL — ABNORMAL HIGH (ref 65–99)
Glucose-Capillary: 356 mg/dL — ABNORMAL HIGH (ref 65–99)
Glucose-Capillary: 205 mg/dL — ABNORMAL HIGH (ref 65–99)

## 2018-01-09 LAB — BASIC METABOLIC PANEL
ANION GAP: 14 (ref 5–15)
BUN: 14 mg/dL (ref 6–20)
CALCIUM: 9.1 mg/dL (ref 8.9–10.3)
CO2: 24 mmol/L (ref 22–32)
CREATININE: 1 mg/dL (ref 0.61–1.24)
Chloride: 98 mmol/L — ABNORMAL LOW (ref 101–111)
GFR calc Af Amer: >60 mL/min (ref >60)
GFR calc non Af Amer: >60 mL/min (ref >60)
GLUCOSE: 579 mg/dL — AB (ref 65–99)
Potassium: 4 mmol/L (ref 3.5–5.1)
Sodium: 136 mmol/L (ref 135–145)

## 2018-01-09 LAB — HEPATIC FUNCTION PANEL
ALK PHOS: 24 U/L — AB (ref 38–126)
ALT: 23 U/L (ref 17–63)
AST: 33 U/L (ref 15–41)
Albumin: 4.2 g/dL (ref 3.5–5.0)
BILIRUBIN INDIRECT: 0.8 mg/dL (ref 0.3–0.9)
Bilirubin, Direct: 0.1 mg/dL (ref 0.1–0.5)
TOTAL PROTEIN: 7.6 g/dL (ref 6.5–8.1)
Total Bilirubin: 0.9 mg/dL (ref 0.3–1.2)

## 2018-01-09 LAB — PROTIME-INR
INR: 1.1
Prothrombin Time: 14.1 seconds (ref 11.4–15.2)

## 2018-01-09 LAB — LIPASE, BLOOD: LIPASE: 31 U/L (ref 11–51)

## 2018-01-09 LAB — BRAIN NATRIURETIC PEPTIDE: B Natriuretic Peptide: 53 pg/mL (ref 0.0–100.0)

## 2018-01-09 LAB — TROPONIN I
TROPONIN I: 1.6 ng/mL — AB (ref <0.03)
Troponin I: 0.58 ng/mL (ref <0.03)
Troponin I: 1.51 ng/mL (ref <0.03)

## 2018-01-09 LAB — HEMOGLOBIN A1C
Hgb A1c MFr Bld: 17 % — ABNORMAL HIGH (ref 4.8–5.6)
Mean Plasma Glucose: 441.2 mg/dL

## 2018-01-09 LAB — LACTIC ACID, PLASMA
Lactic Acid, Venous: 3.4 mmol/L (ref 0.5–1.9)
Lactic Acid, Venous: 2.2 mmol/L (ref 0.5–1.9)
Lactic Acid, Venous: 1.7 mmol/L (ref 0.5–1.9)

## 2018-01-09 LAB — TSH: TSH: 0.321 u[IU]/mL — ABNORMAL LOW (ref 0.350–4.500)

## 2018-01-09 MED ORDER — HEPARIN (PORCINE) IN NACL 100-0.45 UNIT/ML-% IJ SOLN
950.0000 [IU]/h | INTRAMUSCULAR | Status: DC
  Administered 2018-01-09: 700 [IU]/h via INTRAVENOUS
  Filled 2018-01-09: qty 250

## 2018-01-09 MED ORDER — HEPARIN BOLUS VIA INFUSION
3400.0000 [IU] | Freq: Once | INTRAVENOUS | Status: DC
  Filled 2018-01-09: qty 3400

## 2018-01-09 MED ORDER — POTASSIUM CHLORIDE 10 MEQ/100ML IV SOLN
10.0000 meq | INTRAVENOUS | Status: AC
  Administered 2018-01-09 (×2): 10 meq via INTRAVENOUS
  Filled 2018-01-09 (×2): qty 100

## 2018-01-09 MED ORDER — VANCOMYCIN HCL IN DEXTROSE 1-5 GM/200ML-% IV SOLN
1000.0000 mg | Freq: Two times a day (BID) | INTRAVENOUS | Status: DC
  Administered 2018-01-09 – 2018-01-10 (×3): 1000 mg via INTRAVENOUS
  Filled 2018-01-09 (×4): qty 200

## 2018-01-09 MED ORDER — ACETAMINOPHEN 650 MG RE SUPP: 650.0000 mg | Freq: Four times a day (QID) | RECTAL | Status: DC | PRN

## 2018-01-09 MED ORDER — HEPARIN BOLUS VIA INFUSION
3400.0000 [IU] | Freq: Once | INTRAVENOUS | Status: AC
  Administered 2018-01-09: 3400 [IU] via INTRAVENOUS
  Filled 2018-01-09: qty 3400

## 2018-01-09 MED ORDER — ENOXAPARIN SODIUM 40 MG/0.4ML ~~LOC~~ SOLN: 40.0000 mg | SUBCUTANEOUS | Status: DC

## 2018-01-09 MED ORDER — SODIUM CHLORIDE 0.9 % IV BOLUS (SEPSIS)
1000.0000 mL | Freq: Once | INTRAVENOUS | Status: AC
  Administered 2018-01-09: 1000 mL via INTRAVENOUS

## 2018-01-09 MED ORDER — NITROGLYCERIN 2 % TD OINT
TOPICAL_OINTMENT | TRANSDERMAL | Status: AC
  Administered 2018-01-09: 0.5 [in_us] via TOPICAL
  Filled 2018-01-09: qty 1

## 2018-01-09 MED ORDER — ONDANSETRON HCL 4 MG PO TABS: 4.0000 mg | ORAL_TABLET | Freq: Four times a day (QID) | ORAL | Status: DC | PRN

## 2018-01-09 MED ORDER — SODIUM CHLORIDE 0.9 % IV SOLN
INTRAVENOUS | Status: DC
  Administered 2018-01-09: 1000 mL via INTRAVENOUS
  Administered 2018-01-09 – 2018-01-10 (×4): via INTRAVENOUS

## 2018-01-09 MED ORDER — NITROGLYCERIN 2 % TD OINT
0.5000 [in_us] | TOPICAL_OINTMENT | TRANSDERMAL | Status: AC
  Administered 2018-01-09: 0.5 [in_us] via TOPICAL

## 2018-01-09 MED ORDER — AMIODARONE HCL 200 MG PO TABS
200.0000 mg | ORAL_TABLET | Freq: Every day | ORAL | Status: DC
  Administered 2018-01-09 – 2018-01-11 (×3): 200 mg via ORAL
  Filled 2018-01-09 (×4): qty 1

## 2018-01-09 MED ORDER — SODIUM CHLORIDE 0.9 % IV BOLUS (SEPSIS): 1000.0000 mL | Freq: Once | INTRAVENOUS | Status: DC

## 2018-01-09 MED ORDER — PIPERACILLIN-TAZOBACTAM 3.375 G IVPB
3.3750 g | Freq: Three times a day (TID) | INTRAVENOUS | Status: DC
  Administered 2018-01-09 – 2018-01-10 (×5): 3.375 g via INTRAVENOUS
  Filled 2018-01-09 (×5): qty 50

## 2018-01-09 MED ORDER — INSULIN GLARGINE 100 UNIT/ML ~~LOC~~ SOLN
22.0000 [IU] | Freq: Every day | SUBCUTANEOUS | Status: DC
  Administered 2018-01-09 – 2018-01-11 (×3): 22 [IU] via SUBCUTANEOUS
  Filled 2018-01-09 (×5): qty 0.22

## 2018-01-09 MED ORDER — ACETAMINOPHEN 325 MG PO TABS
650.0000 mg | ORAL_TABLET | Freq: Four times a day (QID) | ORAL | Status: DC | PRN
  Administered 2018-01-09 – 2018-01-11 (×4): 650 mg via ORAL
  Filled 2018-01-09 (×4): qty 2

## 2018-01-09 MED ORDER — METOPROLOL TARTRATE 25 MG PO TABS
25.0000 mg | ORAL_TABLET | Freq: Two times a day (BID) | ORAL | Status: DC
  Administered 2018-01-09 – 2018-01-11 (×3): 25 mg via ORAL
  Filled 2018-01-09 (×4): qty 1

## 2018-01-09 MED ORDER — POTASSIUM CHLORIDE 10 MEQ/100ML IV SOLN
10.0000 meq | INTRAVENOUS | Status: AC
Start: 2018-01-09 — End: 2018-01-09
  Administered 2018-01-09: 10 meq via INTRAVENOUS
  Filled 2018-01-09 (×2): qty 100

## 2018-01-09 MED ORDER — PIPERACILLIN-TAZOBACTAM 3.375 G IVPB 30 MIN
3.3750 g | Freq: Once | INTRAVENOUS | Status: AC
  Administered 2018-01-09: 3.375 g via INTRAVENOUS
  Filled 2018-01-09: qty 50

## 2018-01-09 MED ORDER — VANCOMYCIN HCL IN DEXTROSE 1-5 GM/200ML-% IV SOLN
1000.0000 mg | Freq: Once | INTRAVENOUS | Status: AC
  Administered 2018-01-09: 1000 mg via INTRAVENOUS
  Filled 2018-01-09: qty 200

## 2018-01-09 MED ORDER — DOCUSATE SODIUM 100 MG PO CAPS
100.0000 mg | ORAL_CAPSULE | Freq: Two times a day (BID) | ORAL | Status: DC
  Administered 2018-01-09 – 2018-01-11 (×4): 100 mg via ORAL
  Filled 2018-01-09 (×3): qty 1

## 2018-01-09 MED ORDER — INSULIN REGULAR HUMAN 100 UNIT/ML IJ SOLN
8.0000 [IU] | INTRAMUSCULAR | Status: AC
  Administered 2018-01-09: 8 [IU] via INTRAVENOUS
  Filled 2018-01-09: qty 0.08

## 2018-01-09 MED ORDER — SODIUM CHLORIDE 0.9 % IV BOLUS (SEPSIS)
250.0000 mL | Freq: Once | INTRAVENOUS | Status: AC
  Administered 2018-01-09: 250 mL via INTRAVENOUS

## 2018-01-09 MED ORDER — ACETAMINOPHEN 650 MG RE SUPP
650.0000 mg | Freq: Once | RECTAL | Status: AC
  Administered 2018-01-09: 650 mg via RECTAL

## 2018-01-09 MED ORDER — ACETAMINOPHEN 650 MG RE SUPP
RECTAL | Status: AC
  Filled 2018-01-09: qty 1

## 2018-01-09 MED ORDER — ASPIRIN 81 MG PO CHEW
81.0000 mg | CHEWABLE_TABLET | Freq: Every day | ORAL | Status: DC
  Administered 2018-01-09 – 2018-01-11 (×3): 81 mg via ORAL
  Filled 2018-01-09 (×4): qty 1

## 2018-01-09 MED ORDER — INSULIN GLARGINE 100 UNIT/ML ~~LOC~~ SOLN
22.0000 [IU] | Freq: Every day | SUBCUTANEOUS | Status: DC
  Filled 2018-01-09: qty 0.22

## 2018-01-09 MED ORDER — SODIUM CHLORIDE 0.9 % IV SOLN: INTRAVENOUS | Status: DC

## 2018-01-09 MED ORDER — INSULIN ASPART 100 UNIT/ML ~~LOC~~ SOLN
SUBCUTANEOUS | Status: AC
  Filled 2018-01-09: qty 1

## 2018-01-09 MED ORDER — ONDANSETRON HCL 4 MG/2ML IJ SOLN: 4.0000 mg | Freq: Four times a day (QID) | INTRAMUSCULAR | Status: DC | PRN

## 2018-01-09 MED ORDER — INSULIN ASPART 100 UNIT/ML ~~LOC~~ SOLN
0.0000 [IU] | Freq: Three times a day (TID) | SUBCUTANEOUS | Status: DC
  Administered 2018-01-09: 5 [IU] via SUBCUTANEOUS
  Administered 2018-01-09 – 2018-01-10 (×2): 15 [IU] via SUBCUTANEOUS
  Administered 2018-01-10: 3 [IU] via SUBCUTANEOUS
  Administered 2018-01-11: 2 [IU] via SUBCUTANEOUS
  Filled 2018-01-09 (×5): qty 1

## 2018-01-09 NOTE — Progress Notes (Signed)
Inpatient Diabetes Program Recommendations  AACE/ADA: New Consensus Statement on Inpatient Glycemic Control (2015)  Target Ranges:  Prepandial:   less than 140 mg/dL      Peak postprandial:   less than 180 mg/dL (1-2 hours)      Critically ill patients:  140 - 180 mg/dL   Lab Results  Component Value Date   GLUCAP 356 (H) 01/09/2018   HGBA1C 8.8 (H) 08/24/2017    Review of Glycemic ControlResults for LEWIS, KEATS (MRN [id number]) as of 01/09/2018 13:59  Ref. Range 01/09/2018 03:42 01/09/2018 09:37 01/09/2018 12:00  Glucose-Capillary Latest Ref Range: 65 - 99 mg/dL 555 (HH) 409 (H) 356 (H)    Diabetes history: Type 2 DM Outpatient Diabetes medications: Basaglar 28 units daily, Humalog 4 units tid with meals Current orders for Inpatient glycemic control:  Lantus 22 units daily, Novolog moderate tid with meals   Inpatient Diabetes Program Recommendations:    A1C in process.  Agree with the addition of Lantus to current insulin regimen.  Will follow.   Thanks,  Adah Perl, RN, BC-ADM Inpatient Diabetes Coordinator Pager [phone] (8a-5p)

## 2018-01-09 NOTE — Progress Notes (Signed)
[facility] NAME: Richard [name]    MR#:  [id number]  DATE OF BIRTH:  [birth year]  SUBJECTIVE:  Patient came in with high-grade fever of 102 and significant dehydration.  He is quite lethargic sleepy unable to keep his eyes open for history review of system.  Patient says he is just not feeling well and he falls back to sleep. No family in the room REVIEW OF SYSTEMS:   Review of Systems  Unable to perform ROS: Mental acuity     DRUG ALLERGIES:  No Known Allergies  VITALS:  Blood pressure 130/62, pulse 98, temperature (!) 100.7 F (38.2 C), temperature source Oral, resp. rate 18, height 6' (1.829 m), weight 59.6 kg (131 lb 4.8 oz), SpO2 96 %.  PHYSICAL EXAMINATION:   Physical Exam  GENERAL:  62 y.o.-year-old patient lying in the bed with no acute distress.  EYES: Pupils equal, round, reactive to light and accommodation. No scleral icterus. Extraocular muscles intact.  HEENT: Head atraumatic, normocephalic. Oropharynx and nasopharynx dry with chapped lips NECK:  Supple, no jugular venous distention. No thyroid enlargement, no tenderness.  LUNGS: Normal breath sounds bilaterally, no wheezing, rales, rhonchi. No use of accessory muscles of respiration.  CARDIOVASCULAR: S1, S2 normal. No murmurs, rubs, or gallops.  Tachycardia ABDOMEN: Soft, nontender, nondistended. Bowel sounds present. No organomegaly or mass.  EXTREMITIES: No cyanosis, clubbing or edema b/l.    NEUROLOGIC: Moves all extremities well.  Unable to assess in detail secondary to renal pathology PSYCHIATRIC:  patient is sleepy awakens on verbal command however falls back to sleep again SKIN: No obvious rash, lesion, or ulcer.   LABORATORY PANEL:  CBC Recent Labs  Lab 01/09/18 0347  WBC 10.2  HGB 12.4*  HCT 38.2*  PLT 334    Chemistries  Recent Labs  Lab 01/09/18 0410 01/09/18 0430  NA 136  --   K 4.0  --   CL 98*  --   CO2 24  --   GLUCOSE 579*   --   BUN 14  --   CREATININE 1.00  --   CALCIUM 9.1  --   AST  --  33  ALT  --  23  ALKPHOS  --  24*  BILITOT  --  0.9   Cardiac Enzymes Recent Labs  Lab 01/09/18 1445  TROPONINI 1.60*   RADIOLOGY:  Dg Chest 1 View  Result Date: 01/09/2018 CLINICAL DATA:  Sepsis.  Tachycardia and fever. EXAM: CHEST  1 VIEW COMPARISON:  Radiograph 09/05/2017 FINDINGS: The cardiomediastinal contours are normal. The lungs are clear. Pulmonary vasculature is normal. No consolidation, pleural effusion, or pneumothorax. No acute osseous abnormalities are seen. IMPRESSION: No active disease. Electronically Signed   By: Jeb Levering M.D.   On: 01/09/2018 04:28   Dg Foot Complete Right  Result Date: 01/09/2018 CLINICAL DATA:  Initial evaluation for possible osteomyelitis. EXAM: RIGHT FOOT COMPLETE - 3+ VIEW COMPARISON:  Prior radiograph from 11/03/2015. FINDINGS: Patient status post amputation of the right forefoot through the proximal shafts of the metatarsals. Amputated margins appear sharply demarcated without radiographic evidence for osteomyelitis. Overlying soft tissue irregularity may reflect ulceration. No abnormal soft tissue emphysema. Prominent vascular calcifications noted within the lower leg and foot. No acute fracture or dislocation. IMPRESSION: 1. 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 No radiographic evidence for osteomyelitis. 2. No other acute osseous abnormality. Electronically Signed   By: Jeannine Boga M.D.   On: 01/09/2018  05:59   ASSESSMENT AND PLAN:   Dvaughn Fickle is a 62 year old male with history of uncontrolled diabetes, coronary artery disease, atrial fibrillation, adenocarcinoma of the pancreas getting chemotherapy at [facility] and CHF presents to the emergency department via EMS due to high blood sugar.  The patient reported his glucometer is reading "high".  Further details are difficult to gather as the patient was confused upon arrival to the emergency  department, although he was able to follow commands   1.  Acute febrile illness/sepsis -Source remains unknown -Patient has right metatarsal amputation skin breakdown present however no significant discharge or signs of cellulitis noted -UA and chest x-ray clear -Blood culture so far negative -Influenza A negative -Continue broad-spectrum antibiotic with Vanco and Zosyn -Infectious disease and podiatry to follow  2.  Elevated troponin/acute NST EMI/demand ischemia in the setting of #1 -Patient has history of coronary artery disease--details not available at present -We will transfer patient to telemetry start on heparin drip continue aspirin, beta-blockers -Spoke with Dr. Rockey Situ from cardiology to see patient -Troponin x3 -Patient had cardiac cath in 2010 at [facility] unable to obtain results results  3.  Uncontrolled diabetes -A1c is 17 -Diabetes coordinator consultation appreciated--follow recommendations  4.  Acute renal failure with clinical dehydration -Continue IV fluids  5.  History of atrial fibrillation -Patient currently on amiodarone and metoprolol -Currently not sure if patient is taking Eliquis or not.  He had GI bleed in November 2018 when anticoagulation was held. -Patient's medication reconciliation has not been done.  6.  Adenocarcinoma of the pancreas status post resection of his pancreas 07/2017 and currently getting adjuvant chemotherapy at [facility] discussed with Care Management/Social Worker. Management plans discussed with the patient, family and they are in agreement.  CODE STATUS: Full  DVT Prophylaxis: Heparin drip  TOTAL TIME TAKING CARE OF THIS PATIENT: 35 minutes.  >50% time spent on counselling and coordination of care  POSSIBLE D/C IN *2-3* DAYS, DEPENDING ON CLINICAL CONDITION.  Note: This dictation was prepared with Dragon dictation along with smaller phrase technology. Any transcriptional errors that result from this process are  unintentional.  Fritzi Mandes M.D on 01/09/2018 at 5:54 PM  Between 7am to 6pm - Pager - [phone]  After 6pm go to [email or web address] - password [email or web address] [facility]  Office  [phone]  CC: Primary care physician; [facility] HealthPatient ID: Jabori Henegar, male   DOB: Jun 12, 1956, 62 y.o.   MRN: [id number]

## 2018-01-09 NOTE — Progress Notes (Signed)
Spoke with patient's fiance, who was given an update on patient location and plan of care.

## 2018-01-09 NOTE — Progress Notes (Signed)
CODE SEPSIS - PHARMACY COMMUNICATION  **Broad Spectrum Antibiotics should be administered within 1 hour of Sepsis diagnosis**  Time Code Sepsis Called/Page Received: 9747 1855  Antibiotics Ordered: vanc Zosyn 0321 [id number]  Time of 1st antibiotic administration: 0158 [id number]  Additional action taken by pharmacy:   If necessary, Name of Provider/Nurse Contacted:     Eloise Harman ,PharmD Clinical Pharmacist  01/09/2018  4:25 AM

## 2018-01-09 NOTE — ED Notes (Addendum)
Dr. Fritzi Mandes paged for elevated troponin of 0.58. Verbal readback and verbal orders received.   Pt denies active chest pain. C/o cramping abdominal pain which family reports is normal since having pancreas removed. Pt in NAD.

## 2018-01-09 NOTE — ED Notes (Signed)
Attempt to call report X 1, placed on hold for 7 minutes. Attempted to call back, sent to busy signal.

## 2018-01-09 NOTE — Progress Notes (Signed)
ANTICOAGULATION CONSULT NOTE - Initial Consult  Pharmacy Consult for heparin Indication: chest pain/ACS  No Known Allergies  Patient Measurements: Height: 6' (182.9 cm) Weight: 131 lb 4.8 oz (59.6 kg) IBW/kg (Calculated) : 77.6 Heparin Dosing Weight: 59 kg  Vital Signs: Temp: 100.7 F (38.2 C) (03/21 1028) Temp Source: Oral (03/21 1028) BP: 130/62 (03/21 1028) Pulse Rate: 98 (03/21 1028)  Labs: Recent Labs    01/09/18 0347 01/09/18 0410 01/09/18 0758 01/09/18 0828 01/09/18 1445  HGB 12.4*  --   --   --   --   HCT 38.2*  --   --   --   --   PLT 334  --   --   --   --   LABPROT  --   --   --  14.1  --   INR  --   --   --  1.10  --   CREATININE  --  1.00  --   --   --   TROPONINI  --   --  0.58*  --  1.60*    Estimated Creatinine Clearance: 63.7 mL/min (by C-G formula based on SCr of 1 mg/dL).   Medical History: Past Medical History:  Diagnosis Date  . Atrial fibrillation ([place])    per patient  . Cancer ([place])   . CHF (congestive heart failure) ([place])   . Chronic back pain   . Chronic leg pain   . Coronary artery disease   . Diabetes mellitus without complication ([place])   . Hypertension   . Neuropathy    Assessment: Pharmacy consulted to dose and monitor heparin in this 62 year old male for ACS. Patient not on any anticoagulants per med rec and discussion with MD. Baseline labs ordered.  Goal of Therapy:  Heparin level 0.3-0.7 units/ml Monitor platelets by anticoagulation protocol: Yes   Plan:  Give 3400 units bolus x 1 Start heparin infusion at 700 units/hr Check anti-Xa level in 6 hours and daily while on heparin Continue to monitor H&H and platelets  Lenis Noon, PharmD, BCPS Clinical Pharmacist 01/09/2018,6:09 PM

## 2018-01-09 NOTE — ED Notes (Signed)
Report to Wynette, RN 

## 2018-01-09 NOTE — ED Notes (Signed)
Attempt to call report, talked to [place] who states that they were unsure if patient could go to that unit due to blood. Sugar. Rechecked and called back at 0935.

## 2018-01-09 NOTE — ED Notes (Addendum)
Taken to floor by ED tech, [employer] and [place], RN All of belongings sent with wife.

## 2018-01-09 NOTE — Progress Notes (Signed)
Report called to brittany rn on 2a. For pt.pt  To be transferred to rm 254.

## 2018-01-09 NOTE — Progress Notes (Signed)
Pt more lethargic on arrival to floor this am but responed to voice.  Later this pm became more responsive but  Still wanting to sleep. Denied any pain or any c/o chest pain.  Troponin  Elevated this pm so dr  Gus Height patel  Transferring pt to tele.  k doses given/ insulin doses given. From fsbs.  vanc and zosyn given.  Will call report and pt will be transferred when bed available.vss

## 2018-01-09 NOTE — Progress Notes (Signed)
Pharmacy Antibiotic Note  Richard [name] is a 62 y.o. male admitted on 01/09/2018 with sepsis.  Pharmacy has been consulted for vancomycin and Zosyn dosing.  Plan: Zosyn 3.375g IV q8h (4 hour infusion).   DW 68kg  Vd 48L kei 0.063 hr-1  T1/2 11 hours Vancomycin 1 gram q 12 hours ordered with stacked dosing . Level before 5th dose. Goal trough 15-20.   Height: 6' (182.9 cm) Weight: 149 lb (67.6 kg) IBW/kg (Calculated) : 77.6  Temp (24hrs), Avg:102.3 F (39.1 C), Min:102 F (38.9 C), Max:102.5 F (39.2 C)  Recent Labs  Lab 01/09/18 0347 01/09/18 0356 01/09/18 0410  WBC 10.2  --   --   CREATININE  --   --  1.00  LATICACIDVEN  --  3.4*  --     Estimated Creatinine Clearance: 72.3 mL/min (by C-G formula based on SCr of 1 mg/dL).    No Known Allergies  Antimicrobials this admission: Vancomycin, Zosyn 3/21  >>    >>   Dose adjustments this admission:   Microbiology results: 3/21 BCx: pending 3/21 UCx: pending       3/21 CXR: no active dosease 3/21 UA: (-) Thank you for allowing pharmacy to be a part of this patient's care.  Richard [name] S 01/09/2018 6:15 AM

## 2018-01-09 NOTE — ED Notes (Addendum)
Dr Karma Greaser made aware at this time of elevated Lactic acid level as reported by lab: 3.4 mmol/L

## 2018-01-09 NOTE — ED Notes (Signed)
Pt had BM on himself, cleaned and new sheets placed on bed, new gown.

## 2018-01-09 NOTE — ED Triage Notes (Signed)
Pt arrives to ED via [facility] from home with c/o HYPERglycemia. EMS reports being called out to residence for reports of glucometer reading "HIGH" when FSBS checked at home. Pt is confused and difficult to re-direct. Pt arrives tachycardiac and febrile; will be a CODE SEPSIS.

## 2018-01-09 NOTE — ED Provider Notes (Signed)
[facility] Emergency Department Provider Note  ____________________________________________   None    (approximate)  I have reviewed the triage vital signs and the nursing notes.   HISTORY  Chief Complaint Hyperglycemia  Level 5 caveat:  history/ROS limited by acute/critical illness  HPI Richard [name] is a 62 y.o. male with medical history as listed below which notably includes insulin-dependent diabetes, apparently intermittent atrial fibrillation, and history of osteomyelitis of his right foot status post surgical amputation of multiple digits.  He presents by EMS for elevated blood sugar but when he arrived to became clear that he has altered mental status and is febrile.  Very minimal history was provided by family to EMS and the patient cannot provide any history.  He will make noises and responds to commands but is not communicate right now.  He is having  rigors/chills and is febrile and tachycardic upon arrival.  There is no provided history of any recent illnesses.  The patient is not able to offer any review of systems.  His blood sugar upon arrival was greater than 500.  Past Medical History:  Diagnosis Date  . Atrial fibrillation ([place])    per patient  . CHF (congestive heart failure) ([place])   . Chronic back pain   . Chronic leg pain   . Coronary artery disease   . Diabetes mellitus without complication ([place])   . Hypertension   . Neuropathy     Patient Active Problem List   Diagnosis Date Noted  . Sepsis ([place]) 01/09/2018  . DKA, type 2 ([place]) 09/05/2017  . Ileus ([place]) 08/24/2017  . Diabetes ([place]) 08/24/2017  . Atrial flutter ([place]) 08/24/2017  . CAD (coronary artery disease) 08/24/2017  . HTN (hypertension) 08/24/2017  . Atrial fibrillation ([place]) 08/24/2017  . Tobacco use disorder, severe, dependence 07/25/2017  . Wound dehiscence, surgical, initial encounter 06/24/2017  . Acute hematogenous osteomyelitis of right foot ([place]) 06/20/2017    . Adenocarcinoma of pancreas ([place]) 06/20/2017  . Esophagitis 06/20/2017  . Cellulitis of right lower extremity 06/10/2017  . Hypertension, poor control 06/10/2017  . Insulin dependent type 2 diabetes mellitus ([place]) 06/10/2017  . Chest pain 08/10/2015    Past Surgical History:  Procedure Laterality Date  . CORONARY ANGIOPLASTY WITH STENT PLACEMENT    . ESOPHAGOGASTRODUODENOSCOPY N/A 09/11/2017   Procedure: ESOPHAGOGASTRODUODENOSCOPY (EGD);  Surgeon: Virgel Manifold, MD;  Location: [facility] ENDOSCOPY;  Service: Endoscopy;  Laterality: N/A;    Prior to Admission medications   Medication Sig Start Date End Date Taking? Authorizing Provider  ACCU-CHEK AVIVA PLUS test strip U ONCE TO BID UTD 09/17/17   [provider]  ACCU-CHEK SOFTCLIX LANCETS lancets TEST 1-2 XD 09/17/17   [provider]  acetaminophen (TYLENOL) 325 MG tablet Take by mouth.    [provider]  amiodarone (PACERONE) 200 MG tablet Take 200 mg by mouth daily. 09/20/17   [provider]  atorvastatin (LIPITOR) 40 MG tablet Take 40 mg by mouth every evening.    [provider]  Blood Glucose Monitoring Suppl (ACCU-CHEK AVIVA PLUS) w/Device KIT U TO TEST ONCE TO BID 09/17/17   [provider]  ferrous sulfate 325 (65 FE) MG EC tablet Take 1 tablet (325 mg total) by mouth 2 (two) times daily. 09/11/17 09/11/18  Hillary Bow, MD  GLOBAL EASE INJECT PEN NEEDLES 31G X 5 MM MISC Inject 1 Units into the skin daily. 09/11/17   Hillary Bow, MD  Insulin Glargine (BASAGLAR KWIKPEN) 100 UNIT/ML SOPN  Inject 0.28 mLs (28 Units total) into the skin at bedtime. 09/11/17   Hillary Bow, MD  insulin lispro (HUMALOG KWIKPEN) 100 UNIT/ML KiwkPen Inject 0.04 mLs (4 Units total) into the skin 3 (three) times daily with meals. 09/11/17   Hillary Bow, MD  LEVEMIR FLEXTOUCH 100 UNIT/ML Pen INJECT 28 UNITS ONCE AT BEDTIME 09/11/17   [provider]  metoprolol tartrate  (LOPRESSOR) 25 MG tablet Take by mouth. 06/21/17 06/21/18  [provider]  Multiple Vitamin (MULTI-VITAMINS) TABS Take by mouth. 06/22/17 06/22/18  [provider]  Needles & Syringes MISC Use as directed 01/31/16   Jola Schmidt, MD  NOVOLOG MIX 70/30 FLEXPEN (70-30) 100 UNIT/ML FlexPen INJECT 4U PRIOR TO MEAL THREE TIMES A DAY SUBCUTANEOUS 09/11/17   [provider]  nystatin (MYCOSTATIN) 100000 UNIT/ML suspension take 4 milliliters by mouth four times a day 11/08/17   [provider]  SANTYL ointment APP EXT AA D 09/17/17   [provider]  sulfamethoxazole-trimethoprim (BACTRIM DS,SEPTRA DS) 800-160 MG tablet Take 1 tablet by mouth 2 (two) times daily. 11/08/17   [provider]  TRULICITY 4.27 CW/2.3JS SOPN  11/11/17   [provider]  VOLTAREN 1 % GEL  11/11/17   [provider]    Allergies Patient has no known allergies.  Family History  Problem Relation Age of Onset  . CAD Mother   . Diabetes Mellitus II Father     Social History Social History   Tobacco Use  . Smoking status: Former Smoker    Packs/day: 0.50  . Smokeless tobacco: Never Used  Substance Use Topics  . Alcohol use: No    Alcohol/week: 0.0 oz  . 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 --      Excl. in [place]? --      Constitutional: Appears acutely ill, disoriented, unable to respond to questions. Eyes: Conjunctivae are normal.  Head: Atraumatic. Nose: No congestion/rhinnorhea. Mouth/Throat: Mucous membranes are dry Neck: No stridor.  No meningeal signs.   Cardiovascular: Tachycardia, regular rhythm. Good peripheral circulation. Grossly normal heart sounds. Respiratory: Mild tachypnea but no retractions. Lungs CTAB. Gastrointestinal: Soft and nontender. No distention.  Thin body habitus Musculoskeletal: The patient has had digit amputations of his right foot and had a dressing in place.  The surgery looks relatively recent but it is well-appearing with no evidence of any purulence or wound dehiscence at this time.  Additionally, the patient is able to fully flex, extend, and rotate his head side to side with no neck pain, no limitation of range of motion, no tenderness.  Neurologic: The patient is moving all 4 extremities and has no facial droop but is unable to participate in a neurological exam Skin:  Skin is warm, dry and intact. No rash noted.   ____________________________________________   LABS (all labs ordered are listed, but only abnormal results are displayed)  Labs Reviewed  CBC - Abnormal; Notable for the following components:      Result Value   RBC 4.32 (*)    Hemoglobin 12.4 (*)    HCT 38.2 (*)    RDW 15.1 (*)    All other components within normal limits  URINALYSIS, COMPLETE (UACMP) WITH MICROSCOPIC - Abnormal; Notable for the following components:   Color, Urine STRAW (*)    APPearance CLEAR (*)    Glucose, UA >=500 (*)    Hgb urine dipstick SMALL (*)    Ketones, ur 20 (*)    Bacteria, UA RARE (*)    Squamous Epithelial / LPF 0-5 (*)    All other components within normal limits  GLUCOSE, CAPILLARY - Abnormal; Notable for the following components:   Glucose-Capillary 555 (*)    All other components within normal limits  LACTIC ACID, PLASMA - Abnormal; Notable for the  following components:   Lactic Acid, Venous 3.4 (*)    All other components within normal limits  HEPATIC FUNCTION PANEL - Abnormal; Notable for the following components:   Alkaline Phosphatase 24 (*)    All other components within normal limits  BETA-HYDROXYBUTYRIC ACID - Abnormal; Notable for the following components:   Beta-Hydroxybutyric Acid 1.80 (*)    All other components within normal limits  BASIC METABOLIC PANEL - Abnormal; Notable for the following components:   Chloride 98 (*)    Glucose, Bld 579 (*)    All other components within normal limits  CULTURE, BLOOD (ROUTINE X 2)  CULTURE, BLOOD (ROUTINE X 2)  URINE CULTURE  BRAIN NATRIURETIC PEPTIDE  INFLUENZA PANEL BY PCR (TYPE A & B)  LACTIC ACID, PLASMA  PROTIME-INR  LIPASE, BLOOD  TROPONIN I  PROCALCITONIN  CBG MONITORING, ED   ____________________________________________  EKG  ED ECG REPORT I, Hinda Kehr, the attending physician, personally viewed and interpreted this ECG.  Date: 01/09/2018 EKG Time: 3:45 AM Rate: 119 Rhythm: Sinus tachycardia QRS Axis: normal Intervals: normal ST/T Wave abnormalities: Non-specific ST segment / T-wave changes, but no evidence of acute ischemia. Narrative Interpretation: no evidence of acute ischemia   ____________________________________________  RADIOLOGY I, Hinda Kehr, personally viewed and evaluated these images (plain radiographs) as part of my medical decision making, as well as reviewing the written report by the radiologist.  ED MD interpretation: No indication of infiltrate on chest x-ray.  Official radiology report(s): Dg Chest 1 View  Result Date: 01/09/2018 CLINICAL DATA:  Sepsis.  Tachycardia and fever. EXAM: CHEST  1 VIEW COMPARISON:  Radiograph 09/05/2017 FINDINGS: The cardiomediastinal contours are normal. The lungs are clear. Pulmonary vasculature is normal. No consolidation, pleural effusion, or pneumothorax. No acute osseous abnormalities are seen.  IMPRESSION: No active disease. Electronically Signed   By: Jeb Levering M.D.   On: 01/09/2018 04:28   Dg Foot Complete Right  Result Date: 01/09/2018 CLINICAL DATA:  Initial evaluation for possible osteomyelitis. EXAM: RIGHT FOOT COMPLETE - 3+ VIEW COMPARISON:  Prior radiograph from 11/03/2015. FINDINGS: Patient status post amputation of the right forefoot through the proximal shafts of the metatarsals. Amputated margins appear sharply demarcated without radiographic evidence for osteomyelitis. Overlying soft tissue irregularity may reflect ulceration. No abnormal soft tissue  emphysema. Prominent vascular calcifications noted within the lower leg and foot. No acute fracture or dislocation. IMPRESSION: 1. 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 No radiographic evidence for osteomyelitis. 2. No other acute osseous abnormality. Electronically Signed   By: Jeannine Boga M.D.   On: 01/09/2018 05:59    ____________________________________________   PROCEDURES  Critical Care performed: Yes, see critical care procedure note(s)   Procedure(s) performed:   .Critical Care Performed by: Hinda Kehr, MD Authorized by: Hinda Kehr, MD   Critical care provider statement:    Critical care time (minutes):  45   Critical care time was exclusive of:  Separately billable procedures and treating other patients   Critical care was necessary to treat or prevent imminent or life-threatening deterioration of the following conditions:  Sepsis   Critical care was time spent personally by me on the following activities:  Development of treatment plan with patient or surrogate, discussions with consultants, evaluation of patient's response to treatment, examination of patient, obtaining history from patient or surrogate, ordering and performing treatments and interventions, ordering and review of laboratory studies, ordering and review of radiographic studies, pulse oximetry, re-evaluation of  patient's condition and review of old charts     ____________________________________________   INITIAL IMPRESSION / [place] / ED COURSE  As part of my medical decision making, I reviewed the following data within the [place] notes reviewed and incorporated, Labs reviewed , EKG interpreted , Old chart reviewed, Radiograph reviewed , Discussed with admitting physician  and Notes from prior ED visits    Differential diagnosis includes, but is not limited to, sepsis from any 1 or more of a number of sources (pneumonia, urinary tract infection, bacteremia, osteomyelitis, meningitis, etc.), acute intracranial bleeding, CVA, ACS, DKA.  It is also very possible that he has an acute infection which is led to DKA.  I have made the patient a code sepsis and him initiating aggressive fluid resuscitation and empiric antibiotics.  He has no signs of meningismus at this time but is not able to participate in the history.  I think that pneumonia, urinary tract infection, and/or osteomyelitis are the most probable sources and will proceed with a broad evaluation.  Also checking influenza.  Clinical Course as of Jan 10 651  Thu Jan 09, 2018  0451 Lactic Acid, Venous(!!): 3.4 [CF]  0451 No indication of pneumonia  DG Chest 1 View [CF]  0505 Urinalysis is still pending, going to perform an in and out catheterization now, but the patient has already received antibiotics as per the code sepsis protocol.   [CF]  0546 Negative influenza panel  Influenza panel by PCR (type A & B) [CF]  3428 Metabolic panel is notable for glucose of 579, but otherwise the results are reassuring.  Anion gap is within normal limits and renal function is normal.  Basic metabolic panel(!!) [CF]  7681 Unremarkable hepatic function panel.  Hepatic function panel(!) [CF]  [id number] On my interpretation of the foot radiographs, I do not see any evidence of acute osteomyelitis.  I am calling the  hospitalist service to discuss admission.  DG Foot Complete Right [CF]  0550 I discussed the case by phone with Dr. Marcille Blanco who will admit.  Urinalysis is still pending at this time but the urine sample itself was very clear and did not appear to be a likely source of infection/sepsis.  No indication for CT scans at this time and less the hospitalist has something  specific in mind he would like to order or like me to order.   [CF]  0618 No evidence of UTI  Urinalysis, Complete w Microscopic(!) [CF]  6015 The patient's beta hydroxybutyric acid is significantly elevated.  I will start him on IV insulin per glucose stabilizer protocol along with some IV potassium per protocol.  I updated Dr. Marcille Blanco on my plan.  Beta-Hydroxybutyric Acid(!): 1.80 [CF]    Clinical Course User Index [CF] Hinda Kehr, MD    ____________________________________________  FINAL CLINICAL IMPRESSION(S) / ED DIAGNOSES  Final diagnoses:  Sepsis, due to unspecified organism ([place])  Hyperglycemia  Delirium  Altered mental status, unspecified altered mental status type  Diabetic ketoacidosis with coma associated with type 2 diabetes mellitus ([place])     MEDICATIONS GIVEN DURING THIS VISIT:  Medications  vancomycin (VANCOCIN) IVPB 1000 mg/200 mL premix (has no administration in time range)  piperacillin-tazobactam (ZOSYN) IVPB 3.375 g (has no administration in time range)  potassium chloride 10 mEq in 100 mL IVPB (has no administration in time range)  nitroGLYCERIN (NITROGLYN) 2 % ointment 0.5 inch (has no administration in time range)  insulin regular (NOVOLIN R,HUMULIN R) 100 units/mL injection 8 Units (has no administration in time range)  sodium chloride 0.9 % bolus 1,000 mL (0 mLs Intravenous Stopped 01/09/18 0515)  acetaminophen (TYLENOL) suppository 650 mg (650 mg Rectal Given 01/09/18 0400)  sodium chloride 0.9 % bolus 1,000 mL (0 mLs Intravenous Stopped 01/09/18 0642)    And  sodium chloride 0.9 % bolus 250  mL (0 mLs Intravenous Stopped 01/09/18 0514)  piperacillin-tazobactam (ZOSYN) IVPB 3.375 g (0 g Intravenous Stopped 01/09/18 0446)  vancomycin (VANCOCIN) IVPB 1000 mg/200 mL premix (0 mg Intravenous Stopped 01/09/18 0535)     ED Discharge Orders    None       Note:  This document was prepared using Dragon voice recognition software and may include unintentional dictation errors.    Hinda Kehr, MD 01/09/18 [phone]

## 2018-01-09 NOTE — H&P (Signed)
Richard [name] is an 62 y.o. male.   Chief Complaint: Hyperglycemia HPI: The patient with past medical history of diabetes, coronary artery disease, atrial fibrillation and CHF presents to the emergency department via EMS due to high blood sugar.  The patient reported his glucometer is reading "high".  Further details are difficult to gather as the patient was confused upon arrival to the emergency department, although he was able to follow commands.  He admitted to feeling chills.  He was found to have a fever as well as tachycardia and hyperglycemia.  Lactic acid was elevated.  Blood sugar found to be greater than 500 with concomitant glucosuria.  Patient was started on broad-spectrum antibiotics prior to the emergency department staff called the hospitalist service for admission.  Past Medical History:  Diagnosis Date  . Atrial fibrillation ([place])    per patient  . CHF (congestive heart failure) ([place])   . Chronic back pain   . Chronic leg pain   . Coronary artery disease   . Diabetes mellitus without complication ([place])   . Hypertension   . Neuropathy     Past Surgical History:  Procedure Laterality Date  . CORONARY ANGIOPLASTY WITH STENT PLACEMENT    . ESOPHAGOGASTRODUODENOSCOPY N/A 09/11/2017   Procedure: ESOPHAGOGASTRODUODENOSCOPY (EGD);  Surgeon: Virgel Manifold, MD;  Location[facility] ENDOSCOPY;  Service: Endoscopy;  Laterality: N/A;    Family History  Problem Relation Age of Onset  . CAD Mother   . Diabetes Mellitus II Father    Social History:  reports that he has quit smoking. He smoked 0.50 packs per day. He has never used smokeless tobacco. He reports that he has current or past drug history. Drug: Cocaine. He reports that he does not drink alcohol.  Allergies: No Known Allergies  Prior to Admission medications   Medication Sig Start Date End Date Taking? Authorizing Provider  ACCU-CHEK AVIVA PLUS test strip U ONCE TO BID UTD 09/17/17   [provider]   ACCU-CHEK SOFTCLIX LANCETS lancets TEST 1-2 XD 09/17/17   [provider]  acetaminophen (TYLENOL) 325 MG tablet Take by mouth.    [provider]  amiodarone (PACERONE) 200 MG tablet Take 200 mg by mouth daily. 09/20/17   [provider]  atorvastatin (LIPITOR) 40 MG tablet Take 40 mg by mouth every evening.    [provider]  Blood Glucose Monitoring Suppl (ACCU-CHEK AVIVA PLUS) w/Device KIT U TO TEST ONCE TO BID 09/17/17   [provider]  ferrous sulfate 325 (65 FE) MG EC tablet Take 1 tablet (325 mg total) by mouth 2 (two) times daily. 09/11/17 09/11/18  Hillary Bow, MD  GLOBAL EASE INJECT PEN NEEDLES 31G X 5 MM MISC Inject 1 Units into the skin daily. 09/11/17   Hillary Bow, MD  Insulin Glargine (BASAGLAR KWIKPEN) 100 UNIT/ML SOPN Inject 0.28 mLs (28 Units total) into the skin at bedtime. 09/11/17   Hillary Bow, MD  insulin lispro (HUMALOG KWIKPEN) 100 UNIT/ML KiwkPen Inject 0.04 mLs (4 Units total) into the skin 3 (three) times daily with meals. 09/11/17   Hillary Bow, MD  LEVEMIR FLEXTOUCH 100 UNIT/ML Pen INJECT 28 UNITS ONCE AT BEDTIME 09/11/17   [provider]  metoprolol tartrate (LOPRESSOR) 25 MG tablet Take by mouth. 06/21/17 06/21/18  [provider]  Multiple Vitamin (MULTI-VITAMINS) TABS Take by mouth. 06/22/17 06/22/18  [provider]  Needles & Syringes MISC Use as directed 01/31/16   Jola Schmidt, MD  NOVOLOG MIX 70/30 FLEXPEN (70-30)  100 UNIT/ML FlexPen INJECT 4U PRIOR TO MEAL THREE TIMES A DAY SUBCUTANEOUS 09/11/17   [provider]  nystatin (MYCOSTATIN) 100000 UNIT/ML suspension take 4 milliliters by mouth four times a day 11/08/17   [provider]  SANTYL ointment APP EXT AA D 09/17/17   [provider]  sulfamethoxazole-trimethoprim (BACTRIM DS,SEPTRA DS) 800-160 MG tablet Take 1 tablet by mouth 2 (two) times daily. 11/08/17   [provider]  TRULICITY 2.35  TI/1.4ER SOPN  11/11/17   [provider]  VOLTAREN 1 % GEL  11/11/17   [provider]     Results for orders placed or performed during the hospital encounter of 01/09/18 (from the past 48 hour(s))  Glucose, capillary     Status: Abnormal   Collection Time: 01/09/18  3:42 AM  Result Value Ref Range   Glucose-Capillary 555 (HH) 65 - 99 mg/dL   Comment 1 Document in Chart   CBC     Status: Abnormal   Collection Time: 01/09/18  3:47 AM  Result Value Ref Range   WBC 10.2 3.8 - 10.6 K/uL   RBC 4.32 (L) 4.40 - 5.90 MIL/uL   Hemoglobin 12.4 (L) 13.0 - 18.0 g/dL   HCT 38.2 (L) 40.0 - 52.0 %   MCV 88.5 80.0 - 100.0 fL   MCH 28.7 26.0 - 34.0 pg   MCHC 32.4 32.0 - 36.0 g/dL   RDW 15.1 (H) 11.5 - 14.5 %   Platelets 334 150 - 440 K/uL    Comment: Performed at [facility], [address] [place], [place] [address]  Lactic acid, plasma     Status: Abnormal   Collection Time: 01/09/18  3:56 AM  Result Value Ref Range   Lactic Acid, Venous 3.4 (HH) 0.5 - 1.9 mmol/L    Comment: CRITICAL RESULT CALLED TO, READ BACK BY AND VERIFIED WITH BUSTCH WOODS ON 01/09/18 AT [place] [facility] Performed at [facility], [address] [place], [place] [address]   Brain natriuretic peptide - IF patient is dyspneic     Status: None   Collection Time: 01/09/18  4:04 AM  Result Value Ref Range   B Natriuretic Peptide 53.0 0.0 - 100.0 pg/mL    Comment: Performed at [facility], [address] [place], [place] [address]  Basic metabolic panel     Status: Abnormal   Collection Time: 01/09/18  4:10 AM  Result Value Ref Range   Sodium 136 135 - 145 mmol/L   Potassium 4.0 3.5 - 5.1 mmol/L   Chloride 98 (L) 101 - 111 mmol/L   CO2 24 22 - 32 mmol/L   Glucose, Bld 579 (HH) 65 - 99 mg/dL    Comment: CRITICAL RESULT CALLED TO, READ BACK BY AND VERIFIED WITH  BUTCH WOODS AT 0540 01/09/18 SDR    BUN 14 6 - 20 mg/dL   Creatinine, Ser 1.00 0.61 - 1.24 mg/dL   Calcium 9.1 8.9  - 10.3 mg/dL   GFR calc non Af Amer >60 >60 mL/min   GFR calc Af Amer >60 >60 mL/min    Comment: (NOTE) The eGFR has been calculated using the CKD EPI equation. This calculation has not been validated in all clinical situations. eGFR's persistently <60 mL/min signify possible Chronic Kidney Disease.    Anion gap 14 5 - 15    Comment: Performed at [facility], [address] [place], [place] [address]  Hepatic function panel     Status: Abnormal  Collection Time: 01/09/18  4:30 AM  Result Value Ref Range   Total Protein 7.6 6.5 - 8.1 g/dL   Albumin 4.2 3.5 - 5.0 g/dL   AST 33 15 - 41 U/L   ALT 23 17 - 63 U/L   Alkaline Phosphatase 24 (L) 38 - 126 U/L   Total Bilirubin 0.9 0.3 - 1.2 mg/dL   Bilirubin, Direct 0.1 0.1 - 0.5 mg/dL   Indirect Bilirubin 0.8 0.3 - 0.9 mg/dL    Comment: Performed at [facility], [address] [place], [place] [address]  Beta-hydroxybutyric acid     Status: Abnormal   Collection Time: 01/09/18  4:30 AM  Result Value Ref Range   Beta-Hydroxybutyric Acid 1.80 (H) 0.05 - 0.27 mmol/L    Comment: Performed at [facility], [address] [place], [place] [address]  Influenza panel by PCR (type A & B)     Status: None   Collection Time: 01/09/18  4:57 AM  Result Value Ref Range   Influenza A By PCR NEGATIVE NEGATIVE   Influenza B By PCR NEGATIVE NEGATIVE    Comment: (NOTE) The Xpert Xpress Flu assay is intended as an aid in the diagnosis of  influenza and should not be used as a sole basis for treatment.  This  assay is FDA approved for nasopharyngeal swab specimens only. Nasal  washings and aspirates are unacceptable for Xpert Xpress Flu testing. Performed at [facility], [address] [place], [place] [address]   Urinalysis, Complete w Microscopic     Status: Abnormal   Collection Time: 01/09/18  5:16 AM  Result Value Ref Range   Color, Urine STRAW (A) YELLOW   APPearance CLEAR (A) CLEAR   Specific  Gravity, Urine 1.028 1.005 - 1.030   pH 7.0 5.0 - 8.0   Glucose, UA >=500 (A) NEGATIVE mg/dL   Hgb urine dipstick SMALL (A) NEGATIVE   Bilirubin Urine NEGATIVE NEGATIVE   Ketones, ur 20 (A) NEGATIVE mg/dL   Protein, ur NEGATIVE NEGATIVE mg/dL   Nitrite NEGATIVE NEGATIVE   Leukocytes, UA NEGATIVE NEGATIVE   RBC / HPF NONE SEEN 0 - 5 RBC/hpf   WBC, UA NONE SEEN 0 - 5 WBC/hpf   Bacteria, UA RARE (A) NONE SEEN   Squamous Epithelial / LPF 0-5 (A) NONE SEEN    Comment: Performed at [facility], [address] [place], [name] [address]   Dg Chest 1 View  Result Date: 01/09/2018 CLINICAL DATA:  Sepsis.  Tachycardia and fever. EXAM: CHEST  1 VIEW COMPARISON:  Radiograph 09/05/2017 FINDINGS: The cardiomediastinal contours are normal. The lungs are clear. Pulmonary vasculature is normal. No consolidation, pleural effusion, or pneumothorax. No acute osseous abnormalities are seen. IMPRESSION: No active disease. Electronically Signed   By: Jeb Levering M.D.   On: 01/09/2018 04:28   Dg Foot Complete Right  Result Date: 01/09/2018 CLINICAL DATA:  Initial evaluation for possible osteomyelitis. EXAM: RIGHT FOOT COMPLETE - 3+ VIEW COMPARISON:  Prior radiograph from 11/03/2015. FINDINGS: Patient status post amputation of the right forefoot through the proximal shafts of the metatarsals. Amputated margins appear sharply demarcated without radiographic evidence for osteomyelitis. Overlying soft tissue irregularity may reflect ulceration. No abnormal soft tissue emphysema. Prominent vascular calcifications noted within the lower leg and foot. No acute fracture or dislocation. IMPRESSION: 1. 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 No radiographic evidence for osteomyelitis. 2. No other acute osseous abnormality. Electronically Signed   By: Pincus Badder.D.  On: 01/09/2018 05:59    Review of Systems  Unable to perform ROS: Mental status change    Blood pressure (!) 167/79,  pulse (!) 105, temperature (!) 102.5 F (39.2 C), temperature source Oral, resp. rate 20, height 6' (1.829 m), weight 67.6 kg (149 lb), SpO2 97 %. Physical Exam  Vitals reviewed. Constitutional: He is oriented to person, place, and time. He appears well-developed and well-nourished. No distress.  HENT:  Head: Normocephalic and atraumatic.  Mouth/Throat: Oropharynx is clear and moist.  Eyes: Pupils are equal, round, and reactive to light. Conjunctivae and EOM are normal. No scleral icterus.  Neck: Normal range of motion. Neck supple. No JVD present. No tracheal deviation present. No thyromegaly present.  Cardiovascular: An irregularly irregular rhythm present. Tachycardia present. Exam reveals no gallop and no friction rub.  No murmur heard. Respiratory: Effort normal and breath sounds normal. No respiratory distress.  GI: Soft. Bowel sounds are normal. He exhibits no distension. There is no tenderness.  Genitourinary:  Genitourinary Comments: Deferred  Musculoskeletal: Normal range of motion. He exhibits no edema.  Right foot s/p TMA  Lymphadenopathy:    He has no cervical adenopathy.  Neurological: He is alert and oriented to person, place, and time. No cranial nerve deficit.  Skin: Skin is warm and dry. No erythema.     Assessment/Plan This is a 62 year old male admitted for sepsis. 1.  Sepsis: Unknown source; patient meets criteria via fever, tachycardia and leukocytosis.  He is hemodynamically stable.  In fact his blood pressure is elevated.  Follow blood cultures for growth and sensitivities.  Continue Vanco and Zosyn. 2.  Hypertension: Uncontrolled; the patient is likely missed doses of his antihypertensive medication.  I have restarted his metoprolol and amiodarone.  Awaiting contact with pharmacy or family to complete medication reconciliation.  Apply Nitropaste to his chest for now. 3.  Diabetes mellitus type 2: With hyperglycemia.  The patient does not meet criteria for DKA or  hyper glycemic hyperosmolar state at this time.  I have ordered regular IV insulin and we continue to hydrate with normal saline.  Electrolytes are within normal limits.  Anion gap is normal. 4.  CHF: Diastolic; chronic.  Continue to monitor fluid balance 5.  Atrial fibrillation: Continue amiodarone.  The patient does not appear to be on any anticoagulation including aspirin perhaps due to fall risk (Mali score 2). 6.  Hyperlipidemia: Continue statin therapy 7.  DVT prophylaxis: Lovenox 8.  GI prophylaxis: None The patient is a full code.  Time spent on admission orders and patient care approximately 45 minutes  Harrie Foreman, MD 01/09/2018, 6:50 AM

## 2018-01-10 ENCOUNTER — Inpatient Hospital Stay
Admit: 2018-01-10 | Discharge: 2018-01-10 | Disposition: A | Discharge status: Home / Self Care | Payer: Medicare Other | Attending: Registered Nurse | Admitting: Registered Nurse

## 2018-01-10 DIAGNOSIS — E43 Unspecified severe protein-calorie malnutrition: Secondary | ICD-10-CM

## 2018-01-10 LAB — TROPONIN I
TROPONIN I: 0.78 ng/mL — AB (ref <0.03)
TROPONIN I: 1.04 ng/mL — AB (ref <0.03)

## 2018-01-10 LAB — BLOOD CULTURE ID PANEL (REFLEXED)
Acinetobacter baumannii: NOT DETECTED
CANDIDA GLABRATA: NOT DETECTED
CANDIDA PARAPSILOSIS: NOT DETECTED
Candida albicans: NOT DETECTED
Candida krusei: NOT DETECTED
Candida tropicalis: NOT DETECTED
ENTEROBACTER CLOACAE COMPLEX: NOT DETECTED
ENTEROCOCCUS SPECIES: NOT DETECTED
Enterobacteriaceae species: NOT DETECTED
Escherichia coli: NOT DETECTED
Haemophilus influenzae: NOT DETECTED
Klebsiella oxytoca: NOT DETECTED
Klebsiella pneumoniae: NOT DETECTED
LISTERIA MONOCYTOGENES: NOT DETECTED
Methicillin resistance: NOT DETECTED
Neisseria meningitidis: NOT DETECTED
PROTEUS SPECIES: NOT DETECTED
Pseudomonas aeruginosa: NOT DETECTED
STAPHYLOCOCCUS SPECIES: DETECTED — AB
STREPTOCOCCUS AGALACTIAE: NOT DETECTED
STREPTOCOCCUS PNEUMONIAE: NOT DETECTED
Serratia marcescens: NOT DETECTED
Staphylococcus aureus (BCID): NOT DETECTED
Streptococcus pyogenes: NOT DETECTED
Streptococcus species: NOT DETECTED

## 2018-01-10 LAB — CBC
HCT: 30.6 % — ABNORMAL LOW (ref 40.0–52.0)
Hemoglobin: 10 g/dL — ABNORMAL LOW (ref 13.0–18.0)
MCH: 28.3 pg (ref 26.0–34.0)
MCHC: 32.7 g/dL (ref 32.0–36.0)
MCV: 86.7 fL (ref 80.0–100.0)
Platelets: 249 10*3/uL (ref 150–440)
RBC: 3.53 MIL/uL — AB (ref 4.40–5.90)
RDW: 15.4 % — AB (ref 11.5–14.5)
WBC: 8.1 10*3/uL (ref 3.8–10.6)

## 2018-01-10 LAB — URINE CULTURE: Culture: <10000 — AB

## 2018-01-10 LAB — ECHOCARDIOGRAM COMPLETE
Height: 72 in
WEIGHTICAEL: 2086.4 [oz_av]

## 2018-01-10 LAB — HEPARIN LEVEL (UNFRACTIONATED): Heparin Unfractionated: <0.1 IU/mL — ABNORMAL LOW (ref 0.30–0.70)

## 2018-01-10 LAB — GLUCOSE, CAPILLARY
Glucose-Capillary: 116 mg/dL — ABNORMAL HIGH (ref 65–99)
Glucose-Capillary: 83 mg/dL (ref 65–99)
Glucose-Capillary: 190 mg/dL — ABNORMAL HIGH (ref 65–99)
Glucose-Capillary: 353 mg/dL — ABNORMAL HIGH (ref 65–99)
Glucose-Capillary: 124 mg/dL — ABNORMAL HIGH (ref 65–99)

## 2018-01-10 MED ORDER — SODIUM CHLORIDE 0.9 % IV SOLN
3.0000 g | Freq: Four times a day (QID) | INTRAVENOUS | Status: DC
  Administered 2018-01-10 – 2018-01-11 (×3): 3 g via INTRAVENOUS
  Filled 2018-01-10 (×5): qty 3

## 2018-01-10 MED ORDER — ADULT MULTIVITAMIN W/MINERALS CH
1.0000 | ORAL_TABLET | Freq: Every day | ORAL | Status: DC
  Administered 2018-01-10 – 2018-01-11 (×2): 1 via ORAL
  Filled 2018-01-10 (×2): qty 1

## 2018-01-10 MED ORDER — HEPARIN BOLUS VIA INFUSION: 1700.0000 [IU] | Freq: Once | INTRAVENOUS | Status: DC

## 2018-01-10 MED ORDER — PREMIER PROTEIN SHAKE
11.0000 [oz_av] | Freq: Two times a day (BID) | ORAL | Status: DC
  Administered 2018-01-10 – 2018-01-11 (×3): 11 [oz_av] via ORAL

## 2018-01-10 MED ORDER — OXYCODONE HCL 5 MG PO TABS
5.0000 mg | ORAL_TABLET | ORAL | Status: DC | PRN
  Administered 2018-01-11 (×3): 5 mg via ORAL
  Filled 2018-01-10 (×3): qty 1

## 2018-01-10 MED ORDER — APIXABAN 5 MG PO TABS
5.0000 mg | ORAL_TABLET | Freq: Two times a day (BID) | ORAL | Status: DC
  Administered 2018-01-10 – 2018-01-11 (×3): 5 mg via ORAL
  Filled 2018-01-10 (×3): qty 1

## 2018-01-10 MED ORDER — HEPARIN BOLUS VIA INFUSION
1800.0000 [IU] | Freq: Once | INTRAVENOUS | Status: AC
  Administered 2018-01-10: 1800 [IU] via INTRAVENOUS
  Filled 2018-01-10: qty 1800

## 2018-01-10 NOTE — Progress Notes (Signed)
*  PRELIMINARY RESULTS* Echocardiogram 2D Echocardiogram has been performed.  Richard [name] 01/10/2018, 2:23 PM

## 2018-01-10 NOTE — Progress Notes (Signed)
PHARMACY - PHYSICIAN COMMUNICATION CRITICAL VALUE ALERT - BLOOD CULTURE IDENTIFICATION (BCID)  Richard [name] is an 62 y.o. male who presented to [facility] on 01/09/2018 with a chief complaint of fever and dehydration.   Assessment:  Sepsis with unknown source of infection  Name of physician (or Provider) Contacted: Dr. Fritzi Mandes  Current antibiotics: Vancomycin and Zosyn   Changes to prescribed antibiotics recommended:  Discontinue vancomycin. Continue Zosyn until ID has seen patient.    Results for orders placed or performed during the hospital encounter of 01/09/18  Blood Culture ID Panel (Reflexed) (Collected: 01/09/2018  3:56 AM)  Result Value Ref Range   Enterococcus species NOT DETECTED NOT DETECTED   Listeria monocytogenes NOT DETECTED NOT DETECTED   Staphylococcus species DETECTED (A) NOT DETECTED   Staphylococcus aureus NOT DETECTED NOT DETECTED   Methicillin resistance NOT DETECTED NOT DETECTED   Streptococcus species NOT DETECTED NOT DETECTED   Streptococcus agalactiae NOT DETECTED NOT DETECTED   Streptococcus pneumoniae NOT DETECTED NOT DETECTED   Streptococcus pyogenes NOT DETECTED NOT DETECTED   Acinetobacter baumannii NOT DETECTED NOT DETECTED   Enterobacteriaceae species NOT DETECTED NOT DETECTED   Enterobacter cloacae complex NOT DETECTED NOT DETECTED   Escherichia coli NOT DETECTED NOT DETECTED   Klebsiella oxytoca NOT DETECTED NOT DETECTED   Klebsiella pneumoniae NOT DETECTED NOT DETECTED   Proteus species NOT DETECTED NOT DETECTED   Serratia marcescens NOT DETECTED NOT DETECTED   Haemophilus influenzae NOT DETECTED NOT DETECTED   Neisseria meningitidis NOT DETECTED NOT DETECTED   Pseudomonas aeruginosa NOT DETECTED NOT DETECTED   Candida albicans NOT DETECTED NOT DETECTED   Candida glabrata NOT DETECTED NOT DETECTED   Candida krusei NOT DETECTED NOT DETECTED   Candida parapsilosis NOT DETECTED NOT DETECTED   Candida tropicalis NOT DETECTED NOT DETECTED     Pernell Dupre, PharmD, BCPS Clinical Pharmacist 01/10/2018 1:11 PM

## 2018-01-10 NOTE — Progress Notes (Signed)
Initial Nutrition Assessment  DOCUMENTATION CODES:   Severe malnutrition in context of chronic illness  INTERVENTION:   Premier Protein BID, each supplement provides 160 kcal and 30 grams of protein.   MVI daily  Dysphagia 3 diet  NUTRITION DIAGNOSIS:   Severe Malnutrition related to cancer and cancer related treatments, other (see comment)(uncontrolled DM) as evidenced by moderate fat depletion, moderate to severe muscle depletions, 13 percent weight loss in 3 months.  GOAL:   Patient will meet greater than or equal to 90% of their needs  MONITOR:   Supplement acceptance, PO intake, Weight trends, Labs, I & O's  REASON FOR ASSESSMENT:   Other (Comment)(low BMI)    ASSESSMENT:   62 year old male with history of uncontrolled diabetes, coronary artery disease, atrial fibrillation, adenocarcinoma of the pancreas getting chemotherapy at [facility] and CHF presents to the emergency department via EMS due to high blood sugar.    Met with pt in room today. Pt reports intermittent poor appetite and oral intake pta. Pt reports that he does drink Glucerna sometimes. Pt ate 95% of his breakfast today. Pt reports ~100lb weight loss over the past few years. Per chart, pt weighed ~180lbs in 2016. Pt has lost 20lbs(13%) over the past 3 months; this is severe weight loss. Pt with uncontrolled diabetes; AIC 38. Pt with h/o adenocarcinoma s/p pancreatectomy. Pt denies any diarrhea/abnormal stools s/p pancreatectomy. Pt does not take any enzymes. Pt reports loose teeth and poor dentition and requires chopped meats at baseline. RD will order supplements and MVI. Change diet to dysphagia 3.   Medications reviewed and include: aspirin, colace, insulin, NaCl @125ml /hr, zosyn  Labs reviewed: Hgb 10.0(L), Hct 30.6(L) Glucose- 579 AIC 17.0(H)- 3/21  NUTRITION - FOCUSED PHYSICAL EXAM:    Most Recent Value  Orbital Region  Mild depletion  Upper Arm Region  Moderate depletion  Thoracic and Lumbar Region   Moderate depletion  Buccal Region  Mild depletion  Temple Region  Mild depletion  Clavicle Bone Region  Moderate depletion  Clavicle and Acromion Bone Region  Moderate depletion  Scapular Bone Region  Mild depletion  Dorsal Hand  Mild depletion  Patellar Region  Severe depletion  Anterior Thigh Region  Severe depletion  Posterior Calf Region  Severe depletion  Edema (RD Assessment)  None  Hair  Reviewed  Eyes  Reviewed  Mouth  Reviewed  Skin  Reviewed  Nails  Reviewed      Diet Order:  Diet heart healthy/carb modified Room service appropriate? Yes; Fluid consistency: Thin  EDUCATION NEEDS:   Education needs have been addressed  Skin:  Reviewed RN Assessment  Last BM:  3/21  Height:   Ht Readings from Last 1 Encounters:  01/09/18 6' (1.829 m)    Weight:   Wt Readings from Last 1 Encounters:  01/10/18 130 lb 6.4 oz (59.1 kg)    Ideal Body Weight:  80.9 kg  BMI:  Body mass index is 17.69 kg/m.  Estimated Nutritional Needs:   Kcal:  1900-2200kcal/day   Protein:  89-100g/day   Fluid:  >1.5L/day   Koleen Distance MS, RD, LDN Pager #[phone] After Hours Pager: [phone]

## 2018-01-10 NOTE — Progress Notes (Signed)
Inpatient Diabetes Program Recommendations  AACE/ADA: New Consensus Statement on Inpatient Glycemic Control (2015)  Target Ranges:  Prepandial:   less than 140 mg/dL      Peak postprandial:   less than 180 mg/dL (1-2 hours)      Critically ill patients:  140 - 180 mg/dL   Lab Results  Component Value Date   GLUCAP 190 (H) 01/10/2018   HGBA1C 17.0 (H) 01/09/2018    Review of Glycemic ControlResults for KABIR, BRANNOCK (MRN [id number]) as of 01/10/2018 14:10  Ref. Range 01/10/2018 07:47 01/10/2018 11:40  Glucose-Capillary Latest Ref Range: 65 - 99 mg/dL 83 190 (H)    Diabetes history: DM- insulin requiring after pancreatectomy Outpatient Diabetes medications: Basaglar 28 units daily, Humalog 4 units tid with meals Current orders for Inpatient glycemic control:  Lantus 22 units daily, Novolog moderate tid with meals   Inpatient Diabetes Program Recommendations:    Blood sugars improved with addition of insulin.  Spoke with patient regarding A1C and he states that he recently ran out of insulin.  I told him that A1C indicates average blood sugars 450 mg/dL.  He said he has not taken insulin for the past week or so.  I reminded patient that he has to take insulin daily and that he cannot skip a day.  Patient verbalized understanding however he needs reinforcement.   Unsure if patient is able to afford medication?  Although when I asked, he states that he needs a new Rx.  Will place care management consult to inquire about possible assistance for insulins? MD, at d/c patient will need new Rx's for insulin.    Thanks,  Adah Perl, RN, BC-ADM Inpatient Diabetes Coordinator Pager [phone] (8a-5p)

## 2018-01-10 NOTE — Progress Notes (Signed)
Patient sts tylenol did not work on his pain. On-call MD paged.

## 2018-01-10 NOTE — Progress Notes (Signed)
ANTIBIOTIC CONSULT NOTE - INITIAL  Pharmacy Consult for Unasyn  Indication: fever, S/P splenectomy  No Known Allergies  Patient Measurements: Height: 6' (182.9 cm) Weight: 130 lb 6.4 oz (59.1 kg) IBW/kg (Calculated) : 77.6 Adjusted Body Weight:   Vital Signs: Temp: 98.6 F (37 C) (03/22 2002) Temp Source: Oral (03/22 2002) BP: 123/70 (03/22 2002) Pulse Rate: 70 (03/22 2002) Intake/Output from previous day: 03/21 0701 - 03/22 0700 In: 2728 [P.O.:240; I.V.:1988; IV Piggyback:500] Out: 650 [Urine:650] Intake/Output from this shift: Total I/O In: -  Out: 150 [Urine:150]  Labs: Recent Labs    01/09/18 0347 01/09/18 0410 01/10/18 0440  WBC 10.2  --  8.1  HGB 12.4*  --  10.0*  PLT 334  --  249  CREATININE  --  1.00  --    Estimated Creatinine Clearance: 63.2 mL/min (by C-G formula based on SCr of 1 mg/dL). No results for input(s): VANCOTROUGH, VANCOPEAK, VANCORANDOM, GENTTROUGH, GENTPEAK, GENTRANDOM, TOBRATROUGH, TOBRAPEAK, TOBRARND, AMIKACINPEAK, AMIKACINTROU, AMIKACIN in the last 72 hours.   Microbiology: Recent Results (from the past 720 hour(s))  Culture, blood (Routine x 2)     Status: None (Preliminary result)   Collection Time: 01/09/18  3:56 AM  Result Value Ref Range Status   Specimen Description BLOOD RIGHT FORARM  Final   Special Requests   Final    BOTTLES DRAWN AEROBIC AND ANAEROBIC Blood Culture adequate volume   Culture   Final    NO GROWTH 1 DAY Performed at [facility], [address] [place], [place] [address]    Report Status PENDING  Incomplete  Culture, blood (Routine x 2)     Status: None (Preliminary result)   Collection Time: 01/09/18  3:56 AM  Result Value Ref Range Status   Specimen Description   Final    BLOOD LEFT AC Performed at [facility], [address] [place], [place] [address]    Special Requests   Final    BOTTLES DRAWN AEROBIC AND ANAEROBIC Blood Culture adequate volume Performed at [facility], [address] [place], [place] [address]    Culture  Setup Time   Final    GRAM POSITIVE COCCI ANAEROBIC BOTTLE ONLY CRITICAL RESULT CALLED TO, READ BACK BY AND VERIFIED WITH: [place] @1110  01/10/18 MGP Performed at [facility], [place] [address], [place], [place] [address]    Culture GRAM POSITIVE COCCI  Final   Report Status PENDING  Incomplete  Blood Culture ID Panel (Reflexed)     Status: Abnormal   Collection Time: 01/09/18  3:56 AM  Result Value Ref Range Status   Enterococcus species NOT DETECTED NOT DETECTED Final   Listeria monocytogenes NOT DETECTED NOT DETECTED Final   Staphylococcus species DETECTED (A) NOT DETECTED Final    Comment: Methicillin (oxacillin) susceptible coagulase negative staphylococcus. Possible blood culture contaminant (unless isolated from more than one blood culture draw or clinical case suggests pathogenicity). No antibiotic treatment is indicated for blood  culture contaminants. CRITICAL RESULT CALLED TO, READ BACK BY AND VERIFIED WITH: HANK ZOMPA @1110  01/10/18 MGP    Staphylococcus aureus NOT DETECTED NOT DETECTED Final   Methicillin resistance NOT DETECTED NOT DETECTED Final   Streptococcus species NOT DETECTED NOT DETECTED Final   Streptococcus agalactiae NOT DETECTED NOT DETECTED Final   Streptococcus pneumoniae NOT DETECTED NOT DETECTED Final   Streptococcus pyogenes NOT DETECTED NOT DETECTED Final   Acinetobacter baumannii NOT DETECTED NOT DETECTED Final   Enterobacteriaceae species NOT DETECTED NOT  DETECTED Final   Enterobacter cloacae complex NOT DETECTED NOT DETECTED Final   Escherichia coli NOT DETECTED NOT DETECTED Final   Klebsiella oxytoca NOT DETECTED NOT DETECTED Final   Klebsiella pneumoniae NOT DETECTED NOT DETECTED Final   Proteus species NOT DETECTED NOT DETECTED Final   Serratia marcescens NOT DETECTED NOT DETECTED Final   Haemophilus influenzae NOT DETECTED NOT DETECTED Final   Neisseria meningitidis NOT  DETECTED NOT DETECTED Final   Pseudomonas aeruginosa NOT DETECTED NOT DETECTED Final   Candida albicans NOT DETECTED NOT DETECTED Final   Candida glabrata NOT DETECTED NOT DETECTED Final   Candida krusei NOT DETECTED NOT DETECTED Final   Candida parapsilosis NOT DETECTED NOT DETECTED Final   Candida tropicalis NOT DETECTED NOT DETECTED Final    Comment: Performed at [facility], [address], [place], [place] [address]  Urine culture     Status: Abnormal   Collection Time: 01/09/18  5:16 AM  Result Value Ref Range Status   Specimen Description   Final    URINE, RANDOM Performed at [facility], [address], [place], [place] [address]    Special Requests   Final    NONE Performed at [facility], [address] [place], [place] [address]    Culture (A)  Final    <10,000 COLONIES/mL INSIGNIFICANT GROWTH Performed at [facility] [address], [place], [place] [address]    Report Status 01/10/2018 FINAL  Final    Medical History: Past Medical History:  Diagnosis Date  . Atrial fibrillation ([place])    per patient  . Cancer ([place])   . CHF (congestive heart failure) ([place])   . Chronic back pain   . Chronic leg pain   . Coronary artery disease   . Diabetes mellitus without complication ([place])   . Hypertension   . Neuropathy     Medications:  Medications Prior to Admission  Medication Sig Dispense Refill Last Dose  . ACCU-CHEK AVIVA PLUS test strip U ONCE TO BID UTD  0 UTD at UTD  . ACCU-CHEK SOFTCLIX LANCETS lancets TEST 1-2 XD  0 UTD at UTD  . atorvastatin (LIPITOR) 40 MG tablet Take 40 mg by mouth every evening.   N/A at N/A  . Blood Glucose Monitoring Suppl (ACCU-CHEK AVIVA PLUS) w/Device KIT U TO TEST ONCE TO BID  0 UTD at UTD  . GLOBAL EASE INJECT PEN NEEDLES 31G X 5 MM MISC Inject 1 Units into the skin daily. 100 each 0 UTD at UTD  . Insulin Glargine (BASAGLAR KWIKPEN) 100 UNIT/ML SOPN Inject 0.28 mLs (28 Units total) into the  skin at bedtime. 12 mL 0 N/A at N/A  . insulin lispro (HUMALOG KWIKPEN) 100 UNIT/ML KiwkPen Inject 0.04 mLs (4 Units total) into the skin 3 (three) times daily with meals. 12 mL 0 01/08/2018 at Unknown time  . Needles & Syringes MISC Use as directed 100 each 3 UTD at UTD  . TRULICITY 5.62 BW/3.8LH SOPN Inject 0.75 mg into the skin once a week.   1 N/A at N/A  . VOLTAREN 1 % GEL Apply 2 g topically 4 (four) times daily.   0 N/A at N/A  . ferrous sulfate 325 (65 FE) MG EC tablet Take 1 tablet (325 mg total) by mouth 2 (two) times daily. (Patient not taking: Reported on 01/10/2018) 60 tablet 0 Not Taking at --   Assessment: CrCl = 63.2 ml/min   Goal of Therapy:  resolution of infection  Plan:  Expected duration 7 days with resolution of temperature and/or normalization of WBC   Unasyn 3 gm IV Q6H ordered to start on 3/22.   Schon Zeiders D 01/10/2018,10:03 PM

## 2018-01-10 NOTE — Progress Notes (Signed)
ANTICOAGULATION CONSULT NOTE - Initial Consult  Pharmacy Consult for heparin Indication: chest pain/ACS  No Known Allergies  Patient Measurements: Height: 6' (182.9 cm) Weight: 130 lb 6.4 oz (59.1 kg) IBW/kg (Calculated) : 77.6 Heparin Dosing Weight: 59 kg  Vital Signs: Temp: 97.7 F (36.5 C) (03/22 0500) Temp Source: Oral (03/22 0500) BP: 110/62 (03/22 0500) Pulse Rate: 82 (03/22 0500)  Labs: Recent Labs    01/09/18 0347 01/09/18 0410  01/09/18 0828  01/09/18 1816 01/10/18 0440 01/10/18 0447  HGB 12.4*  --   --   --   --   --  10.0*  --   HCT 38.2*  --   --   --   --   --  30.6*  --   PLT 334  --   --   --   --   --  249  --   LABPROT  --   --   --  14.1  --   --   --   --   INR  --   --   --  1.10  --   --   --   --   HEPARINUNFRC  --   --   --   --   --   --  <0.10*  --   CREATININE  --  1.00  --   --   --   --   --   --   TROPONINI  --   --    < >  --    < > 1.51* 0.78* 1.04*   < > = values in this interval not displayed.    Estimated Creatinine Clearance: 63.2 mL/min (by C-G formula based on SCr of 1 mg/dL).   Medical History: Past Medical History:  Diagnosis Date  . Atrial fibrillation ([place])    per patient  . Cancer ([place])   . CHF (congestive heart failure) ([place])   . Chronic back pain   . Chronic leg pain   . Coronary artery disease   . Diabetes mellitus without complication ([place])   . Hypertension   . Neuropathy    Assessment: Pharmacy consulted to dose and monitor heparin in this 62 year old male for ACS. Patient not on any anticoagulants per med rec and discussion with MD. Baseline labs ordered.  Goal of Therapy:  Heparin level 0.3-0.7 units/ml Monitor platelets by anticoagulation protocol: Yes   Plan:  Give 3400 units bolus x 1 Start heparin infusion at 700 units/hr Check anti-Xa level in 6 hours and daily while on heparin Continue to monitor H&H and platelets   03/22 AM heparin level <0.1. 1800 unit bolus and increase rate to 950  units/hr. Recheck in 6 hours.  Eloise Harman, PharmD, BCPS Clinical Pharmacist 01/10/2018,6:57 AM

## 2018-01-10 NOTE — Consult Note (Signed)
ANTICOAGULATION CONSULT NOTE - Initial Consult  Pharmacy Consult for apixaban Indication: atrial fibrillation  No Known Allergies  Patient Measurements: Height: 6' (182.9 cm) Weight: 130 lb 6.4 oz (59.1 kg) IBW/kg (Calculated) : 77.6 Heparin Dosing Weight:   Vital Signs: Temp: 98.5 F (36.9 C) (03/22 1000) Temp Source: Oral (03/22 1000) BP: 103/50 (03/22 1000) Pulse Rate: 64 (03/22 1000)  Labs: Recent Labs    01/09/18 0347 01/09/18 0410  01/09/18 0828  01/09/18 1816 01/10/18 0440 01/10/18 0447  HGB 12.4*  --   --   --   --   --  10.0*  --   HCT 38.2*  --   --   --   --   --  30.6*  --   PLT 334  --   --   --   --   --  249  --   LABPROT  --   --   --  14.1  --   --   --   --   INR  --   --   --  1.10  --   --   --   --   HEPARINUNFRC  --   --   --   --   --   --  <0.10*  --   CREATININE  --  1.00  --   --   --   --   --   --   TROPONINI  --   --    < >  --    < > 1.51* 0.78* 1.04*   < > = values in this interval not displayed.    Estimated Creatinine Clearance: 63.2 mL/min (by C-G formula based on SCr of 1 mg/dL).   Medical History: Past Medical History:  Diagnosis Date  . Atrial fibrillation ([place])    per patient  . Cancer ([place])   . CHF (congestive heart failure) ([place])   . Chronic back pain   . Chronic leg pain   . Coronary artery disease   . Diabetes mellitus without complication ([place])   . Hypertension   . Neuropathy     Medications:  Scheduled:  . amiodarone  200 mg Oral Daily  . apixaban  5 mg Oral BID  . aspirin  81 mg Oral Daily  . docusate sodium  100 mg Oral BID  . insulin aspart  0-15 Units Subcutaneous TID WC  . insulin glargine  22 Units Subcutaneous Daily  . metoprolol tartrate  25 mg Oral BID    Assessment: Patient is a 62 year old male with a hx of afib, on apixban previously. Pt stopped apixaban in Nov 2018 due to GI bleed. Hbg currently stable. Pt on heparin drip currently. Per Dr. Posey Pronto restart apixaban. Dr. Humphrey Rolls aware of previous  GI bleed, would like pt to take for 1 month and then possible cardioversion.   Goal of Therapy:   Monitor platelets by anticoagulation protocol: Yes   Plan:  apixaban 5mg  BID  Jamella Grayer D Daishia Fetterly, Pharm.D, BCPS Clinical Pharmacist  01/10/2018,10:36 AM

## 2018-01-10 NOTE — Care Management Important Message (Signed)
Important Message  Patient Details  Name: Richard [name] MRN: [id number] Date of Birth: 1955/11/05   Medicare Important Message Given:  Yes Signed IM notice given   Katrina Stack, RN 01/10/2018, 6:37 PM

## 2018-01-10 NOTE — Progress Notes (Signed)
[place] [facility] [place] NAME: Richard [name]    MR#:  [id number]  DATE OF BIRTH:  Nov 13, 1955  SUBJECTIVE:  Patient came in with high-grade fever of 102 and significant dehydration.  Patient afebrile.  He does not remember coming to the hospital.  He is more awake alert oriented x3.  Able to eat.  No complaints today. REVIEW OF SYSTEMS:   Review of Systems  Constitutional: Negative for chills, fever and weight loss.  HENT: Negative for ear discharge, ear pain and nosebleeds.   Eyes: Negative for blurred vision, pain and discharge.  Respiratory: Negative for sputum production, shortness of breath, wheezing and stridor.   Cardiovascular: Negative for chest pain, palpitations, orthopnea and PND.  Gastrointestinal: Negative for abdominal pain, diarrhea, nausea and vomiting.  Genitourinary: Negative for frequency and urgency.  Musculoskeletal: Negative for back pain and joint pain.  Neurological: Negative for sensory change, speech change, focal weakness and weakness.  Psychiatric/Behavioral: Negative for depression and hallucinations. The patient is not nervous/anxious.      DRUG ALLERGIES:  No Known Allergies  VITALS:  Blood pressure (!) 103/50, pulse 64, temperature 98.5 F (36.9 C), temperature source Oral, resp. rate 18, height 6' (1.829 m), weight 59.1 kg (130 lb 6.4 oz), SpO2 100 %.  PHYSICAL EXAMINATION:   Physical Exam  GENERAL:  62 y.o.-year-old patient lying in the bed with no acute distress.  EYES: Pupils equal, round, reactive to light and accommodation. No scleral icterus. Extraocular muscles intact.  HEENT: Head atraumatic, normocephalic. Oropharynx and nasopharynx dry with chapped lips NECK:  Supple, no jugular venous distention. No thyroid enlargement, no tenderness.  LUNGS: Normal breath sounds bilaterally, no wheezing, rales, rhonchi. No use of accessory muscles of respiration.  CARDIOVASCULAR: S1, S2 normal. No  murmurs, rubs, or gallops.  Tachycardia ABDOMEN: Soft, nontender, nondistended. Bowel sounds present. No organomegaly or mass.  EXTREMITIES: No cyanosis, clubbing or edema b/l.   Right transmetatarsal amputation site looks okay.  No cellulitis. NEUROLOGIC grossly nonfocal.  Cranial nerves intact. PSYCHIATRIC:  patient is alert and oriented x3 SKIN: No obvious rash, lesion, or ulcer.   LABORATORY PANEL:  CBC Recent Labs  Lab 01/10/18 0440  WBC 8.1  HGB 10.0*  HCT 30.6*  PLT 249    Chemistries  Recent Labs  Lab 01/09/18 0410 01/09/18 0430  NA 136  --   K 4.0  --   CL 98*  --   CO2 24  --   GLUCOSE 579*  --   BUN 14  --   CREATININE 1.00  --   CALCIUM 9.1  --   AST  --  33  ALT  --  23  ALKPHOS  --  24*  BILITOT  --  0.9   Cardiac Enzymes Recent Labs  Lab 01/10/18 0447  TROPONINI 1.04*   RADIOLOGY:  Dg Chest 1 View  Result Date: 01/09/2018 CLINICAL DATA:  Sepsis.  Tachycardia and fever. EXAM: CHEST  1 VIEW COMPARISON:  Radiograph 09/05/2017 FINDINGS: The cardiomediastinal contours are normal. The lungs are clear. Pulmonary vasculature is normal. No consolidation, pleural effusion, or pneumothorax. No acute osseous abnormalities are seen. IMPRESSION: No active disease. Electronically Signed   By: Jeb Levering M.D.   On: 01/09/2018 04:28   Dg Foot Complete Right  Result Date: 01/09/2018 CLINICAL DATA:  Initial evaluation for possible osteomyelitis. EXAM: RIGHT FOOT COMPLETE - 3+ VIEW COMPARISON:  Prior radiograph from 11/03/2015. FINDINGS: Patient status post amputation of the  right forefoot through the proximal shafts of the metatarsals. Amputated margins appear sharply demarcated without radiographic evidence for osteomyelitis. Overlying soft tissue irregularity may reflect ulceration. No abnormal soft tissue emphysema. Prominent vascular calcifications noted within the lower leg and foot. No acute fracture or dislocation. IMPRESSION: 1. Sequelae of prior amputation  of the right forefoot. No radiographic evidence for osteomyelitis. 2. No other acute osseous abnormality. Electronically Signed   By: Jeannine Boga M.D.   On: 01/09/2018 05:59   ASSESSMENT AND PLAN:   Richard [name] is a 62 year old male with history of uncontrolled diabetes, coronary artery disease, atrial fibrillation, adenocarcinoma of the pancreas getting chemotherapy at [facility] and CHF presents to the emergency department via EMS due to high blood sugar.  The patient reported his glucometer is reading "high".  Further details are difficult to gather as the patient was confused upon arrival to the emergency department, although he was able to follow commands   1.  Acute febrile illness/sepsis -Source remains unknown--he has viral syndrome -Patient has right metatarsal amputation skin breakdown present however no significant discharge or signs of cellulitis noted.  X-ray of the right foot negative for osteomyelitis-UA and chest x-ray clear -Blood culture so far negative -Influenza A negative -Continue broad-spectrum antibiotic with Vanco and Zosyn--now on Zosyn. -Out of 4 blood culture positive for gram-positive cocci--it could be contamination. -Urine culture positive for gram-negative rods  2.  Elevated troponin/acute NST EMI/demand ischemia in the setting of #1 -Patient has history of coronary artery disease--details not available at present --Patient had cardiac cath in 2010 at [facility] unable to obtain results results -Patient seen by Dr. Yancey Flemings cardiology.  He recommends DC heparin drip.  Start patient on Eliquis for anticoagulation and further workup as outpatient by getting a CT angiogram in Dr. Sharol Roussel office -This will be done as outpatient.  No further recommendations per cardiology  3.  Uncontrolled diabetes -A1c is 17 -Diabetes coordinator consultation appreciated--follow recommendations  4.  Acute renal failure with clinical dehydration -Continue IV fluids -He had been  stable  5.  History of atrial fibrillation -Patient currently on amiodarone and metoprolol -Start patient on Eliquis per cardiology recommendation and continue other cardiac meds once medication reconciliation is done.  Patient will follow-up with cardiology office as an outpatient for further workup.  Dr. Yancey Flemings recommends CT angiogram of the chest/heart next week in his office  6.  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  No family members present Case discussed with Care Management/Social Worker. Management plans discussed with the patient, family and they are in agreement.  CODE STATUS: Full  DVT Prophylaxis: Eliquis  TOTAL TIME TAKING CARE OF THIS PATIENT: 30 minutes.  >50% time spent on counselling and coordination of care  POSSIBLE D/C IN *2-3* DAYS, DEPENDING ON CLINICAL CONDITION.  Note: This dictation was prepared with Dragon dictation along with smaller phrase technology. Any transcriptional errors that result from this process are unintentional.  Fritzi Mandes M.D on 01/10/2018 at 2:20 PM  Between 7am to 6pm - Pager - [phone]  After 6pm go to [email or web address] - password [email or web address] [facility]  Office  [phone]  CC: Primary care physician; [facility] HealthPatient ID: Richard [name], male   DOB: [birth year], 62 y.o.   MRN: [id number]

## 2018-01-10 NOTE — Progress Notes (Signed)
ECHO shows Mild decreased LV systolic dysfunction with LVEF 45% and diffuse hypokinesis. NO vegetation. Patient has sinus tachycardia with elevated troponin due to demand ischaemia. Once hyperglycemia and febrile illness due to sepsis VS Viral syndrome is resolved, can come to my office ideally Monday at 10 AM and can do CTA coronaries .

## 2018-01-10 NOTE — Consult Note (Signed)
[facility] Infectious Disease     Reason for Consult:Fever, [name] bacteremai    Referring Physician: Nicholes Mango  Date of Admission:  01/09/2018   Active Problems:   Sepsis ([place])   Protein-calorie malnutrition, severe   HPI: Richard [name] is a 62 y.o. male admitted with AMS, fever and hyperglycemia. ON admit wbc was 10 and temp 102.9.  Started vanco and zosyn. BCx + for CNS. UCX neg. Procalcitonin < 0.1.  He has DM A1c 17.0 Flu PCR negative.  UA negative. CXR neg,  He has a R metatarsal amputation site with some skin breakdown but no obvius signs of infection. Foot Xray neg for osteomyelitis.  Also has hx pancreatic cancer status post resection of his pancreas 07/2017 and currently getting adjuvant chemotherapy at [facility].  Also s.p splenectomy.  Since admission has been afebrile. Improving. Denies abd pain, nvd. Denies cough. Does co some L foot pain.   Past Medical History:  Diagnosis Date  . Atrial fibrillation ([place])    per patient  . Cancer ([place])   . CHF (congestive heart failure) ([place])   . Chronic back pain   . Chronic leg pain   . Coronary artery disease   . Diabetes mellitus without complication ([place])   . Hypertension   . Neuropathy    Past Surgical History:  Procedure Laterality Date  . CORONARY ANGIOPLASTY WITH STENT PLACEMENT    . ESOPHAGOGASTRODUODENOSCOPY N/A 09/11/2017   Procedure: ESOPHAGOGASTRODUODENOSCOPY (EGD);  Surgeon: Virgel Manifold, MD;  Location: [facility] ENDOSCOPY;  Service: Endoscopy;  Laterality: N/A;  . pancreas removed     Social History   Tobacco Use  . Smoking status: Former Smoker    Packs/day: 0.50  . Smokeless tobacco: Never Used  Substance Use Topics  . Alcohol use: No    Alcohol/week: 0.0 oz  . 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 CAD Mother   . Diabetes Mellitus II Father     Allergies: No Known Allergies  Current antibiotics: Antibiotics  Given (last 72 hours)    Date/Time Action Medication Dose Rate   01/09/18 0420 New Bag/Given   piperacillin-tazobactam (ZOSYN) IVPB 3.375 g 3.375 g 100 mL/hr   01/09/18 0423 New Bag/Given   vancomycin (VANCOCIN) IVPB 1000 mg/200 mL premix 1,000 mg 200 mL/hr   01/09/18 1108 New Bag/Given   piperacillin-tazobactam (ZOSYN) IVPB 3.375 g 3.375 g 12.5 mL/hr   01/09/18 1400 New Bag/Given   vancomycin (VANCOCIN) IVPB 1000 mg/200 mL premix 1,000 mg 200 mL/hr   01/09/18 1756 New Bag/Given   piperacillin-tazobactam (ZOSYN) IVPB 3.375 g 3.375 g 12.5 mL/hr   01/10/18 0105 New Bag/Given   vancomycin (VANCOCIN) IVPB 1000 mg/200 mL premix 1,000 mg 200 mL/hr   01/10/18 0222 New Bag/Given   piperacillin-tazobactam (ZOSYN) IVPB 3.375 g 3.375 g 12.5 mL/hr   01/10/18 1013 New Bag/Given   piperacillin-tazobactam (ZOSYN) IVPB 3.375 g 3.375 g 12.5 mL/hr   01/10/18 1136 New Bag/Given   vancomycin (VANCOCIN) IVPB 1000 mg/200 mL premix 1,000 mg 200 mL/hr      MEDICATIONS: . amiodarone  200 mg Oral Daily  . apixaban  5 mg Oral BID  . aspirin  81 mg Oral Daily  . docusate sodium  100 mg Oral BID  . insulin aspart  0-15 Units Subcutaneous TID WC  . insulin glargine  22 Units Subcutaneous Daily  . metoprolol tartrate  25 mg Oral BID  . multivitamin with minerals  1 tablet Oral Daily  . protein supplement shake  11 oz Oral BID BM    Review of Systems - 11 systems reviewed and negative per HPI   OBJECTIVE: Temp:  [97.6 F (36.4 C)-99.8 F (37.7 C)] 98 F (36.7 C) (03/22 1520) Pulse Rate:  [64-82] 75 (03/22 1520) Resp:  [17-18] 18 (03/22 1520) BP: (90-124)/(49-72) 90/67 (03/22 1520) SpO2:  [97 %-100 %] 99 % (03/22 1520) Weight:  [59.1 kg (130 lb 6.4 oz)] 59.1 kg (130 lb 6.4 oz) (03/22 0500) Physical Exam  Constitutional: He is oriented to person, place, and time.thin HENT: anicteric Mouth/Throat: Oropharynx is clear and moist. No oropharyngeal exudate.  Cardiovascular: Normal rate, regular rhythm  and normal heart sounds. 2.6 sm  Pulmonary/Chest: Effort normal and breath sounds normal. No respiratory distress. He has no wheezes.  Abdominal: Soft. Bowel sounds are normal. He exhibits no distension. There is no tenderness.  Lymphadenopathy: He has no cervical adenopathy.  Ext No CCE, wwp Neurological: He is alert and oriented to person, place, and time.  Skin: Skin is warm and dry. No rash noted. No erythema.  R foot amputation site well healed  Psychiatric: He has a normal mood and affect. His behavior is normal.     LABS: Results for orders placed or performed during the hospital encounter of 01/09/18 (from the past 48 hour(s))  Glucose, capillary     Status: Abnormal   Collection Time: 01/09/18  3:42 AM  Result Value Ref Range   Glucose-Capillary 555 (HH) 65 - 99 mg/dL   Comment 1 Document in Chart   CBC     Status: Abnormal   Collection Time: 01/09/18  3:47 AM  Result Value Ref Range   WBC 10.2 3.8 - 10.6 K/uL   RBC 4.32 (L) 4.40 - 5.90 MIL/uL   Hemoglobin 12.4 (L) 13.0 - 18.0 g/dL   HCT 38.2 (L) 40.0 - 52.0 %   MCV 88.5 80.0 - 100.0 fL   MCH 28.7 26.0 - 34.0 pg   MCHC 32.4 32.0 - 36.0 g/dL   RDW 15.1 (H) 11.5 - 14.5 %   Platelets 334 150 - 440 K/uL    Comment: Performed at [facility], [address] [place], [place] [address]  Lactic acid, plasma     Status: Abnormal   Collection Time: 01/09/18  3:56 AM  Result Value Ref Range   Lactic Acid, Venous 3.4 (HH) 0.5 - 1.9 mmol/L    Comment: CRITICAL RESULT CALLED TO, READ BACK BY AND VERIFIED WITH BUSTCH WOODS ON 01/09/18 AT Alvan JAG Performed at [facility], [address] [place], [place] [address]   Culture, blood (Routine x 2)     Status: None (Preliminary result)   Collection Time: 01/09/18  3:56 AM  Result Value Ref Range   Specimen Description BLOOD RIGHT FORARM    Special Requests      BOTTLES DRAWN AEROBIC AND ANAEROBIC Blood Culture adequate volume   Culture      NO GROWTH 1  DAY Performed at [facility], [address], [place], [place] [address]    Report Status PENDING   Culture, blood (Routine x 2)     Status: None (Preliminary result)   Collection Time: 01/09/18  3:56 AM  Result Value Ref Range   Specimen Description BLOOD LEFT AC    Special Requests      BOTTLES DRAWN AEROBIC AND ANAEROBIC Blood Culture adequate  volume   Culture  Setup Time      Organism ID to follow GRAM POSITIVE COCCI ANAEROBIC BOTTLE ONLY CRITICAL RESULT CALLED TO, READ BACK BY AND VERIFIED WITH: HANK ZOMPA @1110  01/10/18 MGP Performed at [facility], [address] [place], [place] [address]    Culture GRAM POSITIVE COCCI    Report Status PENDING   Blood Culture ID Panel (Reflexed)     Status: Abnormal   Collection Time: 01/09/18  3:56 AM  Result Value Ref Range   Enterococcus species NOT DETECTED NOT DETECTED   Listeria monocytogenes NOT DETECTED NOT DETECTED   Staphylococcus species DETECTED (A) NOT DETECTED    Comment: Methicillin (oxacillin) susceptible coagulase negative staphylococcus. Possible blood culture contaminant (unless isolated from more than one blood culture draw or clinical case suggests pathogenicity). No antibiotic treatment is indicated for blood  culture contaminants. CRITICAL RESULT CALLED TO, READ BACK BY AND VERIFIED WITH: HANK ZOMPA @1110  01/10/18 MGP    Staphylococcus aureus NOT DETECTED NOT DETECTED   Methicillin resistance NOT DETECTED NOT DETECTED   Streptococcus species NOT DETECTED NOT DETECTED   Streptococcus agalactiae NOT DETECTED NOT DETECTED   Streptococcus pneumoniae NOT DETECTED NOT DETECTED   Streptococcus pyogenes NOT DETECTED NOT DETECTED   Acinetobacter baumannii NOT DETECTED NOT DETECTED   Enterobacteriaceae species NOT DETECTED NOT DETECTED   Enterobacter cloacae complex NOT DETECTED NOT DETECTED   Escherichia coli NOT DETECTED NOT DETECTED   Klebsiella oxytoca NOT DETECTED NOT DETECTED   Klebsiella  pneumoniae NOT DETECTED NOT DETECTED   Proteus species NOT DETECTED NOT DETECTED   Serratia marcescens NOT DETECTED NOT DETECTED   Haemophilus influenzae NOT DETECTED NOT DETECTED   Neisseria meningitidis NOT DETECTED NOT DETECTED   Pseudomonas aeruginosa NOT DETECTED NOT DETECTED   Candida albicans NOT DETECTED NOT DETECTED   Candida glabrata NOT DETECTED NOT DETECTED   Candida krusei NOT DETECTED NOT DETECTED   Candida parapsilosis NOT DETECTED NOT DETECTED   Candida tropicalis NOT DETECTED NOT DETECTED    Comment: Performed at [facility], [address] [place], [place] [address]  Brain natriuretic peptide - IF patient is dyspneic     Status: None   Collection Time: 01/09/18  4:04 AM  Result Value Ref Range   B Natriuretic Peptide 53.0 0.0 - 100.0 pg/mL    Comment: Performed at [facility], [address] [place], [place] [address]  Basic metabolic panel     Status: Abnormal   Collection Time: 01/09/18  4:10 AM  Result Value Ref Range   Sodium 136 135 - 145 mmol/L   Potassium 4.0 3.5 - 5.1 mmol/L   Chloride 98 (L) 101 - 111 mmol/L   CO2 24 22 - 32 mmol/L   Glucose, Bld 579 (HH) 65 - 99 mg/dL    Comment: CRITICAL RESULT CALLED TO, READ BACK BY AND VERIFIED WITH  BUTCH WOODS AT 0540 01/09/18 SDR    BUN 14 6 - 20 mg/dL   Creatinine, Ser 1.00 0.61 - 1.24 mg/dL   Calcium 9.1 8.9 - 10.3 mg/dL   GFR calc non Af Amer >60 >60 mL/min   GFR calc Af Amer >60 >60 mL/min    Comment: (NOTE) The eGFR has been calculated using the CKD EPI equation. This calculation has not been validated in all clinical situations. eGFR's persistently <60 mL/min signify possible Chronic Kidney Disease.    Anion gap 14 5 - 15    Comment: Performed at [facility], [address] [place], [place] [address]  Hepatic function panel     Status: Abnormal   Collection Time: 01/09/18  4:30 AM  Result Value Ref Range   Total Protein 7.6 6.5 - 8.1 g/dL   Albumin 4.2 3.5 - 5.0  g/dL   AST 33 15 - 41 U/L   ALT 23 17 - 63 U/L   Alkaline Phosphatase 24 (L) 38 - 126 U/L   Total Bilirubin 0.9 0.3 - 1.2 mg/dL   Bilirubin, Direct 0.1 0.1 - 0.5 mg/dL   Indirect Bilirubin 0.8 0.3 - 0.9 mg/dL    Comment: Performed at [facility], [address] [place], [place] [address]  Beta-hydroxybutyric acid     Status: Abnormal   Collection Time: 01/09/18  4:30 AM  Result Value Ref Range   Beta-Hydroxybutyric Acid 1.80 (H) 0.05 - 0.27 mmol/L    Comment: Performed at [facility], [address] [place], [place] [address]  Influenza panel by PCR (type A & B)     Status: None   Collection Time: 01/09/18  4:57 AM  Result Value Ref Range   Influenza A By PCR NEGATIVE NEGATIVE   Influenza B By PCR NEGATIVE NEGATIVE    Comment: (NOTE) The Xpert Xpress Flu assay is intended as an aid in the diagnosis of  influenza and should not be used as a sole basis for treatment.  This  assay is FDA approved for nasopharyngeal swab specimens only. Nasal  washings and aspirates are unacceptable for Xpert Xpress Flu testing. Performed at [facility], [address] [place], [place] [address]   Urinalysis, Complete w Microscopic     Status: Abnormal   Collection Time: 01/09/18  5:16 AM  Result Value Ref Range   Color, Urine STRAW (A) YELLOW   APPearance CLEAR (A) CLEAR   Specific Gravity, Urine 1.028 1.005 - 1.030   pH 7.0 5.0 - 8.0   Glucose, UA >=500 (A) NEGATIVE mg/dL   Hgb urine dipstick SMALL (A) NEGATIVE   Bilirubin Urine NEGATIVE NEGATIVE   Ketones, ur 20 (A) NEGATIVE mg/dL   Protein, ur NEGATIVE NEGATIVE mg/dL   Nitrite NEGATIVE NEGATIVE   Leukocytes, UA NEGATIVE NEGATIVE   RBC / HPF NONE SEEN 0 - 5 RBC/hpf   WBC, UA NONE SEEN 0 - 5 WBC/hpf   Bacteria, UA RARE (A) NONE SEEN   Squamous Epithelial / LPF 0-5 (A) NONE SEEN    Comment: Performed at [facility], [address] [place], [place] [address]  Urine culture     Status: Abnormal    Collection Time: 01/09/18  5:16 AM  Result Value Ref Range   Specimen Description      URINE, RANDOM Performed at [facility], [address], [place], [place] [address]    Special Requests      NONE Performed at [facility], [place],  [address]    Culture (A)     <10,000 COLONIES/mL INSIGNIFICANT GROWTH Performed at [place] [address], [place],  [address]    Report Status 01/10/2018 FINAL   Lactic acid, plasma     Status: Abnormal   Collection Time: 01/09/18  6:17 AM  Result Value Ref Range   Lactic Acid, Venous 2.2 (HH) 0.5 - 1.9 mmol/L    Comment: CRITICAL RESULT CALLED TO, READ BACK BY AND VERIFIED WITH BUTCH WOODS ON 01/09/18 AT 0654 QSD Performed at [facility], [address] [place],  [place] [address]   Lipase, blood     Status: None   Collection Time: 01/09/18  7:58 AM  Result Value Ref Range   Lipase 31 11 - 51 U/L    Comment: Performed at [facility], [address] [place], [place] [address]  Troponin I     Status: Abnormal   Collection Time: 01/09/18  7:58 AM  Result Value Ref Range   Troponin I 0.58 (HH) <0.03 ng/mL    Comment: CRITICAL RESULT CALLED TO, READ BACK BY AND VERIFIED WITH ALLY RILEY AT 0829 01/09/18 DAS Performed at [facility], [address] [place], [place] [address]   Procalcitonin     Status: None   Collection Time: 01/09/18  7:58 AM  Result Value Ref Range   Procalcitonin <0.10 ng/mL    Comment:        Interpretation: PCT (Procalcitonin) <= 0.5 ng/mL: Systemic infection (sepsis) is not likely. Local bacterial infection is possible. (NOTE)       Sepsis PCT Algorithm           Lower Respiratory Tract                                      Infection PCT Algorithm    ----------------------------     ----------------------------         PCT < 0.25 ng/mL                PCT < 0.10 ng/mL         Strongly encourage             Strongly  discourage   discontinuation of antibiotics    initiation of antibiotics    ----------------------------     -----------------------------       PCT 0.25 - 0.50 ng/mL            PCT 0.10 - 0.25 ng/mL               OR       >80% decrease in PCT            Discourage initiation of                                            antibiotics      Encourage discontinuation           of antibiotics    ----------------------------     -----------------------------         PCT >= 0.50 ng/mL              PCT 0.26 - 0.50 ng/mL               AND        <80% decrease in PCT             Encourage initiation of                                             antibiotics       Encourage continuation           of antibiotics    ----------------------------     -----------------------------  PCT >= 0.50 ng/mL                  PCT > 0.50 ng/mL               AND         increase in PCT                  Strongly encourage                                      initiation of antibiotics    Strongly encourage escalation           of antibiotics                                     -----------------------------                                           PCT <= 0.25 ng/mL                                                 OR                                        > 80% decrease in PCT                                     Discontinue / Do not initiate                                             antibiotics Performed at [facility], [address] [place], [place] [address]   Protime-INR     Status: None   Collection Time: 01/09/18  8:28 AM  Result Value Ref Range   Prothrombin Time 14.1 11.4 - 15.2 seconds   INR 1.10     Comment: Performed at [facility], [address] [place], [place] [address]  TSH     Status: Abnormal   Collection Time: 01/09/18  8:28 AM  Result Value Ref Range   TSH 0.321 (L) 0.350 - 4.500 uIU/mL    Comment: Performed by a 3rd Generation assay with a functional  sensitivity of <=0.01 uIU/mL. Performed at [facility], [address] [place], [place] [address]   Hemoglobin A1c     Status: Abnormal   Collection Time: 01/09/18  8:28 AM  Result Value Ref Range   Hgb A1c MFr Bld 17.0 (H) 4.8 - 5.6 %    Comment: (NOTE) Pre diabetes:          5.7%-6.4% Diabetes:              >6.4% Glycemic control for   <7.0% adults with diabetes    Mean Plasma Glucose 441.2 mg/dL  Comment: Performed at [facility], [address] [place], [place] [address]  Glucose, capillary     Status: Abnormal   Collection Time: 01/09/18  9:37 AM  Result Value Ref Range   Glucose-Capillary 409 (H) 65 - 99 mg/dL  Glucose, capillary     Status: Abnormal   Collection Time: 01/09/18 12:00 PM  Result Value Ref Range   Glucose-Capillary 356 (H) 65 - 99 mg/dL  Lactic acid, plasma     Status: None   Collection Time: 01/09/18  2:45 PM  Result Value Ref Range   Lactic Acid, Venous 1.7 0.5 - 1.9 mmol/L    Comment: Performed at [facility], [address] [place], [place] [address]  Troponin I     Status: Abnormal   Collection Time: 01/09/18  2:45 PM  Result Value Ref Range   Troponin I 1.60 (HH) <0.03 ng/mL    Comment: CRITICAL VALUE NOTED. VALUE IS CONSISTENT WITH PREVIOUSLY REPORTED/CALLED VALUE DAS Performed at [facility], [address] [place], [place] [address]   Glucose, capillary     Status: Abnormal   Collection Time: 01/09/18  5:01 PM  Result Value Ref Range   Glucose-Capillary 205 (H) 65 - 99 mg/dL  Troponin I     Status: Abnormal   Collection Time: 01/09/18  6:16 PM  Result Value Ref Range   Troponin I 1.51 (HH) <0.03 ng/mL    Comment: CRITICAL VALUE NOTED. VALUE IS CONSISTENT WITH PREVIOUSLY REPORTED/CALLED VALUE.  TFK Performed at [facility], [address] [place], [place] [address]   Glucose, capillary     Status: Abnormal   Collection Time: 01/09/18 10:08 PM  Result Value Ref Range    Glucose-Capillary 116 (H) 65 - 99 mg/dL  Troponin I     Status: Abnormal   Collection Time: 01/10/18  4:40 AM  Result Value Ref Range   Troponin I 0.78 (HH) <0.03 ng/mL    Comment: CRITICAL VALUE NOTED. VALUE IS CONSISTENT WITH PREVIOUSLY REPORTED/CALLED VALUE. QSD Performed at [facility], [address] [place], [place] [address]   CBC     Status: Abnormal   Collection Time: 01/10/18  4:40 AM  Result Value Ref Range   WBC 8.1 3.8 - 10.6 K/uL   RBC 3.53 (L) 4.40 - 5.90 MIL/uL   Hemoglobin 10.0 (L) 13.0 - 18.0 g/dL   HCT 30.6 (L) 40.0 - 52.0 %   MCV 86.7 80.0 - 100.0 fL   MCH 28.3 26.0 - 34.0 pg   MCHC 32.7 32.0 - 36.0 g/dL   RDW 15.4 (H) 11.5 - 14.5 %   Platelets 249 150 - 440 K/uL    Comment: Performed at [facility], [place], [place] [address]  Heparin level (unfractionated)     Status: Abnormal   Collection Time: 01/10/18  4:40 AM  Result Value Ref Range   Heparin Unfractionated <0.10 (L) 0.30 - 0.70 IU/mL    Comment:        IF HEPARIN RESULTS ARE BELOW EXPECTED VALUES, AND PATIENT DOSAGE HAS BEEN CONFIRMED, SUGGEST FOLLOW UP TESTING OF ANTITHROMBIN III LEVELS. Performed at [facility], [address] [place], [place] [address]   Troponin I     Status: Abnormal   Collection Time: 01/10/18  4:47 AM  Result Value Ref Range   Troponin I 1.04 (HH) <0.03 ng/mL    Comment: CRITICAL VALUE NOTED. VALUE IS CONSISTENT WITH PREVIOUSLY REPORTED/CALLED VALUE.MSS Performed at [facility], [place], [place] [address]   Glucose, capillary     Status: None   Collection Time: 01/10/18  7:47 AM  Result Value Ref Range   Glucose-Capillary 83 65 - 99 mg/dL  Glucose, capillary     Status: Abnormal   Collection Time: 01/10/18 11:40 AM  Result Value Ref Range   Glucose-Capillary 190 (H) 65 - 99 mg/dL   No components found for: ESR, C REACTIVE PROTEIN MICRO: Recent Results (from the past 720 hour(s))  Culture,  blood (Routine x 2)     Status: None (Preliminary result)   Collection Time: 01/09/18  3:56 AM  Result Value Ref Range Status   Specimen Description BLOOD RIGHT FORARM  Final   Special Requests   Final    BOTTLES DRAWN AEROBIC AND ANAEROBIC Blood Culture adequate volume   Culture   Final    NO GROWTH 1 DAY Performed at [facility], [address], [place], [place] [address]    Report Status PENDING  Incomplete  Culture, blood (Routine x 2)     Status: None (Preliminary result)   Collection Time: 01/09/18  3:56 AM  Result Value Ref Range Status   Specimen Description BLOOD LEFT AC  Final   Special Requests   Final    BOTTLES DRAWN AEROBIC AND ANAEROBIC Blood Culture adequate volume   Culture  Setup Time   Final    Organism ID to follow GRAM POSITIVE COCCI ANAEROBIC BOTTLE ONLY CRITICAL RESULT CALLED TO, READ BACK BY AND VERIFIED WITH: HANK ZOMPA @1110  01/10/18 MGP Performed at [facility], [address] [place], [place] [address]    Culture GRAM POSITIVE COCCI  Final   Report Status PENDING  Incomplete  Blood Culture ID Panel (Reflexed)     Status: Abnormal   Collection Time: 01/09/18  3:56 AM  Result Value Ref Range Status   Enterococcus species NOT DETECTED NOT DETECTED Final   Listeria monocytogenes NOT DETECTED NOT DETECTED Final   Staphylococcus species DETECTED (A) NOT DETECTED Final    Comment: Methicillin (oxacillin) susceptible coagulase negative staphylococcus. Possible blood culture contaminant (unless isolated from more than one blood culture draw or clinical case suggests pathogenicity). No antibiotic treatment is indicated for blood  culture contaminants. CRITICAL RESULT CALLED TO, READ BACK BY AND VERIFIED WITH: HANK ZOMPA @1110  01/10/18 MGP    Staphylococcus aureus NOT DETECTED NOT DETECTED Final   Methicillin resistance NOT DETECTED NOT DETECTED Final   Streptococcus species NOT DETECTED NOT DETECTED Final   Streptococcus agalactiae NOT  DETECTED NOT DETECTED Final   Streptococcus pneumoniae NOT DETECTED NOT DETECTED Final   Streptococcus pyogenes NOT DETECTED NOT DETECTED Final   Acinetobacter baumannii NOT DETECTED NOT DETECTED Final   Enterobacteriaceae species NOT DETECTED NOT DETECTED Final   Enterobacter cloacae complex NOT DETECTED NOT DETECTED Final   Escherichia coli NOT DETECTED NOT DETECTED Final   Klebsiella oxytoca NOT DETECTED NOT DETECTED Final   Klebsiella pneumoniae NOT DETECTED NOT DETECTED Final   Proteus species NOT DETECTED NOT DETECTED Final   Serratia marcescens NOT DETECTED NOT DETECTED Final   Haemophilus influenzae NOT DETECTED NOT DETECTED Final   Neisseria meningitidis NOT DETECTED NOT DETECTED Final   Pseudomonas aeruginosa NOT DETECTED NOT DETECTED Final   Candida albicans NOT DETECTED NOT DETECTED Final   Candida glabrata NOT DETECTED NOT DETECTED Final   Candida krusei NOT DETECTED NOT DETECTED Final   Candida parapsilosis NOT DETECTED NOT DETECTED Final   Candida tropicalis  NOT DETECTED NOT DETECTED Final    Comment: Performed at [facility], [address], [place], [place] [address]  Urine culture     Status: Abnormal   Collection Time: 01/09/18  5:16 AM  Result Value Ref Range Status   Specimen Description   Final    URINE, RANDOM Performed at [facility], [address] [place], [place] [address]    Special Requests   Final    NONE Performed at [facility], [address] [place], [place] [address]    Culture (A)  Final    <10,000 COLONIES/mL INSIGNIFICANT GROWTH Performed at [place] [address] [place], [place] [address]    Report Status 01/10/2018 FINAL  Final    IMAGING: Dg Chest 1 View  Result Date: 01/09/2018 CLINICAL DATA:  Sepsis.  Tachycardia and fever. EXAM: CHEST  1 VIEW COMPARISON:  Radiograph 09/05/2017 FINDINGS: The cardiomediastinal contours are normal. The lungs are clear. Pulmonary vasculature is normal. No  consolidation, pleural effusion, or pneumothorax. No acute osseous abnormalities are seen. IMPRESSION: No active disease. Electronically Signed   By: Jeb Levering M.D.   On: 01/09/2018 04:28   Dg Foot Complete Right  Result Date: 01/09/2018 CLINICAL DATA:  Initial evaluation for possible osteomyelitis. EXAM: RIGHT FOOT COMPLETE - 3+ VIEW COMPARISON:  Prior radiograph from 11/03/2015. FINDINGS: Patient status post amputation of the right forefoot through the proximal shafts of the metatarsals. Amputated margins appear sharply demarcated without radiographic evidence for osteomyelitis. Overlying soft tissue irregularity may reflect ulceration. No abnormal soft tissue emphysema. Prominent vascular calcifications noted within the lower leg and foot. No acute fracture or dislocation. IMPRESSION: 1. 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 No radiographic evidence for osteomyelitis. 2. No other acute osseous abnormality. Electronically Signed   By: Jeannine Boga M.D.   On: 01/09/2018 05:59    Assessment:   Richard [name] is a 62 y.o. male dmitted with AMS, fever and hyperglycemia. ON admit wbc was 10 and temp 102.9.  Started vanco and zosyn. BCx + for CNS. UCX neg. Procalcitonin < 0.1.  He has DM A1c 17.0 Flu PCR negative.  UA negative. CXR neg,  He has a R metatarsal amputation site with some skin breakdown but no obvius signs of infection. Foot Xray neg for osteomyelitis.  Also has hx pancreatic cancer status post resection of his pancreas 07/2017 and currently getting adjuvant chemotherapy at [facility].  He has had distap pancreatectomy and splenectomy.  Overall no evidence of bacterial infection with nml PC.  He has exceedingly poor Dm control. Clinically stable and no signs of PNA or osteomyeltis.  The CNS in bcx is likely contaminant.  Recommendations Given his post splenectomy status, admitted with fevers, and sepsis  would treat for a 7 day course with unasyn - transition to augmentin  at DC - today is day 3 of coverage. Check resp PCR If worsens consider further wu with CT abd pelvis given hx panc cancer  Thank you very much for allowing me to participate in the care of this patient. Please call with questions.   Cheral Marker. Ola Spurr, MD

## 2018-01-10 NOTE — Consult Note (Addendum)
Richard [name] is a 62 y.o. male  [id number]  Primary Cardiologist: Dr. Neoma Laming   Reason for Consultation: Elevated troponin  HPI: 62yo male with a past medical history of atrial fibrillation, CHF, diabetes mellitus, HTN, and CAD presents to ER with febrile illness and noted to be in atrial fibrillation. Noted to have troponin elevation, max of 1.60 now down to 1.04.    Review of Systems: Denies chest pain or shortness of breath. Resting comfortably.    Past Medical History:  Diagnosis Date  . Atrial fibrillation ([place])    per patient  . Cancer ([place])   . CHF (congestive heart failure) ([place])   . Chronic back pain   . Chronic leg pain   . Coronary artery disease   . Diabetes mellitus without complication ([place])   . Hypertension   . Neuropathy     Medications Prior to Admission  Medication Sig Dispense Refill  . insulin lispro (HUMALOG KWIKPEN) 100 UNIT/ML KiwkPen Inject 0.04 mLs (4 Units total) into the skin 3 (three) times daily with meals. 12 mL 0  . ACCU-CHEK AVIVA PLUS test strip U ONCE TO BID UTD  0  . ACCU-CHEK SOFTCLIX LANCETS lancets TEST 1-2 XD  0  . acetaminophen (TYLENOL) 325 MG tablet Take by mouth.    Marland Kitchen amiodarone (PACERONE) 200 MG tablet Take 200 mg by mouth daily.  0  . atorvastatin (LIPITOR) 40 MG tablet Take 40 mg by mouth every evening.    . Blood Glucose Monitoring Suppl (ACCU-CHEK AVIVA PLUS) w/Device KIT U TO TEST ONCE TO BID  0  . ferrous sulfate 325 (65 FE) MG EC tablet Take 1 tablet (325 mg total) by mouth 2 (two) times daily. 60 tablet 0  . GLOBAL EASE INJECT PEN NEEDLES 31G X 5 MM MISC Inject 1 Units into the skin daily. 100 each 0  . Insulin Glargine (BASAGLAR KWIKPEN) 100 UNIT/ML SOPN Inject 0.28 mLs (28 Units total) into the skin at bedtime. 12 mL 0  . LEVEMIR FLEXTOUCH 100 UNIT/ML Pen INJECT 28 UNITS ONCE AT BEDTIME  0  . metoprolol tartrate (LOPRESSOR) 25 MG tablet Take by mouth.    . Multiple Vitamin (MULTI-VITAMINS) TABS Take by mouth.    .  Needles & Syringes MISC Use as directed 100 each 3  . NOVOLOG MIX 70/30 FLEXPEN (70-30) 100 UNIT/ML FlexPen INJECT 4U PRIOR TO MEAL THREE TIMES A DAY SUBCUTANEOUS  0  . nystatin (MYCOSTATIN) 100000 UNIT/ML suspension take 4 milliliters by mouth four times a day  0  . SANTYL ointment APP EXT AA D  0  . sulfamethoxazole-trimethoprim (BACTRIM DS,SEPTRA DS) 800-160 MG tablet Take 1 tablet by mouth 2 (two) times daily.  0  . TRULICITY 7.09 GG/8.3MO SOPN   1  . VOLTAREN 1 % GEL   0     . amiodarone  200 mg Oral Daily  . aspirin  81 mg Oral Daily  . docusate sodium  100 mg Oral BID  . insulin aspart  0-15 Units Subcutaneous TID WC  . insulin glargine  22 Units Subcutaneous Daily  . metoprolol tartrate  25 mg Oral BID    Infusions: . sodium chloride 125 mL/hr at 01/10/18 0400  . heparin 950 Units/hr (01/10/18 0755)  . piperacillin-tazobactam (ZOSYN)  IV 3.375 g (01/10/18 0222)  . vancomycin Stopped (01/10/18 0205)    No Known Allergies  Social History   Socioeconomic History  . Marital status: Single    Spouse name: Not on file  .  Number of children: Not on file  . Years of education: Not on file  . Highest education level: Not on file  Occupational History  . Not on file  Social Needs  . Financial resource strain: Not on file  . Food insecurity:    Worry: Not on file    Inability: Not on file  . Transportation needs:    Medical: Not on file    Non-medical: Not on file  Tobacco Use  . Smoking status: Former Smoker    Packs/day: 0.50  . Smokeless tobacco: Never Used  Substance and Sexual Activity  . Alcohol use: No    Alcohol/week: 0.0 oz  . 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 Sexual activity: Not on file  Lifestyle  . Physical activity:    Days per week: Not on file    Minutes per session: Not on file  . Stress: Not on file  Relationships  . Social connections:    Talks on phone: Not on file    Gets together: Not on file     Attends religious service: Not on file    Active member of club or organization: Not on file    Attends meetings of clubs or organizations: Not on file    Relationship status: Not on file  . Intimate partner violence:    Fear of current or ex partner: Not on file    Emotionally abused: Not on file    Physically abused: Not on file    Forced sexual activity: Not on file  Other Topics Concern  . Not on file  Social History Narrative  . Not on file    Family History  Problem Relation Age of Onset  . CAD Mother   . Diabetes Mellitus II Father     PHYSICAL EXAM: Vitals:   01/09/18 2210 01/10/18 0500  BP: (!) 100/55 110/62  Pulse: 73 82  Resp: 18 18  Temp: 97.6 F (36.4 C) 97.7 F (36.5 C)  SpO2: 100% 97%     Intake/Output Summary (Last 24 hours) at 01/10/2018 0841 Last data filed at 01/10/2018 0600 Gross per 24 hour  Intake 2728 ml  Output 650 ml  Net 2078 ml    General: Reports being sleepy HEENT: normal Neck: supple. no JVD. Carotids 2+ bilat; no bruits. No lymphadenopathy or thryomegaly appreciated. Cor: PMI nondisplaced. Regular rate & rhythm. No rubs, gallops or murmurs. Lungs: clear Abdomen: soft, nontender, nondistended. No hepatosplenomegaly. No bruits or masses. Good bowel sounds. Extremities: no cyanosis, clubbing, rash, edema Neuro: alert & oriented x 3, cranial nerves grossly intact. moves all 4 extremities w/o difficulty. Affect pleasant.  ECG: Paroxysmal atrial fibrillation, currently in NSR 62bpm  Results for orders placed or performed during the hospital encounter of 01/09/18 (from the past 24 hour(s))  Glucose, capillary     Status: Abnormal   Collection Time: 01/09/18  9:37 AM  Result Value Ref Range   Glucose-Capillary 409 (H) 65 - 99 mg/dL  Glucose, capillary     Status: Abnormal   Collection Time: 01/09/18 12:00 PM  Result Value Ref Range   Glucose-Capillary 356 (H) 65 - 99 mg/dL  Lactic acid, plasma     Status: None   Collection Time:  01/09/18  2:45 PM  Result Value Ref Range   Lactic Acid, Venous 1.7 0.5 - 1.9 mmol/L  Troponin I     Status: Abnormal   Collection Time: 01/09/18  2:45 PM  Result Value Ref Range   Troponin I 1.60 (HH) <0.03 ng/mL  Glucose, capillary     Status: Abnormal   Collection Time: 01/09/18  5:01 PM  Result Value Ref Range   Glucose-Capillary 205 (H) 65 - 99 mg/dL  Troponin I     Status: Abnormal   Collection Time: 01/09/18  6:16 PM  Result Value Ref Range   Troponin I 1.51 (HH) <0.03 ng/mL  Troponin I     Status: Abnormal   Collection Time: 01/10/18  4:40 AM  Result Value Ref Range   Troponin I 0.78 (HH) <0.03 ng/mL  CBC     Status: Abnormal   Collection Time: 01/10/18  4:40 AM  Result Value Ref Range   WBC 8.1 3.8 - 10.6 K/uL   RBC 3.53 (L) 4.40 - 5.90 MIL/uL   Hemoglobin 10.0 (L) 13.0 - 18.0 g/dL   HCT 30.6 (L) 40.0 - 52.0 %   MCV 86.7 80.0 - 100.0 fL   MCH 28.3 26.0 - 34.0 pg   MCHC 32.7 32.0 - 36.0 g/dL   RDW 15.4 (H) 11.5 - 14.5 %   Platelets 249 150 - 440 K/uL  Heparin level (unfractionated)     Status: Abnormal   Collection Time: 01/10/18  4:40 AM  Result Value Ref Range   Heparin Unfractionated <0.10 (L) 0.30 - 0.70 IU/mL  Troponin I     Status: Abnormal   Collection Time: 01/10/18  4:47 AM  Result Value Ref Range   Troponin I 1.04 (HH) <0.03 ng/mL  Glucose, capillary     Status: None   Collection Time: 01/10/18  7:47 AM  Result Value Ref Range   Glucose-Capillary 83 65 - 99 mg/dL   Dg Chest 1 View  Result Date: 01/09/2018 CLINICAL DATA:  Sepsis.  Tachycardia and fever. EXAM: CHEST  1 VIEW COMPARISON:  Radiograph 09/05/2017 FINDINGS: The cardiomediastinal contours are normal. The lungs are clear. Pulmonary vasculature is normal. No consolidation, pleural effusion, or pneumothorax. No acute osseous abnormalities are seen. IMPRESSION: No active disease. Electronically Signed   By: Jeb Levering M.D.   On: 01/09/2018 04:28   Dg Foot Complete Right  Result Date:  01/09/2018 CLINICAL DATA:  Initial evaluation for possible osteomyelitis. EXAM: RIGHT FOOT COMPLETE - 3+ VIEW COMPARISON:  Prior radiograph from 11/03/2015. FINDINGS: Patient status post amputation of the right forefoot through the proximal shafts of the metatarsals. Amputated margins appear sharply demarcated without radiographic evidence for osteomyelitis. Overlying soft tissue irregularity may reflect ulceration. No abnormal soft tissue emphysema. Prominent vascular calcifications noted within the lower leg and foot. No acute fracture or dislocation. IMPRESSION: 1. 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 No radiographic evidence for osteomyelitis. 2. No other acute osseous abnormality. Electronically Signed   By: Jeannine Boga M.D.   On: 01/09/2018 05:59     ASSESSMENT AND PLAN:  Elevated troponin: Troponin trending down, pt was admitted with atrial fibrillation with RVR, now in NSR rate 62bpm. Pt had a stress test in December 2018 which was largely normal. Troponin elevation likely due to demand ischemia. Will plan to do outpatient CCTA as soon as discharged.  Last echo shows severe LVH and grade 1 diastolic dysfunction. Will repeat echo today.  Paroxysmal Atrial fibrillation: History of GI bleeding with anticoagulation, Eliquis stopped November 2018. Continue amiodarone and metoprolol succinate.  Cardiac point of view not having chest pain, will do CTA coronaries next Monday at 10 AM. Advise f/u 10 am Monday. Advise  placing on ellequis. Jake Bathe, NP-C Cell: [phone]

## 2018-01-11 ENCOUNTER — Inpatient Hospital Stay: Payer: Medicare Other

## 2018-01-11 LAB — CULTURE, BLOOD (ROUTINE X 2): SPECIAL REQUESTS: ADEQUATE

## 2018-01-11 LAB — GLUCOSE, CAPILLARY
Glucose-Capillary: 91 mg/dL (ref 65–99)
Glucose-Capillary: 136 mg/dL — ABNORMAL HIGH (ref 65–99)
Glucose-Capillary: 93 mg/dL (ref 65–99)

## 2018-01-11 MED ORDER — ADULT MULTIVITAMIN W/MINERALS CH: 1.0000 | ORAL_TABLET | Freq: Every day | ORAL | 1 refills | Status: DC

## 2018-01-11 MED ORDER — METOPROLOL TARTRATE 25 MG PO TABS: 25.0000 mg | ORAL_TABLET | Freq: Two times a day (BID) | ORAL | 0 refills | Status: DC

## 2018-01-11 MED ORDER — ATORVASTATIN CALCIUM 40 MG PO TABS: 40.0000 mg | ORAL_TABLET | Freq: Every evening | ORAL | 0 refills | Status: DC

## 2018-01-11 MED ORDER — OXYCODONE HCL 5 MG PO TABS: 5.0000 mg | ORAL_TABLET | ORAL | 0 refills | Status: DC | PRN

## 2018-01-11 MED ORDER — APIXABAN 5 MG PO TABS: 5.0000 mg | ORAL_TABLET | Freq: Two times a day (BID) | ORAL | 0 refills | Status: DC

## 2018-01-11 MED ORDER — INSULIN GLARGINE 100 UNIT/ML ~~LOC~~ SOLN: 22.0000 [IU] | Freq: Every day | SUBCUTANEOUS | 1 refills | Status: DC

## 2018-01-11 MED ORDER — AMOXICILLIN-POT CLAVULANATE 875-125 MG PO TABS: 1.0000 | ORAL_TABLET | Freq: Two times a day (BID) | ORAL | 0 refills | Status: DC

## 2018-01-11 MED ORDER — ACETAMINOPHEN 325 MG PO TABS: 325.0000 mg | ORAL_TABLET | Freq: Four times a day (QID) | ORAL | 0 refills | Status: DC | PRN

## 2018-01-11 MED ORDER — ASPIRIN 81 MG PO CHEW: 81.0000 mg | CHEWABLE_TABLET | Freq: Every day | ORAL | 0 refills | Status: DC

## 2018-01-11 MED ORDER — PREMIER PROTEIN SHAKE: 11.0000 [oz_av] | Freq: Two times a day (BID) | ORAL | 0 refills | Status: DC

## 2018-01-11 MED ORDER — INSULIN ASPART 100 UNIT/ML ~~LOC~~ SOLN: 0.0000 [IU] | Freq: Three times a day (TID) | SUBCUTANEOUS | 1 refills | Status: DC

## 2018-01-11 MED ORDER — AMOXICILLIN-POT CLAVULANATE 875-125 MG PO TABS
1.0000 | ORAL_TABLET | Freq: Two times a day (BID) | ORAL | Status: DC
  Administered 2018-01-11: 1 via ORAL
  Filled 2018-01-11: qty 1

## 2018-01-11 MED ORDER — DOCUSATE SODIUM 100 MG PO CAPS: 100.0000 mg | ORAL_CAPSULE | Freq: Two times a day (BID) | ORAL | 0 refills | Status: DC | PRN

## 2018-01-11 NOTE — Care Management (Signed)
RNCM spoke with patient regarding discharge and medication assistance. He was confused and said that he has Medicaid.  He denies need.

## 2018-01-11 NOTE — Progress Notes (Signed)
Patient can be seen in office for cta coronaries Monday 10AM.

## 2018-01-11 NOTE — Discharge Summary (Signed)
[facility] NAME: Richard [name]    MR#:  [id number]  DATE OF BIRTH:  05-Oct-1956  DATE OF ADMISSION:  01/09/2018 ADMITTING PHYSICIAN: Harrie Foreman, MD  DATE OF DISCHARGE:  01/11/18  PRIMARY CARE PHYSICIAN: [facility] [place]    ADMISSION DIAGNOSIS:  Delirium [R41.0] Hyperglycemia [R73.9] Sepsis ([place]) [A41.9] Sepsis, due to unspecified organism ([place]) [A41.9] Altered mental status, unspecified altered mental status type [R41.82] Diabetic ketoacidosis with coma associated with type 2 diabetes mellitus ([place]) [E11.11]  DISCHARGE DIAGNOSIS:  Active Problems:   Sepsis ([place])   Protein-calorie malnutrition, severe Uncontrolled diabetes mellitus DKA resolved Atrial fibrillation Elevated troponin SECONDARY DIAGNOSIS:   Past Medical History:  Diagnosis Date  . Atrial fibrillation ([place])    per patient  . Cancer ([place])   . CHF (congestive heart failure) ([place])   . Chronic back pain   . Chronic leg pain   . Coronary artery disease   . Diabetes mellitus without complication ([place])   . Hypertension   . Neuropathy     HOSPITAL COURSE:   HPI: The patient with past medical history of diabetes, coronary artery disease, atrial fibrillation and CHF presents to the emergency department via EMS due to high blood sugar.  The patient reported his glucometer is reading "high".  Further details are difficult to gather as the patient was confused upon arrival to the emergency department, although he was able to follow commands.  He admitted to feeling chills.  He was found to have a fever as well as tachycardia and hyperglycemia.  Lactic acid was elevated.  Blood sugar found to be greater than 500 with concomitant glucosuria.  Patient was started on broad-spectrum antibiotics prior to the emergency department staff called the hospitalist service for admission.  1.  Acute febrile illness/sepsis -Source remains unknown--he  has viral syndrome -Blood cultures with coagulase-negative Staphylococcus, urine culture with insignificant growth.  Given his postsplenectomy status, admitting with fevers and sepsis ID has recommended to treat the patient for 7-day course of antibiotics.  Patient was initially treated with Unasyn for 3 days will discharge him home with p.o. Augmentin for 4 more days -Flu test is negative, HIV screen nonreactive -Patient has right metatarsal amputation skin breakdown present however no significant discharge or signs of cellulitis noted.  X-ray of the right foot negative for osteomyelitis-UA and chest x-ray clear Patient was initially receiving Zosyn and vancomycin and eventually it was changed to Zosyn and will discharge the patient with p.o. Augmentin   2.  Elevated troponin/acute NST EMI/demand ischemia in the setting of #1 -Patient has history of coronary artery disease-- --Patient had cardiac cath in [facility] at [facility] unable to obtain results results -Patient seen by Dr. Chancy Milroy cardiology.  He recommended DC heparin drip.   Started patient on Eliquis for anticoagulation and further workup as outpatient by getting a CT angiogram of coronaries in Dr. Marella Bile office Monday at 10 AM -  No further recommendations per cardiology  3.  Uncontrolled diabetes -A1c is 17 -Diabetes coordinator consultation appreciated--recommending Lantus 22 units once a day and NovoLog 3 times a day Consult case manager regarding medicine  assistance  4.  Acute renal failure with clinical dehydration -Improved with IV fluids  5.  History of atrial fibrillation -Patient currently on amiodarone and metoprolol -Start patient on Eliquis per cardiology recommendation and continue other cardiac meds once medication reconciliation is done.  Patient will follow-up with cardiology office as an  outpatient for further workup.  Dr. Yancey Flemings recommends CT angiogram of the chest/heart next week in his office  6.  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 and currently getting adjuvant chemotherapy at [facility]          Past Medical History:      DISCHARGE CONDITIONS:  stable  CONSULTS OBTAINED:  Treatment Team:  Dionisio David, MD Leonel Ramsay, MD   PROCEDURES  None   DRUG ALLERGIES:  No Known Allergies  DISCHARGE MEDICATIONS:   Allergies as of 01/11/2018   No Known Allergies     Medication List    STOP taking these medications   BASAGLAR KWIKPEN 100 UNIT/ML Sopn Replaced by:  insulin glargine 100 UNIT/ML injection   ferrous sulfate 325 (65 FE) MG EC tablet   insulin lispro 100 UNIT/ML KiwkPen Commonly known as:  HUMALOG KWIKPEN   TRULICITY 9.35 TS/1.7BL Sopn Generic drug:  Dulaglutide     TAKE these medications   ACCU-CHEK AVIVA PLUS test strip Generic drug:  glucose blood U ONCE TO BID UTD   ACCU-CHEK AVIVA PLUS w/Device Kit U TO TEST ONCE TO BID   ACCU-CHEK SOFTCLIX LANCETS lancets TEST 1-2 XD   acetaminophen 325 MG tablet Commonly known as:  TYLENOL Take 1 tablet (325 mg total) by mouth every 6 (six) hours as needed for mild pain (or Fever >/= 101).   amoxicillin-clavulanate 875-125 MG tablet Commonly known as:  AUGMENTIN Take 1 tablet by mouth every 12 (twelve) hours.   apixaban 5 MG Tabs tablet Commonly known as:  ELIQUIS Take 1 tablet (5 mg total) by mouth 2 (two) times daily.   aspirin 81 MG chewable tablet Chew 1 tablet (81 mg total) by mouth daily. Start taking on:  01/12/2018   atorvastatin 40 MG tablet Commonly known as:  LIPITOR Take 1 tablet (40 mg total) by mouth every evening.   docusate sodium 100 MG capsule Commonly known as:  COLACE Take 1 capsule (100 mg total) by mouth 2 (two) times daily as needed for mild constipation.   GLOBAL EASE INJECT PEN NEEDLES 31G X 5 MM Misc Generic drug:  Insulin Pen Needle Inject 1 Units into the skin daily.   insulin aspart 100 UNIT/ML injection Commonly known as:   novoLOG Inject 0-15 Units into the skin 3 (three) times daily with meals. CBG < 70: implement hypoglycemia protocol CBG 70 - 120: 0 units CBG 121 - 150: 2 units CBG 151 - 200: 3 units CBG 201 - 250: 5 units CBG 251 - 300: 8 units CBG 301 - 350: 11 units CBG 351 - 400: 15 units CBG > 400: call MD and obtain STAT lab verification   insulin glargine 100 UNIT/ML injection Commonly known as:  LANTUS Inject 0.22 mLs (22 Units total) into the skin daily. Start taking on:  01/12/2018 Replaces:  BASAGLAR KWIKPEN 100 UNIT/ML Sopn   metoprolol tartrate 25 MG tablet Commonly known as:  LOPRESSOR Take 1 tablet (25 mg total) by mouth 2 (two) times daily. What changed:    how much to take  when to take this   multivitamin with minerals Tabs tablet Take 1 tablet by mouth daily. Start taking on:  01/12/2018   Needles & Syringes Misc Use as directed   oxyCODONE 5 MG immediate release tablet Commonly known as:  Oxy IR/ROXICODONE Take 1 tablet (5 mg total) by mouth every 4 (four) hours as needed for moderate pain.   protein supplement shake  Liqd Commonly known as:  PREMIER PROTEIN Take 325 mLs (11 oz total) by mouth 2 (two) times daily between meals. Start taking on:  01/12/2018   VOLTAREN 1 % Gel Generic drug:  diclofenac sodium Apply 2 g topically 4 (four) times daily.        DISCHARGE INSTRUCTIONS:   Follow-up with primary care physician in 1 week Follow-up with endocrinology in 1 week Follow-up with cardiology Dr. Humphrey Rolls on Monday 3/25  at 10 AM at his office  DIET:  Cardiac diet and Diabetic diet  DISCHARGE CONDITION:  Stable  ACTIVITY:  Activity as tolerated  OXYGEN:  Home Oxygen: No.   Oxygen Delivery: room air  DISCHARGE LOCATION:  home   If you experience worsening of your admission symptoms, develop shortness of breath, life threatening emergency, suicidal or homicidal thoughts you must seek medical attention immediately by calling 911 or calling your MD  immediately  if symptoms less severe.  You Must read complete instructions/literature along with all the possible adverse reactions/side effects for all the Medicines you take and that have been prescribed to you. Take any new Medicines after you have completely understood and accpet all the possible adverse reactions/side effects.   Please note  You were cared for by a hospitalist during your hospital stay. If you have any questions about your discharge medications or the care you received while you were in the hospital after you are discharged, you can call the unit and asked to speak with the hospitalist on call if the hospitalist that took care of you is not available. Once you are discharged, your primary care physician will handle any further medical issues. Please note that NO REFILLS for any discharge medications will be authorized once you are discharged, as it is imperative that you return to your primary care physician (or establish a relationship with a primary care physician if you do not have one) for your aftercare needs so that they can reassess your need for medications and monitor your lab values.     Today  Chief Complaint  Patient presents with  . Hyperglycemia   This patient is feeling fine.  Reporting left knee and ankle pain x-rays are normal Consulted case management regarding medication assistance and patient will be discharged home  ROS:  CONSTITUTIONAL: Denies fevers, chills. Denies any fatigue, weakness.  EYES: Denies blurry vision, double vision, eye pain. EARS, NOSE, THROAT: Denies tinnitus, ear pain, hearing loss. RESPIRATORY: Denies cough, wheeze, shortness of breath.  CARDIOVASCULAR: Denies chest pain, palpitations, edema.  GASTROINTESTINAL: Denies nausea, vomiting, diarrhea, abdominal pain. Denies bright red blood per rectum. GENITOURINARY: Denies dysuria, hematuria. ENDOCRINE: Denies nocturia or thyroid problems. HEMATOLOGIC AND LYMPHATIC: Denies easy  bruising or bleeding. SKIN: Denies rash or lesion. MUSCULOSKELETAL: Denies pain in neck, back, shoulder, knees, hips or arthritic symptoms.  NEUROLOGIC: Denies paralysis, paresthesias.  PSYCHIATRIC: Denies anxiety or depressive symptoms.   VITAL SIGNS:  Blood pressure (!) 115/59, pulse (!) 58, temperature 98.2 F (36.8 C), temperature source Oral, resp. rate 18, height 6' (1.829 m), weight 62.9 kg (138 lb 11.2 oz), SpO2 99 %.  I/O:    Intake/Output Summary (Last 24 hours) at 01/11/2018 1410 Last data filed at 01/11/2018 1238 Gross per 24 hour  Intake 1020 ml  Output 1150 ml  Net -130 ml    PHYSICAL EXAMINATION:  GENERAL:  62 y.o.-year-old patient lying in the bed with no acute distress.  EYES: Pupils equal, round, reactive to light and accommodation. No scleral icterus. Extraocular  muscles intact.  HEENT: Head atraumatic, normocephalic. Oropharynx and nasopharynx clear.  NECK:  Supple, no jugular venous distention. No thyroid enlargement, no tenderness.  LUNGS: Normal breath sounds bilaterally, no wheezing, rales,rhonchi or crepitation. No use of accessory muscles of respiration.  CARDIOVASCULAR: S1, S2 normal. No murmurs, rubs, or gallops.  ABDOMEN: Soft, non-tender, non-distended. Bowel sounds present. No organomegaly or mass.  EXTREMITIES: No pedal edema, cyanosis, or clubbing.  NEUROLOGIC: Cranial nerves II through XII are intact. Muscle strength 5/5 in all extremities. Sensation intact. Gait not checked.  PSYCHIATRIC: The patient is alert and oriented x 3.  SKIN: No obvious rash, lesion, or ulcer.   DATA REVIEW:   CBC Recent Labs  Lab 01/10/18 0440  WBC 8.1  HGB 10.0*  HCT 30.6*  PLT 249    Chemistries  Recent Labs  Lab 01/09/18 0410 01/09/18 0430  NA 136  --   K 4.0  --   CL 98*  --   CO2 24  --   GLUCOSE 579*  --   BUN 14  --   CREATININE 1.00  --   CALCIUM 9.1  --   AST  --  33  ALT  --  23  ALKPHOS  --  24*  BILITOT  --  0.9    Cardiac  Enzymes Recent Labs  Lab 01/10/18 0447  TROPONINI 1.04*    Microbiology Results  Results for orders placed or performed during the hospital encounter of 01/09/18  Culture, blood (Routine x 2)     Status: None (Preliminary result)   Collection Time: 01/09/18  3:56 AM  Result Value Ref Range Status   Specimen Description BLOOD RIGHT FORARM  Final   Special Requests   Final    BOTTLES DRAWN AEROBIC AND ANAEROBIC Blood Culture adequate volume   Culture   Final    NO GROWTH 2 DAYS Performed at [facility], [address] [place], [place] [address]    Report Status PENDING  Incomplete  Culture, blood (Routine x 2)     Status: Abnormal   Collection Time: 01/09/18  3:56 AM  Result Value Ref Range Status   Specimen Description   Final    BLOOD LEFT AC Performed at [facility], [address], [place], [place] [address]    Special Requests   Final    BOTTLES DRAWN AEROBIC AND ANAEROBIC Blood Culture adequate volume Performed at [facility], [address], [place], [place] [address]    Culture  Setup Time   Final    GRAM POSITIVE COCCI ANAEROBIC BOTTLE ONLY CRITICAL RESULT CALLED TO, READ BACK BY AND VERIFIED WITH: [place] @1110  01/10/18 MGP    Culture (A)  Final    STAPHYLOCOCCUS SPECIES (COAGULASE NEGATIVE) THE SIGNIFICANCE OF ISOLATING THIS ORGANISM FROM A SINGLE SET OF BLOOD CULTURES WHEN MULTIPLE SETS ARE DRAWN IS UNCERTAIN. PLEASE NOTIFY THE MICROBIOLOGY DEPARTMENT WITHIN ONE WEEK IF SPECIATION AND SENSITIVITIES ARE REQUIRED. Performed at [facility], [place] [address] [place],  [address]    Report Status 01/11/2018 FINAL  Final  Blood Culture ID Panel (Reflexed)     Status: Abnormal   Collection Time: 01/09/18  3:56 AM  Result Value Ref Range Status   Enterococcus species NOT DETECTED NOT DETECTED Final   Listeria monocytogenes NOT DETECTED NOT DETECTED Final   Staphylococcus species DETECTED (A) NOT DETECTED Final     Comment: Methicillin (oxacillin) susceptible coagulase negative staphylococcus. Possible blood culture contaminant (unless isolated from  more than one blood culture draw or clinical case suggests pathogenicity). No antibiotic treatment is indicated for blood  culture contaminants. CRITICAL RESULT CALLED TO, READ BACK BY AND VERIFIED WITH: HANK ZOMPA @1110  01/10/18 MGP    Staphylococcus aureus NOT DETECTED NOT DETECTED Final   Methicillin resistance NOT DETECTED NOT DETECTED Final   Streptococcus species NOT DETECTED NOT DETECTED Final   Streptococcus agalactiae NOT DETECTED NOT DETECTED Final   Streptococcus pneumoniae NOT DETECTED NOT DETECTED Final   Streptococcus pyogenes NOT DETECTED NOT DETECTED Final   Acinetobacter baumannii NOT DETECTED NOT DETECTED Final   Enterobacteriaceae species NOT DETECTED NOT DETECTED Final   Enterobacter cloacae complex NOT DETECTED NOT DETECTED Final   Escherichia coli NOT DETECTED NOT DETECTED Final   Klebsiella oxytoca NOT DETECTED NOT DETECTED Final   Klebsiella pneumoniae NOT DETECTED NOT DETECTED Final   Proteus species NOT DETECTED NOT DETECTED Final   Serratia marcescens NOT DETECTED NOT DETECTED Final   Haemophilus influenzae NOT DETECTED NOT DETECTED Final   Neisseria meningitidis NOT DETECTED NOT DETECTED Final   Pseudomonas aeruginosa NOT DETECTED NOT DETECTED Final   Candida albicans NOT DETECTED NOT DETECTED Final   Candida glabrata NOT DETECTED NOT DETECTED Final   Candida krusei NOT DETECTED NOT DETECTED Final   Candida parapsilosis NOT DETECTED NOT DETECTED Final   Candida tropicalis NOT DETECTED NOT DETECTED Final    Comment: Performed at [facility], [address] [place], [place] [address]  Urine culture     Status: Abnormal   Collection Time: 01/09/18  5:16 AM  Result Value Ref Range Status   Specimen Description   Final    URINE, RANDOM Performed at [facility], [address], [place], [place] [address]     Special Requests   Final    NONE Performed at [facility], [address], [place], [place] [address]    Culture (A)  Final    <10,000 COLONIES/mL INSIGNIFICANT GROWTH Performed at [facility], [address], [place], [place] [address]    Report Status 01/10/2018 FINAL  Final    RADIOLOGY:  Dg Chest 1 View  Result Date: 01/09/2018 CLINICAL DATA:  Sepsis.  Tachycardia and fever. EXAM: CHEST  1 VIEW COMPARISON:  Radiograph 09/05/2017 FINDINGS: The cardiomediastinal contours are normal. The lungs are clear. Pulmonary vasculature is normal. No consolidation, pleural effusion, or pneumothorax. No acute osseous abnormalities are seen. IMPRESSION: No active disease. Electronically Signed   By: Jeb Levering M.D.   On: 01/09/2018 04:28   Dg Knee 1-2 Views Left  Result Date: 01/11/2018 CLINICAL DATA:  Left knee and ankle pain. EXAM: LEFT KNEE - 1-2 VIEW COMPARISON:  None. FINDINGS: Left knee is located without a fracture, dislocation or large joint effusion. Minimal spurring at the patellofemoral compartment of the knee. Minimal medial joint space narrowing. Prominent spur in the medial distal femur near the medial collateral ligament. IMPRESSION: No acute abnormality to left knee. Mild degenerative changes. Electronically Signed   By: Markus Daft M.D.   On: 01/11/2018 11:47   Dg Ankle 2 Views Left  Result Date: 01/11/2018 CLINICAL DATA:  LEFT knee and diffuse ankle pain for 1 week, denies injury, history diabetes mellitus, hypertension, atrial fibrillation, CHF EXAM: LEFT ANKLE - 2 VIEW COMPARISON:  None FINDINGS: Osseous mineralization normal. Joint spaces preserved. Question mild anterior soft tissue swelling. No acute fracture, dislocation, or bone destruction. Small vessel atherosclerotic calcifications at lower leg and ankle question related to diabetes mellitus.  IMPRESSION: No acute osseous abnormalities. Electronically Signed   By: Lavonia Dana M.D.   On: 01/11/2018 11:45    Dg Foot Complete Right  Result Date: 01/09/2018 CLINICAL DATA:  Initial evaluation for possible osteomyelitis. EXAM: RIGHT FOOT COMPLETE - 3+ VIEW COMPARISON:  Prior radiograph from 11/03/2015. FINDINGS: Patient status post amputation of the right forefoot through the proximal shafts of the metatarsals. Amputated margins appear sharply demarcated without radiographic evidence for osteomyelitis. Overlying soft tissue irregularity may reflect ulceration. No abnormal soft tissue emphysema. Prominent vascular calcifications noted within the lower leg and foot. No acute fracture or dislocation. IMPRESSION: 1. 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 No radiographic evidence for osteomyelitis. 2. No other acute osseous abnormality. Electronically Signed   By: Jeannine Boga M.D.   On: 01/09/2018 05:59    EKG:   Orders placed or performed during the hospital encounter of 01/09/18  . EKG 12-Lead  . EKG 12-Lead  . EKG 12-Lead  . EKG 12-Lead      Management plans discussed with the patient, family and they are in agreement.  CODE STATUS:     Code Status Orders  (From admission, onward)        Start     Ordered   01/09/18 1026  Full code  Continuous     01/09/18 1025    Code Status History    Date Active Date Inactive Code Status Order ID Comments User Context   09/05/2017 1559 09/11/2017 1928 Full Code [id number]  Fritzi Mandes, MD Inpatient   08/24/2017 0238 08/29/2017 2131 Full Code [id number]  Lance Coon, MD Inpatient   08/10/2015 0822 08/11/2015 1947 Full Code [id number]  Harrie Foreman, MD Inpatient      TOTAL TIME TAKING CARE OF THIS PATIENT: 45  minutes.   Note: This dictation was prepared with Dragon dictation along with smaller phrase technology. Any transcriptional errors that result from this process are unintentional.   @[facility] @  on 01/11/2018 at 2:10 PM  Between 7am to 6pm - Pager - [phone]  After 6pm go to [email or web address] - password [email or web address]  [facility]  Office  [phone]  CC: Primary care physician; [facility], [place]

## 2018-01-11 NOTE — Discharge Instructions (Addendum)
°  Follow-up with primary care physician in 1 week Follow-up with endocrinology in 1 week Follow-up with cardiology Dr. Humphrey Rolls on Monday 3/25  at 18 AM at his office    Information on my medicine - ELIQUIS (apixaban)  This medication education was reviewed with me or my healthcare representative as part of my discharge preparation.  The pharmacist that spoke with me during my hospital stay was:  Sheema M Hallaji, RPH  Why was Eliquis prescribed for you? Eliquis was prescribed for you to reduce the risk of a blood clot forming that can cause a stroke if you have a medical condition called atrial fibrillation (a type of irregular heartbeat).  What do You need to know about Eliquis ? Take your Eliquis TWICE DAILY - one tablet in the morning and one tablet in the evening with or without food. If you have difficulty swallowing the tablet whole please discuss with your pharmacist how to take the medication safely.  Take Eliquis exactly as prescribed by your doctor and DO NOT stop taking Eliquis without talking to the doctor who prescribed the medication.  Stopping may increase your risk of developing a stroke.  Refill your prescription before you run out.  After discharge, you should have regular check-up appointments with your healthcare provider that is prescribing your Eliquis.  In the future your dose may need to be changed if your kidney function or weight changes by a significant amount or as you get older.  What do you do if you miss a dose? If you miss a dose, take it as soon as you remember on the same day and resume taking twice daily.  Do not take more than one dose of ELIQUIS at the same time to make up a missed dose.  Important Safety Information A possible side effect of Eliquis is bleeding. You should call your healthcare provider right away if you experience any of the following: ? Bleeding from an injury or your nose that does not stop. ? Unusual colored urine (red or dark  brown) or unusual colored stools (red or black). ? Unusual bruising for unknown reasons. ? A serious fall or if you hit your head (even if there is no bleeding).  Some medicines may interact with Eliquis and might increase your risk of bleeding or clotting while on Eliquis. To help avoid this, consult your healthcare provider or pharmacist prior to using any new prescription or non-prescription medications, including herbals, vitamins, non-steroidal anti-inflammatory drugs (NSAIDs) and supplements.  This website has more information on Eliquis (apixaban): http://www.eliquis.com/eliquis/home

## 2018-01-14 LAB — CULTURE, BLOOD (ROUTINE X 2)
CULTURE: NO GROWTH
SPECIAL REQUESTS: ADEQUATE

## 2018-02-14 ENCOUNTER — Telehealth: Payer: Self-pay | Admitting: Gastroenterology

## 2018-02-14 NOTE — Telephone Encounter (Signed)
LVM for patient to call our office for a recall appt.

## 2018-06-28 IMAGING — CT CT ABD-PELV W/O CM
2 of 4 series · 15 of 46 positions shown, 17 images · non-contrast
Comparison: 08/26/2017

CLINICAL DATA: Severe abdominal pain. 61 y.o. yo male with
borderline resectable pancreatic cancer now s/p distal
pancreatectomy + splenectomy and excisional liver biopsy (negative)
on 07/31/17 at [facility]. Pathology c/w T2N0 pancreatic cancer, negative
margins.

EXAM:
CT ABDOMEN AND PELVIS WITHOUT CONTRAST
TECHNIQUE: Multidetector CT imaging of the abdomen and pelvis was performed
following the standard protocol without IV contrast.

[Series 2: routine abd/pel wo · axial · 0.76mm/px · z∈[-1169,-784]mm · 12 of 85 slices shown, 14 images]
[im 4/85  soft-tissue]
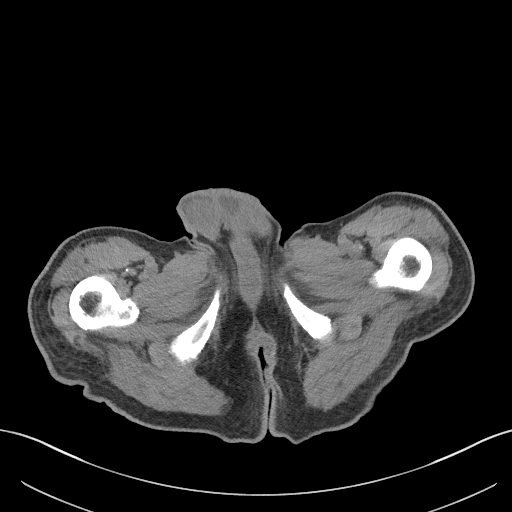
[im 4/85  bone]
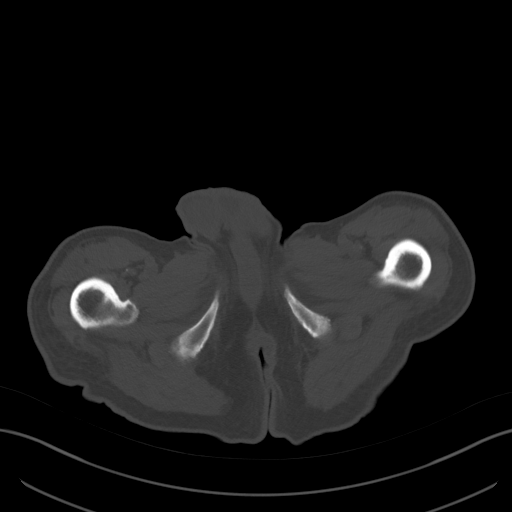
[im 11/85  soft-tissue]
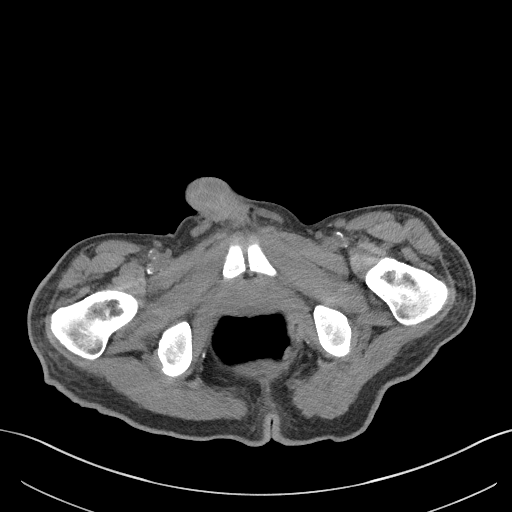
[im 18/85  soft-tissue]
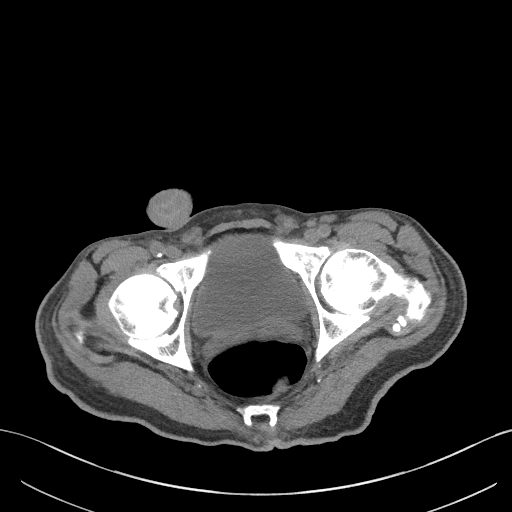
[im 25/85  soft-tissue]
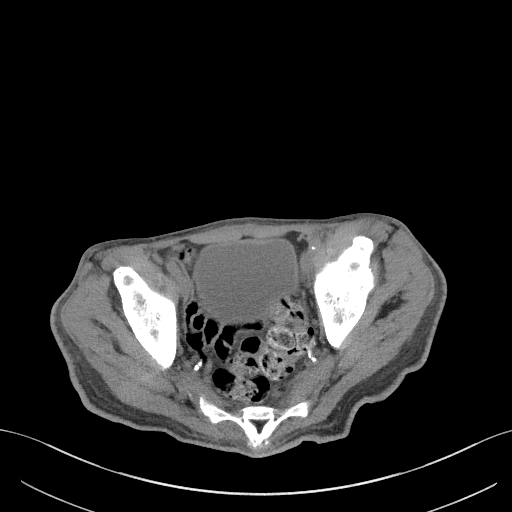
[im 32/85  soft-tissue]
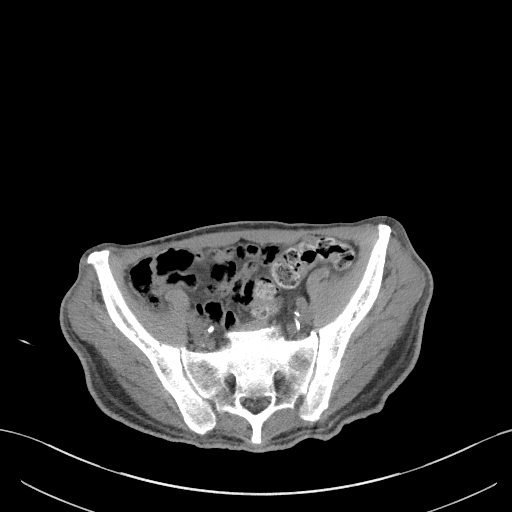
[im 39/85  soft-tissue]
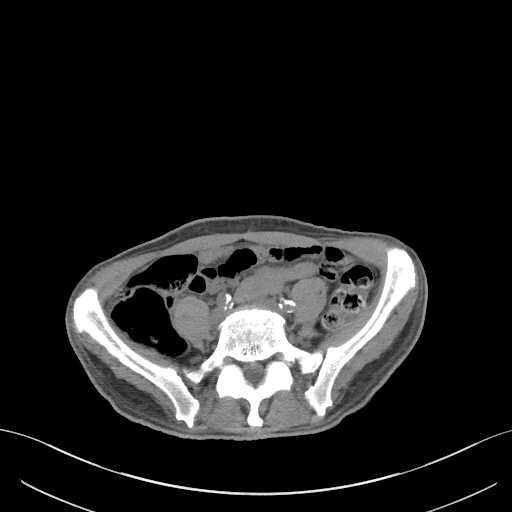
[im 46/85  soft-tissue]
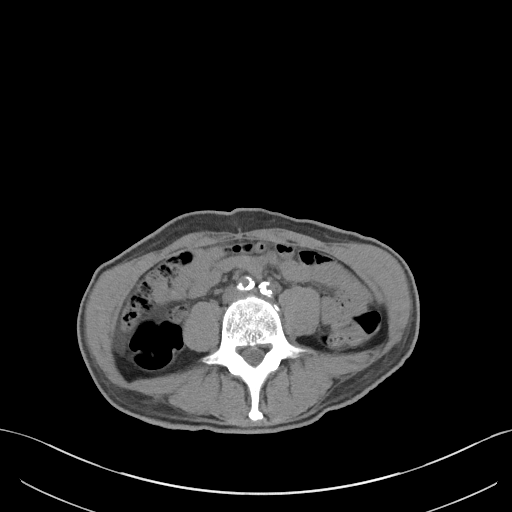
[im 53/85  soft-tissue]
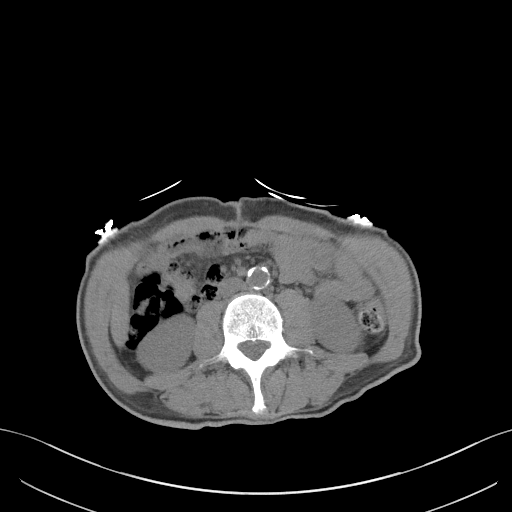
[im 60/85  soft-tissue]
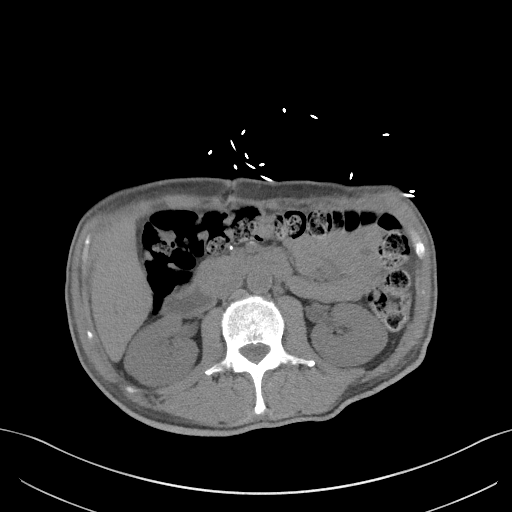
[im 60/85  bone]
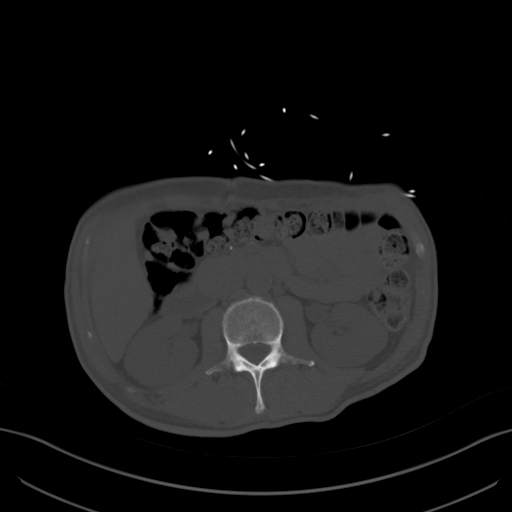
[im 67/85  soft-tissue]
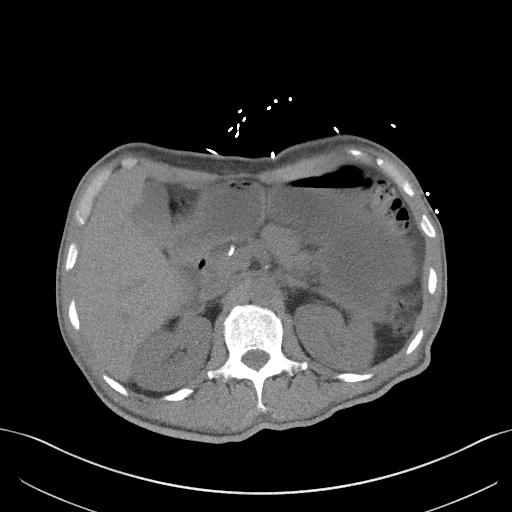
[im 74/85  soft-tissue]
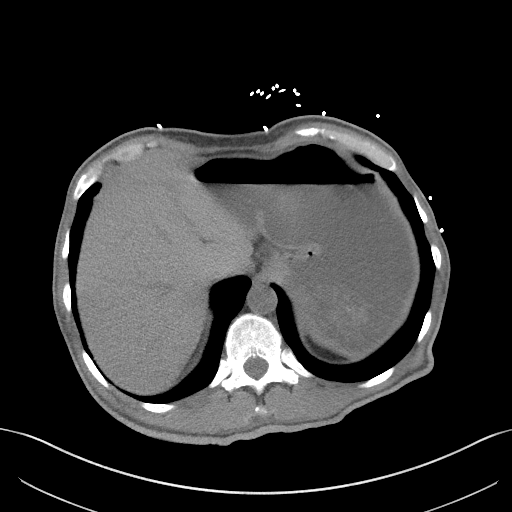
[im 81/85  soft-tissue]
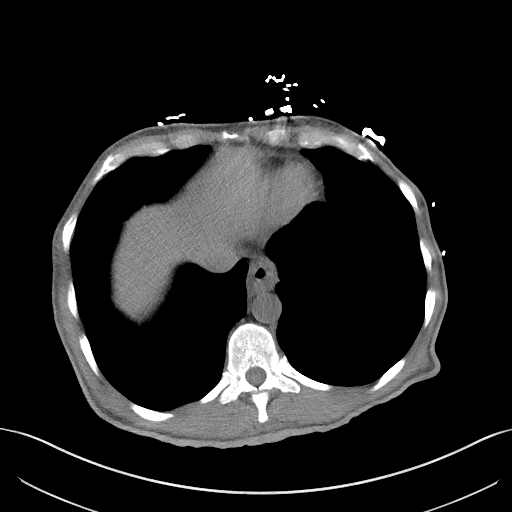

[Series 4: coronal st · coronal · 0.68mm/px · 3 of 80 slices shown]
[im 27/80  soft-tissue]
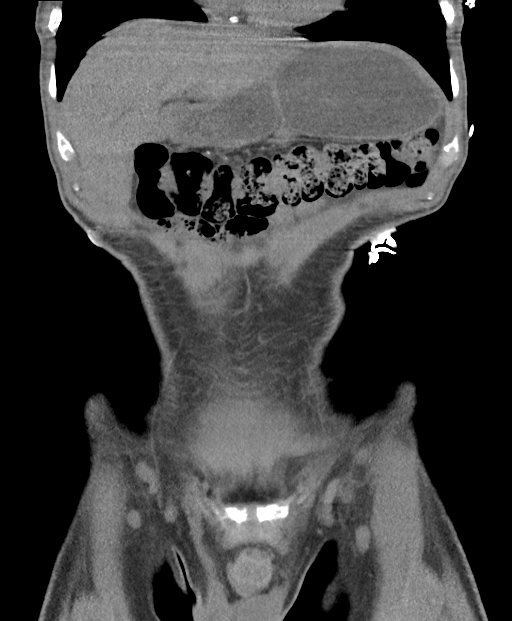
[im 36/80  soft-tissue]
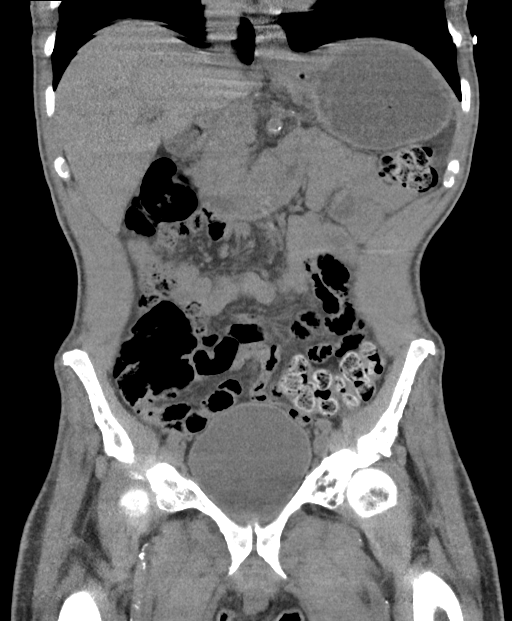
[im 44/80  soft-tissue]
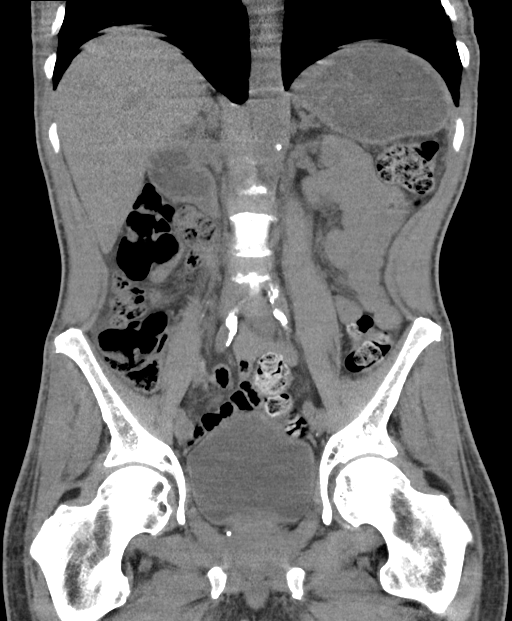

[15 of 46 positions shown; findings below may reference images not displayed]

FINDINGS: Lower chest:  Mild circumferential wall thickening distal esophagus.

Hepatobiliary: No focal abnormality in the liver on this study
without intravenous contrast. There is no evidence for gallstones,
gallbladder wall thickening, or pericholecystic fluid. No
intrahepatic or extrahepatic biliary dilation.

Pancreas:  Status post distal gastrectomy.

Spleen: Status post splenectomy.

Adrenals/Urinary Tract: No adrenal nodule or mass. No hydronephrosis
in either kidney. No hydroureter. The urinary bladder appears normal
for the degree of distention.

Stomach/Bowel: Stomach is markedly distended with fluid. Duodenum
nondistended. Small bowel is nondilated. Terminal ileum is not well
seen. The appendix is normal. Moderate stool volume throughout the
colon.

Vascular/Lymphatic: There is abdominal aortic atherosclerosis
without aneurysm. No abdominal lymphadenopathy is evident although
assessment is limited by lack of intraabdominal fat, no intravenous
contrast material, and some underlying diffuse soft tissue edema. No
pelvic sidewall lymphadenopathy.

Reproductive: The prostate gland and seminal vesicles have normal
imaging features.

Other: There is some trace fluid in the right para colic gutter but
no substantial ascites evident.

Musculoskeletal: Bone windows reveal no worrisome lytic or sclerotic
osseous lesions.
IMPRESSION: 1. Limited study given lack of oral and IV contrast. Status post
distal pancreatectomy and splenectomy.
2. Marked distention of the stomach which is fluid-filled. Features
may be related to dysmotility or a component of outlet obstruction.
3. No evidence for bowel obstruction. No intraperitoneal free air.
Minimal intraperitoneal free fluid is identified.
4. Mild circumferential wall thickening distal esophagus.
Esophagitis would be a consideration.
5. Diffusely stool-filled colon. In the appropriate clinical
setting, this appearance could be compatible with constipation.

## 2018-08-03 ENCOUNTER — Other Ambulatory Visit: Payer: Self-pay

## 2018-08-03 ENCOUNTER — Observation Stay
Admission: EM | Admit: 2018-08-03 | Discharge: 2018-08-04 | Disposition: A | Discharge status: Home / Self Care | Payer: Medicare Other | Attending: Family Medicine | Admitting: Family Medicine

## 2018-08-03 ENCOUNTER — Emergency Department: Payer: Medicare Other

## 2018-08-03 DIAGNOSIS — Z794 Long term (current) use of insulin: Secondary | ICD-10-CM | POA: Insufficient documentation

## 2018-08-03 DIAGNOSIS — R739 Hyperglycemia, unspecified: Secondary | ICD-10-CM | POA: Diagnosis present

## 2018-08-03 DIAGNOSIS — E872 Acidosis, unspecified: Secondary | ICD-10-CM

## 2018-08-03 DIAGNOSIS — I110 Hypertensive heart disease with heart failure: Secondary | ICD-10-CM | POA: Insufficient documentation

## 2018-08-03 DIAGNOSIS — M545 Low back pain, unspecified: Secondary | ICD-10-CM

## 2018-08-03 DIAGNOSIS — R159 Full incontinence of feces: Secondary | ICD-10-CM | POA: Diagnosis not present

## 2018-08-03 DIAGNOSIS — Z7901 Long term (current) use of anticoagulants: Secondary | ICD-10-CM | POA: Insufficient documentation

## 2018-08-03 DIAGNOSIS — R1084 Generalized abdominal pain: Secondary | ICD-10-CM

## 2018-08-03 DIAGNOSIS — R7989 Other specified abnormal findings of blood chemistry: Secondary | ICD-10-CM | POA: Diagnosis not present

## 2018-08-03 DIAGNOSIS — Z79899 Other long term (current) drug therapy: Secondary | ICD-10-CM | POA: Diagnosis not present

## 2018-08-03 DIAGNOSIS — E878 Other disorders of electrolyte and fluid balance, not elsewhere classified: Secondary | ICD-10-CM | POA: Diagnosis not present

## 2018-08-03 DIAGNOSIS — I2510 Atherosclerotic heart disease of native coronary artery without angina pectoris: Secondary | ICD-10-CM | POA: Diagnosis not present

## 2018-08-03 DIAGNOSIS — R778 Other specified abnormalities of plasma proteins: Secondary | ICD-10-CM

## 2018-08-03 DIAGNOSIS — I4891 Unspecified atrial fibrillation: Secondary | ICD-10-CM | POA: Insufficient documentation

## 2018-08-03 DIAGNOSIS — E1140 Type 2 diabetes mellitus with diabetic neuropathy, unspecified: Secondary | ICD-10-CM | POA: Insufficient documentation

## 2018-08-03 DIAGNOSIS — Z87891 Personal history of nicotine dependence: Secondary | ICD-10-CM | POA: Diagnosis not present

## 2018-08-03 DIAGNOSIS — Z7982 Long term (current) use of aspirin: Secondary | ICD-10-CM | POA: Insufficient documentation

## 2018-08-03 DIAGNOSIS — Z9114 Patient's other noncompliance with medication regimen: Secondary | ICD-10-CM | POA: Insufficient documentation

## 2018-08-03 DIAGNOSIS — Z23 Encounter for immunization: Secondary | ICD-10-CM | POA: Diagnosis not present

## 2018-08-03 DIAGNOSIS — G894 Chronic pain syndrome: Secondary | ICD-10-CM | POA: Insufficient documentation

## 2018-08-03 DIAGNOSIS — Z8719 Personal history of other diseases of the digestive system: Secondary | ICD-10-CM | POA: Diagnosis not present

## 2018-08-03 LAB — URINALYSIS, COMPLETE (UACMP) WITH MICROSCOPIC
BACTERIA UA: NONE SEEN
Bilirubin Urine: NEGATIVE
Hgb urine dipstick: NEGATIVE
KETONES UR: NEGATIVE mg/dL
LEUKOCYTES UA: NEGATIVE
NITRITE: NEGATIVE
PH: 8 (ref 5.0–8.0)
PROTEIN: NEGATIVE mg/dL
SQUAMOUS EPITHELIAL / LPF: NONE SEEN (ref 0–5)
Specific Gravity, Urine: 1.027 (ref 1.005–1.030)

## 2018-08-03 LAB — CBC WITH DIFFERENTIAL/PLATELET
Abs Immature Granulocytes: 0.02 10*3/uL (ref 0.00–0.07)
BASOS ABS: 0 10*3/uL (ref 0.0–0.1)
Basophils Relative: 1 %
EOS ABS: 0.1 10*3/uL (ref 0.0–0.5)
EOS PCT: 2 %
HEMATOCRIT: 34.2 % — AB (ref 39.0–52.0)
HEMOGLOBIN: 11.3 g/dL — AB (ref 13.0–17.0)
Immature Granulocytes: 0 %
Lymphocytes Relative: 44 %
Lymphs Abs: 3.2 10*3/uL (ref 0.7–4.0)
MCH: 29.2 pg (ref 26.0–34.0)
MCHC: 33 g/dL (ref 30.0–36.0)
MCV: 88.4 fL (ref 80.0–100.0)
MONO ABS: 0.8 10*3/uL (ref 0.1–1.0)
MONOS PCT: 12 %
NEUTROS PCT: 41 %
NRBC: 0 % (ref 0.0–0.2)
Neutro Abs: 3 10*3/uL (ref 1.7–7.7)
Platelets: 326 10*3/uL (ref 150–400)
RBC: 3.87 MIL/uL — ABNORMAL LOW (ref 4.22–5.81)
RDW: 13.2 % (ref 11.5–15.5)
WBC: 7.2 10*3/uL (ref 4.0–10.5)

## 2018-08-03 LAB — BASIC METABOLIC PANEL
Anion gap: 12 (ref 5–15)
BUN: 14 mg/dL (ref 8–23)
CHLORIDE: 95 mmol/L — AB (ref 98–111)
CO2: 24 mmol/L (ref 22–32)
CREATININE: 1.05 mg/dL (ref 0.61–1.24)
Calcium: 9 mg/dL (ref 8.9–10.3)
GFR calc non Af Amer: >60 mL/min (ref >60)
GLUCOSE: 560 mg/dL — AB (ref 70–99)
Potassium: 4.4 mmol/L (ref 3.5–5.1)
Sodium: 131 mmol/L — ABNORMAL LOW (ref 135–145)

## 2018-08-03 LAB — LACTIC ACID, PLASMA
Lactic Acid, Venous: 3.1 mmol/L (ref 0.5–1.9)
Lactic Acid, Venous: 1.2 mmol/L (ref 0.5–1.9)

## 2018-08-03 LAB — HEPATIC FUNCTION PANEL
ALT: 14 U/L (ref 0–44)
AST: 22 U/L (ref 15–41)
Albumin: 3.6 g/dL (ref 3.5–5.0)
Alkaline Phosphatase: 29 U/L — ABNORMAL LOW (ref 38–126)
Total Bilirubin: 0.5 mg/dL (ref 0.3–1.2)
Total Protein: 6.9 g/dL (ref 6.5–8.1)

## 2018-08-03 LAB — URINE DRUG SCREEN, QUALITATIVE (ARMC ONLY)
AMPHETAMINES, UR SCREEN: NOT DETECTED
Barbiturates, Ur Screen: NOT DETECTED
Benzodiazepine, Ur Scrn: NOT DETECTED
Cannabinoid 50 Ng, Ur ~~LOC~~: NOT DETECTED
Cocaine Metabolite,Ur ~~LOC~~: NOT DETECTED
MDMA (ECSTASY) UR SCREEN: NOT DETECTED
Methadone Scn, Ur: NOT DETECTED
Opiate, Ur Screen: POSITIVE — AB
PHENCYCLIDINE (PCP) UR S: NOT DETECTED
Tricyclic, Ur Screen: NOT DETECTED

## 2018-08-03 LAB — GLUCOSE, CAPILLARY
GLUCOSE-CAPILLARY: 322 mg/dL — AB (ref 70–99)
GLUCOSE-CAPILLARY: 117 mg/dL — AB (ref 70–99)
Glucose-Capillary: 161 mg/dL — ABNORMAL HIGH (ref 70–99)

## 2018-08-03 LAB — PROTIME-INR
INR: 0.95
PROTHROMBIN TIME: 12.6 s (ref 11.4–15.2)

## 2018-08-03 LAB — TROPONIN I

## 2018-08-03 LAB — LIPASE, BLOOD: Lipase: 20 U/L (ref 11–51)

## 2018-08-03 LAB — TSH: TSH: 2.17 u[IU]/mL (ref 0.350–4.500)

## 2018-08-03 LAB — HEMOGLOBIN A1C
Hgb A1c MFr Bld: 13.6 % — ABNORMAL HIGH (ref 4.8–5.6)
Mean Plasma Glucose: 343.62 mg/dL

## 2018-08-03 MED ORDER — INSULIN GLARGINE 100 UNIT/ML ~~LOC~~ SOLN
20.0000 [IU] | Freq: Once | SUBCUTANEOUS | Status: AC
  Administered 2018-08-03: 20 [IU] via SUBCUTANEOUS
  Filled 2018-08-03: qty 0.2

## 2018-08-03 MED ORDER — TRAZODONE HCL 50 MG PO TABS: 50.0000 mg | ORAL_TABLET | Freq: Every evening | ORAL | Status: DC | PRN

## 2018-08-03 MED ORDER — POLYETHYLENE GLYCOL 3350 17 G PO PACK: 17.0000 g | PACK | Freq: Every day | ORAL | Status: DC | PRN

## 2018-08-03 MED ORDER — GADOBUTROL 1 MMOL/ML IV SOLN
6.0000 mL | Freq: Once | INTRAVENOUS | Status: AC | PRN
  Administered 2018-08-03: 6 mL via INTRAVENOUS

## 2018-08-03 MED ORDER — IOPAMIDOL (ISOVUE-370) INJECTION 76%
100.0000 mL | Freq: Once | INTRAVENOUS | Status: AC | PRN
  Administered 2018-08-03: 100 mL via INTRAVENOUS

## 2018-08-03 MED ORDER — TRAMADOL HCL 50 MG PO TABS: 50.0000 mg | ORAL_TABLET | Freq: Three times a day (TID) | ORAL | Status: DC | PRN

## 2018-08-03 MED ORDER — INFLUENZA VAC SPLIT QUAD 0.5 ML IM SUSY
0.5000 mL | PREFILLED_SYRINGE | INTRAMUSCULAR | Status: AC
  Administered 2018-08-04: 0.5 mL via INTRAMUSCULAR
  Filled 2018-08-03: qty 0.5

## 2018-08-03 MED ORDER — TRAMADOL HCL 50 MG PO TABS: 100.0000 mg | ORAL_TABLET | Freq: Three times a day (TID) | ORAL | Status: DC | PRN

## 2018-08-03 MED ORDER — INSULIN GLARGINE 100 UNIT/ML ~~LOC~~ SOLN
30.0000 [IU] | Freq: Every day | SUBCUTANEOUS | Status: DC
  Administered 2018-08-03: 22:00:00 30 [IU] via SUBCUTANEOUS
  Filled 2018-08-03 (×2): qty 0.3

## 2018-08-03 MED ORDER — DOCUSATE SODIUM 100 MG PO CAPS
100.0000 mg | ORAL_CAPSULE | Freq: Two times a day (BID) | ORAL | Status: DC
  Administered 2018-08-04: 10:00:00 100 mg via ORAL
  Filled 2018-08-03 (×2): qty 1

## 2018-08-03 MED ORDER — ACETAMINOPHEN 325 MG PO TABS: 325.0000 mg | ORAL_TABLET | Freq: Four times a day (QID) | ORAL | Status: DC | PRN

## 2018-08-03 MED ORDER — INSULIN ASPART 100 UNIT/ML ~~LOC~~ SOLN
0.0000 [IU] | Freq: Three times a day (TID) | SUBCUTANEOUS | Status: DC
  Administered 2018-08-03: 4 [IU] via SUBCUTANEOUS
  Administered 2018-08-03: 15 [IU] via SUBCUTANEOUS
  Filled 2018-08-03 (×2): qty 1

## 2018-08-03 MED ORDER — INSULIN ASPART 100 UNIT/ML ~~LOC~~ SOLN
5.0000 [IU] | Freq: Three times a day (TID) | SUBCUTANEOUS | Status: DC
  Administered 2018-08-03 – 2018-08-04 (×3): 5 [IU] via SUBCUTANEOUS
  Filled 2018-08-03 (×3): qty 1

## 2018-08-03 MED ORDER — INSULIN ASPART 100 UNIT/ML ~~LOC~~ SOLN: 0.0000 [IU] | Freq: Every day | SUBCUTANEOUS | Status: DC

## 2018-08-03 MED ORDER — SODIUM CHLORIDE 0.9 % IV SOLN
1.0000 g | Freq: Once | INTRAVENOUS | Status: AC
  Administered 2018-08-03: 1 g via INTRAVENOUS
  Filled 2018-08-03: qty 10

## 2018-08-03 MED ORDER — ONDANSETRON HCL 4 MG PO TABS: 4.0000 mg | ORAL_TABLET | Freq: Four times a day (QID) | ORAL | Status: DC | PRN

## 2018-08-03 MED ORDER — METOPROLOL TARTRATE 25 MG PO TABS
25.0000 mg | ORAL_TABLET | Freq: Two times a day (BID) | ORAL | Status: DC
  Administered 2018-08-03 – 2018-08-04 (×2): 25 mg via ORAL
  Filled 2018-08-03 (×2): qty 1

## 2018-08-03 MED ORDER — BISACODYL 10 MG RE SUPP
10.0000 mg | Freq: Every day | RECTAL | Status: DC | PRN
  Filled 2018-08-03: qty 1

## 2018-08-03 MED ORDER — HYDRALAZINE HCL 20 MG/ML IJ SOLN: 10.0000 mg | INTRAMUSCULAR | Status: DC | PRN

## 2018-08-03 MED ORDER — PREMIER PROTEIN SHAKE
11.0000 [oz_av] | Freq: Two times a day (BID) | ORAL | Status: DC
  Administered 2018-08-03 – 2018-08-04 (×2): 11 [oz_av] via ORAL

## 2018-08-03 MED ORDER — INSULIN ASPART 100 UNIT/ML ~~LOC~~ SOLN
10.0000 [IU] | Freq: Three times a day (TID) | SUBCUTANEOUS | Status: DC
  Administered 2018-08-03: 10 [IU] via SUBCUTANEOUS
  Filled 2018-08-03: qty 1

## 2018-08-03 MED ORDER — AMITRIPTYLINE HCL 50 MG PO TABS
50.0000 mg | ORAL_TABLET | Freq: Every day | ORAL | Status: DC
  Administered 2018-08-03: 50 mg via ORAL
  Filled 2018-08-03: qty 2
  Filled 2018-08-03 (×2): qty 1

## 2018-08-03 MED ORDER — ADULT MULTIVITAMIN W/MINERALS CH: 1.0000 | ORAL_TABLET | Freq: Every day | ORAL | Status: DC

## 2018-08-03 MED ORDER — SODIUM CHLORIDE 0.9 % IV SOLN
INTRAVENOUS | Status: DC
  Administered 2018-08-03 – 2018-08-04 (×4): via INTRAVENOUS

## 2018-08-03 MED ORDER — ONDANSETRON HCL 4 MG/2ML IJ SOLN: 4.0000 mg | Freq: Four times a day (QID) | INTRAMUSCULAR | Status: DC | PRN

## 2018-08-03 MED ORDER — ADULT MULTIVITAMIN W/MINERALS CH
1.0000 | ORAL_TABLET | Freq: Every day | ORAL | Status: DC
  Administered 2018-08-03 – 2018-08-04 (×2): 1 via ORAL
  Filled 2018-08-03 (×2): qty 1

## 2018-08-03 MED ORDER — DICLOFENAC SODIUM 1 % TD GEL
2.0000 g | Freq: Four times a day (QID) | TRANSDERMAL | Status: DC
  Administered 2018-08-03 – 2018-08-04 (×3): 2 g via TOPICAL
  Filled 2018-08-03: qty 100

## 2018-08-03 MED ORDER — SODIUM CHLORIDE 0.9 % IV BOLUS
1000.0000 mL | Freq: Once | INTRAVENOUS | Status: AC
  Administered 2018-08-03: 1000 mL via INTRAVENOUS

## 2018-08-03 MED ORDER — HALOPERIDOL LACTATE 5 MG/ML IJ SOLN
2.5000 mg | Freq: Once | INTRAMUSCULAR | Status: AC
  Administered 2018-08-03: 2.5 mg via INTRAVENOUS
  Filled 2018-08-03: qty 1

## 2018-08-03 MED ORDER — CYCLOBENZAPRINE HCL 10 MG PO TABS: 5.0000 mg | ORAL_TABLET | Freq: Three times a day (TID) | ORAL | Status: DC | PRN

## 2018-08-03 MED ORDER — APIXABAN 5 MG PO TABS
5.0000 mg | ORAL_TABLET | Freq: Two times a day (BID) | ORAL | Status: DC
  Administered 2018-08-03 – 2018-08-04 (×2): 5 mg via ORAL
  Filled 2018-08-03 (×2): qty 1

## 2018-08-03 MED ORDER — FLEET ENEMA 7-19 GM/118ML RE ENEM: 1.0000 | ENEMA | Freq: Once | RECTAL | Status: DC | PRN

## 2018-08-03 MED ORDER — GABAPENTIN 100 MG PO CAPS
100.0000 mg | ORAL_CAPSULE | Freq: Three times a day (TID) | ORAL | Status: DC
  Administered 2018-08-03 – 2018-08-04 (×3): 100 mg via ORAL
  Filled 2018-08-03 (×3): qty 1

## 2018-08-03 MED ORDER — MORPHINE SULFATE (PF) 4 MG/ML IV SOLN
8.0000 mg | Freq: Once | INTRAVENOUS | Status: AC
Start: 2018-08-03 — End: 2018-08-03
  Administered 2018-08-03: 8 mg via INTRAVENOUS
  Filled 2018-08-03: qty 2

## 2018-08-03 MED ORDER — ATORVASTATIN CALCIUM 20 MG PO TABS
40.0000 mg | ORAL_TABLET | Freq: Every evening | ORAL | Status: DC
  Administered 2018-08-03: 18:00:00 40 mg via ORAL
  Filled 2018-08-03: qty 2

## 2018-08-03 NOTE — ED Notes (Signed)
Patient transported to room 132

## 2018-08-03 NOTE — ED Notes (Signed)
Patient transported to CT 

## 2018-08-03 NOTE — Progress Notes (Signed)
Vbg results  Notified webster , Ena Dawley   RN

## 2018-08-03 NOTE — ED Notes (Signed)
AAOx3.  Skin warm and dry.  NAD 

## 2018-08-03 NOTE — H&P (Signed)
[facility] [place] NAME: Geral Coker    MR#:  [id number]  DATE OF BIRTH:  02-08-1956  DATE OF ADMISSION:  08/03/2018  PRIMARY CARE PHYSICIAN: [facility]   REQUESTING/REFERRING PHYSICIAN:   CHIEF COMPLAINT:   Chief Complaint  Patient presents with  . Abdominal Pain    HISTORY OF PRESENT ILLNESS: Auden Tatar  is a 62 y.o. male with a known history per below presents to the emergency room via EMS with numerous complaints which includes acute on chronic worsening diffuse body pain-abdominal pain/back pain/bilateral leg/feet pain, toothache, loose teeth, bilateral feet numbness, inability to urinate per records-patient denies this/able to urinate but pain with urination, fecal incontinence per records-patient denies this, rectal bleeding, nausea without emesis, chills, admits to being noncompliant with medications, states that he does take his insulin as recommended, patient states that this diffuse body pain all over is made better with pain medicine, patient states that he has been unable to go to a pain specialist as yet, states that he is only able to get a few pain pills at a time from local doctors, in the emergency room extensive work-up was done which was largely unimpressive, glucose was 560, sodium 131, chloride 95, blood pressure 182/89, patient is now been admitted for acute hyperglycemia due to uncontrolled diabetes mellitus type 2, acute on chronic pain syndrome-suspected opioid drug-seeking behavior, and noncompliance with medical management.  PAST MEDICAL HISTORY:   Past Medical History:  Diagnosis Date  . Atrial fibrillation ([place])    per patient  . Cancer ([place])   . CHF (congestive heart failure) ([place])   . Chronic back pain   . Chronic leg pain   . Coronary artery disease   . Diabetes mellitus without complication ([place])   . Hypertension   . Neuropathy     PAST SURGICAL HISTORY:  Past Surgical  History:  Procedure Laterality Date  . CORONARY ANGIOPLASTY WITH STENT PLACEMENT    . ESOPHAGOGASTRODUODENOSCOPY N/A 09/11/2017   Procedure: ESOPHAGOGASTRODUODENOSCOPY (EGD);  Surgeon: Virgel Manifold, MD;  Location: [facility] ENDOSCOPY;  Service: Endoscopy;  Laterality: N/A;  . pancreas removed      SOCIAL HISTORY:  Social History   Tobacco Use  . Smoking status: Former Smoker    Packs/day: 0.50  . Smokeless tobacco: Never Used  Substance Use Topics  . Alcohol use: No    Alcohol/week: 0.0 standard drinks    FAMILY HISTORY:  Family History  Problem Relation Age of Onset  . CAD Mother   . Diabetes Mellitus II Father     DRUG ALLERGIES: No Known Allergies  REVIEW OF SYSTEMS:   CONSTITUTIONAL: No fever, +fatigue, weakness.  EYES: No blurred or double vision.  EARS, NOSE, AND THROAT: No tinnitus or ear pain.  RESPIRATORY: + cough, shortness of breath, wheezing   CARDIOVASCULAR: + chest pain, no orthopnea, edema.  GASTROINTESTINAL: + nausea, abdominal pain.  GENITOURINARY: + dysuria, hematuria.  ENDOCRINE: + polyuria, nocturia,  HEMATOLOGY: No anemia, easy bruising or bleeding SKIN: No rash or lesion. MUSCULOSKELETAL: + Diffuse joint pain, back pain   NEUROLOGIC: No tingling, +numbness bilateral lower extremities, + generalized weakness.  PSYCHIATRY: No anxiety or depression.   MEDICATIONS AT HOME:  Prior to Admission medications   Medication Sig Start Date End Date Taking? Authorizing Provider  acetaminophen (TYLENOL) 325 MG tablet Take 1 tablet (325 mg total) by mouth every 6 (six) hours as needed for mild pain (or Fever >/= 101).  01/11/18  Yes Gouru, Illene Silver, MD  aspirin 81 MG chewable tablet Chew 1 tablet (81 mg total) by mouth daily. 01/12/18  Yes Gouru, Aruna, MD  insulin glargine (LANTUS) 100 UNIT/ML injection Inject 0.22 mLs (22 Units total) into the skin daily. Patient taking differently: Inject 30 Units into the skin daily.  01/12/18  Yes Gouru, Illene Silver, MD  Multiple  Vitamin (MULTIVITAMIN WITH MINERALS) TABS tablet Take 1 tablet by mouth daily. 01/12/18  Yes Gouru, Illene Silver, MD  ACCU-CHEK AVIVA PLUS test strip U ONCE TO BID UTD 09/17/17   [provider]  ACCU-CHEK SOFTCLIX LANCETS lancets TEST 1-2 XD 09/17/17   [provider]  apixaban (ELIQUIS) 5 MG TABS tablet Take 1 tablet (5 mg total) by mouth 2 (two) times daily. Patient not taking: Reported on 08/03/2018 01/11/18   Nicholes Mango, MD  atorvastatin (LIPITOR) 40 MG tablet Take 1 tablet (40 mg total) by mouth every evening. Patient not taking: Reported on 08/03/2018 01/11/18   Nicholes Mango, MD  Blood Glucose Monitoring Suppl (ACCU-CHEK AVIVA PLUS) w/Device KIT U TO TEST ONCE TO BID 09/17/17   [provider]  GLOBAL EASE INJECT PEN NEEDLES 31G X 5 MM MISC Inject 1 Units into the skin daily. 09/11/17   Hillary Bow, MD  insulin aspart (NOVOLOG) 100 UNIT/ML injection Inject 0-15 Units into the skin 3 (three) times daily with meals. CBG < 70: implement hypoglycemia protocol CBG 70 - 120: 0 units CBG 121 - 150: 2 units CBG 151 - 200: 3 units CBG 201 - 250: 5 units CBG 251 - 300: 8 units CBG 301 - 350: 11 units CBG 351 - 400: 15 units CBG > 400: call MD and obtain STAT lab verification Patient taking differently: Inject 0-30 Units into the skin 3 (three) times daily with meals. CBG < 70: implement hypoglycemia protocol CBG 70 - 120: 0 units CBG 121 - 150: 2 units CBG 151 - 200: 3 units CBG 201 - 250: 5 units CBG 251 - 300: 8 units CBG 301 - 350: 11 units CBG 351 - 400: 15 units CBG > 400: call MD and obtain STAT lab verification 01/11/18   Gouru, Illene Silver, MD  metoprolol tartrate (LOPRESSOR) 25 MG tablet Take 1 tablet (25 mg total) by mouth 2 (two) times daily. Patient not taking: Reported on 08/03/2018 01/11/18   Nicholes Mango, MD  Needles & Syringes MISC Use as directed 01/31/16   Jola Schmidt, MD  protein supplement shake (PREMIER PROTEIN) LIQD Take 325 mLs (11 oz total) by mouth 2  (two) times daily between meals. 01/12/18   Gouru, Illene Silver, MD  VOLTAREN 1 % GEL Apply 2 g topically 4 (four) times daily.  11/11/17   [provider]      PHYSICAL EXAMINATION:   VITAL SIGNS: Blood pressure (!) 149/75, pulse 73, temperature 98.9 F (37.2 C), temperature source Rectal, resp. rate 14, height 5' 11"  (1.803 m), weight 68 kg, SpO2 99 %.  GENERAL:  62 y.o.-year-old patient lying in the bed with no acute distress.  Frail-appearing EYES: Pupils equal, round, reactive to light and accommodation. No scleral icterus. Extraocular muscles intact.  HEENT: Head atraumatic, normocephalic. Oropharynx and nasopharynx clear.  NECK:  Supple, no jugular venous distention. No thyroid enlargement, no tenderness.  LUNGS: Normal breath sounds bilaterally, no wheezing, rales,rhonchi or crepitation. No use of accessory muscles of respiration.  CARDIOVASCULAR: S1, S2 normal. No murmurs, rubs, or gallops.  ABDOMEN: Soft, nontender, nondistended. Bowel sounds present. No organomegaly or mass.  EXTREMITIES: No pedal edema, cyanosis, or clubbing.  NEUROLOGIC: Cranial nerves II through XII are intact. Muscle strength 5/5 in all extremities. Sensation intact. Gait not checked.  PSYCHIATRIC: The patient is alert and oriented x 3.  Anxious SKIN: No obvious rash, lesion, or ulcer.   LABORATORY PANEL:   CBC Recent Labs  Lab 08/03/18 0407  WBC 7.2  HGB 11.3*  HCT 34.2*  PLT 326  MCV 88.4  MCH 29.2  MCHC 33.0  RDW 13.2  LYMPHSABS 3.2  MONOABS 0.8  EOSABS 0.1  BASOSABS 0.0   ------------------------------------------------------------------------------------------------------------------  Chemistries  Recent Labs  Lab 08/03/18 0407  NA 131*  K 4.4  CL 95*  CO2 24  GLUCOSE 560*  BUN 14  CREATININE 1.05  CALCIUM 9.0  AST 22  ALT 14  ALKPHOS 29*  BILITOT 0.5    ------------------------------------------------------------------------------------------------------------------ estimated creatinine clearance is 69.3 mL/min (by C-G formula based on SCr of 1.05 mg/dL). ------------------------------------------------------------------------------------------------------------------ No results for input(s): TSH, T4TOTAL, T3FREE, THYROIDAB in the last 72 hours.  Invalid input(s): FREET3   Coagulation profile Recent Labs  Lab 08/03/18 0407  INR 0.95   ------------------------------------------------------------------------------------------------------------------- No results for input(s): DDIMER in the last 72 hours. -------------------------------------------------------------------------------------------------------------------  Cardiac Enzymes Recent Labs  Lab 08/03/18 0407  TROPONINI <0.03   ------------------------------------------------------------------------------------------------------------------ Invalid input(s): POCBNP  ---------------------------------------------------------------------------------------------------------------  Urinalysis    Component Value Date/Time   COLORURINE COLORLESS (A) 08/03/2018 0443   APPEARANCEUR CLEAR (A) 08/03/2018 0443   LABSPEC 1.027 08/03/2018 0443   PHURINE 8.0 08/03/2018 0443   GLUCOSEU >=500 (A) 08/03/2018 0443   HGBUR NEGATIVE 08/03/2018 [place] 08/03/2018 0443   KETONESUR NEGATIVE 08/03/2018 0443   PROTEINUR NEGATIVE 08/03/2018 0443   NITRITE NEGATIVE 08/03/2018 0443   LEUKOCYTESUR NEGATIVE 08/03/2018 0443     RADIOLOGY: Mr Thoracic Spine W Wo Contrast  Result Date: 08/03/2018 CLINICAL DATA:  Back pain.  Possible cauda equina syndrome. EXAM: MRI THORACIC AND LUMBAR SPINE WITHOUT AND WITH CONTRAST TECHNIQUE: Multiplanar and multiecho pulse sequences of the thoracic and lumbar spine were obtained without and with intravenous contrast. CONTRAST:  6 mL  Gadavist COMPARISON:  None. FINDINGS: MRI THORACIC SPINE FINDINGS Alignment:  Physiologic. Vertebrae: No fracture, evidence of discitis, or bone lesion. Cord:  Normal signal and morphology. Paraspinal and other soft tissues: Negative. Disc levels: There are small disc protrusions at T3-4, T5-6, T6-7, T7-8, T8-9 without spinal canal stenosis. The T8-9 protrusion contacts the ventral spinal cord. No neural foraminal stenosis. At T4-5, there is a small osteophyte at the dorsal spinal canal narrows the dorsal thecal sac. No abnormal contrast enhancement. MRI LUMBAR SPINE FINDINGS Segmentation:  Standard. Alignment:  Physiologic. Vertebrae: There is degenerative endplate signal change at L5-S1 with a large inferior endplate Schmorl's node. No acute fracture. No discitis-osteomyelitis. Conus medullaris: Extends to the T12-L1 level and appears normal. Paraspinal and other soft tissues: Negative. Disc levels: The T12-L4 disc levels are normal. L4-L5: Small central disc protrusion with narrowing of the left lateral recess. No central spinal canal or neural foraminal stenosis. L5-S1: Small disc osteophyte complex. No central spinal canal stenosis. Mild bilateral neural foraminal stenosis. No abnormal contrast enhancement. IMPRESSION: 1. No spinal cord or cauda equina compression. 2. Mild multilevel thoracic degenerative changes with multiple small disc protrusions that do not cause spinal canal stenosis. The T8-9 disc contacts the ventral spinal cord. 3. L5-S1 disc space narrowing and endplate spurring with mild bilateral neural foraminal stenosis. Electronically Signed   By: Cletus Gash.D.  On: 08/03/2018 07:02   Mr Lumbar Spine W Wo Contrast  Result Date: 08/03/2018 CLINICAL DATA:  Back pain.  Possible cauda equina syndrome. EXAM: MRI THORACIC AND LUMBAR SPINE WITHOUT AND WITH CONTRAST TECHNIQUE: Multiplanar and multiecho pulse sequences of the thoracic and lumbar spine were obtained without and with intravenous  contrast. CONTRAST:  6 mL Gadavist COMPARISON:  None. FINDINGS: MRI THORACIC SPINE FINDINGS Alignment:  Physiologic. Vertebrae: No fracture, evidence of discitis, or bone lesion. Cord:  Normal signal and morphology. Paraspinal and other soft tissues: Negative. Disc levels: There are small disc protrusions at T3-4, T5-6, T6-7, T7-8, T8-9 without spinal canal stenosis. The T8-9 protrusion contacts the ventral spinal cord. No neural foraminal stenosis. At T4-5, there is a small osteophyte at the dorsal spinal canal narrows the dorsal thecal sac. No abnormal contrast enhancement. MRI LUMBAR SPINE FINDINGS Segmentation:  Standard. Alignment:  Physiologic. Vertebrae: There is degenerative endplate signal change at L5-S1 with a large inferior endplate Schmorl's node. No acute fracture. No discitis-osteomyelitis. Conus medullaris: Extends to the T12-L1 level and appears normal. Paraspinal and other soft tissues: Negative. Disc levels: The T12-L4 disc levels are normal. L4-L5: Small central disc protrusion with narrowing of the left lateral recess. No central spinal canal or neural foraminal stenosis. L5-S1: Small disc osteophyte complex. No central spinal canal stenosis. Mild bilateral neural foraminal stenosis. No abnormal contrast enhancement. IMPRESSION: 1. No spinal cord or cauda equina compression. 2. Mild multilevel thoracic degenerative changes with multiple small disc protrusions that do not cause spinal canal stenosis. The T8-9 disc contacts the ventral spinal cord. 3. L5-S1 disc space narrowing and endplate spurring with mild bilateral neural foraminal stenosis. Electronically Signed   By: Ulyses Jarred M.D.   On: 08/03/2018 07:02   Ct Angio Chest/abd/pel For Dissection W And/or W/wo  Result Date: 08/03/2018 CLINICAL DATA:  Abdominal pain radiating to both legs.  Chest pain. EXAM: CT ANGIOGRAPHY CHEST, ABDOMEN AND PELVIS TECHNIQUE: Multidetector CT imaging through the chest, abdomen and pelvis was performed  using the standard protocol during bolus administration of intravenous contrast. Multiplanar reconstructed images and MIPs were obtained and reviewed to evaluate the vascular anatomy. CONTRAST:  141m ISOVUE-370 IOPAMIDOL (ISOVUE-370) INJECTION 76% COMPARISON:  CT abdomen pelvis 09/05/2017 FINDINGS: CTA CHEST FINDINGS Cardiovascular: --Heart: The heart size is normal. There is nopericardial effusion. There is coronary artery calcification. --Aorta: The course and caliber of the thoracic aorta are normal. There is mild aortic atherosclerotic calcification. Precontrast images show no aortic intramural hematoma. There is no blood pool, dissection or penetrating ulcer demonstrated on arterial phase postcontrast imaging. There is a conventional 3 vessel aortic arch branching pattern. The proximal arch vessels are widely patent. --Pulmonary Arteries: Contrast timing is optimized for preferential opacification of the aorta. Within that limitation, normal central pulmonary arteries. Mediastinum/Nodes: No mediastinal, hilar or axillary lymphadenopathy. The visualized thyroid and thoracic esophageal course are unremarkable. Lungs/Pleura: No pulmonary nodules or masses. No pleural effusion or pneumothorax. No focal airspace consolidation. No focal pleural abnormality. Musculoskeletal: No chest wall abnormality. No acute osseous findings. Review of the MIP images confirms the above findings. CTA ABDOMEN AND PELVIS FINDINGS VASCULAR Aorta: Normal caliber aorta without aneurysm, dissection, vasculitis or hemodynamically significant stenosis. There is mild mixed density aortic atherosclerosis. No hemodynamically significant stenosis. Celiac: No aneurysm, dissection or hemodynamically significant stenosis. Normal branching pattern. SMA: There is mild calcification near the origin.  Patent distally. Renals: Single renal arteries bilaterally. No aneurysm, dissection, stenosis or evidence of fibromuscular dysplasia. IMA: Patent  without  abnormality. Inflow: No aneurysm, stenosis or dissection. Veins: Normal course and caliber of the major veins. Assessment is otherwise limited by the arterial dominant contrast phase. Review of the MIP images confirms the above findings. NON-VASCULAR Hepatobiliary: Normal hepatic contours and density. No visible biliary dilatation. Normal gallbladder. Pancreas: The distal pancreas has been resected. Spleen: The spleen is surgically absent. Adrenals/Urinary Tract: --Adrenal glands: Normal. --Right kidney/ureter: No hydronephrosis or perinephric stranding. No nephrolithiasis. No obstructing ureteral stones. --Left kidney/ureter: No hydronephrosis or perinephric stranding. No nephrolithiasis. No obstructing ureteral stones. --Urinary bladder: Unremarkable. Stomach/Bowel: --Stomach/Duodenum: No hiatal hernia or other gastric abnormality. Normal duodenal course and caliber. --Small bowel: No dilatation or inflammation. --Colon: No focal abnormality. --Appendix: Normal. Lymphatic:  No abdominal or pelvic lymphadenopathy. Reproductive: Normal prostate and seminal vesicles. Musculoskeletal. Degenerative changes greatest at the L5-S1 disc level. Other: None. Review of the MIP images confirms the above findings. . IMPRESSION: 1. No acute aortic syndrome. 2. Coronary artery and aortic atherosclerosis (ICD10-I70.0). 3. Remote distal pancreatectomy and splenectomy. Electronically Signed   By: Ulyses Jarred M.D.   On: 08/03/2018 05:08    EKG: Orders placed or performed during the hospital encounter of 08/03/18  . ED EKG  . ED EKG  . EKG 12-Lead  . EKG 12-Lead  . EKG 12-Lead  . EKG 12-Lead  . EKG 12-Lead  . EKG 12-Lead    IMPRESSION AND PLAN: *Acute hyperglycemia secondary to uncontrolled diabetes myelitis type II Referred to the observation unit, IV fluids for rehydration, diabetic educator while in house, insulin with meals, Lantus at bedtime, sliding scale insulin with Accu-Cheks per routine, check  hemoglobin A1c determine level of control, carbohydrate consistent diet  *Acute accelerated hypertension Due to medication noncompliance Resume Lopressor, hydralazine PRN, vitals per routine, make changes as per necessary  *Acute pseudohyponatremia with hypochloremia IV fluids for rehydration, BMP in the morning  *Acute on chronic pain syndrome Suspect due to opioid drug-seeking behavior We will try to limit opioids as much as possible while in house, will try to maximize non-habit-forming medications in the form of Neurontin, Elavil, Flexeril, Tylenol for pain, will need referral to pain management status post discharge for continued medical management  *Acute lactic acidosis Most likely secondary to hyperglycemia Resolved with IV fluids rehydration    All the records are reviewed and case discussed with ED provider. Management plans discussed with the patient, family and they are in agreement.  CODE STATUS:full Code Status History    Date Active Date Inactive Code Status Order ID Comments User Context   01/09/2018 1025 01/11/2018 1932 Full Code [id number]  Harrie Foreman, MD Inpatient   09/05/2017 1559 09/11/2017 1928 Full Code [id number]  Fritzi Mandes, MD Inpatient   08/24/2017 0238 08/29/2017 2131 Full Code [id number]  Lance Coon, MD Inpatient   08/10/2015 0822 08/11/2015 1947 Full Code [id number]  Harrie Foreman, MD Inpatient       TOTAL TIME TAKING CARE OF THIS PATIENT: 45 minutes.    Avel Peace Kamron Portee M.D on 08/03/2018   Between 7am to 6pm - Pager - [phone]  After 6pm go to [email or web address] - password [email or web address] [facility]  Office  [phone]  CC: Primary care physician; [facility] [place]   Note: This dictation was prepared with Diplomatic Services operational officer dictation along with smaller phrase technology. Any transcriptional errors that result from this process are unintentional.

## 2018-08-03 NOTE — Progress Notes (Signed)
Family Meeting Note  Advance Directive:yes  Today a meeting took place with the Patient.  Patient is able to participate   The following clinical team members were present during this meeting:MD  The following were discussed:Patient's diagnosis:non-compliance, chronic pain , Patient's progosis: Unable to determine and Goals for treatment: Full Code  Additional follow-up to be provided: prn  Time spent during discussion:20 minutes  Gorden Harms, MD

## 2018-08-03 NOTE — Plan of Care (Signed)
Pt admitted today from the ED.  VSS. Pt reported back and abdominal pain upon admission, but declined pain meds.  Denies n/v.  CBG checked before meals. Novolog given per order.

## 2018-08-03 NOTE — ED Notes (Addendum)
Patient transported to MRI; pt pants removed

## 2018-08-03 NOTE — ED Notes (Signed)
Pt c/o abdominal that radiate into both legs, pt report 10/10 pain for the last 2 days from the waist to feet, blood in stool, and weakness to legs  Pt also c/o teeth problems but unable to do anything because of a need for a "heart doc to clear me", being unable to see a "cancer doc" at [place], and overall poor medical care  Pt hx of DM with right mid-foot amputation, 2 x MIs, and pancreatic CA,  Pt takes daily 81mg  ASA

## 2018-08-03 NOTE — ED Notes (Signed)
MRI tech ready for pt

## 2018-08-03 NOTE — ED Triage Notes (Signed)
Patient to ED via EMS from home for complaints of abdominal pain. History of pancreatitis, CA and diabetes. Patient is alert and oriented and able to answer questions appropriately. MD in room

## 2018-08-03 NOTE — ED Notes (Signed)
CRITICAL LAB: LACTIC is 3.1, [facility], Dr. Mable Paris notified, orders received

## 2018-08-03 NOTE — ED Notes (Signed)
Call to MRI, no answer

## 2018-08-03 NOTE — ED Provider Notes (Signed)
[facility] Emergency Department Provider Note  ____________________________________________   First MD Initiated Contact with Patient 08/03/18 [phone]     (approximate)  I have reviewed the triage vital signs and the nursing notes.   HISTORY  Chief Complaint Abdominal Pain    HPI Drake Sarr is a 62 y.o. male who comes to the emergency department via EMS with severe abdominal pain, back pain, and numbness and weakness in bilateral lower extremities.  His symptoms have been ongoing for the past 48 hours.  He has a complex past medical history including atrial flutter, hypertension, previous myocardial infarction, diabetes mellitus, and pancreatic cancer which was removed surgically 12 months ago.  His symptoms came on gradually 48 hours ago however over the past 12 hours have become more severe.  He says he cannot feel his feet and he has had difficulty initiating urination and defecation.  His symptoms are now severe.  Nothing seems to make them better or worse.  No fevers or chills.  He is unable to ambulate at this point.    Past Medical History:  Diagnosis Date  . Atrial fibrillation ([place])    per patient  . Cancer ([place])   . CHF (congestive heart failure) ([place])   . Chronic back pain   . Chronic leg pain   . Coronary artery disease   . Diabetes mellitus without complication ([place])   . Hypertension   . Neuropathy     Patient Active Problem List   Diagnosis Date Noted  . Protein-calorie malnutrition, severe 01/10/2018  . Sepsis ([place]) 01/09/2018  . DKA, type 2 ([place]) 09/05/2017  . Ileus ([place]) 08/24/2017  . Diabetes ([place]) 08/24/2017  . Atrial flutter ([place]) 08/24/2017  . CAD (coronary artery disease) 08/24/2017  . HTN (hypertension) 08/24/2017  . Atrial fibrillation ([place]) 08/24/2017  . Tobacco use disorder, severe, dependence 07/25/2017  . Wound dehiscence, surgical, initial encounter 06/24/2017  . Acute hematogenous osteomyelitis of right foot ([place])  06/20/2017  . 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 Esophagitis 06/20/2017  . Cellulitis of right lower extremity 06/10/2017  . Hypertension, poor control 06/10/2017  . Insulin dependent type 2 diabetes mellitus ([place]) 06/10/2017  . Chest pain 08/10/2015    Past Surgical History:  Procedure Laterality Date  . CORONARY ANGIOPLASTY WITH STENT PLACEMENT    . ESOPHAGOGASTRODUODENOSCOPY N/A 09/11/2017   Procedure: ESOPHAGOGASTRODUODENOSCOPY (EGD);  Surgeon: Virgel Manifold, MD;  Location[facility] ENDOSCOPY;  Service: Endoscopy;  Laterality: N/A;  . pancreas removed      Prior to Admission medications   Medication Sig Start Date End Date Taking? Authorizing Provider  acetaminophen (TYLENOL) 325 MG tablet Take 1 tablet (325 mg total) by mouth every 6 (six) hours as needed for mild pain (or Fever >/= 101). 01/11/18  Yes Gouru, Aruna, MD  aspirin 81 MG chewable tablet Chew 1 tablet (81 mg total) by mouth daily. 01/12/18  Yes Gouru, Aruna, MD  insulin glargine (LANTUS) 100 UNIT/ML injection Inject 0.22 mLs (22 Units total) into the skin daily. Patient taking differently: Inject 30 Units into the skin daily.  01/12/18  Yes Gouru, Illene Silver, MD  Multiple Vitamin (MULTIVITAMIN WITH MINERALS) TABS tablet Take 1 tablet by mouth daily. 01/12/18  Yes Gouru, Illene Silver, MD  ACCU-CHEK AVIVA PLUS test strip U ONCE TO BID UTD 09/17/17   [provider]  ACCU-CHEK SOFTCLIX LANCETS lancets TEST 1-2 XD 09/17/17   [provider]  amoxicillin-clavulanate (AUGMENTIN) 875-125 MG tablet Take 1 tablet by mouth every 12 (  twelve) hours. Patient not taking: Reported on 08/03/2018 01/11/18   Nicholes Mango, MD  apixaban (ELIQUIS) 5 MG TABS tablet Take 1 tablet (5 mg total) by mouth 2 (two) times daily. Patient not taking: Reported on 08/03/2018 01/11/18   Nicholes Mango, MD  atorvastatin (LIPITOR) 40 MG tablet Take 1 tablet (40 mg total) by mouth every evening. Patient not taking: Reported on 08/03/2018  01/11/18   Nicholes Mango, MD  Blood Glucose Monitoring Suppl (ACCU-CHEK AVIVA PLUS) w/Device KIT U TO TEST ONCE TO BID 09/17/17   [provider]  docusate sodium (COLACE) 100 MG capsule Take 1 capsule (100 mg total) by mouth 2 (two) times daily as needed for mild constipation. Patient not taking: Reported on 08/03/2018 01/11/18   Nicholes Mango, MD  GLOBAL EASE INJECT PEN NEEDLES 31G X 5 MM MISC Inject 1 Units into the skin daily. 09/11/17   Hillary Bow, MD  insulin aspart (NOVOLOG) 100 UNIT/ML injection Inject 0-15 Units into the skin 3 (three) times daily with meals. CBG < 70: implement hypoglycemia protocol CBG 70 - 120: 0 units CBG 121 - 150: 2 units CBG 151 - 200: 3 units CBG 201 - 250: 5 units CBG 251 - 300: 8 units CBG 301 - 350: 11 units CBG 351 - 400: 15 units CBG > 400: call MD and obtain STAT lab verification Patient taking differently: Inject 0-30 Units into the skin 3 (three) times daily with meals. CBG < 70: implement hypoglycemia protocol CBG 70 - 120: 0 units CBG 121 - 150: 2 units CBG 151 - 200: 3 units CBG 201 - 250: 5 units CBG 251 - 300: 8 units CBG 301 - 350: 11 units CBG 351 - 400: 15 units CBG > 400: call MD and obtain STAT lab verification 01/11/18   Gouru, Illene Silver, MD  metoprolol tartrate (LOPRESSOR) 25 MG tablet Take 1 tablet (25 mg total) by mouth 2 (two) times daily. Patient not taking: Reported on 08/03/2018 01/11/18   Nicholes Mango, MD  Needles & Syringes MISC Use as directed 01/31/16   Jola Schmidt, MD  oxyCODONE (OXY IR/ROXICODONE) 5 MG immediate release tablet Take 1 tablet (5 mg total) by mouth every 4 (four) hours as needed for moderate pain. Patient not taking: Reported on 08/03/2018 01/11/18   Nicholes Mango, MD  protein supplement shake (PREMIER PROTEIN) LIQD Take 325 mLs (11 oz total) by mouth 2 (two) times daily between meals. 01/12/18   Gouru, Illene Silver, MD  VOLTAREN 1 % GEL Apply 2 g topically 4 (four) times daily.  11/11/17   [provider]      Allergies Patient has no known allergies.  Family History  Problem Relation Age of Onset  . CAD Mother   . Diabetes Mellitus II Father     Social History Social History   Tobacco Use  . Smoking status: Former Smoker    Packs/day: 0.50  . Smokeless tobacco: Never Used  Substance Use Topics  . Alcohol use: No    Alcohol/week: 0.0 standard drinks  . 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 ENT: No sore throat. Cardiovascular: Denies chest pain. Respiratory: Denies shortness of breath. Gastrointestinal: Positive for abdominal pain.  No nausea, no vomiting.  No diarrhea.  No constipation. Genitourinary: Negative for dysuria. Musculoskeletal: Positive for back pain. Skin: Negative for rash. Neurological: Positive for focal numbness and weakness  ____________________________________________   PHYSICAL EXAM:  VITAL SIGNS: ED Triage Vitals  Enc Vitals Group     BP      Pulse      Resp      Temp      Temp src      SpO2      Weight      Height      Head Circumference      Peak Flow      Pain Score      Pain Loc      Pain Edu?      Excl. in [place]?     Constitutional: Alert and oriented x4 appears extremely uncomfortable wincing and crying in pain with even the slightest movement of the bed Eyes: PERRL EOMI. Head: Atraumatic. Nose: No congestion/rhinnorhea. Mouth/Throat: No trismus Neck: No stridor.   Cardiovascular: Normal rate, regular rhythm. Grossly normal heart sounds.  Good peripheral circulation. Respiratory: Normal respiratory effort.  No retractions. Lungs CTAB and moving good air Gastrointestinal: Soft nontender no peritonitis Decreased rectal tone and abnormal rectal sensation Musculoskeletal: Previous forefoot amputation of the right foot.  2+ dorsalis pedis pulses bilaterally Neurologic:   Decreased sensation below the umbilicus  roughly T46 level.  3 out of 5 hip flexion hip extension plantar flexion dorsiflexion bilaterally.  No ankle clonus but decreased DTRs Skin:  Skin is warm, dry and intact. No rash noted. Psychiatric: Anxious appearing   ____________________________________________   DIFFERENTIAL includes but not limited to  Aortic dissection, cauda equina syndrome, cord compression, mesenteric ischemia, sepsis __ MRI of the thoracic and lumbar spines reviewed by me with no acute cord compression __________________________________________   LABS (all labs ordered are listed, but only abnormal results are displayed)  Labs Reviewed  HEPATIC FUNCTION PANEL - Abnormal; Notable for the following components:      Result Value   Alkaline Phosphatase 29 (*)    All other components within normal limits  CBC WITH DIFFERENTIAL/PLATELET - Abnormal; Notable for the following components:   RBC 3.87 (*)    Hemoglobin 11.3 (*)    HCT 34.2 (*)    All other components within normal limits  LACTIC ACID, PLASMA - Abnormal; Notable for the following components:   Lactic Acid, Venous 3.1 (*)    All other components within normal limits  URINALYSIS, COMPLETE (UACMP) WITH MICROSCOPIC - Abnormal; Notable for the following components:   Color, Urine COLORLESS (*)    APPearance CLEAR (*)    Glucose, UA >=500 (*)    All other components within normal limits  BLOOD GAS, VENOUS - Abnormal; Notable for the following components:   pH, Ven 7.53 (*)    pCO2, Ven 32 (*)    Acid-Base Excess 4.4 (*)    All other components within normal limits  BASIC METABOLIC PANEL - Abnormal; Notable for the following components:   Sodium 131 (*)    Chloride 95 (*)    Glucose, Bld 560 (*)    All other components within normal limits  CULTURE, BLOOD (ROUTINE X 2)  CULTURE, BLOOD (ROUTINE X 2)  LIPASE, BLOOD  TROPONIN I  PROTIME-INR  LACTIC ACID, PLASMA    Lab work reviewed by me with a number of abnormalities most notably elevated  lactic acid which is nonspecific.  His alkalemia is likely secondary to hyperventilation and blowing off CO2 __________________________________________  EKG  ED ECG REPORT I, Darel Hong, the attending physician, personally viewed and interpreted this ECG.  Date: 08/03/2018 EKG Time: 0358  Rate: 79 Rhythm: normal sinus rhythm QRS Axis: Leftward axis Intervals: normal ST/T Wave abnormalities: ST depression in 2 3 aVF with an inverted T wave in V5 and V6.  Questionable elevation in aVR Narrative Interpretation: The patient's EKG is consistent with a global process and not STEMI  ED ECG REPORT I, Darel Hong, the attending physician, personally viewed and interpreted this ECG.  Date: 08/03/2018 EKG Time: 0509 Rate: 78 Rhythm: normal sinus rhythm QRS Axis: Leftward axis Intervals: normal ST/T Wave abnormalities: Persistent ST depression 2 3 aVF although V5 and V6 normalized Narrative Interpretation: Concerning for global process   ____________________________________________  RADIOLOGY  CT dissection protocol reviewed by me with no acute disease ____________________________________________   PROCEDURES  Procedure(s) performed: yes  Angiocath insertion Performed by: Darel Hong  Consent: Verbal consent obtained. Risks and benefits: risks, benefits and alternatives were discussed Time out: Immediately prior to procedure a "time out" was called to verify the correct patient, procedure, equipment, support staff and site/side marked as required.  Preparation: Patient was prepped and draped in the usual sterile fashion.  Vein Location: left AC  Ultrasound Guided  Gauge: 18  Normal blood return and flush without difficulty Patient tolerance: Patient tolerated the procedure well with no immediate complications.     .Critical Care Performed by: Darel Hong, MD Authorized by: Darel Hong, MD   Critical care provider statement:    Critical care  time (minutes):  45   Critical care time was exclusive of:  Separately billable procedures and treating other patients   Critical care was necessary to treat or prevent imminent or life-threatening deterioration of the following conditions:  CNS failure or compromise   Critical care was time spent personally by me on the following activities:  Development of treatment plan with patient or surrogate, discussions with consultants, evaluation of patient's response to treatment, examination of patient, obtaining history from patient or surrogate, ordering and performing treatments and interventions, ordering and review of laboratory studies, ordering and review of radiographic studies, pulse oximetry, re-evaluation of patient's condition and review of old charts    Critical Care performed: Yes  ____________________________________________   INITIAL IMPRESSION / ASSESSMENT AND PLAN / ED COURSE  Pertinent labs & imaging results that were available during my care of the patient were reviewed by me and considered in my medical decision making (see chart for details).   As part of my medical decision making, I reviewed the following data within the [place] History obtained from family if available, nursing notes, old chart and ekg, as well as notes from prior ED visits.  The patient arrives uncomfortable appearing and appears to have pain out of proportion to his exam.  He has inferior ST depression with no reciprocal elevation.  He does seem to have decreased sensation in bilateral lower extremities along with obvious fecal incontinence and decreased rectal tone.  Differential is broad but most concerning would be aortic catastrophe versus cauda equina syndrome.  I am sending him straight to the CT right now and MRI has been called in to come see the patient.  8 mg of IV morphine now for pain control along with 2.5 mg of Haldol for pain and nausea.      ----------------------------------------- 5:44 AM on 08/03/2018 -----------------------------------------  Fortunately the patient's CT angiogram is negative.  His pain is improved after the morphine.  He urinated completely and then we performed a bladder scan which showed greater than 487 cc left remaining in bladder which  just further raises my concern for cord compression.  At this time he is up an MRI having his T and LS spine imaged.  I appreciate his elevated lactic acid however the patient is afebrile and does not appear septic right now so I will actually defer antibiotics as if he has chronic osteomyelitis versus discitis antibiotics would get in the way of accurate cultures. ____________________________________________ ----------------------------------------- 8:03 AM on 08/03/2018 -----------------------------------------  Fortunately the patient's MRI is negative for acute cord compression.  He persists with significant pain and I do not have a clear reason for his lactic acidosis nor his neuro findings.  Now that his pain is better controlled he does have 4+ out of 5 strength bilateral lower extremities but still does have decreased sensation.  At this point I will check blood cultures and cover him with ceftriaxone and he will require inpatient admission for continued IV pain control, IV antibiotics, and management of his multiple medical issues.  I spoke with the patient who agrees with the plan.  I then discussed with the hospitalist Dr. Earleen Newport who has graciously agreed to admit the patient to his service.  FINAL CLINICAL IMPRESSION(S) / ED DIAGNOSES  Final diagnoses:  Generalized abdominal pain  Acute bilateral low back pain without sciatica  Lactic acidosis  Elevated troponin  Incontinence of feces, unspecified fecal incontinence type      NEW MEDICATIONS STARTED DURING THIS VISIT:  New Prescriptions   No medications on file     Note:  This document was prepared  using Dragon voice recognition software and may include unintentional dictation errors.     Darel Hong, MD 08/03/18 [phone]

## 2018-08-03 NOTE — Care Management Obs Status (Signed)
MEDICARE OBSERVATION STATUS NOTIFICATION   Patient Details  Name: Richard [name] MRN: [id number] Date of Birth: 1956-07-16   Medicare Observation Status Notification Given:  Yes    Maeley Matton A Tobenna Needs, RN 08/03/2018, 4:10 PM

## 2018-08-04 DIAGNOSIS — E872 Acidosis: Secondary | ICD-10-CM | POA: Diagnosis not present

## 2018-08-04 LAB — GLUCOSE, CAPILLARY
GLUCOSE-CAPILLARY: 98 mg/dL (ref 70–99)
Glucose-Capillary: 93 mg/dL (ref 70–99)

## 2018-08-04 MED ORDER — AMITRIPTYLINE HCL 50 MG PO TABS: 50.0000 mg | ORAL_TABLET | Freq: Every day | ORAL | 0 refills | Status: DC

## 2018-08-04 MED ORDER — INSULIN ASPART 100 UNIT/ML ~~LOC~~ SOLN: 8.0000 [IU] | Freq: Three times a day (TID) | SUBCUTANEOUS | 11 refills | Status: DC

## 2018-08-04 MED ORDER — GABAPENTIN 100 MG PO CAPS: 100.0000 mg | ORAL_CAPSULE | Freq: Three times a day (TID) | ORAL | 3 refills | Status: DC

## 2018-08-04 MED ORDER — CYCLOBENZAPRINE HCL 5 MG PO TABS: 5.0000 mg | ORAL_TABLET | Freq: Three times a day (TID) | ORAL | 0 refills | Status: DC | PRN

## 2018-08-04 MED ORDER — INSULIN GLARGINE 100 UNIT/ML ~~LOC~~ SOLN: 22.0000 [IU] | Freq: Every day | SUBCUTANEOUS | 3 refills | Status: DC

## 2018-08-04 MED ORDER — INSULIN ASPART 100 UNIT/ML ~~LOC~~ SOLN: 8.0000 [IU] | Freq: Three times a day (TID) | SUBCUTANEOUS | 3 refills | Status: DC

## 2018-08-04 MED ORDER — LIVING WELL WITH DIABETES BOOK
Freq: Once | Status: DC
  Filled 2018-08-04 (×2): qty 1

## 2018-08-04 MED ORDER — TRAZODONE HCL 50 MG PO TABS: 50.0000 mg | ORAL_TABLET | Freq: Every evening | ORAL | 0 refills | Status: DC | PRN

## 2018-08-04 MED ORDER — INSULIN GLARGINE 100 UNIT/ML SOLOSTAR PEN: 22.0000 [IU] | PEN_INJECTOR | Freq: Every day | SUBCUTANEOUS | 11 refills | Status: DC

## 2018-08-04 NOTE — Plan of Care (Signed)
  Problem: Education: Goal: Knowledge of General Education information will improve Description Including pain rating scale, medication(s)/side effects and non-pharmacologic comfort measures Outcome: Progressing   Problem: Clinical Measurements: Goal: Ability to maintain clinical measurements within normal limits will improve Outcome: Progressing   Problem: Pain Managment: Goal: General experience of comfort will improve Outcome: Progressing   Problem: Safety: Goal: Ability to remain free from injury will improve Outcome: Progressing   Problem: Education: Goal: Ability to describe self-care measures that may prevent or decrease complications (Diabetes Survival Skills Education) will improve Outcome: Progressing Goal: Individualized Educational Video(s) Outcome: Progressing

## 2018-08-04 NOTE — Progress Notes (Signed)
Ambulated around nurses station  With standby assist tol well.

## 2018-08-04 NOTE — Progress Notes (Addendum)
Inpatient Diabetes Program Recommendations  AACE/ADA: New Consensus Statement on Inpatient Glycemic Control (2019)  Target Ranges:  Prepandial:   less than 140 mg/dL      Peak postprandial:   less than 180 mg/dL (1-2 hours)      Critically ill patients:  140 - 180 mg/dL   Results for Richard [name], Richard [name] (MRN [id number]) as of 08/04/2018 08:02  Ref. Range 08/03/2018 11:55 08/03/2018 17:04 08/03/2018 21:04 08/04/2018 07:42  Glucose-Capillary Latest Ref Range: 70 - 99 mg/dL 322 (H) 161 (H) 117 (H) 93   Results for Richard [name], Richard [name] (MRN [id number]) as of 08/04/2018 08:02  Ref. Range 01/09/2018 08:28 08/03/2018 11:19  Hemoglobin A1C Latest Ref Range: 4.8 - 5.6 % 17.0 (H) 13.6 (H)   Review of Glycemic Control  Diabetes history: DM2 Outpatient Diabetes medications: Lantus 30 units QHS, Novolog 0-30 units TID with meals Current orders for Inpatient glycemic control: Lantus 30 units QHS, Novolog 0-20 units TID with meals, Novolog 0-5 units QHS, Novolog 5 units TID with meals  Inpatient Diabetes Program Recommendations:  A1C: A1C 13.6% on 08/03/18 indicating an average glucose of 344 mg/dl over the past 2-3 months.   Addendum 08/04/18@11 :00: Spoke with patient about diabetes and home regimen for diabetes control. Patient reports that he is followed by [facility] for diabetes management and currently he takes Lantus 30 units QHS and Novolog 0-30 units TID with meals as an outpatient for diabetes control. Patient reports that he usually takes around Novolog 20 units with meals. Patient reports that he is taking insulin as prescribed and notes that he missed his last appointment at the clinic last week.  Patient states that he checks his glucose 3 times per day and that it is usually in the 200's mg/dl.  Discussed A1C results (13.6% on 08/03/18) and explained that his current A1C indicates an average glucose of 344 mg/dl over the past 2-3 months. Discussed glucose and A1C goals. Discussed importance of  checking CBGs and maintaining good CBG control to prevent long-term and short-term complications. Explained how hyperglycemia leads to damage within blood vessels which lead to the common complications seen with uncontrolled diabetes. Stressed to the patient the importance of improving glycemic control to prevent further complications from uncontrolled diabetes. Discussed impact of nutrition, exercise, stress, sickness, and medications on diabetes control. Patient states that he drinks sugary beverages but he states he plans to start drinking more water and cutting down on sugary beverages. Discussed carbohydrates, carbohydrate goals per day and meal, along with portion sizes. Encouraged patient to eliminate sugary beverages from diet and moderate carbohydrate intake.  Encouraged patient to check his glucose 4 times per day (before meals and at bedtime) and to keep a log book of glucose readings and insulin taken which he will need to take to doctor appointments. Asked patient to make a follow up appointment at [name] since he missed his last appointment. Patient states he is almost out of Lantus and Novolog (uses insulin pens) and states he will need prescriptions for both insulins at time of discharge. Patient verbalized understanding of information discussed and he states that he has no further questions at this time related to diabetes.  At time of discharge please provide Rx for: Lantus SoloStar pen [phone]) and Novolog Flex Pen [phone]).  Thanks, Barnie Alderman, RN, MSN, CDE Diabetes Coordinator Inpatient Diabetes Program [phone] (Team Pager from 8am to 5pm)

## 2018-08-04 NOTE — Discharge Summary (Signed)
[facility] [place] NAME: Richard [name]    MR#:  [id number]  DATE OF BIRTH:  Jul 21, 1956  DATE OF ADMISSION:  08/03/2018 ADMITTING PHYSICIAN: Gorden Harms, MD  DATE OF DISCHARGE: No discharge date for patient encounter.  PRIMARY CARE PHYSICIAN: [facility], [facility]    ADMISSION DIAGNOSIS:  Lactic acidosis [E87.2] Generalized abdominal pain [R10.84] Elevated troponin [R79.89] Acute bilateral low back pain without sciatica [M54.5] Incontinence of feces, unspecified fecal incontinence type [R15.9]  DISCHARGE DIAGNOSIS:  Active Problems:   Hyperglycemia   SECONDARY DIAGNOSIS:   Past Medical History:  Diagnosis Date  . Atrial fibrillation ([place])    per patient  . Cancer ([place])   . CHF (congestive heart failure) ([place])   . Chronic back pain   . Chronic leg pain   . Coronary artery disease   . Diabetes mellitus without complication ([place])   . Hypertension   . Neuropathy     HOSPITAL COURSE:  *Acute hyperglycemia secondary to uncontrolled diabetes myelitis type II Resolved Referred to the observation unit, provided IV fluids for rehydration, diabetic educator consulted, insulin with meals, Lantus at bedtime, sliding scale insulin with Accu-Cheks per routine, hemoglobin A1c consistent with poorly controlled diabetes myelitis-13.6, provided carbohydrate consistent diet  *Acute accelerated hypertension Resolved Due to medication noncompliance Resumed Lopressor, hydralazine PRN, vitals per routine, make changes as per necessary  *Acute pseudohyponatremia with hypochloremia Resolved with IV fluids for rehydration  *Acute on chronic pain syndrome Controlled on current regiment Suspect due to opioid drug-seeking behavior Limited opioids as much as possible while in house, treated with non-habit-forming medications in the form of Neurontin, Elavil, Flexeril, Tylenol for pain, refer to pain management status  post discharge for continued medical management  *Acute lactic acidosis Most likely secondary to hyperglycemia Resolved with IV fluids rehydration   DISCHARGE CONDITIONS:   stable  CONSULTS OBTAINED:    DRUG ALLERGIES:  No Known Allergies  DISCHARGE MEDICATIONS:   Allergies as of 08/04/2018   No Known Allergies     Medication List    TAKE these medications   ACCU-CHEK AVIVA PLUS test strip Generic drug:  glucose blood U ONCE TO BID UTD   ACCU-CHEK AVIVA PLUS w/Device Kit U TO TEST ONCE TO BID   ACCU-CHEK SOFTCLIX LANCETS lancets TEST 1-2 XD   acetaminophen 325 MG tablet Commonly known as:  TYLENOL Take 1 tablet (325 mg total) by mouth every 6 (six) hours as needed for mild pain (or Fever >/= 101).   amitriptyline 50 MG tablet Commonly known as:  ELAVIL Take 1 tablet (50 mg total) by mouth at bedtime.   apixaban 5 MG Tabs tablet Commonly known as:  ELIQUIS Take 1 tablet (5 mg total) by mouth 2 (two) times daily.   aspirin 81 MG chewable tablet Chew 1 tablet (81 mg total) by mouth daily.   atorvastatin 40 MG tablet Commonly known as:  LIPITOR Take 1 tablet (40 mg total) by mouth every evening.   cyclobenzaprine 5 MG tablet Commonly known as:  FLEXERIL Take 1 tablet (5 mg total) by mouth 3 (three) times daily as needed for muscle spasms.   gabapentin 100 MG capsule Commonly known as:  NEURONTIN Take 1 capsule (100 mg total) by mouth 3 (three) times daily.   GLOBAL EASE INJECT PEN NEEDLES 31G X 5 MM Misc Generic drug:  Insulin Pen Needle Inject 1 Units into the skin daily.   insulin aspart 100 UNIT/ML injection  Commonly known as:  novoLOG Inject 0-15 Units into the skin 3 (three) times daily with meals. CBG < 70: implement hypoglycemia protocol CBG 70 - 120: 0 units CBG 121 - 150: 2 units CBG 151 - 200: 3 units CBG 201 - 250: 5 units CBG 251 - 300: 8 units CBG 301 - 350: 11 units CBG 351 - 400: 15 units CBG > 400: call MD and obtain STAT lab  verification What changed:  how much to take   insulin glargine 100 UNIT/ML injection Commonly known as:  LANTUS Inject 0.22 mLs (22 Units total) into the skin daily. What changed:  how much to take   metoprolol tartrate 25 MG tablet Commonly known as:  LOPRESSOR Take 1 tablet (25 mg total) by mouth 2 (two) times daily.   multivitamin with minerals Tabs tablet Take 1 tablet by mouth daily.   Needles & Syringes Misc Use as directed   protein supplement shake Liqd Commonly known as:  PREMIER PROTEIN Take 325 mLs (11 oz total) by mouth 2 (two) times daily between meals.   traZODone 50 MG tablet Commonly known as:  DESYREL Take 1 tablet (50 mg total) by mouth at bedtime as needed for sleep.   VOLTAREN 1 % Gel Generic drug:  diclofenac sodium Apply 2 g topically 4 (four) times daily.        DISCHARGE INSTRUCTIONS:   If you experience worsening of your admission symptoms, develop shortness of breath, life threatening emergency, suicidal or homicidal thoughts you must seek medical attention immediately by calling 911 or calling your MD immediately  if symptoms less severe.  You Must read complete instructions/literature along with all the possible adverse reactions/side effects for all the Medicines you take and that have been prescribed to you. Take any new Medicines after you have completely understood and accept all the possible adverse reactions/side effects.   Please note  You were cared for by a hospitalist during your hospital stay. If you have any questions about your discharge medications or the care you received while you were in the hospital after you are discharged, you can call the unit and asked to speak with the hospitalist on call if the hospitalist that took care of you is not available. Once you are discharged, your primary care physician will handle any further medical issues. Please note that NO REFILLS for any discharge medications will be authorized once you  are discharged, as it is imperative that you return to your primary care physician (or establish a relationship with a primary care physician if you do not have one) for your aftercare needs so that they can reassess your need for medications and monitor your lab values.    Today   CHIEF COMPLAINT:   Chief Complaint  Patient presents with  . Abdominal Pain    HISTORY OF PRESENT ILLNESS:  62 y.o. male with a known history per below presents to the emergency room via EMS with numerous complaints which includes acute on chronic worsening diffuse body pain-abdominal pain/back pain/bilateral leg/feet pain, toothache, loose teeth, bilateral feet numbness, inability to urinate per records-patient denies this/able to urinate but pain with urination, fecal incontinence per records-patient denies this, rectal bleeding, nausea without emesis, chills, admits to being noncompliant with medications, states that he does take his insulin as recommended, patient states that this diffuse body pain all over is made better with pain medicine, patient states that he has been unable to go to a pain specialist as yet, states that  he is only able to get a few pain pills at a time from local doctors, in the emergency room extensive work-up was done which was largely unimpressive, glucose was 560, sodium 131, chloride 95, blood pressure 182/89, patient is now been admitted for acute hyperglycemia due to uncontrolled diabetes mellitus type 2, acute on chronic pain syndrome-suspected opioid drug-seeking behavior, and noncompliance with medical management. VITAL SIGNS:  Blood pressure 130/83, pulse 70, temperature 98 F (36.7 C), temperature source Oral, resp. rate 18, height 5' 11"  (1.803 m), weight 68.2 kg, SpO2 98 %.  I/O:    Intake/Output Summary (Last 24 hours) at 08/04/2018 0922 Last data filed at 08/04/2018 0610 Gross per 24 hour  Intake 3736.72 ml  Output 600 ml  Net 3136.72 ml    PHYSICAL EXAMINATION:   GENERAL:  62 y.o.-year-old patient lying in the bed with no acute distress.  EYES: Pupils equal, round, reactive to light and accommodation. No scleral icterus. Extraocular muscles intact.  HEENT: Head atraumatic, normocephalic. Oropharynx and nasopharynx clear.  NECK:  Supple, no jugular venous distention. No thyroid enlargement, no tenderness.  LUNGS: Normal breath sounds bilaterally, no wheezing, rales,rhonchi or crepitation. No use of accessory muscles of respiration.  CARDIOVASCULAR: S1, S2 normal. No murmurs, rubs, or gallops.  ABDOMEN: Soft, non-tender, non-distended. Bowel sounds present. No organomegaly or mass.  EXTREMITIES: No pedal edema, cyanosis, or clubbing.  NEUROLOGIC: Cranial nerves II through XII are intact. Muscle strength 5/5 in all extremities. Sensation intact. Gait not checked.  PSYCHIATRIC: The patient is alert and oriented x 3.  SKIN: No obvious rash, lesion, or ulcer.   DATA REVIEW:   CBC Recent Labs  Lab 08/03/18 0407  WBC 7.2  HGB 11.3*  HCT 34.2*  PLT 326    Chemistries  Recent Labs  Lab 08/03/18 0407  NA 131*  K 4.4  CL 95*  CO2 24  GLUCOSE 560*  BUN 14  CREATININE 1.05  CALCIUM 9.0  AST 22  ALT 14  ALKPHOS 29*  BILITOT 0.5    Cardiac Enzymes Recent Labs  Lab 08/03/18 0407  TROPONINI <0.03    Microbiology Results  Results for orders placed or performed during the hospital encounter of 08/03/18  Blood Culture (routine x 2)     Status: None (Preliminary result)   Collection Time: 08/03/18  7:47 AM  Result Value Ref Range Status   Specimen Description BLOOD L AC  Final   Special Requests   Final    BOTTLES DRAWN AEROBIC AND ANAEROBIC Blood Culture results may not be optimal due to an excessive volume of blood received in culture bottles   Culture   Final    NO GROWTH < 24 HOURS Performed at [facility], [address] [place], [place] [address]    Report Status PENDING  Incomplete  Blood Culture (routine x 2)      Status: None (Preliminary result)   Collection Time: 08/03/18  7:47 AM  Result Value Ref Range Status   Specimen Description BLOOD RFA  Final   Special Requests   Final    BOTTLES DRAWN AEROBIC AND ANAEROBIC Blood Culture results may not be optimal due to an excessive volume of blood received in culture bottles   Culture   Final    NO GROWTH < 24 HOURS Performed at [facility], [address] [place],  [address]    Report Status PENDING  Incomplete    RADIOLOGY:  Mr Thoracic Spine W Wo Contrast  Result  Date: 08/03/2018 CLINICAL DATA:  Back pain.  Possible cauda equina syndrome. EXAM: MRI THORACIC AND LUMBAR SPINE WITHOUT AND WITH CONTRAST TECHNIQUE: Multiplanar and multiecho pulse sequences of the thoracic and lumbar spine were obtained without and with intravenous contrast. CONTRAST:  6 mL Gadavist COMPARISON:  None. FINDINGS: MRI THORACIC SPINE FINDINGS Alignment:  Physiologic. Vertebrae: No fracture, evidence of discitis, or bone lesion. Cord:  Normal signal and morphology. Paraspinal and other soft tissues: Negative. Disc levels: There are small disc protrusions at T3-4, T5-6, T6-7, T7-8, T8-9 without spinal canal stenosis. The T8-9 protrusion contacts the ventral spinal cord. No neural foraminal stenosis. At T4-5, there is a small osteophyte at the dorsal spinal canal narrows the dorsal thecal sac. No abnormal contrast enhancement. MRI LUMBAR SPINE FINDINGS Segmentation:  Standard. Alignment:  Physiologic. Vertebrae: There is degenerative endplate signal change at L5-S1 with a large inferior endplate Schmorl's node. No acute fracture. No discitis-osteomyelitis. Conus medullaris: Extends to the T12-L1 level and appears normal. Paraspinal and other soft tissues: Negative. Disc levels: The T12-L4 disc levels are normal. L4-L5: Small central disc protrusion with narrowing of the left lateral recess. No central spinal canal or neural foraminal stenosis. L5-S1: Small disc  osteophyte complex. No central spinal canal stenosis. Mild bilateral neural foraminal stenosis. No abnormal contrast enhancement. IMPRESSION: 1. No spinal cord or cauda equina compression. 2. Mild multilevel thoracic degenerative changes with multiple small disc protrusions that do not cause spinal canal stenosis. The T8-9 disc contacts the ventral spinal cord. 3. L5-S1 disc space narrowing and endplate spurring with mild bilateral neural foraminal stenosis. Electronically Signed   By: Ulyses Jarred M.D.   On: 08/03/2018 07:02   Mr Lumbar Spine W Wo Contrast  Result Date: 08/03/2018 CLINICAL DATA:  Back pain.  Possible cauda equina syndrome. EXAM: MRI THORACIC AND LUMBAR SPINE WITHOUT AND WITH CONTRAST TECHNIQUE: Multiplanar and multiecho pulse sequences of the thoracic and lumbar spine were obtained without and with intravenous contrast. CONTRAST:  6 mL Gadavist COMPARISON:  None. FINDINGS: MRI THORACIC SPINE FINDINGS Alignment:  Physiologic. Vertebrae: No fracture, evidence of discitis, or bone lesion. Cord:  Normal signal and morphology. Paraspinal and other soft tissues: Negative. Disc levels: There are small disc protrusions at T3-4, T5-6, T6-7, T7-8, T8-9 without spinal canal stenosis. The T8-9 protrusion contacts the ventral spinal cord. No neural foraminal stenosis. At T4-5, there is a small osteophyte at the dorsal spinal canal narrows the dorsal thecal sac. No abnormal contrast enhancement. MRI LUMBAR SPINE FINDINGS Segmentation:  Standard. Alignment:  Physiologic. Vertebrae: There is degenerative endplate signal change at L5-S1 with a large inferior endplate Schmorl's node. No acute fracture. No discitis-osteomyelitis. Conus medullaris: Extends to the T12-L1 level and appears normal. Paraspinal and other soft tissues: Negative. Disc levels: The T12-L4 disc levels are normal. L4-L5: Small central disc protrusion with narrowing of the left lateral recess. No central spinal canal or neural foraminal  stenosis. L5-S1: Small disc osteophyte complex. No central spinal canal stenosis. Mild bilateral neural foraminal stenosis. No abnormal contrast enhancement. IMPRESSION: 1. No spinal cord or cauda equina compression. 2. Mild multilevel thoracic degenerative changes with multiple small disc protrusions that do not cause spinal canal stenosis. The T8-9 disc contacts the ventral spinal cord. 3. L5-S1 disc space narrowing and endplate spurring with mild bilateral neural foraminal stenosis. Electronically Signed   By: Ulyses Jarred M.D.   On: 08/03/2018 07:02   Ct Angio Chest/abd/pel For Dissection W And/or W/wo  Result Date: 08/03/2018 CLINICAL DATA:  Abdominal  pain radiating to both legs.  Chest pain. EXAM: CT ANGIOGRAPHY CHEST, ABDOMEN AND PELVIS TECHNIQUE: Multidetector CT imaging through the chest, abdomen and pelvis was performed using the standard protocol during bolus administration of intravenous contrast. Multiplanar reconstructed images and MIPs were obtained and reviewed to evaluate the vascular anatomy. CONTRAST:  170m ISOVUE-370 IOPAMIDOL (ISOVUE-370) INJECTION 76% COMPARISON:  CT abdomen pelvis 09/05/2017 FINDINGS: CTA CHEST FINDINGS Cardiovascular: --Heart: The heart size is normal. There is nopericardial effusion. There is coronary artery calcification. --Aorta: The course and caliber of the thoracic aorta are normal. There is mild aortic atherosclerotic calcification. Precontrast images show no aortic intramural hematoma. There is no blood pool, dissection or penetrating ulcer demonstrated on arterial phase postcontrast imaging. There is a conventional 3 vessel aortic arch branching pattern. The proximal arch vessels are widely patent. --Pulmonary Arteries: Contrast timing is optimized for preferential opacification of the aorta. Within that limitation, normal central pulmonary arteries. Mediastinum/Nodes: No mediastinal, hilar or axillary lymphadenopathy. The visualized thyroid and thoracic  esophageal course are unremarkable. Lungs/Pleura: No pulmonary nodules or masses. No pleural effusion or pneumothorax. No focal airspace consolidation. No focal pleural abnormality. Musculoskeletal: No chest wall abnormality. No acute osseous findings. Review of the MIP images confirms the above findings. CTA ABDOMEN AND PELVIS FINDINGS VASCULAR Aorta: Normal caliber aorta without aneurysm, dissection, vasculitis or hemodynamically significant stenosis. There is mild mixed density aortic atherosclerosis. No hemodynamically significant stenosis. Celiac: No aneurysm, dissection or hemodynamically significant stenosis. Normal branching pattern. SMA: There is mild calcification near the origin.  Patent distally. Renals: Single renal arteries bilaterally. No aneurysm, dissection, stenosis or evidence of fibromuscular dysplasia. IMA: Patent without abnormality. Inflow: No aneurysm, stenosis or dissection. Veins: Normal course and caliber of the major veins. Assessment is otherwise limited by the arterial dominant contrast phase. Review of the MIP images confirms the above findings. NON-VASCULAR Hepatobiliary: Normal hepatic contours and density. No visible biliary dilatation. Normal gallbladder. Pancreas: The distal pancreas has been resected. Spleen: The spleen is surgically absent. Adrenals/Urinary Tract: --Adrenal glands: Normal. --Right kidney/ureter: No hydronephrosis or perinephric stranding. No nephrolithiasis. No obstructing ureteral stones. --Left kidney/ureter: No hydronephrosis or perinephric stranding. No nephrolithiasis. No obstructing ureteral stones. --Urinary bladder: Unremarkable. Stomach/Bowel: --Stomach/Duodenum: No hiatal hernia or other gastric abnormality. Normal duodenal course and caliber. --Small bowel: No dilatation or inflammation. --Colon: No focal abnormality. --Appendix: Normal. Lymphatic:  No abdominal or pelvic lymphadenopathy. Reproductive: Normal prostate and seminal vesicles.  Musculoskeletal. Degenerative changes greatest at the L5-S1 disc level. Other: None. Review of the MIP images confirms the above findings. . IMPRESSION: 1. No acute aortic syndrome. 2. Coronary artery and aortic atherosclerosis (ICD10-I70.0). 3. Remote distal pancreatectomy and splenectomy. Electronically Signed   By: KUlyses JarredM.D.   On: 08/03/2018 05:08    EKG:   Orders placed or performed during the hospital encounter of 08/03/18  . ED EKG  . ED EKG  . EKG 12-Lead  . EKG 12-Lead  . EKG 12-Lead  . EKG 12-Lead  . EKG 12-Lead  . EKG 12-Lead      Management plans discussed with the patient, family and they are in agreement.  CODE STATUS:     Code Status Orders  (From admission, onward)         Start     Ordered   08/03/18 1021  Full code  Continuous     08/03/18 1020        Code Status History    Date Active Date Inactive Code Status Order ID  Comments User Context   01/09/2018 1025 01/11/2018 1932 Full Code [id number]  Harrie Foreman, MD Inpatient   09/05/2017 1559 09/11/2017 1928 Full Code [id number]  Fritzi Mandes, MD Inpatient   08/24/2017 0238 08/29/2017 2131 Full Code [id number]  Lance Coon, MD Inpatient   08/10/2015 0822 08/11/2015 1947 Full Code [id number]  Harrie Foreman, MD Inpatient      TOTAL TIME TAKING CARE OF THIS PATIENT: 40 minutes.    Avel Peace Destany Severns M.D on 08/04/2018 at 9:22 AM  Between 7am to 6pm - Pager - [phone]  After 6pm go to [email or web address] - password [email or web address] [facility]  Office  [phone]  CC: Primary care physician; [facility] [place]   Note: This dictation was prepared with Diplomatic Services operational officer dictation along with smaller phrase technology. Any transcriptional errors that result from this process are unintentional.

## 2018-08-08 LAB — CULTURE, BLOOD (ROUTINE X 2)
CULTURE: NO GROWTH
Culture: NO GROWTH

## 2018-08-12 LAB — BLOOD GAS, VENOUS
Acid-Base Excess: 4.4 mmol/L — ABNORMAL HIGH (ref 0.0–2.0)
BICARBONATE: 26.7 mmol/L (ref 20.0–28.0)
Patient temperature: 37
pCO2, Ven: 32 mmHg — ABNORMAL LOW (ref 44.0–60.0)
pH, Ven: 7.53 — ABNORMAL HIGH (ref 7.250–7.430)

## 2018-09-20 ENCOUNTER — Emergency Department: Payer: Medicare Other

## 2018-09-20 ENCOUNTER — Inpatient Hospital Stay
Admission: EM | Admit: 2018-09-20 | Discharge: 2018-09-24 | DRG: 639 | Disposition: A | Discharge status: Home / Self Care | Payer: Medicare Other | Attending: Internal Medicine | Admitting: Internal Medicine

## 2018-09-20 ENCOUNTER — Other Ambulatory Visit: Payer: Self-pay

## 2018-09-20 DIAGNOSIS — E1165 Type 2 diabetes mellitus with hyperglycemia: Principal | ICD-10-CM | POA: Diagnosis present

## 2018-09-20 DIAGNOSIS — Z833 Family history of diabetes mellitus: Secondary | ICD-10-CM | POA: Diagnosis not present

## 2018-09-20 DIAGNOSIS — Z9114 Patient's other noncompliance with medication regimen: Secondary | ICD-10-CM | POA: Diagnosis not present

## 2018-09-20 DIAGNOSIS — Z7982 Long term (current) use of aspirin: Secondary | ICD-10-CM

## 2018-09-20 DIAGNOSIS — G894 Chronic pain syndrome: Secondary | ICD-10-CM | POA: Diagnosis present

## 2018-09-20 DIAGNOSIS — E1140 Type 2 diabetes mellitus with diabetic neuropathy, unspecified: Secondary | ICD-10-CM | POA: Diagnosis present

## 2018-09-20 DIAGNOSIS — E1110 Type 2 diabetes mellitus with ketoacidosis without coma: Secondary | ICD-10-CM

## 2018-09-20 DIAGNOSIS — E876 Hypokalemia: Secondary | ICD-10-CM | POA: Diagnosis present

## 2018-09-20 DIAGNOSIS — I509 Heart failure, unspecified: Secondary | ICD-10-CM | POA: Diagnosis present

## 2018-09-20 DIAGNOSIS — R4182 Altered mental status, unspecified: Secondary | ICD-10-CM

## 2018-09-20 DIAGNOSIS — Z79891 Long term (current) use of opiate analgesic: Secondary | ICD-10-CM

## 2018-09-20 DIAGNOSIS — E860 Dehydration: Secondary | ICD-10-CM

## 2018-09-20 DIAGNOSIS — I110 Hypertensive heart disease with heart failure: Secondary | ICD-10-CM | POA: Diagnosis present

## 2018-09-20 DIAGNOSIS — Z90410 Acquired total absence of pancreas: Secondary | ICD-10-CM | POA: Diagnosis not present

## 2018-09-20 DIAGNOSIS — Z8507 Personal history of malignant neoplasm of pancreas: Secondary | ICD-10-CM | POA: Diagnosis not present

## 2018-09-20 DIAGNOSIS — Z8249 Family history of ischemic heart disease and other diseases of the circulatory system: Secondary | ICD-10-CM | POA: Diagnosis not present

## 2018-09-20 DIAGNOSIS — Z79899 Other long term (current) drug therapy: Secondary | ICD-10-CM

## 2018-09-20 DIAGNOSIS — E11649 Type 2 diabetes mellitus with hypoglycemia without coma: Secondary | ICD-10-CM | POA: Diagnosis not present

## 2018-09-20 DIAGNOSIS — Z794 Long term (current) use of insulin: Secondary | ICD-10-CM

## 2018-09-20 DIAGNOSIS — Z955 Presence of coronary angioplasty implant and graft: Secondary | ICD-10-CM | POA: Diagnosis not present

## 2018-09-20 DIAGNOSIS — I4891 Unspecified atrial fibrillation: Secondary | ICD-10-CM | POA: Diagnosis present

## 2018-09-20 DIAGNOSIS — Z7901 Long term (current) use of anticoagulants: Secondary | ICD-10-CM | POA: Diagnosis not present

## 2018-09-20 DIAGNOSIS — I2510 Atherosclerotic heart disease of native coronary artery without angina pectoris: Secondary | ICD-10-CM | POA: Diagnosis present

## 2018-09-20 DIAGNOSIS — Z87891 Personal history of nicotine dependence: Secondary | ICD-10-CM

## 2018-09-20 DIAGNOSIS — R400 Somnolence: Secondary | ICD-10-CM

## 2018-09-20 DIAGNOSIS — E785 Hyperlipidemia, unspecified: Secondary | ICD-10-CM | POA: Diagnosis present

## 2018-09-20 DIAGNOSIS — R739 Hyperglycemia, unspecified: Secondary | ICD-10-CM

## 2018-09-20 LAB — CBC
HEMATOCRIT: 33.9 % — AB (ref 39.0–52.0)
Hemoglobin: 11.2 g/dL — ABNORMAL LOW (ref 13.0–17.0)
MCH: 29.6 pg (ref 26.0–34.0)
MCHC: 33 g/dL (ref 30.0–36.0)
MCV: 89.7 fL (ref 80.0–100.0)
Platelets: 321 10*3/uL (ref 150–400)
RBC: 3.78 MIL/uL — ABNORMAL LOW (ref 4.22–5.81)
RDW: 13.6 % (ref 11.5–15.5)
WBC: 7.7 10*3/uL (ref 4.0–10.5)
nRBC: 0 % (ref 0.0–0.2)

## 2018-09-20 LAB — COMPREHENSIVE METABOLIC PANEL
ALT: 16 U/L (ref 0–44)
ANION GAP: 11 (ref 5–15)
AST: 22 U/L (ref 15–41)
Albumin: 3.8 g/dL (ref 3.5–5.0)
Alkaline Phosphatase: 26 U/L — ABNORMAL LOW (ref 38–126)
BILIRUBIN TOTAL: 0.9 mg/dL (ref 0.3–1.2)
BUN: 6 mg/dL — AB (ref 8–23)
CO2: 28 mmol/L (ref 22–32)
Calcium: 9.1 mg/dL (ref 8.9–10.3)
Chloride: 99 mmol/L (ref 98–111)
Creatinine, Ser: 0.84 mg/dL (ref 0.61–1.24)
Glucose, Bld: 434 mg/dL — ABNORMAL HIGH (ref 70–99)
POTASSIUM: 3.3 mmol/L — AB (ref 3.5–5.1)
Sodium: 138 mmol/L (ref 135–145)
TOTAL PROTEIN: 6.9 g/dL (ref 6.5–8.1)

## 2018-09-20 LAB — POCT I-STAT, CHEM 8
BUN: <3 mg/dL — ABNORMAL LOW (ref 8–23)
CREATININE: 0.9 mg/dL (ref 0.61–1.24)
Calcium, Ion: 1.21 mmol/L (ref 1.15–1.40)
Chloride: 99 mmol/L (ref 98–111)
GLUCOSE: 421 mg/dL — AB (ref 70–99)
HCT: 34 % — ABNORMAL LOW (ref 39.0–52.0)
Hemoglobin: 11.6 g/dL — ABNORMAL LOW (ref 13.0–17.0)
Potassium: 3.3 mmol/L — ABNORMAL LOW (ref 3.5–5.1)
Sodium: 138 mmol/L (ref 135–145)
TCO2: 29 mmol/L (ref 22–32)

## 2018-09-20 LAB — LIPASE, BLOOD: LIPASE: 21 U/L (ref 11–51)

## 2018-09-20 LAB — AMMONIA: Ammonia: 13 umol/L (ref 9–35)

## 2018-09-20 LAB — PROTIME-INR
INR: 1.17
Prothrombin Time: 14.8 seconds (ref 11.4–15.2)

## 2018-09-20 LAB — GLUCOSE, CAPILLARY
GLUCOSE-CAPILLARY: 421 mg/dL — AB (ref 70–99)
GLUCOSE-CAPILLARY: 270 mg/dL — AB (ref 70–99)
Glucose-Capillary: 362 mg/dL — ABNORMAL HIGH (ref 70–99)

## 2018-09-20 LAB — TSH: TSH: 0.779 u[IU]/mL (ref 0.350–4.500)

## 2018-09-20 LAB — CG4 I-STAT (LACTIC ACID): Lactic Acid, Venous: 0.9 mmol/L (ref 0.5–1.9)

## 2018-09-20 MED ORDER — ONDANSETRON HCL 4 MG/2ML IJ SOLN: 4.0000 mg | Freq: Four times a day (QID) | INTRAMUSCULAR | Status: DC | PRN

## 2018-09-20 MED ORDER — ASPIRIN 81 MG PO CHEW
81.0000 mg | CHEWABLE_TABLET | Freq: Every day | ORAL | Status: DC
  Administered 2018-09-21 – 2018-09-24 (×4): 81 mg via ORAL
  Filled 2018-09-20 (×4): qty 1

## 2018-09-20 MED ORDER — ACETAMINOPHEN 325 MG PO TABS
650.0000 mg | ORAL_TABLET | Freq: Four times a day (QID) | ORAL | Status: DC | PRN
  Administered 2018-09-20 – 2018-09-21 (×2): 650 mg via ORAL
  Filled 2018-09-20 (×2): qty 2

## 2018-09-20 MED ORDER — INSULIN ASPART 100 UNIT/ML ~~LOC~~ SOLN
5.0000 [IU] | Freq: Once | SUBCUTANEOUS | Status: AC
  Administered 2018-09-20: 5 [IU] via INTRAVENOUS
  Filled 2018-09-20: qty 1

## 2018-09-20 MED ORDER — INSULIN ASPART 100 UNIT/ML ~~LOC~~ SOLN
0.0000 [IU] | Freq: Three times a day (TID) | SUBCUTANEOUS | Status: DC
  Administered 2018-09-21: 3 [IU] via SUBCUTANEOUS
  Administered 2018-09-21: 11 [IU] via SUBCUTANEOUS
  Administered 2018-09-22: 8 [IU] via SUBCUTANEOUS
  Administered 2018-09-22 – 2018-09-23 (×2): 15 [IU] via SUBCUTANEOUS
  Administered 2018-09-23 (×2): 3 [IU] via SUBCUTANEOUS
  Administered 2018-09-24: 11 [IU] via SUBCUTANEOUS
  Administered 2018-09-24: 2 [IU] via SUBCUTANEOUS
  Filled 2018-09-20 (×9): qty 1

## 2018-09-20 MED ORDER — POTASSIUM CHLORIDE 10 MEQ/100ML IV SOLN
10.0000 meq | Freq: Once | INTRAVENOUS | Status: AC
  Administered 2018-09-20: 10 meq via INTRAVENOUS
  Filled 2018-09-20: qty 100

## 2018-09-20 MED ORDER — METOPROLOL TARTRATE 5 MG/5ML IV SOLN
2.5000 mg | Freq: Once | INTRAVENOUS | Status: AC
  Administered 2018-09-20: 2.5 mg via INTRAVENOUS
  Filled 2018-09-20: qty 5

## 2018-09-20 MED ORDER — ACETAMINOPHEN 650 MG RE SUPP: 650.0000 mg | Freq: Four times a day (QID) | RECTAL | Status: DC | PRN

## 2018-09-20 MED ORDER — INSULIN ASPART 100 UNIT/ML ~~LOC~~ SOLN
8.0000 [IU] | Freq: Three times a day (TID) | SUBCUTANEOUS | Status: DC
  Administered 2018-09-21: 8 [IU] via SUBCUTANEOUS
  Filled 2018-09-20: qty 1

## 2018-09-20 MED ORDER — SODIUM CHLORIDE 0.9 % IV BOLUS
1000.0000 mL | Freq: Once | INTRAVENOUS | Status: AC
  Administered 2018-09-20: 1000 mL via INTRAVENOUS

## 2018-09-20 MED ORDER — POTASSIUM CHLORIDE IN NACL 20-0.9 MEQ/L-% IV SOLN
INTRAVENOUS | Status: DC
  Administered 2018-09-21 – 2018-09-22 (×4): via INTRAVENOUS
  Filled 2018-09-20 (×6): qty 1000

## 2018-09-20 MED ORDER — INSULIN ASPART 100 UNIT/ML ~~LOC~~ SOLN: 0.0000 [IU] | Freq: Three times a day (TID) | SUBCUTANEOUS | Status: DC

## 2018-09-20 MED ORDER — METOPROLOL TARTRATE 25 MG PO TABS
25.0000 mg | ORAL_TABLET | Freq: Two times a day (BID) | ORAL | Status: DC
  Administered 2018-09-20 – 2018-09-24 (×8): 25 mg via ORAL
  Filled 2018-09-20 (×8): qty 1

## 2018-09-20 MED ORDER — APIXABAN 5 MG PO TABS
5.0000 mg | ORAL_TABLET | Freq: Two times a day (BID) | ORAL | Status: DC
  Administered 2018-09-20 – 2018-09-24 (×8): 5 mg via ORAL
  Filled 2018-09-20 (×8): qty 1

## 2018-09-20 MED ORDER — ONDANSETRON HCL 4 MG PO TABS: 4.0000 mg | ORAL_TABLET | Freq: Four times a day (QID) | ORAL | Status: DC | PRN

## 2018-09-20 MED ORDER — INSULIN ASPART 100 UNIT/ML ~~LOC~~ SOLN
0.0000 [IU] | Freq: Every day | SUBCUTANEOUS | Status: DC
  Administered 2018-09-20: 3 [IU] via SUBCUTANEOUS
  Filled 2018-09-20: qty 1

## 2018-09-20 MED ORDER — ALBUTEROL SULFATE (2.5 MG/3ML) 0.083% IN NEBU: 5.0000 mg | INHALATION_SOLUTION | RESPIRATORY_TRACT | Status: DC

## 2018-09-20 MED ORDER — ATORVASTATIN CALCIUM 20 MG PO TABS
40.0000 mg | ORAL_TABLET | Freq: Every evening | ORAL | Status: DC
  Administered 2018-09-21 – 2018-09-23 (×3): 40 mg via ORAL
  Filled 2018-09-20 (×3): qty 2

## 2018-09-20 NOTE — ED Notes (Signed)
[name]:  [phone] Daughter:  [phone]

## 2018-09-20 NOTE — ED Notes (Signed)
Patient transported to CT. CT tech verbalized pt would then be taken to Xray after.

## 2018-09-20 NOTE — ED Notes (Signed)
. ED TO INPATIENT HANDOFF REPORT  Name/Age/Gender Richard [name] [age] y.o. male  Code Status    Code Status Orders  (From admission, onward)         Start     Ordered   09/20/18 1636  Full code  Continuous     09/20/18 1635        Code Status History    Date Active Date Inactive Code Status Order ID Comments User Context   08/03/2018 1020 08/04/2018 1813 Full Code [id number]  Gorden Harms, MD ED   01/09/2018 1025 01/11/2018 1932 Full Code [id number]  Harrie Foreman, MD Inpatient   09/05/2017 1559 09/11/2017 1928 Full Code [id number]  Fritzi Mandes, MD Inpatient   08/24/2017 0238 08/29/2017 2131 Full Code [id number]  Lance Coon, MD Inpatient   08/10/2015 0822 08/11/2015 1947 Full Code [id number]  Harrie Foreman, MD Inpatient      Home/SNF/Other Home  Chief Complaint Hyperglycemia   Level of Care/Admitting Diagnosis ED Disposition    ED Disposition Condition [place]: [place] [100120]  Level of Care: Telemetry [5]  Diagnosis: A-fib [facility]) [[id number]]  Admitting Physician: Odessa Fleming  Attending Physician: Odessa Fleming  Estimated length of stay: past midnight tomorrow  Certification:: I certify this patient will need inpatient services for at least 2 midnights  PT Class (Do Not Modify): Inpatient [101]  PT Acc Code (Do Not Modify): Private [1]       Medical History Past Medical History:  Diagnosis Date  . Atrial fibrillation ([place])    per patient  . Cancer ([place])   . CHF (congestive heart failure) ([place])   . Chronic back pain   . Chronic leg pain   . Coronary artery disease   . Diabetes mellitus without complication ([place])   . Hypertension   . Neuropathy     Allergies No Known Allergies  IV Location/Drains/Wounds Patient Lines/Drains/Airways Status   Active Line/Drains/Airways    Name:   Placement date:   Placement time:   Site:   Days:   Peripheral IV 08/03/18 Right Forearm   08/03/18     0400    Forearm   48   Peripheral IV 08/03/18 Left Antecubital   08/03/18    0410    Antecubital   48   Peripheral IV 09/20/18 Left Antecubital   09/20/18    1341    Antecubital   less than 1   Peripheral IV Right Antecubital   -    -    Antecubital      Incision (Closed) 08/24/17 Abdomen Mid   08/24/17    0309     392   Wound / Incision (Open or Dehisced) 08/03/18 Other (Comment) Knee Right   08/03/18    1143    Knee   48          Labs/Imaging Results for orders placed or performed during the hospital encounter of 09/20/18 (from the past 48 hour(s))  Glucose, capillary     Status: Abnormal   Collection Time: 09/20/18  1:29 PM  Result Value Ref Range   Glucose-Capillary 421 (H) 70 - 99 mg/dL   Comment 1 Notify RN   I-STAT, chem 8     Status: Abnormal   Collection Time: 09/20/18  1:43 PM  Result Value Ref Range   Sodium 138 135 - 145 mmol/L   Potassium 3.3 (L) 3.5 - 5.1 mmol/L   Chloride 99 98 -  111 mmol/L   BUN <3 (L) 8 - 23 mg/dL   Creatinine, Ser 0.90 0.61 - 1.24 mg/dL   Glucose, Bld 421 (H) 70 - 99 mg/dL   Calcium, Ion 1.21 1.15 - 1.40 mmol/L   TCO2 29 22 - 32 mmol/L   Hemoglobin 11.6 (L) 13.0 - 17.0 g/dL   HCT 34.0 (L) 39.0 - 52.0 %  Comprehensive metabolic panel     Status: Abnormal   Collection Time: 09/20/18  1:45 PM  Result Value Ref Range   Sodium 138 135 - 145 mmol/L   Potassium 3.3 (L) 3.5 - 5.1 mmol/L   Chloride 99 98 - 111 mmol/L   CO2 28 22 - 32 mmol/L   Glucose, Bld 434 (H) 70 - 99 mg/dL   BUN 6 (L) 8 - 23 mg/dL   Creatinine, Ser 0.84 0.61 - 1.24 mg/dL   Calcium 9.1 8.9 - 10.3 mg/dL   Total Protein 6.9 6.5 - 8.1 g/dL   Albumin 3.8 3.5 - 5.0 g/dL   AST 22 15 - 41 U/L   ALT 16 0 - 44 U/L   Alkaline Phosphatase 26 (L) 38 - 126 U/L   Total Bilirubin 0.9 0.3 - 1.2 mg/dL   GFR calc non Af Amer >60 >60 mL/min   GFR calc Af Amer >60 >60 mL/min   Anion gap 11 5 - 15    Comment: Performed at [facility], [address] [place], [place] [address]   Lipase, blood     Status: None   Collection Time: 09/20/18  1:45 PM  Result Value Ref Range   Lipase 21 11 - 51 U/L    Comment: Performed at [facility], [address] [place], [place] [address]  Protime-INR     Status: None   Collection Time: 09/20/18  1:45 PM  Result Value Ref Range   Prothrombin Time 14.8 11.4 - 15.2 seconds   INR 1.17     Comment: Performed at [facility], [address] [place],  [address]  CBC     Status: Abnormal   Collection Time: 09/20/18  1:45 PM  Result Value Ref Range   WBC 7.7 4.0 - 10.5 K/uL   RBC 3.78 (L) 4.22 - 5.81 MIL/uL   Hemoglobin 11.2 (L) 13.0 - 17.0 g/dL   HCT 33.9 (L) 39.0 - 52.0 %   MCV 89.7 80.0 - 100.0 fL   MCH 29.6 26.0 - 34.0 pg   MCHC 33.0 30.0 - 36.0 g/dL   RDW 13.6 11.5 - 15.5 %   Platelets 321 150 - 400 K/uL   nRBC 0.0 0.0 - 0.2 %    Comment: Performed at [facility], [address] [place], [place] [address]  CG4 I-STAT (Lactic acid)     Status: None   Collection Time: 09/20/18  1:50 PM  Result Value Ref Range   Lactic Acid, Venous 0.90 0.5 - 1.9 mmol/L  Glucose, capillary     Status: Abnormal   Collection Time: 09/20/18  3:01 PM  Result Value Ref Range   Glucose-Capillary 362 (H) 70 - 99 mg/dL   Comment 1 Notify RN    Ct Head Wo Contrast  Result Date: 09/20/2018 CLINICAL DATA:  Hyperglycemia today.  Confusion. EXAM: CT HEAD WITHOUT CONTRAST TECHNIQUE: Contiguous axial images were obtained from the base of the skull through the vertex without intravenous contrast. COMPARISON:  None. FINDINGS: Brain: Ventricles, cisterns and other CSF spaces are within normal. There is mild chronic  ischemic microvascular disease. Minimal right-sided basal ganglia calcifications. No evidence of focal mass, mass effect, shift of midline structures or acute hemorrhage. No evidence of acute infarction. Vascular: No hyperdense vessel or unexpected calcification. Skull: Normal. Negative for fracture or  focal lesion. Sinuses/Orbits: No acute finding. Other: None. IMPRESSION: No acute findings. Minimal chronic ischemic microvascular disease. Electronically Signed   By: Marin Olp M.D.   On: 09/20/2018 14:12   Dg Chest Port 1 View  Result Date: 09/20/2018 CLINICAL DATA:  Hyperglycemia. EXAM: PORTABLE CHEST 1 VIEW COMPARISON:  Chest CT 08/03/2018 FINDINGS: Cardiomediastinal silhouette is normal. Mediastinal contours appear intact. Calcific atherosclerotic disease at the aortic arch. There is no evidence of focal airspace consolidation, pleural effusion or pneumothorax. Osseous structures are without acute abnormality. Soft tissues are grossly normal. IMPRESSION: No active disease. Electronically Signed   By: Fidela Salisbury M.D.   On: 09/20/2018 14:26    Pending Labs Unresulted Labs (From admission, onward)    Start     Ordered   09/21/18 3335  Basic metabolic panel  Tomorrow morning,   STAT     09/20/18 1635   09/20/18 1542  TSH  ONCE - STAT,   STAT     09/20/18 1541   09/20/18 1542  Ammonia  ONCE - STAT,   STAT     09/20/18 1541   09/20/18 1339  Urinalysis, Routine w reflex microscopic  ONCE - STAT,   STAT     09/20/18 1340   09/20/18 1332  Urinalysis, Complete w Microscopic  ONCE - STAT,   STAT     09/20/18 1332          Vitals/Pain Today's Vitals   09/20/18 1600 09/20/18 1615 09/20/18 1630 09/20/18 1645  BP: 128/81 108/79 99/72 98/72   Pulse: 76 74 70 69  Resp:      Temp:      TempSrc:      SpO2: 99% 100% 99% 100%  Weight:      PainSc:        Isolation Precautions No active isolations  Medications Medications  0.9 % NaCl with KCl 20 mEq/ L  infusion (has no administration in time range)  acetaminophen (TYLENOL) tablet 650 mg (has no administration in time range)    Or  acetaminophen (TYLENOL) suppository 650 mg (has no administration in time range)  ondansetron (ZOFRAN) tablet 4 mg (has no administration in time range)    Or  ondansetron (ZOFRAN) injection 4  mg (has no administration in time range)  insulin aspart (novoLOG) injection 0-15 Units (has no administration in time range)  aspirin chewable tablet 81 mg (has no administration in time range)  atorvastatin (LIPITOR) tablet 40 mg (has no administration in time range)  metoprolol tartrate (LOPRESSOR) tablet 25 mg (has no administration in time range)  insulin aspart (novoLOG) injection 8 Units (has no administration in time range)  apixaban (ELIQUIS) tablet 5 mg (has no administration in time range)  sodium chloride 0.9 % bolus 1,000 mL (0 mLs Intravenous Stopped 09/20/18 1657)  sodium chloride 0.9 % bolus 1,000 mL (0 mLs Intravenous Stopped 09/20/18 1658)  potassium chloride 10 mEq in 100 mL IVPB (0 mEq Intravenous Stopped 09/20/18 1658)  metoprolol tartrate (LOPRESSOR) injection 2.5 mg (2.5 mg Intravenous Given 09/20/18 1514)  insulin aspart (novoLOG) injection 5 Units (5 Units Intravenous Given 09/20/18 1514)    Mobility walks

## 2018-09-20 NOTE — ED Provider Notes (Signed)
[facility] Emergency Department Provider Note ____________________________________________   First MD Initiated Contact with Patient 09/20/18 1338     (approximate)  I have reviewed the triage vital signs and the nursing notes.  HISTORY  Chief Complaint Hyperglycemia  HPI Maleak Brazzel is a 62 y.o. male presents for evaluation for confusion and high blood sugar   EM caveat: Patient somewhat somnolent, slightly confused unable to provide a distinct history.  No family immediately available and history is obtained from EMS  and patient  Patient will tell me his blood sugars been high, is not been feeling well for several days.  EMS reports blood sugar read as "high" with EMS.  IV and fluids initiated by EMS.  Patient reports he is lost considerable amount of weight recently after possible diagnosis of some type of abdominal cancer.  He had previous surgery and reports he is lost about 100 pounds  He does have diabetes, his blood sugars been "high" for a day or 2.  He denies being in any pain.  No fevers or chills.  Denies feeling sick other than nauseated  Past Medical History:  Diagnosis Date  . Atrial fibrillation ([place])    per patient  . Cancer ([place])   . CHF (congestive heart failure) ([place])   . Chronic back pain   . Chronic leg pain   . Coronary artery disease   . Diabetes mellitus without complication ([place])   . Hypertension   . Neuropathy     Patient Active Problem List   Diagnosis Date Noted  . Hyperglycemia 08/03/2018  . Protein-calorie malnutrition, severe 01/10/2018  . Sepsis ([place]) 01/09/2018  . DKA, type 2 ([place]) 09/05/2017  . Ileus ([place]) 08/24/2017  . Diabetes ([place]) 08/24/2017  . Atrial flutter ([place]) 08/24/2017  . CAD (coronary artery disease) 08/24/2017  . HTN (hypertension) 08/24/2017  . Atrial fibrillation ([place]) 08/24/2017  . Tobacco use disorder, severe, dependence 07/25/2017  . Wound dehiscence, surgical, initial encounter  06/24/2017  . Acute hematogenous osteomyelitis of right foot ([place]) 06/20/2017  . 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 Esophagitis 06/20/2017  . Cellulitis of right lower extremity 06/10/2017  . Hypertension, poor control 06/10/2017  . Insulin dependent type 2 diabetes mellitus ([place]) 06/10/2017  . Chest pain 08/10/2015    Past Surgical History:  Procedure Laterality Date  . CORONARY ANGIOPLASTY WITH STENT PLACEMENT    . ESOPHAGOGASTRODUODENOSCOPY N/A 09/11/2017   Procedure: ESOPHAGOGASTRODUODENOSCOPY (EGD);  Surgeon: Virgel Manifold, MD;  Location: [facility] ENDOSCOPY;  Service: Endoscopy;  Laterality: N/A;  . pancreas removed      Prior to Admission medications   Medication Sig Start Date End Date Taking? Authorizing Provider  ACCU-CHEK AVIVA PLUS test strip U ONCE TO BID UTD 09/17/17   [provider]  ACCU-CHEK SOFTCLIX LANCETS lancets TEST 1-2 XD 09/17/17   [provider]  acetaminophen (TYLENOL) 325 MG tablet Take 1 tablet (325 mg total) by mouth every 6 (six) hours as needed for mild pain (or Fever >/= 101). 01/11/18   Gouru, Aruna, MD  amitriptyline (ELAVIL) 50 MG tablet Take 1 tablet (50 mg total) by mouth at bedtime. 08/04/18   Salary, Avel Peace, MD  apixaban (ELIQUIS) 5 MG TABS tablet Take 1 tablet (5 mg total) by mouth 2 (two) times daily. Patient not taking: Reported on 08/03/2018 01/11/18   Nicholes Mango, MD  aspirin 81 MG chewable tablet Chew 1 tablet (81 mg total) by mouth daily. 01/12/18   Nicholes Mango, MD  atorvastatin (LIPITOR) 40 MG tablet Take 1 tablet (40 mg total) by mouth every evening. Patient not taking: Reported on 08/03/2018 01/11/18   Nicholes Mango, MD  Blood Glucose Monitoring Suppl (ACCU-CHEK AVIVA PLUS) w/Device KIT U TO TEST ONCE TO BID 09/17/17   [provider]  cyclobenzaprine (FLEXERIL) 5 MG tablet Take 1 tablet (5 mg total) by mouth 3 (three) times daily as needed for muscle spasms. 08/04/18   Salary, Avel Peace, MD    gabapentin (NEURONTIN) 100 MG capsule Take 1 capsule (100 mg total) by mouth 3 (three) times daily. 08/04/18   Salary, Avel Peace, MD  GLOBAL EASE INJECT PEN NEEDLES 31G X 5 MM MISC Inject 1 Units into the skin daily. 09/11/17   Hillary Bow, MD  insulin aspart (NOVOLOG) 100 UNIT/ML injection Inject 8 Units into the skin 3 (three) times daily before meals. May have insulin pens 08/04/18   Salary, Holly Bodily D, MD  Insulin Glargine (LANTUS SOLOSTAR) 100 UNIT/ML Solostar Pen Inject 22 Units into the skin at bedtime. 08/04/18   Salary, Avel Peace, MD  metoprolol tartrate (LOPRESSOR) 25 MG tablet Take 1 tablet (25 mg total) by mouth 2 (two) times daily. Patient not taking: Reported on 08/03/2018 01/11/18   Nicholes Mango, MD  Multiple Vitamin (MULTIVITAMIN WITH MINERALS) TABS tablet Take 1 tablet by mouth daily. 01/12/18   Nicholes Mango, MD  Needles & Syringes MISC Use as directed 01/31/16   Jola Schmidt, MD  protein supplement shake (PREMIER PROTEIN) LIQD Take 325 mLs (11 oz total) by mouth 2 (two) times daily between meals. 01/12/18   Nicholes Mango, MD  traZODone (DESYREL) 50 MG tablet Take 1 tablet (50 mg total) by mouth at bedtime as needed for sleep. 08/04/18   Salary, Holly Bodily D, MD  VOLTAREN 1 % GEL Apply 2 g topically 4 (four) times daily.  11/11/17   [provider]    Allergies Patient has no known allergies.  Family History  Problem Relation Age of Onset  . CAD Mother   . Diabetes Mellitus II Father     Social History Social History   Tobacco Use  . Smoking status: Former Smoker    Packs/day: 0.50  . Smokeless tobacco: Never Used  Substance Use Topics  . Alcohol use: No    Alcohol/week: 0.0 standard drinks  . 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 Cocaine used over a year ago per pt.    Review of Systems EM caveat   ____________________________________________   PHYSICAL EXAM:  VITAL SIGNS: ED Triage Vitals  Enc Vitals Group      BP 09/20/18 1330 (!) 150/80     Pulse --      Resp --      Temp 09/20/18 1330 98.7 F (37.1 C)     Temp Source 09/20/18 1330 Oral     SpO2 09/20/18 1330 99 %     Weight 09/20/18 1331 110 lb (49.9 kg)     Height --      Head Circumference --      Peak Flow --      Pain Score 09/20/18 1331 0     Pain Loc --      Pain Edu? --      Excl. in [place]? --     Constitutional: Alert and oriented to self and place, but somewhat somnolent at times but easily arousable to voice and is admitted able to maintain eye contact in  conversation but prefers to rest with head on the bed.  Appears moderately ill..  Eyes: Conjunctivae are normal. Head: Atraumatic. Nose: No congestion/rhinnorhea. Mouth/Throat: Mucous membranes are very dry. Neck: No stridor.  Cardiovascular: Slightly tachycardic rate, regular rhythm. Grossly normal heart sounds.  Good peripheral circulation. Respiratory: Very mild tachypnea, somewhat increased depth of respirations.  No retractions. Lungs CTAB. Gastrointestinal: Soft and nontender. No distention.  Old scar noted. Musculoskeletal: No lower extremity tenderness nor edema. Neurologic:  Normal speech and language. No gross focal neurologic deficits are appreciated.  Skin:  Skin is warm, dry and intact. No rash noted. Psychiatric: Mood and affect are normal. Speech and behavior are normal.  ____________________________________________   LABS (all labs ordered are listed, but only abnormal results are displayed)  Labs Reviewed  GLUCOSE, CAPILLARY - Abnormal; Notable for the following components:      Result Value   Glucose-Capillary 421 (*)    All other components within normal limits  COMPREHENSIVE METABOLIC PANEL - Abnormal; Notable for the following components:   Potassium 3.3 (*)    Glucose, Bld 434 (*)    BUN 6 (*)    Alkaline Phosphatase 26 (*)    All other components within normal limits  CBC - Abnormal; Notable for the following components:   RBC 3.78 (*)     Hemoglobin 11.2 (*)    HCT 33.9 (*)    All other components within normal limits  GLUCOSE, CAPILLARY - Abnormal; Notable for the following components:   Glucose-Capillary 362 (*)    All other components within normal limits  POCT I-STAT, CHEM 8 - Abnormal; Notable for the following components:   Potassium 3.3 (*)    BUN <3 (*)    Glucose, Bld 421 (*)    Hemoglobin 11.6 (*)    HCT 34.0 (*)    All other components within normal limits  LIPASE, BLOOD  PROTIME-INR  URINALYSIS, COMPLETE (UACMP) WITH MICROSCOPIC  URINALYSIS, ROUTINE W REFLEX MICROSCOPIC  TSH  AMMONIA  CBG MONITORING, ED  I-STAT CG4 LACTIC ACID, ED  I-STAT CHEM 8, ED  CG4 I-STAT (LACTIC ACID)  I-STAT CG4 LACTIC ACID, ED  I-STAT VENOUS BLOOD GAS, ED  CBG MONITORING, ED  CBG MONITORING, ED  CBG MONITORING, ED  CBG MONITORING, ED  CBG MONITORING, ED  CBG MONITORING, ED  CBG MONITORING, ED   ____________________________________________  EKG  Reviewed and entered by me at 1505 Heart rate 149 QRS 130 QTc 520 Atrial fibrillation with rapid ventricular response, possibly some areas of flutter as well are noted, to be intermittent flutter with atrial fibrillation noted on rhythm strip  ----------------------------------------- 3:30 PM on 09/20/2018 -----------------------------------------  After receiving metoprolol, heart rate is normalized to 80, blood pressure 150/91.  Appears much improved. ____________________________________________  RADIOLOGY  Ct Head Wo Contrast  Result Date: 09/20/2018 CLINICAL DATA:  Hyperglycemia today.  Confusion. EXAM: CT HEAD WITHOUT CONTRAST TECHNIQUE: Contiguous axial images were obtained from the base of the skull through the vertex without intravenous contrast. COMPARISON:  None. FINDINGS: Brain: Ventricles, cisterns and other CSF spaces are within normal. There is mild chronic ischemic microvascular disease. Minimal right-sided basal ganglia calcifications. No evidence of  focal mass, mass effect, shift of midline structures or acute hemorrhage. No evidence of acute infarction. Vascular: No hyperdense vessel or unexpected calcification. Skull: Normal. Negative for fracture or focal lesion. Sinuses/Orbits: No acute finding. Other: None. IMPRESSION: No acute findings. Minimal chronic ischemic microvascular disease. Electronically Signed   By: Marin Olp M.D.  On: 09/20/2018 14:12   Dg Chest Port 1 View  Result Date: 09/20/2018 CLINICAL DATA:  Hyperglycemia. EXAM: PORTABLE CHEST 1 VIEW COMPARISON:  Chest CT 08/03/2018 FINDINGS: Cardiomediastinal silhouette is normal. Mediastinal contours appear intact. Calcific atherosclerotic disease at the aortic arch. There is no evidence of focal airspace consolidation, pleural effusion or pneumothorax. Osseous structures are without acute abnormality. Soft tissues are grossly normal. IMPRESSION: No active disease. Electronically Signed   By: Fidela Salisbury M.D.   On: 09/20/2018 14:26     ____________________________________________   PROCEDURES  Procedure(s) performed: None  Procedures  Critical Care performed: Yes, see critical care note(s)  CRITICAL CARE Performed by: Delman Kitten   Total critical care time: 38 minutes  Critical care time was exclusive of separately billable procedures and treating other patients.  Critical care was necessary to treat or prevent imminent or life-threatening deterioration.  Critical care was time spent personally by me on the following activities: development of treatment plan with patient and/or surrogate as well as nursing, discussions with consultants, evaluation of patient's response to treatment, examination of patient, obtaining history from patient or surrogate, ordering and performing treatments and interventions, ordering and review of laboratory studies, ordering and review of radiographic studies, pulse oximetry and re-evaluation of patient's condition.  After  receiving volume resuscitation, patient noted to have a rapid change in heart rate requiring IV antiarrhythmic for A. fib with rapid ventricular response.  Patient was at elevated risk for cardiovascular collapse, treated promptly with improvement. ____________________________________________   INITIAL IMPRESSION / ASSESSMENT AND PLAN / ED COURSE  Pertinent labs & imaging results that were available during my care of the patient were reviewed by me and considered in my medical decision making (see chart for details).   Patient is for evaluation, girlfriend arrived and reports that this morning he was extremely fatigued and somewhat hard to arouse.  This prompted them to call EMS after he was unable to get out of bed into the late morning.  He has no focal neurologic deficits.  He is however somnolent, will follow commands.  Hyperglycemia noted and is improved after EMS treatment, but still requiring fluid bolus and IV insulin.  Does not show evidence of DKA at this time on lab work.  Suspect he may have become rapidly dehydrated with his hyperglycemia or other considerations would be other etiologies such as toxic, metabolic, central neurologic processes as well.  CT the head is normal with regard to no acute change    I STAT glucose > 400, AG 14, K 3.3. Will start small amount of KCL IV at this time. Possible DKA vs. Hyperglycemia. Continue fluid resuscitation. '  ----------------------------------------- 3:43 PM on 09/20/2018 -----------------------------------------  Patient is improving, mental status slowly improving more alert but still somewhat somnolent.  At this time, will admit for further work-up.  Discussed with Dr. Posey Pronto.   ____________________________________________   FINAL CLINICAL IMPRESSION(S) / ED DIAGNOSES  Final diagnoses:  Atrial fibrillation with rapid ventricular response ([place])  Altered mental status, unspecified altered mental status type  Somnolence  Dehydration   Hyperglycemia        Note:  This document was prepared using Dragon voice recognition software and may include unintentional dictation errors       Delman Kitten, MD 09/20/18 1545

## 2018-09-20 NOTE — H&P (Signed)
Richard [name] NAME: Richard [name]    MR#:  [id number]  DATE OF BIRTH:  02/26/1956  DATE OF ADMISSION:  09/20/2018  PRIMARY CARE PHYSICIAN: [facility] [place]   REQUESTING/REFERRING PHYSICIAN: Dr Jacqualine Code  CHIEF COMPLAINT:   Increasing confusion and high blood sugar  History is obtained from patient's fianc. Pt currently is sleepy wakes up momentarily and answers couple questions HISTORY OF PRESENT ILLNESS:  Richard [name]  is a 62 y.o. male with a known history of type II diabetes on insulin, a fib supposed to be on oral anticoagulation not sure if he is taking, hyperlipidemia, chronic pain syndrome, adenocarcinoma of the pancreas status post surgery comes to the emergency room after patient was found to be confused at home. He is not been feeling well and sugars have been reading high at home.  He was found to have sugars in the 400s appears clinically dehydrated.  Discussing with patient's fianc it seems patient has not taken his long-acting insulin for one month. He was prescribed Lantus however fiance tells me that levemir is covered but they did not get it--?reason  Patient went into rapid a fib and received 2.5 mg of IV metoprolol heart rate now is in the 80s.  He is being admitted for hyperglycemia secondary to medication noncompliance, rapid a fib, dehydration.  PAST MEDICAL HISTORY:   Past Medical History:  Diagnosis Date  . Atrial fibrillation ([place])    per patient  . Cancer ([place])   . CHF (congestive heart failure) ([place])   . Chronic back pain   . Chronic leg pain   . Coronary artery disease   . Diabetes mellitus without complication ([place])   . Hypertension   . Neuropathy     PAST SURGICAL HISTOIRY:   Past Surgical History:  Procedure Laterality Date  . CORONARY ANGIOPLASTY WITH STENT PLACEMENT    . ESOPHAGOGASTRODUODENOSCOPY N/A 09/11/2017   Procedure: ESOPHAGOGASTRODUODENOSCOPY (EGD);   Surgeon: Virgel Manifold, MD;  Location: [facility] ENDOSCOPY;  Service: Endoscopy;  Laterality: N/A;  . pancreas removed      SOCIAL HISTORY:   Social History   Tobacco Use  . Smoking status: Former Smoker    Packs/day: 0.50  . Smokeless tobacco: Never Used  Substance Use Topics  . Alcohol use: No    Alcohol/week: 0.0 standard drinks    FAMILY HISTORY:   Family History  Problem Relation Age of Onset  . CAD Mother   . Diabetes Mellitus II Father     DRUG ALLERGIES:  No Known Allergies  REVIEW OF SYSTEMS:  Review of Systems  Unable to perform ROS: Mental acuity     MEDICATIONS AT HOME:   Prior to Admission medications   Medication Sig Start Date End Date Taking? Authorizing Provider  ACCU-CHEK AVIVA PLUS test strip U ONCE TO BID UTD 09/17/17   [provider]  ACCU-CHEK SOFTCLIX LANCETS lancets TEST 1-2 XD 09/17/17   [provider]  acetaminophen (TYLENOL) 325 MG tablet Take 1 tablet (325 mg total) by mouth every 6 (six) hours as needed for mild pain (or Fever >/= 101). 01/11/18   Gouru, Aruna, MD  amitriptyline (ELAVIL) 50 MG tablet Take 1 tablet (50 mg total) by mouth at bedtime. 08/04/18   Salary, Avel Peace, MD  apixaban (ELIQUIS) 5 MG TABS tablet Take 1 tablet (5 mg total) by mouth 2 (two) times daily. Patient not taking: Reported on 08/03/2018 01/11/18   Gouru,  Illene Silver, MD  aspirin 81 MG chewable tablet Chew 1 tablet (81 mg total) by mouth daily. 01/12/18   Nicholes Mango, MD  atorvastatin (LIPITOR) 40 MG tablet Take 1 tablet (40 mg total) by mouth every evening. Patient not taking: Reported on 08/03/2018 01/11/18   Nicholes Mango, MD  Blood Glucose Monitoring Suppl (ACCU-CHEK AVIVA PLUS) w/Device KIT U TO TEST ONCE TO BID 09/17/17   [provider]  cyclobenzaprine (FLEXERIL) 5 MG tablet Take 1 tablet (5 mg total) by mouth 3 (three) times daily as needed for muscle spasms. 08/04/18   Salary, Avel Peace, MD  gabapentin (NEURONTIN) 100 MG capsule  Take 1 capsule (100 mg total) by mouth 3 (three) times daily. 08/04/18   Salary, Avel Peace, MD  GLOBAL EASE INJECT PEN NEEDLES 31G X 5 MM MISC Inject 1 Units into the skin daily. 09/11/17   Hillary Bow, MD  insulin aspart (NOVOLOG) 100 UNIT/ML injection Inject 8 Units into the skin 3 (three) times daily before meals. May have insulin pens 08/04/18   Salary, Holly Bodily D, MD  Insulin Glargine (LANTUS SOLOSTAR) 100 UNIT/ML Solostar Pen Inject 22 Units into the skin at bedtime. 08/04/18   Salary, Avel Peace, MD  metoprolol tartrate (LOPRESSOR) 25 MG tablet Take 1 tablet (25 mg total) by mouth 2 (two) times daily. Patient not taking: Reported on 08/03/2018 01/11/18   Nicholes Mango, MD  Multiple Vitamin (MULTIVITAMIN WITH MINERALS) TABS tablet Take 1 tablet by mouth daily. 01/12/18   Nicholes Mango, MD  Needles & Syringes MISC Use as directed 01/31/16   Jola Schmidt, MD  protein supplement shake (PREMIER PROTEIN) LIQD Take 325 mLs (11 oz total) by mouth 2 (two) times daily between meals. 01/12/18   Nicholes Mango, MD  traZODone (DESYREL) 50 MG tablet Take 1 tablet (50 mg total) by mouth at bedtime as needed for sleep. 08/04/18   Salary, Holly Bodily D, MD  VOLTAREN 1 % GEL Apply 2 g topically 4 (four) times daily.  11/11/17   [provider]      VITAL SIGNS:  Blood pressure (!) 115/91, pulse 80, temperature 98.7 F (37.1 C), temperature source Oral, resp. rate 14, weight 49.9 kg, SpO2 100 %.  PHYSICAL EXAMINATION:  GENERAL:  62 y.o.-year-old patient lying in the bed with no acute distress.  EYES: Pupils equal, round, reactive to light and accommodation. No scleral icterus. Extraocular muscles intact.  HEENT: Head atraumatic, normocephalic. Oropharynx and nasopharynx clear. Oral mucosa dry NECK:  Supple, no jugular venous distention. No thyroid enlargement, no tenderness.  LUNGS: Normal breath sounds bilaterally, no wheezing, rales,rhonchi or crepitation. No use of accessory muscles of respiration.   CARDIOVASCULAR: S1, S2 normal. No murmurs, rubs, or gallops.  ABDOMEN: Soft, nontender, nondistended. Bowel sounds present. No organomegaly or mass.  EXTREMITIES: No pedal edema, cyanosis, or clubbing.  NEUROLOGIC: Cranial nerves II through XII are intact. Moves all extremities spontaneously. Appears week  PSYCHIATRIC: The patient  Remains sleepy during my conversation however did answer a couple questions  SKIN: No obvious rash, lesion, or ulcer.   LABORATORY PANEL:   CBC Recent Labs  Lab 09/20/18 1345  WBC 7.7  HGB 11.2*  HCT 33.9*  PLT 321   ------------------------------------------------------------------------------------------------------------------  Chemistries  Recent Labs  Lab 09/20/18 1345  NA 138  K 3.3*  CL 99  CO2 28  GLUCOSE 434*  BUN 6*  CREATININE 0.84  CALCIUM 9.1  AST 22  ALT 16  ALKPHOS 26*  BILITOT 0.9   ------------------------------------------------------------------------------------------------------------------  Cardiac  Enzymes No results for input(s): TROPONINI in the last 168 hours. ------------------------------------------------------------------------------------------------------------------  RADIOLOGY:  Ct Head Wo Contrast  Result Date: 09/20/2018 CLINICAL DATA:  Hyperglycemia today.  Confusion. EXAM: CT HEAD WITHOUT CONTRAST TECHNIQUE: Contiguous axial images were obtained from the base of the skull through the vertex without intravenous contrast. COMPARISON:  None. FINDINGS: Brain: Ventricles, cisterns and other CSF spaces are within normal. There is mild chronic ischemic microvascular disease. Minimal right-sided basal ganglia calcifications. No evidence of focal mass, mass effect, shift of midline structures or acute hemorrhage. No evidence of acute infarction. Vascular: No hyperdense vessel or unexpected calcification. Skull: Normal. Negative for fracture or focal lesion. Sinuses/Orbits: No acute finding. Other: None.  IMPRESSION: No acute findings. Minimal chronic ischemic microvascular disease. Electronically Signed   By: Marin Olp M.D.   On: 09/20/2018 14:12   Dg Chest Port 1 View  Result Date: 09/20/2018 CLINICAL DATA:  Hyperglycemia. EXAM: PORTABLE CHEST 1 VIEW COMPARISON:  Chest CT 08/03/2018 FINDINGS: Cardiomediastinal silhouette is normal. Mediastinal contours appear intact. Calcific atherosclerotic disease at the aortic arch. There is no evidence of focal airspace consolidation, pleural effusion or pneumothorax. Osseous structures are without acute abnormality. Soft tissues are grossly normal. IMPRESSION: No active disease. Electronically Signed   By: Fidela Salisbury M.D.   On: 09/20/2018 14:26    EKG:  Afib with RVR  IMPRESSION AND PLAN:  Dryden Tapley  is a 62 y.o. male with a known history of type II diabetes on insulin, a fib supposed to be on oral anticoagulation not sure if he is taking, hyperlipidemia, chronic pain syndrome, adenocarcinoma of the pancreas status post surgery comes to the emergency room after patient was found to be confused at home. He is not been feeling well and sugars have been reading high at home.  1. hyperglycemia in the setting of type II diabetes secondary to medication noncompliance -patient has not been taking his long-acting insulin for last one month -start patient on Lantus 20 units daily and NovoLog eight units TID -please prescribe Levemir at discharge -IV fluids -sliding scale insulin  2. rapid a fib with RVR in the setting of dehydration -metoprolol 25 BID -continue eliquis--- care management to help with meds at home  3. Dehydration due to number one -IV fluids  4. Hypokalemia replete  5. DVT prophylaxis subcu Lovenox   D/w fiance  All the records are reviewed and case discussed with ED provider. Management plans discussed with the patient, family and they are in agreement.  CODE STATUS: Full  TOTAL TIME TAKING CARE OF THIS PATIENT:  50 minutes.    Fritzi Mandes M.D on 09/20/2018 at 4:07 PM  Between 7am to 6pm - Pager - [phone]  After 6pm go to [email or web address] - password [email or web address] [facility]  [facility]  Office  [phone]  CC: Primary care physician; [facility] [place]

## 2018-09-20 NOTE — ED Triage Notes (Signed)
Pt presents today via [facility] for high sugar. Pt's home glucometer just stated high. Pt is a diabetic and take novolog and metformin

## 2018-09-21 LAB — GLUCOSE, CAPILLARY
Glucose-Capillary: 69 mg/dL — ABNORMAL LOW (ref 70–99)
Glucose-Capillary: 94 mg/dL (ref 70–99)
Glucose-Capillary: 318 mg/dL — ABNORMAL HIGH (ref 70–99)
Glucose-Capillary: 192 mg/dL — ABNORMAL HIGH (ref 70–99)

## 2018-09-21 LAB — BASIC METABOLIC PANEL
Anion gap: 9 (ref 5–15)
BUN: 8 mg/dL (ref 8–23)
CO2: 26 mmol/L (ref 22–32)
Calcium: 8.6 mg/dL — ABNORMAL LOW (ref 8.9–10.3)
Chloride: 107 mmol/L (ref 98–111)
Creatinine, Ser: 0.72 mg/dL (ref 0.61–1.24)
GFR calc Af Amer: >60 mL/min (ref >60)
GFR calc non Af Amer: >60 mL/min (ref >60)
Glucose, Bld: 181 mg/dL — ABNORMAL HIGH (ref 70–99)
Potassium: 3.5 mmol/L (ref 3.5–5.1)
Sodium: 142 mmol/L (ref 135–145)

## 2018-09-21 MED ORDER — OXYCODONE HCL 5 MG PO TABS
5.0000 mg | ORAL_TABLET | Freq: Once | ORAL | Status: AC
  Administered 2018-09-21: 5 mg via ORAL
  Filled 2018-09-21: qty 1

## 2018-09-21 MED ORDER — INSULIN ASPART 100 UNIT/ML ~~LOC~~ SOLN
4.0000 [IU] | Freq: Three times a day (TID) | SUBCUTANEOUS | Status: DC
  Administered 2018-09-21 – 2018-09-24 (×9): 4 [IU] via SUBCUTANEOUS
  Filled 2018-09-21 (×9): qty 1

## 2018-09-21 MED ORDER — SODIUM CHLORIDE 0.9% FLUSH
3.0000 mL | Freq: Two times a day (BID) | INTRAVENOUS | Status: DC
  Administered 2018-09-21 – 2018-09-24 (×6): 3 mL via INTRAVENOUS

## 2018-09-21 NOTE — Plan of Care (Signed)
He continues to have wide variations in his CBG.

## 2018-09-21 NOTE — Progress Notes (Signed)
Glucometer not flowing over. Bedtime CBG 293 at 2153

## 2018-09-21 NOTE — Progress Notes (Signed)
RNCM team with consult for medication assistance. Patient struggles to pay for insulin and eliquis. Per patient he plans to obtain levemir script opposed to lantus which he was told was significantly cheaper. I have communicated this with Dr Margaretmary Eddy and she plans to write prescription for levemir. Patient also plans to see if Suzie Portela is cheaper for his medicines, he currently uses [facility]. Eliquis coupon provided.

## 2018-09-21 NOTE — Progress Notes (Signed)
[place] [facility] [place] NAME: Richard [name]    MR#:  [id number]  DATE OF BIRTH:  12/25/1955  SUBJECTIVE:  CHIEF COMPLAINT: Patient is resting comfortably.  Reports he stopped taking Eliquis as he was feeling better.  Could not afford Lantus Patient was initially hyperglycemic but eventually he became hypoglycemic did not receive any Lantus last night REVIEW OF SYSTEMS:  CONSTITUTIONAL: No fever, fatigue or weakness.  EYES: No blurred or double vision.  EARS, NOSE, AND THROAT: No tinnitus or ear pain.  RESPIRATORY: No cough, shortness of breath, wheezing or hemoptysis.  CARDIOVASCULAR: No chest pain, orthopnea, edema.  GASTROINTESTINAL: No nausea, vomiting, diarrhea or abdominal pain.  GENITOURINARY: No dysuria, hematuria.  ENDOCRINE: No polyuria, nocturia,  HEMATOLOGY: No anemia, easy bruising or bleeding SKIN: No rash or lesion. MUSCULOSKELETAL: No joint pain or arthritis.   NEUROLOGIC: No tingling, numbness, weakness.  PSYCHIATRY: No anxiety or depression.   DRUG ALLERGIES:  No Known Allergies  VITALS:  Blood pressure 118/73, pulse 64, temperature 98.6 F (37 C), temperature source Oral, resp. rate (!) 21, weight 54.9 kg, SpO2 98 %.  PHYSICAL EXAMINATION:  GENERAL:  62 y.o.-year-old patient lying in the bed with no acute distress.  EYES: Pupils equal, round, reactive to light and accommodation. No scleral icterus. Extraocular muscles intact.  HEENT: Head atraumatic, normocephalic. Oropharynx and nasopharynx clear.  NECK:  Supple, no jugular venous distention. No thyroid enlargement, no tenderness.  LUNGS: Normal breath sounds bilaterally, no wheezing, rales,rhonchi or crepitation. No use of accessory muscles of respiration.  CARDIOVASCULAR: S1, S2 normal. No murmurs, rubs, or gallops.  ABDOMEN: Soft, nontender, nondistended. Bowel sounds present. No organomegaly or mass.  EXTREMITIES: No pedal edema, cyanosis, or clubbing.   NEUROLOGIC: Cranial nerves II through XII are intact. Muscle strength 5/5 in all extremities. Sensation intact. Gait not checked.  PSYCHIATRIC: The patient is alert and oriented x 3.  SKIN: No obvious rash, lesion, or ulcer.    LABORATORY PANEL:   CBC Recent Labs  Lab 09/20/18 1345  WBC 7.7  HGB 11.2*  HCT 33.9*  PLT 321   ------------------------------------------------------------------------------------------------------------------  Chemistries  Recent Labs  Lab 09/20/18 1345 09/21/18 0447  NA 138 142  K 3.3* 3.5  CL 99 107  CO2 28 26  GLUCOSE 434* 181*  BUN 6* 8  CREATININE 0.84 0.72  CALCIUM 9.1 8.6*  AST 22  --   ALT 16  --   ALKPHOS 26*  --   BILITOT 0.9  --    ------------------------------------------------------------------------------------------------------------------  Cardiac Enzymes No results for input(s): TROPONINI in the last 168 hours. ------------------------------------------------------------------------------------------------------------------  RADIOLOGY:  Ct Head Wo Contrast  Result Date: 09/20/2018 CLINICAL DATA:  Hyperglycemia today.  Confusion. EXAM: CT HEAD WITHOUT CONTRAST TECHNIQUE: Contiguous axial images were obtained from the base of the skull through the vertex without intravenous contrast. COMPARISON:  None. FINDINGS: Brain: Ventricles, cisterns and other CSF spaces are within normal. There is mild chronic ischemic microvascular disease. Minimal right-sided basal ganglia calcifications. No evidence of focal mass, mass effect, shift of midline structures or acute hemorrhage. No evidence of acute infarction. Vascular: No hyperdense vessel or unexpected calcification. Skull: Normal. Negative for fracture or focal lesion. Sinuses/Orbits: No acute finding. Other: None. IMPRESSION: No acute findings. Minimal chronic ischemic microvascular disease. Electronically Signed   By: Marin Olp M.D.   On: 09/20/2018 14:12   Dg Chest Port 1  View  Result Date: 09/20/2018 CLINICAL DATA:  Hyperglycemia. EXAM: PORTABLE  CHEST 1 VIEW COMPARISON:  Chest CT 08/03/2018 FINDINGS: Cardiomediastinal silhouette is normal. Mediastinal contours appear intact. Calcific atherosclerotic disease at the aortic arch. There is no evidence of focal airspace consolidation, pleural effusion or pneumothorax. Osseous structures are without acute abnormality. Soft tissues are grossly normal. IMPRESSION: No active disease. Electronically Signed   By: Fidela Salisbury M.D.   On: 09/20/2018 14:26    EKG:   Orders placed or performed during the hospital encounter of 09/20/18  . ED EKG 12-Lead  . ED EKG 12-Lead  . EKG 12-Lead  . EKG 12-Lead  . ED EKG  . ED EKG  . EKG 12-Lead  . EKG 12-Lead  . EKG 12-Lead  . EKG 12-Lead  . EKG 12-Lead  . EKG 12-Lead  . EKG 12-Lead  . EKG 12-Lead  . EKG    ASSESSMENT AND PLAN:    Richard [name]  is a 62 y.o. male with a known history of type II diabetes on insulin, a fib supposed to be on oral anticoagulation not sure if he is taking, hyperlipidemia, chronic pain syndrome, adenocarcinoma of the pancreas status post surgery comes to the emergency room after patient was found to be confused at home. He is not been feeling well and sugars have been reading high at home.  1.  Diabetes mellitus uncontrolled-brittle diabetes with hyperglycemia and hypoglycemic episodes in the setting of type II diabetes with noncompliance with medications -patient has not been taking his long-acting insulin for last one month It was discontinued by the patient initially subsequently primary care physician agreed with that per patient's report -NovoLog 4 units TID, sliding scale insulin -please prescribe Levemir at discharge -IV fluids -Consult diabetic coordinator and outpatient follow-up with endocrinology as recommended -Case management consulted for medication assistance  2. rapid a fib with RVR in the setting of  dehydration Improved with fluids -metoprolol 25 BID -continue eliquis--- care management to help with meds at home  3. Dehydration due to number one -IV fluids  4. Hypokalemia repleted, potassium 3.5 today  5. DVT prophylaxis subcu Lovenox    All the records are reviewed and case discussed with Care Management/Social Workerr. Management plans discussed with the patient, family and they are in agreement.  CODE STATUS: FC   TOTAL TIME TAKING CARE OF THIS PATIENT: 35 minutes.   POSSIBLE D/C IN 1-2 DAYS, DEPENDING ON CLINICAL CONDITION.  Note: This dictation was prepared with Dragon dictation along with smaller phrase technology. Any transcriptional errors that result from this process are unintentional.   Nicholes Mango M.D on 09/21/2018 at 4:18 PM  Between 7am to 6pm - Pager - [phone] After 6pm go to [email or web address] - password [email or web address] [place] [facility]  Office  [phone]  CC: Primary care physician; [facility], [place]

## 2018-09-21 NOTE — Progress Notes (Signed)
Family Meeting Note  Advance Directive:yes  Today a meeting took place with the Patient.    The following clinical team members were present during this meeting:MD  The following were discussed:Patient's diagnosis: Hyperglycemia followed by hypoglycemia with history of diabetes mellitus, noncompliance with medications, atrial fibrillation with RVR, dehydration, hypokalemia, treatment plan of care discussed in detail with the patient.  He verbalized understanding of the plan  patient's progosis: Unable to determine and Goals for treatment: Full Code,Fiancee [name] HCPOA   Additional follow-up to be provided: Hospitalist  Time spent during discussion:17 MIN  Nicholes Mango, MD

## 2018-09-22 LAB — GLUCOSE, CAPILLARY
GLUCOSE-CAPILLARY: 355 mg/dL — AB (ref 70–99)
GLUCOSE-CAPILLARY: 159 mg/dL — AB (ref 70–99)
Glucose-Capillary: 169 mg/dL — ABNORMAL HIGH (ref 70–99)
Glucose-Capillary: 293 mg/dL — ABNORMAL HIGH (ref 70–99)
Glucose-Capillary: 182 mg/dL — ABNORMAL HIGH (ref 70–99)
Glucose-Capillary: 458 mg/dL — ABNORMAL HIGH (ref 70–99)
Glucose-Capillary: 119 mg/dL — ABNORMAL HIGH (ref 70–99)

## 2018-09-22 LAB — GLUCOSE, RANDOM: Glucose, Bld: 487 mg/dL — ABNORMAL HIGH (ref 70–99)

## 2018-09-22 MED ORDER — INSULIN GLARGINE 100 UNIT/ML ~~LOC~~ SOLN
15.0000 [IU] | Freq: Every day | SUBCUTANEOUS | Status: DC
  Filled 2018-09-22 (×2): qty 0.15

## 2018-09-22 MED ORDER — INSULIN GLARGINE 100 UNIT/ML ~~LOC~~ SOLN
15.0000 [IU] | Freq: Every day | SUBCUTANEOUS | Status: DC
  Filled 2018-09-22: qty 0.15

## 2018-09-22 NOTE — Progress Notes (Signed)
Inpatient Diabetes Program Recommendations  AACE/ADA: New Consensus Statement on Inpatient Glycemic Control (2019)  Target Ranges:  Prepandial:   less than 140 mg/dL      Peak postprandial:   less than 180 mg/dL (1-2 hours)      Critically ill patients:  140 - 180 mg/dL  Results for Richard [name], Richard [name] (MRN [id number]) as of 09/22/2018 08:26  Ref. Range 09/21/2018 08:04 09/21/2018 11:46 09/21/2018 12:02 09/21/2018 16:54 09/21/2018 20:59  Glucose-Capillary Latest Ref Range: 70 - 99 mg/dL 182 (H)  Novolog 11 units 69 (L) 94 318 (H)  Novolog 15 units 192 (H)  Results for Richard [name], Richard [name] (MRN [id number]) as of 09/22/2018 14:05  Ref. Range 08/03/2018 11:19  Hemoglobin A1C Latest Ref Range: 4.8 - 5.6 % 13.6 (H)    Review of Glycemic Control  Diabetes history: DM2 Outpatient Diabetes medications: Lantus 30 units QHS, Novolog 0-30 units TID with meals Current orders for Inpatient glycemic control: Novolog 0-15 units TID with meals, Novolog 0-5 units QHS, Novolog 4 units TID with meals  Inpatient Diabetes Program Recommendations:  Insulin-Basal: Please consider ordering Lantus 15 units Q24H (based on 54 kg x 0.3 units). HgbA1C: A1C 13.6% on 08/03/18 indicating an average glucose of 344 mg/dl. Patient was seen by Inpatient Diabetes Coordinator on 08/04/18 during last hospital admission.   NOTE: Noted consult for Diabetes Coordinator. Chart reviewed. Patient was seen by Inpatient Diabetes Coordinator on 08/04/18 during last hospital admission. Per H&P, noted patient has not taken Lantus for 1 month due to cost. Patient is followed by [facility] and per H&P, patient's insurance covers Levemir but not Lantus and patient will need Rx for Levemir insulin pens at time of discharge.  Noted glucose down to 69 mg/dl at 11:46 on 09/21/18 after getting Novolog 11 units with breakfast (meal coverage and correction). Patient did NOT get any Novolog meal coverage with lunch since pre-meal glucose 69 mg/dl. Glucose prior  to supper up to 318 mg/dlWill plan to follow up with patient today. Anticipate fluctuations in glucose due to no ordered basal insulin and not getting meal coverage with lunch yesterday.   Spoke with patient regarding DM control. Patient confirms that he goes to the [facility] and notes that last went to clinic about 1 month ago. Patient reports that he was given a prescription for Lantus (at last clinic visit) and when he took it to get it refilled, he was told that Lantus was not covered under his insurance. Therefore, he has not been taking any basal insulin for 1-2 months. Patient reports that he did not let the clinic know that the Lantus not covered. Patient reports that Levemir is the insulin that is covered under his current insurance. Patient states that when he was taking Lantus he was taking 30 units QHS and is also taking Novolog 0-30 units TID with meals (based on glucose). Patient reports that he needs Rx for Levemir insulin pens,  Novolog insulin (pens) and test strips at time of discharge. Patient states that his test strips copay is usually expensive as well. Informed patient he could purchase a Reli-On Prime glucometer at [facility] for $9 and a box of 50 test strips for $9 if his copay is too expensive for test strips for his current Accucheck glucometer. Discussed last A1C of 13.6% on 08/03/18 and explained that a current A1C may be even higher since he has been without basal insulin for over 1-2 months. Discussed A1C and glucose goals and stressed importance of glycemic control.  Asked patient to be sure to get prescriptions filled, monitor glucose 3-4 times per day, and to follow up with [facility]. Patient verbalized understanding of information and states that he has no questions at this time.  Thanks, Barnie Alderman, RN, MSN, CDE Diabetes Coordinator Inpatient Diabetes Program [phone] (Team Pager from 8am to 5pm)

## 2018-09-22 NOTE — Progress Notes (Signed)
[place] [facility] [place] NAME: Richard [name]    MR#:  [id number]  DATE OF BIRTH:  04-08-1956  SUBJECTIVE:  CHIEF COMPLAINT: Patient is resting comfortably.  Reports he stopped taking Eliquis as he was feeling better.  Could not afford Lantus Patient blood sugars are fluctuating a lot.  He is hyperglycemic and hypoglycemic REVIEW OF SYSTEMS:  CONSTITUTIONAL: No fever, fatigue or weakness.  EYES: No blurred or double vision.  EARS, NOSE, AND THROAT: No tinnitus or ear pain.  RESPIRATORY: No cough, shortness of breath, wheezing or hemoptysis.  CARDIOVASCULAR: No chest pain, orthopnea, edema.  GASTROINTESTINAL: No nausea, vomiting, diarrhea or abdominal pain.  GENITOURINARY: No dysuria, hematuria.  ENDOCRINE: No polyuria, nocturia,  HEMATOLOGY: No anemia, easy bruising or bleeding SKIN: No rash or lesion. MUSCULOSKELETAL: No joint pain or arthritis.   NEUROLOGIC: No tingling, numbness, weakness.  PSYCHIATRY: No anxiety or depression.   DRUG ALLERGIES:  No Known Allergies  VITALS:  Blood pressure (!) 150/86, pulse (!) 59, temperature 97.9 F (36.6 C), temperature source Oral, resp. rate 18, height 5\' 11"  (1.803 m), weight 54.9 kg, SpO2 100 %.  PHYSICAL EXAMINATION:  GENERAL:  62 y.o.-year-old patient lying in the bed with no acute distress.  EYES: Pupils equal, round, reactive to light and accommodation. No scleral icterus. Extraocular muscles intact.  HEENT: Head atraumatic, normocephalic. Oropharynx and nasopharynx clear.  NECK:  Supple, no jugular venous distention. No thyroid enlargement, no tenderness.  LUNGS: Normal breath sounds bilaterally, no wheezing, rales,rhonchi or crepitation. No use of accessory muscles of respiration.  CARDIOVASCULAR: S1, S2 normal. No murmurs, rubs, or gallops.  ABDOMEN: Soft, nontender, nondistended. Bowel sounds present.  EXTREMITIES: No pedal edema, cyanosis, or clubbing.  NEUROLOGIC: Cranial nerves  II through XII are intact. Muscle strength 5/5 in all extremities. Sensation intact. Gait not checked.  PSYCHIATRIC: The patient is alert and oriented x 3.  SKIN: No obvious rash, lesion, or ulcer.    LABORATORY PANEL:   CBC Recent Labs  Lab 09/20/18 1345  WBC 7.7  HGB 11.2*  HCT 33.9*  PLT 321   ------------------------------------------------------------------------------------------------------------------  Chemistries  Recent Labs  Lab 09/20/18 1345 09/21/18 0447 09/22/18 0909  NA 138 142  --   K 3.3* 3.5  --   CL 99 107  --   CO2 28 26  --   GLUCOSE 434* 181* 487*  BUN 6* 8  --   CREATININE 0.84 0.72  --   CALCIUM 9.1 8.6*  --   AST 22  --   --   ALT 16  --   --   ALKPHOS 26*  --   --   BILITOT 0.9  --   --    ------------------------------------------------------------------------------------------------------------------  Cardiac Enzymes No results for input(s): TROPONINI in the last 168 hours. ------------------------------------------------------------------------------------------------------------------  RADIOLOGY:  No results found.  EKG:   Orders placed or performed during the hospital encounter of 09/20/18  . ED EKG 12-Lead  . ED EKG 12-Lead  . EKG 12-Lead  . EKG 12-Lead  . ED EKG  . ED EKG  . EKG 12-Lead  . EKG 12-Lead  . EKG 12-Lead  . EKG 12-Lead  . EKG 12-Lead  . EKG 12-Lead  . EKG 12-Lead  . EKG 12-Lead  . EKG    ASSESSMENT AND PLAN:    Richard [name]  is a 63 y.o. male with a known history of type II diabetes on insulin, a fib supposed to be  on oral anticoagulation not sure if he is taking, hyperlipidemia, chronic pain syndrome, adenocarcinoma of the pancreas status post surgery comes to the emergency room after patient was found to be confused at home. He is not been feeling well and sugars have been reading high at home.  1.  Diabetes mellitus uncontrolled-brittle diabetes with hyperglycemia and hypoglycemic episodes in the  setting of type II diabetes with noncompliance with medications -patient has not been taking his long-acting insulin for last one month It was discontinued by the patient initially subsequently primary care physician agreed with that per patient's report -Start Lantus 15 units once daily and titrate as needed -NovoLog 4 units TID, sliding scale insulin - prescribe Levemir at discharge -IV fluids -diabetic coordinator is following   outpatient follow-up with endocrinology as recommended -Case management consulted for medication assistance  2. rapid a fib with RVR in the setting of dehydration Improved with fluids -metoprolol 25 BID -continue eliquis--- care management to help with meds at home  3. Dehydration due to number one -IV fluids  4. Hypokalemia repleted, potassium 3.5 today  5. DVT prophylaxis subcu Lovenox    All the records are reviewed and case discussed with Care Management/Social Workerr. Management plans discussed with the patient he is  in agreement.  CODE STATUS: FC   TOTAL TIME TAKING CARE OF THIS PATIENT: 35 minutes.   POSSIBLE D/C IN 1- DAYS, DEPENDING ON CLINICAL CONDITION.  Note: This dictation was prepared with Dragon dictation along with smaller phrase technology. Any transcriptional errors that result from this process are unintentional.   Nicholes Mango M.D on 09/22/2018 at 2:38 PM  Between 7am to 6pm - Pager - [phone] After 6pm go to [email or web address] - password [email or web address] [facility]  Office  [phone]  CC: Primary care physician; [facility] [place]

## 2018-09-22 NOTE — Progress Notes (Signed)
Patient blood sugar this am 487.  Dr. Margaretmary Eddy made aware.  Patient given 4 units novolog with meals as per orders and then an additional 8 units per MD.

## 2018-09-23 LAB — GLUCOSE, CAPILLARY
Glucose-Capillary: 388 mg/dL — ABNORMAL HIGH (ref 70–99)
Glucose-Capillary: 174 mg/dL — ABNORMAL HIGH (ref 70–99)
Glucose-Capillary: 83 mg/dL (ref 70–99)
Glucose-Capillary: 155 mg/dL — ABNORMAL HIGH (ref 70–99)
Glucose-Capillary: 142 mg/dL — ABNORMAL HIGH (ref 70–99)

## 2018-09-23 MED ORDER — INSULIN GLARGINE 100 UNIT/ML ~~LOC~~ SOLN
15.0000 [IU] | Freq: Every day | SUBCUTANEOUS | Status: DC
  Administered 2018-09-23: 15 [IU] via SUBCUTANEOUS
  Filled 2018-09-23 (×2): qty 0.15

## 2018-09-23 MED ORDER — ACCU-CHEK AVIVA PLUS W/DEVICE KIT: PACK | 1 refills | Status: DC

## 2018-09-23 MED ORDER — INSULIN ASPART 100 UNIT/ML ~~LOC~~ SOLN: 0.0000 [IU] | Freq: Every day | SUBCUTANEOUS | 11 refills | Status: DC

## 2018-09-23 MED ORDER — ACCU-CHEK SOFTCLIX LANCETS MISC: 0 refills | Status: DC

## 2018-09-23 MED ORDER — ACCU-CHEK AVIVA PLUS VI STRP: ORAL_STRIP | 0 refills | Status: DC

## 2018-09-23 MED ORDER — APIXABAN 5 MG PO TABS: 5.0000 mg | ORAL_TABLET | Freq: Two times a day (BID) | ORAL | 0 refills | Status: DC

## 2018-09-23 MED ORDER — INSULIN DETEMIR 100 UNIT/ML ~~LOC~~ SOLN: 25.0000 [IU] | Freq: Every day | SUBCUTANEOUS | 2 refills | Status: DC

## 2018-09-23 MED ORDER — NEEDLES & SYRINGES MISC: 3 refills | Status: DC

## 2018-09-23 MED ORDER — INSULIN ASPART 100 UNIT/ML ~~LOC~~ SOLN: 5.0000 [IU] | Freq: Three times a day (TID) | SUBCUTANEOUS | 1 refills | Status: DC

## 2018-09-23 MED ORDER — INSULIN ASPART 100 UNIT/ML ~~LOC~~ SOLN: 0.0000 [IU] | Freq: Three times a day (TID) | SUBCUTANEOUS | 11 refills | Status: DC

## 2018-09-23 NOTE — Progress Notes (Signed)
Inpatient Diabetes Program Recommendations  AACE/ADA: New Consensus Statement on Inpatient Glycemic Control (2015)  Target Ranges:  Prepandial:   less than 140 mg/dL      Peak postprandial:   less than 180 mg/dL (1-2 hours)      Critically ill patients:  140 - 180 mg/dL   Lab Results  Component Value Date   GLUCAP 174 (H) 09/23/2018   HGBA1C 13.6 (H) 08/03/2018    Review of Glycemic Control  Spoke with RN regarding orders for OP Diabetes Education. Pt is being discharged today. Will place orders for same.  Thank you. Lorenda Peck, RD, LDN, CDE Inpatient Diabetes Coordinator [phone]

## 2018-09-23 NOTE — Progress Notes (Signed)
Inpatient Diabetes Program Recommendations  AACE/ADA: New Consensus Statement on Inpatient Glycemic Control (2015)  Target Ranges:  Prepandial:   less than 140 mg/dL      Peak postprandial:   less than 180 mg/dL (1-2 hours)      Critically ill patients:  140 - 180 mg/dL   Lab Results  Component Value Date   GLUCAP 83 09/23/2018   HGBA1C 13.6 (H) 08/03/2018    Review of Glycemic Control  MD paged with question regarding sending pt home on Levemir 25 units QD. Pt's blood sugar at 1614 is 83. Pt feeling like he's hypoglycemic - likely from being used to running high blood sugars on a daily basis. (HgbA1C - 13.6%) MD to keep overnight and check morning blood sugar with Lantus 15 units on board. May need to decrease correction or meal coverage if HS CBG < 100 mg/dL. Also discussed with RN and returned page.  Thank you. Lorenda Peck, RD, LDN, CDE Inpatient Diabetes Coordinator [phone]

## 2018-09-23 NOTE — Progress Notes (Addendum)
Inpatient Diabetes Program Recommendations  AACE/ADA: New Consensus Statement on Inpatient Glycemic Control (2019)  Target Ranges:  Prepandial:   less than 140 mg/dL      Peak postprandial:   less than 180 mg/dL (1-2 hours)      Critically ill patients:  140 - 180 mg/dL   Results for [name], Richard [name] (MRN [id number]) as of 09/23/2018 09:24  Ref. Range 09/22/2018 08:40 09/22/2018 11:15 09/22/2018 16:53 09/22/2018 20:52 09/23/2018 09:00  Glucose-Capillary Latest Ref Range: 70 - 99 mg/dL 458 (H) 355 (H) 119 (H) 159 (H) 388 (H)   Review of Glycemic Control  Diabetes history: DM2 Outpatient Diabetes medications: Lantus 30 units QHS, Novolog 0-30 units TID with meals Current orders for Inpatient glycemic control: Lantus 15 units QHS (to start today at bedtime),Novolog 0-15 units TID with meals, Novolog 0-5 units QHS, Novolog 4 units TID with meals  Inpatient Diabetes Program Recommendations:  Insulin-Basal: Please consider changing frequency of Lantus 15 units to daily and start now. HgbA1C: A1C 13.6% on 08/03/18 indicating an average glucose of 344 mg/dl. Outpatient DM regimen: Recommend to discharge on Levemir 25 units daily, Novolog 5 units TID with meals for meal coverage, Novolog 0-15 units TID with meals for correction, and Novolog 0-5 units QHS for bedtime correction.  Patient needs to monitor glucose, take insulin as prescribed, and follow up with [name].  NOTE:  At time of discharge patient needs Rx for: Levemir insulin pens, Novolog insulin (pens) and test strips at time of discharge.  Thanks, Barnie Alderman, RN, MSN, CDE Diabetes Coordinator Inpatient Diabetes Program [phone] (Team Pager from 8am to 5pm)

## 2018-09-23 NOTE — Discharge Summary (Signed)
[place] [facility] [place] NAME: Richard [name]    MR#:  [id number]  DATE OF BIRTH:  [birth year]  DATE OF ADMISSION:  09/20/2018 ADMITTING PHYSICIAN: Fritzi Mandes, MD  DATE OF DISCHARGE:  09/23/18  PRIMARY CARE PHYSICIAN: [facility] [place]    ADMISSION DIAGNOSIS:  Dehydration [E86.0] Somnolence [R40.0] Hyperglycemia [R73.9] Atrial fibrillation with rapid ventricular response ([place]) [I48.91] Altered mental status, unspecified altered mental status type [R41.82]  DISCHARGE DIAGNOSIS:  Hyperglycemia Dehydration  SECONDARY DIAGNOSIS:   Past Medical History:  Diagnosis Date  . Atrial fibrillation ([place])    per patient  . Cancer ([place])   . CHF (congestive heart failure) ([place])   . Chronic back pain   . Chronic leg pain   . Coronary artery disease   . Diabetes mellitus without complication ([place])   . Hypertension   . Neuropathy     HOSPITAL COURSE:  HPI  Richard [name]  is a 62 y.o. male with a known history of type II diabetes on insulin, a fib supposed to be on oral anticoagulation not sure if he is taking, hyperlipidemia, chronic pain syndrome, adenocarcinoma of the pancreas status post surgery comes to the emergency room after patient was found to be confused at home. He is not been feeling well and sugars have been reading high at home.  He was found to have sugars in the 400s appears clinically dehydrated.  Discussing with patient's fianc it seems patient has not taken his long-acting insulin for one month. He was prescribed Lantus however fiance tells me that levemir is covered but they did not get it--?reason Patient went into rapid a fib and received 2.5 mg of IV metoprolol heart rate now is in the 80s. He is being admitted for hyperglycemia secondary to medication noncompliance, rapid a fib, dehydration.  Hospital course  1.  Diabetes mellitus uncontrolled-brittle diabetes withhyperglycemia and  hypoglycemic episodes in the setting of type II diabetes with noncompliance with medications -patient has not been taking his long-acting insulin for last one month It was discontinued by the patient initially subsequently primary care physician agreed with that per patient's report -Started Lantus 15 units once daily and monitor his sugars closely during the hospital course, which are somewhat better now - prescribing Levemir at discharge discharge patient with Levemir 25 units daily NovoLog 5 units 3 times daily with meals for meal coverage, NovoLog 0 to 15 units 3 times daily with meals for correction and NovoLog 0 to 5 units nightly at bedtime correction.  Recommended patient to monitor his glucose and be compliant with the insulin and follow-up with [facility] care physician [facility] -IV fluids given during the hospital course -diabetic coordinator is following   outpatient follow-up with endocrinology as recommended -Case management consulted for medication assistance  2.rapid a fib with RVR in the setting of dehydration Improved with fluids -metoprolol 25 BID -continue eliquis---care management to help with meds at home  3.Dehydration due to number one -IV fluids given Dehydration improved  4.Hypokalemia repleted, potassium 3.5 today  5.DVT prophylaxis subcu Lovenox   DISCHARGE CONDITIONS:   FAIR  CONSULTS OBTAINED:     PROCEDURES NONE  DRUG ALLERGIES:  No Known Allergies  DISCHARGE MEDICATIONS:   Allergies as of 09/23/2018   No Known Allergies     Medication List    STOP taking these medications   cyclobenzaprine 5 MG tablet Commonly known as:  FLEXERIL   GLOBAL EASE INJECT PEN  NEEDLES 31G X 5 MM Misc Generic drug:  Insulin Pen Needle   Insulin Glargine 100 UNIT/ML Solostar Pen Commonly known as:  LANTUS     TAKE these medications   ACCU-CHEK AVIVA PLUS test strip Generic drug:  glucose blood U ONCE TO BID UTD   ACCU-CHEK AVIVA  PLUS w/Device Kit U TO TEST ONCE TO BID   ACCU-CHEK SOFTCLIX LANCETS lancets TEST 1-2 XD   acetaminophen 325 MG tablet Commonly known as:  TYLENOL Take 1 tablet (325 mg total) by mouth every 6 (six) hours as needed for mild pain (or Fever >/= 101).   amitriptyline 50 MG tablet Commonly known as:  ELAVIL Take 1 tablet (50 mg total) by mouth at bedtime.   apixaban 5 MG Tabs tablet Commonly known as:  ELIQUIS Take 1 tablet (5 mg total) by mouth 2 (two) times daily.   aspirin 81 MG chewable tablet Chew 1 tablet (81 mg total) by mouth daily.   atorvastatin 40 MG tablet Commonly known as:  LIPITOR Take 1 tablet (40 mg total) by mouth every evening.   gabapentin 100 MG capsule Commonly known as:  NEURONTIN Take 1 capsule (100 mg total) by mouth 3 (three) times daily.   insulin aspart 100 UNIT/ML injection Commonly known as:  novoLOG Inject 0-15 Units into the skin 3 (three) times daily with meals. Correction coverage: Moderate (average weight, post-op) CBG < 70: implement hypoglycemia protocol CBG 70 - 120: 0 units CBG 121 - 150: 2 units CBG 151 - 200: 3 units CBG 201 - 250: 5 units CBG 251 - 300: 8 units CBG 301 - 350: 11 units CBG 351 - 400: 15 units CBG > 400: call MD What changed:  You were already taking a medication with the same name, and this prescription was added. Make sure you understand how and when to take each.   insulin aspart 100 UNIT/ML injection Commonly known as:  novoLOG Inject 0-5 Units into the skin at bedtime. CBG < 70: implement hypoglycemia protocol CBG 70 - 120: 0 units CBG 121 - 150: 0 units CBG 151 - 200: 0 units CBG 201 - 250: 2 units CBG 251 - 300: 3 units CBG 301 - 350: 4 units CBG 351 - 400: 5 units CBG > 400: call MD What changed:  You were already taking a medication with the same name, and this prescription was added. Make sure you understand how and when to take each.   insulin aspart 100 UNIT/ML injection Commonly known as:   novoLOG Inject 5 Units into the skin 3 (three) times daily before meals. May have insulin pens What changed:  how much to take   insulin detemir 100 UNIT/ML injection Commonly known as:  LEVEMIR Inject 0.25 mLs (25 Units total) into the skin daily.   metoprolol tartrate 25 MG tablet Commonly known as:  LOPRESSOR Take 1 tablet (25 mg total) by mouth 2 (two) times daily.   multivitamin with minerals Tabs tablet Take 1 tablet by mouth daily.   Needles & Syringes Misc Use as directed   protein supplement shake Liqd Commonly known as:  PREMIER PROTEIN Take 325 mLs (11 oz total) by mouth 2 (two) times daily between meals.   traZODone 50 MG tablet Commonly known as:  DESYREL Take 1 tablet (50 mg total) by mouth at bedtime as needed for sleep.        DISCHARGE INSTRUCTIONS:  Follow-up with primary care physician at [name] in 3 days Follow-up with  endocrinology in a week Outpatient lifestyle clinic in 3 days Follow-up with cardiology in 2  weeks  DIET:  Cardiac diet and Diabetic diet  DISCHARGE CONDITION:  Fair  ACTIVITY:  Activity as tolerated  OXYGEN:  Home Oxygen: No.   Oxygen Delivery: room air  DISCHARGE LOCATION:  home   If you experience worsening of your admission symptoms, develop shortness of breath, life threatening emergency, suicidal or homicidal thoughts you must seek medical attention immediately by calling 911 or calling your MD immediately  if symptoms less severe.  You Must read complete instructions/literature along with all the possible adverse reactions/side effects for all the Medicines you take and that have been prescribed to you. Take any new Medicines after you have completely understood and accpet all the possible adverse reactions/side effects.   Please note  You were cared for by a hospitalist during your hospital stay. If you have any questions about your discharge medications or the care you received while you were in the  hospital after you are discharged, you can call the unit and asked to speak with the hospitalist on call if the hospitalist that took care of you is not available. Once you are discharged, your primary care physician will handle any further medical issues. Please note that NO REFILLS for any discharge medications will be authorized once you are discharged, as it is imperative that you return to your primary care physician (or establish a relationship with a primary care physician if you do not have one) for your aftercare needs so that they can reassess your need for medications and monitor your lab values.     Today  Chief Complaint  Patient presents with  . Hyperglycemia   Patient is doing fine.  Denies any complaints.  Agreeable to be compliant with his insulin and other medications  ROS:  CONSTITUTIONAL: Denies fevers, chills. Denies any fatigue, weakness.  EYES: Denies blurry vision, double vision, eye pain. EARS, NOSE, THROAT: Denies tinnitus, ear pain, hearing loss. RESPIRATORY: Denies cough, wheeze, shortness of breath.  CARDIOVASCULAR: Denies chest pain, palpitations, edema.  GASTROINTESTINAL: Denies nausea, vomiting, diarrhea, abdominal pain. Denies bright red blood per rectum. GENITOURINARY: Denies dysuria, hematuria. ENDOCRINE: Denies nocturia or thyroid problems. HEMATOLOGIC AND LYMPHATIC: Denies easy bruising or bleeding. SKIN: Denies rash or lesion. MUSCULOSKELETAL: Denies pain in neck, back, shoulder, knees, hips or arthritic symptoms.  NEUROLOGIC: Denies paralysis, paresthesias.  PSYCHIATRIC: Denies anxiety or depressive symptoms.   VITAL SIGNS:  Blood pressure (!) 154/81, pulse 65, temperature 98.6 F (37 C), temperature source Oral, resp. rate 19, height 5' 11"  (1.803 m), weight 54.9 kg, SpO2 99 %.  I/O:    Intake/Output Summary (Last 24 hours) at 09/23/2018 1302 Last data filed at 09/23/2018 1200 Gross per 24 hour  Intake 720 ml  Output 2000 ml  Net -1280 ml     PHYSICAL EXAMINATION:  GENERAL:  62 y.o.-year-old patient lying in the bed with no acute distress.  EYES: Pupils equal, round, reactive to light and accommodation. No scleral icterus. Extraocular muscles intact.  HEENT: Head atraumatic, normocephalic. Oropharynx and nasopharynx clear.  NECK:  Supple, no jugular venous distention. No thyroid enlargement, no tenderness.  LUNGS: Normal breath sounds bilaterally, no wheezing, rales,rhonchi or crepitation. No use of accessory muscles of respiration.  CARDIOVASCULAR: S1, S2 normal. No murmurs, rubs, or gallops.  ABDOMEN: Soft, non-tender, non-distended. Bowel sounds present. No organomegaly or mass.  EXTREMITIES: No pedal edema, cyanosis, or clubbing.  NEUROLOGIC: Cranial nerves II through XII are  intact. Muscle strength 5/5 in all extremities. Sensation intact. Gait not checked.  PSYCHIATRIC: The patient is alert and oriented x 3.  SKIN: No obvious rash, lesion, or ulcer.   DATA REVIEW:   CBC Recent Labs  Lab 09/20/18 1345  WBC 7.7  HGB 11.2*  HCT 33.9*  PLT 321    Chemistries  Recent Labs  Lab 09/20/18 1345 09/21/18 0447 09/22/18 0909  NA 138 142  --   K 3.3* 3.5  --   CL 99 107  --   CO2 28 26  --   GLUCOSE 434* 181* 487*  BUN 6* 8  --   CREATININE 0.84 0.72  --   CALCIUM 9.1 8.6*  --   AST 22  --   --   ALT 16  --   --   ALKPHOS 26*  --   --   BILITOT 0.9  --   --     Cardiac Enzymes No results for input(s): TROPONINI in the last 168 hours.  Microbiology Results  Results for orders placed or performed during the hospital encounter of 08/03/18  Blood Culture (routine x 2)     Status: None   Collection Time: 08/03/18  7:47 AM  Result Value Ref Range Status   Specimen Description BLOOD L AC  Final   Special Requests   Final    BOTTLES DRAWN AEROBIC AND ANAEROBIC Blood Culture results may not be optimal due to an excessive volume of blood received in culture bottles   Culture   Final    NO GROWTH 5  DAYS Performed at [facility], [address] [place], [place] [address]    Report Status 08/08/2018 FINAL  Final  Blood Culture (routine x 2)     Status: None   Collection Time: 08/03/18  7:47 AM  Result Value Ref Range Status   Specimen Description BLOOD RFA  Final   Special Requests   Final    BOTTLES DRAWN AEROBIC AND ANAEROBIC Blood Culture results may not be optimal due to an excessive volume of blood received in culture bottles   Culture   Final    NO GROWTH 5 DAYS Performed at [facility], [address] [place], [place] [address]    Report Status 08/08/2018 FINAL  Final    RADIOLOGY:  Ct Head Wo Contrast  Result Date: 09/20/2018 CLINICAL DATA:  Hyperglycemia today.  Confusion. EXAM: CT HEAD WITHOUT CONTRAST TECHNIQUE: Contiguous axial images were obtained from the base of the skull through the vertex without intravenous contrast. COMPARISON:  None. FINDINGS: Brain: Ventricles, cisterns and other CSF spaces are within normal. There is mild chronic ischemic microvascular disease. Minimal right-sided basal ganglia calcifications. No evidence of focal mass, mass effect, shift of midline structures or acute hemorrhage. No evidence of acute infarction. Vascular: No hyperdense vessel or unexpected calcification. Skull: Normal. Negative for fracture or focal lesion. Sinuses/Orbits: No acute finding. Other: None. IMPRESSION: No acute findings. Minimal chronic ischemic microvascular disease. Electronically Signed   By: Marin Olp M.D.   On: 09/20/2018 14:12   Dg Chest Port 1 View  Result Date: 09/20/2018 CLINICAL DATA:  Hyperglycemia. EXAM: PORTABLE CHEST 1 VIEW COMPARISON:  Chest CT 08/03/2018 FINDINGS: Cardiomediastinal silhouette is normal. Mediastinal contours appear intact. Calcific atherosclerotic disease at the aortic arch. There is no evidence of focal airspace consolidation, pleural effusion or pneumothorax. Osseous structures are without acute  abnormality. Soft tissues are grossly normal. IMPRESSION: No active disease. Electronically Signed  By: Fidela Salisbury M.D.   On: 09/20/2018 14:26    EKG:   Orders placed or performed during the hospital encounter of 09/20/18  . ED EKG 12-Lead  . ED EKG 12-Lead  . EKG 12-Lead  . EKG 12-Lead  . ED EKG  . ED EKG  . EKG 12-Lead  . EKG 12-Lead  . EKG 12-Lead  . EKG 12-Lead  . EKG 12-Lead  . EKG 12-Lead  . EKG 12-Lead  . EKG 12-Lead  . EKG      Management plans discussed with the patient, family and they are in agreement.  CODE STATUS:     Code Status Orders  (From admission, onward)         Start     Ordered   09/20/18 1636  Full code  Continuous     09/20/18 1635        Code Status History    Date Active Date Inactive Code Status Order ID Comments User Context   08/03/2018 1020 08/04/2018 1813 Full Code [id number]  Gorden Harms, MD ED   01/09/2018 1025 01/11/2018 1932 Full Code [id number]  Harrie Foreman, MD Inpatient   09/05/2017 1559 09/11/2017 1928 Full Code [id number]  Fritzi Mandes, MD Inpatient   08/24/2017 0238 08/29/2017 2131 Full Code [id number]  Lance Coon, MD Inpatient   08/10/2015 0822 08/11/2015 1947 Full Code [id number]  Harrie Foreman, MD Inpatient      TOTAL TIME TAKING CARE OF THIS PATIENT: 43 minutes.   Note: This dictation was prepared with Dragon dictation along with smaller phrase technology. Any transcriptional errors that result from this process are unintentional.   @[facility] @  on 09/23/2018 at 1:02 PM  Between 7am to 6pm - Pager - [phone]  After 6pm go to [email or web address] - password [email or web address] [place] [facility]  Office  [phone]  CC: Primary care physician; [facility] [place]

## 2018-09-23 NOTE — Discharge Instructions (Signed)
Follow-up with primary care physician at [facility] clinic in 3 days Follow-up with endocrinology in a week Outpatient lifestyle clinic in 3 days Follow-up with cardiology in 2  weeks

## 2018-09-23 NOTE — Plan of Care (Signed)
Nutrition Education Note   RD consulted for nutrition education regarding diabetes.   62 y.o. male with a known history of type II diabetes on insulin, a fib supposed to be on oral anticoagulation not sure if he is taking, hyperlipidemia, chronic pain syndrome, adenocarcinoma of the pancreas status post surgery comes to the emergency room after patient was found to be confused at home.   Lab Results  Component Value Date   HGBA1C 13.6 (H) 08/03/2018    RD provided "Nutrition and Type II Diabetes" handout from the Academy of [employer] and Dietetics. Discussed different food groups and their effects on blood sugar, emphasizing carbohydrate-containing foods. Provided list of carbohydrates and recommended serving sizes of common foods.  Discussed importance of controlled and consistent carbohydrate intake throughout the day. Provided examples of ways to balance meals/snacks and encouraged intake of high-fiber, whole grain complex carbohydrates. Teach back method used.  Expect poor compliance.  Body mass index is 16.88 kg/m. Pt meets criteria underweight for based current BMI.  Current diet order is CHO controlled patient is consuming approximately 100% of meals at this time. Labs and medications reviewed. No further nutrition interventions warranted at this time. RD contact information provided. If additional nutrition issues arise, please re-consult RD.  Koleen Distance MS, RD, LDN Pager #- [phone] Office#- [phone] After Hours Pager: [phone]

## 2018-09-23 NOTE — Plan of Care (Signed)
  Problem: Clinical Measurements: Goal: Ability to maintain clinical measurements within normal limits will improve Outcome: Adequate for Discharge   Problem: Activity: Goal: Risk for activity intolerance will decrease Outcome: Adequate for Discharge   Problem: Health Behavior/Discharge Planning: Goal: Ability to safely manage health-related needs after discharge will improve Outcome: Adequate for Discharge

## 2018-09-23 NOTE — Progress Notes (Signed)
At bedside to discharge pt who complains of dizziness, VSS, CBG 83, offered pt some juice, and notified MD and diabetes coordinator. Pt will not discharge at this time. Will continue to monitor.

## 2018-09-23 NOTE — Progress Notes (Signed)
D/c cancelled 2/2 symptomatic hypoglycemia

## 2018-09-24 LAB — GLUCOSE, CAPILLARY
Glucose-Capillary: 316 mg/dL — ABNORMAL HIGH (ref 70–99)
Glucose-Capillary: 132 mg/dL — ABNORMAL HIGH (ref 70–99)

## 2018-09-24 MED ORDER — INSULIN DETEMIR 100 UNIT/ML ~~LOC~~ SOLN: 17.0000 [IU] | Freq: Every day | SUBCUTANEOUS | 1 refills | Status: DC

## 2018-09-24 MED ORDER — INSULIN DETEMIR 100 UNIT/ML ~~LOC~~ SOLN
17.0000 [IU] | Freq: Every day | SUBCUTANEOUS | Status: DC
  Administered 2018-09-24: 17 [IU] via SUBCUTANEOUS
  Filled 2018-09-24 (×2): qty 0.17

## 2018-09-24 NOTE — Progress Notes (Signed)
Pt discharged home today with fiance' VSS, IV removed, no complaints at this time blood sugars stable Patient and fiance' educated on sliding scale insulin and levemir dosage and usage, along with glucose monitoring and signs/symptoms of hyper/hypoglcemia.

## 2018-09-24 NOTE — Discharge Summary (Addendum)
[facility] [place] NAME: Richard [name]    MR#:  [id number]  DATE OF BIRTH:  26-Dec-1955  DATE OF ADMISSION:  09/20/2018 ADMITTING PHYSICIAN: Fritzi Mandes, MD  DATE OF DISCHARGE:  09/24/18  PRIMARY CARE PHYSICIAN: [facility] [place]    ADMISSION DIAGNOSIS:  Dehydration [E86.0] Somnolence [R40.0] Hyperglycemia [R73.9] Atrial fibrillation with rapid ventricular response ([place]) [I48.91] Altered mental status, unspecified altered mental status type [R41.82]  DISCHARGE DIAGNOSIS:  Hyperglycemia Dehydration  SECONDARY DIAGNOSIS:   Past Medical History:  Diagnosis Date  . Atrial fibrillation ([place])    per patient  . Cancer ([place])   . CHF (congestive heart failure) ([place])   . Chronic back pain   . Chronic leg pain   . Coronary artery disease   . Diabetes mellitus without complication ([place])   . Hypertension   . Neuropathy     HOSPITAL COURSE:  HPI  Richard [name]  is a 62 y.o. male with a known history of type II diabetes on insulin, a fib supposed to be on oral anticoagulation not sure if he is taking, hyperlipidemia, chronic pain syndrome, adenocarcinoma of the pancreas status post surgery comes to the emergency room after patient was found to be confused at home. He is not been feeling well and sugars have been reading high at home.  He was found to have sugars in the 400s appears clinically dehydrated.  Discussing with patient's fianc it seems patient has not taken his long-acting insulin for one month. He was prescribed Lantus however fiance tells me that levemir is covered but they did not get it--?reason Patient went into rapid a fib and received 2.5 mg of IV metoprolol heart rate now is in the 80s. He is being admitted for hyperglycemia secondary to medication noncompliance, rapid a fib, dehydration.  Hospital course  1.  Diabetes mellitus uncontrolled-brittle diabetes withhyperglycemia and  hypoglycemic episodes in the setting of type II diabetes with noncompliance with medications -patient has not been taking his long-acting insulin for last one month It was discontinued by the patient initially subsequently primary care physician agreed with that per patient's report -Started Lantus 15 units once daily and monitor his sugars closely during the hospital course, which are somewhat better now - prescribing Levemir at discharge discharge patient with Levemir 17units daily NovoLog 5 units 3 times daily with meals for meal coverage, NovoLog 0 to 15 units 3 times daily with meals for correction and NovoLog 0 to 5 units nightly at bedtime correction.  Recommended patient to monitor his glucose and be compliant with the insulin and follow-up with primary care physician at [facility] clinic -IV fluids given during the hospital course -diabetic coordinator is following   outpatient follow-up with endocrinology as recommended -Case management consulted for medication assistance  2.rapid a fib with RVR in the setting of dehydration Improved with fluids -metoprolol 25 BID -continue eliquis---care management to help with meds at home  3.Dehydration due to number one -IV fluids given Dehydration improved  4.Hypokalemia repleted, potassium 3.5 today  5.DVT prophylaxis subcu Lovenox   DISCHARGE CONDITIONS:   FAIR  CONSULTS OBTAINED:     PROCEDURES NONE  DRUG ALLERGIES:  No Known Allergies  DISCHARGE MEDICATIONS:   Allergies as of 09/24/2018   No Known Allergies     Medication List    STOP taking these medications   cyclobenzaprine 5 MG tablet Commonly known as:  FLEXERIL   GLOBAL EASE INJECT PEN NEEDLES  31G X 5 MM Misc Generic drug:  Insulin Pen Needle   Insulin Glargine 100 UNIT/ML Solostar Pen Commonly known as:  LANTUS     TAKE these medications   ACCU-CHEK AVIVA PLUS test strip Generic drug:  glucose blood U ONCE TO BID UTD   ACCU-CHEK AVIVA  PLUS w/Device Kit U TO TEST ONCE TO BID   ACCU-CHEK SOFTCLIX LANCETS lancets TEST 1-2 XD   acetaminophen 325 MG tablet Commonly known as:  TYLENOL Take 1 tablet (325 mg total) by mouth every 6 (six) hours as needed for mild pain (or Fever >/= 101).   amitriptyline 50 MG tablet Commonly known as:  ELAVIL Take 1 tablet (50 mg total) by mouth at bedtime.   apixaban 5 MG Tabs tablet Commonly known as:  ELIQUIS Take 1 tablet (5 mg total) by mouth 2 (two) times daily.   aspirin 81 MG chewable tablet Chew 1 tablet (81 mg total) by mouth daily.   atorvastatin 40 MG tablet Commonly known as:  LIPITOR Take 1 tablet (40 mg total) by mouth every evening.   gabapentin 100 MG capsule Commonly known as:  NEURONTIN Take 1 capsule (100 mg total) by mouth 3 (three) times daily.   insulin aspart 100 UNIT/ML injection Commonly known as:  novoLOG Inject 0-15 Units into the skin 3 (three) times daily with meals. Correction coverage: Moderate (average weight, post-op) CBG < 70: implement hypoglycemia protocol CBG 70 - 120: 0 units CBG 121 - 150: 2 units CBG 151 - 200: 3 units CBG 201 - 250: 5 units CBG 251 - 300: 8 units CBG 301 - 350: 11 units CBG 351 - 400: 15 units CBG > 400: call MD What changed:  You were already taking a medication with the same name, and this prescription was added. Make sure you understand how and when to take each.   insulin aspart 100 UNIT/ML injection Commonly known as:  novoLOG Inject 0-5 Units into the skin at bedtime. CBG < 70: implement hypoglycemia protocol CBG 70 - 120: 0 units CBG 121 - 150: 0 units CBG 151 - 200: 0 units CBG 201 - 250: 2 units CBG 251 - 300: 3 units CBG 301 - 350: 4 units CBG 351 - 400: 5 units CBG > 400: call MD What changed:  You were already taking a medication with the same name, and this prescription was added. Make sure you understand how and when to take each.   insulin aspart 100 UNIT/ML injection Commonly known as:   novoLOG Inject 5 Units into the skin 3 (three) times daily before meals. May have insulin pens What changed:  how much to take   insulin detemir 100 UNIT/ML injection Commonly known as:  LEVEMIR Inject 0.17 mLs (17 Units total) into the skin daily. Start taking on:  09/25/2018   metoprolol tartrate 25 MG tablet Commonly known as:  LOPRESSOR Take 1 tablet (25 mg total) by mouth 2 (two) times daily.   multivitamin with minerals Tabs tablet Take 1 tablet by mouth daily.   Needles & Syringes Misc Use as directed   protein supplement shake Liqd Commonly known as:  PREMIER PROTEIN Take 325 mLs (11 oz total) by mouth 2 (two) times daily between meals.   traZODone 50 MG tablet Commonly known as:  DESYREL Take 1 tablet (50 mg total) by mouth at bedtime as needed for sleep.        DISCHARGE INSTRUCTIONS:  Follow-up with primary care physician at [facility] clinic in  3 days Follow-up with endocrinology in a week Outpatient lifestyle clinic in 3 days Follow-up with cardiology in 2  weeks  DIET:  Cardiac diet and Diabetic diet  DISCHARGE CONDITION:  Fair  ACTIVITY:  Activity as tolerated  OXYGEN:  Home Oxygen: No.   Oxygen Delivery: room air  DISCHARGE LOCATION:  home   If you experience worsening of your admission symptoms, develop shortness of breath, life threatening emergency, suicidal or homicidal thoughts you must seek medical attention immediately by calling 911 or calling your MD immediately  if symptoms less severe.  You Must read complete instructions/literature along with all the possible adverse reactions/side effects for all the Medicines you take and that have been prescribed to you. Take any new Medicines after you have completely understood and accpet all the possible adverse reactions/side effects.   Please note  You were cared for by a hospitalist during your hospital stay. If you have any questions about your discharge medications or the care you  received while you were in the hospital after you are discharged, you can call the unit and asked to speak with the hospitalist on call if the hospitalist that took care of you is not available. Once you are discharged, your primary care physician will handle any further medical issues. Please note that NO REFILLS for any discharge medications will be authorized once you are discharged, as it is imperative that you return to your primary care physician (or establish a relationship with a primary care physician if you do not have one) for your aftercare needs so that they can reassess your need for medications and monitor your lab values.     Today  Chief Complaint  Patient presents with  . Hyperglycemia   Patient is doing fine.  Denies any complaints.  Agreeable to be compliant with his insulin and other medications  ROS:  CONSTITUTIONAL: Denies fevers, chills. Denies any fatigue, weakness.  EYES: Denies blurry vision, double vision, eye pain. EARS, NOSE, THROAT: Denies tinnitus, ear pain, hearing loss. RESPIRATORY: Denies cough, wheeze, shortness of breath.  CARDIOVASCULAR: Denies chest pain, palpitations, edema.  GASTROINTESTINAL: Denies nausea, vomiting, diarrhea, abdominal pain. Denies bright red blood per rectum. GENITOURINARY: Denies dysuria, hematuria. ENDOCRINE: Denies nocturia or thyroid problems. HEMATOLOGIC AND LYMPHATIC: Denies easy bruising or bleeding. SKIN: Denies rash or lesion. MUSCULOSKELETAL: Denies pain in neck, back, shoulder, knees, hips or arthritic symptoms.  NEUROLOGIC: Denies paralysis, paresthesias.  PSYCHIATRIC: Denies anxiety or depressive symptoms.   VITAL SIGNS:  Blood pressure (!) 150/75, pulse 68, temperature 99 F (37.2 C), temperature source Oral, resp. rate 15, height 5' 11"  (1.803 m), weight 54.9 kg, SpO2 100 %.  I/O:    Intake/Output Summary (Last 24 hours) at 09/24/2018 1406 Last data filed at 09/24/2018 1038 Gross per 24 hour  Intake 240 ml   Output 1800 ml  Net -1560 ml    PHYSICAL EXAMINATION:  GENERAL:  62 y.o.-year-old patient lying in the bed with no acute distress.  EYES: Pupils equal, round, reactive to light and accommodation. No scleral icterus. Extraocular muscles intact.  HEENT: Head atraumatic, normocephalic. Oropharynx and nasopharynx clear.  NECK:  Supple, no jugular venous distention. No thyroid enlargement, no tenderness.  LUNGS: Normal breath sounds bilaterally, no wheezing, rales,rhonchi or crepitation. No use of accessory muscles of respiration.  CARDIOVASCULAR: S1, S2 normal. No murmurs, rubs, or gallops.  ABDOMEN: Soft, non-tender, non-distended. Bowel sounds present. No organomegaly or mass.  EXTREMITIES: No pedal edema, cyanosis, or clubbing.  NEUROLOGIC: Cranial nerves  II through XII are intact. Muscle strength 5/5 in all extremities. Sensation intact. Gait not checked.  PSYCHIATRIC: The patient is alert and oriented x 3.  SKIN: No obvious rash, lesion, or ulcer.   DATA REVIEW:   CBC Recent Labs  Lab 09/20/18 1345  WBC 7.7  HGB 11.2*  HCT 33.9*  PLT 321    Chemistries  Recent Labs  Lab 09/20/18 1345 09/21/18 0447 09/22/18 0909  NA 138 142  --   K 3.3* 3.5  --   CL 99 107  --   CO2 28 26  --   GLUCOSE 434* 181* 487*  BUN 6* 8  --   CREATININE 0.84 0.72  --   CALCIUM 9.1 8.6*  --   AST 22  --   --   ALT 16  --   --   ALKPHOS 26*  --   --   BILITOT 0.9  --   --     Cardiac Enzymes No results for input(s): TROPONINI in the last 168 hours.  Microbiology Results  Results for orders placed or performed during the hospital encounter of 08/03/18  Blood Culture (routine x 2)     Status: None   Collection Time: 08/03/18  7:47 AM  Result Value Ref Range Status   Specimen Description BLOOD L AC  Final   Special Requests   Final    BOTTLES DRAWN AEROBIC AND ANAEROBIC Blood Culture results may not be optimal due to an excessive volume of blood received in culture bottles   Culture    Final    NO GROWTH 5 DAYS Performed at [facility], [address], [place], [place] [address]    Report Status 08/08/2018 FINAL  Final  Blood Culture (routine x 2)     Status: None   Collection Time: 08/03/18  7:47 AM  Result Value Ref Range Status   Specimen Description BLOOD RFA  Final   Special Requests   Final    BOTTLES DRAWN AEROBIC AND ANAEROBIC Blood Culture results may not be optimal due to an excessive volume of blood received in culture bottles   Culture   Final    NO GROWTH 5 DAYS Performed at [facility], [address], [place], [place] [address]    Report Status 08/08/2018 FINAL  Final    RADIOLOGY:  Ct Head Wo Contrast  Result Date: 09/20/2018 CLINICAL DATA:  Hyperglycemia today.  Confusion. EXAM: CT HEAD WITHOUT CONTRAST TECHNIQUE: Contiguous axial images were obtained from the base of the skull through the vertex without intravenous contrast. COMPARISON:  None. FINDINGS: Brain: Ventricles, cisterns and other CSF spaces are within normal. There is mild chronic ischemic microvascular disease. Minimal right-sided basal ganglia calcifications. No evidence of focal mass, mass effect, shift of midline structures or acute hemorrhage. No evidence of acute infarction. Vascular: No hyperdense vessel or unexpected calcification. Skull: Normal. Negative for fracture or focal lesion. Sinuses/Orbits: No acute finding. Other: None. IMPRESSION: No acute findings. Minimal chronic ischemic microvascular disease. Electronically Signed   By: Marin Olp M.D.   On: 09/20/2018 14:12   Dg Chest Port 1 View  Result Date: 09/20/2018 CLINICAL DATA:  Hyperglycemia. EXAM: PORTABLE CHEST 1 VIEW COMPARISON:  Chest CT 08/03/2018 FINDINGS: Cardiomediastinal silhouette is normal. Mediastinal contours appear intact. Calcific atherosclerotic disease at the aortic arch. There is no evidence of focal airspace consolidation, pleural effusion or pneumothorax. Osseous structures are  without acute abnormality. Soft tissues are grossly normal. IMPRESSION: No active disease.  Electronically Signed   By: Fidela Salisbury M.D.   On: 09/20/2018 14:26    EKG:   Orders placed or performed during the hospital encounter of 09/20/18  . ED EKG 12-Lead  . ED EKG 12-Lead  . EKG 12-Lead  . EKG 12-Lead  . ED EKG  . ED EKG  . EKG 12-Lead  . EKG 12-Lead  . EKG 12-Lead  . EKG 12-Lead  . EKG 12-Lead  . EKG 12-Lead  . EKG 12-Lead  . EKG 12-Lead  . EKG      Management plans discussed with the patient, family and they are in agreement.  CODE STATUS:     Code Status Orders  (From admission, onward)         Start     Ordered   09/20/18 1636  Full code  Continuous     09/20/18 1635        Code Status History    Date Active Date Inactive Code Status Order ID Comments User Context   08/03/2018 1020 08/04/2018 1813 Full Code [id number]  Gorden Harms, MD ED   01/09/2018 1025 01/11/2018 1932 Full Code [id number]  Harrie Foreman, MD Inpatient   09/05/2017 1559 09/11/2017 1928 Full Code [id number]  Fritzi Mandes, MD Inpatient   08/24/2017 0238 08/29/2017 2131 Full Code [id number]  Lance Coon, MD Inpatient   08/10/2015 0822 08/11/2015 1947 Full Code [id number]  Harrie Foreman, MD Inpatient      TOTAL TIME TAKING CARE OF THIS PATIENT: 43 minutes.   Note: This dictation was prepared with Dragon dictation along with smaller phrase technology. Any transcriptional errors that result from this process are unintentional.   @[facility] @  on 09/24/2018 at 2:06 PM  Between 7am to 6pm - Pager - [phone]  After 6pm go to [email or web address] - password [email or web address]  Office  [phone]  CC: Primary care physician; [facility] [place]

## 2018-09-24 NOTE — Care Management (Signed)
For discharge today. Informed during progression that there are no discharge needs. Patient has been given Eliquis coupon and attending has written prescription for levemir rather than lantus which is more expensive

## 2018-09-24 NOTE — Progress Notes (Signed)
Inpatient Diabetes Program Recommendations  AACE/ADA: New Consensus Statement on Inpatient Glycemic Control (2015)  Target Ranges:  Prepandial:   less than 140 mg/dL      Peak postprandial:   less than 180 mg/dL (1-2 hours)      Critically ill patients:  140 - 180 mg/dL   Results for Richard [name], Richard [name] (MRN [id number]) as of 09/24/2018 09:26  Ref. Range 09/23/2018 09:00 09/23/2018 12:08 09/23/2018 16:14 09/23/2018 17:15 09/23/2018 20:56 09/24/2018 07:39  Glucose-Capillary Latest Ref Range: 70 - 99 mg/dL 388 (H)  Novolog 19 units @9 :17  Lantus 15 units @ 10:51  174 (H)  Novolog 7 units @ 12:41 83 155 (H)  Novolog 7 units @ 17:47 142 (H) 316 (H)  Novolog 15 units@ 8:21   Review of Glycemic Control  Diabetes history:DM2 Outpatient Diabetes medications:Lantus30units QHS, Novolog 0-30units TID with meals Current orders for Inpatient glycemic control:Lantus 15 units QHS (to start today at bedtime),Novolog 0-15 units TID with meals, Novolog 0-5 units QHS, Novolog 4 units TID with meals  Inpatient Diabetes Program Recommendations: Insulin-Basal: Please consider increasing Lantus to 17 units to daily. HgbA1C: A1C 13.6% on 10/13/19indicating an average glucose of 344 mg/dl. Outpatient DM regimen: Recommend to discharge on Levemir 17 units daily, Novolog 5 units TID with meals for meal coverage, Novolog 0-15 units TID with meals for correction, and Novolog 0-5 units QHS for bedtime correction.  Patient needs to monitor glucose, take insulin as prescribed, and follow up with [name]. Outpatient DM education ordered on 09/23/18.  Discharge Needs:  At time of discharge patient needs Rx for: Levemir insulin pens, Novolog insulin (pens) and test strips at time of discharge.  NOTE: In reviewing chart, noted MD paged Diabetes Coordinator yesterday afternoon regarding patient feeling symptomatically hypoglycemia with glucose of 83 mg/dl @16 :14 on 09/23/18. Last A1C of 13.6% indicates an  average glucose of 344 mg/dl. Therefore, patient may feel symptomatic with normal glucose. If glycemic control is improved, patient's body will get use to normal glucose values. Noted patient received Novolog 19 units at 9:17 am and then Novolog 7 units at 12:41 on 09/23/18 (Novolog given within 3 1/2 hours of each other) which likely caused glucose to come down to 83 mg/dl. Fasting glucose 316 mg/dl today. Recommend patient be discharged on Levemir 17 units daily, Novolog 5 units TID with meals, plus Novolog correction scale, and asked to follow up at [facility] within the next week.  Thanks, Barnie Alderman, RN, MSN, CDE Diabetes Coordinator Inpatient Diabetes Program [phone] (Team Pager from 8am to 5pm)

## 2018-11-03 IMAGING — DX DG ANKLE 2V *L*
2 series · 2 of 2 positions shown · non-contrast
Comparison: None

CLINICAL DATA: LEFT knee and diffuse ankle pain for 1 week, denies
injury, history diabetes mellitus, hypertension, atrial
fibrillation, CHF

EXAM:
LEFT ANKLE - 2 VIEW

[ankle lat]
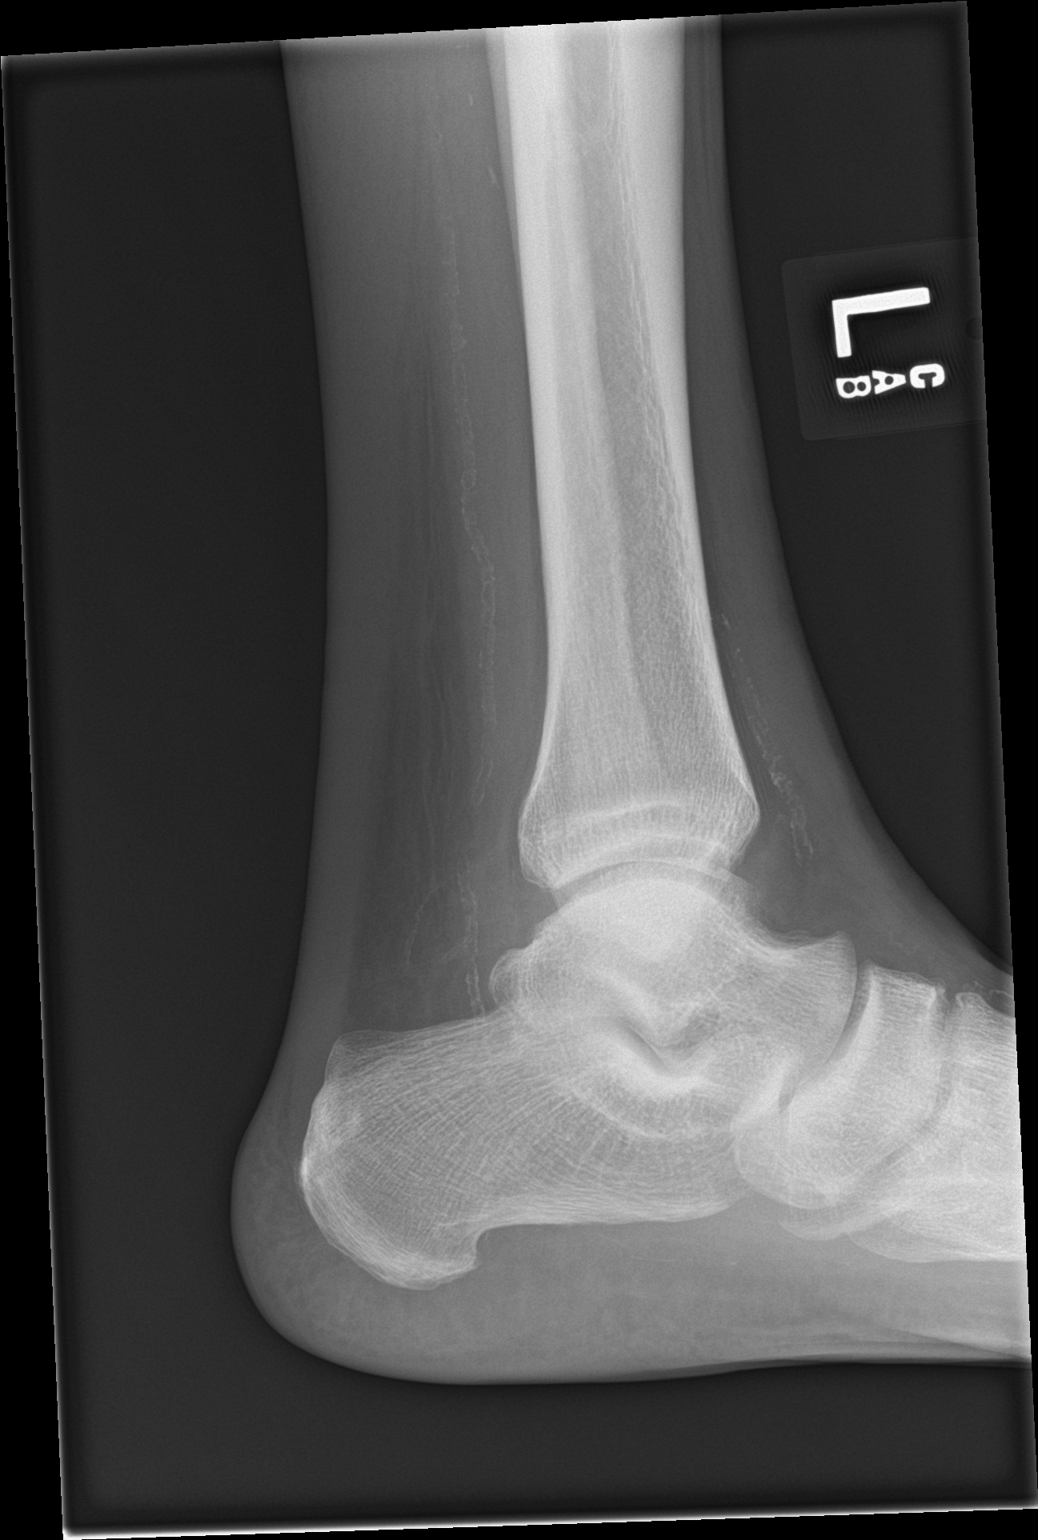

[ankle ap]
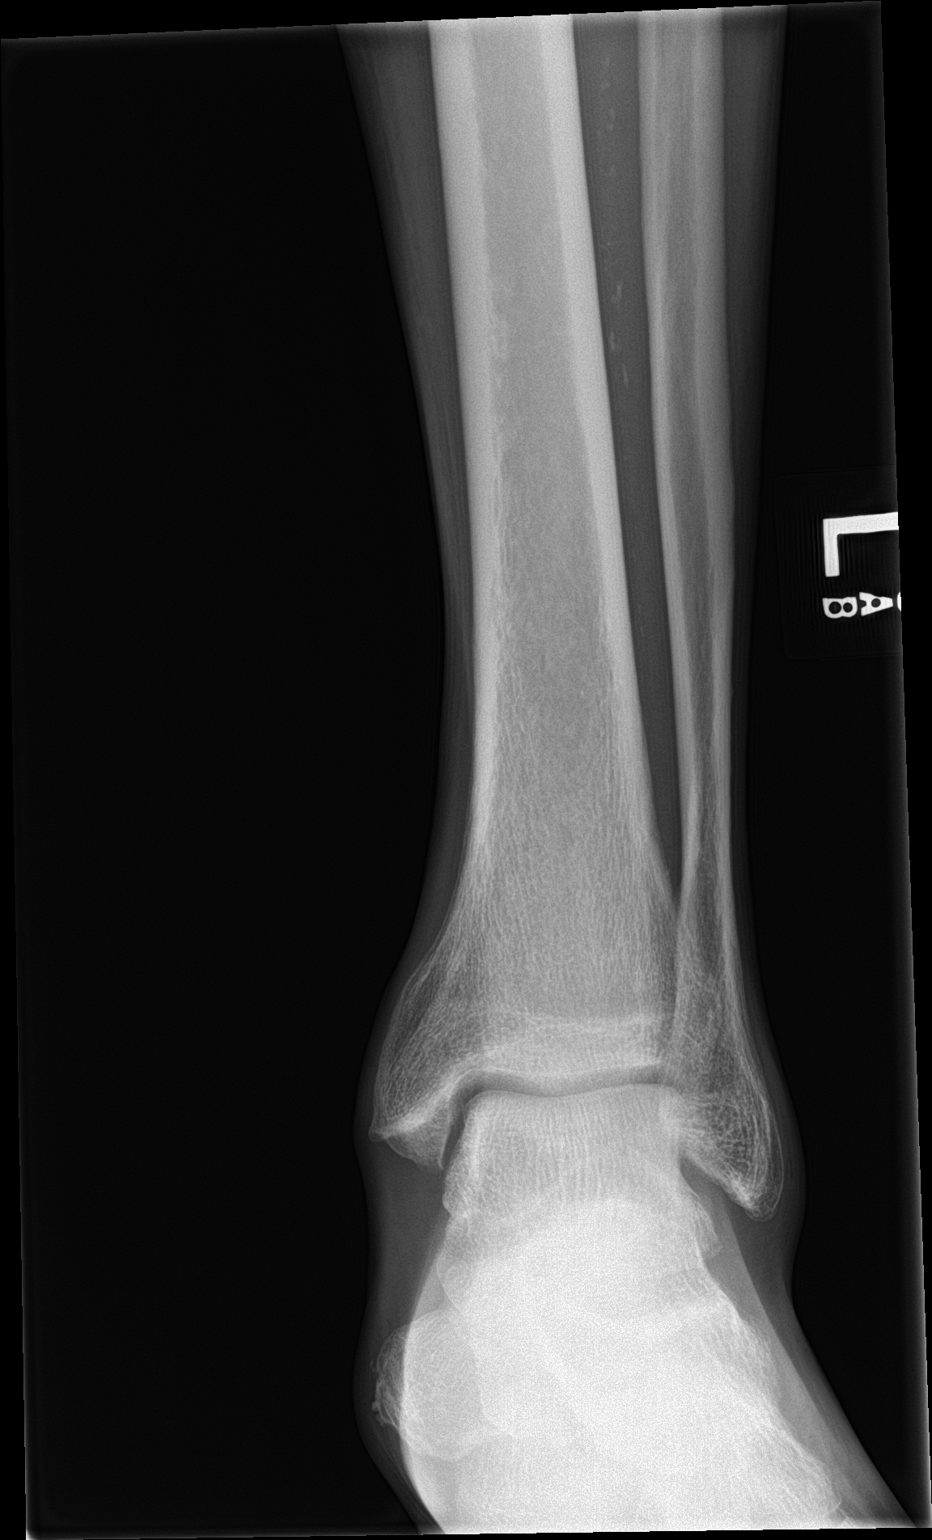

[2 of 2 positions shown; findings below may reference images not displayed]

FINDINGS: Osseous mineralization normal.

Joint spaces preserved.

Question mild anterior soft tissue swelling.

No acute fracture, dislocation, or bone destruction.

Small vessel atherosclerotic calcifications at lower leg and ankle
question related to diabetes mellitus.
IMPRESSION: No acute osseous abnormalities.

## 2018-11-11 ENCOUNTER — Inpatient Hospital Stay
Admission: EM | Admit: 2018-11-11 | Discharge: 2018-11-18 | DRG: 628 | Disposition: A | Discharge status: Home / Self Care | Payer: Medicare Other | Attending: Internal Medicine | Admitting: Internal Medicine

## 2018-11-11 ENCOUNTER — Other Ambulatory Visit: Payer: Self-pay

## 2018-11-11 ENCOUNTER — Emergency Department: Payer: Medicare Other

## 2018-11-11 DIAGNOSIS — M549 Dorsalgia, unspecified: Secondary | ICD-10-CM | POA: Diagnosis present

## 2018-11-11 DIAGNOSIS — G8929 Other chronic pain: Secondary | ICD-10-CM | POA: Diagnosis present

## 2018-11-11 DIAGNOSIS — I480 Paroxysmal atrial fibrillation: Secondary | ICD-10-CM | POA: Diagnosis present

## 2018-11-11 DIAGNOSIS — Z7982 Long term (current) use of aspirin: Secondary | ICD-10-CM

## 2018-11-11 DIAGNOSIS — Z681 Body mass index (BMI) 19 or less, adult: Secondary | ICD-10-CM

## 2018-11-11 DIAGNOSIS — Z89431 Acquired absence of right foot: Secondary | ICD-10-CM

## 2018-11-11 DIAGNOSIS — Z955 Presence of coronary angioplasty implant and graft: Secondary | ICD-10-CM

## 2018-11-11 DIAGNOSIS — R001 Bradycardia, unspecified: Secondary | ICD-10-CM | POA: Diagnosis present

## 2018-11-11 DIAGNOSIS — Z7901 Long term (current) use of anticoagulants: Secondary | ICD-10-CM

## 2018-11-11 DIAGNOSIS — I2510 Atherosclerotic heart disease of native coronary artery without angina pectoris: Secondary | ICD-10-CM | POA: Diagnosis present

## 2018-11-11 DIAGNOSIS — I509 Heart failure, unspecified: Secondary | ICD-10-CM | POA: Diagnosis present

## 2018-11-11 DIAGNOSIS — K30 Functional dyspepsia: Secondary | ICD-10-CM | POA: Diagnosis not present

## 2018-11-11 DIAGNOSIS — R7989 Other specified abnormal findings of blood chemistry: Secondary | ICD-10-CM

## 2018-11-11 DIAGNOSIS — E860 Dehydration: Secondary | ICD-10-CM | POA: Diagnosis present

## 2018-11-11 DIAGNOSIS — E871 Hypo-osmolality and hyponatremia: Secondary | ICD-10-CM | POA: Diagnosis present

## 2018-11-11 DIAGNOSIS — E872 Acidosis: Secondary | ICD-10-CM | POA: Diagnosis present

## 2018-11-11 DIAGNOSIS — E43 Unspecified severe protein-calorie malnutrition: Secondary | ICD-10-CM | POA: Diagnosis present

## 2018-11-11 DIAGNOSIS — Z89421 Acquired absence of other right toe(s): Secondary | ICD-10-CM | POA: Diagnosis not present

## 2018-11-11 DIAGNOSIS — I70212 Atherosclerosis of native arteries of extremities with intermittent claudication, left leg: Secondary | ICD-10-CM | POA: Diagnosis present

## 2018-11-11 DIAGNOSIS — G9341 Metabolic encephalopathy: Secondary | ICD-10-CM | POA: Diagnosis present

## 2018-11-11 DIAGNOSIS — E1101 Type 2 diabetes mellitus with hyperosmolarity with coma: Principal | ICD-10-CM

## 2018-11-11 DIAGNOSIS — I70222 Atherosclerosis of native arteries of extremities with rest pain, left leg: Secondary | ICD-10-CM | POA: Diagnosis not present

## 2018-11-11 DIAGNOSIS — Z90411 Acquired partial absence of pancreas: Secondary | ICD-10-CM

## 2018-11-11 DIAGNOSIS — E1151 Type 2 diabetes mellitus with diabetic peripheral angiopathy without gangrene: Secondary | ICD-10-CM | POA: Diagnosis present

## 2018-11-11 DIAGNOSIS — E1140 Type 2 diabetes mellitus with diabetic neuropathy, unspecified: Secondary | ICD-10-CM | POA: Diagnosis present

## 2018-11-11 DIAGNOSIS — Z8507 Personal history of malignant neoplasm of pancreas: Secondary | ICD-10-CM

## 2018-11-11 DIAGNOSIS — M79672 Pain in left foot: Secondary | ICD-10-CM | POA: Diagnosis not present

## 2018-11-11 DIAGNOSIS — Z833 Family history of diabetes mellitus: Secondary | ICD-10-CM

## 2018-11-11 DIAGNOSIS — I10 Essential (primary) hypertension: Secondary | ICD-10-CM | POA: Diagnosis not present

## 2018-11-11 DIAGNOSIS — R51 Headache: Secondary | ICD-10-CM | POA: Diagnosis not present

## 2018-11-11 DIAGNOSIS — Z66 Do not resuscitate: Secondary | ICD-10-CM | POA: Diagnosis present

## 2018-11-11 DIAGNOSIS — F17210 Nicotine dependence, cigarettes, uncomplicated: Secondary | ICD-10-CM | POA: Diagnosis present

## 2018-11-11 DIAGNOSIS — Z794 Long term (current) use of insulin: Secondary | ICD-10-CM

## 2018-11-11 DIAGNOSIS — I110 Hypertensive heart disease with heart failure: Secondary | ICD-10-CM | POA: Diagnosis present

## 2018-11-11 DIAGNOSIS — R4182 Altered mental status, unspecified: Secondary | ICD-10-CM

## 2018-11-11 DIAGNOSIS — R079 Chest pain, unspecified: Secondary | ICD-10-CM | POA: Diagnosis present

## 2018-11-11 DIAGNOSIS — R9431 Abnormal electrocardiogram [ECG] [EKG]: Secondary | ICD-10-CM | POA: Diagnosis not present

## 2018-11-11 DIAGNOSIS — I452 Bifascicular block: Secondary | ICD-10-CM | POA: Diagnosis present

## 2018-11-11 DIAGNOSIS — Z8249 Family history of ischemic heart disease and other diseases of the circulatory system: Secondary | ICD-10-CM

## 2018-11-11 LAB — TROPONIN I
Troponin I: <0.03 ng/mL (ref <0.03)
Troponin I: 0.03 ng/mL (ref <0.03)

## 2018-11-11 LAB — URINALYSIS, ROUTINE W REFLEX MICROSCOPIC
BILIRUBIN URINE: NEGATIVE
Bacteria, UA: NONE SEEN
Glucose, UA: >=500 mg/dL — AB
Hgb urine dipstick: NEGATIVE
Ketones, ur: NEGATIVE mg/dL
Leukocytes, UA: NEGATIVE
Nitrite: NEGATIVE
Protein, ur: NEGATIVE mg/dL
Specific Gravity, Urine: 1.026 (ref 1.005–1.030)
Squamous Epithelial / HPF: NONE SEEN (ref 0–5)
pH: 7 (ref 5.0–8.0)

## 2018-11-11 LAB — COMPREHENSIVE METABOLIC PANEL
ALT: 13 U/L (ref 0–44)
AST: 17 U/L (ref 15–41)
Albumin: 3.8 g/dL (ref 3.5–5.0)
Alkaline Phosphatase: 31 U/L — ABNORMAL LOW (ref 38–126)
Anion gap: 9 (ref 5–15)
BUN: 11 mg/dL (ref 8–23)
CO2: 26 mmol/L (ref 22–32)
Calcium: 9.5 mg/dL (ref 8.9–10.3)
Chloride: 94 mmol/L — ABNORMAL LOW (ref 98–111)
Creatinine, Ser: 1.04 mg/dL (ref 0.61–1.24)
GFR calc Af Amer: >60 mL/min (ref >60)
GFR calc non Af Amer: >60 mL/min (ref >60)
GLUCOSE: 755 mg/dL — AB (ref 70–99)
Potassium: 4.3 mmol/L (ref 3.5–5.1)
Sodium: 129 mmol/L — ABNORMAL LOW (ref 135–145)
Total Bilirubin: 0.7 mg/dL (ref 0.3–1.2)
Total Protein: 7.4 g/dL (ref 6.5–8.1)

## 2018-11-11 LAB — GLUCOSE, CAPILLARY
GLUCOSE-CAPILLARY: 100 mg/dL — AB (ref 70–99)
Glucose-Capillary: 429 mg/dL — ABNORMAL HIGH (ref 70–99)
Glucose-Capillary: 227 mg/dL — ABNORMAL HIGH (ref 70–99)
Glucose-Capillary: 281 mg/dL — ABNORMAL HIGH (ref 70–99)
Glucose-Capillary: 191 mg/dL — ABNORMAL HIGH (ref 70–99)
Glucose-Capillary: 206 mg/dL — ABNORMAL HIGH (ref 70–99)
Glucose-Capillary: 182 mg/dL — ABNORMAL HIGH (ref 70–99)
Glucose-Capillary: 107 mg/dL — ABNORMAL HIGH (ref 70–99)

## 2018-11-11 LAB — BASIC METABOLIC PANEL
Anion gap: 3 — ABNORMAL LOW (ref 5–15)
BUN: 8 mg/dL (ref 8–23)
CHLORIDE: 105 mmol/L (ref 98–111)
CO2: 26 mmol/L (ref 22–32)
CREATININE: 0.71 mg/dL (ref 0.61–1.24)
Calcium: 8.4 mg/dL — ABNORMAL LOW (ref 8.9–10.3)
GFR calc Af Amer: >60 mL/min (ref >60)
GFR calc non Af Amer: >60 mL/min (ref >60)
Glucose, Bld: 154 mg/dL — ABNORMAL HIGH (ref 70–99)
Potassium: 3.3 mmol/L — ABNORMAL LOW (ref 3.5–5.1)
Sodium: 134 mmol/L — ABNORMAL LOW (ref 135–145)

## 2018-11-11 LAB — URINE DRUG SCREEN, QUALITATIVE (ARMC ONLY)
Amphetamines, Ur Screen: NOT DETECTED
Barbiturates, Ur Screen: NOT DETECTED
Benzodiazepine, Ur Scrn: NOT DETECTED
Cannabinoid 50 Ng, Ur ~~LOC~~: POSITIVE — AB
Cocaine Metabolite,Ur ~~LOC~~: NOT DETECTED
MDMA (Ecstasy)Ur Screen: NOT DETECTED
Methadone Scn, Ur: NOT DETECTED
Opiate, Ur Screen: NOT DETECTED
Phencyclidine (PCP) Ur S: NOT DETECTED
Tricyclic, Ur Screen: NOT DETECTED

## 2018-11-11 LAB — CBC WITH DIFFERENTIAL/PLATELET
Abs Immature Granulocytes: 0.05 10*3/uL (ref 0.00–0.07)
Basophils Absolute: 0 10*3/uL (ref 0.0–0.1)
Basophils Relative: 0 %
Eosinophils Absolute: 0.3 10*3/uL (ref 0.0–0.5)
Eosinophils Relative: 4 %
HCT: 37.9 % — ABNORMAL LOW (ref 39.0–52.0)
HEMOGLOBIN: 12.4 g/dL — AB (ref 13.0–17.0)
Immature Granulocytes: 1 %
LYMPHS PCT: 39 %
Lymphs Abs: 2.7 10*3/uL (ref 0.7–4.0)
MCH: 29.6 pg (ref 26.0–34.0)
MCHC: 32.7 g/dL (ref 30.0–36.0)
MCV: 90.5 fL (ref 80.0–100.0)
Monocytes Absolute: 0.7 10*3/uL (ref 0.1–1.0)
Monocytes Relative: 10 %
NEUTROS ABS: 3.3 10*3/uL (ref 1.7–7.7)
NEUTROS PCT: 46 %
Platelets: 358 10*3/uL (ref 150–400)
RBC: 4.19 MIL/uL — ABNORMAL LOW (ref 4.22–5.81)
RDW: 13 % (ref 11.5–15.5)
WBC: 7.1 10*3/uL (ref 4.0–10.5)
nRBC: 0 % (ref 0.0–0.2)

## 2018-11-11 LAB — MRSA PCR SCREENING: MRSA by PCR: NEGATIVE

## 2018-11-11 LAB — PROTIME-INR
INR: 1.13
Prothrombin Time: 14.4 seconds (ref 11.4–15.2)

## 2018-11-11 LAB — BLOOD GAS, VENOUS
Acid-Base Excess: 1.3 mmol/L (ref 0.0–2.0)
BICARBONATE: 25 mmol/L (ref 20.0–28.0)
O2 Saturation: 38.3 %
Patient temperature: 37
pCO2, Ven: 36 mmHg — ABNORMAL LOW (ref 44.0–60.0)
pH, Ven: 7.45 — ABNORMAL HIGH (ref 7.250–7.430)

## 2018-11-11 LAB — APTT: aPTT: 31 seconds (ref 24–36)

## 2018-11-11 LAB — LACTIC ACID, PLASMA
Lactic Acid, Venous: 4.5 mmol/L (ref 0.5–1.9)
Lactic Acid, Venous: 5.4 mmol/L (ref 0.5–1.9)

## 2018-11-11 LAB — BETA-HYDROXYBUTYRIC ACID: Beta-Hydroxybutyric Acid: 0.11 mmol/L (ref 0.05–0.27)

## 2018-11-11 LAB — LIPASE, BLOOD: Lipase: 28 U/L (ref 11–51)

## 2018-11-11 LAB — MAGNESIUM: Magnesium: 1.9 mg/dL (ref 1.7–2.4)

## 2018-11-11 LAB — PROCALCITONIN: Procalcitonin: <0.1 ng/mL

## 2018-11-11 LAB — ETHANOL: Alcohol, Ethyl (B): <10 mg/dL (ref <10)

## 2018-11-11 MED ORDER — APIXABAN 5 MG PO TABS: 5.0000 mg | ORAL_TABLET | Freq: Two times a day (BID) | ORAL | Status: DC

## 2018-11-11 MED ORDER — NITROGLYCERIN 0.4 MG SL SUBL: 0.4000 mg | SUBLINGUAL_TABLET | SUBLINGUAL | Status: DC | PRN

## 2018-11-11 MED ORDER — ASPIRIN 300 MG RE SUPP: 300.0000 mg | Freq: Every day | RECTAL | Status: DC

## 2018-11-11 MED ORDER — LACTATED RINGERS IV SOLN
INTRAVENOUS | Status: DC
  Administered 2018-11-11 – 2018-11-12 (×3): via INTRAVENOUS

## 2018-11-11 MED ORDER — MORPHINE SULFATE (PF) 2 MG/ML IV SOLN
2.0000 mg | INTRAVENOUS | Status: DC | PRN
  Administered 2018-11-13 – 2018-11-16 (×4): 2 mg via INTRAVENOUS
  Filled 2018-11-11 (×5): qty 1

## 2018-11-11 MED ORDER — TRAZODONE HCL 50 MG PO TABS
50.0000 mg | ORAL_TABLET | Freq: Every evening | ORAL | Status: DC | PRN
  Administered 2018-11-12: 50 mg via ORAL
  Filled 2018-11-11: qty 1

## 2018-11-11 MED ORDER — INSULIN REGULAR(HUMAN) IN NACL 100-0.9 UT/100ML-% IV SOLN
INTRAVENOUS | Status: DC
  Administered 2018-11-11: 5.4 [IU]/h via INTRAVENOUS
  Filled 2018-11-11: qty 100

## 2018-11-11 MED ORDER — ATORVASTATIN CALCIUM 20 MG PO TABS
40.0000 mg | ORAL_TABLET | Freq: Every evening | ORAL | Status: DC
  Administered 2018-11-12 – 2018-11-17 (×7): 40 mg via ORAL
  Filled 2018-11-11 (×8): qty 2

## 2018-11-11 MED ORDER — HEPARIN (PORCINE) 25000 UT/250ML-% IV SOLN
1250.0000 [IU]/h | INTRAVENOUS | Status: DC
  Administered 2018-11-11: 700 [IU]/h via INTRAVENOUS
  Administered 2018-11-12: 1000 [IU]/h via INTRAVENOUS
  Administered 2018-11-13 – 2018-11-15 (×3): 1100 [IU]/h via INTRAVENOUS
  Filled 2018-11-11 (×6): qty 250

## 2018-11-11 MED ORDER — DEXMEDETOMIDINE HCL IN NACL 400 MCG/100ML IV SOLN
0.0000 ug/kg/h | INTRAVENOUS | Status: DC
  Administered 2018-11-11: 0.8 ug/kg/h via INTRAVENOUS
  Administered 2018-11-11: 0.6 ug/kg/h via INTRAVENOUS
  Filled 2018-11-11 (×2): qty 100

## 2018-11-11 MED ORDER — ADULT MULTIVITAMIN W/MINERALS CH
1.0000 | ORAL_TABLET | Freq: Every day | ORAL | Status: DC
  Administered 2018-11-12 – 2018-11-18 (×6): 1 via ORAL
  Filled 2018-11-11 (×6): qty 1

## 2018-11-11 MED ORDER — SODIUM CHLORIDE 0.9 % IV BOLUS
1000.0000 mL | Freq: Once | INTRAVENOUS | Status: AC
  Administered 2018-11-11: 1000 mL via INTRAVENOUS

## 2018-11-11 MED ORDER — DOPAMINE-DEXTROSE 3.2-5 MG/ML-% IV SOLN
0.0000 ug/kg/min | INTRAVENOUS | Status: DC
  Administered 2018-11-11: 5 ug/kg/min via INTRAVENOUS
  Filled 2018-11-11: qty 250

## 2018-11-11 MED ORDER — ACETAMINOPHEN 650 MG RE SUPP: 650.0000 mg | Freq: Four times a day (QID) | RECTAL | Status: DC | PRN

## 2018-11-11 MED ORDER — HEPARIN BOLUS VIA INFUSION
3300.0000 [IU] | Freq: Once | INTRAVENOUS | Status: AC
  Administered 2018-11-11: 3300 [IU] via INTRAVENOUS
  Filled 2018-11-11: qty 3300

## 2018-11-11 MED ORDER — ATROPINE SULFATE 1 MG/10ML IJ SOSY
1.0000 mg | PREFILLED_SYRINGE | Freq: Once | INTRAMUSCULAR | Status: AC
  Administered 2018-11-11: 1 mg via INTRAVENOUS
  Filled 2018-11-11: qty 10

## 2018-11-11 MED ORDER — INSULIN ASPART 100 UNIT/ML ~~LOC~~ SOLN
0.0000 [IU] | SUBCUTANEOUS | Status: DC
  Administered 2018-11-11: 5 [IU] via SUBCUTANEOUS
  Administered 2018-11-11 – 2018-11-12 (×3): 3 [IU] via SUBCUTANEOUS
  Filled 2018-11-11 (×4): qty 1

## 2018-11-11 MED ORDER — SODIUM CHLORIDE 0.9 % IV SOLN
INTRAVENOUS | Status: DC
  Administered 2018-11-11: 08:00:00 via INTRAVENOUS

## 2018-11-11 MED ORDER — POTASSIUM CHLORIDE 10 MEQ/100ML IV SOLN
10.0000 meq | INTRAVENOUS | Status: AC
  Administered 2018-11-11 (×2): 10 meq via INTRAVENOUS
  Filled 2018-11-11 (×2): qty 100

## 2018-11-11 MED ORDER — AMITRIPTYLINE HCL 50 MG PO TABS
50.0000 mg | ORAL_TABLET | Freq: Every day | ORAL | Status: DC
  Filled 2018-11-11: qty 1

## 2018-11-11 MED ORDER — ASPIRIN 81 MG PO CHEW
324.0000 mg | CHEWABLE_TABLET | Freq: Once | ORAL | Status: AC
  Administered 2018-11-11: 324 mg via ORAL
  Filled 2018-11-11: qty 4

## 2018-11-11 MED ORDER — METOPROLOL TARTRATE 25 MG PO TABS: 25.0000 mg | ORAL_TABLET | Freq: Two times a day (BID) | ORAL | Status: DC

## 2018-11-11 MED ORDER — ACETAMINOPHEN 325 MG PO TABS
650.0000 mg | ORAL_TABLET | Freq: Four times a day (QID) | ORAL | Status: DC | PRN
  Administered 2018-11-12: 650 mg via ORAL
  Filled 2018-11-11: qty 2

## 2018-11-11 NOTE — Progress Notes (Addendum)
Inpatient Diabetes Program Recommendations  AACE/ADA: New Consensus Statement on Inpatient Glycemic Control (2015)  Target Ranges:  Prepandial:   less than 140 mg/dL      Peak postprandial:   less than 180 mg/dL (1-2 hours)      Critically ill patients:  140 - 180 mg/dL   Results for Richard [name], Richard [name] (MRN [id number]) as of 11/11/2018 09:08  Ref. Range 11/11/2018 07:33 11/11/2018 08:32 11/11/2018 08:50  Glucose-Capillary Latest Ref Range: 70 - 99 mg/dL 429 (H) 281 (H) 227 (H)   Results for Richard [name], Richard [name] (MRN [id number]) as of 11/11/2018 09:08  Ref. Range 01/09/2018 08:28 08/03/2018 11:19  Hemoglobin A1C Latest Ref Range: 4.8 - 5.6 % 17.0 (H) 13.6 (H)    Admit with: AMS/ Diabetes mellitus with hyperosmolar coma--Glucose 755 mg/dl on admission  History: DM, CHF, Pancreatic Cancer post [facility]  Home DM Meds: Levemir 17 units Daily       Novolog 5 units TID with meals  Current Orders: IV Insulin Drip  PCP: [place]     IV Insulin Drip started at 6:25am today.  Counseled by the DM Coordinator on 01/10/2018 and 08/04/2018 during previous admissions.  A1c levels during both of those admissions were severely elevated.      MD- May consider rechecking current Hemoglobin A1c level.    --Will follow patient during hospitalization--  Wyn Quaker RN, MSN, CDE Diabetes Coordinator Inpatient Glycemic Control Team Team Pager: [phone] (8a-5p)

## 2018-11-11 NOTE — Progress Notes (Signed)
ANTICOAGULATION CONSULT NOTE - Initial Consult  Pharmacy Consult for Heparin  Indication: chest pain/ACS  No Known Allergies  Patient Measurements: Height: _0  (177.8 cm) Weight: 121 lb 0.5 oz (54.9 kg) IBW/kg (Calculated) : 73 Heparin Dosing Weight: 54.9 kg   Vital Signs: Temp: 97.7 F (36.5 C) (01/21 2000) Temp Source: Oral (01/21 2000) BP: 154/95 (01/21 1800) Pulse Rate: 64 (01/21 1800)  Labs: Recent Labs    11/11/18 0508  HGB 12.4*  HCT 37.9*  PLT 358  APTT 31  LABPROT 14.4  INR 1.13  CREATININE 1.04  TROPONINI <0.03    Estimated Creatinine Clearance: 56.5 mL/min (by C-G formula based on SCr of 1.04 mg/dL).   Medical History: Past Medical History:  Diagnosis Date  . Atrial fibrillation ([place])    per patient  . Cancer ([place])   . CHF (congestive heart failure) ([place])   . Chronic back pain   . Chronic leg pain   . Coronary artery disease   . Diabetes mellitus without complication ([place])   . Hypertension   . Neuropathy     Medications:  Medications Prior to Admission  Medication Sig Dispense Refill Last Dose  . ACCU-CHEK AVIVA PLUS test strip U ONCE TO BID UTD 100 each 0   . ACCU-CHEK SOFTCLIX LANCETS lancets TEST 1-2 XD 100 each 0   . acetaminophen (TYLENOL) 325 MG tablet Take 1 tablet (325 mg total) by mouth every 6 (six) hours as needed for mild pain (or Fever >/= 101). 30 tablet 0 prn at prn  . amitriptyline (ELAVIL) 50 MG tablet Take 1 tablet (50 mg total) by mouth at bedtime. 90 tablet 0 unknown at unknown  . apixaban (ELIQUIS) 5 MG TABS tablet Take 1 tablet (5 mg total) by mouth 2 (two) times daily. 60 tablet 0 unknown at unknown  . Blood Glucose Monitoring Suppl (ACCU-CHEK AVIVA PLUS) w/Device KIT U TO TEST ONCE TO BID 1 kit 1   . gabapentin (NEURONTIN) 100 MG capsule Take 1 capsule (100 mg total) by mouth 3 (three) times daily. 180 capsule 3 unknown at unknown  . insulin aspart (NOVOLOG) 100 UNIT/ML injection Inject 5 Units into the skin 3 (three)  times daily before meals. May have insulin pens 100 mL 1 unknown at unknown  . insulin detemir (LEVEMIR) 100 UNIT/ML injection Inject 0.17 mLs (17 Units total) into the skin daily. 100 mL 1 unknown at unknown  . Multiple Vitamin (MULTIVITAMIN WITH MINERALS) TABS tablet Take 1 tablet by mouth daily. 30 tablet 1 unknown at unknown  . Needles & Syringes MISC Use as directed 100 each 3   . traZODone (DESYREL) 50 MG tablet Take 1 tablet (50 mg total) by mouth at bedtime as needed for sleep. 90 tablet 0 unknown at unknown  . aspirin 81 MG chewable tablet Chew 1 tablet (81 mg total) by mouth daily. (Patient not taking: Reported on 11/11/2018) 30 tablet 0 Not Taking at Unknown time  . atorvastatin (LIPITOR) 40 MG tablet Take 1 tablet (40 mg total) by mouth every evening. (Patient not taking: Reported on 11/11/2018) 30 tablet 0 Not Taking at Unknown time  . insulin aspart (NOVOLOG) 100 UNIT/ML injection Inject 0-15 Units into the skin 3 (three) times daily with meals. Correction coverage: Moderate (average weight, post-op) CBG < 70: implement hypoglycemia protocol CBG 70 - 120: 0 units CBG 121 - 150: 2 units CBG 151 - 200: 3 units CBG 201 - 250: 5 units CBG 251 - 300: 8 units CBG 301 -  350: 11 units CBG 351 - 400: 15 units CBG > 400: call MD (Patient not taking: Reported on 11/11/2018) 10 mL 11 Not Taking at Unknown time  . insulin aspart (NOVOLOG) 100 UNIT/ML injection Inject 0-5 Units into the skin at bedtime. CBG < 70: implement hypoglycemia protocol CBG 70 - 120: 0 units CBG 121 - 150: 0 units CBG 151 - 200: 0 units CBG 201 - 250: 2 units CBG 251 - 300: 3 units CBG 301 - 350: 4 units CBG 351 - 400: 5 units CBG > 400: call MD (Patient not taking: Reported on 11/11/2018) 10 mL 11 Not Taking at Unknown time  . metoprolol tartrate (LOPRESSOR) 25 MG tablet Take 1 tablet (25 mg total) by mouth 2 (two) times daily. (Patient not taking: Reported on 11/11/2018) 60 tablet 0 Not Taking at Unknown time     Assessment: Pharmacy consulted to dose heparin in this 63 year old male with ACS/NSTEMI. CrCl = 56.6 ml/min,  No prior anticoag noted.   Goal of Therapy:  Heparin level 0.3-0.7 units/ml Monitor platelets by anticoagulation protocol: Yes   Plan:  Give 3300 units bolus x 1 Start heparin infusion at 700 units/hr Check anti-Xa level in 6 hours and daily while on heparin Continue to monitor H&H and platelets  Jaymien Landin D 11/11/2018,9:27 PM

## 2018-11-11 NOTE — Progress Notes (Signed)
Name: Richard [name] MRN: [id number] DOB: Dec 02, 1955     CONSULTATION DATE: 11/11/2018  REFERRING MD :  Loletha Grayer, MD  CHIEF COMPLAINT:  Altered mental status  SIGNIFICANT EVENTS/STUDIES:  1/21 - Arrived at ED, AMS 1/21 - Obtained CT head w/o contrast, Chest X-ray, CBG 755, lactic acid 4.5 1/21 - Transferred to ICU Stepdown 1/21 - CBG trends down to 206, lactic acid trends up to 5.4  HISTORY OF PRESENT ILLNESS:  Patient is a 63 y.o. 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 Per ED note, pt was unable to provide history due to AMS. Labs show Sodium 129, CBG 755 and lactic acid 4.5. Head CT w/o contrast and chest X-ray show no acute abnormalities. Pt was started on IV fluid. Pt is subsequently transferred to ICU Stepdown. Pt remains AMS and agitated, unable to provide history and responds to questions. CBG trends down to 206, lactic acid trends up to 5.4. Precedex given for agitation, pt is currently asleep and calm.   PAST MEDICAL HISTORY :   has a past medical history of Atrial fibrillation ([place]), Cancer ([place]), CHF (congestive heart failure) ([place]), Chronic back pain, Chronic leg pain, Coronary artery disease, Diabetes mellitus without complication ([place]), Hypertension, and Neuropathy.  has a past surgical history that includes Coronary angioplasty with stent; Esophagogastroduodenoscopy (N/A, 09/11/2017); and pancreas removed. Prior to Admission medications   Medication Sig Start Date End Date Taking? Authorizing Provider  ACCU-CHEK AVIVA PLUS test strip U ONCE TO BID UTD 09/23/18  Yes Gouru, Aruna, MD  ACCU-CHEK SOFTCLIX LANCETS lancets TEST 1-2 XD 09/23/18  Yes Gouru, Aruna, MD  acetaminophen (TYLENOL) 325 MG tablet Take 1 tablet (325 mg total) by mouth every 6 (six) hours as needed for mild pain  (or Fever >/= 101). 01/11/18  Yes Gouru, Aruna, MD  amitriptyline (ELAVIL) 50 MG tablet Take 1 tablet (50 mg total) by mouth at bedtime. 08/04/18  Yes Salary, Avel Peace, MD  apixaban (ELIQUIS) 5 MG TABS tablet Take 1 tablet (5 mg total) by mouth 2 (two) times daily. 09/23/18  Yes Gouru, Illene Silver, MD  Blood Glucose Monitoring Suppl (ACCU-CHEK AVIVA PLUS) w/Device KIT U TO TEST ONCE TO BID 09/23/18  Yes Gouru, Aruna, MD  gabapentin (NEURONTIN) 100 MG capsule Take 1 capsule (100 mg total) by mouth 3 (three) times daily. 08/04/18  Yes Salary, Holly Bodily D, MD  insulin aspart (NOVOLOG) 100 UNIT/ML injection Inject 5 Units into the skin 3 (three) times daily before meals. May have insulin pens 09/23/18  Yes Gouru, Aruna, MD  insulin detemir (LEVEMIR) 100 UNIT/ML injection Inject 0.17 mLs (17 Units total) into the skin daily. 09/25/18  Yes Gouru, Illene Silver, MD  Multiple Vitamin (MULTIVITAMIN WITH MINERALS) TABS tablet Take 1 tablet by mouth daily. 01/12/18  Yes Nicholes Mango, MD  Needles & Syringes MISC Use as directed 09/23/18  Yes Gouru, Aruna, MD  traZODone (DESYREL) 50 MG tablet Take 1 tablet (50 mg total) by mouth at bedtime as needed for sleep. 08/04/18  Yes Salary, Avel Peace, MD  aspirin 81 MG chewable tablet Chew 1 tablet (81 mg total) by mouth daily. Patient not taking: Reported on 11/11/2018 01/12/18   Nicholes Mango, MD  atorvastatin (LIPITOR) 40 MG tablet Take 1 tablet (40 mg total) by mouth every evening. Patient not taking: Reported on 11/11/2018 01/11/18   Nicholes Mango, MD  insulin aspart (  NOVOLOG) 100 UNIT/ML injection Inject 0-15 Units into the skin 3 (three) times daily with meals. Correction coverage: Moderate (average weight, post-op) CBG < 70: implement hypoglycemia protocol CBG 70 - 120: 0 units CBG 121 - 150: 2 units CBG 151 - 200: 3 units CBG 201 - 250: 5 units CBG 251 - 300: 8 units CBG 301 - 350: 11 units CBG 351 - 400: 15 units CBG > 400: call MD Patient not taking: Reported on 11/11/2018 09/23/18    Nicholes Mango, MD  insulin aspart (NOVOLOG) 100 UNIT/ML injection Inject 0-5 Units into the skin at bedtime. CBG < 70: implement hypoglycemia protocol CBG 70 - 120: 0 units CBG 121 - 150: 0 units CBG 151 - 200: 0 units CBG 201 - 250: 2 units CBG 251 - 300: 3 units CBG 301 - 350: 4 units CBG 351 - 400: 5 units CBG > 400: call MD Patient not taking: Reported on 11/11/2018 09/23/18   Nicholes Mango, MD  metoprolol tartrate (LOPRESSOR) 25 MG tablet Take 1 tablet (25 mg total) by mouth 2 (two) times daily. Patient not taking: Reported on 11/11/2018 01/11/18   Nicholes Mango, MD   No Known Allergies  FAMILY HISTORY:  family history includes CAD in his mother; Diabetes Mellitus II in his father and mother. SOCIAL HISTORY:  reports that he has been smoking. He has been smoking about 0.50 packs per day. He has never used smokeless tobacco. He reports current drug use. Drug: Cocaine. He reports that he does not drink alcohol.  REVIEW OF SYSTEMS:   Unable to obtain due to critical illness   VITAL SIGNS: Temp:  [97.5 F (36.4 C)-98.3 F (36.8 C)] 98.3 F (36.8 C) (01/21 0900) Pulse Rate:  [79-93] 87 (01/21 0900) Resp:  [22-33] 22 (01/21 0900) BP: (164-178)/(78-105) 164/96 (01/21 0736) SpO2:  [94 %-100 %] 100 % (01/21 0900) Weight:  [54.9 kg] 54.9 kg (01/21 0509)  Physical Examination:  GENERAL:Critically ill appearing, agitated (now asleep). HEAD: Normocephalic, atraumatic.  EYES: Pupils equal, round, reactive to light.  No scleral icterus. Sluggish reaction  MOUTH: Moist mucosal membrane. NECK: Supple. No JVD.  PULMONARY: Clear lung sound CARDIOVASCULAR: S1 and S2. Regular rate and rhythm. No murmurs, rubs, or gallops.  GASTROINTESTINAL: Soft, nontender, -distended. No masses. Positive bowel sounds. No hepatosplenomegaly.  MUSCULOSKELETAL: No swelling, clubbing, or edema.  NEUROLOGIC: Obtunded (now asleep)  SKIN: Intact,warm,dry  I personally reviewed lab work that was obtained in last  24 hrs.  CXR reviewed- No acute abnormalities Head CT w/o contrast reviewed- No acute abnormalities  ASSESSMENT / PLAN:  Patient is a 63 y.o. male presents to the ICU Stepdown with altered mental status and agitation on hyperglycemia and elevated lactic acid    Acute Encephalopathy, non-specific, with agitated confusion -Currently on Precedex -Start LR and reassess BMP, CBC in AM   Pseudohyponatremia due to Hyperosmolar coma -D/C'd normal saline -Start LR at 173m/hr -Reassess BMP, CBC in AM  Diabetes mellitus with hyperglycemic episode -D/C'd insulin infusion -On sliding-scale insulin -CBG trends down to 206, continue to monitor   Lactic acidosis -Lactic acid trends up to 5.4 -No sign of infection, likely due to hyperosmolar coma and   -Procalcitonin is negative, reassess in AM -Follow up 2/2 blood cultures  Pt is DO NOT RESUSCITATE, Stepdown status  Overall, patient is critically ill, prognosis is guarded.    Jo-ku TMervyn Skeeters PA-Student

## 2018-11-11 NOTE — ED Provider Notes (Signed)
[facility] Emergency Department Provider Note  ____________________________________________   First MD Initiated Contact with Patient 11/11/18 [phone]     (approximate)  I have reviewed the triage vital signs and the nursing notes.   HISTORY  Chief Complaint No chief complaint on file.   Level 5 caveat:  history/ROS limited by altered mental status/confusion and/or critical illness    HPI Richard [name] is a 63 y.o. male with extensive chronic medical history and relatively frequent emergency department visits.  He presents by EMS tonight for hyperglycemia and altered mental status as well as questionable weakness in his right hand and arm.  He is not able to give any significant history to me.  I asked him several times what brought him in and how he is doing and he is moaning and saying "doing bad" but will not or cannot give any specifics.  When I ask him about individual issues, he responds yes to everything.  For example, ask him if he had chest pain and he said yes.  I asked him if he was short of breath and he said yes.  I then asked him if his hair hurt and he said yes.  I believe that history is limited either due to critical illness, altered mental status, or lack of cooperation.  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  I am not able to ascertain the duration of his symptoms but they do seem to be severe.  Unclear if they were acute in onset.  Past Medical History:  Diagnosis Date  . Atrial fibrillation ([place])    per patient  . Cancer ([place])   . CHF (congestive heart failure) ([place])   . Chronic back pain   . Chronic leg pain   . Coronary artery disease   . Diabetes mellitus without complication ([place])   . Hypertension   . Neuropathy     Patient Active Problem List   Diagnosis Date Noted  . A-fib ([place])  09/20/2018  . Hyperglycemia 08/03/2018  . Protein-calorie malnutrition, severe 01/10/2018  . Sepsis ([place]) 01/09/2018  . DKA, type 2 ([place]) 09/05/2017  . Ileus ([place]) 08/24/2017  . Diabetes ([place]) 08/24/2017  . Atrial flutter ([place]) 08/24/2017  . CAD (coronary artery disease) 08/24/2017  . HTN (hypertension) 08/24/2017  . Atrial fibrillation ([place]) 08/24/2017  . Tobacco use disorder, severe, dependence 07/25/2017  . Wound dehiscence, surgical, initial encounter 06/24/2017  . Acute hematogenous osteomyelitis of right foot ([place]) 06/20/2017  . 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 Esophagitis 06/20/2017  . Cellulitis of right lower extremity 06/10/2017  . Hypertension, poor control 06/10/2017  . Insulin dependent type 2 diabetes mellitus ([place]) 06/10/2017  . Chest pain 08/10/2015    Past Surgical History:  Procedure Laterality Date  . CORONARY ANGIOPLASTY WITH STENT PLACEMENT    . ESOPHAGOGASTRODUODENOSCOPY N/A 09/11/2017   Procedure: ESOPHAGOGASTRODUODENOSCOPY (EGD);  Surgeon: Virgel Manifold, MD;  Location: [facility] ENDOSCOPY;  Service: Endoscopy;  Laterality: N/A;  . pancreas removed      Prior to Admission medications   Medication Sig Start Date End Date Taking? Authorizing Provider  ACCU-CHEK AVIVA PLUS test strip U ONCE TO BID UTD 09/23/18   Nicholes Mango, MD  ACCU-CHEK SOFTCLIX LANCETS lancets TEST 1-2 XD 09/23/18   Gouru, Illene Silver, MD  acetaminophen (TYLENOL) 325 MG tablet Take  1 tablet (325 mg total) by mouth every 6 (six) hours as needed for mild pain (or Fever >/= 101). 01/11/18   Gouru, Aruna, MD  amitriptyline (ELAVIL) 50 MG tablet Take 1 tablet (50 mg total) by mouth at bedtime. 08/04/18   Salary, Avel Peace, MD  apixaban (ELIQUIS) 5 MG TABS tablet Take 1 tablet (5 mg total) by mouth 2 (two) times daily. 09/23/18   Nicholes Mango, MD  aspirin 81 MG chewable tablet Chew 1 tablet (81 mg total) by mouth daily. 01/12/18   Nicholes Mango, MD  atorvastatin (LIPITOR) 40 MG tablet Take  1 tablet (40 mg total) by mouth every evening. 01/11/18   Gouru, Aruna, MD  Blood Glucose Monitoring Suppl (ACCU-CHEK AVIVA PLUS) w/Device KIT U TO TEST ONCE TO BID 09/23/18   Gouru, Illene Silver, MD  gabapentin (NEURONTIN) 100 MG capsule Take 1 capsule (100 mg total) by mouth 3 (three) times daily. 08/04/18   Salary, Avel Peace, MD  insulin aspart (NOVOLOG) 100 UNIT/ML injection Inject 0-15 Units into the skin 3 (three) times daily with meals. Correction coverage: Moderate (average weight, post-op) CBG < 70: implement hypoglycemia protocol CBG 70 - 120: 0 units CBG 121 - 150: 2 units CBG 151 - 200: 3 units CBG 201 - 250: 5 units CBG 251 - 300: 8 units CBG 301 - 350: 11 units CBG 351 - 400: 15 units CBG > 400: call MD 09/23/18   Nicholes Mango, MD  insulin aspart (NOVOLOG) 100 UNIT/ML injection Inject 0-5 Units into the skin at bedtime. CBG < 70: implement hypoglycemia protocol CBG 70 - 120: 0 units CBG 121 - 150: 0 units CBG 151 - 200: 0 units CBG 201 - 250: 2 units CBG 251 - 300: 3 units CBG 301 - 350: 4 units CBG 351 - 400: 5 units CBG > 400: call MD 09/23/18   Nicholes Mango, MD  insulin aspart (NOVOLOG) 100 UNIT/ML injection Inject 5 Units into the skin 3 (three) times daily before meals. May have insulin pens 09/23/18   Gouru, Illene Silver, MD  insulin detemir (LEVEMIR) 100 UNIT/ML injection Inject 0.17 mLs (17 Units total) into the skin daily. 09/25/18   Nicholes Mango, MD  metoprolol tartrate (LOPRESSOR) 25 MG tablet Take 1 tablet (25 mg total) by mouth 2 (two) times daily. 01/11/18   Nicholes Mango, MD  Multiple Vitamin (MULTIVITAMIN WITH MINERALS) TABS tablet Take 1 tablet by mouth daily. 01/12/18   Nicholes Mango, MD  Needles & Syringes MISC Use as directed 09/23/18   Nicholes Mango, MD  protein supplement shake (PREMIER PROTEIN) LIQD Take 325 mLs (11 oz total) by mouth 2 (two) times daily between meals. Patient not taking: Reported on 09/20/2018 01/12/18   Nicholes Mango, MD  traZODone (DESYREL) 50 MG tablet Take  1 tablet (50 mg total) by mouth at bedtime as needed for sleep. 08/04/18   Salary, Avel Peace, MD    Allergies Patient has no known allergies.  Family History  Problem Relation Age of Onset  . CAD Mother   . Diabetes Mellitus II Father     Social History Social History   Tobacco Use  . Smoking status: Former Smoker    Packs/day: 0.50  . Smokeless tobacco: Never Used  Substance Use Topics  . Alcohol use: No    Alcohol/week: 0.0 standard drinks  . 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 Cocaine used over a year ago per pt.    Review  of Systems Level 5 caveat:  history/ROS limited by altered mental status/confusion and/or critical illness  The patient reports that everything hurts, that he is short of breath, and that he is "doing bad" but cannot give any additional information. ____________________________________________   PHYSICAL EXAM:  VITAL SIGNS: ED Triage Vitals  Enc Vitals Group     BP 11/11/18 0507 (!) 178/105     Pulse Rate 11/11/18 0507 88     Resp 11/11/18 0507 (!) 33     Temp 11/11/18 0507 (!) 97.5 F (36.4 C)     Temp Source 11/11/18 0507 Oral     SpO2 11/11/18 0507 94 %     Weight 11/11/18 0509 54.9 kg (121 lb 0.5 oz)     Height 11/11/18 0509 1.829 m (6')     Head Circumference --      Peak Flow --      Pain Score --      Pain Loc --      Pain Edu? --      Excl. in [place]? --     Constitutional: Awake and appears to be uncomfortable, altered mental status, no respiratory distress but ill-appearing. Eyes: Conjunctivae are normal.  Head: Atraumatic. Nose: No congestion/rhinnorhea. Mouth/Throat: Mucous membranes are very dry and his lips are cracked. Neck: No stridor.  No meningeal signs.   Cardiovascular: Normal rate, regular rhythm. Good peripheral circulation. Grossly normal heart sounds. Respiratory: Normal respiratory effort.  No retractions. Lungs CTAB. Gastrointestinal: Soft and nontender. No distention.    Musculoskeletal: No lower extremity tenderness nor edema. No gross deformities of extremities. Neurologic: The patient's speech is a little bit difficult to interpret and he is altered and thus not able to participate in a neurological exam.  However he is moving all 4 extremities and seems to have good strength in each extremity. Skin:  Skin is warm, dry and intact. No rash noted.   ____________________________________________   LABS (all labs ordered are listed, but only abnormal results are displayed)  Labs Reviewed  COMPREHENSIVE METABOLIC PANEL - Abnormal; Notable for the following components:      Result Value   Sodium 129 (*)    Chloride 94 (*)    Glucose, Bld 755 (*)    Alkaline Phosphatase 31 (*)    All other components within normal limits  LACTIC ACID, PLASMA - Abnormal; Notable for the following components:   Lactic Acid, Venous 4.5 (*)    All other components within normal limits  CBC WITH DIFFERENTIAL/PLATELET - Abnormal; Notable for the following components:   RBC 4.19 (*)    Hemoglobin 12.4 (*)    HCT 37.9 (*)    All other components within normal limits  URINE DRUG SCREEN, QUALITATIVE ([facility] ONLY) - Abnormal; Notable for the following components:   Cannabinoid 50 Ng, Ur [place] POSITIVE (*)    All other components within normal limits  URINALYSIS, ROUTINE W REFLEX MICROSCOPIC - Abnormal; Notable for the following components:   Color, Urine COLORLESS (*)    APPearance CLEAR (*)    Glucose, UA >=500 (*)    All other components within normal limits  BLOOD GAS, VENOUS - Abnormal; Notable for the following components:   pH, Ven 7.45 (*)    pCO2, Ven 36 (*)    pO2, Ven <31.0 (*)    All other components within normal limits  GLUCOSE, CAPILLARY - Abnormal; Notable for the following components:   Glucose-Capillary 429 (*)    All other components within normal limits  ETHANOL  LIPASE, BLOOD  BETA-HYDROXYBUTYRIC ACID  PROTIME-INR  APTT  TROPONIN I  LACTIC ACID,  PLASMA   ____________________________________________  EKG  ED ECG REPORT I, Hinda Kehr, the attending physician, personally viewed and interpreted this ECG.  Date: 11/11/2018 EKG Time: 6:59 AM Rate: 85 Rhythm: normal sinus rhythm QRS Axis: normal Intervals: Incomplete right bundle branch block and left anterior fascicular block. ST/T Wave abnormalities: Non-specific ST segment / T-wave changes, but no clear evidence of acute ischemia. Narrative Interpretation: no definitive evidence of acute ischemia; does not meet STEMI criteria.   ____________________________________________  RADIOLOGY I, Hinda Kehr, personally viewed and evaluated these images (plain radiographs) as part of my medical decision making, as well as reviewing the written report by the radiologist.  ED MD interpretation:  No acute abnormality on CXR  Official radiology report(s): Ct Head Wo Contrast  Result Date: 11/11/2018 CLINICAL DATA:  Altered level of consciousness EXAM: CT HEAD WITHOUT CONTRAST TECHNIQUE: Contiguous axial images were obtained from the base of the skull through the vertex without intravenous contrast. COMPARISON:  September 20, 2018 FINDINGS: Brain: No evidence of acute infarction, hemorrhage, hydrocephalus, extra-axial collection or mass lesion/mass effect. There is chronic diffuse atrophy. Chronic bilateral periventricular white matter small vessel ischemic changes noted. Vascular: No hyperdense vessel is noted. Skull: Normal. Negative for fracture or focal lesion. Sinuses/Orbits: Mucoperiosteal thickening of bilateral ethmoid, bilateral frontal and anterior left sphenoid sinus are noted. The orbits are normal. Other: None. IMPRESSION: No focal acute intracranial abnormality identified. Chronic diffuse atrophy. Chronic bilateral periventricular white matter small vessel ischemic change. Electronically Signed   By: Abelardo Diesel M.D.   On: 11/11/2018 07:32   Dg Chest Portable 1 View  Result  Date: 11/11/2018 CLINICAL DATA:  63 year old male with right side weakness onset around midnight. Shortness of breath. EXAM: PORTABLE CHEST 1 VIEW COMPARISON:  Portable chest 09/20/2018 and earlier. FINDINGS: Portable AP upright view at 0606 hours. Lung volumes and mediastinal contours are within normal limits. Allowing for portable technique the lungs are clear. Visualized tracheal air column is within normal limits. No pneumothorax. No acute osseous abnormality identified. IMPRESSION: Negative.  No acute cardiopulmonary abnormality. Electronically Signed   By: Genevie Ann M.D.   On: 11/11/2018 06:45    ____________________________________________   PROCEDURES  Critical Care performed: Yes, see critical care procedure note(s)   Procedure(s) performed:   .Critical Care Performed by: Hinda Kehr, MD Authorized by: Hinda Kehr, MD   Critical care provider statement:    Critical care time (minutes):  30   Critical care time was exclusive of:  Separately billable procedures and treating other patients   Critical care was necessary to treat or prevent imminent or life-threatening deterioration of the following conditions:  Metabolic crisis (HHS/HHNC)   Critical care was time spent personally by me on the following activities:  Development of treatment plan with patient or surrogate, discussions with consultants, evaluation of patient's response to treatment, examination of patient, obtaining history from patient or surrogate, ordering and performing treatments and interventions, ordering and review of laboratory studies, ordering and review of radiographic studies, pulse oximetry, re-evaluation of patient's condition and review of old charts     ____________________________________________   INITIAL IMPRESSION / [place] / ED COURSE  As part of my medical decision making, I reviewed the following data within the [place] notes reviewed and incorporated,  Labs reviewed , EKG interpreted , Old chart reviewed, Radiograph reviewed , Discussed with admitting physician ,  Notes from prior ED visits and [place] Controlled Substance Database    Differential diagnosis includes, but is not limited to, HHS/agency, DKA, acute infection such as pneumonia or urinary tract infection, intoxication either from drugs or alcohol or both, sepsis, CVA/TIA.  The patient's fingerstick blood sugar was greater than 600 and I looked through his medical record and see a history of dehydration, noncompliance, hyperglycemia, and "somnolence" as well as other nonspecific altered mental status.  This presentation seems consistent with prior.  I do believe that he is critically ill from a metabolic standpoint and and performing a broad laboratory evaluation.  I will hold off on imaging at this time until I can get the results of his lab work back and until I am certain he is stable to go to the [facility] but I suspect he will need a screening CT of the head without contrast to make sure there is no evidence of acute bleeding nor any obvious sign of CVA.  I have a very low suspicion for CVA, and he would not be able to tolerate an MRI at this time regardless.  I am starting 1 L normal saline but will hold off on any pain medicine or sedating agents so as not to worsen his mental status.  Clinical Course as of Nov 11 749  Tue Nov 11, 2018  0600 The patient is noted to have a lactate>4. With the current information available to me, I don't think the patient is in septic shock. The lactate>4, is related to volume depletion in the setting of HHS.   Lactic Acid, Venous(!!): 4.5 [CF]  0602 Glucose(!!): 755 [CF]  0603 The patient's sodium corrected for hyperglycemia is 145 which is reassuring.  Sodium(!): 129 [CF]  0603 Potassium is within normal limits which is reassuring.  Potassium: 4.3 [CF]  0603 Given the patient's confusion/altered mental status and his extremely elevated glucose as well  as apparent volume depletion, I think his symptoms are most consistent with HHS/HHNC.  There is no sign of shock and he does not meet sepsis criteria in spite of his lactic acid.  He has an elevated respiratory rate that I believe is secondary to his metabolic derangement but as described above I do not feel that the lactic acid represents infection.  I have ordered a chest x-ray for further evaluation and to rule out any acute infectious abnormality in his chest.  The nurses are performing in and out catheterization for a urine specimen as well.   [CF]  [phone] UDS only positive for cannabinoids  Cannabinoid 50 Ng, Ur [place](!): POSITIVE [CF]  0652 Generally unremarkable VBG  Blood gas, venous(!!) [CF]  2297 No evidence of UTI  Urinalysis, Routine w reflex microscopic(!) [CF]  9892 Negative ethanol, normal beta-hydroxybutyric acid, normal troponin    [CF]  0726 Patient currently in CT scan.  Spoke a few minutes ago with Dr. Leslye Peer with the hospitalist service who will admit.  Patient's fiancee is now here and I spoke with her at bedside and told her of the plan.   [CF]  0751 No acute abnormalities on CT head.   [CF]    Clinical Course User Index [CF] Hinda Kehr, MD    ____________________________________________  FINAL CLINICAL IMPRESSION(S) / ED DIAGNOSES  Final diagnoses:  HHNC (hyperglycemic hyperosmolar nonketotic coma) (HCC)  Elevated lactic acid level  Dehydration  Altered mental status, unspecified altered mental status type     MEDICATIONS GIVEN DURING THIS VISIT:  Medications  insulin regular,  human (MYXREDLIN) 100 units/ 100 mL infusion (3.7 Units/hr Intravenous Rate/Dose Change 11/11/18 0734)  potassium chloride 10 mEq in 100 mL IVPB (10 mEq Intravenous New Bag/Given 11/11/18 0742)  sodium chloride 0.9 % bolus 1,000 mL (0 mLs Intravenous Stopped 11/11/18 0730)    And  0.9 %  sodium chloride infusion ( Intravenous New Bag/Given 11/11/18 0743)  sodium chloride 0.9 % bolus  1,000 mL (0 mLs Intravenous Stopped 11/11/18 0730)     ED Discharge Orders    None       Note:  This document was prepared using Dragon voice recognition software and may include unintentional dictation errors.    Hinda Kehr, MD 11/11/18 [phone]

## 2018-11-11 NOTE — Progress Notes (Signed)
Patient ID: Richard [name], male   DOB: 12-08-1955, 63 y.o.   MRN: [id number]  ACP note  Patient unable to participate in this conversation. [name] made decision for him.  CODE STATUS discussed and patient is a DO NOT RESUSCITATE  Diagnosis: Diabetes with hyperosmolar coma, acute encephalopathy, hyponatremia, lactic acidosis, history of CAD, history of pancreatic cancer.  Time spent on ACP discussion 17 minutes Dr. Loletha Grayer

## 2018-11-11 NOTE — ED Notes (Signed)
Floor unable to take report at this time.

## 2018-11-11 NOTE — ED Notes (Signed)
Date and time results received: 11/11/18 0600 (use smartphrase ".now" to insert current time)  Test: lactic Acid Critical Value: 4.5  Name of Provider Notified: dr. Karma Greaser  Orders Received? Or Actions Taken?:

## 2018-11-11 NOTE — ED Notes (Signed)
Date and time results received: 11/11/18 0557 (use smartphrase ".now" to insert current time)  Test: Glusose  Critical Value: [place]  Name of Provider Notified: Dr. Karma Greaser  Orders Received? Or Actions Taken?:

## 2018-11-11 NOTE — H&P (Signed)
[facility] [place] [facility] NAME: Richard [name]    MR#:  [id number]  DATE OF BIRTH:  1956-03-12  DATE OF ADMISSION:  11/11/2018  PRIMARY CARE PHYSICIAN: [facility] [place]   REQUESTING/REFERRING PHYSICIAN: Dr Hinda Kehr  CHIEF COMPLAINT:  Altered mental status  HISTORY OF PRESENT ILLNESS:  Richard [name]  is a 63 y.o. male with a known history of diabetes.  Presents with altered mental status.  As per [facility], the patient has not been feeling well for the last few days.  He woke up confused at 4 in the morning.  He stated that his right side was numb and he could not move his right side very well.  Patient still confused in the emergency room.  Patient unable to provide any history at this time.  Patient moving his extremities on his own.  PAST MEDICAL HISTORY:   Past Medical History:  Diagnosis Date  . Atrial fibrillation ([place])    per patient  . Cancer ([place])   . CHF (congestive heart failure) ([place])   . Chronic back pain   . Chronic leg pain   . Coronary artery disease   . Diabetes mellitus without complication ([place])   . Hypertension   . Neuropathy     PAST SURGICAL HISTORY:   Past Surgical History:  Procedure Laterality Date  . CORONARY ANGIOPLASTY WITH STENT PLACEMENT    . ESOPHAGOGASTRODUODENOSCOPY N/A 09/11/2017   Procedure: ESOPHAGOGASTRODUODENOSCOPY (EGD);  Surgeon: Virgel Manifold, MD;  Location: [facility] ENDOSCOPY;  Service: Endoscopy;  Laterality: N/A;  . pancreas removed      SOCIAL HISTORY:   Social History   Tobacco Use  . Smoking status: Current Every Day Smoker    Packs/day: 0.50  . Smokeless tobacco: Never Used  Substance Use Topics  . Alcohol use: No    Alcohol/week: 0.0 standard drinks    FAMILY HISTORY:   Family History  Problem Relation Age of Onset  . CAD Mother   . Diabetes Mellitus II Mother   . Diabetes Mellitus II Father     DRUG ALLERGIES:  No Known  Allergies  REVIEW OF SYSTEMS:  Unable to provide review of systems secondary to altered mental status.  MEDICATIONS AT HOME:   Prior to Admission medications   Medication Sig Start Date End Date Taking? Authorizing Provider  ACCU-CHEK AVIVA PLUS test strip U ONCE TO BID UTD 09/23/18   Nicholes Mango, MD  ACCU-CHEK SOFTCLIX LANCETS lancets TEST 1-2 XD 09/23/18   Gouru, Illene Silver, MD  acetaminophen (TYLENOL) 325 MG tablet Take 1 tablet (325 mg total) by mouth every 6 (six) hours as needed for mild pain (or Fever >/= 101). 01/11/18   Gouru, Aruna, MD  amitriptyline (ELAVIL) 50 MG tablet Take 1 tablet (50 mg total) by mouth at bedtime. 08/04/18   Salary, Avel Peace, MD  apixaban (ELIQUIS) 5 MG TABS tablet Take 1 tablet (5 mg total) by mouth 2 (two) times daily. 09/23/18   Nicholes Mango, MD  aspirin 81 MG chewable tablet Chew 1 tablet (81 mg total) by mouth daily. 01/12/18   Nicholes Mango, MD  atorvastatin (LIPITOR) 40 MG tablet Take 1 tablet (40 mg total) by mouth every evening. 01/11/18   Gouru, Aruna, MD  Blood Glucose Monitoring Suppl (ACCU-CHEK AVIVA PLUS) w/Device KIT U TO TEST ONCE TO BID 09/23/18   Gouru, Illene Silver, MD  gabapentin (NEURONTIN) 100 MG capsule Take 1 capsule (100 mg total) by mouth 3 (three) times  daily. 08/04/18   Salary, Avel Peace, MD  insulin aspart (NOVOLOG) 100 UNIT/ML injection Inject 0-15 Units into the skin 3 (three) times daily with meals. Correction coverage: Moderate (average weight, post-op) CBG < 70: implement hypoglycemia protocol CBG 70 - 120: 0 units CBG 121 - 150: 2 units CBG 151 - 200: 3 units CBG 201 - 250: 5 units CBG 251 - 300: 8 units CBG 301 - 350: 11 units CBG 351 - 400: 15 units CBG > 400: call MD 09/23/18   Nicholes Mango, MD  insulin aspart (NOVOLOG) 100 UNIT/ML injection Inject 0-5 Units into the skin at bedtime. CBG < 70: implement hypoglycemia protocol CBG 70 - 120: 0 units CBG 121 - 150: 0 units CBG 151 - 200: 0 units CBG 201 - 250: 2 units CBG 251 - 300: 3  units CBG 301 - 350: 4 units CBG 351 - 400: 5 units CBG > 400: call MD 09/23/18   Nicholes Mango, MD  insulin aspart (NOVOLOG) 100 UNIT/ML injection Inject 5 Units into the skin 3 (three) times daily before meals. May have insulin pens 09/23/18   Gouru, Illene Silver, MD  insulin detemir (LEVEMIR) 100 UNIT/ML injection Inject 0.17 mLs (17 Units total) into the skin daily. 09/25/18   Nicholes Mango, MD  metoprolol tartrate (LOPRESSOR) 25 MG tablet Take 1 tablet (25 mg total) by mouth 2 (two) times daily. 01/11/18   Nicholes Mango, MD  Multiple Vitamin (MULTIVITAMIN WITH MINERALS) TABS tablet Take 1 tablet by mouth daily. 01/12/18   Nicholes Mango, MD  Needles & Syringes MISC Use as directed 09/23/18   Nicholes Mango, MD  traZODone (DESYREL) 50 MG tablet Take 1 tablet (50 mg total) by mouth at bedtime as needed for sleep. 08/04/18   Salary, Avel Peace, MD      VITAL SIGNS:  Blood pressure (!) 164/96, pulse 93, temperature (!) 97.5 F (36.4 C), temperature source Oral, resp. rate (!) 29, height 6' (1.829 m), weight 54.9 kg, SpO2 99 %.  PHYSICAL EXAMINATION:  GENERAL:  63 y.o.-year-old patient lying in the bed with no acute respiratory distress.  EYES: Pupils equal, round, reactive to light and accommodation. No scleral icterus. HEENT: Head atraumatic, normocephalic.  Unable to look into mouth because he would not open his mouth. NECK:  Supple, no jugular venous distention. No thyroid enlargement, no tenderness.  LUNGS: Normal breath sounds bilaterally, no wheezing, rales,rhonchi or crepitation. No use of accessory muscles of respiration.  CARDIOVASCULAR: S1, S2 normal. No murmurs, rubs, or gallops.  ABDOMEN: Soft, slight abdominal tenderness, nondistended. Bowel sounds present. No organomegaly or mass.  EXTREMITIES: No pedal edema, cyanosis, or clubbing.  NEUROLOGIC: Patient able to move all the extremities on his own.  Able to flex and extend at his ankles to command. PSYCHIATRIC: The patient is lethargic.  SKIN:  No rash, lesion, or ulcer.   LABORATORY PANEL:   CBC Recent Labs  Lab 11/11/18 0508  WBC 7.1  HGB 12.4*  HCT 37.9*  PLT 358   ------------------------------------------------------------------------------------------------------------------  Chemistries  Recent Labs  Lab 11/11/18 0508  NA 129*  K 4.3  CL 94*  CO2 26  GLUCOSE 755*  BUN 11  CREATININE 1.04  CALCIUM 9.5  AST 17  ALT 13  ALKPHOS 31*  BILITOT 0.7   ------------------------------------------------------------------------------------------------------------------  Cardiac Enzymes Recent Labs  Lab 11/11/18 0508  TROPONINI <0.03   ------------------------------------------------------------------------------------------------------------------  RADIOLOGY:  Ct Head Wo Contrast  Result Date: 11/11/2018 CLINICAL DATA:  Altered level of consciousness EXAM: CT HEAD  WITHOUT CONTRAST TECHNIQUE: Contiguous axial images were obtained from the base of the skull through the vertex without intravenous contrast. COMPARISON:  September 20, 2018 FINDINGS: Brain: No evidence of acute infarction, hemorrhage, hydrocephalus, extra-axial collection or mass lesion/mass effect. There is chronic diffuse atrophy. Chronic bilateral periventricular white matter small vessel ischemic changes noted. Vascular: No hyperdense vessel is noted. Skull: Normal. Negative for fracture or focal lesion. Sinuses/Orbits: Mucoperiosteal thickening of bilateral ethmoid, bilateral frontal and anterior left sphenoid sinus are noted. The orbits are normal. Other: None. IMPRESSION: No focal acute intracranial abnormality identified. Chronic diffuse atrophy. Chronic bilateral periventricular white matter small vessel ischemic change. Electronically Signed   By: Abelardo Diesel M.D.   On: 11/11/2018 07:32   Dg Chest Portable 1 View  Result Date: 11/11/2018 CLINICAL DATA:  63 year old male with right side weakness onset around midnight. Shortness of breath.  EXAM: PORTABLE CHEST 1 VIEW COMPARISON:  Portable chest 09/20/2018 and earlier. FINDINGS: Portable AP upright view at 0606 hours. Lung volumes and mediastinal contours are within normal limits. Allowing for portable technique the lungs are clear. Visualized tracheal air column is within normal limits. No pneumothorax. No acute osseous abnormality identified. IMPRESSION: Negative.  No acute cardiopulmonary abnormality. Electronically Signed   By: Genevie Ann M.D.   On: 11/11/2018 06:45    EKG:   Normal sinus rhythm 85 bpm, incomplete right bundle branch block, left anterior fascicular block.  IMPRESSION AND PLAN:   1.  Diabetes mellitus with hyperosmolar coma.  Admit to CCU stepdown.  Insulin drip protocol.  Serial BMPs. 2.  Acute encephalopathy.  Likely secondary to hyperosmolar coma.  Continue to watch mental status closely.  CT scan of the head negative.  We will give rectal aspirin for right now.  Can consider MRI of the brain if mental status does not improve once sugars are better controlled. 3.  Hyponatremia secondary to hyperosmolar coma.  Should improve with IV fluids. 4.  Lactic acidosis.  Likely with dehydration and hyperosmolar coma.  No signs of infection currently. 5.  History of CAD on metoprolol and aspirin and statin. 6.  History of pancreatic cancer status post Whipple procedure.  All the records are reviewed and case discussed with ED provider. Management plans discussed with the patient, family and they are in agreement.  CODE STATUS: DNR  TOTAL TIME TAKING CARE OF THIS PATIENT: 50 minutes.    Loletha Grayer M.D on 11/11/2018 at 8:00 AM  Between 7am to 6pm - Pager - [phone]  After 6pm call admission pager [phone]  [facility]  [phone]  CC: Primary care physician; [facility] [place]

## 2018-11-11 NOTE — ED Triage Notes (Signed)
Pt arrived from home via EMS with complaints of right sided weakness that started when he was preparing for bed around midnight tonight. Pt is alert and oriented x 4. EMS stated that pt had abnormal grip strength but they believe it was due to pain. EMS states that stroke screen was negative. Pt complains of right arm pain and weakness. Pt is a diabetic and EMS stated that his blood glucose reading was "HIGH". EMS gave 324 ASA en route to ED. Pt cell phone and charger are at the bedside.

## 2018-11-12 ENCOUNTER — Inpatient Hospital Stay
Admit: 2018-11-12 | Discharge: 2018-11-12 | Disposition: A | Discharge status: Home / Self Care | Payer: Medicare Other | Attending: Pulmonary Disease | Admitting: Pulmonary Disease

## 2018-11-12 DIAGNOSIS — E1101 Type 2 diabetes mellitus with hyperosmolarity with coma: Principal | ICD-10-CM

## 2018-11-12 DIAGNOSIS — R001 Bradycardia, unspecified: Secondary | ICD-10-CM

## 2018-11-12 DIAGNOSIS — M79672 Pain in left foot: Secondary | ICD-10-CM

## 2018-11-12 DIAGNOSIS — I361 Nonrheumatic tricuspid (valve) insufficiency: Secondary | ICD-10-CM

## 2018-11-12 DIAGNOSIS — I2510 Atherosclerotic heart disease of native coronary artery without angina pectoris: Secondary | ICD-10-CM

## 2018-11-12 DIAGNOSIS — R9431 Abnormal electrocardiogram [ECG] [EKG]: Secondary | ICD-10-CM

## 2018-11-12 DIAGNOSIS — Z89421 Acquired absence of other right toe(s): Secondary | ICD-10-CM

## 2018-11-12 DIAGNOSIS — I10 Essential (primary) hypertension: Secondary | ICD-10-CM

## 2018-11-12 LAB — GLUCOSE, CAPILLARY
GLUCOSE-CAPILLARY: 181 mg/dL — AB (ref 70–99)
Glucose-Capillary: 163 mg/dL — ABNORMAL HIGH (ref 70–99)
Glucose-Capillary: 133 mg/dL — ABNORMAL HIGH (ref 70–99)
Glucose-Capillary: 252 mg/dL — ABNORMAL HIGH (ref 70–99)
Glucose-Capillary: 297 mg/dL — ABNORMAL HIGH (ref 70–99)
Glucose-Capillary: 193 mg/dL — ABNORMAL HIGH (ref 70–99)

## 2018-11-12 LAB — BASIC METABOLIC PANEL
Anion gap: 8 (ref 5–15)
BUN: 8 mg/dL (ref 8–23)
CO2: 26 mmol/L (ref 22–32)
Calcium: 8.5 mg/dL — ABNORMAL LOW (ref 8.9–10.3)
Chloride: 102 mmol/L (ref 98–111)
Creatinine, Ser: 0.7 mg/dL (ref 0.61–1.24)
GFR calc Af Amer: >60 mL/min (ref >60)
GFR calc non Af Amer: >60 mL/min (ref >60)
Glucose, Bld: 170 mg/dL — ABNORMAL HIGH (ref 70–99)
Potassium: 3.4 mmol/L — ABNORMAL LOW (ref 3.5–5.1)
Sodium: 136 mmol/L (ref 135–145)

## 2018-11-12 LAB — HEPARIN LEVEL (UNFRACTIONATED)
Heparin Unfractionated: 0.26 IU/mL — ABNORMAL LOW (ref 0.30–0.70)
Heparin Unfractionated: 0.2 IU/mL — ABNORMAL LOW (ref 0.30–0.70)
Heparin Unfractionated: 0.28 IU/mL — ABNORMAL LOW (ref 0.30–0.70)

## 2018-11-12 LAB — CBC
HEMATOCRIT: 35.3 % — AB (ref 39.0–52.0)
Hemoglobin: 11.4 g/dL — ABNORMAL LOW (ref 13.0–17.0)
MCH: 29 pg (ref 26.0–34.0)
MCHC: 32.3 g/dL (ref 30.0–36.0)
MCV: 89.8 fL (ref 80.0–100.0)
Platelets: 310 10*3/uL (ref 150–400)
RBC: 3.93 MIL/uL — ABNORMAL LOW (ref 4.22–5.81)
RDW: 12.9 % (ref 11.5–15.5)
WBC: 10.7 10*3/uL — AB (ref 4.0–10.5)
nRBC: 0 % (ref 0.0–0.2)

## 2018-11-12 LAB — HIV ANTIBODY (ROUTINE TESTING W REFLEX): HIV Screen 4th Generation wRfx: NONREACTIVE

## 2018-11-12 LAB — TROPONIN I: Troponin I: <0.03 ng/mL (ref <0.03)

## 2018-11-12 LAB — PROCALCITONIN: Procalcitonin: <0.1 ng/mL

## 2018-11-12 LAB — HEMOGLOBIN A1C
Hgb A1c MFr Bld: 14.8 % — ABNORMAL HIGH (ref 4.8–5.6)
Mean Plasma Glucose: 378.06 mg/dL

## 2018-11-12 MED ORDER — ENSURE ENLIVE PO LIQD
237.0000 mL | Freq: Two times a day (BID) | ORAL | Status: DC
  Administered 2018-11-13 – 2018-11-18 (×8): 237 mL via ORAL

## 2018-11-12 MED ORDER — HEPARIN BOLUS VIA INFUSION
900.0000 [IU] | Freq: Once | INTRAVENOUS | Status: AC
  Administered 2018-11-12: 900 [IU] via INTRAVENOUS
  Filled 2018-11-12: qty 900

## 2018-11-12 MED ORDER — INSULIN ASPART 100 UNIT/ML ~~LOC~~ SOLN
0.0000 [IU] | Freq: Three times a day (TID) | SUBCUTANEOUS | Status: DC
  Administered 2018-11-12: 8 [IU] via SUBCUTANEOUS
  Administered 2018-11-13: 5 [IU] via SUBCUTANEOUS
  Filled 2018-11-12 (×2): qty 1

## 2018-11-12 MED ORDER — ASPIRIN 325 MG PO TABS
325.0000 mg | ORAL_TABLET | Freq: Every day | ORAL | Status: DC
  Administered 2018-11-12 – 2018-11-18 (×5): 325 mg via ORAL
  Filled 2018-11-12 (×7): qty 1

## 2018-11-12 MED ORDER — HEPARIN BOLUS VIA INFUSION
800.0000 [IU] | Freq: Once | INTRAVENOUS | Status: AC
  Administered 2018-11-12: 800 [IU] via INTRAVENOUS
  Filled 2018-11-12: qty 800

## 2018-11-12 MED ORDER — POTASSIUM CHLORIDE CRYS ER 20 MEQ PO TBCR
40.0000 meq | EXTENDED_RELEASE_TABLET | Freq: Once | ORAL | Status: AC
  Administered 2018-11-12: 40 meq via ORAL
  Filled 2018-11-12: qty 2

## 2018-11-12 MED ORDER — HEPARIN BOLUS VIA INFUSION
850.0000 [IU] | Freq: Once | INTRAVENOUS | Status: AC
  Administered 2018-11-12: 850 [IU] via INTRAVENOUS
  Filled 2018-11-12: qty 850

## 2018-11-12 MED ORDER — POTASSIUM CHLORIDE CRYS ER 20 MEQ PO TBCR
20.0000 meq | EXTENDED_RELEASE_TABLET | Freq: Once | ORAL | Status: AC
  Administered 2018-11-12: 20 meq via ORAL
  Filled 2018-11-12: qty 1

## 2018-11-12 MED ORDER — HYDROCODONE-ACETAMINOPHEN 5-325 MG PO TABS
1.0000 | ORAL_TABLET | Freq: Four times a day (QID) | ORAL | Status: DC | PRN
  Administered 2018-11-12 – 2018-11-13 (×5): 2 via ORAL
  Administered 2018-11-14: 1 via ORAL
  Administered 2018-11-15 – 2018-11-18 (×5): 2 via ORAL
  Filled 2018-11-12 (×2): qty 1
  Filled 2018-11-12 (×6): qty 2
  Filled 2018-11-12: qty 1
  Filled 2018-11-12 (×3): qty 2

## 2018-11-12 MED ORDER — INSULIN DETEMIR 100 UNIT/ML ~~LOC~~ SOLN
10.0000 [IU] | Freq: Every day | SUBCUTANEOUS | Status: DC
  Administered 2018-11-12 – 2018-11-13 (×2): 10 [IU] via SUBCUTANEOUS
  Filled 2018-11-12 (×3): qty 0.1

## 2018-11-12 MED ORDER — INSULIN ASPART 100 UNIT/ML ~~LOC~~ SOLN: 0.0000 [IU] | Freq: Every day | SUBCUTANEOUS | Status: DC

## 2018-11-12 NOTE — Progress Notes (Signed)
Inpatient Diabetes Program Recommendations  AACE/ADA: New Consensus Statement on Inpatient Glycemic Control (2015)  Target Ranges:  Prepandial:   less than 140 mg/dL      Peak postprandial:   less than 180 mg/dL (1-2 hours)      Critically ill patients:  140 - 180 mg/dL   Lab Results  Component Value Date   GLUCAP 133 (H) 11/12/2018   HGBA1C 13.6 (H) 08/03/2018    Review of Glycemic ControlResults for Richard [name], Richard [name] (MRN [id number]) as of 11/12/2018 14:53  Ref. Range 11/11/2018 09:58 11/11/2018 12:38 11/11/2018 16:03 11/11/2018 19:33 11/11/2018 23:15 11/12/2018 03:26 11/12/2018 07:42 11/12/2018 11:50  Glucose-Capillary Latest Ref Range: 70 - 99 mg/dL 191 (H) 206 (H) 100 (H) 182 (H) 107 (H) 163 (H) 181 (H) 133 (H)   Home DM Meds: Levemir 17 units Daily                             Novolog 5 units TID with meals Current orders for Inpatient glycemic control:  Novolog moderate tid with meals and HS, Levemir 10 units daily  Inpatient Diabetes Program Recommendations:    Spoke with patient regarding home DM control.  He states that he is consistently taking insulin as ordered however he did not take it the day he came to the hospital.  He states that when he checks blood sugars, they are in the low 200's.  He states that he he concerned that he "cannot gain weight".  We discussed that controlling blood sugars can also help with weight gain.  He states that he "does not have an appetite".  Discussed with dietician and she states that she plans to see patient.  Will follow blood sugars while in the hospital.  Currently they are within goal.  Patient states that he has all needed supplies for blood sugar management at home including insulin, meter, strips and needles.   -May be helpful to have updated A1C (last A1C in October of 2019).  Will follow.   Thanks  Adah Perl, RN, BC-ADM Inpatient Diabetes Coordinator Pager [phone]

## 2018-11-12 NOTE — Consult Note (Signed)
[facility] Vascular Consult Note  MRN : [id number]  Richard [name] is a 63 y.o. (11-22-1955) male who presents with chief complaint of No chief complaint on file. Marland Kitchen  History of Present Illness: I am asked to see the patient by Dr. Alva Garnet for PAD.  The patient has a previous history of transmetatarsal amputation on the right about 3 to 4 years ago.  This has healed.  It was done for pain and infection.  It was noted that his pedal pulses on the left were poor and he has been complaining of left foot pain.  The pain is in the ball of the foot.  Being up on the foot or any activity makes it worse.  He does appear to dangle his feet to try to get some relief.  He does not have any open ulceration or infection.  This has been worse over the past several days.  He has complained of claudication type symptoms in that leg for many months.  He is admitted to the hospital with hyperosmolar coma with poorly controlled diabetes.  With insulin infusion his sugars have come under better control while in the hospital and his mental status is improved.  He was also started on heparin for chest pain and possible cardiac event.  Current Facility-Administered Medications  Medication Dose Route Frequency Provider Last Rate Last Dose  . aspirin tablet 325 mg  325 mg Oral Daily Charlett Nose, RPH   325 mg at 11/12/18 1107  . atorvastatin (LIPITOR) tablet 40 mg  40 mg Oral QPM Wieting, Richard, MD      . heparin ADULT infusion 100 units/mL (25000 units/224mL sodium chloride 0.45%)  800 Units/hr Intravenous Continuous Darel Hong D, NP 8 mL/hr at 11/12/18 0900 800 Units/hr at 11/12/18 0900  . insulin aspart (novoLOG) injection 0-15 Units  0-15 Units Subcutaneous TID WC Wilhelmina Mcardle, MD      . insulin aspart (novoLOG) injection 0-5 Units  0-5 Units Subcutaneous QHS Wilhelmina Mcardle, MD      . insulin detemir (LEVEMIR) injection 10 Units  10 Units Subcutaneous Daily Wilhelmina Mcardle, MD       . morphine 2 MG/ML injection 2 mg  2 mg Intravenous Q4H PRN Darel Hong D, NP      . multivitamin with minerals tablet 1 tablet  1 tablet Oral Daily Loletha Grayer, MD   1 tablet at 11/12/18 1107  . nitroGLYCERIN (NITROSTAT) SL tablet 0.4 mg  0.4 mg Sublingual Q5 min PRN Bradly Bienenstock, NP      . traZODone (DESYREL) tablet 50 mg  50 mg Oral QHS PRN Loletha Grayer, MD        Past Medical History:  Diagnosis Date  . Atrial fibrillation ([place])    per patient  . Cancer ([place])   . CHF (congestive heart failure) ([place])   . Chronic back pain   . Chronic leg pain   . Coronary artery disease   . Diabetes mellitus without complication ([place])   . Hypertension   . Neuropathy     Past Surgical History:  Procedure Laterality Date  . CORONARY ANGIOPLASTY WITH STENT PLACEMENT    . ESOPHAGOGASTRODUODENOSCOPY N/A 09/11/2017   Procedure: ESOPHAGOGASTRODUODENOSCOPY (EGD);  Surgeon: Virgel Manifold, MD;  Location: [facility] ENDOSCOPY;  Service: Endoscopy;  Laterality: N/A;  . pancreas removed      Social History Social History   Tobacco Use  . Smoking status: Current Every Day Smoker  Packs/day: 0.50  . Smokeless tobacco: Never Used  Substance Use Topics  . Alcohol use: No    Alcohol/week: 0.0 standard drinks  . 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 Cocaine used over a year ago per pt.    Family History Family History  Problem Relation Age of Onset  . CAD Mother   . Diabetes Mellitus II Mother   . Diabetes Mellitus II Father   No bleeding or clotting disorders  No Known Allergies   REVIEW OF SYSTEMS (Negative unless checked)  Constitutional: [] Weight loss  [] Fever  [] Chills Cardiac: [x] Chest pain   [] Chest pressure   [x] Palpitations   [] Shortness of breath when laying flat   [] Shortness of breath at rest   [] Shortness of breath with exertion. Vascular:  [x] Pain in legs with walking   [] Pain in legs at rest   [] Pain in legs when  laying flat   [x] Claudication   [] Pain in feet when walking  [x] Pain in feet at rest  [x] Pain in feet when laying flat   [] History of DVT   [] Phlebitis   [] Swelling in legs   [] Varicose veins   [] Non-healing ulcers Pulmonary:   [] Uses home oxygen   [] Productive cough   [] Hemoptysis   [] Wheeze  [] COPD   [] Asthma Neurologic:  [] Dizziness  [x] Blackouts   [] Seizures   [] History of stroke   [] History of TIA  [] Aphasia   [] Temporary blindness   [] Dysphagia   [] Weakness or numbness in arms   [x] Weakness or numbness in legs Musculoskeletal:  [] Arthritis   [] Joint swelling   [] Joint pain   [] Low back pain Hematologic:  [] Easy bruising  [] Easy bleeding   [] Hypercoagulable state   [] Anemic  [] Hepatitis Gastrointestinal:  [] Blood in stool   [] Vomiting blood  [] Gastroesophageal reflux/heartburn   [] Difficulty swallowing. Genitourinary:  [] Chronic kidney disease   [] Difficult urination  [] Frequent urination  [] Burning with urination   [] Blood in urine Skin:  [] Rashes   [] Ulcers   [] Wounds Psychological:  [] History of anxiety   []  History of major depression.  Physical Examination  Vitals:   11/12/18 0800 11/12/18 0900 11/12/18 1000 11/12/18 1100  BP:  (!) 145/77 123/78 134/88  Pulse: (!) 41 65 78 81  Resp:  (!) 29 17 (!) 31  Temp:  98.3 F (36.8 C)    TempSrc:  Oral    SpO2: 100% 100% 100% 100%  Weight:      Height:       Body mass index is 17.37 kg/m. Gen:  Very thin, NAD Head: Blue/AT, No temporalis wasting. Ear/Nose/Throat: Hearing grossly intact, nares w/o erythema or drainage, oropharynx w/o Erythema/Exudate Eyes: Sclera non-icteric, conjunctiva clear Neck: Trachea midline.  No JVD.  Pulmonary:  Good air movement, respirations not labored, equal bilaterally.  Cardiac: RRR, normal S1, S2. Vascular:  Vessel Right Left  Radial Palpable Palpable                          PT 1+ Palpable Not Palpable  DP Not Palpable Trace Palpable   Gastrointestinal: soft, non-tender/non-distended. No  guarding/reflex.  Musculoskeletal: M/S 5/5 throughout.  Right TMA well healed.   Neurologic: Sensation grossly intact in extremities.  Symmetrical.  Speech is fluent. Motor exam as listed above. Psychiatric: Judgment intact, Mood & affect appropriate for pt's clinical situation. Dermatologic: No rashes or ulcers noted.  No cellulitis or open wounds.       CBC  Lab Results  Component Value Date   WBC 10.7 (H) 11/12/2018   HGB 11.4 (L) 11/12/2018   HCT 35.3 (L) 11/12/2018   MCV 89.8 11/12/2018   PLT 310 11/12/2018    BMET    Component Value Date/Time   NA 136 11/12/2018 0301   K 3.4 (L) 11/12/2018 0301   CL 102 11/12/2018 0301   CO2 26 11/12/2018 0301   GLUCOSE 170 (H) 11/12/2018 0301   BUN 8 11/12/2018 0301   CREATININE 0.70 11/12/2018 0301   CALCIUM 8.5 (L) 11/12/2018 0301   GFRNONAA >60 11/12/2018 0301   GFRAA >60 11/12/2018 0301   Estimated Creatinine Clearance: 73.4 mL/min (by C-G formula based on SCr of 0.7 mg/dL).  COAG Lab Results  Component Value Date   INR 1.13 11/11/2018   INR 1.17 09/20/2018   INR 0.95 08/03/2018    Radiology Ct Head Wo Contrast  Result Date: 11/11/2018 CLINICAL DATA:  Altered level of consciousness EXAM: CT HEAD WITHOUT CONTRAST TECHNIQUE: Contiguous axial images were obtained from the base of the skull through the vertex without intravenous contrast. COMPARISON:  September 20, 2018 FINDINGS: Brain: No evidence of acute infarction, hemorrhage, hydrocephalus, extra-axial collection or mass lesion/mass effect. There is chronic diffuse atrophy. Chronic bilateral periventricular white matter small vessel ischemic changes noted. Vascular: No hyperdense vessel is noted. Skull: Normal. Negative for fracture or focal lesion. Sinuses/Orbits: Mucoperiosteal thickening of bilateral ethmoid, bilateral frontal and anterior left sphenoid sinus are noted. The orbits are normal. Other: None. IMPRESSION: No focal acute intracranial abnormality identified.  Chronic diffuse atrophy. Chronic bilateral periventricular white matter small vessel ischemic change. Electronically Signed   By: Abelardo Diesel M.D.   On: 11/11/2018 07:32   Dg Chest Portable 1 View  Result Date: 11/11/2018 CLINICAL DATA:  63 year old male with right side weakness onset around midnight. Shortness of breath. EXAM: PORTABLE CHEST 1 VIEW COMPARISON:  Portable chest 09/20/2018 and earlier. FINDINGS: Portable AP upright view at 0606 hours. Lung volumes and mediastinal contours are within normal limits. Allowing for portable technique the lungs are clear. Visualized tracheal air column is within normal limits. No pneumothorax. No acute osseous abnormality identified. IMPRESSION: Negative.  No acute cardiopulmonary abnormality. Electronically Signed   By: Genevie Ann M.D.   On: 11/11/2018 06:45      Assessment/Plan 1.  Likely rest pain of the left foot with suspected PAD.  Has already had a transmetatarsal amputation on the right.  With his clinical history and his pain, angiogram should be performed for evaluation of his circulation as well as potential reperfusion if amenable disease is found.  Timing on this is flexible.  He is not an immediate limb threat and if this needs to wait a few days for other medical issues, I will plan to put this on the schedule for Monday.  I have discussed the pathophysiology and natural history of peripheral arterial disease.  The patient voices his understanding and is agreeable to proceed. 2.  Diabetes with for control and admission for hyperosmolar coma.  Glucoses have come under control with insulin.  These will continue to be monitored.  Ongoing glucose 3. CAD. Continue cardiac and antihypertensive medications as already ordered and reviewed, no changes at this time. Continue statin as ordered and reviewed, no changes at this time Nitrates PRN for chest pain 4. HTN.  Stable on outpatient medication and blood pressure control important in reducing the  progression of atherosclerotic disease. On appropriate oral medications.    Leotis Pain, MD  11/12/2018 12:10 PM    This note was created with Dragon medical transcription system.  Any error is purely unintentional

## 2018-11-12 NOTE — Consult Note (Signed)
Richard [name] is a 63 y.o. male  [id number]  Primary Cardiologist: Dr. Neoma Laming Reason for Consultation: atrial fibrillation/ bradycardia  HPI: 63yo male with past medical history of atrial fibrillation, CHF, diabetes, HTN, and CAD presented to the ER yesterday morning with hyperglycemia and weakness in right hand and arm. Patient was confused and unable to give thourogh medical history. Of note his tox screen was positive for cannabinoids.   Review of Systems: No chest pain or shortness of breath.    Past Medical History:  Diagnosis Date  . Atrial fibrillation ([place])    per patient  . Cancer ([place])   . CHF (congestive heart failure) ([place])   . Chronic back pain   . Chronic leg pain   . Coronary artery disease   . Diabetes mellitus without complication ([place])   . Hypertension   . Neuropathy     Medications Prior to Admission  Medication Sig Dispense Refill  . ACCU-CHEK AVIVA PLUS test strip U ONCE TO BID UTD 100 each 0  . ACCU-CHEK SOFTCLIX LANCETS lancets TEST 1-2 XD 100 each 0  . acetaminophen (TYLENOL) 325 MG tablet Take 1 tablet (325 mg total) by mouth every 6 (six) hours as needed for mild pain (or Fever >/= 101). 30 tablet 0  . amitriptyline (ELAVIL) 50 MG tablet Take 1 tablet (50 mg total) by mouth at bedtime. 90 tablet 0  . apixaban (ELIQUIS) 5 MG TABS tablet Take 1 tablet (5 mg total) by mouth 2 (two) times daily. 60 tablet 0  . Blood Glucose Monitoring Suppl (ACCU-CHEK AVIVA PLUS) w/Device KIT U TO TEST ONCE TO BID 1 kit 1  . gabapentin (NEURONTIN) 100 MG capsule Take 1 capsule (100 mg total) by mouth 3 (three) times daily. 180 capsule 3  . insulin aspart (NOVOLOG) 100 UNIT/ML injection Inject 5 Units into the skin 3 (three) times daily before meals. May have insulin pens 100 mL 1  . insulin detemir (LEVEMIR) 100 UNIT/ML injection Inject 0.17 mLs (17 Units total) into the skin daily. 100 mL 1  . Multiple Vitamin (MULTIVITAMIN WITH MINERALS) TABS tablet Take 1 tablet  by mouth daily. 30 tablet 1  . Needles & Syringes MISC Use as directed 100 each 3  . traZODone (DESYREL) 50 MG tablet Take 1 tablet (50 mg total) by mouth at bedtime as needed for sleep. 90 tablet 0  . aspirin 81 MG chewable tablet Chew 1 tablet (81 mg total) by mouth daily. (Patient not taking: Reported on 11/11/2018) 30 tablet 0  . atorvastatin (LIPITOR) 40 MG tablet Take 1 tablet (40 mg total) by mouth every evening. (Patient not taking: Reported on 11/11/2018) 30 tablet 0  . insulin aspart (NOVOLOG) 100 UNIT/ML injection Inject 0-15 Units into the skin 3 (three) times daily with meals. Correction coverage: Moderate (average weight, post-op) CBG < 70: implement hypoglycemia protocol CBG 70 - 120: 0 units CBG 121 - 150: 2 units CBG 151 - 200: 3 units CBG 201 - 250: 5 units CBG 251 - 300: 8 units CBG 301 - 350: 11 units CBG 351 - 400: 15 units CBG > 400: call MD (Patient not taking: Reported on 11/11/2018) 10 mL 11  . insulin aspart (NOVOLOG) 100 UNIT/ML injection Inject 0-5 Units into the skin at bedtime. CBG < 70: implement hypoglycemia protocol CBG 70 - 120: 0 units CBG 121 - 150: 0 units CBG 151 - 200: 0 units CBG 201 - 250: 2 units CBG 251 - 300: 3 units  CBG 301 - 350: 4 units CBG 351 - 400: 5 units CBG > 400: call MD (Patient not taking: Reported on 11/11/2018) 10 mL 11  . metoprolol tartrate (LOPRESSOR) 25 MG tablet Take 1 tablet (25 mg total) by mouth 2 (two) times daily. (Patient not taking: Reported on 11/11/2018) 60 tablet 0     . aspirin  325 mg Oral Daily  . atorvastatin  40 mg Oral QPM  . insulin aspart  0-15 Units Subcutaneous TID WC  . insulin aspart  0-5 Units Subcutaneous QHS  . insulin detemir  10 Units Subcutaneous Daily  . multivitamin with minerals  1 tablet Oral Daily    Infusions: . heparin 900 Units/hr (11/12/18 1253)    No Known Allergies  Social History   Socioeconomic History  . Marital status: Single    Spouse name: Not on file  . Number of  children: Not on file  . Years of education: Not on file  . Highest education level: Not on file  Occupational History  . Not on file  Social Needs  . Financial resource strain: Not on file  . Food insecurity:    Worry: Not on file    Inability: Not on file  . Transportation needs:    Medical: Not on file    Non-medical: Not on file  Tobacco Use  . Smoking status: Current Every Day Smoker    Packs/day: 0.50  . Smokeless tobacco: Never Used  Substance and Sexual Activity  . Alcohol use: No    Alcohol/week: 0.0 standard drinks  . 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 Cocaine used over a year ago per pt.  . Sexual activity: Not on file  Lifestyle  . Physical activity:    Days per week: Not on file    Minutes per session: Not on file  . Stress: Not on file  Relationships  . Social connections:    Talks on phone: Not on file    Gets together: Not on file    Attends religious service: Not on file    Active member of club or organization: Not on file    Attends meetings of clubs or organizations: Not on file    Relationship status: Not on file  . Intimate partner violence:    Fear of current or ex partner: Not on file    Emotionally abused: Not on file    Physically abused: Not on file    Forced sexual activity: Not on file  Other Topics Concern  . Not on file  Social History Narrative  . Not on file    Family History  Problem Relation Age of Onset  . CAD Mother   . Diabetes Mellitus II Mother   . Diabetes Mellitus II Father     PHYSICAL EXAM: Vitals:   11/12/18 1100 11/12/18 1200  BP: 134/88 102/60  Pulse: 81 (!) 43  Resp: (!) 31 18  Temp:    SpO2: 100% 100%     Intake/Output Summary (Last 24 hours) at 11/12/2018 1352 Last data filed at 11/12/2018 1145 Gross per 24 hour  Intake 188.23 ml  Output 1375 ml  Net -1186.77 ml    General:  No acute distress HEENT: normal Neck: supple. no JVD. Carotids 2+ bilat; no bruits.  No lymphadenopathy or thryomegaly appreciated. Cor: PMI nondisplaced. Regular rate & rhythm. No rubs, gallops or murmurs. Lungs: clear Abdomen: soft, nontender, nondistended. No hepatosplenomegaly. No bruits  or masses. Good bowel sounds. Extremities: no cyanosis, clubbing, rash, edema Neuro: alert & oriented x 3, cranial nerves grossly intact. moves all 4 extremities w/o difficulty. Affect pleasant.  ECG: Sinus bradycardia 40bpm, Marked sinus bradycardia with Premature atrial complexes Left axis deviation Marked ST abnormality, possible inferior subendocardial injury Abnormal ECG  Results for orders placed or performed during the hospital encounter of 11/11/18 (from the past 24 hour(s))  Glucose, capillary     Status: Abnormal   Collection Time: 11/11/18  4:03 PM  Result Value Ref Range   Glucose-Capillary 100 (H) 70 - 99 mg/dL  Glucose, capillary     Status: Abnormal   Collection Time: 11/11/18  7:33 PM  Result Value Ref Range   Glucose-Capillary 182 (H) 70 - 99 mg/dL  Troponin I - Now Then Q6H     Status: Abnormal   Collection Time: 11/11/18  9:08 PM  Result Value Ref Range   Troponin I 0.03 (HH) <0.03 ng/mL  Basic metabolic panel     Status: Abnormal   Collection Time: 11/11/18  9:08 PM  Result Value Ref Range   Sodium 134 (L) 135 - 145 mmol/L   Potassium 3.3 (L) 3.5 - 5.1 mmol/L   Chloride 105 98 - 111 mmol/L   CO2 26 22 - 32 mmol/L   Glucose, Bld 154 (H) 70 - 99 mg/dL   BUN 8 8 - 23 mg/dL   Creatinine, Ser 0.71 0.61 - 1.24 mg/dL   Calcium 8.4 (L) 8.9 - 10.3 mg/dL   GFR calc non Af Amer >60 >60 mL/min   GFR calc Af Amer >60 >60 mL/min   Anion gap 3 (L) 5 - 15  Magnesium     Status: None   Collection Time: 11/11/18  9:08 PM  Result Value Ref Range   Magnesium 1.9 1.7 - 2.4 mg/dL  Glucose, capillary     Status: Abnormal   Collection Time: 11/11/18 11:15 PM  Result Value Ref Range   Glucose-Capillary 107 (H) 70 - 99 mg/dL  Basic metabolic panel     Status: Abnormal    Collection Time: 11/12/18  3:01 AM  Result Value Ref Range   Sodium 136 135 - 145 mmol/L   Potassium 3.4 (L) 3.5 - 5.1 mmol/L   Chloride 102 98 - 111 mmol/L   CO2 26 22 - 32 mmol/L   Glucose, Bld 170 (H) 70 - 99 mg/dL   BUN 8 8 - 23 mg/dL   Creatinine, Ser 0.70 0.61 - 1.24 mg/dL   Calcium 8.5 (L) 8.9 - 10.3 mg/dL   GFR calc non Af Amer >60 >60 mL/min   GFR calc Af Amer >60 >60 mL/min   Anion gap 8 5 - 15  CBC     Status: Abnormal   Collection Time: 11/12/18  3:01 AM  Result Value Ref Range   WBC 10.7 (H) 4.0 - 10.5 K/uL   RBC 3.93 (L) 4.22 - 5.81 MIL/uL   Hemoglobin 11.4 (L) 13.0 - 17.0 g/dL   HCT 35.3 (L) 39.0 - 52.0 %   MCV 89.8 80.0 - 100.0 fL   MCH 29.0 26.0 - 34.0 pg   MCHC 32.3 30.0 - 36.0 g/dL   RDW 12.9 11.5 - 15.5 %   Platelets 310 150 - 400 K/uL   nRBC 0.0 0.0 - 0.2 %  Procalcitonin     Status: None   Collection Time: 11/12/18  3:01 AM  Result Value Ref Range   Procalcitonin <0.10 ng/mL  Troponin  I - Now Then Q6H     Status: None   Collection Time: 11/12/18  3:01 AM  Result Value Ref Range   Troponin I <0.03 <0.03 ng/mL  Heparin level (unfractionated)     Status: Abnormal   Collection Time: 11/12/18  3:01 AM  Result Value Ref Range   Heparin Unfractionated 0.26 (L) 0.30 - 0.70 IU/mL  Glucose, capillary     Status: Abnormal   Collection Time: 11/12/18  3:26 AM  Result Value Ref Range   Glucose-Capillary 163 (H) 70 - 99 mg/dL  Glucose, capillary     Status: Abnormal   Collection Time: 11/12/18  7:42 AM  Result Value Ref Range   Glucose-Capillary 181 (H) 70 - 99 mg/dL  Troponin I - Now Then Q6H     Status: None   Collection Time: 11/12/18  9:55 AM  Result Value Ref Range   Troponin I <0.03 <0.03 ng/mL  Heparin level (unfractionated)     Status: Abnormal   Collection Time: 11/12/18  9:55 AM  Result Value Ref Range   Heparin Unfractionated 0.20 (L) 0.30 - 0.70 IU/mL  Glucose, capillary     Status: Abnormal   Collection Time: 11/12/18 11:50 AM  Result Value  Ref Range   Glucose-Capillary 133 (H) 70 - 99 mg/dL   Ct Head Wo Contrast  Result Date: 11/11/2018 CLINICAL DATA:  Altered level of consciousness EXAM: CT HEAD WITHOUT CONTRAST TECHNIQUE: Contiguous axial images were obtained from the base of the skull through the vertex without intravenous contrast. COMPARISON:  September 20, 2018 FINDINGS: Brain: No evidence of acute infarction, hemorrhage, hydrocephalus, extra-axial collection or mass lesion/mass effect. There is chronic diffuse atrophy. Chronic bilateral periventricular white matter small vessel ischemic changes noted. Vascular: No hyperdense vessel is noted. Skull: Normal. Negative for fracture or focal lesion. Sinuses/Orbits: Mucoperiosteal thickening of bilateral ethmoid, bilateral frontal and anterior left sphenoid sinus are noted. The orbits are normal. Other: None. IMPRESSION: No focal acute intracranial abnormality identified. Chronic diffuse atrophy. Chronic bilateral periventricular white matter small vessel ischemic change. Electronically Signed   By: Abelardo Diesel M.D.   On: 11/11/2018 07:32   Dg Chest Portable 1 View  Result Date: 11/11/2018 CLINICAL DATA:  63 year old male with right side weakness onset around midnight. Shortness of breath. EXAM: PORTABLE CHEST 1 VIEW COMPARISON:  Portable chest 09/20/2018 and earlier. FINDINGS: Portable AP upright view at 0606 hours. Lung volumes and mediastinal contours are within normal limits. Allowing for portable technique the lungs are clear. Visualized tracheal air column is within normal limits. No pneumothorax. No acute osseous abnormality identified. IMPRESSION: Negative.  No acute cardiopulmonary abnormality. Electronically Signed   By: Genevie Ann M.D.   On: 11/11/2018 06:45     ASSESSMENT AND PLAN: Paroxysmal atrial fibrillation/marked bradycardia: History of stress test 09/27/17, probably normal NST with normal LVEF. Echo pending reading.  Continue anticoagulation for paroxysmal Afib. Continue  to hold metoprolol.   Jake Bathe, NP-C Cell: [phone]

## 2018-11-12 NOTE — Progress Notes (Signed)
Patient arrived from ICU. AAOx4, VSS. Heparin infusing , placed on tele , ambulated to bathroom. Call bell within reach, bed in low position, bed alarm set.

## 2018-11-12 NOTE — Progress Notes (Signed)
ANTICOAGULATION CONSULT NOTE   Pharmacy Consult for Heparin  Indication: chest pain/ACS  No Known Allergies  Patient Measurements: Height: 5' 10"  (177.8 cm) Weight: 121 lb 0.5 oz (54.9 kg) IBW/kg (Calculated) : 73 Heparin Dosing Weight: 54.9 kg   Vital Signs: Temp: 98 F (36.7 C) (01/22 1400) Temp Source: Oral (01/22 1400) BP: 124/54 (01/22 1600) Pulse Rate: 122 (01/22 1600)  Labs: Recent Labs    11/11/18 0508 11/11/18 2108 11/12/18 0301 11/12/18 0955  HGB 12.4*  --  11.4*  --   HCT 37.9*  --  35.3*  --   PLT 358  --  310  --   APTT 31  --   --   --   LABPROT 14.4  --   --   --   INR 1.13  --   --   --   HEPARINUNFRC  --   --  0.26* 0.20*  CREATININE 1.04 0.71 0.70  --   TROPONINI <0.03 0.03* <0.03 <0.03    Estimated Creatinine Clearance: 73.4 mL/min (by C-G formula based on SCr of 0.7 mg/dL).   Medical History: Past Medical History:  Diagnosis Date  . Atrial fibrillation ([place])    per patient  . Cancer ([place])   . CHF (congestive heart failure) ([place])   . Chronic back pain   . Chronic leg pain   . Coronary artery disease   . Diabetes mellitus without complication ([place])   . Hypertension   . Neuropathy     Medications:  Medications Prior to Admission  Medication Sig Dispense Refill Last Dose  . ACCU-CHEK AVIVA PLUS test strip U ONCE TO BID UTD 100 each 0   . ACCU-CHEK SOFTCLIX LANCETS lancets TEST 1-2 XD 100 each 0   . acetaminophen (TYLENOL) 325 MG tablet Take 1 tablet (325 mg total) by mouth every 6 (six) hours as needed for mild pain (or Fever >/= 101). 30 tablet 0 prn at prn  . amitriptyline (ELAVIL) 50 MG tablet Take 1 tablet (50 mg total) by mouth at bedtime. 90 tablet 0 unknown at unknown  . apixaban (ELIQUIS) 5 MG TABS tablet Take 1 tablet (5 mg total) by mouth 2 (two) times daily. 60 tablet 0 unknown at unknown  . Blood Glucose Monitoring Suppl (ACCU-CHEK AVIVA PLUS) w/Device KIT U TO TEST ONCE TO BID 1 kit 1   . gabapentin (NEURONTIN) 100 MG  capsule Take 1 capsule (100 mg total) by mouth 3 (three) times daily. 180 capsule 3 unknown at unknown  . insulin aspart (NOVOLOG) 100 UNIT/ML injection Inject 5 Units into the skin 3 (three) times daily before meals. May have insulin pens 100 mL 1 unknown at unknown  . insulin detemir (LEVEMIR) 100 UNIT/ML injection Inject 0.17 mLs (17 Units total) into the skin daily. 100 mL 1 unknown at unknown  . Multiple Vitamin (MULTIVITAMIN WITH MINERALS) TABS tablet Take 1 tablet by mouth daily. 30 tablet 1 unknown at unknown  . Needles & Syringes MISC Use as directed 100 each 3   . traZODone (DESYREL) 50 MG tablet Take 1 tablet (50 mg total) by mouth at bedtime as needed for sleep. 90 tablet 0 unknown at unknown  . aspirin 81 MG chewable tablet Chew 1 tablet (81 mg total) by mouth daily. (Patient not taking: Reported on 11/11/2018) 30 tablet 0 Not Taking at Unknown time  . atorvastatin (LIPITOR) 40 MG tablet Take 1 tablet (40 mg total) by mouth every evening. (Patient not taking: Reported on 11/11/2018) 30 tablet  0 Not Taking at Unknown time  . insulin aspart (NOVOLOG) 100 UNIT/ML injection Inject 0-15 Units into the skin 3 (three) times daily with meals. Correction coverage: Moderate (average weight, post-op) CBG < 70: implement hypoglycemia protocol CBG 70 - 120: 0 units CBG 121 - 150: 2 units CBG 151 - 200: 3 units CBG 201 - 250: 5 units CBG 251 - 300: 8 units CBG 301 - 350: 11 units CBG 351 - 400: 15 units CBG > 400: call MD (Patient not taking: Reported on 11/11/2018) 10 mL 11 Not Taking at Unknown time  . insulin aspart (NOVOLOG) 100 UNIT/ML injection Inject 0-5 Units into the skin at bedtime. CBG < 70: implement hypoglycemia protocol CBG 70 - 120: 0 units CBG 121 - 150: 0 units CBG 151 - 200: 0 units CBG 201 - 250: 2 units CBG 251 - 300: 3 units CBG 301 - 350: 4 units CBG 351 - 400: 5 units CBG > 400: call MD (Patient not taking: Reported on 11/11/2018) 10 mL 11 Not Taking at Unknown time  .  metoprolol tartrate (LOPRESSOR) 25 MG tablet Take 1 tablet (25 mg total) by mouth 2 (two) times daily. (Patient not taking: Reported on 11/11/2018) 60 tablet 0 Not Taking at Unknown time    Assessment: Pharmacy consulted to dose heparin in this 63 year old male with ACS/NSTEMI. Patient receiving heparin 800 units/hr.   Goal of Therapy:  Heparin level 0.3-0.7 units/ml Monitor platelets by anticoagulation protocol: Yes   Plan:  Will order heparin bolus 900 units x 1 and increase rate to 900 units/hr. Will recheck heparin level at 1900.   Pharmacy will continue to monitor and adjust per consult.    Simpson,Michael L 11/12/2018,4:48 PM

## 2018-11-12 NOTE — Progress Notes (Signed)
Patient ID: Richard [name], male   DOB: Aug 07, 1956, 63 y.o.   MRN: [id number]  [facility] PROGRESS NOTE  Erinn Huskins IRS:[id number] DOB: 08-Mar-1956 DOA: 11/11/2018 PCP: [facility] [place]  HPI/Subjective: Patient complains of left foot pain.  Overnight had episodes of bradycardia and was in atrial fibrillation.  Patient with no complaints of chest pain or shortness of breath.  Mental status much improved from yesterday.  Sugars better controlled.  Objective: Vitals:   11/12/18 1000 11/12/18 1100  BP: 123/78 134/88  Pulse: 78 81  Resp: 17 (!) 31  Temp:    SpO2: 100% 100%    Filed Weights   11/11/18 0509  Weight: 54.9 kg    ROS: Review of Systems  Constitutional: Negative for chills and fever.  Eyes: Negative for blurred vision.  Respiratory: Negative for cough and shortness of breath.   Cardiovascular: Negative for chest pain.  Gastrointestinal: Negative for abdominal pain, constipation, diarrhea, nausea and vomiting.  Genitourinary: Negative for dysuria.  Musculoskeletal: Positive for joint pain.  Neurological: Negative for dizziness and headaches.   Exam: Physical Exam  HENT:  Nose: No mucosal edema.  Mouth/Throat: No oropharyngeal exudate or posterior oropharyngeal edema.  Eyes: Pupils are equal, round, and reactive to light. Conjunctivae, EOM and lids are normal.  Neck: No JVD present. Carotid bruit is not present. No edema present. No thyroid mass and no thyromegaly present.  Cardiovascular: S1 normal and S2 normal. An irregularly irregular rhythm present. Exam reveals no gallop.  No murmur heard. Pulses:      Dorsalis pedis pulses are 2+ on the right side and 0 on the left side.       Posterior tibial pulses are 0 on the left side.  Able to Doppler a weak pulse on the left side.  Respiratory: No respiratory distress. He has no wheezes. He has no rhonchi. He has no rales.  GI: Soft. Bowel sounds are normal. There is no abdominal tenderness.   Musculoskeletal:     Right ankle: He exhibits no swelling.     Left ankle: He exhibits no swelling.  Lymphadenopathy:    He has no cervical adenopathy.  Neurological: He is alert. No cranial nerve deficit.  Patient with difficulty moving his left foot.  Skin: No rash noted. Nails show no clubbing.  Left foot cool to touch.  Psychiatric: He has a normal mood and affect.      Data Reviewed: Basic Metabolic Panel: Recent Labs  Lab 11/11/18 0508 11/11/18 2108 11/12/18 0301  NA 129* 134* 136  K 4.3 3.3* 3.4*  CL 94* 105 102  CO2 26 26 26   GLUCOSE 755* 154* 170*  BUN 11 8 8   CREATININE 1.04 0.71 0.70  CALCIUM 9.5 8.4* 8.5*  MG  --  1.9  --    Liver Function Tests: Recent Labs  Lab 11/11/18 0508  AST 17  ALT 13  ALKPHOS 31*  BILITOT 0.7  PROT 7.4  ALBUMIN 3.8   Recent Labs  Lab 11/11/18 0508  LIPASE 28   CBC: Recent Labs  Lab 11/11/18 0508 11/12/18 0301  WBC 7.1 10.7*  NEUTROABS 3.3  --   HGB 12.4* 11.4*  HCT 37.9* 35.3*  MCV 90.5 89.8  PLT 358 310   Cardiac Enzymes: Recent Labs  Lab 11/11/18 0508 11/11/18 2108 11/12/18 0301 11/12/18 0955  TROPONINI <0.03 0.03* <0.03 <0.03   BNP (last 3 results) Recent Labs    01/09/18 0404  BNP 53.0     CBG:  Recent Labs  Lab 11/11/18 1933 11/11/18 2315 11/12/18 0326 11/12/18 0742 11/12/18 1150  GLUCAP 182* 107* 163* 181* 133*    Recent Results (from the past 240 hour(s))  MRSA PCR Screening     Status: None   Collection Time: 11/11/18  9:09 AM  Result Value Ref Range Status   MRSA by PCR NEGATIVE NEGATIVE Final    Comment:        The GeneXpert MRSA Assay (FDA approved for NASAL specimens only), is one component of a comprehensive MRSA colonization surveillance program. It is not intended to diagnose MRSA infection nor to guide or monitor treatment for MRSA infections. Performed at [facility], [address] [place], [place] [address]   Culture, blood (Routine X 2) w  Reflex to ID Panel     Status: None (Preliminary result)   Collection Time: 11/11/18 12:38 PM  Result Value Ref Range Status   Specimen Description BLOOD BLOOD RIGHT HAND  Final   Special Requests   Final    BOTTLES DRAWN AEROBIC AND ANAEROBIC Blood Culture adequate volume   Culture   Final    NO GROWTH < 24 HOURS Performed at [facility], [address] [place], [place] [address]    Report Status PENDING  Incomplete  Culture, blood (Routine X 2) w Reflex to ID Panel     Status: None (Preliminary result)   Collection Time: 11/11/18 12:47 PM  Result Value Ref Range Status   Specimen Description BLOOD BLOOD RIGHT HAND  Final   Special Requests   Final    BOTTLES DRAWN AEROBIC AND ANAEROBIC Blood Culture results may not be optimal due to an excessive volume of blood received in culture bottles   Culture   Final    NO GROWTH < 24 HOURS Performed at [facility], [address] [place], [place] [address]    Report Status PENDING  Incomplete     Studies: Ct Head Wo Contrast  Result Date: 11/11/2018 CLINICAL DATA:  Altered level of consciousness EXAM: CT HEAD WITHOUT CONTRAST TECHNIQUE: Contiguous axial images were obtained from the base of the skull through the vertex without intravenous contrast. COMPARISON:  September 20, 2018 FINDINGS: Brain: No evidence of acute infarction, hemorrhage, hydrocephalus, extra-axial collection or mass lesion/mass effect. There is chronic diffuse atrophy. Chronic bilateral periventricular white matter small vessel ischemic changes noted. Vascular: No hyperdense vessel is noted. Skull: Normal. Negative for fracture or focal lesion. Sinuses/Orbits: Mucoperiosteal thickening of bilateral ethmoid, bilateral frontal and anterior left sphenoid sinus are noted. The orbits are normal. Other: None. IMPRESSION: No focal acute intracranial abnormality identified. Chronic diffuse atrophy. Chronic bilateral periventricular white matter small vessel  ischemic change. Electronically Signed   By: Abelardo Diesel M.D.   On: 11/11/2018 07:32   Dg Chest Portable 1 View  Result Date: 11/11/2018 CLINICAL DATA:  63 year old male with right side weakness onset around midnight. Shortness of breath. EXAM: PORTABLE CHEST 1 VIEW COMPARISON:  Portable chest 09/20/2018 and earlier. FINDINGS: Portable AP upright view at 0606 hours. Lung volumes and mediastinal contours are within normal limits. Allowing for portable technique the lungs are clear. Visualized tracheal air column is within normal limits. No pneumothorax. No acute osseous abnormality identified. IMPRESSION: Negative.  No acute cardiopulmonary abnormality. Electronically Signed   By: Genevie Ann M.D.   On: 11/11/2018 06:45    Scheduled Meds: . aspirin  325 mg Oral Daily  . atorvastatin  40 mg Oral QPM  . insulin aspart  0-15 Units Subcutaneous TID WC  . insulin aspart  0-5 Units Subcutaneous QHS  . insulin detemir  10 Units Subcutaneous Daily  . multivitamin with minerals  1 tablet Oral Daily   Continuous Infusions: . heparin 800 Units/hr (11/12/18 0900)    Assessment/Plan:  1. Bradycardia, EKG changes.  Metoprolol on hold.  Patient on dopamine drip and heart rate in the 70s.  Cardiology to see.  Echocardiogram pending.  Patient has no complaints of chest pain or shortness of breath. 2. Left foot pain and coolness with difficulty palpating pulses.  Patient on heparin drip.  Case discussed with critical care specialist and they will put in a consult for vascular surgery to see. 3. Atrial fibrillation.  Eliquis on hold and patient on heparin drip. 4. Diabetes with hyperosmolar coma.  This has improved.  Mental status improved.  Patient on detemir insulin and aspart insulin.  Hemoglobin A1c added on. 5. Acute encephalopathy this has resolved 6. Hyponatremia.  Has improved with IV fluids. 7. Lactic acidosis likely with dehydration and hyperosmolar coma.  Procalcitonin negative. 8. History of CAD.   Metoprolol on hold.  Patient on aspirin and statin. 9. History of pancreatic cancer status post Whipple procedure.  Code Status:     Code Status Orders  (From admission, onward)         Start     Ordered   11/11/18 0758  Do not attempt resuscitation (DNR)  Continuous    Question Answer Comment  In the event of cardiac or respiratory ARREST Do not call a "code blue"   In the event of cardiac or respiratory ARREST Do not perform Intubation, CPR, defibrillation or ACLS   In the event of cardiac or respiratory ARREST Use medication by any route, position, wound care, and other measures to relive pain and suffering. May use oxygen, suction and manual treatment of airway obstruction as needed for comfort.   Comments nurse may pronounce      11/11/18 0758        Code Status History    Date Active Date Inactive Code Status Order ID Comments User Context   09/20/2018 1635 09/24/2018 1854 Full Code [id number]  Fritzi Mandes, MD ED   08/03/2018 1020 08/04/2018 1813 Full Code [id number]  Gorden Harms, MD ED   01/09/2018 1025 01/11/2018 1932 Full Code [id number]  Harrie Foreman, MD Inpatient   09/05/2017 1559 09/11/2017 1928 Full Code [id number]  Fritzi Mandes, MD Inpatient   08/24/2017 0238 08/29/2017 2131 Full Code [id number]  Lance Coon, MD Inpatient   08/10/2015 0822 08/11/2015 1947 Full Code [id number]  Harrie Foreman, MD Inpatient     Family Communication: As per critical care team Disposition Plan: To be determined  Consultants:  Critical care specialist  Cardiology  Vascular surgery  Time spent: 28 minutes in coordination of care speaking with nurse and critical care team.  [place]

## 2018-11-12 NOTE — Progress Notes (Addendum)
CRITICAL CARE NOTE  CC  Follow up altered mental status   SUBJECTIVE Patient alert oriented and awake this morning, reports not remembering what happened after he arrived at ED. Last thing he remembered was waking up in AM with numbness and stiffness in his R arm and thought he had a stroke, and his wife called EMS. Pt reports taking his home medications and insulin daily, but did not take his insulin yesterday. He reports having L foot stabbing pain started 2 days ago, and has numbness in his L toes.    SIGNIFICANT EVENTS/STUDIES 1/21 - Arrived at ED, AMS 1/21 - Obtained CT head w/o contrast, Chest X-ray, CBG 755, lactic acid 4.5 1/21 - Transferred to ICU Stepdown. CBG trends down to 206, lactic acid trends up to 5.4 1/21 - D/c'd Precedex at [facility] 1/22 - Pt alert and awake   BP 134/88   Pulse 81   Temp 98.3 F (36.8 C) (Oral)   Resp (!) 31   Ht 5\' 10"  (1.778 m)   Wt 54.9 kg   SpO2 100%   BMI 17.37 kg/m    REVIEW OF SYSTEMS Review of Systems  Constitutional: Negative for chills and fever.  Eyes: Negative for blurred vision.  Respiratory: Negative for cough, shortness of breath and wheezing.   Cardiovascular: Negative for chest pain, palpitations and leg swelling.  Gastrointestinal: Negative for abdominal pain.  Neurological: Positive for dizziness, sensory change (Numbness in L toes; stabbing pain under L foot) and headaches (9/10).  All other systems reviewed and are negative.    PHYSICAL EXAMINATION:  GENERAL: In no acute distress.  HEAD: Normocephalic, atraumatic.  EYES: Pupils equal, round, reactive to light.  No scleral icterus.  MOUTH: Moist mucosal membrane. NECK: Supple. No thyromegaly. No nodules. No JVD.  PULMONARY: Clear lung sound bilaterally CARDIOVASCULAR: S1 and S2. Regular rate and rhythm. No murmurs, rubs, or gallops. Weak L posterior tibial pulse and pedal pulse GASTROINTESTINAL: Soft, nontender, -distended. No masses. Positive bowel sounds. No  hepatosplenomegaly.  MUSCULOSKELETAL: No swelling, clubbing, or edema. R transmetatarsal amputation.  NEUROLOGIC: Awake, alert, oriented. No sensory on L toes except big toe. Only able to move L big toe but not the rest. SKIN: Intact, dry. Cool to touch below L ankle   ASSESSMENT AND PLAN  Patient is a 63 y.o. male presents to the ICU Stepdown with altered mental status and agitation on hyperglycemia and elevated lactic acid. Resolving   Acute Encephalopathy with agitated confusion - Improved  Diabetes with Hyperosmolar coma - Improved Pt awake and alert Continue insulin Follow up CBC, BMP  Left foot pain and coolness with weak pulses Consult vascular surgery  Bradycardia Hold metoprolol On dopamine Pending ECHO  GI/Nutrition GI PROPHYLAXIS as indicated DIET-->heart healthy diet Constipation protocol as indicated  ELECTROLYTES Follow labs as needed Replace as needed Pharmacy consultation and following  Patient is DO NOT RESUSCITATED, ICU Stepdown   Jo-ku Mervyn Skeeters, PA-Student   Merton Border, MD PCCM service Mobile [phone] Pager [phone] 11/12/2018 5:12 PM

## 2018-11-12 NOTE — Progress Notes (Signed)
ANTICOAGULATION CONSULT NOTE   Pharmacy Consult for Heparin  Indication: chest pain/ACS  No Known Allergies  Patient Measurements: Height: 5' 10"  (177.8 cm) Weight: 121 lb 0.5 oz (54.9 kg) IBW/kg (Calculated) : 73 Heparin Dosing Weight: 54.9 kg   Vital Signs: Temp: 98 F (36.7 C) (01/22 1812) Temp Source: Oral (01/22 1812) BP: 109/70 (01/22 1812) Pulse Rate: 75 (01/22 1812)  Labs: Recent Labs    11/11/18 0508 11/11/18 2108 11/12/18 0301 11/12/18 0955 11/12/18 1856  HGB 12.4*  --  11.4*  --   --   HCT 37.9*  --  35.3*  --   --   PLT 358  --  310  --   --   APTT 31  --   --   --   --   LABPROT 14.4  --   --   --   --   INR 1.13  --   --   --   --   HEPARINUNFRC  --   --  0.26* 0.20* 0.28*  CREATININE 1.04 0.71 0.70  --   --   TROPONINI <0.03 0.03* <0.03 <0.03  --     Estimated Creatinine Clearance: 73.4 mL/min (by C-G formula based on SCr of 0.7 mg/dL).   Medical History: Past Medical History:  Diagnosis Date  . Atrial fibrillation ([place])    per patient  . Cancer ([place])   . CHF (congestive heart failure) ([place])   . Chronic back pain   . Chronic leg pain   . Coronary artery disease   . Diabetes mellitus without complication ([place])   . Hypertension   . Neuropathy     Medications:  Medications Prior to Admission  Medication Sig Dispense Refill Last Dose  . ACCU-CHEK AVIVA PLUS test strip U ONCE TO BID UTD 100 each 0   . ACCU-CHEK SOFTCLIX LANCETS lancets TEST 1-2 XD 100 each 0   . acetaminophen (TYLENOL) 325 MG tablet Take 1 tablet (325 mg total) by mouth every 6 (six) hours as needed for mild pain (or Fever >/= 101). 30 tablet 0 prn at prn  . amitriptyline (ELAVIL) 50 MG tablet Take 1 tablet (50 mg total) by mouth at bedtime. 90 tablet 0 unknown at unknown  . apixaban (ELIQUIS) 5 MG TABS tablet Take 1 tablet (5 mg total) by mouth 2 (two) times daily. 60 tablet 0 unknown at unknown  . Blood Glucose Monitoring Suppl (ACCU-CHEK AVIVA PLUS) w/Device KIT U TO TEST  ONCE TO BID 1 kit 1   . gabapentin (NEURONTIN) 100 MG capsule Take 1 capsule (100 mg total) by mouth 3 (three) times daily. 180 capsule 3 unknown at unknown  . insulin aspart (NOVOLOG) 100 UNIT/ML injection Inject 5 Units into the skin 3 (three) times daily before meals. May have insulin pens 100 mL 1 unknown at unknown  . insulin detemir (LEVEMIR) 100 UNIT/ML injection Inject 0.17 mLs (17 Units total) into the skin daily. 100 mL 1 unknown at unknown  . Multiple Vitamin (MULTIVITAMIN WITH MINERALS) TABS tablet Take 1 tablet by mouth daily. 30 tablet 1 unknown at unknown  . Needles & Syringes MISC Use as directed 100 each 3   . traZODone (DESYREL) 50 MG tablet Take 1 tablet (50 mg total) by mouth at bedtime as needed for sleep. 90 tablet 0 unknown at unknown  . aspirin 81 MG chewable tablet Chew 1 tablet (81 mg total) by mouth daily. (Patient not taking: Reported on 11/11/2018) 30 tablet 0 Not Taking at  Unknown time  . atorvastatin (LIPITOR) 40 MG tablet Take 1 tablet (40 mg total) by mouth every evening. (Patient not taking: Reported on 11/11/2018) 30 tablet 0 Not Taking at Unknown time  . insulin aspart (NOVOLOG) 100 UNIT/ML injection Inject 0-15 Units into the skin 3 (three) times daily with meals. Correction coverage: Moderate (average weight, post-op) CBG < 70: implement hypoglycemia protocol CBG 70 - 120: 0 units CBG 121 - 150: 2 units CBG 151 - 200: 3 units CBG 201 - 250: 5 units CBG 251 - 300: 8 units CBG 301 - 350: 11 units CBG 351 - 400: 15 units CBG > 400: call MD (Patient not taking: Reported on 11/11/2018) 10 mL 11 Not Taking at Unknown time  . insulin aspart (NOVOLOG) 100 UNIT/ML injection Inject 0-5 Units into the skin at bedtime. CBG < 70: implement hypoglycemia protocol CBG 70 - 120: 0 units CBG 121 - 150: 0 units CBG 151 - 200: 0 units CBG 201 - 250: 2 units CBG 251 - 300: 3 units CBG 301 - 350: 4 units CBG 351 - 400: 5 units CBG > 400: call MD (Patient not taking: Reported  on 11/11/2018) 10 mL 11 Not Taking at Unknown time  . metoprolol tartrate (LOPRESSOR) 25 MG tablet Take 1 tablet (25 mg total) by mouth 2 (two) times daily. (Patient not taking: Reported on 11/11/2018) 60 tablet 0 Not Taking at Unknown time    Assessment: Pharmacy consulted to dose heparin in this 63 year old male with ACS/NSTEMI. Patient receiving heparin 800 units/hr.   Goal of Therapy:  Heparin level 0.3-0.7 units/ml Monitor platelets by anticoagulation protocol: Yes   Plan:  11/12/2018 18:56 HL subtherapeutic. Rate was increased to 900 units/hr today at 12:53. Give 850 units IV x 1 bolus and increase rate to 1000 units/hr. Recheck HL in 6 hours.  Pharmacy will continue to monitor and adjust per consult.    Laural Benes, PharmD, BCPS Clinical Pharmacist 11/12/2018,7:26 PM

## 2018-11-12 NOTE — Progress Notes (Signed)
850 Heparin bolus infused and rate changed to 10units/hr

## 2018-11-12 NOTE — Progress Notes (Signed)
Initial Nutrition Assessment  DOCUMENTATION CODES:   Severe malnutrition in context of chronic illness, Underweight  INTERVENTION:  Recommend liberalizing diet to just carbohydrate modified as patient is malnourished.  Provide Ensure Enlive po BID, each supplement provides 350 kcal and 20 grams of protein. Patient prefers chocolate.  Continue daily MVI.  Encouraged more consistent intake throughout the day as patient is on a fixed insulin dose with meals + ACHS sliding scale. Encouraged adequate protein intake at meals and snacks.  NUTRITION DIAGNOSIS:   Severe Malnutrition related to chronic illness(hx pancreatic cancer s/p Whipple procedure) as evidenced by severe fat depletion, moderate-severe muscle depletion.  GOAL:   Patient will meet greater than or equal to 90% of their needs  MONITOR:   PO intake, Supplement acceptance, Labs, Weight trends, I & O's  REASON FOR ASSESSMENT:   Other (Comment)(Low BMI)    ASSESSMENT:   63 year old male with PMHx of HTN, DM, CAD, neuropathy, chronic back pain, chronic leg pain, CHF, A-fib, hx pancreatic cancer s/p Whipple procedure who is admitted with hyperosmolar coma, acute encephalopathy, left foot pain and coolness, bradycardia.   Met with patient at bedside. He reports he has had a poor appetite for years now. At home he only eats one meal per day now. He may have spaghetti or "another normal dinner" per his report. He eats a small amount at that meal. He denies having any other snacks throughout the day. This is concerning because patient is on an insulin sliding scale ACHS plus an additional 5 units of insulin TID before meals. Patient reports to this RD that he was still taking the 5 units before "breakfast" and "lunch" even though he was not having anything to eat. He denies having hypoglycemia events. He drinks Premier Protein occasionally at home. He is amenable to drinking a higher-calorie ONS since his intake is so low at this  time. He is also amenable to adding in some small meals at breakfast and lunch for more consistent intake.   Patient reports his UBW is around 200 lbs. He thought he had lost down to around 139 lbs. Current weight is 54.9 kg (121.03 lbs). Per chart he was around 185 lbs in 11/2016, so weight loss has occurred over a few years.  Medications reviewed and include: Novolog 0-15 units TID, Novolog 0-5 units QHS, Levemir 10 units daily, MVI daily, heparin gtt.  Labs reviewed: CBG 133-252, Potassium 3.4.  Discussed with RN and on rounds.  NUTRITION - FOCUSED PHYSICAL EXAM:    Most Recent Value  Orbital Region  Severe depletion  Upper Arm Region  Severe depletion  Thoracic and Lumbar Region  Moderate depletion  Buccal Region  Severe depletion  Temple Region  Severe depletion  Clavicle Bone Region  Moderate depletion  Clavicle and Acromion Bone Region  Severe depletion  Scapular Bone Region  Moderate depletion  Dorsal Hand  Severe depletion  Patellar Region  Moderate depletion  Anterior Thigh Region  Moderate depletion  Posterior Calf Region  Moderate depletion  Edema (RD Assessment)  None  Hair  Reviewed  Eyes  Reviewed  Mouth  Reviewed  Skin  Reviewed  Nails  Reviewed     Diet Order:   Diet Order            Diet heart healthy/carb modified Room service appropriate? Yes; Fluid consistency: Thin  Diet effective now             EDUCATION NEEDS:   Education needs have been  addressed  Skin:  Skin Assessment: Reviewed RN Assessment  Last BM:  11/11/2018 per chart  Height:   Ht Readings from Last 1 Encounters:  11/11/18 5' 10"  (1.778 m)   Weight:   Wt Readings from Last 1 Encounters:  11/11/18 54.9 kg   Ideal Body Weight:  75.5 kg  BMI:  Body mass index is 17.37 kg/m.  Estimated Nutritional Needs:   Kcal:  1760-2030 (MSJ x 1.3-1.5)  Protein:  80-90 grams (1.5-1.6 grams/kg)  Fluid:  1.7-2 L/day (1 mL/kcal)  Willey Blade, MS, RD, LDN Office:  [phone] Pager: [phone] After Hours/Weekend Pager: [phone]

## 2018-11-12 NOTE — Progress Notes (Signed)
*  PRELIMINARY RESULTS* Echocardiogram 2D Echocardiogram has been performed.  Richard [name] 11/12/2018, 11:02 AM

## 2018-11-12 NOTE — Progress Notes (Signed)
ANTICOAGULATION CONSULT NOTE - Initial Consult  Pharmacy Consult for Heparin  Indication: chest pain/ACS  No Known Allergies  Patient Measurements: Height: _0  (177.8 cm) Weight: 121 lb 0.5 oz (54.9 kg) IBW/kg (Calculated) : 73 Heparin Dosing Weight: 54.9 kg   Vital Signs: Temp: 99.1 F (37.3 C) (01/22 0200) Temp Source: Oral (01/22 0200) BP: 115/83 (01/22 0330) Pulse Rate: 77 (01/22 0330)  Labs: Recent Labs    11/11/18 0508 11/11/18 2108 11/12/18 0301  HGB 12.4*  --  11.4*  HCT 37.9*  --  35.3*  PLT 358  --  310  APTT 31  --   --   LABPROT 14.4  --   --   INR 1.13  --   --   HEPARINUNFRC  --   --  0.26*  CREATININE 1.04 0.71  --   TROPONINI <0.03 0.03*  --     Estimated Creatinine Clearance: 73.4 mL/min (by C-G formula based on SCr of 0.71 mg/dL).   Medical History: Past Medical History:  Diagnosis Date  . Atrial fibrillation ([place])    per patient  . Cancer ([place])   . CHF (congestive heart failure) ([place])   . Chronic back pain   . Chronic leg pain   . Coronary artery disease   . Diabetes mellitus without complication ([place])   . Hypertension   . Neuropathy     Medications:  Medications Prior to Admission  Medication Sig Dispense Refill Last Dose  . ACCU-CHEK AVIVA PLUS test strip U ONCE TO BID UTD 100 each 0   . ACCU-CHEK SOFTCLIX LANCETS lancets TEST 1-2 XD 100 each 0   . acetaminophen (TYLENOL) 325 MG tablet Take 1 tablet (325 mg total) by mouth every 6 (six) hours as needed for mild pain (or Fever >/= 101). 30 tablet 0 prn at prn  . amitriptyline (ELAVIL) 50 MG tablet Take 1 tablet (50 mg total) by mouth at bedtime. 90 tablet 0 unknown at unknown  . apixaban (ELIQUIS) 5 MG TABS tablet Take 1 tablet (5 mg total) by mouth 2 (two) times daily. 60 tablet 0 unknown at unknown  . Blood Glucose Monitoring Suppl (ACCU-CHEK AVIVA PLUS) w/Device KIT U TO TEST ONCE TO BID 1 kit 1   . gabapentin (NEURONTIN) 100 MG capsule Take 1 capsule (100 mg total) by mouth 3  (three) times daily. 180 capsule 3 unknown at unknown  . insulin aspart (NOVOLOG) 100 UNIT/ML injection Inject 5 Units into the skin 3 (three) times daily before meals. May have insulin pens 100 mL 1 unknown at unknown  . insulin detemir (LEVEMIR) 100 UNIT/ML injection Inject 0.17 mLs (17 Units total) into the skin daily. 100 mL 1 unknown at unknown  . Multiple Vitamin (MULTIVITAMIN WITH MINERALS) TABS tablet Take 1 tablet by mouth daily. 30 tablet 1 unknown at unknown  . Needles & Syringes MISC Use as directed 100 each 3   . traZODone (DESYREL) 50 MG tablet Take 1 tablet (50 mg total) by mouth at bedtime as needed for sleep. 90 tablet 0 unknown at unknown  . aspirin 81 MG chewable tablet Chew 1 tablet (81 mg total) by mouth daily. (Patient not taking: Reported on 11/11/2018) 30 tablet 0 Not Taking at Unknown time  . atorvastatin (LIPITOR) 40 MG tablet Take 1 tablet (40 mg total) by mouth every evening. (Patient not taking: Reported on 11/11/2018) 30 tablet 0 Not Taking at Unknown time  . insulin aspart (NOVOLOG) 100 UNIT/ML injection Inject 0-15 Units into the  skin 3 (three) times daily with meals. Correction coverage: Moderate (average weight, post-op) CBG < 70: implement hypoglycemia protocol CBG 70 - 120: 0 units CBG 121 - 150: 2 units CBG 151 - 200: 3 units CBG 201 - 250: 5 units CBG 251 - 300: 8 units CBG 301 - 350: 11 units CBG 351 - 400: 15 units CBG > 400: call MD (Patient not taking: Reported on 11/11/2018) 10 mL 11 Not Taking at Unknown time  . insulin aspart (NOVOLOG) 100 UNIT/ML injection Inject 0-5 Units into the skin at bedtime. CBG < 70: implement hypoglycemia protocol CBG 70 - 120: 0 units CBG 121 - 150: 0 units CBG 151 - 200: 0 units CBG 201 - 250: 2 units CBG 251 - 300: 3 units CBG 301 - 350: 4 units CBG 351 - 400: 5 units CBG > 400: call MD (Patient not taking: Reported on 11/11/2018) 10 mL 11 Not Taking at Unknown time  . metoprolol tartrate (LOPRESSOR) 25 MG tablet Take 1  tablet (25 mg total) by mouth 2 (two) times daily. (Patient not taking: Reported on 11/11/2018) 60 tablet 0 Not Taking at Unknown time    Assessment: Pharmacy consulted to dose heparin in this 63 year old male with ACS/NSTEMI. CrCl = 56.6 ml/min,  No prior anticoag noted.   Goal of Therapy:  Heparin level 0.3-0.7 units/ml Monitor platelets by anticoagulation protocol: Yes   Plan:  Give 3300 units bolus x 1 Start heparin infusion at 700 units/hr Check anti-Xa level in 6 hours and daily while on heparin Continue to monitor H&H and platelets   1/22 0300 heparin level 0.26. 800 unit bolus and increase rate to 800 units/hr. Recheck in 6 hours.  Edie Darley S 11/12/2018,3:54 AM

## 2018-11-12 NOTE — Progress Notes (Signed)
PULMONARY/CCM PROGRESS NOTE  SUBJ: Off insulin gtt now. Last night, had CP and bradycardia requiring DC of dexmedetomidine and initiation of dopamine. Now, off dopamine with NSR. C/O L foot pain. Has hx of PVD  OBJ: Vitals:   11/12/18 1300 11/12/18 1400 11/12/18 1500 11/12/18 1600  BP:  (!) 115/59 137/72 (!) 124/54  Pulse: 76 (!) 41 75 (!) 122  Resp: (!) 21 (!) 21 18 19   Temp:  98 F (36.7 C)    TempSrc:  Oral    SpO2: 100% 100% 100% 100%  Weight:      Height:        Gen: NAD HEENT: NCAT, sclerae white Neck: No LAN, no JVD noted Lungs: clear Cardiovascular:  Abdomen: Soft, NT, +BS Ext: R transmetatarsal, L foot cool without palpable pulse Neuro: PERRL, EOMI, motor/sensory grossly intact Skin: No lesions noted   BMP Latest Ref Rng & Units 11/12/2018 11/11/2018 11/11/2018  Glucose 70 - 99 mg/dL 170(H) 154(H) 755(HH)  BUN 8 - 23 mg/dL 8 8 11   Creatinine 0.61 - 1.24 mg/dL 0.70 0.71 1.04  Sodium 135 - 145 mmol/L 136 134(L) 129(L)  Potassium 3.5 - 5.1 mmol/L 3.4(L) 3.3(L) 4.3  Chloride 98 - 111 mmol/L 102 105 94(L)  CO2 22 - 32 mmol/L 26 26 26   Calcium 8.9 - 10.3 mg/dL 8.5(L) 8.4(L) 9.5    Hepatic Function Latest Ref Rng & Units 11/11/2018 09/20/2018 08/03/2018  Total Protein 6.5 - 8.1 g/dL 7.4 6.9 6.9  Albumin 3.5 - 5.0 g/dL 3.8 3.8 3.6  AST 15 - 41 U/L 17 22 22   ALT 0 - 44 U/L 13 16 14   Alk Phosphatase 38 - 126 U/L 31(L) 26(L) 29(L)  Total Bilirubin 0.3 - 1.2 mg/dL 0.7 0.9 0.5  Bilirubin, Direct 0.0 - 0.2 mg/dL - - <0.1    CBC Latest Ref Rng & Units 11/12/2018 11/11/2018 09/20/2018  WBC 4.0 - 10.5 K/uL 10.7(H) 7.1 7.7  Hemoglobin 13.0 - 17.0 g/dL 11.4(L) 12.4(L) 11.2(L)  Hematocrit 39.0 - 52.0 % 35.3(L) 37.9(L) 33.9(L)  Platelets 150 - 400 K/uL 310 358 321      ABG    Component Value Date/Time   HCO3 25.0 11/11/2018 0602   TCO2 29 09/20/2018 1343   ACIDBASEDEF 14.5 (H) 09/05/2017 1149   O2SAT 38.3 11/11/2018 0602    CXR: No new  film   IMPRESSION/PLAN: Acute encephalopathy, resolved Severe hyperglycemia, hyperosmolality, resolved Bradycardia, concern for inferior wall ischemia  Cardiology Humphrey Rolls) consultation requested  Metoprolol DC's PVD with cold L foot  Vas Surg consultation requested  Transfer to [place] floor with cardiac monitoring. PCCM will s/o. Please call as needed  Merton Border, MD PCCM service Mobile [phone] Pager [phone] 11/12/2018 4:39 PM

## 2018-11-13 ENCOUNTER — Encounter
Admission: EM | Disposition: A | Discharge status: Home / Self Care | Payer: Self-pay | Source: Home / Self Care | Attending: Internal Medicine

## 2018-11-13 LAB — URIC ACID: Uric Acid, Serum: 3.6 mg/dL — ABNORMAL LOW (ref 3.7–8.6)

## 2018-11-13 LAB — ECHOCARDIOGRAM COMPLETE
Height: 70 in
Weight: 1936.52 oz

## 2018-11-13 LAB — CBC
HCT: 31.2 % — ABNORMAL LOW (ref 39.0–52.0)
Hemoglobin: 10.3 g/dL — ABNORMAL LOW (ref 13.0–17.0)
MCH: 29.3 pg (ref 26.0–34.0)
MCHC: 33 g/dL (ref 30.0–36.0)
MCV: 88.6 fL (ref 80.0–100.0)
Platelets: 285 10*3/uL (ref 150–400)
RBC: 3.52 MIL/uL — AB (ref 4.22–5.81)
RDW: 13.1 % (ref 11.5–15.5)
WBC: 7.6 10*3/uL (ref 4.0–10.5)
nRBC: 0 % (ref 0.0–0.2)

## 2018-11-13 LAB — HEPARIN LEVEL (UNFRACTIONATED)
HEPARIN UNFRACTIONATED: 0.5 [IU]/mL (ref 0.30–0.70)
HEPARIN UNFRACTIONATED: 0.58 [IU]/mL (ref 0.30–0.70)
Heparin Unfractionated: 0.27 IU/mL — ABNORMAL LOW (ref 0.30–0.70)

## 2018-11-13 LAB — BASIC METABOLIC PANEL
Anion gap: 4 — ABNORMAL LOW (ref 5–15)
BUN: 16 mg/dL (ref 8–23)
CO2: 26 mmol/L (ref 22–32)
Calcium: 8 mg/dL — ABNORMAL LOW (ref 8.9–10.3)
Chloride: 104 mmol/L (ref 98–111)
Creatinine, Ser: 0.78 mg/dL (ref 0.61–1.24)
GFR calc Af Amer: >60 mL/min (ref >60)
GFR calc non Af Amer: >60 mL/min (ref >60)
Glucose, Bld: 196 mg/dL — ABNORMAL HIGH (ref 70–99)
Potassium: 3.5 mmol/L (ref 3.5–5.1)
Sodium: 134 mmol/L — ABNORMAL LOW (ref 135–145)

## 2018-11-13 LAB — GLUCOSE, CAPILLARY
GLUCOSE-CAPILLARY: 208 mg/dL — AB (ref 70–99)
Glucose-Capillary: 213 mg/dL — ABNORMAL HIGH (ref 70–99)
Glucose-Capillary: 122 mg/dL — ABNORMAL HIGH (ref 70–99)
Glucose-Capillary: 141 mg/dL — ABNORMAL HIGH (ref 70–99)

## 2018-11-13 LAB — PROCALCITONIN: Procalcitonin: <0.1 ng/mL

## 2018-11-13 LAB — HEMOGLOBIN: Hemoglobin: 10.3 g/dL — ABNORMAL LOW (ref 13.0–17.0)

## 2018-11-13 SURGERY — LOWER EXTREMITY INTERVENTION
Anesthesia: Moderate Sedation | Laterality: Left

## 2018-11-13 MED ORDER — MELOXICAM 7.5 MG PO TABS
15.0000 mg | ORAL_TABLET | Freq: Every day | ORAL | Status: DC
  Administered 2018-11-13 – 2018-11-18 (×4): 15 mg via ORAL
  Filled 2018-11-13 (×6): qty 2

## 2018-11-13 MED ORDER — INSULIN ASPART 100 UNIT/ML ~~LOC~~ SOLN
0.0000 [IU] | Freq: Three times a day (TID) | SUBCUTANEOUS | Status: DC
  Administered 2018-11-13: 3 [IU] via SUBCUTANEOUS
  Administered 2018-11-13: 1 [IU] via SUBCUTANEOUS
  Administered 2018-11-14: 5 [IU] via SUBCUTANEOUS
  Administered 2018-11-14 – 2018-11-15 (×4): 2 [IU] via SUBCUTANEOUS
  Administered 2018-11-16: 3 [IU] via SUBCUTANEOUS
  Administered 2018-11-16 (×2): 5 [IU] via SUBCUTANEOUS
  Administered 2018-11-17: 2 [IU] via SUBCUTANEOUS
  Administered 2018-11-17 – 2018-11-18 (×3): 3 [IU] via SUBCUTANEOUS
  Filled 2018-11-13 (×15): qty 1

## 2018-11-13 MED ORDER — INSULIN ASPART 100 UNIT/ML ~~LOC~~ SOLN
0.0000 [IU] | Freq: Every day | SUBCUTANEOUS | Status: DC
  Administered 2018-11-14 – 2018-11-16 (×2): 3 [IU] via SUBCUTANEOUS
  Filled 2018-11-13 (×2): qty 1

## 2018-11-13 MED ORDER — HEPARIN BOLUS VIA INFUSION
800.0000 [IU] | Freq: Once | INTRAVENOUS | Status: AC
  Administered 2018-11-13: 800 [IU] via INTRAVENOUS
  Filled 2018-11-13: qty 800

## 2018-11-13 MED ORDER — INSULIN ASPART 100 UNIT/ML ~~LOC~~ SOLN
3.0000 [IU] | Freq: Three times a day (TID) | SUBCUTANEOUS | Status: DC
  Administered 2018-11-13 – 2018-11-18 (×11): 3 [IU] via SUBCUTANEOUS
  Filled 2018-11-13 (×13): qty 1

## 2018-11-13 MED ORDER — INSULIN DETEMIR 100 UNIT/ML ~~LOC~~ SOLN
14.0000 [IU] | Freq: Every day | SUBCUTANEOUS | Status: DC
  Administered 2018-11-14 – 2018-11-15 (×2): 14 [IU] via SUBCUTANEOUS
  Filled 2018-11-13 (×2): qty 0.14

## 2018-11-13 NOTE — Progress Notes (Signed)
Patient ID: Richard [name], male   DOB: Sep 26, 1956, 63 y.o.   MRN: [id number]  [employer] [facility] PROGRESS NOTE  Richard [name] OEV:[id number] DOB: 1956-10-21 DOA: 11/11/2018 PCP: [facility] [place]  HPI/Subjective: Patient complains of left foot pain.  Overnight had episodes of bradycardia and was in atrial fibrillation.  Patient with no complaints of chest pain or shortness of breath.  Mental status much improved from yesterday.  Sugars better controlled.  Objective: Vitals:   11/12/18 2223 11/13/18 0807  BP: 128/89 (!) 155/79  Pulse: 79 74  Resp: 18 18  Temp: 98.2 F (36.8 C) 97.9 F (36.6 C)  SpO2: 100% 100%    Filed Weights   11/11/18 0509  Weight: 54.9 kg    ROS: Review of Systems  Constitutional: Negative for chills and fever.  Eyes: Negative for blurred vision.  Respiratory: Negative for cough and shortness of breath.   Cardiovascular: Negative for chest pain.  Gastrointestinal: Negative for abdominal pain, constipation, diarrhea, nausea and vomiting.  Genitourinary: Negative for dysuria.  Musculoskeletal: Positive for joint pain.  Neurological: Negative for dizziness and headaches.   Exam: Physical Exam  HENT:  Nose: No mucosal edema.  Mouth/Throat: No oropharyngeal exudate or posterior oropharyngeal edema.  Eyes: Pupils are equal, round, and reactive to light. Conjunctivae, EOM and lids are normal.  Neck: No JVD present. Carotid bruit is not present. No edema present. No thyroid mass and no thyromegaly present.  Cardiovascular: S1 normal and S2 normal. An irregularly irregular rhythm present. Exam reveals no gallop.  No murmur heard. Pulses:      Dorsalis pedis pulses are 2+ on the right side and 0 on the left side.       Posterior tibial pulses are 0 on the left side.  Able to Doppler a weak pulse on the left side.  Respiratory: No respiratory distress. He has no wheezes. He has no rhonchi. He has no rales.  GI: Soft. Bowel sounds are normal.  There is no abdominal tenderness.  Musculoskeletal:     Right ankle: He exhibits no swelling.     Left ankle: He exhibits no swelling.  Lymphadenopathy:    He has no cervical adenopathy.  Neurological: He is alert. No cranial nerve deficit.  Patient with difficulty moving his left foot.  Skin: No rash noted. Nails show no clubbing.  Left foot cool to touch.  Psychiatric: He has a normal mood and affect.      Data Reviewed: Basic Metabolic Panel: Recent Labs  Lab 11/11/18 0508 11/11/18 2108 11/12/18 0301 11/13/18 0157  NA 129* 134* 136 134*  K 4.3 3.3* 3.4* 3.5  CL 94* 105 102 104  CO2 26 26 26 26   GLUCOSE 755* 154* 170* 196*  BUN 11 8 8 16   CREATININE 1.04 0.71 0.70 0.78  CALCIUM 9.5 8.4* 8.5* 8.0*  MG  --  1.9  --   --    Liver Function Tests: Recent Labs  Lab 11/11/18 0508  AST 17  ALT 13  ALKPHOS 31*  BILITOT 0.7  PROT 7.4  ALBUMIN 3.8   Recent Labs  Lab 11/11/18 0508  LIPASE 28   CBC: Recent Labs  Lab 11/11/18 0508 11/12/18 0301 11/13/18 0157  WBC 7.1 10.7* 7.6  NEUTROABS 3.3  --   --   HGB 12.4* 11.4* 10.3*  HCT 37.9* 35.3* 31.2*  MCV 90.5 89.8 88.6  PLT 358 310 285   Cardiac Enzymes: Recent Labs  Lab 11/11/18 0508 11/11/18 2108 11/12/18 0301  11/12/18 0955  TROPONINI <0.03 0.03* <0.03 <0.03   BNP (last 3 results) Recent Labs    01/09/18 0404  BNP 53.0     CBG: Recent Labs  Lab 11/12/18 1548 11/12/18 1747 11/12/18 2106 11/13/18 0808 11/13/18 1200  GLUCAP 252* 297* 193* 208* 213*    Recent Results (from the past 240 hour(s))  MRSA PCR Screening     Status: None   Collection Time: 11/11/18  9:09 AM  Result Value Ref Range Status   MRSA by PCR NEGATIVE NEGATIVE Final    Comment:        The GeneXpert MRSA Assay (FDA approved for NASAL specimens only), is one component of a comprehensive MRSA colonization surveillance program. It is not intended to diagnose MRSA infection nor to guide or monitor treatment for MRSA  infections. Performed at [facility], [address] [place], [place] [address]   Culture, blood (Routine X 2) w Reflex to ID Panel     Status: None (Preliminary result)   Collection Time: 11/11/18 12:38 PM  Result Value Ref Range Status   Specimen Description BLOOD BLOOD RIGHT HAND  Final   Special Requests   Final    BOTTLES DRAWN AEROBIC AND ANAEROBIC Blood Culture adequate volume   Culture   Final    NO GROWTH 2 DAYS Performed at [facility], [address] [place], [place] [address]    Report Status PENDING  Incomplete  Culture, blood (Routine X 2) w Reflex to ID Panel     Status: None (Preliminary result)   Collection Time: 11/11/18 12:47 PM  Result Value Ref Range Status   Specimen Description BLOOD BLOOD RIGHT HAND  Final   Special Requests   Final    BOTTLES DRAWN AEROBIC AND ANAEROBIC Blood Culture results may not be optimal due to an excessive volume of blood received in culture bottles   Culture   Final    NO GROWTH 2 DAYS Performed at [facility], [address], [place], [place] [address]    Report Status PENDING  Incomplete     Studies: No results found.  Scheduled Meds: . aspirin  325 mg Oral Daily  . atorvastatin  40 mg Oral QPM  . feeding supplement (ENSURE ENLIVE)  237 mL Oral BID BM  . insulin aspart  0-5 Units Subcutaneous QHS  . insulin aspart  0-9 Units Subcutaneous TID WC  . insulin aspart  3 Units Subcutaneous TID WC  . [START ON 11/14/2018] insulin detemir  14 Units Subcutaneous Daily  . meloxicam  15 mg Oral Daily  . multivitamin with minerals  1 tablet Oral Daily   Continuous Infusions: . heparin 1,100 Units/hr (11/13/18 0500)    Assessment/Plan:  1. Left foot pain and coolness with difficulty palpating pulses.  Patient on heparin drip.  Appreciate vascular surgery consultation. 2. Bradycardia and EKG changes.  Now off dopamine drip.  Off metoprolol.  Doing better with heart rate.  No complaints of chest  pain or shortness of breath. 3. Atrial fibrillation.  Eliquis on hold and patient on heparin drip. 4. Diabetes with hyperosmolar coma.  This has improved.  Mental status improved.  Patient on detemir insulin and aspart insulin.  Hemoglobin A1c added on. 5. Acute metabolic encephalopathy this has resolved 6. Hyponatremia.  Has improved with IV fluids. 7. Lactic acidosis likely with dehydration and hyperosmolar coma.  Procalcitonin negative. 8. History of CAD.  Metoprolol on hold.  Patient on aspirin and statin. 9. History of  pancreatic cancer status post Whipple procedure.  Code Status:     Code Status Orders  (From admission, onward)         Start     Ordered   11/11/18 0758  Do not attempt resuscitation (DNR)  Continuous    Question Answer Comment  In the event of cardiac or respiratory ARREST Do not call a "code blue"   In the event of cardiac or respiratory ARREST Do not perform Intubation, CPR, defibrillation or ACLS   In the event of cardiac or respiratory ARREST Use medication by any route, position, wound care, and other measures to relive pain and suffering. May use oxygen, suction and manual treatment of airway obstruction as needed for comfort.   Comments nurse may pronounce      11/11/18 0758        Code Status History    Date Active Date Inactive Code Status Order ID Comments User Context   09/20/2018 1635 09/24/2018 1854 Full Code [id number]  Fritzi Mandes, MD ED   08/03/2018 1020 08/04/2018 1813 Full Code [id number]  Gorden Harms, MD ED   01/09/2018 1025 01/11/2018 1932 Full Code [id number]  Harrie Foreman, MD Inpatient   09/05/2017 1559 09/11/2017 1928 Full Code [id number]  Fritzi Mandes, MD Inpatient   08/24/2017 0238 08/29/2017 2131 Full Code [id number]  Lance Coon, MD Inpatient   08/10/2015 0822 08/11/2015 1947 Full Code [id number]  Harrie Foreman, MD Inpatient     Family Communication: As per critical care team Disposition Plan: To be  determined  Consultants:  Critical care specialist  Cardiology  Vascular surgery  Time spent: 28 minutes.  Case discussed with vascular surgery  Daneil Beem [employer]

## 2018-11-13 NOTE — Progress Notes (Signed)
SUBJECTIVE: Patient is feeling much better denies any shortness of breath but has some leg pain   Vitals:   11/12/18 1600 11/12/18 1812 11/12/18 2223 11/13/18 0807  BP: (!) 124/54 109/70 128/89 (!) 155/79  Pulse: (!) 122 75 79 74  Resp: 19 18 18 18   Temp:  98 F (36.7 C) 98.2 F (36.8 C) 97.9 F (36.6 C)  TempSrc:  Oral Oral Oral  SpO2: 100% 100% 100% 100%  Weight:      Height:        Intake/Output Summary (Last 24 hours) at 11/13/2018 0931 Last data filed at 11/13/2018 0900 Gross per 24 hour  Intake 480 ml  Output 300 ml  Net 180 ml    LABS: Basic Metabolic Panel: Recent Labs    11/11/18 2108 11/12/18 0301 11/13/18 0157  NA 134* 136 134*  K 3.3* 3.4* 3.5  CL 105 102 104  CO2 26 26 26   GLUCOSE 154* 170* 196*  BUN 8 8 16   CREATININE 0.71 0.70 0.78  CALCIUM 8.4* 8.5* 8.0*  MG 1.9  --   --    Liver Function Tests: Recent Labs    11/11/18 0508  AST 17  ALT 13  ALKPHOS 31*  BILITOT 0.7  PROT 7.4  ALBUMIN 3.8   Recent Labs    11/11/18 0508  LIPASE 28   CBC: Recent Labs    11/11/18 0508 11/12/18 0301 11/13/18 0157  WBC 7.1 10.7* 7.6  NEUTROABS 3.3  --   --   HGB 12.4* 11.4* 10.3*  HCT 37.9* 35.3* 31.2*  MCV 90.5 89.8 88.6  PLT 358 310 285   Cardiac Enzymes: Recent Labs    11/11/18 2108 11/12/18 0301 11/12/18 0955  TROPONINI 0.03* <0.03 <0.03   BNP: Invalid input(s): POCBNP D-Dimer: No results for input(s): DDIMER in the last 72 hours. Hemoglobin A1C: Recent Labs    11/11/18 1248  HGBA1C 14.8*   Fasting Lipid Panel: No results for input(s): CHOL, HDL, LDLCALC, TRIG, CHOLHDL, LDLDIRECT in the last 72 hours. Thyroid Function Tests: No results for input(s): TSH, T4TOTAL, T3FREE, THYROIDAB in the last 72 hours.  Invalid input(s): FREET3 Anemia Panel: No results for input(s): VITAMINB12, FOLATE, FERRITIN, TIBC, IRON, RETICCTPCT in the last 72 hours.   PHYSICAL EXAM General: Well developed, well nourished, in no acute  distress HEENT:  Normocephalic and atramatic Neck:  No JVD.  Lungs: Clear bilaterally to auscultation and percussion. Heart: HRRR . Normal S1 and S2 without gallops or murmurs.  Abdomen: Bowel sounds are positive, abdomen soft and non-tender  Msk:  Back normal, normal gait. Normal strength and tone for age. Extremities: No clubbing, cyanosis or edema.   Neuro: Alert and oriented X 3. Psych:  Good affect, responds appropriately  TELEMETRY: Sinus rhythm  ASSESSMENT AND PLAN: No chest pain and normal left ventricular systolic function and normal wall motion on echocardiogram.  May go home with follow-up in the office next Tuesday at 10:00. Active Problems:   Diabetes with hyperosmolar coma ([place])    Dionisio David, MD, [facility] 11/13/2018 9:31 AM

## 2018-11-13 NOTE — Progress Notes (Addendum)
Inpatient Diabetes Program Recommendations  AACE/ADA: New Consensus Statement on Inpatient Glycemic Control (2015)  Target Ranges:  Prepandial:   less than 140 mg/dL      Peak postprandial:   less than 180 mg/dL (1-2 hours)      Critically ill patients:  140 - 180 mg/dL   Lab Results  Component Value Date   GLUCAP 208 (H) 11/13/2018   HGBA1C 14.8 (H) 11/11/2018   Results for Richard [name], Richard [name] (MRN [id number]) as of 11/13/2018 08:50  Ref. Range 01/09/2018 08:28 08/03/2018 11:19 11/11/2018 12:48  Hemoglobin A1C Latest Ref Range: 4.8 - 5.6 % 17.0 (H) 13.6 (H) 14.8 (H)   Review of Glycemic Control Results for [name], [name] (MRN [id number]) as of 11/13/2018 08:50  Ref. Range 11/12/2018 11:50 11/12/2018 15:48 11/12/2018 17:47 11/12/2018 21:06 11/13/2018 08:08  Glucose-Capillary Latest Ref Range: 70 - 99 mg/dL 133 (H) 252 (H) 297 (H) 193 (H) 208 (H)  Home DM Meds:Levemir 17 units Daily Novolog 5 units TID with meals Current orders for Inpatient glycemic control:  Novolog moderate tid with meals and HS, Levemir 10 units daily Inpatient Diabetes Program Recommendations:    A1C indicates that average blood sugars approximately 378 mg/dL.  ? Is patient actually taking insulin as reported.  Will see him again today to reinforce need for insulin and glycemic control.  It will be difficult for him to gain weight with blood sugars this high.    -Please consider: -Increase of  Levemir to 14 units daily.   -Reduce Novolog correction to sensitive  -Add Novolog meal coverage 3 units tid with meals (hold if patient eats less than 50%).   Thanks  Adah Perl, RN, BC-ADM Inpatient Diabetes Coordinator Pager [phone] spoke with patient regarding elevated A1C.  Told him that this indicates very poor control of DM.  Asked patient again if he is taking insulin consistently, and he admits "No".  He states that he rarely has an appetite since surgery.  Explained insulin's role  of carrying glucose from the blood stream to the cell and that without insulin, his body is starving.  Reiterated need for basal insulin EVERY day and Novolog with meals.  Also encouraged patient to check blood sugars 3 times a day.  Based on A1C, and patient's report of blood sugars, I doubt that he was checking at home.  He endorses symptoms of fatigue, dry mouth, blurred vision, etc at home which likely could be due to high blood sugars.  Plans to follow-up with surgeon on 11/27/18 regarding pancreatic surgery as well.

## 2018-11-13 NOTE — Progress Notes (Signed)
Patient ID: Richard [name], male   DOB: May 23, 1956, 63 y.o.   MRN: [id number]  Spoke with [name].  She is concerned that the patient will not take his medications at home.  I will have to speak to him more about this tomorrow.  Dr. Loletha Grayer

## 2018-11-13 NOTE — Progress Notes (Signed)
ANTICOAGULATION CONSULT NOTE   Pharmacy Consult for Heparin  Indication: chest pain/ACS  No Known Allergies  Patient Measurements: Height: _0  (177.8 cm) Weight: 121 lb 0.5 oz (54.9 kg) IBW/kg (Calculated) : 73 Heparin Dosing Weight: 54.9 kg   Vital Signs: Temp: 97.9 F (36.6 C) (01/23 0807) Temp Source: Oral (01/23 0807) BP: 155/79 (01/23 0807) Pulse Rate: 74 (01/23 0807)  Labs: Recent Labs    11/11/18 0508 11/11/18 2108  11/12/18 0301 11/12/18 0955 11/12/18 1856 11/13/18 0157 11/13/18 1050  HGB 12.4*  --   --  11.4*  --   --  10.3*  --   HCT 37.9*  --   --  35.3*  --   --  31.2*  --   PLT 358  --   --  310  --   --  285  --   APTT 31  --   --   --   --   --   --   --   LABPROT 14.4  --   --   --   --   --   --   --   INR 1.13  --   --   --   --   --   --   --   HEPARINUNFRC  --   --    < > 0.26* 0.20* 0.28* 0.27* 0.50  CREATININE 1.04 0.71  --  0.70  --   --  0.78  --   TROPONINI <0.03 0.03*  --  <0.03 <0.03  --   --   --    < > = values in this interval not displayed.    Estimated Creatinine Clearance: 73.4 mL/min (by C-G formula based on SCr of 0.78 mg/dL).   Medical History: Past Medical History:  Diagnosis Date  . Atrial fibrillation ([place])    per patient  . Cancer ([place])   . CHF (congestive heart failure) ([place])   . Chronic back pain   . Chronic leg pain   . Coronary artery disease   . Diabetes mellitus without complication ([place])   . Hypertension   . Neuropathy     Medications:  Medications Prior to Admission  Medication Sig Dispense Refill Last Dose  . ACCU-CHEK AVIVA PLUS test strip U ONCE TO BID UTD 100 each 0   . ACCU-CHEK SOFTCLIX LANCETS lancets TEST 1-2 XD 100 each 0   . acetaminophen (TYLENOL) 325 MG tablet Take 1 tablet (325 mg total) by mouth every 6 (six) hours as needed for mild pain (or Fever >/= 101). 30 tablet 0 prn at prn  . amitriptyline (ELAVIL) 50 MG tablet Take 1 tablet (50 mg total) by mouth at bedtime. 90 tablet 0 unknown  at unknown  . apixaban (ELIQUIS) 5 MG TABS tablet Take 1 tablet (5 mg total) by mouth 2 (two) times daily. 60 tablet 0 unknown at unknown  . Blood Glucose Monitoring Suppl (ACCU-CHEK AVIVA PLUS) w/Device KIT U TO TEST ONCE TO BID 1 kit 1   . gabapentin (NEURONTIN) 100 MG capsule Take 1 capsule (100 mg total) by mouth 3 (three) times daily. 180 capsule 3 unknown at unknown  . insulin aspart (NOVOLOG) 100 UNIT/ML injection Inject 5 Units into the skin 3 (three) times daily before meals. May have insulin pens 100 mL 1 unknown at unknown  . insulin detemir (LEVEMIR) 100 UNIT/ML injection Inject 0.17 mLs (17 Units total) into the skin daily. 100 mL 1 unknown at unknown  . Multiple  Vitamin (MULTIVITAMIN WITH MINERALS) TABS tablet Take 1 tablet by mouth daily. 30 tablet 1 unknown at unknown  . Needles & Syringes MISC Use as directed 100 each 3   . traZODone (DESYREL) 50 MG tablet Take 1 tablet (50 mg total) by mouth at bedtime as needed for sleep. 90 tablet 0 unknown at unknown  . aspirin 81 MG chewable tablet Chew 1 tablet (81 mg total) by mouth daily. (Patient not taking: Reported on 11/11/2018) 30 tablet 0 Not Taking at Unknown time  . atorvastatin (LIPITOR) 40 MG tablet Take 1 tablet (40 mg total) by mouth every evening. (Patient not taking: Reported on 11/11/2018) 30 tablet 0 Not Taking at Unknown time  . insulin aspart (NOVOLOG) 100 UNIT/ML injection Inject 0-15 Units into the skin 3 (three) times daily with meals. Correction coverage: Moderate (average weight, post-op) CBG < 70: implement hypoglycemia protocol CBG 70 - 120: 0 units CBG 121 - 150: 2 units CBG 151 - 200: 3 units CBG 201 - 250: 5 units CBG 251 - 300: 8 units CBG 301 - 350: 11 units CBG 351 - 400: 15 units CBG > 400: call MD (Patient not taking: Reported on 11/11/2018) 10 mL 11 Not Taking at Unknown time  . insulin aspart (NOVOLOG) 100 UNIT/ML injection Inject 0-5 Units into the skin at bedtime. CBG < 70: implement hypoglycemia  protocol CBG 70 - 120: 0 units CBG 121 - 150: 0 units CBG 151 - 200: 0 units CBG 201 - 250: 2 units CBG 251 - 300: 3 units CBG 301 - 350: 4 units CBG 351 - 400: 5 units CBG > 400: call MD (Patient not taking: Reported on 11/11/2018) 10 mL 11 Not Taking at Unknown time  . metoprolol tartrate (LOPRESSOR) 25 MG tablet Take 1 tablet (25 mg total) by mouth 2 (two) times daily. (Patient not taking: Reported on 11/11/2018) 60 tablet 0 Not Taking at Unknown time    Assessment: Pharmacy consulted to dose heparin in this 63 year old male with ACS/NSTEMI. Patient started on  heparin 800 units/hr.   Goal of Therapy:  Heparin level 0.3-0.7 units/ml Monitor platelets by anticoagulation protocol: Yes   Plan:  1/23 @ 1100: HL 0.50. Level is therapeutic x 1. Will continue 1100 units/hr. Recheck confirmatory level in 6 hours.  Pharmacy will continue to monitor and adjust per consult.  CBC with AM labs per consult.   Pernell Dupre, PharmD, BCPS Clinical Pharmacist 11/13/2018,11:30 AM

## 2018-11-13 NOTE — Care Management Note (Signed)
Case Management Note  Patient Details  Name: Richard [name] MRN: [id number] Date of Birth: 1956/09/04  Subjective/Objective:                   Met with patient to discuss DC plan Patient has a glucometer at home Patient states that he is checking his blood sugars at home Patient lives with girlfriend and daughter Patient doesn't drive but has transportation Patient uses [facility] for medication, and can afford medications with no problem Patient sees PCP but can't remember her name she is at [facility] in [place] Patient states he has no needs at this time   Action/Plan: Provided patient with my contact number for any needs  Expected Discharge Date:                  Expected Discharge Plan:     In-House Referral:     Discharge planning Services     Post Acute Care Choice:    Choice offered to:     DME Arranged:    DME Agency:     HH Arranged:    [place] Agency:     Status of Service:  In process, will continue to follow  If discussed at Long Length of Stay Meetings, dates discussed:    Additional Comments:  Su Hilt, RN 11/13/2018, 10:11 AM

## 2018-11-13 NOTE — Progress Notes (Signed)
Per Dr Lucky Cowboy due to patient eating some breakfast this am, he will have an angiogram on Monday. Discontinued NPO

## 2018-11-13 NOTE — Progress Notes (Signed)
ANTICOAGULATION CONSULT NOTE   Pharmacy Consult for Heparin  Indication: chest pain/ACS  No Known Allergies  Patient Measurements: Height: 5' 10"  (177.8 cm) Weight: 121 lb 0.5 oz (54.9 kg) IBW/kg (Calculated) : 73 Heparin Dosing Weight: 54.9 kg   Vital Signs: Temp: 98.2 F (36.8 C) (01/22 2223) Temp Source: Oral (01/22 2223) BP: 128/89 (01/22 2223) Pulse Rate: 79 (01/22 2223)  Labs: Recent Labs    11/11/18 0508 11/11/18 2108  11/12/18 0301 11/12/18 0955 11/12/18 1856 11/13/18 0157  HGB 12.4*  --   --  11.4*  --   --  10.3*  HCT 37.9*  --   --  35.3*  --   --  31.2*  PLT 358  --   --  310  --   --  285  APTT 31  --   --   --   --   --   --   LABPROT 14.4  --   --   --   --   --   --   INR 1.13  --   --   --   --   --   --   HEPARINUNFRC  --   --    < > 0.26* 0.20* 0.28* 0.27*  CREATININE 1.04 0.71  --  0.70  --   --  0.78  TROPONINI <0.03 0.03*  --  <0.03 <0.03  --   --    < > = values in this interval not displayed.    Estimated Creatinine Clearance: 73.4 mL/min (by C-G formula based on SCr of 0.78 mg/dL).   Medical History: Past Medical History:  Diagnosis Date  . Atrial fibrillation ([place])    per patient  . Cancer ([place])   . CHF (congestive heart failure) ([place])   . Chronic back pain   . Chronic leg pain   . Coronary artery disease   . Diabetes mellitus without complication ([place])   . Hypertension   . Neuropathy     Medications:  Medications Prior to Admission  Medication Sig Dispense Refill Last Dose  . ACCU-CHEK AVIVA PLUS test strip U ONCE TO BID UTD 100 each 0   . ACCU-CHEK SOFTCLIX LANCETS lancets TEST 1-2 XD 100 each 0   . acetaminophen (TYLENOL) 325 MG tablet Take 1 tablet (325 mg total) by mouth every 6 (six) hours as needed for mild pain (or Fever >/= 101). 30 tablet 0 prn at prn  . amitriptyline (ELAVIL) 50 MG tablet Take 1 tablet (50 mg total) by mouth at bedtime. 90 tablet 0 unknown at unknown  . apixaban (ELIQUIS) 5 MG TABS tablet Take 1  tablet (5 mg total) by mouth 2 (two) times daily. 60 tablet 0 unknown at unknown  . Blood Glucose Monitoring Suppl (ACCU-CHEK AVIVA PLUS) w/Device KIT U TO TEST ONCE TO BID 1 kit 1   . gabapentin (NEURONTIN) 100 MG capsule Take 1 capsule (100 mg total) by mouth 3 (three) times daily. 180 capsule 3 unknown at unknown  . insulin aspart (NOVOLOG) 100 UNIT/ML injection Inject 5 Units into the skin 3 (three) times daily before meals. May have insulin pens 100 mL 1 unknown at unknown  . insulin detemir (LEVEMIR) 100 UNIT/ML injection Inject 0.17 mLs (17 Units total) into the skin daily. 100 mL 1 unknown at unknown  . Multiple Vitamin (MULTIVITAMIN WITH MINERALS) TABS tablet Take 1 tablet by mouth daily. 30 tablet 1 unknown at unknown  . Needles & Syringes MISC Use as directed  100 each 3   . traZODone (DESYREL) 50 MG tablet Take 1 tablet (50 mg total) by mouth at bedtime as needed for sleep. 90 tablet 0 unknown at unknown  . aspirin 81 MG chewable tablet Chew 1 tablet (81 mg total) by mouth daily. (Patient not taking: Reported on 11/11/2018) 30 tablet 0 Not Taking at Unknown time  . atorvastatin (LIPITOR) 40 MG tablet Take 1 tablet (40 mg total) by mouth every evening. (Patient not taking: Reported on 11/11/2018) 30 tablet 0 Not Taking at Unknown time  . insulin aspart (NOVOLOG) 100 UNIT/ML injection Inject 0-15 Units into the skin 3 (three) times daily with meals. Correction coverage: Moderate (average weight, post-op) CBG < 70: implement hypoglycemia protocol CBG 70 - 120: 0 units CBG 121 - 150: 2 units CBG 151 - 200: 3 units CBG 201 - 250: 5 units CBG 251 - 300: 8 units CBG 301 - 350: 11 units CBG 351 - 400: 15 units CBG > 400: call MD (Patient not taking: Reported on 11/11/2018) 10 mL 11 Not Taking at Unknown time  . insulin aspart (NOVOLOG) 100 UNIT/ML injection Inject 0-5 Units into the skin at bedtime. CBG < 70: implement hypoglycemia protocol CBG 70 - 120: 0 units CBG 121 - 150: 0 units CBG 151  - 200: 0 units CBG 201 - 250: 2 units CBG 251 - 300: 3 units CBG 301 - 350: 4 units CBG 351 - 400: 5 units CBG > 400: call MD (Patient not taking: Reported on 11/11/2018) 10 mL 11 Not Taking at Unknown time  . metoprolol tartrate (LOPRESSOR) 25 MG tablet Take 1 tablet (25 mg total) by mouth 2 (two) times daily. (Patient not taking: Reported on 11/11/2018) 60 tablet 0 Not Taking at Unknown time    Assessment: Pharmacy consulted to dose heparin in this 63 year old male with ACS/NSTEMI. Patient receiving heparin 800 units/hr.   Goal of Therapy:  Heparin level 0.3-0.7 units/ml Monitor platelets by anticoagulation protocol: Yes   Plan:  11/12/2018 18:56 HL subtherapeutic. Rate was increased to 900 units/hr today at 12:53. Give 850 units IV x 1 bolus and increase rate to 1000 units/hr. Recheck HL in 6 hours.  1/23 0200 heparin level 0.27. 800 unit bolus and increase rate to 1100 units/hr. Recheck in 6 hours.  Pharmacy will continue to monitor and adjust per consult.    Antoneo Ghrist S, PharmD, BCPS Clinical Pharmacist 11/13/2018,2:57 AM

## 2018-11-13 NOTE — Progress Notes (Signed)
ANTICOAGULATION CONSULT NOTE   Pharmacy Consult for Heparin  Indication: chest pain/ACS  No Known Allergies  Patient Measurements: Height: _0  (177.8 cm) Weight: 121 lb 0.5 oz (54.9 kg) IBW/kg (Calculated) : 73 Heparin Dosing Weight: 54.9 kg   Vital Signs: Temp: 97.9 F (36.6 C) (01/23 0807) Temp Source: Oral (01/23 0807) BP: 155/79 (01/23 0807) Pulse Rate: 74 (01/23 0807)  Labs: Recent Labs    11/11/18 0508 11/11/18 2108  11/12/18 0301 11/12/18 0955 11/12/18 1856 11/13/18 0157 11/13/18 1050  HGB 12.4*  --   --  11.4*  --   --  10.3*  --   HCT 37.9*  --   --  35.3*  --   --  31.2*  --   PLT 358  --   --  310  --   --  285  --   APTT 31  --   --   --   --   --   --   --   LABPROT 14.4  --   --   --   --   --   --   --   INR 1.13  --   --   --   --   --   --   --   HEPARINUNFRC  --   --    < > 0.26* 0.20* 0.28* 0.27* 0.50  CREATININE 1.04 0.71  --  0.70  --   --  0.78  --   TROPONINI <0.03 0.03*  --  <0.03 <0.03  --   --   --    < > = values in this interval not displayed.    Estimated Creatinine Clearance: 73.4 mL/min (by C-G formula based on SCr of 0.78 mg/dL).   Medical History: Past Medical History:  Diagnosis Date  . Atrial fibrillation ([place])    per patient  . Cancer ([place])   . CHF (congestive heart failure) ([place])   . Chronic back pain   . Chronic leg pain   . Coronary artery disease   . Diabetes mellitus without complication ([place])   . Hypertension   . Neuropathy     Medications:  Medications Prior to Admission  Medication Sig Dispense Refill Last Dose  . ACCU-CHEK AVIVA PLUS test strip U ONCE TO BID UTD 100 each 0   . ACCU-CHEK SOFTCLIX LANCETS lancets TEST 1-2 XD 100 each 0   . acetaminophen (TYLENOL) 325 MG tablet Take 1 tablet (325 mg total) by mouth every 6 (six) hours as needed for mild pain (or Fever >/= 101). 30 tablet 0 prn at prn  . amitriptyline (ELAVIL) 50 MG tablet Take 1 tablet (50 mg total) by mouth at bedtime. 90 tablet 0 unknown  at unknown  . apixaban (ELIQUIS) 5 MG TABS tablet Take 1 tablet (5 mg total) by mouth 2 (two) times daily. 60 tablet 0 unknown at unknown  . Blood Glucose Monitoring Suppl (ACCU-CHEK AVIVA PLUS) w/Device KIT U TO TEST ONCE TO BID 1 kit 1   . gabapentin (NEURONTIN) 100 MG capsule Take 1 capsule (100 mg total) by mouth 3 (three) times daily. 180 capsule 3 unknown at unknown  . insulin aspart (NOVOLOG) 100 UNIT/ML injection Inject 5 Units into the skin 3 (three) times daily before meals. May have insulin pens 100 mL 1 unknown at unknown  . insulin detemir (LEVEMIR) 100 UNIT/ML injection Inject 0.17 mLs (17 Units total) into the skin daily. 100 mL 1 unknown at unknown  . Multiple  Vitamin (MULTIVITAMIN WITH MINERALS) TABS tablet Take 1 tablet by mouth daily. 30 tablet 1 unknown at unknown  . Needles & Syringes MISC Use as directed 100 each 3   . traZODone (DESYREL) 50 MG tablet Take 1 tablet (50 mg total) by mouth at bedtime as needed for sleep. 90 tablet 0 unknown at unknown  . aspirin 81 MG chewable tablet Chew 1 tablet (81 mg total) by mouth daily. (Patient not taking: Reported on 11/11/2018) 30 tablet 0 Not Taking at Unknown time  . atorvastatin (LIPITOR) 40 MG tablet Take 1 tablet (40 mg total) by mouth every evening. (Patient not taking: Reported on 11/11/2018) 30 tablet 0 Not Taking at Unknown time  . insulin aspart (NOVOLOG) 100 UNIT/ML injection Inject 0-15 Units into the skin 3 (three) times daily with meals. Correction coverage: Moderate (average weight, post-op) CBG < 70: implement hypoglycemia protocol CBG 70 - 120: 0 units CBG 121 - 150: 2 units CBG 151 - 200: 3 units CBG 201 - 250: 5 units CBG 251 - 300: 8 units CBG 301 - 350: 11 units CBG 351 - 400: 15 units CBG > 400: call MD (Patient not taking: Reported on 11/11/2018) 10 mL 11 Not Taking at Unknown time  . insulin aspart (NOVOLOG) 100 UNIT/ML injection Inject 0-5 Units into the skin at bedtime. CBG < 70: implement hypoglycemia  protocol CBG 70 - 120: 0 units CBG 121 - 150: 0 units CBG 151 - 200: 0 units CBG 201 - 250: 2 units CBG 251 - 300: 3 units CBG 301 - 350: 4 units CBG 351 - 400: 5 units CBG > 400: call MD (Patient not taking: Reported on 11/11/2018) 10 mL 11 Not Taking at Unknown time  . metoprolol tartrate (LOPRESSOR) 25 MG tablet Take 1 tablet (25 mg total) by mouth 2 (two) times daily. (Patient not taking: Reported on 11/11/2018) 60 tablet 0 Not Taking at Unknown time    Assessment: Pharmacy consulted to dose heparin in this 63 year old male with ACS/NSTEMI.   Goal of Therapy:  Heparin level 0.3-0.7 units/ml Monitor platelets by anticoagulation protocol: Yes   Heparin Course: 1/21 initiated at 700 units/hr / 3300 units bolus 1/22 0301 HL 0.26: rate increased to 800 units/hr 1/22 0955 HL 0.20: rate increased to 900 units/hr 1/22 1856 HL 0.28: rate increased to 1000 units/hr 1/23 0157 HL 0.27: rate increased to 1100 units/hr 1/23 1050 HL 0.50 1/23 1637 HL 0.58   Plan:  Level is therapeutic x 2. Will continue heparin at 1100 units/hr. Recheck level in the morning. Pharmacy will continue to monitor and adjust per consult.  CBC with AM labs per consult.   Dallie Piles, PharmD Clinical Pharmacist 11/13/2018,2:24 PM

## 2018-11-14 ENCOUNTER — Other Ambulatory Visit (INDEPENDENT_AMBULATORY_CARE_PROVIDER_SITE_OTHER): Payer: Self-pay | Admitting: Vascular Surgery

## 2018-11-14 LAB — CBC
HCT: 31.7 % — ABNORMAL LOW (ref 39.0–52.0)
Hemoglobin: 10.2 g/dL — ABNORMAL LOW (ref 13.0–17.0)
MCH: 28.9 pg (ref 26.0–34.0)
MCHC: 32.2 g/dL (ref 30.0–36.0)
MCV: 89.8 fL (ref 80.0–100.0)
Platelets: 282 10*3/uL (ref 150–400)
RBC: 3.53 MIL/uL — ABNORMAL LOW (ref 4.22–5.81)
RDW: 13.4 % (ref 11.5–15.5)
WBC: 7.9 10*3/uL (ref 4.0–10.5)
nRBC: 0 % (ref 0.0–0.2)

## 2018-11-14 LAB — GLUCOSE, CAPILLARY
GLUCOSE-CAPILLARY: 257 mg/dL — AB (ref 70–99)
Glucose-Capillary: 170 mg/dL — ABNORMAL HIGH (ref 70–99)
Glucose-Capillary: 263 mg/dL — ABNORMAL HIGH (ref 70–99)
Glucose-Capillary: 293 mg/dL — ABNORMAL HIGH (ref 70–99)

## 2018-11-14 LAB — HEPARIN LEVEL (UNFRACTIONATED): Heparin Unfractionated: 0.31 IU/mL (ref 0.30–0.70)

## 2018-11-14 MED ORDER — FLUTICASONE PROPIONATE 50 MCG/ACT NA SUSP
2.0000 | Freq: Every day | NASAL | Status: DC
  Administered 2018-11-15 – 2018-11-18 (×4): 2 via NASAL
  Filled 2018-11-14: qty 16

## 2018-11-14 MED ORDER — ALUM & MAG HYDROXIDE-SIMETH 200-200-20 MG/5ML PO SUSP: 30.0000 mL | Freq: Four times a day (QID) | ORAL | Status: DC | PRN

## 2018-11-14 MED ORDER — SODIUM CHLORIDE 0.9 % IV SOLN
INTRAVENOUS | Status: DC
  Administered 2018-11-17 (×2): via INTRAVENOUS

## 2018-11-14 MED ORDER — CEFAZOLIN SODIUM-DEXTROSE 2-4 GM/100ML-% IV SOLN
2.0000 g | INTRAVENOUS | Status: AC
  Administered 2018-11-17: 2 g via INTRAVENOUS
  Filled 2018-11-14 (×2): qty 100

## 2018-11-14 MED ORDER — FAMOTIDINE IN NACL 20-0.9 MG/50ML-% IV SOLN
20.0000 mg | Freq: Two times a day (BID) | INTRAVENOUS | Status: DC
  Administered 2018-11-14 – 2018-11-18 (×9): 20 mg via INTRAVENOUS
  Filled 2018-11-14 (×9): qty 50

## 2018-11-14 NOTE — Progress Notes (Signed)
ANTICOAGULATION CONSULT NOTE   Pharmacy Consult for Heparin  Indication: chest pain/ACS  No Known Allergies  Patient Measurements: Height: 5' 10" (177.8 cm) Weight: 121 lb 0.5 oz (54.9 kg) IBW/kg (Calculated) : 73 Heparin Dosing Weight: 54.9 kg   Vital Signs: Temp: 98.5 F (36.9 C) (01/23 2302) Temp Source: Oral (01/23 2302) BP: 165/93 (01/23 2302) Pulse Rate: 76 (01/23 2302)  Labs: Recent Labs    11/11/18 2108  11/12/18 0301 11/12/18 0955  11/13/18 0157 11/13/18 1050 11/13/18 1637 11/13/18 2329 11/14/18 0341  HGB  --    < > 11.4*  --   --  10.3*  --   --  10.3* 10.2*  HCT  --   --  35.3*  --   --  31.2*  --   --   --  31.7*  PLT  --   --  310  --   --  285  --   --   --  282  HEPARINUNFRC  --    < > 0.26* 0.20*   < > 0.27* 0.50 0.58  --  0.31  CREATININE 0.71  --  0.70  --   --  0.78  --   --   --   --   TROPONINI 0.03*  --  <0.03 <0.03  --   --   --   --   --   --    < > = values in this interval not displayed.    Estimated Creatinine Clearance: 73.4 mL/min (by C-G formula based on SCr of 0.78 mg/dL).   Medical History: Past Medical History:  Diagnosis Date  . Atrial fibrillation ([place])    per patient  . Cancer ([employer])   . CHF (congestive heart failure) ([place])   . Chronic back pain   . Chronic leg pain   . Coronary artery disease   . Diabetes mellitus without complication ([place])   . Hypertension   . Neuropathy     Medications:  Medications Prior to Admission  Medication Sig Dispense Refill Last Dose  . ACCU-CHEK AVIVA PLUS test strip U ONCE TO BID UTD 100 each 0   . ACCU-CHEK SOFTCLIX LANCETS lancets TEST 1-2 XD 100 each 0   . acetaminophen (TYLENOL) 325 MG tablet Take 1 tablet (325 mg total) by mouth every 6 (six) hours as needed for mild pain (or Fever >/= 101). 30 tablet 0 prn at prn  . amitriptyline (ELAVIL) 50 MG tablet Take 1 tablet (50 mg total) by mouth at bedtime. 90 tablet 0 unknown at unknown  . apixaban (ELIQUIS) 5 MG TABS tablet Take 1 tablet  (5 mg total) by mouth 2 (two) times daily. 60 tablet 0 unknown at unknown  . Blood Glucose Monitoring Suppl (ACCU-CHEK AVIVA PLUS) w/Device KIT U TO TEST ONCE TO BID 1 kit 1   . gabapentin (NEURONTIN) 100 MG capsule Take 1 capsule (100 mg total) by mouth 3 (three) times daily. 180 capsule 3 unknown at unknown  . insulin aspart (NOVOLOG) 100 UNIT/ML injection Inject 5 Units into the skin 3 (three) times daily before meals. May have insulin pens 100 mL 1 unknown at unknown  . insulin detemir (LEVEMIR) 100 UNIT/ML injection Inject 0.17 mLs (17 Units total) into the skin daily. 100 mL 1 unknown at unknown  . Multiple Vitamin (MULTIVITAMIN WITH MINERALS) TABS tablet Take 1 tablet by mouth daily. 30 tablet 1 unknown at unknown  . Needles & Syringes MISC Use as directed 100 each 3   .  traZODone (DESYREL) 50 MG tablet Take 1 tablet (50 mg total) by mouth at bedtime as needed for sleep. 90 tablet 0 unknown at unknown  . aspirin 81 MG chewable tablet Chew 1 tablet (81 mg total) by mouth daily. (Patient not taking: Reported on 11/11/2018) 30 tablet 0 Not Taking at Unknown time  . atorvastatin (LIPITOR) 40 MG tablet Take 1 tablet (40 mg total) by mouth every evening. (Patient not taking: Reported on 11/11/2018) 30 tablet 0 Not Taking at Unknown time  . insulin aspart (NOVOLOG) 100 UNIT/ML injection Inject 0-15 Units into the skin 3 (three) times daily with meals. Correction coverage: Moderate (average weight, post-op) CBG < 70: implement hypoglycemia protocol CBG 70 - 120: 0 units CBG 121 - 150: 2 units CBG 151 - 200: 3 units CBG 201 - 250: 5 units CBG 251 - 300: 8 units CBG 301 - 350: 11 units CBG 351 - 400: 15 units CBG > 400: call MD (Patient not taking: Reported on 11/11/2018) 10 mL 11 Not Taking at Unknown time  . insulin aspart (NOVOLOG) 100 UNIT/ML injection Inject 0-5 Units into the skin at bedtime. CBG < 70: implement hypoglycemia protocol CBG 70 - 120: 0 units CBG 121 - 150: 0 units CBG 151 - 200:  0 units CBG 201 - 250: 2 units CBG 251 - 300: 3 units CBG 301 - 350: 4 units CBG 351 - 400: 5 units CBG > 400: call MD (Patient not taking: Reported on 11/11/2018) 10 mL 11 Not Taking at Unknown time  . metoprolol tartrate (LOPRESSOR) 25 MG tablet Take 1 tablet (25 mg total) by mouth 2 (two) times daily. (Patient not taking: Reported on 11/11/2018) 60 tablet 0 Not Taking at Unknown time    Assessment: Pharmacy consulted to dose heparin in this 63 year old male with ACS/NSTEMI.   Goal of Therapy:  Heparin level 0.3-0.7 units/ml Monitor platelets by anticoagulation protocol: Yes   Heparin Course: 1/21 initiated at 700 units/hr / 3300 units bolus 1/22 0301 HL 0.26: rate increased to 800 units/hr 1/22 0955 HL 0.20: rate increased to 900 units/hr 1/22 1856 HL 0.28: rate increased to 1000 units/hr 1/23 0157 HL 0.27: rate increased to 1100 units/hr 1/23 1050 HL 0.50 1/23 1637 HL 0.58   Plan:  Level is therapeutic x 2. Will continue heparin at 1100 units/hr. Recheck level in the morning. Pharmacy will continue to monitor and adjust per consult.  CBC with AM labs per consult.   1/24 AM heparin level 0.31. Continue current regimen. Recheck heparin level and CBC with tomorrow AM labs.  Eloise Harman, PharmD Clinical Pharmacist 11/14/2018,5:52 AM

## 2018-11-14 NOTE — Progress Notes (Signed)
[facility] Daily Progress Note   Vascular Communication Note  Plan for left lower extremity angiogram with possible intervention on Monday with Dr. Lucky Cowboy Will pre-op.  Discussed with Dr. Ellis Parents Armen Waring PA-C 11/14/2018 11:43 AM

## 2018-11-14 NOTE — Progress Notes (Signed)
Patient ID: Richard [name], male   DOB: [birth year], 64 y.o.   MRN: [id number]  [facility] PROGRESS NOTE  Richard [name] [id number]:[id number] DOB: 1955-12-19 DOA: 11/11/2018 PCP: [facility] [place]  HPI/Subjective: Patient complaining of foot pain.  Patient complaining of headache in the frontal sinus area.  Patient complaining of belching and stomach discomfort.  Objective: Vitals:   11/13/18 1656 11/13/18 2302  BP: 116/77 (!) 165/93  Pulse: 73 76  Resp:  15  Temp: 98.6 F (37 C) 98.5 F (36.9 C)  SpO2: 100% 100%    Filed Weights   11/11/18 0509  Weight: 54.9 kg    ROS: Review of Systems  Constitutional: Negative for chills and fever.  Eyes: Negative for blurred vision.  Respiratory: Negative for cough and shortness of breath.   Cardiovascular: Negative for chest pain.  Gastrointestinal: Positive for abdominal pain and nausea. Negative for constipation, diarrhea and vomiting.  Genitourinary: Negative for dysuria.  Musculoskeletal: Positive for joint pain.  Neurological: Negative for dizziness and headaches.   Exam: Physical Exam  HENT:  Nose: No mucosal edema.  Mouth/Throat: No oropharyngeal exudate or posterior oropharyngeal edema.  Eyes: Pupils are equal, round, and reactive to light. Conjunctivae, EOM and lids are normal.  Neck: No JVD present. Carotid bruit is not present. No edema present. No thyroid mass and no thyromegaly present.  Cardiovascular: S1 normal and S2 normal. An irregularly irregular rhythm present. Exam reveals no gallop.  No murmur heard. Pulses:      Dorsalis pedis pulses are 2+ on the right side and 0 on the left side.       Posterior tibial pulses are 0 on the left side.  Respiratory: No respiratory distress. He has no wheezes. He has no rhonchi. He has no rales.  GI: Soft. Bowel sounds are normal. There is no abdominal tenderness.  Musculoskeletal:     Right ankle: He exhibits no swelling.     Left ankle: He exhibits no  swelling.  Lymphadenopathy:    He has no cervical adenopathy.  Neurological: He is alert. No cranial nerve deficit.  Patient seen standing on his foot this morning.  Skin: Skin is warm. No rash noted. Nails show no clubbing.  Psychiatric: He has a normal mood and affect.      Data Reviewed: Basic Metabolic Panel: Recent Labs  Lab 11/11/18 0508 11/11/18 2108 11/12/18 0301 11/13/18 0157  NA 129* 134* 136 134*  K 4.3 3.3* 3.4* 3.5  CL 94* 105 102 104  CO2 26 26 26 26   GLUCOSE 755* 154* 170* 196*  BUN 11 8 8 16   CREATININE 1.04 0.71 0.70 0.78  CALCIUM 9.5 8.4* 8.5* 8.0*  MG  --  1.9  --   --    Liver Function Tests: Recent Labs  Lab 11/11/18 0508  AST 17  ALT 13  ALKPHOS 31*  BILITOT 0.7  PROT 7.4  ALBUMIN 3.8   Recent Labs  Lab 11/11/18 0508  LIPASE 28   CBC: Recent Labs  Lab 11/11/18 0508 11/12/18 0301 11/13/18 0157 11/13/18 2329 11/14/18 0341  WBC 7.1 10.7* 7.6  --  7.9  NEUTROABS 3.3  --   --   --   --   HGB 12.4* 11.4* 10.3* 10.3* 10.2*  HCT 37.9* 35.3* 31.2*  --  31.7*  MCV 90.5 89.8 88.6  --  89.8  PLT 358 310 285  --  282   Cardiac Enzymes: Recent Labs  Lab 11/11/18 0508 11/11/18 2108 11/12/18  0301 11/12/18 0955  TROPONINI <0.03 0.03* <0.03 <0.03   BNP (last 3 results) Recent Labs    01/09/18 0404  BNP 53.0     CBG: Recent Labs  Lab 11/12/18 2106 11/13/18 0808 11/13/18 1200 11/13/18 1655 11/13/18 2119  GLUCAP 193* 208* 213* 122* 141*    Recent Results (from the past 240 hour(s))  MRSA PCR Screening     Status: None   Collection Time: 11/11/18  9:09 AM  Result Value Ref Range Status   MRSA by PCR NEGATIVE NEGATIVE Final    Comment:        The GeneXpert MRSA Assay (FDA approved for NASAL specimens only), is one component of a comprehensive MRSA colonization surveillance program. It is not intended to diagnose MRSA infection nor to guide or monitor treatment for MRSA infections. Performed at [facility],  [address] [place], [place] [address]   Culture, blood (Routine X 2) w Reflex to ID Panel     Status: None (Preliminary result)   Collection Time: 11/11/18 12:38 PM  Result Value Ref Range Status   Specimen Description BLOOD BLOOD RIGHT HAND  Final   Special Requests   Final    BOTTLES DRAWN AEROBIC AND ANAEROBIC Blood Culture adequate volume   Culture   Final    NO GROWTH 3 DAYS Performed at [facility], [address] [place], [place] [address]    Report Status PENDING  Incomplete  Culture, blood (Routine X 2) w Reflex to ID Panel     Status: None (Preliminary result)   Collection Time: 11/11/18 12:47 PM  Result Value Ref Range Status   Specimen Description BLOOD BLOOD RIGHT HAND  Final   Special Requests   Final    BOTTLES DRAWN AEROBIC AND ANAEROBIC Blood Culture results may not be optimal due to an excessive volume of blood received in culture bottles   Culture   Final    NO GROWTH 3 DAYS Performed at [facility], [address] [place], [place] [address]    Report Status PENDING  Incomplete      Scheduled Meds: . aspirin  325 mg Oral Daily  . atorvastatin  40 mg Oral QPM  . feeding supplement (ENSURE ENLIVE)  237 mL Oral BID BM  . fluticasone  2 spray Each Nare Daily  . insulin aspart  0-5 Units Subcutaneous QHS  . insulin aspart  0-9 Units Subcutaneous TID WC  . insulin aspart  3 Units Subcutaneous TID WC  . insulin detemir  14 Units Subcutaneous Daily  . meloxicam  15 mg Oral Daily  . multivitamin with minerals  1 tablet Oral Daily   Continuous Infusions: . famotidine (PEPCID) IV    . heparin 1,100 Units/hr (11/13/18 2141)    Assessment/Plan:  1. Left foot pain and coolness with difficulty palpating pulses.  Patient on heparin drip.  Angiogram to be done on Monday. 2. Bradycardia and EKG changes.  Off medications that can cause bradycardia.  Doing better with heart rate.  No complaints of chest pain or shortness of  breath. 3. Atrial fibrillation.  Eliquis on hold and patient on heparin drip. 4. Diabetes with hyperosmolar coma.  This has improved.  Mental status improved.  Patient on detemir insulin and aspart insulin.  Hemoglobin A1c 14.8 suggest noncompliance with medications.  Increase Levemir to 14 units and NovoLog 3 units prior to meals plus sliding scale.  General concerned that he was not can take his medications at home.  Patient states that he will take his medications and he has no thoughts of hurting himself or other people.. 5. Acute metabolic encephalopathy this has resolved 6. Hyponatremia.  Has improved with IV fluids. 7. Lactic acidosis likely with dehydration and hyperosmolar coma.  Procalcitonin negative. 8. History of CAD.  Metoprolol on hold.  Patient on aspirin and statin. 9. History of pancreatic cancer status post Whipple procedure. 10. Sinus headache start Flonase nasal spray. 11. Upset stomach and belching.  Start Pepcid and as needed Maalox  Code Status:     Code Status Orders  (From admission, onward)         Start     Ordered   11/11/18 0758  Do not attempt resuscitation (DNR)  Continuous    Question Answer Comment  In the event of cardiac or respiratory ARREST Do not call a "code blue"   In the event of cardiac or respiratory ARREST Do not perform Intubation, CPR, defibrillation or ACLS   In the event of cardiac or respiratory ARREST Use medication by any route, position, wound care, and other measures to relive pain and suffering. May use oxygen, suction and manual treatment of airway obstruction as needed for comfort.   Comments nurse may pronounce      11/11/18 0758        Code Status History    Date Active Date Inactive Code Status Order ID Comments User Context   09/20/2018 1635 09/24/2018 1854 Full Code [id number]  Fritzi Mandes, MD ED   08/03/2018 1020 08/04/2018 1813 Full Code [id number]  Gorden Harms, MD ED   01/09/2018 1025 01/11/2018 1932 Full Code  [id number]  Harrie Foreman, MD Inpatient   09/05/2017 1559 09/11/2017 1928 Full Code [id number]  Fritzi Mandes, MD Inpatient   08/24/2017 0238 08/29/2017 2131 Full Code [id number]  Lance Coon, MD Inpatient   08/10/2015 0822 08/11/2015 1947 Full Code [id number]  Harrie Foreman, MD Inpatient     Family Communication: Spoke with significant other yesterday afternoon Disposition Plan: To be determined  Consultants:  Critical care specialist  Cardiology  Vascular surgery  Time spent: 27 minutes.  Case discussed with nursing staff  [facility]

## 2018-11-15 ENCOUNTER — Other Ambulatory Visit: Payer: Self-pay

## 2018-11-15 LAB — GLUCOSE, CAPILLARY
Glucose-Capillary: 199 mg/dL — ABNORMAL HIGH (ref 70–99)
Glucose-Capillary: 191 mg/dL — ABNORMAL HIGH (ref 70–99)
Glucose-Capillary: 174 mg/dL — ABNORMAL HIGH (ref 70–99)
Glucose-Capillary: 156 mg/dL — ABNORMAL HIGH (ref 70–99)

## 2018-11-15 LAB — CBC
HCT: 30.6 % — ABNORMAL LOW (ref 39.0–52.0)
Hemoglobin: 9.9 g/dL — ABNORMAL LOW (ref 13.0–17.0)
MCH: 29.3 pg (ref 26.0–34.0)
MCHC: 32.4 g/dL (ref 30.0–36.0)
MCV: 90.5 fL (ref 80.0–100.0)
NRBC: 0 % (ref 0.0–0.2)
PLATELETS: 271 10*3/uL (ref 150–400)
RBC: 3.38 MIL/uL — ABNORMAL LOW (ref 4.22–5.81)
RDW: 13.2 % (ref 11.5–15.5)
WBC: 7.4 10*3/uL (ref 4.0–10.5)

## 2018-11-15 LAB — HEPARIN LEVEL (UNFRACTIONATED): Heparin Unfractionated: 0.36 IU/mL (ref 0.30–0.70)

## 2018-11-15 MED ORDER — INSULIN DETEMIR 100 UNIT/ML ~~LOC~~ SOLN
16.0000 [IU] | Freq: Every day | SUBCUTANEOUS | Status: DC
  Administered 2018-11-16 – 2018-11-18 (×3): 16 [IU] via SUBCUTANEOUS
  Filled 2018-11-15 (×4): qty 0.16

## 2018-11-15 NOTE — Progress Notes (Signed)
Patient ID: Richard [name], male   DOB: Oct 19, 1956, 63 y.o.   MRN: [id number]  [facility] PROGRESS NOTE  Richard [name] [id number]:[id number] DOB: [birth year] DOA: 11/11/2018 PCP: [facility]  HPI/Subjective: Patient still complaining of a lot of foot pain and his pain is from the knee down.  He is able to walk on it.  Objective: Vitals:   11/15/18 0733 11/15/18 1136  BP: 138/70 133/65  Pulse: 67 66  Resp: 18 18  Temp: 98 F (36.7 C) 98.4 F (36.9 C)  SpO2: 100% 100%    Filed Weights   11/11/18 0509  Weight: 54.9 kg    ROS: Review of Systems  Constitutional: Negative for chills and fever.  Eyes: Negative for blurred vision.  Respiratory: Negative for cough and shortness of breath.   Cardiovascular: Negative for chest pain.  Gastrointestinal: Negative for abdominal pain, constipation, diarrhea, nausea and vomiting.  Genitourinary: Negative for dysuria.  Musculoskeletal: Positive for joint pain.  Neurological: Negative for dizziness and headaches.   Exam: Physical Exam  HENT:  Nose: No mucosal edema.  Mouth/Throat: No oropharyngeal exudate or posterior oropharyngeal edema.  Eyes: Pupils are equal, round, and reactive to light. Conjunctivae, EOM and lids are normal.  Neck: No JVD present. Carotid bruit is not present. No edema present. No thyroid mass and no thyromegaly present.  Cardiovascular: S1 normal and S2 normal. An irregularly irregular rhythm present. Exam reveals no gallop.  No murmur heard. Pulses:      Dorsalis pedis pulses are 2+ on the right side and 0 on the left side.       Posterior tibial pulses are 0 on the left side.  Respiratory: No respiratory distress. He has no wheezes. He has no rhonchi. He has no rales.  GI: Soft. Bowel sounds are normal. There is no abdominal tenderness.  Musculoskeletal:     Right ankle: He exhibits no swelling.     Left ankle: He exhibits no swelling.  Lymphadenopathy:    He has no cervical adenopathy.   Neurological: He is alert. No cranial nerve deficit.  Skin: Skin is warm. No rash noted. Nails show no clubbing.  Psychiatric: He has a normal mood and affect.      Data Reviewed: Basic Metabolic Panel: Recent Labs  Lab 11/11/18 0508 11/11/18 2108 11/12/18 0301 11/13/18 0157  NA 129* 134* 136 134*  K 4.3 3.3* 3.4* 3.5  CL 94* 105 102 104  CO2 26 26 26 26   GLUCOSE 755* 154* 170* 196*  BUN 11 8 8 16   CREATININE 1.04 0.71 0.70 0.78  CALCIUM 9.5 8.4* 8.5* 8.0*  MG  --  1.9  --   --    Liver Function Tests: Recent Labs  Lab 11/11/18 0508  AST 17  ALT 13  ALKPHOS 31*  BILITOT 0.7  PROT 7.4  ALBUMIN 3.8   Recent Labs  Lab 11/11/18 0508  LIPASE 28   CBC: Recent Labs  Lab 11/11/18 0508 11/12/18 0301 11/13/18 0157 11/13/18 2329 11/14/18 0341 11/15/18 0505  WBC 7.1 10.7* 7.6  --  7.9 7.4  NEUTROABS 3.3  --   --   --   --   --   HGB 12.4* 11.4* 10.3* 10.3* 10.2* 9.9*  HCT 37.9* 35.3* 31.2*  --  31.7* 30.6*  MCV 90.5 89.8 88.6  --  89.8 90.5  PLT 358 310 285  --  282 271   Cardiac Enzymes: Recent Labs  Lab 11/11/18 0508 11/11/18 2108 11/12/18 0301  11/12/18 0955  TROPONINI <0.03 0.03* <0.03 <0.03   BNP (last 3 results) Recent Labs    01/09/18 0404  BNP 53.0     CBG: Recent Labs  Lab 11/14/18 1133 11/14/18 1648 11/14/18 2106 11/15/18 0735 11/15/18 1138  GLUCAP 170* 263* 293* 199* 191*    Recent Results (from the past 240 hour(s))  MRSA PCR Screening     Status: None   Collection Time: 11/11/18  9:09 AM  Result Value Ref Range Status   MRSA by PCR NEGATIVE NEGATIVE Final    Comment:        The GeneXpert MRSA Assay (FDA approved for NASAL specimens only), is one component of a comprehensive MRSA colonization surveillance program. It is not intended to diagnose MRSA infection nor to guide or monitor treatment for MRSA infections. Performed at [facility], [address] [place], [place] [address]   Culture, blood  (Routine X 2) w Reflex to ID Panel     Status: None (Preliminary result)   Collection Time: 11/11/18 12:38 PM  Result Value Ref Range Status   Specimen Description BLOOD BLOOD RIGHT HAND  Final   Special Requests   Final    BOTTLES DRAWN AEROBIC AND ANAEROBIC Blood Culture adequate volume   Culture   Final    NO GROWTH 4 DAYS Performed at [facility], [address], [place], [place] [address]    Report Status PENDING  Incomplete  Culture, blood (Routine X 2) w Reflex to ID Panel     Status: None (Preliminary result)   Collection Time: 11/11/18 12:47 PM  Result Value Ref Range Status   Specimen Description BLOOD BLOOD RIGHT HAND  Final   Special Requests   Final    BOTTLES DRAWN AEROBIC AND ANAEROBIC Blood Culture results may not be optimal due to an excessive volume of blood received in culture bottles   Culture   Final    NO GROWTH 4 DAYS Performed at [facility], [address] [place], [place] [address]    Report Status PENDING  Incomplete      Scheduled Meds: . aspirin  325 mg Oral Daily  . atorvastatin  40 mg Oral QPM  . feeding supplement (ENSURE ENLIVE)  237 mL Oral BID BM  . fluticasone  2 spray Each Nare Daily  . insulin aspart  0-5 Units Subcutaneous QHS  . insulin aspart  0-9 Units Subcutaneous TID WC  . insulin aspart  3 Units Subcutaneous TID WC  . [START ON 11/16/2018] insulin detemir  16 Units Subcutaneous Daily  . meloxicam  15 mg Oral Daily  . multivitamin with minerals  1 tablet Oral Daily   Continuous Infusions: . [START ON 11/17/2018] sodium chloride    . [START ON 11/17/2018]  ceFAZolin (ANCEF) IV    . famotidine (PEPCID) IV 20 mg (11/15/18 1016)  . heparin 1,100 Units/hr (11/15/18 0152)    Assessment/Plan:  1. Left foot pain and coolness with difficulty palpating pulses.  Patient on heparin drip.  Angiogram to be done on Monday. 2. Bradycardia and EKG changes.  Off medications that can cause bradycardia.   3. Atrial  fibrillation.  Eliquis on hold and patient on heparin drip. 4. Diabetes with hyperosmolar coma.  This has improved.  Mental status improved.  Patient on detemir insulin and aspart insulin.  Hemoglobin A1c 14.8 suggest noncompliance with medications.  Increase Levemir to 16 units and NovoLog 3 units prior to meals plus sliding scale.   5. Acute  metabolic encephalopathy this has resolved 6. Hyponatremia.  Resolved. 7. Lactic acidosis likely with dehydration and hyperosmolar coma.  Procalcitonin negative. 8. History of CAD.  Metoprolol on hold.  Patient on aspirin and statin. 9. History of pancreatic cancer status post Whipple procedure. 10. Sinus headache start Flonase nasal spray. 11. Upset stomach and belching.  Started Pepcid and as needed Maalox.  This was resolved today.  Code Status:     Code Status Orders  (From admission, onward)         Start     Ordered   11/11/18 0758  Do not attempt resuscitation (DNR)  Continuous    Question Answer Comment  In the event of cardiac or respiratory ARREST Do not call a "code blue"   In the event of cardiac or respiratory ARREST Do not perform Intubation, CPR, defibrillation or ACLS   In the event of cardiac or respiratory ARREST Use medication by any route, position, wound care, and other measures to relive pain and suffering. May use oxygen, suction and manual treatment of airway obstruction as needed for comfort.   Comments nurse may pronounce      11/11/18 0758        Code Status History    Date Active Date Inactive Code Status Order ID Comments User Context   09/20/2018 1635 09/24/2018 1854 Full Code [id number]  Fritzi Mandes, MD ED   08/03/2018 1020 08/04/2018 1813 Full Code [id number]  Gorden Harms, MD ED   01/09/2018 1025 01/11/2018 1932 Full Code [id number]  Harrie Foreman, MD Inpatient   09/05/2017 1559 09/11/2017 1928 Full Code [id number]  Fritzi Mandes, MD Inpatient   08/24/2017 0238 08/29/2017 2131 Full Code [id number]  Lance Coon, MD Inpatient   08/10/2015 0822 08/11/2015 1947 Full Code [id number]  Harrie Foreman, MD Inpatient     Disposition Plan: Potentially home Monday afternoon after angiogram versus Tuesday morning  Consultants:  Critical care specialist  Cardiology  Vascular surgery  Time spent: 25 minutes.  Case discussed with nursing staff  [facility]

## 2018-11-15 NOTE — Plan of Care (Signed)
Discussed with patient plan of care for the evening, pain management and potential procedure on Monday with some teach back displayed.

## 2018-11-15 NOTE — Plan of Care (Signed)
  Problem: Activity: Goal: Risk for activity intolerance will decrease Outcome: Completed/Met   Problem: Coping: Goal: Level of anxiety will decrease Outcome: Completed/Met   Problem: Elimination: Goal: Will not experience complications related to bowel motility Outcome: Completed/Met   Problem: Pain Managment: Goal: General experience of comfort will improve Outcome: Completed/Met

## 2018-11-15 NOTE — Progress Notes (Signed)
ANTICOAGULATION CONSULT NOTE   Pharmacy Consult for Heparin  Indication: chest pain/ACS  No Known Allergies  Patient Measurements: Height: 5' 10"  (177.8 cm) Weight: 121 lb 0.5 oz (54.9 kg) IBW/kg (Calculated) : 73 Heparin Dosing Weight: 54.9 kg   Vital Signs: Temp: 98.6 F (37 C) (01/24 2312) Temp Source: Oral (01/24 2312) BP: 149/82 (01/24 2312) Pulse Rate: 71 (01/24 2312)  Labs: Recent Labs    11/12/18 0955  11/13/18 0157  11/13/18 1637  11/14/18 0341 11/15/18 0505  HGB  --   --  10.3*  --   --    < > 10.2* 9.9*  HCT  --   --  31.2*  --   --   --  31.7* 30.6*  PLT  --   --  285  --   --   --  282 271  HEPARINUNFRC 0.20*   < > 0.27*   < > 0.58  --  0.31 0.36  CREATININE  --   --  0.78  --   --   --   --   --   TROPONINI <0.03  --   --   --   --   --   --   --    < > = values in this interval not displayed.    Estimated Creatinine Clearance: 73.4 mL/min (by C-G formula based on SCr of 0.78 mg/dL).   Medical History: Past Medical History:  Diagnosis Date  . Atrial fibrillation ([place])    per patient  . Cancer ([place])   . CHF (congestive heart failure) ([place])   . Chronic back pain   . Chronic leg pain   . Coronary artery disease   . Diabetes mellitus without complication ([place])   . Hypertension   . Neuropathy     Medications:  Medications Prior to Admission  Medication Sig Dispense Refill Last Dose  . ACCU-CHEK AVIVA PLUS test strip U ONCE TO BID UTD 100 each 0   . ACCU-CHEK SOFTCLIX LANCETS lancets TEST 1-2 XD 100 each 0   . acetaminophen (TYLENOL) 325 MG tablet Take 1 tablet (325 mg total) by mouth every 6 (six) hours as needed for mild pain (or Fever >/= 101). 30 tablet 0 prn at prn  . amitriptyline (ELAVIL) 50 MG tablet Take 1 tablet (50 mg total) by mouth at bedtime. 90 tablet 0 unknown at unknown  . apixaban (ELIQUIS) 5 MG TABS tablet Take 1 tablet (5 mg total) by mouth 2 (two) times daily. 60 tablet 0 unknown at unknown  . Blood Glucose Monitoring Suppl  (ACCU-CHEK AVIVA PLUS) w/Device KIT U TO TEST ONCE TO BID 1 kit 1   . gabapentin (NEURONTIN) 100 MG capsule Take 1 capsule (100 mg total) by mouth 3 (three) times daily. 180 capsule 3 unknown at unknown  . insulin aspart (NOVOLOG) 100 UNIT/ML injection Inject 5 Units into the skin 3 (three) times daily before meals. May have insulin pens 100 mL 1 unknown at unknown  . insulin detemir (LEVEMIR) 100 UNIT/ML injection Inject 0.17 mLs (17 Units total) into the skin daily. 100 mL 1 unknown at unknown  . Multiple Vitamin (MULTIVITAMIN WITH MINERALS) TABS tablet Take 1 tablet by mouth daily. 30 tablet 1 unknown at unknown  . Needles & Syringes MISC Use as directed 100 each 3   . traZODone (DESYREL) 50 MG tablet Take 1 tablet (50 mg total) by mouth at bedtime as needed for sleep. 90 tablet 0 unknown at unknown  .  aspirin 81 MG chewable tablet Chew 1 tablet (81 mg total) by mouth daily. (Patient not taking: Reported on 11/11/2018) 30 tablet 0 Not Taking at Unknown time  . atorvastatin (LIPITOR) 40 MG tablet Take 1 tablet (40 mg total) by mouth every evening. (Patient not taking: Reported on 11/11/2018) 30 tablet 0 Not Taking at Unknown time  . insulin aspart (NOVOLOG) 100 UNIT/ML injection Inject 0-15 Units into the skin 3 (three) times daily with meals. Correction coverage: Moderate (average weight, post-op) CBG < 70: implement hypoglycemia protocol CBG 70 - 120: 0 units CBG 121 - 150: 2 units CBG 151 - 200: 3 units CBG 201 - 250: 5 units CBG 251 - 300: 8 units CBG 301 - 350: 11 units CBG 351 - 400: 15 units CBG > 400: call MD (Patient not taking: Reported on 11/11/2018) 10 mL 11 Not Taking at Unknown time  . insulin aspart (NOVOLOG) 100 UNIT/ML injection Inject 0-5 Units into the skin at bedtime. CBG < 70: implement hypoglycemia protocol CBG 70 - 120: 0 units CBG 121 - 150: 0 units CBG 151 - 200: 0 units CBG 201 - 250: 2 units CBG 251 - 300: 3 units CBG 301 - 350: 4 units CBG 351 - 400: 5 units CBG  > 400: call MD (Patient not taking: Reported on 11/11/2018) 10 mL 11 Not Taking at Unknown time  . metoprolol tartrate (LOPRESSOR) 25 MG tablet Take 1 tablet (25 mg total) by mouth 2 (two) times daily. (Patient not taking: Reported on 11/11/2018) 60 tablet 0 Not Taking at Unknown time    Assessment: Pharmacy consulted to dose heparin in this 63 year old male with ACS/NSTEMI.   Goal of Therapy:  Heparin level 0.3-0.7 units/ml Monitor platelets by anticoagulation protocol: Yes   Heparin Course: 1/21 initiated at 700 units/hr / 3300 units bolus 1/22 0301 HL 0.26: rate increased to 800 units/hr 1/22 0955 HL 0.20: rate increased to 900 units/hr 1/22 1856 HL 0.28: rate increased to 1000 units/hr 1/23 0157 HL 0.27: rate increased to 1100 units/hr 1/23 1050 HL 0.50 1/23 1637 HL 0.58   Plan:  Level is therapeutic x 2. Will continue heparin at 1100 units/hr. Recheck level in the morning. Pharmacy will continue to monitor and adjust per consult.  CBC with AM labs per consult.   1/24 AM heparin level 0.31. Continue current regimen. Recheck heparin level and CBC with tomorrow AM labs.  1/25 AM heparin level 0.36. Continue current regimen. Recheck heparin level and CBC with tomorrow AM labs.  Eloise Harman, PharmD Clinical Pharmacist 11/15/2018,6:06 AM

## 2018-11-16 LAB — GLUCOSE, CAPILLARY
Glucose-Capillary: 250 mg/dL — ABNORMAL HIGH (ref 70–99)
Glucose-Capillary: 262 mg/dL — ABNORMAL HIGH (ref 70–99)
Glucose-Capillary: 263 mg/dL — ABNORMAL HIGH (ref 70–99)
Glucose-Capillary: 285 mg/dL — ABNORMAL HIGH (ref 70–99)

## 2018-11-16 LAB — CBC
HCT: 32.7 % — ABNORMAL LOW (ref 39.0–52.0)
Hemoglobin: 10.5 g/dL — ABNORMAL LOW (ref 13.0–17.0)
MCH: 29.3 pg (ref 26.0–34.0)
MCHC: 32.1 g/dL (ref 30.0–36.0)
MCV: 91.3 fL (ref 80.0–100.0)
PLATELETS: 279 10*3/uL (ref 150–400)
RBC: 3.58 MIL/uL — ABNORMAL LOW (ref 4.22–5.81)
RDW: 13.3 % (ref 11.5–15.5)
WBC: 8.5 10*3/uL (ref 4.0–10.5)
nRBC: 0 % (ref 0.0–0.2)

## 2018-11-16 LAB — CULTURE, BLOOD (ROUTINE X 2)
Culture: NO GROWTH
Culture: NO GROWTH
Special Requests: ADEQUATE

## 2018-11-16 LAB — HEPARIN LEVEL (UNFRACTIONATED): HEPARIN UNFRACTIONATED: 0.42 [IU]/mL (ref 0.30–0.70)

## 2018-11-16 MED ORDER — MAGNESIUM SULFATE 2 GM/50ML IV SOLN
2.0000 g | Freq: Once | INTRAVENOUS | Status: AC
  Administered 2018-11-16: 2 g via INTRAVENOUS
  Filled 2018-11-16: qty 50

## 2018-11-16 NOTE — Progress Notes (Addendum)
Notified by tele that tele strip was sent because pt had V Tach/V Fib rhythm, (she is unsure if its accurate). MD Wieting at nurses station and notified (MD looked at strip).VSS (see flowsheet). Verbal order recieved for IV Magnesium 2 g. Order placed.

## 2018-11-16 NOTE — Plan of Care (Signed)
Discussed with patient and significant other plan of care for the evening, pain management and what time the procedure for his Angiogram is for tomorrow based on the schedule with some teach back displayed

## 2018-11-16 NOTE — Progress Notes (Signed)
Patient ID: Richard [name], male   DOB: June 01, 1956, 63 y.o.   MRN: [id number]  [facility] PROGRESS NOTE  Richard [name] DGU:[id number] DOB: [birth year] DOA: 11/11/2018 PCP: [facility] [place]  HPI/Subjective: Patient still complaining of a lot of foot pain and his pain is from the knee down.  He is able to walk on it.  Objective: Vitals:   11/15/18 1639 11/15/18 2232  BP: 134/73 135/70  Pulse: 72 72  Resp: 18 18  Temp: 98.3 F (36.8 C) 98.1 F (36.7 C)  SpO2: 100% 100%    Filed Weights   11/11/18 0509  Weight: 54.9 kg    ROS: Review of Systems  Constitutional: Negative for chills and fever.  Eyes: Negative for blurred vision.  Respiratory: Negative for cough and shortness of breath.   Cardiovascular: Negative for chest pain.  Gastrointestinal: Negative for abdominal pain, constipation, diarrhea, nausea and vomiting.  Genitourinary: Negative for dysuria.  Musculoskeletal: Positive for joint pain.  Neurological: Negative for dizziness and headaches.   Exam: Physical Exam  HENT:  Nose: No mucosal edema.  Mouth/Throat: No oropharyngeal exudate or posterior oropharyngeal edema.  Eyes: Pupils are equal, round, and reactive to light. Conjunctivae, EOM and lids are normal.  Neck: No JVD present. Carotid bruit is not present. No edema present. No thyroid mass and no thyromegaly present.  Cardiovascular: S1 normal and S2 normal. An irregularly irregular rhythm present. Exam reveals no gallop.  No murmur heard. Pulses:      Dorsalis pedis pulses are 2+ on the right side and 0 on the left side.       Posterior tibial pulses are 0 on the left side.  Respiratory: No respiratory distress. He has no wheezes. He has no rhonchi. He has no rales.  GI: Soft. Bowel sounds are normal. There is no abdominal tenderness.  Musculoskeletal:     Right ankle: He exhibits no swelling.     Left ankle: He exhibits no swelling.  Lymphadenopathy:    He has no cervical  adenopathy.  Neurological: He is alert. No cranial nerve deficit.  Skin: Skin is warm. No rash noted. Nails show no clubbing.  Psychiatric: He has a normal mood and affect.      Data Reviewed: Basic Metabolic Panel: Recent Labs  Lab 11/11/18 0508 11/11/18 2108 11/12/18 0301 11/13/18 0157  NA 129* 134* 136 134*  K 4.3 3.3* 3.4* 3.5  CL 94* 105 102 104  CO2 26 26 26 26   GLUCOSE 755* 154* 170* 196*  BUN 11 8 8 16   CREATININE 1.04 0.71 0.70 0.78  CALCIUM 9.5 8.4* 8.5* 8.0*  MG  --  1.9  --   --    Liver Function Tests: Recent Labs  Lab 11/11/18 0508  AST 17  ALT 13  ALKPHOS 31*  BILITOT 0.7  PROT 7.4  ALBUMIN 3.8   Recent Labs  Lab 11/11/18 0508  LIPASE 28   CBC: Recent Labs  Lab 11/11/18 0508 11/12/18 0301 11/13/18 0157 11/13/18 2329 11/14/18 0341 11/15/18 0505 11/16/18 0513  WBC 7.1 10.7* 7.6  --  7.9 7.4 8.5  NEUTROABS 3.3  --   --   --   --   --   --   HGB 12.4* 11.4* 10.3* 10.3* 10.2* 9.9* 10.5*  HCT 37.9* 35.3* 31.2*  --  31.7* 30.6* 32.7*  MCV 90.5 89.8 88.6  --  89.8 90.5 91.3  PLT 358 310 285  --  282 271 279   Cardiac Enzymes:  Recent Labs  Lab 11/11/18 0508 11/11/18 2108 11/12/18 0301 11/12/18 0955  TROPONINI <0.03 0.03* <0.03 <0.03   BNP (last 3 results) Recent Labs    01/09/18 0404  BNP 53.0     CBG: Recent Labs  Lab 11/14/18 2106 11/15/18 0735 11/15/18 1138 11/15/18 1641 11/15/18 2107  GLUCAP 293* 199* 191* 174* 156*    Recent Results (from the past 240 hour(s))  MRSA PCR Screening     Status: None   Collection Time: 11/11/18  9:09 AM  Result Value Ref Range Status   MRSA by PCR NEGATIVE NEGATIVE Final    Comment:        The GeneXpert MRSA Assay (FDA approved for NASAL specimens only), is one component of a comprehensive MRSA colonization surveillance program. It is not intended to diagnose MRSA infection nor to guide or monitor treatment for MRSA infections. Performed at [facility], [address] [place], [place] [address]   Culture, blood (Routine X 2) w Reflex to ID Panel     Status: None   Collection Time: 11/11/18 12:38 PM  Result Value Ref Range Status   Specimen Description BLOOD BLOOD RIGHT HAND  Final   Special Requests   Final    BOTTLES DRAWN AEROBIC AND ANAEROBIC Blood Culture adequate volume   Culture   Final    NO GROWTH 5 DAYS Performed at [facility], [address] [place], [place] [address]    Report Status 11/16/2018 FINAL  Final  Culture, blood (Routine X 2) w Reflex to ID Panel     Status: None   Collection Time: 11/11/18 12:47 PM  Result Value Ref Range Status   Specimen Description BLOOD BLOOD RIGHT HAND  Final   Special Requests   Final    BOTTLES DRAWN AEROBIC AND ANAEROBIC Blood Culture results may not be optimal due to an excessive volume of blood received in culture bottles   Culture   Final    NO GROWTH 5 DAYS Performed at [facility], [address], [place], [place] [address]    Report Status 11/16/2018 FINAL  Final      Scheduled Meds: . aspirin  325 mg Oral Daily  . atorvastatin  40 mg Oral QPM  . feeding supplement (ENSURE ENLIVE)  237 mL Oral BID BM  . fluticasone  2 spray Each Nare Daily  . insulin aspart  0-5 Units Subcutaneous QHS  . insulin aspart  0-9 Units Subcutaneous TID WC  . insulin aspart  3 Units Subcutaneous TID WC  . insulin detemir  16 Units Subcutaneous Daily  . meloxicam  15 mg Oral Daily  . multivitamin with minerals  1 tablet Oral Daily   Continuous Infusions: . [START ON 11/17/2018] sodium chloride    . [START ON 11/17/2018]  ceFAZolin (ANCEF) IV    . famotidine (PEPCID) IV Stopped (11/15/18 2215)  . heparin 1,100 Units/hr (11/16/18 0550)    Assessment/Plan:  1. Left foot pain and coolness with difficulty palpating pulses.  Patient on heparin drip.  Angiogram to be done on Monday. Reminded patient not to eat tomorrow morning 2. Atrial fibrillation.  Eliquis on hold and patient on  heparin drip. 3. Diabetes with hyperosmolar coma.  This has improved.  Mental status improved.  Patient on detemir insulin and aspart insulin.  Hemoglobin A1c 14.8 suggest noncompliance with medications.  Continue Levemir to 16 units and NovoLog 3 units prior to meals plus sliding scale. 4. Acute metabolic encephalopathy this has resolved  5. Episodes of bradycarda while in ICU, Off meds that can cause bradycardia.  This has resolved 6. Hyponatremia.  Resolved. 7. Lactic acidosis likely with dehydration and hyperosmolar coma.  Procalcitonin negative. 8. History of CAD.  Metoprolol on hold.  Patient on aspirin and statin. 9. History of pancreatic cancer status post Whipple procedure. 10. Sinus headache start Flonase nasal spray. 11. Upset stomach and belching the other day.  Started on Pepcid and as needed Maalox.  Code Status:     Code Status Orders  (From admission, onward)         Start     Ordered   11/11/18 0758  Do not attempt resuscitation (DNR)  Continuous    Question Answer Comment  In the event of cardiac or respiratory ARREST Do not call a "code blue"   In the event of cardiac or respiratory ARREST Do not perform Intubation, CPR, defibrillation or ACLS   In the event of cardiac or respiratory ARREST Use medication by any route, position, wound care, and other measures to relive pain and suffering. May use oxygen, suction and manual treatment of airway obstruction as needed for comfort.   Comments nurse may pronounce      11/11/18 0758        Code Status History    Date Active Date Inactive Code Status Order ID Comments User Context   09/20/2018 1635 09/24/2018 1854 Full Code [id number]  Fritzi Mandes, MD ED   08/03/2018 1020 08/04/2018 1813 Full Code [id number]  Gorden Harms, MD ED   01/09/2018 1025 01/11/2018 1932 Full Code [id number]  Harrie Foreman, MD Inpatient   09/05/2017 1559 09/11/2017 1928 Full Code [id number]  Fritzi Mandes, MD Inpatient   08/24/2017 0238  08/29/2017 2131 Full Code [id number]  Lance Coon, MD Inpatient   08/10/2015 0822 08/11/2015 1947 Full Code [id number]  Harrie Foreman, MD Inpatient     Disposition Plan: Potentially home Monday afternoon after angiogram versus Tuesday morning  Consultants:  Critical care specialist  Cardiology  Vascular surgery  Time spent: 27 minutes.  Case discussed with nursing staff  [facility]

## 2018-11-16 NOTE — Progress Notes (Signed)
SUBJECTIVE: Patient is having leg pain   Vitals:   11/15/18 2232 11/16/18 0752 11/16/18 1002 11/16/18 1145  BP: 135/70 135/72 129/77 125/75  Pulse: 72 65 83 72  Resp: 18 18 18 18   Temp: 98.1 F (36.7 C) 98.2 F (36.8 C)  98.8 F (37.1 C)  TempSrc: Oral Oral  Oral  SpO2: 100% 100% 100% 99%  Weight:      Height:        Intake/Output Summary (Last 24 hours) at 11/16/2018 1447 Last data filed at 11/16/2018 1248 Gross per 24 hour  Intake 1870.23 ml  Output 1825 ml  Net 45.23 ml    LABS: Basic Metabolic Panel: No results for input(s): NA, K, CL, CO2, GLUCOSE, BUN, CREATININE, CALCIUM, MG, PHOS in the last 72 hours. Liver Function Tests: No results for input(s): AST, ALT, ALKPHOS, BILITOT, PROT, ALBUMIN in the last 72 hours. No results for input(s): LIPASE, AMYLASE in the last 72 hours. CBC: Recent Labs    11/15/18 0505 11/16/18 0513  WBC 7.4 8.5  HGB 9.9* 10.5*  HCT 30.6* 32.7*  MCV 90.5 91.3  PLT 271 279   Cardiac Enzymes: No results for input(s): CKTOTAL, CKMB, CKMBINDEX, TROPONINI in the last 72 hours. BNP: Invalid input(s): POCBNP D-Dimer: No results for input(s): DDIMER in the last 72 hours. Hemoglobin A1C: No results for input(s): HGBA1C in the last 72 hours. Fasting Lipid Panel: No results for input(s): CHOL, HDL, LDLCALC, TRIG, CHOLHDL, LDLDIRECT in the last 72 hours. Thyroid Function Tests: No results for input(s): TSH, T4TOTAL, T3FREE, THYROIDAB in the last 72 hours.  Invalid input(s): FREET3 Anemia Panel: No results for input(s): VITAMINB12, FOLATE, FERRITIN, TIBC, IRON, RETICCTPCT in the last 72 hours.   PHYSICAL EXAM General: Well developed, well nourished, in no acute distress HEENT:  Normocephalic and atramatic Neck:  No JVD.  Lungs: Clear bilaterally to auscultation and percussion. Heart: HRRR . Normal S1 and S2 without gallops or murmurs.  Abdomen: Bowel sounds are positive, abdomen soft and non-tender  Msk:  Back normal, normal gait.  Normal strength and tone for age. Extremities: No clubbing, cyanosis or edema.   Neuro: Alert and oriented X 3. Psych:  Good affect, responds appropriately  TELEMETRY: Sinus rhythm  ASSESSMENT AND PLAN: Lower extremity claudication and rest pain having angiogram tomorrow to evaluate any peripheral vascular disease.  Active Problems:   Diabetes with hyperosmolar coma ([place])    Dionisio David, MD, [facility] 11/16/2018 2:47 PM

## 2018-11-16 NOTE — Progress Notes (Deleted)
Per MD Molli Hazard, place pt on neutropenic precautions. Netropenic precautions initiated on 1/26 @ 1100 am. Order placed.

## 2018-11-16 NOTE — Progress Notes (Signed)
ANTICOAGULATION CONSULT NOTE   Pharmacy Consult for Heparin  Indication: chest pain/ACS  No Known Allergies  Patient Measurements: Height: _0  (177.8 cm) Weight: 121 lb 0.5 oz (54.9 kg) IBW/kg (Calculated) : 73 Heparin Dosing Weight: 54.9 kg   Vital Signs: Temp: 98.1 F (36.7 C) (01/25 2232) Temp Source: Oral (01/25 2232) BP: 135/70 (01/25 2232) Pulse Rate: 72 (01/25 2232)  Labs: Recent Labs    11/14/18 0341 11/15/18 0505 11/16/18 0513  HGB 10.2* 9.9* 10.5*  HCT 31.7* 30.6* 32.7*  PLT 282 271 279  HEPARINUNFRC 0.31 0.36 0.42    Estimated Creatinine Clearance: 73.4 mL/min (by C-G formula based on SCr of 0.78 mg/dL).   Medical History: Past Medical History:  Diagnosis Date  . Atrial fibrillation ([place])    per patient  . Cancer ([place])   . CHF (congestive heart failure) ([place])   . Chronic back pain   . Chronic leg pain   . Coronary artery disease   . Diabetes mellitus without complication ([place])   . Hypertension   . Neuropathy     Medications:  Medications Prior to Admission  Medication Sig Dispense Refill Last Dose  . ACCU-CHEK AVIVA PLUS test strip U ONCE TO BID UTD 100 each 0   . ACCU-CHEK SOFTCLIX LANCETS lancets TEST 1-2 XD 100 each 0   . acetaminophen (TYLENOL) 325 MG tablet Take 1 tablet (325 mg total) by mouth every 6 (six) hours as needed for mild pain (or Fever >/= 101). 30 tablet 0 prn at prn  . amitriptyline (ELAVIL) 50 MG tablet Take 1 tablet (50 mg total) by mouth at bedtime. 90 tablet 0 unknown at unknown  . apixaban (ELIQUIS) 5 MG TABS tablet Take 1 tablet (5 mg total) by mouth 2 (two) times daily. 60 tablet 0 unknown at unknown  . Blood Glucose Monitoring Suppl (ACCU-CHEK AVIVA PLUS) w/Device KIT U TO TEST ONCE TO BID 1 kit 1   . gabapentin (NEURONTIN) 100 MG capsule Take 1 capsule (100 mg total) by mouth 3 (three) times daily. 180 capsule 3 unknown at unknown  . insulin aspart (NOVOLOG) 100 UNIT/ML injection Inject 5 Units into the skin 3  (three) times daily before meals. May have insulin pens 100 mL 1 unknown at unknown  . insulin detemir (LEVEMIR) 100 UNIT/ML injection Inject 0.17 mLs (17 Units total) into the skin daily. 100 mL 1 unknown at unknown  . Multiple Vitamin (MULTIVITAMIN WITH MINERALS) TABS tablet Take 1 tablet by mouth daily. 30 tablet 1 unknown at unknown  . Needles & Syringes MISC Use as directed 100 each 3   . traZODone (DESYREL) 50 MG tablet Take 1 tablet (50 mg total) by mouth at bedtime as needed for sleep. 90 tablet 0 unknown at unknown  . aspirin 81 MG chewable tablet Chew 1 tablet (81 mg total) by mouth daily. (Patient not taking: Reported on 11/11/2018) 30 tablet 0 Not Taking at Unknown time  . atorvastatin (LIPITOR) 40 MG tablet Take 1 tablet (40 mg total) by mouth every evening. (Patient not taking: Reported on 11/11/2018) 30 tablet 0 Not Taking at Unknown time  . insulin aspart (NOVOLOG) 100 UNIT/ML injection Inject 0-15 Units into the skin 3 (three) times daily with meals. Correction coverage: Moderate (average weight, post-op) CBG < 70: implement hypoglycemia protocol CBG 70 - 120: 0 units CBG 121 - 150: 2 units CBG 151 - 200: 3 units CBG 201 - 250: 5 units CBG 251 - 300: 8 units CBG 301 - 350:  11 units CBG 351 - 400: 15 units CBG > 400: call MD (Patient not taking: Reported on 11/11/2018) 10 mL 11 Not Taking at Unknown time  . insulin aspart (NOVOLOG) 100 UNIT/ML injection Inject 0-5 Units into the skin at bedtime. CBG < 70: implement hypoglycemia protocol CBG 70 - 120: 0 units CBG 121 - 150: 0 units CBG 151 - 200: 0 units CBG 201 - 250: 2 units CBG 251 - 300: 3 units CBG 301 - 350: 4 units CBG 351 - 400: 5 units CBG > 400: call MD (Patient not taking: Reported on 11/11/2018) 10 mL 11 Not Taking at Unknown time  . metoprolol tartrate (LOPRESSOR) 25 MG tablet Take 1 tablet (25 mg total) by mouth 2 (two) times daily. (Patient not taking: Reported on 11/11/2018) 60 tablet 0 Not Taking at Unknown time     Assessment: Pharmacy consulted to dose heparin in this 63 year old male with ACS/NSTEMI.   Goal of Therapy:  Heparin level 0.3-0.7 units/ml Monitor platelets by anticoagulation protocol: Yes   Heparin Course: 1/21 initiated at 700 units/hr / 3300 units bolus 1/22 0301 HL 0.26: rate increased to 800 units/hr 1/22 0955 HL 0.20: rate increased to 900 units/hr 1/22 1856 HL 0.28: rate increased to 1000 units/hr 1/23 0157 HL 0.27: rate increased to 1100 units/hr 1/23 1050 HL 0.50 1/23 1637 HL 0.58   Plan:  Level is therapeutic x 2. Will continue heparin at 1100 units/hr. Recheck level in the morning. Pharmacy will continue to monitor and adjust per consult.  CBC with AM labs per consult.   1/24 AM heparin level 0.31. Continue current regimen. Recheck heparin level and CBC with tomorrow AM labs.  1/25 AM heparin level 0.36. Continue current regimen. Recheck heparin level and CBC with tomorrow AM labs.  1/26 AM heparin level 0.42. Continue current regimen. Recheck heparin level and CBC with tomorrow AM labs.  Eloise Harman, PharmD Clinical Pharmacist 11/16/2018,5:53 AM

## 2018-11-17 ENCOUNTER — Encounter
Admission: EM | Disposition: A | Discharge status: Home / Self Care | Payer: Self-pay | Source: Home / Self Care | Attending: Internal Medicine

## 2018-11-17 DIAGNOSIS — I70222 Atherosclerosis of native arteries of extremities with rest pain, left leg: Secondary | ICD-10-CM

## 2018-11-17 HISTORY — PX: LOWER EXTREMITY ANGIOGRAPHY: CATH118251

## 2018-11-17 LAB — GLUCOSE, CAPILLARY
Glucose-Capillary: 214 mg/dL — ABNORMAL HIGH (ref 70–99)
Glucose-Capillary: 186 mg/dL — ABNORMAL HIGH (ref 70–99)
Glucose-Capillary: 111 mg/dL — ABNORMAL HIGH (ref 70–99)
Glucose-Capillary: 52 mg/dL — ABNORMAL LOW (ref 70–99)
Glucose-Capillary: 93 mg/dL (ref 70–99)
Glucose-Capillary: 99 mg/dL (ref 70–99)

## 2018-11-17 LAB — MAGNESIUM: Magnesium: 2.1 mg/dL (ref 1.7–2.4)

## 2018-11-17 LAB — HEPARIN LEVEL (UNFRACTIONATED)
Heparin Unfractionated: 0.28 IU/mL — ABNORMAL LOW (ref 0.30–0.70)
Heparin Unfractionated: 0.31 IU/mL (ref 0.30–0.70)

## 2018-11-17 LAB — CBC
HCT: 30 % — ABNORMAL LOW (ref 39.0–52.0)
Hemoglobin: 9.6 g/dL — ABNORMAL LOW (ref 13.0–17.0)
MCH: 29.1 pg (ref 26.0–34.0)
MCHC: 32 g/dL (ref 30.0–36.0)
MCV: 90.9 fL (ref 80.0–100.0)
PLATELETS: 258 10*3/uL (ref 150–400)
RBC: 3.3 MIL/uL — ABNORMAL LOW (ref 4.22–5.81)
RDW: 13.4 % (ref 11.5–15.5)
WBC: 9 10*3/uL (ref 4.0–10.5)
nRBC: 0 % (ref 0.0–0.2)

## 2018-11-17 LAB — SURGICAL PCR SCREEN
MRSA, PCR: NEGATIVE
STAPHYLOCOCCUS AUREUS: POSITIVE — AB

## 2018-11-17 LAB — BASIC METABOLIC PANEL
Anion gap: 4 — ABNORMAL LOW (ref 5–15)
BUN: 15 mg/dL (ref 8–23)
CO2: 29 mmol/L (ref 22–32)
Calcium: 8.5 mg/dL — ABNORMAL LOW (ref 8.9–10.3)
Chloride: 102 mmol/L (ref 98–111)
Creatinine, Ser: 0.77 mg/dL (ref 0.61–1.24)
GFR calc Af Amer: >60 mL/min (ref >60)
Glucose, Bld: 232 mg/dL — ABNORMAL HIGH (ref 70–99)
POTASSIUM: 4.5 mmol/L (ref 3.5–5.1)
Sodium: 135 mmol/L (ref 135–145)

## 2018-11-17 SURGERY — LOWER EXTREMITY ANGIOGRAPHY
Anesthesia: Moderate Sedation | Laterality: Left

## 2018-11-17 MED ORDER — FENTANYL CITRATE (PF) 100 MCG/2ML IJ SOLN
INTRAMUSCULAR | Status: DC | PRN
  Administered 2018-11-17: 25 ug via INTRAVENOUS
  Administered 2018-11-17: 50 ug via INTRAVENOUS
  Administered 2018-11-17: 25 ug via INTRAVENOUS
  Administered 2018-11-17 (×2): 50 ug via INTRAVENOUS

## 2018-11-17 MED ORDER — MUPIROCIN 2 % EX OINT
1.0000 "application " | TOPICAL_OINTMENT | Freq: Two times a day (BID) | CUTANEOUS | Status: DC
  Administered 2018-11-17 (×2): 1 via NASAL
  Filled 2018-11-17: qty 22

## 2018-11-17 MED ORDER — MIDAZOLAM HCL 2 MG/2ML IJ SOLN
INTRAMUSCULAR | Status: DC | PRN
  Administered 2018-11-17 (×2): 2 mg via INTRAVENOUS
  Administered 2018-11-17 (×2): 1 mg via INTRAVENOUS

## 2018-11-17 MED ORDER — CHLORHEXIDINE GLUCONATE CLOTH 2 % EX PADS
6.0000 | MEDICATED_PAD | Freq: Every day | CUTANEOUS | Status: DC
  Administered 2018-11-17: 6 via TOPICAL

## 2018-11-17 MED ORDER — FENTANYL CITRATE (PF) 100 MCG/2ML IJ SOLN
INTRAMUSCULAR | Status: AC
  Filled 2018-11-17: qty 4

## 2018-11-17 MED ORDER — DEXTROSE 50 % IV SOLN: 12.5000 g | INTRAVENOUS | Status: DC

## 2018-11-17 MED ORDER — IOPAMIDOL (ISOVUE-300) INJECTION 61%
INTRAVENOUS | Status: DC | PRN
  Administered 2018-11-17: 80 mL via INTRAVENOUS

## 2018-11-17 MED ORDER — DEXTROSE 50 % IV SOLN
INTRAVENOUS | Status: AC
  Filled 2018-11-17: qty 50

## 2018-11-17 MED ORDER — LIDOCAINE-EPINEPHRINE (PF) 1 %-1:200000 IJ SOLN
INTRAMUSCULAR | Status: AC
  Filled 2018-11-17: qty 30

## 2018-11-17 MED ORDER — HEPARIN (PORCINE) IN NACL 1000-0.9 UT/500ML-% IV SOLN
INTRAVENOUS | Status: AC
  Filled 2018-11-17: qty 1000

## 2018-11-17 MED ORDER — HEPARIN SODIUM (PORCINE) 1000 UNIT/ML IJ SOLN
INTRAMUSCULAR | Status: AC
  Filled 2018-11-17: qty 1

## 2018-11-17 MED ORDER — HEPARIN SODIUM (PORCINE) 1000 UNIT/ML IJ SOLN
INTRAMUSCULAR | Status: DC | PRN
  Administered 2018-11-17: 5000 [IU] via INTRAVENOUS

## 2018-11-17 MED ORDER — MIDAZOLAM HCL 2 MG/2ML IJ SOLN
INTRAMUSCULAR | Status: AC
  Filled 2018-11-17: qty 8

## 2018-11-17 MED ORDER — APIXABAN 5 MG PO TABS
5.0000 mg | ORAL_TABLET | Freq: Two times a day (BID) | ORAL | Status: DC
  Administered 2018-11-17 – 2018-11-18 (×2): 5 mg via ORAL
  Filled 2018-11-17 (×2): qty 1

## 2018-11-17 MED ORDER — HEPARIN BOLUS VIA INFUSION
800.0000 [IU] | Freq: Once | INTRAVENOUS | Status: AC
  Administered 2018-11-17: 800 [IU] via INTRAVENOUS
  Filled 2018-11-17: qty 800

## 2018-11-17 SURGICAL SUPPLY — 29 items
BALLN DORADO 5X200X135 (BALLOONS) ×3
BALLN DORADO 6X200X135 (BALLOONS) ×3
BALLN LUTONIX 018 4X150X130 (BALLOONS) ×3
BALLN LUTONIX 018 5X220X130 (BALLOONS) ×6
BALLN ULTRASCORE 5X40X130 (BALLOONS) ×3
BALLN ULTRVRSE 2.5X300X150 (BALLOONS) ×6
BALLOON DORADO 5X200X135 (BALLOONS) ×1 IMPLANT
BALLOON DORADO 6X200X135 (BALLOONS) ×1 IMPLANT
BALLOON LUTONIX 018 4X150X130 (BALLOONS) ×1 IMPLANT
BALLOON LUTONIX 018 5X220X130 (BALLOONS) ×2 IMPLANT
BALLOON ULTRASCORE 5X40X130 (BALLOONS) ×1 IMPLANT
BALLOON ULTRVRSE 2.5X300X150 (BALLOONS) ×2 IMPLANT
CATH BEACON 5 .038 100 VERT TP (CATHETERS) ×3 IMPLANT
CATH CXI SUPP ANG 4FR 135 (CATHETERS) ×1 IMPLANT
CATH CXI SUPP ANG 4FR 135CM (CATHETERS) ×3
CATH PIG 70CM (CATHETERS) ×3 IMPLANT
DEVICE PRESTO INFLATION (MISCELLANEOUS) ×3 IMPLANT
DEVICE STARCLOSE SE CLOSURE (Vascular Products) ×3 IMPLANT
DEVICE TORQUE .014-.018 (MISCELLANEOUS) ×1 IMPLANT
GLIDEWIRE ADV .035X260CM (WIRE) ×3 IMPLANT
PACK ANGIOGRAPHY (CUSTOM PROCEDURE TRAY) ×3 IMPLANT
SHEATH ANL2 6FRX45 HC (SHEATH) ×3 IMPLANT
SHEATH BRITE TIP 5FRX11 (SHEATH) ×3 IMPLANT
STENT VIABAHN 6X250X120 (Permanent Stent) ×6 IMPLANT
SYR MEDRAD MARK V 150ML (SYRINGE) ×3 IMPLANT
TORQUE DEVICE .014-.018 (MISCELLANEOUS) ×3
TUBING CONTRAST HIGH PRESS 72 (TUBING) ×3 IMPLANT
WIRE G V18X300CM (WIRE) ×3 IMPLANT
WIRE J 3MM .035X145CM (WIRE) ×3 IMPLANT

## 2018-11-17 NOTE — Care Management Important Message (Signed)
Important Message  Patient Details  Name: Richard [name] MRN: [id number] Date of Birth: 10/13/1956   Medicare Important Message Given:  Yes    Richard [name] 11/17/2018, 11:30 AM

## 2018-11-17 NOTE — Progress Notes (Signed)
SUBJECTIVE: Patient still having leg pain   Vitals:   11/16/18 1145 11/16/18 1655 11/17/18 0037 11/17/18 0726  BP: 125/75 139/77 (!) 156/90 (!) 158/77  Pulse: 72 72 71 73  Resp: 18 18 17 18   Temp: 98.8 F (37.1 C) 98 F (36.7 C) 97.9 F (36.6 C) 97.9 F (36.6 C)  TempSrc: Oral  Oral Oral  SpO2: 99% 100% 100% 100%  Weight:      Height:        Intake/Output Summary (Last 24 hours) at 11/17/2018 0958 Last data filed at 11/17/2018 0855 Gross per 24 hour  Intake 2342.61 ml  Output 1150 ml  Net 1192.61 ml    LABS: Basic Metabolic Panel: Recent Labs    11/17/18 0508  NA 135  K 4.5  CL 102  CO2 29  GLUCOSE 232*  BUN 15  CREATININE 0.77  CALCIUM 8.5*  MG 2.1   Liver Function Tests: No results for input(s): AST, ALT, ALKPHOS, BILITOT, PROT, ALBUMIN in the last 72 hours. No results for input(s): LIPASE, AMYLASE in the last 72 hours. CBC: Recent Labs    11/16/18 0513 11/17/18 0508  WBC 8.5 9.0  HGB 10.5* 9.6*  HCT 32.7* 30.0*  MCV 91.3 90.9  PLT 279 258   Cardiac Enzymes: No results for input(s): CKTOTAL, CKMB, CKMBINDEX, TROPONINI in the last 72 hours. BNP: Invalid input(s): POCBNP D-Dimer: No results for input(s): DDIMER in the last 72 hours. Hemoglobin A1C: No results for input(s): HGBA1C in the last 72 hours. Fasting Lipid Panel: No results for input(s): CHOL, HDL, LDLCALC, TRIG, CHOLHDL, LDLDIRECT in the last 72 hours. Thyroid Function Tests: No results for input(s): TSH, T4TOTAL, T3FREE, THYROIDAB in the last 72 hours.  Invalid input(s): FREET3 Anemia Panel: No results for input(s): VITAMINB12, FOLATE, FERRITIN, TIBC, IRON, RETICCTPCT in the last 72 hours.   PHYSICAL EXAM General: Well developed, well nourished, in no acute distress HEENT:  Normocephalic and atramatic Neck:  No JVD.  Lungs: Clear bilaterally to auscultation and percussion. Heart: HRRR . Normal S1 and S2 without gallops or murmurs.  Abdomen: Bowel sounds are positive, abdomen  soft and non-tender  Msk:  Back normal, normal gait. Normal strength and tone for age. Extremities: No clubbing, cyanosis or edema.   Neuro: Alert and oriented X 3. Psych:  Good affect, responds appropriately  TELEMETRY: Sinus rhythm  ASSESSMENT AND PLAN: Intermittent claudication in the legs rule out peripheral vascular disease, patient is supposed to have an angiogram of the lower extremity today.  Active Problems:   Diabetes with hyperosmolar coma ([place])    Richard Laming A, MD, [facility] 11/17/2018 9:58 AM

## 2018-11-17 NOTE — Progress Notes (Signed)
ANTICOAGULATION CONSULT NOTE   Pharmacy Consult for Heparin  Indication: chest pain/ACS  No Known Allergies  Patient Measurements: Height: 5' 10"  (177.8 cm) Weight: 121 lb 0.5 oz (54.9 kg) IBW/kg (Calculated) : 73 Heparin Dosing Weight: 54.9 kg   Vital Signs: Temp: 97.9 F (36.6 C) (01/27 0037) Temp Source: Oral (01/27 0037) BP: 156/90 (01/27 0037) Pulse Rate: 71 (01/27 0037)  Labs: Recent Labs    11/15/18 0505 11/16/18 0513 11/17/18 0508  HGB 9.9* 10.5* 9.6*  HCT 30.6* 32.7* 30.0*  PLT 271 279 258  HEPARINUNFRC 0.36 0.42 0.28*  CREATININE  --   --  0.77    Estimated Creatinine Clearance: 73.4 mL/min (by C-G formula based on SCr of 0.77 mg/dL).   Medical History: Past Medical History:  Diagnosis Date  . Atrial fibrillation ([place])    per patient  . Cancer ([place])   . CHF (congestive heart failure) ([place])   . Chronic back pain   . Chronic leg pain   . Coronary artery disease   . Diabetes mellitus without complication ([place])   . Hypertension   . Neuropathy     Medications:  Medications Prior to Admission  Medication Sig Dispense Refill Last Dose  . ACCU-CHEK AVIVA PLUS test strip U ONCE TO BID UTD 100 each 0   . ACCU-CHEK SOFTCLIX LANCETS lancets TEST 1-2 XD 100 each 0   . acetaminophen (TYLENOL) 325 MG tablet Take 1 tablet (325 mg total) by mouth every 6 (six) hours as needed for mild pain (or Fever >/= 101). 30 tablet 0 prn at prn  . amitriptyline (ELAVIL) 50 MG tablet Take 1 tablet (50 mg total) by mouth at bedtime. 90 tablet 0 unknown at unknown  . apixaban (ELIQUIS) 5 MG TABS tablet Take 1 tablet (5 mg total) by mouth 2 (two) times daily. 60 tablet 0 unknown at unknown  . Blood Glucose Monitoring Suppl (ACCU-CHEK AVIVA PLUS) w/Device KIT U TO TEST ONCE TO BID 1 kit 1   . gabapentin (NEURONTIN) 100 MG capsule Take 1 capsule (100 mg total) by mouth 3 (three) times daily. 180 capsule 3 unknown at unknown  . insulin aspart (NOVOLOG) 100 UNIT/ML injection Inject 5  Units into the skin 3 (three) times daily before meals. May have insulin pens 100 mL 1 unknown at unknown  . insulin detemir (LEVEMIR) 100 UNIT/ML injection Inject 0.17 mLs (17 Units total) into the skin daily. 100 mL 1 unknown at unknown  . Multiple Vitamin (MULTIVITAMIN WITH MINERALS) TABS tablet Take 1 tablet by mouth daily. 30 tablet 1 unknown at unknown  . Needles & Syringes MISC Use as directed 100 each 3   . traZODone (DESYREL) 50 MG tablet Take 1 tablet (50 mg total) by mouth at bedtime as needed for sleep. 90 tablet 0 unknown at unknown  . aspirin 81 MG chewable tablet Chew 1 tablet (81 mg total) by mouth daily. (Patient not taking: Reported on 11/11/2018) 30 tablet 0 Not Taking at Unknown time  . atorvastatin (LIPITOR) 40 MG tablet Take 1 tablet (40 mg total) by mouth every evening. (Patient not taking: Reported on 11/11/2018) 30 tablet 0 Not Taking at Unknown time  . insulin aspart (NOVOLOG) 100 UNIT/ML injection Inject 0-15 Units into the skin 3 (three) times daily with meals. Correction coverage: Moderate (average weight, post-op) CBG < 70: implement hypoglycemia protocol CBG 70 - 120: 0 units CBG 121 - 150: 2 units CBG 151 - 200: 3 units CBG 201 - 250: 5 units CBG  251 - 300: 8 units CBG 301 - 350: 11 units CBG 351 - 400: 15 units CBG > 400: call MD (Patient not taking: Reported on 11/11/2018) 10 mL 11 Not Taking at Unknown time  . insulin aspart (NOVOLOG) 100 UNIT/ML injection Inject 0-5 Units into the skin at bedtime. CBG < 70: implement hypoglycemia protocol CBG 70 - 120: 0 units CBG 121 - 150: 0 units CBG 151 - 200: 0 units CBG 201 - 250: 2 units CBG 251 - 300: 3 units CBG 301 - 350: 4 units CBG 351 - 400: 5 units CBG > 400: call MD (Patient not taking: Reported on 11/11/2018) 10 mL 11 Not Taking at Unknown time  . metoprolol tartrate (LOPRESSOR) 25 MG tablet Take 1 tablet (25 mg total) by mouth 2 (two) times daily. (Patient not taking: Reported on 11/11/2018) 60 tablet 0 Not  Taking at Unknown time    Assessment: Pharmacy consulted to dose heparin in this 63 year old male with ACS/NSTEMI.   Goal of Therapy:  Heparin level 0.3-0.7 units/ml Monitor platelets by anticoagulation protocol: Yes   Heparin Course: 1/21 initiated at 700 units/hr / 3300 units bolus 1/22 0301 HL 0.26: rate increased to 800 units/hr 1/22 0955 HL 0.20: rate increased to 900 units/hr 1/22 1856 HL 0.28: rate increased to 1000 units/hr 1/23 0157 HL 0.27: rate increased to 1100 units/hr 1/23 1050 HL 0.50 1/23 1637 HL 0.58   Plan:  Level is therapeutic x 2. Will continue heparin at 1100 units/hr. Recheck level in the morning. Pharmacy will continue to monitor and adjust per consult.  CBC with AM labs per consult.   1/24 AM heparin level 0.31. Continue current regimen. Recheck heparin level and CBC with tomorrow AM labs.  1/25 AM heparin level 0.36. Continue current regimen. Recheck heparin level and CBC with tomorrow AM labs.  1/26 AM heparin level 0.42. Continue current regimen. Recheck heparin level and CBC with tomorrow AM labs.  1/27 AM heparin level 0.28. 800 unit bolus and increase rate to 1200 units/hr. Recheck in 6 hours.  Eloise Harman, PharmD Clinical Pharmacist 11/17/2018,5:40 AM

## 2018-11-17 NOTE — Progress Notes (Signed)
Patient ID: Richard [name], male   DOB: [birth year], 63 y.o.   MRN: [id number]  [facility] PROGRESS NOTE  Richard [name] PTW:[id number] DOB: 1955-12-28 DOA: 11/11/2018 PCP: [facility] [place]  HPI/Subjective: Patient continues to complain of pain in his foot Objective: Vitals:   11/17/18 1500 11/17/18 1505  BP:    Pulse:    Resp:    Temp:    SpO2: 100% 100%    Filed Weights   11/11/18 0509  Weight: 54.9 kg    ROS: Review of Systems  Constitutional: Negative for chills and fever.  Eyes: Negative for blurred vision.  Respiratory: Negative for cough and shortness of breath.   Cardiovascular: Negative for chest pain.  Gastrointestinal: Negative for abdominal pain, constipation, diarrhea, nausea and vomiting.  Genitourinary: Negative for dysuria.  Musculoskeletal: Positive for joint pain.  Neurological: Negative for dizziness and headaches.   Exam: Physical Exam  HENT:  Nose: No mucosal edema.  Mouth/Throat: No oropharyngeal exudate or posterior oropharyngeal edema.  Eyes: Pupils are equal, round, and reactive to light. Conjunctivae, EOM and lids are normal.  Neck: No JVD present. Carotid bruit is not present. No edema present. No thyroid mass and no thyromegaly present.  Cardiovascular: S1 normal and S2 normal. An irregularly irregular rhythm present. Exam reveals no gallop.  No murmur heard. Pulses:      Dorsalis pedis pulses are 2+ on the right side and 0 on the left side.       Posterior tibial pulses are 0 on the left side.  Respiratory: No respiratory distress. He has no wheezes. He has no rhonchi. He has no rales.  GI: Soft. Bowel sounds are normal. There is no abdominal tenderness.  Musculoskeletal:     Right ankle: He exhibits no swelling.     Left ankle: He exhibits no swelling.  Lymphadenopathy:    He has no cervical adenopathy.  Neurological: He is alert. No cranial nerve deficit.  Skin: Skin is warm. No rash noted. Nails show no clubbing.   Psychiatric: He has a normal mood and affect.      Data Reviewed: Basic Metabolic Panel: Recent Labs  Lab 11/11/18 0508 11/11/18 2108 11/12/18 0301 11/13/18 0157 11/17/18 0508  NA 129* 134* 136 134* 135  K 4.3 3.3* 3.4* 3.5 4.5  CL 94* 105 102 104 102  CO2 26 26 26 26 29   GLUCOSE 755* 154* 170* 196* 232*  BUN 11 8 8 16 15   CREATININE 1.04 0.71 0.70 0.78 0.77  CALCIUM 9.5 8.4* 8.5* 8.0* 8.5*  MG  --  1.9  --   --  2.1   Liver Function Tests: Recent Labs  Lab 11/11/18 0508  AST 17  ALT 13  ALKPHOS 31*  BILITOT 0.7  PROT 7.4  ALBUMIN 3.8   Recent Labs  Lab 11/11/18 0508  LIPASE 28   CBC: Recent Labs  Lab 11/11/18 0508  11/13/18 0157 11/13/18 2329 11/14/18 0341 11/15/18 0505 11/16/18 0513 11/17/18 0508  WBC 7.1   < > 7.6  --  7.9 7.4 8.5 9.0  NEUTROABS 3.3  --   --   --   --   --   --   --   HGB 12.4*   < > 10.3* 10.3* 10.2* 9.9* 10.5* 9.6*  HCT 37.9*   < > 31.2*  --  31.7* 30.6* 32.7* 30.0*  MCV 90.5   < > 88.6  --  89.8 90.5 91.3 90.9  PLT 358   < >  285  --  282 271 279 258   < > = values in this interval not displayed.   Cardiac Enzymes: Recent Labs  Lab 11/11/18 0508 11/11/18 2108 11/12/18 0301 11/12/18 0955  TROPONINI <0.03 0.03* <0.03 <0.03   BNP (last 3 results) Recent Labs    01/09/18 0404  BNP 53.0     CBG: Recent Labs  Lab 11/16/18 1657 11/16/18 2104 11/17/18 0729 11/17/18 1141 11/17/18 1400  GLUCAP 263* 285* 214* 186* 111*    Recent Results (from the past 240 hour(s))  MRSA PCR Screening     Status: None   Collection Time: 11/11/18  9:09 AM  Result Value Ref Range Status   MRSA by PCR NEGATIVE NEGATIVE Final    Comment:        The GeneXpert MRSA Assay (FDA approved for NASAL specimens only), is one component of a comprehensive MRSA colonization surveillance program. It is not intended to diagnose MRSA infection nor to guide or monitor treatment for MRSA infections. Performed at [facility], [address] [place], [name] [address]   Culture, blood (Routine X 2) w Reflex to ID Panel     Status: None   Collection Time: 11/11/18 12:38 PM  Result Value Ref Range Status   Specimen Description BLOOD BLOOD RIGHT HAND  Final   Special Requests   Final    BOTTLES DRAWN AEROBIC AND ANAEROBIC Blood Culture adequate volume   Culture   Final    NO GROWTH 5 DAYS Performed at [facility], [address] [place], [place] [address]    Report Status 11/16/2018 FINAL  Final  Culture, blood (Routine X 2) w Reflex to ID Panel     Status: None   Collection Time: 11/11/18 12:47 PM  Result Value Ref Range Status   Specimen Description BLOOD BLOOD RIGHT HAND  Final   Special Requests   Final    BOTTLES DRAWN AEROBIC AND ANAEROBIC Blood Culture results may not be optimal due to an excessive volume of blood received in culture bottles   Culture   Final    NO GROWTH 5 DAYS Performed at [facility], [address] [place], [place] [address]    Report Status 11/16/2018 FINAL  Final  Surgical pcr screen     Status: Abnormal   Collection Time: 11/16/18 10:41 PM  Result Value Ref Range Status   MRSA, PCR NEGATIVE NEGATIVE Final   Staphylococcus aureus POSITIVE (A) NEGATIVE Final    Comment: (NOTE) The Xpert SA Assay (FDA approved for NASAL specimens in patients 25 years of age and older), is one component of a comprehensive surveillance program. It is not intended to diagnose infection nor to guide or monitor treatment. Performed at [facility], [address], [place],  [address]       Scheduled Meds: . [MAR Hold] aspirin  325 mg Oral Daily  . [MAR Hold] atorvastatin  40 mg Oral QPM  . [MAR Hold] Chlorhexidine Gluconate Cloth  6 each Topical Daily  . [MAR Hold] feeding supplement (ENSURE ENLIVE)  237 mL Oral BID BM  . [MAR Hold] fluticasone  2 spray Each Nare Daily  . [MAR Hold] insulin aspart  0-5 Units Subcutaneous QHS  . [MAR Hold] insulin  aspart  0-9 Units Subcutaneous TID WC  . [MAR Hold] insulin aspart  3 Units Subcutaneous TID WC  . [MAR Hold] insulin detemir  16 Units Subcutaneous Daily  . [MAR Hold] meloxicam  15 mg Oral  Daily  . [MAR Hold] multivitamin with minerals  1 tablet Oral Daily  . [MAR Hold] mupirocin ointment  1 application Nasal BID   Continuous Infusions: . sodium chloride Stopped (11/17/18 0816)  . [MAR Hold]  ceFAZolin (ANCEF) IV 2 g (11/17/18 1445)  . [MAR Hold] famotidine (PEPCID) IV Stopped (11/17/18 0846)  . heparin Stopped (11/17/18 1357)    Assessment/Plan:  1. Left foot pain and coolness with difficulty palpating pulses.  Patient on heparin drip.  Angiogram pending today 2. Atrial fibrillation.  Eliquis on hold and patient on heparin drip.  Resume Eliquis post angiography 3. Diabetes with hyperosmolar coma.  This has improved.  Mental status improved.  Patient on detemir insulin and aspart insulin.  Hemoglobin A1c 14.8 suggest noncompliance with medications.  Continue Levemir to 16 units and NovoLog 3 units prior to meals plus sliding scale. 4. Acute metabolic encephalopathy this has resolved 5. Episodes of bradycarda while in ICU, Off meds that can cause bradycardia.  This has resolved 6. Hyponatremia.  Resolved. 7. Lactic acidosis likely with dehydration and hyperosmolar coma.  Procalcitonin negative. 8. History of CAD.  Metoprolol on hold.  Patient on aspirin and statin. 9. History of pancreatic cancer status post Whipple procedure. 10. Sinus headache start Flonase nasal spray.  Code Status:     Code Status Orders  (From admission, onward)         Start     Ordered   11/11/18 0758  Do not attempt resuscitation (DNR)  Continuous    Question Answer Comment  In the event of cardiac or respiratory ARREST Do not call a "code blue"   In the event of cardiac or respiratory ARREST Do not perform Intubation, CPR, defibrillation or ACLS   In the event of cardiac or respiratory ARREST Use  medication by any route, position, wound care, and other measures to relive pain and suffering. May use oxygen, suction and manual treatment of airway obstruction as needed for comfort.   Comments nurse may pronounce      11/11/18 0758        Code Status History    Date Active Date Inactive Code Status Order ID Comments User Context   09/20/2018 1635 09/24/2018 1854 Full Code [id number]  Fritzi Mandes, MD ED   08/03/2018 1020 08/04/2018 1813 Full Code [id number]  Gorden Harms, MD ED   01/09/2018 1025 01/11/2018 1932 Full Code [id number]  Harrie Foreman, MD Inpatient   09/05/2017 1559 09/11/2017 1928 Full Code [id number]  Fritzi Mandes, MD Inpatient   08/24/2017 0238 08/29/2017 2131 Full Code [id number]  Lance Coon, MD Inpatient   08/10/2015 0822 08/11/2015 1947 Full Code [id number]  Harrie Foreman, MD Inpatient     Disposition Plan: Potentially home Monday afternoon after angiogram versus Tuesday morning  Consultants:  Critical care specialist  Cardiology  Vascular surgery  Time spent: 27 minutes.  Case discussed with nursing staff  [facility]

## 2018-11-17 NOTE — Op Note (Signed)
[facility]  Percutaneous Study/Intervention Procedural Note   Date of Surgery: 11/17/2018  Surgeon(s):Dejean Tribby    Assistants:none  Pre-operative Diagnosis: PAD with rest pain left foot  Post-operative diagnosis:  Same  Procedure(s) Performed:             1.  Ultrasound guidance for vascular access right femoral artery             2.  Catheter placement into left common femoral artery from right femoral approach             3.  Aortogram and selective left lower extremity angiogram             4.  Percutaneous transluminal angioplasty of left anterior tibial artery with 2.5 mm diameter by 30 cm length angioplasty balloon as well as a 4 mm diameter Lutonix drug-coated angioplasty balloon at the origin             5.   Percutaneous transluminal angioplasty of the popliteal artery with a 4 mm diameter by 15 cm length Lutonix drug-coated angioplasty balloon and of the SFA with two 5 mm diameter by 22 cm Lutonix drug-coated length angioplasty balloons  6.  Viabahn stent placement x2 to the left SFA and popliteal arteries both being 6 mm diameter by 25 cm length stents for multiple areas of greater than 50% residual stenosis after angioplasty             7.  StarClose closure device right femoral artery  EBL: 5 cc  Contrast: 80 cc  Fluoro Time: 19 minutes  Moderate Conscious Sedation Time: approximately 70 minutes using 6 mg of Versed and 200 Mcg of Fentanyl              Indications:  Patient is a 63 y.o.male with symptoms of rest pain in the left foot.  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  He was admitted to the hospital with other issues and had not had any noninvasive studies so angiogram would be both diagnostic and potentially therapeutic. The patient is brought in for angiography for further evaluation and potential treatment.  Due to the limb threatening nature of the situation, angiogram was performed for attempted limb salvage. The  patient is aware that if the procedure fails, amputation would be expected.  The patient also understands that even with successful revascularization, amputation may still be required due to the severity of the situation.  Risks and benefits are discussed and informed consent is obtained.   Procedure:  The patient was identified and appropriate procedural time out was performed.  The patient was then placed supine on the table and prepped and draped in the usual sterile fashion. Moderate conscious sedation was administered during a face to face encounter with the patient throughout the procedure with my supervision of the RN administering medicines and monitoring the patient's vital signs, pulse oximetry, telemetry and mental status throughout from the start of the procedure until the patient was taken to the recovery room. Ultrasound was used to evaluate the right common femoral artery.  It was patent .  A digital ultrasound image was acquired.  A Seldinger needle was used to access the right common femoral artery under direct ultrasound guidance and a permanent image was performed.  A 0.035 J wire was advanced without resistance and a 5Fr sheath was placed.  Pigtail catheter was placed into the aorta and an AP aortogram was performed. This demonstrated normal renal arteries  and normal aorta and iliac segments without significant stenosis. I then crossed the aortic bifurcation and advanced to the left femoral head. Selective left lower extremity angiogram was then performed. This demonstrated normal common femoral artery and profunda femoris artery with basically a flush occlusion of the left SFA.  This reconstituted a diseased above-knee popliteal artery with disease continuing down through the popliteal artery below the knee with multiple areas of greater than 50% stenosis.  The anterior tibial artery had about a 90% stenosis at its origin and multiple areas of greater than 70% stenosis downstream.  The  posterior tibial artery had several areas of mild stenosis but nothing that appeared flow-limiting.  These were the runoff vessels distally. It was felt that it was in the patient's best interest to proceed with intervention after these images to avoid a second procedure and a larger amount of contrast and fluoroscopy based off of the findings from the initial angiogram. The patient was systemically heparinized and a 6 [place] Ansell sheath was then placed over the [employer] wire. I then used a Kumpe catheter and the advantage wire to navigate into the SFA occlusion.  This was a long calcified lesion and crossing the occlusion was quite tedious.  I was able to regain intraluminal flow in the popliteal artery and confirm intraluminal flow.  I then advanced the advantage wire and a CXI catheter down into the anterior tibial artery and cross the proximal stenosis.  Once I was in the proximal to mid anterior tibial artery and had confirmed intraluminal flow, I then exchanged for a 0.018 wire.  I then proceeded with a 2.5 mm diameter by 30 cm length angioplasty balloon and treated the anterior tibial artery from just above the ankle up to its origin.  This was inflated to 10 atm for 1 minute.  The anterior tibial artery appeared to have improved flow, but significant greater than 70% residual stenosis was seen at the origin.  I used a 4 mm diameter by 15 cm length Lutonix drug-coated angioplasty balloon to treat the below-knee popliteal artery in the proximal anterior tibial artery.  This was inflated to 6 atm for 1 minute.  I then used a pair of 5 mm diameter by 22 cm length Lutonix drug-coated angioplasty balloons to treat from the below-knee popliteal artery all the way up to the origin of the SFA.  Both inflations were 10 to 12 atm for 1 minute.  1 area in the mid SFA never broke it burst inflation and I used an ultra score balloon and inflated this to 10 atm in the mid SFA.  Completion imaging showed multiple  areas of greater than 50% residual stenosis throughout the SFA and popliteal arteries.  I used two Viabahn covered stents to treat from the below-knee popliteal artery all the way up to the proximal SFA.  These were both 6 mm in diameter by 25 cm in length.  I then postdilated these areas with a 5 mm diameter high-pressure balloons in the mid SFA and the area that had the significant constraint requiring the ultra score balloon had to use a 6 mm diameter high-pressure angioplasty balloon inflated to 22 atm.  Completion imaging following this showed no greater than 20% residual stenosis in the SFA or popliteal arteries now with two-vessel runoff distally.  There was still roughly 40 to 45% residual stenosis in the proximal anterior tibial artery, but this was a poor location for a stent and we had already used a 4  balloon in this location. I elected to terminate the procedure. The sheath was removed and StarClose closure device was deployed in the right femoral artery with excellent hemostatic result. The patient was taken to the recovery room in stable condition having tolerated the procedure well.  Findings:               Aortogram:  Normal renal arteries, aorta, and iliac arteries without significant stenosis             Left lower Extremity:  Normal common femoral artery and profunda femoris artery with basically a flush occlusion of the left SFA.  This reconstituted a diseased above-knee popliteal artery with disease continuing down through the popliteal artery below the knee with multiple areas of greater than 50% stenosis.  The anterior tibial artery had about a 90% stenosis at its origin and multiple areas of greater than 70% stenosis downstream.  The posterior tibial artery had several areas of mild stenosis but nothing that appeared flow-limiting.  These were the runoff vessels distally.   Disposition: Patient was taken to the recovery room in stable condition having tolerated the procedure  well.  Complications: None  Leotis Pain 11/17/2018 4:20 PM   This note was created with Dragon Medical transcription system. Any errors in dictation are purely unintentional.

## 2018-11-17 NOTE — Plan of Care (Signed)
Discussed with patient plan of care for the evening, pain management and discharge plans with some teach back displayed

## 2018-11-17 NOTE — H&P (Signed)
[facility] History & Physical Update  The patient was interviewed and re-examined.  The patient's previous History and Physical has been reviewed and is unchanged.  There is no change in the plan of care. We plan to proceed with the scheduled procedure.  Leotis Pain, MD  11/17/2018, 2:20 PM

## 2018-11-17 NOTE — Progress Notes (Addendum)
Inpatient Diabetes Program Recommendations  AACE/ADA: New Consensus Statement on Inpatient Glycemic Control (2015)  Target Ranges:  Prepandial:   less than 140 mg/dL      Peak postprandial:   less than 180 mg/dL (1-2 hours)      Critically ill patients:  140 - 180 mg/dL   Results for PACEN, WATFORD (MRN [id number]) as of 11/17/2018 10:01  Ref. Range 11/16/2018 07:55 11/16/2018 11:47 11/16/2018 16:57 11/16/2018 21:04  Glucose-Capillary Latest Ref Range: 70 - 99 mg/dL 250 (H)  6 units NOVOLOG +  16 units LEVEMIR at 9:30am  262 (H)  8 units NOVOLOG  263 (H)  8 units NOVOLOG  285 (H)  3 units NOVOLOG    Results for RHYLEE, NUNN (MRN [id number]) as of 11/17/2018 10:01  Ref. Range 11/17/2018 07:29  Glucose-Capillary Latest Ref Range: 70 - 99 mg/dL 214 (H)  3 units NOVOLOG     Home DM Meds: Levemir 17 units Daily                             Novolog 5 units TID with meals  Current Orders: Levemir 16 units Daily      Novolog Sensitive Correction Scale/ SSI (0-9 units) TID AC + HS      Novolog 3 units TID with meals     MD- Please consider the following in-hospital insulin adjustments:  1. Increase Levemir to 18 units Daily (CBG 214 mg/dl this AM)--If dose already given this AM, please place order to give an extra 2 units Levemir this AM  2. Increase Novolog Meal Coverage to: Novolog 5 units TID with meals (Home Dose)  (Please add the following Hold Parameters: Hold if pt eats <50% of meal, Hold if pt NPO)      --Will follow patient during hospitalization--  Wyn Quaker RN, MSN, CDE Diabetes Coordinator Inpatient Glycemic Control Team Team Pager: [phone] (8a-5p)

## 2018-11-17 NOTE — Progress Notes (Signed)
ANTICOAGULATION CONSULT NOTE   Pharmacy Consult for Heparin  Indication: chest pain/ACS  No Known Allergies  Patient Measurements: Height: 5\' 10"  (177.8 cm) Weight: 121 lb 0.5 oz (54.9 kg) IBW/kg (Calculated) : 73 Heparin Dosing Weight: 54.9 kg   Vital Signs: Temp: 97.9 F (36.6 C) (01/27 0726) Temp Source: Oral (01/27 0726) BP: 158/77 (01/27 0726) Pulse Rate: 73 (01/27 0726)  Labs: Recent Labs    11/15/18 0505 11/16/18 0513 11/17/18 0508 11/17/18 1218  HGB 9.9* 10.5* 9.6*  --   HCT 30.6* 32.7* 30.0*  --   PLT 271 279 258  --   HEPARINUNFRC 0.36 0.42 0.28* 0.31  CREATININE  --   --  0.77  --     Estimated Creatinine Clearance: 73.4 mL/min (by C-G formula based on SCr of 0.77 mg/dL).   Medical History: Past Medical History:  Diagnosis Date  . Atrial fibrillation ([place])    per patient  . Cancer ([place])   . CHF (congestive heart failure) ([place])   . Chronic back pain   . Chronic leg pain   . Coronary artery disease   . Diabetes mellitus without complication ([place])   . Hypertension   . Neuropathy     Medications:    Assessment: Pharmacy consulted to dose heparin in this 63 year old male with ACS/NSTEMI.   Goal of Therapy:  Heparin level 0.3-0.7 units/ml Monitor platelets by anticoagulation protocol: Yes    Plan:  1/27 @  1218. HL: 0.31. Level is therapeutic x1, but just barely therapeutic. Will increase infusion to 1250 units/hr and recheck confirmatory level in 6 hours.   CBC with AM labs per protocol.    Pernell Dupre, PharmD, BCPS Clinical Pharmacist 11/17/2018 12:58 PM

## 2018-11-18 ENCOUNTER — Telehealth (INDEPENDENT_AMBULATORY_CARE_PROVIDER_SITE_OTHER): Payer: Self-pay | Admitting: Vascular Surgery

## 2018-11-18 ENCOUNTER — Encounter: Payer: Self-pay | Admitting: Vascular Surgery

## 2018-11-18 LAB — CBC
HCT: 28.3 % — ABNORMAL LOW (ref 39.0–52.0)
Hemoglobin: 9.1 g/dL — ABNORMAL LOW (ref 13.0–17.0)
MCH: 29.5 pg (ref 26.0–34.0)
MCHC: 32.2 g/dL (ref 30.0–36.0)
MCV: 91.9 fL (ref 80.0–100.0)
NRBC: 0 % (ref 0.0–0.2)
Platelets: 239 10*3/uL (ref 150–400)
RBC: 3.08 MIL/uL — ABNORMAL LOW (ref 4.22–5.81)
RDW: 13.7 % (ref 11.5–15.5)
WBC: 10 10*3/uL (ref 4.0–10.5)

## 2018-11-18 LAB — GLUCOSE, CAPILLARY
Glucose-Capillary: 238 mg/dL — ABNORMAL HIGH (ref 70–99)
Glucose-Capillary: 216 mg/dL — ABNORMAL HIGH (ref 70–99)

## 2018-11-18 MED ORDER — INSULIN ASPART 100 UNIT/ML ~~LOC~~ SOLN: 0.0000 [IU] | Freq: Every day | SUBCUTANEOUS | 11 refills | Status: DC

## 2018-11-18 MED ORDER — HYDROCODONE-ACETAMINOPHEN 5-325 MG PO TABS: 1.0000 | ORAL_TABLET | Freq: Four times a day (QID) | ORAL | 0 refills | Status: DC | PRN

## 2018-11-18 MED ORDER — ATORVASTATIN CALCIUM 40 MG PO TABS: 40.0000 mg | ORAL_TABLET | Freq: Every evening | ORAL | 2 refills | Status: DC

## 2018-11-18 MED ORDER — INSULIN DETEMIR 100 UNIT/ML ~~LOC~~ SOLN: 17.0000 [IU] | Freq: Every day | SUBCUTANEOUS | 1 refills | Status: DC

## 2018-11-18 NOTE — Progress Notes (Signed)
SUBJECTIVE: Patient denies any leg pain today  Vitals:   11/17/18 1630 11/17/18 1749 11/17/18 2312 11/18/18 0758  BP:  130/69 (!) 150/80 (!) 159/78  Pulse:  61 78 84  Resp: 12 18 16 18   Temp:   97.8 F (36.6 C) 98.6 F (37 C)  TempSrc:      SpO2:  100% 100% 100%  Weight:      Height:        Intake/Output Summary (Last 24 hours) at 11/18/2018 1338 Last data filed at 11/18/2018 3149 Gross per 24 hour  Intake 2060.34 ml  Output 1200 ml  Net 860.34 ml    LABS: Basic Metabolic Panel: Recent Labs    11/17/18 0508  NA 135  K 4.5  CL 102  CO2 29  GLUCOSE 232*  BUN 15  CREATININE 0.77  CALCIUM 8.5*  MG 2.1   Liver Function Tests: No results for input(s): AST, ALT, ALKPHOS, BILITOT, PROT, ALBUMIN in the last 72 hours. No results for input(s): LIPASE, AMYLASE in the last 72 hours. CBC: Recent Labs    11/17/18 0508 11/18/18 0313  WBC 9.0 10.0  HGB 9.6* 9.1*  HCT 30.0* 28.3*  MCV 90.9 91.9  PLT 258 239   Cardiac Enzymes: No results for input(s): CKTOTAL, CKMB, CKMBINDEX, TROPONINI in the last 72 hours. BNP: Invalid input(s): POCBNP D-Dimer: No results for input(s): DDIMER in the last 72 hours. Hemoglobin A1C: No results for input(s): HGBA1C in the last 72 hours. Fasting Lipid Panel: No results for input(s): CHOL, HDL, LDLCALC, TRIG, CHOLHDL, LDLDIRECT in the last 72 hours. Thyroid Function Tests: No results for input(s): TSH, T4TOTAL, T3FREE, THYROIDAB in the last 72 hours.  Invalid input(s): FREET3 Anemia Panel: No results for input(s): VITAMINB12, FOLATE, FERRITIN, TIBC, IRON, RETICCTPCT in the last 72 hours.   PHYSICAL EXAM General: Well developed, well nourished, in no acute distress HEENT:  Normocephalic and atramatic Neck:  No JVD.  Lungs: Clear bilaterally to auscultation and percussion. Heart: HRRR . Normal S1 and S2 without gallops or murmurs.  Abdomen: Bowel sounds are positive, abdomen soft and non-tender  Msk:  Back normal, normal gait.  Normal strength and tone for age. Extremities: No clubbing, cyanosis or edema.   Neuro: Alert and oriented X 3. Psych:  Good affect, responds appropriately  TELEMETRY: Sinus rhythm  ASSESSMENT AND PLAN: Normal left ventricular systolic function clinically doing well and can be discharged with follow-up in the office next week.  Active Problems:   Diabetes with hyperosmolar coma ([place])    Neoma Laming A, MD, [facility] 11/18/2018 1:38 PM

## 2018-11-18 NOTE — Discharge Instructions (Signed)
Remove dressing from groin and shower as of Wed. Keep groins clean and dry.

## 2018-11-18 NOTE — Evaluation (Signed)
Physical Therapy Evaluation Patient Details Name: Richard [name] MRN: [id number] DOB: 1956/05/26 Today's Date: 11/18/2018   History of Present Illness  presented to ER secondary to AMS, R sided numbness, L foot pain; admitted with acute encephalopathy, hyponatremia due to DM with hyperosmolar coma.  Clinical Impression  Upon evaluation, patient alert and oriented; follows commands and demonstrates good effort with all mobility tasks.  Bilat UE/LE strength and ROM grossly symmetrical and WFL for basic transfers and gait; baseline sensory deficit mid-calf distally reported (L > R).  R transmet site well-healed.  Currently able to complete bed mobility with indep; sit/stand, basic transfers and gait (160') with RW, close sup and up/down stairs (8) with single rail, close sup.  Demonstrates reciprocal stepping pattern with increased R lateral sway, but no overt buckling or LOB. Do recommend continued use of RW for optimal safety/indep at this time. Patient comfortable with functional performance and feels at baseline for him; no acute PT need identified at this time.  Patient in agreement. Will complete initial order at this time; please re-consult as needs change.     Follow Up Recommendations No PT follow up    Equipment Recommendations  (has necessary equipment)    Recommendations for Other Services       Precautions / Restrictions Precautions Precautions: Fall Restrictions Weight Bearing Restrictions: No      Mobility  Bed Mobility Overal bed mobility: Independent                Transfers Overall transfer level: Needs assistance Equipment used: Rolling walker (2 wheeled) Transfers: Sit to/from Stand Sit to Stand: Supervision            Ambulation/Gait Ambulation/Gait assistance: Supervision Gait Distance (Feet): 160 Feet Assistive device: Rolling walker (2 wheeled)       General Gait Details: reciprocal stepping pattern with increased R lateral sway, but no  overt buckling or LOB. Do recommend continued use of RW for optimal safety/indep at this time.  Stairs Stairs: Yes Stairs assistance: Supervision Stair Management: One rail Left Number of Stairs: 8 General stair comments: step to gait pattern with fair/good knee control and dynamic balance; does require UE support on handrail for optimal safety  Wheelchair Mobility    Modified Rankin (Stroke Patients Only)       Balance Overall balance assessment: Needs assistance Sitting-balance support: No upper extremity supported;Feet supported Sitting balance-Leahy Scale: Good     Standing balance support: No upper extremity supported;Single extremity supported Standing balance-Leahy Scale: Fair                               Pertinent Vitals/Pain Pain Assessment: 0-10 Pain Score: 7  Pain Location: L LE Pain Descriptors / Indicators: Aching Pain Intervention(s): Limited activity within patient's tolerance;Monitored during session;Repositioned;Premedicated before session    Home Living Family/patient expects to be discharged to:: Private residence Living Arrangements: (fiancee and daughter) Available Help at Discharge: Family;Available PRN/intermittently Type of Home: Apartment Home Access: Stairs to enter Entrance Stairs-Rails: Left Entrance Stairs-Number of Steps: 14 Home Layout: One level Home Equipment: Cane - single point;Walker - 2 wheels      Prior Function Level of Independence: Independent with assistive device(s)         Comments: Mod indep with SPC for ADLs, household mobilization; very limited out of house mobility.  Celesta Gentile and daughter assist with community needs, errands.     Hand Dominance  Extremity/Trunk Assessment   Upper Extremity Assessment Upper Extremity Assessment: Overall WFL for tasks assessed    Lower Extremity Assessment Lower Extremity Assessment: Generalized weakness(grossly at least 4-/5 throughout bilat LEs;  baseline sensory changes mid-calf distally bilat LEs; R transmet amp site)       Communication   Communication: No difficulties  Cognition Arousal/Alertness: Awake/alert Behavior During Therapy: WFL for tasks assessed/performed Overall Cognitive Status: Within Functional Limits for tasks assessed                                        General Comments      Exercises Other Exercises Other Exercises: Verbally reviewed safety recommendations (use of RW) and technique for car transfer; patient voiced understanding and awareness of all recommendations.   Assessment/Plan    PT Assessment Patent does not need any further PT services  PT Problem List         PT Treatment Interventions      PT Goals (Current goals can be found in the Care Plan section)  Acute Rehab PT Goals Patient Stated Goal: to return home PT Goal Formulation: All assessment and education complete, DC therapy Time For Goal Achievement: 11/18/18 Potential to Achieve Goals: Good    Frequency     Barriers to discharge        Co-evaluation               AM-PAC PT "6 Clicks" Mobility  Outcome Measure Help needed turning from your back to your side while in a flat bed without using bedrails?: None Help needed moving from lying on your back to sitting on the side of a flat bed without using bedrails?: None Help needed moving to and from a bed to a chair (including a wheelchair)?: A Little Help needed standing up from a chair using your arms (e.g., wheelchair or bedside chair)?: A Little Help needed to walk in hospital room?: A Little Help needed climbing 3-5 steps with a railing? : A Little 6 Click Score: 20    End of Session Equipment Utilized During Treatment: Gait belt Activity Tolerance: Patient tolerated treatment well Patient left: in chair;with call bell/phone within reach(refused chair alarm, to call staff when assist needed; RN informed/aware) Nurse Communication: Mobility  status PT Visit Diagnosis: Muscle weakness (generalized) (M62.81);Difficulty in walking, not elsewhere classified (R26.2)    Time: 2010-0712 PT Time Calculation (min) (ACUTE ONLY): 21 min   Charges:   PT Evaluation $PT Eval Moderate Complexity: 1 Mod PT Treatments $Therapeutic Activity: 8-22 mins        Laquonda Welby H. Owens Shark, PT, DPT, NCS 11/18/18, 11:29 AM [phone]

## 2018-11-18 NOTE — Progress Notes (Signed)
Inpatient Diabetes Program Recommendations  AACE/ADA: New Consensus Statement on Inpatient Glycemic Control (2015)  Target Ranges:  Prepandial:   less than 140 mg/dL      Peak postprandial:   less than 180 mg/dL (1-2 hours)      Critically ill patients:  140 - 180 mg/dL   Results for Richard [name], Richard [name] (MRN [id number]) as of 11/18/2018 13:56  Ref. Range 11/15/2018 07:35 11/15/2018 11:38 11/15/2018 16:41 11/15/2018 21:07  Glucose-Capillary Latest Ref Range: 70 - 99 mg/dL 199 (H)  5 units NOVOLOG  191 (H)  5 units NOVOLOG +  14 units LEVEMIR  174 (H)  5 units NOVOLOG  156 (H)   Results for Richard [name], Richard [name] (MRN [id number]) as of 11/18/2018 13:56  Ref. Range 11/16/2018 07:55 11/16/2018 11:47 11/16/2018 16:57 11/16/2018 21:04  Glucose-Capillary Latest Ref Range: 70 - 99 mg/dL 250 (H)  6 units NOVOLOG  262 (H)  8 units NOVOLOG +  16 units LEVEMIR  263 (H)  8 units NOVOLOG  285 (H)  3 units NOVOLOG    Results for Richard [name], Richard [name] (MRN [id number]) as of 11/18/2018 13:56  Ref. Range 11/17/2018 07:29 11/17/2018 11:41 11/17/2018 14:00 11/17/2018 17:05 11/17/2018 17:45 11/17/2018 21:05  Glucose-Capillary Latest Ref Range: 70 - 99 mg/dL 214 (H)  3 units NOVOLOG  186 (H)  2 units NOVOLOG +  16 units LEVEMIR  111 (H) 52 (L) 93 99   Results for Richard [name], Richard [name] (MRN [id number]) as of 11/18/2018 13:56  Ref. Range 11/18/2018 07:38 11/18/2018 11:39  Glucose-Capillary Latest Ref Range: 70 - 99 mg/dL 238 (H)  6 units NOVOLOG   216 (H)  6 units NOVOLOG +  16 units LEVEMIR    Home DM Meds: Levemir 17 units Daily Novolog 5 units TID with meals  Current Orders: Levemir 16 units Daily                            Novolog Sensitive Correction Scale/ SSI (0-9 units) TID AC + HS                            Novolog 3 units TID with meals     MD- Please consider the following in-hospital insulin adjustments:  1. Increase Levemir to 18 units Daily (CBG 238 mg/dl this AM)--If  dose already given this AM, please place order to give an extra 2 units Levemir this AM  2. Increase Novolog Meal Coverage to: Novolog 5 units TID with meals (Home Dose)  (Please add the following Hold Parameters: Hold if pt eats <50% of meal, Hold if pt NPO)    --Will follow patient during hospitalization--  Wyn Quaker RN, MSN, CDE Diabetes Coordinator Inpatient Glycemic Control Team Team Pager: [phone] (8a-5p)

## 2018-11-18 NOTE — Discharge Summary (Signed)
[facility] [place] [facility]  Richard [name], 62 y.o., DOB 10/08/1956, MRN [id number]. Admission date: 11/11/2018 Discharge Date 11/18/2018 Primary MD [facility] Admitting Physician Richard Wieting, MD  Admission Diagnosis  Dehydration [E86.0] HHNC (hyperglycemic hyperosmolar nonketotic coma) (HCC) [E11.01] Elevated lactic acid level [R79.89] Altered mental status, unspecified altered mental status type [R41.82]  Discharge Diagnosis   Active Problems:   Diabetes with hyperosmolar coma (HCC) Left foot pain with peripheral vascular disease Atrial fibrillation Diabetes type 2 with medical noncompliance Acute metabolic encephalopathy due to hypoglycemia now resolved Bradycardia due to medications Hyponatremia Lactic acidosis History of coronary artery disease History of pancreatic cancer   Hospital Course Richard [name]  is a 62 y.o. male with a known history of diabetes.  Presents with altered mental status.  Patient was noted to have severe hyperglycemia which was treated to the ICU with insulin drip.  Patient also was confused presentation that improved with his blood sugars.  Patient also is complaining of significant pain in the left foot.  He was seen by vascular surgery and underwent angioplasty and a stent placement.  Patient is stable for discharge with outpatient vascular follow-up.             Consults  cardiology, vascular surgery  Significant Tests:  See full reports for all details     Ct Head Wo Contrast  Result Date: 11/11/2018 CLINICAL DATA:  Altered level of consciousness EXAM: CT HEAD WITHOUT CONTRAST TECHNIQUE: Contiguous axial images were obtained from the base of the skull through the vertex without intravenous contrast. COMPARISON:  September 20, 2018 FINDINGS: Brain: No evidence of acute infarction, hemorrhage, hydrocephalus, extra-axial collection or mass lesion/mass effect. There is chronic diffuse atrophy.  Chronic bilateral periventricular white matter small vessel ischemic changes noted. Vascular: No hyperdense vessel is noted. Skull: Normal. Negative for fracture or focal lesion. Sinuses/Orbits: Mucoperiosteal thickening of bilateral ethmoid, bilateral frontal and anterior left sphenoid sinus are noted. The orbits are normal. Other: None. IMPRESSION: No focal acute intracranial abnormality identified. Chronic diffuse atrophy. Chronic bilateral periventricular white matter small vessel ischemic change. Electronically Signed   By: Wei-Chen  Lin M.D.   On: 11/11/2018 07:32   Dg Chest Portable 1 View  Result Date: 11/11/2018 CLINICAL DATA:  62-year-old male with right side weakness onset around midnight. Shortness of breath. EXAM: PORTABLE CHEST 1 VIEW COMPARISON:  Portable chest 09/20/2018 and earlier. FINDINGS: Portable AP upright view at 0606 hours. Lung volumes and mediastinal contours are within normal limits. Allowing for portable technique the lungs are clear. Visualized tracheal air column is within normal limits. No pneumothorax. No acute osseous abnormality identified. IMPRESSION: Negative.  No acute cardiopulmonary abnormality. Electronically Signed   By: H  Hall M.D.   On: 11/11/2018 06:45       Today   Subjective:   Richard [name] patient doing better complains of pain in his legs but improved Objective:   Blood pressure (!) 159/78, pulse 84, temperature 98.6 F (37 C), resp. rate 18, height 5' 10" (1.778 m), weight 54.9 kg, SpO2 100 %.  .  Intake/Output Summary (Last 24 hours) at 11/18/2018 1412 Last data filed at 11/18/2018 0927 Gross per 24 hour  Intake 2060.34 ml  Output 1200 ml  Net 860.34 ml    Exam VITAL SIGNS: Blood pressure (!) 159/78, pulse 84, temperature 98.6 F (37 C), resp. rate 18, height 5' 10" (1.778 m), weight 54.9 kg, SpO2 100 %.  GENERAL:  62 y.o.-year-old patient lying   in the bed with no acute distress.  EYES: Pupils equal, round, reactive to light and  accommodation. No scleral icterus. Extraocular muscles intact.  HEENT: Head atraumatic, normocephalic. Oropharynx and nasopharynx clear.  NECK:  Supple, no jugular venous distention. No thyroid enlargement, no tenderness.  LUNGS: Normal breath sounds bilaterally, no wheezing, rales,rhonchi or crepitation. No use of accessory muscles of respiration.  CARDIOVASCULAR: S1, S2 normal. No murmurs, rubs, or gallops.  ABDOMEN: Soft, nontender, nondistended. Bowel sounds present. No organomegaly or mass.  EXTREMITIES: No pedal edema, cyanosis, or clubbing.  NEUROLOGIC: Cranial nerves II through XII are intact. Muscle strength 5/5 in all extremities. Sensation intact. Gait not checked.  PSYCHIATRIC: The patient is alert and oriented x 3.  SKIN: No obvious rash, lesion, or ulcer.   Data Review     CBC w Diff:  Lab Results  Component Value Date   WBC 10.0 11/18/2018   HGB 9.1 (L) 11/18/2018   HCT 28.3 (L) 11/18/2018   PLT 239 11/18/2018   LYMPHOPCT 39 11/11/2018   MONOPCT 10 11/11/2018   EOSPCT 4 11/11/2018   BASOPCT 0 11/11/2018   CMP:  Lab Results  Component Value Date   NA 135 11/17/2018   K 4.5 11/17/2018   CL 102 11/17/2018   CO2 29 11/17/2018   BUN 15 11/17/2018   CREATININE 0.77 11/17/2018   PROT 7.4 11/11/2018   ALBUMIN 3.8 11/11/2018   BILITOT 0.7 11/11/2018   ALKPHOS 31 (L) 11/11/2018   AST 17 11/11/2018   ALT 13 11/11/2018  .  Micro Results Recent Results (from the past 240 hour(s))  MRSA PCR Screening     Status: None   Collection Time: 11/11/18  9:09 AM  Result Value Ref Range Status   MRSA by PCR NEGATIVE NEGATIVE Final    Comment:        The GeneXpert MRSA Assay (FDA approved for NASAL specimens only), is one component of a comprehensive MRSA colonization surveillance program. It is not intended to diagnose MRSA infection nor to guide or monitor treatment for MRSA infections. Performed at [facility], [address] [place], [place]  [address]   Culture, blood (Routine X 2) w Reflex to ID Panel     Status: None   Collection Time: 11/11/18 12:38 PM  Result Value Ref Range Status   Specimen Description BLOOD BLOOD RIGHT HAND  Final   Special Requests   Final    BOTTLES DRAWN AEROBIC AND ANAEROBIC Blood Culture adequate volume   Culture   Final    NO GROWTH 5 DAYS Performed at [facility], [address] [place], [place] [address]    Report Status 11/16/2018 FINAL  Final  Culture, blood (Routine X 2) w Reflex to ID Panel     Status: None   Collection Time: 11/11/18 12:47 PM  Result Value Ref Range Status   Specimen Description BLOOD BLOOD RIGHT HAND  Final   Special Requests   Final    BOTTLES DRAWN AEROBIC AND ANAEROBIC Blood Culture results may not be optimal due to an excessive volume of blood received in culture bottles   Culture   Final    NO GROWTH 5 DAYS Performed at [facility], [address] [place], [place] [address]    Report Status 11/16/2018 FINAL  Final  Surgical pcr screen     Status: Abnormal   Collection Time: 11/16/18 10:41 PM  Result Value Ref Range Status   MRSA, PCR NEGATIVE NEGATIVE Final     Staphylococcus aureus POSITIVE (A) NEGATIVE Final    Comment: (NOTE) The Xpert SA Assay (FDA approved for NASAL specimens in patients 22 years of age and older), is one component of a comprehensive surveillance program. It is not intended to diagnose infection nor to guide or monitor treatment. Performed at [facility], [address] [place], [place] [address]         Code Status Orders  (From admission, onward)         Start     Ordered   11/11/18 0758  Do not attempt resuscitation (DNR)  Continuous    Question Answer Comment  In the event of cardiac or respiratory ARREST Do not call a "code blue"   In the event of cardiac or respiratory ARREST Do not perform Intubation, CPR, defibrillation or ACLS   In the event of cardiac or respiratory ARREST Use  medication by any route, position, wound care, and other measures to relive pain and suffering. May use oxygen, suction and manual treatment of airway obstruction as needed for comfort.   Comments nurse may pronounce      11/11/18 0758        Code Status History    Date Active Date Inactive Code Status Order ID Comments User Context   09/20/2018 1635 09/24/2018 1854 Full Code [id number]  , Sona, MD ED   08/03/2018 1020 08/04/2018 1813 Full Code [id number]  Salary, Montell D, MD ED   01/09/2018 1025 01/11/2018 1932 Full Code [id number]  Diamond, Michael S, MD Inpatient   09/05/2017 1559 09/11/2017 1928 Full Code [id number]  , Sona, MD Inpatient   08/24/2017 0238 08/29/2017 2131 Full Code [id number]  Willis, David, MD Inpatient   08/10/2015 0822 08/11/2015 1947 Full Code [id number]  Diamond, Michael S, MD Inpatient    Advance Directive Documentation     Most Recent Value  Type of Advance Directive  Living will  Pre-existing out of facility DNR order (yellow form or pink MOST form)  -  "MOST" Form in Place?  -          Follow-up Information    [facility] Follow up in 3 day(s).   Specialty:  General Practice Why:  hosp f/u Contact information: [address] [place] [address] [phone]        Dew, Jason S, MD Follow up in 1 month(s).   Specialties:  Vascular Surgery, Radiology, Interventional Cardiology Why:  Can see Dew or [facility]. Angiogram F/U. Will need ABI with appointment.  Contact information: [address] [place] [address] [phone]           Discharge Medications   Allergies as of 11/18/2018   No Known Allergies     Medication List    STOP taking these medications   metoprolol tartrate 25 MG tablet Commonly known as:  LOPRESSOR     TAKE these medications   ACCU-CHEK AVIVA PLUS test strip Generic drug:  glucose blood U ONCE TO BID UTD   ACCU-CHEK AVIVA PLUS w/Device Kit U TO TEST ONCE  TO BID   ACCU-CHEK SOFTCLIX LANCETS lancets TEST 1-2 XD   acetaminophen 325 MG tablet Commonly known as:  TYLENOL Take 1 tablet (325 mg total) by mouth every 6 (six) hours as needed for mild pain (or Fever >/= 101).   amitriptyline 50 MG tablet Commonly known as:  ELAVIL Take 1 tablet (50 mg total) by mouth at bedtime.   apixaban 5 MG Tabs tablet Commonly known   as:  ELIQUIS Take 1 tablet (5 mg total) by mouth 2 (two) times daily.   aspirin 81 MG chewable tablet Chew 1 tablet (81 mg total) by mouth daily.   atorvastatin 40 MG tablet Commonly known as:  LIPITOR Take 1 tablet (40 mg total) by mouth every evening.   gabapentin 100 MG capsule Commonly known as:  NEURONTIN Take 1 capsule (100 mg total) by mouth 3 (three) times daily.   HYDROcodone-acetaminophen 5-325 MG tablet Commonly known as:  NORCO/VICODIN Take 1-2 tablets by mouth every 6 (six) hours as needed for moderate pain.   insulin aspart 100 UNIT/ML injection Commonly known as:  novoLOG Inject 0-5 Units into the skin at bedtime. CBG < 70: implement hypoglycemia protocol CBG 70 - 120: 0 units CBG 121 - 150: 0 units CBG 151 - 200: 0 units CBG 201 - 250: 2 units CBG 251 - 300: 3 units CBG 301 - 350: 4 units CBG 351 - 400: 5 units CBG > 400: call MD What changed:  Another medication with the same name was removed. Continue taking this medication, and follow the directions you see here.   insulin detemir 100 UNIT/ML injection Commonly known as:  LEVEMIR Inject 0.17 mLs (17 Units total) into the skin daily.   multivitamin with minerals Tabs tablet Take 1 tablet by mouth daily.   Needles & Syringes Misc Use as directed   traZODone 50 MG tablet Commonly known as:  DESYREL Take 1 tablet (50 mg total) by mouth at bedtime as needed for sleep.          Total Time in preparing paper work, data evaluation and todays exam - 35 minutes    M.D on 11/18/2018 at 2:12 PM [facility]   Office   [phone] 

## 2018-11-20 ENCOUNTER — Telehealth (INDEPENDENT_AMBULATORY_CARE_PROVIDER_SITE_OTHER): Payer: Self-pay | Admitting: Vascular Surgery

## 2018-11-20 NOTE — Telephone Encounter (Signed)
Need 1 mth [facility] fu abi / jd

## 2018-11-25 NOTE — Progress Notes (Deleted)
NO SHOW

## 2018-11-27 ENCOUNTER — Ambulatory Visit: Payer: Medicare Other | Admitting: Cardiovascular Disease

## 2019-02-28 ENCOUNTER — Emergency Department: Payer: Medicare Other

## 2019-02-28 ENCOUNTER — Inpatient Hospital Stay
Admission: EM | Admit: 2019-02-28 | Discharge: 2019-03-19 | DRG: 853 | Disposition: A | Discharge status: Skilled Nursing Facility | Payer: Medicare Other | Attending: Internal Medicine | Admitting: Internal Medicine

## 2019-02-28 DIAGNOSIS — G9341 Metabolic encephalopathy: Secondary | ICD-10-CM | POA: Diagnosis present

## 2019-02-28 DIAGNOSIS — L89152 Pressure ulcer of sacral region, stage 2: Secondary | ICD-10-CM | POA: Diagnosis not present

## 2019-02-28 DIAGNOSIS — J9600 Acute respiratory failure, unspecified whether with hypoxia or hypercapnia: Secondary | ICD-10-CM | POA: Diagnosis present

## 2019-02-28 DIAGNOSIS — Z66 Do not resuscitate: Secondary | ICD-10-CM | POA: Diagnosis present

## 2019-02-28 DIAGNOSIS — I959 Hypotension, unspecified: Secondary | ICD-10-CM | POA: Diagnosis not present

## 2019-02-28 DIAGNOSIS — L8990 Pressure ulcer of unspecified site, unspecified stage: Secondary | ICD-10-CM | POA: Diagnosis not present

## 2019-02-28 DIAGNOSIS — Z833 Family history of diabetes mellitus: Secondary | ICD-10-CM

## 2019-02-28 DIAGNOSIS — N179 Acute kidney failure, unspecified: Secondary | ICD-10-CM | POA: Diagnosis present

## 2019-02-28 DIAGNOSIS — F17210 Nicotine dependence, cigarettes, uncomplicated: Secondary | ICD-10-CM | POA: Diagnosis not present

## 2019-02-28 DIAGNOSIS — Z794 Long term (current) use of insulin: Secondary | ICD-10-CM

## 2019-02-28 DIAGNOSIS — Z7982 Long term (current) use of aspirin: Secondary | ICD-10-CM

## 2019-02-28 DIAGNOSIS — Z8507 Personal history of malignant neoplasm of pancreas: Secondary | ICD-10-CM | POA: Diagnosis not present

## 2019-02-28 DIAGNOSIS — Z90411 Acquired partial absence of pancreas: Secondary | ICD-10-CM

## 2019-02-28 DIAGNOSIS — E1151 Type 2 diabetes mellitus with diabetic peripheral angiopathy without gangrene: Secondary | ICD-10-CM | POA: Diagnosis not present

## 2019-02-28 DIAGNOSIS — Z20828 Contact with and (suspected) exposure to other viral communicable diseases: Secondary | ICD-10-CM | POA: Diagnosis present

## 2019-02-28 DIAGNOSIS — B964 Proteus (mirabilis) (morganii) as the cause of diseases classified elsewhere: Secondary | ICD-10-CM | POA: Diagnosis present

## 2019-02-28 DIAGNOSIS — I2119 ST elevation (STEMI) myocardial infarction involving other coronary artery of inferior wall: Secondary | ICD-10-CM | POA: Diagnosis present

## 2019-02-28 DIAGNOSIS — Z9081 Acquired absence of spleen: Secondary | ICD-10-CM

## 2019-02-28 DIAGNOSIS — G9340 Encephalopathy, unspecified: Secondary | ICD-10-CM | POA: Diagnosis not present

## 2019-02-28 DIAGNOSIS — E11621 Type 2 diabetes mellitus with foot ulcer: Secondary | ICD-10-CM | POA: Diagnosis not present

## 2019-02-28 DIAGNOSIS — I2510 Atherosclerotic heart disease of native coronary artery without angina pectoris: Secondary | ICD-10-CM | POA: Diagnosis present

## 2019-02-28 DIAGNOSIS — Z89429 Acquired absence of other toe(s), unspecified side: Secondary | ICD-10-CM | POA: Diagnosis not present

## 2019-02-28 DIAGNOSIS — T82868A Thrombosis of vascular prosthetic devices, implants and grafts, initial encounter: Secondary | ICD-10-CM | POA: Diagnosis not present

## 2019-02-28 DIAGNOSIS — E1111 Type 2 diabetes mellitus with ketoacidosis with coma: Secondary | ICD-10-CM | POA: Diagnosis present

## 2019-02-28 DIAGNOSIS — Z8249 Family history of ischemic heart disease and other diseases of the circulatory system: Secondary | ICD-10-CM | POA: Diagnosis not present

## 2019-02-28 DIAGNOSIS — Z7189 Other specified counseling: Secondary | ICD-10-CM | POA: Diagnosis not present

## 2019-02-28 DIAGNOSIS — R509 Fever, unspecified: Secondary | ICD-10-CM | POA: Diagnosis not present

## 2019-02-28 DIAGNOSIS — I70262 Atherosclerosis of native arteries of extremities with gangrene, left leg: Secondary | ICD-10-CM | POA: Diagnosis present

## 2019-02-28 DIAGNOSIS — I743 Embolism and thrombosis of arteries of the lower extremities: Secondary | ICD-10-CM | POA: Diagnosis present

## 2019-02-28 DIAGNOSIS — Z515 Encounter for palliative care: Secondary | ICD-10-CM | POA: Diagnosis not present

## 2019-02-28 DIAGNOSIS — A4159 Other Gram-negative sepsis: Secondary | ICD-10-CM | POA: Diagnosis present

## 2019-02-28 DIAGNOSIS — D649 Anemia, unspecified: Secondary | ICD-10-CM | POA: Diagnosis not present

## 2019-02-28 DIAGNOSIS — Z9889 Other specified postprocedural states: Secondary | ICD-10-CM | POA: Diagnosis not present

## 2019-02-28 DIAGNOSIS — J9601 Acute respiratory failure with hypoxia: Secondary | ICD-10-CM | POA: Diagnosis present

## 2019-02-28 DIAGNOSIS — F17200 Nicotine dependence, unspecified, uncomplicated: Secondary | ICD-10-CM | POA: Diagnosis not present

## 2019-02-28 DIAGNOSIS — I10 Essential (primary) hypertension: Secondary | ICD-10-CM | POA: Diagnosis not present

## 2019-02-28 DIAGNOSIS — L89322 Pressure ulcer of left buttock, stage 2: Secondary | ICD-10-CM | POA: Diagnosis not present

## 2019-02-28 DIAGNOSIS — F1410 Cocaine abuse, uncomplicated: Secondary | ICD-10-CM | POA: Diagnosis present

## 2019-02-28 DIAGNOSIS — I480 Paroxysmal atrial fibrillation: Secondary | ICD-10-CM | POA: Diagnosis present

## 2019-02-28 DIAGNOSIS — R7881 Bacteremia: Secondary | ICD-10-CM | POA: Diagnosis not present

## 2019-02-28 DIAGNOSIS — E43 Unspecified severe protein-calorie malnutrition: Secondary | ICD-10-CM | POA: Diagnosis present

## 2019-02-28 DIAGNOSIS — A419 Sepsis, unspecified organism: Secondary | ICD-10-CM | POA: Diagnosis not present

## 2019-02-28 DIAGNOSIS — D638 Anemia in other chronic diseases classified elsewhere: Secondary | ICD-10-CM | POA: Diagnosis present

## 2019-02-28 DIAGNOSIS — E1152 Type 2 diabetes mellitus with diabetic peripheral angiopathy with gangrene: Secondary | ICD-10-CM | POA: Diagnosis present

## 2019-02-28 DIAGNOSIS — L97529 Non-pressure chronic ulcer of other part of left foot with unspecified severity: Secondary | ICD-10-CM | POA: Diagnosis not present

## 2019-02-28 DIAGNOSIS — E876 Hypokalemia: Secondary | ICD-10-CM | POA: Diagnosis present

## 2019-02-28 DIAGNOSIS — E11649 Type 2 diabetes mellitus with hypoglycemia without coma: Secondary | ICD-10-CM | POA: Diagnosis not present

## 2019-02-28 DIAGNOSIS — E785 Hyperlipidemia, unspecified: Secondary | ICD-10-CM | POA: Diagnosis present

## 2019-02-28 DIAGNOSIS — F39 Unspecified mood [affective] disorder: Secondary | ICD-10-CM | POA: Diagnosis present

## 2019-02-28 DIAGNOSIS — R680 Hypothermia, not associated with low environmental temperature: Secondary | ICD-10-CM | POA: Diagnosis present

## 2019-02-28 DIAGNOSIS — E1311 Other specified diabetes mellitus with ketoacidosis with coma: Secondary | ICD-10-CM

## 2019-02-28 DIAGNOSIS — Z79899 Other long term (current) drug therapy: Secondary | ICD-10-CM | POA: Diagnosis not present

## 2019-02-28 DIAGNOSIS — Z955 Presence of coronary angioplasty implant and graft: Secondary | ICD-10-CM | POA: Diagnosis not present

## 2019-02-28 DIAGNOSIS — E871 Hypo-osmolality and hyponatremia: Secondary | ICD-10-CM | POA: Diagnosis present

## 2019-02-28 DIAGNOSIS — Z7901 Long term (current) use of anticoagulants: Secondary | ICD-10-CM

## 2019-02-28 DIAGNOSIS — Y838 Other surgical procedures as the cause of abnormal reaction of the patient, or of later complication, without mention of misadventure at the time of the procedure: Secondary | ICD-10-CM | POA: Diagnosis not present

## 2019-02-28 DIAGNOSIS — E1011 Type 1 diabetes mellitus with ketoacidosis with coma: Secondary | ICD-10-CM | POA: Diagnosis not present

## 2019-02-28 DIAGNOSIS — Z95820 Peripheral vascular angioplasty status with implants and grafts: Secondary | ICD-10-CM | POA: Diagnosis not present

## 2019-02-28 DIAGNOSIS — J9690 Respiratory failure, unspecified, unspecified whether with hypoxia or hypercapnia: Secondary | ICD-10-CM | POA: Diagnosis present

## 2019-02-28 DIAGNOSIS — Z6821 Body mass index (BMI) 21.0-21.9, adult: Secondary | ICD-10-CM

## 2019-02-28 DIAGNOSIS — D72829 Elevated white blood cell count, unspecified: Secondary | ICD-10-CM | POA: Diagnosis not present

## 2019-02-28 DIAGNOSIS — G92 Toxic encephalopathy: Secondary | ICD-10-CM | POA: Diagnosis not present

## 2019-02-28 DIAGNOSIS — E870 Hyperosmolality and hypernatremia: Secondary | ICD-10-CM | POA: Diagnosis not present

## 2019-02-28 DIAGNOSIS — I96 Gangrene, not elsewhere classified: Secondary | ICD-10-CM | POA: Diagnosis not present

## 2019-02-28 DIAGNOSIS — M25562 Pain in left knee: Secondary | ICD-10-CM | POA: Diagnosis not present

## 2019-02-28 DIAGNOSIS — M79672 Pain in left foot: Secondary | ICD-10-CM | POA: Diagnosis not present

## 2019-02-28 LAB — BLOOD GAS, ARTERIAL
Acid-base deficit: 22 mmol/L — ABNORMAL HIGH (ref 0.0–2.0)
Acid-base deficit: 2 mmol/L (ref 0.0–2.0)
Bicarbonate: 8 mmol/L — ABNORMAL LOW (ref 20.0–28.0)
Bicarbonate: 20.4 mmol/L (ref 20.0–28.0)
FIO2: 0.3
FIO2: 0.24
MECHVT: 500 mL
MECHVT: 500 mL
Mechanical Rate: 15
O2 Saturation: 98.7 %
O2 Saturation: 99.1 %
PEEP: 5 cmH2O
PEEP: 5 cmH2O
Patient temperature: 37
Patient temperature: 37
RATE: 15 resp/min
pCO2 arterial: 31 mmHg — ABNORMAL LOW (ref 32.0–48.0)
pCO2 arterial: 28 mmHg — ABNORMAL LOW (ref 32.0–48.0)
pH, Arterial: 7.02 — CL (ref 7.350–7.450)
pH, Arterial: 7.47 — ABNORMAL HIGH (ref 7.350–7.450)
pO2, Arterial: 169 mmHg — ABNORMAL HIGH (ref 83.0–108.0)
pO2, Arterial: 127 mmHg — ABNORMAL HIGH (ref 83.0–108.0)

## 2019-02-28 LAB — COMPREHENSIVE METABOLIC PANEL
ALT: 14 U/L (ref 0–44)
AST: 23 U/L (ref 15–41)
Albumin: 2.3 g/dL — ABNORMAL LOW (ref 3.5–5.0)
Alkaline Phosphatase: 40 U/L (ref 38–126)
Anion gap: 32 — ABNORMAL HIGH (ref 5–15)
BUN: 72 mg/dL — ABNORMAL HIGH (ref 8–23)
CO2: 8 mmol/L — ABNORMAL LOW (ref 22–32)
Calcium: 7.6 mg/dL — ABNORMAL LOW (ref 8.9–10.3)
Chloride: 101 mmol/L (ref 98–111)
Creatinine, Ser: 2.73 mg/dL — ABNORMAL HIGH (ref 0.61–1.24)
GFR calc Af Amer: 27 mL/min — ABNORMAL LOW (ref >60)
GFR calc non Af Amer: 24 mL/min — ABNORMAL LOW (ref >60)
Glucose, Bld: 923 mg/dL (ref 70–99)
Potassium: 4.7 mmol/L (ref 3.5–5.1)
Sodium: 141 mmol/L (ref 135–145)
Total Bilirubin: 2.1 mg/dL — ABNORMAL HIGH (ref 0.3–1.2)
Total Protein: 5.9 g/dL — ABNORMAL LOW (ref 6.5–8.1)

## 2019-02-28 LAB — LIPASE, BLOOD: Lipase: 27 U/L (ref 11–51)

## 2019-02-28 LAB — URINE DRUG SCREEN, QUALITATIVE (ARMC ONLY)
Amphetamines, Ur Screen: NOT DETECTED
Barbiturates, Ur Screen: NOT DETECTED
Benzodiazepine, Ur Scrn: NOT DETECTED
Cannabinoid 50 Ng, Ur ~~LOC~~: NOT DETECTED
Cocaine Metabolite,Ur ~~LOC~~: POSITIVE — AB
MDMA (Ecstasy)Ur Screen: NOT DETECTED
Methadone Scn, Ur: NOT DETECTED
Opiate, Ur Screen: NOT DETECTED
Phencyclidine (PCP) Ur S: NOT DETECTED
Tricyclic, Ur Screen: NOT DETECTED

## 2019-02-28 LAB — BASIC METABOLIC PANEL
Anion gap: 22 — ABNORMAL HIGH (ref 5–15)
BUN: 83 mg/dL — ABNORMAL HIGH (ref 8–23)
CO2: 15 mmol/L — ABNORMAL LOW (ref 22–32)
Calcium: 7.7 mg/dL — ABNORMAL LOW (ref 8.9–10.3)
Chloride: 110 mmol/L (ref 98–111)
Creatinine, Ser: 2.69 mg/dL — ABNORMAL HIGH (ref 0.61–1.24)
GFR calc Af Amer: 28 mL/min — ABNORMAL LOW (ref >60)
GFR calc non Af Amer: 24 mL/min — ABNORMAL LOW (ref >60)
Glucose, Bld: 425 mg/dL — ABNORMAL HIGH (ref 70–99)
Potassium: 3.5 mmol/L (ref 3.5–5.1)
Sodium: 147 mmol/L — ABNORMAL HIGH (ref 135–145)

## 2019-02-28 LAB — ETHANOL: Alcohol, Ethyl (B): <10 mg/dL (ref <10)

## 2019-02-28 LAB — URINALYSIS, COMPLETE (UACMP) WITH MICROSCOPIC
Bacteria, UA: NONE SEEN
Bilirubin Urine: NEGATIVE
Glucose, UA: >=500 mg/dL — AB
Ketones, ur: 80 mg/dL — AB
Leukocytes,Ua: NEGATIVE
Nitrite: NEGATIVE
Protein, ur: 30 mg/dL — AB
Specific Gravity, Urine: 1.02 (ref 1.005–1.030)
pH: 5 (ref 5.0–8.0)

## 2019-02-28 LAB — HEPARIN LEVEL (UNFRACTIONATED): Heparin Unfractionated: 0.65 IU/mL (ref 0.30–0.70)

## 2019-02-28 LAB — CBC WITH DIFFERENTIAL/PLATELET
Abs Immature Granulocytes: 0.98 10*3/uL — ABNORMAL HIGH (ref 0.00–0.07)
Basophils Absolute: 0.1 10*3/uL (ref 0.0–0.1)
Basophils Relative: 0 %
Eosinophils Absolute: 0.1 10*3/uL (ref 0.0–0.5)
Eosinophils Relative: 0 %
HCT: 26.9 % — ABNORMAL LOW (ref 39.0–52.0)
Hemoglobin: 7.7 g/dL — ABNORMAL LOW (ref 13.0–17.0)
Immature Granulocytes: 4 %
Lymphocytes Relative: 7 %
Lymphs Abs: 1.5 10*3/uL (ref 0.7–4.0)
MCH: 27.1 pg (ref 26.0–34.0)
MCHC: 28.6 g/dL — ABNORMAL LOW (ref 30.0–36.0)
MCV: 94.7 fL (ref 80.0–100.0)
Monocytes Absolute: 1.7 10*3/uL — ABNORMAL HIGH (ref 0.1–1.0)
Monocytes Relative: 8 %
Neutro Abs: 18 10*3/uL — ABNORMAL HIGH (ref 1.7–7.7)
Neutrophils Relative %: 81 %
Platelets: 417 10*3/uL — ABNORMAL HIGH (ref 150–400)
RBC: 2.84 MIL/uL — ABNORMAL LOW (ref 4.22–5.81)
RDW: 16.2 % — ABNORMAL HIGH (ref 11.5–15.5)
WBC: 22.4 10*3/uL — ABNORMAL HIGH (ref 4.0–10.5)
nRBC: 0 % (ref 0.0–0.2)

## 2019-02-28 LAB — GLUCOSE, CAPILLARY
Glucose-Capillary: >600 mg/dL (ref 70–99)
Glucose-Capillary: >600 mg/dL (ref 70–99)
Glucose-Capillary: >600 mg/dL (ref 70–99)
Glucose-Capillary: >600 mg/dL (ref 70–99)
Glucose-Capillary: >600 mg/dL (ref 70–99)
Glucose-Capillary: >600 mg/dL (ref 70–99)
Glucose-Capillary: 529 mg/dL (ref 70–99)
Glucose-Capillary: 426 mg/dL — ABNORMAL HIGH (ref 70–99)
Glucose-Capillary: 412 mg/dL — ABNORMAL HIGH (ref 70–99)
Glucose-Capillary: 372 mg/dL — ABNORMAL HIGH (ref 70–99)
Glucose-Capillary: 338 mg/dL — ABNORMAL HIGH (ref 70–99)
Glucose-Capillary: 270 mg/dL — ABNORMAL HIGH (ref 70–99)

## 2019-02-28 LAB — CBC
HCT: 25.5 % — ABNORMAL LOW (ref 39.0–52.0)
Hemoglobin: 7.9 g/dL — ABNORMAL LOW (ref 13.0–17.0)
MCH: 26.7 pg (ref 26.0–34.0)
MCHC: 31 g/dL (ref 30.0–36.0)
MCV: 86.1 fL (ref 80.0–100.0)
Platelets: 370 10*3/uL (ref 150–400)
RBC: 2.96 MIL/uL — ABNORMAL LOW (ref 4.22–5.81)
RDW: 15.6 % — ABNORMAL HIGH (ref 11.5–15.5)
WBC: 18.8 10*3/uL — ABNORMAL HIGH (ref 4.0–10.5)
nRBC: 0 % (ref 0.0–0.2)

## 2019-02-28 LAB — MRSA PCR SCREENING: MRSA by PCR: NEGATIVE

## 2019-02-28 LAB — MAGNESIUM: Magnesium: 2.2 mg/dL (ref 1.7–2.4)

## 2019-02-28 LAB — PHOSPHORUS: Phosphorus: 3.8 mg/dL (ref 2.5–4.6)

## 2019-02-28 LAB — PROCALCITONIN: Procalcitonin: 0.23 ng/mL

## 2019-02-28 LAB — LACTIC ACID, PLASMA: Lactic Acid, Venous: 1.7 mmol/L (ref 0.5–1.9)

## 2019-02-28 LAB — PROTIME-INR
INR: 1.5 — ABNORMAL HIGH (ref 0.8–1.2)
Prothrombin Time: 18.2 seconds — ABNORMAL HIGH (ref 11.4–15.2)

## 2019-02-28 LAB — TROPONIN I
Troponin I: 2.31 ng/mL (ref <0.03)
Troponin I: 2.71 ng/mL (ref <0.03)
Troponin I: 4.13 ng/mL (ref <0.03)

## 2019-02-28 LAB — FIBRIN DERIVATIVES D-DIMER (ARMC ONLY): Fibrin derivatives D-dimer (ARMC): 930.31 ng/mL (FEU) — ABNORMAL HIGH (ref 0.00–499.00)

## 2019-02-28 LAB — APTT: aPTT: 135 seconds — ABNORMAL HIGH (ref 24–36)

## 2019-02-28 LAB — SARS CORONAVIRUS 2 BY RT PCR (HOSPITAL ORDER, PERFORMED IN ~~LOC~~ HOSPITAL LAB): SARS Coronavirus 2: NEGATIVE

## 2019-02-28 MED ORDER — MIDAZOLAM HCL 2 MG/2ML IJ SOLN
2.0000 mg | INTRAMUSCULAR | Status: DC | PRN
  Administered 2019-02-28 – 2019-03-01 (×3): 2 mg via INTRAVENOUS
  Filled 2019-02-28 (×3): qty 2

## 2019-02-28 MED ORDER — SODIUM CHLORIDE 0.9 % IV SOLN
2.0000 g | Freq: Once | INTRAVENOUS | Status: AC
  Administered 2019-02-28: 2 g via INTRAVENOUS
  Filled 2019-02-28: qty 2

## 2019-02-28 MED ORDER — FENTANYL CITRATE (PF) 100 MCG/2ML IJ SOLN
100.0000 ug | Freq: Once | INTRAMUSCULAR | Status: AC
  Administered 2019-02-28: 21:00:00 100 ug via INTRAVENOUS

## 2019-02-28 MED ORDER — SODIUM CHLORIDE 0.9 % IV BOLUS
2000.0000 mL | Freq: Once | INTRAVENOUS | Status: AC
  Administered 2019-02-28: 2000 mL via INTRAVENOUS

## 2019-02-28 MED ORDER — DEXTROSE-NACL 5-0.45 % IV SOLN: INTRAVENOUS | Status: DC

## 2019-02-28 MED ORDER — HEPARIN (PORCINE) 25000 UT/250ML-% IV SOLN
1050.0000 [IU]/h | INTRAVENOUS | Status: DC
  Administered 2019-02-28: 1200 [IU]/h via INTRAVENOUS
  Administered 2019-03-01 – 2019-03-03 (×3): 1050 [IU]/h via INTRAVENOUS
  Filled 2019-02-28 (×4): qty 250

## 2019-02-28 MED ORDER — DEXTROSE 50 % IV SOLN: 25.0000 mL | INTRAVENOUS | Status: DC | PRN

## 2019-02-28 MED ORDER — SODIUM CHLORIDE 0.9 % IV SOLN
1.0000 g | Freq: Two times a day (BID) | INTRAVENOUS | Status: DC
  Administered 2019-02-28: 1 g via INTRAVENOUS
  Filled 2019-02-28 (×3): qty 1

## 2019-02-28 MED ORDER — SODIUM BICARBONATE 8.4 % IV SOLN
INTRAVENOUS | Status: AC
  Filled 2019-02-28: qty 50

## 2019-02-28 MED ORDER — INSULIN REGULAR(HUMAN) IN NACL 100-0.9 UT/100ML-% IV SOLN
INTRAVENOUS | Status: DC
  Administered 2019-02-28: 5.4 [IU]/h via INTRAVENOUS
  Administered 2019-02-28: 28.2 [IU]/h via INTRAVENOUS
  Filled 2019-02-28 (×3): qty 100

## 2019-02-28 MED ORDER — APIXABAN 5 MG PO TABS: 5.0000 mg | ORAL_TABLET | Freq: Two times a day (BID) | ORAL | Status: DC

## 2019-02-28 MED ORDER — SODIUM BICARBONATE 8.4 % IV SOLN
50.0000 meq | Freq: Once | INTRAVENOUS | Status: AC
  Administered 2019-02-28: 50 meq via INTRAVENOUS
  Filled 2019-02-28: qty 50

## 2019-02-28 MED ORDER — SODIUM BICARBONATE 8.4 % IV SOLN
100.0000 meq | Freq: Once | INTRAVENOUS | Status: AC
  Administered 2019-02-28: 100 meq via INTRAVENOUS

## 2019-02-28 MED ORDER — VANCOMYCIN VARIABLE DOSE PER UNSTABLE RENAL FUNCTION (PHARMACIST DOSING): Status: DC

## 2019-02-28 MED ORDER — SODIUM CHLORIDE 0.9 % IV BOLUS
1000.0000 mL | Freq: Once | INTRAVENOUS | Status: AC
  Administered 2019-02-28: 1000 mL via INTRAVENOUS

## 2019-02-28 MED ORDER — POTASSIUM CHLORIDE 10 MEQ/100ML IV SOLN
10.0000 meq | INTRAVENOUS | Status: AC
  Administered 2019-02-28 (×2): 10 meq via INTRAVENOUS
  Filled 2019-02-28 (×2): qty 100

## 2019-02-28 MED ORDER — AMIODARONE IV BOLUS ONLY 150 MG/100ML
150.0000 mg | Freq: Once | INTRAVENOUS | Status: AC
  Administered 2019-02-28: 150 mg via INTRAVENOUS
  Filled 2019-02-28: qty 100

## 2019-02-28 MED ORDER — ORAL CARE MOUTH RINSE
15.0000 mL | OROMUCOSAL | Status: DC
  Administered 2019-02-28 – 2019-03-01 (×7): 15 mL via OROMUCOSAL

## 2019-02-28 MED ORDER — ROCURONIUM BROMIDE 50 MG/5ML IV SOLN
INTRAVENOUS | Status: DC | PRN
  Administered 2019-02-28: 70 mg via INTRAVENOUS

## 2019-02-28 MED ORDER — NOREPINEPHRINE 4 MG/250ML-% IV SOLN
0.0000 ug/min | INTRAVENOUS | Status: DC
  Administered 2019-02-28: 13 ug/min via INTRAVENOUS
  Administered 2019-02-28: 1 ug/min via INTRAVENOUS
  Administered 2019-03-01: 6 ug/min via INTRAVENOUS
  Filled 2019-02-28 (×3): qty 250

## 2019-02-28 MED ORDER — ACETAMINOPHEN 325 MG PO TABS: 650.0000 mg | ORAL_TABLET | ORAL | Status: DC | PRN

## 2019-02-28 MED ORDER — PANTOPRAZOLE SODIUM 40 MG IV SOLR
40.0000 mg | Freq: Every day | INTRAVENOUS | Status: DC
  Administered 2019-02-28: 40 mg via INTRAVENOUS
  Filled 2019-02-28: qty 40

## 2019-02-28 MED ORDER — ETOMIDATE 2 MG/ML IV SOLN
INTRAVENOUS | Status: DC | PRN
  Administered 2019-02-28: 20 mg via INTRAVENOUS

## 2019-02-28 MED ORDER — FENTANYL CITRATE (PF) 100 MCG/2ML IJ SOLN
50.0000 ug | INTRAMUSCULAR | Status: DC | PRN
  Administered 2019-02-28 – 2019-03-01 (×4): 100 ug via INTRAVENOUS
  Filled 2019-02-28 (×4): qty 2

## 2019-02-28 MED ORDER — SODIUM CHLORIDE 0.9 % IV SOLN: INTRAVENOUS | Status: AC

## 2019-02-28 MED ORDER — MIDAZOLAM 50MG/50ML (1MG/ML) PREMIX INFUSION
0.5000 mg/h | INTRAVENOUS | Status: DC
  Administered 2019-02-28: 0.5 mg/h via INTRAVENOUS
  Filled 2019-02-28: qty 50

## 2019-02-28 MED ORDER — SODIUM BICARBONATE 8.4 % IV SOLN
100.0000 meq | Freq: Once | INTRAVENOUS | Status: AC
  Administered 2019-02-28: 100 meq via INTRAVENOUS
  Filled 2019-02-28: qty 100

## 2019-02-28 MED ORDER — ALBUTEROL SULFATE (2.5 MG/3ML) 0.083% IN NEBU
2.5000 mg | INHALATION_SOLUTION | RESPIRATORY_TRACT | Status: DC
  Administered 2019-02-28 – 2019-03-01 (×5): 2.5 mg via RESPIRATORY_TRACT
  Filled 2019-02-28 (×5): qty 3

## 2019-02-28 MED ORDER — INSULIN REGULAR(HUMAN) IN NACL 100-0.9 UT/100ML-% IV SOLN
INTRAVENOUS | Status: DC
  Administered 2019-02-28: 27 [IU]/h via INTRAVENOUS
  Administered 2019-03-01: 23.1 [IU]/h via INTRAVENOUS
  Filled 2019-02-28: qty 100

## 2019-02-28 MED ORDER — MIDAZOLAM BOLUS VIA INFUSION
4.0000 mg | Freq: Once | INTRAVENOUS | Status: AC
  Administered 2019-02-28: 4 mg via INTRAVENOUS
  Filled 2019-02-28: qty 4

## 2019-02-28 MED ORDER — HEPARIN BOLUS VIA INFUSION
3000.0000 [IU] | Freq: Once | INTRAVENOUS | Status: AC
  Administered 2019-02-28: 3000 [IU] via INTRAVENOUS
  Filled 2019-02-28: qty 3000

## 2019-02-28 MED ORDER — SODIUM CHLORIDE 0.9 % IV SOLN: INTRAVENOUS | Status: DC

## 2019-02-28 MED ORDER — LACTATED RINGERS IV SOLN
INTRAVENOUS | Status: DC
  Administered 2019-02-28 – 2019-03-01 (×2): via INTRAVENOUS

## 2019-02-28 MED ORDER — FENTANYL CITRATE (PF) 100 MCG/2ML IJ SOLN
INTRAMUSCULAR | Status: AC
  Administered 2019-02-28: 100 ug via INTRAVENOUS
  Filled 2019-02-28: qty 2

## 2019-02-28 MED ORDER — CHLORHEXIDINE GLUCONATE 0.12% ORAL RINSE (MEDLINE KIT)
15.0000 mL | Freq: Two times a day (BID) | OROMUCOSAL | Status: DC
  Administered 2019-02-28 – 2019-03-01 (×2): 15 mL via OROMUCOSAL

## 2019-02-28 MED ORDER — ONDANSETRON HCL 4 MG/2ML IJ SOLN: 4.0000 mg | Freq: Four times a day (QID) | INTRAMUSCULAR | Status: DC | PRN

## 2019-02-28 MED ORDER — VANCOMYCIN HCL IN DEXTROSE 1-5 GM/200ML-% IV SOLN
1000.0000 mg | Freq: Once | INTRAVENOUS | Status: AC
  Administered 2019-02-28: 1000 mg via INTRAVENOUS
  Filled 2019-02-28: qty 200

## 2019-02-28 MED ORDER — VANCOMYCIN HCL IN DEXTROSE 1-5 GM/200ML-% IV SOLN: 1000.0000 mg | Freq: Once | INTRAVENOUS | Status: DC

## 2019-02-28 NOTE — Consult Note (Signed)
ANTICOAGULATION CONSULT NOTE - Initial Consult  Pharmacy Consult for heparin drip management Indication: atrial fibrillation  Patient Measurements: Weight: 119 lb 0.8 oz (54 kg)  Vital Signs: BP: 129/71 (05/09 1300) Pulse Rate: 105 (05/09 1300)  Labs: Recent Labs    02/28/19 0948  HGB 7.7*  HCT 26.9*  PLT 417*  CREATININE 2.73*  TROPONINI 2.31*    Estimated Creatinine Clearance: 20.9 mL/min (A) (by C-G formula based on SCr of 2.73 mg/dL (H)).   Medical History: Past Medical History:  Diagnosis Date  . Atrial fibrillation ([place])    per patient  . Cancer ([place])   . CHF (congestive heart failure) ([place])   . Chronic back pain   . Chronic leg pain   . Coronary artery disease   . Diabetes mellitus without complication ([place])   . Hypertension   . Neuropathy    Assessment: 50 YOM with a h/o afib on apixaban PTA presents to the ED with AMS. I spoke with his significant other, Rollene Fare, who tells me she is certain he did not take this morning's apixaban dose but is unsure of the administration prior to that. He was admitted to [facility] in January 2020 and received heparin. I am using that information to guide dosing. Initial troponin 2.31, baseline hgb 7.7, PLT 417  Goal of Therapy:  Heparin level 0.3-0.7 units/ml aPTT 66-102s Monitor platelets by anticoagulation protocol: Yes   Plan:  Give 3000 units bolus x 1 Start heparin infusion at 1200 units/hr Check anti-Xa level and aPTT in 8 hours aPTT will be used to guide dosing until aPTT and heparin level correlate  Continue to monitor H&H and platelets  Dallie Piles, PharmD 02/28/2019,1:16 PM

## 2019-02-28 NOTE — ED Notes (Signed)
Intubated at 0907.

## 2019-02-28 NOTE — ED Provider Notes (Signed)
I assisted Dr. Cherylann Banas by providing airway management of this obviously critically ill patient as he directed resuscitative efforts.   INTUBATION Performed by: Delman Kitten  Required items: required blood products, implants, devices, and special equipment available Patient identity confirmed: provided demographic data and hospital-assigned identification number Time out: Immediately prior to procedure a "time out" was called to verify the correct patient, procedure, equipment, support staff and site/side marked as required.  Indications: Decreased responsiveness, unresponsiveness, need for airway protection  Intubation method: Glidescope Laryngoscopy   Preoxygenation: BVM  Sedatives: Etomidate Paralytic: Rocuronium  Tube Size: 7.0 cuffed placed at 25 cm at the teeth  Post-procedure assessment: chest rise and continuous end-tidal CO2 monitor with Square waveform averaging about 15 over multiple and continuous breaths while on the ventilator Breath sounds: equal and absent over the epigastrium Tube secured with: ETT holder  Patient tolerated the procedure well with no immediate complications.   Richard [name] was evaluated in Emergency Department on 02/28/2019 for the symptoms described in the history of present illness. He was evaluated in the context of the global COVID-19 pandemic, which necessitated consideration that the patient might be at risk for infection with the SARS-CoV-2 virus that causes COVID-19. Institutional protocols and algorithms that pertain to the evaluation of patients at risk for COVID-19 are in a state of rapid change based on information released by regulatory bodies including the CDC and federal and state organizations. These policies and algorithms were followed during the patient's care in the ED.   The patient was moved to a negative pressure room for intubation, the coronavirus checklist was followed, airway was secured without difficulty though it was slightly  deeper than anticipated.  No desaturation.   Delman Kitten, MD 02/28/19 [phone]

## 2019-02-28 NOTE — Consult Note (Signed)
Pharmacy Antibiotic Note  Richard [name] is a 63 y.o. male admitted on 02/28/2019 with sepsis.  Pharmacy has been consulted for vancomycin and cefepime dosing. He is being admitted for acute hypoxic respiratory failure most likely due to combination of DKA, cocaine abuse and severe hypothermia. In the ED he received 2 grams of IV cefepime and 1 gram of IV vancomycin. His SCr is elevated well above his baseline level of 0.7-0.8  Plan: 1) Vancomycin 1000 mg IV LD has been administered. Given elevated SCr, random vancomycin level approximately 20 hours after loading dose  Goal AUC 400-550 SCr used: 2.73 (baseline 0.7-0.8 mg/dL)  2) cefepime 1 gram IV every 12 hours  Weight: 119 lb 0.8 oz (54 kg)  No data recorded.  Recent Labs  Lab 02/28/19 0948  WBC 22.4*  CREATININE 2.73*  LATICACIDVEN 1.7    Estimated Creatinine Clearance: 20.9 mL/min (A) (by C-G formula based on SCr of 2.73 mg/dL (H)).    No Known Allergies  Antimicrobials this admission: vancomycin 5/9 >>  cefepime 5/9 >>   Microbiology results: 5/9 BCx: pending 5/9 UCx: pending  5/9 SARS CoV-2: negative  5/9 MRSA PCR: pending  Thank you for allowing pharmacy to be a part of this patient's care.  Dallie Piles, PharmD 02/28/2019 1:32 PM

## 2019-02-28 NOTE — ED Triage Notes (Signed)
Pt presents via EMS emergency traffic for hypotension and unresponiveness. MD at bedside. Decision to intubate made per MD. Equipment at bedside. IV fluids initiated for systolic pressure 44I.

## 2019-02-28 NOTE — Consult Note (Addendum)
PCCM CONSULT NOTE   PT PROFILE/INITIAL PRESENTATION: 63 y.o. male with hx of pancreatic cancer (resected 2018 and followed by [facility] oncology) DM, CAD (Cardiologist: Humphrey Rolls), PVD, severe protein-calorie malnutrition adm via EMS>ED with AMS, hypothermia, DKA agonal respiratory efforts. Intubated immediately upon arrival to ED  MAJOR EVENTS/TEST RESULTS: 05/09 CT head: NAD  INDWELLING DEVICES: ETT 05/09 >>  R IJ CVL 05/09 >>   MICRO DATA: SARS-CoV-2 5/9 >> NEG MRSA PCR 5/9 >>  Urine 5/9 >>  Blood 5/9 >>   ANTIMICROBIALS:  Vanc 05/09 >>  Cefepime 05/09 >>    HPI:  Level 5 caveat patient is comatose, intubated.  History derived from hospital records and ER notes.  Past Medical History:  Diagnosis Date  . Atrial fibrillation ([place])    per patient  . Cancer ([place])   . CHF (congestive heart failure) ([place])   . Chronic back pain   . Chronic leg pain   . Coronary artery disease   . Diabetes mellitus without complication ([place])   . Hypertension   . Neuropathy     Past Surgical History:  Procedure Laterality Date  . CORONARY ANGIOPLASTY WITH STENT PLACEMENT    . ESOPHAGOGASTRODUODENOSCOPY N/A 09/11/2017   Procedure: ESOPHAGOGASTRODUODENOSCOPY (EGD);  Surgeon: Virgel Manifold, MD;  Location: [facility] ENDOSCOPY;  Service: Endoscopy;  Laterality: N/A;  . LOWER EXTREMITY ANGIOGRAPHY Left 11/17/2018   Procedure: Lower Extremity Angiography, with possible intervention;  Surgeon: Algernon Huxley, MD;  Location: [place] CV LAB;  Service: Cardiovascular;  Laterality: Left;  . pancreas removed      MEDICATIONS: I have reviewed all medications and confirmed regimen as documented  Social History   Socioeconomic History  . Marital status: Single    Spouse name: Not on file  . Number of children: Not on file  . Years of education: Not on file  . Highest education level: Not on file  Occupational History  . Not on file  Social Needs  . Financial resource strain: Not on file  . Food  insecurity:    Worry: Not on file    Inability: Not on file  . Transportation needs:    Medical: Not on file    Non-medical: Not on file  Tobacco Use  . Smoking status: Current Every Day Smoker    Packs/day: 0.50  . Smokeless tobacco: Never Used  Substance and Sexual Activity  . Alcohol use: No    Alcohol/week: 0.0 standard drinks  . 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 Cocaine used over a year ago per pt.  . Sexual activity: Not on file  Lifestyle  . Physical activity:    Days per week: Not on file    Minutes per session: Not on file  . Stress: Not on file  Relationships  . Social connections:    Talks on phone: Not on file    Gets together: Not on file    Attends religious service: Not on file    Active member of club or organization: Not on file    Attends meetings of clubs or organizations: Not on file    Relationship status: Not on file  . Intimate partner violence:    Fear of current or ex partner: Not on file    Emotionally abused: Not on file    Physically abused: Not on file    Forced sexual activity: Not on file  Other Topics Concern  . Not on file  Social History Narrative  . Not on file    Family History  Problem Relation Age of Onset  . CAD Mother   . Diabetes Mellitus II Mother   . Diabetes Mellitus II Father     ROS: Level 5 caveat   Vitals:   02/28/19 1245 02/28/19 1300 02/28/19 1406 02/28/19 1443  BP: 115/60 129/71 104/61   Pulse: (!) 49 (!) 105 91 66  Resp: (!) _0 Temp:    (!) 89.1 F (31.7 C)  TempSrc:    Bladder  SpO2:  94% 99% (!) 87%  Weight:    55 kg  Height:    _1  (1.651 m)    Vent Mode: AC FiO2 (%):  [30 %] 30 % Set Rate:  [15 bmp] 15 bmp Vt Set:  [500 mL] 500 mL PEEP:  [5 cmH20] 5 cmH20   No intake/output data recorded.  No intake or output data in the 24 hours ending 02/28/19 1446   EXAM:  Gen: Chronically ill-appearing, cachectic, intubated, minimally  responsive HEENT: Very poor dentition, temporal wasting, NCAT, sclerae white Neck: Supple, no lymphadenopathy Lungs: Clear throughout Cardiovascular: IR IR, rate controlled, no murmur noted Abdomen: Scaphoid, no palpable masses, no diminished bowel sounds Ext: R transmetatarsal amputation, gangrenous (dry) L great toe, no LE edema Neuro: Pupils react to light, minimal spontaneous movement, DTR symmetric Skin: Limited exam, no lesions noted  DATA:   BMP Latest Ref Rng & Units 02/28/2019 11/17/2018 11/13/2018  Glucose 70 - 99 mg/dL 923(HH) 232(H) 196(H)  BUN 8 - 23 mg/dL 72(H) 15 16  Creatinine 0.61 - 1.24 mg/dL 2.73(H) 0.77 0.78  Sodium 135 - 145 mmol/L 141 135 134(L)  Potassium 3.5 - 5.1 mmol/L 4.7 4.5 3.5  Chloride 98 - 111 mmol/L 101 102 104  CO2 22 - 32 mmol/L 8(L) 29 26  Calcium 8.9 - 10.3 mg/dL 7.6(L) 8.5(L) 8.0(L)    CBC Latest Ref Rng & Units 02/28/2019 11/18/2018 11/17/2018  WBC 4.0 - 10.5 K/uL 22.4(H) 10.0 9.0  Hemoglobin 13.0 - 17.0 g/dL 7.7(L) 9.1(L) 9.6(L)  Hematocrit 39.0 - 52.0 % 26.9(L) 28.3(L) 30.0(L)  Platelets 150 - 400 K/uL 417(H) 239 258    Hepatic Function Latest Ref Rng & Units 02/28/2019 11/11/2018 09/20/2018  Total Protein 6.5 - 8.1 g/dL 5.9(L) 7.4 6.9  Albumin 3.5 - 5.0 g/dL 2.3(L) 3.8 3.8  AST 15 - 41 U/L _2 ALT 0 - 44 U/L _3 Alk Phosphatase 38 - 126 U/L 40 31(L) 26(L)  Total Bilirubin 0.3 - 1.2 mg/dL 2.1(H) 0.7 0.9  Bilirubin, Direct 0.0 - 0.2 mg/dL - - -     Cardiac Panel (last 3 results) Recent Labs    02/28/19 0948  TROPONINI 2.31*    BNP (last 3 results) No results for input(s): BNP in the last 8760 hours.  ProBNP (last 3 results) No results for input(s): PROBNP in the last 8760 hours.    CXR: No acute cardiac or pulmonary findings  I have personally reviewed all chest radiographs reported above including CXRs and CT chest unless otherwise indicated  IMPRESSION:   VDRF - intubated due to altered mental status and agonal  respirations Mildly elevated troponin I with h/o CAD Chronic atrial fibrillation Hypotension, likely volume depleted Severe PVD  Gangrenous L great toe AKI Severe metabolic acidosis Hyperosmolarity (calculated serum osm 359!!) Severe protein-calorie malnutrition History of pancreatic cancer, s/p resection 2018  Last CA 19-9 at [facility]: 65 on 11/22/17  S/P splenectomy 2018 Minimally elevated procalcitonin and leukocytosis  No clear evidence of severe sepsis Acute on chronic anemia without overt blood loss Acute encephalopathy -likely due to severe serum hyperosmolality  Urine drug screen + for cocaine Poor chronic health and guarded prognosis  PLAN:  Vent settings established Vent bundle implemented Daily SBT as indicated ICU hemodynamic monitoring Norepinephrine as needed to maintain MAP >65 mmHg Full dose UFH for AF Monitor BMET intermittently Monitor I/Os Correct electrolytes as indicated IVFs adjusted Check CA19-9 05/10  If rising, consider CT abd Monitor temp, WBC count Micro and abx as above  DVT px: Full dose heparin Monitor CBC intermittently Transfuse per usual guidelines PAD protocol - intermittent fentanyl and midazolam Palliative care consultation requested  CCM X 35 mins Merton Border, MD PCCM service Mobile [phone] Pager [phone] 02/28/2019 2:46 PM

## 2019-02-28 NOTE — H&P (Addendum)
[facility] [place] NAME: Richard [name]    MR#:  [id number]  DATE OF BIRTH:  03/30/1956  DATE OF ADMISSION:  02/28/2019  PRIMARY CARE PHYSICIAN: [facility] [place]   REQUESTING/REFERRING PHYSICIAN:   CHIEF COMPLAINT:   Chief Complaint  Patient presents with  . Code Sepsis    HISTORY OF PRESENT ILLNESS: Richard [name]  is a 63 y.o. male with a known history per below which includes A. fib-unknown if patient is compliant with Eliquis, heart failure, CAD, diabetes, hypertension, presents to the emergency room via EMS for acute unresponsiveness, severe hypotension with blood pressure in the 50s, patient intubated immediately for airway protection, ER work-up noted for DKA with pH of 7, glucose 923, creatinine 2.7, white count 22,000, CT head was negative for any acute process, COVID test was negative, urine drug screen noted for cocaine, patient valuated emergency room, no apparent distress, resting comfortably in bed on ventilator, encephalopathic, not following commands, no family available, patient admitted for acute hypoxic respiratory failure most likely due to combination of DKA and cocaine abuse, severe hypothermia.  PAST MEDICAL HISTORY:   Past Medical History:  Diagnosis Date  . Atrial fibrillation ([place])    per patient  . Cancer ([place])   . CHF (congestive heart failure) ([place])   . Chronic back pain   . Chronic leg pain   . Coronary artery disease   . Diabetes mellitus without complication ([place])   . Hypertension   . Neuropathy     PAST SURGICAL HISTORY:  Past Surgical History:  Procedure Laterality Date  . CORONARY ANGIOPLASTY WITH STENT PLACEMENT    . ESOPHAGOGASTRODUODENOSCOPY N/A 09/11/2017   Procedure: ESOPHAGOGASTRODUODENOSCOPY (EGD);  Surgeon: Virgel Manifold, MD;  Location[facility] ENDOSCOPY;  Service: Endoscopy;  Laterality: N/A;  . LOWER EXTREMITY ANGIOGRAPHY Left 11/17/2018   Procedure: Lower Extremity  Angiography, with possible intervention;  Surgeon: Algernon Huxley, MD;  Location: [place] CV LAB;  Service: Cardiovascular;  Laterality: Left;  . pancreas removed      SOCIAL HISTORY:  Social History   Tobacco Use  . Smoking status: Current Every Day Smoker    Packs/day: 0.50  . Smokeless tobacco: Never Used  Substance Use Topics  . Alcohol use: No    Alcohol/week: 0.0 standard drinks    FAMILY HISTORY:  Family History  Problem Relation Age of Onset  . CAD Mother   . Diabetes Mellitus II Mother   . Diabetes Mellitus II Father     DRUG ALLERGIES: No Known Allergies  REVIEW OF SYSTEMS: Unable to be obtained given comatose state  CONSTITUTIONAL: No fever, fatigue or weakness.  EYES: No blurred or double vision.  EARS, NOSE, AND THROAT: No tinnitus or ear pain.  RESPIRATORY: No cough, shortness of breath, wheezing or hemoptysis.  CARDIOVASCULAR: No chest pain, orthopnea, edema.  GASTROINTESTINAL: No nausea, vomiting, diarrhea or abdominal pain.  GENITOURINARY: No dysuria, hematuria.  ENDOCRINE: No polyuria, nocturia,  HEMATOLOGY: No anemia, easy bruising or bleeding SKIN: No rash or lesion. MUSCULOSKELETAL: No joint pain or arthritis.   NEUROLOGIC: No tingling, numbness, weakness.  PSYCHIATRY: No anxiety or depression.   MEDICATIONS AT HOME:  Prior to Admission medications   Medication Sig Start Date End Date Taking? Authorizing Provider  ACCU-CHEK AVIVA PLUS test strip U ONCE TO BID UTD 09/23/18   Nicholes Mango, MD  ACCU-CHEK SOFTCLIX LANCETS lancets TEST 1-2 XD 09/23/18   Nicholes Mango, MD  acetaminophen (TYLENOL) 325  MG tablet Take 1 tablet (325 mg total) by mouth every 6 (six) hours as needed for mild pain (or Fever >/= 101). 01/11/18   Gouru, Aruna, MD  amitriptyline (ELAVIL) 50 MG tablet Take 1 tablet (50 mg total) by mouth at bedtime. 08/04/18   Baran Kuhrt, Avel Peace, MD  apixaban (ELIQUIS) 5 MG TABS tablet Take 1 tablet (5 mg total) by mouth 2 (two) times daily.  09/23/18   Nicholes Mango, MD  aspirin 81 MG chewable tablet Chew 1 tablet (81 mg total) by mouth daily. Patient not taking: Reported on 11/11/2018 01/12/18   Nicholes Mango, MD  atorvastatin (LIPITOR) 40 MG tablet Take 1 tablet (40 mg total) by mouth every evening. 11/18/18   Dustin Flock, MD  Blood Glucose Monitoring Suppl (ACCU-CHEK AVIVA PLUS) w/Device KIT U TO TEST ONCE TO BID 09/23/18   Gouru, Illene Silver, MD  gabapentin (NEURONTIN) 100 MG capsule Take 1 capsule (100 mg total) by mouth 3 (three) times daily. 08/04/18   Rohini Jaroszewski, Avel Peace, MD  HYDROcodone-acetaminophen (NORCO/VICODIN) 5-325 MG tablet Take 1-2 tablets by mouth every 6 (six) hours as needed for moderate pain. 11/18/18   Dustin Flock, MD  insulin aspart (NOVOLOG) 100 UNIT/ML injection Inject 0-5 Units into the skin at bedtime. CBG < 70: implement hypoglycemia protocol CBG 70 - 120: 0 units CBG 121 - 150: 0 units CBG 151 - 200: 0 units CBG 201 - 250: 2 units CBG 251 - 300: 3 units CBG 301 - 350: 4 units CBG 351 - 400: 5 units CBG > 400: call MD 11/18/18   Dustin Flock, MD  LEVEMIR FLEXTOUCH 100 UNIT/ML Pen Inject 17 Units into the skin daily. 09/25/18   [provider]  Multiple Vitamin (MULTIVITAMIN WITH MINERALS) TABS tablet Take 1 tablet by mouth daily. 01/12/18   Nicholes Mango, MD  Needles & Syringes MISC Use as directed 09/23/18   Gouru, Illene Silver, MD  NOVOLOG FLEXPEN 100 UNIT/ML FlexPen Inject 5 Units into the skin 3 (three) times daily before meals. 09/25/18   [provider]  traZODone (DESYREL) 50 MG tablet Take 1 tablet (50 mg total) by mouth at bedtime as needed for sleep. 08/04/18   Reed Eifert, Holly Bodily D, MD      PHYSICAL EXAMINATION:   VITAL SIGNS: Blood pressure (!) 97/58, pulse 81, resp. rate (!) 22, weight 54 kg, SpO2 100 %.  GENERAL:  63 y.o.-year-old patient lying in the bed with no acute distress.  Cachectic/malnourished appearance EYES: Pupils equal, round, reactive to light and accommodation. No scleral  icterus. Extraocular muscles intact.  HEENT: Head atraumatic, normocephalic. Oropharynx and nasopharynx clear.  Poor skin turgor, dry membranes NECK:  Supple, no jugular venous distention. No thyroid enlargement, no tenderness.  LUNGS: Normal breath sounds bilaterally, no wheezing, rales,rhonchi or crepitation. No use of accessory muscles of respiration.  CARDIOVASCULAR: S1, S2 normal. No murmurs, rubs, or gallops.  ABDOMEN: Soft, nontender, nondistended. Bowel sounds present. No organomegaly or mass.  EXTREMITIES: No pedal edema, cyanosis, or clubbing.  Diffuse muscular wasting NEUROLOGIC: Comatose on ventilator,  MAES  PERRLA  PSYCHIATRIC: The patient is comatose  SKIN: Left distal foot dry gangrene    LABORATORY PANEL:   CBC Recent Labs  Lab 02/28/19 0948  WBC 22.4*  HGB 7.7*  HCT 26.9*  PLT 417*  MCV 94.7  MCH 27.1  MCHC 28.6*  RDW 16.2*  LYMPHSABS 1.5  MONOABS 1.7*  EOSABS 0.1  BASOSABS 0.1   ------------------------------------------------------------------------------------------------------------------  Chemistries  Recent Labs  Lab 02/28/19 0948  NA 141  K 4.7  CL 101  CO2 8*  GLUCOSE 923*  BUN 72*  CREATININE 2.73*  CALCIUM 7.6*  AST 23  ALT 14  ALKPHOS 40  BILITOT 2.1*   ------------------------------------------------------------------------------------------------------------------ estimated creatinine clearance is 20.9 mL/min (A) (by C-G formula based on SCr of 2.73 mg/dL (H)). ------------------------------------------------------------------------------------------------------------------ No results for input(s): TSH, T4TOTAL, T3FREE, THYROIDAB in the last 72 hours.  Invalid input(s): FREET3   Coagulation profile No results for input(s): INR, PROTIME in the last 168 hours. ------------------------------------------------------------------------------------------------------------------- No results for input(s): DDIMER in the last 72  hours. -------------------------------------------------------------------------------------------------------------------  Cardiac Enzymes Recent Labs  Lab 02/28/19 0948  TROPONINI 2.31*   ------------------------------------------------------------------------------------------------------------------ Invalid input(s): POCBNP  ---------------------------------------------------------------------------------------------------------------  Urinalysis    Component Value Date/Time   COLORURINE YELLOW (A) 02/28/2019 0948   APPEARANCEUR HAZY (A) 02/28/2019 0948   LABSPEC 1.020 02/28/2019 0948   PHURINE 5.0 02/28/2019 0948   GLUCOSEU >=500 (A) 02/28/2019 0948   HGBUR SMALL (A) 02/28/2019 0948   BILIRUBINUR NEGATIVE 02/28/2019 0948   KETONESUR 80 (A) 02/28/2019 0948   PROTEINUR 30 (A) 02/28/2019 0948   NITRITE NEGATIVE 02/28/2019 0948   LEUKOCYTESUR NEGATIVE 02/28/2019 0948     RADIOLOGY: Ct Head Wo Contrast  Result Date: 02/28/2019 CLINICAL DATA:  63 year old male with altered mental status EXAM: CT HEAD WITHOUT CONTRAST TECHNIQUE: Contiguous axial images were obtained from the base of the skull through the vertex without intravenous contrast. COMPARISON:  11/11/2018, 09/20/2018 FINDINGS: Brain: No acute intracranial hemorrhage. No midline shift or mass effect. Gray-white differentiation maintained. Redemonstration of mild periventricular white matter disease. Unremarkable appearance of the ventricular system. Vascular: Intracranial atherosclerosis Skull: No acute fracture.  No aggressive bone lesion identified. Sinuses/Orbits: Unremarkable appearance of the orbits. Mastoid air cells clear. No middle ear effusion. No significant sinus disease. Other: None IMPRESSION: No acute intracranial abnormality. 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   On: 02/28/2019 12:34   Dg Chest Port 1 View  Result  Date: 02/28/2019 CLINICAL DATA:  Central line placement, endotracheal and orogastric tube placement. EXAM: PORTABLE CHEST 1 VIEW COMPARISON:  Radiograph of November 11, 2018. FINDINGS: The heart size and mediastinal contours are within normal limits. Endotracheal and nasogastric tubes are in grossly good position. Right internal jugular catheter is noted with distal tip in expected position of the SVC. No pneumothorax or pleural effusion is noted. Both lungs are clear. The visualized skeletal structures are unremarkable. IMPRESSION: Endotracheal and nasogastric tubes are in grossly good position. Right internal jugular catheter is in good position. No acute cardiopulmonary abnormality seen. Electronically Signed   By: Marijo Conception M.D.   On: 02/28/2019 10:23    EKG: Orders placed or performed during the hospital encounter of 02/28/19  . ED EKG 12-Lead  . ED EKG 12-Lead    IMPRESSION AND PLAN: *Acute hypoxic respiratory failure Most likely multifactorial in etiology-due to DKA, cocaine abuse, severe hypothermia Admit on our ICU protocol, mechanical ventilation with weaning as tolerated  *Acute DKA with chronic diabetes mellitus type 2 Most likely secondary to medication noncompliance DKA protocol with insulin drip with weaning as tolerated  *Acute kidney injury Most likely secondary to above Avoid nephrotoxic agents, strict I&O monitoring, daily weights, BMP in the morning  *Acute cocaine abuse Supportive care, cessation counseling when sensorium improves  *History of A. Fib Currently not on any AV nodal blocking agents, unknown if patient is compliant with Eliquis Start heparin drip given elevated  fibrin split products, avoid antihypertensives at this time given severe hypotension  *Acute severe hypothermia [place] hug[place] Cannot exclude possible sepsis-continue empiric antibiotics for now with cefepime/vancomycin, follow-up on cultures  *Acute possible sepsis Sepsis protocol, empiric  cefepime/vancomycin for now, follow-up on cultures  *Acute distal left foot dry gangrene Noted history of peripheral vascular disease, status post right transmetatarsal amputation in the past, noted history of A. fib Vascular surgery consulted for expert opinion, heparin drip for now  *History of coronary artery disease Cycle cardiac enzymes, heparin drip per above, aspirin daily, Lipitor  Disposition pending clinical course DVT prophylaxis-on heparin drip GI prophylaxis-Protonix daily  All the records are reviewed and case discussed with ED provider. Management plans discussed with the patient, family and they are in agreement.  CODE STATUS:full Code Status History    Date Active Date Inactive Code Status Order ID Comments User Context   11/11/2018 0758 11/18/2018 1814 DNR [id number]  Loletha Grayer, MD ED   09/20/2018 1635 09/24/2018 1854 Full Code [id number]  Fritzi Mandes, MD ED   08/03/2018 1020 08/04/2018 1813 Full Code [id number]  Choua Chalker, Avel Peace, MD ED   01/09/2018 1025 01/11/2018 1932 Full Code [id number]  Harrie Foreman, MD Inpatient   09/05/2017 1559 09/11/2017 1928 Full Code [id number]  Fritzi Mandes, MD Inpatient   08/24/2017 0238 08/29/2017 2131 Full Code [id number]  Lance Coon, MD Inpatient   08/10/2015 0822 08/11/2015 1947 Full Code [id number]  Harrie Foreman, MD Inpatient    Questions for Most Recent Historical Code Status (Order [id number])    Question Answer Comment   In the event of cardiac or respiratory ARREST Do not call a "code blue"    In the event of cardiac or respiratory ARREST Do not perform Intubation, CPR, defibrillation or ACLS    In the event of cardiac or respiratory ARREST Use medication by any route, position, wound care, and other measures to relive pain and suffering. May use oxygen, suction and manual treatment of airway obstruction as needed for comfort.    Comments nurse may pronounce        TOTAL TIME TAKING CARE OF THIS PATIENT: 40 minutes.     Avel Peace Megan Hayduk M.D on 02/28/2019   Between 7am to 6pm - Pager - [phone]  After 6pm go to [email or web address] - password [email or web address] [facility]  Office  [phone]  CC: Primary care physician; [facility] [place]   Note: This dictation was prepared with Diplomatic Services operational officer dictation along with smaller phrase technology. Any transcriptional errors that result from this process are unintentional.

## 2019-02-28 NOTE — ED Provider Notes (Addendum)
[facility] Emergency Department Provider Note ____________________________________________   First MD Initiated Contact with Patient 02/28/19 [phone]     (approximate)  I have reviewed the triage vital signs and the nursing notes.   HISTORY  Chief Complaint Code Sepsis  Level 5 caveat: History of present illness limited due to altered mental status  HPI Richard [name] is a 63 y.o. male with PMH as noted below including diabetes and atrial fibrillation who presents with altered mental status.  Per EMS, they were called out for diabetic symptoms.  They found the patient to be altered and with poor respiratory effort.  In addition his temperature was around 91.  Glucose was high.  The patient is unable to provide any history.  Past Medical History:  Diagnosis Date  . Atrial fibrillation ([place])    per patient  . Cancer ([place])   . CHF (congestive heart failure) ([place])   . Chronic back pain   . Chronic leg pain   . Coronary artery disease   . Diabetes mellitus without complication ([place])   . Hypertension   . Neuropathy     Patient Active Problem List   Diagnosis Date Noted  . Respiratory failure ([place]) 02/28/2019  . Diabetes with hyperosmolar coma ([place]) 11/11/2018  . A-fib ([place]) 09/20/2018  . Hyperglycemia 08/03/2018  . Protein-calorie malnutrition, severe 01/10/2018  . Sepsis ([place]) 01/09/2018  . DKA, type 2 ([place]) 09/05/2017  . Ileus ([place]) 08/24/2017  . Diabetes ([place]) 08/24/2017  . Atrial flutter ([place]) 08/24/2017  . CAD (coronary artery disease) 08/24/2017  . HTN (hypertension) 08/24/2017  . Atrial fibrillation ([place]) 08/24/2017  . Tobacco use disorder, severe, dependence 07/25/2017  . Wound dehiscence, surgical, initial encounter 06/24/2017  . Acute hematogenous osteomyelitis of right foot ([place]) 06/20/2017  . 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 Esophagitis 06/20/2017  . Cellulitis of right lower extremity 06/10/2017  . Hypertension, poor  control 06/10/2017  . Insulin dependent type 2 diabetes mellitus ([place]) 06/10/2017  . Chest pain 08/10/2015    Past Surgical History:  Procedure Laterality Date  . CORONARY ANGIOPLASTY WITH STENT PLACEMENT    . ESOPHAGOGASTRODUODENOSCOPY N/A 09/11/2017   Procedure: ESOPHAGOGASTRODUODENOSCOPY (EGD);  Surgeon: Virgel Manifold, MD;  Location: [facility] ENDOSCOPY;  Service: Endoscopy;  Laterality: N/A;  . LOWER EXTREMITY ANGIOGRAPHY Left 11/17/2018   Procedure: Lower Extremity Angiography, with possible intervention;  Surgeon: Algernon Huxley, MD;  Location: [place] CV LAB;  Service: Cardiovascular;  Laterality: Left;  . pancreas removed      Prior to Admission medications   Medication Sig Start Date End Date Taking? Authorizing Provider  amitriptyline (ELAVIL) 50 MG tablet Take 1 tablet (50 mg total) by mouth at bedtime. 08/04/18  Yes Salary, Avel Peace, MD  apixaban (ELIQUIS) 5 MG TABS tablet Take 1 tablet (5 mg total) by mouth 2 (two) times daily. 09/23/18  Yes Gouru, Illene Silver, MD  atorvastatin (LIPITOR) 40 MG tablet Take 1 tablet (40 mg total) by mouth every evening. 11/18/18  Yes Dustin Flock, MD  gabapentin (NEURONTIN) 100 MG capsule Take 1 capsule (100 mg total) by mouth 3 (three) times daily. 08/04/18  Yes Salary, Montell D, MD  insulin aspart (NOVOLOG) 100 UNIT/ML injection Inject 0-5 Units into the skin at bedtime. CBG < 70: implement hypoglycemia protocol CBG 70 - 120: 0 units CBG 121 - 150: 0 units CBG 151 - 200: 0 units CBG 201 - 250: 2 units CBG 251 - 300: 3 units CBG 301 - 350: 4  units CBG 351 - 400: 5 units CBG > 400: call MD 11/18/18  Yes Dustin Flock, MD  LEVEMIR FLEXTOUCH 100 UNIT/ML Pen Inject 17 Units into the skin daily. 09/25/18  Yes [provider]  Multiple Vitamin (MULTIVITAMIN WITH MINERALS) TABS tablet Take 1 tablet by mouth daily. 01/12/18  Yes Gouru, Aruna, MD  NOVOLOG FLEXPEN 100 UNIT/ML FlexPen Inject 5 Units into the skin 3 (three) times daily  before meals. 09/25/18  Yes [provider]  traZODone (DESYREL) 50 MG tablet Take 1 tablet (50 mg total) by mouth at bedtime as needed for sleep. 08/04/18  Yes Salary, Avel Peace, MD  ACCU-CHEK AVIVA PLUS test strip U ONCE TO BID UTD 09/23/18   Nicholes Mango, MD  ACCU-CHEK SOFTCLIX LANCETS lancets TEST 1-2 XD 09/23/18   Gouru, Illene Silver, MD  acetaminophen (TYLENOL) 325 MG tablet Take 1 tablet (325 mg total) by mouth every 6 (six) hours as needed for mild pain (or Fever >/= 101). 01/11/18   Gouru, Aruna, MD  aspirin 81 MG chewable tablet Chew 1 tablet (81 mg total) by mouth daily. Patient not taking: Reported on 11/11/2018 01/12/18   Nicholes Mango, MD  Blood Glucose Monitoring Suppl (ACCU-CHEK AVIVA PLUS) w/Device KIT U TO TEST ONCE TO BID 09/23/18   Gouru, Illene Silver, MD  HYDROcodone-acetaminophen (NORCO/VICODIN) 5-325 MG tablet Take 1-2 tablets by mouth every 6 (six) hours as needed for moderate pain. Patient not taking: Reported on 02/28/2019 11/18/18   Dustin Flock, MD  Needles & Syringes MISC Use as directed 09/23/18   Nicholes Mango, MD    Allergies Patient has no known allergies.  Family History  Problem Relation Age of Onset  . CAD Mother   . Diabetes Mellitus II Mother   . Diabetes Mellitus II Father     Social History Social History   Tobacco Use  . Smoking status: Current Every Day Smoker    Packs/day: 0.50  . Smokeless tobacco: Never Used  Substance Use Topics  . Alcohol use: No    Alcohol/week: 0.0 standard drinks  . 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 Cocaine used over a year ago per pt.    Review of Systems Level 5 caveat: Unable to obtain review of systems due to altered mental status    ____________________________________________   PHYSICAL EXAM:  VITAL SIGNS: ED Triage Vitals [02/28/19 0915]  Enc Vitals Group     BP      Pulse      Resp      Temp      Temp src      SpO2 100 %     Weight      Height      Head  Circumference      Peak Flow      Pain Score      Pain Loc      Pain Edu?      Excl. in [place]?     Constitutional: Confused, minimally responsive, Eyes: Conjunctivae are normal.  EOMI.  PERRLA. Head: Atraumatic. Nose: No congestion/rhinnorhea. Mouth/Throat: Mucous membranes are dry.   Neck: Normal range of motion.  Cardiovascular: Normal rate, regular rhythm. Grossly normal heart sounds.  Good peripheral circulation. Respiratory: Poor respiratory effort with bradypnea.  Diminished breath sounds bilaterally. Gastrointestinal: Soft and nontender. No distention.  Genitourinary: Normal external genitalia. Musculoskeletal: Extremities warm and well perfused.  Neurologic: Moving all extremities.  Unable to assess otherwise. Skin:  Skin is  cool and dry. No rash noted. Psychiatric: Unable to assess.  ____________________________________________   LABS (all labs ordered are listed, but only abnormal results are displayed)  Labs Reviewed  COMPREHENSIVE METABOLIC PANEL - Abnormal; Notable for the following components:      Result Value   CO2 8 (*)    Glucose, Bld 923 (*)    BUN 72 (*)    Creatinine, Ser 2.73 (*)    Calcium 7.6 (*)    Total Protein 5.9 (*)    Albumin 2.3 (*)    Total Bilirubin 2.1 (*)    GFR calc non Af Amer 24 (*)    GFR calc Af Amer 27 (*)    Anion gap 32 (*)    All other components within normal limits  CBC WITH DIFFERENTIAL/PLATELET - Abnormal; Notable for the following components:   WBC 22.4 (*)    RBC 2.84 (*)    Hemoglobin 7.7 (*)    HCT 26.9 (*)    MCHC 28.6 (*)    RDW 16.2 (*)    Platelets 417 (*)    Neutro Abs 18.0 (*)    Monocytes Absolute 1.7 (*)    Abs Immature Granulocytes 0.98 (*)    All other components within normal limits  FIBRIN DERIVATIVES D-DIMER ([facility] ONLY) - Abnormal; Notable for the following components:   Fibrin derivatives D-dimer ([facility]) 930.31 (*)    All other components within normal limits  URINALYSIS, COMPLETE (UACMP) WITH  MICROSCOPIC - Abnormal; Notable for the following components:   Color, Urine YELLOW (*)    APPearance HAZY (*)    Glucose, UA >=500 (*)    Hgb urine dipstick SMALL (*)    Ketones, ur 80 (*)    Protein, ur 30 (*)    All other components within normal limits  TROPONIN I - Abnormal; Notable for the following components:   Troponin I 2.31 (*)    All other components within normal limits  BLOOD GAS, ARTERIAL - Abnormal; Notable for the following components:   pH, Arterial 7.02 (*)    pCO2 arterial 31 (*)    pO2, Arterial 169 (*)    Bicarbonate 8.0 (*)    Acid-base deficit 22.0 (*)    All other components within normal limits  URINE DRUG SCREEN, QUALITATIVE ([facility] ONLY) - Abnormal; Notable for the following components:   Cocaine Metabolite,Ur [place] POSITIVE (*)    All other components within normal limits  GLUCOSE, CAPILLARY - Abnormal; Notable for the following components:   Glucose-Capillary >600 (*)    All other components within normal limits  GLUCOSE, CAPILLARY - Abnormal; Notable for the following components:   Glucose-Capillary >600 (*)    All other components within normal limits  GLUCOSE, CAPILLARY - Abnormal; Notable for the following components:   Glucose-Capillary >600 (*)    All other components within normal limits  SARS CORONAVIRUS 2 (HOSPITAL ORDER, [facility])  CULTURE, BLOOD (ROUTINE X 2)  CULTURE, BLOOD (ROUTINE X 2)  URINE CULTURE  LACTIC ACID, PLASMA  PROCALCITONIN  LIPASE, BLOOD  ETHANOL  LACTIC ACID, PLASMA  TROPONIN I  TROPONIN I  TROPONIN I  APTT  HEPARIN LEVEL (UNFRACTIONATED)  PROTIME-INR   ____________________________________________  EKG  ED ECG REPORT I, Arta Silence, the attending physician, personally viewed and interpreted this ECG.  Date: 02/28/2019 EKG Time: 1027 Rate: 78 Rhythm: Normal sinus rhythm QRS Axis: Normal Intervals: Prolonged QTc ST/T Wave abnormalities: Approximately 1 to 2 mm of ST  elevation  inferiorly with 1 mm ST depression in V3 and V4 Narrative Interpretation: Acute ischemia inferior/posterior  ____________________________________________  RADIOLOGY  CT head: CXR: No focal infiltrate.  ET tube, gastric tube, and central line in good position.  ____________________________________________   PROCEDURES  Procedure(s) performed: Yes  .Central Line Date/Time: 02/28/2019 10:40 AM Performed by: Arta Silence, MD Authorized by: Arta Silence, MD   Consent:    Consent obtained:  Emergent situation Pre-procedure details:    Hand hygiene: Hand hygiene performed prior to insertion     Sterile barrier technique: All elements of maximal sterile technique followed     Skin preparation:  2% chlorhexidine   Skin preparation agent: Skin preparation agent completely dried prior to procedure   Anesthesia (see MAR for exact dosages):    Anesthesia method:  None Procedure details:    Location:  R internal jugular   Patient position:  Flat   Procedural supplies:  Triple lumen   Landmarks identified: yes     Ultrasound guidance: yes     Sterile ultrasound techniques: Sterile gel and sterile probe covers were used     Number of attempts:  1   Successful placement: yes   Post-procedure details:    Post-procedure:  Dressing applied and line sutured   Assessment:  Blood return through all ports and free fluid flow   Patient tolerance of procedure:  Tolerated well, no immediate complications    Critical Care performed: Yes  CRITICAL CARE Performed by: Arta Silence   Total critical care time: 60 minutes  Critical care time was exclusive of separately billable procedures and treating other patients.  Critical care was necessary to treat or prevent imminent or life-threatening deterioration.  Critical care was time spent personally by me on the following activities: development of treatment plan with patient and/or surrogate as well as nursing,  discussions with consultants, evaluation of patient's response to treatment, examination of patient, obtaining history from patient or surrogate, ordering and performing treatments and interventions, ordering and review of laboratory studies, ordering and review of radiographic studies, pulse oximetry and re-evaluation of patient's condition. ____________________________________________   INITIAL IMPRESSION / ASSESSMENT AND PLAN / ED COURSE  Pertinent labs & imaging results that were available during my care of the patient were reviewed by me and considered in my medical decision making (see chart for details).  63 year old male with PMH as noted above presents with altered mental status after EMS was called out for diabetic symptoms.  The patient is minimally responsive and unable to provide any history.  I reviewed the past medical records in epic; the patient was admitted in January of this year with [facility] and altered mental status.  On exam, the patient was minimally responsive.  He was moving all extremities spontaneously but confused and unable to speak or answer questions.  He demonstrated very poor respiratory effort with slow respirations, appearing somewhat agonal.  He was also extremely hypertensive with blood pressure initially around 60s over 40s.  Per EMS, fingerstick glucose was high.  There was no visible trauma.  The patient does have a dry gangrenous appearing left great toe with no drainage or evidence of surrounding infection.  Differential includes HHNC, sepsis, dehydration/hypovolemia, other metabolic etiology, or less likely primary CNS cause.   Because of the patient's very poor respiratory effort, concern over not being able to protect his airway, hypotension, and overall tenuous clinical status, we proceeded with intubation.  Dr. Jacqualine Code perform the intubation and has documented it in a separate note.  The patient's blood pressure was improving with fluids, but not long after  the intubation his blood pressure went back to the 50s, he became bradycardic, and there was concern that he may imminently lose his pulse.  I set up to place a crash central line in his groin, however within a few minutes his vital signs started to improve with more fluid.  I placed an IJ central line under ultrasound coverage but under not under full sterile conditions due to the emergent nature of the procedure.  ----------------------------------------- 11:54 AM on 02/28/2019 -----------------------------------------  Work-up reveals glucose in the 900s and significant metabolic acidosis.  The patient is receiving fluids and insulin infusion.  He has had some mild persistent hypotension but has responded well to a low-dose of Levophed.  Chest x-ray and UA are negative.  His COVID swab is also negative.  EKG shows findings concerning for inferior and posterior ischemia although he does not meet STEMI criteria.  This and the elevated troponin are consistent with myocardial strain related to the underlying diabetic coma.  There is no indication for heparin drip or other acute cardiac intervention at this time.  I signed the patient out to the hospitalist for admission.   Richard [name] was evaluated in Emergency Department on 02/28/2019 for the symptoms described in the history of present illness. He was evaluated in the context of the global COVID-19 pandemic, which necessitated consideration that the patient might be at risk for infection with the SARS-CoV-2 virus that causes COVID-19. Institutional protocols and algorithms that pertain to the evaluation of patients at risk for COVID-19 are in a state of rapid change based on information released by regulatory bodies including the CDC and federal and state organizations. These policies and algorithms were followed during the patient's care in the ED.  ____________________________________________   FINAL CLINICAL IMPRESSION(S) / ED DIAGNOSES  Final  diagnoses:  Diabetic ketoacidosis with coma associated with type 2 diabetes mellitus ([place])  Acute respiratory failure, unspecified whether with hypoxia or hypercapnia ([place])      NEW MEDICATIONS STARTED DURING THIS VISIT:  New Prescriptions   No medications on file     Note:  This document was prepared using Dragon voice recognition software and may include unintentional dictation errors.    Arta Silence, MD 02/28/19 1156    Arta Silence, MD 02/28/19 1429

## 2019-02-28 NOTE — ED Notes (Addendum)
ED TO INPATIENT HANDOFF REPORT  ED Nurse Name and Phone #: Karena Addison 16  S Name/Age/Gender Richard [name] 63 y.o. male Room/Bed: ED01A/ED01A  Code Status   Code Status: Prior  Home/SNF/Other Home  Is this baseline? No   Triage Complete: Triage complete  Chief Complaint code sepsis  Triage Note Pt presents via EMS emergency traffic for hypotension and unresponiveness. MD at bedside. Decision to intubate made per MD. Equipment at bedside. IV fluids initiated for systolic pressure 94W.    Allergies No Known Allergies  Level of Care/Admitting Diagnosis ED Disposition    ED Disposition Condition [facility] Area: [place] [[id number]]  Level of Care: ICU [6]  Covid Evaluation: N/A  Diagnosis: Respiratory failure [facility]) [[id number]]  Admitting Physician: Gorden Harms [[id number]]  Attending Physician: Gorden Harms [[id number]]  Estimated length of stay: past midnight tomorrow  Certification:: I certify this patient will need inpatient services for at least 2 midnights  PT Class (Do Not Modify): Inpatient [101]  PT Acc Code (Do Not Modify): Private [1]       B Medical/Surgery History Past Medical History:  Diagnosis Date  . Atrial fibrillation ([place])    per patient  . Cancer ([place])   . CHF (congestive heart failure) ([place])   . Chronic back pain   . Chronic leg pain   . Coronary artery disease   . Diabetes mellitus without complication ([place])   . Hypertension   . Neuropathy    Past Surgical History:  Procedure Laterality Date  . CORONARY ANGIOPLASTY WITH STENT PLACEMENT    . ESOPHAGOGASTRODUODENOSCOPY N/A 09/11/2017   Procedure: ESOPHAGOGASTRODUODENOSCOPY (EGD);  Surgeon: Virgel Manifold, MD;  Location[facility] ENDOSCOPY;  Service: Endoscopy;  Laterality: N/A;  . LOWER EXTREMITY ANGIOGRAPHY Left 11/17/2018   Procedure: Lower Extremity Angiography, with possible intervention;  Surgeon: Algernon Huxley, MD;  Location: [place] CV LAB;   Service: Cardiovascular;  Laterality: Left;  . pancreas removed       A IV Location/Drains/Wounds Patient Lines/Drains/Airways Status   Active Line/Drains/Airways    Name:   Placement date:   Placement time:   Site:   Days:   Peripheral IV 02/28/19 Left Antecubital   02/28/19    1037    Antecubital   less than 1   Peripheral IV 02/28/19 Left Forearm   02/28/19    1037    Forearm   less than 1   CVC Triple Lumen 02/28/19 Right Internal jugular   02/28/19    0952     less than 1   Sheath 11/17/18 Right Arterial;Femoral   11/17/18    1451    Arterial;Femoral   103   NG/OG Tube Nasogastric 16 Fr. Right nare Xray;Aucultation   02/28/19    1039    Right nare   less than 1   Urethral Catheter Hunter RN Latex 16 Fr.   02/28/19    1008    Latex   less than 1   Airway 7 mm   02/28/19    0905     less than 1   Wound / Incision (Open or Dehisced) 08/03/18 Other (Comment) Knee Right   08/03/18    1143    Knee   209          Intake/Output Last 24 hours No intake or output data in the 24 hours ending 02/28/19 1310  Labs/Imaging Results for orders placed or performed during the hospital encounter of 02/28/19 (  from the past 48 hour(s))  Urine Drug Screen, Qualitative ([place] only)     Status: Abnormal   Collection Time: 02/28/19  9:40 AM  Result Value Ref Range   Tricyclic, Ur Screen NONE DETECTED NONE DETECTED   Amphetamines, Ur Screen NONE DETECTED NONE DETECTED   MDMA (Ecstasy)Ur Screen NONE DETECTED NONE DETECTED   Cocaine Metabolite,Ur [place] POSITIVE (A) NONE DETECTED   Opiate, Ur Screen NONE DETECTED NONE DETECTED   Phencyclidine (PCP) Ur S NONE DETECTED NONE DETECTED   Cannabinoid 50 Ng, Ur [place] NONE DETECTED NONE DETECTED   Barbiturates, Ur Screen NONE DETECTED NONE DETECTED   Benzodiazepine, Ur Scrn NONE DETECTED NONE DETECTED   Methadone Scn, Ur NONE DETECTED NONE DETECTED    Comment: (NOTE) Tricyclics + metabolites, urine    Cutoff 1000 ng/mL Amphetamines + metabolites, urine  Cutoff  1000 ng/mL MDMA (Ecstasy), urine              Cutoff 500 ng/mL Cocaine Metabolite, urine          Cutoff 300 ng/mL Opiate + metabolites, urine        Cutoff 300 ng/mL Phencyclidine (PCP), urine         Cutoff 25 ng/mL Cannabinoid, urine                 Cutoff 50 ng/mL Barbiturates + metabolites, urine  Cutoff 200 ng/mL Benzodiazepine, urine              Cutoff 200 ng/mL Methadone, urine                   Cutoff 300 ng/mL The urine drug screen provides only a preliminary, unconfirmed analytical test result and should not be used for non-medical purposes. Clinical consideration and professional judgment should be applied to any positive drug screen result due to possible interfering substances. A more specific alternate chemical method must be used in order to obtain a confirmed analytical result. Gas chromatography / mass spectrometry (GC/MS) is the preferred confirmat ory method. Performed at [facility], [address] [place], [place] [address]   Lactic acid, plasma     Status: None   Collection Time: 02/28/19  9:48 AM  Result Value Ref Range   Lactic Acid, Venous 1.7 0.5 - 1.9 mmol/L    Comment: Performed at [facility], [address] [place], [place] [address]  Comprehensive metabolic panel     Status: Abnormal   Collection Time: 02/28/19  9:48 AM  Result Value Ref Range   Sodium 141 135 - 145 mmol/L    Comment: ELCTROLYTES REPEATED.PMF   Potassium 4.7 3.5 - 5.1 mmol/L   Chloride 101 98 - 111 mmol/L   CO2 8 (L) 22 - 32 mmol/L   Glucose, Bld 923 (HH) 70 - 99 mg/dL    Comment: CRITICAL RESULT CALLED TO, READ BACK BY AND VERIFIED WITH ASHLEY ORSUTO AT 1106 02/28/2019.PMF   BUN 72 (H) 8 - 23 mg/dL   Creatinine, Ser 2.73 (H) 0.61 - 1.24 mg/dL   Calcium 7.6 (L) 8.9 - 10.3 mg/dL   Total Protein 5.9 (L) 6.5 - 8.1 g/dL   Albumin 2.3 (L) 3.5 - 5.0 g/dL   AST 23 15 - 41 U/L   ALT 14 0 - 44 U/L   Alkaline Phosphatase 40 38 - 126 U/L   Total Bilirubin 2.1 (H)  0.3 - 1.2 mg/dL   GFR calc non Af Amer 24 (L) >60 mL/min   GFR calc Af  Amer 27 (L) >60 mL/min   Anion gap 32 (H) 5 - 15    Comment: Performed at [facility], [address] [place], [place] [address]  CBC WITH DIFFERENTIAL     Status: Abnormal   Collection Time: 02/28/19  9:48 AM  Result Value Ref Range   WBC 22.4 (H) 4.0 - 10.5 K/uL   RBC 2.84 (L) 4.22 - 5.81 MIL/uL   Hemoglobin 7.7 (L) 13.0 - 17.0 g/dL   HCT 26.9 (L) 39.0 - 52.0 %   MCV 94.7 80.0 - 100.0 fL   MCH 27.1 26.0 - 34.0 pg   MCHC 28.6 (L) 30.0 - 36.0 g/dL   RDW 16.2 (H) 11.5 - 15.5 %   Platelets 417 (H) 150 - 400 K/uL   nRBC 0.0 0.0 - 0.2 %   Neutrophils Relative % 81 %   Neutro Abs 18.0 (H) 1.7 - 7.7 K/uL   Lymphocytes Relative 7 %   Lymphs Abs 1.5 0.7 - 4.0 K/uL   Monocytes Relative 8 %   Monocytes Absolute 1.7 (H) 0.1 - 1.0 K/uL   Eosinophils Relative 0 %   Eosinophils Absolute 0.1 0.0 - 0.5 K/uL   Basophils Relative 0 %   Basophils Absolute 0.1 0.0 - 0.1 K/uL   Immature Granulocytes 4 %   Abs Immature Granulocytes 0.98 (H) 0.00 - 0.07 K/uL    Comment: Performed at [facility], [place], [place] [address]  Procalcitonin     Status: None   Collection Time: 02/28/19  9:48 AM  Result Value Ref Range   Procalcitonin 0.23 ng/mL    Comment:        Interpretation: PCT (Procalcitonin) <= 0.5 ng/mL: Systemic infection (sepsis) is not likely. Local bacterial infection is possible. (NOTE)       Sepsis PCT Algorithm           Lower Respiratory Tract                                      Infection PCT Algorithm    ----------------------------     ----------------------------         PCT < 0.25 ng/mL                PCT < 0.10 ng/mL         Strongly encourage             Strongly discourage   discontinuation of antibiotics    initiation of antibiotics    ----------------------------     -----------------------------       PCT 0.25 - 0.50 ng/mL            PCT 0.10 - 0.25 ng/mL                OR       >80% decrease in PCT            Discourage initiation of                                            antibiotics      Encourage discontinuation           of antibiotics    ----------------------------     -----------------------------         PCT >=  0.50 ng/mL              PCT 0.26 - 0.50 ng/mL               AND        <80% decrease in PCT             Encourage initiation of                                             antibiotics       Encourage continuation           of antibiotics    ----------------------------     -----------------------------        PCT >= 0.50 ng/mL                  PCT > 0.50 ng/mL               AND         increase in PCT                  Strongly encourage                                      initiation of antibiotics    Strongly encourage escalation           of antibiotics                                     -----------------------------                                           PCT <= 0.25 ng/mL                                                 OR                                        > 80% decrease in PCT                                     Discontinue / Do not initiate                                             antibiotics Performed at [facility], [address] [place], [place] [address]   Fibrin derivatives D-Dimer [facility] only)     Status: Abnormal   Collection Time: 02/28/19  9:48 AM  Result Value Ref Range   Fibrin derivatives D-dimer ([facility]) 930.31 (H) 0.00 - 499.00 ng/mL (FEU)    Comment: (NOTE) <> Exclusion of Venous Thromboembolism (VTE) - OUTPATIENT ONLY   (Emergency  Department or [place])   0-499 ng/ml (FEU): With a low to intermediate pretest probability                      for VTE this test result excludes the diagnosis                      of VTE.   >499 ng/ml (FEU) : VTE not excluded; additional work up for VTE is                      required. <> Testing on Inpatients and Evaluation of Disseminated  Intravascular   Coagulation (DIC) Reference Range:   0-499 ng/ml (FEU) Performed at [facility] [address] [place], [place] [address]   Urinalysis, Complete w Microscopic     Status: Abnormal   Collection Time: 02/28/19  9:48 AM  Result Value Ref Range   Color, Urine YELLOW (A) YELLOW   APPearance HAZY (A) CLEAR   Specific Gravity, Urine 1.020 1.005 - 1.030   pH 5.0 5.0 - 8.0   Glucose, UA >=500 (A) NEGATIVE mg/dL   Hgb urine dipstick SMALL (A) NEGATIVE   Bilirubin Urine NEGATIVE NEGATIVE   Ketones, ur 80 (A) NEGATIVE mg/dL   Protein, ur 30 (A) NEGATIVE mg/dL   Nitrite NEGATIVE NEGATIVE   Leukocytes,Ua NEGATIVE NEGATIVE   RBC / HPF 0-5 0 - 5 RBC/hpf   WBC, UA 0-5 0 - 5 WBC/hpf   Bacteria, UA NONE SEEN NONE SEEN   Squamous Epithelial / LPF 0-5 0 - 5   Mucus PRESENT    Hyaline Casts, UA PRESENT     Comment: Performed at [facility], [address] [place], [place] [address]  SARS Coronavirus 2 (CEPHEID- Performed in [place] hospital lab), Hosp Order     Status: None   Collection Time: 02/28/19  9:48 AM  Result Value Ref Range   SARS Coronavirus 2 NEGATIVE NEGATIVE    Comment: (NOTE) If result is NEGATIVE SARS-CoV-2 target nucleic acids are NOT DETECTED. The SARS-CoV-2 RNA is generally detectable in upper and lower  respiratory specimens during the acute phase of infection. The lowest  concentration of SARS-CoV-2 viral copies this assay can detect is 250  copies / mL. A negative result does not preclude SARS-CoV-2 infection  and should not be used as the sole basis for treatment or other  patient management decisions.  A negative result may occur with  improper specimen collection / handling, submission of specimen other  than nasopharyngeal swab, presence of viral mutation(s) within the  areas targeted by this assay, and inadequate number of viral copies  (<250 copies / mL). A negative result must be combined with clinical  observations,  patient history, and epidemiological information. If result is POSITIVE SARS-CoV-2 target nucleic acids are DETECTED. The SARS-CoV-2 RNA is generally detectable in upper and lower  respiratory specimens dur ing the acute phase of infection.  Positive  results are indicative of active infection with SARS-CoV-2.  Clinical  correlation with patient history and other diagnostic information is  necessary to determine patient infection status.  Positive results do  not rule out bacterial infection or co-infection with other viruses. If result is PRESUMPTIVE POSTIVE SARS-CoV-2 nucleic acids MAY BE PRESENT.   A presumptive positive result was obtained on the submitted specimen  and confirmed on repeat testing.  While 2019 novel coronavirus  (SARS-CoV-2) nucleic acids may be present in the  submitted sample  additional confirmatory testing may be necessary for epidemiological  and / or clinical management purposes  to differentiate between  SARS-CoV-2 and other Sarbecovirus currently known to infect humans.  If clinically indicated additional testing with an alternate test  methodology [phone]) is advised. The SARS-CoV-2 RNA is generally  detectable in upper and lower respiratory sp ecimens during the acute  phase of infection. The expected result is Negative. Fact Sheet for Patients:  StrictlyIdeas.no Fact Sheet for Healthcare Providers: BankingDealers.co.za This test is not yet approved or cleared by the [place] FDA and has been authorized for detection and/or diagnosis of SARS-CoV-2 by FDA under an Emergency Use Authorization (EUA).  This EUA will remain in effect (meaning this test can be used) for the duration of the COVID-19 declaration under Section 564(b)(1) of the Act, 21 U.S.C. section 360bbb-3(b)(1), unless the authorization is terminated or revoked sooner. Performed at [facility], [address]  [place], [place] [address]   Troponin I - Once     Status: Abnormal   Collection Time: 02/28/19  9:48 AM  Result Value Ref Range   Troponin I 2.31 (HH) <0.03 ng/mL    Comment: CRITICAL RESULT CALLED TO, READ BACK BY AND VERIFIED WITH ASHLEY ORSUTO AT 1106 02/28/2019.PMF Performed at [facility], [address] [place], [place] [address]   Lipase, blood     Status: None   Collection Time: 02/28/19  9:48 AM  Result Value Ref Range   Lipase 27 11 - 51 U/L    Comment: Performed at [facility], [address] [place], [place] [address]  Ethanol     Status: None   Collection Time: 02/28/19  9:48 AM  Result Value Ref Range   Alcohol, Ethyl (B) <10 <10 mg/dL    Comment: (NOTE) Lowest detectable limit for serum alcohol is 10 mg/dL. For medical purposes only. Performed at [facility], [address] [place], [place] [address]   Blood gas, arterial     Status: Abnormal   Collection Time: 02/28/19 10:05 AM  Result Value Ref Range   FIO2 0.30    Delivery systems VENTILATOR    Mode ASSIST CONTROL    VT 500 mL   LHR 15 resp/min   Peep/cpap 5.0 cm H20   pH, Arterial 7.02 (LL) 7.350 - 7.450    Comment: CRITICAL RESULT CALLED TO, READ BACK BY AND VERIFIED WITH: BRANDY RN AT 1020 ON 02/28/19 BL    pCO2 arterial 31 (L) 32.0 - 48.0 mmHg   pO2, Arterial 169 (H) 83.0 - 108.0 mmHg   Bicarbonate 8.0 (L) 20.0 - 28.0 mmol/L   Acid-base deficit 22.0 (H) 0.0 - 2.0 mmol/L   O2 Saturation 98.7 %   Patient temperature 37.0    Collection site LEFT RADIAL    Sample type ARTERIAL DRAW    Allens test (pass/fail) PASS PASS    Comment: Performed at [facility], [place], [place] [address]  Glucose, capillary     Status: Abnormal   Collection Time: 02/28/19 11:10 AM  Result Value Ref Range   Glucose-Capillary >600 (HH) 70 - 99 mg/dL  Glucose, capillary     Status: Abnormal   Collection Time: 02/28/19 12:26 PM  Result Value Ref Range    Glucose-Capillary >600 (HH) 70 - 99 mg/dL   Ct Head Wo Contrast  Result Date: 02/28/2019 CLINICAL DATA:  63 year old male with altered mental status EXAM: CT HEAD WITHOUT CONTRAST TECHNIQUE: Contiguous axial  images were obtained from the base of the skull through the vertex without intravenous contrast. COMPARISON:  11/11/2018, 09/20/2018 FINDINGS: Brain: No acute intracranial hemorrhage. No midline shift or mass effect. Gray-white differentiation maintained. Redemonstration of mild periventricular white matter disease. Unremarkable appearance of the ventricular system. Vascular: Intracranial atherosclerosis Skull: No acute fracture.  No aggressive bone lesion identified. Sinuses/Orbits: Unremarkable appearance of the orbits. Mastoid air cells clear. No middle ear effusion. No significant sinus disease. Other: None IMPRESSION: No acute intracranial abnormality. 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   On: 02/28/2019 12:34   Dg Chest Port 1 View  Result Date: 02/28/2019 CLINICAL DATA:  Central line placement, endotracheal and orogastric tube placement. EXAM: PORTABLE CHEST 1 VIEW COMPARISON:  Radiograph of November 11, 2018. FINDINGS: The heart size and mediastinal contours are within normal limits. Endotracheal and nasogastric tubes are in grossly good position. Right internal jugular catheter is noted with distal tip in expected position of the SVC. No pneumothorax or pleural effusion is noted. Both lungs are clear. The visualized skeletal structures are unremarkable. IMPRESSION: Endotracheal and nasogastric tubes are in grossly good position. Right internal jugular catheter is in good position. No acute cardiopulmonary abnormality seen. Electronically Signed   By: Marijo Conception M.D.   On: 02/28/2019 10:23    Pending Labs Unresulted Labs (From admission, onward)    Start     Ordered   02/28/19 1307  Troponin I -  Now Then Q6H  Now then every 6 hours,   STAT     02/28/19 1306   02/28/19 0857  Blood Culture (routine x 2)  BLOOD CULTURE X 2,   STAT     02/28/19 0856   02/28/19 0857  Urine culture  ONCE - STAT,   STAT     02/28/19 0856   02/28/19 0856  Lactic acid, plasma  Now then every 2 hours,   STAT     02/28/19 0856   Signed and Held  Hemoglobin A1c  Once,   R     Signed and Held          Vitals/Pain Today's Vitals   02/28/19 1130 02/28/19 1135 02/28/19 1140 02/28/19 1145  BP: (!) 92/58   (!) 97/58  Pulse: 81 79 79 81  Resp: (!) 25 (!) 27 (!) 23 (!) 22  SpO2: 100% 100% 100% 100%  Weight:        Isolation Precautions Droplet and Contact precautions  Medications Medications  norepinephrine (LEVOPHED) 4mg  in 291mL premix infusion (6 mcg/min Intravenous Rate/Dose Change 02/28/19 1229)  insulin regular, human (MYXREDLIN) 100 units/ 100 mL infusion (10.8 Units/hr Intravenous Rate/Dose Change 02/28/19 1227)  dextrose 50 % solution 25 mL (has no administration in time range)  0.9 %  sodium chloride infusion (has no administration in time range)  etomidate (AMIDATE) injection (20 mg Intravenous Given 02/28/19 0905)  rocuronium (ZEMURON) injection (70 mg Intravenous Given 02/28/19 0905)  midazolam (VERSED) 50 mg/50 mL (1 mg/mL) premix infusion (0.5 mg/hr Intravenous New Bag/Given 02/28/19 1301)  ceFEPIme (MAXIPIME) 2 g in sodium chloride 0.9 % 100 mL IVPB (0 g Intravenous Stopped 02/28/19 1100)  vancomycin (VANCOCIN) IVPB 1000 mg/200 mL premix (0 mg Intravenous Stopped 02/28/19 1143)  sodium chloride 0.9 % bolus 1,000 mL (0 mLs Intravenous Stopped 02/28/19 1026)  sodium chloride 0.9 % bolus 2,000 mL (0 mLs Intravenous Stopped 02/28/19 1046)  sodium bicarbonate injection 50 mEq (50 mEq Intravenous Given  02/28/19 1113)    Mobility Intubated     Focused Assessments    R Recommendations: See Admitting Provider Note  Report given to: Jonelle Sidle, RN

## 2019-03-01 DIAGNOSIS — E1011 Type 1 diabetes mellitus with ketoacidosis with coma: Secondary | ICD-10-CM

## 2019-03-01 DIAGNOSIS — G92 Toxic encephalopathy: Secondary | ICD-10-CM

## 2019-03-01 DIAGNOSIS — E870 Hyperosmolality and hypernatremia: Secondary | ICD-10-CM

## 2019-03-01 LAB — BASIC METABOLIC PANEL
Anion gap: 17 — ABNORMAL HIGH (ref 5–15)
Anion gap: 10 (ref 5–15)
Anion gap: 13 (ref 5–15)
BUN: 86 mg/dL — ABNORMAL HIGH (ref 8–23)
BUN: 86 mg/dL — ABNORMAL HIGH (ref 8–23)
BUN: 86 mg/dL — ABNORMAL HIGH (ref 8–23)
CO2: 27 mmol/L (ref 22–32)
CO2: 30 mmol/L (ref 22–32)
CO2: 25 mmol/L (ref 22–32)
Calcium: 7.5 mg/dL — ABNORMAL LOW (ref 8.9–10.3)
Calcium: 7.7 mg/dL — ABNORMAL LOW (ref 8.9–10.3)
Calcium: 7.6 mg/dL — ABNORMAL LOW (ref 8.9–10.3)
Chloride: 110 mmol/L (ref 98–111)
Chloride: 114 mmol/L — ABNORMAL HIGH (ref 98–111)
Chloride: 115 mmol/L — ABNORMAL HIGH (ref 98–111)
Creatinine, Ser: 2.33 mg/dL — ABNORMAL HIGH (ref 0.61–1.24)
Creatinine, Ser: 1.88 mg/dL — ABNORMAL HIGH (ref 0.61–1.24)
Creatinine, Ser: 1.8 mg/dL — ABNORMAL HIGH (ref 0.61–1.24)
GFR calc Af Amer: 33 mL/min — ABNORMAL LOW (ref >60)
GFR calc Af Amer: 43 mL/min — ABNORMAL LOW (ref >60)
GFR calc Af Amer: 45 mL/min — ABNORMAL LOW (ref >60)
GFR calc non Af Amer: 28 mL/min — ABNORMAL LOW (ref >60)
GFR calc non Af Amer: 37 mL/min — ABNORMAL LOW (ref >60)
GFR calc non Af Amer: 39 mL/min — ABNORMAL LOW (ref >60)
Glucose, Bld: 224 mg/dL — ABNORMAL HIGH (ref 70–99)
Glucose, Bld: 111 mg/dL — ABNORMAL HIGH (ref 70–99)
Glucose, Bld: 180 mg/dL — ABNORMAL HIGH (ref 70–99)
Potassium: 2.6 mmol/L — CL (ref 3.5–5.1)
Potassium: 3.2 mmol/L — ABNORMAL LOW (ref 3.5–5.1)
Potassium: 3.2 mmol/L — ABNORMAL LOW (ref 3.5–5.1)
Sodium: 154 mmol/L — ABNORMAL HIGH (ref 135–145)
Sodium: 154 mmol/L — ABNORMAL HIGH (ref 135–145)
Sodium: 153 mmol/L — ABNORMAL HIGH (ref 135–145)

## 2019-03-01 LAB — CBC
HCT: 22.2 % — ABNORMAL LOW (ref 39.0–52.0)
Hemoglobin: 7.2 g/dL — ABNORMAL LOW (ref 13.0–17.0)
MCH: 26.5 pg (ref 26.0–34.0)
MCHC: 32.4 g/dL (ref 30.0–36.0)
MCV: 81.6 fL (ref 80.0–100.0)
Platelets: 331 10*3/uL (ref 150–400)
RBC: 2.72 MIL/uL — ABNORMAL LOW (ref 4.22–5.81)
RDW: 15.1 % (ref 11.5–15.5)
WBC: 16.9 10*3/uL — ABNORMAL HIGH (ref 4.0–10.5)
nRBC: 0 % (ref 0.0–0.2)

## 2019-03-01 LAB — GLUCOSE, CAPILLARY
Glucose-Capillary: 119 mg/dL — ABNORMAL HIGH (ref 70–99)
Glucose-Capillary: 152 mg/dL — ABNORMAL HIGH (ref 70–99)
Glucose-Capillary: 166 mg/dL — ABNORMAL HIGH (ref 70–99)
Glucose-Capillary: 173 mg/dL — ABNORMAL HIGH (ref 70–99)
Glucose-Capillary: 199 mg/dL — ABNORMAL HIGH (ref 70–99)
Glucose-Capillary: 204 mg/dL — ABNORMAL HIGH (ref 70–99)
Glucose-Capillary: 209 mg/dL — ABNORMAL HIGH (ref 70–99)
Glucose-Capillary: 260 mg/dL — ABNORMAL HIGH (ref 70–99)
Glucose-Capillary: 264 mg/dL — ABNORMAL HIGH (ref 70–99)

## 2019-03-01 LAB — TROPONIN I
Troponin I: 10.66 ng/mL (ref <0.03)
Troponin I: 14.28 ng/mL (ref <0.03)

## 2019-03-01 LAB — URINE CULTURE: Culture: NO GROWTH

## 2019-03-01 LAB — HEPARIN LEVEL (UNFRACTIONATED): Heparin Unfractionated: 0.43 IU/mL (ref 0.30–0.70)

## 2019-03-01 LAB — APTT: aPTT: 98 seconds — ABNORMAL HIGH (ref 24–36)

## 2019-03-01 LAB — PHOSPHORUS: Phosphorus: 1.7 mg/dL — ABNORMAL LOW (ref 2.5–4.6)

## 2019-03-01 LAB — VANCOMYCIN, RANDOM: Vancomycin Rm: 9

## 2019-03-01 LAB — MAGNESIUM: Magnesium: 2.1 mg/dL (ref 1.7–2.4)

## 2019-03-01 MED ORDER — VANCOMYCIN HCL IN DEXTROSE 1-5 GM/200ML-% IV SOLN
1000.0000 mg | INTRAVENOUS | Status: DC
  Filled 2019-03-01: qty 200

## 2019-03-01 MED ORDER — VITAL AF 1.2 CAL PO LIQD: 1000.0000 mL | ORAL | Status: DC

## 2019-03-01 MED ORDER — VITAL HIGH PROTEIN PO LIQD: 1000.0000 mL | ORAL | Status: DC

## 2019-03-01 MED ORDER — INSULIN ASPART 100 UNIT/ML ~~LOC~~ SOLN
0.0000 [IU] | SUBCUTANEOUS | Status: DC
  Administered 2019-03-01: 5 [IU] via SUBCUTANEOUS
  Administered 2019-03-01: 2 [IU] via SUBCUTANEOUS
  Administered 2019-03-01: 3 [IU] via SUBCUTANEOUS
  Administered 2019-03-01: 2 [IU] via SUBCUTANEOUS
  Administered 2019-03-02: 5 [IU] via SUBCUTANEOUS
  Administered 2019-03-02 (×2): 3 [IU] via SUBCUTANEOUS
  Administered 2019-03-02: 2 [IU] via SUBCUTANEOUS
  Administered 2019-03-02: 3 [IU] via SUBCUTANEOUS
  Administered 2019-03-02: 1 [IU] via SUBCUTANEOUS
  Administered 2019-03-03 (×2): 3 [IU] via SUBCUTANEOUS
  Administered 2019-03-03: 2 [IU] via SUBCUTANEOUS
  Filled 2019-03-01 (×12): qty 1

## 2019-03-01 MED ORDER — PRO-STAT SUGAR FREE PO LIQD: 30.0000 mL | Freq: Two times a day (BID) | ORAL | Status: DC

## 2019-03-01 MED ORDER — INSULIN ASPART 100 UNIT/ML ~~LOC~~ SOLN
0.0000 [IU] | SUBCUTANEOUS | Status: DC
  Administered 2019-03-01: 7 [IU] via SUBCUTANEOUS
  Filled 2019-03-01 (×2): qty 1

## 2019-03-01 MED ORDER — HYDROCODONE-ACETAMINOPHEN 7.5-325 MG/15ML PO SOLN
10.0000 mL | Freq: Four times a day (QID) | ORAL | Status: DC | PRN
  Administered 2019-03-02: 10 mL
  Filled 2019-03-01: qty 15

## 2019-03-01 MED ORDER — POTASSIUM CHLORIDE 2 MEQ/ML IV SOLN
INTRAVENOUS | Status: DC
  Administered 2019-03-01 (×2): via INTRAVENOUS
  Filled 2019-03-01 (×2): qty 1000

## 2019-03-01 MED ORDER — SODIUM CHLORIDE 0.9 % IV SOLN
2.0000 g | Freq: Two times a day (BID) | INTRAVENOUS | Status: DC
  Filled 2019-03-01 (×2): qty 2

## 2019-03-01 MED ORDER — ORAL CARE MOUTH RINSE
15.0000 mL | Freq: Two times a day (BID) | OROMUCOSAL | Status: DC
  Administered 2019-03-01 – 2019-03-19 (×25): 15 mL via OROMUCOSAL

## 2019-03-01 MED ORDER — ACETAMINOPHEN 325 MG PO TABS
650.0000 mg | ORAL_TABLET | Freq: Four times a day (QID) | ORAL | Status: DC | PRN
  Administered 2019-03-04 (×2): 650 mg via ORAL
  Filled 2019-03-01 (×2): qty 2

## 2019-03-01 NOTE — Progress Notes (Signed)
Glucostabilizer unable to dose insulin gtt at this time. Charge nurse, [facility] and NP aware. NP suggested turning gtt to 5 ml/hr and recheck in hour. CBG now 173. AC to locate IT number for assistance as well.

## 2019-03-01 NOTE — Progress Notes (Signed)
Pt extubated with no complications, 78% on roomair, respiratory rate 16/min, will continue to monitor.

## 2019-03-01 NOTE — Progress Notes (Signed)
Spoke to NP about troponin going from 4.13 to 10.66. Two EKGs done on patient. ElINK also consulted to see if patient needs any interventions with the cath lab at this time. Both NP and ELINK said patient is on a heparin gtt, and cardiology can consult in the AM. NP put in order for cardiology consult Continue to monitor.

## 2019-03-01 NOTE — Consult Note (Signed)
[facility] Consultation Note  Patient ID: Richard [name], MRN: [id number], DOB/AGE: 03/12/1956 64 y.o. Admit date: 02/28/2019   Date of Consult: 03/01/2019 Primary Physician: [facility] [place] Primary Cardiologist: None  Chief Complaint:  Chief Complaint  Patient presents with  . Code Sepsis   Reason for Consult: Myocardial infarction  HPI: 63 y.o. male with known apparent pancreatic cancer status post resection apparently improved with significant diabetes hypertension hyperlipidemia chronic kidney disease anemia and coronary atherosclerosis status post previous stenting.  The patient apparently had some cocaine use and was found unresponsive when seen by the EMS.  At that time the patient has improved with responsiveness from DKA and renal failure with anemia.  During that time he had sinus tachycardia with left axis deviation and ST depression in the septal leads.  There was no evidence of congestive heart failure at that time.  Troponin has elevated to 14.2 consistent with non-ST elevation myocardial infarction.  The patient is not able to respond well to questions and unable to give a reasonable history but has had improvement since admission.  Currently the patient is hemodynamically stable  Past Medical History:  Diagnosis Date  . Atrial fibrillation ([place])    per patient  . Cancer ([place])   . CHF (congestive heart failure) ([place])   . Chronic back pain   . Chronic leg pain   . Coronary artery disease   . Diabetes mellitus without complication ([place])   . Hypertension   . Neuropathy       Surgical History:  Past Surgical History:  Procedure Laterality Date  . CORONARY ANGIOPLASTY WITH STENT PLACEMENT    . ESOPHAGOGASTRODUODENOSCOPY N/A 09/11/2017   Procedure: ESOPHAGOGASTRODUODENOSCOPY (EGD);  Surgeon: Virgel Manifold, MD;  Location[facility] ENDOSCOPY;  Service: Endoscopy;  Laterality: N/A;  . LOWER EXTREMITY ANGIOGRAPHY Left 11/17/2018    Procedure: Lower Extremity Angiography, with possible intervention;  Surgeon: Algernon Huxley, MD;  Location: [place] CV LAB;  Service: Cardiovascular;  Laterality: Left;  . pancreas removed       Home Meds: Prior to Admission medications   Medication Sig Start Date End Date Taking? Authorizing Provider  amitriptyline (ELAVIL) 50 MG tablet Take 1 tablet (50 mg total) by mouth at bedtime. 08/04/18  Yes Salary, Avel Peace, MD  apixaban (ELIQUIS) 5 MG TABS tablet Take 1 tablet (5 mg total) by mouth 2 (two) times daily. 09/23/18  Yes Gouru, Illene Silver, MD  atorvastatin (LIPITOR) 40 MG tablet Take 1 tablet (40 mg total) by mouth every evening. 11/18/18  Yes Dustin Flock, MD  gabapentin (NEURONTIN) 100 MG capsule Take 1 capsule (100 mg total) by mouth 3 (three) times daily. 08/04/18  Yes Salary, Montell D, MD  insulin aspart (NOVOLOG) 100 UNIT/ML injection Inject 0-5 Units into the skin at bedtime. CBG < 70: implement hypoglycemia protocol CBG 70 - 120: 0 units CBG 121 - 150: 0 units CBG 151 - 200: 0 units CBG 201 - 250: 2 units CBG 251 - 300: 3 units CBG 301 - 350: 4 units CBG 351 - 400: 5 units CBG > 400: call MD 11/18/18  Yes Dustin Flock, MD  LEVEMIR FLEXTOUCH 100 UNIT/ML Pen Inject 17 Units into the skin daily. 09/25/18  Yes [provider]  Multiple Vitamin (MULTIVITAMIN WITH MINERALS) TABS tablet Take 1 tablet by mouth daily. 01/12/18  Yes Gouru, Aruna, MD  NOVOLOG FLEXPEN 100 UNIT/ML FlexPen Inject 5 Units into the skin 3 (three) times daily before meals. 09/25/18  Yes [provider]  traZODone (DESYREL) 50 MG tablet Take 1 tablet (50 mg total) by mouth at bedtime as needed for sleep. 08/04/18  Yes Salary, Avel Peace, MD  ACCU-CHEK AVIVA PLUS test strip U ONCE TO BID UTD 09/23/18   Nicholes Mango, MD  ACCU-CHEK SOFTCLIX LANCETS lancets TEST 1-2 XD 09/23/18   Gouru, Illene Silver, MD  acetaminophen (TYLENOL) 325 MG tablet Take 1 tablet (325 mg total) by mouth every 6 (six) hours as  needed for mild pain (or Fever >/= 101). 01/11/18   Gouru, Aruna, MD  aspirin 81 MG chewable tablet Chew 1 tablet (81 mg total) by mouth daily. Patient not taking: Reported on 11/11/2018 01/12/18   Nicholes Mango, MD  Blood Glucose Monitoring Suppl (ACCU-CHEK AVIVA PLUS) w/Device KIT U TO TEST ONCE TO BID 09/23/18   Gouru, Illene Silver, MD  HYDROcodone-acetaminophen (NORCO/VICODIN) 5-325 MG tablet Take 1-2 tablets by mouth every 6 (six) hours as needed for moderate pain. Patient not taking: Reported on 02/28/2019 11/18/18   Dustin Flock, MD  Needles & Syringes MISC Use as directed 09/23/18   Nicholes Mango, MD    Inpatient Medications:  . chlorhexidine gluconate (MEDLINE KIT)  15 mL Mouth Rinse BID  . insulin aspart  0-9 Units Subcutaneous Q4H  . mouth rinse  15 mL Mouth Rinse 10 times per day   . dextrose 5 % with kcl 75 mL/hr at 03/01/19 0904  . heparin 1,050 Units/hr (03/01/19 0736)    Allergies: No Known Allergies  Social History   Socioeconomic History  . Marital status: Single    Spouse name: Not on file  . Number of children: Not on file  . Years of education: Not on file  . Highest education level: Not on file  Occupational History  . Not on file  Social Needs  . Financial resource strain: Not on file  . Food insecurity:    Worry: Not on file    Inability: Not on file  . Transportation needs:    Medical: Not on file    Non-medical: Not on file  Tobacco Use  . Smoking status: Current Every Day Smoker    Packs/day: 0.50  . Smokeless tobacco: Never Used  Substance and Sexual Activity  . Alcohol use: No    Alcohol/week: 0.0 standard drinks  . 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 Cocaine used over a year ago per pt.  . Sexual activity: Not on file  Lifestyle  . Physical activity:    Days per week: Not on file    Minutes per session: Not on file  . Stress: Not on file  Relationships  . Social connections:    Talks on phone: Not on file     Gets together: Not on file    Attends religious service: Not on file    Active member of club or organization: Not on file    Attends meetings of clubs or organizations: Not on file    Relationship status: Not on file  . Intimate partner violence:    Fear of current or ex partner: Not on file    Emotionally abused: Not on file    Physically abused: Not on file    Forced sexual activity: Not on file  Other Topics Concern  . Not on file  Social History Narrative  . Not on file     Family History  Problem Relation Age of Onset  . CAD Mother   .  Diabetes Mellitus II Mother   . Diabetes Mellitus II Father      Review of Systems Not assessed due to obtundation Labs: Recent Labs    02/28/19 1550 02/28/19 1831 03/01/19 0038 03/01/19 0724  TROPONINI 2.71* 4.13* 10.66* 14.28*   Lab Results  Component Value Date   WBC 16.9 (H) 03/01/2019   HGB 7.2 (L) 03/01/2019   HCT 22.2 (L) 03/01/2019   MCV 81.6 03/01/2019   PLT 331 03/01/2019    Recent Labs  Lab 02/28/19 0948  03/01/19 0724  NA 141   < > 153*  K 4.7   < > 3.2*  CL 101   < > 115*  CO2 8*   < > 25  BUN 72*   < > 86*  CREATININE 2.73*   < > 1.80*  CALCIUM 7.6*   < > 7.6*  PROT 5.9*  --   --   BILITOT 2.1*  --   --   ALKPHOS 40  --   --   ALT 14  --   --   AST 23  --   --   GLUCOSE 923*   < > 180*   < > = values in this interval not displayed.   Lab Results  Component Value Date   CHOL 186 08/10/2015   HDL 25 (L) 08/10/2015   LDLCALC 132 (H) 08/10/2015   TRIG 144 08/10/2015   No results found for: DDIMER  Radiology/Studies:  Ct Head Wo Contrast  Result Date: 02/28/2019 CLINICAL DATA:  63 year old male with altered mental status EXAM: CT HEAD WITHOUT CONTRAST TECHNIQUE: Contiguous axial images were obtained from the base of the skull through the vertex without intravenous contrast. COMPARISON:  11/11/2018, 09/20/2018 FINDINGS: Brain: No acute intracranial hemorrhage. No midline shift or mass effect.  Gray-white differentiation maintained. Redemonstration of mild periventricular white matter disease. Unremarkable appearance of the ventricular system. Vascular: Intracranial atherosclerosis Skull: No acute fracture.  No aggressive bone lesion identified. Sinuses/Orbits: Unremarkable appearance of the orbits. Mastoid air cells clear. No middle ear effusion. No significant sinus disease. Other: None IMPRESSION: No acute intracranial abnormality. 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   On: 02/28/2019 12:34   Dg Chest Port 1 View  Result Date: 02/28/2019 CLINICAL DATA:  Central line placement, endotracheal and orogastric tube placement. EXAM: PORTABLE CHEST 1 VIEW COMPARISON:  Radiograph of November 11, 2018. FINDINGS: The heart size and mediastinal contours are within normal limits. Endotracheal and nasogastric tubes are in grossly good position. Right internal jugular catheter is noted with distal tip in expected position of the SVC. No pneumothorax or pleural effusion is noted. Both lungs are clear. The visualized skeletal structures are unremarkable. IMPRESSION: Endotracheal and nasogastric tubes are in grossly good position. Right internal jugular catheter is in good position. No acute cardiopulmonary abnormality seen. Electronically Signed   By: Marijo Conception M.D.   On: 02/28/2019 10:23    EKG: Sinus tachycardia with left axis deviation and septal ST depression  Weights: Filed Weights   02/28/19 1040 02/28/19 1443 03/01/19 0416  Weight: 54 kg 55 kg 57.3 kg     Physical Exam: Blood pressure 133/72, pulse 100, temperature 98.2 F (36.8 C), resp. rate 19, height _0  (1.651 m), weight 57.3 kg, SpO2 100 %. Body mass index is 21.02 kg/m. General: Ill-appearing, not well nourished, in no acute distress. Head eyes ears nose throat: Normocephalic, atraumatic, sclera non-icteric, no xanthomas, nares  are  without discharge. No apparent thyromegaly and/or mass  Lungs: Normal respiratory effort.  no wheezes, no rales, no rhonchi.  Heart: RRR with normal S1 S2. no murmur gallop, no rub, PMI is normal size and placement, carotid upstroke normal without bruit, jugular venous pressure is normal Abdomen: Soft, non-tender, non-distended with normoactive bowel sounds. No hepatomegaly. No rebound/guarding. No obvious abdominal masses. Abdominal aorta is normal size without bruit Extremities: Trace edema.  Positive foot cyanosis, no clubbing, positive ulcers  Peripheral : 2+ bilateral upper extremity pulses, 2+ bilateral femoral pulses, no + bilateral dorsal pedal pulse Neuro: Not alert and oriented. No facial asymmetry. No focal deficit. Moves all extremities spontaneously. Musculoskeletal: Normal muscle tone without kyphosis Psych: Does not responds to questions appropriately with a normal affect.    Assessment: 63 year old male with history of pancreatic cancer paroxysmal nonvalvular atrial fibrillation diabetes hypertension hyperlipidemia chronic kidney disease anemia coronary disease with previous stenting had unresponsiveness due to DKA and non-ST elevation myocardial infarction hemodynamically stable at this time  Plan: 1.  Continue supportive care post cocaine use and unconsciousness 2.  No additional medication management for blood pressure control and myocardial infarction due to concerns of hypotension although would consider low-dose beta-blocker as able when becoming more responsive 3.  Continue heparin for 24 to 48 hours more after myocardial infarction and continuation of aspirin as able 4.  No additional anticoagulation after due to vague history of atrial fibrillation currently in normal sinus rhythm and concerns of significant anemia 5.  No further cardiac diagnostics necessary at this time 6.  Begin ambulation and follow for improvements and need for adjustments of medication  thereafter  Signed, Corey Skains M.D. [facility] 03/01/2019, 11:37 AM

## 2019-03-01 NOTE — Consult Note (Signed)
Pharmacy Antibiotic Note  Richard [name] is a 63 y.o. male admitted on 02/28/2019 with sepsis.  Pharmacy has been consulted for vancomycin and cefepime dosing. He is being admitted for acute hypoxic respiratory failure most likely due to combination of DKA, cocaine abuse and severe hypothermia. In the ED he received 2 grams of IV cefepime and 1 gram of IV vancomycin. His SCr is elevated well above his baseline level of 0.7-0.8  Plan: 1) Vancomycin 1000 mg IV LD has been administered. Given elevated SCr, random vancomycin level approximately 20 hours after loading dose  Goal AUC 400-550 SCr used: 2.73 (baseline 0.7-0.8 mg/dL)  2) cefepime 1 gram IV every 12 hours   5/10:  Scr improved. Will increase Cefepime to 2 gram IV q12h. Will order Vancomycin 1 gram IV q36h. Patient noted to have gangrenous toe. F/u renal fxn.  Goal AUC 400-550. Expected AUC: 519 SCr used: 1.88    Used TBW 57.3kg since it was less than IBW 61.5kg     Height: 5\' 5"  (165.1 cm) Weight: 126 lb 5.2 oz (57.3 kg) IBW/kg (Calculated) : 61.5  Temp (24hrs), Avg:99.2 F (37.3 C), Min:89.1 F (31.7 C), Max:100.6 F (38.1 C)  Recent Labs  Lab 02/28/19 0948 02/28/19 2038 03/01/19 0005 03/01/19 0404 03/01/19 0621  WBC 22.4* 18.8*  --  16.9*  --   CREATININE 2.73* 2.69* 2.33* 1.88*  --   LATICACIDVEN 1.7  --   --   --   --   VANCORANDOM  --   --   --   --  9    Estimated Creatinine Clearance: 32.2 mL/min (A) (by C-G formula based on SCr of 1.88 mg/dL (H)).    No Known Allergies  Antimicrobials this admission: vancomycin 5/9 >>  cefepime 5/9 >>   Microbiology results: 5/9 BCx: pending 5/9 UCx: pending  5/9 SARS CoV-2: negative  5/9 MRSA PCR: negative  Thank you for allowing pharmacy to be a part of this patient's care.  Bluford Sedler A, PharmD 03/01/2019 7:36 AM

## 2019-03-01 NOTE — Progress Notes (Signed)
[place] [facility] [place] NAME: Richard [name]    MR#:  [id number]  DATE OF BIRTH:  1956-09-29  SUBJECTIVE:  Case discussed with intensivist, patient extubated this morning, remains obtunded, antibiotics okay to be discontinued given no concern for sepsis, case is compounded by history of pancreatic cancer-CA 19.9 pending to assess for possible cancer recurrence, troponin XIV-cardiology to see  REVIEW OF SYSTEMS:  CONSTITUTIONAL: No fever, fatigue or weakness.  EYES: No blurred or double vision.  EARS, NOSE, AND THROAT: No tinnitus or ear pain.  RESPIRATORY: No cough, shortness of breath, wheezing or hemoptysis.  CARDIOVASCULAR: No chest pain, orthopnea, edema.  GASTROINTESTINAL: No nausea, vomiting, diarrhea or abdominal pain.  GENITOURINARY: No dysuria, hematuria.  ENDOCRINE: No polyuria, nocturia,  HEMATOLOGY: No anemia, easy bruising or bleeding SKIN: No rash or lesion. MUSCULOSKELETAL: No joint pain or arthritis.   NEUROLOGIC: No tingling, numbness, weakness.  PSYCHIATRY: No anxiety or depression.   ROS  DRUG ALLERGIES:  No Known Allergies  VITALS:  Blood pressure 133/72, pulse 100, temperature 98.2 F (36.8 C), resp. rate 19, height 5\' 5"  (1.651 m), weight 57.3 kg, SpO2 100 %.  PHYSICAL EXAMINATION:  GENERAL:  63 y.o.-year-old patient lying in the bed with no acute distress.  EYES: Pupils equal, round, reactive to light and accommodation. No scleral icterus. Extraocular muscles intact.  HEENT: Head atraumatic, normocephalic. Oropharynx and nasopharynx clear.  NECK:  Supple, no jugular venous distention. No thyroid enlargement, no tenderness.  LUNGS: Normal breath sounds bilaterally, no wheezing, rales,rhonchi or crepitation. No use of accessory muscles of respiration.  CARDIOVASCULAR: S1, S2 normal. No murmurs, rubs, or gallops.  ABDOMEN: Soft, nontender, nondistended. Bowel sounds present. No organomegaly or mass.  EXTREMITIES: No  pedal edema, cyanosis, or clubbing.  NEUROLOGIC: Cranial nerves II through XII are intact. Muscle strength 5/5 in all extremities. Sensation intact. Gait not checked.  PSYCHIATRIC: The patient is alert and oriented x 3.  SKIN: No obvious rash, lesion, or ulcer.   Physical Exam LABORATORY PANEL:   CBC Recent Labs  Lab 03/01/19 0404  WBC 16.9*  HGB 7.2*  HCT 22.2*  PLT 331   ------------------------------------------------------------------------------------------------------------------  Chemistries  Recent Labs  Lab 02/28/19 0948  03/01/19 0724  NA 141   < > 153*  K 4.7   < > 3.2*  CL 101   < > 115*  CO2 8*   < > 25  GLUCOSE 923*   < > 180*  BUN 72*   < > 86*  CREATININE 2.73*   < > 1.80*  CALCIUM 7.6*   < > 7.6*  MG  --    < > 2.1  AST 23  --   --   ALT 14  --   --   ALKPHOS 40  --   --   BILITOT 2.1*  --   --    < > = values in this interval not displayed.   ------------------------------------------------------------------------------------------------------------------  Cardiac Enzymes Recent Labs  Lab 03/01/19 0038 03/01/19 0724  TROPONINI 10.66* 14.28*   ------------------------------------------------------------------------------------------------------------------  RADIOLOGY:  Ct Head Wo Contrast  Result Date: 02/28/2019 CLINICAL DATA:  63 year old male with altered mental status EXAM: CT HEAD WITHOUT CONTRAST TECHNIQUE: Contiguous axial images were obtained from the base of the skull through the vertex without intravenous contrast. COMPARISON:  11/11/2018, 09/20/2018 FINDINGS: Brain: No acute intracranial hemorrhage. No midline shift or mass effect. Gray-white differentiation maintained. Redemonstration of mild periventricular white matter disease. Unremarkable appearance of  the ventricular system. Vascular: Intracranial atherosclerosis Skull: No acute fracture.  No aggressive bone lesion identified. Sinuses/Orbits: Unremarkable appearance of the orbits.  Mastoid air cells clear. No middle ear effusion. No significant sinus disease. Other: None IMPRESSION: No acute intracranial abnormality. 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   On: 02/28/2019 12:34   Dg Chest Port 1 View  Result Date: 02/28/2019 CLINICAL DATA:  Central line placement, endotracheal and orogastric tube placement. EXAM: PORTABLE CHEST 1 VIEW COMPARISON:  Radiograph of November 11, 2018. FINDINGS: The heart size and mediastinal contours are within normal limits. Endotracheal and nasogastric tubes are in grossly good position. Right internal jugular catheter is noted with distal tip in expected position of the SVC. No pneumothorax or pleural effusion is noted. Both lungs are clear. The visualized skeletal structures are unremarkable. IMPRESSION: Endotracheal and nasogastric tubes are in grossly good position. Right internal jugular catheter is in good position. No acute cardiopulmonary abnormality seen. Electronically Signed   By: Marijo Conception M.D.   On: 02/28/2019 10:23    ASSESSMENT AND PLAN:  *Acute hypoxic respiratory failure Most likely multifactorial in etiology-due to DKA, cocaine abuse, severe hypothermia, and possible non-STEMI Continue ICU protocol, status post extubation this morning, supplemental oxygen wean as tolerated   *Acute non-STEMI with history of CAD Likely exacerbated by cocaine concurrent use, DKA, continue heparin drip, cardiology to see, consider aspirin, statin therapy  *Acute DKA with chronic diabetes mellitus type 2 Most likely secondary to medication noncompliance Resolved on our DKA protocol   *Acute kidney injury Improving Most likely secondary to above Avoid nephrotoxic agents, IV fluids for rehydration, strict I&O monitoring, daily weights, BMP in the morning  *Acute cocaine abuse Supportive care, cessation counseling when sensorium  improves  *History of A. Fib Not on any AV nodal blocking agents on admission, unknown if patient is compliant with Eliquis Continue heparin drip, amiodarone drip  *Acute severe hypothermia Resolved with bear hugger Sepsis thought to be less likely-antibiotics discontinued   *Acute possible sepsis Ruled out Discontinue antibiotics  *Acute distal left foot dry gangrene Noted history of peripheral vascular disease, status post right transmetatarsal amputation in the past, noted history of A. fib Vascular surgery consulted for expert opinion, continue heparin drip for now  *History of pancreatic cancer That is post Whipple procedure in 2018  Follow-up on CA 19.9 to evaluate for possible tumor recurrence   Disposition pending clinical course DVT prophylaxis-on heparin drip GI prophylaxis-Protonix daily  All the records are reviewed and case discussed with Care Management/Social Workerr. Management plans discussed with the patient, family and they are in agreement.  CODE STATUS: full  TOTAL TIME TAKING CARE OF THIS PATIENT: 35 minutes.   POSSIBLE D/C IN 2-5 DAYS, DEPENDING ON CLINICAL CONDITION.   Avel Peace Richardo Popoff M.D on 03/01/2019   Between 7am to 6pm - Pager - [phone]  After 6pm go to [email or web address] - password [email or web address] [facility]  Office  [phone]  CC: Primary care physician; [facility], [place]  Note: This dictation was prepared with Diplomatic Services operational officer dictation along with smaller phrase technology. Any transcriptional errors that result from this process are unintentional.

## 2019-03-01 NOTE — Progress Notes (Signed)
Spoke to NP about cbg down from 152 to 119 and insulin gtt was turned off. Patient will be transitioned.

## 2019-03-01 NOTE — Progress Notes (Signed)
Initial Nutrition Assessment  RD working remotely.  DOCUMENTATION CODES:   Not applicable  INTERVENTION:  Initiate Vital AF 1.2 at 20 mL/hr and advance by 20 mL/hr every 8 hours to goal rate of 60 mL/hr (1440 mL goal daily volume). Provides 1728 kcal, 108 grams of protein, 1166 mL H2O daily.  Provide minimum free water flush of 30 mL Q4hrs to maintain tube patency.  Monitor magnesium, potassium, and phosphorus daily for at least 3 days, MD to replete as needed, as pt is at risk for refeeding syndrome.  NUTRITION DIAGNOSIS:   Inadequate oral intake related to inability to eat as evidenced by NPO status.  GOAL:   Provide needs based on ASPEN/SCCM guidelines  MONITOR:   Vent status, Labs, Weight trends, TF tolerance, Skin, I & O's  REASON FOR ASSESSMENT:   Ventilator, Consult Enteral/tube feeding initiation and management  ASSESSMENT:   63 year old male with PMHx of HTN, DM, CAD, neuropathy, chronic back pain, chronic leg pain, CHF, A-fib, hx cocaine abuse, hx pancreatic cancer s/p Whipple procedure who is admitted with AMS, hyperthermia, DKA, respiratory failure requiring intubation on 5/9, also with gangrenous left great toe.   Patient is intubated and sedated. On PRVC mode with FiOw 24% and PEEP 5 cmH2O. Abdomen is soft per RN documentation. Last BM unknown/PTA. Patient is well-known to this RD from multiple previous admissions. His UBW was around 200 lbs. He was 185 lbs in 11/2016. Patient has lost weight over the past few years and weight has gotten as low as 121.03 lbs. Patient is currently 57.3 kg (126.32 lbs). Patient has previously met criteria for severe chronic malnutrition. RD suspects patient is still malnourished, but unable to confirm without completing NFPE.  Enteral Access: 16 Fr. NGT placed 5/9; per chest x-ray 5/9 NGT is in "grossly good position;" no cm marking documented at this time  MAP: 61-93 mmHg  Patient is currently intubated on ventilator support Ve:  10 L/min Temp (24hrs), Avg:99.2 F (37.3 C), Min:89.1 F (31.7 C), Max:100.6 F (38.1 C)  Propofol: N/A  Medications reviewed and include: Novolog 0-20 units Q4hrs, pantoprazole, cefepime, heparin gtt, LR @ 150 mL/hr, norepinephrine gtt at 6 mcg/min, vancomycin.  Labs reviewed: CBG 119-204, Sodium 154, Potassium 3.2, BUN 86, Creatinine 1.88.  I/O: 725 mL UOP yesterday  NUTRITION - FOCUSED PHYSICAL EXAM:  Unable to complete at this time.  Diet Order:   Diet Order            Diet NPO time specified  Diet effective now             EDUCATION NEEDS:   No education needs have been identified at this time  Skin:  Skin Assessment: Skin Integrity Issues:(gangrenous left great toe)  Last BM:  Unknown/PTA  Height:   Ht Readings from Last 1 Encounters:  02/28/19 5' 5"  (1.651 m)   Weight:   Wt Readings from Last 1 Encounters:  03/01/19 57.3 kg   Ideal Body Weight:  61.8 kg  BMI:  Body mass index is 21.02 kg/m.  Estimated Nutritional Needs:   Kcal:  1703 (PSU 2003b w/ MSJ 1294, Ve 10, Tmax 38.1)  Protein:  85-105 grams (1.5-1.8 grams/kg)  Fluid:  1.8 L/day (30 mL/kg)  Willey Blade, MS, RD, LDN Office: [phone] Pager: [phone] After Hours/Weekend Pager: [phone]

## 2019-03-01 NOTE — Consult Note (Signed)
[place] for heparin drip management Indication: atrial fibrillation  Patient Measurements: Height: 5\' 5"  (165.1 cm) Weight: 126 lb 5.2 oz (57.3 kg) IBW/kg (Calculated) : 61.5  Vital Signs: Temp: 98.4 F (36.9 C) (05/10 0715) BP: 120/72 (05/10 0715) Pulse Rate: 102 (05/10 0715)  Labs: Recent Labs    02/28/19 0948 02/28/19 1550 02/28/19 1831 02/28/19 2038 02/28/19 2300 03/01/19 0005 03/01/19 0038 03/01/19 0404 03/01/19 0724  HGB 7.7*  --   --  7.9*  --   --   --  7.2*  --   HCT 26.9*  --   --  25.5*  --   --   --  22.2*  --   PLT 417*  --   --  370  --   --   --  331  --   APTT  --   --   --   --  135*  --   --   --  98*  LABPROT  --  18.2*  --   --   --   --   --   --   --   INR  --  1.5*  --   --   --   --   --   --   --   HEPARINUNFRC  --   --   --   --  0.65  --   --   --  0.43  CREATININE 2.73*  --   --  2.69*  --  2.33*  --  1.88*  --   TROPONINI 2.31* 2.71* 4.13*  --   --   --  10.66*  --   --     Estimated Creatinine Clearance: 32.2 mL/min (A) (by C-G formula based on SCr of 1.88 mg/dL (H)).   Medical History: Past Medical History:  Diagnosis Date  . Atrial fibrillation ([place])    per patient  . Cancer ([place])   . CHF (congestive heart failure) ([place])   . Chronic back pain   . Chronic leg pain   . Coronary artery disease   . Diabetes mellitus without complication ([place])   . Hypertension   . Neuropathy    Assessment: 75 YOM with a h/o afib on apixaban PTA presents to the ED with AMS. I spoke with his significant other, Rollene Fare, who tells me she is certain he did not take this morning's apixaban dose but is unsure of the administration prior to that. He was admitted to [facility] in January 2020 and received heparin. I am using that information to guide dosing. Initial troponin 2.31, baseline hgb 7.7, PLT 417  05/09 @ 2300 HL 0.65; aPTT 135 seconds. Both levels still not correlating. Will continue to dose off of aPTT and  decrease rate to 1050 units/hr and will recheck HL/aPTT @ 0700, H/h still low, but stable trending, trops continue to rise, will continue to monitor.  Goal of Therapy:  Heparin level 0.3-0.7 units/ml aPTT 66-102s Monitor platelets by anticoagulation protocol: Yes   Plan:  5/10 0724 aPTT=98, HL=0.43. HL and aPTT now appear to be correlating-will transition to following HL levels. Will order next HL with am labs.  Pearl Bents A, PharmD 03/01/2019,8:08 AM

## 2019-03-01 NOTE — Consult Note (Signed)
PCCM PROGRESS NOTE   PT PROFILE/INITIAL PRESENTATION: 63 y.o. male with hx of pancreatic cancer (resected 2018 and followed by [facility] oncology) DM, CAD (Cardiologist: Humphrey Rolls), PVD, severe protein-calorie malnutrition adm via EMS>ED with AMS, hypothermia, DKA agonal respiratory efforts. Intubated immediately upon arrival to ED  MAJOR EVENTS/TEST RESULTS: 05/09 CT head: NAD  INDWELLING DEVICES: ETT 05/09 >> 05/10 R IJ CVL 05/09 >>   MICRO DATA: SARS-CoV-2 5/9 >> NEG MRSA PCR 5/9 >> NEG Urine 5/9 >>  Blood 5/9 >>  ANTIMICROBIALS:  Vanc 05/09 >> 5/10 Cefepime 05/09 >>5/10    SUBJ:  Extubated this morning under my supervision.  Tolerating from a pulmonary point of view.  Very poorly oriented and constant squirming in the bed.  Vitals:   03/01/19 0645 03/01/19 0700 03/01/19 0715 03/01/19 0800  BP: 133/75 126/70 120/72 133/72  Pulse: (!) 102 (!) 103 (!) 102 100  Resp: 17 18 14 19   Temp: 98.4 F (36.9 C) 98.4 F (36.9 C) 98.4 F (36.9 C) 98.2 F (36.8 C)  TempSrc:      SpO2: 100% 100% 100% 100%  Weight:      Height:        Vent Mode: Spontaneous FiO2 (%):  [24 %] 24 % Set Rate:  [15 bmp] 15 bmp Vt Set:  [500 mL] 500 mL PEEP:  [5 cmH20] 5 cmH20 Pressure Support:  [5 cmH20] 5 cmH20 Plateau Pressure:  [14 cmH20-16 cmH20] 14 cmH20   05/09 0701 - 05/10 0700 In: 2504.8 [I.V.:2504.8] Out: 725 [Urine:725]   Intake/Output Summary (Last 24 hours) at 03/01/2019 1300 Last data filed at 03/01/2019 0600 Gross per 24 hour  Intake 2504.84 ml  Output 725 ml  Net 1779.84 ml     EXAM:  Gen: No respiratory distress, psychomotor agitation, poorly oriented HEENT: NCAT, sclerae white Neck: No JVD Lungs: Clear throughout Cardiovascular: Regular, no M Abdomen: Soft, NT, NABS Ext: R transmetatarsal amputation, gangrenous (dry) L great toe, no LE edema  Neuro: Cognition poor, otherwise no neurologic deficits Skin: Limited exam, no lesions noted  DATA:   BMP Latest Ref Rng & Units  03/01/2019 03/01/2019 03/01/2019  Glucose 70 - 99 mg/dL 180(H) 111(H) 224(H)  BUN 8 - 23 mg/dL 86(H) 86(H) 86(H)  Creatinine 0.61 - 1.24 mg/dL 1.80(H) 1.88(H) 2.33(H)  Sodium 135 - 145 mmol/L 153(H) 154(H) 154(H)  Potassium 3.5 - 5.1 mmol/L 3.2(L) 3.2(L) 2.6(LL)  Chloride 98 - 111 mmol/L 115(H) 114(H) 110  CO2 22 - 32 mmol/L 25 30 27   Calcium 8.9 - 10.3 mg/dL 7.6(L) 7.7(L) 7.5(L)    CBC Latest Ref Rng & Units 03/01/2019 02/28/2019 02/28/2019  WBC 4.0 - 10.5 K/uL 16.9(H) 18.8(H) 22.4(H)  Hemoglobin 13.0 - 17.0 g/dL 7.2(L) 7.9(L) 7.7(L)  Hematocrit 39.0 - 52.0 % 22.2(L) 25.5(L) 26.9(L)  Platelets 150 - 400 K/uL 331 370 417(H)    Hepatic Function Latest Ref Rng & Units 02/28/2019 11/11/2018 09/20/2018  Total Protein 6.5 - 8.1 g/dL 5.9(L) 7.4 6.9  Albumin 3.5 - 5.0 g/dL 2.3(L) 3.8 3.8  AST 15 - 41 U/L 23 17 22   ALT 0 - 44 U/L 14 13 16   Alk Phosphatase 38 - 126 U/L 40 31(L) 26(L)  Total Bilirubin 0.3 - 1.2 mg/dL 2.1(H) 0.7 0.9  Bilirubin, Direct 0.0 - 0.2 mg/dL - - -     Cardiac Panel (last 3 results) Recent Labs    02/28/19 1831 03/01/19 0038 03/01/19 0724  TROPONINI 4.13* 10.66* 14.28*    BNP (last 3 results) No results  for input(s): BNP in the last 8760 hours.  ProBNP (last 3 results) No results for input(s): PROBNP in the last 8760 hours.    CXR: No new film  I have personally reviewed all chest radiographs reported above including CXRs and CT chest unless otherwise indicated  IMPRESSION:   VDRF, resolved- intubated due to altered mental status and agonal respirations  H/O CAD with elevated troponin I  NSTEMI PAF > NSR presently Hypotension, resolved Severe PVD  Gangrenous L great toe - will need eval prior to DC from hospital AKI, nonoliguric Severe metabolic acidosis, resolved Hypernatremia Hypokalemia Hyperosmolality (calculated serum osm 345) Severe protein-calorie malnutrition History of pancreatic cancer, s/p resection 2018  Last CA 19-9 at [facility]: 66 on  11/22/17  S/P splenectomy 2018 Minimally elevated procalcitonin and leukocytosis  Doubt sepsis Acute on chronic anemia without overt blood loss Acute encephalopathy -likely TME Urine drug screen + for cocaine Poor chronic health and guarded prognosis  PLAN:  Extubated under my supervision this morning Supplemental oxygen as needed Continue ICU hemodynamic monitoring Cardiology consultation requested Per cardiology, continue full dose UFH for AF for 24-48 hours Monitor BMET intermittently Monitor I/Os Correct electrolytes as indicated IVFs adjusted - D5W + KCl  CA19-9 ordered 05/10 - result pending. If rising, consider CT abd Monitor temp, WBC count Micro and abx as above  DVT px: Full dose heparin Monitor CBC intermittently Transfuse per usual guidelines Minimize all sedatives. Can consider dexmedetomidine if needed Palliative care consultation requested  Unable to reach any family members or his significant other (listed as Wynn Banker)  CCM X 35 mins Merton Border, MD PCCM service Mobile [phone] Pager [phone] 03/01/2019 1:00 PM

## 2019-03-01 NOTE — Consult Note (Signed)
ANTICOAGULATION CONSULT NOTE - Initial Consult  Pharmacy Consult for heparin drip management Indication: atrial fibrillation  Patient Measurements: Height: 5\' 5"  (165.1 cm) Weight: 121 lb 4.1 oz (55 kg) IBW/kg (Calculated) : 61.5  Vital Signs: Temp: 99.1 F (37.3 C) (05/10 0030) Temp Source: Bladder (05/09 1443) BP: 106/65 (05/10 0030) Pulse Rate: 117 (05/10 0030)  Labs: Recent Labs    02/28/19 0948 02/28/19 1550 02/28/19 1831 02/28/19 2038 02/28/19 2300  HGB 7.7*  --   --  7.9*  --   HCT 26.9*  --   --  25.5*  --   PLT 417*  --   --  370  --   APTT  --   --   --   --  135*  LABPROT  --  18.2*  --   --   --   INR  --  1.5*  --   --   --   HEPARINUNFRC  --   --   --   --  0.65  CREATININE 2.73*  --   --  2.69*  --   TROPONINI 2.31* 2.71* 4.13*  --   --     Estimated Creatinine Clearance: 21.6 mL/min (A) (by C-G formula based on SCr of 2.69 mg/dL (H)).   Medical History: Past Medical History:  Diagnosis Date  . Atrial fibrillation ([place])    per patient  . Cancer ([place])   . CHF (congestive heart failure) ([place])   . Chronic back pain   . Chronic leg pain   . Coronary artery disease   . Diabetes mellitus without complication ([place])   . Hypertension   . Neuropathy    Assessment: 75 YOM with a h/o afib on apixaban PTA presents to the ED with AMS. I spoke with his significant other, Rollene Fare, who tells me she is certain he did not take this morning's apixaban dose but is unsure of the administration prior to that. He was admitted to [facility] in January 2020 and received heparin. I am using that information to guide dosing. Initial troponin 2.31, baseline hgb 7.7, PLT 417  Goal of Therapy:  Heparin level 0.3-0.7 units/ml aPTT 66-102s Monitor platelets by anticoagulation protocol: Yes   Plan:  05/09 @ 2300 HL 0.65; aPTT 135 seconds. Both levels still not correlating. Will continue to dose off of aPTT and decrease rate to 1050 units/hr and will recheck HL/aPTT @ 0700,  H/h still low, but stable trending, trops continue to rise, will continue to monitor.  Tobie Lords, PharmD 03/01/2019,12:43 AM

## 2019-03-02 ENCOUNTER — Inpatient Hospital Stay: Payer: Medicare Other

## 2019-03-02 DIAGNOSIS — Z7189 Other specified counseling: Secondary | ICD-10-CM

## 2019-03-02 DIAGNOSIS — R7881 Bacteremia: Secondary | ICD-10-CM

## 2019-03-02 DIAGNOSIS — Z515 Encounter for palliative care: Secondary | ICD-10-CM

## 2019-03-02 DIAGNOSIS — E1111 Type 2 diabetes mellitus with ketoacidosis with coma: Secondary | ICD-10-CM

## 2019-03-02 LAB — CBC
HCT: 20.8 % — ABNORMAL LOW (ref 39.0–52.0)
Hemoglobin: 6.8 g/dL — ABNORMAL LOW (ref 13.0–17.0)
MCH: 26.9 pg (ref 26.0–34.0)
MCHC: 32.7 g/dL (ref 30.0–36.0)
MCV: 82.2 fL (ref 80.0–100.0)
Platelets: 314 10*3/uL (ref 150–400)
RBC: 2.53 MIL/uL — ABNORMAL LOW (ref 4.22–5.81)
RDW: 15.8 % — ABNORMAL HIGH (ref 11.5–15.5)
WBC: 20.2 10*3/uL — ABNORMAL HIGH (ref 4.0–10.5)
nRBC: 0.9 % — ABNORMAL HIGH (ref 0.0–0.2)

## 2019-03-02 LAB — COMPREHENSIVE METABOLIC PANEL
ALT: 16 U/L (ref 0–44)
AST: 45 U/L — ABNORMAL HIGH (ref 15–41)
Albumin: 2.5 g/dL — ABNORMAL LOW (ref 3.5–5.0)
Alkaline Phosphatase: 30 U/L — ABNORMAL LOW (ref 38–126)
Anion gap: 7 (ref 5–15)
BUN: 46 mg/dL — ABNORMAL HIGH (ref 8–23)
CO2: 27 mmol/L (ref 22–32)
Calcium: 7.9 mg/dL — ABNORMAL LOW (ref 8.9–10.3)
Chloride: 120 mmol/L — ABNORMAL HIGH (ref 98–111)
Creatinine, Ser: 0.86 mg/dL (ref 0.61–1.24)
GFR calc Af Amer: >60 mL/min (ref >60)
GFR calc non Af Amer: >60 mL/min (ref >60)
Glucose, Bld: 167 mg/dL — ABNORMAL HIGH (ref 70–99)
Potassium: 3.4 mmol/L — ABNORMAL LOW (ref 3.5–5.1)
Sodium: 154 mmol/L — ABNORMAL HIGH (ref 135–145)
Total Bilirubin: 0.6 mg/dL (ref 0.3–1.2)
Total Protein: 6 g/dL — ABNORMAL LOW (ref 6.5–8.1)

## 2019-03-02 LAB — GLUCOSE, CAPILLARY
Glucose-Capillary: 150 mg/dL — ABNORMAL HIGH (ref 70–99)
Glucose-Capillary: 191 mg/dL — ABNORMAL HIGH (ref 70–99)
Glucose-Capillary: 213 mg/dL — ABNORMAL HIGH (ref 70–99)
Glucose-Capillary: 215 mg/dL — ABNORMAL HIGH (ref 70–99)
Glucose-Capillary: 222 mg/dL — ABNORMAL HIGH (ref 70–99)
Glucose-Capillary: 230 mg/dL — ABNORMAL HIGH (ref 70–99)
Glucose-Capillary: 259 mg/dL — ABNORMAL HIGH (ref 70–99)

## 2019-03-02 LAB — HEMOGLOBIN A1C
Hgb A1c MFr Bld: >15.5 % — ABNORMAL HIGH (ref 4.8–5.6)
Mean Plasma Glucose: >398 mg/dL

## 2019-03-02 LAB — TSH: TSH: 0.387 u[IU]/mL (ref 0.350–4.500)

## 2019-03-02 LAB — HEPARIN LEVEL (UNFRACTIONATED): Heparin Unfractionated: 0.34 IU/mL (ref 0.30–0.70)

## 2019-03-02 MED ORDER — POTASSIUM CHLORIDE 2 MEQ/ML IV SOLN
INTRAVENOUS | Status: DC
  Administered 2019-03-02 – 2019-03-03 (×3): via INTRAVENOUS
  Filled 2019-03-02 (×4): qty 1000

## 2019-03-02 MED ORDER — SODIUM CHLORIDE 0.9 % IV SOLN
1.0000 g | Freq: Three times a day (TID) | INTRAVENOUS | Status: DC
  Administered 2019-03-02 – 2019-03-03 (×4): 1 g via INTRAVENOUS
  Filled 2019-03-02 (×6): qty 1

## 2019-03-02 NOTE — Consult Note (Signed)
ANTICOAGULATION CONSULT NOTE - Initial Consult  Pharmacy Consult for heparin drip management Indication: atrial fibrillation  Patient Measurements: Height: 5\' 5"  (165.1 cm) Weight: 126 lb 5.2 oz (57.3 kg) IBW/kg (Calculated) : 61.5  Vital Signs: Temp: 98.6 F (37 C) (05/11 0400) Temp Source: Bladder (05/11 0400) BP: 117/74 (05/11 0600) Pulse Rate: 107 (05/11 0600)  Labs: Recent Labs    02/28/19 1550 02/28/19 1831 02/28/19 2038 02/28/19 2300  03/01/19 0038 03/01/19 0404 03/01/19 0724 03/02/19 0423  HGB  --   --  7.9*  --   --   --  7.2*  --  6.8*  HCT  --   --  25.5*  --   --   --  22.2*  --  20.8*  PLT  --   --  370  --   --   --  331  --  314  APTT  --   --   --  135*  --   --   --  98*  --   LABPROT 18.2*  --   --   --   --   --   --   --   --   INR 1.5*  --   --   --   --   --   --   --   --   HEPARINUNFRC  --   --   --  0.65  --   --   --  0.43 0.34  CREATININE  --   --  2.69*  --    < >  --  1.88* 1.80* 0.86  TROPONINI 2.71* 4.13*  --   --   --  10.66*  --  14.28*  --    < > = values in this interval not displayed.    Estimated Creatinine Clearance: 70.3 mL/min (by C-G formula based on SCr of 0.86 mg/dL).   Medical History: Past Medical History:  Diagnosis Date  . Atrial fibrillation ([place])    per patient  . Cancer ([place])   . CHF (congestive heart failure) ([place])   . Chronic back pain   . Chronic leg pain   . Coronary artery disease   . Diabetes mellitus without complication ([place])   . Hypertension   . Neuropathy    Assessment: 33 YOM with a h/o afib on apixaban PTA presents to the ED with AMS. I spoke with his significant other, Rollene Fare, who tells me she is certain he did not take this morning's apixaban dose but is unsure of the administration prior to that. He was admitted to [facility] in January 2020 and received heparin. I am using that information to guide dosing. Initial troponin 2.31, baseline hgb 7.7, PLT 417  Goal of Therapy:  Heparin level  0.3-0.7 units/ml aPTT 66-102s Monitor platelets by anticoagulation protocol: Yes   Plan:  05/11 @ 0500 HL 0.34 therapeutic. Will continue current rate and recheck w/ am labs., patient's hgb 6.8, needs blood transfusion, will continue to monitor.  Tobie Lords, PharmD 03/02/2019,6:07 AM

## 2019-03-02 NOTE — Progress Notes (Signed)
Patient ID: Richard [name], male   DOB: [birth year], 63 y.o.   MRN: [id number]  [employer] PROGRESS NOTE  Richard [name] MAU:[id number] DOB: 1956/06/23 DOA: 02/28/2019 PCP: [facility]  HPI/Subjective: Patient answered a few yes or no questions.  Objective: Vitals:   03/02/19 1600 03/02/19 1700  BP: (!) 114/58 115/67  Pulse:  96  Resp: 17 16  Temp:    SpO2:  100%    Filed Weights   02/28/19 1040 02/28/19 1443 03/01/19 0416  Weight: 54 kg 55 kg 57.3 kg    ROS: Review of Systems  Unable to perform ROS: Acuity of condition  Respiratory: Negative for shortness of breath.   Cardiovascular: Negative for chest pain.  Gastrointestinal: Negative for abdominal pain.   Exam: Physical Exam  HENT:  Nose: No mucosal edema.  Mouth/Throat: No oropharyngeal exudate or posterior oropharyngeal edema.  Eyes: Pupils are equal, round, and reactive to light. Conjunctivae, EOM and lids are normal.  Neck: No JVD present. Carotid bruit is not present. No edema present. No thyroid mass and no thyromegaly present.  Cardiovascular: S1 normal and S2 normal. Exam reveals no gallop.  No murmur heard. Pulses:      Dorsalis pedis pulses are 2+ on the right side and 2+ on the left side.  Respiratory: No respiratory distress. He has no wheezes. He has no rhonchi. He has no rales.  GI: Soft. Bowel sounds are normal. There is no abdominal tenderness.  Musculoskeletal:     Right ankle: He exhibits no swelling.     Left ankle: He exhibits no swelling.  Lymphadenopathy:    He has no cervical adenopathy.  Neurological: He is alert.  Moved his arms on his own.  Skin: Skin is warm. Nails show no clubbing.  Left first toe gangrenous.  Prior amputation of the right trans-met.  Psychiatric: His affect is blunt.      Data Reviewed: Basic Metabolic Panel: Recent Labs  Lab 02/28/19 2038 03/01/19 0005 03/01/19 0404 03/01/19 0724 03/02/19 0423  NA 147* 154* 154* 153* 154*  K  3.5 2.6* 3.2* 3.2* 3.4*  CL 110 110 114* 115* 120*  CO2 15* _0 GLUCOSE 425* 224* 111* 180* 167*  BUN 83* 86* 86* 86* 46*  CREATININE 2.69* 2.33* 1.88* 1.80* 0.86  CALCIUM 7.7* 7.5* 7.7* 7.6* 7.9*  MG 2.2  --   --  2.1  --   PHOS 3.8  --   --  1.7*  --    Liver Function Tests: Recent Labs  Lab 02/28/19 0948 03/02/19 0423  AST 23 45*  ALT 14 16  ALKPHOS 40 30*  BILITOT 2.1* 0.6  PROT 5.9* 6.0*  ALBUMIN 2.3* 2.5*   Recent Labs  Lab 02/28/19 0948  LIPASE 27   No results for input(s): AMMONIA in the last 168 hours. CBC: Recent Labs  Lab 02/28/19 0948 02/28/19 2038 03/01/19 0404 03/02/19 0423  WBC 22.4* 18.8* 16.9* 20.2*  NEUTROABS 18.0*  --   --   --   HGB 7.7* 7.9* 7.2* 6.8*  HCT 26.9* 25.5* 22.2* 20.8*  MCV 94.7 86.1 81.6 82.2  PLT 417* 370 331 314   Cardiac Enzymes: Recent Labs  Lab 02/28/19 0948 02/28/19 1550 02/28/19 1831 03/01/19 0038 03/01/19 0724  TROPONINI 2.31* 2.71* 4.13* 10.66* 14.28*    CBG: Recent Labs  Lab 03/02/19 0004 03/02/19 0425 03/02/19 0725 03/02/19 1132 03/02/19 1647  GLUCAP 215* 150* 191* 230* 259*    Recent Results (  from the past 240 hour(s))  Blood Culture (routine x 2)     Status: None (Preliminary result)   Collection Time: 02/28/19  9:48 AM  Result Value Ref Range Status   Specimen Description   Final    BLOOD RIGHT ANTECUBITAL Performed at [facility], [address] [place], [place] [address]    Special Requests   Final    BOTTLES DRAWN AEROBIC AND ANAEROBIC Blood Culture results may not be optimal due to an excessive volume of blood received in culture bottles Performed at [facility], [address], [place], [place] [address]    Culture   Final    Lonell Grandchild NEGATIVE RODS CRITICAL RESULT CALLED TO, READ BACK BY AND VERIFIED WITH:  Donnella Sham [phone] FCP Performed at [facility], [place] [address], [place], [place] [address]    Report Status PENDING  Incomplete  Blood  Culture (routine x 2)     Status: None (Preliminary result)   Collection Time: 02/28/19  9:48 AM  Result Value Ref Range Status   Specimen Description BLOOD RIGHT ANTECUBITAL  Final   Special Requests   Final    BOTTLES DRAWN AEROBIC AND ANAEROBIC Blood Culture adequate volume   Culture   Final    NO GROWTH 2 DAYS Performed at [facility], [address], [place], [place] [address]    Report Status PENDING  Incomplete  Urine culture     Status: None   Collection Time: 02/28/19  9:48 AM  Result Value Ref Range Status   Specimen Description   Final    URINE, RANDOM Performed at [facility] [address], [place], [place] [address]    Special Requests   Final    NONE Performed at [facility], [address] [place], [place] [address]    Culture   Final    NO GROWTH Performed at [facility], [place] [address] [place], [place] [address]    Report Status 03/01/2019 FINAL  Final  SARS Coronavirus 2 (CEPHEID- Performed in [place] hospital lab), Hosp Order     Status: None   Collection Time: 02/28/19  9:48 AM  Result Value Ref Range Status   SARS Coronavirus 2 NEGATIVE NEGATIVE Final    Comment: (NOTE) If result is NEGATIVE SARS-CoV-2 target nucleic acids are NOT DETECTED. The SARS-CoV-2 RNA is generally detectable in upper and lower  respiratory specimens during the acute phase of infection. The lowest  concentration of SARS-CoV-2 viral copies this assay can detect is 250  copies / mL. A negative result does not preclude SARS-CoV-2 infection  and should not be used as the sole basis for treatment or other  patient management decisions.  A negative result may occur with  improper specimen collection / handling, submission of specimen other  than nasopharyngeal swab, presence of viral mutation(s) within the  areas targeted by this assay, and inadequate number of viral copies  (<250 copies / mL). A negative result must be combined with  clinical  observations, patient history, and epidemiological information. If result is POSITIVE SARS-CoV-2 target nucleic acids are DETECTED. The SARS-CoV-2 RNA is generally detectable in upper and lower  respiratory specimens dur ing the acute phase of infection.  Positive  results are indicative of active infection with SARS-CoV-2.  Clinical  correlation with patient history and other diagnostic information is  necessary to determine patient infection status.  Positive results do  not rule out bacterial infection  or co-infection with other viruses. If result is PRESUMPTIVE POSTIVE SARS-CoV-2 nucleic acids MAY BE PRESENT.   A presumptive positive result was obtained on the submitted specimen  and confirmed on repeat testing.  While 2019 novel coronavirus  (SARS-CoV-2) nucleic acids may be present in the submitted sample  additional confirmatory testing may be necessary for epidemiological  and / or clinical management purposes  to differentiate between  SARS-CoV-2 and other Sarbecovirus currently known to infect humans.  If clinically indicated additional testing with an alternate test  methodology [phone]) is advised. The SARS-CoV-2 RNA is generally  detectable in upper and lower respiratory sp ecimens during the acute  phase of infection. The expected result is Negative. Fact Sheet for Patients:  StrictlyIdeas.no Fact Sheet for Healthcare Providers: BankingDealers.co.za This test is not yet approved or cleared by the [place] FDA and has been authorized for detection and/or diagnosis of SARS-CoV-2 by FDA under an Emergency Use Authorization (EUA).  This EUA will remain in effect (meaning this test can be used) for the duration of the COVID-19 declaration under Section 564(b)(1) of the Act, 21 U.S.C. section 360bbb-3(b)(1), unless the authorization is terminated or revoked sooner. Performed at [facility], [address] [place], [place] [address]   MRSA PCR Screening     Status: None   Collection Time: 02/28/19  2:48 PM  Result Value Ref Range Status   MRSA by PCR NEGATIVE NEGATIVE Final    Comment:        The GeneXpert MRSA Assay (FDA approved for NASAL specimens only), is one component of a comprehensive MRSA colonization surveillance program. It is not intended to diagnose MRSA infection nor to guide or monitor treatment for MRSA infections. Performed at [facility], [address], [place], [place] [address]      Studies: Dg Chest [facility] 1 View  Result Date: 03/02/2019 CLINICAL DATA:  Respiratory failure. EXAM: PORTABLE CHEST 1 VIEW COMPARISON:  Radiograph of Feb 28, 2019. FINDINGS: The heart size and mediastinal contours are within normal limits. Endotracheal tube has been removed. Nasogastric tube tip remains in proximal stomach. Right internal jugular catheter is unchanged. No pneumothorax or pleural effusion is noted. Both lungs are clear. The visualized skeletal structures are unremarkable. IMPRESSION: Endotracheal tube has been removed. Stable position of right internal jugular catheter and nasogastric tube. No acute cardiopulmonary abnormality seen. Aortic Atherosclerosis (ICD10-I70.0). Electronically Signed   By: Marijo Conception M.D.   On: 03/02/2019 07:36    Scheduled Meds: . insulin aspart  0-9 Units Subcutaneous Q4H  . mouth rinse  15 mL Mouth Rinse BID   Continuous Infusions: . dextrose 5 % with kcl 75 mL/hr at 03/02/19 1641  . heparin 1,050 Units/hr (03/02/19 1641)  . meropenem (MERREM) IV 1 g (03/02/19 1057)    Assessment/Plan:  1. Acute hypoxic respiratory failure.  Continue oxygen supplementation. 2. Acute myocardial infarction.  Conservative management as per cardiology.  Patient on heparin drip 3. Acute metabolic encephalopathy.  Positive for cocaine 4. Acute kidney injury on IV fluids 5. Cocaine abuse 6. Chronic atrial fibrillation.  Patient on heparin  drip.  Unsure if he is compliant with Eliquis. 7. Left first toe gangrenous. 8. Sepsis with gram-negative rods on meropenem 9. History of pancreatic cancer status post Whipple procedure in 2018  Code Status:     Code Status Orders  (From admission, onward)         Start     Ordered   03/02/19 1454  Full code  Continuous     03/02/19 1454        Code Status History    Date Active Date Inactive Code Status Order ID Comments User Context   02/28/2019 1449 03/01/2019 0836 Full Code [id number]  Gorden Harms, MD Inpatient   02/28/2019 1449 02/28/2019 1449 Full Code [id number]  Gorden Harms, MD Inpatient   11/11/2018 0758 11/18/2018 1814 DNR [id number]  Loletha Grayer, MD ED   09/20/2018 1635 09/24/2018 1854 Full Code [id number]  Fritzi Mandes, MD ED   08/03/2018 1020 08/04/2018 1813 Full Code [id number]  Salary, Avel Peace, MD ED   01/09/2018 1025 01/11/2018 1932 Full Code [id number]  Harrie Foreman, MD Inpatient   09/05/2017 1559 09/11/2017 1928 Full Code [id number]  Fritzi Mandes, MD Inpatient   08/24/2017 0238 08/29/2017 2131 Full Code [id number]  Lance Coon, MD Inpatient   08/10/2015 0822 08/11/2015 1947 Full Code [id number]  Harrie Foreman, MD Inpatient     Family Communication: As per critical care team and palliative care team Disposition Plan: To be determined  Consultants:  Critical care specialist  Cardiology  Palliative care  Antibiotics:  Meropenem  Time spent: 25 minutes  [place]

## 2019-03-02 NOTE — Progress Notes (Signed)
Inpatient Diabetes Program Recommendations  AACE/ADA: New Consensus Statement on Inpatient Glycemic Control   Target Ranges:  Prepandial:   less than 140 mg/dL      Peak postprandial:   less than 180 mg/dL (1-2 hours)      Critically ill patients:  140 - 180 mg/dL   Results for TRAVEN, DAVIDS (MRN [id number]) as of 03/02/2019 10:45  Ref. Range 03/01/2019 07:39 03/01/2019 12:01 03/01/2019 16:09 03/01/2019 19:51 03/02/2019 00:04 03/02/2019 04:25 03/02/2019 07:25  Glucose-Capillary Latest Ref Range: 70 - 99 mg/dL 166 (H) 199 (H) 209 (H) 264 (H) 215 (H) 150 (H) 191 (H)   Review of Glycemic Control  Diabetes history: DM2 Outpatient Diabetes medications: Levemir 17 units daily, Novolog 5 units TID with meals, Novolog 0-5 units QHS Current orders for Inpatient glycemic control: Novolog 0-9 units Q4H  Inpatient Diabetes Program Recommendations:   Insulin - Basal: Please consider ordering Levemir 6 units Q24H starting now (based on 57.3 kg x 0.1 units).  Thanks, Barnie Alderman, RN, MSN, CDE Diabetes Coordinator Inpatient Diabetes Program [phone] (Team Pager from 8am to 5pm)

## 2019-03-02 NOTE — Progress Notes (Signed)
SUBJECTIVE: Patient denies any chest pain but is confused   Vitals:   03/02/19 1100 03/02/19 1200 03/02/19 1300 03/02/19 1400  BP:   128/68 121/73  Pulse:    100  Resp: (!) 32 14 17 (!) 26  Temp:    98.2 F (36.8 C)  TempSrc:    Oral  SpO2:    100%  Weight:      Height:        Intake/Output Summary (Last 24 hours) at 03/02/2019 1450 Last data filed at 03/02/2019 1400 Gross per 24 hour  Intake 2435.21 ml  Output 2150 ml  Net 285.21 ml    LABS: Basic Metabolic Panel: Recent Labs    02/28/19 2038  03/01/19 0724 03/02/19 0423  NA 147*   < > 153* 154*  K 3.5   < > 3.2* 3.4*  CL 110   < > 115* 120*  CO2 15*   < > 25 27  GLUCOSE 425*   < > 180* 167*  BUN 83*   < > 86* 46*  CREATININE 2.69*   < > 1.80* 0.86  CALCIUM 7.7*   < > 7.6* 7.9*  MG 2.2  --  2.1  --   PHOS 3.8  --  1.7*  --    < > = values in this interval not displayed.   Liver Function Tests: Recent Labs    02/28/19 0948 03/02/19 0423  AST 23 45*  ALT 14 16  ALKPHOS 40 30*  BILITOT 2.1* 0.6  PROT 5.9* 6.0*  ALBUMIN 2.3* 2.5*   Recent Labs    02/28/19 0948  LIPASE 27   CBC: Recent Labs    02/28/19 0948  03/01/19 0404 03/02/19 0423  WBC 22.4*   < > 16.9* 20.2*  NEUTROABS 18.0*  --   --   --   HGB 7.7*   < > 7.2* 6.8*  HCT 26.9*   < > 22.2* 20.8*  MCV 94.7   < > 81.6 82.2  PLT 417*   < > 331 314   < > = values in this interval not displayed.   Cardiac Enzymes: Recent Labs    02/28/19 1831 03/01/19 0038 03/01/19 0724  TROPONINI 4.13* 10.66* 14.28*   BNP: Invalid input(s): POCBNP D-Dimer: No results for input(s): DDIMER in the last 72 hours. Hemoglobin A1C: No results for input(s): HGBA1C in the last 72 hours. Fasting Lipid Panel: No results for input(s): CHOL, HDL, LDLCALC, TRIG, CHOLHDL, LDLDIRECT in the last 72 hours. Thyroid Function Tests: Recent Labs    03/02/19 0423  TSH 0.387   Anemia Panel: No results for input(s): VITAMINB12, FOLATE, FERRITIN, TIBC, IRON, RETICCTPCT  in the last 72 hours.   PHYSICAL EXAM General: Well developed, well nourished, in no acute distress HEENT:  Normocephalic and atramatic Neck:  No JVD.  Lungs: Clear bilaterally to auscultation and percussion. Heart: HRRR . Normal S1 and S2 without gallops or murmurs.  Abdomen: Bowel sounds are positive, abdomen soft and non-tender  Msk:  Back normal, normal gait. Normal strength and tone for age. Extremities: No clubbing, cyanosis or edema.   Neuro: Alert and oriented X 3. Psych:  Good affect, responds appropriately  TELEMETRY: Sinus tachycardia  ASSESSMENT AND PLAN: Inferior and posterior extension STEMI with elevated troponin but given the patient is palliative care and cancer of the pancreas prognosis is poor.  Thus conservative treatment is recommended.  Active Problems:   Respiratory failure ([place])    Richard David, MD, [facility] 03/02/2019  2:50 PM

## 2019-03-02 NOTE — Progress Notes (Signed)
PHARMACY - PHYSICIAN COMMUNICATION CRITICAL VALUE ALERT - BLOOD CULTURE IDENTIFICATION (BCID)  Richard [name] is an 63 y.o. male who presented to [facility] on [facility]/28/2019 with a chief complaint of AMS, hypothermia, DKA agonal respiratory effort.  Assessment:  Blood culture: 1 set with [place]  Name of physician (or Provider) Contacted: [place]  Current antibiotics: none  Changes to prescribed antibiotics recommended:  Will add Meropenem 1g IV q8h and await further results.  Paulina Fusi, PharmD, BCPS 03/02/2019 10:39 AM

## 2019-03-02 NOTE — Consult Note (Addendum)
Consultation Note Date: 03/02/2019   Patient Name: Richard [name]  DOB: [birth year]  MRN: [id number]  Age / Sex: 63 y.o., male  PCP: [facility], [place] Referring Physician: Loletha Grayer, MD  Reason for Consultation: Establishing goals of care  HPI/Patient Profile: Richard [name]  is a 63 y.o. male with a known history per below which includes A. Fib, heart failure, CAD, hx pancreatic cancer s/p resection, diabetes,and  hypertension, presents to the emergency room via EMS for acute unresponsiveness. He was treated for DKA. Per  EMR note and current UDS, cocaine positive, and noted to have NSTEMI.   Clinical Assessment and Goals of Care: Patient is resting in bed with eyes closed. He awakens to voice. Introduced self and role, patient turned over and repositioned himself says "oh Lord!" multiple times. He says "yes" when asked if he has pain, but does not state where. He does not indicate answers for questions of family or surrogate decision maker, but does nod that he has a child and goes back to sleep.    Called emergency contact [name] and VM was left.  Addendum: Significant other called back. She states he has not been taking his medications the way he should. She states he has been depressed and feels he has been of no use to her. She states he uses street drugs for pain management. Began to discuss [place]. She states they have been together 20 years and have a daughter together that is 57 years old currently. She states further that the patient is still married to a woman in [place]. She will attempt to get her phone number as wife would legally be his surrogate decision maker.      SUMMARY OF RECOMMENDATIONS   Will call [name] tomorrow for wife's phone number if patient is unable to make decisions.  Prognosis:   Poor overall.       Primary Diagnoses: Present on Admission: . Respiratory  failure ([place])   I have reviewed the medical record, interviewed the patient and family, and examined the patient. The following aspects are pertinent.  Past Medical History:  Diagnosis Date  . Atrial fibrillation ([place])    per patient  . Cancer ([place])   . CHF (congestive heart failure) ([place])   . Chronic back pain   . Chronic leg pain   . Coronary artery disease   . Diabetes mellitus without complication ([place])   . Hypertension   . Neuropathy    Social History   Socioeconomic History  . Marital status: Single    Spouse name: Not on file  . Number of children: Not on file  . Years of education: Not on file  . Highest education level: Not on file  Occupational History  . Not on file  Social Needs  . Financial resource strain: Not on file  . Food insecurity:    Worry: Not on file    Inability: Not on file  . Transportation needs:    Medical: Not on file    Non-medical: Not  on file  Tobacco Use  . Smoking status: Current Every Day Smoker    Packs/day: 0.50  . Smokeless tobacco: Never Used  Substance and Sexual Activity  . Alcohol use: No    Alcohol/week: 0.0 standard drinks  . 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 Cocaine used over a year ago per pt.  . Sexual activity: Not on file  Lifestyle  . Physical activity:    Days per week: Not on file    Minutes per session: Not on file  . Stress: Not on file  Relationships  . Social connections:    Talks on phone: Not on file    Gets together: Not on file    Attends religious service: Not on file    Active member of club or organization: Not on file    Attends meetings of clubs or organizations: Not on file    Relationship status: Not on file  Other Topics Concern  . Not on file  Social History Narrative  . Not on file   Family History  Problem Relation Age of Onset  . CAD Mother   . Diabetes Mellitus II Mother   . Diabetes Mellitus II Father    Scheduled Meds: . insulin  aspart  0-9 Units Subcutaneous Q4H  . mouth rinse  15 mL Mouth Rinse BID   Continuous Infusions: . heparin 1,050 Units/hr (03/02/19 1200)  . meropenem (MERREM) IV 1 g (03/02/19 1057)   PRN Meds:.acetaminophen, etomidate, HYDROcodone-acetaminophen, ondansetron (ZOFRAN) IV Medications Prior to Admission:  Prior to Admission medications   Medication Sig Start Date End Date Taking? Authorizing Provider  amitriptyline (ELAVIL) 50 MG tablet Take 1 tablet (50 mg total) by mouth at bedtime. 08/04/18  Yes Salary, Avel Peace, MD  apixaban (ELIQUIS) 5 MG TABS tablet Take 1 tablet (5 mg total) by mouth 2 (two) times daily. 09/23/18  Yes Gouru, Illene Silver, MD  atorvastatin (LIPITOR) 40 MG tablet Take 1 tablet (40 mg total) by mouth every evening. 11/18/18  Yes Dustin Flock, MD  gabapentin (NEURONTIN) 100 MG capsule Take 1 capsule (100 mg total) by mouth 3 (three) times daily. 08/04/18  Yes Salary, Montell D, MD  insulin aspart (NOVOLOG) 100 UNIT/ML injection Inject 0-5 Units into the skin at bedtime. CBG < 70: implement hypoglycemia protocol CBG 70 - 120: 0 units CBG 121 - 150: 0 units CBG 151 - 200: 0 units CBG 201 - 250: 2 units CBG 251 - 300: 3 units CBG 301 - 350: 4 units CBG 351 - 400: 5 units CBG > 400: call MD 11/18/18  Yes Dustin Flock, MD  LEVEMIR FLEXTOUCH 100 UNIT/ML Pen Inject 17 Units into the skin daily. 09/25/18  Yes [provider]  Multiple Vitamin (MULTIVITAMIN WITH MINERALS) TABS tablet Take 1 tablet by mouth daily. 01/12/18  Yes Gouru, Aruna, MD  NOVOLOG FLEXPEN 100 UNIT/ML FlexPen Inject 5 Units into the skin 3 (three) times daily before meals. 09/25/18  Yes [provider]  traZODone (DESYREL) 50 MG tablet Take 1 tablet (50 mg total) by mouth at bedtime as needed for sleep. 08/04/18  Yes Salary, Avel Peace, MD  ACCU-CHEK AVIVA PLUS test strip U ONCE TO BID UTD 09/23/18   Nicholes Mango, MD  ACCU-CHEK SOFTCLIX LANCETS lancets TEST 1-2 XD 09/23/18   Gouru, Illene Silver, MD   acetaminophen (TYLENOL) 325 MG tablet Take 1 tablet (325 mg total) by mouth every 6 (six) hours as needed for mild  pain (or Fever >/= 101). 01/11/18   Gouru, Aruna, MD  aspirin 81 MG chewable tablet Chew 1 tablet (81 mg total) by mouth daily. Patient not taking: Reported on 11/11/2018 01/12/18   Nicholes Mango, MD  Blood Glucose Monitoring Suppl (ACCU-CHEK AVIVA PLUS) w/Device KIT U TO TEST ONCE TO BID 09/23/18   Gouru, Illene Silver, MD  HYDROcodone-acetaminophen (NORCO/VICODIN) 5-325 MG tablet Take 1-2 tablets by mouth every 6 (six) hours as needed for moderate pain. Patient not taking: Reported on 02/28/2019 11/18/18   Dustin Flock, MD  Needles & Syringes MISC Use as directed 09/23/18   Nicholes Mango, MD   No Known Allergies Review of Systems  Constitutional:       Says oh Lord! Repeatedly while repositioning in bed. Returned to sleep after repositioning.      Physical Exam Pulmonary:     Effort: Pulmonary effort is normal.  Skin:    General: Skin is warm and dry.     Vital Signs: BP 120/82   Pulse (!) 101   Temp 98.8 F (37.1 C) (Oral)   Resp 14   Ht 5' 5"  (1.651 m)   Wt 57.3 kg   SpO2 98%   BMI 21.02 kg/m  Pain Scale: CPOT       SpO2: SpO2: 98 % O2 Device:SpO2: 98 % O2 Flow Rate: .   IO: Intake/output summary:   Intake/Output Summary (Last 24 hours) at 03/02/2019 1323 Last data filed at 03/02/2019 1200 Gross per 24 hour  Intake 2414.33 ml  Output 2150 ml  Net 264.33 ml    LBM: Last BM Date: (UTA-pt confused) Baseline Weight: Weight: 54 kg Most recent weight: Weight: 57.3 kg     Palliative Assessment/Data:     Time In: 1:00 Time Out: 1:30 then 1:50-2:30 Time Total: 70 min Greater than 50%  of this time was spent counseling and coordinating care related to the above assessment and plan.  Signed by: Asencion Gowda, NP   Please contact Palliative Medicine Team phone at [phone] for questions and concerns.  For individual provider: See Shea Evans

## 2019-03-02 NOTE — TOC Initial Note (Addendum)
Transition of Care [facility]) - Initial/Assessment Note    Patient Details  Name: Richard [name] MRN: [id number] Date of Birth: [birth year]  [facility]) CM/SW Contact:    Richard Maudlin, RN Phone Number: 03/02/2019, 11:03 AM  Clinical Narrative:    [facility] team consulted as patient is high risk for readmissions and patient may have disposition needs. Patient currently responsive but his orientation fluctuates but for the most part was able to answer assessment questions. Voicemail left with significant other to complete assessment. Patient lives at home with his girlfriend Richard [name] [phone] and his daughter. Patient was reported to be independent with activities of daily living at baseline. Uses only a walker for DME, last admission PT worked with patient and had no follow up recommendations however given the situation it would be beneficial to seek another PT eval once patient is stable. Patient was admitted after hypotensive event and noted to be in DKA. Patient has had several admission related to DKA. Diabetes educator is following. Patient reports having a functional glucometer, and tells me he is adequately able to maintain his blood sugars. Patient uses Holiday representative in [place] and is able to obtain medications. Patient on chronic eliquis. PCP is with Richard [name].  During rounds it was determined that palliative care will be consulted. Patients BC were noted to be positive so he will continue to receive IV abx. And counseling has been done as patient was positive for cocaine on admission.    Do to several existing medical barriers [facility] team is not able to establish the next steps of care but we will continue to follow for dispostion particularly following along with PT recommendations or possible palliative services that will be needed.        Barriers to Discharge: Continued Medical Work up   Patient Goals and CMS Choice        Expected Discharge Plan and Services    In-house Referral: Hospice / Palliative Care Discharge Planning Services: CM Consult   Living arrangements for the past 2 months: Single Family Home                                      Prior Living Arrangements/Services Living arrangements for the past 2 months: Single Family Home Lives with:: Significant Other, Adult Children Patient language and need for interpreter reviewed:: No Do you feel safe going back to the place where you live?: Yes      Need for Family Participation in Patient Care: Yes (Comment)(pt with fluctuating mental status)   Current home services: DME Criminal Activity/Legal Involvement Pertinent to Current Situation/Hospitalization: No - Comment as needed  Activities of Daily Living      Permission Sought/Granted                  Emotional Assessment Appearance:: Appears older than stated age Attitude/Demeanor/Rapport: Gracious, Inconsistent Affect (typically observed): Calm, Apprehensive Orientation: : Oriented to Self, Oriented to Place, Fluctuating Orientation (Suspected and/or reported [place])      Admission diagnosis:  Acute respiratory failure, unspecified whether with hypoxia or hypercapnia ([place]) [J96.00] Diabetic ketoacidosis with coma associated with type 2 diabetes mellitus ([place]) [E11.11] Patient Active Problem List   Diagnosis Date Noted  . Respiratory failure ([place]) 02/28/2019  . Diabetes with hyperosmolar coma ([place]) 11/11/2018  . A-fib ([place]) 09/20/2018  . Hyperglycemia 08/03/2018  . Protein-calorie malnutrition, severe 01/10/2018  . Sepsis ([place])  01/09/2018  . DKA, type 2 ([place]) 09/05/2017  . Ileus ([place]) 08/24/2017  . Diabetes ([place]) 08/24/2017  . Atrial flutter ([place]) 08/24/2017  . CAD (coronary artery disease) 08/24/2017  . HTN (hypertension) 08/24/2017  . Atrial fibrillation ([place]) 08/24/2017  . Tobacco use disorder, severe, dependence 07/25/2017  . Wound dehiscence, surgical, initial encounter 06/24/2017  . Acute  hematogenous osteomyelitis of right foot ([place]) 06/20/2017  . Adenocarcinoma of pancreas ([place]) 06/20/2017  . Esophagitis 06/20/2017  . Cellulitis of right lower extremity 06/10/2017  . Hypertension, poor control 06/10/2017  . Insulin dependent type 2 diabetes mellitus ([place]) 06/10/2017  . Chest pain 08/10/2015   PCP:  [facility], [place]:   [facility] [place], [place] - [address] [place] [address] Phone: [phone] Fax: [id number] [place] [address] Phone: [phone] Fax: [phone]     Social Determinants of Health (SDOH) Interventions    Readmission Risk Interventions Readmission Risk Prevention Plan 03/02/2019  Transportation Screening Complete  HRI or Home Care Consult Complete  Palliative Care Screening Complete  Medication Review (RN Care Manager) Complete  Some recent data might be hidden

## 2019-03-02 NOTE — Progress Notes (Signed)
PCCM PROGRESS NOTE  PT PROFILE/INITIAL PRESENTATION: 63 y.o. male with hx of pancreatic cancer (resected 2018 and followed by [facility] oncology) DM, CAD (Cardiologist: Humphrey Rolls), PVD, severe protein-calorie malnutrition adm via EMS>ED with AMS, hypothermia, DKA agonal respiratory efforts. Intubated immediately upon arrival to ED  MAJOR EVENTS/TEST RESULTS: 05/09 CT head: NAD  INDWELLING DEVICES: ETT 05/09 >> 05/10 R IJ CVL 05/09 >>   MICRO DATA: SARS-CoV-2 5/9 >> NEG MRSA PCR 5/9 >> NEG Urine 5/9 >> NEG Blood 5/9 >>1/2 GNRs >>   ANTIMICROBIALS:  Vanc 05/09 >> 5/10 Cefepime 05/09 >>5/10   Meropenem 05/11 >>   SUBJ:  Remains encephalopathic.  Tolerating extubation from a respiratory point of view.  Able to tell me that he is in the hospital.  Otherwise poorly oriented.  Vitals:   03/02/19 1200 03/02/19 1300 03/02/19 1400 03/02/19 1500  BP:  128/68 121/73 118/73  Pulse:   100   Resp: 14 17 (!) 26 17  Temp:   98.2 F (36.8 C)   TempSrc:   Oral   SpO2:   100%   Weight:      Height:      Room air      05/10 0701 - 05/11 0700 In: 1901.8 [I.V.:1901.8] Out: 2150 [Urine:2150]   Intake/Output Summary (Last 24 hours) at 03/02/2019 1532 Last data filed at 03/02/2019 1517 Gross per 24 hour  Intake 2548.76 ml  Output 2150 ml  Net 398.76 ml     EXAM:  Gen: Calm, lethargic, no overt distress HEENT: NCAT, sclerae white Neck: No JVD noted Lungs: Clear throughout Cardiovascular: Regular, no M Abdomen: Soft, NT, NABS Ext: R transmetatarsal amputation, gangrenous (dry) L great toe, no LE edema  Neuro: Cognition poor, otherwise no neurologic deficits Skin: Limited exam, no lesions noted  DATA:   BMP Latest Ref Rng & Units 03/02/2019 03/01/2019 03/01/2019  Glucose 70 - 99 mg/dL 167(H) 180(H) 111(H)  BUN 8 - 23 mg/dL 46(H) 86(H) 86(H)  Creatinine 0.61 - 1.24 mg/dL 0.86 1.80(H) 1.88(H)  Sodium 135 - 145 mmol/L 154(H) 153(H) 154(H)  Potassium 3.5 - 5.1 mmol/L 3.4(L) 3.2(L) 3.2(L)   Chloride 98 - 111 mmol/L 120(H) 115(H) 114(H)  CO2 22 - 32 mmol/L 27 25 30   Calcium 8.9 - 10.3 mg/dL 7.9(L) 7.6(L) 7.7(L)    CBC Latest Ref Rng & Units 03/02/2019 03/01/2019 02/28/2019  WBC 4.0 - 10.5 K/uL 20.2(H) 16.9(H) 18.8(H)  Hemoglobin 13.0 - 17.0 g/dL 6.8(L) 7.2(L) 7.9(L)  Hematocrit 39.0 - 52.0 % 20.8(L) 22.2(L) 25.5(L)  Platelets 150 - 400 K/uL 314 331 370    Hepatic Function Latest Ref Rng & Units 03/02/2019 02/28/2019 11/11/2018  Total Protein 6.5 - 8.1 g/dL 6.0(L) 5.9(L) 7.4  Albumin 3.5 - 5.0 g/dL 2.5(L) 2.3(L) 3.8  AST 15 - 41 U/L 45(H) 23 17  ALT 0 - 44 U/L 16 14 13   Alk Phosphatase 38 - 126 U/L 30(L) 40 31(L)  Total Bilirubin 0.3 - 1.2 mg/dL 0.6 2.1(H) 0.7  Bilirubin, Direct 0.0 - 0.2 mg/dL - - -     Cardiac Panel (last 3 results) Recent Labs    02/28/19 1831 03/01/19 0038 03/01/19 0724  TROPONINI 4.13* 10.66* 14.28*    BNP (last 3 results) No results for input(s): BNP in the last 8760 hours.  ProBNP (last 3 results) No results for input(s): PROBNP in the last 8760 hours.    CXR: No acute cardiopulmonary findings  I have personally reviewed all chest radiographs reported above including CXRs and CT chest  unless otherwise indicated  IMPRESSION:   VDRF, resolved  H/O CAD with elevated troponin I  NSTEMI PAF > NSR presently Hypotension, resolved Severe PVD  Gangrenous L great toe - will need eval prior to DC from hospital AKI, resolving Severe metabolic acidosis, resolved Hypernatremia Hypokalemia Hyperosmolality  Severe protein-calorie malnutrition History of pancreatic cancer, s/p resection 2018  Last [facility]9 [facility]: 66 on 11/22/17  S/P splenectomy 2018 + Blood culture (1 of 2 GNR's) Acute on chronic anemia without overt blood loss Acute encephalopathy -likely TME Urine drug screen + for cocaine Poor chronic health and guarded prognosis  PLAN:  Continue to monitor in SDU due to acute encephalopathy Supplemental oxygen as needed Continue  hemodynamic monitoring Cardiology consultation appreciated Conservative management Per cardiology, continue full dose UFH for now Monitor BMET intermittently Monitor I/Os Correct electrolytes as indicated Continue D5W + KCl  CA19-9 ordered 05/10 - result pending. If rising, consider CT abd Monitor temp, WBC count Micro and abx as above  DVT px: Full dose heparin Monitor CBC intermittently Transfuse per usual guidelines Minimize all sedatives. Can consider dexmedetomidine if needed Palliative care consultation appreciated   Merton Border, MD PCCM service Mobile [phone] Pager [phone] 03/02/2019 3:32 PM

## 2019-03-03 LAB — BASIC METABOLIC PANEL
Anion gap: 7 (ref 5–15)
BUN: 24 mg/dL — ABNORMAL HIGH (ref 8–23)
CO2: 29 mmol/L (ref 22–32)
Calcium: 8 mg/dL — ABNORMAL LOW (ref 8.9–10.3)
Chloride: 118 mmol/L — ABNORMAL HIGH (ref 98–111)
Creatinine, Ser: 0.74 mg/dL (ref 0.61–1.24)
GFR calc Af Amer: >60 mL/min (ref >60)
GFR calc non Af Amer: >60 mL/min (ref >60)
Glucose, Bld: 222 mg/dL — ABNORMAL HIGH (ref 70–99)
Potassium: 3.7 mmol/L (ref 3.5–5.1)
Sodium: 154 mmol/L — ABNORMAL HIGH (ref 135–145)

## 2019-03-03 LAB — GLUCOSE, CAPILLARY
Glucose-Capillary: 215 mg/dL — ABNORMAL HIGH (ref 70–99)
Glucose-Capillary: 197 mg/dL — ABNORMAL HIGH (ref 70–99)
Glucose-Capillary: 210 mg/dL — ABNORMAL HIGH (ref 70–99)
Glucose-Capillary: 139 mg/dL — ABNORMAL HIGH (ref 70–99)
Glucose-Capillary: 164 mg/dL — ABNORMAL HIGH (ref 70–99)

## 2019-03-03 LAB — CBC
HCT: 20.5 % — ABNORMAL LOW (ref 39.0–52.0)
Hemoglobin: 6.4 g/dL — ABNORMAL LOW (ref 13.0–17.0)
MCH: 26.9 pg (ref 26.0–34.0)
MCHC: 31.2 g/dL (ref 30.0–36.0)
MCV: 86.1 fL (ref 80.0–100.0)
Platelets: 310 10*3/uL (ref 150–400)
RBC: 2.38 MIL/uL — ABNORMAL LOW (ref 4.22–5.81)
RDW: 16.7 % — ABNORMAL HIGH (ref 11.5–15.5)
WBC: 17.7 10*3/uL — ABNORMAL HIGH (ref 4.0–10.5)
nRBC: 0.9 % — ABNORMAL HIGH (ref 0.0–0.2)

## 2019-03-03 LAB — HEPARIN LEVEL (UNFRACTIONATED): Heparin Unfractionated: 0.39 IU/mL (ref 0.30–0.70)

## 2019-03-03 LAB — CULTURE, BLOOD (ROUTINE X 2)

## 2019-03-03 LAB — CA 19-9 (SERIAL): CA 19-9: 61 U/mL — ABNORMAL HIGH (ref 0–35)

## 2019-03-03 LAB — PREPARE RBC (CROSSMATCH)

## 2019-03-03 LAB — HEMOGLOBIN AND HEMATOCRIT, BLOOD
HCT: 22.9 % — ABNORMAL LOW (ref 39.0–52.0)
Hemoglobin: 7.3 g/dL — ABNORMAL LOW (ref 13.0–17.0)

## 2019-03-03 LAB — PROCALCITONIN: Procalcitonin: 0.24 ng/mL

## 2019-03-03 MED ORDER — SODIUM CHLORIDE 0.9 % IV SOLN
2.0000 g | INTRAVENOUS | Status: AC
  Administered 2019-03-03 – 2019-03-07 (×5): 2 g via INTRAVENOUS
  Filled 2019-03-03 (×3): qty 2
  Filled 2019-03-03: qty 20
  Filled 2019-03-03: qty 2
  Filled 2019-03-03: qty 20

## 2019-03-03 MED ORDER — SODIUM CHLORIDE 0.9% IV SOLUTION
Freq: Once | INTRAVENOUS | Status: AC
  Administered 2019-03-03: 11:00:00 via INTRAVENOUS

## 2019-03-03 MED ORDER — ENOXAPARIN SODIUM 40 MG/0.4ML ~~LOC~~ SOLN
40.0000 mg | SUBCUTANEOUS | Status: DC
  Administered 2019-03-03 – 2019-03-17 (×11): 40 mg via SUBCUTANEOUS
  Filled 2019-03-03 (×12): qty 0.4

## 2019-03-03 MED ORDER — INSULIN GLARGINE 100 UNIT/ML ~~LOC~~ SOLN
5.0000 [IU] | Freq: Every day | SUBCUTANEOUS | Status: DC
  Administered 2019-03-03 – 2019-03-04 (×2): 5 [IU] via SUBCUTANEOUS
  Filled 2019-03-03 (×4): qty 0.05

## 2019-03-03 MED ORDER — ASPIRIN 81 MG PO CHEW
81.0000 mg | CHEWABLE_TABLET | Freq: Every day | ORAL | Status: DC
  Administered 2019-03-04 – 2019-03-19 (×13): 81 mg via ORAL
  Filled 2019-03-03 (×14): qty 1

## 2019-03-03 MED ORDER — ENSURE ENLIVE PO LIQD
237.0000 mL | Freq: Three times a day (TID) | ORAL | Status: DC
  Administered 2019-03-03 – 2019-03-10 (×14): 237 mL via ORAL

## 2019-03-03 MED ORDER — SODIUM CHLORIDE 0.9 % IV SOLN
1.0000 g | INTRAVENOUS | Status: DC
  Filled 2019-03-03: qty 10

## 2019-03-03 MED ORDER — INSULIN ASPART 100 UNIT/ML ~~LOC~~ SOLN
0.0000 [IU] | Freq: Three times a day (TID) | SUBCUTANEOUS | Status: DC
  Administered 2019-03-03: 5 [IU] via SUBCUTANEOUS
  Administered 2019-03-03: 19:00:00 2 [IU] via SUBCUTANEOUS
  Administered 2019-03-04: 15 [IU] via SUBCUTANEOUS
  Administered 2019-03-04: 3 [IU] via SUBCUTANEOUS
  Filled 2019-03-03 (×4): qty 1

## 2019-03-03 NOTE — Progress Notes (Signed)
Daily Progress Note   Patient Name: Richard [name]       Date: 03/03/2019 DOB: 1956/01/02  Age: 63 y.o. MRN#: [id number] Attending Physician: Loletha Grayer, MD Primary Care Physician: [name], [place] Date: 02/28/2019  Reason for Consultation/Follow-up: Establishing goals of care  Subjective: Patient is resting in bed. He can answer some questions appropriately.  Feeding tube removed. He is improving. Hopeful he will improve to participate in [place] conversation and will need HPOA papers as soon as he is able.   Spoke with significant other Richard [name]. She states he has just given up on life and does not believe he would want to ever be intubated or to have CPR.  She does not know how to reach his estranged legal wife Richard [name]. She lives in [place]. In addition to the daughter he has with Richard [name], he also has a daughter and son by a woman that is not the wife. She states the son has been estranged and she does not know how to reach him. The daughter's name is Richard [name] [phone]. If unable to reach the  wife, the 3 children over the age of 68 would be the next of kin decision makers. She states she has not been able to discuss decisions with her daughter.   Attempted unsuccessfully to reach [name] at number listed.   Length of Stay: 3  Current Medications: Scheduled Meds:  . [START ON 03/04/2019] aspirin  81 mg Oral Daily  . enoxaparin (LOVENOX) injection  40 mg Subcutaneous Q24H  . feeding supplement (ENSURE ENLIVE)  237 mL Oral TID BM  . insulin aspart  0-15 Units Subcutaneous TID WC  . insulin glargine  5 Units Subcutaneous Daily  . mouth rinse  15 mL Mouth Rinse BID    Continuous Infusions: . cefTRIAXone (ROCEPHIN)  IV    . dextrose 5 %  with kcl 75 mL/hr at 03/03/19 0744    PRN Meds: acetaminophen, etomidate, ondansetron (ZOFRAN) IV  Physical Exam Skin:    General: Skin is warm and dry.  Neurological:     Mental Status: He is alert.             Vital Signs: BP 97/65   Pulse 97   Temp 98.7 F (37.1 C) (Oral)   Resp (!) 34  Ht 5\' 5"  (1.651 m)   Wt 57.3 kg   SpO2 100%   BMI 21.02 kg/m  SpO2: SpO2: 100 % O2 Device: O2 Device: Nasal Cannula O2 Flow Rate:    Intake/output summary:   Intake/Output Summary (Last 24 hours) at 03/03/2019 1253 Last data filed at 03/03/2019 0744 Gross per 24 hour  Intake 1724.95 ml  Output 1150 ml  Net 574.95 ml   LBM: Last BM Date: (UTA-pt confused) Baseline Weight: Weight: 54 kg Most recent weight: Weight: 57.3 kg       Palliative Assessment/Data:    Flowsheet Rows     Most Recent Value  Intake Tab  Referral Department  Critical care  Unit at Time of Referral  ICU  Palliative Care Primary Diagnosis  Cancer  Date Notified  02/28/19  Palliative Care Type  New Palliative care  Reason for referral  Clarify Goals of Care  Date of Admission  02/28/19  Date first seen by Palliative Care  03/02/19  # of days Palliative referral response time  2 Day(s)  # of days IP prior to Palliative referral  0  Clinical Assessment  Psychosocial & Spiritual Assessment  Palliative Care Outcomes      Patient Active Problem List   Diagnosis Date Noted  . Respiratory failure ([place]) 02/28/2019  . Diabetes with hyperosmolar coma ([place]) 11/11/2018  . A-fib ([place]) 09/20/2018  . Hyperglycemia 08/03/2018  . Protein-calorie malnutrition, severe 01/10/2018  . Sepsis ([place]) 01/09/2018  . DKA, type 2 ([place]) 09/05/2017  . Ileus ([place]) 08/24/2017  . Diabetes ([place]) 08/24/2017  . Atrial flutter ([place]) 08/24/2017  . CAD (coronary artery disease) 08/24/2017  . HTN (hypertension) 08/24/2017  . Atrial fibrillation ([place]) 08/24/2017  . Tobacco use disorder, severe, dependence 07/25/2017  . Wound  dehiscence, surgical, initial encounter 06/24/2017  . Acute hematogenous osteomyelitis of right foot ([place]) 06/20/2017  . Adenocarcinoma of pancreas ([place]) 06/20/2017  . Esophagitis 06/20/2017  . Cellulitis of right lower extremity 06/10/2017  . Hypertension, poor control 06/10/2017  . Insulin dependent type 2 diabetes mellitus ([place]) 06/10/2017  . Chest pain 08/10/2015    Palliative Care Assessment & Plan    Recommendations/Plan:  Will need HPOA papers completed when able.   Recommend palliative at D/C.     Code Status:    Code Status Orders  (From admission, onward)         Start     Ordered   03/02/19 1454  Full code  Continuous     03/02/19 1454        Code Status History    Date Active Date Inactive Code Status Order ID Comments User Context   02/28/2019 1449 03/01/2019 0836 Full Code [id number]  Gorden Harms, MD Inpatient   02/28/2019 1449 02/28/2019 1449 Full Code [id number]  Gorden Harms, MD Inpatient   11/11/2018 0758 11/18/2018 1814 DNR [id number]  Loletha Grayer, MD ED   09/20/2018 1635 09/24/2018 1854 Full Code [id number]  Fritzi Mandes, MD ED   08/03/2018 1020 08/04/2018 1813 Full Code [id number]  Salary, Avel Peace, MD ED   01/09/2018 1025 01/11/2018 1932 Full Code [id number]  Harrie Foreman, MD Inpatient   09/05/2017 1559 09/11/2017 1928 Full Code [id number]  Fritzi Mandes, MD Inpatient   08/24/2017 0238 08/29/2017 2131 Full Code [id number]  Lance Coon, MD Inpatient   08/10/2015 0822 08/11/2015 1947 Full Code [id number]  Harrie Foreman, MD Inpatient       Prognosis:   Unable to determine  Discharge Planning:  To Be Determined  Care plan was discussed with CCM  Thank you for allowing the Palliative Medicine Team to assist in the care of this patient.   Time In: 12:30 Time Out: 1:15 Total Time 50 min Prolonged Time Billed  no       Greater than 50%  of this time was spent counseling and coordinating care related to the above assessment and plan.   Asencion Gowda, NP  Please contact Palliative Medicine Team phone at [phone] for questions and concerns.

## 2019-03-03 NOTE — Progress Notes (Signed)
Patient ID: Richard [name], male   DOB: [birth year], 63 y.o.   MRN: [id number]  [facility] PROGRESS NOTE  Richard [name] HGD:[id number] DOB: November 18, 1955 DOA: 02/28/2019 PCP: [facility]  HPI/Subjective: Patient more alert today.  Answering more questions today than yesterday.  Feels hungry.  No abdominal pain.  Objective: Vitals:   03/03/19 1230 03/03/19 1243  BP: 94/61 97/65  Pulse: 97   Resp: (!) 34   Temp:  98.7 F (37.1 C)  SpO2: 100%     Filed Weights   02/28/19 1040 02/28/19 1443 03/01/19 0416  Weight: 54 kg 55 kg 57.3 kg    ROS: Review of Systems  Unable to perform ROS: Acuity of condition  Respiratory: Negative for shortness of breath.   Cardiovascular: Negative for chest pain.  Gastrointestinal: Negative for abdominal pain.   Exam: Physical Exam  HENT:  Nose: No mucosal edema.  Mouth/Throat: No oropharyngeal exudate or posterior oropharyngeal edema.  Eyes: Pupils are equal, round, and reactive to light. Conjunctivae, EOM and lids are normal.  Neck: No JVD present. Carotid bruit is not present. No edema present. No thyroid mass and no thyromegaly present.  Cardiovascular: S1 normal and S2 normal. Exam reveals no gallop.  No murmur heard. Pulses:      Dorsalis pedis pulses are 2+ on the right side and 2+ on the left side.  Respiratory: No respiratory distress. He has no wheezes. He has no rhonchi. He has no rales.  GI: Soft. Bowel sounds are normal. There is no abdominal tenderness.  Musculoskeletal:     Right ankle: He exhibits no swelling.     Left ankle: He exhibits no swelling.  Lymphadenopathy:    He has no cervical adenopathy.  Neurological: He is alert.  Moved his arms on his own.  Skin: Skin is warm. Nails show no clubbing.  Left first toe gangrenous.  Prior amputation of the right trans-met.  Psychiatric: His affect is blunt.      Data Reviewed: Basic Metabolic Panel: Recent Labs  Lab 02/28/19 2038 03/01/19 0005  03/01/19 0404 03/01/19 0724 03/02/19 0423 03/03/19 0521  NA 147* 154* 154* 153* 154* 154*  K 3.5 2.6* 3.2* 3.2* 3.4* 3.7  CL 110 110 114* 115* 120* 118*  CO2 15* _0 GLUCOSE 425* 224* 111* 180* 167* 222*  BUN 83* 86* 86* 86* 46* 24*  CREATININE 2.69* 2.33* 1.88* 1.80* 0.86 0.74  CALCIUM 7.7* 7.5* 7.7* 7.6* 7.9* 8.0*  MG 2.2  --   --  2.1  --   --   PHOS 3.8  --   --  1.7*  --   --    Liver Function Tests: Recent Labs  Lab 02/28/19 0948 03/02/19 0423  AST 23 45*  ALT 14 16  ALKPHOS 40 30*  BILITOT 2.1* 0.6  PROT 5.9* 6.0*  ALBUMIN 2.3* 2.5*   Recent Labs  Lab 02/28/19 0948  LIPASE 27   CBC: Recent Labs  Lab 02/28/19 0948 02/28/19 2038 03/01/19 0404 03/02/19 0423 03/03/19 0521  WBC 22.4* 18.8* 16.9* 20.2* 17.7*  NEUTROABS 18.0*  --   --   --   --   HGB 7.7* 7.9* 7.2* 6.8* 6.4*  HCT 26.9* 25.5* 22.2* 20.8* 20.5*  MCV 94.7 86.1 81.6 82.2 86.1  PLT 417* 370 331 314 310   Cardiac Enzymes: Recent Labs  Lab 02/28/19 0948 02/28/19 1550 02/28/19 1831 03/01/19 0038 03/01/19 0724  TROPONINI 2.31* 2.71* 4.13* 10.66* 14.28*  CBG: Recent Labs  Lab 03/02/19 2005 03/02/19 2338 03/03/19 0338 03/03/19 0730 03/03/19 1133  GLUCAP 213* 222* 215* 197* 210*    Recent Results (from the past 240 hour(s))  Blood Culture (routine x 2)     Status: Abnormal   Collection Time: 02/28/19  9:48 AM  Result Value Ref Range Status   Specimen Description   Final    BLOOD RIGHT ANTECUBITAL Performed at [facility], [address], [place], [place] [address]    Special Requests   Final    BOTTLES DRAWN AEROBIC AND ANAEROBIC Blood Culture results may not be optimal due to an excessive volume of blood received in culture bottles Performed at [facility], [address] [place], [place] [address]    Culture (A)  Final    PROTEUS VULGARIS CRITICAL RESULT CALLED TO, READ BACK BY AND VERIFIED WITH:  PHARMD ASAJAH D [id number] FCP GROWTH  RECOVERED FROM AEROBIC BOTTLE ONLY. Performed at [facility], [place] [address], [place], [place] [address]    Report Status 03/03/2019 FINAL  Final   Organism ID, Bacteria PROTEUS VULGARIS  Final      Susceptibility   Proteus vulgaris - MIC*    AMPICILLIN >=32 RESISTANT Resistant     CEFAZOLIN >=64 RESISTANT Resistant     CEFEPIME <=1 SENSITIVE Sensitive     CEFTAZIDIME <=1 SENSITIVE Sensitive     CEFTRIAXONE <=1 SENSITIVE Sensitive     CIPROFLOXACIN <=0.25 SENSITIVE Sensitive     GENTAMICIN <=1 SENSITIVE Sensitive     IMIPENEM 2 SENSITIVE Sensitive     TRIMETH/SULFA <=20 SENSITIVE Sensitive     AMPICILLIN/SULBACTAM 4 SENSITIVE Sensitive     PIP/TAZO <=4 SENSITIVE Sensitive     * PROTEUS VULGARIS  Blood Culture (routine x 2)     Status: None (Preliminary result)   Collection Time: 02/28/19  9:48 AM  Result Value Ref Range Status   Specimen Description BLOOD RIGHT ANTECUBITAL  Final   Special Requests   Final    BOTTLES DRAWN AEROBIC AND ANAEROBIC Blood Culture adequate volume   Culture   Final    NO GROWTH 3 DAYS Performed at [facility], [address], [place], [place] [address]    Report Status PENDING  Incomplete  Urine culture     Status: None   Collection Time: 02/28/19  9:48 AM  Result Value Ref Range Status   Specimen Description   Final    URINE, RANDOM Performed at [facility], [address], [place], [place] [address]    Special Requests   Final    NONE Performed at [facility], [address] [place], [place] [address]    Culture   Final    NO GROWTH Performed at [facility], [place] [address] [place],  [address]    Report Status 03/01/2019 FINAL  Final  SARS Coronavirus 2 (CEPHEID- Performed in [place] hospital lab), Hosp Order     Status: None   Collection Time: 02/28/19  9:48 AM  Result Value Ref Range Status   SARS Coronavirus 2 NEGATIVE NEGATIVE Final    Comment: (NOTE) If result is  NEGATIVE SARS-CoV-2 target nucleic acids are NOT DETECTED. The SARS-CoV-2 RNA is generally detectable in upper and lower  respiratory specimens during the acute phase of infection. The lowest  concentration of SARS-CoV-2 viral copies this assay can detect is 250  copies / mL. A negative result does not preclude SARS-CoV-2 infection  and should not be used as the sole basis for treatment or other  patient management decisions.  A negative result may occur with  improper specimen collection / handling, submission of specimen other  than nasopharyngeal swab, presence of viral mutation(s) within the  areas targeted by this assay, and inadequate number of viral copies  (<250 copies / mL). A negative result must be combined with clinical  observations, patient history, and epidemiological information. If result is POSITIVE SARS-CoV-2 target nucleic acids are DETECTED. The SARS-CoV-2 RNA is generally detectable in upper and lower  respiratory specimens dur ing the acute phase of infection.  Positive  results are indicative of active infection with SARS-CoV-2.  Clinical  correlation with patient history and other diagnostic information is  necessary to determine patient infection status.  Positive results do  not rule out bacterial infection or co-infection with other viruses. If result is PRESUMPTIVE POSTIVE SARS-CoV-2 nucleic acids MAY BE PRESENT.   A presumptive positive result was obtained on the submitted specimen  and confirmed on repeat testing.  While 2019 novel coronavirus  (SARS-CoV-2) nucleic acids may be present in the submitted sample  additional confirmatory testing may be necessary for epidemiological  and / or clinical management purposes  to differentiate between  SARS-CoV-2 and other Sarbecovirus currently known to infect humans.  If clinically indicated additional testing with an alternate test  methodology [phone]) is advised. The SARS-CoV-2 RNA is generally  detectable  in upper and lower respiratory sp ecimens during the acute  phase of infection. The expected result is Negative. Fact Sheet for Patients:  StrictlyIdeas.no Fact Sheet for Healthcare Providers: BankingDealers.co.za This test is not yet approved or cleared by the [place] FDA and has been authorized for detection and/or diagnosis of SARS-CoV-2 by FDA under an Emergency Use Authorization (EUA).  This EUA will remain in effect (meaning this test can be used) for the duration of the COVID-19 declaration under Section 564(b)(1) of the Act, 21 U.S.C. section 360bbb-3(b)(1), unless the authorization is terminated or revoked sooner. Performed at [facility], [address] [place], [place] [address]   MRSA PCR Screening     Status: None   Collection Time: 02/28/19  2:48 PM  Result Value Ref Range Status   MRSA by PCR NEGATIVE NEGATIVE Final    Comment:        The GeneXpert MRSA Assay (FDA approved for NASAL specimens only), is one component of a comprehensive MRSA colonization surveillance program. It is not intended to diagnose MRSA infection nor to guide or monitor treatment for MRSA infections. Performed at [facility], [address], [place], [place] [address]      Studies: Dg Chest [facility] 1 View  Result Date: 03/02/2019 CLINICAL DATA:  Respiratory failure. EXAM: PORTABLE CHEST 1 VIEW COMPARISON:  Radiograph of Feb 28, 2019. FINDINGS: The heart size and mediastinal contours are within normal limits. Endotracheal tube has been removed. Nasogastric tube tip remains in proximal stomach. Right internal jugular catheter is unchanged. No pneumothorax or pleural effusion is noted. Both lungs are clear. The visualized skeletal structures are unremarkable. IMPRESSION: Endotracheal tube has been removed. Stable position of right internal jugular catheter and nasogastric tube. No acute cardiopulmonary abnormality seen.  Aortic Atherosclerosis (ICD10-I70.0). Electronically Signed   By: Marijo Conception M.D.   On: 03/02/2019 07:36    Scheduled Meds: . [START ON 03/04/2019] aspirin  81 mg Oral Daily  . enoxaparin (LOVENOX) injection  40 mg Subcutaneous Q24H  . feeding  supplement (ENSURE ENLIVE)  237 mL Oral TID BM  . insulin aspart  0-15 Units Subcutaneous TID WC  . insulin glargine  5 Units Subcutaneous Daily  . mouth rinse  15 mL Mouth Rinse BID   Continuous Infusions: . cefTRIAXone (ROCEPHIN)  IV    . dextrose 5 % with kcl 75 mL/hr at 03/03/19 1402    Assessment/Plan:  1. Sepsis with Proteus vulgaris.  Likely the gangrenous toe as the source.  Antibiotics switched from meropenem over to Rocephin.  Case discussed with vascular surgery and podiatry.   2. Acute hypoxic respiratory failure.  Taper oxygen to off. 3. Acute myocardial infarction.  Conservative management as per cardiology.  Heparin drip stopped.  Patient started on aspirin.  Blood pressure too low for other medications at this point. 4. Acute metabolic encephalopathy.  Likely from sepsis but drug use could be contributing. 5. Acute kidney injury.  Creatinine has normalized with IV fluids 6. Cocaine abuse 7. Chronic atrial fibrillation.  Unsure if he is compliant with Eliquis.  Heparin drip was stopped today. 8. History of pancreatic cancer status post Whipple procedure in 2018 9. Diabetic ketoacidosis on presentation.  On sliding scale and Lantus insulin low-dose 10. Appreciate palliative care consultation. 11. Hypernatremia.  Patient was on D5W.  Hopefully as he starts eating this will come better.  Code Status:     Code Status Orders  (From admission, onward)         Start     Ordered   03/02/19 1454  Full code  Continuous     03/02/19 1454        Code Status History    Date Active Date Inactive Code Status Order ID Comments User Context   02/28/2019 1449 03/01/2019 0836 Full Code [id number]  Gorden Harms, MD Inpatient    02/28/2019 1449 02/28/2019 1449 Full Code [id number]  Gorden Harms, MD Inpatient   11/11/2018 0758 11/18/2018 1814 DNR [id number]  Loletha Grayer, MD ED   09/20/2018 1635 09/24/2018 1854 Full Code [id number]  Fritzi Mandes, MD ED   08/03/2018 1020 08/04/2018 1813 Full Code [id number]  Salary, Avel Peace, MD ED   01/09/2018 1025 01/11/2018 1932 Full Code [id number]  Harrie Foreman, MD Inpatient   09/05/2017 1559 09/11/2017 1928 Full Code [id number]  Fritzi Mandes, MD Inpatient   08/24/2017 0238 08/29/2017 2131 Full Code [id number]  Lance Coon, MD Inpatient   08/10/2015 0822 08/11/2015 1947 Full Code [id number]  Harrie Foreman, MD Inpatient     Family Communication: As per critical care team and palliative care team Disposition Plan: As per critical care specialist will go out of the ICU today.  Consultants:  Critical care specialist  Cardiology  Palliative care  Vascular surgery  Podiatry  Antibiotics:  Rocephin  Time spent: 28 minutes in coordination of care speaking with vascular surgery podiatry and critical care specialist.  [facility]

## 2019-03-03 NOTE — Progress Notes (Signed)
SUBJECTIVE: lethargic   Vitals:   03/03/19 0800 03/03/19 0830 03/03/19 0900 03/03/19 0930  BP: (!) 100/57 (!) 90/53 101/66 108/70  Pulse: 97 94 93 99  Resp: 19 13 (!) 23 (!) 25  Temp:      TempSrc:      SpO2: 94% 100% 97% 98%  Weight:      Height:        Intake/Output Summary (Last 24 hours) at 03/03/2019 1048 Last data filed at 03/03/2019 0744 Gross per 24 hour  Intake 1968.28 ml  Output 1150 ml  Net 818.28 ml    LABS: Basic Metabolic Panel: Recent Labs    02/28/19 2038  03/01/19 0724 03/02/19 0423 03/03/19 0521  NA 147*   < > 153* 154* 154*  K 3.5   < > 3.2* 3.4* 3.7  CL 110   < > 115* 120* 118*  CO2 15*   < > 25 27 29   GLUCOSE 425*   < > 180* 167* 222*  BUN 83*   < > 86* 46* 24*  CREATININE 2.69*   < > 1.80* 0.86 0.74  CALCIUM 7.7*   < > 7.6* 7.9* 8.0*  MG 2.2  --  2.1  --   --   PHOS 3.8  --  1.7*  --   --    < > = values in this interval not displayed.   Liver Function Tests: Recent Labs    03/02/19 0423  AST 45*  ALT 16  ALKPHOS 30*  BILITOT 0.6  PROT 6.0*  ALBUMIN 2.5*   No results for input(s): LIPASE, AMYLASE in the last 72 hours. CBC: Recent Labs    03/02/19 0423 03/03/19 0521  WBC 20.2* 17.7*  HGB 6.8* 6.4*  HCT 20.8* 20.5*  MCV 82.2 86.1  PLT 314 310   Cardiac Enzymes: Recent Labs    02/28/19 1831 03/01/19 0038 03/01/19 0724  TROPONINI 4.13* 10.66* 14.28*   BNP: Invalid input(s): POCBNP D-Dimer: No results for input(s): DDIMER in the last 72 hours. Hemoglobin A1C: Recent Labs    02/28/19 1831  HGBA1C >15.5*   Fasting Lipid Panel: No results for input(s): CHOL, HDL, LDLCALC, TRIG, CHOLHDL, LDLDIRECT in the last 72 hours. Thyroid Function Tests: Recent Labs    03/02/19 0423  TSH 0.387   Anemia Panel: No results for input(s): VITAMINB12, FOLATE, FERRITIN, TIBC, IRON, RETICCTPCT in the last 72 hours.   PHYSICAL EXAM General: Well developed, well nourished, in no acute distress HEENT:  Normocephalic and  atramatic Neck:  No JVD.  Lungs: Clear bilaterally to auscultation and percussion. Heart: HRRR . Normal S1 and S2 without gallops or murmurs.  Abdomen: Bowel sounds are positive, abdomen soft and non-tender  Msk:  Back normal, normal gait. Normal strength and tone for age. Extremities: No clubbing, cyanosis or edema.   Neuro: Alert and oriented X 3. Psych:  Good affect, responds appropriately  TELEMETRY: sinus tachycardia  ASSESSMENT AND PLAN:STEMI, with now palliative care. Treat medically  Active Problems:   Respiratory failure ([place])    Neoma Laming A, MD, [facility] 03/03/2019 10:48 AM

## 2019-03-03 NOTE — Progress Notes (Signed)
Nutrition Follow-up  RD working remotely.  DOCUMENTATION CODES:   Not applicable  INTERVENTION:  Provide Ensure Enlive po TID, each supplement provides 350 kcal and 20 grams of protein.   Provide Magic cup TID with meals, each supplement provides 290 kcal and 9 grams of protein.  If patient unable to meet calorie/protein needs from oral diet/oral nutrition supplements, recommend placement of small-bore feeding tube ([place]) for initiation of enteral nutrition support.  Monitor magnesium, potassium, and phosphorus daily for at least 3 days, MD to replete as needed, as pt is at risk for refeeding syndrome.  NUTRITION DIAGNOSIS:   Inadequate oral intake related to inability to eat as evidenced by NPO status.  Resolving - diet has been advanced.  GOAL:   Patient will meet greater than or equal to 90% of their needs  Progressing.  MONITOR:   PO intake, Supplement acceptance, Labs, Weight trends, Skin, I & O's  REASON FOR ASSESSMENT:   Ventilator, Consult Enteral/tube feeding initiation and management  ASSESSMENT:   63 year old male with PMHx of HTN, DM, CAD, neuropathy, chronic back pain, chronic leg pain, CHF, A-fib, hx cocaine abuse, hx pancreatic cancer s/p Whipple procedure who is admitted with AMS, hyperthermia, DKA, respiratory failure requiring intubation on 5/9, also with gangrenous left great toe.  Patient was extubated on 5/10 so tube feeds were never initiated. Sump-style NGT was left in place after extubation. Discussed with RN. Patient is more alert today so plan is to remove NGT and start oral diet today. Patient was ordered for regular diet with thin liquids. Abdomen remains soft per RN documentation. Still no BM this admission.  Medications reviewed and include: Novolog 0-15 TID, Lantus 5 units daily, D5W at 75 mL/hr, meropenem.  Labs reviewed: CBG 197-215, Sodium 154, Chloride 118, BUN 24.  Diet Order:   Diet Order            Diet regular Room service  appropriate? Yes with Assist; Fluid consistency: Thin  Diet effective now             EDUCATION NEEDS:   No education needs have been identified at this time  Skin:  Skin Assessment: Skin Integrity Issues:(gangrenous left great toe)  Last BM:  Unknown/PTA  Height:   Ht Readings from Last 1 Encounters:  02/28/19 5\' 5"  (1.651 m)   Weight:   Wt Readings from Last 1 Encounters:  03/01/19 57.3 kg   Ideal Body Weight:  61.8 kg  BMI:  Body mass index is 21.02 kg/m.  Estimated Nutritional Needs:   Kcal:  1680-1940 (MSJ x 1.3-1.5)  Protein:  85-105 grams (1.5-1.8 grams/kg)  Fluid:  1.8 L/day (30 mL/kg)  Willey Blade, MS, RD, LDN Office: [phone] Pager: [phone] After Hours/Weekend Pager: [phone]

## 2019-03-03 NOTE — Progress Notes (Signed)
PCCM PROGRESS NOTE  PT PROFILE/INITIAL PRESENTATION: 63 y.o. male with hx of pancreatic cancer (resected 2018 and followed by [facility] oncology) DM, CAD (Cardiologist: Humphrey Rolls), PVD, severe protein-calorie malnutrition adm via EMS>ED with AMS, hypothermia, DKA agonal respiratory efforts. Intubated immediately upon arrival to ED   INDWELLING DEVICES: ETT 05/09 >> 05/10 R IJ CVL 05/09 >> 05/12  MICRO DATA: SARS-CoV-2 5/9 >> NEG MRSA PCR 5/9 >> NEG Urine 5/9 >> NEG Blood 5/9 >>1/2 Proteus  ANTIMICROBIALS:  Vanc 05/09 >> 5/10 Cefepime 05/09 >>5/10   Meropenem 05/11 >> 05/12  SUBJ:  More awake and alert (RASS 0, -1).  No distress.  Better oriented.  No new complaints  Vitals:   03/03/19 1030 03/03/19 1100 03/03/19 1130 03/03/19 1200  BP: 101/60 103/67 117/73 110/64  Pulse: 96 92 95 96  Resp: _0 Temp:      TempSrc:      SpO2: 99% 95% 100% 99%  Weight:      Height:      Room air      05/11 0701 - 05/12 0700 In: 929.6 [I.V.:729.6; IV Piggyback:200] Out: 850 [Urine:850]   Intake/Output Summary (Last 24 hours) at 03/03/2019 1219 Last data filed at 03/03/2019 0744 Gross per 24 hour  Intake 1724.95 ml  Output 1150 ml  Net 574.95 ml     EXAM:  Gen: NAD HEENT: NCAT, sclerae white Neck: No JVD noted Lungs: Chest clear Cardiovascular: Regular, no M Abdomen: Soft, NT, NABS Ext: No edema Neuro: Sensorium improving, no focal deficits Skin: Limited exam, no lesions noted  DATA:   BMP Latest Ref Rng & Units 03/03/2019 03/02/2019 03/01/2019  Glucose 70 - 99 mg/dL 222(H) 167(H) 180(H)  BUN 8 - 23 mg/dL 24(H) 46(H) 86(H)  Creatinine 0.61 - 1.24 mg/dL 0.74 0.86 1.80(H)  Sodium 135 - 145 mmol/L 154(H) 154(H) 153(H)  Potassium 3.5 - 5.1 mmol/L 3.7 3.4(L) 3.2(L)  Chloride 98 - 111 mmol/L 118(H) 120(H) 115(H)  CO2 22 - 32 mmol/L _1 Calcium 8.9 - 10.3 mg/dL 8.0(L) 7.9(L) 7.6(L)    CBC Latest Ref Rng & Units 03/03/2019 03/02/2019 03/01/2019  WBC 4.0 - 10.5 K/uL 17.7(H)  20.2(H) 16.9(H)  Hemoglobin 13.0 - 17.0 g/dL 6.4(L) 6.8(L) 7.2(L)  Hematocrit 39.0 - 52.0 % 20.5(L) 20.8(L) 22.2(L)  Platelets 150 - 400 K/uL 310 314 331    Hepatic Function Latest Ref Rng & Units 03/02/2019 02/28/2019 11/11/2018  Total Protein 6.5 - 8.1 g/dL 6.0(L) 5.9(L) 7.4  Albumin 3.5 - 5.0 g/dL 2.5(L) 2.3(L) 3.8  AST 15 - 41 U/L 45(H) 23 17  ALT 0 - 44 U/L _2 Alk Phosphatase 38 - 126 U/L 30(L) 40 31(L)  Total Bilirubin 0.3 - 1.2 mg/dL 0.6 2.1(H) 0.7  Bilirubin, Direct 0.0 - 0.2 mg/dL - - -     Cardiac Panel (last 3 results) Recent Labs    02/28/19 1831 03/01/19 0038 03/01/19 0724  TROPONINI 4.13* 10.66* 14.28*    BNP (last 3 results) No results for input(s): BNP in the last 8760 hours.  ProBNP (last 3 results) No results for input(s): PROBNP in the last 8760 hours.    CXR: No new film  I have personally reviewed all chest radiographs reported above including CXRs and CT chest unless otherwise indicated  IMPRESSION:   VDRF, resolved  NSTEMI - Cardiology recommends conservative mangement PAF > NSR presently Hypotension, resolved Severe PVD  Gangrenous L great toe - will need eval prior to DC  from hospital AKI, resolved Severe metabolic acidosis, resolved Hypokalemia, resolved Hyperosmolality, resolving Hypernatremia Severe protein-calorie malnutrition History of pancreatic cancer, s/p resection 2018  CA 19-9 61 (was 66 @ [facility] 11/22/17) S/P splenectomy 2018 Type 2 diabetes with hyperglycemia Proteus bacteremia, unclear source (possible gangrenous left great toe) Acute on chronic anemia without overt blood loss Acute encephalopathy -likely TME Urine drug screen + for cocaine on admission Poor chronic health and guarded prognosis  PLAN:  Continue to monitor in SDU due to acute encephalopathy Supplemental oxygen as needed Continue hemodynamic monitoring Cardiology consultation appreciated Conservative management Per cardiology, continue full dose  UFH for now Monitor BMET intermittently Monitor I/Os Correct electrolytes as indicated Continue D5W + KCl through 05/13 to correct free water deficit Advance diet as tolerated Initiate low-dose Lantus 5/12 Change SSI to [facility] at bedtime (moderate scale) Monitor temp, WBC count Micro and abx as above  DVT px: Enoxaparin (full dose heparin discontinued 5/12) Monitor CBC intermittently Transfuse per usual guidelines  1 unit PRBCs ordered 5/12 Palliative care consultation appreciated Discontinue NG tube 5/12 Advance activity as tolerated.  PT evaluation requested    Merton Border, MD PCCM service Mobile [phone] Pager [phone] 03/03/2019 12:19 PM

## 2019-03-03 NOTE — Consult Note (Signed)
ANTICOAGULATION CONSULT NOTE - Initial Consult  Pharmacy Consult for heparin drip management Indication: atrial fibrillation  Patient Measurements: Height: 5\' 5"  (165.1 cm) Weight: 126 lb 5.2 oz (57.3 kg) IBW/kg (Calculated) : 61.5  Vital Signs: Temp: 98.5 F (36.9 C) (05/12 0200) Temp Source: Axillary (05/12 0200) BP: 104/74 (05/12 0600) Pulse Rate: 87 (05/12 0600)  Labs: Recent Labs    02/28/19 1550 02/28/19 1831  02/28/19 2300  03/01/19 0038 03/01/19 0404 03/01/19 0724 03/02/19 0423 03/03/19 0521 03/03/19 0626  HGB  --   --    < >  --   --   --  7.2*  --  6.8* 6.4*  --   HCT  --   --    < >  --   --   --  22.2*  --  20.8* 20.5*  --   PLT  --   --    < >  --   --   --  331  --  314 310  --   APTT  --   --   --  135*  --   --   --  98*  --   --   --   LABPROT 18.2*  --   --   --   --   --   --   --   --   --   --   INR 1.5*  --   --   --   --   --   --   --   --   --   --   HEPARINUNFRC  --   --    < > 0.65  --   --   --  0.43 0.34  --  0.39  CREATININE  --   --    < >  --    < >  --  1.88* 1.80* 0.86 0.74  --   TROPONINI 2.71* 4.13*  --   --   --  10.66*  --  14.28*  --   --   --    < > = values in this interval not displayed.    Estimated Creatinine Clearance: 75.6 mL/min (by C-G formula based on SCr of 0.74 mg/dL).   Medical History: Past Medical History:  Diagnosis Date  . Atrial fibrillation ([place])    per patient  . Cancer ([place])   . CHF (congestive heart failure) ([place])   . Chronic back pain   . Chronic leg pain   . Coronary artery disease   . Diabetes mellitus without complication ([place])   . Hypertension   . Neuropathy    Assessment: 27 YOM with a h/o afib on apixaban PTA presents to the ED with AMS. I spoke with his significant other, Rollene Fare, who tells me she is certain he did not take this morning's apixaban dose but is unsure of the administration prior to that. He was admitted to [facility] in January 2020 and received heparin. I am using that  information to guide dosing. Initial troponin 2.31, baseline hgb 7.7, PLT 417  Goal of Therapy:  Heparin level 0.3-0.7 units/ml aPTT 66-102s Monitor platelets by anticoagulation protocol: Yes   Plan:  05/12 @ 0626 HL 0.39 therapeutic. Will continue current rate of heaprin 1050 units/hr and recheck w/ am labs., patient's hgb 6.4, needs blood transfusion, will continue to monitor.  Pernell Dupre, PharmD, BCPS Clinical Pharmacist 03/03/2019 7:05 AM

## 2019-03-03 NOTE — Progress Notes (Addendum)
Inpatient Diabetes Program Recommendations  AACE/ADA: New Consensus Statement on Inpatient Glycemic Control   Target Ranges:  Prepandial:   less than 140 mg/dL      Peak postprandial:   less than 180 mg/dL (1-2 hours)      Critically ill patients:  140 - 180 mg/dL   Results for ANUBIS, FUNDORA (MRN [id number]) as of 03/03/2019 09:46  Ref. Range 03/02/2019 07:25 03/02/2019 11:32 03/02/2019 16:47 03/02/2019 20:05 03/02/2019 23:38 03/03/2019 03:38 03/03/2019 07:30  Glucose-Capillary Latest Ref Range: 70 - 99 mg/dL 191 (H) 230 (H) 259 (H) 213 (H) 222 (H) 215 (H) 197 (H)  Results for MORTIMER, BAIR (MRN [id number]) as of 03/03/2019 09:46  Ref. Range 02/28/2019 09:48 02/28/2019 18:31  Glucose Latest Ref Range: 70 - 99 mg/dL 923 (HH)   Hemoglobin A1C Latest Ref Range: 4.8 - 5.6 %  >15.5 (H)   Review of Glycemic Control  Outpatient Diabetes medications: Levemir 17 units daily, Novolog 5 units TID with meals, Novolog 0-5 units QHS Current orders for Inpatient glycemic control: Lantus 5 units daily, Novolog 0-15 units TID with meals  Inpatient Diabetes Program Recommendations:   Insulin - Basal: Noted Lantus 5 units daily just ordered this morning.  Correction (SSI): Please consider ordering Novolog 0-5 units QHS for bedtime correction.  Insulin - Meal Coverage: If post prandial glucose is consistently >180 mg/dl, please consider ordering Novolog 4 units TID with meals if patient eats at least 50% of meals.  HgbA1C: A1C >15.5% on 02/28/19 indicating an average glucose over 398 mg/dl over the past 2-3 months. Patient was last seen by Inpatient Diabetes Coordinator on 11/13/18 during prior admission.  Diet: Please discontinue Regular diet and order Carb Modified if appropriate.  Addendum 03/03/19@14 :10-Spoke with patient regarding DM regimen and control. Patient is very drowsy and not able to provide much information at all. Patient will answer simple 'yes or no' questions but not able to contribute any meaningful  information. Patient states "yes" when asked if he took Levemir and Novolog insulin at home but not able to verbalize dosage of either insulin that he is taking. Will plan for diabetes coordinator to follow up with patient again on 5/13 to see if patient if more alert and able to participate in conversation about DM control.   Thanks, Barnie Alderman, RN, MSN, CDE Diabetes Coordinator Inpatient Diabetes Program [phone] (Team Pager from 8am to 5pm)

## 2019-03-03 NOTE — Progress Notes (Signed)
PT Cancellation Note  Patient Details Name: Richard [name] MRN: [id number] DOB: 18-Apr-1956   Cancelled Treatment:    Reason Eval/Treat Not Completed: Patient not medically ready Pt with continued medical issues and critical lab values that preclude initiating of PT care at this time.  Will maintain on caseload and continue to follow for appropriateness of PT intervention.   Kreg Shropshire, DPT 03/03/2019, 1:27 PM

## 2019-03-03 NOTE — Consult Note (Signed)
ORTHOPAEDIC CONSULTATION  REQUESTING PHYSICIAN: Loletha Grayer, MD  Chief Complaint: Gangrene left great toe  HPI: Richard [name] is a 63 y.o. male with gangrenous left great toe.  Patient is in the ICU currently.  Extubated and alert and oriented at this time.  Currently admitted and initially intubated.  Was found to be septic.  Has bacteremia noted.  History of cocaine use with positive toxicology on 5 9.  History of diabetes and status post right foot transmetatarsal amputation he states last year in [place].  Denies pain to his left foot and leg.  Not been seen by vascular surgery recently.  Past Medical History:  Diagnosis Date  . Atrial fibrillation ([place])    per patient  . Cancer ([place])   . CHF (congestive heart failure) ([place])   . Chronic back pain   . Chronic leg pain   . Coronary artery disease   . Diabetes mellitus without complication ([place])   . Hypertension   . Neuropathy    Past Surgical History:  Procedure Laterality Date  . CORONARY ANGIOPLASTY WITH STENT PLACEMENT    . ESOPHAGOGASTRODUODENOSCOPY N/A 09/11/2017   Procedure: ESOPHAGOGASTRODUODENOSCOPY (EGD);  Surgeon: Virgel Manifold, MD;  Location: [facility] ENDOSCOPY;  Service: Endoscopy;  Laterality: N/A;  . LOWER EXTREMITY ANGIOGRAPHY Left 11/17/2018   Procedure: Lower Extremity Angiography, with possible intervention;  Surgeon: Algernon Huxley, MD;  Location: [facility] CV LAB;  Service: Cardiovascular;  Laterality: Left;  . pancreas removed     Social History   Socioeconomic History  . Marital status: Single    Spouse name: Not on file  . Number of children: Not on file  . Years of education: Not on file  . Highest education level: Not on file  Occupational History  . Not on file  Social Needs  . Financial resource strain: Not on file  . Food insecurity:    Worry: Not on file    Inability: Not on file  . Transportation needs:    Medical: Not on file    Non-medical: Not on file  Tobacco Use  .  Smoking status: Current Every Day Smoker    Packs/day: 0.50  . Smokeless tobacco: Never Used  Substance and Sexual Activity  . Alcohol use: No    Alcohol/week: 0.0 standard drinks  . 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 Cocaine used over a year ago per pt.  . Sexual activity: Not on file  Lifestyle  . Physical activity:    Days per week: Not on file    Minutes per session: Not on file  . Stress: Not on file  Relationships  . Social connections:    Talks on phone: Not on file    Gets together: Not on file    Attends religious service: Not on file    Active member of club or organization: Not on file    Attends meetings of clubs or organizations: Not on file    Relationship status: Not on file  Other Topics Concern  . Not on file  Social History Narrative  . Not on file   Family History  Problem Relation Age of Onset  . CAD Mother   . Diabetes Mellitus II Mother   . Diabetes Mellitus II Father    No Known Allergies Prior to Admission medications   Medication Sig Start Date End Date Taking? Authorizing Provider  amitriptyline (ELAVIL) 50 MG tablet Take 1 tablet (50 mg  total) by mouth at bedtime. 08/04/18  Yes Salary, Avel Peace, MD  apixaban (ELIQUIS) 5 MG TABS tablet Take 1 tablet (5 mg total) by mouth 2 (two) times daily. 09/23/18  Yes Gouru, Illene Silver, MD  atorvastatin (LIPITOR) 40 MG tablet Take 1 tablet (40 mg total) by mouth every evening. 11/18/18  Yes Dustin Flock, MD  gabapentin (NEURONTIN) 100 MG capsule Take 1 capsule (100 mg total) by mouth 3 (three) times daily. 08/04/18  Yes Salary, Montell D, MD  insulin aspart (NOVOLOG) 100 UNIT/ML injection Inject 0-5 Units into the skin at bedtime. CBG < 70: implement hypoglycemia protocol CBG 70 - 120: 0 units CBG 121 - 150: 0 units CBG 151 - 200: 0 units CBG 201 - 250: 2 units CBG 251 - 300: 3 units CBG 301 - 350: 4 units CBG 351 - 400: 5 units CBG > 400: call MD 11/18/18  Yes Dustin Flock, MD  LEVEMIR FLEXTOUCH 100 UNIT/ML Pen Inject 17 Units into the skin daily. 09/25/18  Yes [provider]  Multiple Vitamin (MULTIVITAMIN WITH MINERALS) TABS tablet Take 1 tablet by mouth daily. 01/12/18  Yes Gouru, Aruna, MD  NOVOLOG FLEXPEN 100 UNIT/ML FlexPen Inject 5 Units into the skin 3 (three) times daily before meals. 09/25/18  Yes [provider]  traZODone (DESYREL) 50 MG tablet Take 1 tablet (50 mg total) by mouth at bedtime as needed for sleep. 08/04/18  Yes Salary, Avel Peace, MD  ACCU-CHEK AVIVA PLUS test strip U ONCE TO BID UTD 09/23/18   Nicholes Mango, MD  ACCU-CHEK SOFTCLIX LANCETS lancets TEST 1-2 XD 09/23/18   Gouru, Illene Silver, MD  acetaminophen (TYLENOL) 325 MG tablet Take 1 tablet (325 mg total) by mouth every 6 (six) hours as needed for mild pain (or Fever >/= 101). 01/11/18   Gouru, Aruna, MD  aspirin 81 MG chewable tablet Chew 1 tablet (81 mg total) by mouth daily. Patient not taking: Reported on 11/11/2018 01/12/18   Nicholes Mango, MD  Blood Glucose Monitoring Suppl (ACCU-CHEK AVIVA PLUS) w/Device KIT U TO TEST ONCE TO BID 09/23/18   Gouru, Illene Silver, MD  HYDROcodone-acetaminophen (NORCO/VICODIN) 5-325 MG tablet Take 1-2 tablets by mouth every 6 (six) hours as needed for moderate pain. Patient not taking: Reported on 02/28/2019 11/18/18   Dustin Flock, MD  Needles & Syringes MISC Use as directed 09/23/18   Nicholes Mango, MD   Dg Chest Port 1 View  Result Date: 03/02/2019 CLINICAL DATA:  Respiratory failure. EXAM: PORTABLE CHEST 1 VIEW COMPARISON:  Radiograph of Feb 28, 2019. FINDINGS: The heart size and mediastinal contours are within normal limits. Endotracheal tube has been removed. Nasogastric tube tip remains in proximal stomach. Right internal jugular catheter is unchanged. No pneumothorax or pleural effusion is noted. Both lungs are clear. The visualized skeletal structures are unremarkable. IMPRESSION: Endotracheal tube has been removed. Stable position of right  internal jugular catheter and nasogastric tube. No acute cardiopulmonary abnormality seen. Aortic Atherosclerosis (ICD10-I70.0). Electronically Signed   By: Marijo Conception M.D.   On: 03/02/2019 07:36    Positive ROS: All other systems have been reviewed and were otherwise negative with the exception of those mentioned in the HPI and as above.  12 point ROS was performed.  Physical Exam: General: Alert and oriented.  No apparent distress.  Vascular:  Left foot:Dorsalis Pedis:  absent Posterior Tibial:  absent  Right foot: Dorsalis Pedis:  absent Posterior Tibial:  absent   I was unable to obtain a dopplerable pulse today.  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  Left foot with noted gangrenous changes of the entire great toe.  Early gangrenous changes of the medial aspect of the second toe is noted.  Darkened discoloration of the dorsal first ray.  Superficial blistering of the dorsal foot.  No active purulent drainage at this time.  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   Left foot with range of motion at the ankle. Assessment: Gangrene left great toe Diabetes with peripheral vascular disease  Plan: Patient currently has nonpalpable and non-dopplerable pulses.  Will need vascular evaluation and revascularization prior to amputation but patient obviously needs amputation of his great toe and likely a second toe.  He is at high risk of further amputation as he is undergone transmetatarsal amputation on the contralateral foot in the past.  For now patient is stable.  He has been readmitted to the medical floor off the ICU.  We will continue to monitor while in-house and follow-up.  Hopefully plan for surgery at the end of the week if revascularization can be performed in the next 1 to 2 days.    Richard [name], DPM Cell [phone]   03/03/2019 3:14 PM

## 2019-03-04 ENCOUNTER — Encounter
Admission: EM | Disposition: A | Discharge status: Skilled Nursing Facility | Payer: Self-pay | Source: Home / Self Care | Attending: Internal Medicine

## 2019-03-04 DIAGNOSIS — I10 Essential (primary) hypertension: Secondary | ICD-10-CM

## 2019-03-04 DIAGNOSIS — E1151 Type 2 diabetes mellitus with diabetic peripheral angiopathy without gangrene: Secondary | ICD-10-CM

## 2019-03-04 DIAGNOSIS — F17210 Nicotine dependence, cigarettes, uncomplicated: Secondary | ICD-10-CM

## 2019-03-04 DIAGNOSIS — A419 Sepsis, unspecified organism: Secondary | ICD-10-CM

## 2019-03-04 DIAGNOSIS — I70262 Atherosclerosis of native arteries of extremities with gangrene, left leg: Secondary | ICD-10-CM

## 2019-03-04 DIAGNOSIS — Z79899 Other long term (current) drug therapy: Secondary | ICD-10-CM

## 2019-03-04 HISTORY — PX: LOWER EXTREMITY ANGIOGRAPHY: CATH118251

## 2019-03-04 LAB — CBC
HCT: 21.6 % — ABNORMAL LOW (ref 39.0–52.0)
Hemoglobin: 6.9 g/dL — ABNORMAL LOW (ref 13.0–17.0)
MCH: 27.6 pg (ref 26.0–34.0)
MCHC: 31.9 g/dL (ref 30.0–36.0)
MCV: 86.4 fL (ref 80.0–100.0)
Platelets: 249 10*3/uL (ref 150–400)
RBC: 2.5 MIL/uL — ABNORMAL LOW (ref 4.22–5.81)
RDW: 15.9 % — ABNORMAL HIGH (ref 11.5–15.5)
WBC: 15.8 10*3/uL — ABNORMAL HIGH (ref 4.0–10.5)
nRBC: 1 % — ABNORMAL HIGH (ref 0.0–0.2)

## 2019-03-04 LAB — COMPREHENSIVE METABOLIC PANEL
ALT: 14 U/L (ref 0–44)
AST: 26 U/L (ref 15–41)
Albumin: 2.3 g/dL — ABNORMAL LOW (ref 3.5–5.0)
Alkaline Phosphatase: 35 U/L — ABNORMAL LOW (ref 38–126)
Anion gap: 7 (ref 5–15)
BUN: 17 mg/dL (ref 8–23)
CO2: 27 mmol/L (ref 22–32)
Calcium: 7.5 mg/dL — ABNORMAL LOW (ref 8.9–10.3)
Chloride: 106 mmol/L (ref 98–111)
Creatinine, Ser: 0.74 mg/dL (ref 0.61–1.24)
GFR calc Af Amer: >60 mL/min (ref >60)
GFR calc non Af Amer: >60 mL/min (ref >60)
Glucose, Bld: 384 mg/dL — ABNORMAL HIGH (ref 70–99)
Potassium: 4.4 mmol/L (ref 3.5–5.1)
Sodium: 140 mmol/L (ref 135–145)
Total Bilirubin: 0.5 mg/dL (ref 0.3–1.2)
Total Protein: 5.6 g/dL — ABNORMAL LOW (ref 6.5–8.1)

## 2019-03-04 LAB — GLUCOSE, CAPILLARY
Glucose-Capillary: 119 mg/dL — ABNORMAL HIGH (ref 70–99)
Glucose-Capillary: 155 mg/dL — ABNORMAL HIGH (ref 70–99)
Glucose-Capillary: 168 mg/dL — ABNORMAL HIGH (ref 70–99)
Glucose-Capillary: 277 mg/dL — ABNORMAL HIGH (ref 70–99)
Glucose-Capillary: 282 mg/dL — ABNORMAL HIGH (ref 70–99)
Glucose-Capillary: 295 mg/dL — ABNORMAL HIGH (ref 70–99)
Glucose-Capillary: 309 mg/dL — ABNORMAL HIGH (ref 70–99)
Glucose-Capillary: 360 mg/dL — ABNORMAL HIGH (ref 70–99)
Glucose-Capillary: 41 mg/dL — CL (ref 70–99)

## 2019-03-04 LAB — PREPARE RBC (CROSSMATCH)

## 2019-03-04 SURGERY — LOWER EXTREMITY ANGIOGRAPHY
Anesthesia: Moderate Sedation | Laterality: Left

## 2019-03-04 MED ORDER — FENTANYL CITRATE (PF) 100 MCG/2ML IJ SOLN
INTRAMUSCULAR | Status: AC
  Filled 2019-03-04: qty 2

## 2019-03-04 MED ORDER — TIROFIBAN (AGGRASTAT) BOLUS VIA INFUSION
25.0000 ug/kg | Freq: Once | INTRAVENOUS | Status: DC
  Filled 2019-03-04: qty 29

## 2019-03-04 MED ORDER — ACETAMINOPHEN 325 MG PO TABS
650.0000 mg | ORAL_TABLET | Freq: Once | ORAL | Status: AC
  Administered 2019-03-04: 650 mg via ORAL
  Filled 2019-03-04: qty 2

## 2019-03-04 MED ORDER — NALOXONE HCL 0.4 MG/ML IJ SOLN: 0.4000 mg | INTRAMUSCULAR | Status: DC | PRN

## 2019-03-04 MED ORDER — CEFAZOLIN SODIUM-DEXTROSE 2-4 GM/100ML-% IV SOLN
INTRAVENOUS | Status: AC
  Administered 2019-03-04: 2000 mg
  Filled 2019-03-04: qty 100

## 2019-03-04 MED ORDER — DEXTROSE 50 % IV SOLN
INTRAVENOUS | Status: AC
  Administered 2019-03-04: 50 mL
  Filled 2019-03-04: qty 50

## 2019-03-04 MED ORDER — ONDANSETRON HCL 4 MG/2ML IJ SOLN
INTRAMUSCULAR | Status: AC
  Filled 2019-03-04: qty 2

## 2019-03-04 MED ORDER — SODIUM CHLORIDE 0.9% IV SOLUTION
Freq: Once | INTRAVENOUS | Status: AC
  Administered 2019-03-04: 11:00:00 via INTRAVENOUS

## 2019-03-04 MED ORDER — HEPARIN SODIUM (PORCINE) 1000 UNIT/ML IJ SOLN
INTRAMUSCULAR | Status: AC
  Filled 2019-03-04: qty 1

## 2019-03-04 MED ORDER — FENTANYL CITRATE (PF) 100 MCG/2ML IJ SOLN
INTRAMUSCULAR | Status: DC | PRN
  Administered 2019-03-04 (×4): 50 ug via INTRAVENOUS

## 2019-03-04 MED ORDER — IOHEXOL 300 MG/ML  SOLN
INTRAMUSCULAR | Status: DC | PRN
  Administered 2019-03-04: 120 mL via INTRAVENOUS

## 2019-03-04 MED ORDER — FUROSEMIDE 10 MG/ML IJ SOLN
20.0000 mg | Freq: Once | INTRAMUSCULAR | Status: AC
  Administered 2019-03-04: 11:00:00 20 mg via INTRAVENOUS
  Filled 2019-03-04: qty 2

## 2019-03-04 MED ORDER — MORPHINE SULFATE (PF) 2 MG/ML IV SOLN
1.0000 mg | INTRAVENOUS | Status: DC | PRN
  Administered 2019-03-05: 2 mg via INTRAVENOUS
  Filled 2019-03-04 (×2): qty 1

## 2019-03-04 MED ORDER — HEPARIN SODIUM (PORCINE) 1000 UNIT/ML IJ SOLN
INTRAMUSCULAR | Status: DC | PRN
  Administered 2019-03-04: 4000 [IU] via INTRAVENOUS

## 2019-03-04 MED ORDER — LIDOCAINE-EPINEPHRINE (PF) 1 %-1:200000 IJ SOLN
INTRAMUSCULAR | Status: AC
  Filled 2019-03-04: qty 30

## 2019-03-04 MED ORDER — SODIUM CHLORIDE 0.9 % IV SOLN
INTRAVENOUS | Status: DC
  Administered 2019-03-04: 1000 mL via INTRAVENOUS

## 2019-03-04 MED ORDER — SODIUM CHLORIDE FLUSH 0.9 % IV SOLN
INTRAVENOUS | Status: AC
  Administered 2019-03-04: 10 mL
  Filled 2019-03-04: qty 10

## 2019-03-04 MED ORDER — NALOXONE HCL 2 MG/2ML IJ SOSY
PREFILLED_SYRINGE | INTRAMUSCULAR | Status: AC
  Filled 2019-03-04: qty 2

## 2019-03-04 MED ORDER — DEXTROSE 5 % IV SOLN
INTRAVENOUS | Status: DC
  Administered 2019-03-04: 17:00:00 via INTRAVENOUS

## 2019-03-04 MED ORDER — CEFAZOLIN SODIUM-DEXTROSE 2-4 GM/100ML-% IV SOLN
2.0000 g | INTRAVENOUS | Status: DC
  Filled 2019-03-04: qty 100

## 2019-03-04 MED ORDER — MIDAZOLAM HCL 2 MG/2ML IJ SOLN
INTRAMUSCULAR | Status: AC
  Filled 2019-03-04: qty 4

## 2019-03-04 MED ORDER — MIDAZOLAM HCL 2 MG/2ML IJ SOLN
INTRAMUSCULAR | Status: DC | PRN
  Administered 2019-03-04 (×3): 2 mg via INTRAVENOUS

## 2019-03-04 MED ORDER — MIDAZOLAM HCL 2 MG/2ML IJ SOLN
INTRAMUSCULAR | Status: AC
  Filled 2019-03-04: qty 2

## 2019-03-04 MED ORDER — TIROFIBAN HCL IN NACL 5-0.9 MG/100ML-% IV SOLN
0.1500 ug/kg/min | INTRAVENOUS | Status: DC
  Filled 2019-03-04 (×4): qty 100

## 2019-03-04 SURGICAL SUPPLY — 27 items
BALLN DORADO 6X200X135 (BALLOONS) ×3
BALLN LUTONIX 018 5X300X130 (BALLOONS) ×3
BALLN ULTRVRSE 3X220X150 (BALLOONS) ×3
BALLN ULTRVRSE 4X150X150 (BALLOONS) ×3
BALLOON DORADO 6X200X135 (BALLOONS) ×1 IMPLANT
BALLOON LUTONIX 018 5X300X130 (BALLOONS) ×1 IMPLANT
BALLOON ULTRVRSE 3X220X150 (BALLOONS) ×1 IMPLANT
BALLOON ULTRVRSE 4X150X150 (BALLOONS) ×1 IMPLANT
CANISTER PENUMBRA ENGINE (MISCELLANEOUS) ×3 IMPLANT
CATH BEACON 5 .038 100 VERT TP (CATHETERS) ×3 IMPLANT
CATH INDIGO CAT6 KIT (CATHETERS) ×3 IMPLANT
CATH PIG 70CM (CATHETERS) ×3 IMPLANT
DEVICE PRESTO INFLATION (MISCELLANEOUS) ×3 IMPLANT
DEVICE SAFEGUARD 24CM (GAUZE/BANDAGES/DRESSINGS) ×3 IMPLANT
DEVICE STARCLOSE SE CLOSURE (Vascular Products) ×3 IMPLANT
GLIDEWIRE ADV .035X260CM (WIRE) ×3 IMPLANT
PACK ANGIOGRAPHY (CUSTOM PROCEDURE TRAY) ×3 IMPLANT
SHEATH ANL2 6FRX45 HC (SHEATH) ×3 IMPLANT
SHEATH BRITE TIP 5FRX11 (SHEATH) ×3 IMPLANT
SHEATH DESTIN RDC 6FR 45 (SHEATH) ×3 IMPLANT
STENT VIABAHN 5X100X120 (Permanent Stent) ×3 IMPLANT
STENT VIABAHN 6X7.5X120 (Permanent Stent) ×3 IMPLANT
SYR MEDRAD MARK 7 150ML (SYRINGE) ×3 IMPLANT
TUBING CONTRAST HIGH PRESS 72 (TUBING) ×3 IMPLANT
VALVE HEMO TOUHY BORST Y (ADAPTER) ×3 IMPLANT
WIRE G V18X300CM (WIRE) ×3 IMPLANT
WIRE J 3MM .035X145CM (WIRE) ×3 IMPLANT

## 2019-03-04 NOTE — Care Management Important Message (Signed)
Important Message  Patient Details  Name: Richard [name] MRN: [id number] Date of Birth: 12-Sep-1956   Medicare Important Message Given:  Yes    Juliann Pulse A Yolani Vo 03/04/2019, 10:57 AM

## 2019-03-04 NOTE — Consult Note (Signed)
Full consult to follow Left foot gangrene For angiogram today

## 2019-03-04 NOTE — Consult Note (Addendum)
[name] Vascular Consult Note  MRN : [id number]  Richard [name] is a 63 y.o. ([birth year]) male who presents with chief complaint of  Chief Complaint  Patient presents with  . Code Sepsis  .  History of Present Illness: I am asked to see the patient by Dr. Leslye Peer for gangrenous left great toe.  He has a known history of peripheral arterial disease and underwent extensive left lower extremity revascularization earlier this year.  He did not follow-up in the office or take his medications as prescribed and we have not seen him since that original revascularization.  He has a previous history of a right transmetatarsal amputation in the past at another institution.  He presented with worsening pain and infection from the left foot.  He has clear gangrenous changes to the great toe and the surrounding tissue.  It is very painful.  He had fever on admission but none over the past 24 hours.  There has been some drainage as well.  No current right leg symptoms.  Current Facility-Administered Medications  Medication Dose Route Frequency Provider Last Rate Last Dose  . 0.9 %  sodium chloride infusion   Intravenous Continuous Algernon Huxley, MD      . acetaminophen (TYLENOL) tablet 650 mg  650 mg Oral Q6H PRN Wilhelmina Mcardle, MD   650 mg at 03/04/19 0152  . aspirin chewable tablet 81 mg  81 mg Oral Daily Wilhelmina Mcardle, MD   81 mg at 03/04/19 0818  . ceFAZolin (ANCEF) 2-4 GM/100ML-% IVPB           . ceFAZolin (ANCEF) IVPB 2g/100 mL premix  2 g Intravenous 30 min Pre-Op Algernon Huxley, MD      . cefTRIAXone (ROCEPHIN) 2 g in sodium chloride 0.9 % 100 mL IVPB  2 g Intravenous Q24H Berton Mount, RPH   Stopped at 03/03/19 2027  . enoxaparin (LOVENOX) injection 40 mg  40 mg Subcutaneous Q24H Wilhelmina Mcardle, MD   40 mg at 03/04/19 0818  . etomidate (AMIDATE) injection   Intravenous PRN Arta Silence, MD   20 mg at 02/28/19 0905  . feeding supplement (ENSURE ENLIVE)  (ENSURE ENLIVE) liquid 237 mL  237 mL Oral TID BM Wilhelmina Mcardle, MD   237 mL at 03/03/19 2100  . insulin aspart (novoLOG) injection 0-15 Units  0-15 Units Subcutaneous TID WC Wilhelmina Mcardle, MD   3 Units at 03/04/19 1219  . insulin glargine (LANTUS) injection 5 Units  5 Units Subcutaneous Daily Wilhelmina Mcardle, MD   5 Units at 03/04/19 1053  . MEDLINE mouth rinse  15 mL Mouth Rinse BID Tukov-Yual, Magdalene S, NP   15 mL at 03/04/19 0820  . morphine 2 MG/ML injection 1-2 mg  1-2 mg Intravenous Q4H PRN Mansy, Jan A, MD      . ondansetron (ZOFRAN) injection 4 mg  4 mg Intravenous Q6H PRN Salary, Avel Peace, MD        Past Medical History:  Diagnosis Date  . Atrial fibrillation ([place])    per patient  . Cancer ([place])   . CHF (congestive heart failure) ([place])   . Chronic back pain   . Chronic leg pain   . Coronary artery disease   . Diabetes mellitus without complication ([place])   . Hypertension   . Neuropathy     Past Surgical History:  Procedure Laterality Date  . CORONARY ANGIOPLASTY WITH STENT PLACEMENT    .  ESOPHAGOGASTRODUODENOSCOPY N/A 09/11/2017   Procedure: ESOPHAGOGASTRODUODENOSCOPY (EGD);  Surgeon: Virgel Manifold, MD;  Location: [facility] ENDOSCOPY;  Service: Endoscopy;  Laterality: N/A;  . LOWER EXTREMITY ANGIOGRAPHY Left 11/17/2018   Procedure: Lower Extremity Angiography, with possible intervention;  Surgeon: Algernon Huxley, MD;  Location: [facility];  Service: Cardiovascular;  Laterality: Left;  . pancreas removed      Social History Social History   Tobacco Use  . Smoking status: Current Every Day Smoker    Packs/day: 0.50  . Smokeless tobacco: Never Used  Substance Use Topics  . Alcohol use: No    Alcohol/week: 0.0 standard drinks  . 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 Cocaine used over a year ago per pt.    Family History Family History  Problem Relation Age of Onset  . CAD Mother   . Diabetes Mellitus II  Mother   . Diabetes Mellitus II Father   no bleeding or clotting disorders  No Known Allergies   REVIEW OF SYSTEMS (Negative unless checked)  Constitutional: [] Weight loss  [x] Fever  [] Chills Cardiac: [] Chest pain   [] Chest pressure   [] Palpitations   [] Shortness of breath when laying flat   [] Shortness of breath at rest   [x] Shortness of breath with exertion. Vascular:  [] Pain in legs with walking   [] Pain in legs at rest   [] Pain in legs when laying flat   [] Claudication   [x] Pain in feet when walking  [x] Pain in feet at rest  [] Pain in feet when laying flat   [] History of DVT   [] Phlebitis   [] Swelling in legs   [] Varicose veins   [x] Non-healing ulcers Pulmonary:   [] Uses home oxygen   [] Productive cough   [] Hemoptysis   [] Wheeze  [] COPD   [] Asthma Neurologic:  [] Dizziness  [] Blackouts   [] Seizures   [] History of stroke   [] History of TIA  [] Aphasia   [] Temporary blindness   [] Dysphagia   [] Weakness or numbness in arms   [] Weakness or numbness in legs Musculoskeletal:  [x] Arthritis   [] Joint swelling   [] Joint pain   [] Low back pain Hematologic:  [] Easy bruising  [] Easy bleeding   [] Hypercoagulable state   [x] Anemic  [] Hepatitis Gastrointestinal:  [] Blood in stool   [] Vomiting blood  [] Gastroesophageal reflux/heartburn   [] Difficulty swallowing. Genitourinary:  [] Chronic kidney disease   [] Difficult urination  [] Frequent urination  [] Burning with urination   [] Blood in urine Skin:  [] Rashes   [x] Ulcers   [x] Wounds Psychological:  [] History of anxiety   []  History of major depression.  Physical Examination  Vitals:   03/03/19 1959 03/04/19 0451 03/04/19 1101 03/04/19 1130  BP: (!) 101/52 (!) 94/58 95/63 95/64   Pulse: (!) 109 88 88 86  Resp:  17 17 17   Temp: 98.2 F (36.8 C) 97.6 F (36.4 C) 99.3 F (37.4 C) 99.4 F (37.4 C)  TempSrc: Oral Oral Oral Oral  SpO2: 99% 100% 100% 100%  Weight:      Height:       Body mass index is 21.02 kg/m. Gen:  WD/WN, NAD Head: [place]/AT, No  temporalis wasting.  Ear/Nose/Throat: Hearing grossly intact, nares w/o erythema or drainage, oropharynx w/o Erythema/Exudate Eyes: Sclera non-icteric, conjunctiva clear Neck: Trachea midline.  No JVD.  Pulmonary:  Good air movement, respirations not labored, equal bilaterally.  Cardiac: RRR, normal S1, S2. Vascular:  Vessel Right Left  Radial Palpable Palpable  PT 1+ Palpable Not Palpable  DP 1+ Palpable Not Palpable    Musculoskeletal: M/S 5/5 throughout.  Frankly gangrenous changes to the left great toe and the metatarsal head.  Early gangrenous changes to the second toe as well.  Foot is cool with poor capillary refill.  Mild edema. Neurologic: Sensation grossly intact in extremities.  Symmetrical.  Speech is fluent. Motor exam as listed above. Psychiatric: Judgment intact, Mood & affect appropriate for pt's clinical situation. Dermatologic: Gangrenous changes to the left foot as above.  Well-healed right TMA.       CBC Lab Results  Component Value Date   WBC 15.8 (H) 03/04/2019   HGB 6.9 (L) 03/04/2019   HCT 21.6 (L) 03/04/2019   MCV 86.4 03/04/2019   PLT 249 03/04/2019    BMET    Component Value Date/Time   NA 140 03/04/2019 0548   K 4.4 03/04/2019 0548   CL 106 03/04/2019 0548   CO2 27 03/04/2019 0548   GLUCOSE 384 (H) 03/04/2019 0548   BUN 17 03/04/2019 0548   CREATININE 0.74 03/04/2019 0548   CALCIUM 7.5 (L) 03/04/2019 0548   GFRNONAA >60 03/04/2019 0548   GFRAA >60 03/04/2019 0548   Estimated Creatinine Clearance: 75.6 mL/min (by C-G formula based on SCr of 0.74 mg/dL).  COAG Lab Results  Component Value Date   INR 1.5 (H) 02/28/2019   INR 1.13 11/11/2018   INR 1.17 09/20/2018    Radiology Ct Head Wo Contrast  Result Date: 02/28/2019 CLINICAL DATA:  63 year old male with altered mental status EXAM: CT HEAD WITHOUT CONTRAST TECHNIQUE: Contiguous axial images were obtained from the base of the skull through the vertex  without intravenous contrast. COMPARISON:  11/11/2018, 09/20/2018 FINDINGS: Brain: No acute intracranial hemorrhage. No midline shift or mass effect. Gray-white differentiation maintained. Redemonstration of mild periventricular white matter disease. Unremarkable appearance of the ventricular system. Vascular: Intracranial atherosclerosis Skull: No acute fracture.  No aggressive bone lesion identified. Sinuses/Orbits: Unremarkable appearance of the orbits. Mastoid air cells clear. No middle ear effusion. No significant sinus disease. Other: None IMPRESSION: No acute intracranial abnormality. 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   On: 02/28/2019 12:34   Dg Chest Port 1 View  Result Date: 03/02/2019 CLINICAL DATA:  Respiratory failure. EXAM: PORTABLE CHEST 1 VIEW COMPARISON:  Radiograph of Feb 28, 2019. FINDINGS: The heart size and mediastinal contours are within normal limits. Endotracheal tube has been removed. Nasogastric tube tip remains in proximal stomach. Right internal jugular catheter is unchanged. No pneumothorax or pleural effusion is noted. Both lungs are clear. The visualized skeletal structures are unremarkable. IMPRESSION: Endotracheal tube has been removed. Stable position of right internal jugular catheter and nasogastric tube. No acute cardiopulmonary abnormality seen. Aortic Atherosclerosis (ICD10-I70.0). Electronically Signed   By: Marijo Conception M.D.   On: 03/02/2019 07:36   Dg Chest Port 1 View  Result Date: 02/28/2019 CLINICAL DATA:  Central line placement, endotracheal and orogastric tube placement. EXAM: PORTABLE CHEST 1 VIEW COMPARISON:  Radiograph of November 11, 2018. FINDINGS: The heart size and mediastinal contours are within normal limits. Endotracheal and nasogastric tubes are in grossly good position. Right internal jugular catheter is noted with distal tip in expected position of the  SVC. No pneumothorax or pleural effusion is noted. Both lungs are clear. The visualized skeletal structures are unremarkable. IMPRESSION: Endotracheal and nasogastric tubes are in grossly good position. Right internal jugular catheter is in good  position. No acute cardiopulmonary abnormality seen. Electronically Signed   By: Marijo Conception M.D.   On: 02/28/2019 10:23      Assessment/Plan 1. Sepsis.  Source felt to be gangrenous foot.  Improved and stable with antibiotics.  Will ultimately require amputation to remove gangrenous tissue after revascularization 2.  PAD with gangrene left foot.  This is clearly a critical and limb threatening situation.  He has not been compliant or followed up for his follow-up visits after his original intervention 4 to 5 months ago.  We will proceed with an angiogram this afternoon.  Risks and benefits are discussed.  The patient is extremely high risk of limb loss and will clearly lose at least the left great toe and surrounding tissue.  Major amputation is expected if revascularization cannot be achieved 3.  Diabetes mellitus. blood glucose control important in reducing the progression of atherosclerotic disease. Also, involved in wound healing. On appropriate medications. 4.  HTN. Stable on outpatient medications and blood pressure control important in reducing the progression of atherosclerotic disease. On appropriate oral medications. 5.  Tobacco abuse.  Ongoing tobacco use worsens patency of interventions and likely contributed to his recurrent ischemia   Leotis Pain, MD  03/04/2019 12:20 PM    This note was created with Dragon medical transcription system.  Any error is purely unintentional

## 2019-03-04 NOTE — H&P (Signed)
[facility] History & Physical Update  The patient was interviewed and re-examined.  The patient's previous History and Physical has been reviewed and is unchanged.  There is no change in the plan of care. We plan to proceed with the scheduled procedure.  Leotis Pain, MD  03/04/2019, 12:41 PM

## 2019-03-04 NOTE — Op Note (Signed)
[facility]  Percutaneous Study/Intervention Procedural Note   Date of Surgery: 03/04/2019  Surgeon(s):Rayburn Mundis    Assistants:none  Pre-operative Diagnosis: PAD with gangrene left foot  Post-operative diagnosis:  Same  Procedure(s) Performed:             1.  Ultrasound guidance for vascular access right femoral artery             2.  Catheter placement into left common femoral artery from right femoral approach             3.  Aortogram and selective left lower extremity angiogram             4.  Percutaneous transluminal angioplasty of left anterior tibial artery with 3 mm diameter and 4 mm diameter angioplasty balloons             5.   Percutaneous transluminal angioplasty of the left SFA and popliteal arteries with 5 mm diameter Lutonix drug-coated angioplasty balloon and 6 mm diameter high-pressure angioplasty balloons  6.  Mechanical thrombectomy using the penumbra cat 6 device to the left common femoral artery, SFA, popliteal artery, and anterior tibial arteries  7.  Viabahn stent placement to the proximal left SFA with a 6 mm diameter by 7.5 cm length covered stent  8.  Upon stent placement to the left anterior tibial artery with 5 mm diameter by 10 cm length covered stent             9.  StarClose closure device right femoral artery  EBL: 200 cc  Contrast: 120 cc  Fluoro Time: 18.5 minutes  Moderate Conscious Sedation Time: approximately 60 minutes using 6 mg of Versed and 150 Mcg of Fentanyl              Indications:  Patient is a 63 y.o.male with a gangrenous foot and a known history of peripheral arterial disease with no pedal pulses. The patient is brought in for angiography for further evaluation and potential treatment.  Due to the limb threatening nature of the situation, angiogram was performed for attempted limb salvage. The patient is aware that if the procedure fails, amputation would be expected.  The patient also understands that even  with successful revascularization, amputation may still be required due to the severity of the situation.  Risks and benefits are discussed and informed consent is obtained.   Procedure:  The patient was identified and appropriate procedural time out was performed.  The patient was then placed supine on the table and prepped and draped in the usual sterile fashion. Moderate conscious sedation was administered during a face to face encounter with the patient throughout the procedure with my supervision of the RN administering medicines and monitoring the patient's vital signs, pulse oximetry, telemetry and mental status throughout from the start of the procedure until the patient was taken to the recovery room. Ultrasound was used to evaluate the right common femoral artery.  It was patent .  A digital ultrasound image was acquired.  A Seldinger needle was used to access the right common femoral artery under direct ultrasound guidance and a permanent image was performed.  A 0.035 J wire was advanced without resistance and a 5Fr sheath was placed.  Pigtail catheter was placed into the aorta and an AP aortogram was performed. This demonstrated mild stenosis of the left renal artery and no significant stenosis of the right renal artery.  The aorta and iliac arteries were patent with only mild  stenosis in the iliac arteries. I then crossed the aortic bifurcation and advanced to the left femoral head in the left common femoral artery. Selective left lower extremity angiogram was then performed. This demonstrated flush occlusion of the left SFA with what appeared to be thrombus at the femoral bifurcation partially impinging on the profunda femoris artery as well.  Very sluggish runoff was seen and only a faint reconstitution of a diseased anterior tibial artery was seen distally. It was felt that it was in the patient's best interest to proceed with intervention after these images to avoid a second procedure and a larger  amount of contrast and fluoroscopy based off of the findings from the initial angiogram. The patient was systemically heparinized and a 6 French donation sheath was then placed over the Terumo Advantage wire. I then used a Kumpe catheter and the advantage wire to navigate through the occlusion with minimal difficulty and down into the anterior tibial artery.  I then exchanged for a V 18 wire and proceeded with treatment.  A 3 mm diameter by 22 cm length angioplasty balloon was inflated from the mid anterior tibial artery up through its origin and inflated to 12 atm for 1 minute.  A 5 mm diameter by 30 cm length Lutonix drug-coated angioplasty balloon was then used to treat the popliteal artery and the SFA with 2 inflations of 10 to 12 atm for 1 minute.  Imaging following this showed residual thrombus and no flow distally and continued thrombus at the femoral bifurcation.  The penumbra cat 6 device was then brought onto the field and multiple passes with the mechanical thrombectomy device were performed throughout the left SFA, common femoral artery, popliteal artery, and anterior tibial artery with minimal improvement seen.  I then placed a covered stent up to the origin of the superficial femoral artery to encompass and entrap the thrombus near the origin of the SFA.  This was a 6 mm diameter by 7.5 cm length stent.  I used a 6 mm diameter high-pressure angioplasty balloon to post dilate this as well as to treat the segment in the Hunter's canal region that was narrowed even after a 5 mm angioplasty.  There was still very poor runoff distally.  With a catheter in the popliteal artery there was near occlusive stenosis and thrombus in the proximal anterior tibial artery.  Another pass with the penumbra cat 6 device as well as upsizing to a 4 mm diameter angioplasty balloon inflated to 8 atm for 1 minute were performed with residual high-grade stenosis in the proximal anterior tibial artery.  At this point, really our  only option was to place a covered stent over this area to cover the thrombus and hopefully open some flow.  A 5 mm diameter by 10 cm length via bond stent was deployed in the proximal anterior tibial artery and postdilated with a 4 mm balloon.  Although there was not obvious residual stenosis, the flow remained extremely sluggish with injections through the sheath.  At this point, he will likely need some sort of femoral endarterectomy as there remained limitation from thrombus and hyperplasia at the femoral bifurcation both of the profunda femoris artery and at the origin of the SFA and common femoral artery.  No further endovascular options really exist at this point. I elected to terminate the procedure. The sheath was removed and StarClose closure device was deployed in the right femoral artery with excellent hemostatic result. The patient was taken to the recovery room  in stable condition having tolerated the procedure well.  Findings:               Aortogram:  This demonstrated mild stenosis of the left renal artery and no significant stenosis of the right renal artery.  The aorta and iliac arteries were patent with only mild stenosis in the iliac arteries             Left lower Extremity:  This demonstrated flush occlusion of the left SFA with what appeared to be thrombus at the femoral bifurcation partially impinging on the profunda femoris artery as well.  Very sluggish runoff was seen and only a faint reconstitution of a diseased anterior tibial artery was seen distally   Disposition: Patient was taken to the recovery room in stable condition having tolerated the procedure well.  Complications: None  Leotis Pain 03/04/2019 2:43 PM   This note was created with Dragon Medical transcription system. Any errors in dictation are purely unintentional.

## 2019-03-04 NOTE — Progress Notes (Signed)
Inpatient Diabetes Program Recommendations  AACE/ADA: New Consensus Statement on Inpatient Glycemic Control (2015)  Target Ranges:  Prepandial:   less than 140 mg/dL      Peak postprandial:   less than 180 mg/dL (1-2 hours)      Critically ill patients:  140 - 180 mg/dL   Lab Results  Component Value Date   GLUCAP 360 (H) 03/04/2019   HGBA1C >15.5 (H) 02/28/2019    Review of Glycemic Control Results for Richard [name], Richard [name] (MRN [id number]) as of 03/04/2019 10:19  Ref. Range 03/03/2019 07:30 03/03/2019 11:33 03/03/2019 18:36 03/03/2019 21:45 03/04/2019 07:35  Glucose-Capillary Latest Ref Range: 70 - 99 mg/dL 197 (H) 210 (H) 139 (H) 164 (H) 360 (H)  Outpatient Diabetes medications: Levemir 17 units daily, Novolog 5 units TID with meals, Novolog 0-5 units QHS Current orders for Inpatient glycemic control: Lantus 5 units daily, Novolog 0-15 units TID with meals Inpatient Diabetes Program Recommendations:  Note that fasting CBG=360 mg/dL.  Please increase Lantus to 10 units daily.   Also, while NPO consider changing Novolog correction to sensitive q 4 hours (instead of AC and HS).  Thanks,  Adah Perl, RN, BC-ADM Inpatient Diabetes Coordinator Pager [phone] (8a-5p)

## 2019-03-04 NOTE — Progress Notes (Signed)
Patient transported to [facility] for procedure with blood transfusion infusing.  Clarise Cruz, BSN

## 2019-03-04 NOTE — Progress Notes (Signed)
Dr. Leslye Peer was informed of patient's CBG of 41. CBG was obtained twice to confirm. Per Dr. Leslye Peer give two amps of D50 wait a few minutes and call back if needed.

## 2019-03-04 NOTE — Progress Notes (Signed)
Patient ID: Richard [name], male   DOB: 20-Jun-1956, 63 y.o.   MRN: [id number]  [employer] [facility] PROGRESS NOTE  Javar Eshbach ULA:[id number] DOB: December 03, 1955 DOA: 02/28/2019 PCP: [facility] [place]  HPI/Subjective: I was called in the recovery area from the angiogram because he was unresponsive.  Fingerstick was 41-week post 2 Amp of D50.  Patient aroused a little bit but still lethargic.  Objective: Vitals:   03/04/19 1500 03/04/19 1515  BP: 100/68 99/67  Pulse: 81 75  Resp: 11 (!) 9  Temp:    SpO2: 100% 100%    Filed Weights   02/28/19 1443 03/01/19 0416 03/04/19 1248  Weight: 55 kg 57.3 kg 57.3 kg    ROS: Review of Systems  Unable to perform ROS: Acuity of condition   Exam: Physical Exam  Constitutional: He appears lethargic.  HENT:  Nose: No mucosal edema.  Mouth/Throat: No oropharyngeal exudate or posterior oropharyngeal edema.  Eyes: Conjunctivae and lids are normal. Left pupil is not reactive.  Neck: No JVD present. Carotid bruit is not present. No edema present. No thyroid mass and no thyromegaly present.  Cardiovascular: S1 normal and S2 normal. Exam reveals no gallop.  No murmur heard. Respiratory: No respiratory distress. He has no wheezes. He has no rhonchi. He has no rales.  GI: Soft. Bowel sounds are normal. There is no abdominal tenderness.  Musculoskeletal:     Right ankle: He exhibits no swelling.     Left ankle: He exhibits no swelling.     Comments: Gangrenous left first and second toe  Lymphadenopathy:    He has no cervical adenopathy.  Neurological: He appears lethargic.  Skin: Skin is warm. Nails show no clubbing.  Left first and second toe gangrenous.  Prior amputation of the right trans-met.  Psychiatric:  Lethargic      Data Reviewed: Basic Metabolic Panel: Recent Labs  Lab 02/28/19 2038  03/01/19 0404 03/01/19 2122 03/02/19 0423 03/03/19 0521 03/04/19 0548  NA 147*   < > 154* 153* 154* 154* 140  K 3.5   < > 3.2*  3.2* 3.4* 3.7 4.4  CL 110   < > 114* 115* 120* 118* 106  CO2 15*   < > _0 GLUCOSE 425*   < > 111* 180* 167* 222* 384*  BUN 83*   < > 86* 86* 46* 24* 17  CREATININE 2.69*   < > 1.88* 1.80* 0.86 0.74 0.74  CALCIUM 7.7*   < > 7.7* 7.6* 7.9* 8.0* 7.5*  MG 2.2  --   --  2.1  --   --   --   PHOS 3.8  --   --  1.7*  --   --   --    < > = values in this interval not displayed.   Liver Function Tests: Recent Labs  Lab 02/28/19 0948 03/02/19 0423 03/04/19 0548  AST 23 45* 26  ALT _1 ALKPHOS 40 30* 35*  BILITOT 2.1* 0.6 0.5  PROT 5.9* 6.0* 5.6*  ALBUMIN 2.3* 2.5* 2.3*   Recent Labs  Lab 02/28/19 0948  LIPASE 27   CBC: Recent Labs  Lab 02/28/19 0948 02/28/19 2038 03/01/19 0404 03/02/19 0423 03/03/19 0521 03/03/19 1823 03/04/19 0548  WBC 22.4* 18.8* 16.9* 20.2* 17.7*  --  15.8*  NEUTROABS 18.0*  --   --   --   --   --   --   HGB 7.7* 7.9* 7.2* 6.8* 6.4* 7.3*  6.9*  HCT 26.9* 25.5* 22.2* 20.8* 20.5* 22.9* 21.6*  MCV 94.7 86.1 81.6 82.2 86.1  --  86.4  PLT 417* 370 331 314 310  --  249   Cardiac Enzymes: Recent Labs  Lab 02/28/19 0948 02/28/19 1550 02/28/19 1831 03/01/19 0038 03/01/19 0724  TROPONINI 2.31* 2.71* 4.13* 10.66* 14.28*    CBG: Recent Labs  Lab 03/04/19 1139 03/04/19 1450 03/04/19 1453 03/04/19 1504 03/04/19 1506  GLUCAP 155* 41* 41* 295* 282*    Recent Results (from the past 240 hour(s))  Blood Culture (routine x 2)     Status: Abnormal   Collection Time: 02/28/19  9:48 AM  Result Value Ref Range Status   Specimen Description   Final    BLOOD RIGHT ANTECUBITAL Performed at [facility], [address], [place], [place] [address]    Special Requests   Final    BOTTLES DRAWN AEROBIC AND ANAEROBIC Blood Culture results may not be optimal due to an excessive volume of blood received in culture bottles Performed at [facility], [address] [place], [place] [address]    Culture (A)  Final    PROTEUS  VULGARIS CRITICAL RESULT CALLED TO, READ BACK BY AND VERIFIED WITH:  PHARMD ASAJAH D [id number] FCP GROWTH RECOVERED FROM AEROBIC BOTTLE ONLY. Performed at [facility], [place] [address], [place], [place] [address]    Report Status 03/03/2019 FINAL  Final   Organism ID, Bacteria PROTEUS VULGARIS  Final      Susceptibility   Proteus vulgaris - MIC*    AMPICILLIN >=32 RESISTANT Resistant     CEFAZOLIN >=64 RESISTANT Resistant     CEFEPIME <=1 SENSITIVE Sensitive     CEFTAZIDIME <=1 SENSITIVE Sensitive     CEFTRIAXONE <=1 SENSITIVE Sensitive     CIPROFLOXACIN <=0.25 SENSITIVE Sensitive     GENTAMICIN <=1 SENSITIVE Sensitive     IMIPENEM 2 SENSITIVE Sensitive     TRIMETH/SULFA <=20 SENSITIVE Sensitive     AMPICILLIN/SULBACTAM 4 SENSITIVE Sensitive     PIP/TAZO <=4 SENSITIVE Sensitive     * PROTEUS VULGARIS  Blood Culture (routine x 2)     Status: None (Preliminary result)   Collection Time: 02/28/19  9:48 AM  Result Value Ref Range Status   Specimen Description BLOOD RIGHT ANTECUBITAL  Final   Special Requests   Final    BOTTLES DRAWN AEROBIC AND ANAEROBIC Blood Culture adequate volume   Culture   Final    NO GROWTH 4 DAYS Performed at [facility], [address], [place], [place] [address]    Report Status PENDING  Incomplete  Urine culture     Status: None   Collection Time: 02/28/19  9:48 AM  Result Value Ref Range Status   Specimen Description   Final    URINE, RANDOM Performed at [facility], [address], [place], [place] [address]    Special Requests   Final    NONE Performed at [facility], [address], [place], [place] [address]    Culture   Final    NO GROWTH Performed at [facility], [place] [address], [place],  [address]    Report Status 03/01/2019 FINAL  Final  SARS Coronavirus 2 (CEPHEID- Performed in [place] hospital lab), Hosp Order     Status: None   Collection Time: 02/28/19  9:48 AM  Result  Value Ref Range Status   SARS Coronavirus 2 NEGATIVE NEGATIVE Final  Comment: (NOTE) If result is NEGATIVE SARS-CoV-2 target nucleic acids are NOT DETECTED. The SARS-CoV-2 RNA is generally detectable in upper and lower  respiratory specimens during the acute phase of infection. The lowest  concentration of SARS-CoV-2 viral copies this assay can detect is 250  copies / mL. A negative result does not preclude SARS-CoV-2 infection  and should not be used as the sole basis for treatment or other  patient management decisions.  A negative result may occur with  improper specimen collection / handling, submission of specimen other  than nasopharyngeal swab, presence of viral mutation(s) within the  areas targeted by this assay, and inadequate number of viral copies  (<250 copies / mL). A negative result must be combined with clinical  observations, patient history, and epidemiological information. If result is POSITIVE SARS-CoV-2 target nucleic acids are DETECTED. The SARS-CoV-2 RNA is generally detectable in upper and lower  respiratory specimens dur ing the acute phase of infection.  Positive  results are indicative of active infection with SARS-CoV-2.  Clinical  correlation with patient history and other diagnostic information is  necessary to determine patient infection status.  Positive results do  not rule out bacterial infection or co-infection with other viruses. If result is PRESUMPTIVE POSTIVE SARS-CoV-2 nucleic acids MAY BE PRESENT.   A presumptive positive result was obtained on the submitted specimen  and confirmed on repeat testing.  While 2019 novel coronavirus  (SARS-CoV-2) nucleic acids may be present in the submitted sample  additional confirmatory testing may be necessary for epidemiological  and / or clinical management purposes  to differentiate between  SARS-CoV-2 and other Sarbecovirus currently known to infect humans.  If clinically indicated additional testing  with an alternate test  methodology [phone]) is advised. The SARS-CoV-2 RNA is generally  detectable in upper and lower respiratory sp ecimens during the acute  phase of infection. The expected result is Negative. Fact Sheet for Patients:  StrictlyIdeas.no Fact Sheet for Healthcare Providers: BankingDealers.co.za This test is not yet approved or cleared by the [place] FDA and has been authorized for detection and/or diagnosis of SARS-CoV-2 by FDA under an Emergency Use Authorization (EUA).  This EUA will remain in effect (meaning this test can be used) for the duration of the COVID-19 declaration under Section 564(b)(1) of the Act, 21 U.S.C. section 360bbb-3(b)(1), unless the authorization is terminated or revoked sooner. Performed at [facility], [address] [place], [place] [address]   MRSA PCR Screening     Status: None   Collection Time: 02/28/19  2:48 PM  Result Value Ref Range Status   MRSA by PCR NEGATIVE NEGATIVE Final    Comment:        The GeneXpert MRSA Assay (FDA approved for NASAL specimens only), is one component of a comprehensive MRSA colonization surveillance program. It is not intended to diagnose MRSA infection nor to guide or monitor treatment for MRSA infections. Performed at [facility], [address] [place], [place] [address]      Studies: No results found.  Scheduled Meds: . [MAR Hold] aspirin  81 mg Oral Daily  . [MAR Hold] enoxaparin (LOVENOX) injection  40 mg Subcutaneous Q24H  . [MAR Hold] feeding supplement (ENSURE ENLIVE)  237 mL Oral TID BM  . fentaNYL      . fentaNYL      . heparin      . lidocaine-EPINEPHrine      . [MAR Hold] mouth rinse  15 mL Mouth Rinse BID  .  midazolam      . midazolam       Continuous Infusions: . sodium chloride 1,000 mL (03/04/19 1251)  .  ceFAZolin (ANCEF) IV    . [MAR Hold] cefTRIAXone (ROCEPHIN)  IV Stopped (03/03/19  2027)  . dextrose      Assessment/Plan:  1.  Hypoglycemia with acute metabolic encephalopathy.  Patient given 2 Amp of D50 and still little lethargic.  Nursing staff was hesitant about giving him Narcan at this point because they do not want him to move around because of the right groin.  They will continue to watch him a little bit closely at this point in the recovery area.  Start D5 drip at low dose.  Check fingersticks every 2 hours.  Patient received 200 mg of fentanyl so it may take some time for him to come around.   Code Status:     Code Status Orders  (From admission, onward)         Start     Ordered   03/02/19 1454  Full code  Continuous     03/02/19 1454        Code Status History    Date Active Date Inactive Code Status Order ID Comments User Context   02/28/2019 1449 03/01/2019 0836 Full Code [id number]  Gorden Harms, MD Inpatient   02/28/2019 1449 02/28/2019 1449 Full Code [id number]  Gorden Harms, MD Inpatient   11/11/2018 0758 11/18/2018 1814 DNR [id number]  Loletha Grayer, MD ED   09/20/2018 1635 09/24/2018 1854 Full Code [id number]  Fritzi Mandes, MD ED   08/03/2018 1020 08/04/2018 1813 Full Code [id number]  Salary, Avel Peace, MD ED   01/09/2018 1025 01/11/2018 1932 Full Code [id number]  Harrie Foreman, MD Inpatient   09/05/2017 1559 09/11/2017 1928 Full Code [id number]  Fritzi Mandes, MD Inpatient   08/24/2017 0238 08/29/2017 2131 Full Code [id number]  Lance Coon, MD Inpatient   08/10/2015 0822 08/11/2015 1947 Full Code [id number]  Harrie Foreman, MD Inpatient     Disposition Plan: TBD  Consultants:  Critical care specialist  Cardiology  Palliative care  Vascular surgery  Podiatry  Antibiotics:  Rocephin  Time spent: Another 30 minutes  [place]

## 2019-03-04 NOTE — Progress Notes (Signed)
Dr. Leslye Peer at the bedside to assess patient.

## 2019-03-04 NOTE — Progress Notes (Signed)
PT Cancellation Note  Patient Details Name: Richard [name] MRN: [id number] DOB: Jul 04, 1956   Cancelled Treatment:    Reason Eval/Treat Not Completed: Patient not medically ready Patient been away at procedure all morning, blood transfusion  Shelton Silvas PT, DPT  Shelton Silvas 03/04/2019, 1:48 PM

## 2019-03-04 NOTE — Progress Notes (Signed)
SUBJECTIVE: Mild chest pain reported, mild shortness of breath.   Vitals:   03/04/19 1405 03/04/19 1410 03/04/19 1415 03/04/19 1445  BP: 91/64 97/68 105/67 91/63  Pulse: 81 81 79 80  Resp: 15 12 17 17   Temp:      TempSrc:      SpO2: 100% 100% 100% 96%  Weight:      Height:        Intake/Output Summary (Last 24 hours) at 03/04/2019 1459 Last data filed at 03/04/2019 1223 Gross per 24 hour  Intake 2039.75 ml  Output 1200 ml  Net 839.75 ml    LABS: Basic Metabolic Panel: Recent Labs    03/03/19 0521 03/04/19 0548  NA 154* 140  K 3.7 4.4  CL 118* 106  CO2 29 27  GLUCOSE 222* 384*  BUN 24* 17  CREATININE 0.74 0.74  CALCIUM 8.0* 7.5*   Liver Function Tests: Recent Labs    03/02/19 0423 03/04/19 0548  AST 45* 26  ALT 16 14  ALKPHOS 30* 35*  BILITOT 0.6 0.5  PROT 6.0* 5.6*  ALBUMIN 2.5* 2.3*   No results for input(s): LIPASE, AMYLASE in the last 72 hours. CBC: Recent Labs    03/03/19 0521 03/03/19 1823 03/04/19 0548  WBC 17.7*  --  15.8*  HGB 6.4* 7.3* 6.9*  HCT 20.5* 22.9* 21.6*  MCV 86.1  --  86.4  PLT 310  --  249   Cardiac Enzymes: No results for input(s): CKTOTAL, CKMB, CKMBINDEX, TROPONINI in the last 72 hours. BNP: Invalid input(s): POCBNP D-Dimer: No results for input(s): DDIMER in the last 72 hours. Hemoglobin A1C: No results for input(s): HGBA1C in the last 72 hours. Fasting Lipid Panel: No results for input(s): CHOL, HDL, LDLCALC, TRIG, CHOLHDL, LDLDIRECT in the last 72 hours. Thyroid Function Tests: Recent Labs    03/02/19 0423  TSH 0.387   Anemia Panel: No results for input(s): VITAMINB12, FOLATE, FERRITIN, TIBC, IRON, RETICCTPCT in the last 72 hours.   PHYSICAL EXAM General: Cachetic, lethargic HEENT:  Normocephalic and atramatic Neck:  No JVD.  Lungs: Clear bilaterally to auscultation and percussion. Heart: HRRR . Normal S1 and S2 without gallops or murmurs.  Abdomen: Bowel sounds are positive, abdomen soft and non-tender   Msk:  Back normal, normal gait. Normal strength and tone for age. Extremities: Amputation of right toes foot   Neuro: Alert and oriented X 3. Psych:  Flat affect  TELEMETRY: NSR 81bpm  ASSESSMENT AND PLAN: Status post STEMI, medical treatment recommended due to poor prognosis, history of pancreatic cancer and palliative care recommended.   Afib: May restart Eliquis after vascular procedures are completed if further surgical intervention is not planned.   Active Problems:   Respiratory failure ([place])    Jake Bathe, NP-C 03/04/2019 2:59 PM Cell: [phone]

## 2019-03-04 NOTE — Progress Notes (Signed)
Patient ID: Richard [name], male   DOB: [birth year], 63 y.o.   MRN: [id number]  [facility] PROGRESS NOTE  Ras Kollman JSE:[id number] DOB: 05-27-1956 DOA: 02/28/2019 PCP: [facility] [place]  HPI/Subjective: Patient more alert today.  Able to answer more questions and follow commands.  Patient not hungry and can eat very much.  Feels a little nauseous.  Objective: Vitals:   03/04/19 1101 03/04/19 1130  BP: 95/63 95/64  Pulse: 88 86  Resp: 17 17  Temp: 99.3 F (37.4 C) 99.4 F (37.4 C)  SpO2: 100% 100%    Filed Weights   02/28/19 1040 02/28/19 1443 03/01/19 0416  Weight: 54 kg 55 kg 57.3 kg    ROS: Review of Systems  Constitutional: Negative for chills and fever.  Eyes: Negative for blurred vision.  Respiratory: Negative for cough and shortness of breath.   Cardiovascular: Negative for chest pain.  Gastrointestinal: Positive for nausea. Negative for abdominal pain, constipation, diarrhea and vomiting.  Genitourinary: Negative for dysuria.  Musculoskeletal: Positive for joint pain.  Neurological: Negative for dizziness and headaches.   Exam: Physical Exam  HENT:  Nose: No mucosal edema.  Mouth/Throat: No oropharyngeal exudate or posterior oropharyngeal edema.  Eyes: Pupils are equal, round, and reactive to light. Conjunctivae, EOM and lids are normal.  Neck: No JVD present. Carotid bruit is not present. No edema present. No thyroid mass and no thyromegaly present.  Cardiovascular: S1 normal and S2 normal. Exam reveals no gallop.  No murmur heard. Respiratory: No respiratory distress. He has no wheezes. He has no rhonchi. He has no rales.  GI: Soft. Bowel sounds are normal. There is no abdominal tenderness.  Musculoskeletal:     Right ankle: He exhibits no swelling.     Left ankle: He exhibits no swelling.  Lymphadenopathy:    He has no cervical adenopathy.  Neurological: He is alert.  Moved his arms on his own.  Skin: Skin is warm. Nails show no  clubbing.  Left first toe gangrenous.  Prior amputation of the right trans-met.  Psychiatric: His affect is blunt.      Data Reviewed: Basic Metabolic Panel: Recent Labs  Lab 02/28/19 2038  03/01/19 0404 03/01/19 0724 03/02/19 0423 03/03/19 0521 03/04/19 0548  NA 147*   < > 154* 153* 154* 154* 140  K 3.5   < > 3.2* 3.2* 3.4* 3.7 4.4  CL 110   < > 114* 115* 120* 118* 106  CO2 15*   < > _0 GLUCOSE 425*   < > 111* 180* 167* 222* 384*  BUN 83*   < > 86* 86* 46* 24* 17  CREATININE 2.69*   < > 1.88* 1.80* 0.86 0.74 0.74  CALCIUM 7.7*   < > 7.7* 7.6* 7.9* 8.0* 7.5*  MG 2.2  --   --  2.1  --   --   --   PHOS 3.8  --   --  1.7*  --   --   --    < > = values in this interval not displayed.   Liver Function Tests: Recent Labs  Lab 02/28/19 0948 03/02/19 0423 03/04/19 0548  AST 23 45* 26  ALT _1 ALKPHOS 40 30* 35*  BILITOT 2.1* 0.6 0.5  PROT 5.9* 6.0* 5.6*  ALBUMIN 2.3* 2.5* 2.3*   Recent Labs  Lab 02/28/19 0948  LIPASE 27   CBC: Recent Labs  Lab 02/28/19 0948 02/28/19 2038 03/01/19 0404 03/02/19  0423 03/03/19 0521 03/03/19 1823 03/04/19 0548  WBC 22.4* 18.8* 16.9* 20.2* 17.7*  --  15.8*  NEUTROABS 18.0*  --   --   --   --   --   --   HGB 7.7* 7.9* 7.2* 6.8* 6.4* 7.3* 6.9*  HCT 26.9* 25.5* 22.2* 20.8* 20.5* 22.9* 21.6*  MCV 94.7 86.1 81.6 82.2 86.1  --  86.4  PLT 417* 370 331 314 310  --  249   Cardiac Enzymes: Recent Labs  Lab 02/28/19 0948 02/28/19 1550 02/28/19 1831 03/01/19 0038 03/01/19 0724  TROPONINI 2.31* 2.71* 4.13* 10.66* 14.28*    CBG: Recent Labs  Lab 03/03/19 1133 03/03/19 1836 03/03/19 2145 03/04/19 0735 03/04/19 1139  GLUCAP 210* 139* 164* 360* 155*    Recent Results (from the past 240 hour(s))  Blood Culture (routine x 2)     Status: Abnormal   Collection Time: 02/28/19  9:48 AM  Result Value Ref Range Status   Specimen Description   Final    BLOOD RIGHT ANTECUBITAL Performed at [facility],  [address] [place], [place] [address]    Special Requests   Final    BOTTLES DRAWN AEROBIC AND ANAEROBIC Blood Culture results may not be optimal due to an excessive volume of blood received in culture bottles Performed at [facility], [address] [place], [place] [address]    Culture (A)  Final    PROTEUS VULGARIS CRITICAL RESULT CALLED TO, READ BACK BY AND VERIFIED WITH:  PHARMD ASAJAH D [id number] FCP GROWTH RECOVERED FROM AEROBIC BOTTLE ONLY. Performed at [facility], [place] [address], [place], [place] [address]    Report Status 03/03/2019 FINAL  Final   Organism ID, Bacteria PROTEUS VULGARIS  Final      Susceptibility   Proteus vulgaris - MIC*    AMPICILLIN >=32 RESISTANT Resistant     CEFAZOLIN >=64 RESISTANT Resistant     CEFEPIME <=1 SENSITIVE Sensitive     CEFTAZIDIME <=1 SENSITIVE Sensitive     CEFTRIAXONE <=1 SENSITIVE Sensitive     CIPROFLOXACIN <=0.25 SENSITIVE Sensitive     GENTAMICIN <=1 SENSITIVE Sensitive     IMIPENEM 2 SENSITIVE Sensitive     TRIMETH/SULFA <=20 SENSITIVE Sensitive     AMPICILLIN/SULBACTAM 4 SENSITIVE Sensitive     PIP/TAZO <=4 SENSITIVE Sensitive     * PROTEUS VULGARIS  Blood Culture (routine x 2)     Status: None (Preliminary result)   Collection Time: 02/28/19  9:48 AM  Result Value Ref Range Status   Specimen Description BLOOD RIGHT ANTECUBITAL  Final   Special Requests   Final    BOTTLES DRAWN AEROBIC AND ANAEROBIC Blood Culture adequate volume   Culture   Final    NO GROWTH 4 DAYS Performed at [facility], [address], [place], [place] [address]    Report Status PENDING  Incomplete  Urine culture     Status: None   Collection Time: 02/28/19  9:48 AM  Result Value Ref Range Status   Specimen Description   Final    URINE, RANDOM Performed at [facility], [address] [place], [place] [address]    Special Requests   Final    NONE Performed at [facility], [address], [place], [place] [address]    Culture   Final    NO GROWTH Performed at [facility], [place] [address], [place], [place] [address]    Report Status  03/01/2019 FINAL  Final  SARS Coronavirus 2 (CEPHEID- Performed in [facility] hospital lab), Hosp Order     Status: None   Collection Time: 02/28/19  9:48 AM  Result Value Ref Range Status   SARS Coronavirus 2 NEGATIVE NEGATIVE Final    Comment: (NOTE) If result is NEGATIVE SARS-CoV-2 target nucleic acids are NOT DETECTED. The SARS-CoV-2 RNA is generally detectable in upper and lower  respiratory specimens during the acute phase of infection. The lowest  concentration of SARS-CoV-2 viral copies this assay can detect is 250  copies / mL. A negative result does not preclude SARS-CoV-2 infection  and should not be used as the sole basis for treatment or other  patient management decisions.  A negative result may occur with  improper specimen collection / handling, submission of specimen other  than nasopharyngeal swab, presence of viral mutation(s) within the  areas targeted by this assay, and inadequate number of viral copies  (<250 copies / mL). A negative result must be combined with clinical  observations, patient history, and epidemiological information. If result is POSITIVE SARS-CoV-2 target nucleic acids are DETECTED. The SARS-CoV-2 RNA is generally detectable in upper and lower  respiratory specimens dur ing the acute phase of infection.  Positive  results are indicative of active infection with SARS-CoV-2.  Clinical  correlation with patient history and other diagnostic information is  necessary to determine patient infection status.  Positive results do  not rule out bacterial infection or co-infection with other viruses. If result is PRESUMPTIVE POSTIVE SARS-CoV-2 nucleic acids MAY BE PRESENT.   A presumptive positive result was obtained on the submitted specimen  and confirmed on repeat testing.  While 2019 novel  coronavirus  (SARS-CoV-2) nucleic acids may be present in the submitted sample  additional confirmatory testing may be necessary for epidemiological  and / or clinical management purposes  to differentiate between  SARS-CoV-2 and other Sarbecovirus currently known to infect humans.  If clinically indicated additional testing with an alternate test  methodology [phone]) is advised. The SARS-CoV-2 RNA is generally  detectable in upper and lower respiratory sp ecimens during the acute  phase of infection. The expected result is Negative. Fact Sheet for Patients:  StrictlyIdeas.no Fact Sheet for Healthcare Providers: BankingDealers.co.za This test is not yet approved or cleared by the [place] FDA and has been authorized for detection and/or diagnosis of SARS-CoV-2 by FDA under an Emergency Use Authorization (EUA).  This EUA will remain in effect (meaning this test can be used) for the duration of the COVID-19 declaration under Section 564(b)(1) of the Act, 21 U.S.C. section 360bbb-3(b)(1), unless the authorization is terminated or revoked sooner. Performed at [facility], [address] [place], [place] [address]   MRSA PCR Screening     Status: None   Collection Time: 02/28/19  2:48 PM  Result Value Ref Range Status   MRSA by PCR NEGATIVE NEGATIVE Final    Comment:        The GeneXpert MRSA Assay (FDA approved for NASAL specimens only), is one component of a comprehensive MRSA colonization surveillance program. It is not intended to diagnose MRSA infection nor to guide or monitor treatment for MRSA infections. Performed at [facility], [address] [place], [place] [address]      Studies: No results found.  Scheduled Meds: . lidocaine-EPINEPHrine      . [MAR Hold] aspirin  81 mg Oral Daily  . [MAR Hold] enoxaparin (LOVENOX) injection  40 mg Subcutaneous  Q24H  . [MAR Hold] feeding supplement  (ENSURE ENLIVE)  237 mL Oral TID BM  . [MAR Hold] insulin aspart  0-15 Units Subcutaneous TID WC  . [MAR Hold] insulin glargine  5 Units Subcutaneous Daily  . [MAR Hold] mouth rinse  15 mL Mouth Rinse BID   Continuous Infusions: . sodium chloride    . ceFAZolin    .  ceFAZolin (ANCEF) IV    . [MAR Hold] cefTRIAXone (ROCEPHIN)  IV Stopped (03/03/19 2027)    Assessment/Plan:  1. Sepsis with Proteus vulgaris.  Likely the gangrenous toe as the source.  On Rocephin.  Vascular surgery to do an angiogram today to check out his blood supply.  Patient will likely need an amputation by podiatry at some point.   2. Acute hypoxic respiratory failure.  Taper oxygen to off. 3. Acute myocardial infarction.  Conservative management as per cardiology.  Heparin drip stopped.  Patient started on aspirin.  Blood pressure too low for other medications at this point. 4. Acute metabolic encephalopathy.  Likely from sepsis but drug use could be contributing.  Patient's mental status is better today than it has been during the entire hospitalization. 5. Acute kidney injury.  Creatinine has normalized with IV fluids 6. Cocaine abuse 7. Chronic atrial fibrillation.  Patient states that he does take the Eliquis at home.  Heparin drip was stopped yesterday.  May have to restart anticoagulation after amputation done. 8. History of pancreatic cancer status post Whipple procedure in 2018 9. Diabetic ketoacidosis on presentation.  On sliding scale and Lantus insulin low-dose 10. Appreciate palliative care consultation. 11. Hypernatremia.  Normalized.  Stopped IV fluids today. 12. Anemia.  Hemoglobin today 6.9.  Not much of a response from yesterday's transfusion.  Transfuse another unit of packed red blood cells today.  Code Status:     Code Status Orders  (From admission, onward)         Start     Ordered   03/02/19 1454  Full code  Continuous     03/02/19 1454        Code Status History    Date Active  Date Inactive Code Status Order ID Comments User Context   02/28/2019 1449 03/01/2019 0836 Full Code [id number]  Gorden Harms, MD Inpatient   02/28/2019 1449 02/28/2019 1449 Full Code [id number]  Gorden Harms, MD Inpatient   11/11/2018 0758 11/18/2018 1814 DNR [id number]  Loletha Grayer, MD ED   09/20/2018 1635 09/24/2018 1854 Full Code [id number]  Fritzi Mandes, MD ED   08/03/2018 1020 08/04/2018 1813 Full Code [id number]  Salary, Avel Peace, MD ED   01/09/2018 1025 01/11/2018 1932 Full Code [id number]  Harrie Foreman, MD Inpatient   09/05/2017 1559 09/11/2017 1928 Full Code [id number]  Fritzi Mandes, MD Inpatient   08/24/2017 0238 08/29/2017 2131 Full Code [id number]  Lance Coon, MD Inpatient   08/10/2015 0822 08/11/2015 1947 Full Code [id number]  Harrie Foreman, MD Inpatient     Family Communication: Spoke with significant other Disposition Plan: TBD  Consultants:  Critical care specialist  Cardiology  Palliative care  Vascular surgery  Podiatry  Antibiotics:  Rocephin  Time spent: 28 minutes.   Asiah Befort [employer]

## 2019-03-05 ENCOUNTER — Encounter: Payer: Self-pay | Admitting: Vascular Surgery

## 2019-03-05 DIAGNOSIS — M79672 Pain in left foot: Secondary | ICD-10-CM

## 2019-03-05 DIAGNOSIS — Z95820 Peripheral vascular angioplasty status with implants and grafts: Secondary | ICD-10-CM

## 2019-03-05 DIAGNOSIS — I96 Gangrene, not elsewhere classified: Secondary | ICD-10-CM

## 2019-03-05 LAB — GLUCOSE, CAPILLARY
Glucose-Capillary: 41 mg/dL — CL (ref 70–99)
Glucose-Capillary: 298 mg/dL — ABNORMAL HIGH (ref 70–99)
Glucose-Capillary: 157 mg/dL — ABNORMAL HIGH (ref 70–99)
Glucose-Capillary: 212 mg/dL — ABNORMAL HIGH (ref 70–99)
Glucose-Capillary: 253 mg/dL — ABNORMAL HIGH (ref 70–99)
Glucose-Capillary: 218 mg/dL — ABNORMAL HIGH (ref 70–99)
Glucose-Capillary: 204 mg/dL — ABNORMAL HIGH (ref 70–99)

## 2019-03-05 LAB — TYPE AND SCREEN
ABO/RH(D): O POS
Antibody Screen: NEGATIVE
Unit division: 0
Unit division: 0

## 2019-03-05 LAB — BPAM RBC
Blood Product Expiration Date: 202005262359
Blood Product Expiration Date: 202006052359
ISSUE DATE / TIME: 202005121223
ISSUE DATE / TIME: 202005131110
Unit Type and Rh: 5100
Unit Type and Rh: 5100

## 2019-03-05 LAB — CBC
HCT: 23.3 % — ABNORMAL LOW (ref 39.0–52.0)
Hemoglobin: 7.7 g/dL — ABNORMAL LOW (ref 13.0–17.0)
MCH: 28.3 pg (ref 26.0–34.0)
MCHC: 33 g/dL (ref 30.0–36.0)
MCV: 85.7 fL (ref 80.0–100.0)
Platelets: 204 10*3/uL (ref 150–400)
RBC: 2.72 MIL/uL — ABNORMAL LOW (ref 4.22–5.81)
RDW: 15.4 % (ref 11.5–15.5)
WBC: 10.7 10*3/uL — ABNORMAL HIGH (ref 4.0–10.5)
nRBC: 2 % — ABNORMAL HIGH (ref 0.0–0.2)

## 2019-03-05 LAB — BASIC METABOLIC PANEL
Anion gap: 6 (ref 5–15)
BUN: 14 mg/dL (ref 8–23)
CO2: 28 mmol/L (ref 22–32)
Calcium: 7.3 mg/dL — ABNORMAL LOW (ref 8.9–10.3)
Chloride: 104 mmol/L (ref 98–111)
Creatinine, Ser: 0.69 mg/dL (ref 0.61–1.24)
GFR calc Af Amer: >60 mL/min (ref >60)
GFR calc non Af Amer: >60 mL/min (ref >60)
Glucose, Bld: 125 mg/dL — ABNORMAL HIGH (ref 70–99)
Potassium: 3.8 mmol/L (ref 3.5–5.1)
Sodium: 138 mmol/L (ref 135–145)

## 2019-03-05 LAB — CULTURE, BLOOD (ROUTINE X 2)
Culture: NO GROWTH
Special Requests: ADEQUATE

## 2019-03-05 MED ORDER — SODIUM CHLORIDE 0.9 % IV SOLN
INTRAVENOUS | Status: DC | PRN
  Administered 2019-03-05: 5 mL via INTRAVENOUS
  Administered 2019-03-06: 250 mL via INTRAVENOUS

## 2019-03-05 MED ORDER — INSULIN ASPART 100 UNIT/ML ~~LOC~~ SOLN
5.0000 [IU] | Freq: Once | SUBCUTANEOUS | Status: AC
  Administered 2019-03-05: 5 [IU] via SUBCUTANEOUS
  Filled 2019-03-05: qty 1

## 2019-03-05 MED ORDER — TICAGRELOR 90 MG PO TABS
90.0000 mg | ORAL_TABLET | Freq: Two times a day (BID) | ORAL | Status: DC
  Administered 2019-03-05: 90 mg via ORAL
  Filled 2019-03-05 (×2): qty 1

## 2019-03-05 MED ORDER — ACETAMINOPHEN 325 MG PO TABS
650.0000 mg | ORAL_TABLET | Freq: Four times a day (QID) | ORAL | Status: DC | PRN
  Administered 2019-03-05 – 2019-03-15 (×3): 650 mg via ORAL
  Filled 2019-03-05 (×3): qty 2

## 2019-03-05 MED ORDER — INSULIN ASPART 100 UNIT/ML ~~LOC~~ SOLN
0.0000 [IU] | Freq: Three times a day (TID) | SUBCUTANEOUS | Status: DC
  Administered 2019-03-05: 18:00:00 3 [IU] via SUBCUTANEOUS
  Administered 2019-03-05: 5 [IU] via SUBCUTANEOUS
  Administered 2019-03-05: 3 [IU] via SUBCUTANEOUS
  Administered 2019-03-06: 7 [IU] via SUBCUTANEOUS
  Administered 2019-03-06 (×2): 5 [IU] via SUBCUTANEOUS
  Administered 2019-03-07: 13:00:00 9 [IU] via SUBCUTANEOUS
  Administered 2019-03-07 – 2019-03-08 (×3): 7 [IU] via SUBCUTANEOUS
  Administered 2019-03-08 (×2): 5 [IU] via SUBCUTANEOUS
  Administered 2019-03-09: 3 [IU] via SUBCUTANEOUS
  Administered 2019-03-09: 18:00:00 7 [IU] via SUBCUTANEOUS
  Administered 2019-03-09: 5 [IU] via SUBCUTANEOUS
  Administered 2019-03-10: 7 [IU] via SUBCUTANEOUS
  Administered 2019-03-10: 5 [IU] via SUBCUTANEOUS
  Administered 2019-03-10 – 2019-03-11 (×2): 2 [IU] via SUBCUTANEOUS
  Administered 2019-03-11: 1 [IU] via SUBCUTANEOUS
  Administered 2019-03-11: 09:00:00 5 [IU] via SUBCUTANEOUS
  Administered 2019-03-12 (×2): 3 [IU] via SUBCUTANEOUS
  Filled 2019-03-05 (×23): qty 1

## 2019-03-05 MED ORDER — INSULIN GLARGINE 100 UNIT/ML ~~LOC~~ SOLN
4.0000 [IU] | Freq: Every day | SUBCUTANEOUS | Status: DC
  Administered 2019-03-05 – 2019-03-06 (×2): 4 [IU] via SUBCUTANEOUS
  Filled 2019-03-05 (×2): qty 0.04

## 2019-03-05 MED ORDER — HYDROCODONE-ACETAMINOPHEN 5-325 MG PO TABS
1.0000 | ORAL_TABLET | Freq: Four times a day (QID) | ORAL | Status: DC | PRN
  Administered 2019-03-05 – 2019-03-07 (×4): 1 via ORAL
  Filled 2019-03-05 (×4): qty 1

## 2019-03-05 MED ORDER — INSULIN ASPART 100 UNIT/ML ~~LOC~~ SOLN
0.0000 [IU] | Freq: Every day | SUBCUTANEOUS | Status: DC
  Administered 2019-03-05: 3 [IU] via SUBCUTANEOUS
  Administered 2019-03-05: 21:00:00 2 [IU] via SUBCUTANEOUS
  Administered 2019-03-06: 22:00:00 4 [IU] via SUBCUTANEOUS
  Administered 2019-03-07 – 2019-03-08 (×2): 2 [IU] via SUBCUTANEOUS
  Administered 2019-03-09: 22:00:00 3 [IU] via SUBCUTANEOUS
  Administered 2019-03-11 – 2019-03-12 (×2): 2 [IU] via SUBCUTANEOUS
  Filled 2019-03-05 (×8): qty 1

## 2019-03-05 MED ORDER — MORPHINE SULFATE (PF) 2 MG/ML IV SOLN
1.0000 mg | INTRAVENOUS | Status: DC | PRN
  Administered 2019-03-06 – 2019-03-12 (×8): 1 mg via INTRAVENOUS
  Filled 2019-03-05 (×8): qty 1

## 2019-03-05 MED ORDER — ROSUVASTATIN CALCIUM 20 MG PO TABS
40.0000 mg | ORAL_TABLET | Freq: Every day | ORAL | Status: DC
  Administered 2019-03-05 – 2019-03-18 (×14): 40 mg via ORAL
  Filled 2019-03-05 (×15): qty 2

## 2019-03-05 NOTE — Progress Notes (Signed)
Notified Dr Jannifer [facility] of CBG 309. Received insulin order.

## 2019-03-05 NOTE — Progress Notes (Signed)
   03/05/19 1258  Clinical Encounter Type  Visited With Patient  Visit Type Follow-up  Referral From Palliative care team   Chaplain returned with [place] to finalize HPOA documents. At this time, the patient was resting, but grateful for the follow-up. Notarized hard-copy given to patient for his records and scanned copy will be uploaded to the patient's file by the unit secretary.

## 2019-03-05 NOTE — Progress Notes (Signed)
Ch visited pt regarding an urgent OR for AD completion with an HPOA. Ch spoke with pt regarding his health challenges. Pt shared that he is a diabetic that gives his own insulin shots but also has heart issues. He shared that he has not been tolerating food that well and presented to hv a somber affect. Ch informed pt that someone would be in contact w/ him soon to finalize an HPOA. Pt agreed. Ch noted the desired HPOA to the pt's chart. Pt received f/u care from [facility].    03/05/19 1135  Clinical Encounter Type  Visited With Patient;Health care provider  Visit Type Psychological support;Spiritual support;Social support  Referral From Physician  Consult/Referral To Chaplain  Spiritual Encounters  Spiritual Needs Emotional  Stress Factors  Patient Stress Factors Exhausted;Family relationships;Financial concerns;Health changes;Loss of control;Major life changes  Family Stress Factors None identified

## 2019-03-05 NOTE — Progress Notes (Signed)
PT Cancellation  Patient Details Name: Jaikob Borgwardt MRN: [id number] DOB: 30-Oct-1955   Cancelled Treatment:    Reason Eval/Treat Not Completed: Patient not medically ready.  Chart reviewed.  Discussed pt with pt's nurse.  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  Documentation in notes of possible further procedure for limb salvage (vs possible amputation).  Will monitor pt's status and re-attempt PT evaluation as medically appropriate at a later date/time.  Leitha Bleak, PT 03/05/19, 11:00 AM [phone]

## 2019-03-05 NOTE — Progress Notes (Signed)
[facility]  Daily Progress Note   Subjective  - 1 Day Post-Op  Left foot in pain.  Had some chest pain earlier, none now.  No other events  Objective Vitals:   03/04/19 1651 03/04/19 1958 03/05/19 0501 03/05/19 1405  BP: 101/71 99/70 112/73 (!) 95/53  Pulse: 84 81 88 94  Resp: 17 19 19 17   Temp: 97.8 F (36.6 C) 98.3 F (36.8 C) 99.1 F (37.3 C) 99.7 F (37.6 C)  TempSrc: Oral  Oral Oral  SpO2: 95% 100% 100% 97%  Weight:      Height:        Intake/Output Summary (Last 24 hours) at 03/05/2019 1725 Last data filed at 03/05/2019 1340 Gross per 24 hour  Intake 287.83 ml  Output 1000 ml  Net -712.17 ml    PULM  CTAB CV  RRR VASC  Left foot cool, gangrenous changes to the forefoot  Laboratory CBC    Component Value Date/Time   WBC 10.7 (H) 03/05/2019 0442   HGB 7.7 (L) 03/05/2019 0442   HCT 23.3 (L) 03/05/2019 0442   PLT 204 03/05/2019 0442    BMET    Component Value Date/Time   NA 138 03/05/2019 0442   K 3.8 03/05/2019 0442   CL 104 03/05/2019 0442   CO2 28 03/05/2019 0442   GLUCOSE 125 (H) 03/05/2019 0442   BUN 14 03/05/2019 0442   CREATININE 0.69 03/05/2019 0442   CALCIUM 7.3 (L) 03/05/2019 0442   GFRNONAA >60 03/05/2019 0442   GFRAA >60 03/05/2019 0442    Assessment/Planning: POD #1 s/p LLE angiogram, percutaneous intervention   Has extensive gangrenous changes to the forefoot, and has very poor runoff as well as inflow disease  Would likely benefit from left femoral thromboendarterectomy to improve inflow, but suspect distal runoff too disadvantaged to salvage the foot.  This would still be necessary to get a BKA to heal.  With positive blood cultures earlier this week, have discussed with Dr. Leslye Peer and will repeat blood cultures to ensure not bacteremic before considering endarterectomy/patch placement.  Those ordered today  If patient desires aggressive care, and blood cultures negative, would plan left femoral  endarterectomy Monday and likely BKA either at that time or later in the week  If he opts for comfort care, only pain management would be planned  Palliative care to meet again with patient again tomorrow to discuss options  If he opts for aggressive care with recent MI and other ongoing issue, will be high risk.    Very difficult situation in a patient with multiple other issues.    Leotis Pain  03/05/2019, 5:25 PM

## 2019-03-05 NOTE — Progress Notes (Signed)
SUBJECTIVE: Patient had chest pain this morning   Vitals:   03/04/19 1630 03/04/19 1651 03/04/19 1958 03/05/19 0501  BP: 94/76 101/71 99/70 112/73  Pulse: 78 84 81 88  Resp: 12 17 19 19   Temp:  97.8 F (36.6 C) 98.3 F (36.8 C) 99.1 F (37.3 C)  TempSrc:  Oral  Oral  SpO2: 100% 95% 100% 100%  Weight:      Height:        Intake/Output Summary (Last 24 hours) at 03/05/2019 0932 Last data filed at 03/05/2019 0929 Gross per 24 hour  Intake 167.83 ml  Output 1875 ml  Net -1707.17 ml    LABS: Basic Metabolic Panel: Recent Labs    03/04/19 0548 03/05/19 0442  NA 140 138  K 4.4 3.8  CL 106 104  CO2 27 28  GLUCOSE 384* 125*  BUN 17 14  CREATININE 0.74 0.69  CALCIUM 7.5* 7.3*   Liver Function Tests: Recent Labs    03/04/19 0548  AST 26  ALT 14  ALKPHOS 35*  BILITOT 0.5  PROT 5.6*  ALBUMIN 2.3*   No results for input(s): LIPASE, AMYLASE in the last 72 hours. CBC: Recent Labs    03/04/19 0548 03/05/19 0442  WBC 15.8* 10.7*  HGB 6.9* 7.7*  HCT 21.6* 23.3*  MCV 86.4 85.7  PLT 249 204   Cardiac Enzymes: No results for input(s): CKTOTAL, CKMB, CKMBINDEX, TROPONINI in the last 72 hours. BNP: Invalid input(s): POCBNP D-Dimer: No results for input(s): DDIMER in the last 72 hours. Hemoglobin A1C: No results for input(s): HGBA1C in the last 72 hours. Fasting Lipid Panel: No results for input(s): CHOL, HDL, LDLCALC, TRIG, CHOLHDL, LDLDIRECT in the last 72 hours. Thyroid Function Tests: No results for input(s): TSH, T4TOTAL, T3FREE, THYROIDAB in the last 72 hours.  Invalid input(s): FREET3 Anemia Panel: No results for input(s): VITAMINB12, FOLATE, FERRITIN, TIBC, IRON, RETICCTPCT in the last 72 hours.   PHYSICAL EXAM General: Well developed, well nourished, in no acute distress HEENT:  Normocephalic and atramatic Neck:  No JVD.  Lungs: Clear bilaterally to auscultation and percussion. Heart: HRRR . Normal S1 and S2 without gallops or murmurs.  Abdomen:  Bowel sounds are positive, abdomen soft and non-tender  Msk:  Back normal, normal gait. Normal strength and tone for age. Extremities: No clubbing, cyanosis or edema.   Neuro: Alert and oriented X 3. Psych:  Good affect, responds appropriately  TELEMETRY: Sinus rhythm  ASSESSMENT AND PLAN: Status post STEMI with inferoposterior myocardial infarction with: Myocardial infarction angina.  Patient is hospice care now with palliative care and question is patient should be treated medically and conservatively.  Active Problems:   Respiratory failure ([place])    Dionisio David, MD, [facility] 03/05/2019 9:32 AM

## 2019-03-05 NOTE — Progress Notes (Signed)
Daily Progress Note   Patient Name: Richard [name]       Date: 03/05/2019 DOB: 1956/01/26  Age: 63 y.o. MRN#: [id number] Attending Physician: Loletha Grayer, MD Primary Care Physician: [facility] [place] Date: 02/28/2019  Reason for Consultation/Follow-up: Establishing goals of care  Subjective: Patient is resting in bed. He is alert and oriented today. He is tearful.  He voices his awareness of his current status and health issues. He states "I don't want to die like this."    We discussed his diagnosis, prognosis, and broached GOC, EOL wishes disposition and options.   The difference between an aggressive medical intervention path and a comfort care path was discussed.  Values and goals of care important to patient and family were attempted to be elicited.  Discussed limitations of medical interventions to prolong quality of life in some situations and discussed the concept of human mortality.  He will prayerfully consider his options. Spoke with chaplain about completing HPOA papers as his desired decision maker would be not be the legal decision maker.   Will return tomorrow to determine if he would like comfort vs full scope approach. Will further discuss GOC.    Length of Stay: 5  Current Medications: Scheduled Meds:  . aspirin  81 mg Oral Daily  . enoxaparin (LOVENOX) injection  40 mg Subcutaneous Q24H  . feeding supplement (ENSURE ENLIVE)  237 mL Oral TID BM  . insulin aspart  0-5 Units Subcutaneous QHS  . insulin aspart  0-9 Units Subcutaneous TID WC  . insulin glargine  4 Units Subcutaneous Daily  . mouth rinse  15 mL Mouth Rinse BID  . rosuvastatin  40 mg Oral q1800  . ticagrelor  90 mg Oral BID    Continuous Infusions: . cefTRIAXone (ROCEPHIN)   IV 2 g (03/04/19 1900)    PRN Meds: acetaminophen, etomidate, morphine injection, naLOXone (NARCAN)  injection, ondansetron (ZOFRAN) IV  Physical Exam Skin:    General: Skin is warm and dry.  Neurological:     Mental Status: He is alert.             Vital Signs: BP 112/73 (BP Location: Left Arm)   Pulse 88   Temp 99.1 F (37.3 C) (Oral)   Resp 19   Ht 5\' 5"  (1.651 m)  Wt 57.3 kg   SpO2 100%   BMI 21.02 kg/m  SpO2: SpO2: 100 % O2 Device: O2 Device: Room Air O2 Flow Rate: O2 Flow Rate (L/min): 2 L/min  Intake/output summary:   Intake/Output Summary (Last 24 hours) at 03/05/2019 1122 Last data filed at 03/05/2019 0254 Gross per 24 hour  Intake 167.83 ml  Output 1350 ml  Net -1182.17 ml   LBM: Last BM Date: 03/03/19 Baseline Weight: Weight: 54 kg Most recent weight: Weight: 57.3 kg       Palliative Assessment/Data:    Flowsheet Rows     Most Recent Value  Intake Tab  Referral Department  Critical care  Unit at Time of Referral  ICU  Palliative Care Primary Diagnosis  Cancer  Date Notified  02/28/19  Palliative Care Type  New Palliative care  Reason for referral  Clarify Goals of Care  Date of Admission  02/28/19  Date first seen by Palliative Care  03/02/19  # of days Palliative referral response time  2 Day(s)  # of days IP prior to Palliative referral  0  Clinical Assessment  Psychosocial & Spiritual Assessment  Palliative Care Outcomes      Patient Active Problem List   Diagnosis Date Noted  . Respiratory failure ([place]) 02/28/2019  . Diabetes with hyperosmolar coma ([place]) 11/11/2018  . A-fib ([place]) 09/20/2018  . Hyperglycemia 08/03/2018  . Protein-calorie malnutrition, severe 01/10/2018  . Sepsis ([place]) 01/09/2018  . DKA, type 2 ([place]) 09/05/2017  . Ileus ([place]) 08/24/2017  . Diabetes ([place]) 08/24/2017  . Atrial flutter ([place]) 08/24/2017  . CAD (coronary artery disease) 08/24/2017  . HTN (hypertension) 08/24/2017  . Atrial fibrillation ([place])  08/24/2017  . Tobacco use disorder, severe, dependence 07/25/2017  . Wound dehiscence, surgical, initial encounter 06/24/2017  . Acute hematogenous osteomyelitis of right foot ([place]) 06/20/2017  . Adenocarcinoma of pancreas ([place]) 06/20/2017  . Esophagitis 06/20/2017  . Cellulitis of right lower extremity 06/10/2017  . Hypertension, poor control 06/10/2017  . Insulin dependent type 2 diabetes mellitus ([place]) 06/10/2017  . Chest pain 08/10/2015    Palliative Care Assessment & Plan    Recommendations/Plan:  Will need HPOA papers completed now that he is alert and oriented.      Code Status:    Code Status Orders  (From admission, onward)         Start     Ordered   03/02/19 1454  Full code  Continuous     03/02/19 1454        Code Status History    Date Active Date Inactive Code Status Order ID Comments User Context   02/28/2019 1449 03/01/2019 0836 Full Code [id number]  Gorden Harms, MD Inpatient   02/28/2019 1449 02/28/2019 1449 Full Code [id number]  Gorden Harms, MD Inpatient   11/11/2018 0758 11/18/2018 1814 DNR [id number]  Loletha Grayer, MD ED   09/20/2018 1635 09/24/2018 1854 Full Code [id number]  Fritzi Mandes, MD ED   08/03/2018 1020 08/04/2018 1813 Full Code [id number]  Salary, Avel Peace, MD ED   01/09/2018 1025 01/11/2018 1932 Full Code [id number]  Harrie Foreman, MD Inpatient   09/05/2017 1559 09/11/2017 1928 Full Code [id number]  Fritzi Mandes, MD Inpatient   08/24/2017 0238 08/29/2017 2131 Full Code [id number]  Lance Coon, MD Inpatient   08/10/2015 0822 08/11/2015 1947 Full Code [id number]  Harrie Foreman, MD Inpatient       Prognosis:   Unable to determine  Discharge Planning:  To Be Determined  Care plan was discussed with CCM  Thank you for allowing the Palliative Medicine Team to assist in the care of this patient.   Total Time 35 min Prolonged Time Billed  no       Greater than 50%  of this time was spent counseling and coordinating care  related to the above assessment and plan.  Asencion Gowda, NP  Please contact Palliative Medicine Team phone at [phone] for questions and concerns.

## 2019-03-05 NOTE — Progress Notes (Signed)
   03/05/19 1200  Clinical Encounter Type  Visited With Patient;Health care provider  Visit Type Initial  Referral From Palliative care team  Spiritual Encounters  Spiritual Needs Brochure   A referral was received from Asencion Gowda, NP with the Palliative Care Team who is assisting the patient with completing HPOA documentation. Upon arrival, the patient had just finished having lunch. He was alert, oriented, and welcoming. This Probation officer was briefly accompanied by Melbourne Abts, who was following up on a prior visit. The patient confirmed interest in establishing HPOA and affirmed his desire to establish Wynn Banker, who is the mother of his 1 year old daughter, as that person.  Chaplain provided education and confirmed capacity. We engaged in brief conversation about his health and taking good care of himself. Patient acknowledged that he wants things to be different. Visiting at a later time to discuss existential questions and what's at the heart of his challenges would be appropriate. Chaplain will return shortly with Notary to finalize HPOA documents.

## 2019-03-05 NOTE — Progress Notes (Signed)
Patient ID: Richard [name], male   DOB: 1955/12/30, 64 y.o.   MRN: [id number]  [employer] PROGRESS NOTE  Richard [name] XIP:[id number] DOB: [birth year] DOA: 02/28/2019 PCP: [facility] [place]  HPI/Subjective: Patient more alert today.  Offers no complaints.  States his appetite is not good.  Having some pain in his foot.  Objective: Vitals:   03/05/19 0501 03/05/19 1405  BP: 112/73 (!) 95/53  Pulse: 88 94  Resp: 19 17  Temp: 99.1 F (37.3 C) 99.7 F (37.6 C)  SpO2: 100% 97%    Filed Weights   02/28/19 1443 03/01/19 0416 03/04/19 1248  Weight: 55 kg 57.3 kg 57.3 kg    ROS: Review of Systems  Constitutional: Negative for chills and fever.  Eyes: Negative for blurred vision.  Respiratory: Negative for cough and shortness of breath.   Cardiovascular: Negative for chest pain.  Gastrointestinal: Negative for abdominal pain, constipation, diarrhea, nausea and vomiting.  Genitourinary: Negative for dysuria.  Musculoskeletal: Positive for joint pain.  Neurological: Negative for dizziness and headaches.   Exam: Physical Exam  HENT:  Nose: No mucosal edema.  Mouth/Throat: No oropharyngeal exudate or posterior oropharyngeal edema.  Eyes: Pupils are equal, round, and reactive to light. Conjunctivae and lids are normal.  Neck: No JVD present. Carotid bruit is not present. No edema present. No thyroid mass and no thyromegaly present.  Cardiovascular: S1 normal and S2 normal. Exam reveals no gallop.  No murmur heard. Respiratory: No respiratory distress. He has no wheezes. He has no rhonchi. He has no rales.  GI: Soft. Bowel sounds are normal. There is no abdominal tenderness.  Musculoskeletal:     Right ankle: He exhibits no swelling.     Left ankle: He exhibits no swelling.     Comments: Gangrenous left first and second toe  Lymphadenopathy:    He has no cervical adenopathy.  Neurological: He is alert.  Moves all extremities on his own  Skin: Skin is warm.  Nails show no clubbing.  Left first and second toe gangrenous.  Prior amputation of the right trans-met.  Psychiatric: He has a normal mood and affect.      Data Reviewed: Basic Metabolic Panel: Recent Labs  Lab 02/28/19 2038  03/01/19 0724 03/02/19 0423 03/03/19 0521 03/04/19 0548 03/05/19 0442  NA 147*   < > 153* 154* 154* 140 138  K 3.5   < > 3.2* 3.4* 3.7 4.4 3.8  CL 110   < > 115* 120* 118* 106 104  CO2 15*   < > _0 GLUCOSE 425*   < > 180* 167* 222* 384* 125*  BUN 83*   < > 86* 46* 24* 17 14  CREATININE 2.69*   < > 1.80* 0.86 0.74 0.74 0.69  CALCIUM 7.7*   < > 7.6* 7.9* 8.0* 7.5* 7.3*  MG 2.2  --  2.1  --   --   --   --   PHOS 3.8  --  1.7*  --   --   --   --    < > = values in this interval not displayed.   Liver Function Tests: Recent Labs  Lab 02/28/19 0948 03/02/19 0423 03/04/19 0548  AST 23 45* 26  ALT _1 ALKPHOS 40 30* 35*  BILITOT 2.1* 0.6 0.5  PROT 5.9* 6.0* 5.6*  ALBUMIN 2.3* 2.5* 2.3*   Recent Labs  Lab 02/28/19 0948  LIPASE 27   CBC: Recent Labs  Lab 02/28/19 0948  03/01/19 0404 03/02/19 0423 03/03/19 0521 03/03/19 1823 03/04/19 0548 03/05/19 0442  WBC 22.4*   < > 16.9* 20.2* 17.7*  --  15.8* 10.7*  NEUTROABS 18.0*  --   --   --   --   --   --   --   HGB 7.7*   < > 7.2* 6.8* 6.4* 7.3* 6.9* 7.7*  HCT 26.9*   < > 22.2* 20.8* 20.5* 22.9* 21.6* 23.3*  MCV 94.7   < > 81.6 82.2 86.1  --  86.4 85.7  PLT 417*   < > 331 314 310  --  249 204   < > = values in this interval not displayed.   Cardiac Enzymes: Recent Labs  Lab 02/28/19 0948 02/28/19 1550 02/28/19 1831 03/01/19 0038 03/01/19 0724  TROPONINI 2.31* 2.71* 4.13* 10.66* 14.28*    CBG: Recent Labs  Lab 03/04/19 2356 03/05/19 0107 03/05/19 0255 03/05/19 0736 03/05/19 1136  GLUCAP 309* 298* 157* 212* 253*    Recent Results (from the past 240 hour(s))  Blood Culture (routine x 2)     Status: Abnormal   Collection Time: 02/28/19  9:48 AM  Result  Value Ref Range Status   Specimen Description   Final    BLOOD RIGHT ANTECUBITAL Performed at [facility], [address], [place], [place] [address]    Special Requests   Final    BOTTLES DRAWN AEROBIC AND ANAEROBIC Blood Culture results may not be optimal due to an excessive volume of blood received in culture bottles Performed at [facility], [address] [place], [place] [address]    Culture (A)  Final    PROTEUS VULGARIS CRITICAL RESULT CALLED TO, READ BACK BY AND VERIFIED WITH:  PHARMD ASAJAH D [id number] FCP GROWTH RECOVERED FROM AEROBIC BOTTLE ONLY. Performed at [facility], [place] [address], [place], [place] [address]    Report Status 03/03/2019 FINAL  Final   Organism ID, Bacteria PROTEUS VULGARIS  Final      Susceptibility   Proteus vulgaris - MIC*    AMPICILLIN >=32 RESISTANT Resistant     CEFAZOLIN >=64 RESISTANT Resistant     CEFEPIME <=1 SENSITIVE Sensitive     CEFTAZIDIME <=1 SENSITIVE Sensitive     CEFTRIAXONE <=1 SENSITIVE Sensitive     CIPROFLOXACIN <=0.25 SENSITIVE Sensitive     GENTAMICIN <=1 SENSITIVE Sensitive     IMIPENEM 2 SENSITIVE Sensitive     TRIMETH/SULFA <=20 SENSITIVE Sensitive     AMPICILLIN/SULBACTAM 4 SENSITIVE Sensitive     PIP/TAZO <=4 SENSITIVE Sensitive     * PROTEUS VULGARIS  Blood Culture (routine x 2)     Status: None   Collection Time: 02/28/19  9:48 AM  Result Value Ref Range Status   Specimen Description BLOOD RIGHT ANTECUBITAL  Final   Special Requests   Final    BOTTLES DRAWN AEROBIC AND ANAEROBIC Blood Culture adequate volume   Culture   Final    NO GROWTH 5 DAYS Performed at [facility], [address] [place], [place] [address]    Report Status 03/05/2019 FINAL  Final  Urine culture     Status: None   Collection Time: 02/28/19  9:48 AM  Result Value Ref Range Status   Specimen Description   Final    URINE, RANDOM Performed at [facility], [address],  [place], [place] [address]    Special Requests   Final    NONE  Performed at [facility], [address], [place], [place] [address]    Culture   Final    NO GROWTH Performed at [facility], [place] [address] [place], [place] [address]    Report Status 03/01/2019 FINAL  Final  SARS Coronavirus 2 (CEPHEID- Performed in [place] hospital lab), Hosp Order     Status: None   Collection Time: 02/28/19  9:48 AM  Result Value Ref Range Status   SARS Coronavirus 2 NEGATIVE NEGATIVE Final    Comment: (NOTE) If result is NEGATIVE SARS-CoV-2 target nucleic acids are NOT DETECTED. The SARS-CoV-2 RNA is generally detectable in upper and lower  respiratory specimens during the acute phase of infection. The lowest  concentration of SARS-CoV-2 viral copies this assay can detect is 250  copies / mL. A negative result does not preclude SARS-CoV-2 infection  and should not be used as the sole basis for treatment or other  patient management decisions.  A negative result may occur with  improper specimen collection / handling, submission of specimen other  than nasopharyngeal swab, presence of viral mutation(s) within the  areas targeted by this assay, and inadequate number of viral copies  (<250 copies / mL). A negative result must be combined with clinical  observations, patient history, and epidemiological information. If result is POSITIVE SARS-CoV-2 target nucleic acids are DETECTED. The SARS-CoV-2 RNA is generally detectable in upper and lower  respiratory specimens dur ing the acute phase of infection.  Positive  results are indicative of active infection with SARS-CoV-2.  Clinical  correlation with patient history and other diagnostic information is  necessary to determine patient infection status.  Positive results do  not rule out bacterial infection or co-infection with other viruses. If result is PRESUMPTIVE POSTIVE SARS-CoV-2 nucleic acids MAY BE PRESENT.   A presumptive  positive result was obtained on the submitted specimen  and confirmed on repeat testing.  While 2019 novel coronavirus  (SARS-CoV-2) nucleic acids may be present in the submitted sample  additional confirmatory testing may be necessary for epidemiological  and / or clinical management purposes  to differentiate between  SARS-CoV-2 and other Sarbecovirus currently known to infect humans.  If clinically indicated additional testing with an alternate test  methodology [phone]) is advised. The SARS-CoV-2 RNA is generally  detectable in upper and lower respiratory sp ecimens during the acute  phase of infection. The expected result is Negative. Fact Sheet for Patients:  StrictlyIdeas.no Fact Sheet for Healthcare Providers: BankingDealers.co.za This test is not yet approved or cleared by the [place] FDA and has been authorized for detection and/or diagnosis of SARS-CoV-2 by FDA under an Emergency Use Authorization (EUA).  This EUA will remain in effect (meaning this test can be used) for the duration of the COVID-19 declaration under Section 564(b)(1) of the Act, 21 U.S.C. section 360bbb-3(b)(1), unless the authorization is terminated or revoked sooner. Performed at [facility], [address] [place], [place] [address]   MRSA PCR Screening     Status: None   Collection Time: 02/28/19  2:48 PM  Result Value Ref Range Status   MRSA by PCR NEGATIVE NEGATIVE Final    Comment:        The GeneXpert MRSA Assay (FDA approved for NASAL specimens only), is one component of a comprehensive MRSA colonization surveillance program. It is not intended to diagnose MRSA infection nor to guide or monitor treatment for MRSA infections. Performed at [facility], [place],  [place], [place] [address]      Studies: No results found.  Scheduled Meds: . aspirin  81 mg Oral Daily  . enoxaparin (LOVENOX) injection  40  mg Subcutaneous Q24H  . feeding supplement (ENSURE ENLIVE)  237 mL Oral TID BM  . insulin aspart  0-5 Units Subcutaneous QHS  . insulin aspart  0-9 Units Subcutaneous TID WC  . insulin glargine  4 Units Subcutaneous Daily  . mouth rinse  15 mL Mouth Rinse BID  . rosuvastatin  40 mg Oral q1800   Continuous Infusions: . cefTRIAXone (ROCEPHIN)  IV 2 g (03/04/19 1900)    Assessment/Plan:  1.  Sepsis with Proteus vulgaris.  Likely the gangrenous toes as the source.  Patient on Rocephin.  Case discussed with vascular surgery and they would like a repeat blood culture to make sure the sepsis has cleared.  Patient will need another vascular procedure likely a femoropopliteal bypass so an amputation would heal.  Patient will likely need a major amputation higher up than just the toes.  He will talk to the patient about this later. 2.  Severe peripheral vascular disease.  Angiogram showing poor blood supply to the lower extremities.  Vascular surgery to speak with the patient about amputation today. 3.  Acute hypoxic respiratory failure.  Try to taper off oxygen. 4.  Acute myocardial infarction.  Conservative management as per cardiology.  Patient on aspirin.  Hold off on Brilinta because the patient may need a amputation.  High-dose Crestor started. 5.  Hypoglycemia with diabetes yesterday.  Sugars now better.  Start low-dose Lantus and sliding scale. 6.  Acute kidney injury normalized with IV fluids. 7.  Cocaine abuse 8.  Chronic atrial fibrillation.  We will have to discuss anticoagulation after procedure. 9.  History of pancreatic cancer status post Whipple procedure in 2018 10.  Hyponatremia.  This has normalized. 11.  Anemia.  Patient has received 2 units of packed red blood cells.  Patient's hemoglobin still on the lower side.  If bypass and amputation planned he will likely need more blood.   Code Status:     Code Status Orders  (From admission, onward)         Start     Ordered    03/02/19 1454  Full code  Continuous     03/02/19 1454        Code Status History    Date Active Date Inactive Code Status Order ID Comments User Context   02/28/2019 1449 03/01/2019 0836 Full Code [id number]  Gorden Harms, MD Inpatient   02/28/2019 1449 02/28/2019 1449 Full Code [id number]  Gorden Harms, MD Inpatient   11/11/2018 0758 11/18/2018 1814 DNR [id number]  Loletha Grayer, MD ED   09/20/2018 1635 09/24/2018 1854 Full Code [id number]  Fritzi Mandes, MD ED   08/03/2018 1020 08/04/2018 1813 Full Code [id number]  Salary, Avel Peace, MD ED   01/09/2018 1025 01/11/2018 1932 Full Code [id number]  Harrie Foreman, MD Inpatient   09/05/2017 1559 09/11/2017 1928 Full Code [id number]  Fritzi Mandes, MD Inpatient   08/24/2017 0238 08/29/2017 2131 Full Code [id number]  Lance Coon, MD Inpatient   08/10/2015 0822 08/11/2015 1947 Full Code [id number]  Harrie Foreman, MD Inpatient     Disposition Plan: TBD  Consultants:  Critical care specialist  Cardiology  Palliative care  Vascular surgery  Podiatry  Antibiotics:  Rocephin  Time spent: 27 minutes.  Spoke with patient's significant other on the phone  [employer]

## 2019-03-05 NOTE — Progress Notes (Signed)
Spoke to Dr. Lucky Cowboy (vascular) and intervention was not as successful as he would like.  Is planning for endarterectomy for attempts at limb salvage.    Will hold on amputation of digits at this time.  Wound was dry and not actively draining purulence.   Reconsult if I can be of assistance.

## 2019-03-05 NOTE — ACP (Advance Care Planning) (Addendum)
Mr. Boesen shared that he desires his HPOA to be Wynn Banker [phone]- daughter's mother (significant other).

## 2019-03-06 LAB — GLUCOSE, CAPILLARY
Glucose-Capillary: 199 mg/dL — ABNORMAL HIGH (ref 70–99)
Glucose-Capillary: 239 mg/dL — ABNORMAL HIGH (ref 70–99)
Glucose-Capillary: 280 mg/dL — ABNORMAL HIGH (ref 70–99)
Glucose-Capillary: 284 mg/dL — ABNORMAL HIGH (ref 70–99)
Glucose-Capillary: 289 mg/dL — ABNORMAL HIGH (ref 70–99)
Glucose-Capillary: 310 mg/dL — ABNORMAL HIGH (ref 70–99)
Glucose-Capillary: 318 mg/dL — ABNORMAL HIGH (ref 70–99)

## 2019-03-06 MED ORDER — SODIUM CHLORIDE 0.9% FLUSH
10.0000 mL | INTRAVENOUS | Status: DC | PRN
  Administered 2019-03-06 (×2): 10 mL via INTRAVENOUS
  Filled 2019-03-06 (×2): qty 10

## 2019-03-06 MED ORDER — SODIUM CHLORIDE 0.9% FLUSH
10.0000 mL | Freq: Two times a day (BID) | INTRAVENOUS | Status: DC
  Administered 2019-03-06 – 2019-03-19 (×20): 10 mL via INTRAVENOUS

## 2019-03-06 MED ORDER — INSULIN GLARGINE 100 UNIT/ML ~~LOC~~ SOLN
6.0000 [IU] | Freq: Every day | SUBCUTANEOUS | Status: DC
  Administered 2019-03-07: 11:00:00 6 [IU] via SUBCUTANEOUS
  Filled 2019-03-06: qty 0.06

## 2019-03-06 MED ORDER — PREDNISONE 50 MG PO TABS: 50.0000 mg | ORAL_TABLET | Freq: Once | ORAL | Status: DC

## 2019-03-06 NOTE — Progress Notes (Signed)
PT Cancellation Note  Patient Details Name: Richard [name] MRN: [id number] DOB: 1956-08-17   Cancelled Treatment:    Reason Eval/Treat Not Completed: Patient declined, no reason specified.  PT consult received.  Chart reviewed.  Upon PT entering pt's room, pt initially agreeable to participating in therapy but appearing upset that he had not received any water/ice yet; upon therapists return (brought pt requested water and ice after cleared by nurse) pt refusing to do anything today with therapy.  Will re-attempt PT eval at a later date/time.  Leitha Bleak, PT 03/06/19, 3:30 PM [phone]

## 2019-03-06 NOTE — Progress Notes (Addendum)
Patient ID: Richard [name], male   DOB: [birth year], 63 y.o.   MRN: [id number]   [facility] PROGRESS NOTE  Richard [name] HER:[id number] DOB: 1956-08-15 DOA: 02/28/2019 PCP: [facility]  HPI/Subjective: Patient states his foot is throbbing.  Patient was noted to have 2 stage II decubitus on the buttock.  No complaints of chest pain or shortness of breath.  Objective: Vitals:   03/06/19 0920 03/06/19 0921  BP: 124/73   Pulse: 98   Resp: 18   Temp: 100 F (37.8 C) 99.9 F (37.7 C)  SpO2: 99%     Filed Weights   02/28/19 1443 03/01/19 0416 03/04/19 1248  Weight: 55 kg 57.3 kg 57.3 kg    ROS: Review of Systems  Constitutional: Negative for chills and fever.  Eyes: Negative for blurred vision.  Respiratory: Negative for cough and shortness of breath.   Cardiovascular: Negative for chest pain.  Gastrointestinal: Negative for abdominal pain, constipation, diarrhea, nausea and vomiting.  Genitourinary: Negative for dysuria.  Musculoskeletal: Positive for joint pain.  Neurological: Negative for dizziness and headaches.   Exam: Physical Exam  HENT:  Nose: No mucosal edema.  Mouth/Throat: No oropharyngeal exudate or posterior oropharyngeal edema.  Eyes: Pupils are equal, round, and reactive to light. Conjunctivae and lids are normal.  Neck: No JVD present. Carotid bruit is not present. No edema present. No thyroid mass and no thyromegaly present.  Cardiovascular: S1 normal and S2 normal. Exam reveals no gallop.  No murmur heard. Respiratory: No respiratory distress. He has no wheezes. He has no rhonchi. He has no rales.  GI: Soft. Bowel sounds are normal. There is no abdominal tenderness.  Musculoskeletal:     Right ankle: He exhibits no swelling.     Left ankle: He exhibits no swelling.     Comments: Gangrenous left first and second toe  Lymphadenopathy:    He has no cervical adenopathy.  Neurological: He is alert.  Moves all extremities on his own   Skin: Skin is warm. Nails show no clubbing.  Left first and second toe gangrenous.  Demarcation starting to go up the left foot now.  Has some blistering on the left foot.  Prior amputation of the right trans-met. 2 stage II decubiti noninfected on the buttock sacral area.  Psychiatric: He has a normal mood and affect.      Data Reviewed: Basic Metabolic Panel: Recent Labs  Lab 02/28/19 2038  03/01/19 0724 03/02/19 0423 03/03/19 0521 03/04/19 0548 03/05/19 0442  NA 147*   < > 153* 154* 154* 140 138  K 3.5   < > 3.2* 3.4* 3.7 4.4 3.8  CL 110   < > 115* 120* 118* 106 104  CO2 15*   < > 25 27 29 27 28   GLUCOSE 425*   < > 180* 167* 222* 384* 125*  BUN 83*   < > 86* 46* 24* 17 14  CREATININE 2.69*   < > 1.80* 0.86 0.74 0.74 0.69  CALCIUM 7.7*   < > 7.6* 7.9* 8.0* 7.5* 7.3*  MG 2.2  --  2.1  --   --   --   --   PHOS 3.8  --  1.7*  --   --   --   --    < > = values in this interval not displayed.   Liver Function Tests: Recent Labs  Lab 02/28/19 0948 03/02/19 0423 03/04/19 0548  AST 23 45* 26  ALT 14 16 14  ALKPHOS 40 30* 35*  BILITOT 2.1* 0.6 0.5  PROT 5.9* 6.0* 5.6*  ALBUMIN 2.3* 2.5* 2.3*   Recent Labs  Lab 02/28/19 0948  LIPASE 27   CBC: Recent Labs  Lab 02/28/19 0948  03/01/19 0404 03/02/19 0423 03/03/19 0521 03/03/19 1823 03/04/19 0548 03/05/19 0442  WBC 22.4*   < > 16.9* 20.2* 17.7*  --  15.8* 10.7*  NEUTROABS 18.0*  --   --   --   --   --   --   --   HGB 7.7*   < > 7.2* 6.8* 6.4* 7.3* 6.9* 7.7*  HCT 26.9*   < > 22.2* 20.8* 20.5* 22.9* 21.6* 23.3*  MCV 94.7   < > 81.6 82.2 86.1  --  86.4 85.7  PLT 417*   < > 331 314 310  --  249 204   < > = values in this interval not displayed.   Cardiac Enzymes: Recent Labs  Lab 02/28/19 0948 02/28/19 1550 02/28/19 1831 03/01/19 0038 03/01/19 0724  TROPONINI 2.31* 2.71* 4.13* 10.66* 14.28*    CBG: Recent Labs  Lab 03/05/19 2047 03/06/19 0011 03/06/19 0424 03/06/19 0830 03/06/19 1151  GLUCAP 204*  199* 289* 284* 280*    Recent Results (from the past 240 hour(s))  Blood Culture (routine x 2)     Status: Abnormal   Collection Time: 02/28/19  9:48 AM  Result Value Ref Range Status   Specimen Description   Final    BLOOD RIGHT ANTECUBITAL Performed at [facility], [address] [place], [place] [address]    Special Requests   Final    BOTTLES DRAWN AEROBIC AND ANAEROBIC Blood Culture results may not be optimal due to an excessive volume of blood received in culture bottles Performed at [facility], [address] [place], [place] [address]    Culture (A)  Final    PROTEUS VULGARIS CRITICAL RESULT CALLED TO, READ BACK BY AND VERIFIED WITH:  PHARMD ASAJAH D [id number] FCP GROWTH RECOVERED FROM AEROBIC BOTTLE ONLY. Performed at [facility], [place] [address], [place],  [address]    Report Status 03/03/2019 FINAL  Final   Organism ID, Bacteria PROTEUS VULGARIS  Final      Susceptibility   Proteus vulgaris - MIC*    AMPICILLIN >=32 RESISTANT Resistant     CEFAZOLIN >=64 RESISTANT Resistant     CEFEPIME <=1 SENSITIVE Sensitive     CEFTAZIDIME <=1 SENSITIVE Sensitive     CEFTRIAXONE <=1 SENSITIVE Sensitive     CIPROFLOXACIN <=0.25 SENSITIVE Sensitive     GENTAMICIN <=1 SENSITIVE Sensitive     IMIPENEM 2 SENSITIVE Sensitive     TRIMETH/SULFA <=20 SENSITIVE Sensitive     AMPICILLIN/SULBACTAM 4 SENSITIVE Sensitive     PIP/TAZO <=4 SENSITIVE Sensitive     * PROTEUS VULGARIS  Blood Culture (routine x 2)     Status: None   Collection Time: 02/28/19  9:48 AM  Result Value Ref Range Status   Specimen Description BLOOD RIGHT ANTECUBITAL  Final   Special Requests   Final    BOTTLES DRAWN AEROBIC AND ANAEROBIC Blood Culture adequate volume   Culture   Final    NO GROWTH 5 DAYS Performed at [facility], [address], [place],  [address]    Report Status 03/05/2019 FINAL  Final  Urine culture     Status: None   Collection Time:  02/28/19  9:48 AM  Result Value Ref Range Status  Specimen Description   Final    URINE, RANDOM Performed at [facility], [address] [place], [place] [address]    Special Requests   Final    NONE Performed at [facility], [address] [place], [place] [address]    Culture   Final    NO GROWTH Performed at [facility], [place] [address] [place] [address], [place], [place] [address]    Report Status 03/01/2019 FINAL  Final  SARS Coronavirus 2 (CEPHEID- Performed in [employer] hospital lab), Hosp Order     Status: None   Collection Time: 02/28/19  9:48 AM  Result Value Ref Range Status   SARS Coronavirus 2 NEGATIVE NEGATIVE Final    Comment: (NOTE) If result is NEGATIVE SARS-CoV-2 target nucleic acids are NOT DETECTED. The SARS-CoV-2 RNA is generally detectable in upper and lower  respiratory specimens during the acute phase of infection. The lowest  concentration of SARS-CoV-2 viral copies this assay can detect is 250  copies / mL. A negative result does not preclude SARS-CoV-2 infection  and should not be used as the sole basis for treatment or other  patient management decisions.  A negative result may occur with  improper specimen collection / handling, submission of specimen other  than nasopharyngeal swab, presence of viral mutation(s) within the  areas targeted by this assay, and inadequate number of viral copies  (<250 copies / mL). A negative result must be combined with clinical  observations, patient history, and epidemiological information. If result is POSITIVE SARS-CoV-2 target nucleic acids are DETECTED. The SARS-CoV-2 RNA is generally detectable in upper and lower  respiratory specimens dur ing the acute phase of infection.  Positive  results are indicative of active infection with SARS-CoV-2.  Clinical  correlation with patient history and other diagnostic information is  necessary to determine patient infection status.  Positive results do   not rule out bacterial infection or co-infection with other viruses. If result is PRESUMPTIVE POSTIVE SARS-CoV-2 nucleic acids MAY BE PRESENT.   A presumptive positive result was obtained on the submitted specimen  and confirmed on repeat testing.  While 2019 novel coronavirus  (SARS-CoV-2) nucleic acids may be present in the submitted sample  additional confirmatory testing may be necessary for epidemiological  and / or clinical management purposes  to differentiate between  SARS-CoV-2 and other Sarbecovirus currently known to infect humans.  If clinically indicated additional testing with an alternate test  methodology [phone]) is advised. The SARS-CoV-2 RNA is generally  detectable in upper and lower respiratory sp ecimens during the acute  phase of infection. The expected result is Negative. Fact Sheet for Patients:  StrictlyIdeas.no Fact Sheet for Healthcare Providers: BankingDealers.co.za This test is not yet approved or cleared by the [place] FDA and has been authorized for detection and/or diagnosis of SARS-CoV-2 by FDA under an Emergency Use Authorization (EUA).  This EUA will remain in effect (meaning this test can be used) for the duration of the COVID-19 declaration under Section 564(b)(1) of the Act, 21 U.S.C. section 360bbb-3(b)(1), unless the authorization is terminated or revoked sooner. Performed at [facility], [address] [place], [place] [address]   MRSA PCR Screening     Status: None   Collection Time: 02/28/19  2:48 PM  Result Value Ref Range Status   MRSA by PCR NEGATIVE NEGATIVE Final    Comment:        The GeneXpert MRSA Assay (FDA approved for NASAL specimens only), is one  component of a comprehensive MRSA colonization surveillance program. It is not intended to diagnose MRSA infection nor to guide or monitor treatment for MRSA infections. Performed at [facility], [address] [place], [place] [address]   CULTURE, BLOOD (ROUTINE X 2) w Reflex to ID Panel     Status: None (Preliminary result)   Collection Time: 03/05/19  9:56 AM  Result Value Ref Range Status   Specimen Description BLOOD LEFT HAND  Final   Special Requests   Final    BOTTLES DRAWN AEROBIC AND ANAEROBIC Blood Culture results may not be optimal due to an excessive volume of blood received in culture bottles   Culture   Final    NO GROWTH < 24 HOURS Performed at [facility], [address], [place], [place] [address]    Report Status PENDING  Incomplete  CULTURE, BLOOD (ROUTINE X 2) w Reflex to ID Panel     Status: None (Preliminary result)   Collection Time: 03/05/19  9:56 AM  Result Value Ref Range Status   Specimen Description BLOOD RIGHT FA  Final   Special Requests   Final    BOTTLES DRAWN AEROBIC AND ANAEROBIC Blood Culture adequate volume   Culture   Final    NO GROWTH < 24 HOURS Performed at [facility], [address], [place], [place] [address]    Report Status PENDING  Incomplete     Studies: No results found.  Scheduled Meds: . aspirin  81 mg Oral Daily  . enoxaparin (LOVENOX) injection  40 mg Subcutaneous Q24H  . feeding supplement (ENSURE ENLIVE)  237 mL Oral TID BM  . insulin aspart  0-5 Units Subcutaneous QHS  . insulin aspart  0-9 Units Subcutaneous TID WC  . insulin glargine  4 Units Subcutaneous Daily  . mouth rinse  15 mL Mouth Rinse BID  . rosuvastatin  40 mg Oral q1800   Continuous Infusions: . sodium chloride Stopped (03/05/19 2022)  . cefTRIAXone (ROCEPHIN)  IV Stopped (03/05/19 1922)    Assessment/Plan:  1.  Sepsis with Proteus vulgaris.  Likely the gangrenous toes as the source.  Demarcation starting to go up to the left foot.  Patient with low-grade temperature.  Repeat blood cultures yesterday so far negative.  Patient on Rocephin.  Patient has poor circulation and would need a revascularization procedure prior to  amputation in order for it to heal.  Patient will speak with palliative care on whether to proceed with surgical approach versus hospice. 2.  Severe peripheral vascular disease.  Angiogram showing poor blood supply to the lower extremities.  3.  Acute hypoxic respiratory failure.  Patient tapered off oxygen 4.  Acute myocardial infarction.  Conservative management as per cardiology.  Patient on aspirin.  Hold off on Brilinta because the patient may need a amputation.  High-dose Crestor started. 5.  Type 2 diabetes mellitus.  Sugars very labile depending on how much he is eating.  On low-dose Lantus 4 units with sliding scale.. 6.  Acute kidney injury normalized with IV fluids. 7.  Cocaine abuse 8.  Chronic atrial fibrillation.  We will have to discuss anticoagulation after procedure. 9.  History of pancreatic cancer status post Whipple procedure in 2018 10.  Hyponatremia.  This has normalized. 11.  Anemia.  Patient has received 2 units of packed red blood cells  12.  2 stage II decubitus on buttocks.  Local wound care   Code Status:     Code Status Orders  (  From admission, onward)         Start     Ordered   03/02/19 1454  Full code  Continuous     03/02/19 1454        Code Status History    Date Active Date Inactive Code Status Order ID Comments User Context   02/28/2019 1449 03/01/2019 0836 Full Code [id number]  Gorden Harms, MD Inpatient   02/28/2019 1449 02/28/2019 1449 Full Code [id number]  Gorden Harms, MD Inpatient   11/11/2018 0758 11/18/2018 1814 DNR [id number]  Loletha Grayer, MD ED   09/20/2018 1635 09/24/2018 1854 Full Code [id number]  Fritzi Mandes, MD ED   08/03/2018 1020 08/04/2018 1813 Full Code [id number]  Salary, Avel Peace, MD ED   01/09/2018 1025 01/11/2018 1932 Full Code [id number]  Harrie Foreman, MD Inpatient   09/05/2017 1559 09/11/2017 1928 Full Code [id number]  Fritzi Mandes, MD Inpatient   08/24/2017 0238 08/29/2017 2131 Full Code [id number]  Lance Coon, MD  Inpatient   08/10/2015 0822 08/11/2015 1947 Full Code [id number]  Harrie Foreman, MD Inpatient     Disposition Plan: TBD  Consultants:  Critical care specialist  Cardiology  Palliative care  Vascular surgery  Podiatry  Antibiotics:  Rocephin  Time spent: 27 minutes.  Spoke with patient's significant other on the phone. Spoke with palliative care.   Alyxis Grippi [employer]

## 2019-03-06 NOTE — Progress Notes (Signed)
[facility]  Daily Progress Note   Subjective  - 2 Days Post-Op  Still having a lot of pain in his left foot.  Temperature of 100.0 this morning.  Had a meeting with palliative care.  Initially wanted to have comfort care, and then later decided to have aggressive care per report.  When I discussed with the patient today he is still interested in having aggressive care which would include femoral endarterectomy and ultimately an amputation.  Objective Vitals:   03/05/19 1938 03/06/19 0423 03/06/19 0920 03/06/19 0921  BP: 107/69 119/77 124/73   Pulse: 88 83 98   Resp: 17 17 18    Temp: 99.3 F (37.4 C) 98.2 F (36.8 C) 100 F (37.8 C) 99.9 F (37.7 C)  TempSrc: Oral Oral Oral Oral  SpO2: 99% 98% 99%   Weight:      Height:        Intake/Output Summary (Last 24 hours) at 03/06/2019 1452 Last data filed at 03/06/2019 1330 Gross per 24 hour  Intake 807.54 ml  Output 1550 ml  Net -742.46 ml    PULM  CTAB CV  RRR VASC  left foot demarcating without palpable pulses.  Laboratory CBC    Component Value Date/Time   WBC 10.7 (H) 03/05/2019 0442   HGB 7.7 (L) 03/05/2019 0442   HCT 23.3 (L) 03/05/2019 0442   PLT 204 03/05/2019 0442    BMET    Component Value Date/Time   NA 138 03/05/2019 0442   K 3.8 03/05/2019 0442   CL 104 03/05/2019 0442   CO2 28 03/05/2019 0442   GLUCOSE 125 (H) 03/05/2019 0442   BUN 14 03/05/2019 0442   CREATININE 0.69 03/05/2019 0442   CALCIUM 7.3 (L) 03/05/2019 0442   GFRNONAA >60 03/05/2019 0442   GFRAA >60 03/05/2019 0442    Assessment/Planning: POD #2 s/p extensive left lower extremity percutaneous intervention   Still has significant disease at the femoral bifurcation and would require treatment at that level to even expect a below-knee amputation to heal at this point.  Foot is demarcating but appears dry.  Had a meeting with palliative care today and it sounds like he would like to proceed with aggressive care  at this point.  Blood cultures are pending from his previous bacteremia and would want to make sure that his blood cultures are negative before proceeding with vascular reconstruction  Have tentatively scheduled him for Monday afternoon for a left femoral endarterectomy.  This is pending the blood cultures and assuming he decides to continue aggressive care which he is leaning towards at this time.  Very complex patient with multiple ongoing issues  Again would expect the femoral endarterectomy to heal a below-knee amputation.  At this point, without that intervention would think he would need an above-knee amputation to heal.    Leotis Pain  03/06/2019, 2:52 PM

## 2019-03-06 NOTE — Progress Notes (Signed)
SUBJECTIVE: Pt denies chest pain but has left Leg pain.   Vitals:   03/05/19 1938 03/06/19 0423 03/06/19 0920 03/06/19 0921  BP: 107/69 119/77 124/73   Pulse: 88 83 98   Resp: 17 17 18    Temp: 99.3 F (37.4 C) 98.2 F (36.8 C) 100 F (37.8 C) 99.9 F (37.7 C)  TempSrc: Oral Oral Oral Oral  SpO2: 99% 98% 99%   Weight:      Height:        Intake/Output Summary (Last 24 hours) at 03/06/2019 1020 Last data filed at 03/06/2019 0947 Gross per 24 hour  Intake 807.54 ml  Output 650 ml  Net 157.54 ml    LABS: Basic Metabolic Panel: Recent Labs    03/04/19 0548 03/05/19 0442  NA 140 138  K 4.4 3.8  CL 106 104  CO2 27 28  GLUCOSE 384* 125*  BUN 17 14  CREATININE 0.74 0.69  CALCIUM 7.5* 7.3*   Liver Function Tests: Recent Labs    03/04/19 0548  AST 26  ALT 14  ALKPHOS 35*  BILITOT 0.5  PROT 5.6*  ALBUMIN 2.3*   No results for input(s): LIPASE, AMYLASE in the last 72 hours. CBC: Recent Labs    03/04/19 0548 03/05/19 0442  WBC 15.8* 10.7*  HGB 6.9* 7.7*  HCT 21.6* 23.3*  MCV 86.4 85.7  PLT 249 204   Cardiac Enzymes: No results for input(s): CKTOTAL, CKMB, CKMBINDEX, TROPONINI in the last 72 hours. BNP: Invalid input(s): POCBNP D-Dimer: No results for input(s): DDIMER in the last 72 hours. Hemoglobin A1C: No results for input(s): HGBA1C in the last 72 hours. Fasting Lipid Panel: No results for input(s): CHOL, HDL, LDLCALC, TRIG, CHOLHDL, LDLDIRECT in the last 72 hours. Thyroid Function Tests: No results for input(s): TSH, T4TOTAL, T3FREE, THYROIDAB in the last 72 hours.  Invalid input(s): FREET3 Anemia Panel: No results for input(s): VITAMINB12, FOLATE, FERRITIN, TIBC, IRON, RETICCTPCT in the last 72 hours.   PHYSICAL EXAM General Cachetic, weak, grimacing due to leg pain HEENT:  Normocephalic and atramatic Neck:  No JVD.  Lungs: Clear bilaterally to auscultation and percussion. Heart: HRRR . Normal S1 and S2 without gallops or murmurs.   Abdomen: Bowel sounds are positive, abdomen soft and non-tender  Msk:  Back normal, normal gait. Normal strength and tone for age. Extremities: No clubbing, cyanosis or edema.   Neuro: Alert and oriented X 3. Psych:  Lethargic  TELEMETRY: Not available  ASSESSMENT AND PLAN: Status post STEMI with inferoposterior myocardial infarction. No further chest pain today and patient is hospice care now with palliative care and advise treating conservatively.  Active Problems:   Respiratory failure ([place])    Jake Bathe, NP-C 03/06/2019 10:20 AM Cell: [phone]

## 2019-03-06 NOTE — Care Management Important Message (Signed)
Important Message  Patient Details  Name: Richard [name] MRN: [id number] Date of Birth: 1956/07/03   Medicare Important Message Given:  Yes    [name] 03/06/2019, 10:51 AM

## 2019-03-06 NOTE — Progress Notes (Addendum)
Noted on assessment pressure ulcer stage 2 to R coccyx and L medial buttock.  Foam dressing applied. Pt able to reposition himself. Refuses staff to reposition him qx2hrs. Educated pt about the importance of off loading to prevent further skin damage and assist with healing. Pt verbalized understanding. Dr Leslye Peer made aware. No new orders.

## 2019-03-06 NOTE — Progress Notes (Signed)
Inpatient Diabetes Program Recommendations  AACE/ADA: New Consensus Statement on Inpatient Glycemic Control (2015)  Target Ranges:  Prepandial:   less than 140 mg/dL      Peak postprandial:   less than 180 mg/dL (1-2 hours)      Critically ill patients:  140 - 180 mg/dL   Lab Results  Component Value Date   GLUCAP 284 (H) 03/06/2019   HGBA1C >15.5 (H) 02/28/2019    Review of Glycemic Control Results for ARISTIDIS, TALERICO (MRN [id number]) as of 03/06/2019 11:14  Ref. Range 03/05/2019 16:29 03/05/2019 20:47 03/06/2019 00:11 03/06/2019 04:24 03/06/2019 08:30  Glucose-Capillary Latest Ref Range: 70 - 99 mg/dL 218 (H) 204 (H) 199 (H) 289 (H) 284 (H)   Outpatient Diabetes medications:Levemir 17 units daily, Novolog 5 units TID with meals, Novolog 0-5 units QHS Current orders for Inpatient glycemic control:Lantus 4 units daily, Novolog 0-9 (sensitive) units TID with meals Inpatient Diabetes Program Recommendations:  May consider slight increase of Lantus to 6 units daily.   Thanks  Adah Perl, RN, BC-ADM Inpatient Diabetes Coordinator Pager [phone] (8a-5p)

## 2019-03-06 NOTE — Plan of Care (Signed)
  Problem: Education: Goal: Ability to describe self-care measures that may prevent or decrease complications (Diabetes Survival Skills Education) will improve Outcome: Progressing Goal: Individualized Educational Video(s) Outcome: Progressing   Problem: Cardiac: Goal: Ability to maintain an adequate cardiac output will improve Outcome: Progressing   Problem: Health Behavior/Discharge Planning: Goal: Ability to identify and utilize available resources and services will improve Outcome: Progressing Goal: Ability to manage health-related needs will improve Outcome: Progressing   Problem: Fluid Volume: Goal: Ability to achieve a balanced intake and output will improve Outcome: Progressing   Problem: Metabolic: Goal: Ability to maintain appropriate glucose levels will improve Outcome: Progressing   Problem: Nutritional: Goal: Maintenance of adequate nutrition will improve Outcome: Progressing Goal: Maintenance of adequate weight for body size and type will improve Outcome: Progressing   Problem: Respiratory: Goal: Will regain and/or maintain adequate ventilation Outcome: Progressing   Problem: Urinary Elimination: Goal: Ability to achieve and maintain adequate renal perfusion and functioning will improve Outcome: Progressing   Problem: Education: Goal: Knowledge of General Education information will improve Description: Including pain rating scale, medication(s)/side effects and non-pharmacologic comfort measures Outcome: Progressing   Problem: Health Behavior/Discharge Planning: Goal: Ability to manage health-related needs will improve Outcome: Progressing   Problem: Clinical Measurements: Goal: Ability to maintain clinical measurements within normal limits will improve Outcome: Progressing Goal: Will remain free from infection Outcome: Progressing Goal: Diagnostic test results will improve Outcome: Progressing Goal: Respiratory complications will improve Outcome:  Progressing Goal: Cardiovascular complication will be avoided Outcome: Progressing   Problem: Activity: Goal: Risk for activity intolerance will decrease Outcome: Progressing   Problem: Nutrition: Goal: Adequate nutrition will be maintained Outcome: Progressing   Problem: Coping: Goal: Level of anxiety will decrease Outcome: Progressing   Problem: Elimination: Goal: Will not experience complications related to bowel motility Outcome: Progressing Goal: Will not experience complications related to urinary retention Outcome: Progressing   Problem: Pain Managment: Goal: General experience of comfort will improve Outcome: Progressing   Problem: Safety: Goal: Ability to remain free from injury will improve Outcome: Progressing   Problem: Skin Integrity: Goal: Risk for impaired skin integrity will decrease Outcome: Progressing   

## 2019-03-06 NOTE — Progress Notes (Addendum)
Daily Progress Note   Patient Name: Richard [name]       Date: 03/06/2019 DOB: 04-09-1956  Age: 63 y.o. MRN#: [id number] Attending Physician: Loletha Grayer, MD Primary Care Physician: [facility] [place] Date: 02/28/2019  Reason for Consultation/Follow-up: Establishing goals of care  Subjective:  Per notes, HPOA completed for significant other [name] to be HPOA.   Patient is resting in bed. He has spoke with vascular surgery. Discussion of amputation. He is emotional. He initially states he wants to focus on comfort, does not want to suffer, and wants to go in peace. This was discussed with family members with plans for hospice referrral. Upon returning from going to get a MOST form, he states he feels God wants to him to keep going, that he did this all to himself, and to take the leg and do what you need to keep him alive. He states "I'm not going out like this."  He does state he would not want CPR or a ventilator.  He understands this will be a difficult road for him, but states he is ready, and willing to make lifestyle changes.   I completed a MOST form today and the signed original was placed in the chart. A photocopy was placed in the chart to be scanned into EMR. The patient outlined their wishes for the following treatment decisions:  Cardiopulmonary Resuscitation: Do Not Attempt Resuscitation (DNR/No CPR)  Medical Interventions: Limited Additional Interventions: Use medical treatment, IV fluids and cardiac monitoring as indicated, DO NOT USE intubation or mechanical ventilation. May consider use of less invasive airway support such as BiPAP or CPAP. Also provide comfort measures. Transfer to the hospital if indicated. Avoid intensive care.   Antibiotics:  Antibiotics if indicated  IV Fluids: IV fluids if indicated  Feeding Tube: Feeding tube for a defined trial period    Length of Stay: 6  Current Medications: Scheduled Meds:  . aspirin  81 mg Oral Daily  . enoxaparin (LOVENOX) injection  40 mg Subcutaneous Q24H  . feeding supplement (ENSURE ENLIVE)  237 mL Oral TID BM  . insulin aspart  0-5 Units Subcutaneous QHS  . insulin aspart  0-9 Units Subcutaneous TID WC  . insulin glargine  4 Units Subcutaneous Daily  . mouth rinse  15 mL Mouth Rinse BID  .  rosuvastatin  40 mg Oral q1800    Continuous Infusions: . sodium chloride Stopped (03/05/19 2022)  . cefTRIAXone (ROCEPHIN)  IV Stopped (03/05/19 1922)    PRN Meds: sodium chloride, acetaminophen, HYDROcodone-acetaminophen, morphine injection, naLOXone (NARCAN)  injection, ondansetron (ZOFRAN) IV  Physical Exam Skin:    General: Skin is warm and dry.  Neurological:     Mental Status: He is alert.             Vital Signs: BP 124/73 (BP Location: Left Arm)   Pulse 98   Temp 99.9 F (37.7 C) (Oral)   Resp 18   Ht 5\' 5"  (1.651 m)   Wt 57.3 kg   SpO2 99%   BMI 21.02 kg/m  SpO2: SpO2: 99 % O2 Device: O2 Device: Room Air O2 Flow Rate: O2 Flow Rate (L/min): 2 L/min  Intake/output summary:   Intake/Output Summary (Last 24 hours) at 03/06/2019 1047 Last data filed at 03/06/2019 4098 Gross per 24 hour  Intake 807.54 ml  Output 650 ml  Net 157.54 ml   LBM: Last BM Date: 03/03/19 Baseline Weight: Weight: 54 kg Most recent weight: Weight: 57.3 kg       Palliative Assessment/Data:    Flowsheet Rows     Most Recent Value  Intake Tab  Referral Department  Critical care  Unit at Time of Referral  ICU  Palliative Care Primary Diagnosis  Cancer  Date Notified  02/28/19  Palliative Care Type  New Palliative care  Reason for referral  Clarify Goals of Care  Date of Admission  02/28/19  Date first seen by Palliative Care  03/02/19  # of days Palliative referral response  time  2 Day(s)  # of days IP prior to Palliative referral  0  Clinical Assessment  Psychosocial & Spiritual Assessment  Palliative Care Outcomes      Patient Active Problem List   Diagnosis Date Noted  . Respiratory failure ([place]) 02/28/2019  . Diabetes with hyperosmolar coma ([place]) 11/11/2018  . A-fib ([place]) 09/20/2018  . Hyperglycemia 08/03/2018  . Protein-calorie malnutrition, severe 01/10/2018  . Sepsis ([place]) 01/09/2018  . DKA, type 2 ([place]) 09/05/2017  . Ileus ([place]) 08/24/2017  . Diabetes ([place]) 08/24/2017  . Atrial flutter ([place]) 08/24/2017  . CAD (coronary artery disease) 08/24/2017  . HTN (hypertension) 08/24/2017  . Atrial fibrillation ([place]) 08/24/2017  . Tobacco use disorder, severe, dependence 07/25/2017  . Wound dehiscence, surgical, initial encounter 06/24/2017  . Acute hematogenous osteomyelitis of right foot ([place]) 06/20/2017  . Adenocarcinoma of pancreas ([place]) 06/20/2017  . Esophagitis 06/20/2017  . Cellulitis of right lower extremity 06/10/2017  . Hypertension, poor control 06/10/2017  . Insulin dependent type 2 diabetes mellitus ([place]) 06/10/2017  . Chest pain 08/10/2015    Palliative Care Assessment & Plan    Recommendations/Plan:  Recommend psych consult to help with substance abuse issues as he wants to change his lifestyle.      Code Status:    Code Status Orders  (From admission, onward)         Start     Ordered   03/02/19 1454  Full code  Continuous     03/02/19 1454        Code Status History    Date Active Date Inactive Code Status Order ID Comments User Context   02/28/2019 1449 03/01/2019 0836 Full Code [id number]  Gorden Harms, MD Inpatient   02/28/2019 1449 02/28/2019 1449 Full Code [id number]  Salary, Avel Peace, MD Inpatient  11/11/2018 0758 11/18/2018 1814 DNR [id number]  Loletha Grayer, MD ED   09/20/2018 1635 09/24/2018 1854 Full Code [id number]  Fritzi Mandes, MD ED   08/03/2018 1020 08/04/2018 1813 Full Code [id number]  Salary,  Avel Peace, MD ED   01/09/2018 1025 01/11/2018 1932 Full Code [id number]  Harrie Foreman, MD Inpatient   09/05/2017 1559 09/11/2017 1928 Full Code [id number]  Fritzi Mandes, MD Inpatient   08/24/2017 0238 08/29/2017 2131 Full Code [id number]  Lance Coon, MD Inpatient   08/10/2015 0822 08/11/2015 1947 Full Code [id number]  Harrie Foreman, MD Inpatient       Prognosis:   Unable to determine  Discharge Planning:  To Be Determined   Thank you for allowing the Palliative Medicine Team to assist in the care of this patient.   Total Time 35 min Prolonged Time Billed  no       Greater than 50%  of this time was spent counseling and coordinating care related to the above assessment and plan.  Asencion Gowda, NP  Please contact Palliative Medicine Team phone at [phone] for questions and concerns.

## 2019-03-07 LAB — GLUCOSE, CAPILLARY
Glucose-Capillary: 285 mg/dL — ABNORMAL HIGH (ref 70–99)
Glucose-Capillary: 325 mg/dL — ABNORMAL HIGH (ref 70–99)
Glucose-Capillary: 374 mg/dL — ABNORMAL HIGH (ref 70–99)
Glucose-Capillary: 303 mg/dL — ABNORMAL HIGH (ref 70–99)
Glucose-Capillary: 220 mg/dL — ABNORMAL HIGH (ref 70–99)

## 2019-03-07 LAB — CBC
HCT: 22.3 % — ABNORMAL LOW (ref 39.0–52.0)
Hemoglobin: 7.3 g/dL — ABNORMAL LOW (ref 13.0–17.0)
MCH: 28.6 pg (ref 26.0–34.0)
MCHC: 32.7 g/dL (ref 30.0–36.0)
MCV: 87.5 fL (ref 80.0–100.0)
Platelets: 307 10*3/uL (ref 150–400)
RBC: 2.55 MIL/uL — ABNORMAL LOW (ref 4.22–5.81)
RDW: 15.6 % — ABNORMAL HIGH (ref 11.5–15.5)
WBC: 13.7 10*3/uL — ABNORMAL HIGH (ref 4.0–10.5)
nRBC: 0 % (ref 0.0–0.2)

## 2019-03-07 MED ORDER — INSULIN GLARGINE 100 UNIT/ML ~~LOC~~ SOLN
10.0000 [IU] | Freq: Every day | SUBCUTANEOUS | Status: DC
  Administered 2019-03-08: 08:00:00 10 [IU] via SUBCUTANEOUS
  Filled 2019-03-07 (×2): qty 0.1

## 2019-03-07 MED ORDER — HYDROCODONE-ACETAMINOPHEN 5-325 MG PO TABS
1.0000 | ORAL_TABLET | ORAL | Status: DC | PRN
  Administered 2019-03-07 – 2019-03-11 (×9): 1 via ORAL
  Filled 2019-03-07 (×10): qty 1

## 2019-03-07 MED ORDER — INSULIN ASPART 100 UNIT/ML ~~LOC~~ SOLN
5.0000 [IU] | Freq: Three times a day (TID) | SUBCUTANEOUS | Status: DC
  Administered 2019-03-07 – 2019-03-08 (×4): 5 [IU] via SUBCUTANEOUS
  Filled 2019-03-07 (×5): qty 1

## 2019-03-07 NOTE — Progress Notes (Signed)
PT Cancellation Note  Patient Details Name: Richard [name] MRN: [id number] DOB: 01-20-1956   Cancelled Treatment:    Reason Eval/Treat Not Completed: Patient declined, no reason specified.  Upon PT entering pt's room, pt refusing PT and reporting L LE pain (nurse reports recent pain meds).  Discussed option of ex's in bed or sitting on edge of bed but pt firmly refusing.  Upon further discussion of therapy with pt, pt reporting he did not want to participate in therapy until after his procedure on Monday.  D/t this, will discontinue current PT order. Nurse notified of above information and need for new PT order post procedure.   Please re-consult PT after procedure on Monday, as medically appropriate, and per pt's willingness to participate.    Leitha Bleak, PT 03/07/19, 10:08 AM [phone]

## 2019-03-07 NOTE — Progress Notes (Signed)
Patient ID: Richard [name], male   DOB: 1956/10/02, 63 y.o.   MRN: [id number]   [employer] [facility] PROGRESS NOTE  Richard [name] [id number] DOB: 1956-03-20 DOA: 02/28/2019 PCP: [facility] [place]  HPI/Subjective: Patient states left foot pain.  Objective: Vitals:   03/06/19 2020 03/07/19 0305  BP: (!) 98/51 108/63  Pulse: 91 84  Resp: 17 17  Temp: 99.2 F (37.3 C) 98.5 F (36.9 C)  SpO2: 100% 98%    Filed Weights   02/28/19 1443 03/01/19 0416 03/04/19 1248  Weight: 55 kg 57.3 kg 57.3 kg    ROS: Review of Systems  Constitutional: Negative for chills and fever.  Eyes: Negative for blurred vision.  Respiratory: Negative for cough and shortness of breath.   Cardiovascular: Negative for chest pain.  Gastrointestinal: Negative for abdominal pain, constipation, diarrhea, nausea and vomiting.  Genitourinary: Negative for dysuria.  Musculoskeletal: Positive for joint pain.  Neurological: Negative for dizziness and headaches.   Exam: Physical Exam  HENT:  Nose: No mucosal edema.  Mouth/Throat: No oropharyngeal exudate or posterior oropharyngeal edema.  Eyes: Pupils are equal, round, and reactive to light. Conjunctivae and lids are normal.  Neck: No JVD present. Carotid bruit is not present. No edema present. No thyroid mass and no thyromegaly present.  Cardiovascular: S1 normal and S2 normal. Exam reveals no gallop.  No murmur heard. Respiratory: No respiratory distress. He has no wheezes. He has no rhonchi. He has no rales.  GI: Soft. Bowel sounds are normal. There is no abdominal tenderness.  Musculoskeletal:     Right ankle: He exhibits no swelling.     Left ankle: He exhibits no swelling.     Comments: Gangrenous left first and second toe  Lymphadenopathy:    He has no cervical adenopathy.  Neurological: He is alert.  Moves all extremities on his own  Skin: Skin is warm. Nails show no clubbing.  Left first and second toe gangrenous.  Demarcation starting  to go up the left foot now.  Has some blistering on the left foot.  Prior amputation of the right trans-met. 2 stage II decubiti noninfected on the buttock sacral area.  Psychiatric: He has a normal mood and affect.      Data Reviewed: Basic Metabolic Panel: Recent Labs  Lab 02/28/19 2038  03/01/19 0724 03/02/19 0423 03/03/19 0521 03/04/19 0548 03/05/19 0442  NA 147*   < > 153* 154* 154* 140 138  K 3.5   < > 3.2* 3.4* 3.7 4.4 3.8  CL 110   < > 115* 120* 118* 106 104  CO2 15*   < > 25 27 29 27 28   GLUCOSE 425*   < > 180* 167* 222* 384* 125*  BUN 83*   < > 86* 46* 24* 17 14  CREATININE 2.69*   < > 1.80* 0.86 0.74 0.74 0.69  CALCIUM 7.7*   < > 7.6* 7.9* 8.0* 7.5* 7.3*  MG 2.2  --  2.1  --   --   --   --   PHOS 3.8  --  1.7*  --   --   --   --    < > = values in this interval not displayed.   Liver Function Tests: Recent Labs  Lab 03/02/19 0423 03/04/19 0548  AST 45* 26  ALT 16 14  ALKPHOS 30* 35*  BILITOT 0.6 0.5  PROT 6.0* 5.6*  ALBUMIN 2.5* 2.3*   No results for input(s): LIPASE, AMYLASE in the last 168  hours. CBC: Recent Labs  Lab 03/02/19 0423 03/03/19 0521 03/03/19 1823 03/04/19 0548 03/05/19 0442 03/07/19 0613  WBC 20.2* 17.7*  --  15.8* 10.7* 13.7*  HGB 6.8* 6.4* 7.3* 6.9* 7.7* 7.3*  HCT 20.8* 20.5* 22.9* 21.6* 23.3* 22.3*  MCV 82.2 86.1  --  86.4 85.7 87.5  PLT 314 310  --  249 204 307   Cardiac Enzymes: Recent Labs  Lab 02/28/19 1550 02/28/19 1831 03/01/19 0038 03/01/19 0724  TROPONINI 2.71* 4.13* 10.66* 14.28*    CBG: Recent Labs  Lab 03/06/19 1817 03/06/19 2104 03/07/19 0305 03/07/19 0748 03/07/19 1139  GLUCAP 310* 318* 285* 325* 374*    Recent Results (from the past 240 hour(s))  Blood Culture (routine x 2)     Status: Abnormal   Collection Time: 02/28/19  9:48 AM  Result Value Ref Range Status   Specimen Description   Final    BLOOD RIGHT ANTECUBITAL Performed at [facility], [address] [place], [place]  [address]    Special Requests   Final    BOTTLES DRAWN AEROBIC AND ANAEROBIC Blood Culture results may not be optimal due to an excessive volume of blood received in culture bottles Performed at [facility], [address] [place], [place] [address]    Culture (A)  Final    PROTEUS VULGARIS CRITICAL RESULT CALLED TO, READ BACK BY AND VERIFIED WITH:  PHARMD ASAJAH D [id number] FCP GROWTH RECOVERED FROM AEROBIC BOTTLE ONLY. Performed at [facility], [place] [address] [place], [place] [address]    Report Status 03/03/2019 FINAL  Final   Organism ID, Bacteria PROTEUS VULGARIS  Final      Susceptibility   Proteus vulgaris - MIC*    AMPICILLIN >=32 RESISTANT Resistant     CEFAZOLIN >=64 RESISTANT Resistant     CEFEPIME <=1 SENSITIVE Sensitive     CEFTAZIDIME <=1 SENSITIVE Sensitive     CEFTRIAXONE <=1 SENSITIVE Sensitive     CIPROFLOXACIN <=0.25 SENSITIVE Sensitive     GENTAMICIN <=1 SENSITIVE Sensitive     IMIPENEM 2 SENSITIVE Sensitive     TRIMETH/SULFA <=20 SENSITIVE Sensitive     AMPICILLIN/SULBACTAM 4 SENSITIVE Sensitive     PIP/TAZO <=4 SENSITIVE Sensitive     * PROTEUS VULGARIS  Blood Culture (routine x 2)     Status: None   Collection Time: 02/28/19  9:48 AM  Result Value Ref Range Status   Specimen Description BLOOD RIGHT ANTECUBITAL  Final   Special Requests   Final    BOTTLES DRAWN AEROBIC AND ANAEROBIC Blood Culture adequate volume   Culture   Final    NO GROWTH 5 DAYS Performed at [facility], [address], [place], [place] [address]    Report Status 03/05/2019 FINAL  Final  Urine culture     Status: None   Collection Time: 02/28/19  9:48 AM  Result Value Ref Range Status   Specimen Description   Final    URINE, RANDOM Performed at [facility], [address], [place], [place] [address]    Special Requests   Final    NONE Performed at [facility], [address] [place], [place] [address]    Culture   Final     NO GROWTH Performed at [facility], [place] [address], [place], [place] [address]    Report Status 03/01/2019 FINAL  Final  SARS Coronavirus 2 (CEPHEID- Performed in [facility] hospital lab), [facility]  Status: None   Collection Time: 02/28/19  9:48 AM  Result Value Ref Range Status   SARS Coronavirus 2 NEGATIVE NEGATIVE Final    Comment: (NOTE) If result is NEGATIVE SARS-CoV-2 target nucleic acids are NOT DETECTED. The SARS-CoV-2 RNA is generally detectable in upper and lower  respiratory specimens during the acute phase of infection. The lowest  concentration of SARS-CoV-2 viral copies this assay can detect is 250  copies / mL. A negative result does not preclude SARS-CoV-2 infection  and should not be used as the sole basis for treatment or other  patient management decisions.  A negative result may occur with  improper specimen collection / handling, submission of specimen other  than nasopharyngeal swab, presence of viral mutation(s) within the  areas targeted by this assay, and inadequate number of viral copies  (<250 copies / mL). A negative result must be combined with clinical  observations, patient history, and epidemiological information. If result is POSITIVE SARS-CoV-2 target nucleic acids are DETECTED. The SARS-CoV-2 RNA is generally detectable in upper and lower  respiratory specimens dur ing the acute phase of infection.  Positive  results are indicative of active infection with SARS-CoV-2.  Clinical  correlation with patient history and other diagnostic information is  necessary to determine patient infection status.  Positive results do  not rule out bacterial infection or co-infection with other viruses. If result is PRESUMPTIVE POSTIVE SARS-CoV-2 nucleic acids MAY BE PRESENT.   A presumptive positive result was obtained on the submitted specimen  and confirmed on repeat testing.  While 2019 novel coronavirus  (SARS-CoV-2) nucleic acids may be present in  the submitted sample  additional confirmatory testing may be necessary for epidemiological  and / or clinical management purposes  to differentiate between  SARS-CoV-2 and other Sarbecovirus currently known to infect humans.  If clinically indicated additional testing with an alternate test  methodology [phone]) is advised. The SARS-CoV-2 RNA is generally  detectable in upper and lower respiratory sp ecimens during the acute  phase of infection. The expected result is Negative. Fact Sheet for Patients:  StrictlyIdeas.no Fact Sheet for Healthcare Providers: BankingDealers.co.za This test is not yet approved or cleared by the [place] FDA and has been authorized for detection and/or diagnosis of SARS-CoV-2 by FDA under an Emergency Use Authorization (EUA).  This EUA will remain in effect (meaning this test can be used) for the duration of the COVID-19 declaration under Section 564(b)(1) of the Act, 21 U.S.C. section 360bbb-3(b)(1), unless the authorization is terminated or revoked sooner. Performed at [facility], [address] [place], [place] [address]   MRSA PCR Screening     Status: None   Collection Time: 02/28/19  2:48 PM  Result Value Ref Range Status   MRSA by PCR NEGATIVE NEGATIVE Final    Comment:        The GeneXpert MRSA Assay (FDA approved for NASAL specimens only), is one component of a comprehensive MRSA colonization surveillance program. It is not intended to diagnose MRSA infection nor to guide or monitor treatment for MRSA infections. Performed at [facility], [address] [place], [place] [address]   CULTURE, BLOOD (ROUTINE X 2) w Reflex to ID Panel     Status: None (Preliminary result)   Collection Time: 03/05/19  9:56 AM  Result Value Ref Range Status   Specimen Description BLOOD LEFT HAND  Final   Special Requests   Final    BOTTLES DRAWN AEROBIC AND ANAEROBIC Blood Culture  results may not be optimal due to an excessive volume of blood received in culture bottles   Culture   Final    NO GROWTH 2 DAYS Performed at [facility], [address] [place], [place] [address]    Report Status PENDING  Incomplete  CULTURE, BLOOD (ROUTINE X 2) w Reflex to ID Panel     Status: None (Preliminary result)   Collection Time: 03/05/19  9:56 AM  Result Value Ref Range Status   Specimen Description BLOOD RIGHT FA  Final   Special Requests   Final    BOTTLES DRAWN AEROBIC AND ANAEROBIC Blood Culture adequate volume   Culture   Final    NO GROWTH 2 DAYS Performed at [facility], [address] [place] [address], [place], [place] [address]    Report Status PENDING  Incomplete     Studies: No results found.  Scheduled Meds: . aspirin  81 mg Oral Daily  . enoxaparin (LOVENOX) injection  40 mg Subcutaneous Q24H  . feeding supplement (ENSURE ENLIVE)  237 mL Oral TID BM  . insulin aspart  0-5 Units Subcutaneous QHS  . insulin aspart  0-9 Units Subcutaneous TID WC  . insulin aspart  5 Units Subcutaneous TID WC  . [START ON 03/08/2019] insulin glargine  10 Units Subcutaneous Daily  . mouth rinse  15 mL Mouth Rinse BID  . rosuvastatin  40 mg Oral q1800  . sodium chloride flush  10 mL Intravenous Q12H   Continuous Infusions: . sodium chloride 250 mL (03/06/19 1818)  . cefTRIAXone (ROCEPHIN)  IV 2 g (03/06/19 1824)    Assessment/Plan:  1.  Sepsis with Proteus vulgaris.  Likely the gangrenous toes as the source.  Demarcation starting to go up to the left foot.  Patient with low-grade temperature.  Repeat blood cultures so far negative.  Patient on Rocephin.  Patient has poor circulation and would need a revascularization procedure prior to amputation in order for it to heal.  Patient prefers surgery at this time.  2.  Severe peripheral vascular disease.  Angiogram showing poor blood supply to the lower extremities. L CFA endarterectomy planned for Monday.  3.  Acute  hypoxic respiratory failure.  Patient tapered off oxygen 4.  Acute myocardial infarction.  Conservative management as per cardiology.  Patient on aspirin.  Hold off on Brilinta because the patient may need a amputation.  High-dose Crestor started. 5.  Hyperglycemia due to type 2 diabetes mellitus.   Increase Lantus to 10 units with sliding scale. Add novolog 5 unit AC. 6.  Acute kidney injury normalized with IV fluids. 7.  Cocaine abuse 8.  Chronic atrial fibrillation.  We will have to discuss anticoagulation after procedure. 9.  History of pancreatic cancer status post Whipple procedure in 2018 10.  Hypernatremia.  This has normalized. 11.  Anemia of chronic disease.  Patient has received 2 units of packed red blood cells. Hb down to 7.3. no active bleeding. 12.  2 stage II decubitus on buttocks.  Local wound care  Code Status:     Code Status Orders  (From admission, onward)         Start     Ordered   03/02/19 1454  Full code  Continuous     03/02/19 1454        Code Status History    Date Active Date Inactive Code Status Order ID Comments User Context   02/28/2019 1449 03/01/2019 0836 Full Code [id number]  Salary, Avel Peace, MD Inpatient  02/28/2019 1449 02/28/2019 1449 Full Code [id number]  Gorden Harms, MD Inpatient   11/11/2018 0758 11/18/2018 1814 DNR [id number]  Loletha Grayer, MD ED   09/20/2018 1635 09/24/2018 1854 Full Code [id number]  Fritzi Mandes, MD ED   08/03/2018 1020 08/04/2018 1813 Full Code [id number]  Salary, Avel Peace, MD ED   01/09/2018 1025 01/11/2018 1932 Full Code [id number]  Harrie Foreman, MD Inpatient   09/05/2017 1559 09/11/2017 1928 Full Code [id number]  Fritzi Mandes, MD Inpatient   08/24/2017 0238 08/29/2017 2131 Full Code [id number]  Lance Coon, MD Inpatient   08/10/2015 0822 08/11/2015 1947 Full Code [id number]  Harrie Foreman, MD Inpatient     Disposition Plan: TBD  Consultants:  Critical care specialist  Cardiology  Palliative  care  Vascular surgery  Podiatry  Antibiotics:  Rocephin  Time spent: 35 minutes.  Spoke with patient's significant other Ms. Lewis on the phone. [name]  [employer]

## 2019-03-07 NOTE — Progress Notes (Signed)
Subjective: Interval History: has complaints left foot pain, ongoing, s/p intervention 03/04/2019.Marland Kitchen   Objective: Vital signs in last 24 hours: Temp:  [98.5 F (36.9 C)-100.1 F (37.8 C)] 98.5 F (36.9 C) (05/16 0305) Pulse Rate:  [84-91] 84 (05/16 0305) Resp:  [17] 17 (05/16 0305) BP: (98-113)/(51-63) 108/63 (05/16 0305) SpO2:  [98 %-100 %] 98 % (05/16 0305)  Intake/Output from previous day: 05/15 0701 - 05/16 0700 In: 370 [P.O.:360; I.V.:10] Out: 900 [Urine:900] Intake/Output this shift: Total I/O In: 240 [P.O.:240] Out: 500 [Urine:500]  General appearance: alert, cooperative and mild distress Extremities: no insertion site complication; L foot wound wrapped  Lab Results: Recent Labs    03/05/19 0442 03/07/19 0613  WBC 10.7* 13.7*  HGB 7.7* 7.3*  HCT 23.3* 22.3*  PLT 204 307   BMET Recent Labs    03/05/19 0442  NA 138  K 3.8  CL 104  CO2 28  GLUCOSE 125*  BUN 14  CREATININE 0.69  CALCIUM 7.3*    Studies/Results: Ct Head Wo Contrast  Result Date: 02/28/2019 CLINICAL DATA:  63 year old male with altered mental status EXAM: CT HEAD WITHOUT CONTRAST TECHNIQUE: Contiguous axial images were obtained from the base of the skull through the vertex without intravenous contrast. COMPARISON:  11/11/2018, 09/20/2018 FINDINGS: Brain: No acute intracranial hemorrhage. No midline shift or mass effect. Gray-white differentiation maintained. Redemonstration of mild periventricular white matter disease. Unremarkable appearance of the ventricular system. Vascular: Intracranial atherosclerosis Skull: No acute fracture.  No aggressive bone lesion identified. Sinuses/Orbits: Unremarkable appearance of the orbits. Mastoid air cells clear. No middle ear effusion. No significant sinus disease. Other: None IMPRESSION: No acute intracranial abnormality. 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   On:  02/28/2019 12:34   Dg Chest Port 1 View  Result Date: 03/02/2019 CLINICAL DATA:  Respiratory failure. EXAM: PORTABLE CHEST 1 VIEW COMPARISON:  Radiograph of Feb 28, 2019. FINDINGS: The heart size and mediastinal contours are within normal limits. Endotracheal tube has been removed. Nasogastric tube tip remains in proximal stomach. Right internal jugular catheter is unchanged. No pneumothorax or pleural effusion is noted. Both lungs are clear. The visualized skeletal structures are unremarkable. IMPRESSION: Endotracheal tube has been removed. Stable position of right internal jugular catheter and nasogastric tube. No acute cardiopulmonary abnormality seen. Aortic Atherosclerosis (ICD10-I70.0). Electronically Signed   By: Marijo Conception M.D.   On: 03/02/2019 07:36   Dg Chest Port 1 View  Result Date: 02/28/2019 CLINICAL DATA:  Central line placement, endotracheal and orogastric tube placement. EXAM: PORTABLE CHEST 1 VIEW COMPARISON:  Radiograph of November 11, 2018. FINDINGS: The heart size and mediastinal contours are within normal limits. Endotracheal and nasogastric tubes are in grossly good position. Right internal jugular catheter is noted with distal tip in expected position of the SVC. No pneumothorax or pleural effusion is noted. Both lungs are clear. The visualized skeletal structures are unremarkable. IMPRESSION: Endotracheal and nasogastric tubes are in grossly good position. Right internal jugular catheter is in good position. No acute cardiopulmonary abnormality seen. Electronically Signed   By: Marijo Conception M.D.   On: 02/28/2019 10:23   Anti-infectives: Anti-infectives (From admission, onward)   Start     Dose/Rate Route Frequency Ordered Stop   03/04/19 1214  ceFAZolin (ANCEF) 2-4 GM/100ML-% IVPB    Note to Pharmacy:  Despina Arias  : cabinet override      03/04/19 1214 03/04/19 1900   03/04/19 1210  ceFAZolin (ANCEF) IVPB 2g/100 mL premix  Status:  Discontinued     2 g 200 mL/hr  over 30 Minutes Intravenous 30 min pre-op 03/04/19 1211 03/04/19 1653   03/03/19 1800  cefTRIAXone (ROCEPHIN) 1 g in sodium chloride 0.9 % 100 mL IVPB  Status:  Discontinued     1 g 200 mL/hr over 30 Minutes Intravenous Every 24 hours 03/03/19 1217 03/03/19 1230   03/03/19 1800  cefTRIAXone (ROCEPHIN) 2 g in sodium chloride 0.9 % 100 mL IVPB     2 g 200 mL/hr over 30 Minutes Intravenous Every 24 hours 03/03/19 1230 03/08/19 1759   03/02/19 1100  meropenem (MERREM) 1 g in sodium chloride 0.9 % 100 mL IVPB  Status:  Discontinued     1 g 200 mL/hr over 30 Minutes Intravenous Every 8 hours 03/02/19 1035 03/03/19 1217   03/01/19 1000  ceFEPIme (MAXIPIME) 2 g in sodium chloride 0.9 % 100 mL IVPB  Status:  Discontinued    Note to Pharmacy:  Pharmacy to dose   2 g 200 mL/hr over 30 Minutes Intravenous Every 12 hours 03/01/19 0742 03/01/19 0836   03/01/19 0800  vancomycin (VANCOCIN) IVPB 1000 mg/200 mL premix  Status:  Discontinued     1,000 mg 200 mL/hr over 60 Minutes Intravenous Every 36 hours 03/01/19 0735 03/01/19 0836   02/28/19 2200  ceFEPIme (MAXIPIME) 1 g in sodium chloride 0.9 % 100 mL IVPB  Status:  Discontinued    Note to Pharmacy:  Pharmacy to dose   1 g 200 mL/hr over 30 Minutes Intravenous Every 12 hours 02/28/19 1321 03/01/19 0742   02/28/19 1352  vancomycin variable dose per unstable renal function (pharmacist dosing)  Status:  Discontinued      Does not apply See admin instructions 02/28/19 1354 03/01/19 0735   02/28/19 1330  vancomycin (VANCOCIN) IVPB 1000 mg/200 mL premix  Status:  Discontinued    Note to Pharmacy:  Pharmacy to dose   1,000 mg 200 mL/hr over 60 Minutes Intravenous  Once 02/28/19 1321 02/28/19 1354   02/28/19 0900  ceFEPIme (MAXIPIME) 2 g in sodium chloride 0.9 % 100 mL IVPB     2 g 200 mL/hr over 30 Minutes Intravenous  Once 02/28/19 0856 02/28/19 1100   02/28/19 0900  vancomycin (VANCOCIN) IVPB 1000 mg/200 mL premix     1,000 mg 200 mL/hr over 60 Minutes  Intravenous  Once 02/28/19 0856 02/28/19 1143      Assessment/Plan: s/p Procedure(s): Lower Extremity Angiography (Left) Continue ABX therapy due to complicated left foot infection with gangrene; is post intervention reestablishing patency to the foot on 03/04/2019 but runoff still an issue with sluggish flow post intervention. L CFA endarterectomy planned for Monday, pending elimination of proteus vulgaris bacteremia. All questions answered.   LOS: 7 days   Adamari Frede A Sylena Lotter 03/07/2019, 10:29 AM

## 2019-03-07 NOTE — Plan of Care (Signed)
  Problem: Education: Goal: Ability to describe self-care measures that may prevent or decrease complications (Diabetes Survival Skills Education) will improve Outcome: Progressing Goal: Individualized Educational Video(s) Outcome: Progressing   Problem: Cardiac: Goal: Ability to maintain an adequate cardiac output will improve Outcome: Progressing   Problem: Health Behavior/Discharge Planning: Goal: Ability to identify and utilize available resources and services will improve Outcome: Progressing Goal: Ability to manage health-related needs will improve Outcome: Progressing   Problem: Fluid Volume: Goal: Ability to achieve a balanced intake and output will improve Outcome: Progressing   Problem: Metabolic: Goal: Ability to maintain appropriate glucose levels will improve Outcome: Progressing   Problem: Nutritional: Goal: Maintenance of adequate nutrition will improve Outcome: Progressing Goal: Maintenance of adequate weight for body size and type will improve Outcome: Progressing   Problem: Respiratory: Goal: Will regain and/or maintain adequate ventilation Outcome: Progressing   Problem: Urinary Elimination: Goal: Ability to achieve and maintain adequate renal perfusion and functioning will improve Outcome: Progressing   Problem: Education: Goal: Knowledge of General Education information will improve Description: Including pain rating scale, medication(s)/side effects and non-pharmacologic comfort measures Outcome: Progressing   Problem: Health Behavior/Discharge Planning: Goal: Ability to manage health-related needs will improve Outcome: Progressing   Problem: Clinical Measurements: Goal: Ability to maintain clinical measurements within normal limits will improve Outcome: Progressing Goal: Will remain free from infection Outcome: Progressing Goal: Diagnostic test results will improve Outcome: Progressing Goal: Respiratory complications will improve Outcome:  Progressing Goal: Cardiovascular complication will be avoided Outcome: Progressing   Problem: Activity: Goal: Risk for activity intolerance will decrease Outcome: Progressing   Problem: Nutrition: Goal: Adequate nutrition will be maintained Outcome: Progressing   Problem: Coping: Goal: Level of anxiety will decrease Outcome: Progressing   Problem: Elimination: Goal: Will not experience complications related to bowel motility Outcome: Progressing Goal: Will not experience complications related to urinary retention Outcome: Progressing   Problem: Pain Managment: Goal: General experience of comfort will improve Outcome: Progressing   Problem: Safety: Goal: Ability to remain free from injury will improve Outcome: Progressing   Problem: Skin Integrity: Goal: Risk for impaired skin integrity will decrease Outcome: Progressing   

## 2019-03-08 LAB — GLUCOSE, CAPILLARY
Glucose-Capillary: 260 mg/dL — ABNORMAL HIGH (ref 70–99)
Glucose-Capillary: 277 mg/dL — ABNORMAL HIGH (ref 70–99)
Glucose-Capillary: 369 mg/dL — ABNORMAL HIGH (ref 70–99)
Glucose-Capillary: 269 mg/dL — ABNORMAL HIGH (ref 70–99)
Glucose-Capillary: 205 mg/dL — ABNORMAL HIGH (ref 70–99)

## 2019-03-08 LAB — CBC
HCT: 23.1 % — ABNORMAL LOW (ref 39.0–52.0)
Hemoglobin: 7.4 g/dL — ABNORMAL LOW (ref 13.0–17.0)
MCH: 27.9 pg (ref 26.0–34.0)
MCHC: 32 g/dL (ref 30.0–36.0)
MCV: 87.2 fL (ref 80.0–100.0)
Platelets: 384 10*3/uL (ref 150–400)
RBC: 2.65 MIL/uL — ABNORMAL LOW (ref 4.22–5.81)
RDW: 15.6 % — ABNORMAL HIGH (ref 11.5–15.5)
WBC: 15.8 10*3/uL — ABNORMAL HIGH (ref 4.0–10.5)
nRBC: 0.3 % — ABNORMAL HIGH (ref 0.0–0.2)

## 2019-03-08 MED ORDER — CHLORHEXIDINE GLUCONATE CLOTH 2 % EX PADS: 6.0000 | MEDICATED_PAD | Freq: Once | CUTANEOUS | Status: DC

## 2019-03-08 MED ORDER — SODIUM CHLORIDE 0.9 % IV SOLN
2.0000 g | INTRAVENOUS | Status: DC
  Administered 2019-03-08 – 2019-03-09 (×2): 2 g via INTRAVENOUS
  Filled 2019-03-08: qty 2
  Filled 2019-03-08: qty 20
  Filled 2019-03-08: qty 2

## 2019-03-08 MED ORDER — SODIUM CHLORIDE 0.9 % IV SOLN: INTRAVENOUS | Status: DC

## 2019-03-08 MED ORDER — CEFAZOLIN SODIUM-DEXTROSE 2-4 GM/100ML-% IV SOLN: 2.0000 g | INTRAVENOUS | Status: DC

## 2019-03-08 MED ORDER — CEFAZOLIN SODIUM-DEXTROSE 2-4 GM/100ML-% IV SOLN
2.0000 g | INTRAVENOUS | Status: DC
  Filled 2019-03-08: qty 100

## 2019-03-08 NOTE — Progress Notes (Signed)
Subjective: Interval History: no new issues. Pain in the left foot as before   Objective: Vital signs in last 24 hours: Temp:  [98.8 F (37.1 C)-99 F (37.2 C)] 98.9 F (37.2 C) (05/17 0343) Pulse Rate:  [85-94] 85 (05/17 0343) Resp:  [16-18] 16 (05/17 0343) BP: (115-128)/(64-80) 124/80 (05/17 0343) SpO2:  [98 %-100 %] 98 % (05/17 0343)  Intake/Output from previous day: 05/16 0701 - 05/17 0700 In: 850 [P.O.:600; I.V.:10] Out: 2025 [Urine:2025] Intake/Output this shift: No intake/output data recorded.  General appearance: alert, cooperative and no distress no accessory muscle use  Skin with good color and turgor Left foot without change; nonviable  Lab Results: Recent Labs    03/07/19 0613 03/08/19 0422  WBC 13.7* 15.8*  HGB 7.3* 7.4*  HCT 22.3* 23.1*  PLT 307 384   BMET No results for input(s): NA, K, CL, CO2, GLUCOSE, BUN, CREATININE, CALCIUM in the last 72 hours.  Studies/Results: Ct Head Wo Contrast  Result Date: 02/28/2019 CLINICAL DATA:  63 year old male with altered mental status EXAM: CT HEAD WITHOUT CONTRAST TECHNIQUE: Contiguous axial images were obtained from the base of the skull through the vertex without intravenous contrast. COMPARISON:  11/11/2018, 09/20/2018 FINDINGS: Brain: No acute intracranial hemorrhage. No midline shift or mass effect. Gray-white differentiation maintained. Redemonstration of mild periventricular white matter disease. Unremarkable appearance of the ventricular system. Vascular: Intracranial atherosclerosis Skull: No acute fracture.  No aggressive bone lesion identified. Sinuses/Orbits: Unremarkable appearance of the orbits. Mastoid air cells clear. No middle ear effusion. No significant sinus disease. Other: None IMPRESSION: No acute intracranial abnormality. 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   On: 02/28/2019 12:34   Dg Chest Port 1  View  Result Date: 03/02/2019 CLINICAL DATA:  Respiratory failure. EXAM: PORTABLE CHEST 1 VIEW COMPARISON:  Radiograph of Feb 28, 2019. FINDINGS: The heart size and mediastinal contours are within normal limits. Endotracheal tube has been removed. Nasogastric tube tip remains in proximal stomach. Right internal jugular catheter is unchanged. No pneumothorax or pleural effusion is noted. Both lungs are clear. The visualized skeletal structures are unremarkable. IMPRESSION: Endotracheal tube has been removed. Stable position of right internal jugular catheter and nasogastric tube. No acute cardiopulmonary abnormality seen. Aortic Atherosclerosis (ICD10-I70.0). Electronically Signed   By: Marijo Conception M.D.   On: 03/02/2019 07:36   Dg Chest Port 1 View  Result Date: 02/28/2019 CLINICAL DATA:  Central line placement, endotracheal and orogastric tube placement. EXAM: PORTABLE CHEST 1 VIEW COMPARISON:  Radiograph of November 11, 2018. FINDINGS: The heart size and mediastinal contours are within normal limits. Endotracheal and nasogastric tubes are in grossly good position. Right internal jugular catheter is noted with distal tip in expected position of the SVC. No pneumothorax or pleural effusion is noted. Both lungs are clear. The visualized skeletal structures are unremarkable. IMPRESSION: Endotracheal and nasogastric tubes are in grossly good position. Right internal jugular catheter is in good position. No acute cardiopulmonary abnormality seen. Electronically Signed   By: Marijo Conception M.D.   On: 02/28/2019 10:23   Anti-infectives: Anti-infectives (From admission, onward)   Start     Dose/Rate Route Frequency Ordered Stop   03/04/19 1214  ceFAZolin (ANCEF) 2-4 GM/100ML-% IVPB    Note to Pharmacy:  Despina Arias  : cabinet override      03/04/19 1214 03/04/19 1900   03/04/19 1210  ceFAZolin (ANCEF) IVPB 2g/100 mL premix  Status:  Discontinued  2 g 200 mL/hr over 30 Minutes Intravenous 30 min  pre-op 03/04/19 1211 03/04/19 1653   03/03/19 1800  cefTRIAXone (ROCEPHIN) 1 g in sodium chloride 0.9 % 100 mL IVPB  Status:  Discontinued     1 g 200 mL/hr over 30 Minutes Intravenous Every 24 hours 03/03/19 1217 03/03/19 1230   03/03/19 1800  cefTRIAXone (ROCEPHIN) 2 g in sodium chloride 0.9 % 100 mL IVPB     2 g 200 mL/hr over 30 Minutes Intravenous Every 24 hours 03/03/19 1230 03/07/19 1739   03/02/19 1100  meropenem (MERREM) 1 g in sodium chloride 0.9 % 100 mL IVPB  Status:  Discontinued     1 g 200 mL/hr over 30 Minutes Intravenous Every 8 hours 03/02/19 1035 03/03/19 1217   03/01/19 1000  ceFEPIme (MAXIPIME) 2 g in sodium chloride 0.9 % 100 mL IVPB  Status:  Discontinued    Note to Pharmacy:  Pharmacy to dose   2 g 200 mL/hr over 30 Minutes Intravenous Every 12 hours 03/01/19 0742 03/01/19 0836   03/01/19 0800  vancomycin (VANCOCIN) IVPB 1000 mg/200 mL premix  Status:  Discontinued     1,000 mg 200 mL/hr over 60 Minutes Intravenous Every 36 hours 03/01/19 0735 03/01/19 0836   02/28/19 2200  ceFEPIme (MAXIPIME) 1 g in sodium chloride 0.9 % 100 mL IVPB  Status:  Discontinued    Note to Pharmacy:  Pharmacy to dose   1 g 200 mL/hr over 30 Minutes Intravenous Every 12 hours 02/28/19 1321 03/01/19 0742   02/28/19 1352  vancomycin variable dose per unstable renal function (pharmacist dosing)  Status:  Discontinued      Does not apply See admin instructions 02/28/19 1354 03/01/19 0735   02/28/19 1330  vancomycin (VANCOCIN) IVPB 1000 mg/200 mL premix  Status:  Discontinued    Note to Pharmacy:  Pharmacy to dose   1,000 mg 200 mL/hr over 60 Minutes Intravenous  Once 02/28/19 1321 02/28/19 1354   02/28/19 0900  ceFEPIme (MAXIPIME) 2 g in sodium chloride 0.9 % 100 mL IVPB     2 g 200 mL/hr over 30 Minutes Intravenous  Once 02/28/19 0856 02/28/19 1100   02/28/19 0900  vancomycin (VANCOCIN) IVPB 1000 mg/200 mL premix     1,000 mg 200 mL/hr over 60 Minutes Intravenous  Once 02/28/19 0856  02/28/19 1143      Assessment/Plan: s/p Procedure(s): Lower Extremity Angiography (Left) 5/13 with aspiration thrombectomy, pta, 2 stents placed, but residual sluggish flow with poor pedal perfusion. This will not be sufficient.  Needs L CFA endarterectomy to facilitate wound healing for subsequent either BKA or AKA, TBD, depending on result from endarterectomy planned for 03/09/2019. This issue was discussed at length with him today, and he understands his situation.  Cultures NGTD for 3 days so far. Will make npo p MN in preparation for tomorrow.   LOS: 8 days   Richard [name] A Kena Limon 03/08/2019, 8:05 AM

## 2019-03-08 NOTE — Progress Notes (Signed)
Patient has oral temp 100.5, notified Dr. Jannifer Franklin on call who restarted rocephin. 650mg  tylenol administered, will continue to monitor.

## 2019-03-08 NOTE — Progress Notes (Signed)
Patient ID: Richard [name], male   DOB: 1956-09-02, 63 y.o.   MRN: [id number]   [facility] PROGRESS NOTE  Richard [name]:[id number] DOB: 08/20/1956 DOA: 02/28/2019 PCP: [facility] [place]  HPI/Subjective: Patient has left foot pain.  Objective: Vitals:   03/07/19 2018 03/08/19 0343  BP: 115/69 124/80  Pulse: 88 85  Resp: 18 16  Temp: 98.8 F (37.1 C) 98.9 F (37.2 C)  SpO2: 100% 98%    Filed Weights   02/28/19 1443 03/01/19 0416 03/04/19 1248  Weight: 55 kg 57.3 kg 57.3 kg    ROS: Review of Systems  Constitutional: Negative for chills and fever.  Eyes: Negative for blurred vision.  Respiratory: Negative for cough and shortness of breath.   Cardiovascular: Negative for chest pain.  Gastrointestinal: Negative for abdominal pain, constipation, diarrhea, nausea and vomiting.  Genitourinary: Negative for dysuria.  Musculoskeletal: Positive for joint pain.  Neurological: Negative for dizziness and headaches.   Exam: Physical Exam  HENT:  Nose: No mucosal edema.  Mouth/Throat: No oropharyngeal exudate or posterior oropharyngeal edema.  Eyes: Pupils are equal, round, and reactive to light. Conjunctivae and lids are normal.  Neck: No JVD present. Carotid bruit is not present. No edema present. No thyroid mass and no thyromegaly present.  Cardiovascular: S1 normal and S2 normal. Exam reveals no gallop.  No murmur heard. Respiratory: No respiratory distress. He has no wheezes. He has no rhonchi. He has no rales.  GI: Soft. Bowel sounds are normal. There is no abdominal tenderness.  Musculoskeletal:     Right ankle: He exhibits no swelling.     Left ankle: He exhibits no swelling.     Comments: Gangrenous left first and second toe  Lymphadenopathy:    He has no cervical adenopathy.  Neurological: He is alert.  Moves all extremities on his own  Skin: Skin is warm. Nails show no clubbing.  Left first and second toe gangrenous.  Demarcation starting to go  up the left foot now.  Has some blistering on the left foot.  Prior amputation of the right trans-met. 2 stage II decubiti noninfected on the buttock sacral area.  Psychiatric: He has a normal mood and affect.      Data Reviewed: Basic Metabolic Panel: Recent Labs  Lab 03/02/19 0423 03/03/19 0521 03/04/19 0548 03/05/19 0442  NA 154* 154* 140 138  K 3.4* 3.7 4.4 3.8  CL 120* 118* 106 104  CO2 _0 GLUCOSE 167* 222* 384* 125*  BUN 46* 24* 17 14  CREATININE 0.86 0.74 0.74 0.69  CALCIUM 7.9* 8.0* 7.5* 7.3*   Liver Function Tests: Recent Labs  Lab 03/02/19 0423 03/04/19 0548  AST 45* 26  ALT 16 14  ALKPHOS 30* 35*  BILITOT 0.6 0.5  PROT 6.0* 5.6*  ALBUMIN 2.5* 2.3*   No results for input(s): LIPASE, AMYLASE in the last 168 hours. CBC: Recent Labs  Lab 03/03/19 0521 03/03/19 1823 03/04/19 0548 03/05/19 0442 03/07/19 0613 03/08/19 0422  WBC 17.7*  --  15.8* 10.7* 13.7* 15.8*  HGB 6.4* 7.3* 6.9* 7.7* 7.3* 7.4*  HCT 20.5* 22.9* 21.6* 23.3* 22.3* 23.1*  MCV 86.1  --  86.4 85.7 87.5 87.2  PLT 310  --  249 204 307 384   Cardiac Enzymes: No results for input(s): CKTOTAL, CKMB, CKMBINDEX, TROPONINI in the last 168 hours.  CBG: Recent Labs  Lab 03/07/19 1632 03/07/19 2019 03/08/19 0346 03/08/19 0743 03/08/19 1227  GLUCAP 303* 220* 260* 277*  369*    Recent Results (from the past 240 hour(s))  Blood Culture (routine x 2)     Status: Abnormal   Collection Time: 02/28/19  9:48 AM  Result Value Ref Range Status   Specimen Description   Final    BLOOD RIGHT ANTECUBITAL Performed at [facility], [address], [place], [place] [address]    Special Requests   Final    BOTTLES DRAWN AEROBIC AND ANAEROBIC Blood Culture results may not be optimal due to an excessive volume of blood received in culture bottles Performed at [facility], [address] [place], [place] [address]    Culture (A)  Final    PROTEUS VULGARIS CRITICAL  RESULT CALLED TO, READ BACK BY AND VERIFIED WITH:  PHARMD ASAJAH D [id number] FCP GROWTH RECOVERED FROM AEROBIC BOTTLE ONLY. Performed at [facility], [place] [address] [place], [place] [address]    Report Status 03/03/2019 FINAL  Final   Organism ID, Bacteria PROTEUS VULGARIS  Final      Susceptibility   Proteus vulgaris - MIC*    AMPICILLIN >=32 RESISTANT Resistant     CEFAZOLIN >=64 RESISTANT Resistant     CEFEPIME <=1 SENSITIVE Sensitive     CEFTAZIDIME <=1 SENSITIVE Sensitive     CEFTRIAXONE <=1 SENSITIVE Sensitive     CIPROFLOXACIN <=0.25 SENSITIVE Sensitive     GENTAMICIN <=1 SENSITIVE Sensitive     IMIPENEM 2 SENSITIVE Sensitive     TRIMETH/SULFA <=20 SENSITIVE Sensitive     AMPICILLIN/SULBACTAM 4 SENSITIVE Sensitive     PIP/TAZO <=4 SENSITIVE Sensitive     * PROTEUS VULGARIS  Blood Culture (routine x 2)     Status: None   Collection Time: 02/28/19  9:48 AM  Result Value Ref Range Status   Specimen Description BLOOD RIGHT ANTECUBITAL  Final   Special Requests   Final    BOTTLES DRAWN AEROBIC AND ANAEROBIC Blood Culture adequate volume   Culture   Final    NO GROWTH 5 DAYS Performed at [facility], [address] [place], [place] [address]    Report Status 03/05/2019 FINAL  Final  Urine culture     Status: None   Collection Time: 02/28/19  9:48 AM  Result Value Ref Range Status   Specimen Description   Final    URINE, RANDOM Performed at [facility], [address] [place], [place] [address]    Special Requests   Final    NONE Performed at [facility], [address] [place], [place] [address]    Culture   Final    NO GROWTH Performed at [facility], [place] [address], [place], [place] [address]    Report Status 03/01/2019 FINAL  Final  SARS Coronavirus 2 (CEPHEID- Performed in [place] [facility] lab), Hosp Order     Status: None   Collection Time: 02/28/19  9:48 AM  Result Value Ref Range Status   SARS  Coronavirus 2 NEGATIVE NEGATIVE Final    Comment: (NOTE) If result is NEGATIVE SARS-CoV-2 target nucleic acids are NOT DETECTED. The SARS-CoV-2 RNA is generally detectable in upper and lower  respiratory specimens during the acute phase of infection. The lowest  concentration of SARS-CoV-2 viral copies this assay can detect is 250  copies / mL. A negative result does not preclude SARS-CoV-2 infection  and should not be used as the sole basis for treatment or other  patient management decisions.  A negative result may  occur with  improper specimen collection / handling, submission of specimen other  than nasopharyngeal swab, presence of viral mutation(s) within the  areas targeted by this assay, and inadequate number of viral copies  (<250 copies / mL). A negative result must be combined with clinical  observations, patient history, and epidemiological information. If result is POSITIVE SARS-CoV-2 target nucleic acids are DETECTED. The SARS-CoV-2 RNA is generally detectable in upper and lower  respiratory specimens dur ing the acute phase of infection.  Positive  results are indicative of active infection with SARS-CoV-2.  Clinical  correlation with patient history and other diagnostic information is  necessary to determine patient infection status.  Positive results do  not rule out bacterial infection or co-infection with other viruses. If result is PRESUMPTIVE POSTIVE SARS-CoV-2 nucleic acids MAY BE PRESENT.   A presumptive positive result was obtained on the submitted specimen  and confirmed on repeat testing.  While 2019 novel coronavirus  (SARS-CoV-2) nucleic acids may be present in the submitted sample  additional confirmatory testing may be necessary for epidemiological  and / or clinical management purposes  to differentiate between  SARS-CoV-2 and other Sarbecovirus currently known to infect humans.  If clinically indicated additional testing with an alternate test   methodology [phone]) is advised. The SARS-CoV-2 RNA is generally  detectable in upper and lower respiratory sp ecimens during the acute  phase of infection. The expected result is Negative. Fact Sheet for Patients:  StrictlyIdeas.no Fact Sheet for Healthcare Providers: BankingDealers.co.za This test is not yet approved or cleared by the [place] FDA and has been authorized for detection and/or diagnosis of SARS-CoV-2 by FDA under an Emergency Use Authorization (EUA).  This EUA will remain in effect (meaning this test can be used) for the duration of the COVID-19 declaration under Section 564(b)(1) of the Act, 21 U.S.C. section 360bbb-3(b)(1), unless the authorization is terminated or revoked sooner. Performed at [facility], [address] [place], [place] [address]   MRSA PCR Screening     Status: None   Collection Time: 02/28/19  2:48 PM  Result Value Ref Range Status   MRSA by PCR NEGATIVE NEGATIVE Final    Comment:        The GeneXpert MRSA Assay (FDA approved for NASAL specimens only), is one component of a comprehensive MRSA colonization surveillance program. It is not intended to diagnose MRSA infection nor to guide or monitor treatment for MRSA infections. Performed at [facility], [address] [place], [place] [address]   CULTURE, BLOOD (ROUTINE X 2) w Reflex to ID Panel     Status: None (Preliminary result)   Collection Time: 03/05/19  9:56 AM  Result Value Ref Range Status   Specimen Description BLOOD LEFT HAND  Final   Special Requests   Final    BOTTLES DRAWN AEROBIC AND ANAEROBIC Blood Culture results may not be optimal due to an excessive volume of blood received in culture bottles   Culture   Final    NO GROWTH 3 DAYS Performed at [facility], [address], [place], [place] [address]    Report Status PENDING  Incomplete  CULTURE, BLOOD (ROUTINE X 2) w Reflex to ID  Panel     Status: None (Preliminary result)   Collection Time: 03/05/19  9:56 AM  Result Value Ref Range Status   Specimen Description BLOOD RIGHT FA  Final   Special Requests   Final    BOTTLES DRAWN AEROBIC AND ANAEROBIC Blood  Culture adequate volume   Culture   Final    NO GROWTH 3 DAYS Performed at [facility], [address] [place], [place] [address]    Report Status PENDING  Incomplete     Studies: No results found.  Scheduled Meds: . aspirin  81 mg Oral Daily  . enoxaparin (LOVENOX) injection  40 mg Subcutaneous Q24H  . feeding supplement (ENSURE ENLIVE)  237 mL Oral TID BM  . insulin aspart  0-5 Units Subcutaneous QHS  . insulin aspart  0-9 Units Subcutaneous TID WC  . insulin aspart  5 Units Subcutaneous TID WC  . insulin glargine  10 Units Subcutaneous Daily  . mouth rinse  15 mL Mouth Rinse BID  . rosuvastatin  40 mg Oral q1800  . sodium chloride flush  10 mL Intravenous Q12H   Continuous Infusions: . sodium chloride 10 mL/hr at 03/07/19 1707    Assessment/Plan:  1.  Sepsis with Proteus vulgaris.  Likely the gangrenous toes as the source.  Demarcation starting to go up to the left foot.  Patient with low-grade temperature.  Repeat blood cultures so far negative.  Patient on Rocephin.  Patient has poor circulation and would need a revascularization procedure prior to amputation in order for it to heal.  Patient prefers surgery at this time. Follow up CBC.  2.  Severe peripheral vascular disease.  Angiogram showing poor blood supply to the lower extremities. L CFA endarterectomy planned for tomorrow.  3.  Acute hypoxic respiratory failure.  Patient tapered off oxygen 4.  Acute myocardial infarction.  Conservative management as per cardiology.  Patient on aspirin.  Hold off on Brilinta because the patient may need a amputation.  High-dose Crestor started. 5.  Hyperglycemia due to type 2 diabetes mellitus.   Increased Lantus to 10 units with sliding  scale. Added novolog 5 unit AC. 6.  Acute kidney injury normalized with IV fluids. 7.  Cocaine abuse 8.  Chronic atrial fibrillation.  We will have to discuss anticoagulation after procedure. 9.  History of pancreatic cancer status post Whipple procedure in 2018 10.  Hypernatremia.  This has normalized. 11.  Anemia of chronic disease.  Patient has received 2 units of packed red blood cells. Hb down to 7.3. no active bleeding. Hb 7.4.  12.  2 stage II decubitus on buttocks.  Local wound care  Code Status:     Code Status Orders  (From admission, onward)         Start     Ordered   03/02/19 1454  Full code  Continuous     03/02/19 1454        Code Status History    Date Active Date Inactive Code Status Order ID Comments User Context   02/28/2019 1449 03/01/2019 0836 Full Code [id number]  Gorden Harms, MD Inpatient   02/28/2019 1449 02/28/2019 1449 Full Code [id number]  Gorden Harms, MD Inpatient   11/11/2018 0758 11/18/2018 1814 DNR [id number]  Loletha Grayer, MD ED   09/20/2018 1635 09/24/2018 1854 Full Code [id number]  Fritzi Mandes, MD ED   08/03/2018 1020 08/04/2018 1813 Full Code [id number]  Salary, Avel Peace, MD ED   01/09/2018 1025 01/11/2018 1932 Full Code [id number]  Harrie Foreman, MD Inpatient   09/05/2017 1559 09/11/2017 1928 Full Code [id number]  Fritzi Mandes, MD Inpatient   08/24/2017 0238 08/29/2017 2131 Full Code [id number]  Lance Coon, MD Inpatient   08/10/2015 0822 08/11/2015 1947 Full Code [id number]  Harrie Foreman,  MD Inpatient     Disposition Plan: TBD  Consultants:  Critical care specialist  Cardiology  Palliative care  Vascular surgery  Podiatry  Antibiotics:  Rocephin  Time spent: 25 minutes.  Spoke with patient's significant other Richard [name] on the phone. Richard [name]  [employer]

## 2019-03-09 ENCOUNTER — Inpatient Hospital Stay: Payer: Medicare Other

## 2019-03-09 ENCOUNTER — Other Ambulatory Visit: Payer: Self-pay

## 2019-03-09 ENCOUNTER — Encounter: Payer: Self-pay | Admitting: Anesthesiology

## 2019-03-09 ENCOUNTER — Encounter
Admission: EM | Disposition: A | Discharge status: Skilled Nursing Facility | Payer: Self-pay | Source: Home / Self Care | Attending: Internal Medicine

## 2019-03-09 LAB — URINALYSIS, COMPLETE (UACMP) WITH MICROSCOPIC
Bacteria, UA: NONE SEEN
Bilirubin Urine: NEGATIVE
Glucose, UA: >=500 mg/dL — AB
Hgb urine dipstick: NEGATIVE
Ketones, ur: 5 mg/dL — AB
Leukocytes,Ua: NEGATIVE
Nitrite: NEGATIVE
Protein, ur: NEGATIVE mg/dL
Specific Gravity, Urine: 1.014 (ref 1.005–1.030)
pH: 7 (ref 5.0–8.0)

## 2019-03-09 LAB — BASIC METABOLIC PANEL
Anion gap: 7 (ref 5–15)
BUN: 10 mg/dL (ref 8–23)
CO2: 29 mmol/L (ref 22–32)
Calcium: 7.8 mg/dL — ABNORMAL LOW (ref 8.9–10.3)
Chloride: 101 mmol/L (ref 98–111)
Creatinine, Ser: 0.57 mg/dL — ABNORMAL LOW (ref 0.61–1.24)
GFR calc Af Amer: >60 mL/min (ref >60)
GFR calc non Af Amer: >60 mL/min (ref >60)
Glucose, Bld: 220 mg/dL — ABNORMAL HIGH (ref 70–99)
Potassium: 4.4 mmol/L (ref 3.5–5.1)
Sodium: 137 mmol/L (ref 135–145)

## 2019-03-09 LAB — CBC WITH DIFFERENTIAL/PLATELET
Abs Immature Granulocytes: 0.12 10*3/uL — ABNORMAL HIGH (ref 0.00–0.07)
Basophils Absolute: 0 10*3/uL (ref 0.0–0.1)
Basophils Relative: 0 %
Eosinophils Absolute: 0.3 10*3/uL (ref 0.0–0.5)
Eosinophils Relative: 1 %
HCT: 22.3 % — ABNORMAL LOW (ref 39.0–52.0)
Hemoglobin: 7.1 g/dL — ABNORMAL LOW (ref 13.0–17.0)
Immature Granulocytes: 1 %
Lymphocytes Relative: 14 %
Lymphs Abs: 2.5 10*3/uL (ref 0.7–4.0)
MCH: 27.7 pg (ref 26.0–34.0)
MCHC: 31.8 g/dL (ref 30.0–36.0)
MCV: 87.1 fL (ref 80.0–100.0)
Monocytes Absolute: 2 10*3/uL — ABNORMAL HIGH (ref 0.1–1.0)
Monocytes Relative: 11 %
Neutro Abs: 12.9 10*3/uL — ABNORMAL HIGH (ref 1.7–7.7)
Neutrophils Relative %: 73 %
Platelets: 469 10*3/uL — ABNORMAL HIGH (ref 150–400)
RBC: 2.56 MIL/uL — ABNORMAL LOW (ref 4.22–5.81)
RDW: 15.4 % (ref 11.5–15.5)
WBC: 17.8 10*3/uL — ABNORMAL HIGH (ref 4.0–10.5)
nRBC: 0.1 % (ref 0.0–0.2)

## 2019-03-09 LAB — GLUCOSE, CAPILLARY
Glucose-Capillary: 262 mg/dL — ABNORMAL HIGH (ref 70–99)
Glucose-Capillary: 192 mg/dL — ABNORMAL HIGH (ref 70–99)
Glucose-Capillary: 303 mg/dL — ABNORMAL HIGH (ref 70–99)
Glucose-Capillary: 266 mg/dL — ABNORMAL HIGH (ref 70–99)

## 2019-03-09 LAB — PROTIME-INR
INR: 1.2 (ref 0.8–1.2)
Prothrombin Time: 14.9 seconds (ref 11.4–15.2)

## 2019-03-09 LAB — APTT: aPTT: 51 seconds — ABNORMAL HIGH (ref 24–36)

## 2019-03-09 SURGERY — ENDARTERECTOMY, FEMORAL
Anesthesia: General | Laterality: Left

## 2019-03-09 MED ORDER — INSULIN ASPART 100 UNIT/ML ~~LOC~~ SOLN
8.0000 [IU] | Freq: Three times a day (TID) | SUBCUTANEOUS | Status: DC
  Administered 2019-03-09 – 2019-03-10 (×3): 8 [IU] via SUBCUTANEOUS
  Filled 2019-03-09 (×3): qty 1

## 2019-03-09 MED ORDER — SODIUM CHLORIDE 0.9 % IV SOLN: INTRAVENOUS | Status: DC

## 2019-03-09 MED ORDER — INSULIN GLARGINE 100 UNIT/ML ~~LOC~~ SOLN
13.0000 [IU] | Freq: Every day | SUBCUTANEOUS | Status: DC
  Administered 2019-03-10: 10:00:00 13 [IU] via SUBCUTANEOUS
  Filled 2019-03-09 (×2): qty 0.13

## 2019-03-09 MED ORDER — ONDANSETRON HCL 4 MG/2ML IJ SOLN
4.0000 mg | Freq: Four times a day (QID) | INTRAMUSCULAR | Status: DC | PRN
  Administered 2019-03-12: 4 mg via INTRAVENOUS

## 2019-03-09 MED ORDER — FUROSEMIDE 10 MG/ML IJ SOLN
20.0000 mg | Freq: Once | INTRAMUSCULAR | Status: DC
  Filled 2019-03-09: qty 2

## 2019-03-09 MED ORDER — ACETAMINOPHEN 325 MG PO TABS
650.0000 mg | ORAL_TABLET | Freq: Once | ORAL | Status: AC
  Administered 2019-03-09: 650 mg via ORAL
  Filled 2019-03-09: qty 2

## 2019-03-09 MED ORDER — HYDROMORPHONE HCL 1 MG/ML IJ SOLN: 1.0000 mg | Freq: Once | INTRAMUSCULAR | Status: DC | PRN

## 2019-03-09 MED ORDER — DIPHENHYDRAMINE HCL 50 MG/ML IJ SOLN
25.0000 mg | Freq: Once | INTRAMUSCULAR | Status: DC
  Filled 2019-03-09: qty 1

## 2019-03-09 MED ORDER — SODIUM CHLORIDE 0.9% IV SOLUTION: Freq: Once | INTRAVENOUS | Status: DC

## 2019-03-09 SURGICAL SUPPLY — 64 items
APPLIER CLIP 11 MED OPEN (CLIP)
APPLIER CLIP 9.375 SM OPEN (CLIP)
BAG COUNTER SPONGE EZ (MISCELLANEOUS) ×2 IMPLANT
BAG DECANTER FOR FLEXI CONT (MISCELLANEOUS) ×3 IMPLANT
BAG ISOLATATION DRAPE 20X20 ST (DRAPES) IMPLANT
BLADE SURG 15 STRL LF DISP TIS (BLADE) ×1 IMPLANT
BLADE SURG 15 STRL SS (BLADE) ×2
BLADE SURG SZ11 CARB STEEL (BLADE) ×3 IMPLANT
BOOT SUTURE AID YELLOW STND (SUTURE) ×3 IMPLANT
BRUSH SCRUB EZ  4% CHG (MISCELLANEOUS) ×2
BRUSH SCRUB EZ 4% CHG (MISCELLANEOUS) ×1 IMPLANT
CANISTER SUCT 1200ML W/VALVE (MISCELLANEOUS) ×3 IMPLANT
CHLORAPREP W/TINT 26 (MISCELLANEOUS) ×3 IMPLANT
CLIP APPLIE 11 MED OPEN (CLIP) IMPLANT
CLIP APPLIE 9.375 SM OPEN (CLIP) IMPLANT
COUNTER SPONGE BAG EZ (MISCELLANEOUS) ×1
COVER WAND RF STERILE (DRAPES) ×3 IMPLANT
DERMABOND ADVANCED (GAUZE/BANDAGES/DRESSINGS) ×2
DERMABOND ADVANCED .7 DNX12 (GAUZE/BANDAGES/DRESSINGS) ×1 IMPLANT
DRAPE INCISE IOBAN 66X45 STRL (DRAPES) ×3 IMPLANT
DRAPE ISOLATE BAG 20X20 STRL (DRAPES)
ELECT CAUTERY BLADE 6.4 (BLADE) ×3 IMPLANT
ELECT REM PT RETURN 9FT ADLT (ELECTROSURGICAL) ×3
ELECTRODE REM PT RTRN 9FT ADLT (ELECTROSURGICAL) ×1 IMPLANT
GLOVE BIO SURGEON STRL SZ7 (GLOVE) ×6 IMPLANT
GLOVE INDICATOR 7.5 STRL GRN (GLOVE) ×3 IMPLANT
GOWN STRL REUS W/ TWL LRG LVL3 (GOWN DISPOSABLE) ×1 IMPLANT
GOWN STRL REUS W/ TWL XL LVL3 (GOWN DISPOSABLE) ×2 IMPLANT
GOWN STRL REUS W/TWL LRG LVL3 (GOWN DISPOSABLE) ×2
GOWN STRL REUS W/TWL XL LVL3 (GOWN DISPOSABLE) ×4
HEMOSTAT SURGICEL 2X3 (HEMOSTASIS) ×3 IMPLANT
IV NS 500ML (IV SOLUTION) ×2
IV NS 500ML BAXH (IV SOLUTION) ×1 IMPLANT
KIT TURNOVER KIT A (KITS) ×3 IMPLANT
LABEL OR SOLS (LABEL) ×3 IMPLANT
LOOP RED MAXI  1X406MM (MISCELLANEOUS) ×4
LOOP VESSEL MAXI 1X406 RED (MISCELLANEOUS) ×2 IMPLANT
LOOP VESSEL MINI 0.8X406 BLUE (MISCELLANEOUS) ×2 IMPLANT
LOOPS BLUE MINI 0.8X406MM (MISCELLANEOUS) ×4
NDL SAFETY ECLIPSE 18X1.5 (NEEDLE) ×1 IMPLANT
NEEDLE HYPO 18GX1.5 SHARP (NEEDLE) ×2
NS IRRIG 500ML POUR BTL (IV SOLUTION) ×3 IMPLANT
PACK BASIN MAJOR ARMC (MISCELLANEOUS) ×3 IMPLANT
PACK UNIVERSAL (MISCELLANEOUS) ×3 IMPLANT
SUT MNCRL 4-0 (SUTURE) ×2
SUT MNCRL 4-0 27XMFL (SUTURE) ×1
SUT PROLENE 5 0 RB 1 DA (SUTURE) ×6 IMPLANT
SUT PROLENE 6 0 BV (SUTURE) ×12 IMPLANT
SUT PROLENE 7 0 BV 1 (SUTURE) ×6 IMPLANT
SUT SILK 2 0 (SUTURE) ×2
SUT SILK 2-0 18XBRD TIE 12 (SUTURE) ×1 IMPLANT
SUT SILK 3 0 (SUTURE) ×2
SUT SILK 3-0 18XBRD TIE 12 (SUTURE) ×1 IMPLANT
SUT SILK 4 0 (SUTURE) ×2
SUT SILK 4-0 18XBRD TIE 12 (SUTURE) ×1 IMPLANT
SUT VIC AB 2-0 CT1 27 (SUTURE) ×4
SUT VIC AB 2-0 CT1 TAPERPNT 27 (SUTURE) ×2 IMPLANT
SUT VIC AB 3-0 SH 27 (SUTURE) ×2
SUT VIC AB 3-0 SH 27X BRD (SUTURE) ×1 IMPLANT
SUT VICRYL+ 3-0 36IN CT-1 (SUTURE) ×6 IMPLANT
SUTURE MNCRL 4-0 27XMF (SUTURE) ×1 IMPLANT
SYR 20CC LL (SYRINGE) ×3 IMPLANT
SYR 5ML LL (SYRINGE) ×3 IMPLANT
TRAY FOLEY MTR SLVR 16FR STAT (SET/KITS/TRAYS/PACK) ×3 IMPLANT

## 2019-03-09 NOTE — Progress Notes (Signed)
Inpatient Diabetes Program Recommendations  AACE/ADA: New Consensus Statement on Inpatient Glycemic Control   Target Ranges:  Prepandial:   less than 140 mg/dL      Peak postprandial:   less than 180 mg/dL (1-2 hours)      Critically ill patients:  140 - 180 mg/dL  Results for Richard [name], Richard [name] (MRN [id number]) as of 03/09/2019 09:04  Ref. Range 03/09/2019 04:22  Glucose Latest Ref Range: 70 - 99 mg/dL 220 (H)   Results for [name], [name] (MRN [id number]) as of 03/09/2019 09:04  Ref. Range 03/08/2019 07:43 03/08/2019 12:27 03/08/2019 17:13 03/08/2019 19:54  Glucose-Capillary Latest Ref Range: 70 - 99 mg/dL 277 (H) 369 (H) 269 (H) 205 (H)   Results for [name], [name] (MRN [id number]) as of 03/09/2019 09:04  Ref. Range 11/11/2018 12:48 02/28/2019 18:31  Hemoglobin A1C Latest Ref Range: 4.8 - 5.6 % 14.8 (H) >15.5 (H)   Review of Glycemic Control  Outpatient Diabetes medications: Levemir 17 units daily, Novolog 5 units TID with meals, Novolog 0-5 units QHS Current orders for Inpatient glycemic control: Lantus 10 units daily, Novolog 0-9 units TID with meals, Novolog 0-5 units QHS, Novolog 5 units TID with meals  Inpatient Diabetes Program Recommendations:   Insulin - Basal: Please consider increasing Lantus to 13 units daily.  Insulin - Meal Coverage: Please consider increasing meal coverage to Novolog 8 units TID with meals.  Diet: Was ordered Regular diet prior to being made NPO and has Ensure Enlive 237 ml TID ordered between meals.  When diet resumed, please order Carb Modified diet if appropriate.  HgbA1C: A1C >15.5% on 02/28/19 indicating an average glucose over 398 mg/dl over the past 2-3 months.   Thanks, Barnie Alderman, RN, MSN, CDE Diabetes Coordinator Inpatient Diabetes Program [phone] (Team Pager from 8am to 5pm)

## 2019-03-09 NOTE — Progress Notes (Signed)
[facility] Daily Progress Note   Subjective: 5 Days Post-Op: 1. Ultrasound guidance for vascular access right femoral artery 2. Catheter placement into left common femoral artery from right femoral approach 3. Aortogram and selective left lower extremity angiogram 4. Percutaneous transluminal angioplasty of left anterior tibial artery with 3 mm diameter and 4 mm diameter angioplasty balloons 5.  Percutaneous transluminal angioplasty of the left SFA and popliteal arteries with 5 mm diameter Lutonix drug-coated angioplasty balloon and 6 mm diameter high-pressure angioplasty balloons             6.  Mechanical thrombectomy using the penumbra cat 6 device to the left common femoral artery, SFA, popliteal artery, and anterior tibial arteries             7.  Viabahn stent placement to the proximal left SFA with a 6 mm diameter by 7.5 cm length covered stent             8.  Upon stent placement to the left anterior tibial artery with 5 mm diameter by 10 cm length covered stent 9. StarClose closure device right femoral artery  Patient without complaint. Sitting in bed eating.   Objective: Vitals:   03/09/19 0644 03/09/19 0645 03/09/19 1125 03/09/19 1340  BP:  122/72 138/77 (!) 98/48  Pulse: 92 94 100 91  Resp: 18  16 16   Temp:  99.7 F (37.6 C) (!) 102.9 F (39.4 C) 100.3 F (37.9 C)  TempSrc:   Oral Oral  SpO2: 100%  100% 98%  Weight:      Height:        Intake/Output Summary (Last 24 hours) at 03/09/2019 1420 Last data filed at 03/09/2019 4287 Gross per 24 hour  Intake 250 ml  Output 1925 ml  Net -1675 ml   Physical Exam: A&Ox3, NAD CV: RRR Pulmonary: CTA Bilaterally Abdomen: Soft, Nontender, Nondistended Right Groin: Access site - clean and dry Vascular:  Left Lower Extremity: Dry gangrene to toes one / two.   Motor / sensory intact. Toes 3-5  erythematous.  Laboratory: CBC    Component Value Date/Time   WBC 17.8 (H) 03/09/2019 0422   HGB 7.1 (L) 03/09/2019 0422   HCT 22.3 (L) 03/09/2019 0422   PLT 469 (H) 03/09/2019 0422   BMET    Component Value Date/Time   NA 137 03/09/2019 0422   K 4.4 03/09/2019 0422   CL 101 03/09/2019 0422   CO2 29 03/09/2019 0422   GLUCOSE 220 (H) 03/09/2019 0422   BUN 10 03/09/2019 0422   CREATININE 0.57 (L) 03/09/2019 0422   CALCIUM 7.8 (L) 03/09/2019 0422   GFRNONAA >60 03/09/2019 0422   GFRAA >60 03/09/2019 0422   Assessment/Planning: The patient is a 63 year old male with severe PAD of the LLE  1) Patient with anemia and fever this AM - fever workup initiated 2) Planned 2u PRBC transfusion for today on hold due to fever. Hopefully, can transfuse tomorrow as anesthesia is requesting transfusion to proceed with surgery. 3) Plan is for OR Wed  Discussed with Dr. Ellis Parents Stegmayer PA-C 03/09/2019 2:20 PM

## 2019-03-09 NOTE — Progress Notes (Signed)
   03/09/19 1400  Clinical Encounter Type  Visited With Patient  Visit Type Follow-up  Referral From Nurse  Consult/Referral To Chaplain   Chaplain received an OR to follow up with the patient from his nurse. Upon arrival, he was being visited by [employer]. The lights were off in the room and the patient appeared to be low energy, but still pleasant as evident by his smile and light-hearted demeanor. His presenting concern was that his surgery (amputatation) had been rescheduled for Wednesday. He acknowledged that he "didn't know how to feel." He expressed that this has been a tough road for him and his family and that the rescheduled surgery was "prolonging the inevitable". He stated that while he did not "look forward" to the amputation, he was looking forward to getting it done and being on the other side. The chaplain affirmed his mixed emotions and acknowledged that these two things can co-exist. Support provided in the form of compassionate presence and active and reflective listening. The patient said that his adult children, significant other, and others care very much for him and his health and well-being. In following up from the previous visit on 5/14, the chaplain reminded the patient that he said he "wanted to do better" and asked for some clarification. He affirmed this and said that was in reference to managing his diabetes and heart condition. Patient began to look tired and close his eyes, so chaplain follow up again tomorrow. This writer recommends a morning visit.

## 2019-03-09 NOTE — Plan of Care (Signed)
  Problem: Education: Goal: Ability to describe self-care measures that may prevent or decrease complications (Diabetes Survival Skills Education) will improve Outcome: Progressing Goal: Individualized Educational Video(s) Outcome: Progressing   Problem: Cardiac: Goal: Ability to maintain an adequate cardiac output will improve Outcome: Progressing   Problem: Health Behavior/Discharge Planning: Goal: Ability to identify and utilize available resources and services will improve Outcome: Progressing Goal: Ability to manage health-related needs will improve Outcome: Progressing   Problem: Fluid Volume: Goal: Ability to achieve a balanced intake and output will improve Outcome: Progressing   Problem: Metabolic: Goal: Ability to maintain appropriate glucose levels will improve Outcome: Progressing   Problem: Nutritional: Goal: Maintenance of adequate nutrition will improve Outcome: Progressing Goal: Maintenance of adequate weight for body size and type will improve Outcome: Progressing   Problem: Respiratory: Goal: Will regain and/or maintain adequate ventilation Outcome: Progressing   Problem: Urinary Elimination: Goal: Ability to achieve and maintain adequate renal perfusion and functioning will improve Outcome: Progressing   Problem: Education: Goal: Knowledge of General Education information will improve Description: Including pain rating scale, medication(s)/side effects and non-pharmacologic comfort measures Outcome: Progressing   Problem: Health Behavior/Discharge Planning: Goal: Ability to manage health-related needs will improve Outcome: Progressing   Problem: Clinical Measurements: Goal: Ability to maintain clinical measurements within normal limits will improve Outcome: Progressing Goal: Will remain free from infection Outcome: Progressing Goal: Diagnostic test results will improve Outcome: Progressing Goal: Respiratory complications will improve Outcome:  Progressing Goal: Cardiovascular complication will be avoided Outcome: Progressing   Problem: Activity: Goal: Risk for activity intolerance will decrease Outcome: Progressing   Problem: Nutrition: Goal: Adequate nutrition will be maintained Outcome: Progressing   Problem: Coping: Goal: Level of anxiety will decrease Outcome: Progressing   Problem: Elimination: Goal: Will not experience complications related to bowel motility Outcome: Progressing Goal: Will not experience complications related to urinary retention Outcome: Progressing   Problem: Pain Managment: Goal: General experience of comfort will improve Outcome: Progressing   Problem: Safety: Goal: Ability to remain free from injury will improve Outcome: Progressing   Problem: Skin Integrity: Goal: Risk for impaired skin integrity will decrease Outcome: Progressing   

## 2019-03-09 NOTE — Care Management Important Message (Signed)
Important Message  Patient Details  Name: Richard [name] MRN: [id number] Date of Birth: 1956-08-31   Medicare Important Message Given:  Yes    Su Hilt, RN 03/09/2019, 10:54 AM

## 2019-03-09 NOTE — Progress Notes (Addendum)
Patient ID: Richard [name], male   DOB: 12-15-1955, 63 y.o.   MRN: [id number]   [facility] PROGRESS NOTE  Vineeth Fell NWG:[id number] DOB: [birth year] DOA: 02/28/2019 PCP: [facility] [place]  HPI/Subjective: Patient has fever this morning.  He has no complaints.  Procedure is canceled due to fever. Objective: Vitals:   03/09/19 0645 03/09/19 1125  BP: 122/72 138/77  Pulse: 94 100  Resp:  16  Temp: 99.7 F (37.6 C) (!) 102.9 F (39.4 C)  SpO2:  100%    Filed Weights   02/28/19 1443 03/01/19 0416 03/04/19 1248  Weight: 55 kg 57.3 kg 57.3 kg    ROS: Review of Systems  Constitutional: Negative for chills and fever.  Eyes: Negative for blurred vision.  Respiratory: Negative for cough and shortness of breath.   Cardiovascular: Negative for chest pain.  Gastrointestinal: Negative for abdominal pain, constipation, diarrhea, nausea and vomiting.  Genitourinary: Negative for dysuria.  Musculoskeletal: Positive for joint pain.  Neurological: Negative for dizziness and headaches.   Exam: Physical Exam  HENT:  Nose: No mucosal edema.  Mouth/Throat: No oropharyngeal exudate or posterior oropharyngeal edema.  Eyes: Pupils are equal, round, and reactive to light. Conjunctivae and lids are normal.  Neck: No JVD present. Carotid bruit is not present. No edema present. No thyroid mass and no thyromegaly present.  Cardiovascular: S1 normal and S2 normal. Exam reveals no gallop.  No murmur heard. Respiratory: No respiratory distress. He has no wheezes. He has no rhonchi. He has no rales.  GI: Soft. Bowel sounds are normal. There is no abdominal tenderness.  Musculoskeletal:     Right ankle: He exhibits no swelling.     Left ankle: He exhibits no swelling.     Comments: Gangrenous left first and second toe  Lymphadenopathy:    He has no cervical adenopathy.  Neurological: He is alert.  Moves all extremities on his own  Skin: Skin is warm. Nails show no clubbing.   Left first and second toe gangrenous.  Demarcation starting to go up the left foot now.  Has some blistering on the left foot.  Prior amputation of the right trans-met. 2 stage II decubiti noninfected on the buttock sacral area.  Psychiatric: He has a normal mood and affect.      Data Reviewed: Basic Metabolic Panel: Recent Labs  Lab 03/03/19 0521 03/04/19 0548 03/05/19 0442 03/09/19 0422  NA 154* 140 138 137  K 3.7 4.4 3.8 4.4  CL 118* 106 104 101  CO2 _0 GLUCOSE 222* 384* 125* 220*  BUN 24* _1 CREATININE 0.74 0.74 0.69 0.57*  CALCIUM 8.0* 7.5* 7.3* 7.8*   Liver Function Tests: Recent Labs  Lab 03/04/19 0548  AST 26  ALT 14  ALKPHOS 35*  BILITOT 0.5  PROT 5.6*  ALBUMIN 2.3*   No results for input(s): LIPASE, AMYLASE in the last 168 hours. CBC: Recent Labs  Lab 03/04/19 0548 03/05/19 0442 03/07/19 0613 03/08/19 0422 03/09/19 0422  WBC 15.8* 10.7* 13.7* 15.8* 17.8*  NEUTROABS  --   --   --   --  12.9*  HGB 6.9* 7.7* 7.3* 7.4* 7.1*  HCT 21.6* 23.3* 22.3* 23.1* 22.3*  MCV 86.4 85.7 87.5 87.2 87.1  PLT 249 204 307 384 469*   Cardiac Enzymes: No results for input(s): CKTOTAL, CKMB, CKMBINDEX, TROPONINI in the last 168 hours.  CBG: Recent Labs  Lab 03/08/19 0743 03/08/19 1227 03/08/19 1713 03/08/19 1954 03/09/19 0913  GLUCAP 277* 369* 269* 205* 262*    Recent Results (from the past 240 hour(s))  Blood Culture (routine x 2)     Status: Abnormal   Collection Time: 02/28/19  9:48 AM  Result Value Ref Range Status   Specimen Description   Final    BLOOD RIGHT ANTECUBITAL Performed at [facility], [address] [place], [place] [address]    Special Requests   Final    BOTTLES DRAWN AEROBIC AND ANAEROBIC Blood Culture results may not be optimal due to an excessive volume of blood received in culture bottles Performed at [facility], [address] [place], [place] [address]    Culture (A)  Final    PROTEUS  VULGARIS CRITICAL RESULT CALLED TO, READ BACK BY AND VERIFIED WITH:  PHARMD ASAJAH D [id number] FCP GROWTH RECOVERED FROM AEROBIC BOTTLE ONLY. Performed at [facility], [place] [address] [place], [place] [address]    Report Status 03/03/2019 FINAL  Final   Organism ID, Bacteria PROTEUS VULGARIS  Final      Susceptibility   Proteus vulgaris - MIC*    AMPICILLIN >=32 RESISTANT Resistant     CEFAZOLIN >=64 RESISTANT Resistant     CEFEPIME <=1 SENSITIVE Sensitive     CEFTAZIDIME <=1 SENSITIVE Sensitive     CEFTRIAXONE <=1 SENSITIVE Sensitive     CIPROFLOXACIN <=0.25 SENSITIVE Sensitive     GENTAMICIN <=1 SENSITIVE Sensitive     IMIPENEM 2 SENSITIVE Sensitive     TRIMETH/SULFA <=20 SENSITIVE Sensitive     AMPICILLIN/SULBACTAM 4 SENSITIVE Sensitive     PIP/TAZO <=4 SENSITIVE Sensitive     * PROTEUS VULGARIS  Blood Culture (routine x 2)     Status: None   Collection Time: 02/28/19  9:48 AM  Result Value Ref Range Status   Specimen Description BLOOD RIGHT ANTECUBITAL  Final   Special Requests   Final    BOTTLES DRAWN AEROBIC AND ANAEROBIC Blood Culture adequate volume   Culture   Final    NO GROWTH 5 DAYS Performed at [facility], [address] [place] [address], [place], [place] [address]    Report Status 03/05/2019 FINAL  Final  Urine culture     Status: None   Collection Time: 02/28/19  9:48 AM  Result Value Ref Range Status   Specimen Description   Final    URINE, RANDOM Performed at [facility], [address], [place], [place] [address]    Special Requests   Final    NONE Performed at [facility], [address], [place], [place] [address]    Culture   Final    NO GROWTH Performed at [facility], [place] [address] [place], [place] [address]    Report Status 03/01/2019 FINAL  Final  SARS Coronavirus 2 (CEPHEID- Performed in [place] hospital lab), Hosp Order     Status: None   Collection Time: 02/28/19  9:48 AM  Result Value Ref Range  Status   SARS Coronavirus 2 NEGATIVE NEGATIVE Final    Comment: (NOTE) If result is NEGATIVE SARS-CoV-2 target nucleic acids are NOT DETECTED. The SARS-CoV-2 RNA is generally detectable in upper and lower  respiratory specimens during the acute phase of infection. The lowest  concentration of SARS-CoV-2 viral copies this assay can detect is 250  copies / mL. A negative result does not preclude SARS-CoV-2 infection  and should not be used as the sole basis for treatment or other  patient management decisions.  A negative result may occur with  improper specimen collection / handling, submission of specimen other  than nasopharyngeal swab, presence of viral mutation(s) within the  areas targeted by this assay, and inadequate number of viral copies  (<250 copies / mL). A negative result must be combined with clinical  observations, patient history, and epidemiological information. If result is POSITIVE SARS-CoV-2 target nucleic acids are DETECTED. The SARS-CoV-2 RNA is generally detectable in upper and lower  respiratory specimens dur ing the acute phase of infection.  Positive  results are indicative of active infection with SARS-CoV-2.  Clinical  correlation with patient history and other diagnostic information is  necessary to determine patient infection status.  Positive results do  not rule out bacterial infection or co-infection with other viruses. If result is PRESUMPTIVE POSTIVE SARS-CoV-2 nucleic acids MAY BE PRESENT.   A presumptive positive result was obtained on the submitted specimen  and confirmed on repeat testing.  While 2019 novel coronavirus  (SARS-CoV-2) nucleic acids may be present in the submitted sample  additional confirmatory testing may be necessary for epidemiological  and / or clinical management purposes  to differentiate between  SARS-CoV-2 and other Sarbecovirus currently known to infect humans.  If clinically indicated additional testing with an alternate  test  methodology [phone]) is advised. The SARS-CoV-2 RNA is generally  detectable in upper and lower respiratory sp ecimens during the acute  phase of infection. The expected result is Negative. Fact Sheet for Patients:  StrictlyIdeas.no Fact Sheet for Healthcare Providers: BankingDealers.co.za This test is not yet approved or cleared by the [place] FDA and has been authorized for detection and/or diagnosis of SARS-CoV-2 by FDA under an Emergency Use Authorization (EUA).  This EUA will remain in effect (meaning this test can be used) for the duration of the COVID-19 declaration under Section 564(b)(1) of the Act, 21 U.S.C. section 360bbb-3(b)(1), unless the authorization is terminated or revoked sooner. Performed at [facility], [address] [place], [place] [address]   MRSA PCR Screening     Status: None   Collection Time: 02/28/19  2:48 PM  Result Value Ref Range Status   MRSA by PCR NEGATIVE NEGATIVE Final    Comment:        The GeneXpert MRSA Assay (FDA approved for NASAL specimens only), is one component of a comprehensive MRSA colonization surveillance program. It is not intended to diagnose MRSA infection nor to guide or monitor treatment for MRSA infections. Performed at [facility], [place] [place], [place] [address]   CULTURE, BLOOD (ROUTINE X 2) w Reflex to ID Panel     Status: None (Preliminary result)   Collection Time: 03/05/19  9:56 AM  Result Value Ref Range Status   Specimen Description BLOOD LEFT HAND  Final   Special Requests   Final    BOTTLES DRAWN AEROBIC AND ANAEROBIC Blood Culture results may not be optimal due to an excessive volume of blood received in culture bottles   Culture   Final    NO GROWTH 4 DAYS Performed at [facility], [address], [place],  [address]    Report Status PENDING  Incomplete  CULTURE, BLOOD (ROUTINE X 2) w Reflex  to ID Panel     Status: None (Preliminary result)   Collection Time: 03/05/19  9:56 AM  Result Value Ref Range Status   Specimen Description BLOOD RIGHT FA  Final   Special Requests   Final    BOTTLES DRAWN  AEROBIC AND ANAEROBIC Blood Culture adequate volume   Culture   Final    NO GROWTH 4 DAYS Performed at [facility], [address] [place], [place] [address]    Report Status PENDING  Incomplete     Studies: No results found.  Scheduled Meds: . sodium chloride   Intravenous Once  . aspirin  81 mg Oral Daily  . Chlorhexidine Gluconate Cloth  6 each Topical Once   And  . Chlorhexidine Gluconate Cloth  6 each Topical Once  . diphenhydrAMINE  25 mg Intravenous Once  . enoxaparin (LOVENOX) injection  40 mg Subcutaneous Q24H  . feeding supplement (ENSURE ENLIVE)  237 mL Oral TID BM  . furosemide  20 mg Intravenous Once  . insulin aspart  0-5 Units Subcutaneous QHS  . insulin aspart  0-9 Units Subcutaneous TID WC  . insulin aspart  5 Units Subcutaneous TID WC  . insulin glargine  10 Units Subcutaneous Daily  . mouth rinse  15 mL Mouth Rinse BID  . rosuvastatin  40 mg Oral q1800  . sodium chloride flush  10 mL Intravenous Q12H   Continuous Infusions: . sodium chloride    . cefTRIAXone (ROCEPHIN)  IV 2 g (03/08/19 2114)    Assessment/Plan:  1.  Sepsis with Proteus vulgaris.  Likely the gangrenous toes as the source.  Demarcation starting to go up to the left foot.  Patient with low-grade temperature.  Repeat blood cultures so far negative.  Patient on Rocephin.  Patient has poor circulation and would need a revascularization procedure prior to amputation in order for it to heal.  Patient prefers surgery at this time. Fever and worsening leukocytosis.  Continue antibiotics, repeat blood culture and follow up CBC.  2.  Severe peripheral vascular disease.  Angiogram showing poor blood supply to the lower extremities. L CFA endarterectomy planned today but  canceled.  3.  Acute hypoxic respiratory failure.  Patient tapered off oxygen 4.  Acute myocardial infarction.  Conservative management as per cardiology.  Patient on aspirin.  Hold off on Brilinta because the patient may need a amputation.  High-dose Crestor started. 5.  Hyperglycemia due to type 2 diabetes mellitus.   Increased Lantus to 10 units with sliding scale. Added novolog 5 unit AC. 6.  Acute kidney injury normalized with IV fluids. 7.  Cocaine abuse 8.  Chronic atrial fibrillation.  We will have to discuss anticoagulation after procedure. 9.  History of pancreatic cancer status post Whipple procedure in 2018 10.  Hypernatremia.  This has normalized. 11.  Anemia of chronic disease.  Patient has received 2 units of packed red blood cells. Hb down to 7.3. no active bleeding. Hb 7.1.  PRBC transfusion, follow-up hemoglobin. 12.  2 stage II decubitus on buttocks.  Local wound care.  Code Status:     Code Status Orders  (From admission, onward)         Start     Ordered   03/02/19 1454  Full code  Continuous     03/02/19 1454        Code Status History    Date Active Date Inactive Code Status Order ID Comments User Context   02/28/2019 1449 03/01/2019 0836 Full Code [id number]  Gorden Harms, MD Inpatient   02/28/2019 1449 02/28/2019 1449 Full Code [id number]  Gorden Harms, MD Inpatient   11/11/2018 0758 11/18/2018 1814 DNR [id number]  Loletha Grayer, MD ED   09/20/2018 1635 09/24/2018 1854 Full Code [id number]  Posey Pronto,  Gus Height, MD ED   08/03/2018 1020 08/04/2018 1813 Full Code [id number]  Gorden Harms, MD ED   01/09/2018 1025 01/11/2018 1932 Full Code [id number]  Harrie Foreman, MD Inpatient   09/05/2017 1559 09/11/2017 1928 Full Code [id number]  Fritzi Mandes, MD Inpatient   08/24/2017 0238 08/29/2017 2131 Full Code [id number]  Lance Coon, MD Inpatient   08/10/2015 0822 08/11/2015 1947 Full Code [id number]  Harrie Foreman, MD Inpatient     Disposition Plan:  TBD  Consultants:  Critical care specialist  Cardiology  Palliative care  Vascular surgery  Podiatry  Antibiotics:  Rocephin  Time spent: 33 minutes.  Spoke with patient's significant other Ms. Lewis on the phone. [name]  [employer]

## 2019-03-10 DIAGNOSIS — F17200 Nicotine dependence, unspecified, uncomplicated: Secondary | ICD-10-CM

## 2019-03-10 DIAGNOSIS — Z9889 Other specified postprocedural states: Secondary | ICD-10-CM

## 2019-03-10 DIAGNOSIS — B964 Proteus (mirabilis) (morganii) as the cause of diseases classified elsewhere: Secondary | ICD-10-CM

## 2019-03-10 DIAGNOSIS — F1410 Cocaine abuse, uncomplicated: Secondary | ICD-10-CM

## 2019-03-10 DIAGNOSIS — E1152 Type 2 diabetes mellitus with diabetic peripheral angiopathy with gangrene: Secondary | ICD-10-CM

## 2019-03-10 DIAGNOSIS — D72829 Elevated white blood cell count, unspecified: Secondary | ICD-10-CM

## 2019-03-10 DIAGNOSIS — D649 Anemia, unspecified: Secondary | ICD-10-CM

## 2019-03-10 DIAGNOSIS — Z89421 Acquired absence of other right toe(s): Secondary | ICD-10-CM

## 2019-03-10 DIAGNOSIS — R509 Fever, unspecified: Secondary | ICD-10-CM

## 2019-03-10 DIAGNOSIS — I2510 Atherosclerotic heart disease of native coronary artery without angina pectoris: Secondary | ICD-10-CM

## 2019-03-10 LAB — CULTURE, BLOOD (ROUTINE X 2)
Culture: NO GROWTH
Culture: NO GROWTH
Special Requests: ADEQUATE

## 2019-03-10 LAB — COMPREHENSIVE METABOLIC PANEL
ALT: 8 U/L (ref 0–44)
AST: 14 U/L — ABNORMAL LOW (ref 15–41)
Albumin: 2 g/dL — ABNORMAL LOW (ref 3.5–5.0)
Alkaline Phosphatase: 35 U/L — ABNORMAL LOW (ref 38–126)
Anion gap: 10 (ref 5–15)
BUN: 11 mg/dL (ref 8–23)
CO2: 26 mmol/L (ref 22–32)
Calcium: 7.9 mg/dL — ABNORMAL LOW (ref 8.9–10.3)
Chloride: 99 mmol/L (ref 98–111)
Creatinine, Ser: 0.64 mg/dL (ref 0.61–1.24)
GFR calc Af Amer: >60 mL/min (ref >60)
GFR calc non Af Amer: >60 mL/min (ref >60)
Glucose, Bld: 325 mg/dL — ABNORMAL HIGH (ref 70–99)
Potassium: 4.5 mmol/L (ref 3.5–5.1)
Sodium: 135 mmol/L (ref 135–145)
Total Bilirubin: 0.5 mg/dL (ref 0.3–1.2)
Total Protein: 6.4 g/dL — ABNORMAL LOW (ref 6.5–8.1)

## 2019-03-10 LAB — PREPARE RBC (CROSSMATCH)

## 2019-03-10 LAB — PHOSPHORUS: Phosphorus: 3.2 mg/dL (ref 2.5–4.6)

## 2019-03-10 LAB — CBC WITH DIFFERENTIAL/PLATELET
Abs Immature Granulocytes: 0.17 10*3/uL — ABNORMAL HIGH (ref 0.00–0.07)
Basophils Absolute: 0.1 10*3/uL (ref 0.0–0.1)
Basophils Relative: 0 %
Eosinophils Absolute: 0.1 10*3/uL (ref 0.0–0.5)
Eosinophils Relative: 1 %
HCT: 22.3 % — ABNORMAL LOW (ref 39.0–52.0)
Hemoglobin: 7.2 g/dL — ABNORMAL LOW (ref 13.0–17.0)
Immature Granulocytes: 1 %
Lymphocytes Relative: 10 %
Lymphs Abs: 2 10*3/uL (ref 0.7–4.0)
MCH: 28.5 pg (ref 26.0–34.0)
MCHC: 32.3 g/dL (ref 30.0–36.0)
MCV: 88.1 fL (ref 80.0–100.0)
Monocytes Absolute: 1.9 10*3/uL — ABNORMAL HIGH (ref 0.1–1.0)
Monocytes Relative: 9 %
Neutro Abs: 16 10*3/uL — ABNORMAL HIGH (ref 1.7–7.7)
Neutrophils Relative %: 79 %
Platelets: 550 10*3/uL — ABNORMAL HIGH (ref 150–400)
RBC: 2.53 MIL/uL — ABNORMAL LOW (ref 4.22–5.81)
RDW: 15.5 % (ref 11.5–15.5)
WBC: 20.2 10*3/uL — ABNORMAL HIGH (ref 4.0–10.5)
nRBC: 0.1 % (ref 0.0–0.2)

## 2019-03-10 LAB — GLUCOSE, CAPILLARY
Glucose-Capillary: 312 mg/dL — ABNORMAL HIGH (ref 70–99)
Glucose-Capillary: 298 mg/dL — ABNORMAL HIGH (ref 70–99)
Glucose-Capillary: 304 mg/dL — ABNORMAL HIGH (ref 70–99)
Glucose-Capillary: 141 mg/dL — ABNORMAL HIGH (ref 70–99)

## 2019-03-10 LAB — URINE CULTURE: Culture: NO GROWTH

## 2019-03-10 LAB — MAGNESIUM: Magnesium: 2.1 mg/dL (ref 1.7–2.4)

## 2019-03-10 LAB — HEMOGLOBIN AND HEMATOCRIT, BLOOD
HCT: 25.8 % — ABNORMAL LOW (ref 39.0–52.0)
Hemoglobin: 8.5 g/dL — ABNORMAL LOW (ref 13.0–17.0)

## 2019-03-10 MED ORDER — PIPERACILLIN-TAZOBACTAM 3.375 G IVPB 30 MIN: 3.3750 g | Freq: Three times a day (TID) | INTRAVENOUS | Status: DC

## 2019-03-10 MED ORDER — ACETAMINOPHEN 325 MG PO TABS
650.0000 mg | ORAL_TABLET | Freq: Once | ORAL | Status: AC
  Administered 2019-03-10: 650 mg via ORAL
  Filled 2019-03-10: qty 2

## 2019-03-10 MED ORDER — SODIUM CHLORIDE 0.9% IV SOLUTION
Freq: Once | INTRAVENOUS | Status: AC
  Administered 2019-03-10: 13:00:00 via INTRAVENOUS

## 2019-03-10 MED ORDER — ENSURE MAX PROTEIN PO LIQD
11.0000 [oz_av] | Freq: Two times a day (BID) | ORAL | Status: DC
  Administered 2019-03-10 – 2019-03-19 (×11): 11 [oz_av] via ORAL
  Filled 2019-03-10: qty 330

## 2019-03-10 MED ORDER — PIPERACILLIN-TAZOBACTAM 3.375 G IVPB
3.3750 g | Freq: Three times a day (TID) | INTRAVENOUS | Status: DC
  Administered 2019-03-10 – 2019-03-17 (×20): 3.375 g via INTRAVENOUS
  Filled 2019-03-10 (×20): qty 50

## 2019-03-10 MED ORDER — INSULIN ASPART 100 UNIT/ML ~~LOC~~ SOLN
10.0000 [IU] | Freq: Three times a day (TID) | SUBCUTANEOUS | Status: DC
  Administered 2019-03-10 – 2019-03-19 (×13): 10 [IU] via SUBCUTANEOUS
  Filled 2019-03-10 (×11): qty 1

## 2019-03-10 MED ORDER — INSULIN GLARGINE 100 UNIT/ML ~~LOC~~ SOLN
16.0000 [IU] | Freq: Every day | SUBCUTANEOUS | Status: DC
  Administered 2019-03-11: 09:00:00 16 [IU] via SUBCUTANEOUS
  Filled 2019-03-10 (×2): qty 0.16

## 2019-03-10 NOTE — Progress Notes (Signed)
Inpatient Diabetes Program Recommendations  AACE/ADA: New Consensus Statement on Inpatient Glycemic Control   Target Ranges:  Prepandial:   less than 140 mg/dL      Peak postprandial:   less than 180 mg/dL (1-2 hours)      Critically ill patients:  140 - 180 mg/dL   Results for DAN, SCEARCE (MRN [id number]) as of 03/10/2019 08:18  Ref. Range 03/09/2019 09:13 03/09/2019 12:08 03/09/2019 16:34 03/09/2019 22:06 03/10/2019 04:10 03/10/2019 07:43  Glucose-Capillary Latest Ref Range: 70 - 99 mg/dL 262 (H) 192 (H) 303 (H) 266 (H) 312 (H) 298 (H)   Review of Glycemic Control  Outpatient Diabetes medications:Levemir 17 units daily, Novolog 5 units TID with meals, Novolog 0-5 units QHS Current orders for Inpatient glycemic control:Lantus 10 units daily, Novolog 0-9 units TID with meals, Novolog 0-5 units QHS, Novolog 8 units TID with meals  Inpatient Diabetes Program Recommendations:   Insulin - Basal: In reviewing chart, noted Lantus was NOT GIVEN on 03/09/19 (not charted against). As a result glucose up to 303 mg/dl on 03/09/19 and fasting glucose 298 mg/dl this morning. Recommend to continue Lantus 13 units daily as ordered.  HgbA1C: A1C >15.5% on 02/28/19 indicating an average glucose over 398 mg/dl over the past 2-3 months.   Thanks, Barnie Alderman, RN, MSN, CDE Diabetes Coordinator Inpatient Diabetes Program [phone] (Team Pager from 8am to 5pm)

## 2019-03-10 NOTE — Progress Notes (Signed)
[facility] Daily Progress Note   Subjective: 6 Days Post-Op: 1.Ultrasound guidance for vascular accessrightfemoral artery 2.Catheter placement into left common femoral artery from right femoral approach 3.Aortogram and selectiveleftlower extremity angiogram 4.Percutaneous transluminal angioplasty ofleft anterior tibial artery with 3 mm diameter and 4 mm diameter angioplasty balloons 5.Percutaneous transluminal angioplasty of the left SFA and popliteal arteries with 5 mm diameter Lutonix drug-coated angioplasty balloon and 6 mm diameter high-pressure angioplasty balloons 6.Mechanical thrombectomy using the penumbra cat 6 device to the left common femoral artery, SFA, popliteal artery, and anterior tibial arteries 7.Viabahn stent placement to the proximal left SFA with a 6 mm diameter by 7.5 cm length covered stent 8.Upon stent placement to the left anterior tibial artery with 5 mm diameter by 10 cm length covered stent 9.StarClose closure device rightfemoral artery  Patient afebrile last 24 hours. Continued "throbbing" to left foot.   Objective: Vitals:   03/09/19 1340 03/09/19 2204 03/10/19 0350 03/10/19 0851  BP: (!) 98/48 125/66 (!) 144/84 129/69  Pulse: 91 92 94   Resp: 16 17 16 16   Temp: 100.3 F (37.9 C) 98.7 F (37.1 C) 99.1 F (37.3 C) 99.5 F (37.5 C)  TempSrc: Oral Oral Oral Oral  SpO2: 98% 97% 98% 94%  Weight:      Height:        Intake/Output Summary (Last 24 hours) at 03/10/2019 1121 Last data filed at 03/10/2019 1021 Gross per 24 hour  Intake 680.65 ml  Output 1400 ml  Net -719.35 ml   Physical Exam: A&Ox3, NAD Neck: Central line intact. No signs of infection / swelling noted CV: RRR Pulmonary: CTA Bilaterally Abdomen: Soft, Nontender, Nondistended, (+) bowel sounds Right Groin: Access site clean and dry  Vascular:  Left Lower Extremity: Thigh soft. Calf soft. First / second toe with dry gangrene. Toes 3-5 with skin intact. Picture taken this AM.       Laboratory: CBC    Component Value Date/Time   WBC 20.2 (H) 03/10/2019 0543   HGB 7.2 (L) 03/10/2019 0543   HCT 22.3 (L) 03/10/2019 0543   PLT 550 (H) 03/10/2019 0543   BMET    Component Value Date/Time   NA 135 03/10/2019 0543   K 4.5 03/10/2019 0543   CL 99 03/10/2019 0543   CO2 26 03/10/2019 0543   GLUCOSE 325 (H) 03/10/2019 0543   BUN 11 03/10/2019 0543   CREATININE 0.64 03/10/2019 0543   CALCIUM 7.9 (L) 03/10/2019 0543   GFRNONAA >60 03/10/2019 0543   GFRAA >60 03/10/2019 0543   Assessment/Planning: The patient is a 63 year old male with severe PAD of the LLE  1) Anemia: Will transfuse 1uPRBC as per anesthesia request in preparation for surgery.  2) OR either Wed or Thursday depending on OR availability. 3) Cultures drawn yesterday after fever negative so far. 4) CXR unremarkable.  Discussed with Dr. Ellis Parents Harbert Fitterer PA-C 03/10/2019 11:21 AM

## 2019-03-10 NOTE — Progress Notes (Signed)
Nutrition Follow-up  RD working remotely.  DOCUMENTATION CODES:   Not applicable  INTERVENTION:  Now that PO intake is improved, will switch Ensure Enlive to Ensure Max Protein.  Provide Ensure Max Protein po BID, each supplement provides 150 kcal and 30 grams of protein.  Discontinued Magic Cup with trays.  NUTRITION DIAGNOSIS:   Inadequate oral intake related to inability to eat as evidenced by NPO status.  Resolving - diet now advanced and PO intake has improved.  GOAL:   Patient will meet greater than or equal to 90% of their needs  Progressing.  MONITOR:   PO intake, Supplement acceptance, Labs, Weight trends, Skin, I & O's  REASON FOR ASSESSMENT:   Ventilator, Consult Enteral/tube feeding initiation and management  ASSESSMENT:   63 year old male with PMHx of HTN, DM, CAD, neuropathy, chronic back pain, chronic leg pain, CHF, A-fib, hx cocaine abuse, hx pancreatic cancer s/p Whipple procedure who is admitted with AMS, hyperthermia, DKA, respiratory failure requiring intubation on 5/9, also with gangrenous left great toe.  Unable to reach patient over the phone. Per chart PO intake has improved. He is now taking 100% of almost every meal. He did eat 50% of his lunch yesterday. He is drinking 1-2 bottles of Ensure per day. Plan is for left femoral endarterectomy once he can receive pRBCs.  Medications reviewed and include: Novolog 0-9 units TID, Novolog 0-5 units QHS, Novolog 8 units TID, Lantus 13 units daily, Crestor, ceftriaxone.  Labs reviewed: CBG 266-312.  Diet Order:   Diet Order            Diet heart healthy/carb modified Room service appropriate? Yes; Fluid consistency: Thin  Diet effective now             EDUCATION NEEDS:   No education needs have been identified at this time  Skin:  Skin Assessment: Skin Integrity Issues:(gangrenous left great toe; stg II left buttocks; stg II coccyx)  Last BM:  03/08/2019 - medium type 3  Height:   Ht  Readings from Last 1 Encounters:  03/04/19 5\' 5"  (1.651 m)   Weight:   Wt Readings from Last 1 Encounters:  03/04/19 57.3 kg   Ideal Body Weight:  61.8 kg  BMI:  Body mass index is 21.02 kg/m.  Estimated Nutritional Needs:   Kcal:  1680-1940 (MSJ x 1.3-1.5)  Protein:  85-105 grams (1.5-1.8 grams/kg)  Fluid:  1.8 L/day (30 mL/kg)  Willey Blade, MS, RD, LDN Office: [phone] Pager: [phone] After Hours/Weekend Pager: [phone]

## 2019-03-10 NOTE — Consult Note (Signed)
NAMELorenso [name]  DOB: 11/21/1955  MRN: [id number]  Date/Time: 03/10/2019 3:29 PM  REQUESTING PROVIDER: Bridgett Larsson Subjective:  REASON FOR CONSULT: Sepsis, foot gangrene and worsening leukocytosis ? Richard [name] is a 63 y.o. with a history of IDDM, CAD, right TMA, adenocarcinoma of the pancreas Presented to the ED through EMS on 02/28/2019, they found him unresponsive lying on the bed in his bedroom.  He has had a wound on his left great toe for a few days but was afraid of going to the hospital or seeking help because of COVID-19.  He had also stopped drinking and eating for the past 2 days before his presentation.  The EMS when they checked his vitals blood pressure was 68/40, respiratory rate of 16, the blood glucose was high, the Glasgow Coma Scale was 7.  He started him on IV fluids and also put him on a nonrebreather mask.  When he arrived in the ED he was intubated.  He was found to be in DKA and also his urine was positive for cocaine he was found to be severely hypothermic.  His WBC was 22,000 his pH was 7 and glucose of 923 and creatinine of 2.7.  CT head was negative.  SARS-CoV-2 test was negative.  He was admitted to ICU and was treated for acute DKA, acute kidney injury and for hypothermia.  He was started on broad-spectrum antibiotics after blood cultures were sent.  He was extubated on 03/01/2019.  He was seen by podiatrist on 03/03/2019  for a gangrenous left great toe.as he had nonpalpable and non-dopplerable pulses he was assessed by vascular.  He was taken for an angiogram on 03/04/2019 and underwent angioplasty of the left anterior tibial artery with 3 mm diameter and 4 mm diameter angioplasty balloons, of the left SFA and popliteal arteries with 5 mm balloon, mechanical thrombectomy using the penumbra cath 6 device to the left common femoral artery, SFA, popliteal artery and anterior tibial arteries.  Also had a stent placement to the proximal left SFA and left anterior tibial artery. Blood  culture was positive for Proteus. .  Patient also had an acute myocardial infarction while in the hospital conservatively by cardiology with aspirin and high-dose.  Brilinta was on hold because of the possibility of needing an amputation.  I am asked to see the patient for fever, worsening leukocytosis worsening gangrene.  He is currently on ceftriaxone    ANTIMICROBIALS:  Vanc 05/09 >> 5/10 Cefepime 05/09 >>5/10   Meropenem 05/11 >> 05/12 Ceftriaxone 5/13  Past medical history Adenocarcinoma of the pancreas Osteomyelitis of the right foot 2018 Insulin-dependent diabetes mellitus Coronary artery disease with bare-metal stents into 3 RCA Esophagitis A. fib Peripheral neuropathy  Past surgical history Distal pancreatectomy with pancreaticojejunostomy 07/31/2017 Transmetatarsal amputation right foot August 2018 Past Surgical History:  Procedure Laterality Date  . CORONARY ANGIOPLASTY WITH STENT PLACEMENT    . ESOPHAGOGASTRODUODENOSCOPY N/A 09/11/2017   Procedure: ESOPHAGOGASTRODUODENOSCOPY (EGD);  Surgeon: Virgel Manifold, MD;  Location: [facility] ENDOSCOPY;  Service: Endoscopy;  Laterality: N/A;  . LOWER EXTREMITY ANGIOGRAPHY Left 11/17/2018   Procedure: Lower Extremity Angiography, with possible intervention;  Surgeon: Algernon Huxley, MD;  Location: [facility] LAB;  Service: Cardiovascular;  Laterality: Left;  . LOWER EXTREMITY ANGIOGRAPHY Left 03/04/2019   Procedure: Lower Extremity Angiography;  Surgeon: Algernon Huxley, MD;  Location: [facility];  Service: Cardiovascular;  Laterality: Left;  . pancreas removed      Social History   Socioeconomic History  .  Marital status: Single    Spouse name: Not on file  . Number of children: Not on file  . Years of education: Not on file  . Highest education level: Not on file  Occupational History  . Not on file  Social Needs  . Financial resource strain: Not on file  . Food insecurity:    Worry: Not on file     Inability: Not on file  . Transportation needs:    Medical: Not on file    Non-medical: Not on file  Tobacco Use  . Smoking status: Current Every Day Smoker    Packs/day: 0.50  . Smokeless tobacco: Never Used  Substance and Sexual Activity  . Alcohol use: No    Alcohol/week: 0.0 standard drinks  . 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 Cocaine used over a year ago per pt.  . Sexual activity: Not on file  Lifestyle  . Physical activity:    Days per week: Not on file    Minutes per session: Not on file  . Stress: Not on file  Relationships  . Social connections:    Talks on phone: Not on file    Gets together: Not on file    Attends religious service: Not on file    Active member of club or organization: Not on file    Attends meetings of clubs or organizations: Not on file    Relationship status: Not on file  . Intimate partner violence:    Fear of current or ex partner: Not on file    Emotionally abused: Not on file    Physically abused: Not on file    Forced sexual activity: Not on file  Other Topics Concern  . Not on file  Social History Narrative  . Not on file    Family History  Problem Relation Age of Onset  . CAD Mother   . Diabetes Mellitus II Mother   . Diabetes Mellitus II Father    No Known Allergies  ? Current Facility-Administered Medications  Medication Dose Route Frequency Provider Last Rate Last Dose  . acetaminophen (TYLENOL) tablet 650 mg  650 mg Oral Q6H PRN Loletha Grayer, MD   650 mg at 03/08/19 2105  . aspirin chewable tablet 81 mg  81 mg Oral Daily Algernon Huxley, MD   81 mg at 03/10/19 0843  . cefTRIAXone (ROCEPHIN) 2 g in sodium chloride 0.9 % 100 mL IVPB  2 g Intravenous Q24H Lance Coon, MD   Stopped at 03/09/19 2253  . enoxaparin (LOVENOX) injection 40 mg  40 mg Subcutaneous Q24H Algernon Huxley, MD   40 mg at 03/10/19 0843  . HYDROcodone-acetaminophen (NORCO/VICODIN) 5-325 MG per tablet 1 tablet  1  tablet Oral Q4H PRN [name], MD   1 tablet at 03/09/19 2222  . insulin aspart (novoLOG) injection 0-5 Units  0-5 Units Subcutaneous QHS Lance Coon, MD   3 Units at 03/09/19 2222  . insulin aspart (novoLOG) injection 0-9 Units  0-9 Units Subcutaneous TID WC Lance Coon, MD   7 Units at 03/10/19 1333  . insulin aspart (novoLOG) injection 8 Units  8 Units Subcutaneous TID WC [name], MD   8 Units at 03/10/19 1332  . insulin glargine (LANTUS) injection 13 Units  13 Units Subcutaneous Daily [name], MD   13 Units at 03/10/19 1023  . MEDLINE mouth rinse  15 mL Mouth Rinse BID Algernon Huxley,  MD   15 mL at 03/10/19 0858  . morphine 2 MG/ML injection 1 mg  1 mg Intravenous Q4H PRN Loletha Grayer, MD   1 mg at 03/10/19 0858  . naloxone [facility]) injection 0.4 mg  0.4 mg Intravenous PRN Loletha Grayer, MD      . ondansetron [facility]) injection 4 mg  4 mg Intravenous Q6H PRN Kris Hartmann, NP      . protein supplement (ENSURE MAX) liquid  11 oz Oral BID BM [name], MD      . rosuvastatin (CRESTOR) tablet 40 mg  40 mg Oral q1800 Neoma Laming A, MD   40 mg at 03/09/19 1735  . sodium chloride flush (NS) 0.9 % injection 10 mL  10 mL Intravenous Q12H Loletha Grayer, MD   10 mL at 03/10/19 0843  . sodium chloride flush (NS) 0.9 % injection 10 mL  10 mL Intravenous PRN Loletha Grayer, MD   10 mL at 03/06/19 1816     Abtx:  Anti-infectives (From admission, onward)   Start     Dose/Rate Route Frequency Ordered Stop   03/09/19 0600  ceFAZolin (ANCEF) IVPB 2g/100 mL premix  Status:  Discontinued     2 g 200 mL/hr over 30 Minutes Intravenous On call to O.R. 03/08/19 2326 03/08/19 2331   03/08/19 2326  ceFAZolin (ANCEF) IVPB 2g/100 mL premix  Status:  Discontinued     2 g 200 mL/hr over 30 Minutes Intravenous 30 min pre-op 03/08/19 2326 03/09/19 0940   03/08/19 2100  cefTRIAXone (ROCEPHIN) 2 g in sodium chloride 0.9 % 100 mL IVPB     2 g 200 mL/hr over 30 Minutes Intravenous Every 24 hours  03/08/19 2048     03/04/19 1214  ceFAZolin (ANCEF) 2-4 GM/100ML-% IVPB    Note to Pharmacy:  Despina Arias  : cabinet override      03/04/19 1214 03/04/19 1900   03/04/19 1210  ceFAZolin (ANCEF) IVPB 2g/100 mL premix  Status:  Discontinued     2 g 200 mL/hr over 30 Minutes Intravenous 30 min pre-op 03/04/19 1211 03/04/19 1653   03/03/19 1800  cefTRIAXone (ROCEPHIN) 1 g in sodium chloride 0.9 % 100 mL IVPB  Status:  Discontinued     1 g 200 mL/hr over 30 Minutes Intravenous Every 24 hours 03/03/19 1217 03/03/19 1230   03/03/19 1800  cefTRIAXone (ROCEPHIN) 2 g in sodium chloride 0.9 % 100 mL IVPB     2 g 200 mL/hr over 30 Minutes Intravenous Every 24 hours 03/03/19 1230 03/07/19 1739   03/02/19 1100  meropenem (MERREM) 1 g in sodium chloride 0.9 % 100 mL IVPB  Status:  Discontinued     1 g 200 mL/hr over 30 Minutes Intravenous Every 8 hours 03/02/19 1035 03/03/19 1217   03/01/19 1000  ceFEPIme (MAXIPIME) 2 g in sodium chloride 0.9 % 100 mL IVPB  Status:  Discontinued    Note to Pharmacy:  Pharmacy to dose   2 g 200 mL/hr over 30 Minutes Intravenous Every 12 hours 03/01/19 0742 03/01/19 0836   03/01/19 0800  vancomycin (VANCOCIN) IVPB 1000 mg/200 mL premix  Status:  Discontinued     1,000 mg 200 mL/hr over 60 Minutes Intravenous Every 36 hours 03/01/19 0735 03/01/19 0836   02/28/19 2200  ceFEPIme (MAXIPIME) 1 g in sodium chloride 0.9 % 100 mL IVPB  Status:  Discontinued    Note to Pharmacy:  Pharmacy to dose   1 g 200 mL/hr over 30 Minutes  Intravenous Every 12 hours 02/28/19 1321 03/01/19 0742   02/28/19 1352  vancomycin variable dose per unstable renal function (pharmacist dosing)  Status:  Discontinued      Does not apply See admin instructions 02/28/19 1354 03/01/19 0735   02/28/19 1330  vancomycin (VANCOCIN) IVPB 1000 mg/200 mL premix  Status:  Discontinued    Note to Pharmacy:  Pharmacy to dose   1,000 mg 200 mL/hr over 60 Minutes Intravenous  Once 02/28/19 1321 02/28/19 1354    02/28/19 0900  ceFEPIme (MAXIPIME) 2 g in sodium chloride 0.9 % 100 mL IVPB     2 g 200 mL/hr over 30 Minutes Intravenous  Once 02/28/19 0856 02/28/19 1100   02/28/19 0900  vancomycin (VANCOCIN) IVPB 1000 mg/200 mL premix     1,000 mg 200 mL/hr over 60 Minutes Intravenous  Once 02/28/19 0856 02/28/19 1143      REVIEW OF SYSTEMS:  Const:  fever, negative chills,  Eyes: negative diplopia or visual changes, negative eye pain ENT: negative coryza, negative sore throat Resp: negative cough, hemoptysis, dyspnea Cards: negative for chest pain, palpitations, lower extremity edema GU: negative for frequency, dysuria and hematuria GI: poor appetite and vomiting Skin: negative for rash and pruritus Heme: negative for easy bruising and gum/nose bleeding MS: generalized weakness Neurolo:headaches, dizziness, confusion memory problems  Psych: negative for feelings of anxiety, depression  Endocrine: polyuria and polydipsia Allergy/Immunology- negative for any medication or food allergies ?  Objective:  VITALS:  BP 125/76   Pulse 92   Temp 98.4 F (36.9 C) (Oral)   Resp 16   Ht 5\' 5"  (1.651 m)   Wt 57.3 kg   SpO2 94%   BMI 21.02 kg/m  PHYSICAL EXAM:  General: Alert, cooperative, no distress, appears stated age.  Head: Normocephalic, without obvious abnormality, atraumatic. Eyes: Conjunctivae clear, anicteric sclerae. Pupils are equal ENT Nares normal. No drainage or sinus tenderness. Lips, mucosa, and tongue normal. No Thrush Neck: Supple, symmetrical, no adenopathy, thyroid: non tender no carotid bruit and no JVD. Back: No CVA tenderness. Lungs: b/l air entry Heart: s1s2 Abdomen: surgical scar, Soft, non-tender,not distended. Bowel sounds normal. No masses Extremities: rt TMA stump healthy Left foot gangrene extending to the lowe rthird of the leg with necrotic wound    Skin: No rashes or lesions. Or bruising Lymph: Cervical, supraclavicular normal. Neurologic: Grossly  non-focal Pertinent Labs Lab Results CBC    Component Value Date/Time   WBC 20.2 (H) 03/10/2019 0543   RBC 2.53 (L) 03/10/2019 0543   HGB 7.2 (L) 03/10/2019 0543   HCT 22.3 (L) 03/10/2019 0543   PLT 550 (H) 03/10/2019 0543   MCV 88.1 03/10/2019 0543   MCH 28.5 03/10/2019 0543   MCHC 32.3 03/10/2019 0543   RDW 15.5 03/10/2019 0543   LYMPHSABS 2.0 03/10/2019 0543   MONOABS 1.9 (H) 03/10/2019 0543   EOSABS 0.1 03/10/2019 0543   BASOSABS 0.1 03/10/2019 0543    CMP Latest Ref Rng & Units 03/10/2019 03/09/2019 03/05/2019  Glucose 70 - 99 mg/dL 325(H) 220(H) 125(H)  BUN 8 - 23 mg/dL 11 10 14   Creatinine 0.61 - 1.24 mg/dL 0.64 0.57(L) 0.69  Sodium 135 - 145 mmol/L 135 137 138  Potassium 3.5 - 5.1 mmol/L 4.5 4.4 3.8  Chloride 98 - 111 mmol/L 99 101 104  CO2 22 - 32 mmol/L 26 29 28   Calcium 8.9 - 10.3 mg/dL 7.9(L) 7.8(L) 7.3(L)  Total Protein 6.5 - 8.1 g/dL 6.4(L) - -  Total Bilirubin 0.3 -  1.2 mg/dL 0.5 - -  Alkaline Phos 38 - 126 U/L 35(L) - -  AST 15 - 41 U/L 14(L) - -  ALT 0 - 44 U/L 8 - -      Microbiology: Recent Results (from the past 240 hour(s))  CULTURE, BLOOD (ROUTINE X 2) w Reflex to ID Panel     Status: None   Collection Time: 03/05/19  9:56 AM  Result Value Ref Range Status   Specimen Description BLOOD LEFT HAND  Final   Special Requests   Final    BOTTLES DRAWN AEROBIC AND ANAEROBIC Blood Culture results may not be optimal due to an excessive volume of blood received in culture bottles   Culture   Final    NO GROWTH 5 DAYS Performed at [facility], [address] [place], [place] [address]    Report Status 03/10/2019 FINAL  Final  CULTURE, BLOOD (ROUTINE X 2) w Reflex to ID Panel     Status: None   Collection Time: 03/05/19  9:56 AM  Result Value Ref Range Status   Specimen Description BLOOD RIGHT FA  Final   Special Requests   Final    BOTTLES DRAWN AEROBIC AND ANAEROBIC Blood Culture adequate volume   Culture   Final    NO GROWTH 5 DAYS  Performed at [facility], [address] [place], [place] [address]    Report Status 03/10/2019 FINAL  Final  CULTURE, BLOOD (ROUTINE X 2) w Reflex to ID Panel     Status: None (Preliminary result)   Collection Time: 03/09/19 12:55 PM  Result Value Ref Range Status   Specimen Description BLOOD LEFT ARM  Final   Special Requests   Final    BOTTLES DRAWN AEROBIC AND ANAEROBIC Blood Culture adequate volume   Culture   Final    NO GROWTH < 24 HOURS Performed at [facility], [address] [place], [place] [address]    Report Status PENDING  Incomplete  CULTURE, BLOOD (ROUTINE X 2) w Reflex to ID Panel     Status: None (Preliminary result)   Collection Time: 03/09/19  1:00 PM  Result Value Ref Range Status   Specimen Description BLOOD LEFT HAND  Final   Special Requests   Final    BOTTLES DRAWN AEROBIC AND ANAEROBIC Blood Culture adequate volume   Culture   Final    NO GROWTH < 24 HOURS Performed at [facility], [address] [place], [place] [address]    Report Status PENDING  Incomplete    IMAGING RESULTS: Right Central line remains in place, unchanged. Interval removal of NG tube. Heart is normal size. Lungs clear. No effusions or acute bony abnormality I have personally reviewed the films ? Impression/Recommendation ? New fever and worsening leucocytosis is secondary to the gangrene and necrotic wound left leg-/foot- HE is currently on Ceftriaxone- Change to pip/Tazo for anaerobic and better gram neg coverage. He needs BKA  Proteus bacteremia- secondary to the wound  PAD s/p angio, stent  DKA with metabolic encephalopathy-resolved  Acute resp failure- resolved- was intubated for a day  Cocaine abuse  Anemia- s/p PRBC today Rt TMA  H/o Pancreatic malignancy s/p whipple ? ? ___________________________________________________ Discussed with patient,and his nurses Note:  This document was prepared using Dragon voice recognition  software and may include unintentional dictation errors.

## 2019-03-10 NOTE — Progress Notes (Addendum)
Daily Progress Note   Patient Name: Richard [name]       Date: 03/10/2019 DOB: 1956-07-13  Age: 63 y.o. MRN#: [id number] Attending Physician: [name], MD Primary Care Physician: [facility] [place] Date: 02/28/2019  Reason for Consultation/Follow-up: Establishing goals of care  Subjective: Patient resting in bed with lights off. As I sat by him, he received 3 phone calls within a matter of a few minutes. He states he has been unplugging his room phone to have peace and get sleep. He states his surgery is tomorrow, and he is ready for it. He states he is ready for his amputation. He is hopeful this will help his pain. He states he has been praying and psyching himself up. He states his family is supportive of him having surgery.   He confirms DNR/DNI status, and wants all other care availalble.    Length of Stay: 10  Current Medications: Scheduled Meds:  . sodium chloride   Intravenous Once  . acetaminophen  650 mg Oral Once  . aspirin  81 mg Oral Daily  . enoxaparin (LOVENOX) injection  40 mg Subcutaneous Q24H  . insulin aspart  0-5 Units Subcutaneous QHS  . insulin aspart  0-9 Units Subcutaneous TID WC  . insulin aspart  8 Units Subcutaneous TID WC  . insulin glargine  13 Units Subcutaneous Daily  . mouth rinse  15 mL Mouth Rinse BID  . Ensure Max Protein  11 oz Oral BID BM  . rosuvastatin  40 mg Oral q1800  . sodium chloride flush  10 mL Intravenous Q12H    Continuous Infusions: . cefTRIAXone (ROCEPHIN)  IV Stopped (03/09/19 2253)    PRN Meds: acetaminophen, HYDROcodone-acetaminophen, morphine injection, naLOXone (NARCAN)  injection, ondansetron (ZOFRAN) IV, sodium chloride flush  Physical Exam Pulmonary:     Effort: Pulmonary effort is normal.   Skin:    General: Skin is warm and dry.  Neurological:     Mental Status: He is alert.     Comments: Oriented             Vital Signs: BP 129/69 (BP Location: Left Arm)   Pulse 94   Temp 99.5 F (37.5 C) (Oral)   Resp 16   Ht 5\' 5"  (1.651 m)   Wt 57.3 kg   SpO2 94%  BMI 21.02 kg/m  SpO2: SpO2: 94 % O2 Device: O2 Device: Room Air O2 Flow Rate: O2 Flow Rate (L/min): 2 L/min  Intake/output summary:   Intake/Output Summary (Last 24 hours) at 03/10/2019 1229 Last data filed at 03/10/2019 1148 Gross per 24 hour  Intake 680.65 ml  Output 1600 ml  Net -919.35 ml   LBM: Last BM Date: 03/09/19 Baseline Weight: Weight: 54 kg Most recent weight: Weight: 57.3 kg       Palliative Assessment/Data:    Flowsheet Rows     Most Recent Value  Intake Tab  Referral Department  Critical care  Unit at Time of Referral  ICU  Palliative Care Primary Diagnosis  Cancer  Date Notified  02/28/19  Palliative Care Type  New Palliative care  Reason for referral  Clarify Goals of Care  Date of Admission  02/28/19  Date first seen by Palliative Care  03/02/19  # of days Palliative referral response time  2 Day(s)  # of days IP prior to Palliative referral  0  Clinical Assessment  Psychosocial & Spiritual Assessment  Palliative Care Outcomes      Patient Active Problem List   Diagnosis Date Noted  . Respiratory failure ([place]) 02/28/2019  . Diabetes with hyperosmolar coma ([place]) 11/11/2018  . A-fib ([place]) 09/20/2018  . Hyperglycemia 08/03/2018  . Protein-calorie malnutrition, severe 01/10/2018  . Sepsis ([place]) 01/09/2018  . DKA, type 2 ([place]) 09/05/2017  . Ileus ([place]) 08/24/2017  . Diabetes ([place]) 08/24/2017  . Atrial flutter ([place]) 08/24/2017  . CAD (coronary artery disease) 08/24/2017  . HTN (hypertension) 08/24/2017  . Atrial fibrillation ([place]) 08/24/2017  . Tobacco use disorder, severe, dependence 07/25/2017  . Wound dehiscence, surgical, initial encounter 06/24/2017  . Acute  hematogenous osteomyelitis of right foot ([place]) 06/20/2017  . Adenocarcinoma of pancreas ([place]) 06/20/2017  . Esophagitis 06/20/2017  . Cellulitis of right lower extremity 06/10/2017  . Hypertension, poor control 06/10/2017  . Insulin dependent type 2 diabetes mellitus ([place]) 06/10/2017  . Chest pain 08/10/2015    Palliative Care Assessment & Plan    Recommendations/Plan:  Recommend regional anesthesia to place an On Q/Qball  day of surgery for post op pain management.   Recommend psych consult to help with substance abuse issues as he wants to change his lifestyle.      Code Status:    Code Status Orders  (From admission, onward)         Start     Ordered   03/02/19 1454  Full code  Continuous     03/02/19 1454        Code Status History    Date Active Date Inactive Code Status Order ID Comments User Context   02/28/2019 1449 03/01/2019 0836 Full Code [id number]  Gorden Harms, MD Inpatient   02/28/2019 1449 02/28/2019 1449 Full Code [id number]  Gorden Harms, MD Inpatient   11/11/2018 0758 11/18/2018 1814 DNR [id number]  Loletha Grayer, MD ED   09/20/2018 1635 09/24/2018 1854 Full Code [id number]  Fritzi Mandes, MD ED   08/03/2018 1020 08/04/2018 1813 Full Code [id number]  Salary, Avel Peace, MD ED   01/09/2018 1025 01/11/2018 1932 Full Code [id number]  Harrie Foreman, MD Inpatient   09/05/2017 1559 09/11/2017 1928 Full Code [id number]  Fritzi Mandes, MD Inpatient   08/24/2017 0238 08/29/2017 2131 Full Code [id number]  Lance Coon, MD Inpatient   08/10/2015 0822 08/11/2015 1947 Full Code [id number]  Harrie Foreman, MD Inpatient  Prognosis:   Unable to determine  Discharge Planning:  To Be Determined   Thank you for allowing the Palliative Medicine Team to assist in the care of this patient.   Total Time 35 min Prolonged Time Billed  no       Greater than 50%  of this time was spent counseling and coordinating care related to the above assessment and plan.   Asencion Gowda, NP  Please contact Palliative Medicine Team phone at [phone] for questions and concerns.

## 2019-03-10 NOTE — Progress Notes (Signed)
Patient ID: Richard [name], male   DOB: November 22, 1955, 63 y.o.   MRN: [id number]   [employer] [facility] PROGRESS NOTE  Richard [name] [id number] DOB: June 21, 1956 DOA: 02/28/2019 PCP: [facility] [place]  HPI/Subjective: Patient has fever this morning.  He has no complaints. Hb 7.2.  PRBC transfusion per Dr. Lucky Cowboy. Objective: Vitals:   03/10/19 1507 03/10/19 1537  BP: 125/76 127/73  Pulse: 92 89  Resp: 16 17  Temp: 98.4 F (36.9 C) 98.8 F (37.1 C)  SpO2: 94% 98%    Filed Weights   02/28/19 1443 03/01/19 0416 03/04/19 1248  Weight: 55 kg 57.3 kg 57.3 kg    ROS: Review of Systems  Constitutional: Negative for chills and fever.  Eyes: Negative for blurred vision.  Respiratory: Negative for cough and shortness of breath.   Cardiovascular: Negative for chest pain.  Gastrointestinal: Negative for abdominal pain, constipation, diarrhea, nausea and vomiting.  Genitourinary: Negative for dysuria.  Musculoskeletal: Positive for joint pain.  Neurological: Negative for dizziness and headaches.   Exam: Physical Exam  HENT:  Nose: No mucosal edema.  Mouth/Throat: No oropharyngeal exudate or posterior oropharyngeal edema.  Eyes: Pupils are equal, round, and reactive to light. Conjunctivae and lids are normal.  Neck: No JVD present. Carotid bruit is not present. No edema present. No thyroid mass and no thyromegaly present.  Cardiovascular: S1 normal and S2 normal. Exam reveals no gallop.  No murmur heard. Respiratory: No respiratory distress. He has no wheezes. He has no rhonchi. He has no rales.  GI: Soft. Bowel sounds are normal. There is no abdominal tenderness.  Musculoskeletal:     Right ankle: He exhibits no swelling.     Left ankle: He exhibits no swelling.     Comments: Gangrenous left first and second toe  Lymphadenopathy:    He has no cervical adenopathy.  Neurological: He is alert.  Moves all extremities on his own  Skin: Skin is warm. Nails show no clubbing.   Left first and second toe gangrenous.  Demarcation starting to go up the left foot now.  Has some blistering on the left foot.  Prior amputation of the right trans-met. 2 stage II decubiti noninfected on the buttock sacral area.  Psychiatric: He has a normal mood and affect.      Data Reviewed: Basic Metabolic Panel: Recent Labs  Lab 03/04/19 0548 03/05/19 0442 03/09/19 0422 03/10/19 0543  NA 140 138 137 135  K 4.4 3.8 4.4 4.5  CL 106 104 101 99  CO2 _0 GLUCOSE 384* 125* 220* 325*  BUN _1 CREATININE 0.74 0.69 0.57* 0.64  CALCIUM 7.5* 7.3* 7.8* 7.9*  MG  --   --   --  2.1  PHOS  --   --   --  3.2   Liver Function Tests: Recent Labs  Lab 03/04/19 0548 03/10/19 0543  AST 26 14*  ALT 14 8  ALKPHOS 35* 35*  BILITOT 0.5 0.5  PROT 5.6* 6.4*  ALBUMIN 2.3* 2.0*   No results for input(s): LIPASE, AMYLASE in the last 168 hours. CBC: Recent Labs  Lab 03/05/19 0442 03/07/19 0613 03/08/19 0422 03/09/19 0422 03/10/19 0543  WBC 10.7* 13.7* 15.8* 17.8* 20.2*  NEUTROABS  --   --   --  12.9* 16.0*  HGB 7.7* 7.3* 7.4* 7.1* 7.2*  HCT 23.3* 22.3* 23.1* 22.3* 22.3*  MCV 85.7 87.5 87.2 87.1 88.1  PLT 204 307 384 469* 550*   Cardiac  Enzymes: No results for input(s): CKTOTAL, CKMB, CKMBINDEX, TROPONINI in the last 168 hours.  CBG: Recent Labs  Lab 03/09/19 1634 03/09/19 2206 03/10/19 0410 03/10/19 0743 03/10/19 1133  GLUCAP 303* 266* 312* 298* 304*    Recent Results (from the past 240 hour(s))  CULTURE, BLOOD (ROUTINE X 2) w Reflex to ID Panel     Status: None   Collection Time: 03/05/19  9:56 AM  Result Value Ref [name] Status   Specimen Description BLOOD LEFT HAND  Final   Special Requests   Final    BOTTLES DRAWN AEROBIC AND ANAEROBIC Blood Culture results may not be optimal due to an excessive volume of blood received in culture bottles   Culture   Final    NO GROWTH 5 DAYS Performed at [facility], [address]  [place], [place] [address]    Report Status 03/10/2019 FINAL  Final  CULTURE, BLOOD (ROUTINE X 2) w Reflex to ID Panel     Status: None   Collection Time: 03/05/19  9:56 AM  Result Value Ref [name] Status   Specimen Description BLOOD RIGHT FA  Final   Special Requests   Final    BOTTLES DRAWN AEROBIC AND ANAEROBIC Blood Culture adequate volume   Culture   Final    NO GROWTH 5 DAYS Performed at [facility], [address] [place], [place] [address]    Report Status 03/10/2019 FINAL  Final  CULTURE, BLOOD (ROUTINE X 2) w Reflex to ID Panel     Status: None (Preliminary result)   Collection Time: 03/09/19 12:55 PM  Result Value Ref [name] Status   Specimen Description BLOOD LEFT ARM  Final   Special Requests   Final    BOTTLES DRAWN AEROBIC AND ANAEROBIC Blood Culture adequate volume   Culture   Final    NO GROWTH < 24 HOURS Performed at [facility], [address] [place], [place] [address]    Report Status PENDING  Incomplete  CULTURE, BLOOD (ROUTINE X 2) w Reflex to ID Panel     Status: None (Preliminary result)   Collection Time: 03/09/19  1:00 PM  Result Value Ref [name] Status   Specimen Description BLOOD LEFT HAND  Final   Special Requests   Final    BOTTLES DRAWN AEROBIC AND ANAEROBIC Blood Culture adequate volume   Culture   Final    NO GROWTH < 24 HOURS Performed at [facility], [address] [place], [place] [address]    Report Status PENDING  Incomplete     Studies: Dg Chest Port 1 View  Result Date: 03/09/2019 CLINICAL DATA:  Fever EXAM: PORTABLE CHEST 1 VIEW COMPARISON:  03/02/2019 FINDINGS: Right Central line remains in place, unchanged. Interval removal of NG tube. Heart is normal size. Lungs clear. No effusions or acute bony abnormality. IMPRESSION: No active cardiopulmonary disease. Electronically Signed   By: Rolm Baptise M.D.   On: 03/09/2019 14:25    Scheduled Meds: . aspirin  81 mg Oral Daily  . enoxaparin (LOVENOX)  injection  40 mg Subcutaneous Q24H  . insulin aspart  0-5 Units Subcutaneous QHS  . insulin aspart  0-9 Units Subcutaneous TID WC  . insulin aspart  8 Units Subcutaneous TID WC  . insulin glargine  13 Units Subcutaneous Daily  . mouth rinse  15 mL Mouth Rinse BID  . Ensure Max Protein  11 oz Oral BID BM  . rosuvastatin  40 mg Oral q1800  . sodium  chloride flush  10 mL Intravenous Q12H   Continuous Infusions: . cefTRIAXone (ROCEPHIN)  IV Stopped (03/09/19 2253)    Assessment/Plan:  1.  Sepsis with Proteus vulgaris.  Likely the gangrenous toes as the source.  Demarcation starting to go up to the left foot.  Patient with low-grade temperature.  Repeat blood cultures so far negative.  Patient on Rocephin.  Patient has poor circulation and would need a revascularization procedure prior to amputation in order for it to heal.  Patient prefers surgery at this time. Fever and worsening leukocytosis.  Continue antibiotics, f/u repeat blood culture and follow up CBC. ID consult.  2.  Severe peripheral vascular disease.  Angiogram showing poor blood supply to the lower extremities. L CFA endarterectomy planned tomorrow.  3.  Acute hypoxic respiratory failure.  Patient tapered off oxygen 4.  Acute myocardial infarction.  Conservative management as per cardiology.  Patient on aspirin.  Hold off on Brilinta because the patient may need a amputation.  High-dose Crestor started. 5.  Hyperglycemia due to type 2 diabetes mellitus.   Increase Lantus to 16 units with sliding scale. Added novolog 10 unit AC. 6.  Acute kidney injury normalized with IV fluids. 7.  Cocaine abuse 8.  Chronic atrial fibrillation.  We will have to discuss anticoagulation after procedure. 9.  History of pancreatic cancer status post Whipple procedure in 2018 10.  Hypernatremia.  This has normalized. 11.  Anemia of chronic disease.  Patient has received 2 units of packed red blood cells. Hb down to 7.2. no active bleeding.  Per Dr.  Lucky Cowboy, 1 unit PRBC transfusion for procedure tomorrow, follow-up hemoglobin. 12.  2 stage II decubitus on buttocks.  Local wound care.  Code Status:     Code Status Orders  (From admission, onward)         Start     Ordered   03/02/19 1454  Full code  Continuous     03/02/19 1454        Code Status History    Date Active Date Inactive Code Status Order ID Comments User Context   02/28/2019 1449 03/01/2019 0836 Full Code [id number]  Gorden Harms, MD Inpatient   02/28/2019 1449 02/28/2019 1449 Full Code [id number]  Gorden Harms, MD Inpatient   11/11/2018 0758 11/18/2018 1814 DNR [id number]  Loletha Grayer, MD ED   09/20/2018 1635 09/24/2018 1854 Full Code [id number]  Fritzi Mandes, MD ED   08/03/2018 1020 08/04/2018 1813 Full Code [id number]  Salary, Avel Peace, MD ED   01/09/2018 1025 01/11/2018 1932 Full Code [id number]  Harrie Foreman, MD Inpatient   09/05/2017 1559 09/11/2017 1928 Full Code [id number]  Fritzi Mandes, MD Inpatient   08/24/2017 0238 08/29/2017 2131 Full Code [id number]  Lance Coon, MD Inpatient   08/10/2015 0822 08/11/2015 1947 Full Code [id number]  Harrie Foreman, MD Inpatient     Disposition Plan: TBD  Consultants:  Critical care specialist  Cardiology  Palliative care  Vascular surgery  Podiatry  Antibiotics:  Rocephin  Time spent: 35 minutes.  Spoke with patient's significant other Ms. Lewis on the phone. [name]  [employer]

## 2019-03-11 DIAGNOSIS — E11621 Type 2 diabetes mellitus with foot ulcer: Secondary | ICD-10-CM

## 2019-03-11 DIAGNOSIS — L97529 Non-pressure chronic ulcer of other part of left foot with unspecified severity: Secondary | ICD-10-CM

## 2019-03-11 DIAGNOSIS — Z89429 Acquired absence of other toe(s), unspecified side: Secondary | ICD-10-CM

## 2019-03-11 DIAGNOSIS — Z90411 Acquired partial absence of pancreas: Secondary | ICD-10-CM

## 2019-03-11 LAB — BASIC METABOLIC PANEL
Anion gap: 9 (ref 5–15)
BUN: 14 mg/dL (ref 8–23)
CO2: 26 mmol/L (ref 22–32)
Calcium: 7.8 mg/dL — ABNORMAL LOW (ref 8.9–10.3)
Chloride: 95 mmol/L — ABNORMAL LOW (ref 98–111)
Creatinine, Ser: 0.83 mg/dL (ref 0.61–1.24)
GFR calc Af Amer: >60 mL/min (ref >60)
GFR calc non Af Amer: >60 mL/min (ref >60)
Glucose, Bld: 280 mg/dL — ABNORMAL HIGH (ref 70–99)
Potassium: 4.1 mmol/L (ref 3.5–5.1)
Sodium: 130 mmol/L — ABNORMAL LOW (ref 135–145)

## 2019-03-11 LAB — CBC
HCT: 26 % — ABNORMAL LOW (ref 39.0–52.0)
Hemoglobin: 8.6 g/dL — ABNORMAL LOW (ref 13.0–17.0)
MCH: 28.7 pg (ref 26.0–34.0)
MCHC: 33.1 g/dL (ref 30.0–36.0)
MCV: 86.7 fL (ref 80.0–100.0)
Platelets: 585 10*3/uL — ABNORMAL HIGH (ref 150–400)
RBC: 3 MIL/uL — ABNORMAL LOW (ref 4.22–5.81)
RDW: 15.1 % (ref 11.5–15.5)
WBC: 21.5 10*3/uL — ABNORMAL HIGH (ref 4.0–10.5)
nRBC: 0.1 % (ref 0.0–0.2)

## 2019-03-11 LAB — GLUCOSE, CAPILLARY
Glucose-Capillary: 197 mg/dL — ABNORMAL HIGH (ref 70–99)
Glucose-Capillary: 304 mg/dL — ABNORMAL HIGH (ref 70–99)
Glucose-Capillary: 251 mg/dL — ABNORMAL HIGH (ref 70–99)
Glucose-Capillary: 200 mg/dL — ABNORMAL HIGH (ref 70–99)
Glucose-Capillary: 217 mg/dL — ABNORMAL HIGH (ref 70–99)

## 2019-03-11 LAB — POCT CBG MONITORING: CBG: 136

## 2019-03-11 LAB — MAGNESIUM: Magnesium: 2.2 mg/dL (ref 1.7–2.4)

## 2019-03-11 MED ORDER — SODIUM CHLORIDE 0.9 % IV SOLN
INTRAVENOUS | Status: DC
  Administered 2019-03-12 (×2): via INTRAVENOUS

## 2019-03-11 NOTE — Progress Notes (Signed)
[facility]  Daily Progress Note   Subjective  - 7 Days Post-Op  Still with left foot pain.  No fever but WBC going up.  No major events.  Hgb up to 8.5 at this point.  Objective Vitals:   03/10/19 1537 03/10/19 1856 03/10/19 1957 03/11/19 0502  BP: 127/73 (!) 121/52 126/69 138/75  Pulse: 89 90 90 88  Resp: 17 17 18 18   Temp: 98.8 F (37.1 C) 98.9 F (37.2 C) 99.8 F (37.7 C) 99.4 F (37.4 C)  TempSrc: Oral Oral Oral Oral  SpO2: 98% 100% 100%   Weight:      Height:        Intake/Output Summary (Last 24 hours) at 03/11/2019 1122 Last data filed at 03/11/2019 1005 Gross per 24 hour  Intake 760.49 ml  Output 2450 ml  Net -1689.51 ml    PULM  CTAB CV  RRR VASC  Left foot with gangrenous changes.    Laboratory CBC    Component Value Date/Time   WBC 21.5 (H) 03/11/2019 0513   HGB 8.6 (L) 03/11/2019 0513   HCT 26.0 (L) 03/11/2019 0513   PLT 585 (H) 03/11/2019 0513    BMET    Component Value Date/Time   NA 130 (L) 03/11/2019 0513   K 4.1 03/11/2019 0513   CL 95 (L) 03/11/2019 0513   CO2 26 03/11/2019 0513   GLUCOSE 280 (H) 03/11/2019 0513   BUN 14 03/11/2019 0513   CREATININE 0.83 03/11/2019 0513   CALCIUM 7.8 (L) 03/11/2019 0513   GFRNONAA >60 03/11/2019 0513   GFRAA >60 03/11/2019 1829    Assessment/Planning: Left foot gangrene   For left femoral endarterectomy tomorrow. Leg is marked.  No OR time today and he had fever and anemia earlier in the week  Risks and benefits discussed with patient  Will still need amputation after endarterectomy, but hopefully at the below knee level with endarterectomy    Leotis Pain  03/11/2019, 11:22 AM

## 2019-03-11 NOTE — Progress Notes (Signed)
   Date of Admission:  02/28/2019      Subjective: No new complaints  Eating Pizza  Medications:  . aspirin  81 mg Oral Daily  . enoxaparin (LOVENOX) injection  40 mg Subcutaneous Q24H  . insulin aspart  0-5 Units Subcutaneous QHS  . insulin aspart  0-9 Units Subcutaneous TID WC  . insulin aspart  10 Units Subcutaneous TID WC  . insulin glargine  16 Units Subcutaneous Daily  . mouth rinse  15 mL Mouth Rinse BID  . Ensure Max Protein  11 oz Oral BID BM  . rosuvastatin  40 mg Oral q1800  . sodium chloride flush  10 mL Intravenous Q12H    Objective: Vital signs in last 24 hours: Temp:  [98.4 F (36.9 C)-101.2 F (38.4 C)] 99.4 F (37.4 C) (05/20 0502) Pulse Rate:  [88-94] 88 (05/20 0502) Resp:  [16-18] 18 (05/20 0502) BP: (117-138)/(52-76) 138/75 (05/20 0502) SpO2:  [94 %-100 %] 100 % (05/19 1957)  PHYSICAL EXAM:  Left foot gangrene, extending to the leg Lab Results Recent Labs    03/10/19 0543 03/10/19 2023 03/11/19 0513  WBC 20.2*  --  21.5*  HGB 7.2* 8.5* 8.6*  HCT 22.3* 25.8* 26.0*  NA 135  --  130*  K 4.5  --  4.1  CL 99  --  95*  CO2 26  --  26  BUN 11  --  14  CREATININE 0.64  --  0.83   Liver Panel Recent Labs    03/10/19 0543  PROT 6.4*  ALBUMIN 2.0*  AST 14*  ALT 8  ALKPHOS 35*  BILITOT 0.5   Microbiology: [facility] 5/9 proteus 5/14 NG 5/18 NG Studies/Results: Dg Chest Port 1 View  Result Date: 03/09/2019 CLINICAL DATA:  Fever EXAM: PORTABLE CHEST 1 VIEW COMPARISON:  03/02/2019 FINDINGS: Right Central line remains in place, unchanged. Interval removal of NG tube. Heart is normal size. Lungs clear. No effusions or acute bony abnormality. IMPRESSION: No active cardiopulmonary disease. Electronically Signed   By: Rolm Baptise M.D.   On: 03/09/2019 14:25     Assessment/Plan:  New fever and worsening leucocytosis is secondary to the gangrene and necrotic wound left leg-/foot- currently pip/Tazo for anaerobic and better gram neg coverage. He needs  BKA, he is going for thrombectomy tomorrow No fever today- leucocytosis persist   Proteus bacteremia- secondary to the wound  PAD s/p angio, stent  DKA with metabolic encephalopathy-resolved  Acute resp failure- resolved- was intubated for a day  Cocaine abuse  Anemia- s/p PRBC today Rt TMA  H/o Pancreatic malignancy s/p whipple  Discussed the management with the patient

## 2019-03-11 NOTE — Care Management Important Message (Signed)
Important Message  Patient Details  Name: Richard [name] MRN: [id number] Date of Birth: September 18, 1956   Medicare Important Message Given:  Yes    Juliann Pulse A Marcey Persad 03/11/2019, 10:59 AM

## 2019-03-11 NOTE — Progress Notes (Signed)
Patient ID: Richard [name], male   DOB: [birth year], 63 y.o.   MRN: [id number]   [employer] PROGRESS NOTE  Richard [name] HVF:[id number] DOB: 05-09-1956 DOA: 02/28/2019 PCP: [facility] [place]  HPI/Subjective: Patient has no fever and no complaints.  Objective: Vitals:   03/10/19 1957 03/11/19 0502  BP: 126/69 138/75  Pulse: 90 88  Resp: 18 18  Temp: 99.8 F (37.7 C) 99.4 F (37.4 C)  SpO2: 100%     Filed Weights   02/28/19 1443 03/01/19 0416 03/04/19 1248  Weight: 55 kg 57.3 kg 57.3 kg    ROS: Review of Systems  Constitutional: Negative for chills and fever.  Eyes: Negative for blurred vision.  Respiratory: Negative for cough and shortness of breath.   Cardiovascular: Negative for chest pain.  Gastrointestinal: Negative for abdominal pain, constipation, diarrhea, nausea and vomiting.  Genitourinary: Negative for dysuria.  Musculoskeletal: Positive for joint pain.  Neurological: Negative for dizziness and headaches.   Exam: Physical Exam  HENT:  Nose: No mucosal edema.  Mouth/Throat: No oropharyngeal exudate or posterior oropharyngeal edema.  Eyes: Pupils are equal, round, and reactive to light. Conjunctivae and lids are normal.  Neck: No JVD present. Carotid bruit is not present. No edema present. No thyroid mass and no thyromegaly present.  Cardiovascular: S1 normal and S2 normal. Exam reveals no gallop.  No murmur heard. Respiratory: No respiratory distress. He has no wheezes. He has no rhonchi. He has no rales.  GI: Soft. Bowel sounds are normal. There is no abdominal tenderness.  Musculoskeletal:     Right ankle: He exhibits no swelling.     Left ankle: He exhibits no swelling.     Comments: Gangrenous left first and second toe  Lymphadenopathy:    He has no cervical adenopathy.  Neurological: He is alert.  Moves all extremities on his own  Skin: Skin is warm. Nails show no clubbing.  Left first and second toe gangrenous.  Demarcation  starting to go up the left foot now.  Has some blistering on the left foot.  Prior amputation of the right trans-met. 2 stage II decubiti noninfected on the buttock sacral area.  Psychiatric: He has a normal mood and affect.      Data Reviewed: Basic Metabolic Panel: Recent Labs  Lab 03/05/19 0442 03/09/19 0422 03/10/19 0543 03/11/19 0513  NA 138 137 135 130*  K 3.8 4.4 4.5 4.1  CL 104 101 99 95*  CO2 28 29 26 26   GLUCOSE 125* 220* 325* 280*  BUN 14 10 11 14   CREATININE 0.69 0.57* 0.64 0.83  CALCIUM 7.3* 7.8* 7.9* 7.8*  MG  --   --  2.1 2.2  PHOS  --   --  3.2  --    Liver Function Tests: Recent Labs  Lab 03/10/19 0543  AST 14*  ALT 8  ALKPHOS 35*  BILITOT 0.5  PROT 6.4*  ALBUMIN 2.0*   No results for input(s): LIPASE, AMYLASE in the last 168 hours. CBC: Recent Labs  Lab 03/07/19 0613 03/08/19 0422 03/09/19 0422 03/10/19 0543 03/10/19 2023 03/11/19 0513  WBC 13.7* 15.8* 17.8* 20.2*  --  21.5*  NEUTROABS  --   --  12.9* 16.0*  --   --   HGB 7.3* 7.4* 7.1* 7.2* 8.5* 8.6*  HCT 22.3* 23.1* 22.3* 22.3* 25.8* 26.0*  MCV 87.5 87.2 87.1 88.1  --  86.7  PLT 307 384 469* 550*  --  585*   Cardiac Enzymes: No results for  input(s): CKTOTAL, CKMB, CKMBINDEX, TROPONINI in the last 168 hours.  CBG: Recent Labs  Lab 03/10/19 1133 03/10/19 1638 03/10/19 2050 03/11/19 0504 03/11/19 0801  GLUCAP 304* 197* 141* 304* 251*    Recent Results (from the past 240 hour(s))  CULTURE, BLOOD (ROUTINE X 2) w Reflex to ID Panel     Status: None   Collection Time: 03/05/19  9:56 AM  Result Value Ref Range Status   Specimen Description BLOOD LEFT HAND  Final   Special Requests   Final    BOTTLES DRAWN AEROBIC AND ANAEROBIC Blood Culture results may not be optimal due to an excessive volume of blood received in culture bottles   Culture   Final    NO GROWTH 5 DAYS Performed at [facility], [address] [place], [place] [address]    Report Status 03/10/2019  FINAL  Final  CULTURE, BLOOD (ROUTINE X 2) w Reflex to ID Panel     Status: None   Collection Time: 03/05/19  9:56 AM  Result Value Ref Range Status   Specimen Description BLOOD RIGHT FA  Final   Special Requests   Final    BOTTLES DRAWN AEROBIC AND ANAEROBIC Blood Culture adequate volume   Culture   Final    NO GROWTH 5 DAYS Performed at [facility], [address] [place], [place] [address]    Report Status 03/10/2019 FINAL  Final  CULTURE, BLOOD (ROUTINE X 2) w Reflex to ID Panel     Status: None (Preliminary result)   Collection Time: 03/09/19 12:55 PM  Result Value Ref Range Status   Specimen Description BLOOD LEFT ARM  Final   Special Requests   Final    BOTTLES DRAWN AEROBIC AND ANAEROBIC Blood Culture adequate volume   Culture   Final    NO GROWTH 2 DAYS Performed at [facility], [address], [place], [place] [address]    Report Status PENDING  Incomplete  CULTURE, BLOOD (ROUTINE X 2) w Reflex to ID Panel     Status: None (Preliminary result)   Collection Time: 03/09/19  1:00 PM  Result Value Ref Range Status   Specimen Description BLOOD LEFT HAND  Final   Special Requests   Final    BOTTLES DRAWN AEROBIC AND ANAEROBIC Blood Culture adequate volume   Culture   Final    NO GROWTH 2 DAYS Performed at [facility], [address], [place], [place] [address]    Report Status PENDING  Incomplete  Urine Culture     Status: None   Collection Time: 03/09/19  1:23 PM  Result Value Ref Range Status   Specimen Description   Final    URINE, CLEAN CATCH Performed at [facility], [address], [place], [place] [address]    Special Requests   Final    NONE Performed at [facility], [address], [place], [place] [address]    Culture   Final    NO GROWTH Performed at [facility], [place] [address] [place], [place] [address]    Report Status 03/10/2019 FINAL  Final     Studies: Dg Chest Port 1  View  Result Date: 03/09/2019 CLINICAL DATA:  Fever EXAM: PORTABLE CHEST 1 VIEW COMPARISON:  03/02/2019 FINDINGS: Right Central line remains in place, unchanged. Interval removal of NG tube. Heart is normal size. Lungs clear. No effusions or acute bony abnormality. IMPRESSION: No active cardiopulmonary disease. Electronically Signed   By: Lennette Bihari  [place] M.D.   On: 03/09/2019 14:25    Scheduled Meds: . aspirin  81 mg Oral Daily  . enoxaparin (LOVENOX) injection  40 mg Subcutaneous Q24H  . insulin aspart  0-5 Units Subcutaneous QHS  . insulin aspart  0-9 Units Subcutaneous TID WC  . insulin aspart  10 Units Subcutaneous TID WC  . insulin glargine  16 Units Subcutaneous Daily  . mouth rinse  15 mL Mouth Rinse BID  . Ensure Max Protein  11 oz Oral BID BM  . rosuvastatin  40 mg Oral q1800  . sodium chloride flush  10 mL Intravenous Q12H   Continuous Infusions: . [START ON 03/12/2019] sodium chloride    . piperacillin-tazobactam (ZOSYN)  IV 3.375 g (03/11/19 0856)    Assessment/Plan:  1.  Sepsis with Proteus vulgaris bacteremia.  Likely the gangrenous toes as the source.  Demarcation starting to go up to the left foot.  Patient with low-grade temperature.  Repeat blood cultures so far negative.  Patient on Rocephin.  Patient has poor circulation and would need a revascularization procedure prior to amputation in order for it to heal.  Patient prefers surgery at this time. Fever and worsening leukocytosis.  Continue antibiotics, f/u repeat blood culture and follow up CBC.   Change from Rocephin to Zosyn per Dr. Arlyss Repress.   2.  Severe peripheral vascular disease.  Angiogram showing poor blood supply to the lower extremities. L CFA endarterectomy tomorrow, then amputation per Dr. Lucky Cowboy.  3.  Acute hypoxic respiratory failure.  Patient tapered off oxygen. 4.  Acute myocardial infarction.  Conservative management as per cardiology.  Patient on aspirin.  Hold off on Brilinta because the patient may  need a amputation.  High-dose Crestor started. 5.  Hyperglycemia due to type 2 diabetes mellitus.   Increase Lantus to 16 units with sliding scale. Added novolog 10 unit AC. 6.  Acute kidney injury normalized with IV fluids. 7.  Cocaine abuse  8.  Chronic atrial fibrillation.  We will have to discuss anticoagulation after procedure. 9.  History of pancreatic cancer status post Whipple procedure in 2018 10.  Hypernatremia.  This has normalized. 11.  Anemia of chronic disease.  Patient has received 2 units of packed red blood cells. Hb down to 7.2. no active bleeding.  Up to 8.6 after 1 unit PRBC transfusion,  follow-up hemoglobin. 12.  2 stage II decubitus on buttocks.  Local wound care.  Code Status:     Code Status Orders  (From admission, onward)         Start     Ordered   03/02/19 1454  Full code  Continuous     03/02/19 1454        Code Status History    Date Active Date Inactive Code Status Order ID Comments User Context   02/28/2019 1449 03/01/2019 0836 Full Code [id number]  Gorden Harms, MD Inpatient   02/28/2019 1449 02/28/2019 1449 Full Code [id number]  Gorden Harms, MD Inpatient   11/11/2018 0758 11/18/2018 1814 DNR [id number]  Loletha Grayer, MD ED   09/20/2018 1635 09/24/2018 1854 Full Code [id number]  Fritzi Mandes, MD ED   08/03/2018 1020 08/04/2018 1813 Full Code [id number]  Salary, Avel Peace, MD ED   01/09/2018 1025 01/11/2018 1932 Full Code [id number]  Harrie Foreman, MD Inpatient   09/05/2017 1559 09/11/2017 1928 Full Code [id number]  Fritzi Mandes, MD Inpatient   08/24/2017 0238 08/29/2017 2131 Full Code [id number]  Lance Coon, MD Inpatient  08/10/2015 0822 08/11/2015 1947 Full Code [id number]  Harrie Foreman, MD Inpatient     Disposition Plan: TBD  Consultants:  Critical care specialist  Cardiology  Palliative care  Vascular surgery  Podiatry  Antibiotics:  Rocephin  Time spent: 28 minutes.  Spoke with patient's significant other Ms. Lewis on  the phone. [name]  [employer]

## 2019-03-12 ENCOUNTER — Inpatient Hospital Stay: Payer: Medicare Other | Admitting: Anesthesiology

## 2019-03-12 ENCOUNTER — Encounter: Payer: Self-pay | Admitting: *Deleted

## 2019-03-12 ENCOUNTER — Encounter
Admission: EM | Disposition: A | Discharge status: Skilled Nursing Facility | Payer: Self-pay | Source: Home / Self Care | Attending: Internal Medicine

## 2019-03-12 DIAGNOSIS — L8990 Pressure ulcer of unspecified site, unspecified stage: Secondary | ICD-10-CM

## 2019-03-12 DIAGNOSIS — I70262 Atherosclerosis of native arteries of extremities with gangrene, left leg: Secondary | ICD-10-CM

## 2019-03-12 HISTORY — PX: EMBOLECTOMY: SHX44

## 2019-03-12 HISTORY — PX: ENDARTERECTOMY FEMORAL: SHX5804

## 2019-03-12 LAB — CBC
HCT: 26.2 % — ABNORMAL LOW (ref 39.0–52.0)
HCT: 25.2 % — ABNORMAL LOW (ref 39.0–52.0)
Hemoglobin: 8.5 g/dL — ABNORMAL LOW (ref 13.0–17.0)
Hemoglobin: 8.3 g/dL — ABNORMAL LOW (ref 13.0–17.0)
MCH: 28.5 pg (ref 26.0–34.0)
MCH: 28.8 pg (ref 26.0–34.0)
MCHC: 32.4 g/dL (ref 30.0–36.0)
MCHC: 32.9 g/dL (ref 30.0–36.0)
MCV: 87.9 fL (ref 80.0–100.0)
MCV: 87.5 fL (ref 80.0–100.0)
Platelets: 656 10*3/uL — ABNORMAL HIGH (ref 150–400)
Platelets: 644 10*3/uL — ABNORMAL HIGH (ref 150–400)
RBC: 2.98 MIL/uL — ABNORMAL LOW (ref 4.22–5.81)
RBC: 2.88 MIL/uL — ABNORMAL LOW (ref 4.22–5.81)
RDW: 15.3 % (ref 11.5–15.5)
RDW: 15.2 % (ref 11.5–15.5)
WBC: 18.6 10*3/uL — ABNORMAL HIGH (ref 4.0–10.5)
WBC: 23.6 10*3/uL — ABNORMAL HIGH (ref 4.0–10.5)
nRBC: 0.1 % (ref 0.0–0.2)
nRBC: 0 % (ref 0.0–0.2)

## 2019-03-12 LAB — BPAM RBC
Blood Product Expiration Date: 202005192359
Blood Product Expiration Date: 202005192359
Blood Product Expiration Date: 202006132359
Blood Product Expiration Date: 202006192359
Blood Product Expiration Date: 202006192359
ISSUE DATE / TIME: 202005190859
ISSUE DATE / TIME: 202005191309
ISSUE DATE / TIME: 202005191517
Unit Type and Rh: 5100
Unit Type and Rh: 5100
Unit Type and Rh: 5100
Unit Type and Rh: 5100
Unit Type and Rh: 5100

## 2019-03-12 LAB — TYPE AND SCREEN
ABO/RH(D): O POS
Antibody Screen: NEGATIVE
Unit division: 0
Unit division: 0
Unit division: 0
Unit division: 0
Unit division: 0

## 2019-03-12 LAB — GLUCOSE, CAPILLARY
Glucose-Capillary: 274 mg/dL — ABNORMAL HIGH (ref 70–99)
Glucose-Capillary: 266 mg/dL — ABNORMAL HIGH (ref 70–99)
Glucose-Capillary: 294 mg/dL — ABNORMAL HIGH (ref 70–99)
Glucose-Capillary: 305 mg/dL — ABNORMAL HIGH (ref 70–99)
Glucose-Capillary: 249 mg/dL — ABNORMAL HIGH (ref 70–99)
Glucose-Capillary: 202 mg/dL — ABNORMAL HIGH (ref 70–99)
Glucose-Capillary: 243 mg/dL — ABNORMAL HIGH (ref 70–99)

## 2019-03-12 LAB — MAGNESIUM: Magnesium: 2.2 mg/dL (ref 1.7–2.4)

## 2019-03-12 LAB — PROTIME-INR
INR: 1.2 (ref 0.8–1.2)
Prothrombin Time: 15.2 seconds (ref 11.4–15.2)

## 2019-03-12 LAB — PREPARE RBC (CROSSMATCH)

## 2019-03-12 LAB — BASIC METABOLIC PANEL
Anion gap: 9 (ref 5–15)
BUN: 13 mg/dL (ref 8–23)
CO2: 26 mmol/L (ref 22–32)
Calcium: 7.9 mg/dL — ABNORMAL LOW (ref 8.9–10.3)
Chloride: 99 mmol/L (ref 98–111)
Creatinine, Ser: 0.69 mg/dL (ref 0.61–1.24)
GFR calc Af Amer: >60 mL/min (ref >60)
GFR calc non Af Amer: >60 mL/min (ref >60)
Glucose, Bld: 277 mg/dL — ABNORMAL HIGH (ref 70–99)
Potassium: 4.2 mmol/L (ref 3.5–5.1)
Sodium: 134 mmol/L — ABNORMAL LOW (ref 135–145)

## 2019-03-12 LAB — APTT: aPTT: 52 seconds — ABNORMAL HIGH (ref 24–36)

## 2019-03-12 SURGERY — ENDARTERECTOMY, FEMORAL
Anesthesia: General | Laterality: Left

## 2019-03-12 MED ORDER — SODIUM CHLORIDE 0.9 % IV SOLN
INTRAVENOUS | Status: DC
  Administered 2019-03-13 – 2019-03-17 (×6): via INTRAVENOUS

## 2019-03-12 MED ORDER — MIDAZOLAM HCL 2 MG/2ML IJ SOLN
INTRAMUSCULAR | Status: DC | PRN
  Administered 2019-03-12: 1 mg via INTRAVENOUS

## 2019-03-12 MED ORDER — PROPOFOL 10 MG/ML IV BOLUS
INTRAVENOUS | Status: DC | PRN
  Administered 2019-03-12: 70 mg via INTRAVENOUS

## 2019-03-12 MED ORDER — SODIUM CHLORIDE 0.9 % IV SOLN
INTRAVENOUS | Status: DC | PRN
  Administered 2019-03-12: 40 ug/min via INTRAVENOUS

## 2019-03-12 MED ORDER — MIDAZOLAM HCL 2 MG/2ML IJ SOLN
INTRAMUSCULAR | Status: AC
  Filled 2019-03-12: qty 2

## 2019-03-12 MED ORDER — CEFAZOLIN SODIUM-DEXTROSE 1-4 GM/50ML-% IV SOLN
INTRAVENOUS | Status: DC | PRN
  Administered 2019-03-12: 1 g via INTRAVENOUS

## 2019-03-12 MED ORDER — EVICEL 2 ML EX KIT
PACK | CUTANEOUS | Status: AC
  Filled 2019-03-12: qty 1

## 2019-03-12 MED ORDER — DEXAMETHASONE SODIUM PHOSPHATE 10 MG/ML IJ SOLN
INTRAMUSCULAR | Status: DC | PRN
  Administered 2019-03-12: 10 mg via INTRAVENOUS

## 2019-03-12 MED ORDER — HYDROMORPHONE HCL 1 MG/ML IJ SOLN
1.0000 mg | INTRAMUSCULAR | Status: DC | PRN
  Administered 2019-03-12 – 2019-03-16 (×14): 1 mg via INTRAVENOUS
  Filled 2019-03-12 (×15): qty 1

## 2019-03-12 MED ORDER — FENTANYL CITRATE (PF) 100 MCG/2ML IJ SOLN
INTRAMUSCULAR | Status: DC | PRN
  Administered 2019-03-12 (×2): 50 ug via INTRAVENOUS

## 2019-03-12 MED ORDER — INSULIN ASPART 100 UNIT/ML ~~LOC~~ SOLN
SUBCUTANEOUS | Status: AC
  Filled 2019-03-12: qty 1

## 2019-03-12 MED ORDER — HEMOSTATIC AGENTS (NO CHARGE) OPTIME
TOPICAL | Status: DC | PRN
  Administered 2019-03-12: 1

## 2019-03-12 MED ORDER — INSULIN GLARGINE 100 UNIT/ML ~~LOC~~ SOLN
16.0000 [IU] | Freq: Once | SUBCUTANEOUS | Status: AC
  Administered 2019-03-12: 13:00:00 16 [IU] via SUBCUTANEOUS
  Filled 2019-03-12: qty 0.16

## 2019-03-12 MED ORDER — INSULIN ASPART 100 UNIT/ML ~~LOC~~ SOLN
5.0000 [IU] | Freq: Once | SUBCUTANEOUS | Status: AC
  Administered 2019-03-12: 5 [IU] via SUBCUTANEOUS

## 2019-03-12 MED ORDER — LIDOCAINE HCL (CARDIAC) PF 100 MG/5ML IV SOSY
PREFILLED_SYRINGE | INTRAVENOUS | Status: DC | PRN
  Administered 2019-03-12: 100 mg via INTRAVENOUS

## 2019-03-12 MED ORDER — KETOROLAC TROMETHAMINE 15 MG/ML IJ SOLN
15.0000 mg | Freq: Three times a day (TID) | INTRAMUSCULAR | Status: DC
  Administered 2019-03-12 – 2019-03-13 (×4): 15 mg via INTRAVENOUS
  Filled 2019-03-12 (×5): qty 1

## 2019-03-12 MED ORDER — ACETAMINOPHEN 10 MG/ML IV SOLN
INTRAVENOUS | Status: DC | PRN
  Administered 2019-03-12: 1000 mg via INTRAVENOUS

## 2019-03-12 MED ORDER — HYDROMORPHONE HCL 1 MG/ML IJ SOLN: 1.0000 mg | INTRAMUSCULAR | Status: DC | PRN

## 2019-03-12 MED ORDER — OXYCODONE-ACETAMINOPHEN 5-325 MG PO TABS
1.0000 | ORAL_TABLET | ORAL | Status: DC | PRN
  Administered 2019-03-14: 1 via ORAL
  Administered 2019-03-15 – 2019-03-16 (×4): 2 via ORAL
  Filled 2019-03-12: qty 1
  Filled 2019-03-12 (×4): qty 2
  Filled 2019-03-12: qty 1

## 2019-03-12 MED ORDER — HEPARIN SODIUM (PORCINE) 1000 UNIT/ML IJ SOLN
INTRAMUSCULAR | Status: DC | PRN
  Administered 2019-03-12: 5000 [IU] via INTRAVENOUS

## 2019-03-12 MED ORDER — ROCURONIUM BROMIDE 100 MG/10ML IV SOLN
INTRAVENOUS | Status: DC | PRN
  Administered 2019-03-12: 20 mg via INTRAVENOUS
  Administered 2019-03-12: 10 mg via INTRAVENOUS
  Administered 2019-03-12: 40 mg via INTRAVENOUS

## 2019-03-12 MED ORDER — FENTANYL CITRATE (PF) 100 MCG/2ML IJ SOLN: 25.0000 ug | INTRAMUSCULAR | Status: DC | PRN

## 2019-03-12 MED ORDER — ONDANSETRON HCL 4 MG/2ML IJ SOLN: 4.0000 mg | Freq: Once | INTRAMUSCULAR | Status: DC | PRN

## 2019-03-12 MED ORDER — FENTANYL CITRATE (PF) 100 MCG/2ML IJ SOLN
INTRAMUSCULAR | Status: AC
  Filled 2019-03-12: qty 2

## 2019-03-12 MED ORDER — SUCCINYLCHOLINE CHLORIDE 20 MG/ML IJ SOLN
INTRAMUSCULAR | Status: DC | PRN
  Administered 2019-03-12: 100 mg via INTRAVENOUS

## 2019-03-12 MED ORDER — INSULIN GLARGINE 100 UNIT/ML ~~LOC~~ SOLN
19.0000 [IU] | Freq: Every day | SUBCUTANEOUS | Status: DC
  Administered 2019-03-13 – 2019-03-19 (×7): 19 [IU] via SUBCUTANEOUS
  Filled 2019-03-12 (×7): qty 0.19

## 2019-03-12 MED ORDER — PROPOFOL 10 MG/ML IV BOLUS
INTRAVENOUS | Status: AC
  Filled 2019-03-12: qty 20

## 2019-03-12 MED ORDER — SUGAMMADEX SODIUM 200 MG/2ML IV SOLN
INTRAVENOUS | Status: DC | PRN
  Administered 2019-03-12: 114.6 mg via INTRAVENOUS

## 2019-03-12 MED ORDER — SODIUM CHLORIDE 1 G PO TABS
2.0000 g | ORAL_TABLET | Freq: Once | ORAL | Status: AC
  Administered 2019-03-12: 17:00:00 2 g via ORAL
  Filled 2019-03-12 (×2): qty 2

## 2019-03-12 MED ORDER — EVICEL 2 ML EX KIT
PACK | CUTANEOUS | Status: DC | PRN
  Administered 2019-03-12: 2 mL

## 2019-03-12 SURGICAL SUPPLY — 68 items
APPLIER CLIP 11 MED OPEN (CLIP)
APPLIER CLIP 9.375 SM OPEN (CLIP)
BAG COUNTER SPONGE EZ (MISCELLANEOUS) ×1 IMPLANT
BAG DECANTER FOR FLEXI CONT (MISCELLANEOUS) ×3 IMPLANT
BAG ISOLATATION DRAPE 20X20 ST (DRAPES) IMPLANT
BLADE SURG 15 STRL LF DISP TIS (BLADE) ×1 IMPLANT
BLADE SURG 15 STRL SS (BLADE) ×2
BLADE SURG SZ11 CARB STEEL (BLADE) ×3 IMPLANT
BOOT SUTURE AID YELLOW STND (SUTURE) ×3 IMPLANT
BRUSH SCRUB EZ  4% CHG (MISCELLANEOUS) ×2
BRUSH SCRUB EZ 4% CHG (MISCELLANEOUS) ×1 IMPLANT
CANISTER SUCT 1200ML W/VALVE (MISCELLANEOUS) ×3 IMPLANT
CATH EMBOLECTOMY 3X80 (CATHETERS) ×2 IMPLANT
CHLORAPREP W/TINT 26 (MISCELLANEOUS) ×3 IMPLANT
CLIP APPLIE 11 MED OPEN (CLIP) IMPLANT
CLIP APPLIE 9.375 SM OPEN (CLIP) IMPLANT
COUNTER SPONGE BAG EZ (MISCELLANEOUS)
COVER WAND RF STERILE (DRAPES) ×3 IMPLANT
DERMABOND ADVANCED (GAUZE/BANDAGES/DRESSINGS) ×2
DERMABOND ADVANCED .7 DNX12 (GAUZE/BANDAGES/DRESSINGS) ×1 IMPLANT
DRAPE INCISE IOBAN 66X45 STRL (DRAPES) ×3 IMPLANT
DRAPE ISOLATE BAG 20X20 STRL (DRAPES)
DRAPE SHEET LG 3/4 BI-LAMINATE (DRAPES) ×2 IMPLANT
ELECT CAUTERY BLADE 6.4 (BLADE) ×3 IMPLANT
ELECT REM PT RETURN 9FT ADLT (ELECTROSURGICAL) ×3
ELECTRODE REM PT RTRN 9FT ADLT (ELECTROSURGICAL) ×1 IMPLANT
GLOVE BIO SURGEON STRL SZ7 (GLOVE) ×6 IMPLANT
GLOVE INDICATOR 7.5 STRL GRN (GLOVE) ×3 IMPLANT
GOWN STRL REUS W/ TWL LRG LVL3 (GOWN DISPOSABLE) ×1 IMPLANT
GOWN STRL REUS W/ TWL XL LVL3 (GOWN DISPOSABLE) ×2 IMPLANT
GOWN STRL REUS W/TWL LRG LVL3 (GOWN DISPOSABLE) ×2
GOWN STRL REUS W/TWL XL LVL3 (GOWN DISPOSABLE) ×4
HEMOSTAT SURGICEL 2X3 (HEMOSTASIS) ×3 IMPLANT
IV NS 500ML (IV SOLUTION) ×2
IV NS 500ML BAXH (IV SOLUTION) ×1 IMPLANT
KIT PREVENA INCISION MGT 13 (CANNISTER) ×2 IMPLANT
KIT TURNOVER KIT A (KITS) ×3 IMPLANT
LABEL OR SOLS (LABEL) ×3 IMPLANT
LOOP RED MAXI  1X406MM (MISCELLANEOUS) ×4
LOOP VESSEL MAXI 1X406 RED (MISCELLANEOUS) ×2 IMPLANT
LOOP VESSEL MINI 0.8X406 BLUE (MISCELLANEOUS) ×2 IMPLANT
LOOPS BLUE MINI 0.8X406MM (MISCELLANEOUS) ×2
NDL SAFETY ECLIPSE 18X1.5 (NEEDLE) ×1 IMPLANT
NEEDLE HYPO 18GX1.5 SHARP (NEEDLE) ×2
NS IRRIG 500ML POUR BTL (IV SOLUTION) ×3 IMPLANT
PACK BASIN MAJOR ARMC (MISCELLANEOUS) ×3 IMPLANT
PACK UNIVERSAL (MISCELLANEOUS) ×3 IMPLANT
PATCH CAROTID ECM VASC 1X10 (Prosthesis & Implant Heart) ×2 IMPLANT
SUT MNCRL 4-0 (SUTURE) ×2
SUT MNCRL 4-0 27XMFL (SUTURE) ×1
SUT PROLENE 5 0 RB 1 DA (SUTURE) ×6 IMPLANT
SUT PROLENE 6 0 BV (SUTURE) ×16 IMPLANT
SUT PROLENE 7 0 BV 1 (SUTURE) ×6 IMPLANT
SUT SILK 2 0 (SUTURE) ×2
SUT SILK 2-0 18XBRD TIE 12 (SUTURE) ×1 IMPLANT
SUT SILK 3 0 (SUTURE) ×2
SUT SILK 3-0 18XBRD TIE 12 (SUTURE) ×1 IMPLANT
SUT SILK 4 0 (SUTURE) ×2
SUT SILK 4-0 18XBRD TIE 12 (SUTURE) ×1 IMPLANT
SUT VIC AB 2-0 CT1 27 (SUTURE) ×2
SUT VIC AB 2-0 CT1 TAPERPNT 27 (SUTURE) ×2 IMPLANT
SUT VIC AB 3-0 SH 27 (SUTURE) ×2
SUT VIC AB 3-0 SH 27X BRD (SUTURE) ×1 IMPLANT
SUT VICRYL+ 3-0 36IN CT-1 (SUTURE) ×6 IMPLANT
SUTURE MNCRL 4-0 27XMF (SUTURE) ×1 IMPLANT
SYR 20CC LL (SYRINGE) ×3 IMPLANT
SYR 5ML LL (SYRINGE) ×3 IMPLANT
TRAY FOLEY MTR SLVR 16FR STAT (SET/KITS/TRAYS/PACK) ×3 IMPLANT

## 2019-03-12 NOTE — Progress Notes (Signed)
PT Cancellation Note  Patient Details Name: Richard [name] MRN: [id number] DOB: 1956-06-05   Cancelled Treatment:    Reason Eval/Treat Not Completed: Patient not medically ready Pt with revascualarization this date (5/21) BKA scheduled for tomorrow.  Will hold PT exam until pt is stable post amputation.  Spoke with nursing who agrees that initiation of PT is not currently necessary.    Kreg Shropshire, DPT 03/12/2019, 3:36 PM

## 2019-03-12 NOTE — Progress Notes (Signed)
Inpatient Diabetes Program Recommendations  AACE/ADA: New Consensus Statement on Inpatient Glycemic Control   Target Ranges:  Prepandial:   less than 140 mg/dL      Peak postprandial:   less than 180 mg/dL (1-2 hours)      Critically ill patients:  140 - 180 mg/dL  Results for RILAN, EILAND (MRN [id number]) as of 03/12/2019 10:08  Ref. Range 03/11/2019 05:04 03/11/2019 08:01 03/11/2019 16:46 03/11/2019 21:57 03/12/2019 02:40  Glucose-Capillary Latest Ref Range: 70 - 99 mg/dL 304 (H) 251 (H) 200 (H) 217 (H) 274 (H)    Review of Glycemic Control  Outpatient Diabetes medications: Levemir 17 units daily, Novolog 5 units TID with meals, Novolog 0-5 units QHS Current orders for Inpatient glycemic control:Lantus16units daily, Novolog 0-9units TID with meals, Novolog 0-5 units QHS, Novolog 10 units TID with meals   Inpatient Diabetes Program Recommendations:   Insulin - Basal: Please consider increasing Lantus to 19 units daily.  Thanks, Barnie Alderman, RN, MSN, CDE Diabetes Coordinator Inpatient Diabetes Program [phone] (Team Pager from 8am to 5pm)

## 2019-03-12 NOTE — Anesthesia Procedure Notes (Signed)
Procedure Name: Intubation Date/Time: 03/12/2019 8:32 AM Performed by: Nelda Marseille, CRNA Pre-anesthesia Checklist: Patient identified, Patient being monitored, Timeout performed, Emergency Drugs available and Suction available Patient Re-evaluated:Patient Re-evaluated prior to induction Oxygen Delivery Method: Circle system utilized Preoxygenation: Pre-oxygenation with 100% oxygen Induction Type: IV induction Ventilation: Mask ventilation without difficulty Laryngoscope Size: Mac, 3 and McGraph Grade View: Grade I Tube type: Oral Tube size: 7.5 mm Number of attempts: 1 Airway Equipment and Method: Stylet Placement Confirmation: ETT inserted through vocal cords under direct vision,  positive ETCO2 and breath sounds checked- equal and bilateral Secured at: 21 cm Tube secured with: Tape Dental Injury: Teeth and Oropharynx as per pre-operative assessment

## 2019-03-12 NOTE — Transfer of Care (Signed)
Immediate Anesthesia Transfer of Care Note  Patient: Richard [name]  Procedure(s) Performed: ENDARTERECTOMY FEMORAL (Left ) EMBOLECTOMY SFA POPLITEAL (Left )  Patient Location: PACU  Anesthesia Type:General  Level of Consciousness: sedated  Airway & Oxygen Therapy: Patient Spontanous Breathing and Patient connected to face mask oxygen  Post-op Assessment: Report given to RN and Post -op Vital signs reviewed and stable  Post vital signs: Reviewed and stable  Last Vitals:  Vitals Value Taken Time  BP 144/84 03/12/2019 10:26 AM  Temp    Pulse 91 03/12/2019 10:26 AM  Resp 20 03/12/2019 10:26 AM  SpO2 100 % 03/12/2019 10:26 AM  Vitals shown include unvalidated device data.  Last Pain:  Vitals:   03/12/19 0656  TempSrc: Temporal  PainSc: 2       Patients Stated Pain Goal: 0 (83/72/90 2111)  Complications: No apparent anesthesia complications

## 2019-03-12 NOTE — Anesthesia Preprocedure Evaluation (Addendum)
Anesthesia Evaluation  Patient identified by MRN, date of birth, ID band Patient awake    Reviewed: Allergy & Precautions, NPO status , Patient's Chart, lab work & pertinent test results  Airway Mallampati: II       Dental  (+) Poor Dentition   Pulmonary COPD, Current Smoker, former smoker,     + decreased breath sounds      Cardiovascular Exercise Tolerance: Poor hypertension, Pt. on medications + CAD and +CHF  + dysrhythmias Atrial Fibrillation  Rhythm:Regular     Neuro/Psych negative neurological ROS     GI/Hepatic Neg liver ROS, Pancreatic ca   Endo/Other  diabetes, Poorly Controlled, Type 1, Insulin Dependent  Renal/GU      Musculoskeletal   Abdominal Normal abdominal exam  (+)   Peds negative pediatric ROS (+)  Hematology negative hematology ROS (+)   Anesthesia Other Findings   Reproductive/Obstetrics                             Anesthesia Physical  Anesthesia Plan  ASA: III  Anesthesia Plan: General   Post-op Pain Management:    Induction: Intravenous  PONV Risk Score and Plan: 0  Airway Management Planned: Oral ETT  Additional Equipment:   Intra-op Plan:   Post-operative Plan: Extubation in OR  Informed Consent: I have reviewed the patients History and Physical, chart, labs and discussed the procedure including the risks, benefits and alternatives for the proposed anesthesia with the patient or authorized representative who has indicated his/her understanding and acceptance.     Dental advisory given  Plan Discussed with: Surgeon and CRNA  Anesthesia Plan Comments:         Anesthesia Quick Evaluation

## 2019-03-12 NOTE — Progress Notes (Signed)
Patient ID: Richard [name], male   DOB: 06-15-1956, 63 y.o.   MRN: [id number]   [facility] PROGRESS NOTE  Avari Gelles IDC:[id number] DOB: 1956/06/17 DOA: 02/28/2019 PCP: [facility] [place]  HPI/Subjective: Patient has no complaints.  Saw the patient in PACU.  He is a s/p neurectomy of left femoral artery.  Blood sugar is high at 305. Objective: Vitals:   03/12/19 1111 03/12/19 1149  BP: 134/65 109/78  Pulse: 95 91  Resp: 17   Temp: 98.8 F (37.1 C)   SpO2: 98%     Filed Weights   02/28/19 1443 03/01/19 0416 03/04/19 1248  Weight: 55 kg 57.3 kg 57.3 kg    ROS: Review of Systems  Constitutional: Negative for chills and fever.  Eyes: Negative for blurred vision.  Respiratory: Negative for cough and shortness of breath.   Cardiovascular: Negative for chest pain.  Gastrointestinal: Negative for abdominal pain, constipation, diarrhea, nausea and vomiting.  Genitourinary: Negative for dysuria.  Musculoskeletal: Negative for joint pain.  Neurological: Negative for dizziness and headaches.   Exam: Physical Exam  HENT:  Nose: No mucosal edema.  Mouth/Throat: No oropharyngeal exudate or posterior oropharyngeal edema.  Eyes: Pupils are equal, round, and reactive to light. Conjunctivae and lids are normal.  Neck: No JVD present. Carotid bruit is not present. No edema present. No thyroid mass and no thyromegaly present.  Cardiovascular: S1 normal and S2 normal. Exam reveals no gallop.  No murmur heard. Respiratory: No respiratory distress. He has no wheezes. He has no rhonchi. He has no rales.  GI: Soft. Bowel sounds are normal. There is no abdominal tenderness.  Musculoskeletal:     Right ankle: He exhibits no swelling.     Left ankle: He exhibits no swelling.     Comments: Gangrenous left first and second toe  Lymphadenopathy:    He has no cervical adenopathy.  Neurological: He is alert.  Moves all extremities on his own  Skin: Skin is warm. Nails show no  clubbing.  Left first and second toe gangrenous.  Demarcation starting to go up the left foot now.  Has some blistering on the left foot.  Prior amputation of the right trans-met. 2 stage II decubiti noninfected on the buttock sacral area.  Psychiatric: He has a normal mood and affect.      Data Reviewed: Basic Metabolic Panel: Recent Labs  Lab 03/09/19 0422 03/10/19 0543 03/11/19 0513 03/12/19 0437  NA 137 135 130* 134*  K 4.4 4.5 4.1 4.2  CL 101 99 95* 99  CO2 _0 GLUCOSE 220* 325* 280* 277*  BUN _1 CREATININE 0.57* 0.64 0.83 0.69  CALCIUM 7.8* 7.9* 7.8* 7.9*  MG  --  2.1 2.2 2.2  PHOS  --  3.2  --   --    Liver Function Tests: Recent Labs  Lab 03/10/19 0543  AST 14*  ALT 8  ALKPHOS 35*  BILITOT 0.5  PROT 6.4*  ALBUMIN 2.0*   No results for input(s): LIPASE, AMYLASE in the last 168 hours. CBC: Recent Labs  Lab 03/08/19 0422 03/09/19 0422 03/10/19 0543 03/10/19 2023 03/11/19 0513 03/12/19 0437  WBC 15.8* 17.8* 20.2*  --  21.5* 18.6*  NEUTROABS  --  12.9* 16.0*  --   --   --   HGB 7.4* 7.1* 7.2* 8.5* 8.6* 8.5*  HCT 23.1* 22.3* 22.3* 25.8* 26.0* 26.2*  MCV 87.2 87.1 88.1  --  86.7 87.9  PLT 384  469* 550*  --  585* 656*   Cardiac Enzymes: No results for input(s): CKTOTAL, CKMB, CKMBINDEX, TROPONINI in the last 168 hours.  CBG: Recent Labs  Lab 03/11/19 2157 03/12/19 0240 03/12/19 0655 03/12/19 1037 03/12/19 1133  GLUCAP 217* 274* 266* 294* 305*    Recent Results (from the past 240 hour(s))  CULTURE, BLOOD (ROUTINE X 2) w Reflex to ID Panel     Status: None   Collection Time: 03/05/19  9:56 AM  Result Value Ref Range Status   Specimen Description BLOOD LEFT HAND  Final   Special Requests   Final    BOTTLES DRAWN AEROBIC AND ANAEROBIC Blood Culture results may not be optimal due to an excessive volume of blood received in culture bottles   Culture   Final    NO GROWTH 5 DAYS Performed at [facility], [address] [place], [place] [address]    Report Status 03/10/2019 FINAL  Final  CULTURE, BLOOD (ROUTINE X 2) w Reflex to ID Panel     Status: None   Collection Time: 03/05/19  9:56 AM  Result Value Ref Range Status   Specimen Description BLOOD RIGHT FA  Final   Special Requests   Final    BOTTLES DRAWN AEROBIC AND ANAEROBIC Blood Culture adequate volume   Culture   Final    NO GROWTH 5 DAYS Performed at [facility], [address] [place], [place] [address]    Report Status 03/10/2019 FINAL  Final  CULTURE, BLOOD (ROUTINE X 2) w Reflex to ID Panel     Status: None (Preliminary result)   Collection Time: 03/09/19 12:55 PM  Result Value Ref Range Status   Specimen Description BLOOD LEFT ARM  Final   Special Requests   Final    BOTTLES DRAWN AEROBIC AND ANAEROBIC Blood Culture adequate volume   Culture   Final    NO GROWTH 3 DAYS Performed at [facility], [address], [place], [place] [address]    Report Status PENDING  Incomplete  CULTURE, BLOOD (ROUTINE X 2) w Reflex to ID Panel     Status: None (Preliminary result)   Collection Time: 03/09/19  1:00 PM  Result Value Ref Range Status   Specimen Description BLOOD LEFT HAND  Final   Special Requests   Final    BOTTLES DRAWN AEROBIC AND ANAEROBIC Blood Culture adequate volume   Culture   Final    NO GROWTH 3 DAYS Performed at [facility], [address], [place], [place] [address]    Report Status PENDING  Incomplete  Urine Culture     Status: None   Collection Time: 03/09/19  1:23 PM  Result Value Ref Range Status   Specimen Description   Final    URINE, CLEAN CATCH Performed at [facility], [address] [place], [place] [address]    Special Requests   Final    NONE Performed at [facility], [address] [place], [place] [address]    Culture   Final    NO GROWTH Performed at [facility], [address] [place], [place] [address]    Report Status  03/10/2019 FINAL  Final     Studies: No results found.  Scheduled Meds: . aspirin  81 mg Oral Daily  . enoxaparin (LOVENOX) injection  40 mg Subcutaneous Q24H  . insulin aspart      . insulin aspart  0-5 Units Subcutaneous QHS  . insulin  aspart  0-9 Units Subcutaneous TID WC  . insulin aspart  10 Units Subcutaneous TID WC  . insulin glargine  16 Units Subcutaneous Once  . [START ON 03/13/2019] insulin glargine  19 Units Subcutaneous Daily  . ketorolac  15 mg Intravenous Q8H  . mouth rinse  15 mL Mouth Rinse BID  . Ensure Max Protein  11 oz Oral BID BM  . rosuvastatin  40 mg Oral q1800  . sodium chloride flush  10 mL Intravenous Q12H  . sodium chloride  2 g Oral Once   Continuous Infusions: . [START ON 03/13/2019] sodium chloride    . piperacillin-tazobactam (ZOSYN)  IV Stopped (03/12/19 0413)    Assessment/Plan:  1.  Sepsis with Proteus vulgaris bacteremia.  Likely the gangrenous toes as the source.  Demarcation starting to go up to the left foot.  Patient with low-grade temperature.  Repeat blood cultures so far negative.  Patient on Rocephin.  Patient has poor circulation and would need a revascularization procedure prior to amputation in order for it to heal.  Patient prefers surgery at this time. Changed from Rocephin to Zosyn per Dr. Arlyss Repress. Better leukocytosis, repeated blood culture is negative so far, follow up CBC.    2.  Severe peripheral vascular disease.  Angiogram showing poor blood supply to the lower extremities. S/P L CFA endarterectomy today, amputation tomorrow per Dr. Lucky Cowboy.  3.  Acute hypoxic respiratory failure.  Patient tapered off oxygen. 4.  Acute myocardial infarction.  Conservative management as per cardiology.  Patient on aspirin.  Hold off on Brilinta because the patient may need a amputation.  High-dose Crestor started. 5.  Hyperglycemia due to type 2 diabetes mellitus.   Increase Lantus to 19 units with sliding scale. Added novolog 10 unit AC. 6.   Acute kidney injury normalized with IV fluids. 7.  Cocaine abuse  8.  Chronic atrial fibrillation.  We will have to discuss anticoagulation after procedure. 9.  History of pancreatic cancer status post Whipple procedure in 2018 10.  Hypernatremia.  This has normalized. 11.  Anemia of chronic disease.  Patient has received 2 units of packed red blood cells. Hb down to 7.2. no active bleeding.  Up to 8.6 after 1 unit PRBC transfusion,  follow-up hemoglobin 8.5.Marland Kitchen 12.  2 stage II decubitus on buttocks.  Local wound care.  Code Status:     Code Status Orders  (From admission, onward)         Start     Ordered   03/02/19 1454  Full code  Continuous     03/02/19 1454        Code Status History    Date Active Date Inactive Code Status Order ID Comments User Context   02/28/2019 1449 03/01/2019 0836 Full Code [id number]  Gorden Harms, MD Inpatient   02/28/2019 1449 02/28/2019 1449 Full Code [id number]  Gorden Harms, MD Inpatient   11/11/2018 0758 11/18/2018 1814 DNR [id number]  Loletha Grayer, MD ED   09/20/2018 1635 09/24/2018 1854 Full Code [id number]  Fritzi Mandes, MD ED   08/03/2018 1020 08/04/2018 1813 Full Code [id number]  Salary, Avel Peace, MD ED   01/09/2018 1025 01/11/2018 1932 Full Code [id number]  Harrie Foreman, MD Inpatient   09/05/2017 1559 09/11/2017 1928 Full Code [id number]  Fritzi Mandes, MD Inpatient   08/24/2017 0238 08/29/2017 2131 Full Code [id number]  Lance Coon, MD Inpatient   08/10/2015 0822 08/11/2015 1947 Full Code [id number]  Harrie Foreman, MD Inpatient  Disposition Plan: TBD  Consultants:  Critical care specialist  Cardiology  Palliative care  Vascular surgery  Podiatry  Antibiotics:  Rocephin  Time spent: 27 minutes.  Spoke with patient's significant other Ms. Lewis on the phone. [name]  [employer]

## 2019-03-12 NOTE — Anesthesia Post-op Follow-up Note (Signed)
Anesthesia QCDR form completed.        

## 2019-03-12 NOTE — Progress Notes (Signed)
Report called to [place], consent signed, patient in hospital gown.  Stacie Glaze, RN

## 2019-03-12 NOTE — Progress Notes (Signed)
Returned from OR, VSS. Madlyn Frankel, RN

## 2019-03-12 NOTE — Op Note (Signed)
OPERATIVE NOTE   PROCEDURE: 1. Left common femoral and profunda femoris endarterectomies and patch angioplasty 2.  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 significant plaque in left common femoral and profunda femoris 2.  Thrombosis of the left SFA and popliteal stents  SPECIMEN(S): Left common femoral and profunda femoris plaque Left SFA and popliteal thrombus  INDICATIONS:  Patient presents with a gangrenous left foot.  Left femoral endarterectomy is planned to try to improve perfusion in hopes of at least salvaging a below-knee amputation.  He has had previous percutaneous intervention which was extensive but he had extremely poor runoff in the tibial vessels and he also had flow-limiting issues of plaque and thrombus in the femoral bifurcation area.  Without femoral endarterectomy, he would require an above-knee amputation more than likely. The risks and benefits as well as alternative therapies including intervention were reviewed in detail all questions were answered the patient agrees to proceed with surgery. An assistant was present during the procedure to help facilitate the exposure and expedite the procedure.  DESCRIPTION: After obtaining full informed written consent, the patient was brought back to the operating room and placed supine upon the operating table. The patient received IV antibiotics prior to induction. After obtaining adequate anesthesia, the patient was prepped and draped in the standard fashion appropriate time out is called. The assistant provided retraction and mobilization to help facilitate exposure and expedite the procedure throughout  the entire procedure.  This included following suture, using retractors, and optimizing lighting.  Vertical incision was created overlying the left femoral arteries. The common femoral artery proximally, and superficial femoral artery, and primary profunda femoris artery branches were encircled with vessel loops and prepared for control. The left femoral arteries were found to have significant plaque from the common femoral artery into the profunda and superficial femoral arteries.  Of note, there were multiple large soft lymph nodes in the groin.  Several of these had to be removed to facilitate the dissection and they were sent off.  They did not feel pathologic or hard in any way and were likely a reactive process due to his gangrenous foot.   5000 units of heparin was given and allowed circulate for 5 minutes.   Attention is then turned to the left femoral artery. An arteriotomy is made with 11 blade and extended with Potts scissors in the common femoral artery and carried down onto the first 2-3 cm of the profunda femorus artery.  On opening the artery, there was dense plaque in the common femoral and proximal profunda femoris artery.  The stent in the superficial femoral artery was full of thrombus which was a separate distinct tissue from the calcific plaque in the common and profunda femoris artery.  An endarterectomy was then performed. The [facility] was used to create a plane. The proximal endpoint was cut flush with tenotomy scissors. This was in the proximal common femoral artery.  Attention is then turned to the thrombus in the SFA and popliteal arteries.  I elected to manage this with a Fogarty embolectomy.  A 3 Fogarty embolectomy balloon was then passed down the superficial femoral artery down to the popliteal artery and likely into the proximal anterior tibial artery as the  catheter passed approximately 50 to 55 cm.  A very large volume of thrombus returned with the first pass but  the backbleeding was still extremely sluggish.  3 more passes with the 3 Fogarty embolectomy balloon were performed with scant return of any further thrombus after the second pass and residual poor backbleeding.  At this point, I felt we had done all we could do to remove the thrombotic process from the SFA and popliteal arteries.  Attention was turned back to the profunda femoris artery.  The distal endpoint of the profunda femoris endarterectomy was created with gentle traction and the distal endpoint was tacked down with three 7-0 Prolene sutures for a nice distal endpoint.  This was at a trifurcation of the profunda femoris artery.  There is also a more medial and posterior profunda femoris branch which had a separate origin and had reasonable backbleeding. The Cormatrix patcth is then selected and prepared for a patch angioplasty.  It is cut and beveled and started at the proximal endpoint with a 6-0 Prolene suture.  Approximately one half of the suture line is run medially and laterally and the distal end point was cut and bevelled to match the arteriotomy.  A second 6-0 Prolene was started at the distal end point and run to the mid portion to complete the arteriotomy.  The vessel was flushed prior to release of control and completion of the anastomosis.  At this point, flow was established first to the profunda femoris artery and then to the superficial femoral artery. Easily palpable pulses are noted well beyond the anastomosis and both arteries.  Surgicel and Evicel topical hemostatic agents were placed in the femoral incision and hemostasis was complete. The femoral incision was then closed in a layered fashion with 2 layers of 2-0 Vicryl, 2 layers of 3-0 Vicryl, and 4-0 Monocryl for the skin closure.  An incisional VAC was placed due to the gangrenous foot, as well as the large lymph nodes that were seen on dissection.    The patient was then awakened from anesthesia and taken to the recovery room  in stable condition having tolerated the procedure well.  COMPLICATIONS: None  CONDITION: Stable     Leotis Pain 03/12/2019 10:05 AM   This note was created with Dragon Medical transcription system. Any errors in dictation are purely unintentional.

## 2019-03-12 NOTE — H&P (Signed)
 [facility] History & Physical Update  The patient was interviewed and re-examined.  The patient's previous History and Physical has been reviewed and is unchanged.  There is no change in the plan of care. We plan to proceed with the scheduled procedure.  Leotis Pain, MD  03/12/2019, 7:30 AM

## 2019-03-13 ENCOUNTER — Encounter
Admission: EM | Disposition: A | Discharge status: Skilled Nursing Facility | Payer: Self-pay | Source: Home / Self Care | Attending: Internal Medicine

## 2019-03-13 ENCOUNTER — Inpatient Hospital Stay: Payer: Medicare Other | Admitting: Anesthesiology

## 2019-03-13 DIAGNOSIS — Z89512 Acquired absence of left leg below knee: Secondary | ICD-10-CM

## 2019-03-13 DIAGNOSIS — I96 Gangrene, not elsewhere classified: Secondary | ICD-10-CM

## 2019-03-13 HISTORY — PX: AMPUTATION: SHX166

## 2019-03-13 LAB — BASIC METABOLIC PANEL
Anion gap: 8 (ref 5–15)
BUN: 25 mg/dL — ABNORMAL HIGH (ref 8–23)
CO2: 27 mmol/L (ref 22–32)
Calcium: 7.8 mg/dL — ABNORMAL LOW (ref 8.9–10.3)
Chloride: 100 mmol/L (ref 98–111)
Creatinine, Ser: 0.76 mg/dL (ref 0.61–1.24)
GFR calc Af Amer: >60 mL/min (ref >60)
GFR calc non Af Amer: >60 mL/min (ref >60)
Glucose, Bld: 362 mg/dL — ABNORMAL HIGH (ref 70–99)
Potassium: 4.2 mmol/L (ref 3.5–5.1)
Sodium: 135 mmol/L (ref 135–145)

## 2019-03-13 LAB — CBC
HCT: 24.8 % — ABNORMAL LOW (ref 39.0–52.0)
Hemoglobin: 8.3 g/dL — ABNORMAL LOW (ref 13.0–17.0)
MCH: 29.1 pg (ref 26.0–34.0)
MCHC: 33.5 g/dL (ref 30.0–36.0)
MCV: 87 fL (ref 80.0–100.0)
Platelets: 667 10*3/uL — ABNORMAL HIGH (ref 150–400)
RBC: 2.85 MIL/uL — ABNORMAL LOW (ref 4.22–5.81)
RDW: 15.3 % (ref 11.5–15.5)
WBC: 33.2 10*3/uL — ABNORMAL HIGH (ref 4.0–10.5)
nRBC: 0 % (ref 0.0–0.2)

## 2019-03-13 LAB — MAGNESIUM: Magnesium: 2.3 mg/dL (ref 1.7–2.4)

## 2019-03-13 LAB — GLUCOSE, CAPILLARY
Glucose-Capillary: 157 mg/dL — ABNORMAL HIGH (ref 70–99)
Glucose-Capillary: 165 mg/dL — ABNORMAL HIGH (ref 70–99)
Glucose-Capillary: 192 mg/dL — ABNORMAL HIGH (ref 70–99)
Glucose-Capillary: 322 mg/dL — ABNORMAL HIGH (ref 70–99)
Glucose-Capillary: 330 mg/dL — ABNORMAL HIGH (ref 70–99)
Glucose-Capillary: 72 mg/dL (ref 70–99)

## 2019-03-13 LAB — SURGICAL PATHOLOGY

## 2019-03-13 SURGERY — AMPUTATION BELOW KNEE
Anesthesia: General | Site: Leg Lower | Laterality: Left

## 2019-03-13 MED ORDER — INSULIN ASPART 100 UNIT/ML ~~LOC~~ SOLN
0.0000 [IU] | Freq: Three times a day (TID) | SUBCUTANEOUS | Status: DC
  Administered 2019-03-13: 17:00:00 3 [IU] via SUBCUTANEOUS
  Administered 2019-03-13: 11 [IU] via SUBCUTANEOUS
  Administered 2019-03-14 – 2019-03-16 (×4): 3 [IU] via SUBCUTANEOUS
  Administered 2019-03-17: 5 [IU] via SUBCUTANEOUS
  Administered 2019-03-17 – 2019-03-19 (×5): 3 [IU] via SUBCUTANEOUS
  Filled 2019-03-13 (×13): qty 1

## 2019-03-13 MED ORDER — INSULIN ASPART 100 UNIT/ML ~~LOC~~ SOLN: 0.0000 [IU] | Freq: Every day | SUBCUTANEOUS | Status: DC

## 2019-03-13 MED ORDER — MIDAZOLAM HCL 2 MG/2ML IJ SOLN
INTRAMUSCULAR | Status: AC
  Filled 2019-03-13: qty 2

## 2019-03-13 MED ORDER — HYDROMORPHONE HCL 1 MG/ML IJ SOLN
0.2500 mg | INTRAMUSCULAR | Status: DC | PRN
  Administered 2019-03-13 (×3): 0.25 mg via INTRAVENOUS

## 2019-03-13 MED ORDER — LIDOCAINE HCL (PF) 2 % IJ SOLN
INTRAMUSCULAR | Status: AC
  Filled 2019-03-13: qty 10

## 2019-03-13 MED ORDER — PHENYLEPHRINE HCL (PRESSORS) 10 MG/ML IV SOLN
INTRAVENOUS | Status: AC
  Filled 2019-03-13: qty 5

## 2019-03-13 MED ORDER — ROCURONIUM BROMIDE 50 MG/5ML IV SOLN
INTRAVENOUS | Status: AC
  Filled 2019-03-13: qty 1

## 2019-03-13 MED ORDER — FENTANYL CITRATE (PF) 100 MCG/2ML IJ SOLN
INTRAMUSCULAR | Status: AC
  Filled 2019-03-13: qty 2

## 2019-03-13 MED ORDER — BUPIVACAINE LIPOSOME 1.3 % IJ SUSP
INTRAMUSCULAR | Status: AC
  Filled 2019-03-13: qty 20

## 2019-03-13 MED ORDER — MIDAZOLAM HCL 2 MG/2ML IJ SOLN
INTRAMUSCULAR | Status: DC | PRN
  Administered 2019-03-13: 1 mg via INTRAVENOUS

## 2019-03-13 MED ORDER — ONDANSETRON HCL 4 MG/2ML IJ SOLN
INTRAMUSCULAR | Status: AC
  Filled 2019-03-13: qty 2

## 2019-03-13 MED ORDER — PROPOFOL 10 MG/ML IV BOLUS
INTRAVENOUS | Status: DC | PRN
  Administered 2019-03-13: 60 mg via INTRAVENOUS

## 2019-03-13 MED ORDER — HYDROMORPHONE HCL 1 MG/ML IJ SOLN
INTRAMUSCULAR | Status: AC
  Administered 2019-03-13: 15:00:00 0.25 mg via INTRAVENOUS
  Filled 2019-03-13: qty 1

## 2019-03-13 MED ORDER — SUCCINYLCHOLINE CHLORIDE 20 MG/ML IJ SOLN
INTRAMUSCULAR | Status: DC | PRN
  Administered 2019-03-13: 60 mg via INTRAVENOUS

## 2019-03-13 MED ORDER — EPHEDRINE SULFATE 50 MG/ML IJ SOLN
INTRAMUSCULAR | Status: AC
  Filled 2019-03-13: qty 1

## 2019-03-13 MED ORDER — LIDOCAINE HCL (CARDIAC) PF 100 MG/5ML IV SOSY
PREFILLED_SYRINGE | INTRAVENOUS | Status: DC | PRN
  Administered 2019-03-13: 60 mg via INTRAVENOUS

## 2019-03-13 MED ORDER — BUPIVACAINE HCL (PF) 0.5 % IJ SOLN
INTRAMUSCULAR | Status: AC
  Filled 2019-03-13: qty 30

## 2019-03-13 MED ORDER — SODIUM CHLORIDE 0.9 % IV SOLN
INTRAVENOUS | Status: DC | PRN
  Administered 2019-03-13: 13:00:00 30 ug/min via INTRAVENOUS

## 2019-03-13 MED ORDER — TICAGRELOR 90 MG PO TABS
90.0000 mg | ORAL_TABLET | Freq: Two times a day (BID) | ORAL | Status: DC
  Administered 2019-03-14 – 2019-03-19 (×10): 90 mg via ORAL
  Filled 2019-03-13 (×11): qty 1

## 2019-03-13 MED ORDER — FENTANYL CITRATE (PF) 100 MCG/2ML IJ SOLN
INTRAMUSCULAR | Status: DC | PRN
  Administered 2019-03-13 (×3): 50 ug via INTRAVENOUS

## 2019-03-13 MED ORDER — ONDANSETRON HCL 4 MG/2ML IJ SOLN
INTRAMUSCULAR | Status: DC | PRN
  Administered 2019-03-13: 4 mg via INTRAVENOUS

## 2019-03-13 MED ORDER — SUCCINYLCHOLINE CHLORIDE 20 MG/ML IJ SOLN
INTRAMUSCULAR | Status: AC
  Filled 2019-03-13: qty 1

## 2019-03-13 MED ORDER — PROPOFOL 10 MG/ML IV BOLUS
INTRAVENOUS | Status: AC
  Filled 2019-03-13: qty 20

## 2019-03-13 MED ORDER — SUCCINYLCHOLINE CHLORIDE 20 MG/ML IJ SOLN
INTRAMUSCULAR | Status: AC
  Filled 2019-03-13: qty 2

## 2019-03-13 MED ORDER — BUPIVACAINE LIPOSOME 1.3 % IJ SUSP
INTRAMUSCULAR | Status: DC | PRN
  Administered 2019-03-13: 40 mL

## 2019-03-13 MED ORDER — ROCURONIUM BROMIDE 50 MG/5ML IV SOLN
INTRAVENOUS | Status: AC
  Filled 2019-03-13: qty 3

## 2019-03-13 MED ORDER — LIDOCAINE HCL (PF) 2 % IJ SOLN
INTRAMUSCULAR | Status: AC
  Filled 2019-03-13: qty 60

## 2019-03-13 SURGICAL SUPPLY — 40 items
BANDAGE ELASTIC 6 LF NS (GAUZE/BANDAGES/DRESSINGS) ×3 IMPLANT
BLADE SAGITTAL WIDE XTHICK NO (BLADE) IMPLANT
BLADE SAW SAG 25.4X90 (BLADE) ×3 IMPLANT
BNDG COHESIVE 4X5 TAN STRL (GAUZE/BANDAGES/DRESSINGS) ×3 IMPLANT
BNDG GAUZE 4.5X4.1 6PLY STRL (MISCELLANEOUS) ×6 IMPLANT
BRUSH SCRUB EZ  4% CHG (MISCELLANEOUS) ×2
BRUSH SCRUB EZ 4% CHG (MISCELLANEOUS) ×1 IMPLANT
CANISTER SUCT 1200ML W/VALVE (MISCELLANEOUS) ×3 IMPLANT
CHLORAPREP W/TINT 26ML (MISCELLANEOUS) ×3 IMPLANT
COVER WAND RF STERILE (DRAPES) ×3 IMPLANT
DRAIN PENROSE 1/4X12 LTX (DRAIN) IMPLANT
DRAPE INCISE IOBAN 66X45 STRL (DRAPES) IMPLANT
DRSG TEGADERM 4X10 (GAUZE/BANDAGES/DRESSINGS) ×2 IMPLANT
ELECT CAUTERY BLADE 6.4 (BLADE) ×3 IMPLANT
ELECT REM PT RETURN 9FT ADLT (ELECTROSURGICAL) ×3
ELECTRODE REM PT RTRN 9FT ADLT (ELECTROSURGICAL) ×1 IMPLANT
GAUZE XEROFORM 1X8 LF (GAUZE/BANDAGES/DRESSINGS) ×6 IMPLANT
GLOVE BIO SURGEON STRL SZ7 (GLOVE) ×6 IMPLANT
GLOVE INDICATOR 7.5 STRL GRN (GLOVE) ×3 IMPLANT
GOWN STRL REUS W/ TWL LRG LVL3 (GOWN DISPOSABLE) ×2 IMPLANT
GOWN STRL REUS W/ TWL XL LVL3 (GOWN DISPOSABLE) ×1 IMPLANT
GOWN STRL REUS W/TWL LRG LVL3 (GOWN DISPOSABLE) ×4
GOWN STRL REUS W/TWL XL LVL3 (GOWN DISPOSABLE) ×2
HANDLE YANKAUER SUCT BULB TIP (MISCELLANEOUS) ×3 IMPLANT
KIT TURNOVER KIT A (KITS) ×3 IMPLANT
LABEL OR SOLS (LABEL) ×3 IMPLANT
NS IRRIG 1000ML POUR BTL (IV SOLUTION) ×3 IMPLANT
PACK EXTREMITY ARMC (MISCELLANEOUS) ×3 IMPLANT
PAD ABD DERMACEA PRESS 5X9 (GAUZE/BANDAGES/DRESSINGS) ×6 IMPLANT
PAD PREP 24X41 OB/GYN DISP (PERSONAL CARE ITEMS) ×3 IMPLANT
SPONGE LAP 18X18 RF (DISPOSABLE) ×5 IMPLANT
STAPLER SKIN PROX 35W (STAPLE) ×3 IMPLANT
STOCKINETTE M/LG 89821 (MISCELLANEOUS) ×3 IMPLANT
SUT SILK 2 0 (SUTURE) ×4
SUT SILK 2 0 SH (SUTURE) ×6 IMPLANT
SUT SILK 2-0 18XBRD TIE 12 (SUTURE) ×1 IMPLANT
SUT SILK 3 0 (SUTURE) ×2
SUT SILK 3-0 18XBRD TIE 12 (SUTURE) ×1 IMPLANT
SUT VIC AB 0 CT1 36 (SUTURE) ×8 IMPLANT
SUT VIC AB 2-0 CT1 (SUTURE) ×6 IMPLANT

## 2019-03-13 NOTE — H&P (Signed)
[place] [facility] History & Physical Update  The patient was interviewed and re-examined.  The patient's previous History and Physical has been reviewed and is unchanged.  There is no change in the plan of care. We plan to proceed with the scheduled procedure of left BKA.  Leotis Pain, MD  03/13/2019, 12:33 PM

## 2019-03-13 NOTE — Anesthesia Postprocedure Evaluation (Signed)
Anesthesia Post Note  Patient: Richard [name]  Procedure(s) Performed: AMPUTATION BELOW KNEE (Left Leg Lower)  Patient location during evaluation: PACU Anesthesia Type: General Level of consciousness: awake and alert Pain management: pain level controlled Vital Signs Assessment: post-procedure vital signs reviewed and stable Respiratory status: spontaneous breathing, nonlabored ventilation and respiratory function stable Cardiovascular status: blood pressure returned to baseline and stable Postop Assessment: no apparent nausea or vomiting Anesthetic complications: no     Last Vitals:  Vitals:   03/13/19 1510 03/13/19 1530  BP: 123/74 (!) 100/52  Pulse: 84 79  Resp: 12 16  Temp:  36.7 C  SpO2: 100% 100%    Last Pain:  Vitals:   03/13/19 1530  TempSrc: Oral  PainSc:                  Durenda Hurt

## 2019-03-13 NOTE — Progress Notes (Signed)
Daily Progress Note   Patient Name: Richard [name]       Date: 03/13/2019 DOB: [birth year]  Age: 63 y.o. MRN#: [id number] Attending Physician: [name], MD Primary Care Physician: [facility] [place] Date: 02/28/2019  Reason for Consultation/Follow-up: Support  Subjective: Patient resting in bed watching t.v. He appears to be in better spirits today than last visit. He states the procedure yesterday made his pain much better. He is eager to have his amputation and shrink his stump for a prosthetic.    Length of Stay: 13  Current Medications: Scheduled Meds:  . aspirin  81 mg Oral Daily  . enoxaparin (LOVENOX) injection  40 mg Subcutaneous Q24H  . insulin aspart  0-15 Units Subcutaneous TID WC  . insulin aspart  0-5 Units Subcutaneous QHS  . insulin aspart  10 Units Subcutaneous TID WC  . insulin glargine  19 Units Subcutaneous Daily  . ketorolac  15 mg Intravenous Q8H  . mouth rinse  15 mL Mouth Rinse BID  . Ensure Max Protein  11 oz Oral BID BM  . rosuvastatin  40 mg Oral q1800  . sodium chloride flush  10 mL Intravenous Q12H    Continuous Infusions: . sodium chloride 75 mL/hr at 03/13/19 0426  . piperacillin-tazobactam (ZOSYN)  IV 3.375 g (03/13/19 0753)    PRN Meds: acetaminophen, HYDROmorphone (DILAUDID) injection, naLOXone (NARCAN)  injection, ondansetron (ZOFRAN) IV, oxyCODONE-acetaminophen, sodium chloride flush  Physical Exam Pulmonary:     Effort: Pulmonary effort is normal.  Skin:    General: Skin is warm and dry.  Neurological:     Mental Status: He is alert.     Comments: Oriented             Vital Signs: BP (!) 144/80 (BP Location: Right Arm)   Pulse 87   Temp 98.7 F (37.1 C) (Oral)   Resp 19   Ht 5\' 5"  (1.651 m)   Wt 57.3 kg    SpO2 99%   BMI 21.02 kg/m  SpO2: SpO2: 99 % O2 Device: O2 Device: Room Air O2 Flow Rate: O2 Flow Rate (L/min): 6 L/min  Intake/output summary:   Intake/Output Summary (Last 24 hours) at 03/13/2019 0930 Last data filed at 03/13/2019 0500 Gross per 24 hour  Intake 777.21 ml  Output 2407 ml  Net -1629.79 ml   LBM: Last BM Date: 03/12/19 Baseline Weight: Weight: 54 kg Most recent weight: Weight: 57.3 kg       Palliative Assessment/Data:    Flowsheet Rows     Most Recent Value  Intake Tab  Referral Department  Critical care  Unit at Time of Referral  ICU  Palliative Care Primary Diagnosis  Cancer  Date Notified  02/28/19  Palliative Care Type  New Palliative care  Reason for referral  Clarify Goals of Care  Date of Admission  02/28/19  Date first seen by Palliative Care  03/02/19  # of days Palliative referral response time  2 Day(s)  # of days IP prior to Palliative referral  0  Clinical Assessment  Psychosocial & Spiritual Assessment  Palliative Care Outcomes      Patient Active Problem List   Diagnosis Date Noted  . Pressure injury of skin 03/12/2019  . Respiratory failure ([place]) 02/28/2019  . Diabetes with hyperosmolar coma ([place]) 11/11/2018  . A-fib ([place]) 09/20/2018  . Hyperglycemia 08/03/2018  . Protein-calorie malnutrition, severe 01/10/2018  . Sepsis ([place]) 01/09/2018  . DKA, type 2 ([place]) 09/05/2017  . Ileus ([place]) 08/24/2017  . Diabetes ([place]) 08/24/2017  . Atrial flutter ([place]) 08/24/2017  . CAD (coronary artery disease) 08/24/2017  . HTN (hypertension) 08/24/2017  . Atrial fibrillation ([place]) 08/24/2017  . Tobacco use disorder, severe, dependence 07/25/2017  . Wound dehiscence, surgical, initial encounter 06/24/2017  . Acute hematogenous osteomyelitis of right foot ([place]) 06/20/2017  . Adenocarcinoma of pancreas ([place]) 06/20/2017  . Esophagitis 06/20/2017  . Cellulitis of right lower extremity 06/10/2017  . Hypertension, poor control 06/10/2017  . Insulin  dependent type 2 diabetes mellitus ([place]) 06/10/2017  . Chest pain 08/10/2015    Palliative Care Assessment & Plan    Recommendations/Plan:  Palliative will continue to follow.   Recommend psych consult to help with substance abuse issues as he wants to change his lifestyle.      Code Status:    Code Status Orders  (From admission, onward)         Start     Ordered   03/02/19 1454  Full code  Continuous     03/02/19 1454        Code Status History    Date Active Date Inactive Code Status Order ID Comments User Context   02/28/2019 1449 03/01/2019 0836 Full Code [id number]  Gorden Harms, MD Inpatient   02/28/2019 1449 02/28/2019 1449 Full Code [id number]  Gorden Harms, MD Inpatient   11/11/2018 0758 11/18/2018 1814 DNR [id number]  Loletha Grayer, MD ED   09/20/2018 1635 09/24/2018 1854 Full Code [id number]  Fritzi Mandes, MD ED   08/03/2018 1020 08/04/2018 1813 Full Code [id number]  Salary, Avel Peace, MD ED   01/09/2018 1025 01/11/2018 1932 Full Code [id number]  Harrie Foreman, MD Inpatient   09/05/2017 1559 09/11/2017 1928 Full Code [id number]  Fritzi Mandes, MD Inpatient   08/24/2017 0238 08/29/2017 2131 Full Code [id number]  Lance Coon, MD Inpatient   08/10/2015 0822 08/11/2015 1947 Full Code [id number]  Harrie Foreman, MD Inpatient       Prognosis:   Unable to determine  Discharge Planning:  To Be Determined   Thank you for allowing the Palliative Medicine Team to assist in the care of this patient.   Total Time 35 min Prolonged Time Billed  no       Greater than 50%  of this time was spent counseling and  coordinating care related to the above assessment and plan.  Asencion Gowda, NP  Please contact Palliative Medicine Team phone at [phone] for questions and concerns.

## 2019-03-13 NOTE — Care Management Important Message (Signed)
Important Message  Patient Details  Name: Richard [name] MRN: [id number] Date of Birth: 1955/10/31   Medicare Important Message Given:  Yes    [name] 03/13/2019, 10:54 AM

## 2019-03-13 NOTE — Transfer of Care (Signed)
Immediate Anesthesia Transfer of Care Note  Patient: Richard [name]  Procedure(s) Performed: AMPUTATION BELOW KNEE (Left Leg Lower)  Patient Location: PACU  Anesthesia Type:General  Level of Consciousness: sedated  Airway & Oxygen Therapy: Patient Spontanous Breathing and Patient connected to face mask oxygen  Post-op Assessment: Report given to RN and Post -op Vital signs reviewed and stable  Post vital signs: Reviewed and stable  Last Vitals:  Vitals Value Taken Time  BP 116/77 03/13/2019  2:00 PM  Temp    Pulse 71 03/13/2019  2:00 PM  Resp 9 03/13/2019  2:00 PM  SpO2 100 % 03/13/2019  2:00 PM  Vitals shown include unvalidated device data.  Last Pain:  Vitals:   03/13/19 1400  TempSrc:   PainSc: 0-No pain      Patients Stated Pain Goal: 0 (16/55/37 4827)  Complications: No apparent anesthesia complications

## 2019-03-13 NOTE — Anesthesia Post-op Follow-up Note (Signed)
Anesthesia QCDR form completed.        

## 2019-03-13 NOTE — Progress Notes (Signed)
ID  BKA ( left) today Doing well Eating [employer]   Patient Vitals for the past 24 hrs:  BP Temp Temp src Pulse Resp SpO2  03/13/19 1644 (!) 110/58 98.7 F (37.1 C) - 89 14 100 %  03/13/19 1530 (!) 100/52 98 F (36.7 C) Oral 79 16 100 %  03/13/19 1510 123/74 - - 84 12 100 %  03/13/19 1502 - - - 83 20 100 %  03/13/19 1500 123/73 97.9 F (36.6 C) - 84 16 100 %  03/13/19 1455 - - - 84 (!) 23 100 %  03/13/19 1450 119/79 - - 86 13 100 %  03/13/19 1445 - - - 83 16 100 %  03/13/19 1440 130/73 - - 83 20 100 %  03/13/19 1430 140/77 - - 85 16 100 %  03/13/19 1421 133/79 - - 85 13 100 %  03/13/19 1410 (!) 141/84 - - 83 11 100 %  03/13/19 1402 116/77 (!) 97.2 F (36.2 C) - 74 12 100 %  03/13/19 1400 116/77 (!) 97.2 F (36.2 C) - 72 (!) 9 100 %  03/13/19 0421 (!) 144/80 98.7 F (37.1 C) Oral 87 19 99 %   Left BKA- surgical dresisng  CBC Latest Ref Rng & Units 03/13/2019 03/12/2019 03/12/2019  WBC 4.0 - 10.5 K/uL 33.2(H) 23.6(H) 18.6(H)  Hemoglobin 13.0 - 17.0 g/dL 8.3(L) 8.3(L) 8.5(L)  Hematocrit 39.0 - 52.0 % 24.8(L) 25.2(L) 26.2(L)  Platelets 150 - 400 K/uL 667(H) 644(H) 656(H)    Left leg gangrene-s/p BKA- watch WBC Currently on zosyn- continue until leucocytosis starts to decline and wound healthy

## 2019-03-13 NOTE — Op Note (Signed)
OPERATIVE NOTE   PROCEDURE: Left below-the-knee amputation  PRE-OPERATIVE DIAGNOSIS: Left foot gangrene  POST-OPERATIVE DIAGNOSIS: same as above  SURGEON: Leotis Pain, MD  ASSISTANT(S): Hezzie Bump, PA-C  ANESTHESIA: general  ESTIMATED BLOOD LOSS: 50 cc  FINDING(S): none  SPECIMEN(S):  Left below-the-knee amputation  INDICATIONS:   Richard [name] is a 63 y.o. male who presents with left leg gangrene.  The patient is scheduled for a left below-the-knee amputation.  I discussed in depth with the patient the risks, benefits, and alternatives to this procedure.  The patient is aware that the risk of this operation included but are not limited to:  bleeding, infection, myocardial infarction, stroke, death, failure to heal amputation wound, and possible need for more proximal amputation.  The patient is aware of the risks and agrees proceed forward with the procedure. An assistant was present during the procedure to help facilitate the exposure and expedite the procedure.   DESCRIPTION:  After full informed written consent was obtained from the patient, the patient was brought back to the operating room, and placed supine upon the operating table. The assistant provided retraction and mobilization to help facilitate exposure and expedite the procedure throughout the entire procedure.  This included following suture, using retractors, and optimizing lighting. The patient was then prepped and draped in the standard fashion for a below-the-knee amputation.  After obtaining adequate anesthesia, the patient was prepped and draped in the standard fashion for a left below-the-knee amputation.  I marked out the anterior incision two finger breadths below the tibial tuberosity and then the marked out a posterior flap that was one third of the circumference of the calf in length.   I made the incisions for these flaps, and then dissected through the subcutaneous tissue, fascia, and muscle anteriorly.   I elevated  the periosteal tissue superiorly so that the tibia was about 3-4 cm shorter than the anterior skin flap.  I then transected the tibia with a power saw and then took a wedge off the tibia anteriorly with the power saw.  Then I smoothed out the rough edges.  In a similar fashion, I cut back the fibula about two centimeters higher than the level of the tibia with a bone cutter.  I put a bone hook into the distal tibia and then used a large amputation knife to sharply develop a tissue plane through the muscle along the fibula.  In such fashion, the posterior flap was developed.  At this point, the specimen was passed off the field as the below-the-knee amputation.  At this point, I clamped all visibly bleeding arteries and veins using a combination of suture ligation with Silk suture and electrocautery.  Bleeding continued to be controlled with electrocautery and suture ligature.  The stump was washed off with sterile normal saline and no further active bleeding was noted.  I then instilled a total of 40 cc of aspirate L mixed with bupivacaine for a long-acting anesthetic both around the nerve, around the bone, and in the subcutaneous tissues.  I reapproximated the anterior and posterior fascia  with interrupted stitches of 0 Vicryl.  This was completed along the entire length of anterior and posterior fascia until there were no more loose space in the fascial line. I then placed a layer of 2-0 Vicryl sutures in the subcutaneous tissue. The skin was then  reapproximated with staples.  The stump was washed off and dried.  The incision was dressed with Xeroform and  then fluffs were applied.  Kerlix was wrapped around the leg and then gently an ACE wrap was applied.    COMPLICATIONS: none  CONDITION: stable   Leotis Pain  03/13/2019, 1:53 PM    This note was created with Dragon Medical transcription system. Any errors in dictation are purely unintentional.

## 2019-03-13 NOTE — Progress Notes (Signed)
Patient ID: Richard [name], male   DOB: Feb 26, 1956, 63 y.o.   MRN: [id number]   [employer] PROGRESS NOTE  Richard [name] PJK:[id number] DOB: [birth year] DOA: 02/28/2019 PCP: [facility] [place]  HPI/Subjective: Patient has no complaints.  Blood sugar is high at 322 this am. WBC up to 33.2. Objective: Vitals:   03/13/19 1500 03/13/19 1502  BP: 123/73   Pulse: 84 83  Resp: 16 20  Temp: 97.9 F (36.6 C)   SpO2: 100% 100%    Filed Weights   02/28/19 1443 03/01/19 0416 03/04/19 1248  Weight: 55 kg 57.3 kg 57.3 kg    ROS: Review of Systems  Constitutional: Negative for chills and fever.  Eyes: Negative for blurred vision.  Respiratory: Negative for cough and shortness of breath.   Cardiovascular: Negative for chest pain.  Gastrointestinal: Negative for abdominal pain, constipation, diarrhea, nausea and vomiting.  Genitourinary: Negative for dysuria.  Musculoskeletal: Negative for joint pain.  Neurological: Negative for dizziness and headaches.   Exam: Physical Exam  HENT:  Nose: No mucosal edema.  Mouth/Throat: No oropharyngeal exudate or posterior oropharyngeal edema.  Eyes: Pupils are equal, round, and reactive to light. Conjunctivae and lids are normal.  Neck: No JVD present. Carotid bruit is not present. No edema present. No thyroid mass and no thyromegaly present.  Cardiovascular: S1 normal and S2 normal. Exam reveals no gallop.  No murmur heard. Respiratory: No respiratory distress. He has no wheezes. He has no rhonchi. He has no rales.  GI: Soft. Bowel sounds are normal. There is no abdominal tenderness.  Musculoskeletal:     Right ankle: He exhibits no swelling.     Left ankle: He exhibits no swelling.     Comments: Gangrenous left first and second toe  Lymphadenopathy:    He has no cervical adenopathy.  Neurological: He is alert.  Moves all extremities on his own  Skin: Skin is warm. Nails show no clubbing.  Left first and second toe  gangrenous.  Demarcation starting to go up the left foot now.  Has some blistering on the left foot.  Prior amputation of the right trans-met. 2 stage II decubiti noninfected on the buttock sacral area.  Psychiatric: He has a normal mood and affect.      Data Reviewed: Basic Metabolic Panel: Recent Labs  Lab 03/09/19 0422 03/10/19 0543 03/11/19 0513 03/12/19 0437 03/13/19 0512  NA 137 135 130* 134* 135  K 4.4 4.5 4.1 4.2 4.2  CL 101 99 95* 99 100  CO2 _0 GLUCOSE 220* 325* 280* 277* 362*  BUN _1 25*  CREATININE 0.57* 0.64 0.83 0.69 0.76  CALCIUM 7.8* 7.9* 7.8* 7.9* 7.8*  MG  --  2.1 2.2 2.2 2.3  PHOS  --  3.2  --   --   --    Liver Function Tests: Recent Labs  Lab 03/10/19 0543  AST 14*  ALT 8  ALKPHOS 35*  BILITOT 0.5  PROT 6.4*  ALBUMIN 2.0*   No results for input(s): LIPASE, AMYLASE in the last 168 hours. CBC: Recent Labs  Lab 03/09/19 0422 03/10/19 0543 03/10/19 2023 03/11/19 0513 03/12/19 0437 03/12/19 1632 03/13/19 0512  WBC 17.8* 20.2*  --  21.5* 18.6* 23.6* 33.2*  NEUTROABS 12.9* 16.0*  --   --   --   --   --   HGB 7.1* 7.2* 8.5* 8.6* 8.5* 8.3* 8.3*  HCT 22.3* 22.3* 25.8* 26.0* 26.2* 25.2* 24.8*  MCV 87.1 88.1  --  86.7 87.9 87.5 87.0  PLT 469* 550*  --  585* 656* 644* 667*   Cardiac Enzymes: No results for input(s): CKTOTAL, CKMB, CKMBINDEX, TROPONINI in the last 168 hours.  CBG: Recent Labs  Lab 03/12/19 2115 03/13/19 0030 03/13/19 0802 03/13/19 1155 03/13/19 1403  GLUCAP 243* 330* 322* 192* 157*    Recent Results (from the past 240 hour(s))  CULTURE, BLOOD (ROUTINE X 2) w Reflex to ID Panel     Status: None   Collection Time: 03/05/19  9:56 AM  Result Value Ref Range Status   Specimen Description BLOOD LEFT HAND  Final   Special Requests   Final    BOTTLES DRAWN AEROBIC AND ANAEROBIC Blood Culture results may not be optimal due to an excessive volume of blood received in culture bottles   Culture   Final     NO GROWTH 5 DAYS Performed at [facility], [address] [place], [place] [address]    Report Status 03/10/2019 FINAL  Final  CULTURE, BLOOD (ROUTINE X 2) w Reflex to ID Panel     Status: None   Collection Time: 03/05/19  9:56 AM  Result Value Ref Range Status   Specimen Description BLOOD RIGHT FA  Final   Special Requests   Final    BOTTLES DRAWN AEROBIC AND ANAEROBIC Blood Culture adequate volume   Culture   Final    NO GROWTH 5 DAYS Performed at [facility], [address] [place], [place] [address]    Report Status 03/10/2019 FINAL  Final  CULTURE, BLOOD (ROUTINE X 2) w Reflex to ID Panel     Status: None (Preliminary result)   Collection Time: 03/09/19 12:55 PM  Result Value Ref Range Status   Specimen Description BLOOD LEFT ARM  Final   Special Requests   Final    BOTTLES DRAWN AEROBIC AND ANAEROBIC Blood Culture adequate volume   Culture   Final    NO GROWTH 4 DAYS Performed at [facility], [address] [place], [place] [address]    Report Status PENDING  Incomplete  CULTURE, BLOOD (ROUTINE X 2) w Reflex to ID Panel     Status: None (Preliminary result)   Collection Time: 03/09/19  1:00 PM  Result Value Ref Range Status   Specimen Description BLOOD LEFT HAND  Final   Special Requests   Final    BOTTLES DRAWN AEROBIC AND ANAEROBIC Blood Culture adequate volume   Culture   Final    NO GROWTH 4 DAYS Performed at [facility], [address], [place], [place] [address]    Report Status PENDING  Incomplete  Urine Culture     Status: None   Collection Time: 03/09/19  1:23 PM  Result Value Ref Range Status   Specimen Description   Final    URINE, CLEAN CATCH Performed at [facility], [address] [place], [place] [address]    Special Requests   Final    NONE Performed at [facility], [address], [place], [place] [address]    Culture   Final    NO GROWTH Performed at [facility],  [place] [address], [place], [place] [address]    Report Status 03/10/2019 FINAL  Final     Studies: No results found.  Scheduled Meds: . [MAR Hold] aspirin  81 mg Oral Daily  . [MAR Hold] enoxaparin (LOVENOX) injection  40 mg Subcutaneous Q24H  . [  MAR Hold] insulin aspart  0-15 Units Subcutaneous TID WC  . [MAR Hold] insulin aspart  0-5 Units Subcutaneous QHS  . [MAR Hold] insulin aspart  10 Units Subcutaneous TID WC  . [MAR Hold] insulin glargine  19 Units Subcutaneous Daily  . [MAR Hold] ketorolac  15 mg Intravenous Q8H  . [MAR Hold] mouth rinse  15 mL Mouth Rinse BID  . [MAR Hold] Ensure Max Protein  11 oz Oral BID BM  . [MAR Hold] rosuvastatin  40 mg Oral q1800  . [MAR Hold] sodium chloride flush  10 mL Intravenous Q12H   Continuous Infusions: . sodium chloride 75 mL/hr at 03/13/19 0426  . [MAR Hold] piperacillin-tazobactam (ZOSYN)  IV 3.375 g (03/13/19 0753)    Assessment/Plan:  1.  Sepsis with Proteus vulgaris bacteremia.  Likely the gangrenous toes as the source.  Demarcation starting to go up to the left foot.  Patient with low-grade temperature.  Repeat blood cultures so far negative.  Patient on Rocephin.  Patient has poor circulation and would need a revascularization procedure prior to amputation in order for it to heal.  Patient prefers surgery at this time. Changed from Rocephin to Zosyn per Dr. Arlyss Repress. Worsening leukocytosis, repeated blood culture is negative so far, follow up CBC.    2.  Severe peripheral vascular disease.  Angiogram showing poor blood supply to the lower extremities. S/P L CFA endarterectomy, Left below-the-knee amputation today by Dr. Lucky Cowboy.  3.  Acute hypoxic respiratory failure.  Patient tapered off oxygen. 4.  Acute myocardial infarction.  Conservative management as per cardiology.  Patient on aspirin.  Hold off on Brilinta because the patient may need a amputation.  High-dose Crestor started. 5.  Hyperglycemia due to type 2 diabetes mellitus.    Increased Lantus to 19 units with sliding scale. Added novolog 10 unit AC. 6.  Acute kidney injury normalized with IV fluids. Dehydration. Started IVF. 7.  Cocaine abuse  8.  Chronic atrial fibrillation.  We will have to discuss anticoagulation after procedure. 9.  History of pancreatic cancer status post Whipple procedure in 2018 10.  Hypernatremia.  This has normalized. 11.  Anemia of chronic disease.  Patient has received 2 units of packed red blood cells. Hb down to 7.2. no active bleeding.  Up to 8.6 after 1 unit PRBC transfusion,  follow-up hemoglobin 8.3. 12.  2 stage II decubitus on buttocks.  Local wound care.  Code Status:     Code Status Orders  (From admission, onward)         Start     Ordered   03/02/19 1454  Full code  Continuous     03/02/19 1454        Code Status History    Date Active Date Inactive Code Status Order ID Comments User Context   02/28/2019 1449 03/01/2019 0836 Full Code [id number]  Gorden Harms, MD Inpatient   02/28/2019 1449 02/28/2019 1449 Full Code [id number]  Gorden Harms, MD Inpatient   11/11/2018 0758 11/18/2018 1814 DNR [id number]  Loletha Grayer, MD ED   09/20/2018 1635 09/24/2018 1854 Full Code [id number]  Fritzi Mandes, MD ED   08/03/2018 1020 08/04/2018 1813 Full Code [id number]  Salary, Avel Peace, MD ED   01/09/2018 1025 01/11/2018 1932 Full Code [id number]  Harrie Foreman, MD Inpatient   09/05/2017 1559 09/11/2017 1928 Full Code [id number]  Fritzi Mandes, MD Inpatient   08/24/2017 0238 08/29/2017 2131 Full Code [id number]  Lance Coon, MD Inpatient   08/10/2015  1017 08/11/2015 1947 Full Code [id number]  Harrie Foreman, MD Inpatient     Disposition Plan: TBD  Consultants:  Critical care specialist  Cardiology  Palliative care  Vascular surgery  Podiatry  Antibiotics:  Rocephin  Time spent: 27 minutes.  Spoke with patient's significant other Ms. Lewis on the phone. [name]  [employer]

## 2019-03-13 NOTE — Anesthesia Procedure Notes (Signed)
Procedure Name: Intubation Date/Time: 03/13/2019 12:51 PM Performed by: Hedda Slade, CRNA Pre-anesthesia Checklist: Patient identified, Patient being monitored, Timeout performed, Emergency Drugs available and Suction available Patient Re-evaluated:Patient Re-evaluated prior to induction Oxygen Delivery Method: Circle system utilized Preoxygenation: Pre-oxygenation with 100% oxygen Induction Type: IV induction Ventilation: Mask ventilation without difficulty Laryngoscope Size: 3 and McGraph Grade View: Grade I Tube type: Oral Tube size: 7.0 mm Number of attempts: 1 Airway Equipment and Method: Stylet Placement Confirmation: ETT inserted through vocal cords under direct vision,  positive ETCO2 and breath sounds checked- equal and bilateral Secured at: 22 cm Tube secured with: Tape Dental Injury: Teeth and Oropharynx as per pre-operative assessment

## 2019-03-13 NOTE — Anesthesia Preprocedure Evaluation (Addendum)
Anesthesia Evaluation  Patient identified by MRN, date of birth, ID band Patient awake    Reviewed: Allergy & Precautions, NPO status , Patient's Chart, lab work & pertinent test results  Airway Mallampati: III  TM Distance: >3 FB     Dental  (+) Poor Dentition, Missing, Loose   Pulmonary COPD, Current Smoker, former smoker,           Cardiovascular Exercise Tolerance: Poor hypertension, Pt. on medications (-) angina+ CAD and +CHF  + dysrhythmias Atrial Fibrillation      Neuro/Psych negative neurological ROS     GI/Hepatic Neg liver ROS, Pancreatic ca s/p whipple   Endo/Other  diabetes, Poorly Controlled, Type 1, Insulin Dependent  Renal/GU      Musculoskeletal   Abdominal Normal abdominal exam  (+)   Peds negative pediatric ROS (+)  Hematology  (+) Blood dyscrasia (Hgb 8.3), anemia ,   Anesthesia Other Findings 63 yo with Proteus bacteremia with gangrenous toe as source, severe PVD s/p femoral endarterectomy and embolectomy with Dr. Lucky Cowboy on 5/21, IDDM, admitted with DKA, hypothermia and GCS 7, also cocaine positive on admission, intubated for one day.  Now extubated in RA.  Still with uncontrolled BG in setting of infection.  Gangrenous left foot S/p right foot amputation  Resp: RA CV: No vasoactive infusions Access: R IJ TL  Reproductive/Obstetrics                            Anesthesia Physical  Anesthesia Plan  ASA: III  Anesthesia Plan: General ETT   Post-op Pain Management:    Induction: Intravenous  PONV Risk Score and Plan:   Airway Management Planned: Oral ETT  Additional Equipment:   Intra-op Plan:   Post-operative Plan: Extubation in OR  Informed Consent: I have reviewed the patients History and Physical, chart, labs and discussed the procedure including the risks, benefits and alternatives for the proposed anesthesia with the patient or authorized  representative who has indicated his/her understanding and acceptance.     Dental advisory given  Plan Discussed with: CRNA and Anesthesiologist  Anesthesia Plan Comments:         Anesthesia Quick Evaluation

## 2019-03-13 NOTE — Progress Notes (Signed)
OT Cancellation Note  Patient Details Name: Montrey Buist MRN: [id number] DOB: 06-11-1956   Cancelled Treatment:    Reason Eval/Treat Not Completed: Patient at procedure or test/ unavailable. Consult received, chart reviewed. Pt out of room for surgery. Will re-attempt at later date/time as medically appropriate.   Jeni Salles, MPH, MS, OTR/L ascom [phone] 03/13/19, 1:03 PM

## 2019-03-14 ENCOUNTER — Encounter: Payer: Self-pay | Admitting: Vascular Surgery

## 2019-03-14 DIAGNOSIS — F1410 Cocaine abuse, uncomplicated: Secondary | ICD-10-CM

## 2019-03-14 LAB — CBC
HCT: 20.9 % — ABNORMAL LOW (ref 39.0–52.0)
Hemoglobin: 6.7 g/dL — ABNORMAL LOW (ref 13.0–17.0)
MCH: 28.3 pg (ref 26.0–34.0)
MCHC: 32.1 g/dL (ref 30.0–36.0)
MCV: 88.2 fL (ref 80.0–100.0)
Platelets: 594 10*3/uL — ABNORMAL HIGH (ref 150–400)
RBC: 2.37 MIL/uL — ABNORMAL LOW (ref 4.22–5.81)
RDW: 15.9 % — ABNORMAL HIGH (ref 11.5–15.5)
WBC: 20.8 10*3/uL — ABNORMAL HIGH (ref 4.0–10.5)
nRBC: 0.1 % (ref 0.0–0.2)

## 2019-03-14 LAB — GLUCOSE, CAPILLARY
Glucose-Capillary: 107 mg/dL — ABNORMAL HIGH (ref 70–99)
Glucose-Capillary: 97 mg/dL (ref 70–99)
Glucose-Capillary: 189 mg/dL — ABNORMAL HIGH (ref 70–99)
Glucose-Capillary: 108 mg/dL — ABNORMAL HIGH (ref 70–99)

## 2019-03-14 LAB — BASIC METABOLIC PANEL
Anion gap: 7 (ref 5–15)
BUN: 19 mg/dL (ref 8–23)
CO2: 26 mmol/L (ref 22–32)
Calcium: 7.3 mg/dL — ABNORMAL LOW (ref 8.9–10.3)
Chloride: 104 mmol/L (ref 98–111)
Creatinine, Ser: 0.81 mg/dL (ref 0.61–1.24)
GFR calc Af Amer: >60 mL/min (ref >60)
GFR calc non Af Amer: >60 mL/min (ref >60)
Glucose, Bld: 107 mg/dL — ABNORMAL HIGH (ref 70–99)
Potassium: 4.1 mmol/L (ref 3.5–5.1)
Sodium: 137 mmol/L (ref 135–145)

## 2019-03-14 LAB — PREPARE RBC (CROSSMATCH)

## 2019-03-14 LAB — CULTURE, BLOOD (ROUTINE X 2)
Culture: NO GROWTH
Culture: NO GROWTH
Special Requests: ADEQUATE
Special Requests: ADEQUATE

## 2019-03-14 MED ORDER — HYDROMORPHONE HCL 1 MG/ML IJ SOLN
1.0000 mg | Freq: Once | INTRAMUSCULAR | Status: AC
  Administered 2019-03-14: 1 mg via INTRAVENOUS

## 2019-03-14 MED ORDER — SODIUM CHLORIDE 0.9% IV SOLUTION
Freq: Once | INTRAVENOUS | Status: AC
  Administered 2019-03-14: 13:00:00 via INTRAVENOUS

## 2019-03-14 MED ORDER — ACETAMINOPHEN 325 MG PO TABS
650.0000 mg | ORAL_TABLET | Freq: Once | ORAL | Status: AC
  Administered 2019-03-14: 13:00:00 650 mg via ORAL
  Filled 2019-03-14: qty 2

## 2019-03-14 NOTE — Progress Notes (Signed)
Patient ID: Richard [name], male   DOB: 03-Sep-1956, 63 y.o.   MRN: [id number]   [facility] PROGRESS NOTE  Wilmore Holsomback [id number] DOB: 1956-10-13 DOA: 02/28/2019 PCP: [facility] [place]  HPI/Subjective: Patient has no complaints.  Blood sugar is 189. Objective: Vitals:   03/14/19 0100 03/14/19 0507  BP: 127/76 112/72  Pulse: 80 79  Resp: 16 18  Temp:  98.2 F (36.8 C)  SpO2: 100% 100%    Filed Weights   02/28/19 1443 03/01/19 0416 03/04/19 1248  Weight: 55 kg 57.3 kg 57.3 kg    ROS: Review of Systems  Constitutional: Positive for malaise/fatigue. Negative for chills and fever.  HENT: Negative for congestion and sore throat.   Eyes: Negative for blurred vision.  Respiratory: Negative for cough and shortness of breath.   Cardiovascular: Negative for chest pain.  Gastrointestinal: Negative for abdominal pain, constipation, diarrhea, nausea and vomiting.  Genitourinary: Negative for dysuria.  Musculoskeletal: Negative for joint pain.  Skin: Negative for rash.  Neurological: Negative for dizziness and headaches.  Psychiatric/Behavioral: Negative for depression. The patient is not nervous/anxious.    Exam: Physical Exam  Constitutional: He is oriented to person, place, and time.  HENT:  Nose: No mucosal edema.  Mouth/Throat: No oropharyngeal exudate or posterior oropharyngeal edema.  Eyes: Pupils are equal, round, and reactive to light. Conjunctivae and lids are normal.  Neck: No JVD present. Carotid bruit is not present. No edema present. No thyroid mass and no thyromegaly present.  Cardiovascular: S1 normal and S2 normal. Exam reveals no gallop.  No murmur heard. Respiratory: No respiratory distress. He has no wheezes. He has no rhonchi. He has no rales.  GI: Soft. Bowel sounds are normal. There is no abdominal tenderness.  Musculoskeletal:        General: No edema.     Right ankle: He exhibits no swelling.     Left ankle: He exhibits no  swelling.     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  Lymphadenopathy:    He has no cervical adenopathy.  Neurological: He is alert and oriented to person, place, and time.  Skin: Skin is warm. Nails show no clubbing.  Stage II decubiti noninfected on the buttock sacral area.  Psychiatric: He has a normal mood and affect.      Data Reviewed: Basic Metabolic Panel: Recent Labs  Lab 03/10/19 0543 03/11/19 0513 03/12/19 0437 03/13/19 0512 03/14/19 0642  NA 135 130* 134* 135 137  K 4.5 4.1 4.2 4.2 4.1  CL 99 95* 99 100 104  CO2 26 26 26 27 26   GLUCOSE 325* 280* 277* 362* 107*  BUN 11 14 13  25* 19  CREATININE 0.64 0.83 0.69 0.76 0.81  CALCIUM 7.9* 7.8* 7.9* 7.8* 7.3*  MG 2.1 2.2 2.2 2.3  --   PHOS 3.2  --   --   --   --    Liver Function Tests: Recent Labs  Lab 03/10/19 0543  AST 14*  ALT 8  ALKPHOS 35*  BILITOT 0.5  PROT 6.4*  ALBUMIN 2.0*   No results for input(s): LIPASE, AMYLASE in the last 168 hours. CBC: Recent Labs  Lab 03/09/19 0422 03/10/19 0543  03/11/19 0513 03/12/19 0437 03/12/19 1632 03/13/19 0512 03/14/19 0642  WBC 17.8* 20.2*  --  21.5* 18.6* 23.6* 33.2* 20.8*  NEUTROABS 12.9* 16.0*  --   --   --   --   --   --   HGB 7.1* 7.2*   < >  8.6* 8.5* 8.3* 8.3* 6.7*  HCT 22.3* 22.3*   < > 26.0* 26.2* 25.2* 24.8* 20.9*  MCV 87.1 88.1  --  86.7 87.9 87.5 87.0 88.2  PLT 469* 550*  --  585* 656* 644* 667* 594*   < > = values in this interval not displayed.   Cardiac Enzymes: No results for input(s): CKTOTAL, CKMB, CKMBINDEX, TROPONINI in the last 168 hours.  CBG: Recent Labs  Lab 03/13/19 1646 03/13/19 2055 03/14/19 0733 03/14/19 0802 03/14/19 1204  GLUCAP 165* 72 107* 97 189*    Recent Results (from the past 240 hour(s))  CULTURE, BLOOD (ROUTINE X 2) w Reflex to ID Panel     Status: None   Collection Time: 03/05/19  9:56 AM  Result Value Ref Range Status   Specimen Description BLOOD LEFT HAND  Final   Special Requests   Final    BOTTLES DRAWN  AEROBIC AND ANAEROBIC Blood Culture results may not be optimal due to an excessive volume of blood received in culture bottles   Culture   Final    NO GROWTH 5 DAYS Performed at [facility], [address] [place], [place] [address]    Report Status 03/10/2019 FINAL  Final  CULTURE, BLOOD (ROUTINE X 2) w Reflex to ID Panel     Status: None   Collection Time: 03/05/19  9:56 AM  Result Value Ref Range Status   Specimen Description BLOOD RIGHT FA  Final   Special Requests   Final    BOTTLES DRAWN AEROBIC AND ANAEROBIC Blood Culture adequate volume   Culture   Final    NO GROWTH 5 DAYS Performed at [facility], [address] [place], [place] [address]    Report Status 03/10/2019 FINAL  Final  CULTURE, BLOOD (ROUTINE X 2) w Reflex to ID Panel     Status: None   Collection Time: 03/09/19 12:55 PM  Result Value Ref Range Status   Specimen Description BLOOD LEFT ARM  Final   Special Requests   Final    BOTTLES DRAWN AEROBIC AND ANAEROBIC Blood Culture adequate volume   Culture   Final    NO GROWTH 5 DAYS Performed at [facility], [address] [place], [place] [address]    Report Status 03/14/2019 FINAL  Final  CULTURE, BLOOD (ROUTINE X 2) w Reflex to ID Panel     Status: None   Collection Time: 03/09/19  1:00 PM  Result Value Ref Range Status   Specimen Description BLOOD LEFT HAND  Final   Special Requests   Final    BOTTLES DRAWN AEROBIC AND ANAEROBIC Blood Culture adequate volume   Culture   Final    NO GROWTH 5 DAYS Performed at [facility], [address], [place], [place] [address]    Report Status 03/14/2019 FINAL  Final  Urine Culture     Status: None   Collection Time: 03/09/19  1:23 PM  Result Value Ref Range Status   Specimen Description   Final    URINE, CLEAN CATCH Performed at [facility], [address], [place], [place] [address]    Special Requests   Final    NONE Performed at [facility],  [address] [place], [place] [address]    Culture   Final    NO GROWTH Performed at [facility], [place] [address], [place],  [address]    Report Status 03/10/2019 FINAL  Final  Studies: No results found.  Scheduled Meds: . sodium chloride   Intravenous Once  . acetaminophen  650 mg Oral Once  . aspirin  81 mg Oral Daily  . enoxaparin (LOVENOX) injection  40 mg Subcutaneous Q24H  . insulin aspart  0-15 Units Subcutaneous TID WC  . insulin aspart  0-5 Units Subcutaneous QHS  . insulin aspart  10 Units Subcutaneous TID WC  . insulin glargine  19 Units Subcutaneous Daily  . mouth rinse  15 mL Mouth Rinse BID  . Ensure Max Protein  11 oz Oral BID BM  . rosuvastatin  40 mg Oral q1800  . sodium chloride flush  10 mL Intravenous Q12H  . ticagrelor  90 mg Oral BID   Continuous Infusions: . sodium chloride 75 mL/hr at 03/13/19 1536  . piperacillin-tazobactam (ZOSYN)  IV 3.375 g (03/14/19 0919)    Assessment/Plan:  1.  Sepsis with Proteus vulgaris bacteremia.  Likely the gangrenous toes as the source.  Demarcation starting to go up to the left foot.  Patient with low-grade temperature.  Repeat blood cultures so far negative.  Patient on Rocephin.  Patient has poor circulation and would need a revascularization procedure prior to amputation in order for it to heal.  Patient prefers surgery at this time. Changed from Rocephin to Zosyn per Dr. Arlyss Repress. Better leukocytosis, repeated blood culture is negative so far, follow up CBC.   Continue Zosyn until leukocytosis improves per Dr. Percell Boston.  2.  Severe peripheral vascular disease.  Angiogram showing poor blood supply to the lower extremities. S/P L CFA endarterectomy,  S/p Left below-the-knee amputation by Dr. Lucky Cowboy.  3.  Acute hypoxic respiratory failure.  Patient tapered off oxygen. 4.  Acute myocardial infarction.  Conservative management as per cardiology.  Patient on aspirin.  Hold off on Brilinta because  the patient may need a amputation.  High-dose Crestor started. 5.  Hyperglycemia due to type 2 diabetes mellitus.   Increased Lantus to 19 units with sliding scale. Added novolog 10 unit AC.  Blood sugars better controlled. 6.  Acute kidney injury normalized with IV fluids. Dehydration.  Improved with IVF. 7.  Cocaine abuse  8.  Chronic atrial fibrillation.  Aspirin and resume Brilinta per Dr.Dew. 9.  History of pancreatic cancer status post Whipple procedure in 2018 10.  Hypernatremia.  This has normalized. 11.  Anemia of chronic disease.  Patient has received 2 units of packed red blood cells. Hb down to 7.2. no active bleeding.  Up to 8.6 after 1 unit PRBC transfusion. Hemoglobin down to 6.7.  PRBC transfusion 1 unit today.  Follow-up hemoglobin. 12.  2 stage II decubitus on buttocks.  Local wound care.  Code Status:     Code Status Orders  (From admission, onward)         Start     Ordered   03/02/19 1454  Full code  Continuous     03/02/19 1454        Code Status History    Date Active Date Inactive Code Status Order ID Comments User Context   02/28/2019 1449 03/01/2019 0836 Full Code [id number]  Gorden Harms, MD Inpatient   02/28/2019 1449 02/28/2019 1449 Full Code [id number]  Gorden Harms, MD Inpatient   11/11/2018 0758 11/18/2018 1814 DNR [id number]  Loletha Grayer, MD ED   09/20/2018 1635 09/24/2018 1854 Full Code [id number]  Fritzi Mandes, MD ED   08/03/2018 1020 08/04/2018 1813 Full Code [id number]  Salary, Avel Peace, MD ED  01/09/2018 1025 01/11/2018 1932 Full Code [id number]  Harrie Foreman, MD Inpatient   09/05/2017 1559 09/11/2017 1928 Full Code [id number]  Fritzi Mandes, MD Inpatient   08/24/2017 0238 08/29/2017 2131 Full Code [id number]  Lance Coon, MD Inpatient   08/10/2015 0822 08/11/2015 1947 Full Code [id number]  Harrie Foreman, MD Inpatient     Disposition Plan: TBD  Consultants:  Critical care specialist  Cardiology  Palliative care  Vascular  surgery  Podiatry  Antibiotics:  Rocephin  Time spent: 30 minutes.  Spoke with patient's significant other Ms. Lewis on the phone. [name]  [employer]

## 2019-03-14 NOTE — Progress Notes (Signed)
Patient resting in bed. Pain meds given with some relief. Dressing changed on IJ line. No needs at this time. Continue to monitor.

## 2019-03-14 NOTE — Plan of Care (Signed)
  Problem: Education: Goal: Ability to describe self-care measures that may prevent or decrease complications (Diabetes Survival Skills Education) will improve Outcome: Progressing Goal: Individualized Educational Video(s) Outcome: Progressing   Problem: Cardiac: Goal: Ability to maintain an adequate cardiac output will improve Outcome: Progressing   Problem: Health Behavior/Discharge Planning: Goal: Ability to identify and utilize available resources and services will improve Outcome: Progressing Goal: Ability to manage health-related needs will improve Outcome: Progressing   Problem: Fluid Volume: Goal: Ability to achieve a balanced intake and output will improve Outcome: Progressing   Problem: Metabolic: Goal: Ability to maintain appropriate glucose levels will improve Outcome: Progressing   Problem: Nutritional: Goal: Maintenance of adequate nutrition will improve Outcome: Progressing Goal: Maintenance of adequate weight for body size and type will improve Outcome: Progressing   Problem: Respiratory: Goal: Will regain and/or maintain adequate ventilation Outcome: Progressing   Problem: Urinary Elimination: Goal: Ability to achieve and maintain adequate renal perfusion and functioning will improve Outcome: Progressing   Problem: Education: Goal: Knowledge of General Education information will improve Description: Including pain rating scale, medication(s)/side effects and non-pharmacologic comfort measures Outcome: Progressing   Problem: Health Behavior/Discharge Planning: Goal: Ability to manage health-related needs will improve Outcome: Progressing   Problem: Clinical Measurements: Goal: Ability to maintain clinical measurements within normal limits will improve Outcome: Progressing Goal: Will remain free from infection Outcome: Progressing Goal: Diagnostic test results will improve Outcome: Progressing Goal: Respiratory complications will improve Outcome:  Progressing Goal: Cardiovascular complication will be avoided Outcome: Progressing   Problem: Activity: Goal: Risk for activity intolerance will decrease Outcome: Progressing   Problem: Nutrition: Goal: Adequate nutrition will be maintained Outcome: Progressing   Problem: Coping: Goal: Level of anxiety will decrease Outcome: Progressing   Problem: Elimination: Goal: Will not experience complications related to bowel motility Outcome: Progressing Goal: Will not experience complications related to urinary retention Outcome: Progressing   Problem: Pain Managment: Goal: General experience of comfort will improve Outcome: Progressing   Problem: Safety: Goal: Ability to remain free from injury will improve Outcome: Progressing   Problem: Skin Integrity: Goal: Risk for impaired skin integrity will decrease Outcome: Progressing   

## 2019-03-14 NOTE — Plan of Care (Signed)
  Problem: Education: Goal: Ability to describe self-care measures that may prevent or decrease complications (Diabetes Survival Skills Education) will improve Outcome: Progressing Goal: Individualized Educational Video(s) Outcome: Progressing   Problem: Cardiac: Goal: Ability to maintain an adequate cardiac output will improve Outcome: Progressing   Problem: Health Behavior/Discharge Planning: Goal: Ability to identify and utilize available resources and services will improve Outcome: Progressing Goal: Ability to manage health-related needs will improve Outcome: Progressing   Problem: Fluid Volume: Goal: Ability to achieve a balanced intake and output will improve Outcome: Progressing   Problem: Metabolic: Goal: Ability to maintain appropriate glucose levels will improve Outcome: Progressing   Problem: Nutritional: Goal: Maintenance of adequate nutrition will improve Outcome: Progressing Goal: Maintenance of adequate weight for body size and type will improve Outcome: Progressing   Problem: Respiratory: Goal: Will regain and/or maintain adequate ventilation Outcome: Progressing   Problem: Urinary Elimination: Goal: Ability to achieve and maintain adequate renal perfusion and functioning will improve Outcome: Progressing   Problem: Education: Goal: Knowledge of General Education information will improve Description Including pain rating scale, medication(s)/side effects and non-pharmacologic comfort measures Outcome: Progressing   Problem: Health Behavior/Discharge Planning: Goal: Ability to manage health-related needs will improve Outcome: Progressing   Problem: Clinical Measurements: Goal: Ability to maintain clinical measurements within normal limits will improve Outcome: Progressing Goal: Will remain free from infection Outcome: Progressing Goal: Diagnostic test results will improve Outcome: Progressing Goal: Respiratory complications will improve Outcome:  Progressing Goal: Cardiovascular complication will be avoided Outcome: Progressing   Problem: Activity: Goal: Risk for activity intolerance will decrease Outcome: Progressing   Problem: Nutrition: Goal: Adequate nutrition will be maintained Outcome: Progressing   Problem: Elimination: Goal: Will not experience complications related to bowel motility Outcome: Progressing Goal: Will not experience complications related to urinary retention Outcome: Progressing   Problem: Pain Managment: Goal: General experience of comfort will improve Outcome: Progressing   Problem: Safety: Goal: Ability to remain free from injury will improve Outcome: Progressing   Problem: Skin Integrity: Goal: Risk for impaired skin integrity will decrease Outcome: Progressing

## 2019-03-14 NOTE — Progress Notes (Signed)
OT Cancellation Note  Patient Details Name: Richard [name] MRN: [id number] DOB: 15-Jun-1956   Cancelled Treatment:    Reason Eval/Treat Not Completed: Medical issues which prohibited therapy. Pt noted with a Hgb of 6.7, falling outside the guidelines for participation in therapy. Will continue to follow acutely and re-attempt at later date/time as medically appropriate.   Jeni Salles, MPH, MS, OTR/L ascom [phone] 03/14/19, 8:40 AM

## 2019-03-14 NOTE — Progress Notes (Signed)
Patient stating 10/10 pain on right lower extremity after prn dilaudid 1mg  given. Received verbal order from Gardiner Barefoot NP for one time dose of dilaudid 1mg .

## 2019-03-14 NOTE — Consult Note (Signed)
[facility] Face-to-Face Psychiatry Consult   Reason for Consult:  Substance use Referring Physician:  Dr. Bridgett Larsson Patient Identification: Richard [name] MRN:  [id number] Principal Diagnosis: <principal problem not specified> Diagnosis:  Active Problems:   Respiratory failure ([place])   Pressure injury of skin   Total Time spent with patient: 30 minutes  Subjective:   Richard [name] is a 63 y.o. 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 Psychiatry consult requested to address substance use per palliative NP recommendation.  Patient seen. Chart reviewed.  Patient currently identifies her mood as "okay", denies feeling depressed, hopeless. He realizes that he underwent BKA and says "I will be able to deal with that". He reports that his family is supportive. He denies passive death wishes and any active thoughts of harming self or others. He denies past suicidal attempts. He reports they have no guns at home. Patient denies high anxiety, denies panic attacks. Patient denies any current or past symptoms of mania, such as increased energy, feeling irritable, easily distractible, unusual talkativeness. Denies decreased need for sleep. Patient denies any current or past hallucinations, illusions. Patient does not express any delusions. Patient reports feeling safe in their environment. Reports occasionally poor sleep due to pain.  Patient admits to cocaine abuse prior to hospital admission. He says he used as much as he can "to medicate self for pain"/ He reports that he "dont need that anymore as they took that painful leg". He denies current cravings to use cocaine. He denies any other substance use. He reports that he "will deal with that on my own. It is nothing to deal with anymore". Patient refuses any chemical-dependency resources and referrals.  Patient vocalizes "no questions, no  concerns, no problems" for psychiatry.   Past Psychiatric History: he denies past psych diagnoses, past psych admissions, past suicidal attempts, taking any psych medications.  Social Hx: patient reports he lives with significant other. Has children, Not working, Denies legal history. Denies access to firearms.  Risk to Self:  low Risk to Others:  low Prior Inpatient Therapy:  denies Prior Outpatient Therapy:  denies  Past Medical History:  Past Medical History:  Diagnosis Date  . Atrial fibrillation ([place])    per patient  . Cancer ([place])   . CHF (congestive heart failure) ([place])   . Chronic back pain   . Chronic leg pain   . Coronary artery disease   . Diabetes mellitus without complication ([place])   . Hypertension   . Neuropathy     Past Surgical History:  Procedure Laterality Date  . AMPUTATION Left 03/13/2019   Procedure: AMPUTATION BELOW KNEE;  Surgeon: Algernon Huxley, MD;  Location: [facility] ORS;  Service: Vascular;  Laterality: Left;  . CORONARY ANGIOPLASTY WITH STENT PLACEMENT    . EMBOLECTOMY Left 03/12/2019   Procedure: EMBOLECTOMY SFA POPLITEAL;  Surgeon: Algernon Huxley, MD;  Location: [facility] ORS;  Service: Vascular;  Laterality: Left;  . ENDARTERECTOMY FEMORAL Left 03/12/2019   Procedure: ENDARTERECTOMY FEMORAL;  Surgeon: Algernon Huxley, MD;  Location: [facility] ORS;  Service: Vascular;  Laterality: Left;  . ESOPHAGOGASTRODUODENOSCOPY N/A 09/11/2017   Procedure: ESOPHAGOGASTRODUODENOSCOPY (EGD);  Surgeon: Virgel Manifold, MD;  Location: [facility] ENDOSCOPY;  Service: Endoscopy;  Laterality: N/A;  . LOWER EXTREMITY ANGIOGRAPHY Left 11/17/2018   Procedure: Lower Extremity Angiography, with possible intervention;  Surgeon: Algernon Huxley, MD;  Location: [place] CV LAB;  Service: Cardiovascular;  Laterality: Left;  . LOWER EXTREMITY ANGIOGRAPHY Left 03/04/2019   Procedure: Lower Extremity Angiography;  Surgeon: Algernon Huxley, MD;  Location: [place] CV LAB;  Service: Cardiovascular;   Laterality: Left;  . pancreas removed     Family History:  Family History  Problem Relation Age of Onset  . CAD Mother   . Diabetes Mellitus II Mother   . Diabetes Mellitus II Father    Family Psychiatric  History: denies any psych history and suicides in family members.  Social History: see above. Social History   Substance and Sexual Activity  Alcohol Use No  . Alcohol/week: 0.0 standard drinks     Social History   Substance and Sexual Activity  Drug Use Yes  . Types: Cocaine   Comment: Has used in the past-heroin. Cocaine used over a year ago per pt.    Social History   Socioeconomic History  . Marital status: Single    Spouse name: Not on file  . Number of children: Not on file  . Years of education: Not on file  . Highest education level: Not on file  Occupational History  . Not on file  Social Needs  . Financial resource strain: Not on file  . Food insecurity:    Worry: Not on file    Inability: Not on file  . Transportation needs:    Medical: Not on file    Non-medical: Not on file  Tobacco Use  . Smoking status: Current Every Day Smoker    Packs/day: 0.50  . Smokeless tobacco: Never Used  Substance and Sexual Activity  . Alcohol use: No    Alcohol/week: 0.0 standard drinks  . 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 Cocaine used over a year ago per pt.  . Sexual activity: Not on file  Lifestyle  . Physical activity:    Days per week: Not on file    Minutes per session: Not on file  . Stress: Not on file  Relationships  . Social connections:    Talks on phone: Not on file    Gets together: Not on file    Attends religious service: Not on file    Active member of club or organization: Not on file    Attends meetings of clubs or organizations: Not on file    Relationship status: Not on file  Other Topics Concern  . Not on file  Social History Narrative  . Not on file   Additional Social History:     Allergies:  No Known Allergies  Labs:  Results for orders placed or performed during the hospital encounter of 02/28/19 (from the past 48 hour(s))  Glucose, capillary     Status: Abnormal   Collection Time: 03/12/19 10:37 AM  Result Value Ref Range   Glucose-Capillary 294 (H) 70 - 99 mg/dL  Glucose, capillary     Status: Abnormal   Collection Time: 03/12/19 11:33 AM  Result Value Ref Range   Glucose-Capillary 305 (H) 70 - 99 mg/dL  Prepare RBC     Status: None   Collection Time: 03/12/19 12:10 PM  Result Value Ref Range   Order Confirmation      ORDER PROCESSED BY BLOOD BANK Performed at [facility], [address] [place], [place] [address]   Glucose, capillary     Status: Abnormal   Collection Time: 03/12/19 12:38 PM  Result Value Ref Range   Glucose-Capillary 249 (H) 70 - 99 mg/dL  CBC     Status: Abnormal   Collection Time: 03/12/19  4:32 PM  Result Value Ref Range   WBC 23.6 (H) 4.0 - 10.5 K/uL   RBC 2.88 (L) 4.22 - 5.81 MIL/uL   Hemoglobin 8.3 (L) 13.0 - 17.0 g/dL   HCT 25.2 (L) 39.0 - 52.0 %   MCV 87.5 80.0 - 100.0 fL   MCH 28.8 26.0 - 34.0 pg   MCHC 32.9 30.0 - 36.0 g/dL   RDW 15.2 11.5 - 15.5 %   Platelets 644 (H) 150 - 400 K/uL   nRBC 0.0 0.0 - 0.2 %    Comment: Performed at [facility], [address] [place], [place] [address]  Glucose, capillary     Status: Abnormal   Collection Time: 03/12/19  4:43 PM  Result Value Ref Range   Glucose-Capillary 202 (H) 70 - 99 mg/dL   Comment 1 Notify RN   Glucose, capillary     Status: Abnormal   Collection Time: 03/12/19  9:15 PM  Result Value Ref Range   Glucose-Capillary 243 (H) 70 - 99 mg/dL  Glucose, capillary     Status: Abnormal   Collection Time: 03/13/19 12:30 AM  Result Value Ref Range   Glucose-Capillary 330 (H) 70 - 99 mg/dL  CBC     Status: Abnormal   Collection Time: 03/13/19  5:12 AM  Result Value Ref Range   WBC 33.2 (H) 4.0 - 10.5 K/uL   RBC 2.85 (L) 4.22 - 5.81 MIL/uL    Hemoglobin 8.3 (L) 13.0 - 17.0 g/dL   HCT 24.8 (L) 39.0 - 52.0 %   MCV 87.0 80.0 - 100.0 fL   MCH 29.1 26.0 - 34.0 pg   MCHC 33.5 30.0 - 36.0 g/dL   RDW 15.3 11.5 - 15.5 %   Platelets 667 (H) 150 - 400 K/uL   nRBC 0.0 0.0 - 0.2 %    Comment: Performed at [facility], [address] [place], [place] [address]  Basic metabolic panel     Status: Abnormal   Collection Time: 03/13/19  5:12 AM  Result Value Ref Range   Sodium 135 135 - 145 mmol/L   Potassium 4.2 3.5 - 5.1 mmol/L   Chloride 100 98 - 111 mmol/L   CO2 27 22 - 32 mmol/L   Glucose, Bld 362 (H) 70 - 99 mg/dL   BUN 25 (H) 8 - 23 mg/dL   Creatinine, Ser 0.76 0.61 - 1.24 mg/dL   Calcium 7.8 (L) 8.9 - 10.3 mg/dL   GFR calc non Af Amer >60 >60 mL/min   GFR calc Af Amer >60 >60 mL/min   Anion gap 8 5 - 15    Comment: Performed at [facility], [address] [place],  [address]  Magnesium     Status: None   Collection Time: 03/13/19  5:12 AM  Result Value Ref Range   Magnesium 2.3 1.7 - 2.4 mg/dL    Comment: Performed at [facility], [address] [place], [place] [address]  Glucose, capillary     Status: Abnormal   Collection Time: 03/13/19  8:02 AM  Result Value Ref Range   Glucose-Capillary 322 (H) 70 - 99 mg/dL  Glucose, capillary     Status: Abnormal   Collection Time: 03/13/19 11:55 AM  Result Value Ref Range   Glucose-Capillary 192 (H) 70 - 99 mg/dL  Glucose,  capillary     Status: Abnormal   Collection Time: 03/13/19  2:03 PM  Result Value Ref Range   Glucose-Capillary 157 (H) 70 - 99 mg/dL  Glucose, capillary     Status: Abnormal   Collection Time: 03/13/19  4:46 PM  Result Value Ref Range   Glucose-Capillary 165 (H) 70 - 99 mg/dL  Glucose, capillary     Status: None   Collection Time: 03/13/19  8:55 PM  Result Value Ref Range   Glucose-Capillary 72 70 - 99 mg/dL  CBC     Status: Abnormal   Collection Time: 03/14/19  6:42 AM  Result Value Ref Range   WBC 20.8 (H)  4.0 - 10.5 K/uL   RBC 2.37 (L) 4.22 - 5.81 MIL/uL   Hemoglobin 6.7 (L) 13.0 - 17.0 g/dL   HCT 20.9 (L) 39.0 - 52.0 %   MCV 88.2 80.0 - 100.0 fL   MCH 28.3 26.0 - 34.0 pg   MCHC 32.1 30.0 - 36.0 g/dL   RDW 15.9 (H) 11.5 - 15.5 %   Platelets 594 (H) 150 - 400 K/uL   nRBC 0.1 0.0 - 0.2 %    Comment: Performed at [facility], [address] [place], [place] [address]  Basic metabolic panel     Status: Abnormal   Collection Time: 03/14/19  6:42 AM  Result Value Ref Range   Sodium 137 135 - 145 mmol/L   Potassium 4.1 3.5 - 5.1 mmol/L   Chloride 104 98 - 111 mmol/L   CO2 26 22 - 32 mmol/L   Glucose, Bld 107 (H) 70 - 99 mg/dL   BUN 19 8 - 23 mg/dL   Creatinine, Ser 0.81 0.61 - 1.24 mg/dL   Calcium 7.3 (L) 8.9 - 10.3 mg/dL   GFR calc non Af Amer >60 >60 mL/min   GFR calc Af Amer >60 >60 mL/min   Anion gap 7 5 - 15    Comment: Performed at [facility], [address] [place], [place] [address]  Glucose, capillary     Status: Abnormal   Collection Time: 03/14/19  7:33 AM  Result Value Ref Range   Glucose-Capillary 107 (H) 70 - 99 mg/dL  Glucose, capillary     Status: None   Collection Time: 03/14/19  8:02 AM  Result Value Ref Range   Glucose-Capillary 97 70 - 99 mg/dL   Comment 1 Notify RN     Current Facility-Administered Medications  Medication Dose Route Frequency Provider Last Rate Last Dose  . 0.9 %  sodium chloride infusion   Intravenous Continuous Algernon Huxley, MD 75 mL/hr at 03/13/19 1536    . acetaminophen (TYLENOL) tablet 650 mg  650 mg Oral Q6H PRN Algernon Huxley, MD   650 mg at 03/08/19 2105  . aspirin chewable tablet 81 mg  81 mg Oral Daily Algernon Huxley, MD   81 mg at 03/14/19 2482  . enoxaparin (LOVENOX) injection 40 mg  40 mg Subcutaneous Q24H Algernon Huxley, MD   40 mg at 03/14/19 0917  . HYDROmorphone (DILAUDID) injection 1 mg  1 mg Intravenous Q4H PRN Algernon Huxley, MD   1 mg at 03/14/19 0650  . insulin aspart (novoLOG) injection 0-15 Units  0-15  Units Subcutaneous TID WC Algernon Huxley, MD   3 Units at 03/13/19 1701  . insulin aspart (novoLOG) injection 0-5 Units  0-5 Units Subcutaneous QHS Algernon Huxley, MD      . insulin aspart (  novoLOG) injection 10 Units  10 Units Subcutaneous TID WC Algernon Huxley, MD   10 Units at 03/13/19 1701  . insulin glargine (LANTUS) injection 19 Units  19 Units Subcutaneous Daily Algernon Huxley, MD   19 Units at 03/14/19 [phone]  . MEDLINE mouth rinse  15 mL Mouth Rinse BID Algernon Huxley, MD   15 mL at 03/14/19 0920  . naloxone [facility]) injection 0.4 mg  0.4 mg Intravenous PRN Algernon Huxley, MD      . ondansetron [facility]) injection 4 mg  4 mg Intravenous Q6H PRN Algernon Huxley, MD   4 mg at 03/12/19 5366  . oxyCODONE-acetaminophen (PERCOCET/ROXICET) 5-325 MG per tablet 1-2 tablet  1-2 tablet Oral Q4H PRN Algernon Huxley, MD      . piperacillin-tazobactam (ZOSYN) IVPB 3.375 g  3.375 g Intravenous Q8H Algernon Huxley, MD 12.5 mL/hr at 03/14/19 0919 3.375 g at 03/14/19 0919  . protein supplement (ENSURE MAX) liquid  11 oz Oral BID BM Algernon Huxley, MD   11 oz at 03/14/19 0920  . rosuvastatin (CRESTOR) tablet 40 mg  40 mg Oral q1800 Algernon Huxley, MD   40 mg at 03/13/19 1705  . sodium chloride flush (NS) 0.9 % injection 10 mL  10 mL Intravenous Q12H Algernon Huxley, MD   10 mL at 03/14/19 0918  . sodium chloride flush (NS) 0.9 % injection 10 mL  10 mL Intravenous PRN Algernon Huxley, MD   10 mL at 03/06/19 1816  . ticagrelor (BRILINTA) tablet 90 mg  90 mg Oral BID [name], MD         Psychiatric Specialty Exam: Physical Exam  Review of Systems  Psychiatric/Behavioral: Positive for substance abuse. Negative for depression, hallucinations, memory loss and suicidal ideas. The patient is not nervous/anxious and does not have insomnia.     Blood pressure 112/72, pulse 79, temperature 98.2 F (36.8 C), temperature source Oral, resp. rate 18, height 5\' 5"  (1.651 m), weight 57.3 kg, SpO2 100 %.Body mass index is 21.02 kg/m.  General  Appearance: Casual  Eye Contact:  Good  Speech:  Normal Rate  Volume:  Normal  Mood:  Euthymic  Affect:  Full Range  Thought Process:  Coherent and Goal Directed  Orientation:  Full (Time, Place, and Person)  Thought Content:  Logical  Suicidal Thoughts:  No  Homicidal Thoughts:  No  Memory:  Immediate;   Fair Recent;   Fair Remote;   Fair  Judgement:  Fair  Insight:  Fair  Psychomotor Activity:  Normal  Concentration:  Concentration: Fair and Attention Span: Fair  Recall:  [employer] of Knowledge:  Fair  Language:  Fair  Akathisia:  No  Handed:  Right  AIMS (if indicated):     Assets:  Communication Skills Desire for Improvement Housing Social Support  ADL's:  Intact  Cognition:  WNL  Sleep:        Treatment Plan Summary: Plan no indication for inpatient psych admission  Disposition: No evidence of imminent risk to self or others at present.   Patient does not meet criteria for psychiatric inpatient admission. Supportive therapy provided about ongoing stressors.   Assessment: Richard [name] is a 63 y.o. 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 Psychiatry consult requested to address substance use per palliative NP recommendation.  Impression: Cocaine use disorder, moderate.  Patient denies feeling depressed, anxious, suicidal, homicidal, he does not appear psychotic, gravely-disabled or at risk to harm self/others, soe he does not meet criteria for inpatient psych admission. Regarding substance use, patient is in precontemplative stage and does not see his cocaine use as a problem, possibly minimizes his substance use. He refuses any chem-dependency referrals, resources and is not interested in treatment at this time. Patient notified that resources can be provided if he changes his mind. Patient expressed her agreement and understanding to  the same.   Larita Fife, MD 03/14/2019 10:09 AM

## 2019-03-14 NOTE — Progress Notes (Signed)
Subjective  - POD #1  C/o mild pain   Physical Exam:  Left BKA dressing intact and dry Groin dressing intact       Assessment/Plan:  POD #1  Continue with pain control Mobilize Appreciate hospitalists assistance   Richard [name] 03/14/2019 6:29 PM --  Vitals:   03/14/19 1345 03/14/19 1713  BP: (!) 144/77 137/82  Pulse: 100 91  Resp: 16   Temp: 98.7 F (37.1 C)   SpO2: 100% 100%    Intake/Output Summary (Last 24 hours) at 03/14/2019 1829 Last data filed at 03/14/2019 1800 Gross per 24 hour  Intake 1247 ml  Output 550 ml  Net 697 ml     Laboratory CBC    Component Value Date/Time   WBC 20.8 (H) 03/14/2019 0642   HGB 6.7 (L) 03/14/2019 0642   HCT 20.9 (L) 03/14/2019 0642   PLT 594 (H) 03/14/2019 0642    BMET    Component Value Date/Time   NA 137 03/14/2019 0642   K 4.1 03/14/2019 0642   CL 104 03/14/2019 0642   CO2 26 03/14/2019 0642   GLUCOSE 107 (H) 03/14/2019 0642   BUN 19 03/14/2019 0642   CREATININE 0.81 03/14/2019 0642   CALCIUM 7.3 (L) 03/14/2019 0642   GFRNONAA >60 03/14/2019 0642   GFRAA >60 03/14/2019 0642    COAG Lab Results  Component Value Date   INR 1.2 03/12/2019   INR 1.2 03/09/2019   INR 1.5 (H) 02/28/2019   No results found for: PTT  Antibiotics Anti-infectives (From admission, onward)   Start     Dose/Rate Route Frequency Ordered Stop   03/10/19 1645  piperacillin-tazobactam (ZOSYN) IVPB 3.375 g  Status:  Discontinued     3.375 g 100 mL/hr over 30 Minutes Intravenous Every 8 hours 03/10/19 1633 03/10/19 1639   03/10/19 1645  piperacillin-tazobactam (ZOSYN) IVPB 3.375 g     3.375 g 12.5 mL/hr over 240 Minutes Intravenous Every 8 hours 03/10/19 1639     03/09/19 0600  ceFAZolin (ANCEF) IVPB 2g/100 mL premix  Status:  Discontinued     2 g 200 mL/hr over 30 Minutes Intravenous On call to O.R. 03/08/19 2326 03/08/19 2331   03/08/19 2326  ceFAZolin (ANCEF) IVPB 2g/100 mL premix  Status:  Discontinued     2 g 200  mL/hr over 30 Minutes Intravenous 30 min pre-op 03/08/19 2326 03/09/19 0940   03/08/19 2100  cefTRIAXone (ROCEPHIN) 2 g in sodium chloride 0.9 % 100 mL IVPB  Status:  Discontinued     2 g 200 mL/hr over 30 Minutes Intravenous Every 24 hours 03/08/19 2048 03/10/19 1633   03/04/19 1214  ceFAZolin (ANCEF) 2-4 GM/100ML-% IVPB    Note to Pharmacy:  Despina Arias  : cabinet override      03/04/19 1214 03/04/19 1900   03/04/19 1210  ceFAZolin (ANCEF) IVPB 2g/100 mL premix  Status:  Discontinued     2 g 200 mL/hr over 30 Minutes Intravenous 30 min pre-op 03/04/19 1211 03/04/19 1653   03/03/19 1800  cefTRIAXone (ROCEPHIN) 1 g in sodium chloride 0.9 % 100 mL IVPB  Status:  Discontinued     1 g 200 mL/hr over 30 Minutes Intravenous Every 24 hours 03/03/19 1217 03/03/19 1230   03/03/19 1800  cefTRIAXone (ROCEPHIN) 2 g in sodium chloride 0.9 % 100 mL IVPB     2 g 200 mL/hr over 30 Minutes Intravenous Every 24 hours 03/03/19 1230 03/07/19 1739   03/02/19 1100  meropenem (  MERREM) 1 g in sodium chloride 0.9 % 100 mL IVPB  Status:  Discontinued     1 g 200 mL/hr over 30 Minutes Intravenous Every 8 hours 03/02/19 1035 03/03/19 1217   03/01/19 1000  ceFEPIme (MAXIPIME) 2 g in sodium chloride 0.9 % 100 mL IVPB  Status:  Discontinued    Note to Pharmacy:  Pharmacy to dose   2 g 200 mL/hr over 30 Minutes Intravenous Every 12 hours 03/01/19 0742 03/01/19 0836   03/01/19 0800  vancomycin (VANCOCIN) IVPB 1000 mg/200 mL premix  Status:  Discontinued     1,000 mg 200 mL/hr over 60 Minutes Intravenous Every 36 hours 03/01/19 0735 03/01/19 0836   02/28/19 2200  ceFEPIme (MAXIPIME) 1 g in sodium chloride 0.9 % 100 mL IVPB  Status:  Discontinued    Note to Pharmacy:  Pharmacy to dose   1 g 200 mL/hr over 30 Minutes Intravenous Every 12 hours 02/28/19 1321 03/01/19 0742   02/28/19 1352  vancomycin variable dose per unstable renal function (pharmacist dosing)  Status:  Discontinued      Does not apply See admin  instructions 02/28/19 1354 03/01/19 0735   02/28/19 1330  vancomycin (VANCOCIN) IVPB 1000 mg/200 mL premix  Status:  Discontinued    Note to Pharmacy:  Pharmacy to dose   1,000 mg 200 mL/hr over 60 Minutes Intravenous  Once 02/28/19 1321 02/28/19 1354   02/28/19 0900  ceFEPIme (MAXIPIME) 2 g in sodium chloride 0.9 % 100 mL IVPB     2 g 200 mL/hr over 30 Minutes Intravenous  Once 02/28/19 0856 02/28/19 1100   02/28/19 0900  vancomycin (VANCOCIN) IVPB 1000 mg/200 mL premix     1,000 mg 200 mL/hr over 60 Minutes Intravenous  Once 02/28/19 0856 02/28/19 1143       V. Leia Alf, M.D., [facility] [place] Office: [phone] Pager:  [phone]

## 2019-03-14 NOTE — Progress Notes (Signed)
PT Cancellation Note  Patient Details Name: Richard [name] MRN: [id number] DOB: 1956/01/08   Cancelled Treatment:    Reason Eval/Treat Not Completed: Medical issues which prohibited therapy.  PT consult received.  Pt's Hgb noted to be decreased to 6.7 this morning.  Per PT guidelines for low Hgb, will hold PT at this time and re-attempt PT evaluation at a later date/time as medically appropriate.  Leitha Bleak, PT 03/14/19, 9:59 AM [phone]

## 2019-03-15 LAB — BPAM RBC
Blood Product Expiration Date: 202006192359
Blood Product Expiration Date: 202006232359
Blood Product Expiration Date: 202006232359
ISSUE DATE / TIME: 202005231327
Unit Type and Rh: 5100
Unit Type and Rh: 5100
Unit Type and Rh: 5100

## 2019-03-15 LAB — CBC
HCT: 24.9 % — ABNORMAL LOW (ref 39.0–52.0)
Hemoglobin: 8.3 g/dL — ABNORMAL LOW (ref 13.0–17.0)
MCH: 28.7 pg (ref 26.0–34.0)
MCHC: 33.3 g/dL (ref 30.0–36.0)
MCV: 86.2 fL (ref 80.0–100.0)
Platelets: 598 10*3/uL — ABNORMAL HIGH (ref 150–400)
RBC: 2.89 MIL/uL — ABNORMAL LOW (ref 4.22–5.81)
RDW: 15.6 % — ABNORMAL HIGH (ref 11.5–15.5)
WBC: 23 10*3/uL — ABNORMAL HIGH (ref 4.0–10.5)
nRBC: 0.2 % (ref 0.0–0.2)

## 2019-03-15 LAB — TYPE AND SCREEN
ABO/RH(D): O POS
Antibody Screen: NEGATIVE
Unit division: 0
Unit division: 0
Unit division: 0

## 2019-03-15 LAB — GLUCOSE, CAPILLARY
Glucose-Capillary: 184 mg/dL — ABNORMAL HIGH (ref 70–99)
Glucose-Capillary: 162 mg/dL — ABNORMAL HIGH (ref 70–99)
Glucose-Capillary: 63 mg/dL — ABNORMAL LOW (ref 70–99)
Glucose-Capillary: 69 mg/dL — ABNORMAL LOW (ref 70–99)
Glucose-Capillary: 104 mg/dL — ABNORMAL HIGH (ref 70–99)
Glucose-Capillary: 165 mg/dL — ABNORMAL HIGH (ref 70–99)

## 2019-03-15 NOTE — Progress Notes (Signed)
Patient ID: Richard [name], male   DOB: [birth year], 63 y.o.   MRN: [id number]   [employer] [facility] PROGRESS NOTE  Richard [name] [id number]:[id number] DOB: August 22, 1956 DOA: 02/28/2019 PCP: [facility]  HPI/Subjective: Patient complains left BKA site pain 9 out of 10 this morning.  He had fever 101.1 this morning. Objective: Vitals:   03/15/19 0754 03/15/19 0927  BP: 133/71   Pulse: 98   Resp:    Temp: (!) 101.1 F (38.4 C) 98.2 F (36.8 C)  SpO2: 98%     Filed Weights   02/28/19 1443 03/01/19 0416 03/04/19 1248  Weight: 55 kg 57.3 kg 57.3 kg    ROS: Review of Systems  Constitutional: Positive for malaise/fatigue. Negative for chills and fever.  HENT: Negative for congestion and sore throat.   Eyes: Negative for blurred vision.  Respiratory: Negative for cough and shortness of breath.   Cardiovascular: Negative for chest pain.  Gastrointestinal: Negative for abdominal pain, constipation, diarrhea, nausea and vomiting.  Genitourinary: Negative for dysuria.  Musculoskeletal: Negative for joint pain.       Left leg pain.  Skin: Negative for rash.  Neurological: Negative for dizziness and headaches.  Psychiatric/Behavioral: Negative for depression. The patient is not nervous/anxious.    Exam: Physical Exam  Constitutional: He is oriented to person, place, and time.  HENT:  Nose: No mucosal edema.  Mouth/Throat: No oropharyngeal exudate or posterior oropharyngeal edema.  Eyes: Pupils are equal, round, and reactive to light. Conjunctivae and lids are normal.  Neck: No JVD present. Carotid bruit is not present. No edema present. No thyroid mass and no thyromegaly present.  Cardiovascular: S1 normal and S2 normal. Exam reveals no gallop.  No murmur heard. Respiratory: No respiratory distress. He has no wheezes. He has no rhonchi. He has no rales.  GI: Soft. Bowel sounds are normal. There is no abdominal tenderness.  Musculoskeletal:        General: No edema.      Right ankle: He exhibits no swelling.     Left ankle: He exhibits no swelling.     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  Lymphadenopathy:    He has no cervical adenopathy.  Neurological: He is alert and oriented to person, place, and time.  Skin: Skin is warm. Nails show no clubbing.  Stage II decubiti noninfected on the buttock sacral area.  Psychiatric: He has a normal mood and affect.      Data Reviewed: Basic Metabolic Panel: Recent Labs  Lab 03/10/19 0543 03/11/19 0513 03/12/19 0437 03/13/19 0512 03/14/19 0642  NA 135 130* 134* 135 137  K 4.5 4.1 4.2 4.2 4.1  CL 99 95* 99 100 104  CO2 26 26 26 27 26   GLUCOSE 325* 280* 277* 362* 107*  BUN 11 14 13  25* 19  CREATININE 0.64 0.83 0.69 0.76 0.81  CALCIUM 7.9* 7.8* 7.9* 7.8* 7.3*  MG 2.1 2.2 2.2 2.3  --   PHOS 3.2  --   --   --   --    Liver Function Tests: Recent Labs  Lab 03/10/19 0543  AST 14*  ALT 8  ALKPHOS 35*  BILITOT 0.5  PROT 6.4*  ALBUMIN 2.0*   No results for input(s): LIPASE, AMYLASE in the last 168 hours. CBC: Recent Labs  Lab 03/09/19 0422 03/10/19 0543  03/12/19 0437 03/12/19 1632 03/13/19 0512 03/14/19 0642 03/15/19 0640  WBC 17.8* 20.2*   < > 18.6* 23.6* 33.2* 20.8* 23.0*  NEUTROABS 12.9* 16.0*  --   --   --   --   --   --  HGB 7.1* 7.2*   < > 8.5* 8.3* 8.3* 6.7* 8.3*  HCT 22.3* 22.3*   < > 26.2* 25.2* 24.8* 20.9* 24.9*  MCV 87.1 88.1   < > 87.9 87.5 87.0 88.2 86.2  PLT 469* 550*   < > 656* 644* 667* 594* 598*   < > = values in this interval not displayed.   Cardiac Enzymes: No results for input(s): CKTOTAL, CKMB, CKMBINDEX, TROPONINI in the last 168 hours.  CBG: Recent Labs  Lab 03/13/19 2055 03/14/19 0733 03/14/19 0802 03/14/19 1204 03/14/19 2130  GLUCAP 72 107* 97 189* 108*    Recent Results (from the past 240 hour(s))  CULTURE, BLOOD (ROUTINE X 2) w Reflex to ID Panel     Status: None   Collection Time: 03/09/19 12:55 PM  Result Value Ref Range Status   Specimen  Description BLOOD LEFT ARM  Final   Special Requests   Final    BOTTLES DRAWN AEROBIC AND ANAEROBIC Blood Culture adequate volume   Culture   Final    NO GROWTH 5 DAYS Performed at [facility], [address] [place], [place] [address]    Report Status 03/14/2019 FINAL  Final  CULTURE, BLOOD (ROUTINE X 2) w Reflex to ID Panel     Status: None   Collection Time: 03/09/19  1:00 PM  Result Value Ref Range Status   Specimen Description BLOOD LEFT HAND  Final   Special Requests   Final    BOTTLES DRAWN AEROBIC AND ANAEROBIC Blood Culture adequate volume   Culture   Final    NO GROWTH 5 DAYS Performed at [facility], [address] [place], [place] [address]    Report Status 03/14/2019 FINAL  Final  Urine Culture     Status: None   Collection Time: 03/09/19  1:23 PM  Result Value Ref Range Status   Specimen Description   Final    URINE, CLEAN CATCH Performed at [facility], [address], [place], [place] [address]    Special Requests   Final    NONE Performed at [facility], [address], [place], [place] [address]    Culture   Final    NO GROWTH Performed at [facility], [place] [address] [place], [place] [address]    Report Status 03/10/2019 FINAL  Final     Studies: No results found.  Scheduled Meds: . aspirin  81 mg Oral Daily  . enoxaparin (LOVENOX) injection  40 mg Subcutaneous Q24H  . insulin aspart  0-15 Units Subcutaneous TID WC  . insulin aspart  0-5 Units Subcutaneous QHS  . insulin aspart  10 Units Subcutaneous TID WC  . insulin glargine  19 Units Subcutaneous Daily  . mouth rinse  15 mL Mouth Rinse BID  . Ensure Max Protein  11 oz Oral BID BM  . rosuvastatin  40 mg Oral q1800  . sodium chloride flush  10 mL Intravenous Q12H  . ticagrelor  90 mg Oral BID   Continuous Infusions: . sodium chloride Stopped (03/14/19 1344)  . piperacillin-tazobactam (ZOSYN)  IV 3.375 g (03/15/19 0826)     Assessment/Plan:  1.  Sepsis with Proteus vulgaris bacteremia.  Likely the gangrenous toes as the source.  Demarcation starting to go up to the left foot.  Patient with low-grade temperature.  Repeat blood cultures so far negative.  Patient on Rocephin.  Patient has poor circulation and would need a revascularization procedure prior to amputation in order  for it to heal.  Patient prefers surgery at this time. Changed from Rocephin to Zosyn per Dr. Arlyss Repress. Worsening leukocytosis, repeated blood culture is negative so far, follow up CBC.   Continue Zosyn until leukocytosis improves per Dr. Percell Boston.  2.  Severe peripheral vascular disease.  Angiogram showing poor blood supply to the lower extremities. S/P L CFA endarterectomy,  S/p Left below-the-knee amputation by Dr. Lucky Cowboy.  Pain control.  3.  Acute hypoxic respiratory failure.  Patient tapered off oxygen. 4.  Acute myocardial infarction.  Conservative management as per cardiology.  Patient on aspirin.  Hold off on Brilinta because the patient may need a amputation.  High-dose Crestor started. 5.  Hyperglycemia due to type 2 diabetes mellitus.   Increased Lantus to 19 units with sliding scale. Added novolog 10 unit AC.  Blood sugars better controlled. 6.  Acute kidney injury normalized with IV fluids. Dehydration.  Improved with IVF. 7.  Cocaine abuse  8.  Chronic atrial fibrillation.  Aspirin and resume Brilinta per Dr.Dew. 9.  History of pancreatic cancer status post Whipple procedure in 2018 10.  Hypernatremia.  This has normalized. 11.  Anemia of chronic disease.  Patient has received 2 units of packed red blood cells. Hb down to 7.2. no active bleeding.  Up to 8.6 after 1 unit PRBC transfusion. Hemoglobin down to 6.7.  PRBC transfusion 1 unit, Hb up to 8.3..  Follow-up hemoglobin.  12.  2 stage II decubitus on buttocks.  Local wound care.  Code Status:     Code Status Orders  (From admission, onward)         Start      Ordered   03/02/19 1454  Full code  Continuous     03/02/19 1454        Code Status History    Date Active Date Inactive Code Status Order ID Comments User Context   02/28/2019 1449 03/01/2019 0836 Full Code [id number]  Gorden Harms, MD Inpatient   02/28/2019 1449 02/28/2019 1449 Full Code [id number]  Gorden Harms, MD Inpatient   11/11/2018 0758 11/18/2018 1814 DNR [id number]  Loletha Grayer, MD ED   09/20/2018 1635 09/24/2018 1854 Full Code [id number]  Fritzi Mandes, MD ED   08/03/2018 1020 08/04/2018 1813 Full Code [id number]  Salary, Avel Peace, MD ED   01/09/2018 1025 01/11/2018 1932 Full Code [id number]  Harrie Foreman, MD Inpatient   09/05/2017 1559 09/11/2017 1928 Full Code [id number]  Fritzi Mandes, MD Inpatient   08/24/2017 0238 08/29/2017 2131 Full Code [id number]  Lance Coon, MD Inpatient   08/10/2015 0822 08/11/2015 1947 Full Code [id number]  Harrie Foreman, MD Inpatient     Disposition Plan: TBD  Consultants:  Critical care specialist  Cardiology  Palliative care  Vascular surgery  Podiatry  Antibiotics:  Rocephin  Time spent: 30 minutes.  Spoke with patient's significant other Ms. Lewis on the phone. [name]  [employer]

## 2019-03-15 NOTE — Plan of Care (Signed)
  Problem: Education: Goal: Ability to describe self-care measures that may prevent or decrease complications (Diabetes Survival Skills Education) will improve Outcome: Progressing Goal: Individualized Educational Video(s) Outcome: Progressing   Problem: Cardiac: Goal: Ability to maintain an adequate cardiac output will improve Outcome: Progressing   Problem: Health Behavior/Discharge Planning: Goal: Ability to identify and utilize available resources and services will improve Outcome: Progressing Goal: Ability to manage health-related needs will improve Outcome: Progressing   Problem: Fluid Volume: Goal: Ability to achieve a balanced intake and output will improve Outcome: Progressing   Problem: Metabolic: Goal: Ability to maintain appropriate glucose levels will improve Outcome: Progressing   Problem: Nutritional: Goal: Maintenance of adequate nutrition will improve Outcome: Progressing Goal: Maintenance of adequate weight for body size and type will improve Outcome: Progressing   Problem: Respiratory: Goal: Will regain and/or maintain adequate ventilation Outcome: Progressing

## 2019-03-15 NOTE — Progress Notes (Signed)
PT Cancellation Note  Patient Details Name: Richard [name] MRN: [id number] DOB: Nov 20, 1955   Cancelled Treatment:    Reason Eval/Treat Not Completed: Pain limiting ability to participate(Consult received and chart reviewed.  Patient currently on bedpan and reporting significant pain.  RN informed/aware and working on additional meds.  Patient refusing any attempts at mobility at this time.  Will re-attempt at later time/date as medically appropriate, pain controlled and patient agreeable to participation.)   Burney Calzadilla H. Owens Shark, PT, DPT, NCS 03/15/19, 11:58 AM [phone]

## 2019-03-15 NOTE — Anesthesia Postprocedure Evaluation (Signed)
Anesthesia Post Note  Patient: Richard [name]  Procedure(s) Performed: ENDARTERECTOMY FEMORAL (Left ) EMBOLECTOMY SFA POPLITEAL (Left )  Patient location during evaluation: PACU Anesthesia Type: General Level of consciousness: oriented and awake and alert Pain management: pain level controlled Vital Signs Assessment: post-procedure vital signs reviewed and stable Respiratory status: spontaneous breathing Cardiovascular status: blood pressure returned to baseline Anesthetic complications: no     Last Vitals:  Vitals:   03/15/19 0754 03/15/19 0927  BP: 133/71   Pulse: 98   Resp:    Temp: (!) 38.4 C 36.8 C  SpO2: 98%     Last Pain:  Vitals:   03/15/19 0927  TempSrc: Axillary  PainSc:                  [name]

## 2019-03-16 DIAGNOSIS — L8990 Pressure ulcer of unspecified site, unspecified stage: Secondary | ICD-10-CM

## 2019-03-16 LAB — CBC
HCT: 26.8 % — ABNORMAL LOW (ref 39.0–52.0)
Hemoglobin: 8.8 g/dL — ABNORMAL LOW (ref 13.0–17.0)
MCH: 29 pg (ref 26.0–34.0)
MCHC: 32.8 g/dL (ref 30.0–36.0)
MCV: 88.4 fL (ref 80.0–100.0)
Platelets: 670 10*3/uL — ABNORMAL HIGH (ref 150–400)
RBC: 3.03 MIL/uL — ABNORMAL LOW (ref 4.22–5.81)
RDW: 15.6 % — ABNORMAL HIGH (ref 11.5–15.5)
WBC: 18.3 10*3/uL — ABNORMAL HIGH (ref 4.0–10.5)
nRBC: 0 % (ref 0.0–0.2)

## 2019-03-16 LAB — BASIC METABOLIC PANEL
Anion gap: 9 (ref 5–15)
BUN: 11 mg/dL (ref 8–23)
CO2: 25 mmol/L (ref 22–32)
Calcium: 8 mg/dL — ABNORMAL LOW (ref 8.9–10.3)
Chloride: 102 mmol/L (ref 98–111)
Creatinine, Ser: 0.69 mg/dL (ref 0.61–1.24)
GFR calc Af Amer: >60 mL/min (ref >60)
GFR calc non Af Amer: >60 mL/min (ref >60)
Glucose, Bld: 151 mg/dL — ABNORMAL HIGH (ref 70–99)
Potassium: 4.1 mmol/L (ref 3.5–5.1)
Sodium: 136 mmol/L (ref 135–145)

## 2019-03-16 LAB — GLUCOSE, CAPILLARY
Glucose-Capillary: 152 mg/dL — ABNORMAL HIGH (ref 70–99)
Glucose-Capillary: 91 mg/dL (ref 70–99)
Glucose-Capillary: 120 mg/dL — ABNORMAL HIGH (ref 70–99)
Glucose-Capillary: 165 mg/dL — ABNORMAL HIGH (ref 70–99)

## 2019-03-16 MED ORDER — CYCLOBENZAPRINE HCL 10 MG PO TABS
10.0000 mg | ORAL_TABLET | Freq: Three times a day (TID) | ORAL | Status: DC
  Administered 2019-03-16 – 2019-03-19 (×10): 10 mg via ORAL
  Filled 2019-03-16 (×10): qty 1

## 2019-03-16 MED ORDER — ACETAMINOPHEN 325 MG PO TABS
650.0000 mg | ORAL_TABLET | Freq: Four times a day (QID) | ORAL | Status: DC
  Administered 2019-03-16 – 2019-03-19 (×10): 650 mg via ORAL
  Filled 2019-03-16 (×11): qty 2

## 2019-03-16 MED ORDER — GABAPENTIN 100 MG PO CAPS
100.0000 mg | ORAL_CAPSULE | Freq: Three times a day (TID) | ORAL | Status: DC
  Administered 2019-03-16 – 2019-03-18 (×7): 100 mg via ORAL
  Filled 2019-03-16 (×7): qty 1

## 2019-03-16 MED ORDER — HYDROMORPHONE HCL 1 MG/ML IJ SOLN
1.0000 mg | INTRAMUSCULAR | Status: DC | PRN
  Administered 2019-03-16 – 2019-03-17 (×4): 1 mg via INTRAVENOUS
  Filled 2019-03-16 (×4): qty 1

## 2019-03-16 MED ORDER — OXYCODONE-ACETAMINOPHEN 5-325 MG PO TABS
1.0000 | ORAL_TABLET | Freq: Four times a day (QID) | ORAL | Status: DC | PRN
  Administered 2019-03-16 – 2019-03-18 (×9): 1 via ORAL
  Filled 2019-03-16 (×10): qty 1

## 2019-03-16 NOTE — Progress Notes (Signed)
Patient ID: Richard [name], male   DOB: 12/17/1955, 63 y.o.   MRN: [id number]   [employer] [facility] PROGRESS NOTE  Richard [name] [id number]:[id number] DOB: 1956/02/16 DOA: 02/28/2019 PCP: [facility] [place]  HPI/Subjective: Patient has better leg pain. Vitals:   03/15/19 2124 03/16/19 0431  BP: (!) 145/88 (!) 151/83  Pulse: (!) 101 94  Resp: 18 18  Temp: 98.6 F (37 C) 99.1 F (37.3 C)  SpO2: 100% 100%    Filed Weights   02/28/19 1443 03/01/19 0416 03/04/19 1248  Weight: 55 kg 57.3 kg 57.3 kg    ROS: Review of Systems  Constitutional: Positive for malaise/fatigue. Negative for chills and fever.  HENT: Negative for congestion and sore throat.   Eyes: Negative for blurred vision.  Respiratory: Negative for cough and shortness of breath.   Cardiovascular: Negative for chest pain.  Gastrointestinal: Negative for abdominal pain, constipation, diarrhea, nausea and vomiting.  Genitourinary: Negative for dysuria.  Musculoskeletal: Negative for joint pain.       Better left leg pain.  Skin: Negative for rash.  Neurological: Negative for dizziness and headaches.  Psychiatric/Behavioral: Negative for depression. The patient is not nervous/anxious.    Exam: Physical Exam  Constitutional: He is oriented to person, place, and time.  HENT:  Nose: No mucosal edema.  Mouth/Throat: No oropharyngeal exudate or posterior oropharyngeal edema.  Eyes: Pupils are equal, round, and reactive to light. Conjunctivae and lids are normal.  Neck: No JVD present. Carotid bruit is not present. No edema present. No thyroid mass and no thyromegaly present.  Cardiovascular: S1 normal and S2 normal. Exam reveals no gallop.  No murmur heard. Respiratory: No respiratory distress. He has no wheezes. He has no rhonchi. He has no rales.  GI: Soft. Bowel sounds are normal. There is no abdominal tenderness.  Musculoskeletal:        General: No edema.     Right ankle: He exhibits no swelling.     Left  ankle: He exhibits no swelling.     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  Lymphadenopathy:    He has no cervical adenopathy.  Neurological: He is alert and oriented to person, place, and time.  Skin: Skin is warm. Nails show no clubbing.  Stage II decubiti noninfected on the buttock sacral area.  Psychiatric: He has a normal mood and affect.      Data Reviewed: Basic Metabolic Panel: Recent Labs  Lab 03/10/19 0543 03/11/19 0513 03/12/19 0437 03/13/19 0512 03/14/19 0642 03/16/19 0545  NA 135 130* 134* 135 137 136  K 4.5 4.1 4.2 4.2 4.1 4.1  CL 99 95* 99 100 104 102  CO2 26 26 26 27 26 25   GLUCOSE 325* 280* 277* 362* 107* 151*  BUN 11 14 13  25* 19 11  CREATININE 0.64 0.83 0.69 0.76 0.81 0.69  CALCIUM 7.9* 7.8* 7.9* 7.8* 7.3* 8.0*  MG 2.1 2.2 2.2 2.3  --   --   PHOS 3.2  --   --   --   --   --    Liver Function Tests: Recent Labs  Lab 03/10/19 0543  AST 14*  ALT 8  ALKPHOS 35*  BILITOT 0.5  PROT 6.4*  ALBUMIN 2.0*   No results for input(s): LIPASE, AMYLASE in the last 168 hours. CBC: Recent Labs  Lab 03/10/19 0543  03/12/19 1632 03/13/19 0512 03/14/19 0642 03/15/19 0640 03/16/19 0545  WBC 20.2*   < > 23.6* 33.2* 20.8* 23.0* 18.3*  NEUTROABS 16.0*  --   --   --   --   --   --  HGB 7.2*   < > 8.3* 8.3* 6.7* 8.3* 8.8*  HCT 22.3*   < > 25.2* 24.8* 20.9* 24.9* 26.8*  MCV 88.1   < > 87.5 87.0 88.2 86.2 88.4  PLT 550*   < > 644* 667* 594* 598* 670*   < > = values in this interval not displayed.   Cardiac Enzymes: No results for input(s): CKTOTAL, CKMB, CKMBINDEX, TROPONINI in the last 168 hours.  CBG: Recent Labs  Lab 03/15/19 1704 03/15/19 1811 03/15/19 2127 03/16/19 0737 03/16/19 1136  GLUCAP 69* 104* 165* 152* 91    Recent Results (from the past 240 hour(s))  CULTURE, BLOOD (ROUTINE X 2) w Reflex to ID Panel     Status: None   Collection Time: 03/09/19 12:55 PM  Result Value Ref Range Status   Specimen Description BLOOD LEFT ARM  Final   Special  Requests   Final    BOTTLES DRAWN AEROBIC AND ANAEROBIC Blood Culture adequate volume   Culture   Final    NO GROWTH 5 DAYS Performed at [facility], [address] [place], [place] [address]    Report Status 03/14/2019 FINAL  Final  CULTURE, BLOOD (ROUTINE X 2) w Reflex to ID Panel     Status: None   Collection Time: 03/09/19  1:00 PM  Result Value Ref Range Status   Specimen Description BLOOD LEFT HAND  Final   Special Requests   Final    BOTTLES DRAWN AEROBIC AND ANAEROBIC Blood Culture adequate volume   Culture   Final    NO GROWTH 5 DAYS Performed at [facility], [address], [place], [place] [address]    Report Status 03/14/2019 FINAL  Final  Urine Culture     Status: None   Collection Time: 03/09/19  1:23 PM  Result Value Ref Range Status   Specimen Description   Final    URINE, CLEAN CATCH Performed at [facility], [address], [place], [place] [address]    Special Requests   Final    NONE Performed at [facility], [address], [place], [place] [address]    Culture   Final    NO GROWTH Performed at [facility], [place] [address], [place], [place] [address]    Report Status 03/10/2019 FINAL  Final     Studies: No results found.  Scheduled Meds: . acetaminophen  650 mg Oral Q6H  . aspirin  81 mg Oral Daily  . cyclobenzaprine  10 mg Oral TID  . enoxaparin (LOVENOX) injection  40 mg Subcutaneous Q24H  . gabapentin  100 mg Oral TID  . insulin aspart  0-15 Units Subcutaneous TID WC  . insulin aspart  0-5 Units Subcutaneous QHS  . insulin aspart  10 Units Subcutaneous TID WC  . insulin glargine  19 Units Subcutaneous Daily  . mouth rinse  15 mL Mouth Rinse BID  . Ensure Max Protein  11 oz Oral BID BM  . rosuvastatin  40 mg Oral q1800  . sodium chloride flush  10 mL Intravenous Q12H  . ticagrelor  90 mg Oral BID   Continuous Infusions: . sodium chloride 75 mL/hr at 03/16/19 1155  .  piperacillin-tazobactam (ZOSYN)  IV 3.375 g (03/16/19 0819)    Assessment/Plan:  1.  Sepsis with Proteus vulgaris bacteremia.  Likely the gangrenous toes as the source.  Demarcation starting to go up to the left foot.  Patient with low-grade temperature.  Repeat blood cultures so  far negative.  Patient on Rocephin.  Patient has poor circulation and would need a revascularization procedure prior to amputation in order for it to heal.  Patient prefers surgery at this time. Changed from Rocephin to Zosyn per Dr. Arlyss Repress. Better leukocytosis, repeated blood culture is negative so far, follow up CBC.   Continue Zosyn until leukocytosis improves per Dr. Percell Boston.  2.  Severe peripheral vascular disease.  Angiogram showing poor blood supply to the lower extremities. S/P L CFA endarterectomy,  S/p Left below-the-knee amputation by Dr. Lucky Cowboy.  Pain control.  3.  Acute hypoxic respiratory failure.  Patient tapered off oxygen. 4.  Acute myocardial infarction.  Conservative management as per cardiology.  Patient on aspirin.  Hold off on Brilinta because the patient may need a amputation.  High-dose Crestor started. 5.  Hyperglycemia due to type 2 diabetes mellitus.   Increased Lantus to 19 units with sliding scale. Added novolog 10 unit AC.  Blood sugars better controlled. 6.  Acute kidney injury normalized with IV fluids. Dehydration.  Improved with IVF. 7.  Cocaine abuse  8.  Chronic atrial fibrillation.  Aspirin and resume Brilinta per Dr.Dew. 9.  History of pancreatic cancer status post Whipple procedure in 2018 10.  Hypernatremia.  This has normalized. 11.  Anemia of chronic disease.  Patient has received 2 units of packed red blood cells. Hb down to 7.2. no active bleeding.  Up to 8.6 after 1 unit PRBC transfusion. Hemoglobin down to 6.7.  PRBC transfusion 1 unit, Hb up to 8.8.  Follow-up hemoglobin.  12.  2 stage II decubitus on buttocks.  Local wound care.  Code Status:     Code  Status Orders  (From admission, onward)         Start     Ordered   03/02/19 1454  Full code  Continuous     03/02/19 1454        Code Status History    Date Active Date Inactive Code Status Order ID Comments User Context   02/28/2019 1449 03/01/2019 0836 Full Code [id number]  Gorden Harms, MD Inpatient   02/28/2019 1449 02/28/2019 1449 Full Code [id number]  Gorden Harms, MD Inpatient   11/11/2018 0758 11/18/2018 1814 DNR [id number]  Loletha Grayer, MD ED   09/20/2018 1635 09/24/2018 1854 Full Code [id number]  Fritzi Mandes, MD ED   08/03/2018 1020 08/04/2018 1813 Full Code [id number]  Salary, Avel Peace, MD ED   01/09/2018 1025 01/11/2018 1932 Full Code [id number]  Harrie Foreman, MD Inpatient   09/05/2017 1559 09/11/2017 1928 Full Code [id number]  Fritzi Mandes, MD Inpatient   08/24/2017 0238 08/29/2017 2131 Full Code [id number]  Lance Coon, MD Inpatient   08/10/2015 0822 08/11/2015 1947 Full Code [id number]  Harrie Foreman, MD Inpatient     Disposition Plan: SNF in 3 days.  Consultants:  Critical care specialist  Cardiology  Palliative care  Vascular surgery  Podiatry  Antibiotics:  Rocephin  Time spent: 28 minutes.  Spoke with patient's significant other Ms. Lewis on the phone. [name]  [employer]

## 2019-03-16 NOTE — Progress Notes (Signed)
Physical Therapy Treatment Patient Details Name: Richard [name] MRN: [id number] DOB: 14-Jun-1956 Today's Date: 03/16/2019    History of Present Illness Richard [name] is a 63 y.o. 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     PT Comments    Pt admitted with above diagnosis. Pt currently with functional limitations due to the deficits listed below (see PT Problem List). MaxA+1 for bed mobility initially to get up to EOB. Pt is very guarded due to LLE pain. Once upright pt is able to remain seated without external support. Pt is able to transfer to standing with minA+1 to get upright. Once standing pt is safe without external support. Pt is able to ambulate with hop to gait from bed to recliner. He requires assist to turn walker when getting to the recliner. Pt is mildly unsteady during ambulation but only requires minA+1 for support. Unable to ambulate farther at this time. Pt will benefit from PT services to address deficits in strength, balance, and mobility in order to return to full function at home.    Follow Up Recommendations  SNF     Equipment Recommendations  None recommended by PT    Recommendations for Other Services       Precautions / Restrictions Precautions Precautions: Fall Restrictions Weight Bearing Restrictions: Yes RLE Weight Bearing: Non weight bearing LLE Weight Bearing: Non weight bearing Other Position/Activity Restrictions: Pt s/p L BKA    Mobility  Bed Mobility Overal bed mobility: Needs Assistance Bed Mobility: Supine to Sit     Supine to sit: Max assist     General bed mobility comments: MaxA+1 initially to get up to EOB. Once upright pt is able to remain seated without external support  Transfers Overall transfer level: Needs assistance Equipment used: Rolling walker (2 wheeled) Transfers: Sit to/from Stand Sit to  Stand: Min assist         General transfer comment: Pt is able to transfer to standing with minA+1 to get upright. Once standing pt is safe without external support  Ambulation/Gait Ambulation/Gait assistance: Min assist Gait Distance (Feet): 3 Feet Assistive device: Rolling walker (2 wheeled)       General Gait Details: Pt is able to ambulate with hop to gait from bed to recliner. He requires assist to turn walker when getting to the recliner. Pt is mildly unsteady during ambulation but only requires minA+1 for support   Stairs             Wheelchair Mobility    Modified Rankin (Stroke Patients Only)       Balance Overall balance assessment: Needs assistance Sitting-balance support: No upper extremity supported Sitting balance-Leahy Scale: Good     Standing balance support: Bilateral upper extremity supported Standing balance-Leahy Scale: Fair Standing balance comment: Requires UE support on walker in standing                            Cognition Arousal/Alertness: Awake/alert Behavior During Therapy: WFL for tasks assessed/performed Overall Cognitive Status: Within Functional Limits for tasks assessed                                 General Comments: Pt declined OOB activity 2/2 pain, but was able to follow conversation and participate  in evaluation at bed-level.       Exercises Other Exercises Other Exercises: Pt educated in supine and seated exercises. Tolerates limited exercises due to LLE pain    General Comments General comments (skin integrity, edema, etc.): Pt engaged well in conversation but remained supine in bed with covers pulled to face t/o evaluation despite education provided on importance of early mobilization after sx. Pt agreeable to attempt OOB activity at next session.       Pertinent Vitals/Pain Pain Assessment: 0-10 Pain Score: 7  Pain Location: L leg at amputation site Pain Descriptors / Indicators:  Guarding;Grimacing;Constant Pain Intervention(s): Monitored during session;Repositioned    Home Living Family/patient expects to be discharged to:: Private residence Living Arrangements: Spouse/significant other;Children Available Help at Discharge: Family;Available PRN/intermittently Type of Home: Apartment Home Access: Stairs to enter Entrance Stairs-Rails: Left Home Layout: One level Home Equipment: Cane - single point;Walker - 2 wheels;Crutches      Prior Function Level of Independence: Independent with assistive device(s)      Comments: Mod indep with SPC for ADLs, household mobilization; very limited out of house mobility.  Celesta Gentile and daughter assist with community needs, errands.   PT Goals (current goals can now be found in the care plan section) Acute Rehab PT Goals Patient Stated Goal: Return to ambulation PT Goal Formulation: With patient Time For Goal Achievement: 03/30/19 Potential to Achieve Goals: Fair    Frequency    7X/week      PT Plan      Co-evaluation              AM-PAC PT "6 Clicks" Mobility   Outcome Measure  Help needed turning from your back to your side while in a flat bed without using bedrails?: A Lot Help needed moving from lying on your back to sitting on the side of a flat bed without using bedrails?: A Lot Help needed moving to and from a bed to a chair (including a wheelchair)?: A Lot Help needed standing up from a chair using your arms (e.g., wheelchair or bedside chair)?: A Little Help needed to walk in hospital room?: A Lot Help needed climbing 3-5 steps with a railing? : Total 6 Click Score: 12    End of Session Equipment Utilized During Treatment: Gait belt Activity Tolerance: Patient limited by pain Patient left: in chair;with call bell/phone within reach;with chair alarm set;Other (comment)(towel under distal LLE to minimize risk knee flex contractur) Nurse Communication: Mobility status PT Visit Diagnosis:  Unsteadiness on feet (R26.81);Other abnormalities of gait and mobility (R26.89);Muscle weakness (generalized) (M62.81);Difficulty in walking, not elsewhere classified (R26.2)     Time: 4196-2229 PT Time Calculation (min) (ACUTE ONLY): 25 min  Charges:  $Gait Training: 8-22 mins                     Lyndel Safe Kaelan Amble PT, DPT, GCS    Rayleigh Gillyard 03/16/2019, 12:08 PM

## 2019-03-16 NOTE — Progress Notes (Signed)
[facility] MD Progress Note  03/16/2019 2:29 PM Richard [name]  MRN:  [id number] Subjective: "I am doing better.  I plan to go to rehab before going back home." Principal Problem: Respiratory failure ([place]) Diagnosis: Active Problems:   Respiratory failure ([place])   Pressure injury of skin   Cocaine use disorder [facility])  Patient seen. Chart reviewed. Total Time spent with patient: 15 minutes  HPI: Richard [name] is a 63 y.o. 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 Psychiatry consult requested to address substance use and underlying mood disorder.  On evaluation, patient is calm and cooperative.  He is preparing to work with physical therapy.  He has a dressing to his left leg at amputation site which is intact.  Patient states that normally he lives with his fiance, but is aware that he will be discharged to rehab for further treatment until he can return safely home with his family members.  Patient denies any symptoms of depression, anxiety or panic attacks.  Patient denies any past self-harm.  Patient is currently denying any suicidal ideation, plan, or intent.  He denies HI.  He denies AVH.  Patient denies any symptoms of cravings for substances.  He reports that he has "regularly used cocaine in order to treat his pain".  Patient reports his pain is being managed adequately while in the hospital.  He does not desire resources for substance use treatment.  Patient vocalizes "no questions, no concerns, no problems" for psychiatry.  Past Psychiatric History: he denies past psych diagnoses, past psych admissions, past suicidal attempts, taking any psych medications.  Social Hx: patient reports he lives with significant other. Has children, Not working, Denies legal history. Denies access to firearms.  Past Medical History:  Past Medical History:  Diagnosis Date  .  Atrial fibrillation ([place])    per patient  . Cancer ([place])   . CHF (congestive heart failure) ([place])   . Chronic back pain   . Chronic leg pain   . Coronary artery disease   . Diabetes mellitus without complication ([place])   . Hypertension   . Neuropathy     Past Surgical History:  Procedure Laterality Date  . AMPUTATION Left 03/13/2019   Procedure: AMPUTATION BELOW KNEE;  Surgeon: Algernon Huxley, MD;  Location: [facility] ORS;  Service: Vascular;  Laterality: Left;  . CORONARY ANGIOPLASTY WITH STENT PLACEMENT    . EMBOLECTOMY Left 03/12/2019   Procedure: EMBOLECTOMY SFA POPLITEAL;  Surgeon: Algernon Huxley, MD;  Location: [facility] ORS;  Service: Vascular;  Laterality: Left;  . ENDARTERECTOMY FEMORAL Left 03/12/2019   Procedure: ENDARTERECTOMY FEMORAL;  Surgeon: Algernon Huxley, MD;  Location: [facility] ORS;  Service: Vascular;  Laterality: Left;  . ESOPHAGOGASTRODUODENOSCOPY N/A 09/11/2017   Procedure: ESOPHAGOGASTRODUODENOSCOPY (EGD);  Surgeon: Virgel Manifold, MD;  [facility] END[facility]COPY;  Service: Endoscopy;  Laterality: N/A;  . LOWER EXTREMITY ANGIOGRAPHY Left 11/17/2018   Procedure: Lower Extremity Angiography, with possible intervention;  Surgeon: Algernon Huxley, MD;  Location: [place] CV LAB;  Service: Cardiovascular;  Laterality: Left;  . LOWER EXTREMITY ANGIOGRAPHY Left 03/04/2019   Procedure: Lower Extremity Angiography;  Surgeon: Algernon Huxley, MD;  Location: [place] CV LAB;  Service: Cardiovascular;  Laterality: Left;  . pancreas removed     Family History:  Family History  Problem Relation Age of Onset  . CAD Mother   .  Diabetes Mellitus II Mother   . Diabetes Mellitus II Father    Family Psychiatric  History:  denies any psych history and suicides in family members.  Social History:  Social History   Substance and Sexual Activity  Alcohol Use No  . Alcohol/week: 0.0 standard drinks     Social History   Substance and Sexual Activity  Drug Use Yes  . Types: 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 Cocaine used over a year ago per pt.    Social History   Socioeconomic History  . Marital status: Single    Spouse name: Not on file  . Number of children: Not on file  . Years of education: Not on file  . Highest education level: Not on file  Occupational History  . Not on file  Social Needs  . Financial resource strain: Not on file  . Food insecurity:    Worry: Not on file    Inability: Not on file  . Transportation needs:    Medical: Not on file    Non-medical: Not on file  Tobacco Use  . Smoking status: Current Every Day Smoker    Packs/day: 0.50  . Smokeless tobacco: Never Used  Substance and Sexual Activity  . Alcohol use: No    Alcohol/week: 0.0 standard drinks  . 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 Cocaine used over a year ago per pt.  . Sexual activity: Not on file  Lifestyle  . Physical activity:    Days per week: Not on file    Minutes per session: Not on file  . Stress: Not on file  Relationships  . Social connections:    Talks on phone: Not on file    Gets together: Not on file    Attends religious service: Not on file    Active member of club or organization: Not on file    Attends meetings of clubs or organizations: Not on file    Relationship status: Not on file  Other Topics Concern  . Not on file  Social History Narrative  . Not on file   Additional Social History:       Lives with fiance'                  Sleep: Fair  Appetite:  Good  Current Medications: Current Facility-Administered Medications  Medication Dose Route Frequency Provider Last Rate Last Dose  . 0.9 %  sodium chloride infusion   Intravenous Continuous Algernon Huxley, MD 75 mL/hr at 03/16/19 1155    . acetaminophen (TYLENOL) tablet 650 mg  650 mg Oral Q6H Griffin, Crystal, NP   650 mg at 03/16/19 1148  . aspirin chewable tablet 81 mg  81 mg Oral Daily Algernon Huxley, MD   81 mg at 03/16/19 0810   . cyclobenzaprine (FLEXERIL) tablet 10 mg  10 mg Oral TID Asencion Gowda, NP   10 mg at 03/16/19 1148  . enoxaparin (LOVENOX) injection 40 mg  40 mg Subcutaneous Q24H Algernon Huxley, MD   40 mg at 03/16/19 0810  . gabapentin (NEURONTIN) capsule 100 mg  100 mg Oral TID Asencion Gowda, NP   100 mg at 03/16/19 1148  . HYDROmorphone (DILAUDID) injection 1 mg  1 mg Intravenous Q4H PRN Asencion Gowda, NP   1 mg at 03/16/19 1148  . insulin aspart (novoLOG) injection 0-15 Units  0-15 Units Subcutaneous TID WC Dew,  Erskine Squibb, MD   3 Units at 03/16/19 0809  . insulin aspart (novoLOG) injection 0-5 Units  0-5 Units Subcutaneous QHS Algernon Huxley, MD      . insulin aspart (novoLOG) injection 10 Units  10 Units Subcutaneous TID WC Algernon Huxley, MD   10 Units at 03/16/19 0809  . insulin glargine (LANTUS) injection 19 Units  19 Units Subcutaneous Daily Algernon Huxley, MD   19 Units at 03/16/19 [phone]  . MEDLINE mouth rinse  15 mL Mouth Rinse BID Algernon Huxley, MD   15 mL at 03/16/19 0811  . naloxone [facility]) injection 0.4 mg  0.4 mg Intravenous PRN Algernon Huxley, MD      . ondansetron [facility]) injection 4 mg  4 mg Intravenous Q6H PRN Algernon Huxley, MD   4 mg at 03/12/19 7564  . oxyCODONE-acetaminophen (PERCOCET/ROXICET) 5-325 MG per tablet 1 tablet  1 tablet Oral Q6H PRN Asencion Gowda, NP   1 tablet at 03/16/19 1231  . piperacillin-tazobactam (ZOSYN) IVPB 3.375 g  3.375 g Intravenous Q8H Algernon Huxley, MD 12.5 mL/hr at 03/16/19 0819 3.375 g at 03/16/19 0819  . protein supplement (ENSURE MAX) liquid  11 oz Oral BID BM Algernon Huxley, MD   11 oz at 03/16/19 0809  . rosuvastatin (CRESTOR) tablet 40 mg  40 mg Oral q1800 Algernon Huxley, MD   40 mg at 03/15/19 1759  . sodium chloride flush (NS) 0.9 % injection 10 mL  10 mL Intravenous Q12H Algernon Huxley, MD   10 mL at 03/16/19 0810  . sodium chloride flush (NS) 0.9 % injection 10 mL  10 mL Intravenous PRN Algernon Huxley, MD   10 mL at 03/06/19 1816  . ticagrelor (BRILINTA)  tablet 90 mg  90 mg Oral BID [name], MD   90 mg at 03/16/19 3329    Lab Results:  Results for orders placed or performed during the hospital encounter of 02/28/19 (from the past 48 hour(s))  Glucose, capillary     Status: Abnormal   Collection Time: 03/14/19  9:30 PM  Result Value Ref Range   Glucose-Capillary 108 (H) 70 - 99 mg/dL  CBC     Status: Abnormal   Collection Time: 03/15/19  6:40 AM  Result Value Ref Range   WBC 23.0 (H) 4.0 - 10.5 K/uL   RBC 2.89 (L) 4.22 - 5.81 MIL/uL   Hemoglobin 8.3 (L) 13.0 - 17.0 g/dL   HCT 24.9 (L) 39.0 - 52.0 %   MCV 86.2 80.0 - 100.0 fL   MCH 28.7 26.0 - 34.0 pg   MCHC 33.3 30.0 - 36.0 g/dL   RDW 15.6 (H) 11.5 - 15.5 %   Platelets 598 (H) 150 - 400 K/uL   nRBC 0.2 0.0 - 0.2 %    Comment: Performed at [facility], [address] [place], [place] [address]  Glucose, capillary     Status: Abnormal   Collection Time: 03/15/19  7:56 AM  Result Value Ref Range   Glucose-Capillary 184 (H) 70 - 99 mg/dL  Glucose, capillary     Status: Abnormal   Collection Time: 03/15/19 12:10 PM  Result Value Ref Range   Glucose-Capillary 162 (H) 70 - 99 mg/dL  Glucose, capillary     Status: Abnormal   Collection Time: 03/15/19  4:34 PM  Result Value Ref Range   Glucose-Capillary 63 (L) 70 - 99 mg/dL  Glucose, capillary     Status: Abnormal  Collection Time: 03/15/19  5:04 PM  Result Value Ref Range   Glucose-Capillary 69 (L) 70 - 99 mg/dL  Glucose, capillary     Status: Abnormal   Collection Time: 03/15/19  6:11 PM  Result Value Ref Range   Glucose-Capillary 104 (H) 70 - 99 mg/dL  Glucose, capillary     Status: Abnormal   Collection Time: 03/15/19  9:27 PM  Result Value Ref Range   Glucose-Capillary 165 (H) 70 - 99 mg/dL  CBC     Status: Abnormal   Collection Time: 03/16/19  5:45 AM  Result Value Ref Range   WBC 18.3 (H) 4.0 - 10.5 K/uL   RBC 3.03 (L) 4.22 - 5.81 MIL/uL   Hemoglobin 8.8 (L) 13.0 - 17.0 g/dL   HCT 26.8 (L) 39.0 - 52.0  %   MCV 88.4 80.0 - 100.0 fL   MCH 29.0 26.0 - 34.0 pg   MCHC 32.8 30.0 - 36.0 g/dL   RDW 15.6 (H) 11.5 - 15.5 %   Platelets 670 (H) 150 - 400 K/uL   nRBC 0.0 0.0 - 0.2 %    Comment: Performed at [facility], [address] [place], [place] [address]  Basic metabolic panel     Status: Abnormal   Collection Time: 03/16/19  5:45 AM  Result Value Ref Range   Sodium 136 135 - 145 mmol/L   Potassium 4.1 3.5 - 5.1 mmol/L   Chloride 102 98 - 111 mmol/L   CO2 25 22 - 32 mmol/L   Glucose, Bld 151 (H) 70 - 99 mg/dL   BUN 11 8 - 23 mg/dL   Creatinine, Ser 0.69 0.61 - 1.24 mg/dL   Calcium 8.0 (L) 8.9 - 10.3 mg/dL   GFR calc non Af Amer >60 >60 mL/min   GFR calc Af Amer >60 >60 mL/min   Anion gap 9 5 - 15    Comment: Performed at [facility], [address] [place], [place] [address]  Glucose, capillary     Status: Abnormal   Collection Time: 03/16/19  7:37 AM  Result Value Ref Range   Glucose-Capillary 152 (H) 70 - 99 mg/dL  Glucose, capillary     Status: None   Collection Time: 03/16/19 11:36 AM  Result Value Ref Range   Glucose-Capillary 91 70 - 99 mg/dL    Blood Alcohol level:  Lab Results  Component Value Date   ETH <10 02/28/2019   ETH <10 54/62/7035    Metabolic Disorder Labs: Lab Results  Component Value Date   HGBA1C >15.5 (H) 02/28/2019   MPG >398 02/28/2019   MPG 378.06 11/11/2018   No results found for: PROLACTIN Lab Results  Component Value Date   CHOL 186 08/10/2015   TRIG 144 08/10/2015   HDL 25 (L) 08/10/2015   CHOLHDL 7.4 08/10/2015   VLDL 29 08/10/2015   LDLCALC 132 (H) 08/10/2015    Physical Findings: AIMS:  , ,  ,  ,    CIWA:    COWS:     Musculoskeletal: Strength & Muscle Tone: within normal limits Gait & Station: left BKA with dressing in place. Patient leans: N/A  Psychiatric Specialty Exam: Physical Exam  Constitutional: He is oriented to person, place, and time. He appears well-developed and well-nourished. No  distress.  HENT:  Head: Normocephalic and atraumatic.  Eyes: EOM are normal.  Neck: Normal range of motion.  Cardiovascular: Normal rate.  Respiratory: Effort normal. No respiratory distress.  Musculoskeletal: Normal range of motion.  Comments: Left BKA with dressing and drainage device intact.  Neurological: He is alert and oriented to person, place, and time.    Review of Systems  Constitutional: Negative.   Respiratory: Negative.   Cardiovascular: Negative.   Musculoskeletal:       Post-operative pain  Neurological: Negative.   Psychiatric/Behavioral: Positive for substance abuse (cocaine). Negative for depression, hallucinations, memory loss and suicidal ideas. The patient is not nervous/anxious and does not have insomnia.     Blood pressure (!) 151/83, pulse 94, temperature 99.1 F (37.3 C), resp. rate 18, height 5\' 5"  (1.651 m), weight 57.3 kg, SpO2 100 %.Body mass index is 21.02 kg/m.  General Appearance: Neat  Eye Contact:  Fair  Speech:  Clear and Coherent and Normal Rate  Volume:  Decreased  Mood:  Irritable  Affect:  Appropriate  Thought Process:  Goal Directed and Linear  Orientation:  Full (Time, Place, and Person)  Thought Content:  Logical and Hallucinations: None  Suicidal Thoughts:  No  Homicidal Thoughts:  No  Memory:  good  Judgement:  Fair  Insight:  Shallow  Psychomotor Activity:  Normal  Concentration:  Concentration: Fair  Recall:  Good  Fund of Knowledge:  Fair  Language:  Good  Akathisia:  No  Handed:  Right  AIMS (if indicated):     Assets:  Communication Skills Housing Intimacy Social Support  ADL's:  Impaired  Cognition:  WNL  Sleep:   adequate     Treatment Plan Summary: Daily contact with patient to assess and evaluate symptoms and progress in treatment   Disposition: No evidence of imminent risk to self or others at present.   Patient does not meet criteria for psychiatric inpatient admission. Supportive therapy provided  about ongoing stressors.   Assessment: Mr. Romer is a 63 y.o. 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 Psychiatry consult requested to address substance use and underlying mood disorder.  Impression: Cocaine use disorder, moderate.  Patient denies feeling depressed, anxious, suicidal, homicidal, he does not appear psychotic, gravely-disabled or at risk to harm self/others, so he does not meet criteria for inpatient psych admission. Regarding substance use, patient is in precontemplative stage and does not see his cocaine use as a problem, possibly minimizes his substance use. He refuses any chem-dependency referrals, resources and is not interested in treatment at this time. Patient notified that resources can be provided if he changes his mind. Patient expressed his agreement and understanding to the same.  Lavella Hammock, MD 03/16/2019, 2:29 PM

## 2019-03-16 NOTE — Progress Notes (Addendum)
Daily Progress Note   Patient Name: Richard [name]       Date: 03/16/2019 DOB: 1956-09-25  Age: 63 y.o. MRN#: [id number] Attending Physician: [name], MD Primary Care Physician: [facility] [place] Date: 02/28/2019  Reason for Consultation/Follow-up: Establishing goals of care  Subjective: Patient is sitting in bedside chair. He complains of throbbing and burning pain in his stump. He is in good spirits overall.   Dilaudid 1mg  q4 PRN for breakthrough pain. Percocet 5mg  1 q 6h PRN for pain.  Neurontin 100mg  TID for neuropathic pain. Could increase to 200mg  TID tomorrow if well tolerated today. Flexeril 10mg  TID for muscle spasms. Tylenol 650mg  q6h scheduled for pain.  Please provide PRN medications as needed to help aleve peaks and troughs. Will need to wean as able. Hopeful multimodal therapy will help with pain relief.    Length of Stay: 16  Current Medications: Scheduled Meds:  . acetaminophen  650 mg Oral Q6H  . aspirin  81 mg Oral Daily  . cyclobenzaprine  10 mg Oral TID  . enoxaparin (LOVENOX) injection  40 mg Subcutaneous Q24H  . gabapentin  100 mg Oral TID  . insulin aspart  0-15 Units Subcutaneous TID WC  . insulin aspart  0-5 Units Subcutaneous QHS  . insulin aspart  10 Units Subcutaneous TID WC  . insulin glargine  19 Units Subcutaneous Daily  . mouth rinse  15 mL Mouth Rinse BID  . Ensure Max Protein  11 oz Oral BID BM  . rosuvastatin  40 mg Oral q1800  . sodium chloride flush  10 mL Intravenous Q12H  . ticagrelor  90 mg Oral BID    Continuous Infusions: . sodium chloride 75 mL/hr at 03/15/19 1438  . piperacillin-tazobactam (ZOSYN)  IV 3.375 g (03/16/19 0819)    PRN Meds: HYDROmorphone (DILAUDID) injection, naLOXone (NARCAN)  injection,  ondansetron (ZOFRAN) IV, oxyCODONE-acetaminophen, sodium chloride flush  Physical Exam Pulmonary:     Effort: Pulmonary effort is normal.  Skin:    General: Skin is warm and dry.  Neurological:     Mental Status: He is alert.     Comments: Oriented             Vital Signs: BP (!) 151/83 (BP Location: Right Arm)   Pulse 94   Temp 99.1 F (  37.3 C)   Resp 18   Ht 5\' 5"  (1.651 m)   Wt 57.3 kg   SpO2 100%   BMI 21.02 kg/m  SpO2: SpO2: 100 % O2 Device: O2 Device: Room Air O2 Flow Rate: O2 Flow Rate (L/min): 6 L/min  Intake/output summary:   Intake/Output Summary (Last 24 hours) at 03/16/2019 1122 Last data filed at 03/16/2019 3300 Gross per 24 hour  Intake 1516.33 ml  Output 1775 ml  Net -258.67 ml   LBM: Last BM Date: 03/13/19 Baseline Weight: Weight: 54 kg Most recent weight: Weight: 57.3 kg       Palliative Assessment/Data:    Flowsheet Rows     Most Recent Value  Intake Tab  Referral Department  Critical care  Unit at Time of Referral  ICU  Palliative Care Primary Diagnosis  Cancer  Date Notified  02/28/19  Palliative Care Type  New Palliative care  Reason for referral  Clarify Goals of Care  Date of Admission  02/28/19  Date first seen by Palliative Care  03/02/19  # of days Palliative referral response time  2 Day(s)  # of days IP prior to Palliative referral  0  Clinical Assessment  Psychosocial & Spiritual Assessment  Palliative Care Outcomes      Patient Active Problem List   Diagnosis Date Noted  . Cocaine use disorder ([place])   . Pressure injury of skin 03/12/2019  . Respiratory failure ([place]) 02/28/2019  . Diabetes with hyperosmolar coma ([place]) 11/11/2018  . A-fib ([place]) 09/20/2018  . Hyperglycemia 08/03/2018  . Protein-calorie malnutrition, severe 01/10/2018  . Sepsis ([place]) 01/09/2018  . DKA, type 2 ([place]) 09/05/2017  . Ileus ([place]) 08/24/2017  . Diabetes ([place]) 08/24/2017  . Atrial flutter ([place]) 08/24/2017  . CAD (coronary artery disease)  08/24/2017  . HTN (hypertension) 08/24/2017  . Atrial fibrillation ([place]) 08/24/2017  . Tobacco use disorder, severe, dependence 07/25/2017  . Wound dehiscence, surgical, initial encounter 06/24/2017  . Acute hematogenous osteomyelitis of right foot ([place]) 06/20/2017  . Adenocarcinoma of pancreas ([place]) 06/20/2017  . Esophagitis 06/20/2017  . Cellulitis of right lower extremity 06/10/2017  . Hypertension, poor control 06/10/2017  . Insulin dependent type 2 diabetes mellitus ([place]) 06/10/2017  . Chest pain 08/10/2015    Palliative Care Assessment & Plan    Recommendations/Plan:  Pain medications adjusted for pain relief. Please see above.  Appreciate psych consult.     Code Status:    Code Status Orders  (From admission, onward)         Start     Ordered   03/02/19 1454  Full code  Continuous     03/02/19 1454        Code Status History    Date Active Date Inactive Code Status Order ID Comments User Context   02/28/2019 1449 03/01/2019 0836 Full Code [id number]  Gorden Harms, MD Inpatient   02/28/2019 1449 02/28/2019 1449 Full Code [id number]  Gorden Harms, MD Inpatient   11/11/2018 0758 11/18/2018 1814 DNR [id number]  Loletha Grayer, MD ED   09/20/2018 1635 09/24/2018 1854 Full Code [id number]  Fritzi Mandes, MD ED   08/03/2018 1020 08/04/2018 1813 Full Code [id number]  Salary, Avel Peace, MD ED   01/09/2018 1025 01/11/2018 1932 Full Code [id number]  Harrie Foreman, MD Inpatient   09/05/2017 1559 09/11/2017 1928 Full Code [id number]  Fritzi Mandes, MD Inpatient   08/24/2017 0238 08/29/2017 2131 Full Code [id number]  Lance Coon, MD Inpatient   08/10/2015 [phone] 08/11/2015  1947 Full Code [id number]  Harrie Foreman, MD Inpatient       Prognosis:   Unable to determine  Discharge Planning:  To Be Determined   Thank you for allowing the Palliative Medicine Team to assist in the care of this patient.   Total Time 35 min Prolonged Time Billed  no       Greater than  50%  of this time was spent counseling and coordinating care related to the above assessment and plan.  Asencion Gowda, NP  Please contact Palliative Medicine Team phone at [phone] for questions and concerns.

## 2019-03-16 NOTE — Progress Notes (Signed)
Subjective  - POD #3  Still having pain at amputation site   Physical Exam:  Dressings dry without drainage through ace wrap       Assessment/Plan:  POD #3  Patient sitting on bed pan and does not want dressing changed.  Will plan to remove tomorrow  Richard [name] 03/16/2019 5:35 PM --  Vitals:   03/15/19 2124 03/16/19 0431  BP: (!) 145/88 (!) 151/83  Pulse: (!) 101 94  Resp: 18 18  Temp: 98.6 F (37 C) 99.1 F (37.3 C)  SpO2: 100% 100%    Intake/Output Summary (Last 24 hours) at 03/16/2019 1735 Last data filed at 03/16/2019 1206 Gross per 24 hour  Intake 1888.43 ml  Output 1575 ml  Net 313.43 ml     Laboratory CBC    Component Value Date/Time   WBC 18.3 (H) 03/16/2019 0545   HGB 8.8 (L) 03/16/2019 0545   HCT 26.8 (L) 03/16/2019 0545   PLT 670 (H) 03/16/2019 0545    BMET    Component Value Date/Time   NA 136 03/16/2019 0545   K 4.1 03/16/2019 0545   CL 102 03/16/2019 0545   CO2 25 03/16/2019 0545   GLUCOSE 151 (H) 03/16/2019 0545   BUN 11 03/16/2019 0545   CREATININE 0.69 03/16/2019 0545   CALCIUM 8.0 (L) 03/16/2019 0545   GFRNONAA >60 03/16/2019 0545   GFRAA >60 03/16/2019 0545    COAG Lab Results  Component Value Date   INR 1.2 03/12/2019   INR 1.2 03/09/2019   INR 1.5 (H) 02/28/2019   No results found for: PTT  Antibiotics Anti-infectives (From admission, onward)   Start     Dose/Rate Route Frequency Ordered Stop   03/10/19 1645  piperacillin-tazobactam (ZOSYN) IVPB 3.375 g  Status:  Discontinued     3.375 g 100 mL/hr over 30 Minutes Intravenous Every 8 hours 03/10/19 1633 03/10/19 1639   03/10/19 1645  piperacillin-tazobactam (ZOSYN) IVPB 3.375 g     3.375 g 12.5 mL/hr over 240 Minutes Intravenous Every 8 hours 03/10/19 1639     03/09/19 0600  ceFAZolin (ANCEF) IVPB 2g/100 mL premix  Status:  Discontinued     2 g 200 mL/hr over 30 Minutes Intravenous On call to O.R. 03/08/19 2326 03/08/19 2331   03/08/19 2326  ceFAZolin  (ANCEF) IVPB 2g/100 mL premix  Status:  Discontinued     2 g 200 mL/hr over 30 Minutes Intravenous 30 min pre-op 03/08/19 2326 03/09/19 0940   03/08/19 2100  cefTRIAXone (ROCEPHIN) 2 g in sodium chloride 0.9 % 100 mL IVPB  Status:  Discontinued     2 g 200 mL/hr over 30 Minutes Intravenous Every 24 hours 03/08/19 2048 03/10/19 1633   03/04/19 1214  ceFAZolin (ANCEF) 2-4 GM/100ML-% IVPB    Note to Pharmacy:  Despina Arias  : cabinet override      03/04/19 1214 03/04/19 1900   03/04/19 1210  ceFAZolin (ANCEF) IVPB 2g/100 mL premix  Status:  Discontinued     2 g 200 mL/hr over 30 Minutes Intravenous 30 min pre-op 03/04/19 1211 03/04/19 1653   03/03/19 1800  cefTRIAXone (ROCEPHIN) 1 g in sodium chloride 0.9 % 100 mL IVPB  Status:  Discontinued     1 g 200 mL/hr over 30 Minutes Intravenous Every 24 hours 03/03/19 1217 03/03/19 1230   03/03/19 1800  cefTRIAXone (ROCEPHIN) 2 g in sodium chloride 0.9 % 100 mL IVPB     2 g 200 mL/hr over 30 Minutes  Intravenous Every 24 hours 03/03/19 1230 03/07/19 1739   03/02/19 1100  meropenem (MERREM) 1 g in sodium chloride 0.9 % 100 mL IVPB  Status:  Discontinued     1 g 200 mL/hr over 30 Minutes Intravenous Every 8 hours 03/02/19 1035 03/03/19 1217   03/01/19 1000  ceFEPIme (MAXIPIME) 2 g in sodium chloride 0.9 % 100 mL IVPB  Status:  Discontinued    Note to Pharmacy:  Pharmacy to dose   2 g 200 mL/hr over 30 Minutes Intravenous Every 12 hours 03/01/19 0742 03/01/19 0836   03/01/19 0800  vancomycin (VANCOCIN) IVPB 1000 mg/200 mL premix  Status:  Discontinued     1,000 mg 200 mL/hr over 60 Minutes Intravenous Every 36 hours 03/01/19 0735 03/01/19 0836   02/28/19 2200  ceFEPIme (MAXIPIME) 1 g in sodium chloride 0.9 % 100 mL IVPB  Status:  Discontinued    Note to Pharmacy:  Pharmacy to dose   1 g 200 mL/hr over 30 Minutes Intravenous Every 12 hours 02/28/19 1321 03/01/19 0742   02/28/19 1352  vancomycin variable dose per unstable renal function  (pharmacist dosing)  Status:  Discontinued      Does not apply See admin instructions 02/28/19 1354 03/01/19 0735   02/28/19 1330  vancomycin (VANCOCIN) IVPB 1000 mg/200 mL premix  Status:  Discontinued    Note to Pharmacy:  Pharmacy to dose   1,000 mg 200 mL/hr over 60 Minutes Intravenous  Once 02/28/19 1321 02/28/19 1354   02/28/19 0900  ceFEPIme (MAXIPIME) 2 g in sodium chloride 0.9 % 100 mL IVPB     2 g 200 mL/hr over 30 Minutes Intravenous  Once 02/28/19 0856 02/28/19 1100   02/28/19 0900  vancomycin (VANCOCIN) IVPB 1000 mg/200 mL premix     1,000 mg 200 mL/hr over 60 Minutes Intravenous  Once 02/28/19 0856 02/28/19 1143       V. Leia Alf, M.D., [facility] [place] Office: [phone] Pager:  [phone]

## 2019-03-16 NOTE — Evaluation (Signed)
Occupational Therapy Evaluation Patient Details Name: Richard [name] MRN: [id number] DOB: 10/26/1955 Today's Date: 03/16/2019    History of Present Illness Richard [name] is a 63 y.o. 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    Clinical Impression   Pt seen for OT evaluation this date, POD#3 from above surgery. Pt was independent with assisted devices in ADLs prior to surgery. Pt lives with his fiance and 35 y.o. daughter who assist him with errands and IADLs. Pt is eager to return to PLOF with less pain and improved safety and independence. Pt declined any OOB activity at time of evaluation and c/o 8/10 pain. RN informed this therapist prior to session that pt had recently received pain medication. Pt instructed in importance of early mobilization after therapy, desensitization strategies for management of residual limb, falls prevention strategies, home/routines modifications, DME/AE for LB bathing and dressing tasks, and compression stocking mgt. Pt would benefit from skilled OT services including additional instruction in dressing techniques with or without assistive devices for dressing and bathing skills to support recall and carryover prior to discharge and ultimately to maximize safety, independence, and minimize falls risk and caregiver burden. Upon hospital DC, recommending STR to promote improved safety and functional return.     Follow Up Recommendations  SNF    Equipment Recommendations  (TBD at next venue of care. )    Recommendations for Other Services       Precautions / Restrictions Precautions Precautions: Fall Restrictions Weight Bearing Restrictions: Yes RLE Weight Bearing: Non weight bearing Other Position/Activity Restrictions: Pt s/p L BKA      Mobility Bed Mobility Overal bed mobility: Needs Assistance             General bed  mobility comments: Deferred. Pt declined. Remained supine in bed t/o evaluation.  Transfers Overall transfer level: Needs assistance                    Balance Overall balance assessment: Needs assistance                                         ADL either performed or assessed with clinical judgement   ADL Overall ADL's : Needs assistance/impaired                                       General ADL Comments: Pt declined to attempt any ADLs during evaluation. Suspect pt at min/mod assist for LB ADLs 2/2 pain and limited ROM of LLE at this time. Pt set-up assist for UB ADLs d/t deficits in functional mobility at this time.  Pt educated in options for AE to improve independence during ADL tasks. Pt also endorses multiple falls during ADLs including one while dressing and one while showering. Falls prevention education provided. Pt would benefit from reinforcement.     Vision Baseline Vision/History: Wears glasses Wears Glasses: Reading only Patient Visual Report: No change from baseline       Perception     Praxis      Pertinent Vitals/Pain Pain Assessment: 0-10 Pain Score: 8  Pain Location: LLE Pain Descriptors / Indicators: Guarding;Grimacing;Constant Pain Intervention(s): Limited activity within patient's tolerance;Monitored  during session;Premedicated before session     Hand Dominance     Extremity/Trunk Assessment Upper Extremity Assessment Upper Extremity Assessment: Overall WFL for tasks assessed   Lower Extremity Assessment Lower Extremity Assessment: Defer to PT evaluation;LLE deficits/detail LLE Deficits / Details: s/p L BKA LLE: Unable to fully assess due to pain       Communication Communication Communication: No difficulties   Cognition Arousal/Alertness: Awake/alert Behavior During Therapy: WFL for tasks assessed/performed Overall Cognitive Status: Within Functional Limits for tasks assessed                                  General Comments: Pt declined OOB activity 2/2 pain, but was able to follow conversation and participate in evaluation at bed-level.    General Comments  Pt engaged well in conversation but remained supine in bed with covers pulled to face t/o evaluation despite education provided on importance of early mobilization after sx. Pt agreeable to attempt OOB activity at next session.     Exercises Other Exercises Other Exercises: Pt educated in falls prevention strategies, safe use of AE for ADLs, importance of early mobilization after surgery, and residual limb pain mgt/desensitization strategies including gentle circular massage and gentle taping.   Shoulder Instructions      Home Living Family/patient expects to be discharged to:: Private residence Living Arrangements: Spouse/significant other;Children Available Help at Discharge: Family;Available PRN/intermittently Type of Home: Apartment Home Access: Stairs to enter Entrance Stairs-Number of Steps: 14 Entrance Stairs-Rails: Left Home Layout: One level     Bathroom Shower/Tub: Corporate investment banker: Standard Bathroom Accessibility: Yes How Accessible: Accessible via walker Home Equipment: Cane - single point;Walker - 2 wheels          Prior Functioning/Environment Level of Independence: Independent with assistive device(s)        Comments: Mod indep with SPC for ADLs, household mobilization; very limited out of house mobility.  Richard [name] and daughter assist with community needs, errands.        OT Problem List: Decreased strength;Decreased knowledge of precautions;Pain;Decreased safety awareness;Decreased activity tolerance;Decreased coordination;Decreased knowledge of use of DME or AE;Impaired sensation      OT Treatment/Interventions: Self-care/ADL training;Therapeutic exercise;Modalities;Manual therapy;Patient/family education;Neuromuscular education;Balance  training;Therapeutic activities;DME and/or AE instruction    OT Goals(Current goals can be found in the care plan section) Acute Rehab OT Goals Patient Stated Goal: To have less pain  OT Goal Formulation: With patient Time For Goal Achievement: 03/30/19 Potential to Achieve Goals: Good ADL Goals Pt Will Perform Lower Body Bathing: with set-up;with adaptive equipment;sitting/lateral leans(With LRAD for safety and improved functional independence.) Pt Will Perform Lower Body Dressing: with set-up;with adaptive equipment;sitting/lateral leans(With LRAD for safety and improved functional independence.) Pt Will Transfer to Toilet: stand pivot transfer;with min guard assist;bedside commode(With LRAD for safety and improved functional independence.) Pt Will Perform Toileting - Clothing Manipulation and hygiene: with set-up;sitting/lateral leans;with adaptive equipment(With LRAD for safety and improved functional independence.) Additional ADL Goal #1: Pt will independently verbalize a plan to implement at least 3 learned falls prevention strategies for improved safety and independence during meaningful occupations of daily life upon hospital DC.  OT Frequency: Min 2X/week   Barriers to D/C: Inaccessible home environment;Decreased caregiver support  Pt with 14 steps to enter the home and family available intermittently.        Co-evaluation              AM-PAC  OT "6 Clicks" Daily Activity     Outcome Measure Help from another person eating meals?: None Help from another person taking care of personal grooming?: A Little Help from another person toileting, which includes using toliet, bedpan, or urinal?: A Lot Help from another person bathing (including washing, rinsing, drying)?: A Lot Help from another person to put on and taking off regular upper body clothing?: A Little Help from another person to put on and taking off regular lower body clothing?: A Lot 6 Click Score: 16   End of  Session    Activity Tolerance: Patient limited by pain Patient left: in bed;with bed alarm set;with call bell/phone within reach  OT Visit Diagnosis: Other abnormalities of gait and mobility (R26.89);Repeated falls (R29.6);Pain Pain - Right/Left: Left Pain - part of body: Leg                Time: 4081-4481 OT Time Calculation (min): 24 min Charges:  OT General Charges $OT Visit: 1 Visit OT Evaluation $OT Eval Low Complexity: 1 Low OT Treatments $Self Care/Home Management : 8-22 mins  Shara Blazing, M.S., OTR/L Ascom: [phone] 03/16/19, 10:30 AM

## 2019-03-16 NOTE — Progress Notes (Signed)
ID Pt feeling good No fever , pain much better Left BKA-surgical dressing not removed Patient Vitals for the past 24 hrs:  BP Temp Temp src Pulse Resp SpO2  03/16/19 0431 (!) 151/83 99.1 F (37.3 C) - 94 18 100 %  03/15/19 2124 (!) 145/88 98.6 F (37 C) Oral (!) 101 18 100 %   CBC Latest Ref Rng & Units 03/16/2019 03/15/2019 03/14/2019  WBC 4.0 - 10.5 K/uL 18.3(H) 23.0(H) 20.8(H)  Hemoglobin 13.0 - 17.0 g/dL 8.8(L) 8.3(L) 6.7(L)  Hematocrit 39.0 - 52.0 % 26.8(L) 24.9(L) 20.9(L)  Platelets 150 - 400 K/uL 670(H) 598(H) 594(H)  Gangrene left foot and leg- s/p BKA If the wound looks then we can think of dcing zosyn Leucocytosis slowly declining

## 2019-03-17 DIAGNOSIS — I739 Peripheral vascular disease, unspecified: Secondary | ICD-10-CM

## 2019-03-17 LAB — CBC
HCT: 24.1 % — ABNORMAL LOW (ref 39.0–52.0)
Hemoglobin: 7.8 g/dL — ABNORMAL LOW (ref 13.0–17.0)
MCH: 28.3 pg (ref 26.0–34.0)
MCHC: 32.4 g/dL (ref 30.0–36.0)
MCV: 87.3 fL (ref 80.0–100.0)
Platelets: 753 10*3/uL — ABNORMAL HIGH (ref 150–400)
RBC: 2.76 MIL/uL — ABNORMAL LOW (ref 4.22–5.81)
RDW: 15.5 % (ref 11.5–15.5)
WBC: 13.7 10*3/uL — ABNORMAL HIGH (ref 4.0–10.5)
nRBC: 0.1 % (ref 0.0–0.2)

## 2019-03-17 LAB — GLUCOSE, CAPILLARY
Glucose-Capillary: 112 mg/dL — ABNORMAL HIGH (ref 70–99)
Glucose-Capillary: 223 mg/dL — ABNORMAL HIGH (ref 70–99)
Glucose-Capillary: 207 mg/dL — ABNORMAL HIGH (ref 70–99)
Glucose-Capillary: 187 mg/dL — ABNORMAL HIGH (ref 70–99)
Glucose-Capillary: 96 mg/dL (ref 70–99)
Glucose-Capillary: 171 mg/dL — ABNORMAL HIGH (ref 70–99)

## 2019-03-17 MED ORDER — APIXABAN 5 MG PO TABS
5.0000 mg | ORAL_TABLET | Freq: Two times a day (BID) | ORAL | Status: DC
  Administered 2019-03-18 – 2019-03-19 (×3): 5 mg via ORAL
  Filled 2019-03-17 (×3): qty 1

## 2019-03-17 MED ORDER — CARVEDILOL 3.125 MG PO TABS
3.1250 mg | ORAL_TABLET | Freq: Two times a day (BID) | ORAL | Status: DC
  Administered 2019-03-17 – 2019-03-19 (×4): 3.125 mg via ORAL
  Filled 2019-03-17 (×4): qty 1

## 2019-03-17 NOTE — Progress Notes (Signed)
[facility] MD Progress Note  03/17/2019 10:48 AM Richard [name]  MRN:  [id number] Subjective: "I am having a good day" Principal Problem: Respiratory failure ([place]) Diagnosis: Principal Problem:   Respiratory failure ([place]) Active Problems:   Pressure injury of skin   Cocaine use disorder ([place])  Patient seen. Chart reviewed. Total Time spent with patient: 15 minutes  HPI: Richard [name] is a 63 y.o. 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 Psychiatry consult requested to address substance use and underlying mood disorder.  On evaluation, patient is calm and cooperative.  He is in bed and states his mood as good. Patient denies any symptoms of depression, anxiety or panic attacks.  Patient denies any past self-harm.  Patient is currently denying any suicidal ideation, plan, or intent.  He denies HI.  He denies AVH.  Patient denies any symptoms of cravings for substances.  He does not desire resources for substance use treatment.  Patient vocalizes no questions or concerns  Past Psychiatric History: he denies past psych diagnoses, past psych admissions, past suicidal attempts, taking any psych medications.  Social Hx: patient reports he lives with significant other. Has children, Not working, Denies legal history. Denies access to firearms.  Past Medical History:  Past Medical History:  Diagnosis Date  . Atrial fibrillation ([place])    per patient  . Cancer ([place])   . CHF (congestive heart failure) ([place])   . Chronic back pain   . Chronic leg pain   . Coronary artery disease   . Diabetes mellitus without complication ([place])   . Hypertension   . Neuropathy     Past Surgical History:  Procedure Laterality Date  . AMPUTATION Left 03/13/2019   Procedure: AMPUTATION BELOW KNEE;  Surgeon: Algernon Huxley, MD;  Location: [facility] ORS;  Service: Vascular;  Laterality: Left;  .  CORONARY ANGIOPLASTY WITH STENT PLACEMENT    . EMBOLECTOMY Left 03/12/2019   Procedure: EMBOLECTOMY SFA POPLITEAL;  Surgeon: Algernon Huxley, MD;  Location: [facility] ORS;  Service: Vascular;  Laterality: Left;  . ENDARTERECTOMY FEMORAL Left 03/12/2019   Procedure: ENDARTERECTOMY FEMORAL;  Surgeon: Algernon Huxley, MD;  Location: [facility] ORS;  Service: Vascular;  Laterality: Left;  . ESOPHAGOGASTRODUODENOSCOPY N/A 09/11/2017   Procedure: ESOPHAGOGASTRODUODENOSCOPY (EGD);  Surgeon: Virgel Manifold, MD;  Location: [facility] ENDOSCOPY;  Service: Endoscopy;  Laterality: N/A;  . LOWER EXTREMITY ANGIOGRAPHY Left 11/17/2018   Procedure: Lower Extremity Angiography, with possible intervention;  Surgeon: Algernon Huxley, MD;  Location: [facility] CV LAB;  Service: Cardiovascular;  Laterality: Left;  . LOWER EXTREMITY ANGIOGRAPHY Left 03/04/2019   Procedure: Lower Extremity Angiography;  Surgeon: Algernon Huxley, MD;  Location: [place] CV LAB;  Service: Cardiovascular;  Laterality: Left;  . pancreas removed     Family History:  Family History  Problem Relation Age of Onset  . CAD Mother   . Diabetes Mellitus II Mother   . Diabetes Mellitus II Father    Family Psychiatric  History:  denies any psych history and suicides in family members.  Social History:  Social History   Substance and Sexual Activity  Alcohol Use No  . Alcohol/week: 0.0 standard drinks     Social History   Substance and Sexual Activity  Drug Use Yes  . Types: Cocaine   Comment: Has used in the past-heroin. Cocaine used over a year  ago per pt.    Social History   Socioeconomic History  . Marital status: Single    Spouse name: Not on file  . Number of children: Not on file  . Years of education: Not on file  . Highest education level: Not on file  Occupational History  . Not on file  Social Needs  . Financial resource strain: Not on file  . Food insecurity:    Worry: Not on file    Inability: Not on file  . Transportation  needs:    Medical: Not on file    Non-medical: Not on file  Tobacco Use  . Smoking status: Current Every Day Smoker    Packs/day: 0.50  . Smokeless tobacco: Never Used  Substance and Sexual Activity  . Alcohol use: No    Alcohol/week: 0.0 standard drinks  . 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 Cocaine used over a year ago per pt.  . Sexual activity: Not on file  Lifestyle  . Physical activity:    Days per week: Not on file    Minutes per session: Not on file  . Stress: Not on file  Relationships  . Social connections:    Talks on phone: Not on file    Gets together: Not on file    Attends religious service: Not on file    Active member of club or organization: Not on file    Attends meetings of clubs or organizations: Not on file    Relationship status: Not on file  Other Topics Concern  . Not on file  Social History Narrative  . Not on file   Additional Social History:       Lives with fiance'                  Sleep: Fair  Appetite:  Good  Current Medications: Current Facility-Administered Medications  Medication Dose Route Frequency Provider Last Rate Last Dose  . 0.9 %  sodium chloride infusion   Intravenous Continuous Algernon Huxley, MD 75 mL/hr at 03/17/19 [phone]    . acetaminophen (TYLENOL) tablet 650 mg  650 mg Oral Q6H Griffin, Crystal, NP   650 mg at 03/16/19 2235  . aspirin chewable tablet 81 mg  81 mg Oral Daily Algernon Huxley, MD   81 mg at 03/16/19 0810  . cyclobenzaprine (FLEXERIL) tablet 10 mg  10 mg Oral TID Asencion Gowda, NP   10 mg at 03/16/19 2040  . enoxaparin (LOVENOX) injection 40 mg  40 mg Subcutaneous Q24H Algernon Huxley, MD   40 mg at 03/16/19 0810  . gabapentin (NEURONTIN) capsule 100 mg  100 mg Oral TID Asencion Gowda, NP   100 mg at 03/16/19 2040  . HYDROmorphone (DILAUDID) injection 1 mg  1 mg Intravenous Q4H PRN Asencion Gowda, NP   1 mg at 03/17/19 4562  . insulin aspart (novoLOG) injection  0-15 Units  0-15 Units Subcutaneous TID WC Algernon Huxley, MD   5 Units at 03/17/19 [id number]  . insulin aspart (novoLOG) injection 0-5 Units  0-5 Units Subcutaneous QHS Algernon Huxley, MD      . insulin aspart (novoLOG) injection 10 Units  10 Units Subcutaneous TID WC Algernon Huxley, MD   10 Units at 03/16/19 0809  . insulin glargine (LANTUS) injection 19 Units  19 Units Subcutaneous Daily Algernon Huxley, MD   19 Units at 03/16/19 [id number]  . MEDLINE  mouth rinse  15 mL Mouth Rinse BID Algernon Huxley, MD   15 mL at 03/16/19 2041  . naloxone [facility]) injection 0.4 mg  0.4 mg Intravenous PRN Algernon Huxley, MD      . ondansetron [facility]) injection 4 mg  4 mg Intravenous Q6H PRN Algernon Huxley, MD   4 mg at 03/12/19 7209  . oxyCODONE-acetaminophen (PERCOCET/ROXICET) 5-325 MG per tablet 1 tablet  1 tablet Oral Q6H PRN Asencion Gowda, NP   1 tablet at 03/17/19 0420  . piperacillin-tazobactam (ZOSYN) IVPB 3.375 g  3.375 g Intravenous Q8H Algernon Huxley, MD 12.5 mL/hr at 03/17/19 0733 3.375 g at 03/17/19 0733  . protein supplement (ENSURE MAX) liquid  11 oz Oral BID BM Algernon Huxley, MD   11 oz at 03/16/19 0809  . rosuvastatin (CRESTOR) tablet 40 mg  40 mg Oral q1800 Algernon Huxley, MD   40 mg at 03/16/19 1820  . sodium chloride flush (NS) 0.9 % injection 10 mL  10 mL Intravenous Q12H Algernon Huxley, MD   10 mL at 03/16/19 2113  . sodium chloride flush (NS) 0.9 % injection 10 mL  10 mL Intravenous PRN Algernon Huxley, MD   10 mL at 03/06/19 1816  . ticagrelor (BRILINTA) tablet 90 mg  90 mg Oral BID [name], MD   90 mg at 03/16/19 2040    Lab Results:  Results for orders placed or performed during the hospital encounter of 02/28/19 (from the past 48 hour(s))  Glucose, capillary     Status: Abnormal   Collection Time: 03/15/19 12:10 PM  Result Value Ref Range   Glucose-Capillary 162 (H) 70 - 99 mg/dL  Glucose, capillary     Status: Abnormal   Collection Time: 03/15/19  4:34 PM  Result Value Ref Range   Glucose-Capillary 63 (L)  70 - 99 mg/dL  Glucose, capillary     Status: Abnormal   Collection Time: 03/15/19  5:04 PM  Result Value Ref Range   Glucose-Capillary 69 (L) 70 - 99 mg/dL  Glucose, capillary     Status: Abnormal   Collection Time: 03/15/19  6:11 PM  Result Value Ref Range   Glucose-Capillary 104 (H) 70 - 99 mg/dL  Glucose, capillary     Status: Abnormal   Collection Time: 03/15/19  9:27 PM  Result Value Ref Range   Glucose-Capillary 165 (H) 70 - 99 mg/dL  CBC     Status: Abnormal   Collection Time: 03/16/19  5:45 AM  Result Value Ref Range   WBC 18.3 (H) 4.0 - 10.5 K/uL   RBC 3.03 (L) 4.22 - 5.81 MIL/uL   Hemoglobin 8.8 (L) 13.0 - 17.0 g/dL   HCT 26.8 (L) 39.0 - 52.0 %   MCV 88.4 80.0 - 100.0 fL   MCH 29.0 26.0 - 34.0 pg   MCHC 32.8 30.0 - 36.0 g/dL   RDW 15.6 (H) 11.5 - 15.5 %   Platelets 670 (H) 150 - 400 K/uL   nRBC 0.0 0.0 - 0.2 %    Comment: Performed at [facility], [address] [place], [place] [address]  Basic metabolic panel     Status: Abnormal   Collection Time: 03/16/19  5:45 AM  Result Value Ref Range   Sodium 136 135 - 145 mmol/L   Potassium 4.1 3.5 - 5.1 mmol/L   Chloride 102 98 - 111 mmol/L   CO2 25 22 - 32 mmol/L   Glucose, Bld 151 (H)  70 - 99 mg/dL   BUN 11 8 - 23 mg/dL   Creatinine, Ser 0.69 0.61 - 1.24 mg/dL   Calcium 8.0 (L) 8.9 - 10.3 mg/dL   GFR calc non Af Amer >60 >60 mL/min   GFR calc Af Amer >60 >60 mL/min   Anion gap 9 5 - 15    Comment: Performed at [facility], [address] [place], [place] [address]  Glucose, capillary     Status: Abnormal   Collection Time: 03/16/19  7:37 AM  Result Value Ref Range   Glucose-Capillary 152 (H) 70 - 99 mg/dL  Glucose, capillary     Status: None   Collection Time: 03/16/19 11:36 AM  Result Value Ref Range   Glucose-Capillary 91 70 - 99 mg/dL  Glucose, capillary     Status: Abnormal   Collection Time: 03/16/19  5:05 PM  Result Value Ref Range   Glucose-Capillary 120 (H) 70 - 99 mg/dL   Glucose, capillary     Status: Abnormal   Collection Time: 03/16/19  8:58 PM  Result Value Ref Range   Glucose-Capillary 165 (H) 70 - 99 mg/dL  Glucose, capillary     Status: Abnormal   Collection Time: 03/17/19  3:06 AM  Result Value Ref Range   Glucose-Capillary 223 (H) 70 - 99 mg/dL  CBC     Status: Abnormal   Collection Time: 03/17/19  5:39 AM  Result Value Ref Range   WBC 13.7 (H) 4.0 - 10.5 K/uL   RBC 2.76 (L) 4.22 - 5.81 MIL/uL   Hemoglobin 7.8 (L) 13.0 - 17.0 g/dL   HCT 24.1 (L) 39.0 - 52.0 %   MCV 87.3 80.0 - 100.0 fL   MCH 28.3 26.0 - 34.0 pg   MCHC 32.4 30.0 - 36.0 g/dL   RDW 15.5 11.5 - 15.5 %   Platelets 753 (H) 150 - 400 K/uL   nRBC 0.1 0.0 - 0.2 %    Comment: Performed at [facility], [address] [place], [place] [address]  Glucose, capillary     Status: Abnormal   Collection Time: 03/17/19  7:52 AM  Result Value Ref Range   Glucose-Capillary 207 (H) 70 - 99 mg/dL   Comment 1 Notify RN     Blood Alcohol level:  Lab Results  Component Value Date   ETH <10 02/28/2019   ETH <10 09/32/3557    Metabolic Disorder Labs: Lab Results  Component Value Date   HGBA1C >15.5 (H) 02/28/2019   MPG >398 02/28/2019   MPG 378.06 11/11/2018   No results found for: PROLACTIN Lab Results  Component Value Date   CHOL 186 08/10/2015   TRIG 144 08/10/2015   HDL 25 (L) 08/10/2015   CHOLHDL 7.4 08/10/2015   VLDL 29 08/10/2015   LDLCALC 132 (H) 08/10/2015    Physical Findings: AIMS:  , ,  ,  ,    CIWA:    COWS:     Musculoskeletal: Strength & Muscle Tone: within normal limits Gait & Station: left BKA with dressing in place. Patient leans: N/A  Psychiatric Specialty Exam: Physical Exam  Constitutional: He is oriented to person, place, and time. He appears well-developed and well-nourished. No distress.  HENT:  Head: Normocephalic and atraumatic.  Eyes: EOM are normal.  Neck: Normal range of motion.  Cardiovascular: Normal rate.  Respiratory:  Effort normal. No respiratory distress.  Musculoskeletal: Normal range of motion.     Comments: Left BKA with dressing dry and intact  Neurological: He is alert and oriented to person, place, and time.    Review of Systems  Constitutional: Negative.   Respiratory: Negative.   Cardiovascular: Negative.   Musculoskeletal:       Post-operative pain  Neurological: Negative.   Psychiatric/Behavioral: Positive for substance abuse (cocaine). Negative for depression, hallucinations, memory loss and suicidal ideas. The patient is not nervous/anxious and does not have insomnia.     Blood pressure 136/74, pulse 94, temperature 98.7 F (37.1 C), temperature source Oral, resp. rate 18, height 5\' 5"  (1.651 m), weight 57.3 kg, SpO2 100 %.Body mass index is 21.02 kg/m.  General Appearance: Neat  Eye Contact:  Good  Speech:  Clear and Coherent and Normal Rate  Volume:  Normal  Mood:  Euthymic  Affect:  Appropriate and Congruent  Thought Process:  Goal Directed and Linear  Orientation:  Full (Time, Place, and Person)  Thought Content:  Logical and Hallucinations: None  Suicidal Thoughts:  No  Homicidal Thoughts:  No  Memory:  good  Judgement:  Fair  Insight:  Shallow  Psychomotor Activity:  Normal  Concentration:  Concentration: Fair  Recall:  Good  Fund of Knowledge:  Fair  Language:  Good  Akathisia:  No  Handed:  Right  AIMS (if indicated):     Assets:  Communication Skills Housing Intimacy Social Support  ADL's:  Impaired  Cognition:  WNL  Sleep:   adequate     Treatment Plan Summary: Daily contact with patient to assess and evaluate symptoms and progress in treatment   Disposition: No evidence of imminent risk to self or others at present.   Patient does not meet criteria for psychiatric inpatient admission. Supportive therapy provided about ongoing stressors.   Assessment: Richard [name] is a 63 y.o. 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 Psychiatry consult requested to address substance use and underlying mood disorder.  Impression: Cocaine use disorder, moderate.  Patient denies feeling depressed, anxious, suicidal, homicidal, he does not appear psychotic, gravely-disabled or at risk to harm self/others, so he does not meet criteria for inpatient psych admission. Regarding substance use, patient is in precontemplative stage and does not see his cocaine use as a problem, possibly minimizes his substance use. He refuses any chem-dependency referrals, resources and is not interested in treatment at this time. Patient notified that resources can be provided if he changes his mind. Patient expressed his agreement and understanding to the same.  Lavella Hammock, MD 03/17/2019, 10:48 AM

## 2019-03-17 NOTE — Progress Notes (Signed)
Physical Therapy Treatment Patient Details Name: Richard [name] MRN: [id number] DOB: [birth year] Today's Date: 03/17/2019    History of Present Illness 63 y.o. male with PMH: periph vascular disease, MI, a-fib, anemia, DM, cocaine use, s/p L BKA (5/22) due to gangrene and sepsis.     PT Comments    Pt initially eager to try and get up and do some ambulation but generally did poorly, had regular impulsivity and overall was unable to truly do any safe standing activity this date.  On first attempt he could not keep hips hips forward and could not make appropriate adjustments for shift hips forward, get weight forward on walker or position R LE under his COG.  Pt fatigued with just a few small hopping attempts and needed prolonged rest break. Next attempt went poorly with 2 or 3 "good" hops initially and then involuntary contraction of L LE and R LE buckling with impulsive/anxious response needing total assist to safely return to recliner.  After another long rest break and plenty of repeated cuing and encouragement he managed some anxious and labored turning hop steps to get back to bed but ultimately was too pain and fear focused to do any more. Educated pt on appropriate post-amputation positioning, importance of exercises as well as need to remain focused during standing tasks (transfer/ambulation/etc).  Pt with significant safety concerns today.   Follow Up Recommendations  SNF     Equipment Recommendations  None recommended by PT    Recommendations for Other Services       Precautions / Restrictions Precautions Precautions: Fall Restrictions Weight Bearing Restrictions: (acute L BKA, NWBing) LLE Weight Bearing: Non weight bearing    Mobility  Bed Mobility Overal bed mobility: Needs Assistance Bed Mobility: Supine to Sit     Supine to sit: Min assist     General bed mobility comments: Pt struggled to get LEs back into bed, grimacing in pain while using UEs to get L LE into  bed  Transfers Overall transfer level: Needs assistance Equipment used: Rolling walker (2 wheeled) Transfers: Sit to/from Stand Sit to Stand: Mod assist;Max assist;Min assist         General transfer comment: First attempt at standing pt needed mod/max assist despite a lot of cuing for set up (U&LEs), sequencing and weight shift strategies.  Next 2 attempts with less assist, but still unsteady and clearly requiring at least some assist.  Ambulation/Gait Ambulation/Gait assistance: Max assist Gait Distance (Feet): 4 Feet Assistive device: Rolling walker (2 wheeled)       General Gait Details: Pt did poorly with ambulation attempts.  First time he managed 2 very small hops with inability to get weight forward onto the walker and poor overall awareness of positioning despite heavy cuing.  Next attempt he was able to take 2 relatively safe hops forward but then had involuntary contraction of L knee and started to become very anxious.  He was able to calm, maintain balance relatively well and try another hop, during this large step (despite cues to take small step) he again had L pain/spasm and needed total assist to maintain standing. Pt with R knee buckling, forward lean on walker and PT had to hold him total assist to get back onto recliner safely.  3rd attempt went better but pt was too worked up to do more than a (still difficult) STP back to bed.    Stairs             Emergency planning/management officer  Modified Rankin (Stroke Patients Only)       Balance Overall balance assessment: Needs assistance Sitting-balance support: No upper extremity supported Sitting balance-Leahy Scale: Good       Standing balance-Leahy Scale: Poor Standing balance comment: Pt could not consistently shift weight onto walker and showed poor awreness and some impulsivity with standing acts                            Cognition Arousal/Alertness: Awake/alert Behavior During Therapy: WFL for  tasks assessed/performed Overall Cognitive Status: Within Functional Limits for tasks assessed                                        Exercises Other Exercises Other Exercises: Pt too anxious, fatigued and in pain after multiple mobility/ambulation attempts that he refused to do any further PT/exercises once back to bed    General Comments        Pertinent Vitals/Pain Pain Assessment: 0-10 Pain Score: 7 (10/10 with any real activity, throbbing in GravDepPos) Pain Location: L leg at amputation site Pain Descriptors / Indicators: Guarding;Grimacing;Constant    Home Living                      Prior Function            PT Goals (current goals can now be found in the care plan section) Progress towards PT goals: Not progressing toward goals - comment(Pt less confident with standing this date, poor safety aware)    Frequency    7X/week      PT Plan Current plan remains appropriate    Co-evaluation              AM-PAC PT "6 Clicks" Mobility   Outcome Measure  Help needed turning from your back to your side while in a flat bed without using bedrails?: A Lot Help needed moving from lying on your back to sitting on the side of a flat bed without using bedrails?: A Lot Help needed moving to and from a bed to a chair (including a wheelchair)?: A Lot Help needed standing up from a chair using your arms (e.g., wheelchair or bedside chair)?: A Lot Help needed to walk in hospital room?: Total Help needed climbing 3-5 steps with a railing? : Total 6 Click Score: 10    End of Session Equipment Utilized During Treatment: Gait belt Activity Tolerance: Patient limited by pain Patient left: with bed alarm set;with call bell/phone within reach   PT Visit Diagnosis: Unsteadiness on feet (R26.81);Other abnormalities of gait and mobility (R26.89);Muscle weakness (generalized) (M62.81);Difficulty in walking, not elsewhere classified (R26.2)     Time:  3151-7616 PT Time Calculation (min) (ACUTE ONLY): 41 min  Charges:  $Gait Training: 8-22 mins $Therapeutic Activity: 23-37 mins                     Kreg Shropshire, DPT 03/17/2019, 1:26 PM

## 2019-03-17 NOTE — Progress Notes (Signed)
Nutrition Follow-up  RD working remotely.  DOCUMENTATION CODES:   Not applicable  INTERVENTION:  Continue Ensure Max Protein po BID, each supplement provides 150 kcal and 30 grams of protein.  NUTRITION DIAGNOSIS:   Inadequate oral intake related to inability to eat as evidenced by NPO status.  Resolving - intake improving.  GOAL:   Patient will meet greater than or equal to 90% of their needs  Progressing.  MONITOR:   PO intake, Supplement acceptance, Labs, Weight trends, Skin, I & O's  REASON FOR ASSESSMENT:   Ventilator, Consult Enteral/tube feeding initiation and management  ASSESSMENT:   63 year old male with PMHx of HTN, DM, CAD, neuropathy, chronic back pain, chronic leg pain, CHF, A-fib, hx cocaine abuse, hx pancreatic cancer s/p Whipple procedure who is admitted with AMS, hyperthermia, DKA, respiratory failure requiring intubation on 5/9, also with gangrenous left great toe.   -Patient s/p left common femoral and profunda femoris endarterectomies and patch angioplasty + fogarty embolectomy of left superficial femoral and popliteal arteries on 5/21. -Patient s/p left BKA on 5/22.  Patient continues to have good appetite. He is eating 100% of most meals and occasionally has 50% of meal. He is drinking 1-2 bottles per day of Ensure Max Protein.  Medications reviewed and include: gabapentin, Novolog 0-15 units TID, Novolog 0-5 units QHS, Novolog 10 units TID, Lantus 19 units daily, Crestor, NS @ 75 mL/hr, Zosyn.  Labs reviewed: CBG 120-223.  Diet Order:   Diet Order            Diet heart healthy/carb modified Room service appropriate? Yes; Fluid consistency: Thin  Diet effective now             EDUCATION NEEDS:   No education needs have been identified at this time  Skin:  Skin Assessment: Skin Integrity Issues:(s/p left BKA; stg II left buttocks; stg II coccyx)  Last BM:  03/16/2019 - large type 4  Height:   Ht Readings from Last 1 Encounters:   03/04/19 5\' 5"  (1.651 m)   Weight:   Wt Readings from Last 1 Encounters:  03/04/19 57.3 kg   Ideal Body Weight:  61.8 kg  BMI:  Body mass index is 21.02 kg/m.  Estimated Nutritional Needs:   Kcal:  1680-1940 (MSJ x 1.3-1.5)  Protein:  85-105 grams (1.5-1.8 grams/kg)  Fluid:  1.8 L/day (30 mL/kg)  Willey Blade, MS, RD, LDN Office: [phone] Pager: [phone] After Hours/Weekend Pager: [phone]

## 2019-03-17 NOTE — Progress Notes (Addendum)
[facility] [place] NAME: Richard [name]    MR#:  [id number]  DATE OF BIRTH:  June 16, 1956  SUBJECTIVE:   no acute events overnight  REVIEW OF SYSTEMS:    Review of Systems  Constitutional: Negative for fever, chills weight loss HENT: Negative for ear pain, nosebleeds, congestion, facial swelling, rhinorrhea, neck pain, neck stiffness and ear discharge.   Respiratory: Negative for cough, shortness of breath, wheezing  Cardiovascular: Negative for chest pain, palpitations and leg swelling.  Gastrointestinal: Negative for heartburn, abdominal pain, vomiting, diarrhea or consitpation Genitourinary: Negative for dysuria, urgency, frequency, hematuria Musculoskeletal: Negative for back pain or joint pain Neurological: Negative for dizziness, seizures, syncope, focal weakness,  numbness and headaches.  Hematological: Does not bruise/bleed easily.  Psychiatric/Behavioral: Negative for hallucinations, confusion, dysphoric mood    Tolerating Diet: yes      DRUG ALLERGIES:  No Known Allergies  VITALS:  Blood pressure 136/74, pulse 94, temperature 98.7 F (37.1 C), temperature source Oral, resp. rate 18, height 5\' 5"  (1.651 m), weight 57.3 kg, SpO2 100 %.  PHYSICAL EXAMINATION:  Constitutional: Appears well-developed and well-nourished. No distress. HENT: Normocephalic. Marland Kitchen Oropharynx is clear and moist.  Eyes: Conjunctivae and EOM are normal. PERRLA, no scleral icterus.  Neck: Normal ROM. Neck supple. No JVD. No tracheal deviation. CVS: RRR, S1/S2 +, no murmurs, no gallops, no carotid bruit.  Pulmonary: Effort and breath sounds normal, no stridor, rhonchi, wheezes, rales.  Abdominal: Soft. BS +,  no distension, tenderness, rebound or guarding.  Musculoskeletal: left BKA No edema and no tenderness.  Neuro: Alert. CN 2-12 grossly intact. No focal deficits. Skin: sacral pressure injury POAPsychiatric: Normal mood and affect.       LABORATORY PANEL:   CBC Recent Labs  Lab 03/17/19 0539  WBC 13.7*  HGB 7.8*  HCT 24.1*  PLT 753*   ------------------------------------------------------------------------------------------------------------------  Chemistries  Recent Labs  Lab 03/13/19 0512  03/16/19 0545  NA 135   < > 136  K 4.2   < > 4.1  CL 100   < > 102  CO2 27   < > 25  GLUCOSE 362*   < > 151*  BUN 25*   < > 11  CREATININE 0.76   < > 0.69  CALCIUM 7.8*   < > 8.0*  MG 2.3  --   --    < > = values in this interval not displayed.   ------------------------------------------------------------------------------------------------------------------  Cardiac Enzymes No results for input(s): TROPONINI in the last 168 hours. ------------------------------------------------------------------------------------------------------------------  RADIOLOGY:  No results found.   ASSESSMENT AND PLAN:    63 year old male with PAF on Eliquis, PVD and diabetes who presented to the emergency room due to unresponsiveness and acute hypoxic respiratory failure.  1.  Sepsis: Patient presented with leukocytosis and hypotension.  Sepsis due to Proteus vulgaris bacteremia due to gangrenous toes.  Patient evaluated by infectious disease. Patient treated with IV antibiotics.  Okay to discontinue Zosyn today.   2.  Severe peripheral vascular disease status post left femoral endarterectomy and left BKA POD #4 OR dressings removed by vascular surgery this morning.  Surgical sites healing well. Patient will need outpatient follow-up in 2 weeks to start staple removal.  3.  Acute hypoxic respiratory failure on admission status post intubation/extubation: No acute events.  4.  Status post NSTEMI with inferoposterior myocardial infarction: Conservative management as per cardiology. Continue Brilinta, aspirin and statin.  5.  Cocaine use disorder: Patient evaluated by psychiatry.  He does not meet inpatient psych  admission. Patient will try to minimize substance abuse  6.  PAF: Continue aspirin and Brilinta  7.  Diabetes: Continue sliding scale with Lantus  8.  Acute kidney injury which improved with IV fluids  9.  History of pancreatic cancer 10.  Anemia of chronic disease: Patient status post 4 unit PRBC.  CBC for a.m.    Management plans discussed with the patient and he is in agreement.  CODE STATUS: DNR  TOTAL TIME TAKING CARE OF THIS PATIENT: 28 minutes.     POSSIBLE D/C awaiting insurance approval/palcement   Bettey Costa M.D on 03/17/2019 at 11:50 AM  Between 7am to 6pm - Pager - [phone] After 6pm go to [email or web address] - password [email or web address] [facility]  Office  [phone]  CC: Primary care physician; Center, [place]  Note: This dictation was prepared with Diplomatic Services operational officer dictation along with smaller phrase technology. Any transcriptional errors that result from this process are unintentional.

## 2019-03-17 NOTE — NC FL2 (Signed)
[place] LEVEL OF CARE SCREENING TOOL     IDENTIFICATION  Patient Name: Richard [name] Birthdate: 1956-09-05 Sex: male Admission Date (Current Location): 02/28/2019  [place] and [place] Number:  Engineering geologist and Address:  [facility], [address], [place], [place] [address]      Provider Number: [id number]  Attending Physician Name and Address:  Bettey Costa, MD  Relative Name and Phone Number:       Current Level of Care: Hospital Recommended Level of Care: [place] Prior Approval Number:    Date Approved/Denied:   PASRR Number: [id number]  Discharge Plan: SNF    Current Diagnoses: Patient Active Problem List   Diagnosis Date Noted  . Cocaine use disorder ([place])   . Pressure injury of skin 03/12/2019  . Respiratory failure ([place]) 02/28/2019  . Diabetes with hyperosmolar coma ([place]) 11/11/2018  . A-fib ([place]) 09/20/2018  . Hyperglycemia 08/03/2018  . Protein-calorie malnutrition, severe 01/10/2018  . Sepsis ([place]) 01/09/2018  . DKA, type 2 ([place]) 09/05/2017  . Ileus ([place]) 08/24/2017  . Diabetes ([place]) 08/24/2017  . Atrial flutter ([place]) 08/24/2017  . CAD (coronary artery disease) 08/24/2017  . HTN (hypertension) 08/24/2017  . Atrial fibrillation ([place]) 08/24/2017  . Tobacco use disorder, severe, dependence 07/25/2017  . Wound dehiscence, surgical, initial encounter 06/24/2017  . Acute hematogenous osteomyelitis of right foot ([place]) 06/20/2017  . Adenocarcinoma of pancreas ([place]) 06/20/2017  . Esophagitis 06/20/2017  . Cellulitis of right lower extremity 06/10/2017  . Hypertension, poor control 06/10/2017  . Insulin dependent type 2 diabetes mellitus ([place]) 06/10/2017  . Chest pain 08/10/2015    Orientation RESPIRATION BLADDER Height & Weight     Self, Time, Place  Normal Continent Weight: 126 lb 5.2 oz (57.3 kg) Height:  5\' 5"  (165.1 cm)  BEHAVIORAL SYMPTOMS/MOOD NEUROLOGICAL BOWEL NUTRITION STATUS   (none) (none) Continent Diet(Heart Healthy )  AMBULATORY STATUS COMMUNICATION OF NEEDS Skin   Extensive Assist Verbally Surgical wounds(Left BKA )                       Personal Care Assistance Level of Assistance  Bathing, Feeding, Dressing Bathing Assistance: Limited assistance Feeding assistance: Independent Dressing Assistance: Limited assistance     Functional Limitations Info  Sight, Hearing, Speech Sight Info: Adequate Hearing Info: Adequate Speech Info: Adequate    SPECIAL CARE FACTORS FREQUENCY  PT (By licensed PT), OT (By licensed OT)     PT Frequency: 5 OT Frequency: 5            Contractures Contractures Info: Not present    Additional Factors Info  Code Status, Allergies Code Status Info: DNR Allergies Info: NKA           Current Medications (03/17/2019):  This is the current hospital active medication list Current Facility-Administered Medications  Medication Dose Route Frequency Provider Last Rate Last Dose  . 0.9 %  sodium chloride infusion   Intravenous Continuous Algernon Huxley, MD 75 mL/hr at 03/17/19 [phone]    . acetaminophen (TYLENOL) tablet 650 mg  650 mg Oral Q6H Griffin, Crystal, NP   650 mg at 03/17/19 1749  . [START ON 03/18/2019] apixaban (ELIQUIS) tablet 5 mg  5 mg Oral BID Mody, Sital, MD      . aspirin chewable tablet 81 mg  81 mg Oral Daily Algernon Huxley, MD   81 mg at 03/17/19 1054  . carvedilol (COREG) tablet 3.125 mg  3.125 mg  Oral BID WC Bettey Costa, MD   3.125 mg at 03/17/19 1748  . cyclobenzaprine (FLEXERIL) tablet 10 mg  10 mg Oral TID Asencion Gowda, NP   10 mg at 03/17/19 1748  . gabapentin (NEURONTIN) capsule 100 mg  100 mg Oral TID Asencion Gowda, NP   100 mg at 03/17/19 1748  . HYDROmorphone (DILAUDID) injection 1 mg  1 mg Intravenous Q4H PRN Asencion Gowda, NP   1 mg at 03/17/19 1404  . insulin aspart (novoLOG) injection 0-15 Units  0-15 Units Subcutaneous TID WC Algernon Huxley, MD   3 Units at 03/17/19 1223  .  insulin aspart (novoLOG) injection 0-5 Units  0-5 Units Subcutaneous QHS Algernon Huxley, MD      . insulin aspart (novoLOG) injection 10 Units  10 Units Subcutaneous TID WC Algernon Huxley, MD   10 Units at 03/16/19 0809  . insulin glargine (LANTUS) injection 19 Units  19 Units Subcutaneous Daily Algernon Huxley, MD   19 Units at 03/17/19 1054  . MEDLINE mouth rinse  15 mL Mouth Rinse BID Algernon Huxley, MD   15 mL at 03/17/19 1054  . naloxone (NARCAN) injection 0.4 mg  0.4 mg Intravenous PRN Algernon Huxley, MD      . ondansetron [facility]) injection 4 mg  4 mg Intravenous Q6H PRN Algernon Huxley, MD   4 mg at 03/12/19 9480  . oxyCODONE-acetaminophen (PERCOCET/ROXICET) 5-325 MG per tablet 1 tablet  1 tablet Oral Q6H PRN Asencion Gowda, NP   1 tablet at 03/17/19 1939  . protein supplement (ENSURE MAX) liquid  11 oz Oral BID BM Algernon Huxley, MD   11 oz at 03/17/19 1403  . rosuvastatin (CRESTOR) tablet 40 mg  40 mg Oral q1800 Algernon Huxley, MD   40 mg at 03/17/19 1749  . sodium chloride flush (NS) 0.9 % injection 10 mL  10 mL Intravenous Q12H Algernon Huxley, MD   10 mL at 03/16/19 2113  . sodium chloride flush (NS) 0.9 % injection 10 mL  10 mL Intravenous PRN Algernon Huxley, MD   10 mL at 03/06/19 1816  . ticagrelor (BRILINTA) tablet 90 mg  90 mg Oral BID [name], MD   90 mg at 03/17/19 1053     Discharge Medications: Please see discharge summary for a list of discharge medications.  Relevant Imaging Results:  Relevant Lab Results:   Additional Information SSN: [id number]  Annamaria Boots, [place]

## 2019-03-17 NOTE — Care Management Important Message (Signed)
Important Message  Patient Details  Name: Richard [name] MRN: [id number] Date of Birth: Mar 03, 1956   Medicare Important Message Given:  Yes    Juliann Pulse A Harmonie Verrastro 03/17/2019, 10:34 AM

## 2019-03-17 NOTE — Progress Notes (Signed)
[facility] Daily Progress Note   Subjective: 03/13/19: 1. Left below-the-knee amputation  03/12/19: 1. Left common femoral and profunda femoris endarterectomies and patch angioplasty 2.  Fogarty embolectomy of the left superficial femoral and popliteal arteries  03/04/19: 1. Ultrasound guidance for vascular access right femoral artery 2. Catheter placement into left common femoral artery from right femoral approach 3. Aortogram and selective left lower extremity angiogram 4. Percutaneous transluminal angioplasty of left anterior tibial artery with 3 mm diameter and 4 mm diameter angioplasty balloons 5.  Percutaneous transluminal angioplasty of the left SFA and popliteal arteries with 5 mm diameter Lutonix drug-coated angioplasty balloon and 6 mm diameter high-pressure angioplasty balloons             6.  Mechanical thrombectomy using the penumbra cat 6 device to the left common femoral artery, SFA, popliteal artery, and anterior tibial arteries             7.  Viabahn stent placement to the proximal left SFA with a 6 mm diameter by 7.5 cm length covered stent             8.  Upon stent placement to the left anterior tibial artery with 5 mm diameter by 10 cm length covered stent 9. StarClose closure device right femoral artery  Without complaint with exception of stump pain.   Objective: Vitals:   03/15/19 2124 03/16/19 0431 03/16/19 2051 03/17/19 0309  BP: (!) 145/88 (!) 151/83 107/65 136/74  Pulse: (!) 101 94 85 94  Resp: 18 18 18 18   Temp: 98.6 F (37 C) 99.1 F (37.3 C) 98.5 F (36.9 C) 98.7 F (37.1 C)  TempSrc: Oral  Oral Oral  SpO2: 100% 100% 100% 100%  Weight:      Height:        Intake/Output Summary (Last 24 hours) at 03/17/2019 1019 Last data filed at 03/17/2019 1007 Gross per 24 hour  Intake 972.1 ml  Output 800 ml  Net 172.1 ml   Physical Exam: A&Ox3, NAD CV:  RRR Pulmonary: CTA Bilaterally Abdomen: Soft, Nontender, Nondistended Left Groin Incision: OR dressing removed. Sutures: clean, dry and intact. No bruising or swelling.  Left Lower Extremity: Thigh soft. Knee flexible at the joint. OR dressing removed. Staple line: clean, dry and intact. Old bloody drainage on dressing no active bleeding noted   Laboratory: CBC    Component Value Date/Time   WBC 13.7 (H) 03/17/2019 0539   HGB 7.8 (L) 03/17/2019 0539   HCT 24.1 (L) 03/17/2019 0539   PLT 753 (H) 03/17/2019 0539   BMET    Component Value Date/Time   NA 136 03/16/2019 0545   K 4.1 03/16/2019 0545   CL 102 03/16/2019 0545   CO2 25 03/16/2019 0545   GLUCOSE 151 (H) 03/16/2019 0545   BUN 11 03/16/2019 0545   CREATININE 0.69 03/16/2019 0545   CALCIUM 8.0 (L) 03/16/2019 0545   GFRNONAA >60 03/16/2019 0545   GFRAA >60 03/16/2019 0545   Assessment/Planning: The patient is a 63 year old with multiple medical issues including PAD s/p left femoral endarterectomy and left BKA 1) OR dressings removed. Surgical sites healing well.  2) Dispo to SNF pending insurance approval 3) OK to discharge from vascular perspective when medically stable 4) Follow up as an outpatient in two weeks to start staple removal  Discussed with Dr. Ellis Parents Senon Nixon PA-C 03/17/2019 10:19 AM

## 2019-03-18 LAB — CBC
HCT: 25.2 % — ABNORMAL LOW (ref 39.0–52.0)
Hemoglobin: 7.9 g/dL — ABNORMAL LOW (ref 13.0–17.0)
MCH: 27.9 pg (ref 26.0–34.0)
MCHC: 31.3 g/dL (ref 30.0–36.0)
MCV: 89 fL (ref 80.0–100.0)
Platelets: 727 10*3/uL — ABNORMAL HIGH (ref 150–400)
RBC: 2.83 MIL/uL — ABNORMAL LOW (ref 4.22–5.81)
RDW: 15.6 % — ABNORMAL HIGH (ref 11.5–15.5)
WBC: 11.1 10*3/uL — ABNORMAL HIGH (ref 4.0–10.5)
nRBC: 0 % (ref 0.0–0.2)

## 2019-03-18 LAB — GLUCOSE, CAPILLARY
Glucose-Capillary: 136 mg/dL — ABNORMAL HIGH (ref 70–99)
Glucose-Capillary: 148 mg/dL — ABNORMAL HIGH (ref 70–99)
Glucose-Capillary: 151 mg/dL — ABNORMAL HIGH (ref 70–99)
Glucose-Capillary: 179 mg/dL — ABNORMAL HIGH (ref 70–99)
Glucose-Capillary: 161 mg/dL — ABNORMAL HIGH (ref 70–99)

## 2019-03-18 MED ORDER — CARVEDILOL 3.125 MG PO TABS: 3.1250 mg | ORAL_TABLET | Freq: Two times a day (BID) | ORAL | Status: DC

## 2019-03-18 MED ORDER — ROSUVASTATIN CALCIUM 40 MG PO TABS: 40.0000 mg | ORAL_TABLET | Freq: Every day | ORAL | Status: DC

## 2019-03-18 MED ORDER — INSULIN GLARGINE 100 UNIT/ML ~~LOC~~ SOLN: 19.0000 [IU] | Freq: Every day | SUBCUTANEOUS | 11 refills | Status: DC

## 2019-03-18 MED ORDER — GABAPENTIN 100 MG PO CAPS
200.0000 mg | ORAL_CAPSULE | Freq: Two times a day (BID) | ORAL | Status: DC
  Administered 2019-03-18 – 2019-03-19 (×2): 200 mg via ORAL
  Filled 2019-03-18 (×2): qty 2

## 2019-03-18 MED ORDER — GABAPENTIN 300 MG PO CAPS
400.0000 mg | ORAL_CAPSULE | Freq: Every day | ORAL | Status: DC
  Administered 2019-03-18: 400 mg via ORAL
  Filled 2019-03-18: qty 1

## 2019-03-18 MED ORDER — GABAPENTIN 100 MG PO CAPS: 200.0000 mg | ORAL_CAPSULE | Freq: Three times a day (TID) | ORAL | Status: DC

## 2019-03-18 MED ORDER — INSULIN ASPART 100 UNIT/ML ~~LOC~~ SOLN: 10.0000 [IU] | Freq: Three times a day (TID) | SUBCUTANEOUS | 11 refills | Status: DC

## 2019-03-18 MED ORDER — OXYCODONE-ACETAMINOPHEN 5-325 MG PO TABS: 1.0000 | ORAL_TABLET | Freq: Four times a day (QID) | ORAL | 0 refills | Status: DC | PRN

## 2019-03-18 MED ORDER — TICAGRELOR 90 MG PO TABS: 90.0000 mg | ORAL_TABLET | Freq: Two times a day (BID) | ORAL | Status: DC

## 2019-03-18 MED ORDER — ENSURE MAX PROTEIN PO LIQD: 11.0000 [oz_av] | Freq: Two times a day (BID) | ORAL | Status: DC

## 2019-03-18 MED ORDER — PANTOPRAZOLE SODIUM 40 MG PO TBEC: 40.0000 mg | DELAYED_RELEASE_TABLET | Freq: Every day | ORAL | 0 refills | Status: DC

## 2019-03-18 NOTE — Progress Notes (Signed)
Physical Therapy Treatment Patient Details Name: Richard [name] MRN: [id number] DOB: 01-12-1956 Today's Date: 03/18/2019    History of Present Illness 63 y.o. male with PMH: periph vascular disease, MI, a-fib, anemia, DM, cocaine use, s/p L BKA (5/22) due to gangrene and sepsis.     PT Comments    Pt in bed agreeable to participate, reports his pain has been difficult to control the last 2 days and less reponsive to pain meds than he would like- pt appears mildly distressed and anxious in reporting this. Pt requires minA to EOB, but then moves with modI for back to supine at EOS. At EOB< pt has good trunk control and balance, but involuntary Lt knee flexion from repeated left hamstrings spasms, which are significantly exacerbating to his resting pain level. Pt is able to perform LAQ at EOB and SLR while in supine, all with good control, but he clearly is in much more pain from the hamstrings spasms than any other activity or mobility performed in session. Discussed with RN, of note pt had flexaril at 9:30 and Na+/K+ this date are within normal range. Pt unable to tolerate transfers training this date, would ultimately be better able to participate with better pain control.     Follow Up Recommendations  SNF     Equipment Recommendations  None recommended by PT    Recommendations for Other Services       Precautions / Restrictions Precautions Precautions: Fall Restrictions RLE Weight Bearing: (No weight bearing restrictions seen in MD notes or nursing orders) LLE Weight Bearing: Non weight bearing    Mobility  Bed Mobility Overal bed mobility: Needs Assistance Bed Mobility: Supine to Sit;Sit to Supine     Supine to sit: Min assist Sit to supine: Modified independent (Device/Increase time)(good control of surgical limb and trunk control with BUE on bed)   General bed mobility comments: has some difficulty inititally coming up to EOB; good balance at EOB.   Transfers Overall  transfer level: (deferred; at EOB pt having repeated Left hamstrings spasms which dramatically worsen pain)                  Ambulation/Gait                 Stairs             Wheelchair Mobility    Modified Rankin (Stroke Patients Only)       Balance                                            Cognition Arousal/Alertness: Awake/alert Behavior During Therapy: Restless;Anxious;WFL for tasks assessed/performed Overall Cognitive Status: Within Functional Limits for tasks assessed                                        Exercises Amputee Exercises Knee Extension: AROM;Seated;Left;15 reps Straight Leg Raises: Supine;AROM;Left;5 reps    General Comments        Pertinent Vitals/Pain Pain Assessment: 0-10 Pain Score: 7  Pain Location: L leg at amputation site; worse with intermitten tLt hamstrings 'charlie horse'  Pain Descriptors / Indicators: Guarding;Grimacing;Spasm;Operative site guarding Pain Intervention(s): Limited activity within patient's tolerance;Premedicated before session;Repositioned    Home Living  Prior Function            PT Goals (current goals can now be found in the care plan section) Acute Rehab PT Goals Patient Stated Goal: Return to ambulation PT Goal Formulation: With patient Time For Goal Achievement: 03/30/19 Potential to Achieve Goals: Fair Progress towards PT goals: PT to reassess next treatment    Frequency    7X/week      PT Plan Current plan remains appropriate    Co-evaluation              AM-PAC PT "6 Clicks" Mobility   Outcome Measure  Help needed turning from your back to your side while in a flat bed without using bedrails?: A Little Help needed moving from lying on your back to sitting on the side of a flat bed without using bedrails?: A Little Help needed moving to and from a bed to a chair (including a wheelchair)?: A Lot Help  needed standing up from a chair using your arms (e.g., wheelchair or bedside chair)?: A Lot Help needed to walk in hospital room?: A Lot Help needed climbing 3-5 steps with a railing? : Total 6 Click Score: 13    End of Session Equipment Utilized During Treatment: Gait belt Activity Tolerance: Patient limited by pain;Other (comment)(hamstrings spasms) Patient left: with bed alarm set;with call bell/phone within reach;in bed Nurse Communication: Mobility status(HS spams are biggest limitor) PT Visit Diagnosis: Unsteadiness on feet (R26.81);Other abnormalities of gait and mobility (R26.89);Muscle weakness (generalized) (M62.81);Difficulty in walking, not elsewhere classified (R26.2)     Time: 1140-1200 PT Time Calculation (min) (ACUTE ONLY): 20 min  Charges:  $Therapeutic Exercise: 8-22 mins                     12:29 PM, 03/18/19 Etta Grandchild, PT, DPT Physical Therapist - [facility]  [phone] ([place])    [place] C 03/18/2019, 12:25 PM

## 2019-03-18 NOTE — TOC Progression Note (Signed)
Transition of Care [facility]) - Progression Note    Patient Details  Name: Richard [name] MRN: [id number] Date of Birth: August 25, 1956  Transition of Care [facility]) CM/SW [place], [place] Phone Number: 03/18/2019, 4:41 PM  Clinical Narrative:   Patient has not had a Covid test since 02/28/19 and in order to go to SNF for rehab, he would need to have a current Covid test. MD is aware. Covid test is still pending and patient will not be able to discharge today. CSW will continue to follow for discharge planning.       Barriers to Discharge: Continued Medical Work up  Expected Discharge Plan and Services   In-house Referral: Hospice / Palliative Care Discharge Planning Services: CM Consult   Living arrangements for the past 2 months: Single Family Home Expected Discharge Date: 03/18/19                                     Social Determinants of Health (SDOH) Interventions    Readmission Risk Interventions Readmission Risk Prevention Plan 03/02/2019  Transportation Screening Complete  HRI or Home Care Consult Complete  Palliative Care Screening Complete  Medication Review (RN Care Manager) Complete  Some recent data might be hidden

## 2019-03-18 NOTE — Progress Notes (Signed)
[facility] MD Progress Note  03/18/2019 10:04 AM Richard [name]  MRN:  [id number] Subjective: "I am having a good day" Principal Problem: Respiratory failure ([place]) Diagnosis: Principal Problem:   Respiratory failure ([place]) Active Problems:   Pressure injury of skin   Cocaine use disorder ([place])  Patient seen. Chart reviewed. Total Time spent with patient: 30 minutes  HPI: Richard [name] is a 63 y.o. 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 Psychiatry consult requested to address substance use and underlying mood disorder.  On evaluation, patient is calm and cooperative.  He is in bed and states his mood as good.  His affect is bright.  Patient denies any symptoms of depression, anxiety or panic attacks.  Patient denies any past self-harm.  Patient is currently denying any suicidal ideation, plan, or intent.  He denies HI.  He denies AVH.  Patient denies any symptoms of cravings for substances.  He does not desire resources for substance use treatment.  Patient does endorse pain related to his amputation, which he describes as a sharp knife in his skin, and requests assistance in neuropathic pain control.  He describes his pain management here in the hospital as, "so-so".  Patient vocalizes no other questions or concerns  Past Psychiatric History: he denies past psych diagnoses, past psych admissions, past suicidal attempts, taking any psych medications. Patient has a history of cocaine use disorder, stating that he previously used cocaine to manage his pain.  Patient is at risk for resuming cocaine after discharge.  Social Hx: patient reports he lives with significant other. Has children, Not working, Denies legal history. Denies access to firearms.  Past Medical History:  Past Medical History:  Diagnosis Date  . Atrial fibrillation ([place])    per patient  . Cancer  ([place])   . CHF (congestive heart failure) ([place])   . Chronic back pain   . Chronic leg pain   . Coronary artery disease   . Diabetes mellitus without complication ([place])   . Hypertension   . Neuropathy     Past Surgical History:  Procedure Laterality Date  . AMPUTATION Left 03/13/2019   Procedure: AMPUTATION BELOW KNEE;  Surgeon: Algernon Huxley, MD;  Location: [facility] ORS;  Service: Vascular;  Laterality: Left;  . CORONARY ANGIOPLASTY WITH STENT PLACEMENT    . EMBOLECTOMY Left 03/12/2019   Procedure: EMBOLECTOMY SFA POPLITEAL;  Surgeon: Algernon Huxley, MD;  Location: [facility] ORS;  Service: Vascular;  Laterality: Left;  . ENDARTERECTOMY FEMORAL Left 03/12/2019   Procedure: ENDARTERECTOMY FEMORAL;  Surgeon: Algernon Huxley, MD;  Location: [facility] ORS;  Service: Vascular;  Laterality: Left;  . ESOPHAGOGASTRODUODENOSCOPY N/A 09/11/2017   Procedure: ESOPHAGOGASTRODUODENOSCOPY (EGD);  Surgeon: Virgel Manifold, MD;  Location: [facility] ENDOSCOPY;  Service: Endoscopy;  Laterality: N/A;  . LOWER EXTREMITY ANGIOGRAPHY Left 11/17/2018   Procedure: Lower Extremity Angiography, with possible intervention;  Surgeon: Algernon Huxley, MD;  Location: [place] CV LAB;  Service: Cardiovascular;  Laterality: Left;  . LOWER EXTREMITY ANGIOGRAPHY Left 03/04/2019   Procedure: Lower Extremity Angiography;  Surgeon: Algernon Huxley, MD;  Location: [place] CV LAB;  Service: Cardiovascular;  Laterality: Left;  . pancreas removed     Family History:  Family History  Problem Relation Age of Onset  . CAD Mother   . Diabetes Mellitus II Mother   . Diabetes Mellitus II  Father    Family Psychiatric  History:  denies any psych history and suicides in family members.  Social History:  Social History   Substance and Sexual Activity  Alcohol Use No  . Alcohol/week: 0.0 standard drinks     Social History   Substance and Sexual Activity  Drug Use Yes  . Types: Cocaine   Comment: Has used in the past-heroin. Cocaine used over a  year ago per pt.    Social History   Socioeconomic History  . Marital status: Single    Spouse name: Not on file  . Number of children: Not on file  . Years of education: Not on file  . Highest education level: Not on file  Occupational History  . Not on file  Social Needs  . Financial resource strain: Not on file  . Food insecurity:    Worry: Not on file    Inability: Not on file  . Transportation needs:    Medical: Not on file    Non-medical: Not on file  Tobacco Use  . Smoking status: Current Every Day Smoker    Packs/day: 0.50  . Smokeless tobacco: Never Used  Substance and Sexual Activity  . Alcohol use: No    Alcohol/week: 0.0 standard drinks  . 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 Cocaine used over a year ago per pt.  . Sexual activity: Not on file  Lifestyle  . Physical activity:    Days per week: Not on file    Minutes per session: Not on file  . Stress: Not on file  Relationships  . Social connections:    Talks on phone: Not on file    Gets together: Not on file    Attends religious service: Not on file    Active member of club or organization: Not on file    Attends meetings of clubs or organizations: Not on file    Relationship status: Not on file  Other Topics Concern  . Not on file  Social History Narrative  . Not on file   Additional Social History:       Lives with fiance'                  Sleep: Fair  Appetite:  Good  Current Medications: Current Facility-Administered Medications  Medication Dose Route Frequency Provider Last Rate Last Dose  . 0.9 %  sodium chloride infusion   Intravenous Continuous Algernon Huxley, MD   Stopped at 03/18/19 [phone]  . acetaminophen (TYLENOL) tablet 650 mg  650 mg Oral Q6H Griffin, Crystal, NP   650 mg at 03/18/19 7341  . apixaban (ELIQUIS) tablet 5 mg  5 mg Oral BID Bettey Costa, MD   5 mg at 03/18/19 0930  . aspirin chewable tablet 81 mg  81 mg Oral Daily Algernon Huxley, MD   81 mg at 03/18/19 0930  . carvedilol (COREG) tablet 3.125 mg  3.125 mg Oral BID WC Bettey Costa, MD   3.125 mg at 03/18/19 0810  . cyclobenzaprine (FLEXERIL) tablet 10 mg  10 mg Oral TID Asencion Gowda, NP   10 mg at 03/18/19 0930  . gabapentin (NEURONTIN) capsule 100 mg  100 mg Oral TID Asencion Gowda, NP   100 mg at 03/18/19 0930  . HYDROmorphone (DILAUDID) injection 1 mg  1 mg Intravenous Q4H PRN Asencion Gowda, NP   1 mg at 03/17/19 1404  . insulin aspart (  novoLOG) injection 0-15 Units  0-15 Units Subcutaneous TID WC Algernon Huxley, MD   3 Units at 03/18/19 [phone]  . insulin aspart (novoLOG) injection 0-5 Units  0-5 Units Subcutaneous QHS Algernon Huxley, MD      . insulin aspart (novoLOG) injection 10 Units  10 Units Subcutaneous TID WC Algernon Huxley, MD   10 Units at 03/16/19 0809  . insulin glargine (LANTUS) injection 19 Units  19 Units Subcutaneous Daily Algernon Huxley, MD   19 Units at 03/18/19 0930  . MEDLINE mouth rinse  15 mL Mouth Rinse BID Algernon Huxley, MD   15 mL at 03/18/19 0931  . naloxone [facility]) injection 0.4 mg  0.4 mg Intravenous PRN Algernon Huxley, MD      . ondansetron [facility]) injection 4 mg  4 mg Intravenous Q6H PRN Algernon Huxley, MD   4 mg at 03/12/19 3785  . oxyCODONE-acetaminophen (PERCOCET/ROXICET) 5-325 MG per tablet 1 tablet  1 tablet Oral Q6H PRN Asencion Gowda, NP   1 tablet at 03/18/19 0836  . protein supplement (ENSURE MAX) liquid  11 oz Oral BID BM Algernon Huxley, MD   11 oz at 03/18/19 0929  . rosuvastatin (CRESTOR) tablet 40 mg  40 mg Oral q1800 Algernon Huxley, MD   40 mg at 03/17/19 1749  . sodium chloride flush (NS) 0.9 % injection 10 mL  10 mL Intravenous Q12H Algernon Huxley, MD   10 mL at 03/17/19 2220  . sodium chloride flush (NS) 0.9 % injection 10 mL  10 mL Intravenous PRN Algernon Huxley, MD   10 mL at 03/06/19 1816  . ticagrelor (BRILINTA) tablet 90 mg  90 mg Oral BID [name], MD   90 mg at 03/18/19 0930    Lab Results:  Results for orders placed or  performed during the hospital encounter of 02/28/19 (from the past 48 hour(s))  Glucose, capillary     Status: None   Collection Time: 03/16/19 11:36 AM  Result Value Ref Range   Glucose-Capillary 91 70 - 99 mg/dL  Glucose, capillary     Status: Abnormal   Collection Time: 03/16/19  5:05 PM  Result Value Ref Range   Glucose-Capillary 120 (H) 70 - 99 mg/dL  Glucose, capillary     Status: Abnormal   Collection Time: 03/16/19  8:58 PM  Result Value Ref Range   Glucose-Capillary 165 (H) 70 - 99 mg/dL  Glucose, capillary     Status: Abnormal   Collection Time: 03/17/19  3:06 AM  Result Value Ref Range   Glucose-Capillary 223 (H) 70 - 99 mg/dL  CBC     Status: Abnormal   Collection Time: 03/17/19  5:39 AM  Result Value Ref Range   WBC 13.7 (H) 4.0 - 10.5 K/uL   RBC 2.76 (L) 4.22 - 5.81 MIL/uL   Hemoglobin 7.8 (L) 13.0 - 17.0 g/dL   HCT 24.1 (L) 39.0 - 52.0 %   MCV 87.3 80.0 - 100.0 fL   MCH 28.3 26.0 - 34.0 pg   MCHC 32.4 30.0 - 36.0 g/dL   RDW 15.5 11.5 - 15.5 %   Platelets 753 (H) 150 - 400 K/uL   nRBC 0.1 0.0 - 0.2 %    Comment: Performed at [facility], [address] [place], [place] [address]  Glucose, capillary     Status: Abnormal   Collection Time: 03/17/19  7:52 AM  Result Value Ref Range   Glucose-Capillary 207 (  H) 70 - 99 mg/dL   Comment 1 Notify RN   Glucose, capillary     Status: Abnormal   Collection Time: 03/17/19 11:57 AM  Result Value Ref Range   Glucose-Capillary 187 (H) 70 - 99 mg/dL   Comment 1 Notify RN   Glucose, capillary     Status: None   Collection Time: 03/17/19  5:02 PM  Result Value Ref Range   Glucose-Capillary 96 70 - 99 mg/dL  Glucose, capillary     Status: Abnormal   Collection Time: 03/17/19  9:22 PM  Result Value Ref Range   Glucose-Capillary 171 (H) 70 - 99 mg/dL   Comment 1 Notify RN   Glucose, capillary     Status: Abnormal   Collection Time: 03/18/19  2:20 AM  Result Value Ref Range   Glucose-Capillary 148 (H) 70 - 99  mg/dL  CBC     Status: Abnormal   Collection Time: 03/18/19  4:26 AM  Result Value Ref Range   WBC 11.1 (H) 4.0 - 10.5 K/uL   RBC 2.83 (L) 4.22 - 5.81 MIL/uL   Hemoglobin 7.9 (L) 13.0 - 17.0 g/dL   HCT 25.2 (L) 39.0 - 52.0 %   MCV 89.0 80.0 - 100.0 fL   MCH 27.9 26.0 - 34.0 pg   MCHC 31.3 30.0 - 36.0 g/dL   RDW 15.6 (H) 11.5 - 15.5 %   Platelets 727 (H) 150 - 400 K/uL   nRBC 0.0 0.0 - 0.2 %    Comment: Performed at [facility], [address] [place], [place] [address]  Glucose, capillary     Status: Abnormal   Collection Time: 03/18/19  7:56 AM  Result Value Ref Range   Glucose-Capillary 151 (H) 70 - 99 mg/dL   Comment 1 Notify RN     Blood Alcohol level:  Lab Results  Component Value Date   ETH <10 02/28/2019   ETH <10 82/42/3536    Metabolic Disorder Labs: Lab Results  Component Value Date   HGBA1C >15.5 (H) 02/28/2019   MPG >398 02/28/2019   MPG 378.06 11/11/2018   No results found for: PROLACTIN Lab Results  Component Value Date   CHOL 186 08/10/2015   TRIG 144 08/10/2015   HDL 25 (L) 08/10/2015   CHOLHDL 7.4 08/10/2015   VLDL 29 08/10/2015   LDLCALC 132 (H) 08/10/2015    Physical Findings: AIMS:  , ,  ,  ,    CIWA:    COWS:     Musculoskeletal: Strength & Muscle Tone: within normal limits Gait & Station: left BKA with dressing in place. Patient leans: N/A  Psychiatric Specialty Exam: Physical Exam  Constitutional: He is oriented to person, place, and time. He appears well-developed and well-nourished. No distress.  HENT:  Head: Normocephalic and atraumatic.  Eyes: EOM are normal.  Neck: Normal range of motion.  Cardiovascular: Normal rate.  Respiratory: Effort normal. No respiratory distress.  Musculoskeletal: Normal range of motion.     Comments: Left BKA with dressing dry and intact  Neurological: He is alert and oriented to person, place, and time.    Review of Systems  Constitutional: Negative.   Respiratory: Negative.    Cardiovascular: Negative.   Musculoskeletal:       Post-operative pain  Neurological: Negative.   Psychiatric/Behavioral: Positive for substance abuse (cocaine). Negative for depression, hallucinations, memory loss and suicidal ideas. The patient is not nervous/anxious and does not have insomnia.     Blood pressure (!) 144/79,  pulse 92, temperature 98.7 F (37.1 C), resp. rate 16, height 5\' 5"  (1.651 m), weight 57.3 kg, SpO2 99 %.Body mass index is 21.02 kg/m.  General Appearance: Neat  Eye Contact:  Good  Speech:  Clear and Coherent and Normal Rate  Volume:  Normal  Mood:  Euthymic  Affect:  Appropriate and Congruent  Thought Process:  Goal Directed and Linear  Orientation:  Full (Time, Place, and Person)  Thought Content:  Logical and Hallucinations: None  Suicidal Thoughts:  No  Homicidal Thoughts:  No  Memory:  good  Judgement:  Fair  Insight:  Shallow  Psychomotor Activity:  Normal  Concentration:  Concentration: Fair  Recall:  Good  Fund of Knowledge:  Fair  Language:  Good  Akathisia:  No  Handed:  Right  AIMS (if indicated):     Assets:  Communication Skills Housing Intimacy Social Support  ADL's:  Impaired  Cognition:  WNL  Sleep:   adequate     Treatment Plan Summary: Daily contact with patient to assess and evaluate symptoms and progress in treatment  Will change gabapentin dosing to 200 mg morning and afternoon with 400 mg at night to assist in neuropathic phantom limb syndrome as well as help decrease cocaine cravings, noting patient uses cocaine to "treat pain."  Disposition: No evidence of imminent risk to self or others at present.   Patient does not meet criteria for psychiatric inpatient admission. Supportive therapy provided about ongoing stressors.   Assessment: Richard [name] is a 63 y.o. male patient with a med h/o periph vascular disease, MI, a-fib, anemia, DM, cocaine use disorder, admitted to medical service due to s/p L BKA due to L leg  gangrene and sepsis. Psychiatry consult requested to address substance use and underlying mood disorder.  Impression: Cocaine use disorder, moderate.  Patient denies feeling depressed, anxious, suicidal, homicidal, he does not appear psychotic, gravely-disabled or at risk to harm self/others, so he does not meet criteria for inpatient psych admission. Regarding substance use, patient is in precontemplative stage and does not see his cocaine use as a problem, possibly minimizes his substance use. He refuses any chem-dependency referrals, resources and is not interested in treatment at this time. Patient notified that resources can be provided if he changes his mind. Patient expressed his agreement and understanding to the same.  Lavella Hammock, MD 03/18/2019, 10:04 AM

## 2019-03-18 NOTE — Progress Notes (Addendum)
Daily Progress Note   Patient Name: Richard [name]       Date: 03/18/2019 DOB: 03/09/1956  Age: 63 y.o. MRN#: [id number] Attending Physician: Bettey Costa, MD Primary Care Physician: [facility] Date: 02/28/2019  Reason for Consultation/Follow-up: Establishing goals of care  Subjective: Patient is resting in bed this morning with nursing repositioning him. Upon checking on him yesterday evening, patient was sleeping, and per nursing, the medications were keeping him comfortable.  Today, he states the Percocet 5/325mg  is working well for him as long as he takes it when he needs it and does not wait too long. He states he still has burning and tingling pain present. Neurontin increased to 200mg  TID. Discussed keeping his stump elevated while sitting or lying in bed. He states he would like to go to a rehab facility to become more independent with ADL's and moving.   Dilaudid 1mg  q4 PRN for breakthrough pain. Percocet 5mg  1 q 6h PRN for pain.  Neurontin 200mg  TID for neuropathic pain.  Flexeril 10mg  TID for muscle spasms. Tylenol 650mg  q6h scheduled for pain.  Please provide PRN medications as needed to help aleve peaks and troughs. Will need to wean as able. Hopeful multimodal therapy will help with pain relief.   For weaning, could provide tapers such as:   Percocet 5/325mg  q6 hours for 7 days  Percocet 5/325mg  q 8 hours for 5 days  Percocet 5/325mg  q 12 hours for 5 days  Percocet 5/325 mg 1 tab daily for 4 days.   Neurontin 200mg  TID for 2 weeks  Neurontin 100mg  TID for 2 weeks  Neurontin 100mg  BID for 1 week Neurontin 100mg  daily for 1 week    Length of Stay: 18  Current Medications: Scheduled Meds:  . acetaminophen  650 mg Oral Q6H  . apixaban  5 mg  Oral BID  . aspirin  81 mg Oral Daily  . carvedilol  3.125 mg Oral BID WC  . cyclobenzaprine  10 mg Oral TID  . gabapentin  200 mg Oral TID  . insulin aspart  0-15 Units Subcutaneous TID WC  . insulin aspart  0-5 Units Subcutaneous QHS  . insulin aspart  10 Units Subcutaneous TID WC  . insulin glargine  19 Units Subcutaneous Daily  . mouth rinse  15 mL Mouth  Rinse BID  . Ensure Max Protein  11 oz Oral BID BM  . rosuvastatin  40 mg Oral q1800  . sodium chloride flush  10 mL Intravenous Q12H  . ticagrelor  90 mg Oral BID    Continuous Infusions: . sodium chloride Stopped (03/18/19 0836)    PRN Meds: HYDROmorphone (DILAUDID) injection, naLOXone (NARCAN)  injection, ondansetron (ZOFRAN) IV, oxyCODONE-acetaminophen, sodium chloride flush  Physical Exam Pulmonary:     Effort: Pulmonary effort is normal.  Skin:    General: Skin is warm and dry.  Neurological:     Mental Status: He is alert.     Comments: Oriented             Vital Signs: BP (!) 144/79   Pulse 92   Temp 98.7 F (37.1 C)   Resp 16   Ht 5\' 5"  (1.651 m)   Wt 57.3 kg   SpO2 99%   BMI 21.02 kg/m  SpO2: SpO2: 99 % O2 Device: O2 Device: Room Air O2 Flow Rate: O2 Flow Rate (L/min): 6 L/min  Intake/output summary:   Intake/Output Summary (Last 24 hours) at 03/18/2019 1037 Last data filed at 03/18/2019 0900 Gross per 24 hour  Intake 360 ml  Output 1650 ml  Net -1290 ml   LBM: Last BM Date: 03/17/19 Baseline Weight: Weight: 54 kg Most recent weight: Weight: 57.3 kg       Palliative Assessment/Data:    Flowsheet Rows     Most Recent Value  Intake Tab  Referral Department  Critical care  Unit at Time of Referral  ICU  Palliative Care Primary Diagnosis  Cancer  Date Notified  02/28/19  Palliative Care Type  New Palliative care  Reason for referral  Clarify Goals of Care  Date of Admission  02/28/19  Date first seen by Palliative Care  03/02/19  # of days Palliative referral response time  2 Day(s)   # of days IP prior to Palliative referral  0  Clinical Assessment  Psychosocial & Spiritual Assessment  Palliative Care Outcomes      Patient Active Problem List   Diagnosis Date Noted  . Cocaine use disorder ([place])   . Pressure injury of skin 03/12/2019  . Respiratory failure ([place]) 02/28/2019  . Diabetes with hyperosmolar coma ([place]) 11/11/2018  . A-fib ([place]) 09/20/2018  . Hyperglycemia 08/03/2018  . Protein-calorie malnutrition, severe 01/10/2018  . Sepsis ([place]) 01/09/2018  . DKA, type 2 ([place]) 09/05/2017  . Ileus ([place]) 08/24/2017  . Diabetes ([place]) 08/24/2017  . Atrial flutter ([place]) 08/24/2017  . CAD (coronary artery disease) 08/24/2017  . HTN (hypertension) 08/24/2017  . Atrial fibrillation ([place]) 08/24/2017  . Tobacco use disorder, severe, dependence 07/25/2017  . Wound dehiscence, surgical, initial encounter 06/24/2017  . Acute hematogenous osteomyelitis of right foot ([place]) 06/20/2017  . Adenocarcinoma of pancreas ([place]) 06/20/2017  . Esophagitis 06/20/2017  . Cellulitis of right lower extremity 06/10/2017  . Hypertension, poor control 06/10/2017  . Insulin dependent type 2 diabetes mellitus ([place]) 06/10/2017  . Chest pain 08/10/2015    Palliative Care Assessment & Plan    Recommendations/Plan:  Pain medications adjusted for pain relief. Please see above.  Appreciate psych consult.     Code Status:    Code Status Orders  (From admission, onward)         Start     Ordered   03/02/19 1454  Full code  Continuous     03/02/19 1454        Code  Status History    Date Active Date Inactive Code Status Order ID Comments User Context   02/28/2019 1449 03/01/2019 0836 Full Code [id number]  Gorden Harms, MD Inpatient   02/28/2019 1449 02/28/2019 1449 Full Code [id number]  Gorden Harms, MD Inpatient   11/11/2018 0758 11/18/2018 1814 DNR [id number]  Loletha Grayer, MD ED   09/20/2018 1635 09/24/2018 1854 Full Code [id number]  Fritzi Mandes, MD ED   08/03/2018 1020  08/04/2018 1813 Full Code [id number]  Salary, Avel Peace, MD ED   01/09/2018 1025 01/11/2018 1932 Full Code [id number]  Harrie Foreman, MD Inpatient   09/05/2017 1559 09/11/2017 1928 Full Code [id number]  Fritzi Mandes, MD Inpatient   08/24/2017 0238 08/29/2017 2131 Full Code [id number]  Lance Coon, MD Inpatient   08/10/2015 0822 08/11/2015 1947 Full Code [id number]  Harrie Foreman, MD Inpatient       Prognosis:   Unable to determine  Discharge Planning:  To Be Determined   Thank you for allowing the Palliative Medicine Team to assist in the care of this patient.   Total Time 35 min Prolonged Time Billed  no       Greater than 50%  of this time was spent counseling and coordinating care related to the above assessment and plan.  Asencion Gowda, NP  Please contact Palliative Medicine Team phone at [phone] for questions and concerns.

## 2019-03-18 NOTE — Discharge Summary (Addendum)
[facility] NAME: Richard [name]    MR#:  [id number]  DATE OF BIRTH:  01-Oct-1956  DATE OF ADMISSION:  02/28/2019 ADMITTING PHYSICIAN: Gorden Harms, MD  DATE OF DISCHARGE: 03/19/2019  PRIMARY CARE PHYSICIAN: [facility] [place]    ADMISSION DIAGNOSIS:  Acute respiratory failure, unspecified whether with hypoxia or hypercapnia ([place]) [J96.00] Diabetic ketoacidosis with coma associated with type 2 diabetes mellitus ([place]) [E11.11]  DISCHARGE DIAGNOSIS:  Principal Problem:   Respiratory failure ([place]) Active Problems:   Pressure injury of skin   Cocaine use disorder ([place])   SECONDARY DIAGNOSIS:   Past Medical History:  Diagnosis Date  . Atrial fibrillation ([place])    per patient  . Cancer ([place])   . CHF (congestive heart failure) ([place])   . Chronic back pain   . Chronic leg pain   . Coronary artery disease   . Diabetes mellitus without complication ([place])   . Hypertension   . Neuropathy     HOSPITAL COURSE:   63 year old male with PAF on Eliquis, PVD and diabetes who presented to the emergency room due to unresponsiveness and acute hypoxic respiratory failure.  1.  Sepsis: Patient presented with leukocytosis and hypotension.  Sepsis due to Proteus vulgaris bacteremia due to gangrenous toes.  Patient was evaluated by infectious disease. Patient wastreated with IV antibiotics with Zosyn and has completed treatment.   2.  Severe peripheral vascular disease status post left femoral endarterectomy and left BKA POD #5 OR dressings removed by vascular surgery.  Surgical sites healing well. Patient will need outpatient follow-up in 2 weeks to start staple removal.  3.  Acute hypoxic respiratory failure on admission status post intubation/extubation: No acute events.  4.  Status post NSTEMI with inferoposterior myocardial infarction: Conservative management as per cardiology. Continue Brilinta and statin. He  is too high risk for bleeding with Eliquis, Brillanta and ASA. Cardiology follow up 1 week after discharge.   5.  Cocaine use disorder: Patient wasevaluated by psychiatry. Patient will try to minimize substance abuse  6.  PAF: Continue Eliquisand Coreg for heart rate control 7.  Diabetes: Continue ADA diet, Lantus and Novolog  8.  Acute kidney injury which improved with IV fluids  9.  History of pancreatic cancer 10.  Anemia of chronic disease: Patient status post 4 unit PRBC.    COVID testing negative may go to SNF  y  DISCHARGE CONDITIONS AND DIET:   Stable Diabetic diet  CONSULTS OBTAINED:  Treatment Team:  Dionisio David, MD Algernon Huxley, MD Samara Deist, DPM  DRUG ALLERGIES:  No Known Allergies  DISCHARGE MEDICATIONS:   Allergies as of 03/18/2019   No Known Allergies     Medication List    STOP taking these medications   amitriptyline 50 MG tablet Commonly known as:  ELAVIL   aspirin 81 MG chewable tablet   atorvastatin 40 MG tablet Commonly known as:  LIPITOR   HYDROcodone-acetaminophen 5-325 MG tablet Commonly known as:  NORCO/VICODIN   Levemir FlexTouch 100 UNIT/ML Pen Generic drug:  Insulin Detemir   NovoLOG FlexPen 100 UNIT/ML FlexPen Generic drug:  insulin aspart Replaced by:  insulin aspart 100 UNIT/ML injection You also have another medication with the same name that you need to continue taking as instructed.   traZODone 50 MG tablet Commonly known as:  DESYREL     TAKE these medications   Accu-Chek Aviva Plus test strip Generic drug:  glucose blood  U ONCE TO BID UTD   Accu-Chek Aviva Plus w/Device Kit U TO TEST ONCE TO BID   Accu-Chek Softclix Lancets lancets TEST 1-2 XD   acetaminophen 325 MG tablet Commonly known as:  TYLENOL Take 1 tablet (325 mg total) by mouth every 6 (six) hours as needed for mild pain (or Fever >/= 101).   apixaban 5 MG Tabs tablet Commonly known as:  ELIQUIS Take 1 tablet (5 mg total) by  mouth 2 (two) times daily.   carvedilol 3.125 MG tablet Commonly known as:  COREG Take 1 tablet (3.125 mg total) by mouth 2 (two) times daily with a meal.   Ensure Max Protein Liqd Take 330 mLs (11 oz total) by mouth 2 (two) times daily between meals.   gabapentin 100 MG capsule Commonly known as:  NEURONTIN Take 1 capsule (100 mg total) by mouth 3 (three) times daily.   insulin aspart 100 UNIT/ML injection Commonly known as:  novoLOG Inject 0-5 Units into the skin at bedtime. CBG < 70: implement hypoglycemia protocol CBG 70 - 120: 0 units CBG 121 - 150: 0 units CBG 151 - 200: 0 units CBG 201 - 250: 2 units CBG 251 - 300: 3 units CBG 301 - 350: 4 units CBG 351 - 400: 5 units CBG > 400: call MD What changed:    Another medication with the same name was added. Make sure you understand how and when to take each.  Another medication with the same name was removed. Continue taking this medication, and follow the directions you see here.   insulin aspart 100 UNIT/ML injection Commonly known as:  novoLOG Inject 10 Units into the skin 3 (three) times daily with meals. What changed:  You were already taking a medication with the same name, and this prescription was added. Make sure you understand how and when to take each. Replaces:  NovoLOG FlexPen 100 UNIT/ML FlexPen   insulin glargine 100 UNIT/ML injection Commonly known as:  LANTUS Inject 0.19 mLs (19 Units total) into the skin daily.   multivitamin with minerals Tabs tablet Take 1 tablet by mouth daily.   Needles & Syringes Misc Use as directed   oxyCODONE-acetaminophen 5-325 MG tablet Commonly known as:  PERCOCET/ROXICET Take 1 tablet by mouth every 6 (six) hours as needed for moderate pain or severe pain.   pantoprazole 40 MG tablet Commonly known as:  Protonix Take 1 tablet (40 mg total) by mouth daily.   rosuvastatin 40 MG tablet Commonly known as:  CRESTOR Take 1 tablet (40 mg total) by mouth daily at 6 PM.    ticagrelor 90 MG Tabs tablet Commonly known as:  BRILINTA Take 1 tablet (90 mg total) by mouth 2 (two) times daily.            Discharge Care Instructions  (From admission, onward)         Start     Ordered   03/18/19 0000  Discharge wound care:    Comments:  Change dressing/wrap if soiled PRN   03/18/19 0838            Today   CHIEF COMPLAINT:  Patient doing well waiting to leave hospital   VITAL SIGNS:  Blood pressure (!) 144/79, pulse 92, temperature 98.7 F (37.1 C), resp. rate 16, height _0  (1.651 m), weight 57.3 kg, SpO2 99 %.   REVIEW OF SYSTEMS:  ROS   PHYSICAL EXAMINATION:  GENERAL:  63 y.o.-year-old patient lying in the bed with no acute  distress.  NECK:  Supple, no jugular venous distention. No thyroid enlargement, no tenderness.  LUNGS: Normal breath sounds bilaterally, no wheezing, rales,rhonchi  No use of accessory muscles of respiration.  CARDIOVASCULAR: S1, S2 normal. No murmurs, rubs, or gallops.  ABDOMEN: Soft, non-tender, non-distended. Bowel sounds present. No organomegaly or mass.  EXTREMITIES: No pedal edema, cyanosis, or clubbing.  Left BKA  PSYCHIATRIC: The patient is alert and oriented x 3.   DATA REVIEW:   CBC Recent Labs  Lab 03/18/19 0426  WBC 11.1*  HGB 7.9*  HCT 25.2*  PLT 727*    Chemistries  Recent Labs  Lab 03/13/19 0512  03/16/19 0545  NA 135   < > 136  K 4.2   < > 4.1  CL 100   < > 102  CO2 27   < > 25  GLUCOSE 362*   < > 151*  BUN 25*   < > 11  CREATININE 0.76   < > 0.69  CALCIUM 7.8*   < > 8.0*  MG 2.3  --   --    < > = values in this interval not displayed.    Cardiac Enzymes No results for input(s): TROPONINI in the last 168 hours.  Microbiology Results  _0 @  RADIOLOGY:  No results found.    Allergies as of 03/18/2019   No Known Allergies     Medication List    STOP taking these medications   amitriptyline 50 MG tablet Commonly known as:  ELAVIL   aspirin 81 MG  chewable tablet   atorvastatin 40 MG tablet Commonly known as:  LIPITOR   HYDROcodone-acetaminophen 5-325 MG tablet Commonly known as:  NORCO/VICODIN   Levemir FlexTouch 100 UNIT/ML Pen Generic drug:  Insulin Detemir   NovoLOG FlexPen 100 UNIT/ML FlexPen Generic drug:  insulin aspart Replaced by:  insulin aspart 100 UNIT/ML injection You also have another medication with the same name that you need to continue taking as instructed.   traZODone 50 MG tablet Commonly known as:  DESYREL     TAKE these medications   Accu-Chek Aviva Plus test strip Generic drug:  glucose blood U ONCE TO BID UTD   Accu-Chek Aviva Plus w/Device Kit U TO TEST ONCE TO BID   Accu-Chek Softclix Lancets lancets TEST 1-2 XD   acetaminophen 325 MG tablet Commonly known as:  TYLENOL Take 1 tablet (325 mg total) by mouth every 6 (six) hours as needed for mild pain (or Fever >/= 101).   apixaban 5 MG Tabs tablet Commonly known as:  ELIQUIS Take 1 tablet (5 mg total) by mouth 2 (two) times daily.   carvedilol 3.125 MG tablet Commonly known as:  COREG Take 1 tablet (3.125 mg total) by mouth 2 (two) times daily with a meal.   Ensure Max Protein Liqd Take 330 mLs (11 oz total) by mouth 2 (two) times daily between meals.   gabapentin 100 MG capsule Commonly known as:  NEURONTIN Take 1 capsule (100 mg total) by mouth 3 (three) times daily.   insulin aspart 100 UNIT/ML injection Commonly known as:  novoLOG Inject 0-5 Units into the skin at bedtime. CBG < 70: implement hypoglycemia protocol CBG 70 - 120: 0 units CBG 121 - 150: 0 units CBG 151 - 200: 0 units CBG 201 - 250: 2 units CBG 251 - 300: 3 units CBG 301 - 350: 4 units CBG 351 - 400: 5 units CBG > 400: call MD What changed:    Another medication  with the same name was added. Make sure you understand how and when to take each.  Another medication with the same name was removed. Continue taking this medication, and follow the directions  you see here.   insulin aspart 100 UNIT/ML injection Commonly known as:  novoLOG Inject 10 Units into the skin 3 (three) times daily with meals. What changed:  You were already taking a medication with the same name, and this prescription was added. Make sure you understand how and when to take each. Replaces:  NovoLOG FlexPen 100 UNIT/ML FlexPen   insulin glargine 100 UNIT/ML injection Commonly known as:  LANTUS Inject 0.19 mLs (19 Units total) into the skin daily.   multivitamin with minerals Tabs tablet Take 1 tablet by mouth daily.   Needles & Syringes Misc Use as directed   oxyCODONE-acetaminophen 5-325 MG tablet Commonly known as:  PERCOCET/ROXICET Take 1 tablet by mouth every 6 (six) hours as needed for moderate pain or severe pain.   pantoprazole 40 MG tablet Commonly known as:  Protonix Take 1 tablet (40 mg total) by mouth daily.   rosuvastatin 40 MG tablet Commonly known as:  CRESTOR Take 1 tablet (40 mg total) by mouth daily at 6 PM.   ticagrelor 90 MG Tabs tablet Commonly known as:  BRILINTA Take 1 tablet (90 mg total) by mouth 2 (two) times daily.            Discharge Care Instructions  (From admission, onward)         Start     Ordered   03/18/19 0000  Discharge wound care:    Comments:  Change dressing/wrap if soiled PRN   03/18/19 6812            Management plans discussed with the patient and he is in agreement. Stable for discharge   Patient should follow up with vascular and cardiology  CODE STATUS:     Code Status Orders  (From admission, onward)         Start     Ordered   03/06/19 1317  Do not attempt resuscitation (DNR)  Continuous    Question Answer Comment  In the event of cardiac or respiratory ARREST Do not call a "code blue"   In the event of cardiac or respiratory ARREST Do not perform Intubation, CPR, defibrillation or ACLS   In the event of cardiac or respiratory ARREST Use medication by any route, position, wound  care, and other measures to relive pain and suffering. May use oxygen, suction and manual treatment of airway obstruction as needed for comfort.   Comments MOST form in chart.      03/06/19 1316        Code Status History    Date Active Date Inactive Code Status Order ID Comments User Context   03/02/2019 1454 03/06/2019 1316 Full Code [id number]  Wilhelmina Mcardle, MD Inpatient   02/28/2019 1449 03/01/2019 0836 Full Code [id number]  Gorden Harms, MD Inpatient   02/28/2019 1449 02/28/2019 1449 Full Code [id number]  Gorden Harms, MD Inpatient   11/11/2018 0758 11/18/2018 1814 DNR [id number]  Loletha Grayer, MD ED   09/20/2018 1635 09/24/2018 1854 Full Code [id number]  Fritzi Mandes, MD ED   08/03/2018 1020 08/04/2018 1813 Full Code [id number]  Salary, Avel Peace, MD ED   01/09/2018 1025 01/11/2018 1932 Full Code [id number]  Harrie Foreman, MD Inpatient   09/05/2017 1559 09/11/2017 1928 Full Code [id number]  Fritzi Mandes, MD Inpatient  08/24/2017 0238 08/29/2017 2131 Full Code [id number]  Lance Coon, MD Inpatient   08/10/2015 0822 08/11/2015 1947 Full Code [id number]  Harrie Foreman, MD Inpatient      TOTAL TIME TAKING CARE OF THIS PATIENT: 39 minutes.    Note: This dictation was prepared with Dragon dictation along with smaller phrase technology. Any transcriptional errors that result from this process are unintentional.  Bettey Costa M.D on 03/18/2019 at 8:43 AM  Between 7am to 6pm - Pager - [phone] After 6pm go to [email or web address] - password [email or web address] [facility]  Office  [phone]  CC: Primary care physician; [facility] [place]

## 2019-03-19 DIAGNOSIS — M25562 Pain in left knee: Secondary | ICD-10-CM

## 2019-03-19 LAB — SURGICAL PATHOLOGY

## 2019-03-19 LAB — NOVEL CORONAVIRUS, NAA (HOSP ORDER, SEND-OUT TO REF LAB; TAT 18-24 HRS): SARS-CoV-2, NAA: NOT DETECTED

## 2019-03-19 LAB — GLUCOSE, CAPILLARY
Glucose-Capillary: 174 mg/dL — ABNORMAL HIGH (ref 70–99)
Glucose-Capillary: 190 mg/dL — ABNORMAL HIGH (ref 70–99)
Glucose-Capillary: 106 mg/dL — ABNORMAL HIGH (ref 70–99)

## 2019-03-19 MED ORDER — GABAPENTIN 300 MG PO CAPS: 600.0000 mg | ORAL_CAPSULE | Freq: Every day | ORAL | Status: DC

## 2019-03-19 MED ORDER — OXYCODONE-ACETAMINOPHEN 5-325 MG PO TABS
1.0000 | ORAL_TABLET | Freq: Four times a day (QID) | ORAL | Status: DC | PRN
  Administered 2019-03-19: 08:00:00 1 via ORAL
  Administered 2019-03-19: 15:00:00 2 via ORAL
  Filled 2019-03-19: qty 2

## 2019-03-19 MED ORDER — ACETAMINOPHEN 325 MG PO TABS: 650.0000 mg | ORAL_TABLET | Freq: Four times a day (QID) | ORAL | Status: DC | PRN

## 2019-03-19 MED ORDER — CYCLOBENZAPRINE HCL 10 MG PO TABS: 10.0000 mg | ORAL_TABLET | Freq: Three times a day (TID) | ORAL | Status: DC | PRN

## 2019-03-19 NOTE — Progress Notes (Signed)
Ch visited w/ pt to see how well he was progressing post-op. Pt had a positive affect and shared that he was ready to go to rehab for a few days. Ch shared a prayer shawl w/ pt as a token of care on behalf of the Pastoral Care Dept. Pt was appreciative of the shawl and f/u care. Ch provided words of encouragement for pt and wished him a safe journey to the SNF.      03/19/19 1400  Clinical Encounter Type  Visited With Patient  Visit Type Follow-up;Psychological support;Spiritual support;Social support;Post-op  Spiritual Encounters  Spiritual Needs Grief support;Emotional  Stress Factors  Patient Stress Factors Health changes;Major life changes  Family Stress Factors None identified

## 2019-03-19 NOTE — Progress Notes (Signed)
[facility] Daily Progress Note    Subjective: 03/13/19: 1. Left below-the-knee amputation  03/12/19: 1. Left common femoral andprofunda femoris endarterectomies and patch angioplasty 2.Fogarty embolectomy of the left superficial femoral and popliteal arteries  03/04/19: 1.Ultrasound guidance for vascular accessrightfemoral artery 2.Catheter placement into left common femoral artery from right femoral approach 3.Aortogram and selectiveleftlower extremity angiogram 4.Percutaneous transluminal angioplasty ofleft anterior tibial artery with 3 mm diameter and 4 mm diameter angioplasty balloons 5.Percutaneous transluminal angioplasty of the left SFA and popliteal arteries with 5 mm diameter Lutonix drug-coated angioplasty balloon and 6 mm diameter high-pressure angioplasty balloons 6.Mechanical thrombectomy using the penumbra cat 6 device to the left common femoral artery, SFA, popliteal artery, and anterior tibial arteries 7.Viabahn stent placement to the proximal left SFA with a 6 mm diameter by 7.5 cm length covered stent 8.Upon stent placement to the left anterior tibial artery with 5 mm diameter by 10 cm length covered stent 9.StarClose closure device rightfemoral artery  No issues overnight. For discharge to rehab today.   Objective: Vitals:   03/18/19 1716 03/18/19 1958 03/19/19 0445 03/19/19 0740  BP: (!) 146/82 121/80 138/78 (!) 147/84  Pulse: 91 97 88 88  Resp:  18 18 16   Temp:   98.7 F (37.1 C) 98.4 F (36.9 C)  TempSrc:   Oral   SpO2:  100% 100% 100%  Weight:      Height:        Intake/Output Summary (Last 24 hours) at 03/19/2019 1049 Last data filed at 03/19/2019 0900 Gross per 24 hour  Intake 120 ml  Output 800 ml  Net -680 ml   Physical Exam: A&Ox3, NAD CV: RRR Pulmonary: CTA Bilaterally Abdomen:  Soft, Nontender, Nondistended Left Groin:  Healing well. Incision - clean, dry and intact. Thigh soft.    Vascular:  Left Below the Knee Amputation: Healing well. Staple line, clean and  intact. Stump warm and healthy.     Laboratory: CBC    Component Value Date/Time   WBC 11.1 (H) 03/18/2019 0426   HGB 7.9 (L) 03/18/2019 0426   HCT 25.2 (L) 03/18/2019 0426   PLT 727 (H) 03/18/2019 0426    BMET    Component Value Date/Time   NA 136 03/16/2019 0545   K 4.1 03/16/2019 0545   CL 102 03/16/2019 0545   CO2 25 03/16/2019 0545   GLUCOSE 151 (H) 03/16/2019 0545   BUN 11 03/16/2019 0545   CREATININE 0.69 03/16/2019 0545   CALCIUM 8.0 (L) 03/16/2019 0545   GFRNONAA >60 03/16/2019 0545   GFRAA >60 03/16/2019 0545   Assessment/Planning: The patient is a 63 year old with multiple medical issues including PAD s/p left femoral endarterectomy and left BKA 1) Surgical sites healing well.  2) OK to discharge from vascular perspective when medically stable. 3) Follow up as an outpatient in two weeks to start staple removal.  Discussed with Dr. Ellis Parents [facility] PA-C 03/19/2019 10:49 AM

## 2019-03-19 NOTE — Progress Notes (Signed)
Called report to [place] at [facility]. EMS called for transport. All belongings packed. PIV removed and VSS.   Bethann Punches, RN

## 2019-03-19 NOTE — Progress Notes (Addendum)
Daily Progress Note   Patient Name: Richard [name]       Date: 03/19/2019 DOB: 06/17/1956  Age: 63 y.o. MRN#: [id number] Attending Physician: Bettey Costa, MD Primary Care Physician: [facility] [place] Date: 02/28/2019  Reason for Consultation/Follow-up: Establishing goals of care  Subjective: Patient is resting in bed with stump raised on pillow. He is very happy to go to rehab. He states he has continued to have pain in the stump which is worse when working with therapy. The pain is throbbing and burning.  Neurontin was increased by psychiatry for both pain and substance abuse. When medically appropriate, will need to wean from Neurontin in place of suddenly stopping. Percocet was changed to 1-2 tabs q 6 hours PRN. Flexeril change to PRN dosing from scheduled. IV Dilaudid was discontinued 2/2 disuses and impending transfer to SNF. Tylenol now PRN dosing.   Percocet 5/325mg  1-2 q 6h PRN for pain.  Neurontin 200mg  BID and 400 nightly for neuropathic pain/substance abuse (per psych orders). Flexeril 10mg  TID PRN for muscle spasms. Tylenol 650mg  q6h PRN for pain.  Please provide PRN medications as needed to help aleve peaks and troughs. Will need to wean as able. Hopeful multimodal therapy will help with pain relief.   For weaning, could provide taper such as:   Percocet 5/325mg  1-2 q6 hours PRN for 5-7 days Percocet 5/325mg  q6 hours PRN for 7 days  Percocet 5/325mg  q 8 hours PRN for 5 days  Percocet 5/325mg  q 12 hours PRN for 5 days  Percocet 5/325 mg 1 tab daily PRN for 4 days.   Recommend palliative to follow outpatient.      Length of Stay: 19  Current Medications: Scheduled Meds:  . apixaban  5 mg Oral BID  . aspirin  81 mg Oral Daily  . carvedilol  3.125 mg  Oral BID WC  . gabapentin  200 mg Oral BID WC  . gabapentin  400 mg Oral QHS  . insulin aspart  0-15 Units Subcutaneous TID WC  . insulin aspart  0-5 Units Subcutaneous QHS  . insulin aspart  10 Units Subcutaneous TID WC  . insulin glargine  19 Units Subcutaneous Daily  . mouth rinse  15 mL Mouth Rinse BID  . Ensure Max Protein  11 oz Oral BID BM  .  rosuvastatin  40 mg Oral q1800  . sodium chloride flush  10 mL Intravenous Q12H  . ticagrelor  90 mg Oral BID    Continuous Infusions: . sodium chloride Stopped (03/18/19 0836)    PRN Meds: acetaminophen, cyclobenzaprine, naLOXone (NARCAN)  injection, ondansetron (ZOFRAN) IV, oxyCODONE-acetaminophen, sodium chloride flush  Physical Exam Pulmonary:     Effort: Pulmonary effort is normal.  Skin:    General: Skin is warm and dry.     Comments: Left stump wrapped in Kirlix.   Neurological:     Mental Status: He is alert.     Comments: Oriented             Vital Signs: BP (!) 147/84 (BP Location: Left Arm)   Pulse 88   Temp 98.4 F (36.9 C)   Resp 16   Ht 5\' 5"  (1.651 m)   Wt 57.3 kg   SpO2 100%   BMI 21.02 kg/m  SpO2: SpO2: 100 % O2 Device: O2 Device: Room Air O2 Flow Rate: O2 Flow Rate (L/min): 6 L/min  Intake/output summary:   Intake/Output Summary (Last 24 hours) at 03/19/2019 1019 Last data filed at 03/19/2019 0900 Gross per 24 hour  Intake 120 ml  Output 800 ml  Net -680 ml   LBM: Last BM Date: 03/18/19 Baseline Weight: Weight: 54 kg Most recent weight: Weight: 57.3 kg       Palliative Assessment/Data:    Flowsheet Rows     Most Recent Value  Intake Tab  Referral Department  Critical care  Unit at Time of Referral  ICU  Palliative Care Primary Diagnosis  Cancer  Date Notified  02/28/19  Palliative Care Type  New Palliative care  Reason for referral  Clarify Goals of Care  Date of Admission  02/28/19  Date first seen by Palliative Care  03/02/19  # of days Palliative referral response time  2  Day(s)  # of days IP prior to Palliative referral  0  Clinical Assessment  Psychosocial & Spiritual Assessment  Palliative Care Outcomes      Patient Active Problem List   Diagnosis Date Noted  . Cocaine use disorder ([place])   . Pressure injury of skin 03/12/2019  . Respiratory failure ([place]) 02/28/2019  . Diabetes with hyperosmolar coma ([place]) 11/11/2018  . A-fib ([place]) 09/20/2018  . Hyperglycemia 08/03/2018  . Protein-calorie malnutrition, severe 01/10/2018  . Sepsis ([place]) 01/09/2018  . DKA, type 2 ([place]) 09/05/2017  . Ileus ([place]) 08/24/2017  . Diabetes ([place]) 08/24/2017  . Atrial flutter ([place]) 08/24/2017  . CAD (coronary artery disease) 08/24/2017  . HTN (hypertension) 08/24/2017  . Atrial fibrillation ([place]) 08/24/2017  . Tobacco use disorder, severe, dependence 07/25/2017  . Wound dehiscence, surgical, initial encounter 06/24/2017  . Acute hematogenous osteomyelitis of right foot ([place]) 06/20/2017  . Adenocarcinoma of pancreas ([place]) 06/20/2017  . Esophagitis 06/20/2017  . Cellulitis of right lower extremity 06/10/2017  . Hypertension, poor control 06/10/2017  . Insulin dependent type 2 diabetes mellitus ([place]) 06/10/2017  . Chest pain 08/10/2015    Palliative Care Assessment & Plan    Recommendations/Plan:  Pain medications adjusted for pain relief. Please see above.  Appreciate psych consult.     Code Status:    Code Status Orders  (From admission, onward)         Start     Ordered   03/02/19 1454  Full code  Continuous     03/02/19 1454        Code Status  History    Date Active Date Inactive Code Status Order ID Comments User Context   02/28/2019 1449 03/01/2019 0836 Full Code [id number]  Gorden Harms, MD Inpatient   02/28/2019 1449 02/28/2019 1449 Full Code [id number]  Gorden Harms, MD Inpatient   11/11/2018 0758 11/18/2018 1814 DNR [id number]  Loletha Grayer, MD ED   09/20/2018 1635 09/24/2018 1854 Full Code [id number]  Fritzi Mandes, MD ED   08/03/2018  1020 08/04/2018 1813 Full Code [id number]  Salary, Avel Peace, MD ED   01/09/2018 1025 01/11/2018 1932 Full Code [id number]  Harrie Foreman, MD Inpatient   09/05/2017 1559 09/11/2017 1928 Full Code [id number]  Fritzi Mandes, MD Inpatient   08/24/2017 0238 08/29/2017 2131 Full Code [id number]  Lance Coon, MD Inpatient   08/10/2015 0822 08/11/2015 1947 Full Code [id number]  Harrie Foreman, MD Inpatient       Prognosis:   Unable to determine  Discharge Planning:  To Be Determined   Thank you for allowing the Palliative Medicine Team to assist in the care of this patient.   Total Time 35 min Prolonged Time Billed  no       Greater than 50%  of this time was spent counseling and coordinating care related to the above assessment and plan.  Asencion Gowda, NP  Please contact Palliative Medicine Team phone at [phone] for questions and concerns.

## 2019-03-19 NOTE — TOC Transition Note (Signed)
Transition of Care [facility]) - CM/SW Discharge Note   Patient Details  Name: Richard [name] MRN: [id number] Date of Birth: Aug 17, 1956  Transition of Care [facility]) CM/SW Contact:  Su Hilt, RN Phone Number: 03/19/2019, 2:19 PM   Clinical Narrative:    Patient to DC via EMS to [facility] room 11 B Bed side nurse to call report to [phone] when ready to DC, Patient is aware and will notify his family DC packet including signed DNR on chart    Final next level of care: Skilled Nursing Facility Barriers to Discharge: Barriers Resolved   Patient Goals and CMS Choice        Discharge Placement                       Discharge Plan and Services In-house Referral: Hospice / Palliative Care Discharge Planning Services: CM Consult                                 Social Determinants of Health (SDOH) Interventions     Readmission Risk Interventions Readmission Risk Prevention Plan 03/02/2019  Transportation Screening Complete  HRI or Home Care Consult Complete  Palliative Care Screening Complete  Medication Review (RN Care Manager) Complete  Some recent data might be hidden

## 2019-03-19 NOTE — Progress Notes (Signed)
[place] [facility] [place] NAME: Richard [name]    MR#:  [id number]  DATE OF BIRTH:  Apr 03, 1956  SUBJECTIVE:   ready to be discharged  REVIEW OF SYSTEMS:    Review of Systems  Constitutional: Negative for fever, chills weight loss HENT: Negative for ear pain, nosebleeds, congestion, facial swelling, rhinorrhea, neck pain, neck stiffness and ear discharge.   Respiratory: Negative for cough, shortness of breath, wheezing  Cardiovascular: Negative for chest pain, palpitations and leg swelling.  Gastrointestinal: Negative for heartburn, abdominal pain, vomiting, diarrhea or consitpation Genitourinary: Negative for dysuria, urgency, frequency, hematuria Musculoskeletal: Negative for back pain or joint pain Neurological: Negative for dizziness, seizures, syncope, focal weakness,  numbness and headaches.  Hematological: Does not bruise/bleed easily.  Psychiatric/Behavioral: Negative for hallucinations, confusion, dysphoric mood    Tolerating Diet: yes      DRUG ALLERGIES:  No Known Allergies  VITALS:  Blood pressure (!) 147/84, pulse 88, temperature 98.4 F (36.9 C), resp. rate 16, height 5\' 5"  (1.651 m), weight 57.3 kg, SpO2 100 %.  PHYSICAL EXAMINATION:  Constitutional: Appears well-developed and well-nourished. No distress. HENT: Normocephalic. Marland Kitchen Oropharynx is clear and moist.  Eyes: Conjunctivae and EOM are normal. PERRLA, no scleral icterus.  Neck: Normal ROM. Neck supple. No JVD. No tracheal deviation. CVS: RRR, S1/S2 +, no murmurs, no gallops, no carotid bruit.  Pulmonary: Effort and breath sounds normal, no stridor, rhonchi, wheezes, rales.  Abdominal: Soft. BS +,  no distension, tenderness, rebound or guarding.  Musculoskeletal: left BKA No edema and no tenderness.  Neuro: Alert. CN 2-12 grossly intact. No focal deficits. Skin: sacral pressure injury POAPsychiatric: Normal mood and affect.      LABORATORY PANEL:   CBC Recent  Labs  Lab 03/18/19 0426  WBC 11.1*  HGB 7.9*  HCT 25.2*  PLT 727*   ------------------------------------------------------------------------------------------------------------------  Chemistries  Recent Labs  Lab 03/13/19 0512  03/16/19 0545  NA 135   < > 136  K 4.2   < > 4.1  CL 100   < > 102  CO2 27   < > 25  GLUCOSE 362*   < > 151*  BUN 25*   < > 11  CREATININE 0.76   < > 0.69  CALCIUM 7.8*   < > 8.0*  MG 2.3  --   --    < > = values in this interval not displayed.   ------------------------------------------------------------------------------------------------------------------  Cardiac Enzymes No results for input(s): TROPONINI in the last 168 hours. ------------------------------------------------------------------------------------------------------------------  RADIOLOGY:  No results found.   ASSESSMENT AND PLAN:    63 year old male with PAF on Eliquis, PVD and diabetes who presented to the emergency room due to unresponsiveness and acute hypoxic respiratory failure.  1.  Sepsis: Patient presented with leukocytosis and hypotension.  Sepsis due to Proteus vulgaris bacteremia due to gangrenous toes.  Patient was evaluated by infectious disease. Patient wastreated with IV antibiotics with Zosyn and has completed treatment.   2.  Severe peripheral vascular disease status post left femoral endarterectomy and left BKA POD #5 OR dressings removed by vascular surgery.  Surgical sites healing well. Patient will need outpatient follow-up in 2 weeks to start staple removal.  3.  Acute hypoxic respiratory failure on admission status post intubation/extubation: No acute events.  4.  Status post NSTEMI with inferoposterior myocardial infarction: Conservative management as per cardiology. Continue Brilinta and statin. He is too high risk for bleeding with Eliquis, Brillanta and ASA. Cardiology follow  up 1 week after discharge.   5.  Cocaine use disorder:  Patient wasevaluated by psychiatry. Patient will try to minimize substance abuse  6.  PAF: Continue Eliquisand Coreg for heart rate control 7.  Diabetes: Continue ADA diet, Lantus and Novolog  8.  Acute kidney injury which improved with IV fluids  9.  History of pancreatic cancer 10.  Anemia of chronic disease: Patient status post 4 unit PRBC.   Management plans discussed with the patient and he is in agreement.  CODE STATUS: DNR  TOTAL TIME TAKING CARE OF THIS PATIENT: 28 minutes.     POSSIBLE D/C awaiting insurance approval/palcement   Bettey Costa M.D on 03/19/2019 at 8:50 AM  Between 7am to 6pm - Pager - [phone] After 6pm go to [email or web address] - password [email or web address] [place] [facility]  Office  [phone]  CC: Primary care physician; Center, [place]  Note: This dictation was prepared with Diplomatic Services operational officer dictation along with smaller phrase technology. Any transcriptional errors that result from this process are unintentional.

## 2019-04-14 ENCOUNTER — Other Ambulatory Visit: Payer: Self-pay

## 2019-04-14 ENCOUNTER — Ambulatory Visit (INDEPENDENT_AMBULATORY_CARE_PROVIDER_SITE_OTHER): Payer: Medicare Other | Admitting: Nurse Practitioner

## 2019-04-14 ENCOUNTER — Encounter (INDEPENDENT_AMBULATORY_CARE_PROVIDER_SITE_OTHER): Payer: Self-pay | Admitting: Nurse Practitioner

## 2019-04-14 VITALS — BP 105/60 | HR 79 | Resp 10 | Ht 71.0 in

## 2019-04-14 DIAGNOSIS — Z79899 Other long term (current) drug therapy: Secondary | ICD-10-CM

## 2019-04-14 DIAGNOSIS — Z89512 Acquired absence of left leg below knee: Secondary | ICD-10-CM

## 2019-04-14 DIAGNOSIS — F17200 Nicotine dependence, unspecified, uncomplicated: Secondary | ICD-10-CM

## 2019-04-14 DIAGNOSIS — I10 Essential (primary) hypertension: Secondary | ICD-10-CM

## 2019-04-14 DIAGNOSIS — S88112A Complete traumatic amputation at level between knee and ankle, left lower leg, initial encounter: Secondary | ICD-10-CM

## 2019-04-20 ENCOUNTER — Encounter (INDEPENDENT_AMBULATORY_CARE_PROVIDER_SITE_OTHER): Payer: Self-pay | Admitting: Nurse Practitioner

## 2019-04-20 ENCOUNTER — Telehealth (INDEPENDENT_AMBULATORY_CARE_PROVIDER_SITE_OTHER): Payer: Self-pay

## 2019-04-20 ENCOUNTER — Telehealth (INDEPENDENT_AMBULATORY_CARE_PROVIDER_SITE_OTHER): Payer: Self-pay | Admitting: Nurse Practitioner

## 2019-04-20 DIAGNOSIS — S88112A Complete traumatic amputation at level between knee and ankle, left lower leg, initial encounter: Secondary | ICD-10-CM | POA: Insufficient documentation

## 2019-04-20 NOTE — Progress Notes (Signed)
SUBJECTIVE:  Patient ID: Richard [name], male    DOB: 23-Mar-1956, 63 y.o.   MRN: [id number] Chief Complaint  Patient presents with   Follow-up    HPI  Richard [name] is a 63 y.o. male that presents today for staple removal from his left below-knee amputation, however the nurses at his nursing facility have already remove staples and apply Steri-Strips.  There is area of bleeding near the lateral edge.  There is also evidence that there may be some slight dehiscence near the lateral edge of the wound.  Otherwise, the patient denies any issues.  He has been working with physical therapy at this time in order to regain his strength to prepare for prosthesis.  Patient is eager to place a stump shrinker at this time.  He denies any fevers, chills, nausea, vomiting or diarrhea.  Past Medical History:  Diagnosis Date   Atrial fibrillation ([place])    per patient   Cancer ([place])    CHF (congestive heart failure) (HCC)    Chronic back pain    Chronic leg pain    Coronary artery disease    Diabetes mellitus without complication ([place])    Hypertension    Neuropathy     Past Surgical History:  Procedure Laterality Date   AMPUTATION Left 03/13/2019   Procedure: AMPUTATION BELOW KNEE;  Surgeon: Algernon Huxley, MD;  Location: [facility] ORS;  Service: Vascular;  Laterality: Left;   CORONARY ANGIOPLASTY WITH STENT PLACEMENT     EMBOLECTOMY Left 03/12/2019   Procedure: EMBOLECTOMY SFA POPLITEAL;  Surgeon: Algernon Huxley, MD;  Location: [facility] ORS;  Service: Vascular;  Laterality: Left;   ENDARTERECTOMY FEMORAL Left 03/12/2019   Procedure: ENDARTERECTOMY FEMORAL;  Surgeon: Algernon Huxley, MD;  Location: [facility] ORS;  Service: Vascular;  Laterality: Left;   ESOPHAGOGASTRODUODENOSCOPY N/A 09/11/2017   Procedure: ESOPHAGOGASTRODUODENOSCOPY (EGD);  Surgeon: Virgel Manifold, MD;  Location[facility] ENDOSCOPY;  Service: Endoscopy;  Laterality: N/A;   LOWER EXTREMITY ANGIOGRAPHY Left 11/17/2018   Procedure: Lower  Extremity Angiography, with possible intervention;  Surgeon: Algernon Huxley, MD;  Location: [place] CV LAB;  Service: Cardiovascular;  Laterality: Left;   LOWER EXTREMITY ANGIOGRAPHY Left 03/04/2019   Procedure: Lower Extremity Angiography;  Surgeon: Algernon Huxley, MD;  Location: [place] CV LAB;  Service: Cardiovascular;  Laterality: Left;   pancreas removed      Social History   Socioeconomic History   Marital status: Single    Spouse name: Not on file   Number of children: Not on file   Years of education: Not on file   Highest education level: Not on file  Occupational History   Not on file  Social Needs   Financial resource strain: Not on file   Food insecurity    Worry: Not on file    Inability: Not on file   Transportation needs    Medical: Not on file    Non-medical: Not on file  Tobacco Use   Smoking status: Current Every Day Smoker    Packs/day: 0.50   Smokeless tobacco: Never Used  Substance and Sexual Activity   Alcohol use: No    Alcohol/week: 0.0 standard drinks   Drug use: Yes    Types: 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 Cocaine used over a year ago per pt.   Sexual activity: Not on file  Lifestyle   Physical activity    Days per week: Not on file    Minutes per  session: Not on file   Stress: Not on file  Relationships   Social connections    Talks on phone: Not on file    Gets together: Not on file    Attends religious service: Not on file    Active member of club or organization: Not on file    Attends meetings of clubs or organizations: Not on file    Relationship status: Not on file   Intimate partner violence    Fear of current or ex partner: Not on file    Emotionally abused: Not on file    Physically abused: Not on file    Forced sexual activity: Not on file  Other Topics Concern   Not on file  Social History Narrative   Not on file    Family History  Problem Relation Age of Onset   CAD  Mother    Diabetes Mellitus II Mother    Diabetes Mellitus II Father     No Known Allergies   Review of Systems   Review of Systems: Negative Unless Checked Constitutional: [] Weight loss  [] Fever  [] Chills Cardiac: [] Chest pain   [x]  Atrial Fibrillation  [] Palpitations   [] Shortness of breath when laying flat   [] Shortness of breath with exertion. [] Shortness of breath at rest Vascular:  [] Pain in legs with walking   [] Pain in legs with standing [] Pain in legs when laying flat   [] Claudication    [] Pain in feet when laying flat    [] History of DVT   [] Phlebitis   [] Swelling in legs   [] Varicose veins   [] Non-healing ulcers Pulmonary:   [] Uses home oxygen   [] Productive cough   [] Hemoptysis   [] Wheeze  [] COPD   [] Asthma Neurologic:  [] Dizziness   [] Seizures  [] Blackouts [] History of stroke   [] History of TIA  [] Aphasia   [] Temporary Blindness   [] Weakness or numbness in arm   [] Weakness or numbness in leg Musculoskeletal:   [] Joint swelling   [] Joint pain   [] Low back pain  []  History of Knee Replacement [] Arthritis [] back Surgeries  []  Spinal Stenosis    Hematologic:  [] Easy bruising  [] Easy bleeding   [] Hypercoagulable state   [] Anemic Gastrointestinal:  [] Diarrhea   [] Vomiting  [] Gastroesophageal reflux/heartburn   [] Difficulty swallowing. [] Abdominal pain Genitourinary:  [] Chronic kidney disease   [] Difficult urination  [] Anuric   [] Blood in urine [] Frequent urination  [] Burning with urination   [] Hematuria Skin:  [] Rashes   [] Ulcers [x] Wounds Psychological:  [] History of anxiety   []  History of major depression  []  Memory Difficulties      OBJECTIVE:   Physical Exam  BP 105/60 (BP Location: Right Arm, Patient Position: Sitting, Cuff Size: Normal)    Pulse 79    Resp 10    Ht 5' 11"  (1.803 m)    BMI 17.62 kg/m   Gen: WD/WN, NAD Head: [place]/AT, No temporalis wasting.  Ear/Nose/Throat: Hearing grossly intact, nares w/o erythema or drainage Eyes: PER, EOMI, sclera nonicteric.  Neck:  Supple, no masses.  No JVD.  Pulmonary:  Good air movement, no use of accessory muscles.  Cardiac: RRR Vascular:  Vessel Right Left  Radial Palpable Palpable   Gastrointestinal: soft, non-distended. No guarding/no peritoneal signs.  Musculoskeletal: M/S 5/5 throughout.    Left below-knee amputation Neurologic: Pain and light touch intact in extremities.  Symmetrical.  Speech is fluent. Motor exam as listed above. Psychiatric: Judgment intact, Mood & affect appropriate for pt's clinical situation. Dermatologic: No Venous rashes. No Ulcers Noted.  No changes consistent with cellulitis. Lymph : No Cervical lymphadenopathy, no lichenification or skin changes of chronic lymphedema.       ASSESSMENT AND PLAN:  1. Below-knee amputation of left lower extremity (HCC) It appears that there is a small area of dehiscence under the Steri-Strips.  The patient is eager to have a shrinker sock placed however I explained to the patient that we want to have his wound fully healed before we do this in order to avoid worsening or complete dehiscence of his wound.  We will have the patient return in 3 weeks for evaluation.  2. Essential hypertension Continue antihypertensive medications as already ordered, these medications have been reviewed and there are no changes at this time.   3. Tobacco use disorder, severe, dependence Smoking cessation was discussed, 3-10 minutes spent on this topic specifically    Current Outpatient Medications on File Prior to Visit  Medication Sig Dispense Refill   ACCU-CHEK AVIVA PLUS test strip U ONCE TO BID UTD 100 each 0   ACCU-CHEK SOFTCLIX LANCETS lancets TEST 1-2 XD 100 each 0   acetaminophen (TYLENOL) 325 MG tablet Take 1 tablet (325 mg total) by mouth every 6 (six) hours as needed for mild pain (or Fever >/= 101). 30 tablet 0   apixaban (ELIQUIS) 5 MG TABS tablet Take 1 tablet (5 mg total) by mouth 2 (two) times daily. 60 tablet 0   Blood Glucose Monitoring  Suppl (ACCU-CHEK AVIVA PLUS) w/Device KIT U TO TEST ONCE TO BID 1 kit 1   carvedilol (COREG) 3.125 MG tablet Take 1 tablet (3.125 mg total) by mouth 2 (two) times daily with a meal.     Ensure Max Protein (ENSURE MAX PROTEIN) LIQD Take 330 mLs (11 oz total) by mouth 2 (two) times daily between meals.     gabapentin (NEURONTIN) 100 MG capsule Take 1 capsule (100 mg total) by mouth 3 (three) times daily. 180 capsule 3   insulin aspart (NOVOLOG) 100 UNIT/ML injection Inject 0-5 Units into the skin at bedtime. CBG < 70: implement hypoglycemia protocol CBG 70 - 120: 0 units CBG 121 - 150: 0 units CBG 151 - 200: 0 units CBG 201 - 250: 2 units CBG 251 - 300: 3 units CBG 301 - 350: 4 units CBG 351 - 400: 5 units CBG > 400: call MD 10 mL 11   insulin aspart (NOVOLOG) 100 UNIT/ML injection Inject 10 Units into the skin 3 (three) times daily with meals. 10 mL 11   insulin glargine (LANTUS) 100 UNIT/ML injection Inject 0.19 mLs (19 Units total) into the skin daily. 10 mL 11   Multiple Vitamin (MULTIVITAMIN WITH MINERALS) TABS tablet Take 1 tablet by mouth daily. 30 tablet 1   Needles & Syringes MISC Use as directed 100 each 3   oxyCODONE-acetaminophen (PERCOCET/ROXICET) 5-325 MG tablet Take 1 tablet by mouth every 6 (six) hours as needed for moderate pain or severe pain. 30 tablet 0   pantoprazole (PROTONIX) 40 MG tablet Take 1 tablet (40 mg total) by mouth daily. 30 tablet 0   rosuvastatin (CRESTOR) 40 MG tablet Take 1 tablet (40 mg total) by mouth daily at 6 PM.     ticagrelor (BRILINTA) 90 MG TABS tablet Take 1 tablet (90 mg total) by mouth 2 (two) times daily. 60 tablet    No current facility-administered medications on file prior to visit.     There are no Patient Instructions on file for this visit. No follow-ups on file.  Kris Hartmann, NP  This note was completed with Sales executive.  Any errors are purely unintentional.

## 2019-04-20 NOTE — Telephone Encounter (Signed)
I spoke with Arbie Cookey and the patient is schedule to come in tomorrow

## 2019-04-20 NOTE — Telephone Encounter (Signed)
Let's see if we can have the patient come in a little sooner so that we can evaluate. Me or dew

## 2019-04-20 NOTE — Telephone Encounter (Signed)
I have spoken with her directly

## 2019-04-21 ENCOUNTER — Ambulatory Visit (INDEPENDENT_AMBULATORY_CARE_PROVIDER_SITE_OTHER): Payer: Medicare Other | Admitting: Vascular Surgery

## 2019-04-28 ENCOUNTER — Encounter (INDEPENDENT_AMBULATORY_CARE_PROVIDER_SITE_OTHER): Payer: Self-pay | Admitting: Vascular Surgery

## 2019-04-28 ENCOUNTER — Ambulatory Visit (INDEPENDENT_AMBULATORY_CARE_PROVIDER_SITE_OTHER): Payer: Medicare Other | Admitting: Vascular Surgery

## 2019-04-28 ENCOUNTER — Other Ambulatory Visit: Payer: Self-pay

## 2019-04-28 VITALS — BP 103/66 | HR 84 | Resp 10 | Ht 71.0 in | Wt 135.0 lb

## 2019-04-28 DIAGNOSIS — I739 Peripheral vascular disease, unspecified: Secondary | ICD-10-CM

## 2019-04-28 DIAGNOSIS — S88112A Complete traumatic amputation at level between knee and ankle, left lower leg, initial encounter: Secondary | ICD-10-CM

## 2019-04-28 DIAGNOSIS — Z79899 Other long term (current) drug therapy: Secondary | ICD-10-CM

## 2019-04-28 DIAGNOSIS — Z794 Long term (current) use of insulin: Secondary | ICD-10-CM

## 2019-04-28 DIAGNOSIS — Z89512 Acquired absence of left leg below knee: Secondary | ICD-10-CM

## 2019-04-28 DIAGNOSIS — I10 Essential (primary) hypertension: Secondary | ICD-10-CM

## 2019-04-28 DIAGNOSIS — E1151 Type 2 diabetes mellitus with diabetic peripheral angiopathy without gangrene: Secondary | ICD-10-CM

## 2019-04-28 DIAGNOSIS — E1152 Type 2 diabetes mellitus with diabetic peripheral angiopathy with gangrene: Secondary | ICD-10-CM

## 2019-04-28 NOTE — Assessment & Plan Note (Signed)
blood glucose control important in reducing the progression of atherosclerotic disease. Also, involved in wound healing. On appropriate medications.  

## 2019-04-28 NOTE — Assessment & Plan Note (Signed)
blood pressure control important in reducing the progression of atherosclerotic disease. On appropriate oral medications.  

## 2019-04-28 NOTE — Assessment & Plan Note (Signed)
Healing well.  Agree with prosthesis.  Recheck in 3 months

## 2019-04-28 NOTE — Assessment & Plan Note (Signed)
Has already lost the left leg.  Plan to check perfusion in right leg in about 3 months

## 2019-04-28 NOTE — Progress Notes (Signed)
Patient ID: Richard [name], male   DOB: 1956-08-27, 63 y.o.   MRN: [id number]  Chief Complaint  Patient presents with  . Follow-up    HPI Richard [name] is a 63 y.o. male.  Patient returns in follow-up.  His left below-knee amputation site and left groin incision from femoral endarterectomy are both very well-healed.  He has already seen the prosthetists and has started the ball rolling on getting his prosthesis. He still has some pain in the stump.   Past Medical History:  Diagnosis Date  . Atrial fibrillation ([place])    per patient  . Cancer ([place])   . CHF (congestive heart failure) ([place])   . Chronic back pain   . Chronic leg pain   . Coronary artery disease   . Diabetes mellitus without complication ([place])   . Hypertension   . Neuropathy     Past Surgical History:  Procedure Laterality Date  . AMPUTATION Left 03/13/2019   Procedure: AMPUTATION BELOW KNEE;  Surgeon: Algernon Huxley, MD;  Location: [facility] ORS;  Service: Vascular;  Laterality: Left;  . CORONARY ANGIOPLASTY WITH STENT PLACEMENT    . EMBOLECTOMY Left 03/12/2019   Procedure: EMBOLECTOMY SFA POPLITEAL;  Surgeon: Algernon Huxley, MD;  Location: [facility] ORS;  Service: Vascular;  Laterality: Left;  . ENDARTERECTOMY FEMORAL Left 03/12/2019   Procedure: ENDARTERECTOMY FEMORAL;  Surgeon: Algernon Huxley, MD;  Location: [facility] ORS;  Service: Vascular;  Laterality: Left;  . ESOPHAGOGASTRODUODENOSCOPY N/A 09/11/2017   Procedure: ESOPHAGOGASTRODUODENOSCOPY (EGD);  Surgeon: Virgel Manifold, MD;  Location[facility] ENDOSCOPY;  Service: Endoscopy;  Laterality: N/A;  . LOWER EXTREMITY ANGIOGRAPHY Left 11/17/2018   Procedure: Lower Extremity Angiography, with possible intervention;  Surgeon: Algernon Huxley, MD;  Location: [place] CV LAB;  Service: Cardiovascular;  Laterality: Left;  . LOWER EXTREMITY ANGIOGRAPHY Left 03/04/2019   Procedure: Lower Extremity Angiography;  Surgeon: Algernon Huxley, MD;  Location: [place] CV LAB;  Service:  Cardiovascular;  Laterality: Left;  . pancreas removed        No Known Allergies  Current Outpatient Medications  Medication Sig Dispense Refill  . acetaminophen (TYLENOL) 325 MG tablet Take 1 tablet (325 mg total) by mouth every 6 (six) hours as needed for mild pain (or Fever >/= 101). 30 tablet 0  . apixaban (ELIQUIS) 5 MG TABS tablet Take 1 tablet (5 mg total) by mouth 2 (two) times daily. 60 tablet 0  . carvedilol (COREG) 3.125 MG tablet Take 1 tablet (3.125 mg total) by mouth 2 (two) times daily with a meal.    . gabapentin (NEURONTIN) 100 MG capsule Take 1 capsule (100 mg total) by mouth 3 (three) times daily. 180 capsule 3  . insulin aspart (NOVOLOG) 100 UNIT/ML injection Inject 0-5 Units into the skin at bedtime. CBG < 70: implement hypoglycemia protocol CBG 70 - 120: 0 units CBG 121 - 150: 0 units CBG 151 - 200: 0 units CBG 201 - 250: 2 units CBG 251 - 300: 3 units CBG 301 - 350: 4 units CBG 351 - 400: 5 units CBG > 400: call MD 10 mL 11  . insulin aspart (NOVOLOG) 100 UNIT/ML injection Inject 10 Units into the skin 3 (three) times daily with meals. 10 mL 11  . insulin glargine (LANTUS) 100 UNIT/ML injection Inject 0.19 mLs (19 Units total) into the skin daily. 10 mL 11  . Multiple Vitamin (MULTIVITAMIN WITH MINERALS) TABS tablet Take 1 tablet by mouth daily. 30 tablet 1  .  oxyCODONE-acetaminophen (PERCOCET/ROXICET) 5-325 MG tablet Take 1 tablet by mouth every 6 (six) hours as needed for moderate pain or severe pain. 30 tablet 0  . pantoprazole (PROTONIX) 40 MG tablet Take 1 tablet (40 mg total) by mouth daily. 30 tablet 0  . rosuvastatin (CRESTOR) 40 MG tablet Take 1 tablet (40 mg total) by mouth daily at 6 PM.    . ticagrelor (BRILINTA) 90 MG TABS tablet Take 1 tablet (90 mg total) by mouth 2 (two) times daily. 60 tablet   . ACCU-CHEK AVIVA PLUS test strip U ONCE TO BID UTD 100 each 0  . ACCU-CHEK SOFTCLIX LANCETS lancets TEST 1-2 XD 100 each 0  . Blood Glucose Monitoring  Suppl (ACCU-CHEK AVIVA PLUS) w/Device KIT U TO TEST ONCE TO BID 1 kit 1  . Ensure Max Protein (ENSURE MAX PROTEIN) LIQD Take 330 mLs (11 oz total) by mouth 2 (two) times daily between meals.    . Needles & Syringes MISC Use as directed 100 each 3   No current facility-administered medications for this visit.         Physical Exam BP 103/66 (BP Location: Left Arm, Patient Position: Sitting, Cuff Size: Normal)   Pulse 84   Resp 10   Ht _0  (1.803 m)   Wt 135 lb (61.2 kg)   BMI 18.83 kg/m  Gen:  WD/WN, NAD Skin: incision C/D/I both the BKA site and left groin incision     Assessment/Plan:  Diabetes (HCC) blood glucose control important in reducing the progression of atherosclerotic disease. Also, involved in wound healing. On appropriate medications.   HTN (hypertension) blood pressure control important in reducing the progression of atherosclerotic disease. On appropriate oral medications.   Below-knee amputation of left lower extremity ([place]) Healing well.  Agree with prosthesis.  Recheck in 3 months  PAD (peripheral artery disease) ([place]) Has already lost the left leg.  Plan to check perfusion in right leg in about 3 months      Leotis Pain 04/28/2019, 11:03 AM   This note was created with Dragon medical transcription system.  Any errors from dictation are unintentional.

## 2019-04-29 ENCOUNTER — Other Ambulatory Visit (INDEPENDENT_AMBULATORY_CARE_PROVIDER_SITE_OTHER): Payer: Self-pay | Admitting: Nurse Practitioner

## 2019-05-05 ENCOUNTER — Ambulatory Visit (INDEPENDENT_AMBULATORY_CARE_PROVIDER_SITE_OTHER): Payer: Medicare Other | Admitting: Vascular Surgery

## 2019-06-24 ENCOUNTER — Telehealth (INDEPENDENT_AMBULATORY_CARE_PROVIDER_SITE_OTHER): Payer: Self-pay

## 2019-06-24 ENCOUNTER — Other Ambulatory Visit (INDEPENDENT_AMBULATORY_CARE_PROVIDER_SITE_OTHER): Payer: Self-pay | Admitting: Nurse Practitioner

## 2019-06-24 DIAGNOSIS — T8789 Other complications of amputation stump: Secondary | ICD-10-CM

## 2019-06-24 DIAGNOSIS — T879 Unspecified complications of amputation stump: Secondary | ICD-10-CM

## 2019-06-24 NOTE — Telephone Encounter (Signed)
We can bring him and lets get an art duplex on the left bka..can see me or dew

## 2019-06-26 ENCOUNTER — Ambulatory Visit (INDEPENDENT_AMBULATORY_CARE_PROVIDER_SITE_OTHER): Payer: Medicare Other | Admitting: Nurse Practitioner

## 2019-06-26 ENCOUNTER — Ambulatory Visit (INDEPENDENT_AMBULATORY_CARE_PROVIDER_SITE_OTHER): Payer: Medicare Other

## 2019-06-26 ENCOUNTER — Other Ambulatory Visit: Payer: Self-pay

## 2019-06-26 ENCOUNTER — Other Ambulatory Visit
Admission: RE | Admit: 2019-06-26 | Discharge: 2019-06-26 | Disposition: A | Discharge status: Home / Self Care | Payer: Medicare Other | Source: Ambulatory Visit | Attending: Vascular Surgery | Admitting: Vascular Surgery

## 2019-06-26 ENCOUNTER — Encounter (INDEPENDENT_AMBULATORY_CARE_PROVIDER_SITE_OTHER): Payer: Self-pay

## 2019-06-26 ENCOUNTER — Telehealth (INDEPENDENT_AMBULATORY_CARE_PROVIDER_SITE_OTHER): Payer: Self-pay

## 2019-06-26 ENCOUNTER — Encounter (INDEPENDENT_AMBULATORY_CARE_PROVIDER_SITE_OTHER): Payer: Self-pay | Admitting: Nurse Practitioner

## 2019-06-26 VITALS — BP 83/47 | HR 92 | Resp 16

## 2019-06-26 DIAGNOSIS — Z20828 Contact with and (suspected) exposure to other viral communicable diseases: Secondary | ICD-10-CM | POA: Diagnosis not present

## 2019-06-26 DIAGNOSIS — Z01812 Encounter for preprocedural laboratory examination: Secondary | ICD-10-CM | POA: Insufficient documentation

## 2019-06-26 DIAGNOSIS — T8789 Other complications of amputation stump: Secondary | ICD-10-CM

## 2019-06-26 DIAGNOSIS — I739 Peripheral vascular disease, unspecified: Secondary | ICD-10-CM

## 2019-06-26 DIAGNOSIS — T879 Unspecified complications of amputation stump: Secondary | ICD-10-CM | POA: Diagnosis not present

## 2019-06-26 DIAGNOSIS — M79605 Pain in left leg: Secondary | ICD-10-CM | POA: Diagnosis not present

## 2019-06-26 DIAGNOSIS — I10 Essential (primary) hypertension: Secondary | ICD-10-CM | POA: Diagnosis not present

## 2019-06-26 DIAGNOSIS — E1152 Type 2 diabetes mellitus with diabetic peripheral angiopathy with gangrene: Secondary | ICD-10-CM

## 2019-06-26 MED ORDER — OXYCODONE-ACETAMINOPHEN 5-325 MG PO TABS: 1.0000 | ORAL_TABLET | ORAL | 0 refills | Status: DC | PRN

## 2019-06-26 NOTE — Telephone Encounter (Signed)
Patient was seen today and is now scheduled with Dr. Lucky Cowboy for a leg angio on 07/01/2019 with a 6:45 am arrival time to the [facility]. Patient will do Covid testing today 06/26/2019 at the [facility]. Patient left our office at 2:35 to have his test done. Pre-procedure instructions were discussed and the paperwork was given to the patient.

## 2019-06-26 NOTE — Progress Notes (Signed)
SUBJECTIVE:  Patient ID: Richard [name], male    DOB: 22-Apr-1956, 63 y.o.   MRN: [id number] Chief Complaint  Patient presents with  . Follow-up    HPI  Richard [name] is a 63 y.o. male that presents today after his physical therapist called due to the patient having extreme pain in his stump as well as drainage.  At previous office visits the stump was well-healed with no evidence of drainage.  Today the patient states that the stump is extremely tender with a small area within the lateral portion that is recently open.  This open area does have some drainage associated with it.  The patient also states he is barely able to tolerate walking on his prosthesis.  He denies any fever, chills, nausea, vomiting or diarrhea.  He denies any chest pain or shortness of breath.  The patient also states that he has been having increased pain in his right lower extremity as well.  Today noninvasive studies including a left lower extremity arterial duplex revealed triphasic waveforms in the common femoral artery and deep femoral artery.  However the entire SFA and proximal popliteal artery are all occluded.  The patient has previous stent placement throughout the SFA.  Past Medical History:  Diagnosis Date  . Atrial fibrillation ([place])    per patient  . Cancer ([place])   . CHF (congestive heart failure) ([place])   . Chronic back pain   . Chronic leg pain   . Coronary artery disease   . Diabetes mellitus without complication ([place])   . Hypertension   . Neuropathy     Past Surgical History:  Procedure Laterality Date  . AMPUTATION Left 03/13/2019   Procedure: AMPUTATION BELOW KNEE;  Surgeon: Algernon Huxley, MD;  Location: [facility] ORS;  Service: Vascular;  Laterality: Left;  . CORONARY ANGIOPLASTY WITH STENT PLACEMENT    . EMBOLECTOMY Left 03/12/2019   Procedure: EMBOLECTOMY SFA POPLITEAL;  Surgeon: Algernon Huxley, MD;  Location: [facility] ORS;  Service: Vascular;  Laterality: Left;  . ENDARTERECTOMY FEMORAL Left  03/12/2019   Procedure: ENDARTERECTOMY FEMORAL;  Surgeon: Algernon Huxley, MD;  Location: [facility] ORS;  Service: Vascular;  Laterality: Left;  . ESOPHAGOGASTRODUODENOSCOPY N/A 09/11/2017   Procedure: ESOPHAGOGASTRODUODENOSCOPY (EGD);  Surgeon: Virgel Manifold, MD;  Location: [facility] ENDOSCOPY;  Service: Endoscopy;  Laterality: N/A;  . LOWER EXTREMITY ANGIOGRAPHY Left 11/17/2018   Procedure: Lower Extremity Angiography, with possible intervention;  Surgeon: Algernon Huxley, MD;  Location: [facility];  Service: Cardiovascular;  Laterality: Left;  . LOWER EXTREMITY ANGIOGRAPHY Left 03/04/2019   Procedure: Lower Extremity Angiography;  Surgeon: Algernon Huxley, MD;  Location: [facility];  Service: Cardiovascular;  Laterality: Left;  . pancreas removed      Social History   Socioeconomic History  . Marital status: Single    Spouse name: Not on file  . Number of children: Not on file  . Years of education: Not on file  . Highest education level: Not on file  Occupational History  . Not on file  Social Needs  . Financial resource strain: Not on file  . Food insecurity    Worry: Not on file    Inability: Not on file  . Transportation needs    Medical: Not on file    Non-medical: Not on file  Tobacco Use  . Smoking status: Current Every Day Smoker    Packs/day: 0.50  . Smokeless tobacco: Never Used  Substance and Sexual Activity  .  Alcohol use: No    Alcohol/week: 0.0 standard drinks  . 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 Cocaine used over a year ago per pt.  . Sexual activity: Not on file  Lifestyle  . Physical activity    Days per week: Not on file    Minutes per session: Not on file  . Stress: Not on file  Relationships  . Social Herbalist on phone: Not on file    Gets together: Not on file    Attends religious service: Not on file    Active member of club or organization: Not on file    Attends meetings of clubs or  organizations: Not on file    Relationship status: Not on file  . Intimate partner violence    Fear of current or ex partner: Not on file    Emotionally abused: Not on file    Physically abused: Not on file    Forced sexual activity: Not on file  Other Topics Concern  . Not on file  Social History Narrative  . Not on file    Family History  Problem Relation Age of Onset  . CAD Mother   . Diabetes Mellitus II Mother   . Diabetes Mellitus II Father     No Known Allergies   Review of Systems   Review of Systems: Negative Unless Checked Constitutional: _0 Weight loss  _1 Fever  _2 Chills Cardiac: _3 Chest pain   _4  Atrial Fibrillation  _5 Palpitations   _6 Shortness of breath when laying flat   _7 Shortness of breath with exertion. _8 Shortness of breath at rest Vascular:  _9 Pain in legs with walking   _10 Pain in legs with standing _11 Pain in legs when laying flat   _12 Claudication    _13 Pain in feet when laying flat    _14 History of DVT   _15 Phlebitis   _16 Swelling in legs   _17 Varicose veins   _18 Non-healing ulcers Pulmonary:   _19 Uses home oxygen   _20 Productive cough   _21 Hemoptysis   _22 Wheeze  _23 COPD   _24 Asthma Neurologic:  _25 Dizziness   _26 Seizures  _27 Blackouts _28 History of stroke   _29 History of TIA  _30 Aphasia   _31 Temporary Blindness   _32 Weakness or numbness in arm   _33 Weakness or numbness in leg Musculoskeletal:   _34 Joint swelling   _35 Joint pain   _36 Low back pain  _37  History of Knee Replacement _38 Arthritis _39 back Surgeries  _40  Spinal Stenosis    Hematologic:  _41 Easy bruising  _42 Easy bleeding   _43 Hypercoagulable state   _44 Anemic Gastrointestinal:  _45 Diarrhea   _46 Vomiting  _47 Gastroesophageal reflux/heartburn   _48 Difficulty swallowing. _49 Abdominal pain Genitourinary:  _50 Chronic kidney disease   _51 Difficult urination  _52 Anuric   _53 Blood in urine _54 Frequent urination  _55 Burning with urination   _56 Hematuria Skin:  _57 Rashes   _58 Ulcers _59 Wounds Psychological:  _60 History of anxiety   _61  History of major  depression  _62  Memory Difficulties      OBJECTIVE:   Physical Exam  BP (!) 83/47 (BP Location: Right Arm)   Pulse 92   Resp 16   Gen: WD/WN, NAD Head: [place]/AT, No temporalis wasting.  Ear/Nose/Throat: Hearing grossly intact, nares w/o erythema or drainage Eyes: PER, EOMI, sclera nonicteric.  Neck: Supple, no masses.  No JVD.  Pulmonary:  Good air movement, no use of accessory muscles.  Cardiac: RRR Vascular:  Left stump cold, nickel sized area of drainage, non palpable right pulses Vessel Right Left  Radial Palpable Palpable  Gastrointestinal: soft, non-distended. No guarding/no peritoneal signs.  Musculoskeletal: M/S 5/5 throughout.  No deformity or atrophy.  Neurologic: Pain and light touch intact in extremities.  Symmetrical.  Speech is fluent. Motor exam as listed above. Psychiatric: Judgment intact, Mood & affect appropriate for pt's clinical situation. Dermatologic: No Venous rashes.  Wound on left stump. No changes consistent with cellulitis. Lymph : No Cervical lymphadenopathy, no lichenification or skin changes of chronic lymphedema.       ASSESSMENT AND PLAN:  1. PAD (peripheral artery disease) ([employer]) Discussed the possibility with the patient that this may not be the only procedure in order to correct issues with his stump.  He may require an overnight stay as well as debridement of his stump if it continues to worsen.  Also advised patient that with lack of blood flow revision of the amputation may be required to an above-knee amputation.  Patient understood and is aware.  Recommend:  The patient has evidence of severe atherosclerotic changes of both lower extremities associated with ulceration and tissue loss of the foot.  This represents a limb threatening ischemia and places the patient at the risk for limb loss.  Patient should undergo angiography of the lower extremities with the hope for intervention for limb salvage.  The risks and benefits as well as the  alternative therapies was discussed in detail with the patient.  All questions were answered.  Patient agrees to proceed with angiography.  The patient will follow up with me in the office after the procedure.    2. Essential hypertension Continue antihypertensive medications as already ordered, these medications have been reviewed and there are no changes at this time.   3. Type 2 diabetes mellitus with diabetic peripheral angiopathy with gangrene, unspecified whether long term insulin use ([place]) Continue hypoglycemic medications as already ordered, these medications have been reviewed and there are no changes at this time.  Hgb A1C to be monitored as already arranged by primary service    Current Outpatient Medications on File Prior to Visit  Medication Sig Dispense Refill  . ACCU-CHEK AVIVA PLUS test strip U ONCE TO BID UTD 100 each 0  . ACCU-CHEK SOFTCLIX LANCETS lancets TEST 1-2 XD 100 each 0  . acetaminophen (TYLENOL) 325 MG tablet Take 1 tablet (325 mg total) by mouth every 6 (six) hours as needed for mild pain (or Fever >/= 101). 30 tablet 0  . apixaban (ELIQUIS) 5 MG TABS tablet Take 1 tablet (5 mg total) by mouth 2 (two) times daily. 60 tablet 0  . Blood Glucose Monitoring Suppl (ACCU-CHEK AVIVA PLUS) w/Device KIT U TO TEST ONCE TO BID 1 kit 1  . carvedilol (COREG) 3.125 MG tablet Take 1 tablet (3.125 mg total) by mouth 2 (two) times daily with a meal.    . Ensure Max Protein (ENSURE MAX PROTEIN) LIQD Take 330 mLs (11 oz total) by mouth 2 (two) times daily between meals.    . gabapentin (NEURONTIN) 100 MG capsule Take 1 capsule (100 mg total) by mouth 3 (three) times daily. 180 capsule 3  . insulin aspart (NOVOLOG) 100 UNIT/ML injection Inject 0-5 Units into the skin at bedtime. CBG < 70: implement hypoglycemia protocol CBG 70 - 120: 0 units CBG 121 - 150: 0 units CBG 151 - 200: 0 units CBG 201 - 250: 2 units CBG 251 - 300: 3 units CBG 301 - 350: 4 units CBG 351 - 400: 5  units CBG > 400: call MD 10 mL 11  .  insulin aspart (NOVOLOG) 100 UNIT/ML injection Inject 10 Units into the skin 3 (three) times daily with meals. 10 mL 11  . insulin glargine (LANTUS) 100 UNIT/ML injection Inject 0.19 mLs (19 Units total) into the skin daily. 10 mL 11  . Multiple Vitamin (MULTIVITAMIN WITH MINERALS) TABS tablet Take 1 tablet by mouth daily. 30 tablet 1  . Needles & Syringes MISC Use as directed 100 each 3  . pantoprazole (PROTONIX) 40 MG tablet Take 1 tablet (40 mg total) by mouth daily. 30 tablet 0  . rosuvastatin (CRESTOR) 40 MG tablet Take 1 tablet (40 mg total) by mouth daily at 6 PM.    . ticagrelor (BRILINTA) 90 MG TABS tablet Take 1 tablet (90 mg total) by mouth 2 (two) times daily. 60 tablet    No current facility-administered medications on file prior to visit.     There are no Patient Instructions on file for this visit. No follow-ups on file.   Kris Hartmann, NP  This note was completed with Sales executive.  Any errors are purely unintentional.

## 2019-06-27 LAB — SARS CORONAVIRUS 2 (TAT 6-24 HRS): SARS Coronavirus 2: NEGATIVE

## 2019-06-30 ENCOUNTER — Other Ambulatory Visit (INDEPENDENT_AMBULATORY_CARE_PROVIDER_SITE_OTHER): Payer: Self-pay | Admitting: Nurse Practitioner

## 2019-07-01 ENCOUNTER — Other Ambulatory Visit: Payer: Self-pay

## 2019-07-01 ENCOUNTER — Encounter
Admission: RE | Disposition: A | Discharge status: Home / Self Care | Payer: Self-pay | Source: Home / Self Care | Attending: Vascular Surgery

## 2019-07-01 ENCOUNTER — Ambulatory Visit
Admission: RE | Admit: 2019-07-01 | Discharge: 2019-07-01 | Disposition: A | Discharge status: Home / Self Care | Payer: Medicare Other | Attending: Vascular Surgery | Admitting: Vascular Surgery

## 2019-07-01 DIAGNOSIS — I509 Heart failure, unspecified: Secondary | ICD-10-CM | POA: Insufficient documentation

## 2019-07-01 DIAGNOSIS — E1151 Type 2 diabetes mellitus with diabetic peripheral angiopathy without gangrene: Secondary | ICD-10-CM | POA: Diagnosis not present

## 2019-07-01 DIAGNOSIS — I2510 Atherosclerotic heart disease of native coronary artery without angina pectoris: Secondary | ICD-10-CM | POA: Diagnosis not present

## 2019-07-01 DIAGNOSIS — E11622 Type 2 diabetes mellitus with other skin ulcer: Secondary | ICD-10-CM | POA: Diagnosis not present

## 2019-07-01 DIAGNOSIS — Z79899 Other long term (current) drug therapy: Secondary | ICD-10-CM | POA: Insufficient documentation

## 2019-07-01 DIAGNOSIS — I70223 Atherosclerosis of native arteries of extremities with rest pain, bilateral legs: Secondary | ICD-10-CM | POA: Diagnosis present

## 2019-07-01 DIAGNOSIS — Z794 Long term (current) use of insulin: Secondary | ICD-10-CM | POA: Diagnosis not present

## 2019-07-01 DIAGNOSIS — I110 Hypertensive heart disease with heart failure: Secondary | ICD-10-CM | POA: Diagnosis not present

## 2019-07-01 DIAGNOSIS — Z7901 Long term (current) use of anticoagulants: Secondary | ICD-10-CM | POA: Diagnosis not present

## 2019-07-01 DIAGNOSIS — F17210 Nicotine dependence, cigarettes, uncomplicated: Secondary | ICD-10-CM | POA: Diagnosis not present

## 2019-07-01 DIAGNOSIS — T8781 Dehiscence of amputation stump: Secondary | ICD-10-CM

## 2019-07-01 DIAGNOSIS — Z89512 Acquired absence of left leg below knee: Secondary | ICD-10-CM | POA: Insufficient documentation

## 2019-07-01 DIAGNOSIS — I4891 Unspecified atrial fibrillation: Secondary | ICD-10-CM | POA: Insufficient documentation

## 2019-07-01 DIAGNOSIS — E1140 Type 2 diabetes mellitus with diabetic neuropathy, unspecified: Secondary | ICD-10-CM | POA: Diagnosis not present

## 2019-07-01 DIAGNOSIS — L97929 Non-pressure chronic ulcer of unspecified part of left lower leg with unspecified severity: Secondary | ICD-10-CM | POA: Insufficient documentation

## 2019-07-01 DIAGNOSIS — I998 Other disorder of circulatory system: Secondary | ICD-10-CM

## 2019-07-01 HISTORY — PX: LOWER EXTREMITY ANGIOGRAPHY: CATH118251

## 2019-07-01 LAB — CREATININE, SERUM
Creatinine, Ser: 0.88 mg/dL (ref 0.61–1.24)
GFR calc Af Amer: >60 mL/min (ref >60)
GFR calc non Af Amer: >60 mL/min (ref >60)

## 2019-07-01 LAB — GLUCOSE, CAPILLARY
Glucose-Capillary: 349 mg/dL — ABNORMAL HIGH (ref 70–99)
Glucose-Capillary: 354 mg/dL — ABNORMAL HIGH (ref 70–99)

## 2019-07-01 LAB — BUN: BUN: 21 mg/dL (ref 8–23)

## 2019-07-01 SURGERY — LOWER EXTREMITY ANGIOGRAPHY
Anesthesia: Moderate Sedation | Site: Leg Lower | Laterality: Left

## 2019-07-01 MED ORDER — FENTANYL CITRATE (PF) 100 MCG/2ML IJ SOLN
INTRAMUSCULAR | Status: AC
  Filled 2019-07-01: qty 4

## 2019-07-01 MED ORDER — MIDAZOLAM HCL 2 MG/2ML IJ SOLN
INTRAMUSCULAR | Status: DC | PRN
  Administered 2019-07-01: 2 mg via INTRAVENOUS
  Administered 2019-07-01: 1 mg via INTRAVENOUS

## 2019-07-01 MED ORDER — METHYLPREDNISOLONE SODIUM SUCC 125 MG IJ SOLR: 125.0000 mg | Freq: Once | INTRAMUSCULAR | Status: DC | PRN

## 2019-07-01 MED ORDER — HYDROMORPHONE HCL 1 MG/ML IJ SOLN: 1.0000 mg | Freq: Once | INTRAMUSCULAR | Status: DC | PRN

## 2019-07-01 MED ORDER — ONDANSETRON HCL 4 MG/2ML IJ SOLN: 4.0000 mg | Freq: Four times a day (QID) | INTRAMUSCULAR | Status: DC | PRN

## 2019-07-01 MED ORDER — CEFAZOLIN SODIUM-DEXTROSE 2-4 GM/100ML-% IV SOLN
2.0000 g | Freq: Once | INTRAVENOUS | Status: AC
  Administered 2019-07-01: 09:00:00 2 g via INTRAVENOUS

## 2019-07-01 MED ORDER — DIPHENHYDRAMINE HCL 50 MG/ML IJ SOLN: 50.0000 mg | Freq: Once | INTRAMUSCULAR | Status: DC | PRN

## 2019-07-01 MED ORDER — FENTANYL CITRATE (PF) 100 MCG/2ML IJ SOLN
INTRAMUSCULAR | Status: DC | PRN
  Administered 2019-07-01: 25 ug via INTRAVENOUS
  Administered 2019-07-01: 50 ug via INTRAVENOUS

## 2019-07-01 MED ORDER — MIDAZOLAM HCL 2 MG/ML PO SYRP: 8.0000 mg | ORAL_SOLUTION | Freq: Once | ORAL | Status: DC | PRN

## 2019-07-01 MED ORDER — HEPARIN SODIUM (PORCINE) 1000 UNIT/ML IJ SOLN
INTRAMUSCULAR | Status: AC
  Filled 2019-07-01: qty 1

## 2019-07-01 MED ORDER — HEPARIN SODIUM (PORCINE) 1000 UNIT/ML IJ SOLN
INTRAMUSCULAR | Status: DC | PRN
  Administered 2019-07-01: 4000 [IU] via INTRAVENOUS

## 2019-07-01 MED ORDER — SODIUM CHLORIDE 0.9 % IV SOLN
INTRAVENOUS | Status: DC
  Administered 2019-07-01: 07:00:00 1000 mL via INTRAVENOUS

## 2019-07-01 MED ORDER — FAMOTIDINE 20 MG PO TABS: 40.0000 mg | ORAL_TABLET | Freq: Once | ORAL | Status: DC | PRN

## 2019-07-01 MED ORDER — IODIXANOL 320 MG/ML IV SOLN
INTRAVENOUS | Status: DC | PRN
  Administered 2019-07-01: 09:00:00 60 mL via INTRA_ARTERIAL

## 2019-07-01 MED ORDER — MIDAZOLAM HCL 5 MG/5ML IJ SOLN
INTRAMUSCULAR | Status: AC
  Filled 2019-07-01: qty 10

## 2019-07-01 SURGICAL SUPPLY — 20 items
BALLN LUTONIX 018 4X100X130 (BALLOONS) ×3
BALLN LUTONIX 018 5X40X130 (BALLOONS) ×3
BALLN ULTRVRSE 3X100X150 (BALLOONS) ×3
BALLOON LUTONIX 018 4X100X130 (BALLOONS) ×1 IMPLANT
BALLOON LUTONIX 018 5X40X130 (BALLOONS) ×1 IMPLANT
BALLOON ULTRVRSE 3X100X150 (BALLOONS) ×1 IMPLANT
CATH BEACON 5 .038 100 VERT TP (CATHETERS) ×3 IMPLANT
CATH PIG 70CM (CATHETERS) ×3 IMPLANT
DEVICE PRESTO INFLATION (MISCELLANEOUS) ×3 IMPLANT
DEVICE STARCLOSE SE CLOSURE (Vascular Products) ×3 IMPLANT
GLIDEWIRE ADV .035X260CM (WIRE) ×3 IMPLANT
GUIDEWIRE SUPER STIFF .035X180 (WIRE) ×3 IMPLANT
PACK ANGIOGRAPHY (CUSTOM PROCEDURE TRAY) ×3 IMPLANT
SHEATH ANL2 6FRX45 HC (SHEATH) ×3 IMPLANT
SHEATH BRITE TIP 4FRX11 (SHEATH) ×3 IMPLANT
SHEATH BRITE TIP 5FRX11 (SHEATH) ×3 IMPLANT
SYR MEDRAD MARK 7 150ML (SYRINGE) ×3 IMPLANT
TUBING CONTRAST HIGH PRESS 72 (TUBING) ×3 IMPLANT
WIRE G V18X300CM (WIRE) ×3 IMPLANT
WIRE J 3MM .035X145CM (WIRE) ×3 IMPLANT

## 2019-07-01 NOTE — Op Note (Signed)
[name]  Percutaneous Study/Intervention Procedural Note   Date of Surgery: 07/01/2019  Surgeon(s):DEW,JASON    Assistants:none  Pre-operative Diagnosis: PAD with ulceration and stump breakdown of the left BKA  Post-operative diagnosis:  Same  Procedure(s) Performed:             1.  Ultrasound guidance for vascular access right femoral artery             2.  Catheter placement into left common femoral artery from right femoral approach             3.  Aortogram and selective left lower extremity angiogram             4.  Percutaneous transluminal angioplasty of the left distal profunda femoris artery in the mid thigh with 3 mm diameter conventional and 4 mm diameter Lutonix drug-coated angioplasty balloons             5.   Percutaneous transluminal angioplasty of the proximal left profunda femoris artery with 5 mm diameter by 4 cm length Lutonix drug-coated angioplasty balloon  6.   StarClose closure device right femoral artery  EBL: 3 cc  Contrast: 60 cc  Fluoro Time: 3.9 minutes  Moderate Conscious Sedation Time: approximately 30 minutes using 3 mg of Versed and 75 Mcg of Fentanyl              Indications:  Patient is a 63 y.o.male with wound and stump breakdown of the left below-knee amputation. The patient has noninvasive study showing poor flow to the stump. The patient is brought in for angiography for further evaluation and potential treatment.  Due to the limb threatening nature of the situation, angiogram was performed for attempted limb salvage. The patient is aware that if the procedure fails, amputation would be expected.  The patient also understands that even with successful revascularization, amputation may still be required due to the severity of the situation.  Risks and benefits are discussed and informed consent is obtained.   Procedure:  The patient was identified and appropriate procedural time out was performed.  The patient was then  placed supine on the table and prepped and draped in the usual sterile fashion. Moderate conscious sedation was administered during a face to face encounter with the patient throughout the procedure with my supervision of the RN administering medicines and monitoring the patient's vital signs, pulse oximetry, telemetry and mental status throughout from the start of the procedure until the patient was taken to the recovery room. Ultrasound was used to evaluate the right common femoral artery.  It was patent .  A digital ultrasound image was acquired.  A Seldinger needle was used to access the right common femoral artery under direct ultrasound guidance and a permanent image was performed.  A 0.035 J wire was advanced without resistance and a 5Fr sheath was placed.  Pigtail catheter was placed into the aorta and an AP aortogram was performed. This demonstrated normal renal arteries.  The aorta was fairly normal.  The iliac arteries had some calcific plaque and mild stenosis more on the left than the right but nothing that appeared to be more than 30 to 35%. I then crossed the aortic bifurcation and advanced to the left femoral head. Selective left lower extremity angiogram was then performed. This demonstrated a patent left common femoral artery endarterectomy.  The most proximal portion of the profunda femoris artery with the endarterectomy was patent, but just below the endarterectomy site  was a hyperplastic lesion in the 50 to 60% range.  After 2 smaller branches the profunda femoris artery, the main profunda femoris artery was then heavily diseased in the mid thigh with a greater than 90% stenosis.  The SFA had the expected flush occlusion. It was felt that it was in the patient's best interest to proceed with intervention after these images to avoid a second procedure and a larger amount of contrast and fluoroscopy based off of the findings from the initial angiogram. The patient was systemically heparinized and  a 6 [place] Ansell sheath was then placed over the [employer] wire. I then used a Kumpe catheter and the advantage wire to navigate across the proximal profunda femoris lesion and down into the mid profunda femoris artery.  I then exchanged for a 0.018 wire and cross the lesion in the mid thigh without difficulty and used selective injection to evaluate the more distal perfusion.  The diagnostic catheter was then removed after we proved we were intraluminal and there was reasonable flow beyond this to support an angioplasty.  A 3 mm diameter by 10 cm length angioplasty balloon was inflated to 10 atm in the distal left profunda femoris artery.  I then upsized to a 4 mm diameter by 10 cm likely tonics drug-coated angioplasty balloon and gently inflated this to 6 atm for 1 minute.  Completion imaging showed about a 30 to 40% residual stenosis with the flow appeared improved.  I then addressed the more proximal profunda femoris lesion just below the endarterectomy with a 5 mm diameter by 4 cm length Lutonix drug-coated angioplasty balloon inflated to 10 atm for 1 minute.  Following angioplasty of the proximal lesion, there was only about a 10 to 15% residual stenosis. I elected to terminate the procedure. The sheath was removed and StarClose closure device was deployed in the right femoral artery with excellent hemostatic result. The patient was taken to the recovery room in stable condition having tolerated the procedure well.  Findings:               Aortogram:  Normal renal arteries.  The aorta was fairly normal.  The iliac arteries had some calcific plaque and mild stenosis more on the left than the right but nothing that appeared to be more than 30 to 35%.             Left lower Extremity:  This demonstrated a patent left common femoral artery endarterectomy.  The most proximal portion of the profunda femoris artery with the endarterectomy was patent, but just below the endarterectomy site was a  hyperplastic lesion in the 50 to 60% range.  After 2 smaller branches the profunda femoris artery, the main profunda femoris artery was then heavily diseased in the mid thigh with a greater than 90% stenosis.  The SFA had the expected flush occlusion   Disposition: Patient was taken to the recovery room in stable condition having tolerated the procedure well.  Complications: None  Richard [name] 07/01/2019 9:22 AM   This note was created with Dragon Medical transcription system. Any errors in dictation are purely unintentional.

## 2019-07-01 NOTE — H&P (Signed)
[facility] History & Physical Update  The patient was interviewed and re-examined.  The patient's previous History and Physical has been reviewed and is unchanged.  There is no change in the plan of care. We plan to proceed with the scheduled procedure.  Leotis Pain, MD  07/01/2019, 8:12 AM

## 2019-07-15 ENCOUNTER — Ambulatory Visit (INDEPENDENT_AMBULATORY_CARE_PROVIDER_SITE_OTHER): Payer: Medicare Other | Admitting: Nurse Practitioner

## 2019-07-15 ENCOUNTER — Other Ambulatory Visit: Payer: Self-pay

## 2019-07-15 ENCOUNTER — Encounter (INDEPENDENT_AMBULATORY_CARE_PROVIDER_SITE_OTHER): Payer: Self-pay | Admitting: Nurse Practitioner

## 2019-07-15 ENCOUNTER — Encounter (INDEPENDENT_AMBULATORY_CARE_PROVIDER_SITE_OTHER): Payer: Self-pay

## 2019-07-15 ENCOUNTER — Other Ambulatory Visit (INDEPENDENT_AMBULATORY_CARE_PROVIDER_SITE_OTHER): Payer: Medicare Other

## 2019-07-15 ENCOUNTER — Other Ambulatory Visit (INDEPENDENT_AMBULATORY_CARE_PROVIDER_SITE_OTHER): Payer: Self-pay | Admitting: Nurse Practitioner

## 2019-07-15 VITALS — BP 144/79 | HR 80 | Resp 16

## 2019-07-15 DIAGNOSIS — M79604 Pain in right leg: Secondary | ICD-10-CM | POA: Diagnosis not present

## 2019-07-15 DIAGNOSIS — I739 Peripheral vascular disease, unspecified: Secondary | ICD-10-CM | POA: Diagnosis not present

## 2019-07-15 DIAGNOSIS — I10 Essential (primary) hypertension: Secondary | ICD-10-CM

## 2019-07-15 DIAGNOSIS — F17200 Nicotine dependence, unspecified, uncomplicated: Secondary | ICD-10-CM

## 2019-07-15 DIAGNOSIS — L8990 Pressure ulcer of unspecified site, unspecified stage: Secondary | ICD-10-CM

## 2019-07-15 DIAGNOSIS — E1152 Type 2 diabetes mellitus with diabetic peripheral angiopathy with gangrene: Secondary | ICD-10-CM | POA: Diagnosis not present

## 2019-07-15 MED ORDER — DOXYCYCLINE HYCLATE 100 MG PO CAPS: 100.0000 mg | ORAL_CAPSULE | Freq: Two times a day (BID) | ORAL | 0 refills | Status: DC

## 2019-07-15 MED ORDER — OXYCODONE-ACETAMINOPHEN 5-325 MG PO TABS: 1.0000 | ORAL_TABLET | ORAL | 0 refills | Status: DC | PRN

## 2019-07-15 NOTE — Progress Notes (Signed)
SUBJECTIVE:  Patient ID: Richard [name], male    DOB: [birth year], 63 y.o.   MRN: [id number] Chief Complaint  Patient presents with  . Follow-up    [facility] 2week post angio    HPI  Richard [name] is a 63 y.o. male the presents today after angiogram on his left below-knee amputation revealed diseased profunda which was causing the patient's issues with his stump.  Today the stump is warm however the patient continues to have significant pain.  The stump is also swollen today and the previous ulceration that was on the medial portion of the stump has worsened and is now oozing.  The stump is also weeping somewhat.  The patient also complains of pain to the right lower extremity.  He states that the pain happens when he walks as well as when he is laying down.  He denies any fever, chills, nausea, vomiting or diarrhea.  He denies any chest pain or shortness of breath.  Right arterial duplex reveals moderate atherosclerosis throughout.  The patient has biphasic flow throughout.  There is a partially occluded anterior tibial artery with collateral flow seen.  Past Medical History:  Diagnosis Date  . Atrial fibrillation ([place])    per patient  . Cancer ([place])   . CHF (congestive heart failure) ([place])   . Chronic back pain   . Chronic leg pain   . Coronary artery disease   . Diabetes mellitus without complication ([place])   . Hypertension   . Neuropathy     Past Surgical History:  Procedure Laterality Date  . AMPUTATION Left 03/13/2019   Procedure: AMPUTATION BELOW KNEE;  Surgeon: Algernon Huxley, MD;  Location: [facility] ORS;  Service: Vascular;  Laterality: Left;  . CORONARY ANGIOPLASTY WITH STENT PLACEMENT    . EMBOLECTOMY Left 03/12/2019   Procedure: EMBOLECTOMY SFA POPLITEAL;  Surgeon: Algernon Huxley, MD;  Location: [facility] ORS;  Service: Vascular;  Laterality: Left;  . ENDARTERECTOMY FEMORAL Left 03/12/2019   Procedure: ENDARTERECTOMY FEMORAL;  Surgeon: Algernon Huxley, MD;  Location: [facility] ORS;  Service:  Vascular;  Laterality: Left;  . ESOPHAGOGASTRODUODENOSCOPY N/A 09/11/2017   Procedure: ESOPHAGOGASTRODUODENOSCOPY (EGD);  Surgeon: Virgel Manifold, MD;  Location: [facility];  Service: Endoscopy;  Laterality: N/A;  . LOWER EXTREMITY ANGIOGRAPHY Left 11/17/2018   Procedure: Lower Extremity Angiography, with possible intervention;  Surgeon: Algernon Huxley, MD;  Location: [place] [facility];  Service: Cardiovascular;  Laterality: Left;  . LOWER EXTREMITY ANGIOGRAPHY Left 03/04/2019   Procedure: Lower Extremity Angiography;  Surgeon: Algernon Huxley, MD;  Location: [place] [facility];  Service: Cardiovascular;  Laterality: Left;  . LOWER EXTREMITY ANGIOGRAPHY Left 07/01/2019   Procedure: LOWER EXTREMITY ANGIOGRAPHY;  Surgeon: Algernon Huxley, MD;  Location: [place] [facility];  Service: Cardiovascular;  Laterality: Left;  . pancreas removed      Social History   Socioeconomic History  . Marital status: Single    Spouse name: Not on file  . Number of children: Not on file  . Years of education: Not on file  . Highest education level: Not on file  Occupational History  . Not on file  Social Needs  . Financial resource strain: Not on file  . Food insecurity    Worry: Not on file    Inability: Not on file  . Transportation needs    Medical: Not on file    Non-medical: Not on file  Tobacco Use  . Smoking status: Current Every Day Smoker  Packs/day: 0.50  . Smokeless tobacco: Never Used  Substance and Sexual Activity  . Alcohol use: No    Alcohol/week: 0.0 standard drinks  . 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 Cocaine used over a year ago per pt.  . Sexual activity: Not on file  Lifestyle  . Physical activity    Days per week: Not on file    Minutes per session: Not on file  . Stress: Not on file  Relationships  . Social Herbalist on phone: Not on file    Gets together: Not on file    Attends religious service: Not on file     Active member of club or organization: Not on file    Attends meetings of clubs or organizations: Not on file    Relationship status: Not on file  . Intimate partner violence    Fear of current or ex partner: Not on file    Emotionally abused: Not on file    Physically abused: Not on file    Forced sexual activity: Not on file  Other Topics Concern  . Not on file  Social History Narrative  . Not on file    Family History  Problem Relation Age of Onset  . CAD Mother   . Diabetes Mellitus II Mother   . Diabetes Mellitus II Father     No Known Allergies   Review of Systems   Review of Systems: Negative Unless Checked Constitutional: []Weight loss  []Fever  []Chills Cardiac: []Chest pain   [x] Atrial Fibrillation  []Palpitations   []Shortness of breath when laying flat   []Shortness of breath with exertion. []Shortness of breath at rest Vascular:  []Pain in legs with walking   []Pain in legs with standing []Pain in legs when laying flat   []Claudication    []Pain in feet when laying flat    []History of DVT   []Phlebitis   [x]Swelling in legs   []Varicose veins   []Non-healing ulcers Pulmonary:   []Uses home oxygen   []Productive cough   []Hemoptysis   []Wheeze  []COPD   []Asthma Neurologic:  []Dizziness   []Seizures  []Blackouts []History of stroke   []History of TIA  []Aphasia   []Temporary Blindness   []Weakness or numbness in arm   []Weakness or numbness in leg Musculoskeletal:   []Joint swelling   []Joint pain   []Low back pain  [] History of Knee Replacement []Arthritis []back Surgeries  [] Spinal Stenosis    Hematologic:  []Easy bruising  []Easy bleeding   []Hypercoagulable state   []Anemic Gastrointestinal:  []Diarrhea   []Vomiting  []Gastroesophageal reflux/heartburn   []Difficulty swallowing. []Abdominal pain Genitourinary:  []Chronic kidney disease   []Difficult urination  []Anuric   []Blood in urine []Frequent urination  []Burning with urination   []Hematuria Skin:   []Rashes   []Ulcers [x]Wounds Psychological:  []History of anxiety   [] History of major depression  [] Memory Difficulties      OBJECTIVE:   Physical Exam  BP (!) 144/79 (BP Location: Right Arm)   Pulse 80   Resp 16   Gen: WD/WN, NAD Head: [place]/AT, No temporalis wasting.  Ear/Nose/Throat: Hearing grossly intact, nares w/o erythema or drainage Eyes: PER, EOMI, sclera nonicteric.  Neck: Supple, no masses.  No JVD.  Pulmonary:  Good air movement, no use of accessory muscles.  Cardiac: RRR Vascular:  Transmetatarsal amputation on the right, left  above-knee amputation.  Posterior tibial pulses not palpable on right.  Ulcerated area on left medial portion of stump Vessel Right Left  Radial Palpable Palpable   Gastrointestinal: soft, non-distended. No guarding/no peritoneal signs.  Musculoskeletal: M/S 5/5 throughout.  No deformity or atrophy.  Neurologic: Pain and light touch intact in extremities.  Symmetrical.  Speech is fluent. Motor exam as listed above. Psychiatric: Judgment intact, Mood & affect appropriate for pt's clinical situation. Dermatologic: No Venous rashes. No Ulcers Noted.  No changes consistent with cellulitis. Lymph : No Cervical lymphadenopathy, no lichenification or skin changes of chronic lymphedema.       ASSESSMENT AND PLAN:  1. PAD (peripheral artery disease) ([place]) The patient's right lower extremity pain may be due to lower back issues.  Right lower extremity arterial duplex today revealed biphasic waveforms throughout the leg except for the distal anterior tibial artery which was monophasic.  The patient stump today was warm however swollen and weeping.  See below for stump plan.  2. Essential hypertension Continue antihypertensive medications as already ordered, these medications have been reviewed and there are no changes at this time.   3. Type 2 diabetes mellitus with diabetic peripheral angiopathy with gangrene, unspecified whether long term insulin  use ([place]) Continue hypoglycemic medications as already ordered, these medications have been reviewed and there are no changes at this time.  Hgb A1C to be monitored as already arranged by primary service   4. Pressure injury of skin, unspecified injury stage, unspecified location There is some concern that the pressure injury is showing some signs and symptoms of infection such as copious drainage and erythema.  We will do a course of doxycycline to treat this.  I have also instructed the patient to utilize some triple antibacterial ointment with gauze and padding over the wound.  Today we also placed a co-band around the wound to help with some of the edema that the patient is experiencing which is likely also another factor worsening the wound.  We will bring the patient back in 3 to 4 weeks to evaluate the lower extremity wound.  5. Tobacco use disorder, severe, dependence Smoking cessation was discussed, 3-10 minutes spent on this topic specifically    Current Outpatient Medications on File Prior to Visit  Medication Sig Dispense Refill  . ACCU-CHEK AVIVA PLUS test strip U ONCE TO BID UTD 100 each 0  . ACCU-CHEK SOFTCLIX LANCETS lancets TEST 1-2 XD 100 each 0  . acetaminophen (TYLENOL) 325 MG tablet Take 1 tablet (325 mg total) by mouth every 6 (six) hours as needed for mild pain (or Fever >/= 101). 30 tablet 0  . apixaban (ELIQUIS) 5 MG TABS tablet Take 1 tablet (5 mg total) by mouth 2 (two) times daily. 60 tablet 0  . Blood Glucose Monitoring Suppl (ACCU-CHEK AVIVA PLUS) w/Device KIT U TO TEST ONCE TO BID 1 kit 1  . carvedilol (COREG) 3.125 MG tablet Take 1 tablet (3.125 mg total) by mouth 2 (two) times daily with a meal.    . Ensure Max Protein (ENSURE MAX PROTEIN) LIQD Take 330 mLs (11 oz total) by mouth 2 (two) times daily between meals.    . gabapentin (NEURONTIN) 100 MG capsule Take 1 capsule (100 mg total) by mouth 3 (three) times daily. 180 capsule 3  . insulin aspart (NOVOLOG)  100 UNIT/ML injection Inject 0-5 Units into the skin at bedtime. CBG < 70: implement hypoglycemia protocol CBG 70 - 120: 0 units CBG 121 - 150: 0  units CBG 151 - 200: 0 units CBG 201 - 250: 2 units CBG 251 - 300: 3 units CBG 301 - 350: 4 units CBG 351 - 400: 5 units CBG > 400: call MD 10 mL 11  . insulin aspart (NOVOLOG) 100 UNIT/ML injection Inject 10 Units into the skin 3 (three) times daily with meals. 10 mL 11  . insulin glargine (LANTUS) 100 UNIT/ML injection Inject 0.19 mLs (19 Units total) into the skin daily. 10 mL 11  . Multiple Vitamin (MULTIVITAMIN WITH MINERALS) TABS tablet Take 1 tablet by mouth daily. 30 tablet 1  . Needles & Syringes MISC Use as directed 100 each 3  . oxyCODONE-acetaminophen (PERCOCET/ROXICET) 5-325 MG tablet Take 1-2 tablets by mouth every 4 (four) hours as needed for severe pain. 50 tablet 0  . pantoprazole (PROTONIX) 40 MG tablet Take 1 tablet (40 mg total) by mouth daily. 30 tablet 0  . rosuvastatin (CRESTOR) 40 MG tablet Take 1 tablet (40 mg total) by mouth daily at 6 PM.    . ticagrelor (BRILINTA) 90 MG TABS tablet Take 1 tablet (90 mg total) by mouth 2 (two) times daily. 60 tablet    No current facility-administered medications on file prior to visit.     There are no Patient Instructions on file for this visit. No follow-ups on file.   Kris Hartmann, NP  This note was completed with Sales executive.  Any errors are purely unintentional.

## 2019-08-04 ENCOUNTER — Encounter (INDEPENDENT_AMBULATORY_CARE_PROVIDER_SITE_OTHER): Payer: Medicare Other

## 2019-08-04 ENCOUNTER — Ambulatory Visit (INDEPENDENT_AMBULATORY_CARE_PROVIDER_SITE_OTHER): Payer: Medicare Other | Admitting: Vascular Surgery

## 2019-08-11 ENCOUNTER — Encounter (INDEPENDENT_AMBULATORY_CARE_PROVIDER_SITE_OTHER): Payer: Self-pay | Admitting: Vascular Surgery

## 2019-08-11 ENCOUNTER — Other Ambulatory Visit: Payer: Self-pay

## 2019-08-11 ENCOUNTER — Ambulatory Visit (INDEPENDENT_AMBULATORY_CARE_PROVIDER_SITE_OTHER): Payer: Medicare Other | Admitting: Vascular Surgery

## 2019-08-11 VITALS — BP 131/67 | HR 96 | Resp 16

## 2019-08-11 DIAGNOSIS — S88112A Complete traumatic amputation at level between knee and ankle, left lower leg, initial encounter: Secondary | ICD-10-CM

## 2019-08-11 DIAGNOSIS — I10 Essential (primary) hypertension: Secondary | ICD-10-CM

## 2019-08-11 DIAGNOSIS — I739 Peripheral vascular disease, unspecified: Secondary | ICD-10-CM

## 2019-08-11 DIAGNOSIS — I4891 Unspecified atrial fibrillation: Secondary | ICD-10-CM | POA: Diagnosis not present

## 2019-08-11 DIAGNOSIS — Z794 Long term (current) use of insulin: Secondary | ICD-10-CM

## 2019-08-11 DIAGNOSIS — E119 Type 2 diabetes mellitus without complications: Secondary | ICD-10-CM | POA: Diagnosis not present

## 2019-08-11 NOTE — Progress Notes (Signed)
MRN : [id number]  Richard [name] is a 63 y.o. (Nov 11, 1955) male who presents with chief complaint of  Chief Complaint  Patient presents with   Follow-up  .  History of Present Illness: Patient returns today in follow up of his PAD.  After his intervention about 6 weeks ago, his left stump has almost entirely healed and only has a minimal scab on the medial aspect.  He still has a lot of pain in the area and asks about getting more pain medication today.  No fevers or chills or signs of infection.  Access site is well-healed and he had no periprocedural complications  Current Outpatient Medications  Medication Sig Dispense Refill   ACCU-CHEK AVIVA PLUS test strip U ONCE TO BID UTD 100 each 0   ACCU-CHEK SOFTCLIX LANCETS lancets TEST 1-2 XD 100 each 0   acetaminophen (TYLENOL) 325 MG tablet Take 1 tablet (325 mg total) by mouth every 6 (six) hours as needed for mild pain (or Fever >/= 101). 30 tablet 0   apixaban (ELIQUIS) 5 MG TABS tablet Take 1 tablet (5 mg total) by mouth 2 (two) times daily. 60 tablet 0   Blood Glucose Monitoring Suppl (ACCU-CHEK AVIVA PLUS) w/Device KIT U TO TEST ONCE TO BID 1 kit 1   carvedilol (COREG) 3.125 MG tablet Take 1 tablet (3.125 mg total) by mouth 2 (two) times daily with a meal.     chlorhexidine (PERIDEX) 0.12 % solution      Ensure Max Protein (ENSURE MAX PROTEIN) LIQD Take 330 mLs (11 oz total) by mouth 2 (two) times daily between meals.     gabapentin (NEURONTIN) 100 MG capsule Take 1 capsule (100 mg total) by mouth 3 (three) times daily. 180 capsule 3   insulin aspart (NOVOLOG) 100 UNIT/ML injection Inject 0-5 Units into the skin at bedtime. CBG < 70: implement hypoglycemia protocol CBG 70 - 120: 0 units CBG 121 - 150: 0 units CBG 151 - 200: 0 units CBG 201 - 250: 2 units CBG 251 - 300: 3 units CBG 301 - 350: 4 units CBG 351 - 400: 5 units CBG > 400: call MD 10 mL 11   insulin aspart (NOVOLOG) 100 UNIT/ML injection Inject 10 Units into  the skin 3 (three) times daily with meals. 10 mL 11   insulin glargine (LANTUS) 100 UNIT/ML injection Inject 0.19 mLs (19 Units total) into the skin daily. 10 mL 11   Multiple Vitamin (MULTIVITAMIN WITH MINERALS) TABS tablet Take 1 tablet by mouth daily. 30 tablet 1   Needles & Syringes MISC Use as directed 100 each 3   oxyCODONE-acetaminophen (PERCOCET/ROXICET) 5-325 MG tablet Take 1-2 tablets by mouth every 4 (four) hours as needed for severe pain. 40 tablet 0   pantoprazole (PROTONIX) 40 MG tablet Take 1 tablet (40 mg total) by mouth daily. 30 tablet 0   rosuvastatin (CRESTOR) 40 MG tablet Take 1 tablet (40 mg total) by mouth daily at 6 PM.     ticagrelor (BRILINTA) 90 MG TABS tablet Take 1 tablet (90 mg total) by mouth 2 (two) times daily. 60 tablet    doxycycline (VIBRAMYCIN) 100 MG capsule Take 1 capsule (100 mg total) by mouth 2 (two) times daily. (Patient not taking: Reported on 08/11/2019) 20 capsule 0   TRUEPLUS PEN NEEDLES 31G X 6 MM MISC      No current facility-administered medications for this visit.     Past Medical History:  Diagnosis Date   Atrial fibrillation ([place])  per patient   Cancer [facility])    CHF (congestive heart failure) ([facility])    Chronic back pain    Chronic leg pain    Coronary artery disease    Diabetes mellitus without complication ([place])    Hypertension    Neuropathy     Past Surgical History:  Procedure Laterality Date   AMPUTATION Left 03/13/2019   Procedure: AMPUTATION BELOW KNEE;  Surgeon: Algernon Huxley, MD;  Location: [facility] ORS;  Service: Vascular;  Laterality: Left;   CORONARY ANGIOPLASTY WITH STENT PLACEMENT     EMBOLECTOMY Left 03/12/2019   Procedure: EMBOLECTOMY SFA POPLITEAL;  Surgeon: Algernon Huxley, MD;  Location: [facility] ORS;  Service: Vascular;  Laterality: Left;   ENDARTERECTOMY FEMORAL Left 03/12/2019   Procedure: ENDARTERECTOMY FEMORAL;  Surgeon: Algernon Huxley, MD;  Location: [facility] ORS;  Service: Vascular;  Laterality: Left;     ESOPHAGOGASTRODUODENOSCOPY N/A 09/11/2017   Procedure: ESOPHAGOGASTRODUODENOSCOPY (EGD);  Surgeon: Virgel Manifold, MD;  Location: [facility] ENDOSCOPY;  Service: Endoscopy;  Laterality: N/A;   LOWER EXTREMITY ANGIOGRAPHY Left 11/17/2018   Procedure: Lower Extremity Angiography, with possible intervention;  Surgeon: Algernon Huxley, MD;  Location: [place] [facility] LAB;  Service: Cardiovascular;  Laterality: Left;   LOWER EXTREMITY ANGIOGRAPHY Left 03/04/2019   Procedure: Lower Extremity Angiography;  Surgeon: Algernon Huxley, MD;  Location: [place] [facility];  Service: Cardiovascular;  Laterality: Left;   LOWER EXTREMITY ANGIOGRAPHY Left 07/01/2019   Procedure: LOWER EXTREMITY ANGIOGRAPHY;  Surgeon: Algernon Huxley, MD;  Location: [place] [facility];  Service: Cardiovascular;  Laterality: Left;   pancreas removed      Social History Social History   Tobacco Use   Smoking status: Current Every Day Smoker    Packs/day: 0.50   Smokeless tobacco: Never Used  Substance Use Topics   Alcohol use: No    Alcohol/week: 0.0 standard drinks   Drug use: Yes    Types: 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 Cocaine used over a year ago per pt.     Family History Family History  Problem Relation Age of Onset   CAD Mother    Diabetes Mellitus II Mother    Diabetes Mellitus II Father     No Known Allergies   REVIEW OF SYSTEMS (Negative unless checked)  Constitutional: [] Weight loss  [] Fever  [] Chills Cardiac: [] Chest pain   [] Chest pressure   [] Palpitations   [] Shortness of breath when laying flat   [] Shortness of breath at rest   [] Shortness of breath with exertion. Vascular:  [x] Pain in legs with walking   [x] Pain in legs at rest   [] Pain in legs when laying flat   [] Claudication   [] Pain in feet when walking  [] Pain in feet at rest  [] Pain in feet when laying flat   [] History of DVT   [] Phlebitis   [] Swelling in legs   [] Varicose veins   [] Non-healing ulcers Pulmonary:    [] Uses home oxygen   [] Productive cough   [] Hemoptysis   [] Wheeze  [] COPD   [] Asthma Neurologic:  [] Dizziness  [] Blackouts   [] Seizures   [] History of stroke   [] History of TIA  [] Aphasia   [] Temporary blindness   [] Dysphagia   [] Weakness or numbness in arms   [] Weakness or numbness in legs Musculoskeletal:  [x] Arthritis   [] Joint swelling   [x] Joint pain   [] Low back pain Hematologic:  [] Easy bruising  [] Easy bleeding   [] Hypercoagulable state   [x] Anemic  Gastrointestinal:  [] Blood in stool   [] Vomiting blood  [] Gastroesophageal reflux/heartburn   [] Abdominal pain Genitourinary:  [] Chronic kidney disease   [] Difficult urination  [] Frequent urination  [] Burning with urination   [] Hematuria Skin:  [] Rashes   [] Ulcers   [] Wounds Psychological:  [] History of anxiety   []  History of major depression.  Physical Examination  BP 131/67 (BP Location: Right Arm)    Pulse 96    Resp 16  Gen:  WD/WN, NAD Head: [place]/AT, No temporalis wasting. Ear/Nose/Throat: Hearing grossly intact, nares w/o erythema or drainage Eyes: Conjunctiva clear. Sclera non-icteric Neck: Supple.  Trachea midline Pulmonary:  Good air movement, no use of accessory muscles.  Cardiac: RRR, no JVD Vascular:  Vessel Right Left  Radial Palpable Palpable                          PT  1+ palpable  not palpable  DP  1+ palpable  not palpable    Musculoskeletal: M/S 5/5 throughout.  No deformity or atrophy.  Left BKA with only a small scab on the medial aspect of the wound.  Wound is otherwise healed.  He has his prosthesis today.  No edema. Neurologic: Sensation grossly intact in extremities.  Symmetrical.  Speech is fluent.  Psychiatric: Judgment intact, Mood & affect appropriate for pt's clinical situation. Dermatologic: Left BKA stump as above       Labs Recent Results (from the past 2160 hour(s))  SARS CORONAVIRUS 2 (TAT 6-24 HRS) Nasopharyngeal Nasopharyngeal Swab     Status: None   Collection Time: 06/26/19  3:04  PM   Specimen: Nasopharyngeal Swab  Result Value Ref Range   SARS Coronavirus 2 NEGATIVE NEGATIVE    Comment: (NOTE) SARS-CoV-2 target nucleic acids are NOT DETECTED. The SARS-CoV-2 RNA is generally detectable in upper and lower respiratory specimens during the acute phase of infection. Negative results do not preclude SARS-CoV-2 infection, do not rule out co-infections with other pathogens, and should not be used as the sole basis for treatment or other patient management decisions. Negative results must be combined with clinical observations, patient history, and epidemiological information. The expected result is Negative. Fact Sheet for Patients: SugarRoll.be Fact Sheet for Healthcare Providers: https://www.woods-mathews.com/ This test is not yet approved or cleared by the [place] FDA and  has been authorized for detection and/or diagnosis of SARS-CoV-2 by FDA under an Emergency Use Authorization (EUA). This EUA will remain  in effect (meaning this test can be used) for the duration of the COVID-19 declaration under Section 56 4(b)(1) of the Act, 21 U.S.C. section 360bbb-3(b)(1), unless the authorization is terminated or revoked sooner. Performed at [facility], [place] [address], [place], [place] [address]   BUN     Status: None   Collection Time: 07/01/19  7:26 AM  Result Value Ref Range   BUN 21 8 - 23 mg/dL    Comment: Performed at [facility], [address] [place], [place] [address]  Creatinine, serum     Status: None   Collection Time: 07/01/19  7:26 AM  Result Value Ref Range   Creatinine, Ser 0.88 0.61 - 1.24 mg/dL   GFR calc non Af Amer >60 >60 mL/min   GFR calc Af Amer >60 >60 mL/min    Comment: Performed at [facility], [address] [place], [place] [address]  Glucose, capillary     Status: Abnormal   Collection Time: 07/01/19  7:26 AM  Result Value Ref Range   Glucose-Capillary  349 (H) 70 - 99 mg/dL  Glucose, capillary     Status: Abnormal   Collection Time: 07/01/19  9:01 AM  Result Value Ref Range   Glucose-Capillary 354 (H) 70 - 99 mg/dL    Radiology Vas [place] Lower Extremity Arterial Duplex  Result Date: 07/17/2019 LOWER EXTREMITY ARTERIAL DUPLEX STUDY Indications: Peripheral artery disease.  Vascular Interventions: 03/13/2019: Left Below knee Amputation. Current ABI:            not done Limitations: right foot pain Performing Technologist: Concha Norway RVT  Examination Guidelines: A complete evaluation includes B-mode imaging, spectral Doppler, color Doppler, and power Doppler as needed of all accessible portions of each vessel. Bilateral testing is considered an integral part of a complete examination. Limited examinations for reoccurring indications may be performed as noted.  +----------+--------+-----+--------+----------+--------+  RIGHT      PSV cm/s Ratio Stenosis Waveform   Comments  +----------+--------+-----+--------+----------+--------+  CFA Mid    104                     biphasic             +----------+--------+-----+--------+----------+--------+  DFA        88                      biphasic             +----------+--------+-----+--------+----------+--------+  SFA Prox   95                      biphasic             +----------+--------+-----+--------+----------+--------+  SFA Mid    134                     biphasic             +----------+--------+-----+--------+----------+--------+  SFA Distal 140                     biphasic             +----------+--------+-----+--------+----------+--------+  POP Distal 60                      biphasic             +----------+--------+-----+--------+----------+--------+  ATA Distal 68                      monophasic           +----------+--------+-----+--------+----------+--------+  PTA Distal 64                      biphasic             +----------+--------+-----+--------+----------+--------+  Summary: Right: Moderate  atherosclerosis throughout. Biphasic flow throughout. Partially occluded ATA with collat flow seen.  See table(s) above for measurements and observations. Electronically signed by Leotis Pain MD on 07/17/2019 at 6:31:49 PM.    Final     Assessment/Plan  Insulin dependent type 2 diabetes mellitus ([place]) blood glucose control important in reducing the progression of atherosclerotic disease. Also, involved in wound healing. On appropriate medications.   A-fib ([place]) Rate is reasonably controlled and he is on anticoagulation  HTN (hypertension) blood pressure control important in reducing the progression of atherosclerotic disease. On appropriate oral medications.   Below-knee amputation of left lower extremity ([place]) Wound now  healing again once we performed intervention about 6 weeks ago.  Almost completely healed at this point.  Continue to use prosthesis  PAD (peripheral artery disease) ([facility]) We will have him return in 3 to 6 months with noninvasive studies to evaluate his perfusion.  Contact our office with any problems in the interim.    Leotis Pain, MD  08/11/2019 2:32 PM    This note was created with Dragon medical transcription system.  Any errors from dictation are purely unintentional

## 2019-08-11 NOTE — Assessment & Plan Note (Signed)
blood glucose control important in reducing the progression of atherosclerotic disease. Also, involved in wound healing. On appropriate medications.  

## 2019-08-11 NOTE — Assessment & Plan Note (Signed)
We will have him return in 3 to 6 months with noninvasive studies to evaluate his perfusion.  Contact our office with any problems in the interim.

## 2019-08-11 NOTE — Patient Instructions (Signed)
Peripheral Vascular Disease  Peripheral vascular disease (PVD) is a disease of the blood vessels that are not part of your heart and brain. A simple term for PVD is poor circulation. In most cases, PVD narrows the blood vessels that carry blood from your heart to the rest of your body. This can reduce the supply of blood to your arms, legs, and internal organs, like your stomach or kidneys. However, PVD most often affects a person's lower legs and feet. Without treatment, PVD tends to get worse. PVD can also lead to acute ischemic limb. This is when an arm or leg suddenly cannot get enough blood. This is a medical emergency. Follow these instructions at home: Lifestyle  Do not use any products that contain nicotine or tobacco, such as cigarettes and e-cigarettes. If you need help quitting, ask your doctor.  Lose weight if you are overweight. Or, stay at a healthy weight as told by your doctor.  Eat a diet that is low in fat and cholesterol. If you need help, ask your doctor.  Exercise regularly. Ask your doctor for activities that are right for you. General instructions  Take over-the-counter and prescription medicines only as told by your doctor.  Take good care of your feet: ? Wear comfortable shoes that fit well. ? Check your feet often for any cuts or sores.  Keep all follow-up visits as told by your doctor This is important. Contact a doctor if:  You have cramps in your legs when you walk.  You have leg pain when you are at rest.  You have coldness in a leg or foot.  Your skin changes.  You are unable to get or have an erection (erectile dysfunction).  You have cuts or sores on your feet that do not heal. Get help right away if:  Your arm or leg turns cold, numb, and blue.  Your arms or legs become red, warm, swollen, painful, or numb.  You have chest pain.  You have trouble breathing.  You suddenly have weakness in your face, arm, or leg.  You become very  confused or you cannot speak.  You suddenly have a very bad headache.  You suddenly cannot see. Summary  Peripheral vascular disease (PVD) is a disease of the blood vessels.  A simple term for PVD is poor circulation. Without treatment, PVD tends to get worse.  Treatment may include exercise, low fat and low cholesterol diet, and quitting smoking. This information is not intended to replace advice given to you by your health care provider. Make sure you discuss any questions you have with your health care provider. Document Released: 01/02/2010 Document Revised: 09/20/2017 Document Reviewed: 11/15/2016 [employer] Patient Education  2020 [employer]  

## 2019-08-11 NOTE — Assessment & Plan Note (Signed)
Wound now healing again once we performed intervention about 6 weeks ago.  Almost completely healed at this point.  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

## 2019-08-11 NOTE — Assessment & Plan Note (Signed)
Rate is reasonably controlled and he is on anticoagulation

## 2019-08-11 NOTE — Assessment & Plan Note (Signed)
blood pressure control important in reducing the progression of atherosclerotic disease. On appropriate oral medications.  

## 2019-10-29 ENCOUNTER — Emergency Department: Payer: Medicare Other

## 2019-10-29 ENCOUNTER — Inpatient Hospital Stay
Admission: EM | Admit: 2019-10-29 | Discharge: 2019-11-04 | DRG: 917 | Disposition: A | Discharge status: Other Acute Inpatient Hospital | Payer: Medicare Other | Attending: Internal Medicine | Admitting: Internal Medicine

## 2019-10-29 ENCOUNTER — Other Ambulatory Visit: Payer: Self-pay

## 2019-10-29 DIAGNOSIS — F1410 Cocaine abuse, uncomplicated: Secondary | ICD-10-CM | POA: Diagnosis present

## 2019-10-29 DIAGNOSIS — F331 Major depressive disorder, recurrent, moderate: Secondary | ICD-10-CM | POA: Diagnosis present

## 2019-10-29 DIAGNOSIS — R45851 Suicidal ideations: Secondary | ICD-10-CM | POA: Diagnosis present

## 2019-10-29 DIAGNOSIS — E43 Unspecified severe protein-calorie malnutrition: Secondary | ICD-10-CM | POA: Diagnosis present

## 2019-10-29 DIAGNOSIS — Z681 Body mass index (BMI) 19 or less, adult: Secondary | ICD-10-CM

## 2019-10-29 DIAGNOSIS — Z794 Long term (current) use of insulin: Secondary | ICD-10-CM | POA: Diagnosis not present

## 2019-10-29 DIAGNOSIS — G8929 Other chronic pain: Secondary | ICD-10-CM | POA: Diagnosis present

## 2019-10-29 DIAGNOSIS — I2510 Atherosclerotic heart disease of native coronary artery without angina pectoris: Secondary | ICD-10-CM | POA: Diagnosis present

## 2019-10-29 DIAGNOSIS — F1414 Cocaine abuse with cocaine-induced mood disorder: Secondary | ICD-10-CM | POA: Diagnosis not present

## 2019-10-29 DIAGNOSIS — J9602 Acute respiratory failure with hypercapnia: Secondary | ICD-10-CM | POA: Diagnosis present

## 2019-10-29 DIAGNOSIS — Z20822 Contact with and (suspected) exposure to covid-19: Secondary | ICD-10-CM | POA: Diagnosis present

## 2019-10-29 DIAGNOSIS — I5032 Chronic diastolic (congestive) heart failure: Secondary | ICD-10-CM | POA: Diagnosis present

## 2019-10-29 DIAGNOSIS — T405X1A Poisoning by cocaine, accidental (unintentional), initial encounter: Principal | ICD-10-CM | POA: Diagnosis present

## 2019-10-29 DIAGNOSIS — F10931 Alcohol use, unspecified with withdrawal delirium: Secondary | ICD-10-CM

## 2019-10-29 DIAGNOSIS — I110 Hypertensive heart disease with heart failure: Secondary | ICD-10-CM | POA: Diagnosis present

## 2019-10-29 DIAGNOSIS — A419 Sepsis, unspecified organism: Secondary | ICD-10-CM | POA: Diagnosis present

## 2019-10-29 DIAGNOSIS — R6520 Severe sepsis without septic shock: Secondary | ICD-10-CM | POA: Diagnosis present

## 2019-10-29 DIAGNOSIS — F10231 Alcohol dependence with withdrawal delirium: Secondary | ICD-10-CM

## 2019-10-29 DIAGNOSIS — E1151 Type 2 diabetes mellitus with diabetic peripheral angiopathy without gangrene: Secondary | ICD-10-CM | POA: Diagnosis present

## 2019-10-29 DIAGNOSIS — J9600 Acute respiratory failure, unspecified whether with hypoxia or hypercapnia: Secondary | ICD-10-CM

## 2019-10-29 DIAGNOSIS — F1020 Alcohol dependence, uncomplicated: Secondary | ICD-10-CM | POA: Diagnosis present

## 2019-10-29 DIAGNOSIS — Z8249 Family history of ischemic heart disease and other diseases of the circulatory system: Secondary | ICD-10-CM

## 2019-10-29 DIAGNOSIS — G92 Toxic encephalopathy: Secondary | ICD-10-CM | POA: Diagnosis present

## 2019-10-29 DIAGNOSIS — Z7901 Long term (current) use of anticoagulants: Secondary | ICD-10-CM

## 2019-10-29 DIAGNOSIS — Z833 Family history of diabetes mellitus: Secondary | ICD-10-CM

## 2019-10-29 DIAGNOSIS — Z89512 Acquired absence of left leg below knee: Secondary | ICD-10-CM

## 2019-10-29 DIAGNOSIS — Z7902 Long term (current) use of antithrombotics/antiplatelets: Secondary | ICD-10-CM

## 2019-10-29 DIAGNOSIS — Z79899 Other long term (current) drug therapy: Secondary | ICD-10-CM

## 2019-10-29 DIAGNOSIS — Z955 Presence of coronary angioplasty implant and graft: Secondary | ICD-10-CM

## 2019-10-29 DIAGNOSIS — J690 Pneumonitis due to inhalation of food and vomit: Secondary | ICD-10-CM | POA: Diagnosis present

## 2019-10-29 DIAGNOSIS — E1140 Type 2 diabetes mellitus with diabetic neuropathy, unspecified: Secondary | ICD-10-CM | POA: Diagnosis present

## 2019-10-29 DIAGNOSIS — E1110 Type 2 diabetes mellitus with ketoacidosis without coma: Secondary | ICD-10-CM | POA: Diagnosis present

## 2019-10-29 DIAGNOSIS — I10 Essential (primary) hypertension: Secondary | ICD-10-CM | POA: Diagnosis not present

## 2019-10-29 DIAGNOSIS — M549 Dorsalgia, unspecified: Secondary | ICD-10-CM | POA: Diagnosis present

## 2019-10-29 DIAGNOSIS — J9601 Acute respiratory failure with hypoxia: Secondary | ICD-10-CM | POA: Diagnosis present

## 2019-10-29 DIAGNOSIS — Z8507 Personal history of malignant neoplasm of pancreas: Secondary | ICD-10-CM

## 2019-10-29 DIAGNOSIS — E876 Hypokalemia: Secondary | ICD-10-CM | POA: Diagnosis present

## 2019-10-29 DIAGNOSIS — I480 Paroxysmal atrial fibrillation: Secondary | ICD-10-CM | POA: Diagnosis present

## 2019-10-29 DIAGNOSIS — F17210 Nicotine dependence, cigarettes, uncomplicated: Secondary | ICD-10-CM | POA: Diagnosis present

## 2019-10-29 DIAGNOSIS — R569 Unspecified convulsions: Secondary | ICD-10-CM | POA: Diagnosis not present

## 2019-10-29 DIAGNOSIS — E1142 Type 2 diabetes mellitus with diabetic polyneuropathy: Secondary | ICD-10-CM | POA: Diagnosis not present

## 2019-10-29 LAB — GLUCOSE, CAPILLARY
Glucose-Capillary: 246 mg/dL — ABNORMAL HIGH (ref 70–99)
Glucose-Capillary: 297 mg/dL — ABNORMAL HIGH (ref 70–99)
Glucose-Capillary: 397 mg/dL — ABNORMAL HIGH (ref 70–99)
Glucose-Capillary: 426 mg/dL — ABNORMAL HIGH (ref 70–99)

## 2019-10-29 LAB — BLOOD GAS, ARTERIAL
Acid-base deficit: 0.2 mmol/L (ref 0.0–2.0)
Bicarbonate: 24 mmol/L (ref 20.0–28.0)
FIO2: 0.3
MECHVT: 450 mL
O2 Saturation: 87.7 %
PEEP: 8 cmH2O
RATE: 16 resp/min
pCO2 arterial: 37 mmHg (ref 32.0–48.0)
pH, Arterial: 7.42 (ref 7.350–7.450)
pO2, Arterial: 53 mmHg — ABNORMAL LOW (ref 83.0–108.0)

## 2019-10-29 LAB — BETA-HYDROXYBUTYRIC ACID: Beta-Hydroxybutyric Acid: 4.84 mmol/L — ABNORMAL HIGH (ref 0.05–0.27)

## 2019-10-29 LAB — COMPREHENSIVE METABOLIC PANEL
ALT: 15 U/L (ref 0–44)
AST: 23 U/L (ref 15–41)
Albumin: 4 g/dL (ref 3.5–5.0)
Alkaline Phosphatase: 29 U/L — ABNORMAL LOW (ref 38–126)
Anion gap: 18 — ABNORMAL HIGH (ref 5–15)
BUN: 11 mg/dL (ref 8–23)
CO2: 23 mmol/L (ref 22–32)
Calcium: 9.3 mg/dL (ref 8.9–10.3)
Chloride: 99 mmol/L (ref 98–111)
Creatinine, Ser: 0.89 mg/dL (ref 0.61–1.24)
GFR calc Af Amer: >60 mL/min (ref >60)
GFR calc non Af Amer: >60 mL/min (ref >60)
Glucose, Bld: 445 mg/dL — ABNORMAL HIGH (ref 70–99)
Potassium: 4 mmol/L (ref 3.5–5.1)
Sodium: 140 mmol/L (ref 135–145)
Total Bilirubin: 1.1 mg/dL (ref 0.3–1.2)
Total Protein: 7.4 g/dL (ref 6.5–8.1)

## 2019-10-29 LAB — CBC WITH DIFFERENTIAL/PLATELET
Abs Immature Granulocytes: 0.18 10*3/uL — ABNORMAL HIGH (ref 0.00–0.07)
Basophils Absolute: 0 10*3/uL (ref 0.0–0.1)
Basophils Relative: 0 %
Eosinophils Absolute: 0 10*3/uL (ref 0.0–0.5)
Eosinophils Relative: 0 %
HCT: 34.7 % — ABNORMAL LOW (ref 39.0–52.0)
Hemoglobin: 11.4 g/dL — ABNORMAL LOW (ref 13.0–17.0)
Immature Granulocytes: 2 %
Lymphocytes Relative: 16 %
Lymphs Abs: 1.6 10*3/uL (ref 0.7–4.0)
MCH: 28.9 pg (ref 26.0–34.0)
MCHC: 32.9 g/dL (ref 30.0–36.0)
MCV: 88.1 fL (ref 80.0–100.0)
Monocytes Absolute: 0.5 10*3/uL (ref 0.1–1.0)
Monocytes Relative: 5 %
Neutro Abs: 7.5 10*3/uL (ref 1.7–7.7)
Neutrophils Relative %: 77 %
Platelets: 292 10*3/uL (ref 150–400)
RBC: 3.94 MIL/uL — ABNORMAL LOW (ref 4.22–5.81)
RDW: 13.6 % (ref 11.5–15.5)
WBC: 9.9 10*3/uL (ref 4.0–10.5)
nRBC: 0 % (ref 0.0–0.2)

## 2019-10-29 LAB — BASIC METABOLIC PANEL
Anion gap: 17 — ABNORMAL HIGH (ref 5–15)
BUN: 16 mg/dL (ref 8–23)
CO2: 18 mmol/L — ABNORMAL LOW (ref 22–32)
Calcium: 8.3 mg/dL — ABNORMAL LOW (ref 8.9–10.3)
Chloride: 105 mmol/L (ref 98–111)
Creatinine, Ser: 1.05 mg/dL (ref 0.61–1.24)
GFR calc Af Amer: >60 mL/min (ref >60)
GFR calc non Af Amer: >60 mL/min (ref >60)
Glucose, Bld: 354 mg/dL — ABNORMAL HIGH (ref 70–99)
Potassium: 3.4 mmol/L — ABNORMAL LOW (ref 3.5–5.1)
Sodium: 140 mmol/L (ref 135–145)

## 2019-10-29 LAB — URINE DRUG SCREEN, QUALITATIVE (ARMC ONLY)
Amphetamines, Ur Screen: NOT DETECTED
Barbiturates, Ur Screen: NOT DETECTED
Benzodiazepine, Ur Scrn: NOT DETECTED
Cannabinoid 50 Ng, Ur ~~LOC~~: NOT DETECTED
Cocaine Metabolite,Ur ~~LOC~~: POSITIVE — AB
MDMA (Ecstasy)Ur Screen: NOT DETECTED
Methadone Scn, Ur: NOT DETECTED
Opiate, Ur Screen: NOT DETECTED
Phencyclidine (PCP) Ur S: NOT DETECTED
Tricyclic, Ur Screen: NOT DETECTED

## 2019-10-29 LAB — MAGNESIUM: Magnesium: 1.9 mg/dL (ref 1.7–2.4)

## 2019-10-29 LAB — BLOOD GAS, VENOUS
Acid-Base Excess: 0.3 mmol/L (ref 0.0–2.0)
Bicarbonate: 25.4 mmol/L (ref 20.0–28.0)
FIO2: 0.21
O2 Saturation: 52 %
Patient temperature: 37
pCO2, Ven: 42 mmHg — ABNORMAL LOW (ref 44.0–60.0)
pH, Ven: 7.39 (ref 7.250–7.430)
pO2, Ven: <31 mmHg — CL (ref 32.0–45.0)

## 2019-10-29 LAB — URINALYSIS, ROUTINE W REFLEX MICROSCOPIC
Bacteria, UA: NONE SEEN
Bilirubin Urine: NEGATIVE
Glucose, UA: >=500 mg/dL — AB
Ketones, ur: 20 mg/dL — AB
Leukocytes,Ua: NEGATIVE
Nitrite: NEGATIVE
Protein, ur: 30 mg/dL — AB
Specific Gravity, Urine: 1.031 — ABNORMAL HIGH (ref 1.005–1.030)
Squamous Epithelial / HPF: NONE SEEN (ref 0–5)
pH: 6 (ref 5.0–8.0)

## 2019-10-29 LAB — TRIGLYCERIDES: Triglycerides: 91 mg/dL (ref <150)

## 2019-10-29 LAB — RESPIRATORY PANEL BY RT PCR (FLU A&B, COVID)
Influenza A by PCR: NEGATIVE
Influenza B by PCR: NEGATIVE
SARS Coronavirus 2 by RT PCR: NEGATIVE

## 2019-10-29 LAB — CK: Total CK: 169 U/L (ref 49–397)

## 2019-10-29 LAB — LACTIC ACID, PLASMA
Lactic Acid, Venous: 5.3 mmol/L (ref 0.5–1.9)
Lactic Acid, Venous: 2.3 mmol/L (ref 0.5–1.9)

## 2019-10-29 LAB — OSMOLALITY: Osmolality: 314 mOsm/kg — ABNORMAL HIGH (ref 275–295)

## 2019-10-29 LAB — APTT: aPTT: 25 seconds (ref 24–36)

## 2019-10-29 LAB — PROCALCITONIN: Procalcitonin: <0.1 ng/mL

## 2019-10-29 LAB — POC SARS CORONAVIRUS 2 AG: SARS Coronavirus 2 Ag: NEGATIVE

## 2019-10-29 LAB — PROTIME-INR
INR: 1.1 (ref 0.8–1.2)
Prothrombin Time: 13.9 seconds (ref 11.4–15.2)

## 2019-10-29 LAB — TROPONIN I (HIGH SENSITIVITY)
Troponin I (High Sensitivity): 29 ng/L — ABNORMAL HIGH (ref <18)
Troponin I (High Sensitivity): 34 ng/L — ABNORMAL HIGH (ref <18)

## 2019-10-29 LAB — MRSA PCR SCREENING: MRSA by PCR: NEGATIVE

## 2019-10-29 LAB — ETHANOL: Alcohol, Ethyl (B): <10 mg/dL (ref <10)

## 2019-10-29 MED ORDER — THIAMINE HCL 100 MG PO TABS
100.0000 mg | ORAL_TABLET | Freq: Every day | ORAL | Status: DC
  Administered 2019-10-29 – 2019-11-04 (×7): 100 mg via ORAL
  Filled 2019-10-29 (×7): qty 1

## 2019-10-29 MED ORDER — FAMOTIDINE IN NACL 20-0.9 MG/50ML-% IV SOLN
20.0000 mg | Freq: Two times a day (BID) | INTRAVENOUS | Status: DC
  Administered 2019-10-29 – 2019-10-30 (×3): 20 mg via INTRAVENOUS
  Filled 2019-10-29 (×3): qty 50

## 2019-10-29 MED ORDER — ORAL CARE MOUTH RINSE
15.0000 mL | OROMUCOSAL | Status: DC
  Administered 2019-10-30 – 2019-11-01 (×25): 15 mL via OROMUCOSAL

## 2019-10-29 MED ORDER — PROPOFOL 1000 MG/100ML IV EMUL
0.0000 ug/kg/min | INTRAVENOUS | Status: DC
  Administered 2019-10-29: 20 ug/kg/min via INTRAVENOUS
  Administered 2019-10-30 (×2): 50 ug/kg/min via INTRAVENOUS
  Filled 2019-10-29 (×3): qty 100

## 2019-10-29 MED ORDER — ACETAMINOPHEN 650 MG RE SUPP
650.0000 mg | Freq: Once | RECTAL | Status: AC
  Administered 2019-10-29: 650 mg via RECTAL
  Filled 2019-10-29: qty 1

## 2019-10-29 MED ORDER — SODIUM CHLORIDE 0.9 % IV BOLUS (SEPSIS)
1000.0000 mL | Freq: Once | INTRAVENOUS | Status: AC
  Administered 2019-10-29: 1000 mL via INTRAVENOUS

## 2019-10-29 MED ORDER — LORAZEPAM 2 MG/ML IJ SOLN
INTRAMUSCULAR | Status: AC
  Filled 2019-10-29: qty 1

## 2019-10-29 MED ORDER — FAMOTIDINE IN NACL 20-0.9 MG/50ML-% IV SOLN: 20.0000 mg | Freq: Two times a day (BID) | INTRAVENOUS | Status: DC

## 2019-10-29 MED ORDER — SODIUM CHLORIDE 0.9 % IV SOLN
3.0000 g | Freq: Four times a day (QID) | INTRAVENOUS | Status: DC
  Administered 2019-10-29 – 2019-11-04 (×23): 3 g via INTRAVENOUS
  Filled 2019-10-29 (×2): qty 3
  Filled 2019-10-29: qty 8
  Filled 2019-10-29 (×2): qty 3
  Filled 2019-10-29 (×2): qty 8
  Filled 2019-10-29 (×3): qty 3
  Filled 2019-10-29: qty 8
  Filled 2019-10-29: qty 3
  Filled 2019-10-29: qty 8
  Filled 2019-10-29 (×2): qty 3
  Filled 2019-10-29 (×2): qty 8
  Filled 2019-10-29 (×4): qty 3
  Filled 2019-10-29: qty 8
  Filled 2019-10-29 (×7): qty 3
  Filled 2019-10-29: qty 8

## 2019-10-29 MED ORDER — PROPOFOL 1000 MG/100ML IV EMUL
INTRAVENOUS | Status: AC
  Filled 2019-10-29: qty 100

## 2019-10-29 MED ORDER — METOCLOPRAMIDE HCL 5 MG/ML IJ SOLN: 10.0000 mg | Freq: Once | INTRAMUSCULAR | Status: DC

## 2019-10-29 MED ORDER — IOHEXOL 300 MG/ML  SOLN
100.0000 mL | Freq: Once | INTRAMUSCULAR | Status: AC | PRN
  Administered 2019-10-29: 100 mL via INTRAVENOUS

## 2019-10-29 MED ORDER — FENTANYL CITRATE (PF) 100 MCG/2ML IJ SOLN: 100.0000 ug | INTRAMUSCULAR | Status: DC | PRN

## 2019-10-29 MED ORDER — SODIUM CHLORIDE 0.9% FLUSH
3.0000 mL | Freq: Two times a day (BID) | INTRAVENOUS | Status: DC
  Administered 2019-10-29 – 2019-11-03 (×10): 3 mL via INTRAVENOUS

## 2019-10-29 MED ORDER — ETOMIDATE 2 MG/ML IV SOLN
15.0000 mg | Freq: Once | INTRAVENOUS | Status: AC
  Administered 2019-10-29: 15 mg via INTRAVENOUS

## 2019-10-29 MED ORDER — MIDAZOLAM HCL 5 MG/5ML IJ SOLN
4.0000 mg | Freq: Once | INTRAMUSCULAR | Status: AC
  Administered 2019-10-29: 4 mg via INTRAVENOUS
  Filled 2019-10-29: qty 5

## 2019-10-29 MED ORDER — SUCCINYLCHOLINE CHLORIDE 20 MG/ML IJ SOLN
90.0000 mg | Freq: Once | INTRAMUSCULAR | Status: AC
  Administered 2019-10-29: 90 mg via INTRAVENOUS

## 2019-10-29 MED ORDER — CHLORHEXIDINE GLUCONATE 0.12% ORAL RINSE (MEDLINE KIT)
15.0000 mL | Freq: Two times a day (BID) | OROMUCOSAL | Status: DC
  Administered 2019-10-30 – 2019-11-01 (×5): 15 mL via OROMUCOSAL

## 2019-10-29 MED ORDER — METRONIDAZOLE IN NACL 5-0.79 MG/ML-% IV SOLN
500.0000 mg | Freq: Once | INTRAVENOUS | Status: AC
  Administered 2019-10-29: 500 mg via INTRAVENOUS
  Filled 2019-10-29: qty 100

## 2019-10-29 MED ORDER — LACTATED RINGERS IV SOLN: INTRAVENOUS | Status: DC

## 2019-10-29 MED ORDER — FOLIC ACID 1 MG PO TABS
1.0000 mg | ORAL_TABLET | Freq: Every day | ORAL | Status: DC
  Administered 2019-10-29 – 2019-11-04 (×7): 1 mg via ORAL
  Filled 2019-10-29 (×7): qty 1

## 2019-10-29 MED ORDER — ENOXAPARIN SODIUM 40 MG/0.4ML ~~LOC~~ SOLN
40.0000 mg | SUBCUTANEOUS | Status: DC
  Administered 2019-10-29: 21:00:00 40 mg via SUBCUTANEOUS
  Filled 2019-10-29: qty 0.4

## 2019-10-29 MED ORDER — ONDANSETRON HCL 4 MG/2ML IJ SOLN
INTRAMUSCULAR | Status: AC
  Administered 2019-10-29: 10:00:00 4 mg
  Filled 2019-10-29: qty 2

## 2019-10-29 MED ORDER — ACETAMINOPHEN 325 MG RE SUPP
325.0000 mg | Freq: Once | RECTAL | Status: AC
  Administered 2019-10-29: 325 mg via RECTAL
  Filled 2019-10-29: qty 1

## 2019-10-29 MED ORDER — POTASSIUM CHLORIDE 10 MEQ/100ML IV SOLN
10.0000 meq | INTRAVENOUS | Status: AC
  Administered 2019-10-29 – 2019-10-30 (×4): 10 meq via INTRAVENOUS
  Filled 2019-10-29 (×4): qty 100

## 2019-10-29 MED ORDER — ADULT MULTIVITAMIN W/MINERALS CH
1.0000 | ORAL_TABLET | Freq: Every day | ORAL | Status: DC
  Administered 2019-10-29 – 2019-11-04 (×7): 1 via ORAL
  Filled 2019-10-29 (×7): qty 1

## 2019-10-29 MED ORDER — CHLORHEXIDINE GLUCONATE CLOTH 2 % EX PADS
6.0000 | MEDICATED_PAD | Freq: Every day | CUTANEOUS | Status: DC
  Administered 2019-10-29 – 2019-11-03 (×6): 6 via TOPICAL
  Filled 2019-10-29: qty 6

## 2019-10-29 MED ORDER — ONDANSETRON HCL 4 MG/2ML IJ SOLN: 4.0000 mg | Freq: Four times a day (QID) | INTRAMUSCULAR | Status: DC | PRN

## 2019-10-29 MED ORDER — INSULIN REGULAR(HUMAN) IN NACL 100-0.9 UT/100ML-% IV SOLN
INTRAVENOUS | Status: DC
  Administered 2019-10-29: 7.5 [IU]/h via INTRAVENOUS
  Filled 2019-10-29: qty 100

## 2019-10-29 MED ORDER — MAGNESIUM SULFATE 2 GM/50ML IV SOLN: 2.0000 g | Freq: Once | INTRAVENOUS | Status: DC

## 2019-10-29 MED ORDER — SODIUM CHLORIDE 0.9 % IV SOLN: INTRAVENOUS | Status: DC

## 2019-10-29 MED ORDER — SODIUM CHLORIDE 0.9 % IV SOLN
2.0000 g | Freq: Once | INTRAVENOUS | Status: AC
  Administered 2019-10-29: 2 g via INTRAVENOUS
  Filled 2019-10-29 (×2): qty 2

## 2019-10-29 MED ORDER — THIAMINE HCL 100 MG/ML IJ SOLN
100.0000 mg | Freq: Once | INTRAMUSCULAR | Status: AC
  Administered 2019-10-29: 100 mg via INTRAVENOUS
  Filled 2019-10-29: qty 2

## 2019-10-29 MED ORDER — VANCOMYCIN HCL IN DEXTROSE 1-5 GM/200ML-% IV SOLN
1000.0000 mg | Freq: Once | INTRAVENOUS | Status: AC
  Administered 2019-10-29: 1000 mg via INTRAVENOUS
  Filled 2019-10-29 (×2): qty 200

## 2019-10-29 MED ORDER — SODIUM CHLORIDE 0.9% FLUSH: 3.0000 mL | INTRAVENOUS | Status: DC | PRN

## 2019-10-29 MED ORDER — FENTANYL CITRATE (PF) 100 MCG/2ML IJ SOLN
100.0000 ug | INTRAMUSCULAR | Status: AC | PRN
  Administered 2019-10-29 – 2019-10-30 (×3): 100 ug via INTRAVENOUS
  Filled 2019-10-29 (×4): qty 2

## 2019-10-29 MED ORDER — SODIUM CHLORIDE 0.9 % IV SOLN: 250.0000 mL | INTRAVENOUS | Status: DC | PRN

## 2019-10-29 MED ORDER — DEXTROSE 50 % IV SOLN: 0.0000 mL | INTRAVENOUS | Status: DC | PRN

## 2019-10-29 MED ORDER — DEXTROSE-NACL 5-0.45 % IV SOLN: INTRAVENOUS | Status: DC

## 2019-10-29 MED ORDER — ENOXAPARIN SODIUM 40 MG/0.4ML ~~LOC~~ SOLN: 40.0000 mg | SUBCUTANEOUS | Status: DC

## 2019-10-29 MED ORDER — ACETAMINOPHEN 325 MG PO TABS
650.0000 mg | ORAL_TABLET | ORAL | Status: DC | PRN
  Administered 2019-10-29 – 2019-11-03 (×3): 650 mg via ORAL
  Filled 2019-10-29 (×5): qty 2

## 2019-10-29 MED ORDER — LORAZEPAM 2 MG/ML IJ SOLN
2.0000 mg | Freq: Once | INTRAMUSCULAR | Status: AC
  Administered 2019-10-29: 10:00:00 2 mg via INTRAVENOUS

## 2019-10-29 MED ORDER — ONDANSETRON HCL 4 MG/2ML IJ SOLN
INTRAMUSCULAR | Status: AC
  Administered 2019-10-29: 4 mg
  Filled 2019-10-29: qty 2

## 2019-10-29 MED ORDER — DEXTROSE IN LACTATED RINGERS 5 % IV SOLN: INTRAVENOUS | Status: DC

## 2019-10-29 NOTE — ED Notes (Signed)
Pt suctioned by md isaacs

## 2019-10-29 NOTE — ED Provider Notes (Signed)
[facility] Emergency Department Provider Note  ____________________________________________   First MD Initiated Contact with Patient 10/29/19 0945     (approximate)  I have reviewed the triage vital signs and the nursing notes.   HISTORY  Chief Complaint Fall and Tremors    HPI Richard [name] is a 64 y.o. male  With extensive PMHx including polysubstance abuse, CHF, HTN, DM, AFib, here with fever, AMS. Pt was reportedly found on the ground in his house today, confused, altered. Unknown when he was last normal. He was found to be febrile, tachycardic, confused with EMS. NO known seizure like activity. Pt only tells me he is "cold" and says he hasn't had a drink today. Remainder of history limited 2/2 AMS, confusion, illness.  Level 5 caveat invoked as remainder of history, ROS, and physical exam limited due to patient's AMS.         Past Medical History:  Diagnosis Date  . Atrial fibrillation ([place])    per patient  . Cancer ([place])   . CHF (congestive heart failure) ([place])   . Chronic back pain   . Chronic leg pain   . Coronary artery disease   . Diabetes mellitus without complication ([place])   . Hypertension   . Neuropathy     Patient Active Problem List   Diagnosis Date Noted  . Acute respiratory failure with hypoxia ([place]) 10/29/2019  . PAD (peripheral artery disease) ([place]) 04/28/2019  . Below-knee amputation of left lower extremity ([place]) 04/20/2019  . Cocaine use disorder ([place])   . Pressure injury of skin 03/12/2019  . Respiratory failure ([place]) 02/28/2019  . Diabetes with hyperosmolar coma ([place]) 11/11/2018  . A-fib ([place]) 09/20/2018  . Hyperglycemia 08/03/2018  . Protein-calorie malnutrition, severe 01/10/2018  . Sepsis ([place]) 01/09/2018  . DKA, type 2 ([place]) 09/05/2017  . Ileus ([place]) 08/24/2017  . Diabetes ([place]) 08/24/2017  . Atrial flutter ([place]) 08/24/2017  . CAD (coronary artery disease) 08/24/2017  . HTN (hypertension) 08/24/2017  .  Atrial fibrillation ([place]) 08/24/2017  . Tobacco use disorder, severe, dependence 07/25/2017  . Wound dehiscence, surgical, initial encounter 06/24/2017  . Acute hematogenous osteomyelitis of right foot ([place]) 06/20/2017  . Adenocarcinoma of pancreas ([place]) 06/20/2017  . Esophagitis 06/20/2017  . Cellulitis of right lower extremity 06/10/2017  . Hypertension, poor control 06/10/2017  . Insulin dependent type 2 diabetes mellitus ([place]) 06/10/2017  . Chest pain 08/10/2015    Past Surgical History:  Procedure Laterality Date  . AMPUTATION Left 03/13/2019   Procedure: AMPUTATION BELOW KNEE;  Surgeon: Algernon Huxley, MD;  Location: [facility] ORS;  Service: Vascular;  Laterality: Left;  . CORONARY ANGIOPLASTY WITH STENT PLACEMENT    . EMBOLECTOMY Left 03/12/2019   Procedure: EMBOLECTOMY SFA POPLITEAL;  Surgeon: Algernon Huxley, MD;  Location: [facility] ORS;  Service: Vascular;  Laterality: Left;  . ENDARTERECTOMY FEMORAL Left 03/12/2019   Procedure: ENDARTERECTOMY FEMORAL;  Surgeon: Algernon Huxley, MD;  Location: [facility] ORS;  Service: Vascular;  Laterality: Left;  . ESOPHAGOGASTRODUODENOSCOPY N/A 09/11/2017   Procedure: ESOPHAGOGASTRODUODENOSCOPY (EGD);  Surgeon: Virgel Manifold, MD;  Location: [facility] ENDOSCOPY;  Service: Endoscopy;  Laterality: N/A;  . LOWER EXTREMITY ANGIOGRAPHY Left 11/17/2018   Procedure: Lower Extremity Angiography, with possible intervention;  Surgeon: Algernon Huxley, MD;  Location: [place] CV LAB;  Service: Cardiovascular;  Laterality: Left;  . LOWER EXTREMITY ANGIOGRAPHY Left 03/04/2019   Procedure: Lower Extremity Angiography;  Surgeon: Algernon Huxley, MD;  Location: [place] CV LAB;  Service: Cardiovascular;  Laterality: Left;  . LOWER EXTREMITY ANGIOGRAPHY Left 07/01/2019   Procedure: LOWER EXTREMITY ANGIOGRAPHY;  Surgeon: Algernon Huxley, MD;  Location: [place] CV LAB;  Service: Cardiovascular;  Laterality: Left;  . pancreas removed      Prior to Admission medications     Medication Sig Start Date End Date Taking? Authorizing Provider  insulin aspart (NOVOLOG) 100 UNIT/ML injection Inject 0-5 Units into the skin at bedtime. CBG < 70: implement hypoglycemia protocol CBG 70 - 120: 0 units CBG 121 - 150: 0 units CBG 151 - 200: 0 units CBG 201 - 250: 2 units CBG 251 - 300: 3 units CBG 301 - 350: 4 units CBG 351 - 400: 5 units CBG > 400: call MD 11/18/18  Yes Dustin Flock, MD  insulin aspart (NOVOLOG) 100 UNIT/ML injection Inject 10 Units into the skin 3 (three) times daily with meals. 03/18/19  Yes Bettey Costa, MD  Multiple Vitamin (MULTIVITAMIN WITH MINERALS) TABS tablet Take 1 tablet by mouth daily. 01/12/18  Yes Gouru, Illene Silver, MD  ACCU-CHEK AVIVA PLUS test strip U ONCE TO BID UTD 09/23/18   Nicholes Mango, MD  ACCU-CHEK SOFTCLIX LANCETS lancets TEST 1-2 XD 09/23/18   Gouru, Illene Silver, MD  acetaminophen (TYLENOL) 325 MG tablet Take 1 tablet (325 mg total) by mouth every 6 (six) hours as needed for mild pain (or Fever >/= 101). 01/11/18   Gouru, Aruna, MD  apixaban (ELIQUIS) 5 MG TABS tablet Take 1 tablet (5 mg total) by mouth 2 (two) times daily. Patient not taking: Reported on 10/29/2019 09/23/18   Nicholes Mango, MD  Blood Glucose Monitoring Suppl (ACCU-CHEK AVIVA PLUS) w/Device KIT U TO TEST ONCE TO BID 09/23/18   Gouru, Illene Silver, MD  carvedilol (COREG) 3.125 MG tablet Take 1 tablet (3.125 mg total) by mouth 2 (two) times daily with a meal. Patient not taking: Reported on 10/29/2019 03/18/19   Bettey Costa, MD  doxycycline (VIBRAMYCIN) 100 MG capsule Take 1 capsule (100 mg total) by mouth 2 (two) times daily. Patient not taking: Reported on 08/11/2019 07/15/19   Kris Hartmann, NP  Ensure Max Protein (ENSURE MAX PROTEIN) LIQD Take 330 mLs (11 oz total) by mouth 2 (two) times daily between meals. Patient not taking: Reported on 10/29/2019 03/18/19   Bettey Costa, MD  gabapentin (NEURONTIN) 100 MG capsule Take 1 capsule (100 mg total) by mouth 3 (three) times daily. Patient taking  differently: Take 100 mg by mouth daily as needed.  08/04/18   Salary, Avel Peace, MD  insulin glargine (LANTUS) 100 UNIT/ML injection Inject 0.19 mLs (19 Units total) into the skin daily. Patient not taking: Reported on 10/29/2019 03/18/19   Bettey Costa, MD  Needles & Syringes MISC Use as directed Patient not taking: Reported on 10/29/2019 09/23/18   Nicholes Mango, MD  oxyCODONE-acetaminophen (PERCOCET/ROXICET) 5-325 MG tablet Take 1-2 tablets by mouth every 4 (four) hours as needed for severe pain. Patient not taking: Reported on 10/29/2019 07/15/19   Kris Hartmann, NP  pantoprazole (PROTONIX) 40 MG tablet Take 1 tablet (40 mg total) by mouth daily. Patient not taking: Reported on 10/29/2019 03/18/19   Bettey Costa, MD  rosuvastatin (CRESTOR) 40 MG tablet Take 1 tablet (40 mg total) by mouth daily at 6 PM. Patient not taking: Reported on 10/29/2019 03/18/19   Bettey Costa, MD  ticagrelor (BRILINTA) 90 MG TABS tablet Take 1 tablet (90 mg total) by mouth 2 (two) times daily. Patient not taking: Reported on 10/29/2019 03/18/19  Bettey Costa, MD    Allergies Patient has no known allergies.  Family History  Problem Relation Age of Onset  . CAD Mother   . Diabetes Mellitus II Mother   . Diabetes Mellitus II Father     Social History Social History   Tobacco Use  . Smoking status: Current Every Day Smoker    Packs/day: 0.50  . Smokeless tobacco: Never Used  Substance Use Topics  . Alcohol use: No    Alcohol/week: 0.0 standard drinks  . 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 Cocaine used over a year ago per pt.    Review of Systems  Review of Systems  Unable to perform ROS: Mental status change     ____________________________________________  PHYSICAL EXAM:      VITAL SIGNS: ED Triage Vitals  Enc Vitals Group     BP 10/29/19 0948 (!) 169/122     Pulse Rate 10/29/19 0948 (!) 137     Resp --      Temp 10/29/19 0947 (!) 102.1 F (38.9 C)     Temp Source  10/29/19 0947 Oral     SpO2 10/29/19 0948 (!) 88 %     Weight 10/29/19 0939 129 lb (58.5 kg)     Height 10/29/19 0939 5' 7"  (1.702 m)     Head Circumference --      Peak Flow --      Pain Score 10/29/19 0939 0     Pain Loc --      Pain Edu? --      Excl. in [place]? --      Physical Exam Vitals and nursing note reviewed.  Constitutional:      Comments: Drowsy, minimally responsive, ill-appearing, curled into fetal position, mumbling  HENT:     Head: Normocephalic and atraumatic.     Comments: No apparent head trauma    Mouth/Throat:     Mouth: Mucous membranes are dry.  Eyes:     Conjunctiva/sclera: Conjunctivae normal.     Pupils: Pupils are equal, round, and reactive to light.  Cardiovascular:     Rate and Rhythm: Tachycardia present.     Heart sounds: No murmur.  Pulmonary:     Effort: Respiratory distress present.     Breath sounds: Rhonchi present.     Comments: Tachypneic with increased WOB, rhonchi b/l Abdominal:     General: Abdomen is flat.  Musculoskeletal:     Comments: S/p AKA, no edema  Skin:    General: Skin is dry.     Capillary Refill: Capillary refill takes 2 to 3 seconds.  Neurological:     Mental Status: He is disoriented and confused.     Comments: MAE with at least antigravity strength. Confused, mumbles to questions. Does not follow commands.       ____________________________________________   LABS (all labs ordered are listed, but only abnormal results are displayed)  Labs Reviewed  LACTIC ACID, PLASMA - Abnormal; Notable for the following components:      Result Value   Lactic Acid, Venous 5.3 (*)    All other components within normal limits  LACTIC ACID, PLASMA - Abnormal; Notable for the following components:   Lactic Acid, Venous 2.3 (*)    All other components within normal limits  CBC WITH DIFFERENTIAL/PLATELET - Abnormal; Notable for the following components:   RBC 3.94 (*)    Hemoglobin 11.4 (*)    HCT 34.7 (*)  Abs Immature  Granulocytes 0.18 (*)    All other components within normal limits  URINALYSIS, ROUTINE W REFLEX MICROSCOPIC - Abnormal; Notable for the following components:   Color, Urine STRAW (*)    APPearance CLEAR (*)    Specific Gravity, Urine 1.031 (*)    Glucose, UA >=500 (*)    Hgb urine dipstick SMALL (*)    Ketones, ur 20 (*)    Protein, ur 30 (*)    All other components within normal limits  COMPREHENSIVE METABOLIC PANEL - Abnormal; Notable for the following components:   Glucose, Bld 445 (*)    Alkaline Phosphatase 29 (*)    Anion gap 18 (*)    All other components within normal limits  BLOOD GAS, VENOUS - Abnormal; Notable for the following components:   pCO2, Ven 42 (*)    pO2, Ven <31.0 (*)    All other components within normal limits  URINE DRUG SCREEN, QUALITATIVE ([facility] ONLY) - Abnormal; Notable for the following components:   Cocaine Metabolite,Ur [employer] POSITIVE (*)    All other components within normal limits  BLOOD GAS, ARTERIAL - Abnormal; Notable for the following components:   pO2, Arterial 53 (*)    All other components within normal limits  TROPONIN I (HIGH SENSITIVITY) - Abnormal; Notable for the following components:   Troponin I (High Sensitivity) 29 (*)    All other components within normal limits  TROPONIN I (HIGH SENSITIVITY) - Abnormal; Notable for the following components:   Troponin I (High Sensitivity) 34 (*)    All other components within normal limits  RESPIRATORY PANEL BY RT PCR (FLU A&B, COVID)  CULTURE, BLOOD (ROUTINE X 2)  CULTURE, BLOOD (ROUTINE X 2)  URINE CULTURE  APTT  PROTIME-INR  MAGNESIUM  CK  ETHANOL  TRIGLYCERIDES  CBC  BASIC METABOLIC PANEL  BLOOD GAS, ARTERIAL  POC SARS CORONAVIRUS 2 AG -  ED  POC SARS CORONAVIRUS 2 AG    ____________________________________________  EKG: Atrial fibrillation vs sinus tach, unclear based on artifact. VR 151, QRS 96, QTc 566. Repol abnormality. Prolonged QT. Non-specific ST changes, depressions  anteriorly possibly rate related. ________________________________________  RADIOLOGY All imaging, including plain films, CT scans, and ultrasounds, independently reviewed by me, and interpretations confirmed via formal radiology reads.  ED MD interpretation:   CT head: NAICA CXR: Interstitial pattern, no focal PNA CT A/P: Gastric distension, patchy RLL disease c/w PNA CXR 2: ET tube appropriate position  Official radiology report(s): CT Head Wo Contrast  Result Date: 10/29/2019 CLINICAL DATA:  Unwitnessed fall with altered mental status EXAM: CT HEAD WITHOUT CONTRAST TECHNIQUE: Contiguous axial images were obtained from the base of the skull through the vertex without intravenous contrast. COMPARISON:  Feb 28, 2019 FINDINGS: Brain: Slight diffuse atrophy is stable. Prominence of the cisterna magna is an apparent anatomic variant. There is no intracranial mass, hemorrhage, extra-axial fluid collection, or midline shift. There is small vessel disease in the centra semiovale bilaterally, stable. No evident acute infarct. A small focus of basal ganglia calcification on the right is likely physiologic. Vascular: There is no hyperdense vessel. There are foci of calcification in each carotid siphon region. Skull: The bony calvarium appears intact. Sinuses/Orbits: There is opacification in multiple ethmoid air cells. There is opacification in the anterior, lateral left sphenoid sinus. Other visualized paranasal sinuses are clear. Visualized orbits appear symmetric bilaterally. Other: Visualized mastoid air cells are clear. IMPRESSION: 1. Stable mild atrophy with periventricular small vessel disease. No acute infarct. No  mass or hemorrhage. 2.  Foci of arterial vascular calcification noted. 3.  Multiple foci of paranasal sinus disease. Electronically Signed   By: Lowella Grip III M.D.   On: 10/29/2019 10:45   CT ABDOMEN PELVIS W CONTRAST  Result Date: 10/29/2019 CLINICAL DATA:  Acute abdominal pain,  neutropenia EXAM: CT ABDOMEN AND PELVIS WITH CONTRAST TECHNIQUE: Multidetector CT imaging of the abdomen and pelvis was performed using the standard protocol following bolus administration of intravenous contrast. CONTRAST:  11m OMNIPAQUE IOHEXOL 300 MG/ML  SOLN COMPARISON:  09/05/2017 FINDINGS: Lower chest: Patchy right lower lobe airspace disease and right middle lobe airspace disease concerning for pneumonia. Hepatobiliary: No focal liver abnormality is seen. No gallstones, gallbladder wall thickening, or biliary dilatation. Pancreas: Unremarkable. No pancreatic ductal dilatation or surrounding inflammatory changes. Spleen: Prior splenectomy. Adrenals/Urinary Tract: Adrenal glands are unremarkable. Kidneys are normal, without renal calculi, focal lesion, or hydronephrosis. Bladder is unremarkable. Stomach/Bowel: Gastric distension with an air-fluid level. Appendix appears normal. No evidence of bowel wall thickening, distention, or inflammatory changes. Vascular/Lymphatic: Normal caliber abdominal aorta with mild atherosclerosis. No lymphadenopathy. Reproductive: Prostate is unremarkable. Other: No abdominal wall hernia or abnormality. No abdominopelvic ascites. Musculoskeletal: No acute osseous abnormality. No aggressive osseous lesion. IMPRESSION: 1. Patchy right lower lobe airspace disease and right middle lobe airspace disease concerning for pneumonia. 2. Gastric air-fluid level without evidence of definite gastric outlet obstruction. This appearance can be seen with dysmotility/gastroparesis. 3.  Aortic Atherosclerosis (ICD10-I70.0). Electronically Signed   By: HKathreen Devoid  On: 10/29/2019 13:28   DG Chest Portable 1 View  Result Date: 10/29/2019 CLINICAL DATA:  Intubated EXAM: PORTABLE CHEST 1 VIEW COMPARISON:  Chest radiograph from earlier today. FINDINGS: Endotracheal tube tip is 3.2 cm above the carina. Enteric tube terminates in the proximal stomach. Stable cardiomediastinal silhouette with normal  heart size. No pneumothorax. No pleural effusion. Mild hazy bilateral parahilar lung opacities appear slightly worsened. Pacer pad overlies the heart and left chest. IMPRESSION: 1. Well-positioned endotracheal and enteric tubes. 2. Slightly increased mild hazy bilateral parahilar lung opacities, cannot exclude mild pulmonary edema. Electronically Signed   By: JIlona SorrelM.D.   On: 10/29/2019 15:10   DG Chest Port 1 View  Result Date: 10/29/2019 CLINICAL DATA:  Fever. EXAM: PORTABLE CHEST 1 VIEW COMPARISON:  03/09/2019.  11/11/2018. FINDINGS: Interim removal of previously identified right IJ line. Mediastinum and hilar structures normal. Mild bilateral interstitial prominence. Pneumonitis cannot be excluded. No pleural effusion or pneumothorax. IMPRESSION: Mild bilateral interstitial prominence. Pneumonitis cannot be excluded. Electronically Signed   By: TMarcello Moores Register   On: 10/29/2019 10:35   DG Abd Portable 1 View  Result Date: 10/29/2019 CLINICAL DATA:  Enteric tube placement EXAM: PORTABLE ABDOMEN - 1 VIEW COMPARISON:  08/25/2017 abdominal radiograph FINDINGS: Enteric tube terminates in the proximal stomach. No dilated small bowel loops. No evidence of pneumatosis or pneumoperitoneum. Excreted contrast noted in the renal collecting systems bilaterally. IMPRESSION: Enteric tube terminates in the proximal stomach. Electronically Signed   By: JIlona SorrelM.D.   On: 10/29/2019 15:11    ____________________________________________  PROCEDURES   Procedure(s) performed (including Critical Care):  .Critical Care Performed by: IDuffy Bruce MD Authorized by: IDuffy Bruce MD   Critical care provider statement:    Critical care time (minutes):  55   Critical care time was exclusive of:  Separately billable procedures and treating other patients and teaching time   Critical care was necessary to treat or prevent imminent or life-threatening deterioration of  the following conditions:   Circulatory failure, cardiac failure and respiratory failure   Critical care was time spent personally by me on the following activities:  Development of treatment plan with patient or surrogate, discussions with consultants, evaluation of patient's response to treatment, examination of patient, obtaining history from patient or surrogate, ordering and performing treatments and interventions, ordering and review of laboratory studies, ordering and review of radiographic studies, pulse oximetry, re-evaluation of patient's condition and review of old charts   I assumed direction of critical care for this patient from another provider in my specialty: no   Procedure Name: Intubation Date/Time: 10/29/2019 5:44 PM Performed by: Duffy Bruce, MD Pre-anesthesia Checklist: Patient identified, Patient being monitored, Emergency Drugs available, Timeout performed and Suction available Oxygen Delivery Method: Non-rebreather mask Preoxygenation: Pre-oxygenation with 100% oxygen Induction Type: Rapid sequence Ventilation: Mask ventilation without difficulty Laryngoscope Size: Glidescope and 3 Grade View: Grade I Tube size: 8.0 mm Number of attempts: 1 Airway Equipment and Method: Video-laryngoscopy Placement Confirmation: ETT inserted through vocal cords under direct vision,  CO2 detector and Breath sounds checked- equal and bilateral Secured at: 25 cm Tube secured with: ETT holder Dental Injury: Teeth and Oropharynx as per pre-operative assessment  Difficulty Due To: Difficulty was unanticipated Future Recommendations: Recommend- induction with short-acting agent, and alternative techniques readily available    OG placement  Date/Time: 10/29/2019 5:45 PM Performed by: Duffy Bruce, MD Authorized by: Duffy Bruce, MD  Consent: The procedure was performed in an emergent situation. Time out: Immediately prior to procedure a "time out" was called to verify the correct patient, procedure,  equipment, support staff and site/side marked as required. Preparation: Patient was prepped and draped in the usual sterile fashion.  Sedation: Patient sedated: no  Patient tolerance: patient tolerated the procedure well with no immediate complications Comments: OG placed by myself after intubation with glidescope guidance and visualization due to difficulty placement by nursing.  .Critical Care Performed by: Duffy Bruce, MD Authorized by: Duffy Bruce, MD   Critical care provider statement:    Critical care time (minutes):  35   Critical care time was exclusive of:  Separately billable procedures and treating other patients and teaching time   Critical care was time spent personally by me on the following activities:  Development of treatment plan with patient or surrogate, discussions with consultants, evaluation of patient's response to treatment, examination of patient, obtaining history from patient or surrogate, ordering and performing treatments and interventions, ordering and review of laboratory studies, ordering and review of radiographic studies, pulse oximetry, re-evaluation of patient's condition and review of old charts   I assumed direction of critical care for this patient from another provider in my specialty: no      ____________________________________________  INITIAL IMPRESSION / MDM / [place] / ED COURSE  As part of my medical decision making, I reviewed the following data within the [place] notes reviewed and incorporated, Old chart reviewed, Notes from prior ED visits, and [place] Controlled Substance Database       *Richard [name] was evaluated in Emergency Department on 10/29/2019 for the symptoms described in the history of present illness. He was evaluated in the context of the global COVID-19 pandemic, which necessitated consideration that the patient might be at risk for infection with the SARS-CoV-2 virus that causes  COVID-19. Institutional protocols and algorithms that pertain to the evaluation of patients at risk for COVID-19 are in a state of rapid change based on information  released by regulatory bodies including the CDC and federal and state organizations. These policies and algorithms were followed during the patient's care in the ED.  Some ED evaluations and interventions may be delayed as a result of limited staffing during the pandemic.*  Clinical Course as of Oct 29 1747  Thu Oct 29, 2019  1117 64 yo M here with AMS, fever, confusion. History limited 2/2 encephalopathy. 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 Ativan, IVF, broad spectrum ABX given.    [CI]  1214 Discussed with significant other [name]. She states that pt was on drugs for the past few days, was found this morning down, confused, with vomit and diarrhea. She admits he had been out "using drugs" all night. He had appeared well yesterday, however, and did not c/o any pain or fever or cough.   [CI]  4580 Discussed case with family who is aware of his intubation. Admit to ICU for resp failure likely 2/2 aspiration, likely w/ component of EtOH w/d.   [CI]    Clinical Course User Index [CI] Duffy Bruce, MD    Medical Decision Making:  64 yo M here with sepsis 2/2 aspiration pneumonia in setting of polysubstance abuse with signs of early EtOH withdrawal. Lab work shows lactic acidosis but fortunately renal function, LFTs, WBC fairly reassuring. IVF, code sepsis initiated with broad-spectrum ABX. Given vomiting, CT A/P obtained which shows gastric distension w/o obstruction or perforation. Following fluids and sx treatment, pt remains confused, vomiting with concern for ongoing aspiration so airway protected w/ intubation. Tolerated well. Admit to ICU. Family updated.  ____________________________________________  FINAL CLINICAL IMPRESSION(S) / ED  DIAGNOSES  Final diagnoses:  Acute respiratory failure with hypoxia (HCC)  Aspiration pneumonia of both lower lobes due to gastric secretions (HCC)  Sepsis with acute hypoxic respiratory failure without septic shock, due to unspecified organism ([place])  Alcohol withdrawal syndrome, with delirium ([place])     MEDICATIONS GIVEN DURING THIS VISIT:  Medications  metoCLOPramide (REGLAN) injection 10 mg (10 mg Intravenous Not Given 10/29/19 1512)  fentaNYL (SUBLIMAZE) injection 100 mcg (100 mcg Intravenous Given 10/29/19 1455)  fentaNYL (SUBLIMAZE) injection 100 mcg (has no administration in time range)  propofol (DIPRIVAN) 1000 MG/100ML infusion (50 mcg/kg/min  58.5 kg Intravenous Rate/Dose Change 10/29/19 1500)  sodium chloride flush (NS) 0.9 % injection 3 mL (3 mLs Intravenous Not Given 10/29/19 1612)  sodium chloride flush (NS) 0.9 % injection 3 mL (has no administration in time range)  0.9 %  sodium chloride infusion (has no administration in time range)  acetaminophen (TYLENOL) tablet 650 mg (has no administration in time range)  ondansetron (ZOFRAN) injection 4 mg (has no administration in time range)  famotidine (PEPCID) IVPB 20 mg premix (0 mg Intravenous Stopped 10/29/19 1642)  ondansetron (ZOFRAN) 4 MG/2ML injection (4 mg  Given 10/29/19 1002)  sodium chloride 0.9 % bolus 1,000 mL (0 mLs Intravenous Stopped 10/29/19 1400)    And  sodium chloride 0.9 % bolus 1,000 mL (0 mLs Intravenous Stopped 10/29/19 1400)  ceFEPIme (MAXIPIME) 2 g in sodium chloride 0.9 % 100 mL IVPB (0 g Intravenous Stopped 10/29/19 1318)  metroNIDAZOLE (FLAGYL) IVPB 500 mg (0 mg Intravenous Stopped 10/29/19 1318)  vancomycin (VANCOCIN) IVPB 1000 mg/200 mL premix (0 mg Intravenous Stopped 10/29/19 1237)  acetaminophen (TYLENOL) suppository 650 mg (650 mg Rectal Given 10/29/19 1127)  LORazepam (ATIVAN) injection 2 mg (2 mg Intravenous Given 10/29/19 1007)  thiamine (  B-1) injection 100 mg (100 mg Intravenous Given 10/29/19 1127)  ondansetron  (ZOFRAN) 4 MG/2ML injection (4 mg  Given 10/29/19 1146)  acetaminophen (TYLENOL) suppository 325 mg (325 mg Rectal Given 10/29/19 1230)  iohexol (OMNIPAQUE) 300 MG/ML solution 100 mL (100 mLs Intravenous Contrast Given 10/29/19 1242)  etomidate (AMIDATE) injection 15 mg (15 mg Intravenous Given 10/29/19 1416)  succinylcholine (ANECTINE) injection 90 mg (90 mg Intravenous Given 10/29/19 1417)  midazolam (VERSED) 5 MG/5ML injection 4 mg (4 mg Intravenous Given 10/29/19 1455)     ED Discharge Orders    None       Note:  This document was prepared using Dragon voice recognition software and may include unintentional dictation errors.   Duffy Bruce, MD 10/29/19 [phone]

## 2019-10-29 NOTE — ED Notes (Signed)
Lactic acid critical value 5.3 MD Isaacs notified.

## 2019-10-29 NOTE — ED Triage Notes (Signed)
Pt here via EMS from home. Pt was found on the floor today by his family, unknown how long he has been laying there.  Ems cbg 535 and pt has fruity breath. Pt was given 569ml of fluid on route.

## 2019-10-29 NOTE — ED Notes (Signed)
MD isaacs at bedside

## 2019-10-29 NOTE — ED Notes (Signed)
RT at bedside.

## 2019-10-29 NOTE — ED Notes (Signed)
X-ray at bedside

## 2019-10-29 NOTE — ED Notes (Signed)
Md at bedside

## 2019-10-29 NOTE — ED Notes (Signed)
Pt taken to CT, will give medications once pt arrives back.

## 2019-10-29 NOTE — ED Notes (Addendum)
Pt placed on 5L [place] due to low O2 sats with good wave form, pt still at 70%, Md Isaacs notified.

## 2019-10-29 NOTE — ED Notes (Signed)
Received abx from pharmacy, abx were not correct, will call pharmacy.

## 2019-10-29 NOTE — ED Notes (Addendum)
Pt intubated by MD ET tube 26 at the lip to the right.

## 2019-10-29 NOTE — ED Notes (Signed)
Significant other called for update, will message Admit MD Kasa to provide update for her.

## 2019-10-29 NOTE — ED Notes (Addendum)
Still no medications restocked from pharmacy, [facility]inf[facility] Burn called to check on them and per pharmacy they will send up. Katie informed them that it was for our code sepsis patients. Will continue to check for  Them. Delay in giving medications due to this.

## 2019-10-29 NOTE — Progress Notes (Signed)
Pharmacy Antibiotic Note  Richard [name] is a 64 y.o. male admitted on 10/29/2019. Pharmacy has been consulted for Unasyn dosing for aspiration PNA.  Plan: Unasyn 3 g IV q6h  Height: 5\' 7"  (170.2 cm) Weight: 129 lb (58.5 kg) IBW/kg (Calculated) : 66.1  Temp (24hrs), Avg:102.4 F (39.1 C), Min:99.1 F (37.3 C), Max:102.9 F (39.4 C)  Recent Labs  Lab 10/29/19 1016  WBC 9.9  CREATININE 0.89  LATICACIDVEN 2.3*  5.3*    Estimated Creatinine Clearance: 69.4 mL/min (by C-G formula based on SCr of 0.89 mg/dL).    No Known Allergies  Antimicrobials this admission: Vanc/cefepime 1/7 x 1 Unasyn 1/7 >>  Dose adjustments this admission: NA  Microbiology results: 1/7 BCx: pending 1/7 UCx: pending  1/7 Sputum: pending  1/7 MRSA PCR: pending  Thank you for allowing pharmacy to be a part of this patient's care.  Tawnya Crook, PharmD 10/29/2019 8:08 PM

## 2019-10-29 NOTE — ED Notes (Signed)
Patient connected to vent at this time.

## 2019-10-29 NOTE — ED Notes (Addendum)
OG tube insertion successful, measures 57 at the lip. Verified by auscultation and glideoscope.  Tube is taped to ET tube.

## 2019-10-29 NOTE — ED Notes (Signed)
ET tube secured to patient's face with securement device.

## 2019-10-29 NOTE — ED Notes (Signed)
Pt tremoring at this time and unable to stay still

## 2019-10-29 NOTE — ED Notes (Signed)
Patient transported to CT 

## 2019-10-29 NOTE — ED Notes (Signed)
Pt given 90mg  succinylcholine

## 2019-10-29 NOTE — ED Notes (Signed)
Pt vomited brown x2

## 2019-10-29 NOTE — ED Notes (Signed)
Pt cleaned up and new brief placed. Pt resting at this time.

## 2019-10-29 NOTE — ED Notes (Signed)
Pt continues to bend arms, third IV started at this time.

## 2019-10-29 NOTE — ED Notes (Signed)
Pt suctioned by RT Bambi

## 2019-10-29 NOTE — ED Notes (Signed)
Pharmacy contacted again for flagyl.

## 2019-10-29 NOTE — ED Notes (Signed)
Pt's O2 still at 5, placed on 10L [place]. MD isaacs aware.

## 2019-10-29 NOTE — ED Notes (Signed)
Date and time results received: 10/29/19    Test: pO2 ven Critical Value: <31  Name of Provider Notified: Ellender Hose

## 2019-10-29 NOTE — ED Notes (Addendum)
Pt O2 sats 96% on RA.

## 2019-10-29 NOTE — ED Notes (Signed)
Pt given 15mg  etomidate

## 2019-10-29 NOTE — ED Notes (Addendum)
Pt bumped up to 10L to dry out per MD isaacs, with no improvement.  Pt placed on non-rebreather at 15L and now satting at 90%.  Instructions to set up for NG then intubation.

## 2019-10-29 NOTE — ED Notes (Addendum)
No antibiotics available in the pyxis!, Informed pharmacy that we needed them for our code sepsis pt. They state ok and they will restock them. Delay in giving medications due to this.

## 2019-10-29 NOTE — ED Notes (Signed)
Lab contacted to add on triglycerides

## 2019-10-29 NOTE — ED Notes (Signed)
Per Marya Amsler charge rn, call report to icu. Pt is to be transported to icu after 1930.

## 2019-10-29 NOTE — Progress Notes (Signed)
PHARMACY -  BRIEF ANTIBIOTIC NOTE   Pharmacy has received consult(s) for Cefepime and Vancomycin from an ED provider.  The patient's profile has been reviewed for ht/wt/allergies/indication/available labs.    One time order(s) placed for Cefepime and Vancomycin by ED provider  Further antibiotics/pharmacy consults should be ordered by admitting physician if indicated.                       Thank you, Vira Blanco 10/29/2019  10:22 AM

## 2019-10-29 NOTE — Progress Notes (Signed)
Noted initial and repeat lactic acid resulting time on Results tab appear to both be at 10:16. Sam Rayburn discussed this with bedside RN Clearence Ped. Confirmed that the first was drawn at 10:16 with final result at 11:03. Second was drawn at 12:02 with result at 12:29. This line of events is also found on ED Timeline under the Summary tab.

## 2019-10-29 NOTE — H&P (Addendum)
Name: Richard [name] MRN: [id number] DOB: 12-May-1956     CONSULTATION DATE: 10/29/2019  REFERRING MD :  Ellender Hose  CHIEF COMPLAINT: severe resp failure  HISTORY OF PRESENT ILLNESS:  Mr. Richard [name] is a 64 year old male with a past medical history notable for polysubstance abuse, CHF, hypertension, diabetes mellitus, A. fib who presents to [facility] ED on 10/29/2019 after being found on the ground, confused and altered in his house today.  It is unknown of his last normal well time.  Upon EMS arrival he was found to be febrile, tachycardic, confused.  No known seizure-like activity.  Upon arrival to the ED he continued to exhibit fever, confusion, along with tremors.  ED provider discussed with patient's significant other [name], who reported that the patient was doing drugs all last night.  While in the ED he was noted to be vomiting, so he was intubated for airway protection due to concern for ongoing aspiration.  Initial work-up in the ED revealed glucose 445, anion gap 18, high-sensitivity troponin 34, lactic acid 5.3, WBC 9.9, hemoglobin 11.4, CK 169.  Venous blood gas with pH 7.39 / CO2 42 / pO2 <31 / Bicarb 25.4.  His COVID-19 PCR is negative, influenza PCR is negative.  Urinalysis is negative for UTI, however positive for ketones.  Urine drug screen is positive for cocaine.  CT head is negative for any acute intracranial abnormality, chest x-ray with bilateral perihilar opacities concerning for possible pneumonia.  He met sepsis protocol therefore he received IV fluids and broad-spectrum antibiotics.   PCCM was contacted to admit the patient to ICU for further work-up and treatment of acute hypoxic respiratory failure in the setting of  aspiration pneumonitis secondary to acute cocaine toxicity with significant metabolic encephalopathy with seizure-like activity.  Patient also noted to be in DKA.     ED course: 64 yo male with acute and and severe resp failure Pt was found on the floor today by his family,  unknown how long he has been laying there.  Ems cbg 535 and pt has fruity breath. Pt was given 555m of fluid on route. Patient with severe hypoxia and severe resp failure Patient was emergently intubated and placed on VENT +VOMITNING AND SEIZURE LIKE ACTIVITY CT head no acute process COVID NEG +FEBRILE +COCAINE ABUSE  Patient is critically ill    STUDIES/SIGNIFICANT EVENTS: 1/7-CT head without contrast>>1. Stable mild atrophy with periventricular small vessel disease. No acute infarct. No mass or hemorrhage. 2.  Foci of arterial vascular calcification noted. 3.  Multiple foci of paranasal sinus disease. 1/7-CT abdomen and pelvis>>1. Patchy right lower lobe airspace disease and right middle lobe airspace disease concerning for pneumonia. 2. Gastric air-fluid level without evidence of definite gastric outlet obstruction. This appearance can be seen with dysmotility/gastroparesis. 3.  Aortic Atherosclerosis  CULTURES: SARS-CoV-2 PCR 1/7>> negative Influenza PCR 1/7>> negative Blood culture x2 1/7>> Urine 1/7>>  ANTIBIOTICS: Cefepime x1 dose in ED 1/7 Flagyl x1 dose in ED 1/7 Vancomycin x1 dose in ED 1/7 Unasyn 1/7>>    Vent Mode: AC FiO2 (%):  [30 %-50 %] 50 % Set Rate:  [16 bmp] 16 bmp Vt Set:  [450 mL] 450 mL PEEP:  [8 cmH20] 8 cmH20   CBC    Component Value Date/Time   WBC 9.9 10/29/2019 1016   RBC 3.94 (L) 10/29/2019 1016   HGB 11.4 (L) 10/29/2019 1016   HCT 34.7 (L) 10/29/2019 1016   PLT 292 10/29/2019 1016   MCV 88.1 10/29/2019 1016  MCH 28.9 10/29/2019 1016   MCHC 32.9 10/29/2019 1016   RDW 13.6 10/29/2019 1016   LYMPHSABS 1.6 10/29/2019 1016   MONOABS 0.5 10/29/2019 1016   EOSABS 0.0 10/29/2019 1016   BASOSABS 0.0 10/29/2019 1016   BMP Latest Ref Rng & Units 10/29/2019 07/01/2019 03/16/2019  Glucose 70 - 99 mg/dL 445(H) - 151(H)  BUN 8 - 23 mg/dL _0 Creatinine 0.61 - 1.24 mg/dL 0.89 0.88 0.69  Sodium 135 - 145 mmol/L 140 - 136  Potassium  3.5 - 5.1 mmol/L 4.0 - 4.1  Chloride 98 - 111 mmol/L 99 - 102  CO2 22 - 32 mmol/L 23 - 25  Calcium 8.9 - 10.3 mg/dL 9.3 - 8.0(L)     PAST MEDICAL HISTORY :   has a past medical history of Atrial fibrillation ([place]), Cancer ([place]), CHF (congestive heart failure) ([place]), Chronic back pain, Chronic leg pain, Coronary artery disease, Diabetes mellitus without complication ([place]), Hypertension, and Neuropathy.  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 Prior to Admission medications   Medication Sig Start Date End Date Taking? Authorizing Provider  insulin aspart (NOVOLOG) 100 UNIT/ML injection Inject 0-5 Units into the skin at bedtime. CBG < 70: implement hypoglycemia protocol CBG 70 - 120: 0 units CBG 121 - 150: 0 units CBG 151 - 200: 0 units CBG 201 - 250: 2 units CBG 251 - 300: 3 units CBG 301 - 350: 4 units CBG 351 - 400: 5 units CBG > 400: call MD 11/18/18  Yes Dustin Flock, MD  insulin aspart (NOVOLOG) 100 UNIT/ML injection Inject 10 Units into the skin 3 (three) times daily with meals. 03/18/19  Yes Bettey Costa, MD  Multiple Vitamin (MULTIVITAMIN WITH MINERALS) TABS tablet Take 1 tablet by mouth daily. 01/12/18  Yes Gouru, Illene Silver, MD  ACCU-CHEK AVIVA PLUS test strip U ONCE TO BID UTD 09/23/18   Nicholes Mango, MD  ACCU-CHEK SOFTCLIX LANCETS lancets TEST 1-2 XD 09/23/18   Gouru, Illene Silver, MD  acetaminophen (TYLENOL) 325 MG tablet Take 1 tablet (325 mg total) by mouth every 6 (six) hours as needed for mild pain (or Fever >/= 101). 01/11/18   Gouru, Aruna, MD  apixaban (ELIQUIS) 5 MG TABS tablet Take 1 tablet (5 mg total) by mouth 2 (two) times daily. Patient not  taking: Reported on 10/29/2019 09/23/18   Nicholes Mango, MD  Blood Glucose Monitoring Suppl (ACCU-CHEK AVIVA PLUS) w/Device KIT U TO TEST ONCE TO BID 09/23/18   Gouru, Illene Silver, MD  carvedilol (COREG) 3.125 MG tablet Take 1 tablet (3.125 mg total) by mouth 2 (two) times daily with a meal. Patient not taking: Reported on 10/29/2019 03/18/19   Bettey Costa, MD  doxycycline (VIBRAMYCIN) 100 MG capsule Take 1 capsule (100 mg total) by mouth 2 (two) times daily. Patient not taking: Reported on 08/11/2019 07/15/19   Kris Hartmann, NP  Ensure Max Protein (ENSURE MAX PROTEIN) LIQD Take 330 mLs (11 oz total) by mouth 2 (two) times daily between meals. Patient not taking: Reported on 10/29/2019 03/18/19   Bettey Costa, MD  gabapentin (NEURONTIN) 100 MG capsule Take 1 capsule (100 mg total) by mouth 3 (three) times daily. Patient taking differently: Take 100 mg by mouth daily as needed.  08/04/18   Salary, Avel Peace, MD  insulin glargine (  LANTUS) 100 UNIT/ML injection Inject 0.19 mLs (19 Units total) into the skin daily. Patient not taking: Reported on 10/29/2019 03/18/19   Bettey Costa, MD  Needles & Syringes MISC Use as directed Patient not taking: Reported on 10/29/2019 09/23/18   Nicholes Mango, MD  oxyCODONE-acetaminophen (PERCOCET/ROXICET) 5-325 MG tablet Take 1-2 tablets by mouth every 4 (four) hours as needed for severe pain. Patient not taking: Reported on 10/29/2019 07/15/19   Kris Hartmann, NP  pantoprazole (PROTONIX) 40 MG tablet Take 1 tablet (40 mg total) by mouth daily. Patient not taking: Reported on 10/29/2019 03/18/19   Bettey Costa, MD  rosuvastatin (CRESTOR) 40 MG tablet Take 1 tablet (40 mg total) by mouth daily at 6 PM. Patient not taking: Reported on 10/29/2019 03/18/19   Bettey Costa, MD  ticagrelor (BRILINTA) 90 MG TABS tablet Take 1 tablet (90 mg total) by mouth 2 (two) times daily. Patient not taking: Reported on 10/29/2019 03/18/19   Bettey Costa, MD   No Known Allergies  FAMILY HISTORY:  family history  includes CAD in his mother; Diabetes Mellitus II in his father and mother. SOCIAL HISTORY:  reports that he has been smoking. He has been smoking about 0.50 packs per day. He has never used smokeless tobacco. He reports current drug use. Drug: Cocaine. He reports that he does not drink alcohol.  REVIEW OF SYSTEMS:   Unable to obtain due to critical illness   VITAL SIGNS: Temp:  [99.1 F (37.3 C)-102.8 F (39.3 C)] 102.7 F (39.3 C) (01/07 1600) Pulse Rate:  [122-140] 122 (01/07 1600) Resp:  [13-25] 25 (01/07 1600) BP: (105-215)/(67-129) 116/95 (01/07 1600) SpO2:  [64 %-100 %] 91 % (01/07 1600) FiO2 (%):  [30 %] 30 % (01/07 1430) Weight:  [58.5 kg] 58.5 kg (01/07 0939)   No intake/output data recorded. Total I/O In: 2001.6 [I.V.:1.6; IV Piggyback:2000] Out: -    SpO2: 91 % FiO2 (%): 30 %   Physical Examination:  GENERAL:critically ill appearing, intubated and sedated HEAD: Normocephalic, atraumatic EYES: Pupils equal, round, reactive to light.  No scleral icterus.  MOUTH: Moist mucosal membrane. NECK: Supple. No JVD.  PULMONARY: Coarse breath sounds bilaterally, vent assisted, even, nonlabored CARDIOVASCULAR: Tachycardia, irregularly irregular rhythm GASTROINTESTINAL: Soft, nontender, -distended.  Positive bowel sounds.  MUSCULOSKELETAL: No swelling, clubbing, or edema. Right AKA. NEUROLOGIC: obtunded SKIN:intact,warm,dry. No obvious rashes, lesions, or ulcerations  I personally reviewed lab work that was obtained in last 24 hrs. CXR Independently reviewed-b/l interstitial infiltrates,edema,pneumonia  MEDICATIONS: I have reviewed all medications and confirmed regimen as documented   CULTURE RESULTS   Recent Results (from the past 240 hour(s))  Respiratory Panel by RT PCR (Flu A&B, Covid) - Nasopharyngeal Swab     Status: None   Collection Time: 10/29/19 10:16 AM   Specimen: Nasopharyngeal Swab  Result Value Ref Range Status   SARS Coronavirus 2 by RT PCR  NEGATIVE NEGATIVE Final    Comment: (NOTE) SARS-CoV-2 target nucleic acids are NOT DETECTED. The SARS-CoV-2 RNA is generally detectable in upper respiratoy specimens during the acute phase of infection. The lowest concentration of SARS-CoV-2 viral copies this assay can detect is 131 copies/mL. A negative result does not preclude SARS-Cov-2 infection and should not be used as the sole basis for treatment or other patient management decisions. A negative result may occur with  improper specimen collection/handling, submission of specimen other than nasopharyngeal swab, presence of viral mutation(s) within the areas targeted by this assay, and inadequate number of viral copies (<131 copies/mL).  A negative result must be combined with clinical observations, patient history, and epidemiological information. The expected result is Negative. Fact Sheet for Patients:  PinkCheek.be Fact Sheet for Healthcare Providers:  GravelBags.it This test is not yet ap proved or cleared by the [place] FDA and  has been authorized for detection and/or diagnosis of SARS-CoV-2 by FDA under an Emergency Use Authorization (EUA). This EUA will remain  in effect (meaning this test can be used) for the duration of the COVID-19 declaration under Section 564(b)(1) of the Act, 21 U.S.C. section 360bbb-3(b)(1), unless the authorization is terminated or revoked sooner.    Influenza A by PCR NEGATIVE NEGATIVE Final   Influenza B by PCR NEGATIVE NEGATIVE Final    Comment: (NOTE) The Xpert Xpress SARS-CoV-2/FLU/RSV assay is intended as an aid in  the diagnosis of influenza from Nasopharyngeal swab specimens and  should not be used as a sole basis for treatment. Nasal washings and  aspirates are unacceptable for Xpert Xpress SARS-CoV-2/FLU/RSV  testing. Fact Sheet for Patients: PinkCheek.be Fact Sheet for Healthcare  Providers: GravelBags.it This test is not yet approved or cleared by the [place] FDA and  has been authorized for detection and/or diagnosis of SARS-CoV-2 by  FDA under an Emergency Use Authorization (EUA). This EUA will remain  in effect (meaning this test can be used) for the duration of the  Covid-19 declaration under Section 564(b)(1) of the Act, 21  U.S.C. section 360bbb-3(b)(1), unless the authorization is  terminated or revoked. Performed at [facility], [address] [place], [place] [address]           IMAGING    CT Head Wo Contrast  Result Date: 10/29/2019 CLINICAL DATA:  Unwitnessed fall with altered mental status EXAM: CT HEAD WITHOUT CONTRAST TECHNIQUE: Contiguous axial images were obtained from the base of the skull through the vertex without intravenous contrast. COMPARISON:  Feb 28, 2019 FINDINGS: Brain: Slight diffuse atrophy is stable. Prominence of the cisterna magna is an apparent anatomic variant. There is no intracranial mass, hemorrhage, extra-axial fluid collection, or midline shift. There is small vessel disease in the centra semiovale bilaterally, stable. No evident acute infarct. A small focus of basal ganglia calcification on the right is likely physiologic. Vascular: There is no hyperdense vessel. There are foci of calcification in each carotid siphon region. Skull: The bony calvarium appears intact. Sinuses/Orbits: There is opacification in multiple ethmoid air cells. There is opacification in the anterior, lateral left sphenoid sinus. Other visualized paranasal sinuses are clear. Visualized orbits appear symmetric bilaterally. Other: Visualized mastoid air cells are clear. IMPRESSION: 1. Stable mild atrophy with periventricular small vessel disease. No acute infarct. No mass or hemorrhage. 2.  Foci of arterial vascular calcification noted. 3.  Multiple foci of paranasal sinus disease. Electronically Signed   By: Lowella Grip III M.D.   On: 10/29/2019 10:45   CT ABDOMEN PELVIS W CONTRAST  Result Date: 10/29/2019 CLINICAL DATA:  Acute abdominal pain, neutropenia EXAM: CT ABDOMEN AND PELVIS WITH CONTRAST TECHNIQUE: Multidetector CT imaging of the abdomen and pelvis was performed using the standard protocol following bolus administration of intravenous contrast. CONTRAST:  122m OMNIPAQUE IOHEXOL 300 MG/ML  SOLN COMPARISON:  09/05/2017 FINDINGS: Lower chest: Patchy right lower lobe airspace disease and right middle lobe airspace disease concerning for pneumonia. Hepatobiliary: No focal liver abnormality is seen. No gallstones, gallbladder wall thickening, or biliary dilatation. Pancreas: Unremarkable. No pancreatic ductal dilatation or surrounding inflammatory changes. Spleen: Prior splenectomy. Adrenals/Urinary Tract: Adrenal  glands are unremarkable. Kidneys are normal, without renal calculi, focal lesion, or hydronephrosis. Bladder is unremarkable. Stomach/Bowel: Gastric distension with an air-fluid level. Appendix appears normal. No evidence of bowel wall thickening, distention, or inflammatory changes. Vascular/Lymphatic: Normal caliber abdominal aorta with mild atherosclerosis. No lymphadenopathy. Reproductive: Prostate is unremarkable. Other: No abdominal wall hernia or abnormality. No abdominopelvic ascites. Musculoskeletal: No acute osseous abnormality. No aggressive osseous lesion. IMPRESSION: 1. Patchy right lower lobe airspace disease and right middle lobe airspace disease concerning for pneumonia. 2. Gastric air-fluid level without evidence of definite gastric outlet obstruction. This appearance can be seen with dysmotility/gastroparesis. 3.  Aortic Atherosclerosis (ICD10-I70.0). Electronically Signed   By: Kathreen Devoid   On: 10/29/2019 13:28   DG Chest Portable 1 View  Result Date: 10/29/2019 CLINICAL DATA:  Intubated EXAM: PORTABLE CHEST 1 VIEW COMPARISON:  Chest radiograph from earlier today. FINDINGS:  Endotracheal tube tip is 3.2 cm above the carina. Enteric tube terminates in the proximal stomach. Stable cardiomediastinal silhouette with normal heart size. No pneumothorax. No pleural effusion. Mild hazy bilateral parahilar lung opacities appear slightly worsened. Pacer pad overlies the heart and left chest. IMPRESSION: 1. Well-positioned endotracheal and enteric tubes. 2. Slightly increased mild hazy bilateral parahilar lung opacities, cannot exclude mild pulmonary edema. Electronically Signed   By: Ilona Sorrel M.D.   On: 10/29/2019 15:10   DG Chest Port 1 View  Result Date: 10/29/2019 CLINICAL DATA:  Fever. EXAM: PORTABLE CHEST 1 VIEW COMPARISON:  03/09/2019.  11/11/2018. FINDINGS: Interim removal of previously identified right IJ line. Mediastinum and hilar structures normal. Mild bilateral interstitial prominence. Pneumonitis cannot be excluded. No pleural effusion or pneumothorax. IMPRESSION: Mild bilateral interstitial prominence. Pneumonitis cannot be excluded. Electronically Signed   By: Marcello Moores  Register   On: 10/29/2019 10:35   DG Abd Portable 1 View  Result Date: 10/29/2019 CLINICAL DATA:  Enteric tube placement EXAM: PORTABLE ABDOMEN - 1 VIEW COMPARISON:  08/25/2017 abdominal radiograph FINDINGS: Enteric tube terminates in the proximal stomach. No dilated small bowel loops. No evidence of pneumatosis or pneumoperitoneum. Excreted contrast noted in the renal collecting systems bilaterally. IMPRESSION: Enteric tube terminates in the proximal stomach. Electronically Signed   By: Ilona Sorrel M.D.   On: 10/29/2019 15:11        Indwelling Urinary Catheter continued, requirement due to   Reason to continue Indwelling Urinary Catheter strict Intake/Output monitoring for hemodynamic instability         Ventilator continued, requirement due to severe respiratory failure   Ventilator Sedation RASS 0 to -2      ASSESSMENT AND PLAN SYNOPSIS 64 year old male with acute and severe hypoxic  respiratory failure from aspiration pneumonitis in the setting of acute cocaine toxicity with significant metabolic encephalopathy with seizure-like activity.  Also noted to be in DKA.  Severe ACUTE Hypoxic and Hypercapnic Respiratory Failure -continue Full MV support -continue Bronchodilator Therapy -Wean Fio2 and PEEP as tolerated -Follow intermittent chest x-ray and ABG as needed -Spontaneous breathing trial when respiratory parameters met   Acute aspiration pneumonitis -Monitor fever curve -Trend WBCs and procalcitonin -Follow cultures as above -Received cefepime, Flagyl, vancomycin in the ED -We will place on Unasyn for now   Acute Metabolic Encephalopathy in setting of Cocaine abuse and sepsis Seizure like activity Concern for impending ETOH withdrawal - intubated and sedated - minimal sedation to achieve a RASS goal: -1 -Propofol gtt to maintain RASS goal -Daily wake up assessment -Provide supportive care -CIWA protocol -Thiamine, folic acid, MVI -Obtain EEG -Consider MRI  and neurology consultation  DKA Hx: Diabetes mellitus -Follow DKA protocol -IV fluids -Insulin drip -Follow BMP every 4 hours -Once anion gap closed and serum bicarb greater than 20 can convert to sliding scale insulin and long-acting insulin -Consult diabetes coordinator, appreciate input -Check hemoglobin A1c   CARDIAC Tachycardia in setting of fever Atrial Fibrillation Hx: HTN, CHF, CAD, Atrial Fibrillation -ICU monitoring -Maintain MAP >65 -IV Fluids -Neo-synephrine if needed to maintain MAP goal -CHA2DS2-VASc score  3 (high risk) ~ Heparin gtt for anticoagulation, consult pharmacy  GI -GI PROPHYLAXIS as indicated  NUTRITIONAL STATUS DIET-->TF's as tolerated Constipation protocol as indicated   ELECTROLYTES -follow labs as needed -replace as needed -pharmacy consultation and following   DVT/GI PRX ordered TRANSFUSIONS AS NEEDED MONITOR FSBS ASSESS the need for LABS      Overall, patient is critically ill, prognosis is guarded.  Patient with Multiorgan failure and at high risk for cardiac arrest and death.   Family unavailable at this time Patient with very poor prognosis at this time         Disposition: ICU. Goals of care: Full code VTE prophylaxis/Anticoagulation: Heparin gtt Stress ulcer prophylaxis: IV Pepcid Updates: No family available for update   Darel Hong, [facility] Pager: [phone]

## 2019-10-29 NOTE — ED Notes (Signed)
Pt placed in clean brief and repositioned at this time.

## 2019-10-29 NOTE — ED Notes (Addendum)
Pt placed on 2L [place], md isaacs aware. Pt satting at 65%, pt bumped up to 5L with no improvement.

## 2019-10-29 NOTE — ED Notes (Signed)
Admit MD Kasa message and asked to update significant other on pt's current status.

## 2019-10-29 NOTE — ED Notes (Signed)
MD Kasa notified of pt's rising temperature. Current temp is 102.8 via temp foley.

## 2019-10-29 NOTE — ED Notes (Signed)
Pt vomiting brown

## 2019-10-29 NOTE — ED Notes (Signed)
Pt bagged by RT bambi

## 2019-10-29 NOTE — ED Notes (Signed)
Placement of ET tube verified by color change and Md isaacs

## 2019-10-29 NOTE — ED Notes (Signed)
Several attempts for NG and OG unsuccessful.

## 2019-10-29 NOTE — ED Notes (Signed)
Md isaacs attempting intubation

## 2019-10-29 NOTE — ED Notes (Signed)
Pt cleaned up at this time, pt found to be covered in stool. At this time patient also vomited x1 brown.

## 2019-10-29 NOTE — ED Notes (Signed)
Md isaacs attempting OG at this time.

## 2019-10-29 NOTE — ED Notes (Addendum)
Pt placed in clean gown and clean sheets at this time. Pt placed in clean brief. Pt had small bowel movement. Pt also vomited large amount x1.

## 2019-10-29 NOTE — ED Notes (Signed)
Pt taken to CT.

## 2019-10-29 NOTE — ED Notes (Addendum)
Pads connected to zoll placed on pt, pt on cardiac leads, O2 monitoring and BP cuff. Suction set up at bedside. Respiratory called. Medications drawn up and ready.

## 2019-10-30 ENCOUNTER — Inpatient Hospital Stay: Payer: Medicare Other

## 2019-10-30 DIAGNOSIS — J9601 Acute respiratory failure with hypoxia: Secondary | ICD-10-CM

## 2019-10-30 DIAGNOSIS — F10231 Alcohol dependence with withdrawal delirium: Secondary | ICD-10-CM

## 2019-10-30 DIAGNOSIS — R569 Unspecified convulsions: Secondary | ICD-10-CM

## 2019-10-30 DIAGNOSIS — F10931 Alcohol use, unspecified with withdrawal delirium: Secondary | ICD-10-CM

## 2019-10-30 LAB — BASIC METABOLIC PANEL
Anion gap: 11 (ref 5–15)
Anion gap: 6 (ref 5–15)
Anion gap: 8 (ref 5–15)
BUN: 19 mg/dL (ref 8–23)
BUN: 20 mg/dL (ref 8–23)
BUN: 20 mg/dL (ref 8–23)
CO2: 23 mmol/L (ref 22–32)
CO2: 26 mmol/L (ref 22–32)
CO2: 24 mmol/L (ref 22–32)
Calcium: 8.4 mg/dL — ABNORMAL LOW (ref 8.9–10.3)
Calcium: 8.1 mg/dL — ABNORMAL LOW (ref 8.9–10.3)
Calcium: 8.4 mg/dL — ABNORMAL LOW (ref 8.9–10.3)
Chloride: 109 mmol/L (ref 98–111)
Chloride: 107 mmol/L (ref 98–111)
Chloride: 107 mmol/L (ref 98–111)
Creatinine, Ser: 0.89 mg/dL (ref 0.61–1.24)
Creatinine, Ser: 0.86 mg/dL (ref 0.61–1.24)
Creatinine, Ser: 0.91 mg/dL (ref 0.61–1.24)
GFR calc Af Amer: >60 mL/min (ref >60)
GFR calc Af Amer: >60 mL/min (ref >60)
GFR calc Af Amer: >60 mL/min (ref >60)
GFR calc non Af Amer: >60 mL/min (ref >60)
GFR calc non Af Amer: >60 mL/min (ref >60)
GFR calc non Af Amer: >60 mL/min (ref >60)
Glucose, Bld: 113 mg/dL — ABNORMAL HIGH (ref 70–99)
Glucose, Bld: 148 mg/dL — ABNORMAL HIGH (ref 70–99)
Glucose, Bld: 109 mg/dL — ABNORMAL HIGH (ref 70–99)
Potassium: 5 mmol/L (ref 3.5–5.1)
Potassium: 3.2 mmol/L — ABNORMAL LOW (ref 3.5–5.1)
Potassium: 3.5 mmol/L (ref 3.5–5.1)
Sodium: 143 mmol/L (ref 135–145)
Sodium: 139 mmol/L (ref 135–145)
Sodium: 139 mmol/L (ref 135–145)

## 2019-10-30 LAB — BLOOD GAS, ARTERIAL
Acid-Base Excess: 1.5 mmol/L (ref 0.0–2.0)
Bicarbonate: 25.9 mmol/L (ref 20.0–28.0)
FIO2: 0.4
MECHVT: 450 mL
Mechanical Rate: 16
O2 Saturation: 97 %
PEEP: 5 cmH2O
Patient temperature: 37
pCO2 arterial: 39 mmHg (ref 32.0–48.0)
pH, Arterial: 7.43 (ref 7.350–7.450)
pO2, Arterial: 88 mmHg (ref 83.0–108.0)

## 2019-10-30 LAB — CBC
HCT: 31.1 % — ABNORMAL LOW (ref 39.0–52.0)
Hemoglobin: 10.1 g/dL — ABNORMAL LOW (ref 13.0–17.0)
MCH: 28.9 pg (ref 26.0–34.0)
MCHC: 32.5 g/dL (ref 30.0–36.0)
MCV: 88.9 fL (ref 80.0–100.0)
Platelets: 225 10*3/uL (ref 150–400)
RBC: 3.5 MIL/uL — ABNORMAL LOW (ref 4.22–5.81)
RDW: 14.3 % (ref 11.5–15.5)
WBC: 11.1 10*3/uL — ABNORMAL HIGH (ref 4.0–10.5)
nRBC: 0 % (ref 0.0–0.2)

## 2019-10-30 LAB — URINE CULTURE: Culture: NO GROWTH

## 2019-10-30 LAB — GLUCOSE, CAPILLARY
Glucose-Capillary: 101 mg/dL — ABNORMAL HIGH (ref 70–99)
Glucose-Capillary: 103 mg/dL — ABNORMAL HIGH (ref 70–99)
Glucose-Capillary: 142 mg/dL — ABNORMAL HIGH (ref 70–99)
Glucose-Capillary: 152 mg/dL — ABNORMAL HIGH (ref 70–99)
Glucose-Capillary: 169 mg/dL — ABNORMAL HIGH (ref 70–99)
Glucose-Capillary: 175 mg/dL — ABNORMAL HIGH (ref 70–99)
Glucose-Capillary: 187 mg/dL — ABNORMAL HIGH (ref 70–99)
Glucose-Capillary: 199 mg/dL — ABNORMAL HIGH (ref 70–99)
Glucose-Capillary: 66 mg/dL — ABNORMAL LOW (ref 70–99)
Glucose-Capillary: 66 mg/dL — ABNORMAL LOW (ref 70–99)
Glucose-Capillary: 70 mg/dL (ref 70–99)
Glucose-Capillary: 77 mg/dL (ref 70–99)
Glucose-Capillary: 98 mg/dL (ref 70–99)
Glucose-Capillary: 98 mg/dL (ref 70–99)

## 2019-10-30 LAB — HEPARIN LEVEL (UNFRACTIONATED): Heparin Unfractionated: 0.64 IU/mL (ref 0.30–0.70)

## 2019-10-30 LAB — BRAIN NATRIURETIC PEPTIDE: B Natriuretic Peptide: 135 pg/mL — ABNORMAL HIGH (ref 0.0–100.0)

## 2019-10-30 LAB — HEMOGLOBIN A1C
Hgb A1c MFr Bld: 14.9 % — ABNORMAL HIGH (ref 4.8–5.6)
Mean Plasma Glucose: 380.93 mg/dL

## 2019-10-30 LAB — PROCALCITONIN: Procalcitonin: 7.81 ng/mL

## 2019-10-30 MED ORDER — VECURONIUM BROMIDE 10 MG IV SOLR: 10.0000 mg | INTRAVENOUS | Status: DC | PRN

## 2019-10-30 MED ORDER — HEPARIN (PORCINE) 25000 UT/250ML-% IV SOLN
750.0000 [IU]/h | INTRAVENOUS | Status: DC
  Administered 2019-10-30: 750 [IU]/h via INTRAVENOUS
  Filled 2019-10-30: qty 250

## 2019-10-30 MED ORDER — HEPARIN BOLUS VIA INFUSION
4000.0000 [IU] | Freq: Once | INTRAVENOUS | Status: AC
  Administered 2019-10-30: 4000 [IU] via INTRAVENOUS
  Filled 2019-10-30: qty 4000

## 2019-10-30 MED ORDER — INSULIN GLARGINE 100 UNIT/ML ~~LOC~~ SOLN
10.0000 [IU] | Freq: Every day | SUBCUTANEOUS | Status: DC
  Administered 2019-10-30 – 2019-11-01 (×3): 10 [IU] via SUBCUTANEOUS
  Filled 2019-10-30 (×4): qty 0.1

## 2019-10-30 MED ORDER — LACTATED RINGERS IV BOLUS
1000.0000 mL | Freq: Once | INTRAVENOUS | Status: AC
  Administered 2019-10-30: 1000 mL via INTRAVENOUS

## 2019-10-30 MED ORDER — DEXTROSE 50 % IV SOLN
25.0000 mL | Freq: Once | INTRAVENOUS | Status: AC
  Administered 2019-10-30: 25 mL via INTRAVENOUS
  Filled 2019-10-30: qty 50

## 2019-10-30 MED ORDER — INSULIN GLARGINE 100 UNIT/ML ~~LOC~~ SOLN: 10.0000 [IU] | Freq: Every day | SUBCUTANEOUS | Status: DC

## 2019-10-30 MED ORDER — FENTANYL CITRATE (PF) 100 MCG/2ML IJ SOLN
100.0000 ug | INTRAMUSCULAR | Status: DC | PRN
  Administered 2019-10-31 – 2019-11-01 (×2): 100 ug via INTRAVENOUS
  Filled 2019-10-30 (×2): qty 2

## 2019-10-30 MED ORDER — MIDAZOLAM HCL 2 MG/2ML IJ SOLN
2.0000 mg | INTRAMUSCULAR | Status: DC | PRN
  Administered 2019-10-30: 2 mg via INTRAVENOUS
  Administered 2019-10-31 (×4): 4 mg via INTRAVENOUS
  Administered 2019-10-31: 10:00:00 2 mg via INTRAVENOUS
  Filled 2019-10-30: qty 4
  Filled 2019-10-30: qty 2
  Filled 2019-10-30 (×2): qty 4
  Filled 2019-10-30 (×2): qty 2
  Filled 2019-10-30: qty 4

## 2019-10-30 MED ORDER — APIXABAN 5 MG PO TABS
5.0000 mg | ORAL_TABLET | Freq: Two times a day (BID) | ORAL | Status: DC
  Administered 2019-10-30 – 2019-11-04 (×11): 5 mg via ORAL
  Filled 2019-10-30 (×11): qty 1

## 2019-10-30 MED ORDER — POTASSIUM CHLORIDE 20 MEQ PO PACK
40.0000 meq | PACK | Freq: Once | ORAL | Status: AC
  Administered 2019-10-30: 18:00:00 40 meq
  Filled 2019-10-30: qty 2

## 2019-10-30 MED ORDER — INSULIN GLARGINE 100 UNIT/ML ~~LOC~~ SOLN: 16.0000 [IU] | Freq: Every day | SUBCUTANEOUS | Status: DC

## 2019-10-30 MED ORDER — INSULIN ASPART 100 UNIT/ML ~~LOC~~ SOLN
0.0000 [IU] | SUBCUTANEOUS | Status: DC
  Administered 2019-10-30: 08:00:00 2 [IU] via SUBCUTANEOUS
  Administered 2019-10-31 (×2): 1 [IU] via SUBCUTANEOUS
  Filled 2019-10-30 (×4): qty 1

## 2019-10-30 MED ORDER — FAMOTIDINE 20 MG PO TABS
20.0000 mg | ORAL_TABLET | Freq: Two times a day (BID) | ORAL | Status: DC
  Administered 2019-10-30 – 2019-11-01 (×4): 20 mg
  Filled 2019-10-30 (×4): qty 1

## 2019-10-30 NOTE — Progress Notes (Signed)
ANTICOAGULATION CONSULT NOTE - Initial Consult  Pharmacy Consult for Heparin Indication: atrial fibrillation  No Known Allergies  Patient Measurements: Height: '5\' 7"'$  (170.2 cm) Weight: 117 lb 15.1 oz (53.5 kg) IBW/kg (Calculated) : 66.1 HEPARIN DW (KG): 53.5  Vital Signs: Temp: 101 F (38.3 C) (01/08 0000) Temp Source: Bladder (01/08 0000) BP: 86/58 (01/08 0200) Pulse Rate: 87 (01/08 0200)  Labs: Recent Labs    10/29/19 1015 10/29/19 1016 10/29/19 1156 10/29/19 2110  HGB  --  11.4*  --   --   HCT  --  34.7*  --   --   PLT  --  292  --   --   APTT  --  25  --   --   LABPROT  --  13.9  --   --   INR  --  1.1  --   --   CREATININE  --  0.89  --  1.05  CKTOTAL  --   --  169  --   TROPONINIHS 34* 29*  --   --    Estimated Creatinine Clearance: 53.8 mL/min (by C-G formula based on SCr of 1.05 mg/dL).  Medical History: Past Medical History:  Diagnosis Date  . Atrial fibrillation ([place])    per patient  . Cancer ([place])   . CHF (congestive heart failure) ([place])   . Chronic back pain   . Chronic leg pain   . Coronary artery disease   . Diabetes mellitus without complication ([place])   . Hypertension   . Neuropathy     Medications:  Medications Prior to Admission  Medication Sig Dispense Refill Last Dose  . insulin aspart (NOVOLOG) 100 UNIT/ML injection Inject 0-5 Units into the skin at bedtime. CBG < 70: implement hypoglycemia protocol CBG 70 - 120: 0 units CBG 121 - 150: 0 units CBG 151 - 200: 0 units CBG 201 - 250: 2 units CBG 251 - 300: 3 units CBG 301 - 350: 4 units CBG 351 - 400: 5 units CBG > 400: call MD 10 mL 11   . insulin aspart (NOVOLOG) 100 UNIT/ML injection Inject 10 Units into the skin 3 (three) times daily with meals. 10 mL 11   . Multiple Vitamin (MULTIVITAMIN WITH MINERALS) TABS tablet Take 1 tablet by mouth daily. 30 tablet 1   . ACCU-CHEK AVIVA PLUS test strip U ONCE TO BID UTD 100 each 0   . ACCU-CHEK SOFTCLIX LANCETS lancets TEST 1-2 XD 100  each 0   . acetaminophen (TYLENOL) 325 MG tablet Take 1 tablet (325 mg total) by mouth every 6 (six) hours as needed for mild pain (or Fever >/= 101). 30 tablet 0 PRN at PRN  . apixaban (ELIQUIS) 5 MG TABS tablet Take 1 tablet (5 mg total) by mouth 2 (two) times daily. (Patient not taking: Reported on 10/29/2019) 60 tablet 0 Not Taking at Unknown time  . Blood Glucose Monitoring Suppl (ACCU-CHEK AVIVA PLUS) w/Device KIT U TO TEST ONCE TO BID 1 kit 1   . carvedilol (COREG) 3.125 MG tablet Take 1 tablet (3.125 mg total) by mouth 2 (two) times daily with a meal. (Patient not taking: Reported on 10/29/2019)   Not Taking at Unknown time  . doxycycline (VIBRAMYCIN) 100 MG capsule Take 1 capsule (100 mg total) by mouth 2 (two) times daily. (Patient not taking: Reported on 08/11/2019) 20 capsule 0 Not Taking at Unknown time  . Ensure Max Protein (ENSURE MAX PROTEIN) LIQD Take 330 mLs (11 oz total)  by mouth 2 (two) times daily between meals. (Patient not taking: Reported on 10/29/2019)   Not Taking at Unknown time  . gabapentin (NEURONTIN) 100 MG capsule Take 1 capsule (100 mg total) by mouth 3 (three) times daily. (Patient taking differently: Take 100 mg by mouth daily as needed. ) 180 capsule 3 PRN at PRN  . insulin glargine (LANTUS) 100 UNIT/ML injection Inject 0.19 mLs (19 Units total) into the skin daily. (Patient not taking: Reported on 10/29/2019) 10 mL 11 Not Taking at Unknown time  . Needles & Syringes MISC Use as directed (Patient not taking: Reported on 10/29/2019) 100 each 3 Not Taking at Unknown time  . oxyCODONE-acetaminophen (PERCOCET/ROXICET) 5-325 MG tablet Take 1-2 tablets by mouth every 4 (four) hours as needed for severe pain. (Patient not taking: Reported on 10/29/2019) 40 tablet 0 Not Taking at Unknown time  . pantoprazole (PROTONIX) 40 MG tablet Take 1 tablet (40 mg total) by mouth daily. (Patient not taking: Reported on 10/29/2019) 30 tablet 0 Not Taking at Unknown time  . rosuvastatin (CRESTOR) 40 MG  tablet Take 1 tablet (40 mg total) by mouth daily at 6 PM. (Patient not taking: Reported on 10/29/2019)   Not Taking at Unknown time  . ticagrelor (BRILINTA) 90 MG TABS tablet Take 1 tablet (90 mg total) by mouth 2 (two) times daily. (Patient not taking: Reported on 10/29/2019) 60 tablet  Not Taking at Unknown time    Assessment: Pharmacy asked to initiate and monitor Heparin for Afib.  Pt has been on Eliquis in the past but reportedly not taking now.  Baseline labs are normal.  Goal of Therapy:  Heparin level 0.3-0.7 units/ml Monitor platelets by anticoagulation protocol: Yes   Plan:  Heparin 4000 units bolus x 1 then infusion at 750 units/hr Check heparin level in 8 hours  [place] Crane, Lailani Tool A 10/30/2019,2:30 AM

## 2019-10-30 NOTE — Procedures (Signed)
Patient Name: Richard [name]  MRN: [id number]  Epilepsy Attending: Lora Havens  Referring Physician/Provider: Darel Hong, NP Date: 10/30/2019 Duration: 22.57 minutes  Patient history: 64 year old male with cocaine use who presented with respiratory distress and was noted to have seizure-like activity.  EEG to evaluate for seizures.  Level of alertness: Comatose  AEDs during EEG study: Propofol  Technical aspects: This EEG study was done with scalp electrodes positioned according to the 10-20 International system of electrode placement. Electrical activity was acquired at a sampling rate of 500Hz  and reviewed with a high frequency filter of 70Hz  and a low frequency filter of 1Hz . EEG data were recorded continuously and digitally stored.   Description: EEG showed continuous generalized and lateralized right hemisphere, 3 to 5 Hz theta-delta slowing.  Intermittent rhythmic generalized 2 to 3 Hz delta slowing was also noted.  Sharp transients were seen in left frontotemporal region.  Hyperventilation and photic stimulation were not performed.  Abnormality -Continuous slow, generalized and lateralized right hemisphere  IMPRESSION: This study showed evidence of nonspecific cortical dysfunction in right hemisphere as well as severe diffuse encephalopathy, nonspecific etiology but could be secondary to sedation. No seizures or definite epileptiform discharges were seen throughout the recording.

## 2019-10-30 NOTE — Progress Notes (Signed)
Pharmacy Antibiotic Note  Richard [name] is a 64 y.o. male admitted on 10/29/2019. Pharmacy has been consulted for Unasyn dosing for aspiration PNA. This is day #2 of IV Unasyn, renal function has returned to what appears to be his baseline, leukocytosis with recent fevers as high as 102.70F  Plan: Continue Unasyn 3 g IV q6h  Height: 5\' 7"  (170.2 cm) Weight: 121 lb 14.6 oz (55.3 kg) IBW/kg (Calculated) : 66.1  Temp (24hrs), Avg:102.2 F (39 C), Min:98.2 F (36.8 C), Max:102.9 F (39.4 C)  Recent Labs  Lab 10/29/19 1016 10/29/19 2110 10/30/19 0258  WBC 9.9  --  11.1*  CREATININE 0.89 1.05 0.89  LATICACIDVEN 2.3*  5.3*  --   --     Estimated Creatinine Clearance: 65.6 mL/min (by C-G formula based on SCr of 0.89 mg/dL).    No Known Allergies  Antimicrobials this admission: Vanc/cefepime 1/7 x 1 Unasyn 1/7 >>  Microbiology results: 1/7 BCx: 1/4 bottles staph sp 1/7 UCx: pending  1/7 SARS CoV-2: negative  1/7 MRSA PCR: negative  Thank you for allowing pharmacy to be a part of this patient's care.  Dallie Piles, PharmD 10/30/2019 7:36 AM

## 2019-10-30 NOTE — Progress Notes (Addendum)
Initial Nutrition Assessment  DOCUMENTATION CODES:   Severe malnutrition in context of chronic illness  INTERVENTION:   Once tube feeds initiated, recommend:  Vital 1.5 @50ml /hr + Prostat 70ml daily via tube   Propofol: 7.02 ml/hr- provides 185kcal/day   Free water flushes 47ml q4 hours to maintain tube patency   Regimen provides 2085kcal/day, 96g/day protein, 1061ml/day free water  Pt likely at high refeed risk; recommend monitor K, Mg and P labs once tube feeds initiated.   NUTRITION DIAGNOSIS:   Severe Malnutrition related to chronic illness(h/o pancreatic caner, CHF, DM, substance abuse) as evidenced by moderate to severe fat depletions, moderate to severe muscle depletions.  GOAL:   Provide needs based on ASPEN/SCCM guidelines  MONITOR:   Vent status, Labs, Weight trends, Skin, I & O's  REASON FOR ASSESSMENT:   Ventilator    ASSESSMENT:   64 year old male with PMHx of HTN, DM, CAD, neuropathy, chronic back pain, chronic leg pain, CHF, A-fib, hx cocaine and etoh abuse, hx pancreatic cancer s/p Whipple procedure who is admitted with AMS, DKA, sepsis, aspiration PNA with respiratory failure requiring intubation.   Pt sedated and ventilated. OGT in place. Pt is well known to nutrition department from multiple previous admits. Pt with poor appetite and oral intake at baseline; pt generally eats one meal per day at home. Pt does generally eat fairly well while in the hospital and usually is compliant with supplements. Pt vomiting yesterday in the ED prior to intubation. RD suspects poor appetite and oral intake pta. Pt is likely at high refeed risk. Per chart, pt appears weight stable at baseline.   Medications reviewed and include: folic acid, insulin, reglan, MVI, thiamine, unasyn, pepcid, LRS @100ml /hr, propofol  Labs reviewed: wbc 11.1(H), Hgb 10.1(L), Hct 31.1(L) cbgs- 103, 142, 187, 169, 152 X 24 hrs AIC 14.9(H)- 1/8  Patient is currently intubated on ventilator  support MV: 7.8 L/min Temp (24hrs), Avg:102.1 F (38.9 C), Min:98.2 F (36.8 C), Max:102.9 F (39.4 C)  Propofol: 7.02 ml/hr- provides 185kcal/day   MAP- >17mmHg  NUTRITION - FOCUSED PHYSICAL EXAM:    Most Recent Value  Orbital Region  Severe depletion  Upper Arm Region  Severe depletion  Thoracic and Lumbar Region  Moderate depletion  Buccal Region  Severe depletion  Temple Region  Severe depletion  Clavicle Bone Region  Severe depletion  Clavicle and Acromion Bone Region  Severe depletion  Scapular Bone Region  Moderate depletion  Dorsal Hand  Severe depletion  Patellar Region  Severe depletion  Anterior Thigh Region  Moderate depletion  Posterior Calf Region  Moderate depletion  Edema (RD Assessment)  None  Hair  Reviewed  Eyes  Reviewed  Mouth  Reviewed  Skin  Reviewed  Nails  Reviewed     Diet Order:   Diet Order            Diet NPO time specified  Diet effective now             EDUCATION NEEDS:   Not appropriate for education at this time  Skin:  Skin Assessment: Reviewed RN Assessment(L BKA)  Last BM:  1/7  Height:   Ht Readings from Last 1 Encounters:  10/29/19 5\' 7"  (1.702 m)    Weight:   Wt Readings from Last 1 Encounters:  10/30/19 55.3 kg    Ideal Body Weight:  63.3 kg(adjusted for L BKA)  BMI:  Body mass index is 19.09 kg/m.  Estimated Nutritional Needs:   Kcal:  1863kcal/day  Protein:  85-95g/day  Fluid:  >1.6L/day  Koleen Distance MS, RD, LDN Pager #- [phone] Office#- [phone] After Hours Pager: [phone]

## 2019-10-30 NOTE — Progress Notes (Signed)
PHARMACY - PHYSICIAN COMMUNICATION CRITICAL VALUE ALERT - BLOOD CULTURE IDENTIFICATION (BCID)  Results for orders placed or performed during the hospital encounter of 01/09/18  Blood Culture ID Panel (Reflexed) (Collected: 01/09/2018  3:56 AM)  Result Value Ref Range   Enterococcus species NOT DETECTED NOT DETECTED   Listeria monocytogenes NOT DETECTED NOT DETECTED   Staphylococcus species DETECTED (A) NOT DETECTED   Staphylococcus aureus (BCID) NOT DETECTED NOT DETECTED   Methicillin resistance NOT DETECTED NOT DETECTED   Streptococcus species NOT DETECTED NOT DETECTED   Streptococcus agalactiae NOT DETECTED NOT DETECTED   Streptococcus pneumoniae NOT DETECTED NOT DETECTED   Streptococcus pyogenes NOT DETECTED NOT DETECTED   Acinetobacter baumannii NOT DETECTED NOT DETECTED   Enterobacteriaceae species NOT DETECTED NOT DETECTED   Enterobacter cloacae complex NOT DETECTED NOT DETECTED   Escherichia coli NOT DETECTED NOT DETECTED   Klebsiella oxytoca NOT DETECTED NOT DETECTED   Klebsiella pneumoniae NOT DETECTED NOT DETECTED   Proteus species NOT DETECTED NOT DETECTED   Serratia marcescens NOT DETECTED NOT DETECTED   Haemophilus influenzae NOT DETECTED NOT DETECTED   Neisseria meningitidis NOT DETECTED NOT DETECTED   Pseudomonas aeruginosa NOT DETECTED NOT DETECTED   Candida albicans NOT DETECTED NOT DETECTED   Candida glabrata NOT DETECTED NOT DETECTED   Candida krusei NOT DETECTED NOT DETECTED   Candida parapsilosis NOT DETECTED NOT DETECTED   Candida tropicalis NOT DETECTED NOT DETECTED   1/4 bottles GPC (staph sp)  Name of physician (or Provider) Contacted: Dr Mortimer Fries  Current Regimen  Changes to prescribed antibiotics required: none  1/4 bottles GPC (staph sp) preliminarily considered a contaminant   Dallie Piles 10/30/2019  7:35 AM

## 2019-10-30 NOTE — Consult Note (Signed)
PHARMACY CONSULT NOTE  Pharmacy Consult for Electrolyte Monitoring and Replacement   Recent Labs: Potassium (mmol/L)  Date Value  10/30/2019 3.5   Magnesium (mg/dL)  Date Value  10/29/2019 1.9   Calcium (mg/dL)  Date Value  10/30/2019 8.4 (L)   Albumin (g/dL)  Date Value  10/29/2019 4.0   Phosphorus (mg/dL)  Date Value  03/10/2019 3.2   Sodium (mmol/L)  Date Value  10/30/2019 139    Assessment: 64 year old male with PMHx of HTN, DM, CAD, neuropathy, chronic back pain, chronic leg pain, CHF, A-fib, hx cocaine and etoh abuse, hx pancreatic cancer s/p Whipple procedure who is admitted with AMS, DKA, sepsis, aspiration PNA with respiratory failure requiring intubation. He is at a high risk of refeeding once tube feeds have begun. He has received 40 mEq IV KCl in the previous 24 hours  Goal of Therapy:  Potassium 4.0 - 5.1 mmol/L magnesium 2.0 - 2.4 mg/dL All Other Electrolytes WNL  Plan:   Replace potassium with 40 mEq KCl per tube  Magnesium is borderline: repeat level in am along with phosphorous  No other electrolyte replacement required at this time  Dallie Piles ,PharmD Clinical Pharmacist 10/30/2019 1:49 PM

## 2019-10-30 NOTE — Progress Notes (Signed)
Inpatient Diabetes Program Recommendations  AACE/ADA: New Consensus Statement on Inpatient Glycemic Control (2015)  Target Ranges:  Prepandial:   less than 140 mg/dL      Peak postprandial:   less than 180 mg/dL (1-2 hours)      Critically ill patients:  140 - 180 mg/dL   Lab Results  Component Value Date   GLUCAP 152 (H) 10/30/2019   HGBA1C 14.9 (H) 10/30/2019    Review of Glycemic Control Results for Richard [name], Richard [name] (MRN [id number]) as of 10/30/2019 12:23  Ref. Range 10/30/2019 03:57 10/30/2019 04:59 10/30/2019 05:53 10/30/2019 06:23 10/30/2019 07:49  Glucose-Capillary Latest Ref Range: 70 - 99 mg/dL 103 (H) 142 (H) 187 (H) 169 (H) 152 (H)   Diabetes history: DM (Hx. Of pancreatic cancer/s/p distal pancreatectomy)  Outpatient Diabetes medications:  Lantus 19 units daily, Novolog 10 units tid with meals Current orders for Inpatient glycemic control:  Transitioned off insulin drip overnight to Lantus 10 units daily and Novolog sensitive q 4 hours.   Inpatient Diabetes Program Recommendations:    Note plan for tube feeds.  When tube feeds started, will likely need Novolog tube feed coverage 2 units q 4 hours.   Thanks  Adah Perl, RN, BC-ADM Inpatient Diabetes Coordinator Pager [phone] (8a-5p)

## 2019-10-30 NOTE — Progress Notes (Signed)
eeg completed ° °

## 2019-10-31 ENCOUNTER — Encounter: Payer: Self-pay | Admitting: Internal Medicine

## 2019-10-31 LAB — CBC
HCT: 29 % — ABNORMAL LOW (ref 39.0–52.0)
Hemoglobin: 9.4 g/dL — ABNORMAL LOW (ref 13.0–17.0)
MCH: 28.9 pg (ref 26.0–34.0)
MCHC: 32.4 g/dL (ref 30.0–36.0)
MCV: 89.2 fL (ref 80.0–100.0)
Platelets: 211 10*3/uL (ref 150–400)
RBC: 3.25 MIL/uL — ABNORMAL LOW (ref 4.22–5.81)
RDW: 14.6 % (ref 11.5–15.5)
WBC: 11.9 10*3/uL — ABNORMAL HIGH (ref 4.0–10.5)
nRBC: 0 % (ref 0.0–0.2)

## 2019-10-31 LAB — BASIC METABOLIC PANEL
Anion gap: 13 (ref 5–15)
BUN: 16 mg/dL (ref 8–23)
CO2: 22 mmol/L (ref 22–32)
Calcium: 8.2 mg/dL — ABNORMAL LOW (ref 8.9–10.3)
Chloride: 107 mmol/L (ref 98–111)
Creatinine, Ser: 0.79 mg/dL (ref 0.61–1.24)
GFR calc Af Amer: >60 mL/min (ref >60)
GFR calc non Af Amer: >60 mL/min (ref >60)
Glucose, Bld: 108 mg/dL — ABNORMAL HIGH (ref 70–99)
Potassium: 3.3 mmol/L — ABNORMAL LOW (ref 3.5–5.1)
Sodium: 142 mmol/L (ref 135–145)

## 2019-10-31 LAB — GLUCOSE, CAPILLARY
Glucose-Capillary: 106 mg/dL — ABNORMAL HIGH (ref 70–99)
Glucose-Capillary: 111 mg/dL — ABNORMAL HIGH (ref 70–99)
Glucose-Capillary: 118 mg/dL — ABNORMAL HIGH (ref 70–99)
Glucose-Capillary: 131 mg/dL — ABNORMAL HIGH (ref 70–99)
Glucose-Capillary: 132 mg/dL — ABNORMAL HIGH (ref 70–99)
Glucose-Capillary: 147 mg/dL — ABNORMAL HIGH (ref 70–99)
Glucose-Capillary: 85 mg/dL (ref 70–99)

## 2019-10-31 LAB — MAGNESIUM: Magnesium: 1.7 mg/dL (ref 1.7–2.4)

## 2019-10-31 LAB — PROCALCITONIN: Procalcitonin: 5.89 ng/mL

## 2019-10-31 LAB — PHOSPHORUS: Phosphorus: 2 mg/dL — ABNORMAL LOW (ref 2.5–4.6)

## 2019-10-31 MED ORDER — POTASSIUM PHOSPHATES 15 MMOLE/5ML IV SOLN
30.0000 mmol | Freq: Once | INTRAVENOUS | Status: AC
  Administered 2019-10-31: 10:00:00 30 mmol via INTRAVENOUS
  Filled 2019-10-31: qty 10

## 2019-10-31 MED ORDER — DEXTROSE 50 % IV SOLN
12.5000 g | Freq: Once | INTRAVENOUS | Status: AC
  Administered 2019-10-31: 12.5 g via INTRAVENOUS

## 2019-10-31 MED ORDER — DEXTROSE 50 % IV SOLN
INTRAVENOUS | Status: AC
  Filled 2019-10-31: qty 50

## 2019-10-31 MED ORDER — VITAL HIGH PROTEIN PO LIQD
1000.0000 mL | ORAL | Status: DC
  Administered 2019-10-31: 15:00:00 1000 mL

## 2019-10-31 MED ORDER — MAGNESIUM SULFATE 2 GM/50ML IV SOLN
2.0000 g | Freq: Once | INTRAVENOUS | Status: AC
  Administered 2019-10-31: 2 g via INTRAVENOUS
  Filled 2019-10-31: qty 50

## 2019-10-31 NOTE — Progress Notes (Signed)
eLink Physician-Brief Progress Note Patient Name: Richard [name] DOB: 10/22/1956 MRN: [id number]   Date of Service  10/31/2019  HPI/Events of Note  Hypokalemia, Hypomagnesemia and Hypophosphatemia - K+ = 3.3, Mg++ = 1.7, PO4--- = 2.0 and Creatinine = 0.79.  eICU Interventions  Will replace K+, Mg++ and PO4---.     Intervention Category Major Interventions: Electrolyte abnormality - evaluation and management  Wannetta Langland Eugene 10/31/2019, 6:30 AM

## 2019-10-31 NOTE — Progress Notes (Signed)
Pharmacy Antibiotic Note  Richard [name] is a 64 y.o. male admitted on 10/29/2019. Pharmacy has been consulted for Unasyn dosing for aspiration PNA. This is day #2 of IV Unasyn, renal function has returned to what appears to be his baseline, leukocytosis with recent fevers as high as 102.52F  Plan: Continue Unasyn 3 g IV q6h  Height: 5\' 7"  (170.2 cm) Weight: 128 lb 12 oz (58.4 kg) IBW/kg (Calculated) : 66.1  Temp (24hrs), Avg:99.8 F (37.7 C), Min:99.5 F (37.5 C), Max:100 F (37.8 C)  Recent Labs  Lab 10/29/19 1016 10/29/19 2110 10/30/19 0258 10/30/19 0919 10/30/19 1126 10/31/19 0437  WBC 9.9  --  11.1*  --   --  11.9*  CREATININE 0.89 1.05 0.89 0.86 0.91 0.79  LATICACIDVEN 2.3*  5.3*  --   --   --   --   --     Estimated Creatinine Clearance: 77.1 mL/min (by C-G formula based on SCr of 0.79 mg/dL).    No Known Allergies  Antimicrobials this admission: Vanc/cefepime 1/7 x 1 Unasyn 1/7 >>  Microbiology results: 1/7 BCx: 1/4 bottles staph sp 1/7 UCx: no growth 1/7 SARS CoV-2: negative  1/7 MRSA PCR: negative  Thank you for allowing pharmacy to be a part of this patient's care.  Tawnya Crook, PharmD 10/31/2019 8:26 AM

## 2019-10-31 NOTE — Consult Note (Signed)
PHARMACY CONSULT NOTE  Pharmacy Consult for Electrolyte Monitoring and Replacement   Recent Labs: Potassium (mmol/L)  Date Value  10/31/2019 3.3 (L)   Magnesium (mg/dL)  Date Value  10/31/2019 1.7   Calcium (mg/dL)  Date Value  10/31/2019 8.2 (L)   Albumin (g/dL)  Date Value  10/29/2019 4.0   Phosphorus (mg/dL)  Date Value  10/31/2019 2.0 (L)   Sodium (mmol/L)  Date Value  10/31/2019 142    Assessment: 64 year old male with PMHx of HTN, DM, CAD, neuropathy, chronic back pain, chronic leg pain, CHF, A-fib, hx cocaine and etoh abuse, hx pancreatic cancer s/p Whipple procedure who is admitted with AMS, DKA, sepsis, aspiration PNA with respiratory failure requiring intubation. He is at a high risk of refeeding once tube feeds have begun.  Goal of Therapy:  Potassium 4.0 - 5.1 mmol/L magnesium 2.0 - 2.4 mg/dL All Other Electrolytes WNL  Plan:  Patient with orders for K phos 30 mmol IV x 1 and magnesium 2 g IV x 1. Will order electrolytes with am labs.  Tawnya Crook ,PharmD Clinical Pharmacist 10/31/2019 7:52 AM

## 2019-10-31 NOTE — H&P (Signed)
Name: Richard [name] MRN: [id number] DOB: [birth year]     CONSULTATION DATE: 10/29/2019  REFERRING MD :  Lawanda Cousins [name] is a 64 year old male with a past medical history notable for polysubstance abuse, CHF, hypertension, diabetes mellitus, A. fib who presents to [facility] ED on 10/29/2019 after being found on the ground, confused and altered in his house today.  It is unknown of his last normal well time.  Upon EMS arrival he was found to be febrile, tachycardic, confused.  No known seizure-like activity.  Upon arrival to the ED he continued to exhibit fever, confusion, along with tremors.  ED provider discussed with patient's significant other [name], who reported that the patient was doing drugs all last night.  While in the ED he was noted to be vomiting, so he was intubated for airway protection due to concern for ongoing aspiration.  Initial work-up in the ED revealed glucose 445, anion gap 18, high-sensitivity troponin 34, lactic acid 5.3, WBC 9.9, hemoglobin 11.4, CK 169.  Venous blood gas with pH 7.39 / CO2 42 / pO2 <31 / Bicarb 25.4.  His COVID-19 PCR is negative, influenza PCR is negative.  Urinalysis is negative for UTI, however positive for ketones.  Urine drug screen is positive for cocaine.  CT head is negative for any acute intracranial abnormality, chest x-ray with bilateral perihilar opacities concerning for possible pneumonia.  He met sepsis protocol therefore he received IV fluids and broad-spectrum antibiotics.   PCCM was contacted to admit the patient to ICU for further work-up and treatment of acute hypoxic respiratory failure in the setting of  aspiration pneumonitis secondary to acute cocaine toxicity with significant metabolic encephalopathy with seizure-like activity.  Patient also noted to be in DKA.  ED course: 64 yo male with acute and and severe resp failure Pt was found on the floor today by his family, unknown how long he has been laying there.  Ems cbg 535 and pt  has fruity breath. Pt was given 541m of fluid on route. Patient with severe hypoxia and severe resp failure Patient was emergently intubated and placed on VENT +VOMITNING AND SEIZURE LIKE ACTIVITY CT head no acute process COVID NEG +FEBRILE +COCAINE ABUSE  Patient is critically ill  CHIEF COMPLAINT: follow up resp failure HPI Remains critically ill On vent +aspiration pneumonia   STUDIES/SIGNIFICANT EVENTS: 1/7-CT head without contrast>>1. Stable mild atrophy with periventricular small vessel disease. No acute infarct. No mass or hemorrhage. 2.  Foci of arterial vascular calcification noted. 3.  Multiple foci of paranasal sinus disease. 1/7-CT abdomen and pelvis>>1. Patchy right lower lobe airspace disease and right middle lobe airspace disease concerning for pneumonia. 2. Gastric air-fluid level without evidence of definite gastric outlet obstruction. This appearance can be seen with dysmotility/gastroparesis. 3.  Aortic Atherosclerosis 1/9 remains on vent, SAT pending   CULTURES: SARS-CoV-2 PCR 1/7>> negative Influenza PCR 1/7>> negative Blood culture x2 1/7>> Urine 1/7>>  ANTIBIOTICS: Cefepime x1 dose in ED 1/7 Flagyl x1 dose in ED 1/7 Vancomycin x1 dose in ED 1/7 Unasyn 1/7>>    Vent Mode: PRVC FiO2 (%):  [35 %-40 %] 35 % Set Rate:  [16 bmp] 16 bmp Vt Set:  [450 mL] 450 mL PEEP:  [5 cmH20-8 cmH20] 5 cmH20 Plateau Pressure:  [10 cmH20-15 cmH20] 10 cmH20   CBC    Component Value Date/Time   WBC 11.1 (H) 10/30/2019 0258   RBC 3.50 (L) 10/30/2019 0258   HGB 10.1 (L) 10/30/2019 0258  HCT 31.1 (L) 10/30/2019 0258   PLT 225 10/30/2019 0258   MCV 88.9 10/30/2019 0258   MCH 28.9 10/30/2019 0258   MCHC 32.5 10/30/2019 0258   RDW 14.3 10/30/2019 0258   LYMPHSABS 1.6 10/29/2019 1016   MONOABS 0.5 10/29/2019 1016   EOSABS 0.0 10/29/2019 1016   BASOSABS 0.0 10/29/2019 1016     PAST MEDICAL HISTORY :   has a past medical history of Atrial fibrillation  ([place]), Cancer ([place]), CHF (congestive heart failure) ([place]), Chronic back pain, Chronic leg pain, Coronary artery disease, Diabetes mellitus without complication ([place]), Hypertension, and Neuropathy.  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 Prior to Admission medications   Medication Sig Start Date End Date Taking? Authorizing Provider  insulin aspart (NOVOLOG) 100 UNIT/ML injection Inject 0-5 Units into the skin at bedtime. CBG < 70: implement hypoglycemia protocol CBG 70 - 120: 0 units CBG 121 - 150: 0 units CBG 151 - 200: 0 units CBG 201 - 250: 2 units CBG 251 - 300: 3 units CBG 301 - 350: 4 units CBG 351 - 400: 5 units CBG > 400: call MD 11/18/18  Yes Dustin Flock, MD  insulin aspart (NOVOLOG) 100 UNIT/ML injection Inject 10 Units into the skin 3 (three) times daily with meals. 03/18/19  Yes Bettey Costa, MD  Multiple Vitamin (MULTIVITAMIN WITH MINERALS) TABS tablet Take 1 tablet by mouth daily. 01/12/18  Yes Gouru, Illene Silver, MD  ACCU-CHEK AVIVA PLUS test strip U ONCE TO BID UTD 09/23/18   Nicholes Mango, MD  ACCU-CHEK SOFTCLIX LANCETS lancets TEST 1-2 XD 09/23/18   Gouru, Illene Silver, MD  acetaminophen (TYLENOL) 325 MG tablet Take 1 tablet (325 mg total) by mouth every 6 (six) hours as needed for mild pain (or Fever >/= 101). 01/11/18   Gouru, Aruna, MD  apixaban (ELIQUIS) 5 MG TABS tablet Take 1 tablet (5 mg total) by mouth 2 (two) times daily. Patient not taking: Reported on 10/29/2019 09/23/18   Nicholes Mango, MD  Blood Glucose Monitoring Suppl (ACCU-CHEK AVIVA PLUS) w/Device KIT U TO TEST ONCE TO BID 09/23/18   Gouru, Illene Silver, MD  carvedilol (COREG) 3.125 MG tablet Take 1  tablet (3.125 mg total) by mouth 2 (two) times daily with a meal. Patient not taking: Reported on 10/29/2019 03/18/19   Bettey Costa, MD  doxycycline (VIBRAMYCIN) 100 MG capsule Take 1 capsule (100 mg total) by mouth 2 (two) times daily. Patient not taking: Reported on 08/11/2019 07/15/19   Kris Hartmann, NP  Ensure Max Protein (ENSURE MAX PROTEIN) LIQD Take 330 mLs (11 oz total) by mouth 2 (two) times daily between meals. Patient not taking: Reported on 10/29/2019 03/18/19   Bettey Costa, MD  gabapentin (NEURONTIN) 100 MG capsule Take 1 capsule (100 mg total) by mouth 3 (three) times daily. Patient taking differently: Take 100 mg by mouth daily as needed.  08/04/18   Salary, Avel Peace, MD  insulin glargine (LANTUS) 100 UNIT/ML injection Inject 0.19 mLs (19 Units total) into the skin daily. Patient not taking: Reported on 10/29/2019 03/18/19   Bettey Costa, MD  Needles & Syringes MISC Use as directed Patient not taking: Reported on 10/29/2019 09/23/18   Nicholes Mango, MD  oxyCODONE-acetaminophen (PERCOCET/ROXICET) 5-325 MG tablet Take 1-2 tablets by mouth every 4 (four) hours as needed for  severe pain. Patient not taking: Reported on 10/29/2019 07/15/19   Kris Hartmann, NP  pantoprazole (PROTONIX) 40 MG tablet Take 1 tablet (40 mg total) by mouth daily. Patient not taking: Reported on 10/29/2019 03/18/19   Bettey Costa, MD  rosuvastatin (CRESTOR) 40 MG tablet Take 1 tablet (40 mg total) by mouth daily at 6 PM. Patient not taking: Reported on 10/29/2019 03/18/19   Bettey Costa, MD  ticagrelor (BRILINTA) 90 MG TABS tablet Take 1 tablet (90 mg total) by mouth 2 (two) times daily. Patient not taking: Reported on 10/29/2019 03/18/19   Bettey Costa, MD   No Known Allergies  FAMILY HISTORY:  family history includes CAD in his mother; Diabetes Mellitus II in his father and mother. SOCIAL HISTORY:  reports that he has been smoking. He has been smoking about 0.50 packs per day. He has never used smokeless tobacco. He reports  current drug use. Drug: Cocaine. He reports that he does not drink alcohol.  REVIEW OF SYSTEMS  PATIENT IS UNABLE TO PROVIDE COMPLETE REVIEW OF SYSTEM S DUE TO SEVERE CRITICAL ILLNESS AND ENCEPHALOPATHY   VITAL SIGNS: Temp:  [98.2 F (36.8 C)-100 F (37.8 C)] 99.9 F (37.7 C) (01/09 0000) Pulse Rate:  [53-106] 89 (01/09 0000) Resp:  [8-22] 16 (01/09 0000) BP: (73-115)/(55-82) 108/75 (01/09 0000) SpO2:  [93 %-99 %] 98 % (01/09 0000) FiO2 (%):  [35 %-40 %] 35 % (01/09 0000) Weight:  [55.3 kg] 55.3 kg (01/08 0310)   I/O last 3 completed shifts: In: 5221.9 [I.V.:2330.2; IV Piggyback:2891.7] Out: 1125 [Urine:1125] Total I/O In: 591.5 [I.V.:591.5] Out: -    SpO2: 98 % FiO2 (%): 35 %   PHYSICAL EXAMINATION:  GENERAL:critically ill appearing, +resp distress HEAD: Normocephalic, atraumatic.  EYES: Pupils equal, round, reactive to light.  No scleral icterus.  MOUTH: Moist mucosal membrane. NECK: Supple. No thyromegaly. No nodules. No JVD.  PULMONARY: +rhonchi, +wheezing CARDIOVASCULAR: S1 and S2. Regular rate and rhythm. No murmurs, rubs, or gallops.  GASTROINTESTINAL: Soft, nontender, -distended. Positive bowel sounds.  MUSCULOSKELETAL: RT AKA NEUROLOGIC: obtunded SKIN:intact,warm,dry  MEDICATIONS: I have reviewed all medications and confirmed regimen as documented   CULTURE RESULTS   Recent Results (from the past 240 hour(s))  Blood Culture (routine x 2)     Status: None (Preliminary result)   Collection Time: 10/29/19 10:16 AM   Specimen: BLOOD  Result Value Ref Range Status   Specimen Description BLOOD LEFT AC  Final   Special Requests   Final    BOTTLES DRAWN AEROBIC AND ANAEROBIC Blood Culture adequate volume   Culture  Setup Time   Final    GRAM POSITIVE COCCI ANAEROBIC BOTTLE ONLY CRITICAL RESULT CALLED TO, READ BACK BY AND VERIFIED WITH: [facility] AT 9371 10/30/19 SDR Performed at [facility], [address], [place], [place] [address]     Culture GRAM POSITIVE COCCI  Final   Report Status PENDING  Incomplete  Blood Culture (routine x 2)     Status: None (Preliminary result)   Collection Time: 10/29/19 10:16 AM   Specimen: BLOOD  Result Value Ref Range Status   Specimen Description BLOOD LEFT ARM  Final   Special Requests   Final    BOTTLES DRAWN AEROBIC AND ANAEROBIC Blood Culture adequate volume   Culture   Final    NO GROWTH < 24 HOURS Performed at [facility], [address], [place], [place] [address]    Report Status PENDING  Incomplete  Respiratory Panel by  RT PCR (Flu A&B, Covid) - Nasopharyngeal Swab     Status: None   Collection Time: 10/29/19 10:16 AM   Specimen: Nasopharyngeal Swab  Result Value Ref Range Status   SARS Coronavirus 2 by RT PCR NEGATIVE NEGATIVE Final    Comment: (NOTE) SARS-CoV-2 target nucleic acids are NOT DETECTED. The SARS-CoV-2 RNA is generally detectable in upper respiratoy specimens during the acute phase of infection. The lowest concentration of SARS-CoV-2 viral copies this assay can detect is 131 copies/mL. A negative result does not preclude SARS-Cov-2 infection and should not be used as the sole basis for treatment or other patient management decisions. A negative result may occur with  improper specimen collection/handling, submission of specimen other than nasopharyngeal swab, presence of viral mutation(s) within the areas targeted by this assay, and inadequate number of viral copies (<131 copies/mL). A negative result must be combined with clinical observations, patient history, and epidemiological information. The expected result is Negative. Fact Sheet for Patients:  PinkCheek.be Fact Sheet for Healthcare Providers:  GravelBags.it This test is not yet ap proved or cleared by the [place] FDA and  has been authorized for detection and/or diagnosis of SARS-CoV-2 by FDA under an Emergency Use  Authorization (EUA). This EUA will remain  in effect (meaning this test can be used) for the duration of the COVID-19 declaration under Section 564(b)(1) of the Act, 21 U.S.C. section 360bbb-3(b)(1), unless the authorization is terminated or revoked sooner.    Influenza A by PCR NEGATIVE NEGATIVE Final   Influenza B by PCR NEGATIVE NEGATIVE Final    Comment: (NOTE) The Xpert Xpress SARS-CoV-2/FLU/RSV assay is intended as an aid in  the diagnosis of influenza from Nasopharyngeal swab specimens and  should not be used as a sole basis for treatment. Nasal washings and  aspirates are unacceptable for Xpert Xpress SARS-CoV-2/FLU/RSV  testing. Fact Sheet for Patients: PinkCheek.be Fact Sheet for Healthcare Providers: GravelBags.it This test is not yet approved or cleared by the [place] FDA and  has been authorized for detection and/or diagnosis of SARS-CoV-2 by  FDA under an Emergency Use Authorization (EUA). This EUA will remain  in effect (meaning this test can be used) for the duration of the  Covid-19 declaration under Section 564(b)(1) of the Act, 21  U.S.C. section 360bbb-3(b)(1), unless the authorization is  terminated or revoked. Performed at [facility], [address] [place], [place] [address]   Urine culture     Status: None   Collection Time: 10/29/19 11:35 AM   Specimen: In/Out Cath Urine  Result Value Ref Range Status   Specimen Description   Final    IN/OUT CATH URINE Performed at [facility], [address] [place], [place] [address]    Special Requests   Final    NONE Performed at [facility], [address], [place], [place] [address]    Culture   Final    NO GROWTH Performed at [facility], [place] [address] [place], [place] [address]    Report Status 10/30/2019 FINAL  Final  MRSA PCR Screening     Status: None   Collection Time: 10/29/19  8:20 PM    Specimen: Nasopharyngeal  Result Value Ref Range Status   MRSA by PCR NEGATIVE NEGATIVE Final    Comment:        The GeneXpert MRSA Assay (FDA approved for NASAL specimens only), is one component of a comprehensive MRSA colonization surveillance program. It is not intended to diagnose  MRSA infection nor to guide or monitor treatment for MRSA infections. Performed at [facility], [address] [place], [place] [address]           IMAGING    EEG  Result Date: 10/30/2019 Lora Havens, MD     10/30/2019  3:12 PM Patient Name: Addie Cederberg MRN: [id number] Epilepsy Attending: Lora Havens Referring Physician/Provider: Darel Hong, NP Date: 10/30/2019 Duration: 22.57 minutes Patient history: 64 year old male with cocaine use who presented with respiratory distress and was noted to have seizure-like activity.  EEG to evaluate for seizures. Level of alertness: Comatose AEDs during EEG study: Propofol Technical aspects: This EEG study was done with scalp electrodes positioned according to the 10-20 International system of electrode placement. Electrical activity was acquired at a sampling rate of _0  and reviewed with a high frequency filter of _1  and a low frequency filter of _2 . EEG data were recorded continuously and digitally stored. Description: EEG showed continuous generalized and lateralized right hemisphere, 3 to 5 Hz theta-delta slowing.  Intermittent rhythmic generalized 2 to 3 Hz delta slowing was also noted.  Sharp transients were seen in left frontotemporal region.  Hyperventilation and photic stimulation were not performed. Abnormality -Continuous slow, generalized and lateralized right hemisphere IMPRESSION: This study showed evidence of nonspecific cortical dysfunction in right hemisphere as well as severe diffuse encephalopathy, nonspecific etiology but could be secondary to sedation. No seizures or definite epileptiform discharges were seen throughout the  recording.   DG Chest Port 1 View  Result Date: 10/30/2019 CLINICAL DATA:  64 year old male with acute respiratory failure. EXAM: PORTABLE CHEST 1 VIEW COMPARISON:  Chest radiograph dated 10/29/2019. FINDINGS: Endotracheal tube with tip approximately 3.5 cm above the carina an enteric tube extending below the diaphragm with tip beyond the inferior margin of the image. Bilateral streaky and confluent densities similar or slightly worsened compared to the prior radiograph and may represent worsening edema or pneumonia. Clinical correlation is recommended. There is no pleural effusion or pneumothorax. The cardiac silhouette is within normal limits. Atherosclerotic calcification of the aorta. No acute osseous pathology. IMPRESSION: 1. Endotracheal tube with tip above the carina. 2. Bilateral streaky and confluent densities, similar or slightly worsened compared to the prior radiograph and may represent worsening edema or pneumonia. Electronically Signed   By: Anner Crete M.D.   On: 10/30/2019 03:58         Indwelling Urinary Catheter continued, requirement due to   Reason to continue Indwelling Urinary Catheter strict Intake/Output monitoring for hemodynamic instability         Ventilator continued, requirement due to severe respiratory failure   Ventilator Sedation RASS 0 to -2        ASSESSMENT AND PLAN SYNOPSIS 64 year old male with acute and severe hypoxic respiratory failure from aspiration pneumonitis/pnuemionia in the setting of acute cocaine toxicity with significant metabolic encephalopathy with seizure-like activity.  Also noted to be in DKA.   Severe ACUTE Hypoxic and Hypercapnic Respiratory Failure -continue Mechanical Ventilator support -continue Bronchodilator Therapy -Wean Fio2 and PEEP as tolerated -VAP/VENT bundle implementation -will perform SAT/SBT when respiratory parameters are met  Acute pneumonia/pneumonitis Continue IV abx Follow up  cultures   NEUROLOGY - intubated and sedated - minimal sedation to achieve a RASS goal: -1 Acute metabolic encephalopathy SAT pending +cocaine abuse and ETOH abuse EEG completed-no seizures    SEVERE ALCOHOL WITHDRAWAL -Therapy with Thiamine and MVI -CIWA Protocol    ENDO - ICU hypoglycemic\Hyperglycemia protocol -check FSBS per protocol  ACUTE  CARDIAC FAILURE-afib with RVR -oxygen as needed -Lasix as tolerated Anticoagulation with heparin   GI GI PROPHYLAXIS as indicated  NUTRITIONAL STATUS DIET-->TF's as tolerated Constipation protocol as indicated  ELECTROLYTES -follow labs as needed -replace as needed -pharmacy consultation and following    Critical Care Time devoted to patient care services described in this note is 32 minutes.   Overall, patient is critically ill, prognosis is guarded.  Patient with Multiorgan failure and at high risk for cardiac arrest and death.    Corrin Parker, M.D.  [facility]  Medical Director [place] Director [facility] Cardio-Pulmonary Department

## 2019-11-01 DIAGNOSIS — F1410 Cocaine abuse, uncomplicated: Secondary | ICD-10-CM

## 2019-11-01 DIAGNOSIS — R45851 Suicidal ideations: Secondary | ICD-10-CM

## 2019-11-01 DIAGNOSIS — F331 Major depressive disorder, recurrent, moderate: Secondary | ICD-10-CM | POA: Diagnosis present

## 2019-11-01 DIAGNOSIS — F1414 Cocaine abuse with cocaine-induced mood disorder: Secondary | ICD-10-CM

## 2019-11-01 LAB — CBC
HCT: 28.3 % — ABNORMAL LOW (ref 39.0–52.0)
Hemoglobin: 9 g/dL — ABNORMAL LOW (ref 13.0–17.0)
MCH: 28.5 pg (ref 26.0–34.0)
MCHC: 31.8 g/dL (ref 30.0–36.0)
MCV: 89.6 fL (ref 80.0–100.0)
Platelets: 216 10*3/uL (ref 150–400)
RBC: 3.16 MIL/uL — ABNORMAL LOW (ref 4.22–5.81)
RDW: 14.6 % (ref 11.5–15.5)
WBC: 11.2 10*3/uL — ABNORMAL HIGH (ref 4.0–10.5)
nRBC: 0.3 % — ABNORMAL HIGH (ref 0.0–0.2)

## 2019-11-01 LAB — BASIC METABOLIC PANEL
Anion gap: 7 (ref 5–15)
BUN: 14 mg/dL (ref 8–23)
CO2: 26 mmol/L (ref 22–32)
Calcium: 7.8 mg/dL — ABNORMAL LOW (ref 8.9–10.3)
Chloride: 107 mmol/L (ref 98–111)
Creatinine, Ser: 0.67 mg/dL (ref 0.61–1.24)
GFR calc Af Amer: >60 mL/min (ref >60)
GFR calc non Af Amer: >60 mL/min (ref >60)
Glucose, Bld: 152 mg/dL — ABNORMAL HIGH (ref 70–99)
Potassium: 3.2 mmol/L — ABNORMAL LOW (ref 3.5–5.1)
Sodium: 140 mmol/L (ref 135–145)

## 2019-11-01 LAB — GLUCOSE, CAPILLARY
Glucose-Capillary: 55 mg/dL — ABNORMAL LOW (ref 70–99)
Glucose-Capillary: 64 mg/dL — ABNORMAL LOW (ref 70–99)
Glucose-Capillary: 117 mg/dL — ABNORMAL HIGH (ref 70–99)
Glucose-Capillary: 108 mg/dL — ABNORMAL HIGH (ref 70–99)
Glucose-Capillary: 182 mg/dL — ABNORMAL HIGH (ref 70–99)
Glucose-Capillary: 136 mg/dL — ABNORMAL HIGH (ref 70–99)
Glucose-Capillary: 253 mg/dL — ABNORMAL HIGH (ref 70–99)

## 2019-11-01 LAB — PHOSPHORUS: Phosphorus: 2.7 mg/dL (ref 2.5–4.6)

## 2019-11-01 LAB — CULTURE, BLOOD (ROUTINE X 2): Special Requests: ADEQUATE

## 2019-11-01 LAB — MAGNESIUM: Magnesium: 2.1 mg/dL (ref 1.7–2.4)

## 2019-11-01 MED ORDER — LACTATED RINGERS IV SOLN: INTRAVENOUS | Status: DC

## 2019-11-01 MED ORDER — POTASSIUM CHLORIDE 20 MEQ PO PACK
40.0000 meq | PACK | Freq: Two times a day (BID) | ORAL | Status: AC
  Administered 2019-11-01: 22:00:00 40 meq via ORAL
  Filled 2019-11-01: qty 2

## 2019-11-01 MED ORDER — POTASSIUM CHLORIDE 20 MEQ PO PACK
40.0000 meq | PACK | Freq: Two times a day (BID) | ORAL | Status: DC
  Administered 2019-11-01: 09:00:00 40 meq
  Filled 2019-11-01: qty 2

## 2019-11-01 MED ORDER — DEXTROSE 5 % IV SOLN: INTRAVENOUS | Status: DC

## 2019-11-01 MED ORDER — FAMOTIDINE 20 MG PO TABS
20.0000 mg | ORAL_TABLET | Freq: Two times a day (BID) | ORAL | Status: DC
  Administered 2019-11-01 – 2019-11-04 (×6): 20 mg via ORAL
  Filled 2019-11-01 (×6): qty 1

## 2019-11-01 MED ORDER — INSULIN ASPART 100 UNIT/ML ~~LOC~~ SOLN
0.0000 [IU] | Freq: Three times a day (TID) | SUBCUTANEOUS | Status: DC
  Administered 2019-11-01: 12:00:00 2 [IU] via SUBCUTANEOUS
  Administered 2019-11-01: 5 [IU] via SUBCUTANEOUS
  Administered 2019-11-01: 1 [IU] via SUBCUTANEOUS
  Administered 2019-11-02: 18:00:00 7 [IU] via SUBCUTANEOUS
  Administered 2019-11-02: 21:00:00 3 [IU] via SUBCUTANEOUS
  Administered 2019-11-02: 2 [IU] via SUBCUTANEOUS
  Administered 2019-11-03 (×2): 3 [IU] via SUBCUTANEOUS
  Administered 2019-11-03 (×2): 5 [IU] via SUBCUTANEOUS
  Administered 2019-11-04: 3 [IU] via SUBCUTANEOUS
  Filled 2019-11-01 (×10): qty 1

## 2019-11-01 MED ORDER — DEXTROSE IN LACTATED RINGERS 5 % IV SOLN: INTRAVENOUS | Status: DC

## 2019-11-01 MED ORDER — ESCITALOPRAM OXALATE 10 MG PO TABS
5.0000 mg | ORAL_TABLET | Freq: Every day | ORAL | Status: DC
  Administered 2019-11-01 – 2019-11-04 (×4): 5 mg via ORAL
  Filled 2019-11-01 (×4): qty 0.5

## 2019-11-01 MED ORDER — DEXTROSE 50 % IV SOLN
12.5000 g | Freq: Once | INTRAVENOUS | Status: AC
  Administered 2019-11-01: 04:00:00 12.5 g via INTRAVENOUS
  Filled 2019-11-01: qty 50

## 2019-11-01 MED ORDER — DEXMEDETOMIDINE HCL IN NACL 400 MCG/100ML IV SOLN
0.4000 ug/kg/h | INTRAVENOUS | Status: DC
  Administered 2019-11-01: 04:00:00 0.6 ug/kg/h via INTRAVENOUS
  Filled 2019-11-01: qty 100

## 2019-11-01 NOTE — Progress Notes (Signed)
CRITICAL CARE NOTE 64 yo male with acute and and severe resp failure Pt was found on the floor today by his family, unknown how long he has been laying there.  Ems cbg 535 and pt has fruity breath. Pt was given 565m of fluid on route. Patient with severe hypoxia and severe resp failure Patient was emergently intubated and placed on VENT +VOMITING AND SEIZURE LIKE ACTIVITY COVID NEG +FEBRILE ASPIRATION PNEUMONIA +COCAINE ABUSE     CC  follow up respiratory failure  SUBJECTIVE Patient remains critically ill Prognosis is guarded   BP 111/62   Pulse 61   Temp 100.2 F (37.9 C) (Bladder)   Resp 17   Ht 5' 7"  (1.702 m)   Wt 61 kg   SpO2 95%   BMI 21.06 kg/m    I/O last 3 completed shifts: In: 4553.9 [I.V.:3227.5; NG/GT:466.3; IV Piggyback:860.1] Out: 800 [Urine:750; Emesis/NG output:50] No intake/output data recorded.  SpO2: 95 % FiO2 (%): 28 %   SIGNIFICANT EVENTS 1/7 intubated VENT support severe resp failure 1/8-1/9 severe encephalopathy and severe resp failure, precedex started 1/10 SAT/SBT pending   REVIEW OF SYSTEMS  PATIENT IS UNABLE TO PROVIDE COMPLETE REVIEW OF SYSTEMS DUE TO SEVERE CRITICAL ILLNESS   PHYSICAL EXAMINATION:  GENERAL:critically ill appearing, +resp distress HEAD: Normocephalic, atraumatic.  EYES: Pupils equal, round, reactive to light.  No scleral icterus.  MOUTH: Moist mucosal membrane. NECK: Supple.  PULMONARY: +rhonchi, +wheezing CARDIOVASCULAR: S1 and S2. Regular rate and rhythm. No murmurs, rubs, or gallops.  GASTROINTESTINAL: Soft, nontender, -distended.  Positive bowel sounds.   MUSCULOSKELETAL: No swelling, clubbing, or edema.  NEUROLOGIC: obtunded, GCS<8 SKIN:intact,warm,dry  MEDICATIONS: I have reviewed all medications and confirmed regimen as documented   CULTURE RESULTS   Recent Results (from the past 240 hour(s))  Blood Culture (routine x 2)     Status: Abnormal (Preliminary result)   Collection Time: 10/29/19  10:16 AM   Specimen: BLOOD  Result Value Ref Range Status   Specimen Description   Final    BLOOD LEFT AC [facility]formed at [facility] [address] [place] [address]   Special Requests   Final    BOTTLES DRAWN AEROBIC AND ANAEROBIC Blood Culture adequate volume Performed at [facility] [address], [place] [address]   Culture  Setup Time   Final    GRAM POSITIVE COCCI ANAEROBIC BOTTLE ONLY CRITICAL RESULT CALLED TO, READ BACK BY AND VERIFIED WITH: SHart RobinsonsPPremier Surgery CenterAT [id number] SDR Performed at [facility] [address], [place] [address]   Culture (A)  Final    STAPHYLOCOCCUS SPECIES (COAGULASE NEGATIVE) THE SIGNIFICANCE OF ISOLATING THIS ORGANISM FROM A SINGLE SET OF BLOOD CULTURES WHEN MULTIPLE SETS ARE DRAWN IS UNCERTAIN. PLEASE NOTIFY THE MICROBIOLOGY DEPARTMENT WITHIN ONE WEEK IF SPECIATION AND SENSITIVITIES ARE REQUIRED. Performed at [facility] [address] [place] [address]   Report Status PENDING  Incomplete  Blood Culture (routine x 2)     Status: None (Preliminary result)   Collection Time: 10/29/19 10:16 AM   Specimen: BLOOD  Result Value Ref Range Status   Specimen Description BLOOD LEFT ARM  Final   Special Requests   Final    BOTTLES DRAWN AEROBIC AND ANAEROBIC Blood Culture adequate volume   Culture   Final    NO GROWTH 3 DAYS Performed at [facility] [address] [place]  [id number]   Report  Status PENDING  Incomplete  Respiratory Panel by RT PCR (Flu A&B, Covid) - Nasopharyngeal Swab     Status: None   Collection Time: 10/29/19 10:16 AM   Specimen: Nasopharyngeal Swab  Result Value Ref Range Status   SARS Coronavirus 2 by RT PCR NEGATIVE NEGATIVE Final    Comment: (NOTE) SARS-CoV-2 target nucleic acids are NOT DETECTED. The SARS-CoV-2 RNA is generally detectable in upper respiratoy specimens during the acute phase of infection. The  lowest concentration of SARS-CoV-2 viral copies this assay can detect is 131 copies/mL. A negative result does not preclude SARS-Cov-2 infection and should not be used as the sole basis for treatment or other patient management decisions. A negative result may occur with  improper specimen collection/handling, submission of specimen other than nasopharyngeal swab, presence of viral mutation(s) within the areas targeted by this assay, and inadequate number of viral copies (<131 copies/mL). A negative result must be combined with clinical observations, patient history, and epidemiological information. The expected result is Negative. Fact Sheet for Patients:  PinkCheek.be Fact Sheet for Healthcare Providers:  GravelBags.it This test is not yet ap proved or cleared by the [place] FDA and  has been authorized for detection and/or diagnosis of SARS-CoV-2 by FDA under an Emergency Use Authorization (EUA). This EUA will remain  in effect (meaning this test can be used) for the duration of the COVID-19 declaration under Section 564(b)(1) of the Act, 21 U.S.C. section 360bbb-3(b)(1), unless the authorization is terminated or revoked sooner.    Influenza A by PCR NEGATIVE NEGATIVE Final   Influenza B by PCR NEGATIVE NEGATIVE Final    Comment: (NOTE) The Xpert Xpress SARS-CoV-2/FLU/RSV assay is intended as an aid in  the diagnosis of influenza from Nasopharyngeal swab specimens and  should not be used as a sole basis for treatment. Nasal washings and  aspirates are unacceptable for Xpert Xpress SARS-CoV-2/FLU/RSV  testing. Fact Sheet for Patients: PinkCheek.be Fact Sheet for Healthcare Providers: GravelBags.it This test is not yet approved or cleared by the [place] FDA and  has been authorized for detection and/or diagnosis of SARS-CoV-2 by  FDA under an Emergency  Use Authorization (EUA). This EUA will remain  in effect (meaning this test can be used) for the duration of the  Covid-19 declaration under Section 564(b)(1) of the Act, 21  U.S.C. section 360bbb-3(b)(1), unless the authorization is  terminated or revoked. Performed at [facility], [address] [place], [place] [address]   Urine culture     Status: None   Collection Time: 10/29/19 11:35 AM   Specimen: In/Out Cath Urine  Result Value Ref Range Status   Specimen Description   Final    IN/OUT CATH URINE Performed at [facility], [address], [place], [place] [address]    Special Requests   Final    NONE Performed at [facility], [address], [place], [place] [address]    Culture   Final    NO GROWTH Performed at [facility], [place] [address], [place],  [address]    Report Status 10/30/2019 FINAL  Final  MRSA PCR Screening     Status: None   Collection Time: 10/29/19  8:20 PM   Specimen: Nasopharyngeal  Result Value Ref Range Status   MRSA by PCR NEGATIVE NEGATIVE Final    Comment:        The GeneXpert MRSA Assay (FDA approved for NASAL specimens only), is one component of a comprehensive MRSA colonization  surveillance program. It is not intended to diagnose MRSA infection nor to guide or monitor treatment for MRSA infections. Performed at [facility], [address] [place], [place] [address]         CBC    Component Value Date/Time   WBC 11.2 (H) 11/01/2019 0431   RBC 3.16 (L) 11/01/2019 0431   HGB 9.0 (L) 11/01/2019 0431   HCT 28.3 (L) 11/01/2019 0431   PLT 216 11/01/2019 0431   MCV 89.6 11/01/2019 0431   MCH 28.5 11/01/2019 0431   MCHC 31.8 11/01/2019 0431   RDW 14.6 11/01/2019 0431   LYMPHSABS 1.6 10/29/2019 1016   MONOABS 0.5 10/29/2019 1016   EOSABS 0.0 10/29/2019 1016   BASOSABS 0.0 10/29/2019 1016   BMP Latest Ref Rng & Units 10/31/2019 10/30/2019 10/30/2019  Glucose 70 - 99 mg/dL 108(H)  109(H) 148(H)  BUN 8 - 23 mg/dL 16 20 20   Creatinine 0.61 - 1.24 mg/dL 0.79 0.91 0.86  Sodium 135 - 145 mmol/L 142 139 139  Potassium 3.5 - 5.1 mmol/L 3.3(L) 3.5 3.2(L)  Chloride 98 - 111 mmol/L 107 107 107  CO2 22 - 32 mmol/L 22 24 26   Calcium 8.9 - 10.3 mg/dL 8.2(L) 8.4(L) 8.1(L)        Indwelling Urinary Catheter continued, requirement due to   Reason to continue Indwelling Urinary Catheter strict Intake/Output monitoring for hemodynamic instability         Ventilator continued, requirement due to severe respiratory failure   Ventilator Sedation RASS 0 to -2      ASSESSMENT AND PLAN  SYNOPSIS 64 yo male with acute and and severe resp failure from acute and severe toxic metabolic encephalopathy from acute COCAINE ABUSE WITH SEVERE ASPIRATION PNEUMONIA WITH SEVERE ACUTE HYPOXIC RESP FAILURE    Severe ACUTE Hypoxic and Hypercapnic Respiratory Failure -continue Full MV support -continue Bronchodilator Therapy -Wean Fio2 and PEEP as tolerated -will perform SAT/SBT when respiratory parameters are met    NEUROLOGY - intubated and sedated - minimal sedation to achieve a RASS goal: -1 Wake up assessment pending   CARDIAC ICU monitoring  ID-ASPIRATION PNEUMONIA -continue IV abx as prescibed -follow up cultures  GI GI PROPHYLAXIS as indicated  NUTRITIONAL STATUS DIET-->TF's as tolerated Constipation protocol as indicated  ENDO - will use ICU hypoglycemic\Hyperglycemia protocol if indicated   ELECTROLYTES -follow labs as needed -replace as needed -pharmacy consultation and following   DVT/GI PRX ordered TRANSFUSIONS AS NEEDED MONITOR FSBS ASSESS the need for LABS as needed   Critical Care Time devoted to patient care services described in this note is 32 minutes.   Overall, patient is critically ill, prognosis is guarded.    Corrin Parker, M.D.  [facility]  Medical Director [facility]  Director [facility] Cardio-Pulmonary Department

## 2019-11-01 NOTE — Progress Notes (Signed)
Pharmacy Antibiotic Note  Richard [name] is a 64 y.o. male admitted on 10/29/2019. Pharmacy has been consulted for Unasyn dosing for aspiration PNA. This is day #2 of IV Unasyn, renal function has returned to what appears to be his baseline, leukocytosis with recent fevers as high as 102.52F  Plan: Continue Unasyn 3 g IV q6h  Height: 5\' 7"  (170.2 cm) Weight: 134 lb 7.7 oz (61 kg) IBW/kg (Calculated) : 66.1  Temp (24hrs), Avg:99.3 F (37.4 C), Min:98.2 F (36.8 C), Max:100.2 F (37.9 C)  Recent Labs  Lab 10/29/19 1016 10/30/19 0258 10/30/19 0919 10/30/19 1126 10/31/19 0437 11/01/19 0431  WBC 9.9 11.1*  --   --  11.9* 11.2*  CREATININE 0.89 0.89 0.86 0.91 0.79 0.67  LATICACIDVEN 2.3*  5.3*  --   --   --   --   --     Estimated Creatinine Clearance: 80.5 mL/min (by C-G formula based on SCr of 0.67 mg/dL).    No Known Allergies  Antimicrobials this admission: Vanc/cefepime 1/7 x 1 Unasyn 1/7 >>  Microbiology results: 1/7 BCx: 1/4 bottles staph sp 1/7 UCx: no growth 1/7 SARS CoV-2: negative  1/7 MRSA PCR: negative  Thank you for allowing pharmacy to be a part of this patient's care.  Tawnya Crook, PharmD 11/01/2019 7:38 AM

## 2019-11-01 NOTE — Plan of Care (Addendum)
Pt extubated to room air at 1100, PO intake adequate, monitoring UOP which remains low, MD aware, we will leave fluids running for now and change to LR at 50.  Pt A&O, does not remember any events leading up to his admission.  When asked about the cocaine that showed up in urine tox screen he stated "Well I know that's not true." When told he was found in the floor by his family he asked "why didn't they just let me die?"  States his family does not care about him.  I assured him someone has called about him every day.  He at first stated we were not to tell his Walters anything about him, but then he borrowed my phone to call her himself and asked her to come and bring his wallet.  I informed him she is his listed HCPOA and he agreed, she is.  He agrees we can give her information and he will keep the password she set up. Psych consult placed for depression

## 2019-11-01 NOTE — Consult Note (Addendum)
[facility] Face-to-Face Psychiatry Consult   Reason for Consult:  Depression Referring Physician:  Dr Mortimer Fries Patient Identification: Richard [name] MRN:  [id number] Principal Diagnosis: Acute respiratory failure Diagnosis:  Active Problems:   Cocaine abuse ([place])   Major depressive disorder, recurrent episode, moderate ([place])   Acute respiratory failure ([place])  Total Time spent with patient: 1 hour  Subjective:   Richard [name] is a 64 y.o. male patient admitted with respiratory failure r/t.  Patient seen and evaluated in person by this provider.  He reports being depressed and having suicidal ideations, no clear plan.  He is upset because his girlfriend of 20 years does not want him to return to live with her as she wants a better life, according to him.  Reports using cocaine 1-2 times a week and previous alcohol dependence.  His son lives in the [place] area and his daughter lives in town.  No homicidal ideations or hallucinations.  Unable to care for himself at this time and disturbed by his current health status and prognosis.  He is agreeable to starting antidepressant as he does feel like he needs it, no past psychiatric treatment or history besides polysubstance abuse.  Recommendation placed in treatment plan below.  HPI per MD:  Richard [name] is a 64 year old male with a past medical history notable for polysubstance abuse, CHF, hypertension, diabetes mellitus, A. fib who presents to [facility] ED on 10/29/2019 after being found on the ground, confused and altered in his house today.  It is unknown of his last normal well time.  Upon EMS arrival he was found to be febrile, tachycardic, confused.  No known seizure-like activity.  Upon arrival to the ED he continued to exhibit fever, confusion, along with tremors.  ED provider discussed with patient's significant other [name], who reported that the patient was doing drugs all last night.  While in the ED he was noted to be vomiting, so he was intubated for airway protection  due to concern for ongoing aspiration.  Initial work-up in the ED revealed glucose 445, anion gap 18, high-sensitivity troponin 34, lactic acid 5.3, WBC 9.9, hemoglobin 11.4, CK 169.  Venous blood gas with pH 7.39 / CO2 42 / pO2 <31 / Bicarb 25.4.  His COVID-19 PCR is negative, influenza PCR is negative.  Urinalysis is negative for UTI, however positive for ketones.  Urine drug screen is positive for cocaine.  CT head is negative for any acute intracranial abnormality, chest x-ray with bilateral perihilar opacities concerning for possible pneumonia.  He met sepsis protocol therefore he received IV fluids and broad-spectrum antibiotics.   PCCM was contacted to admit the patient to ICU for further work-up and treatment of acute hypoxic respiratory failure in the setting of  aspiration pneumonitis secondary to acute cocaine toxicity with significant metabolic encephalopathy with seizure-like activity.  Patient also noted to be in DKA.  Past Psychiatric History:  Polysubstance dependence  Risk to Self:  none Risk to Others:  none Prior Inpatient Therapy:  none Prior Outpatient Therapy:  none  Past Medical History:  Past Medical History:  Diagnosis Date   Atrial fibrillation ([place])    per patient   Cancer ([place])    CHF (congestive heart failure) (HCC)    Chronic back pain    Chronic leg pain    Coronary artery disease    Diabetes mellitus without complication (HCC)    Hypertension    Neuropathy     Past Surgical History:  Procedure Laterality Date   AMPUTATION  Left 03/13/2019   Procedure: AMPUTATION BELOW KNEE;  Surgeon: Algernon Huxley, MD;  Location: [facility] ORS;  Service: Vascular;  Laterality: Left;   CORONARY ANGIOPLASTY WITH STENT PLACEMENT     EMBOLECTOMY Left 03/12/2019   Procedure: EMBOLECTOMY SFA POPLITEAL;  Surgeon: Algernon Huxley, MD;  Location: [facility] ORS;  Service: Vascular;  Laterality: Left;   ENDARTERECTOMY FEMORAL Left 03/12/2019   Procedure: ENDARTERECTOMY FEMORAL;  Surgeon: Algernon Huxley, MD;  Location: [facility] ORS;  Service: Vascular;  Laterality: Left;   ESOPHAGOGASTRODUODENOSCOPY N/A 09/11/2017   Procedure: ESOPHAGOGASTRODUODENOSCOPY (EGD);  Surgeon: Virgel Manifold, MD;  Location: [facility] ENDOSCOPY;  Service: Endoscopy;  Laterality: N/A;   LOWER EXTREMITY ANGIOGRAPHY Left 11/17/2018   Procedure: Lower Extremity Angiography, with possible intervention;  Surgeon: Algernon Huxley, MD;  Location: [place] CV LAB;  Service: Cardiovascular;  Laterality: Left;   LOWER EXTREMITY ANGIOGRAPHY Left 03/04/2019   Procedure: Lower Extremity Angiography;  Surgeon: Algernon Huxley, MD;  Location: [place] CV LAB;  Service: Cardiovascular;  Laterality: Left;   LOWER EXTREMITY ANGIOGRAPHY Left 07/01/2019   Procedure: LOWER EXTREMITY ANGIOGRAPHY;  Surgeon: Algernon Huxley, MD;  Location: [place] CV LAB;  Service: Cardiovascular;  Laterality: Left;   pancreas removed     Family History:  Family History  Problem Relation Age of Onset   CAD Mother    Diabetes Mellitus II Mother    Diabetes Mellitus II Father    Family Psychiatric  History: none Social History:  Social History   Substance and Sexual Activity  Alcohol Use No   Alcohol/week: 0.0 standard drinks     Social History   Substance and Sexual Activity  Drug Use Yes   Types: Cocaine, Benzodiazepines   Comment: Has used in the past-heroin. Cocaine used over a year ago per pt.    Social History   Socioeconomic History   Marital status: Single    Spouse name: Not on file   Number of children: Not on file   Years of education: Not on file   Highest education level: Not on file  Occupational History   Not on file  Tobacco Use   Smoking status: Current Every Day Smoker    Packs/day: 0.50    Years: 50.00    Pack years: 25.00   Smokeless tobacco: Never Used  Substance and Sexual Activity   Alcohol use: No    Alcohol/week: 0.0 standard drinks   Drug use: Yes    Types: Cocaine, Benzodiazepines    Comment: Has used  in the past-heroin. Cocaine used over a year ago per pt.   Sexual activity: Not Currently  Other Topics Concern   Not on file  Social History Narrative   Not on file   Social Determinants of Health   Financial Resource Strain:    Difficulty of Paying Living Expenses: Not on file  Food Insecurity:    Worried About [place] in the Last Year: Not on file   Ran Out of Food in the Last Year: Not on file  Transportation Needs:    Lack of Transportation (Medical): Not on file   Lack of Transportation (Non-Medical): Not on file  Physical Activity:    Days of Exercise per Week: Not on file   Minutes of Exercise per Session: Not on file  Stress:    Feeling of Stress : Not on file  Social Connections:    Frequency of Communication with Friends and Family: Not on file  Frequency of Social Gatherings with Friends and Family: Not on file   Attends Religious Services: Not on file   Active Member of Clubs or Organizations: Not on file   Attends Archivist Meetings: Not on file   Marital Status: Not on file   Additional Social History:    Allergies:  No Known Allergies  Labs:  Results for orders placed or performed during the hospital encounter of 10/29/19 (from the past 48 hour(s))  Glucose, capillary     Status: Abnormal   Collection Time: 10/30/19  3:57 PM  Result Value Ref Range   Glucose-Capillary 66 (L) 70 - 99 mg/dL  Glucose, capillary     Status: None   Collection Time: 10/30/19  4:41 PM  Result Value Ref Range   Glucose-Capillary 98 70 - 99 mg/dL  Glucose, capillary     Status: None   Collection Time: 10/30/19  7:32 PM  Result Value Ref Range   Glucose-Capillary 77 70 - 99 mg/dL  Glucose, capillary     Status: Abnormal   Collection Time: 10/30/19 11:46 PM  Result Value Ref Range   Glucose-Capillary 66 (L) 70 - 99 mg/dL  Glucose, capillary     Status: None   Collection Time: 10/30/19 11:50 PM  Result Value Ref Range   Glucose-Capillary 70 70 - 99  mg/dL  Glucose, capillary     Status: Abnormal   Collection Time: 10/31/19 12:48 AM  Result Value Ref Range   Glucose-Capillary 131 (H) 70 - 99 mg/dL  Glucose, capillary     Status: Abnormal   Collection Time: 10/31/19  3:29 AM  Result Value Ref Range   Glucose-Capillary 111 (H) 70 - 99 mg/dL  Procalcitonin     Status: None   Collection Time: 10/31/19  4:37 AM  Result Value Ref Range   Procalcitonin 5.89 ng/mL    Comment:        Interpretation: PCT > 2 ng/mL: Systemic infection (sepsis) is likely, unless other causes are known. (NOTE)       Sepsis PCT Algorithm           Lower Respiratory Tract                                      Infection PCT Algorithm    ----------------------------     ----------------------------         PCT < 0.25 ng/mL                PCT < 0.10 ng/mL         Strongly encourage             Strongly discourage   discontinuation of antibiotics    initiation of antibiotics    ----------------------------     -----------------------------       PCT 0.25 - 0.50 ng/mL            PCT 0.10 - 0.25 ng/mL               OR       >80% decrease in PCT            Discourage initiation of  antibiotics      Encourage discontinuation           of antibiotics    ----------------------------     -----------------------------         PCT >= 0.50 ng/mL              PCT 0.26 - 0.50 ng/mL               AND       <80% decrease in PCT              Encourage initiation of                                             antibiotics       Encourage continuation           of antibiotics    ----------------------------     -----------------------------        PCT >= 0.50 ng/mL                  PCT > 0.50 ng/mL               AND         increase in PCT                  Strongly encourage                                      initiation of antibiotics    Strongly encourage escalation           of antibiotics                                      -----------------------------                                           PCT <= 0.25 ng/mL                                                 OR                                        > 80% decrease in PCT                                     Discontinue / Do not initiate                                             antibiotics Performed at [facility], [address], [place],  [address]   Magnesium     Status: None   Collection Time: 10/31/19  4:37  AM  Result Value Ref Range   Magnesium 1.7 1.7 - 2.4 mg/dL    Comment: Performed at [facility], [address] [place], [place] [address]  CBC     Status: Abnormal   Collection Time: 10/31/19  4:37 AM  Result Value Ref Range   WBC 11.9 (H) 4.0 - 10.5 K/uL   RBC 3.25 (L) 4.22 - 5.81 MIL/uL   Hemoglobin 9.4 (L) 13.0 - 17.0 g/dL   HCT 29.0 (L) 39.0 - 52.0 %   MCV 89.2 80.0 - 100.0 fL   MCH 28.9 26.0 - 34.0 pg   MCHC 32.4 30.0 - 36.0 g/dL   RDW 14.6 11.5 - 15.5 %   Platelets 211 150 - 400 K/uL   nRBC 0.0 0.0 - 0.2 %    Comment: Performed at [facility], [address] [place], [place] [address]  Phosphorus     Status: Abnormal   Collection Time: 10/31/19  4:37 AM  Result Value Ref Range   Phosphorus 2.0 (L) 2.5 - 4.6 mg/dL    Comment: Performed at [facility], [address] [place], [place] [address]  Basic metabolic panel     Status: Abnormal   Collection Time: 10/31/19  4:37 AM  Result Value Ref Range   Sodium 142 135 - 145 mmol/L   Potassium 3.3 (L) 3.5 - 5.1 mmol/L   Chloride 107 98 - 111 mmol/L   CO2 22 22 - 32 mmol/L   Glucose, Bld 108 (H) 70 - 99 mg/dL   BUN 16 8 - 23 mg/dL   Creatinine, Ser 0.79 0.61 - 1.24 mg/dL   Calcium 8.2 (L) 8.9 - 10.3 mg/dL   GFR calc non Af Amer >60 >60 mL/min   GFR calc Af Amer >60 >60 mL/min   Anion gap 13 5 - 15    Comment: Performed at [facility], [address] [place], [place] [address]  Glucose, capillary     Status: Abnormal    Collection Time: 10/31/19  7:29 AM  Result Value Ref Range   Glucose-Capillary 106 (H) 70 - 99 mg/dL  Glucose, capillary     Status: Abnormal   Collection Time: 10/31/19 11:27 AM  Result Value Ref Range   Glucose-Capillary 118 (H) 70 - 99 mg/dL  Glucose, capillary     Status: Abnormal   Collection Time: 10/31/19  4:05 PM  Result Value Ref Range   Glucose-Capillary 147 (H) 70 - 99 mg/dL  Glucose, capillary     Status: Abnormal   Collection Time: 10/31/19  7:51 PM  Result Value Ref Range   Glucose-Capillary 132 (H) 70 - 99 mg/dL  Glucose, capillary     Status: None   Collection Time: 10/31/19 11:43 PM  Result Value Ref Range   Glucose-Capillary 85 70 - 99 mg/dL  Glucose, capillary     Status: Abnormal   Collection Time: 11/01/19  3:52 AM  Result Value Ref Range   Glucose-Capillary 64 (L) 70 - 99 mg/dL  Glucose, capillary     Status: Abnormal   Collection Time: 11/01/19  3:56 AM  Result Value Ref Range   Glucose-Capillary 55 (L) 70 - 99 mg/dL  Basic metabolic panel     Status: Abnormal   Collection Time: 11/01/19  4:31 AM  Result Value Ref Range   Sodium 140 135 - 145 mmol/L   Potassium 3.2 (L) 3.5 - 5.1 mmol/L   Chloride 107 98 - 111 mmol/L   CO2 26 22 -  32 mmol/L   Glucose, Bld 152 (H) 70 - 99 mg/dL   BUN 14 8 - 23 mg/dL   Creatinine, Ser 0.67 0.61 - 1.24 mg/dL   Calcium 7.8 (L) 8.9 - 10.3 mg/dL   GFR calc non Af Amer >60 >60 mL/min   GFR calc Af Amer >60 >60 mL/min   Anion gap 7 5 - 15    Comment: Performed at [facility], [address] [place], [place] [address]  Magnesium     Status: None   Collection Time: 11/01/19  4:31 AM  Result Value Ref Range   Magnesium 2.1 1.7 - 2.4 mg/dL    Comment: Performed at [facility], [address] [place], [place] [address]  Phosphorus     Status: None   Collection Time: 11/01/19  4:31 AM  Result Value Ref Range   Phosphorus 2.7 2.5 - 4.6 mg/dL    Comment: Performed at [facility], [address] [place], [place] [address]  CBC     Status: Abnormal   Collection Time: 11/01/19  4:31 AM  Result Value Ref Range   WBC 11.2 (H) 4.0 - 10.5 K/uL   RBC 3.16 (L) 4.22 - 5.81 MIL/uL   Hemoglobin 9.0 (L) 13.0 - 17.0 g/dL   HCT 28.3 (L) 39.0 - 52.0 %   MCV 89.6 80.0 - 100.0 fL   MCH 28.5 26.0 - 34.0 pg   MCHC 31.8 30.0 - 36.0 g/dL   RDW 14.6 11.5 - 15.5 %   Platelets 216 150 - 400 K/uL   nRBC 0.3 (H) 0.0 - 0.2 %    Comment: Performed at [facility], [address] [place], [place] [address]  Glucose, capillary     Status: Abnormal   Collection Time: 11/01/19  4:56 AM  Result Value Ref Range   Glucose-Capillary 117 (H) 70 - 99 mg/dL  Glucose, capillary     Status: Abnormal   Collection Time: 11/01/19  7:42 AM  Result Value Ref Range   Glucose-Capillary 108 (H) 70 - 99 mg/dL   Comment 1 Notify RN   Glucose, capillary     Status: Abnormal   Collection Time: 11/01/19 11:55 AM  Result Value Ref Range   Glucose-Capillary 182 (H) 70 - 99 mg/dL    Current Facility-Administered Medications  Medication Dose Route Frequency Provider Last Rate Last Admin   0.9 %  sodium chloride infusion  250 mL Intravenous PRN Flora Lipps, MD       acetaminophen (TYLENOL) tablet 650 mg  650 mg Oral Q4H PRN Flora Lipps, MD   650 mg at 10/31/19 0939   Ampicillin-Sulbactam (UNASYN) 3 g in sodium chloride 0.9 % 100 mL IVPB  3 g Intravenous Q6H Kasa, Kurian, MD 200 mL/hr at 11/01/19 1421 3 g at 11/01/19 1421   apixaban (ELIQUIS) tablet 5 mg  5 mg Oral BID Flora Lipps, MD   5 mg at 11/01/19 2751   Chlorhexidine Gluconate Cloth 2 % PADS 6 each  6 each Topical Daily Flora Lipps, MD   6 each at 11/01/19 0927   dextrose 5 % in lactated ringers infusion   Intravenous Continuous Darel Hong D, NP 100 mL/hr at 11/01/19 1200 Rate Verify at 11/01/19 1200   famotidine (PEPCID) tablet 20 mg  20 mg Per Tube BID Dallie Piles, RPH   20 mg at 11/01/19 0928   fentaNYL (SUBLIMAZE) injection 100 mcg   100 mcg Intravenous Q1H PRN Flora Lipps, MD  100 mcg at 79/15/05 6979   folic acid (FOLVITE) tablet 1 mg  1 mg Oral Daily Darel Hong D, NP   1 mg at 11/01/19 4801   insulin aspart (novoLOG) injection 0-9 Units  0-9 Units Subcutaneous TID AC & HS Flora Lipps, MD   2 Units at 11/01/19 1229   insulin glargine (LANTUS) injection 10 Units  10 Units Subcutaneous QHS Darel Hong D, NP   10 Units at 10/31/19 2148   metoCLOPramide (REGLAN) injection 10 mg  10 mg Intravenous Once Duffy Bruce, MD       midazolam (VERSED) injection 2-4 mg  2-4 mg Intravenous Q1H PRN Flora Lipps, MD   4 mg at 10/31/19 2338   multivitamin with minerals tablet 1 tablet  1 tablet Oral Daily Darel Hong D, NP   1 tablet at 11/01/19 0928   ondansetron (ZOFRAN) injection 4 mg  4 mg Intravenous Q6H PRN Flora Lipps, MD       potassium chloride (KLOR-CON) packet 40 mEq  40 mEq Per Tube BID Flora Lipps, MD   40 mEq at 11/01/19 0928   sodium chloride flush (NS) 0.9 % injection 3 mL  3 mL Intravenous Q12H Flora Lipps, MD   3 mL at 11/01/19 6553   sodium chloride flush (NS) 0.9 % injection 3 mL  3 mL Intravenous PRN Flora Lipps, MD       thiamine tablet 100 mg  100 mg Oral Daily Darel Hong D, NP   100 mg at 11/01/19 7482   vecuronium (NORCURON) injection 10 mg  10 mg Intravenous Q1H PRN Flora Lipps, MD        Musculoskeletal: Strength & Muscle Tone: decreased Gait & Station: unable to stand Patient leans: N/A  Psychiatric Specialty Exam: Physical Exam  Nursing note and vitals reviewed. Constitutional: He is oriented to person, place, and time. He appears well-developed and well-nourished.  HENT:  Head: Normocephalic.  Respiratory: Effort normal.  Musculoskeletal:        General: Normal range of motion.     Cervical back: Normal range of motion.  Neurological: He is alert and oriented to person, place, and time.  Psychiatric: His speech is normal and behavior is normal. Judgment normal. His mood  appears anxious. Cognition and memory are normal. He exhibits a depressed mood. He expresses suicidal ideation.    Review of Systems  Psychiatric/Behavioral: Positive for dysphoric mood and suicidal ideas. The patient is nervous/anxious.   All other systems reviewed and are negative.   Blood pressure 126/76, pulse 94, temperature 98.1 F (36.7 C), temperature source Bladder, resp. rate 17, height 5' 7"  (1.702 m), weight 61 kg, SpO2 (!) 89 %.Body mass index is 21.06 kg/m.  General Appearance: Casual  Eye Contact:  Good  Speech:  Normal Rate  Volume:  Normal  Mood:  Anxious and Depressed  Affect:  Congruent  Thought Process:  Coherent and Descriptions of Associations: Intact  Orientation:  Full (Time, Place, and Person)  Thought Content:  Rumination  Suicidal Thoughts:  Yes.  without intent/plan  Homicidal Thoughts:  No  Memory:  Immediate;   Fair Recent;   Fair Remote;   Fair  Judgement:  Fair  Insight:  Fair  Psychomotor Activity:  Decreased  Concentration:  Concentration: Fair and Attention Span: Fair  Recall:  [employer] of Knowledge:  Fair  Language:  Fair  Akathisia:  No  Handed:  Right  AIMS (if indicated):     Assets:  Leisure Time Resilience Social  Support  ADL's:  Impaired  Cognition:  WNL  Sleep:      64 year old male admitted for acute respiratory failure related to aspiration, history of polysubstance abuse.  Positive for cocaine on admission and after being extubated reported depression with a desire to die.  He has chronic medical issues with a poor prognosis.  Agreeable to start antidepressants see recommendation below  Treatment Plan Summary: Daily contact with patient to assess and evaluate symptoms and progress in treatment, Medication management and Plan Major depressive disorder, single episode, moderate:   -Recommend Lexapro 5 mg daily  Disposition: Supportive therapy provided about ongoing stressors.  Waylan Boga, NP 11/01/2019 2:39 PM  Case  discussed and plan agreed upon as outlined above.

## 2019-11-01 NOTE — Consult Note (Signed)
PHARMACY CONSULT NOTE  Pharmacy Consult for Electrolyte Monitoring and Replacement   Recent Labs: Potassium (mmol/L)  Date Value  11/01/2019 3.2 (L)   Magnesium (mg/dL)  Date Value  11/01/2019 2.1   Calcium (mg/dL)  Date Value  11/01/2019 7.8 (L)   Albumin (g/dL)  Date Value  10/29/2019 4.0   Phosphorus (mg/dL)  Date Value  11/01/2019 2.7   Sodium (mmol/L)  Date Value  11/01/2019 140    Assessment: 64 year old male with PMHx of HTN, DM, CAD, neuropathy, chronic back pain, chronic leg pain, CHF, A-fib, hx cocaine and etoh abuse, hx pancreatic cancer s/p Whipple procedure who is admitted with AMS, DKA, sepsis, aspiration PNA with respiratory failure requiring intubation. He is at a high risk of refeeding once tube feeds have begun.  Goal of Therapy:  Potassium 4.0 - 5.1 mmol/L magnesium 2.0 - 2.4 mg/dL All Other Electrolytes WNL  Plan:  Will order potassium 40 mEq per tube x 2 doses. F/u electrolytes with morning labs.  Tawnya Crook ,PharmD Clinical Pharmacist 11/01/2019 7:35 AM

## 2019-11-01 NOTE — Progress Notes (Signed)
Pt. Extubated to room air,sat 92,rr 18.

## 2019-11-02 DIAGNOSIS — F331 Major depressive disorder, recurrent, moderate: Secondary | ICD-10-CM

## 2019-11-02 DIAGNOSIS — I10 Essential (primary) hypertension: Secondary | ICD-10-CM

## 2019-11-02 DIAGNOSIS — Z794 Long term (current) use of insulin: Secondary | ICD-10-CM

## 2019-11-02 DIAGNOSIS — E1142 Type 2 diabetes mellitus with diabetic polyneuropathy: Secondary | ICD-10-CM

## 2019-11-02 LAB — CBC WITH DIFFERENTIAL/PLATELET
Abs Immature Granulocytes: 0.05 10*3/uL (ref 0.00–0.07)
Basophils Absolute: 0 10*3/uL (ref 0.0–0.1)
Basophils Relative: 0 %
Eosinophils Absolute: 0.2 10*3/uL (ref 0.0–0.5)
Eosinophils Relative: 2 %
HCT: 28.6 % — ABNORMAL LOW (ref 39.0–52.0)
Hemoglobin: 9.4 g/dL — ABNORMAL LOW (ref 13.0–17.0)
Immature Granulocytes: 1 %
Lymphocytes Relative: 19 %
Lymphs Abs: 2 10*3/uL (ref 0.7–4.0)
MCH: 29.1 pg (ref 26.0–34.0)
MCHC: 32.9 g/dL (ref 30.0–36.0)
MCV: 88.5 fL (ref 80.0–100.0)
Monocytes Absolute: 1 10*3/uL (ref 0.1–1.0)
Monocytes Relative: 9 %
Neutro Abs: 7.3 10*3/uL (ref 1.7–7.7)
Neutrophils Relative %: 69 %
Platelets: 236 10*3/uL (ref 150–400)
RBC: 3.23 MIL/uL — ABNORMAL LOW (ref 4.22–5.81)
RDW: 14.4 % (ref 11.5–15.5)
WBC: 10.6 10*3/uL — ABNORMAL HIGH (ref 4.0–10.5)
nRBC: 0.2 % (ref 0.0–0.2)

## 2019-11-02 LAB — BASIC METABOLIC PANEL
Anion gap: 9 (ref 5–15)
BUN: 8 mg/dL (ref 8–23)
CO2: 23 mmol/L (ref 22–32)
Calcium: 8 mg/dL — ABNORMAL LOW (ref 8.9–10.3)
Chloride: 107 mmol/L (ref 98–111)
Creatinine, Ser: 0.66 mg/dL (ref 0.61–1.24)
GFR calc Af Amer: >60 mL/min (ref >60)
GFR calc non Af Amer: >60 mL/min (ref >60)
Glucose, Bld: 56 mg/dL — ABNORMAL LOW (ref 70–99)
Potassium: 3.3 mmol/L — ABNORMAL LOW (ref 3.5–5.1)
Sodium: 139 mmol/L (ref 135–145)

## 2019-11-02 LAB — GLUCOSE, CAPILLARY
Glucose-Capillary: 52 mg/dL — ABNORMAL LOW (ref 70–99)
Glucose-Capillary: 52 mg/dL — ABNORMAL LOW (ref 70–99)
Glucose-Capillary: 74 mg/dL (ref 70–99)
Glucose-Capillary: 191 mg/dL — ABNORMAL HIGH (ref 70–99)
Glucose-Capillary: 320 mg/dL — ABNORMAL HIGH (ref 70–99)
Glucose-Capillary: 239 mg/dL — ABNORMAL HIGH (ref 70–99)

## 2019-11-02 LAB — MAGNESIUM: Magnesium: 2 mg/dL (ref 1.7–2.4)

## 2019-11-02 LAB — PHOSPHORUS: Phosphorus: 1.9 mg/dL — ABNORMAL LOW (ref 2.5–4.6)

## 2019-11-02 MED ORDER — POTASSIUM PHOSPHATES 15 MMOLE/5ML IV SOLN
30.0000 mmol | Freq: Once | INTRAVENOUS | Status: AC
  Administered 2019-11-02: 30 mmol via INTRAVENOUS
  Filled 2019-11-02: qty 10

## 2019-11-02 MED ORDER — ENSURE ENLIVE PO LIQD
237.0000 mL | Freq: Two times a day (BID) | ORAL | Status: DC
  Administered 2019-11-03 – 2019-11-04 (×2): 237 mL via ORAL

## 2019-11-02 MED ORDER — INSULIN GLARGINE 100 UNIT/ML ~~LOC~~ SOLN
7.0000 [IU] | Freq: Every day | SUBCUTANEOUS | Status: DC
  Administered 2019-11-02 – 2019-11-03 (×2): 7 [IU] via SUBCUTANEOUS
  Filled 2019-11-02 (×3): qty 0.07

## 2019-11-02 NOTE — Progress Notes (Signed)
Nutrition Follow-up  DOCUMENTATION CODES:   Severe malnutrition in context of chronic illness  INTERVENTION:  Provide Ensure Enlive po BID, each supplement provides 350 kcal and 20 grams of protein. Patient prefers chocolate.  Provide Magic cup TID with meals, each supplement provides 290 kcal and 9 grams of protein. Patient prefers chocolate.  Continue monitoring potassium, phosphorus, and magnesium daily and replacing as needed as patient is at risk for refeeding syndrome.  NUTRITION DIAGNOSIS:   Severe Malnutrition related to chronic illness(h/o pancreatic caner, CHF, DM, substance abuse) as evidenced by moderate fat depletion, severe fat depletion, moderate muscle depletion, severe muscle depletion.  Ongoing.  GOAL:   Patient will meet greater than or equal to 90% of their needs  Progressing.  MONITOR:   PO intake, Supplement acceptance, Labs, Weight trends, Skin, I & O's  REASON FOR ASSESSMENT:   Ventilator    ASSESSMENT:   64 year old male with PMHx of HTN, DM, CAD, neuropathy, chronic back pain, chronic leg pain, CHF, A-fib, hx cocaine and etoh abuse, hx pancreatic cancer s/p Whipple procedure who is admitted with AMS, DKA, sepsis, aspiration PNA with respiratory failure requiring intubation.  Patient was extubated on 1/10. Diet was advanced to dysphagia 3 with thin liquids on 1/10. Met with patient at bedside. He reports he has decreased appetite and intake. According to chart he is eating 50% of his meals at this time. He is amenable to drinking Ensure and eating Magic Cup to help meet his calorie/protein needs. RD also placed lunch order for patient.  Medications reviewed and include: Eliquis, famotidine, folic acid 1 mg daily, Novolog 0-9 units TID, Lantus 7 units QHS, MVI daily, thiamine 100 mg daily, Unasyn, LR at 50 mL/hr, potassium phosphate 30 mmol IV once today.  Labs reviewed: CBG 52-191, Potassium 3.3, Phosphorus 1.9.  Diet Order:   Diet Order             DIET DYS 3 Room service appropriate? Yes; Fluid consistency: Thin  Diet effective now             EDUCATION NEEDS:   Not appropriate for education at this time  Skin:  Skin Assessment: Reviewed RN Assessment(L BKA)  Last BM:  11/01/2019 - large type 5  Height:   Ht Readings from Last 1 Encounters:  10/29/19 5' 7"  (1.702 m)   Weight:   Wt Readings from Last 1 Encounters:  11/02/19 64 kg   Ideal Body Weight:  63.3 kg(adjusted for L BKA)  BMI:  Body mass index is 22.1 kg/m.  Estimated Nutritional Needs:   Kcal:  1700-1900  Protein:  85-95 grams  Fluid:  1.7-1.9 L/day  Jacklynn Barnacle, MS, RD, LDN Office: [phone] Pager: [phone] After Hours/Weekend Pager: [phone]

## 2019-11-02 NOTE — Progress Notes (Addendum)
PROGRESS NOTE    Richard [name]  [id number]  DOB: 31-May-1956  DOA: 10/29/2019 PCP: Patient, No Pcp Per Outpatient Specialists:   Hospital course:  Richard [name] is a 64 year old male with a past medical history notable for polysubstance abuse, CHF, hypertension, diabetes mellitus, A. fib who presents to [facility] ED on 10/29/2019 after being found on the ground, confused and altered in his house today.  In the ED patient was noted to be febrile with a lactic acid of 5.3, elevated blood sugar with ketones, negative Covid PCR and urine screen positive for cocaine.  Head CT was negative.  Chest x-ray with perihilar opacities concerning for pneumonia.  He was treated with broad-spectrum antibiotics and IV fluids for sepsis and he was intubated for airway protection and treatment of acute hypoxic respiratory failure thought to be secondary to aspiration.  Patient was extubated on 11/01/2019 and has done well on IV antibiotics and oxygen.  Patient is presently quite depressed as it appears that his girlfriend of 20 years is breaking up with him.  He is under the care of psychiatry.  Subjective:  Patient admits to being quite depressed.  He is intermittently teary as he states he has nowhere to go.  He also cries when he talks about his girlfriend who he seems to really love.  He denies any physical problems.  Does not think he is short of breath.  Admits to being quite weak.   Objective: Vitals:   11/02/19 1300 11/02/19 1400 11/02/19 1500 11/02/19 1600  BP: (!) 143/87 (!) 141/74 129/69 (!) 154/85  Pulse: 75 100 97 96  Resp: (!) 22 20 16 17   Temp:      TempSrc:      SpO2: 96% 96% 93% 96%  Weight:      Height:        Intake/Output Summary (Last 24 hours) at 11/02/2019 1836 Last data filed at 11/02/2019 1114 Gross per 24 hour  Intake 1049.9 ml  Output 1000 ml  Net 49.9 ml   Filed Weights   10/31/19 0411 11/01/19 0500 11/02/19 0345  Weight: 58.4 kg 61 kg 64 kg    Exam:  General: Very sad  intermittently teary tired appearing gentleman sitting up in recliner in no physical or respiratory distress. Eyes: sclera anicteric, conjuctiva with mild injection bilaterally CVS: S1-S2 clear,  Respiratory: Coarse rhonchorous breath sounds throughout. GI: NABS, soft, NT, ND, no palpable masses.  LE: No edema. No cyanosis Neuro: A/O x 3, Moving all extremities equally with normal strength, CN 3-12 intact, grossly nonfocal.   Assessment & Plan:   Acute hypoxic respiratory failure secondary to aspiration pneumonia Patient appears comfortable and is doing well status post extubation. He continues on Unasyn and oxygen with good effect.  Sepsis Resolved with broad-spectrum antibiotics and IV fluid resuscitation Antibiotics have been narrowed to Unasyn as they think he is likely had aspiration  DM/DKA DKA is resolved, patient is now on glargine and SSI AC at bedtime We will follow fingersticks closely  Depression Continue Lexapro  Atrial fibrillation On Eliquis Patient does not appear to be on a rate control agent right now most likely secondary to fear of hypotension, will re-evaluate BP in the morning.  HTN Hypertensive agents were not restarted, blood pressure is okay for now. They would likely need to be restarted once blood pressure returns to his baseline.  Alcohol Use There was initially concern for alcohol withdrawal and he was placed on CIWA protocol however no evidence for hyperadrenergic  state so this was discontinued.  Will follow symptoms closely.   DVT prophylaxis: On Eliquis Code Status: Full Family Communication: Patient states he has no family to speak to, is tearful when speaking of his girlfriend Disposition Plan: TBD, patient states he has nowhere to go after discharge.   Consultants:  PCCM  Procedures:  Status post ventilation  Antimicrobials:  Unasyn  Data Reviewed: Basic Metabolic Panel: Recent Labs  Lab 10/29/19 1016 10/30/19 0919  10/30/19 1126 10/31/19 0437 11/01/19 0431 11/02/19 0609  NA 140 139 139 142 140 139  K 4.0 3.2* 3.5 3.3* 3.2* 3.3*  CL 99 107 107 107 107 107  CO2 23 26 24 22 26 23   GLUCOSE 445* 148* 109* 108* 152* 56*  BUN 11 20 20 16 14 8   CREATININE 0.89 0.86 0.91 0.79 0.67 0.66  CALCIUM 9.3 8.1* 8.4* 8.2* 7.8* 8.0*  MG 1.9  --   --  1.7 2.1 2.0  PHOS  --   --   --  2.0* 2.7 1.9*   Liver Function Tests: Recent Labs  Lab 10/29/19 1016  AST 23  ALT 15  ALKPHOS 29*  BILITOT 1.1  PROT 7.4  ALBUMIN 4.0   No results for input(s): LIPASE, AMYLASE in the last 168 hours. No results for input(s): AMMONIA in the last 168 hours. CBC: Recent Labs  Lab 10/29/19 1016 10/30/19 0258 10/31/19 0437 11/01/19 0431 11/02/19 0609  WBC 9.9 11.1* 11.9* 11.2* 10.6*  NEUTROABS 7.5  --   --   --  7.3  HGB 11.4* 10.1* 9.4* 9.0* 9.4*  HCT 34.7* 31.1* 29.0* 28.3* 28.6*  MCV 88.1 88.9 89.2 89.6 88.5  PLT 292 225 211 216 236   Cardiac Enzymes: Recent Labs  Lab 10/29/19 1156  CKTOTAL 169   BNP (last 3 results) No results for input(s): PROBNP in the last 8760 hours. CBG: Recent Labs  Lab 11/02/19 0806 11/02/19 0809 11/02/19 0838 11/02/19 1106 11/02/19 1549  GLUCAP 52* 52* 74 191* 320*    Recent Results (from the past 240 hour(s))  Blood Culture (routine x 2)     Status: Abnormal   Collection Time: 10/29/19 10:16 AM   Specimen: BLOOD  Result Value Ref Range Status   Specimen Description   Final    BLOOD LEFT AC Performed at [facility], [address], [place], [place] [address]    Special Requests   Final    BOTTLES DRAWN AEROBIC AND ANAEROBIC Blood Culture adequate volume Performed at [facility], [address], [place], [place] [address]    Culture  Setup Time   Final    GRAM POSITIVE COCCI ANAEROBIC BOTTLE ONLY CRITICAL RESULT CALLED TO, READ BACK BY AND VERIFIED WITH: Hart Robinsons [facility] AT [id number] 10/30/19 SDR Performed at [facility], [address] [place], [place] [address]    Culture (A)  Final    STAPHYLOCOCCUS SPECIES (COAGULASE NEGATIVE) THE SIGNIFICANCE OF ISOLATING THIS ORGANISM FROM A SINGLE SET OF BLOOD CULTURES WHEN MULTIPLE SETS ARE DRAWN IS UNCERTAIN. PLEASE NOTIFY THE MICROBIOLOGY DEPARTMENT WITHIN ONE WEEK IF SPECIATION AND SENSITIVITIES ARE REQUIRED. Performed at [facility], [place] [address] [place], [place] [address]    Report Status 11/01/2019 FINAL  Final  Blood Culture (routine x 2)     Status: None (Preliminary result)   Collection Time: 10/29/19 10:16 AM   Specimen: BLOOD  Result Value Ref Range Status   Specimen Description BLOOD LEFT ARM  Final  Special Requests   Final    BOTTLES DRAWN AEROBIC AND ANAEROBIC Blood Culture adequate volume   Culture   Final    NO GROWTH 4 DAYS Performed at [facility], [address] [place], [place] [address]    Report Status PENDING  Incomplete  Respiratory Panel by RT PCR (Flu A&B, Covid) - Nasopharyngeal Swab     Status: None   Collection Time: 10/29/19 10:16 AM   Specimen: Nasopharyngeal Swab  Result Value Ref Range Status   SARS Coronavirus 2 by RT PCR NEGATIVE NEGATIVE Final    Comment: (NOTE) SARS-CoV-2 target nucleic acids are NOT DETECTED. The SARS-CoV-2 RNA is generally detectable in upper respiratoy specimens during the acute phase of infection. The lowest concentration of SARS-CoV-2 viral copies this assay can detect is 131 copies/mL. A negative result does not preclude SARS-Cov-2 infection and should not be used as the sole basis for treatment or other patient management decisions. A negative result may occur with  improper specimen collection/handling, submission of specimen other than nasopharyngeal swab, presence of viral mutation(s) within the areas targeted by this assay, and inadequate number of viral copies (<131 copies/mL). A negative result must be combined with clinical observations, patient history, and epidemiological  information. The expected result is Negative. Fact Sheet for Patients:  PinkCheek.be Fact Sheet for Healthcare Providers:  GravelBags.it This test is not yet ap proved or cleared by the [place] FDA and  has been authorized for detection and/or diagnosis of SARS-CoV-2 by FDA under an Emergency Use Authorization (EUA). This EUA will remain  in effect (meaning this test can be used) for the duration of the COVID-19 declaration under Section 564(b)(1) of the Act, 21 U.S.C. section 360bbb-3(b)(1), unless the authorization is terminated or revoked sooner.    Influenza A by PCR NEGATIVE NEGATIVE Final   Influenza B by PCR NEGATIVE NEGATIVE Final    Comment: (NOTE) The Xpert Xpress SARS-CoV-2/FLU/RSV assay is intended as an aid in  the diagnosis of influenza from Nasopharyngeal swab specimens and  should not be used as a sole basis for treatment. Nasal washings and  aspirates are unacceptable for Xpert Xpress SARS-CoV-2/FLU/RSV  testing. Fact Sheet for Patients: PinkCheek.be Fact Sheet for Healthcare Providers: GravelBags.it This test is not yet approved or cleared by the [place] FDA and  has been authorized for detection and/or diagnosis of SARS-CoV-2 by  FDA under an Emergency Use Authorization (EUA). This EUA will remain  in effect (meaning this test can be used) for the duration of the  Covid-19 declaration under Section 564(b)(1) of the Act, 21  U.S.C. section 360bbb-3(b)(1), unless the authorization is  terminated or revoked. Performed at [facility], [address], [place], [place] [address]   Urine culture     Status: None   Collection Time: 10/29/19 11:35 AM   Specimen: In/Out Cath Urine  Result Value Ref Range Status   Specimen Description   Final    IN/OUT CATH URINE Performed at [facility], [address], [place],  [place] [address]    Special Requests   Final    NONE Performed at [facility], [address], [place], [place] [address]    Culture   Final    NO GROWTH Performed at [facility], [place] [address], [place], [place] [address]    Report Status 10/30/2019 FINAL  Final  MRSA PCR Screening     Status: None   Collection Time: 10/29/19  8:20 PM  Specimen: Nasopharyngeal  Result Value Ref Range Status   MRSA by PCR NEGATIVE NEGATIVE Final    Comment:        The GeneXpert MRSA Assay (FDA approved for NASAL specimens only), is one component of a comprehensive MRSA colonization surveillance program. It is not intended to diagnose MRSA infection nor to guide or monitor treatment for MRSA infections. Performed at [facility], [address], [place], [place] [address]          Studies: No results found.      Scheduled Meds: . apixaban  5 mg Oral BID  . Chlorhexidine Gluconate Cloth  6 each Topical Daily  . escitalopram  5 mg Oral Daily  . famotidine  20 mg Oral BID  . [START ON 11/03/2019] feeding supplement (ENSURE ENLIVE)  237 mL Oral BID BM  . folic acid  1 mg Oral Daily  . insulin aspart  0-9 Units Subcutaneous TID AC & HS  . insulin glargine  7 Units Subcutaneous QHS  . multivitamin with minerals  1 tablet Oral Daily  . sodium chloride flush  3 mL Intravenous Q12H  . thiamine  100 mg Oral Daily   Continuous Infusions: . sodium chloride    . ampicillin-sulbactam (UNASYN) IV 3 g (11/02/19 1531)  . lactated ringers 50 mL/hr at 11/02/19 0600  . potassium PHOSPHATE IVPB (in mmol) 30 mmol (11/02/19 1322)    Active Problems:   Cocaine abuse (HCC)   Acute respiratory failure (HCC)   Major depressive disorder, recurrent episode, moderate ([place])    Time spent: [place], MD, FACP, [facility]. [facility]  If 7PM-7AM, please contact night-coverage [email or web address] Password [facility] 11/02/2019, 6:36 PM    LOS: 4 days

## 2019-11-02 NOTE — Progress Notes (Signed)
Blood sugar 52.Rechecked, again 52. Patient awake and alert. Given 2( 6oz)containers of apple juice.

## 2019-11-02 NOTE — Progress Notes (Signed)
1835 Report called to [facility] on 1A.

## 2019-11-02 NOTE — Progress Notes (Signed)
Transferred to 143 via bed.

## 2019-11-02 NOTE — Progress Notes (Signed)
Pharmacy Electrolyte Monitoring Consult:  Pharmacy consulted to assist in monitoring and replacing electrolytes in this 64 y.o. male admitted on 10/29/2019.   Labs:  Sodium (mmol/L)  Date Value  11/02/2019 139   Potassium (mmol/L)  Date Value  11/02/2019 3.3 (L)   Magnesium (mg/dL)  Date Value  11/02/2019 2.0   Phosphorus (mg/dL)  Date Value  11/02/2019 1.9 (L)   Calcium (mg/dL)  Date Value  11/02/2019 8.0 (L)   Albumin (g/dL)  Date Value  10/29/2019 4.0    Assessment/Plan: Potassium phosphate 58mmol IV x 1.   Electrolytes with am labs.   Will replace for goal potassium ~ 4, magnesium ~ 2, and phos ~ 2.5.   Pharmacy will continue to monitor and adjust per consult.    Daisuke Bailey L 11/02/2019 5:06 PM

## 2019-11-02 NOTE — Progress Notes (Addendum)
Inpatient Diabetes Program Recommendations  AACE/ADA: New Consensus Statement on Inpatient Glycemic Control (2015)  Target Ranges:  Prepandial:   less than 140 mg/dL      Peak postprandial:   less than 180 mg/dL (1-2 hours)      Critically ill patients:  140 - 180 mg/dL   Results for AXZEL, ROCKHILL (MRN [id number]) as of 11/02/2019 07:32  Ref. Range 10/31/2019 23:43 11/01/2019 03:52 11/01/2019 03:56 11/01/2019 04:56 11/01/2019 07:42 11/01/2019 11:55 11/01/2019 17:09 11/01/2019 21:59  Glucose-Capillary Latest Ref Range: 70 - 99 mg/dL 85 64 (L) 55 (L) 117 (H) 108 (H) 182 (H)  2 units NOVOLOG  136 (H)  1 unit NOVOLOG  253 (H)  5 units NOVOLOG +  10 units LANTUS    Results for NAASIR, CARREIRA (MRN [id number]) as of 11/02/2019 08:25  Ref. Range 11/02/2019 08:09  Glucose-Capillary Latest Ref Range: 70 - 99 mg/dL 52 (L)   Results for MARL, SEAGO (MRN [id number]) as of 11/02/2019 07:32  Ref. Range 11/11/2018 12:48 02/28/2019 18:31 10/30/2019 02:58  Hemoglobin A1C Latest Ref Range: 4.8 - 5.6 % 14.8 (H) >15.5 (H) 14.9 (H)  (380 mg/dl)    Admit with: Severe ACUTE Hypoxic and Hypercapnic Respiratory Failure/ Acute aspiration pneumonitis/ Acute Metabolic Encephalopathy in setting of Cocaine abuse and sepsis/ Seizure like activity/ Concern for impending ETOH withdrawal/ DKA  History: DM, Polysubstance Abuse, Pancreatic cancer/s/p distal pancreatectomy  Home DM Meds: Lantus 19 units Daily (NOT taking)       Novolog 10 units TID with meals  Current Orders: Lantus 10 units QHS      Novolog Sensitive Correction Scale/ SSI (0-9 units) TID AC + HS     Extubated yesterday AM.  Evaluated by Psych team yesterday for depression/ suicidal ideations.   MD- Note CBG 52 mg/dl this AM after getting 10 units Lantus last PM.  Please consider reducing Lantus to 7 units QHS for tonight (30% reduction of dose)    Addendum 12:20pm--Met w/ pt at bedside today.  Pt was very quiet and would barely make eye contact  with me.  Appeared sad and tired.  Told me he lives with his daughter's mother and that he needs to find a new place to live b/c the relationship is not going well.  Stated to me he is supposed to take Lantus 30 units QHS and Novolog 20 units TID with meals.  Stated he does not always remember to take his insulin.  Has had diabetes a long time and told me he was taking insulin prior to the doctors removing his pancreas due to cancer.  Stated he knows how to give injections b/c he has been giving injections a long time.  Has access to insulin and states he has insulin at home.  When asked if anyone ever helps him with giving his medicines he stated "No".   Reviewed pt's current A1c of 14.9% with him.  Explained what an A1c is and what it measures.  Reminded patient that his goal A1c is 7% or less per ADA standards to prevent both acute and long-term complications.  Explained to patient the extreme importance of good glucose control at home.  Strongly Encouraged patient to check his CBGs at least TID AC meals at home and to record all CBGs in a logbook for his PCP to review.  Also strongly encouraged pt to be consistent with his insulin at home.  Does not look like pt has a PCP he sees on a regular basis?  Was seeing  MD from the [name] back in 2019.       --Will follow patient during hospitalization--  Wyn Quaker RN, MSN, CDE Diabetes Coordinator Inpatient Glycemic Control Team Team Pager: [phone] (8a-5p)

## 2019-11-02 NOTE — Progress Notes (Signed)
Pharmacy Antibiotic Note  Richard [name] is a 64 y.o. male admitted on 10/29/2019. Pharmacy has been consulted for Unasyn dosing for aspiration PNA. This is day 3 of IV Unasyn, renal function has returned to what appears to be his baseline, leukocytosis with recent fevers as high as 102.68F  Plan: Continue Unasyn 3 g IV q6h  Height: 5\' 7"  (170.2 cm) Weight: 141 lb 1.5 oz (64 kg) IBW/kg (Calculated) : 66.1  Temp (24hrs), Avg:96.3 F (35.7 C), Min:90 F (32.2 C), Max:99.1 F (37.3 C)  Recent Labs  Lab 10/29/19 1016 10/30/19 0258 10/30/19 0919 10/30/19 1126 10/31/19 0437 11/01/19 0431 11/02/19 0609  WBC 9.9 11.1*  --   --  11.9* 11.2* 10.6*  CREATININE 0.89 0.89 0.86 0.91 0.79 0.67 0.66  LATICACIDVEN 2.3*  5.3*  --   --   --   --   --   --     Estimated Creatinine Clearance: 84.4 mL/min (by C-G formula based on SCr of 0.66 mg/dL).    No Known Allergies  Antimicrobials this admission: Vanc/cefepime 1/7 x 1 Unasyn 1/7 >>  Microbiology results: 1/7 BCx: 2/4 coag negative staph 1/7 UCx: no growth 1/7 SARS CoV-2: negative  1/7 MRSA PCR: negative  Thank you for allowing pharmacy to be a part of this patient's care.  Graeden Bitner L, RPh 11/02/2019 5:07 PM

## 2019-11-03 DIAGNOSIS — J690 Pneumonitis due to inhalation of food and vomit: Secondary | ICD-10-CM

## 2019-11-03 LAB — GLUCOSE, CAPILLARY
Glucose-Capillary: 293 mg/dL — ABNORMAL HIGH (ref 70–99)
Glucose-Capillary: 246 mg/dL — ABNORMAL HIGH (ref 70–99)
Glucose-Capillary: 280 mg/dL — ABNORMAL HIGH (ref 70–99)
Glucose-Capillary: 228 mg/dL — ABNORMAL HIGH (ref 70–99)

## 2019-11-03 LAB — BASIC METABOLIC PANEL
Anion gap: 7 (ref 5–15)
BUN: 5 mg/dL — ABNORMAL LOW (ref 8–23)
CO2: 25 mmol/L (ref 22–32)
Calcium: 8 mg/dL — ABNORMAL LOW (ref 8.9–10.3)
Chloride: 104 mmol/L (ref 98–111)
Creatinine, Ser: 0.59 mg/dL — ABNORMAL LOW (ref 0.61–1.24)
GFR calc Af Amer: >60 mL/min (ref >60)
GFR calc non Af Amer: >60 mL/min (ref >60)
Glucose, Bld: 227 mg/dL — ABNORMAL HIGH (ref 70–99)
Potassium: 4 mmol/L (ref 3.5–5.1)
Sodium: 136 mmol/L (ref 135–145)

## 2019-11-03 LAB — CULTURE, BLOOD (ROUTINE X 2)
Culture: NO GROWTH
Special Requests: ADEQUATE

## 2019-11-03 LAB — PHOSPHORUS: Phosphorus: 3.4 mg/dL (ref 2.5–4.6)

## 2019-11-03 LAB — MAGNESIUM: Magnesium: 2 mg/dL (ref 1.7–2.4)

## 2019-11-03 MED ORDER — CARVEDILOL 3.125 MG PO TABS
3.1250 mg | ORAL_TABLET | Freq: Two times a day (BID) | ORAL | Status: DC
  Administered 2019-11-03 – 2019-11-04 (×2): 3.125 mg via ORAL
  Filled 2019-11-03 (×2): qty 1

## 2019-11-03 MED ORDER — ROSUVASTATIN CALCIUM 20 MG PO TABS
40.0000 mg | ORAL_TABLET | Freq: Every day | ORAL | Status: DC
  Administered 2019-11-03: 17:00:00 40 mg via ORAL
  Filled 2019-11-03 (×2): qty 2

## 2019-11-03 NOTE — Care Management Important Message (Signed)
Important Message  Patient Details  Name: Richard [name] MRN: [id number] Date of Birth: [birth year]   Medicare Important Message Given:  Yes     [name] 11/03/2019, 10:42 AM

## 2019-11-03 NOTE — Progress Notes (Signed)
Pharmacy Electrolyte Monitoring Consult:  Pharmacy consulted to assist in monitoring and replacing electrolytes in this 64 y.o. male admitted on 10/29/2019.   Labs:  Sodium (mmol/L)  Date Value  11/03/2019 136   Potassium (mmol/L)  Date Value  11/03/2019 4.0   Magnesium (mg/dL)  Date Value  11/03/2019 2.0   Phosphorus (mg/dL)  Date Value  11/03/2019 3.4   Calcium (mg/dL)  Date Value  11/03/2019 8.0 (L)   Albumin (g/dL)  Date Value  10/29/2019 4.0    Assessment/Plan: No replenishment warranted today  Electrolytes with am labs.   Will replace for goal potassium ~ 4, magnesium ~ 2, and phos ~ 2.5.   Pharmacy will continue to monitor and adjust per consult.   Lu Duffel, PharmD, BCPS Clinical Pharmacist 11/03/2019 7:13 AM

## 2019-11-03 NOTE — Evaluation (Signed)
Physical Therapy Evaluation Patient Details Name: Richard [name] MRN: [id number] DOB: [birth year] Today's Date: 11/03/2019   History of Present Illness  Richard [name] is a 71yoM who comes to [facility] on 1/7 after being found on the ground, confused and altered in his house today. Patient's significant other Richard [name] reported that the patient was doing drugs all last night. intubated for airway protection 2/2 emesis. admit the patient to ICU for further work-up and treatment of acute hypoxic respiratory failure in the setting of  aspiration pneumonitis secondary to acute cocaine toxicity with significant metabolic encephalopathy with seizure-like activity.  Patient also noted to be in DKA. Extubated to RA on 1/10. PMH: polysubstance abuse, CHF, hypertension, diabetes mellitus, A. fib  Clinical Impression  Pt admitted with above diagnosis. Pt currently with functional limitations due to the deficits listed below (see "PT Problem List"). Upon entry, pt in bed, awake and agreeable to participate. The pt is alert and oriented x4, pleasant, conversational, and generally a good historian. Pt is weak coming to EOB requiring min-modA, then pt tries to don sleeve and Left BKA prosthesis, but is unable to achieve pin lock; Prosthesis is deferred at this point to prioritize cleanup as pt has had a bowel movement in bed. Pt attests to typically being able to STS/SPT c RLE only and RW. Pt given modA to rise then minA trunk support for pivot to [facility] where he becomes dizzy and weak. BP established 150s SBP dizziness without resolution. RN assisted with pericare and pt SPT back to bed with modA. Pt returned to supine, HOM slightly elevated, dizziness/malaise without much improvement. Functional mobility assessment demonstrates increased effort/time requirements, poor tolerance, and need for physical assistance, whereas the patient performed these at a higher level of independence PTA. AT baseline, pt typically is modified independent  for household AMB c RW, but he has struggled to achieve AMB tolerance for limited community distances. For this a reason a WC would be pertinent for IADL performance for energy conservation. Pt will benefit from skilled PT intervention to increase independence and safety with basic mobility in preparation for discharge to the venue listed below.       Follow Up Recommendations SNF;Supervision for mobility/OOB    Equipment Recommendations  Wheelchair (measurements PT);Wheelchair cushion (measurements PT);Other (comment)(anti tippers, elevated leg rests.)    Recommendations for Other Services       Precautions / Restrictions Precautions Precautions: Fall      Mobility  Bed Mobility Overal bed mobility: Needs Assistance Bed Mobility: Supine to Sit;Sit to Supine     Supine to sit: Min assist Sit to supine: Min assist   General bed mobility comments: fairly weak, puts forth good effort  Transfers Overall transfer level: Needs assistance Equipment used: Rolling walker (2 wheeled) Transfers: Stand Pivot Transfers   Stand pivot transfers: Mod assist       General transfer comment: Requires ModA lift assist to rise fully, then able to weight bear through RW to pivot shod RLE; Pt unable to don LLE prosthesis inititally.(Pt dizzy after pivot to BSC, cleaned up and moved back to bed.)  Ambulation/Gait                Stairs            Wheelchair Mobility    Modified Rankin (Stroke Patients Only)       Balance Overall balance assessment: History of Falls;Mild deficits observed, not formally tested;Needs assistance Sitting-balance support: No upper extremity supported;Feet unsupported Sitting balance-Leahy Scale: Normal  Standing balance support: Bilateral upper extremity supported;During functional activity Standing balance-Leahy Scale: Fair                               Pertinent Vitals/Pain Pain Assessment: 0-10 Pain Score: 8  Pain  Location: LLE residual limb and popliteal fossa Pain Descriptors / Indicators: Aching Pain Intervention(s): Limited activity within patient's tolerance;Monitored during session;Premedicated before session;Repositioned    Home Living Family/patient expects to be discharged to:: Unsure(SO not willing to let pt back in the house) Living Arrangements: Alone             Home Equipment: Walker - 2 wheels Additional Comments: LLE BKA Prosthesis    Prior Function Level of Independence: Independent with assistive device(s)         Comments: Independent with ADL PTA< but AMB limited to household distances with RW. Reports ~5 falls in past 6 months d/t falling/LOB.     Hand Dominance   Dominant Hand: Left    Extremity/Trunk Assessment   Upper Extremity Assessment Upper Extremity Assessment: Generalized weakness    Lower Extremity Assessment Lower Extremity Assessment: Generalized weakness       Communication   Communication: No difficulties  Cognition Arousal/Alertness: Awake/alert Behavior During Therapy: WFL for tasks assessed/performed Overall Cognitive Status: Within Functional Limits for tasks assessed                                        General Comments      Exercises     Assessment/Plan    PT Assessment Patient needs continued PT services  PT Problem List Decreased strength;Decreased range of motion;Decreased activity tolerance;Decreased balance;Decreased mobility;Decreased coordination       PT Treatment Interventions DME instruction;Balance training;Gait training;Stair training;Functional mobility training;Therapeutic activities;Therapeutic exercise;Patient/family education    PT Goals (Current goals can be found in the Care Plan section)  Acute Rehab PT Goals Patient Stated Goal: regain strength improve AMB PT Goal Formulation: With patient Time For Goal Achievement: 11/17/19 Potential to Achieve Goals: Good    Frequency Min  2X/week   Barriers to discharge Inaccessible home environment;Decreased caregiver support Pt has no home to DC to, now kicked out of SO's home    Co-evaluation               AM-PAC PT "6 Clicks" Mobility  Outcome Measure Help needed turning from your back to your side while in a flat bed without using bedrails?: A Lot Help needed moving from lying on your back to sitting on the side of a flat bed without using bedrails?: A Lot Help needed moving to and from a bed to a chair (including a wheelchair)?: A Lot Help needed standing up from a chair using your arms (e.g., wheelchair or bedside chair)?: A Lot Help needed to walk in hospital room?: Total Help needed climbing 3-5 steps with a railing? : Total 6 Click Score: 10    End of Session   Activity Tolerance: Treatment limited secondary to medical complications (Comment);Patient limited by fatigue;Patient limited by lethargy Patient left: in bed;with call bell/phone within reach;with bed alarm set Nurse Communication: Mobility status PT Visit Diagnosis: Unsteadiness on feet (R26.81);Difficulty in walking, not elsewhere classified (R26.2);Muscle weakness (generalized) (M62.81);History of falling (Z91.81);Other abnormalities of gait and mobility (R26.89)    Time: 1520-1550 PT Time Calculation (min) (ACUTE ONLY): 30 min  Charges:   PT Evaluation $PT Eval Moderate Complexity: 1 Mod PT Treatments $Therapeutic Exercise: 8-22 mins        4:50 PM, 11/03/19 Etta Grandchild, PT, DPT Physical Therapist - [facility]  [phone] ([place])    Raiana Pharris C 11/03/2019, 4:46 PM

## 2019-11-03 NOTE — Progress Notes (Signed)
Inpatient Diabetes Program Recommendations  AACE/ADA: New Consensus Statement on Inpatient Glycemic Control (2015)  Target Ranges:  Prepandial:   less than 140 mg/dL      Peak postprandial:   less than 180 mg/dL (1-2 hours)      Critically ill patients:  140 - 180 mg/dL   Results for Richard [name], Richard [name] (MRN [id number]) as of 11/03/2019 13:03  Ref. Range 11/02/2019 08:09 11/02/2019 08:38 11/02/2019 11:06 11/02/2019 15:49 11/02/2019 21:00  Glucose-Capillary Latest Ref Range: 70 - 99 mg/dL 52 (L) 74 191 (H)  2 units NOVOLOG  320 (H)  7 units NOVOLOG  239 (H)  3 units NOVOLOG +  7 units LANTUS   Results for Richard [name], Richard [name] (MRN [id number]) as of 11/03/2019 13:03  Ref. Range 11/03/2019 07:53 11/03/2019 11:47  Glucose-Capillary Latest Ref Range: 70 - 99 mg/dL 293 (H)  5 units NOVOLOG  246 (H)  3 units NOVOLOG       Admit with: Severe ACUTE Hypoxic and Hypercapnic Respiratory Failure/ Acute aspiration pneumonitis/ Acute Metabolic Encephalopathy in setting of Cocaine abuse and sepsis/ Seizure like activity/ Concern for impending ETOH withdrawal/ DKA  History: DM, Polysubstance Abuse, Pancreatic cancer/s/p distal pancreatectomy  Home DM Meds: Lantus 19 units Daily (NOT taking)                             Novolog 10 units TID with meals  Current Orders: Lantus 7 units QHS                            Novolog Sensitive Correction Scale/ SSI (0-9 units) TID AC + HS    MD- Patient was Hypoglycemic yesterday AM--Lantus reduced to 7 units QHS last PM.  Now pt with hyperglycemia today.  Please consider:   1. Increase Lantus back to 10 units QHS  2. Start low dose Novolog Meal Coverage: Novolog 3 units TID with meals   Note patient getting Ensure Enlive PO supplements BID between meals which each contain 45 grams of carbohydrates    --Will follow patient during hospitalization--  Wyn Quaker RN, MSN, CDE Diabetes Coordinator Inpatient Glycemic Control Team Team Pager:  [phone] (8a-5p)

## 2019-11-03 NOTE — Progress Notes (Signed)
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  A walker alone will not resolve the issues with performing activities of daily living. A wheelchair will allow patient to safely perform daily activities.  The patient can self propel in the home or has a caregiver who can provide assistance.      4:52 PM, 11/03/19 Etta Grandchild, PT, DPT Physical Therapist - [facility]  [phone] [facility])

## 2019-11-03 NOTE — Progress Notes (Signed)
PROGRESS NOTE                                                                                                                                                                                                             Patient Demographics:    Richard [name], is a 64 y.o. male, DOB - June 30, 1956, [id number]  Admit date - 10/29/2019   Admitting Physician Vashti Hey, MD  Outpatient Primary MD for the patient is Patient, No Pcp Per  LOS - 5  Outpatient Specialists: None  Chief Complaint  Patient presents with  . Fall  . Tremors       Brief Narrative 64 year old male with history of polysubstance abuse, diastolic CHF, diabetes mellitus uncontrolled, right BKA, A. fib, hypertension presented to the ED after he was found on the ground, confused.  In the ED he was febrile with lactic acidosis, hyperglycemia with ketones and in acute hypoxic respiratory failure likely secondary to aspiration pneumonia.  He was intubated for airway protection and admitted to the ICU. Patient successfully extubated and transferred to hospitalist service on 1/11. Patient quite depressed after breaking up with his long-term girlfriend.  Reported suicidal thoughts.     Subjective:   Patient reports feeling better but still depressed and occasionally having suicidal thoughts.   Assessment  & Plan :   Principal problem Aspiration pneumonia ([place]) Severe sepsis [facility] Patient presented with acute respiratory failure with hypoxemia requiring intubation.  Sepsis currently resolved.  Continue empiric IV Unasyn.  (Should complete 7 days after 1/13)  Active problems Uncontrolled type 2 diabetes mellitus with DKA DKA resolved.  Now on Lantus and sliding scale insulin.  Lantus dose reduced given low blood glucose.  Severe depression with suicidal thoughts. Psych consult appreciated and placed on low-dose Lexapro.  We will discuss with  them if patient needs to be transferred to psych service once medically cleared.  Polysubstance abuse/alcohol abuse No signs of withdrawal.  Counseled on alcohol and cocaine cessation.  Essential hypertension Home medication list shows patient only on Coreg which I have resumed today.  Generalized weakness PT evaluation.  Paroxysmal A. fib Rate controlled.  On Eliquis.  Will resume Coreg.  Hypokalemia Replenished  Code Status : Full code  Family Communication  : None at bedside  Disposition Plan  : [facility] pending psych follow up vs home  or SNF pending PT eval.  Barriers For Discharge : Improving symptoms  Consults  : Critical care, psych  Procedures  : Intubation, head CT  DVT Prophylaxis  : Eliquis  Lab Results  Component Value Date   PLT 236 11/02/2019    Antibiotics  :  Anti-infectives (From admission, onward)   Start     Dose/Rate Route Frequency Ordered Stop   10/29/19 2100  Ampicillin-Sulbactam (UNASYN) 3 g in sodium chloride 0.9 % 100 mL IVPB     3 g 200 mL/hr over 30 Minutes Intravenous Every 6 hours 10/29/19 2008     10/29/19 1015  ceFEPIme (MAXIPIME) 2 g in sodium chloride 0.9 % 100 mL IVPB     2 g 200 mL/hr over 30 Minutes Intravenous  Once 10/29/19 1005 10/29/19 1318   10/29/19 1015  metroNIDAZOLE (FLAGYL) IVPB 500 mg     500 mg 100 mL/hr over 60 Minutes Intravenous  Once 10/29/19 1005 10/29/19 1318   10/29/19 1015  vancomycin (VANCOCIN) IVPB 1000 mg/200 mL premix     1,000 mg 200 mL/hr over 60 Minutes Intravenous  Once 10/29/19 1005 10/29/19 1237        Objective:   Vitals:   11/02/19 2334 11/03/19 0500 11/03/19 0749 11/03/19 1423  BP: (!) 161/89  (!) 154/78 (!) 157/87  Pulse: 91  88 92  Resp: 18  18 20   Temp: 99.8 F (37.7 C)  98.9 F (37.2 C) 98.6 F (37 C)  TempSrc: Oral  Oral Oral  SpO2: 96%  99% 97%  Weight:  76.7 kg    Height:        Wt Readings from Last 3 Encounters:  11/03/19 76.7 kg  07/01/19 63.5 kg  04/28/19 61.2 kg      Intake/Output Summary (Last 24 hours) at 11/03/2019 1457 Last data filed at 11/03/2019 1300 Gross per 24 hour  Intake 1160.79 ml  Output 2400 ml  Net -1239.21 ml     Physical Exam  Gen: not in distress HEENT: no pallor, moist mucosa, supple neck Chest: clear b/l, no added sounds CVS: N S1&S2, no murmurs, GI: soft, NT, ND, Foley + (being removed today) Musculoskeletal: warm, right BKA, S/P left foot ray amputation    Data Review:    CBC Recent Labs  Lab 10/29/19 1016 10/30/19 0258 10/31/19 0437 11/01/19 0431 11/02/19 0609  WBC 9.9 11.1* 11.9* 11.2* 10.6*  HGB 11.4* 10.1* 9.4* 9.0* 9.4*  HCT 34.7* 31.1* 29.0* 28.3* 28.6*  PLT 292 225 211 216 236  MCV 88.1 88.9 89.2 89.6 88.5  MCH 28.9 28.9 28.9 28.5 29.1  MCHC 32.9 32.5 32.4 31.8 32.9  RDW 13.6 14.3 14.6 14.6 14.4  LYMPHSABS 1.6  --   --   --  2.0  MONOABS 0.5  --   --   --  1.0  EOSABS 0.0  --   --   --  0.2  BASOSABS 0.0  --   --   --  0.0    Chemistries  Recent Labs  Lab 10/29/19 1016 10/30/19 1126 10/31/19 0437 11/01/19 0431 11/02/19 0609 11/03/19 0437  NA 140 139 142 140 139 136  K 4.0 3.5 3.3* 3.2* 3.3* 4.0  CL 99 107 107 107 107 104  CO2 23 24 22 26 23 25   GLUCOSE 445* 109* 108* 152* 56* 227*  BUN 11 20 16 14 8  5*  CREATININE 0.89 0.91 0.79 0.67 0.66 0.59*  CALCIUM 9.3 8.4* 8.2* 7.8* 8.0* 8.0*  MG 1.9  --  1.7 2.1 2.0 2.0  AST 23  --   --   --   --   --   ALT 15  --   --   --   --   --   ALKPHOS 29*  --   --   --   --   --   BILITOT 1.1  --   --   --   --   --    ------------------------------------------------------------------------------------------------------------------ No results for input(s): CHOL, HDL, LDLCALC, TRIG, CHOLHDL, LDLDIRECT in the last 72 hours.  Lab Results  Component Value Date   HGBA1C 14.9 (H) 10/30/2019   ------------------------------------------------------------------------------------------------------------------ No results for input(s): TSH, T4TOTAL,  T3FREE, THYROIDAB in the last 72 hours.  Invalid input(s): FREET3 ------------------------------------------------------------------------------------------------------------------ No results for input(s): VITAMINB12, FOLATE, FERRITIN, TIBC, IRON, RETICCTPCT in the last 72 hours.  Coagulation profile Recent Labs  Lab 10/29/19 1016  INR 1.1    No results for input(s): DDIMER in the last 72 hours.  Cardiac Enzymes No results for input(s): CKMB, TROPONINI, MYOGLOBIN in the last 168 hours.  Invalid input(s): CK ------------------------------------------------------------------------------------------------------------------    Component Value Date/Time   BNP 135.0 (H) 10/30/2019 0258    Inpatient Medications  Scheduled Meds: . apixaban  5 mg Oral BID  . Chlorhexidine Gluconate Cloth  6 each Topical Daily  . escitalopram  5 mg Oral Daily  . famotidine  20 mg Oral BID  . feeding supplement (ENSURE ENLIVE)  237 mL Oral BID BM  . folic acid  1 mg Oral Daily  . insulin aspart  0-9 Units Subcutaneous TID AC & HS  . insulin glargine  7 Units Subcutaneous QHS  . multivitamin with minerals  1 tablet Oral Daily  . sodium chloride flush  3 mL Intravenous Q12H  . thiamine  100 mg Oral Daily   Continuous Infusions: . sodium chloride    . ampicillin-sulbactam (UNASYN) IV 3 g (11/03/19 1015)  . lactated ringers 50 mL/hr at 11/02/19 2101   PRN Meds:.sodium chloride, acetaminophen, ondansetron (ZOFRAN) IV, sodium chloride flush  Micro Results Recent Results (from the past 240 hour(s))  Blood Culture (routine x 2)     Status: Abnormal   Collection Time: 10/29/19 10:16 AM   Specimen: BLOOD  Result Value Ref Range Status   Specimen Description   Final    BLOOD LEFT AC Performed at [facility], [address], [place], [place] [address]    Special Requests   Final    BOTTLES DRAWN AEROBIC AND ANAEROBIC Blood Culture adequate volume Performed at [facility], [address] [place], [place] [address]    Culture  Setup Time   Final    GRAM POSITIVE COCCI ANAEROBIC BOTTLE ONLY CRITICAL RESULT CALLED TO, READ BACK BY AND VERIFIED WITH: [facility] AT [id number] 10/30/19 SDR Performed at [facility], [address] [facility] [employer], [place] [address]    Culture (A)  Final    STAPHYLOCOCCUS SPECIES (COAGULASE NEGATIVE) THE SIGNIFICANCE OF ISOLATING THIS ORGANISM FROM A SINGLE SET OF BLOOD CULTURES WHEN MULTIPLE SETS ARE DRAWN IS UNCERTAIN. PLEASE NOTIFY THE MICROBIOLOGY DEPARTMENT WITHIN ONE WEEK IF SPECIATION AND SENSITIVITIES ARE REQUIRED. Performed at [facility], [address] [place], [place] [address]    Report Status 11/01/2019 FINAL  Final  Blood Culture (routine x 2)     Status: None   Collection Time: 10/29/19 10:16 AM   Specimen: BLOOD  Result Value Ref Range Status   Specimen Description  BLOOD LEFT ARM  Final   Special Requests   Final    BOTTLES DRAWN AEROBIC AND ANAEROBIC Blood Culture adequate volume   Culture   Final    NO GROWTH 5 DAYS Performed at [facility], [address] [place], [place] [address]    Report Status 11/03/2019 FINAL  Final  Respiratory Panel by RT PCR (Flu A&B, Covid) - Nasopharyngeal Swab     Status: None   Collection Time: 10/29/19 10:16 AM   Specimen: Nasopharyngeal Swab  Result Value Ref Range Status   SARS Coronavirus 2 by RT PCR NEGATIVE NEGATIVE Final    Comment: (NOTE) SARS-CoV-2 target nucleic acids are NOT DETECTED. The SARS-CoV-2 RNA is generally detectable in upper respiratoy specimens during the acute phase of infection. The lowest concentration of SARS-CoV-2 viral copies this assay can detect is 131 copies/mL. A negative result does not preclude SARS-Cov-2 infection and should not be used as the sole basis for treatment or other patient management decisions. A negative result may occur with  improper specimen collection/handling, submission of specimen other than  nasopharyngeal swab, presence of viral mutation(s) within the areas targeted by this assay, and inadequate number of viral copies (<131 copies/mL). A negative result must be combined with clinical observations, patient history, and epidemiological information. The expected result is Negative. Fact Sheet for Patients:  PinkCheek.be Fact Sheet for Healthcare Providers:  GravelBags.it This test is not yet ap proved or cleared by the [place] FDA and  has been authorized for detection and/or diagnosis of SARS-CoV-2 by FDA under an Emergency Use Authorization (EUA). This EUA will remain  in effect (meaning this test can be used) for the duration of the COVID-19 declaration under Section 564(b)(1) of the Act, 21 U.S.C. section 360bbb-3(b)(1), unless the authorization is terminated or revoked sooner.    Influenza A by PCR NEGATIVE NEGATIVE Final   Influenza B by PCR NEGATIVE NEGATIVE Final    Comment: (NOTE) The Xpert Xpress SARS-CoV-2/FLU/RSV assay is intended as an aid in  the diagnosis of influenza from Nasopharyngeal swab specimens and  should not be used as a sole basis for treatment. Nasal washings and  aspirates are unacceptable for Xpert Xpress SARS-CoV-2/FLU/RSV  testing. Fact Sheet for Patients: PinkCheek.be Fact Sheet for Healthcare Providers: GravelBags.it This test is not yet approved or cleared by the [place] FDA and  has been authorized for detection and/or diagnosis of SARS-CoV-2 by  FDA under an Emergency Use Authorization (EUA). This EUA will remain  in effect (meaning this test can be used) for the duration of the  Covid-19 declaration under Section 564(b)(1) of the Act, 21  U.S.C. section 360bbb-3(b)(1), unless the authorization is  terminated or revoked. Performed at [facility], [address], [place], [place] [address]   Urine  culture     Status: None   Collection Time: 10/29/19 11:35 AM   Specimen: In/Out Cath Urine  Result Value Ref Range Status   Specimen Description   Final    IN/OUT CATH URINE Performed at [facility], [address] [place], [place] [address]    Special Requests   Final    NONE Performed at [facility], [address] [place], [place] [address]    Culture   Final    NO GROWTH Performed at [facility] [place] [facility], [place] [address] [place], [place] [address]    Report Status 10/30/2019 FINAL  Final  MRSA PCR Screening     Status: None  Collection Time: 10/29/19  8:20 PM   Specimen: Nasopharyngeal  Result Value Ref Range Status   MRSA by PCR NEGATIVE NEGATIVE Final    Comment:        The GeneXpert MRSA Assay (FDA approved for NASAL specimens only), is one component of a comprehensive MRSA colonization surveillance program. It is not intended to diagnose MRSA infection nor to guide or monitor treatment for MRSA infections. Performed at [facility], [address] [place],  [address]     Radiology Reports EEG  Result Date: 10/30/2019 Lora Havens, MD     10/30/2019  3:12 PM Patient Name: Jaran Mench MRN: [id number] Epilepsy Attending: Lora Havens Referring Physician/Provider: Darel Hong, NP Date: 10/30/2019 Duration: 22.57 minutes Patient history: 64 year old male with cocaine use who presented with respiratory distress and was noted to have seizure-like activity.  EEG to evaluate for seizures. Level of alertness: Comatose AEDs during EEG study: Propofol Technical aspects: This EEG study was done with scalp electrodes positioned according to the 10-20 International system of electrode placement. Electrical activity was acquired at a sampling rate of 500Hz  and reviewed with a high frequency filter of 70Hz  and a low frequency filter of 1Hz . EEG data were recorded continuously and digitally stored. Description: EEG showed continuous  generalized and lateralized right hemisphere, 3 to 5 Hz theta-delta slowing.  Intermittent rhythmic generalized 2 to 3 Hz delta slowing was also noted.  Sharp transients were seen in left frontotemporal region.  Hyperventilation and photic stimulation were not performed. Abnormality -Continuous slow, generalized and lateralized right hemisphere IMPRESSION: This study showed evidence of nonspecific cortical dysfunction in right hemisphere as well as severe diffuse encephalopathy, nonspecific etiology but could be secondary to sedation. No seizures or definite epileptiform discharges were seen throughout the recording.   CT Head Wo Contrast  Result Date: 10/29/2019 CLINICAL DATA:  Unwitnessed fall with altered mental status EXAM: CT HEAD WITHOUT CONTRAST TECHNIQUE: Contiguous axial images were obtained from the base of the skull through the vertex without intravenous contrast. COMPARISON:  Feb 28, 2019 FINDINGS: Brain: Slight diffuse atrophy is stable. Prominence of the cisterna magna is an apparent anatomic variant. There is no intracranial mass, hemorrhage, extra-axial fluid collection, or midline shift. There is small vessel disease in the centra semiovale bilaterally, stable. No evident acute infarct. A small focus of basal ganglia calcification on the right is likely physiologic. Vascular: There is no hyperdense vessel. There are foci of calcification in each carotid siphon region. Skull: The bony calvarium appears intact. Sinuses/Orbits: There is opacification in multiple ethmoid air cells. There is opacification in the anterior, lateral left sphenoid sinus. Other visualized paranasal sinuses are clear. Visualized orbits appear symmetric bilaterally. Other: Visualized mastoid air cells are clear. IMPRESSION: 1. Stable mild atrophy with periventricular small vessel disease. No acute infarct. No mass or hemorrhage. 2.  Foci of arterial vascular calcification noted. 3.  Multiple foci of paranasal sinus disease.  Electronically Signed   By: Lowella Grip III M.D.   On: 10/29/2019 10:45   CT ABDOMEN PELVIS W CONTRAST  Result Date: 10/29/2019 CLINICAL DATA:  Acute abdominal pain, neutropenia EXAM: CT ABDOMEN AND PELVIS WITH CONTRAST TECHNIQUE: Multidetector CT imaging of the abdomen and pelvis was performed using the standard protocol following bolus administration of intravenous contrast. CONTRAST:  130mL OMNIPAQUE IOHEXOL 300 MG/ML  SOLN COMPARISON:  09/05/2017 FINDINGS: Lower chest: Patchy right lower lobe airspace disease and right middle lobe airspace disease concerning for pneumonia. Hepatobiliary: No focal liver abnormality  is seen. No gallstones, gallbladder wall thickening, or biliary dilatation. Pancreas: Unremarkable. No pancreatic ductal dilatation or surrounding inflammatory changes. Spleen: Prior splenectomy. Adrenals/Urinary Tract: Adrenal glands are unremarkable. Kidneys are normal, without renal calculi, focal lesion, or hydronephrosis. Bladder is unremarkable. Stomach/Bowel: Gastric distension with an air-fluid level. Appendix appears normal. No evidence of bowel wall thickening, distention, or inflammatory changes. Vascular/Lymphatic: Normal caliber abdominal aorta with mild atherosclerosis. No lymphadenopathy. Reproductive: Prostate is unremarkable. Other: No abdominal wall hernia or abnormality. No abdominopelvic ascites. Musculoskeletal: No acute osseous abnormality. No aggressive osseous lesion. IMPRESSION: 1. Patchy right lower lobe airspace disease and right middle lobe airspace disease concerning for pneumonia. 2. Gastric air-fluid level without evidence of definite gastric outlet obstruction. This appearance can be seen with dysmotility/gastroparesis. 3.  Aortic Atherosclerosis (ICD10-I70.0). Electronically Signed   By: Kathreen Devoid   On: 10/29/2019 13:28   DG Chest Port 1 View  Result Date: 10/30/2019 CLINICAL DATA:  64 year old male with acute respiratory failure. EXAM: PORTABLE CHEST 1  VIEW COMPARISON:  Chest radiograph dated 10/29/2019. FINDINGS: Endotracheal tube with tip approximately 3.5 cm above the carina an enteric tube extending below the diaphragm with tip beyond the inferior margin of the image. Bilateral streaky and confluent densities similar or slightly worsened compared to the prior radiograph and may represent worsening edema or pneumonia. Clinical correlation is recommended. There is no pleural effusion or pneumothorax. The cardiac silhouette is within normal limits. Atherosclerotic calcification of the aorta. No acute osseous pathology. IMPRESSION: 1. Endotracheal tube with tip above the carina. 2. Bilateral streaky and confluent densities, similar or slightly worsened compared to the prior radiograph and may represent worsening edema or pneumonia. Electronically Signed   By: Anner Crete M.D.   On: 10/30/2019 03:58   DG Chest Portable 1 View  Result Date: 10/29/2019 CLINICAL DATA:  Intubated EXAM: PORTABLE CHEST 1 VIEW COMPARISON:  Chest radiograph from earlier today. FINDINGS: Endotracheal tube tip is 3.2 cm above the carina. Enteric tube terminates in the proximal stomach. Stable cardiomediastinal silhouette with normal heart size. No pneumothorax. No pleural effusion. Mild hazy bilateral parahilar lung opacities appear slightly worsened. Pacer pad overlies the heart and left chest. IMPRESSION: 1. Well-positioned endotracheal and enteric tubes. 2. Slightly increased mild hazy bilateral parahilar lung opacities, cannot exclude mild pulmonary edema. Electronically Signed   By: Ilona Sorrel M.D.   On: 10/29/2019 15:10   DG Chest Port 1 View  Result Date: 10/29/2019 CLINICAL DATA:  Fever. EXAM: PORTABLE CHEST 1 VIEW COMPARISON:  03/09/2019.  11/11/2018. FINDINGS: Interim removal of previously identified right IJ line. Mediastinum and hilar structures normal. Mild bilateral interstitial prominence. Pneumonitis cannot be excluded. No pleural effusion or pneumothorax.  IMPRESSION: Mild bilateral interstitial prominence. Pneumonitis cannot be excluded. Electronically Signed   By: Marcello Moores  Register   On: 10/29/2019 10:35   DG Abd Portable 1 View  Result Date: 10/29/2019 CLINICAL DATA:  Enteric tube placement EXAM: PORTABLE ABDOMEN - 1 VIEW COMPARISON:  08/25/2017 abdominal radiograph FINDINGS: Enteric tube terminates in the proximal stomach. No dilated small bowel loops. No evidence of pneumatosis or pneumoperitoneum. Excreted contrast noted in the renal collecting systems bilaterally. IMPRESSION: Enteric tube terminates in the proximal stomach. Electronically Signed   By: Ilona Sorrel M.D.   On: 10/29/2019 15:11    Time Spent in minutes  25   Nesanel Aguila M.D on 11/03/2019 at 2:57 PM  Between 7am to 7pm - Pager - [phone]  After 7pm go to [email or web address] - password [email or web address]  [facility] -  Office  [phone]

## 2019-11-03 NOTE — Consult Note (Signed)
Patient was seen again today continues to express depression with suicidal ideation.  At this point is not safe patient to go home we will plan to admit to inpatient psychiatric hospital.  TTS to find bed placement.

## 2019-11-04 ENCOUNTER — Other Ambulatory Visit: Payer: Self-pay

## 2019-11-04 ENCOUNTER — Inpatient Hospital Stay
Admission: RE | Admit: 2019-11-04 | Discharge: 2019-11-13 | DRG: 881 | Disposition: A | Discharge status: Home / Self Care | Payer: Medicare Other | Source: Intra-hospital | Attending: Psychiatry | Admitting: Psychiatry

## 2019-11-04 ENCOUNTER — Encounter: Payer: Self-pay | Admitting: Psychiatry

## 2019-11-04 DIAGNOSIS — Z6823 Body mass index (BMI) 23.0-23.9, adult: Secondary | ICD-10-CM | POA: Diagnosis not present

## 2019-11-04 DIAGNOSIS — Z8249 Family history of ischemic heart disease and other diseases of the circulatory system: Secondary | ICD-10-CM | POA: Diagnosis not present

## 2019-11-04 DIAGNOSIS — R45850 Homicidal ideations: Secondary | ICD-10-CM | POA: Diagnosis present

## 2019-11-04 DIAGNOSIS — F329 Major depressive disorder, single episode, unspecified: Secondary | ICD-10-CM | POA: Diagnosis present

## 2019-11-04 DIAGNOSIS — F332 Major depressive disorder, recurrent severe without psychotic features: Secondary | ICD-10-CM | POA: Diagnosis not present

## 2019-11-04 DIAGNOSIS — I110 Hypertensive heart disease with heart failure: Secondary | ICD-10-CM | POA: Diagnosis present

## 2019-11-04 DIAGNOSIS — I2510 Atherosclerotic heart disease of native coronary artery without angina pectoris: Secondary | ICD-10-CM | POA: Diagnosis present

## 2019-11-04 DIAGNOSIS — Z955 Presence of coronary angioplasty implant and graft: Secondary | ICD-10-CM

## 2019-11-04 DIAGNOSIS — J9601 Acute respiratory failure with hypoxia: Secondary | ICD-10-CM | POA: Diagnosis not present

## 2019-11-04 DIAGNOSIS — F17210 Nicotine dependence, cigarettes, uncomplicated: Secondary | ICD-10-CM | POA: Diagnosis present

## 2019-11-04 DIAGNOSIS — R45851 Suicidal ideations: Secondary | ICD-10-CM | POA: Diagnosis present

## 2019-11-04 DIAGNOSIS — E1165 Type 2 diabetes mellitus with hyperglycemia: Secondary | ICD-10-CM | POA: Diagnosis present

## 2019-11-04 DIAGNOSIS — Z833 Family history of diabetes mellitus: Secondary | ICD-10-CM | POA: Diagnosis not present

## 2019-11-04 DIAGNOSIS — E43 Unspecified severe protein-calorie malnutrition: Secondary | ICD-10-CM | POA: Diagnosis present

## 2019-11-04 DIAGNOSIS — I739 Peripheral vascular disease, unspecified: Secondary | ICD-10-CM | POA: Diagnosis present

## 2019-11-04 DIAGNOSIS — Z89519 Acquired absence of unspecified leg below knee: Secondary | ICD-10-CM

## 2019-11-04 DIAGNOSIS — G4700 Insomnia, unspecified: Secondary | ICD-10-CM | POA: Diagnosis present

## 2019-11-04 DIAGNOSIS — Z7901 Long term (current) use of anticoagulants: Secondary | ICD-10-CM

## 2019-11-04 DIAGNOSIS — Z794 Long term (current) use of insulin: Secondary | ICD-10-CM | POA: Diagnosis not present

## 2019-11-04 DIAGNOSIS — E1151 Type 2 diabetes mellitus with diabetic peripheral angiopathy without gangrene: Secondary | ICD-10-CM | POA: Diagnosis present

## 2019-11-04 DIAGNOSIS — S88112A Complete traumatic amputation at level between knee and ankle, left lower leg, initial encounter: Secondary | ICD-10-CM | POA: Diagnosis present

## 2019-11-04 DIAGNOSIS — I509 Heart failure, unspecified: Secondary | ICD-10-CM | POA: Diagnosis present

## 2019-11-04 DIAGNOSIS — I10 Essential (primary) hypertension: Secondary | ICD-10-CM | POA: Diagnosis present

## 2019-11-04 DIAGNOSIS — Z79899 Other long term (current) drug therapy: Secondary | ICD-10-CM

## 2019-11-04 DIAGNOSIS — I480 Paroxysmal atrial fibrillation: Secondary | ICD-10-CM | POA: Diagnosis present

## 2019-11-04 DIAGNOSIS — E119 Type 2 diabetes mellitus without complications: Secondary | ICD-10-CM

## 2019-11-04 DIAGNOSIS — F1410 Cocaine abuse, uncomplicated: Secondary | ICD-10-CM | POA: Diagnosis present

## 2019-11-04 LAB — PHOSPHORUS: Phosphorus: 3.5 mg/dL (ref 2.5–4.6)

## 2019-11-04 LAB — BASIC METABOLIC PANEL
Anion gap: 7 (ref 5–15)
BUN: <5 mg/dL — ABNORMAL LOW (ref 8–23)
CO2: 29 mmol/L (ref 22–32)
Calcium: 8.1 mg/dL — ABNORMAL LOW (ref 8.9–10.3)
Chloride: 103 mmol/L (ref 98–111)
Creatinine, Ser: 0.65 mg/dL (ref 0.61–1.24)
GFR calc Af Amer: >60 mL/min (ref >60)
GFR calc non Af Amer: >60 mL/min (ref >60)
Glucose, Bld: 135 mg/dL — ABNORMAL HIGH (ref 70–99)
Potassium: 3.6 mmol/L (ref 3.5–5.1)
Sodium: 139 mmol/L (ref 135–145)

## 2019-11-04 LAB — GLUCOSE, CAPILLARY
Glucose-Capillary: 166 mg/dL — ABNORMAL HIGH (ref 70–99)
Glucose-Capillary: 111 mg/dL — ABNORMAL HIGH (ref 70–99)
Glucose-Capillary: 211 mg/dL — ABNORMAL HIGH (ref 70–99)
Glucose-Capillary: 346 mg/dL — ABNORMAL HIGH (ref 70–99)
Glucose-Capillary: 288 mg/dL — ABNORMAL HIGH (ref 70–99)

## 2019-11-04 LAB — MAGNESIUM: Magnesium: 2 mg/dL (ref 1.7–2.4)

## 2019-11-04 MED ORDER — ENSURE ENLIVE PO LIQD
237.0000 mL | Freq: Two times a day (BID) | ORAL | Status: DC
  Administered 2019-11-05 – 2019-11-10 (×7): 237 mL via ORAL

## 2019-11-04 MED ORDER — ROSUVASTATIN CALCIUM 20 MG PO TABS
40.0000 mg | ORAL_TABLET | Freq: Every day | ORAL | Status: DC
  Administered 2019-11-04 – 2019-11-12 (×9): 40 mg via ORAL
  Filled 2019-11-04 (×2): qty 2
  Filled 2019-11-04: qty 1
  Filled 2019-11-04 (×8): qty 2

## 2019-11-04 MED ORDER — INSULIN GLARGINE 100 UNIT/ML ~~LOC~~ SOLN
7.0000 [IU] | Freq: Every day | SUBCUTANEOUS | Status: DC
  Administered 2019-11-04: 7 [IU] via SUBCUTANEOUS
  Filled 2019-11-04 (×2): qty 0.07

## 2019-11-04 MED ORDER — ADULT MULTIVITAMIN W/MINERALS CH
1.0000 | ORAL_TABLET | Freq: Every day | ORAL | Status: DC
  Administered 2019-11-05 – 2019-11-13 (×9): 1 via ORAL
  Filled 2019-11-04 (×9): qty 1

## 2019-11-04 MED ORDER — THIAMINE HCL 100 MG PO TABS: 100.0000 mg | ORAL_TABLET | Freq: Every day | ORAL | 0 refills | Status: DC

## 2019-11-04 MED ORDER — APIXABAN 5 MG PO TABS
5.0000 mg | ORAL_TABLET | Freq: Two times a day (BID) | ORAL | Status: DC
  Administered 2019-11-04 – 2019-11-13 (×18): 5 mg via ORAL
  Filled 2019-11-04 (×19): qty 1

## 2019-11-04 MED ORDER — ESCITALOPRAM OXALATE 10 MG PO TABS
5.0000 mg | ORAL_TABLET | Freq: Every day | ORAL | Status: DC
  Administered 2019-11-05 – 2019-11-13 (×9): 5 mg via ORAL
  Filled 2019-11-04 (×9): qty 1

## 2019-11-04 MED ORDER — INSULIN ASPART 100 UNIT/ML ~~LOC~~ SOLN
0.0000 [IU] | Freq: Three times a day (TID) | SUBCUTANEOUS | Status: DC
  Administered 2019-11-04: 5 [IU] via SUBCUTANEOUS
  Administered 2019-11-04: 7 [IU] via SUBCUTANEOUS
  Administered 2019-11-05: 5 [IU] via SUBCUTANEOUS
  Administered 2019-11-05: 3 [IU] via SUBCUTANEOUS
  Administered 2019-11-05: 12:00:00 2 [IU] via SUBCUTANEOUS
  Administered 2019-11-05: 22:00:00 9 [IU] via SUBCUTANEOUS
  Administered 2019-11-06 (×2): 5 [IU] via SUBCUTANEOUS
  Administered 2019-11-06: 22:00:00 9 [IU] via SUBCUTANEOUS
  Administered 2019-11-07: 5 [IU] via SUBCUTANEOUS
  Administered 2019-11-07 (×2): 2 [IU] via SUBCUTANEOUS
  Administered 2019-11-07: 22:00:00 7 [IU] via SUBCUTANEOUS
  Administered 2019-11-08: 5 [IU] via SUBCUTANEOUS
  Administered 2019-11-08: 2 [IU] via SUBCUTANEOUS
  Administered 2019-11-08: 22:00:00 7 [IU] via SUBCUTANEOUS
  Administered 2019-11-08: 17:00:00 5 [IU] via SUBCUTANEOUS
  Administered 2019-11-09 (×3): 9 [IU] via SUBCUTANEOUS
  Administered 2019-11-09 – 2019-11-10 (×2): 5 [IU] via SUBCUTANEOUS
  Administered 2019-11-10: 12:00:00 9 [IU] via SUBCUTANEOUS
  Filled 2019-11-04 (×22): qty 1

## 2019-11-04 MED ORDER — FAMOTIDINE 20 MG PO TABS
20.0000 mg | ORAL_TABLET | Freq: Two times a day (BID) | ORAL | Status: DC
  Administered 2019-11-04 – 2019-11-13 (×19): 20 mg via ORAL
  Filled 2019-11-04 (×19): qty 1

## 2019-11-04 MED ORDER — HYDROXYZINE HCL 25 MG PO TABS: 25.0000 mg | ORAL_TABLET | Freq: Three times a day (TID) | ORAL | Status: DC | PRN

## 2019-11-04 MED ORDER — FOLIC ACID 1 MG PO TABS
1.0000 mg | ORAL_TABLET | Freq: Every day | ORAL | Status: DC
  Administered 2019-11-05 – 2019-11-13 (×9): 1 mg via ORAL
  Filled 2019-11-04 (×9): qty 1

## 2019-11-04 MED ORDER — CARVEDILOL 6.25 MG PO TABS
3.1250 mg | ORAL_TABLET | Freq: Two times a day (BID) | ORAL | Status: DC
  Administered 2019-11-04 – 2019-11-08 (×8): 3.125 mg via ORAL
  Filled 2019-11-04 (×8): qty 1

## 2019-11-04 MED ORDER — MAGNESIUM HYDROXIDE 400 MG/5ML PO SUSP: 30.0000 mL | Freq: Every day | ORAL | Status: DC | PRN

## 2019-11-04 MED ORDER — ALUM & MAG HYDROXIDE-SIMETH 200-200-20 MG/5ML PO SUSP
30.0000 mL | ORAL | Status: DC | PRN
  Administered 2019-11-10: 30 mL via ORAL
  Filled 2019-11-04: qty 30

## 2019-11-04 MED ORDER — INSULIN GLARGINE 100 UNIT/ML ~~LOC~~ SOLN: 7.0000 [IU] | Freq: Every day | SUBCUTANEOUS | 11 refills | Status: DC

## 2019-11-04 MED ORDER — THIAMINE HCL 100 MG PO TABS
100.0000 mg | ORAL_TABLET | Freq: Every day | ORAL | Status: DC
  Administered 2019-11-05 – 2019-11-13 (×9): 100 mg via ORAL
  Filled 2019-11-04 (×9): qty 1

## 2019-11-04 NOTE — TOC Transition Note (Signed)
Transition of Care [facility]) - CM/SW Discharge Note   Patient Details  Name: Tysen Olivia MRN: [id number] Date of Birth: Jan 14, 1956  Transition of Care [facility]) CM/SW Contact:  Su Hilt, RN Phone Number: 11/04/2019, 12:58 PM   Clinical Narrative:    Patient to admit to Inpatient psych today         Patient Goals and CMS Choice        Discharge Placement                       Discharge Plan and Services                                     Social Determinants of Health (SDOH) Interventions     Readmission Risk Interventions Readmission Risk Prevention Plan 03/02/2019  Transportation Screening Complete  HRI or Home Care Consult Complete  Palliative Care Screening Complete  Medication Review (RN Care Manager) Complete  Some recent data might be hidden

## 2019-11-04 NOTE — Progress Notes (Signed)
DISCHARGE NOTE:  Report called to [place], RN @ [facility], pt going to room 301.

## 2019-11-04 NOTE — Tx Team (Signed)
Initial Treatment Plan 11/04/2019 4:57 PM Richard [name] [id number]    PATIENT STRESSORS: Financial difficulties Marital or family conflict Substance abuse   PATIENT STRENGTHS: Ability for insight Active sense of humor Average or above average intelligence Communication skills   PATIENT IDENTIFIED PROBLEMS: Depression 11/04/19  Placement /Homeless 11/04/19  Substance Abuse  11/04/19  Girl friend  Breakup 11/04/19               DISCHARGE CRITERIA:  Ability to meet basic life and health needs Improved stabilization in mood, thinking, and/or behavior Medical problems require only outpatient monitoring  PRELIMINARY DISCHARGE PLAN: Outpatient therapy Placement in alternative living arrangements  PATIENT/FAMILY INVOLVEMENT: This treatment plan has been presented to and reviewed with the patient, Richard [name], and/or family member,   .  The patient and family have been given the opportunity to ask questions and make suggestions.  Leodis Liverpool, RN 11/04/2019, 4:57 PM

## 2019-11-04 NOTE — Plan of Care (Signed)
  Problem: Education: Goal: Knowledge of [facility] Education information/materials will improve Outcome: Not Progressing   Problem: Education: Goal: Knowledge of the prescribed therapeutic regimen will improve Outcome: Not Progressing  New Admission Problem: Medication: Goal: Compliance with prescribed medication regimen will improve Outcome: Not Progressing    Problem: Safety: Goal: Periods of time without injury will increase Outcome: Not Progressing   Problem: Coping: Goal: Ability to verbalize frustrations and anger appropriately will improve Outcome: Not Progressing Goal: Ability to demonstrate self-control will improve Outcome: Not Progressing   Problem: Education: Goal: Knowledge of [facility] Education information/materials will improve Outcome: Not Progressing

## 2019-11-04 NOTE — BH Assessment (Signed)
Patient is to be admitted to [facility] by Dr. Ainsley Spinner.  Attending Physician will be Dr. Weber Cooks.   Patient has been assigned to room 301, by [place].   Intake Paper Work has been signed and placed on patient chart.

## 2019-11-04 NOTE — Discharge Summary (Signed)
[facility] Discharge Summary   Patient: Richard [name] JJK:[id number]   PCP: Patient, No Pcp Per DOB: [birth year]   Date of admission: 10/29/2019   Date of discharge:  11/04/2019    Discharge Diagnoses:  Principal diagnosis pnuemonia   Active Problems:   Cocaine abuse ([place])   Acute respiratory failure ([place])   Major depressive disorder, recurrent episode, moderate ([place])   Admitted From: home Disposition:  [facility]   Recommendations for Outpatient Follow-up:  1. PCP: please follow up with PCP in 1 week 2. Follow up LABS/TEST:  none   Diet recommendation: Cardiac diet  Activity: The patient is advised to gradually reintroduce usual activities,as tolerated  Discharge Condition: good  Code Status: Full code   History of present illness: As per the H and P dictated on admission, "Richard [name] is a 64 year old male with a past medical history notable for polysubstance abuse, CHF, hypertension, diabetes mellitus, A. fib who presents to [facility] ED on 10/29/2019 after being found on the ground, confused and altered in his house today.  It is unknown of his last normal well time.  Upon EMS arrival he was found to be febrile, tachycardic, confused.  No known seizure-like activity.  Upon arrival to the ED he continued to exhibit fever, confusion, along with tremors.  ED provider discussed with patient's significant other Richard [name], who reported that the patient was doing drugs all last night.  While in the ED he was noted to be vomiting, so he was intubated for airway protection due to concern for ongoing aspiration.  Initial work-up in the ED revealed glucose 445, anion gap 18, high-sensitivity troponin 34, lactic acid 5.3, WBC 9.9, hemoglobin 11.4, CK 169.  Venous blood gas with pH 7.39 / CO2 42 / pO2 <31 / Bicarb 25.4.  His COVID-19 PCR is negative, influenza PCR is negative.  Urinalysis is negative for UTI, however positive for ketones.  Urine drug screen is positive for cocaine.  CT head is negative for any  acute intracranial abnormality, chest x-ray with bilateral perihilar opacities concerning for possible pneumonia.  He met sepsis protocol therefore he received IV fluids and broad-spectrum antibiotics.   PCCM was contacted to admit the patient to ICU for further work-up and treatment of acute hypoxic respiratory failure in the setting of  aspiration pneumonitis secondary to acute cocaine toxicity with significant metabolic encephalopathy with seizure-like activity.  Patient also noted to be in DKA."  Hospital Course:  Summary of his active problems in the hospital is as following. Aspiration pneumonia ([place]) Severe sepsis [facility]) Patient presented with acute respiratory failure with hypoxemia requiring intubation.  Sepsis currently resolved. Now on room air  treated with empiric IV Unasyn.  (completed 7 days)  Active problems Uncontrolled type 2 diabetes mellitus with DKA DKA resolved.  Now on Lantus and sliding scale insulin.  Lantus dose reduced given low blood glucose.  Severe depression with suicidal thoughts. Psych consult appreciated and placed on low-dose Lexapro.   transferred to psych service Pt medically cleared   Polysubstance abuse/alcohol abuse No signs of withdrawal.  Counseled on alcohol and cocaine cessation.  Essential hypertension Home medication list shows patient only on Coreg which I have resumed today.  Generalized weakness PT evaluation.  Paroxysmal A. fib Rate controlled.  On Eliquis.  Will resume Coreg.  Hypokalemia Replenished  Severe malnutrition   Nutrition Problem: Severe Malnutrition Etiology: chronic illness(h/o pancreatic caner, CHF, DM, substance abuse) Nutrition Interventions: Interventions: Refer to RD note for recommendations Patient was ambulatory without any  assistance. On the day of the discharge the patient's vitals were stable, and no other acute medical condition were reported by patient. the patient was felt safe to be discharge  at [facility] with no therapy needed on discharge.  Consultants: psychiatry  Procedures: none  DISCHARGE MEDICATION: Allergies as of 11/04/2019   No Known Allergies     Medication List    STOP taking these medications   doxycycline 100 MG capsule Commonly known as: VIBRAMYCIN   oxyCODONE-acetaminophen 5-325 MG tablet Commonly known as: PERCOCET/ROXICET     TAKE these medications   Accu-Chek Aviva Plus test strip Generic drug: glucose blood U ONCE TO BID UTD   Accu-Chek Aviva Plus w/Device Kit U TO TEST ONCE TO BID   Accu-Chek Softclix Lancets lancets TEST 1-2 XD   acetaminophen 325 MG tablet Commonly known as: TYLENOL Take 1 tablet (325 mg total) by mouth every 6 (six) hours as needed for mild pain (or Fever >/= 101).   apixaban 5 MG Tabs tablet Commonly known as: ELIQUIS Take 1 tablet (5 mg total) by mouth 2 (two) times daily.   carvedilol 3.125 MG tablet Commonly known as: COREG Take 1 tablet (3.125 mg total) by mouth 2 (two) times daily with a meal.   Ensure Max Protein Liqd Take 330 mLs (11 oz total) by mouth 2 (two) times daily between meals.   gabapentin 100 MG capsule Commonly known as: NEURONTIN Take 1 capsule (100 mg total) by mouth 3 (three) times daily. What changed:   when to take this  reasons to take this   insulin aspart 100 UNIT/ML injection Commonly known as: novoLOG Inject 0-5 Units into the skin at bedtime. CBG < 70: implement hypoglycemia protocol CBG 70 - 120: 0 units CBG 121 - 150: 0 units CBG 151 - 200: 0 units CBG 201 - 250: 2 units CBG 251 - 300: 3 units CBG 301 - 350: 4 units CBG 351 - 400: 5 units CBG > 400: call MD   insulin aspart 100 UNIT/ML injection Commonly known as: novoLOG Inject 10 Units into the skin 3 (three) times daily with meals.   insulin glargine 100 UNIT/ML injection Commonly known as: LANTUS Inject 0.07 mLs (7 Units total) into the skin daily. What changed: how much to take   multivitamin with minerals  Tabs tablet Take 1 tablet by mouth daily.   Needles & Syringes Misc Use as directed   pantoprazole 40 MG tablet Commonly known as: Protonix Take 1 tablet (40 mg total) by mouth daily.   rosuvastatin 40 MG tablet Commonly known as: CRESTOR Take 1 tablet (40 mg total) by mouth daily at 6 PM.   thiamine 100 MG tablet Take 1 tablet (100 mg total) by mouth daily. Start taking on: November 05, 2019   ticagrelor 90 MG Tabs tablet Commonly known as: BRILINTA Take 1 tablet (90 mg total) by mouth 2 (two) times daily.      No Known Allergies Discharge Instructions    Diet - low sodium heart healthy   Complete by: As directed    Increase activity slowly   Complete by: As directed      Discharge Exam: Filed Weights   11/01/19 0500 11/02/19 0345 11/03/19 0500  Weight: 61 kg 64 kg 76.7 kg   Vitals:   11/04/19 0608 11/04/19 0816  BP: (!) 164/91 (!) 144/81  Pulse: 79 80  Resp: 14 16  Temp: 98.2 F (36.8 C) 98.3 F (36.8 C)  SpO2: 96% 97%  General: Appear in mild distress, no Rash; Oral Mucosa Clear, moist. no Abnormal Mass Or lumps Cardiovascular: S1 and S2 Present, no Murmur, Respiratory: normal respiratory effort, Bilateral Air entry present and Clear to Auscultation, no Crackles, no wheezes Abdomen: Bowel Sound present, Soft and no tenderness, no hernia Extremities: no Pedal edema, no calf tenderness Neurology: alert and oriented to time, place, and person affect appropriate.  The results of significant diagnostics from this hospitalization (including imaging, microbiology, ancillary and laboratory) are listed below for reference.    Significant Diagnostic Studies: EEG  Result Date: 10/30/2019 Lora Havens, MD     10/30/2019  3:12 PM Patient Name: Richard [name] MRN: [id number] Epilepsy Attending: Lora Havens Referring Physician/Provider: Darel Hong, NP Date: 10/30/2019 Duration: 22.57 minutes Patient history: 64 year old male with cocaine use who presented with  respiratory distress and was noted to have seizure-like activity.  EEG to evaluate for seizures. Level of alertness: Comatose AEDs during EEG study: Propofol Technical aspects: This EEG study was done with scalp electrodes positioned according to the 10-20 International system of electrode placement. Electrical activity was acquired at a sampling rate of 500Hz  and reviewed with a high frequency filter of 70Hz  and a low frequency filter of 1Hz . EEG data were recorded continuously and digitally stored. Description: EEG showed continuous generalized and lateralized right hemisphere, 3 to 5 Hz theta-delta slowing.  Intermittent rhythmic generalized 2 to 3 Hz delta slowing was also noted.  Sharp transients were seen in left frontotemporal region.  Hyperventilation and photic stimulation were not performed. Abnormality -Continuous slow, generalized and lateralized right hemisphere IMPRESSION: This study showed evidence of nonspecific cortical dysfunction in right hemisphere as well as severe diffuse encephalopathy, nonspecific etiology but could be secondary to sedation. No seizures or definite epileptiform discharges were seen throughout the recording.   CT Head Wo Contrast  Result Date: 10/29/2019 CLINICAL DATA:  Unwitnessed fall with altered mental status EXAM: CT HEAD WITHOUT CONTRAST TECHNIQUE: Contiguous axial images were obtained from the base of the skull through the vertex without intravenous contrast. COMPARISON:  Feb 28, 2019 FINDINGS: Brain: Slight diffuse atrophy is stable. Prominence of the cisterna magna is an apparent anatomic variant. There is no intracranial mass, hemorrhage, extra-axial fluid collection, or midline shift. There is small vessel disease in the centra semiovale bilaterally, stable. No evident acute infarct. A small focus of basal ganglia calcification on the right is likely physiologic. Vascular: There is no hyperdense vessel. There are foci of calcification in each carotid siphon  region. Skull: The bony calvarium appears intact. Sinuses/Orbits: There is opacification in multiple ethmoid air cells. There is opacification in the anterior, lateral left sphenoid sinus. Other visualized paranasal sinuses are clear. Visualized orbits appear symmetric bilaterally. Other: Visualized mastoid air cells are clear. IMPRESSION: 1. Stable mild atrophy with periventricular small vessel disease. No acute infarct. No mass or hemorrhage. 2.  Foci of arterial vascular calcification noted. 3.  Multiple foci of paranasal sinus disease. Electronically Signed   By: Lowella Grip III M.D.   On: 10/29/2019 10:45   CT ABDOMEN PELVIS W CONTRAST  Result Date: 10/29/2019 CLINICAL DATA:  Acute abdominal pain, neutropenia EXAM: CT ABDOMEN AND PELVIS WITH CONTRAST TECHNIQUE: Multidetector CT imaging of the abdomen and pelvis was performed using the standard protocol following bolus administration of intravenous contrast. CONTRAST:  162m OMNIPAQUE IOHEXOL 300 MG/ML  SOLN COMPARISON:  09/05/2017 FINDINGS: Lower chest: Patchy right lower lobe airspace disease and right middle lobe airspace disease concerning for pneumonia. Hepatobiliary: No  focal liver abnormality is seen. No gallstones, gallbladder wall thickening, or biliary dilatation. Pancreas: Unremarkable. No pancreatic ductal dilatation or surrounding inflammatory changes. Spleen: Prior splenectomy. Adrenals/Urinary Tract: Adrenal glands are unremarkable. Kidneys are normal, without renal calculi, focal lesion, or hydronephrosis. Bladder is unremarkable. Stomach/Bowel: Gastric distension with an air-fluid level. Appendix appears normal. No evidence of bowel wall thickening, distention, or inflammatory changes. Vascular/Lymphatic: Normal caliber abdominal aorta with mild atherosclerosis. No lymphadenopathy. Reproductive: Prostate is unremarkable. Other: No abdominal wall hernia or abnormality. No abdominopelvic ascites. Musculoskeletal: No acute osseous  abnormality. No aggressive osseous lesion. IMPRESSION: 1. Patchy right lower lobe airspace disease and right middle lobe airspace disease concerning for pneumonia. 2. Gastric air-fluid level without evidence of definite gastric outlet obstruction. This appearance can be seen with dysmotility/gastroparesis. 3.  Aortic Atherosclerosis (ICD10-I70.0). Electronically Signed   By: Kathreen Devoid   On: 10/29/2019 13:28   DG Chest Port 1 View  Result Date: 10/30/2019 CLINICAL DATA:  64 year old male with acute respiratory failure. EXAM: PORTABLE CHEST 1 VIEW COMPARISON:  Chest radiograph dated 10/29/2019. FINDINGS: Endotracheal tube with tip approximately 3.5 cm above the carina an enteric tube extending below the diaphragm with tip beyond the inferior margin of the image. Bilateral streaky and confluent densities similar or slightly worsened compared to the prior radiograph and may represent worsening edema or pneumonia. Clinical correlation is recommended. There is no pleural effusion or pneumothorax. The cardiac silhouette is within normal limits. Atherosclerotic calcification of the aorta. No acute osseous pathology. IMPRESSION: 1. Endotracheal tube with tip above the carina. 2. Bilateral streaky and confluent densities, similar or slightly worsened compared to the prior radiograph and may represent worsening edema or pneumonia. Electronically Signed   By: Anner Crete M.D.   On: 10/30/2019 03:58   DG Chest Portable 1 View  Result Date: 10/29/2019 CLINICAL DATA:  Intubated EXAM: PORTABLE CHEST 1 VIEW COMPARISON:  Chest radiograph from earlier today. FINDINGS: Endotracheal tube tip is 3.2 cm above the carina. Enteric tube terminates in the proximal stomach. Stable cardiomediastinal silhouette with normal heart size. No pneumothorax. No pleural effusion. Mild hazy bilateral parahilar lung opacities appear slightly worsened. Pacer pad overlies the heart and left chest. IMPRESSION: 1. Well-positioned endotracheal  and enteric tubes. 2. Slightly increased mild hazy bilateral parahilar lung opacities, cannot exclude mild pulmonary edema. Electronically Signed   By: Ilona Sorrel M.D.   On: 10/29/2019 15:10   DG Chest Port 1 View  Result Date: 10/29/2019 CLINICAL DATA:  Fever. EXAM: PORTABLE CHEST 1 VIEW COMPARISON:  03/09/2019.  11/11/2018. FINDINGS: Interim removal of previously identified right IJ line. Mediastinum and hilar structures normal. Mild bilateral interstitial prominence. Pneumonitis cannot be excluded. No pleural effusion or pneumothorax. IMPRESSION: Mild bilateral interstitial prominence. Pneumonitis cannot be excluded. Electronically Signed   By: Marcello Moores  Register   On: 10/29/2019 10:35   DG Abd Portable 1 View  Result Date: 10/29/2019 CLINICAL DATA:  Enteric tube placement EXAM: PORTABLE ABDOMEN - 1 VIEW COMPARISON:  08/25/2017 abdominal radiograph FINDINGS: Enteric tube terminates in the proximal stomach. No dilated small bowel loops. No evidence of pneumatosis or pneumoperitoneum. Excreted contrast noted in the renal collecting systems bilaterally. IMPRESSION: Enteric tube terminates in the proximal stomach. Electronically Signed   By: Ilona Sorrel M.D.   On: 10/29/2019 15:11    Microbiology: Recent Results (from the past 240 hour(s))  Blood Culture (routine x 2)     Status: Abnormal   Collection Time: 10/29/19 10:16 AM   Specimen: BLOOD  Result  Value Ref Range Status   Specimen Description   Final    BLOOD LEFT AC Performed at [facility], [address], [place], [place] [address]    Special Requests   Final    BOTTLES DRAWN AEROBIC AND ANAEROBIC Blood Culture adequate volume Performed at [facility], [address] [place], [place] [address]    Culture  Setup Time   Final    GRAM POSITIVE COCCI ANAEROBIC BOTTLE ONLY CRITICAL RESULT CALLED TO, READ BACK BY AND VERIFIED WITH: Hart Robinsons [facility] AT 2778 10/30/19 SDR Performed at [facility], [address] [place], [place] [address]    Culture (A)  Final    STAPHYLOCOCCUS SPECIES (COAGULASE NEGATIVE) THE SIGNIFICANCE OF ISOLATING THIS ORGANISM FROM A SINGLE SET OF BLOOD CULTURES WHEN MULTIPLE SETS ARE DRAWN IS UNCERTAIN. PLEASE NOTIFY THE MICROBIOLOGY DEPARTMENT WITHIN ONE WEEK IF SPECIATION AND SENSITIVITIES ARE REQUIRED. Performed at [facility], [place] [address] [place], [place] [address]    Report Status 11/01/2019 FINAL  Final  Blood Culture (routine x 2)     Status: None   Collection Time: 10/29/19 10:16 AM   Specimen: BLOOD  Result Value Ref Range Status   Specimen Description BLOOD LEFT ARM  Final   Special Requests   Final    BOTTLES DRAWN AEROBIC AND ANAEROBIC Blood Culture adequate volume   Culture   Final    NO GROWTH 5 DAYS Performed at [facility], [address], [place], [place] [address]    Report Status 11/03/2019 FINAL  Final  Respiratory Panel by RT PCR (Flu A&B, Covid) - Nasopharyngeal Swab     Status: None   Collection Time: 10/29/19 10:16 AM   Specimen: Nasopharyngeal Swab  Result Value Ref Range Status   SARS Coronavirus 2 by RT PCR NEGATIVE NEGATIVE Final    Comment: (NOTE) SARS-CoV-2 target nucleic acids are NOT DETECTED. The SARS-CoV-2 RNA is generally detectable in upper respiratoy specimens during the acute phase of infection. The lowest concentration of SARS-CoV-2 viral copies this assay can detect is 131 copies/mL. A negative result does not preclude SARS-Cov-2 infection and should not be used as the sole basis for treatment or other patient management decisions. A negative result may occur with  improper specimen collection/handling, submission of specimen other than nasopharyngeal swab, presence of viral mutation(s) within the areas targeted by this assay, and inadequate number of viral copies (<131 copies/mL). A negative result must be combined with clinical observations, patient history, and epidemiological information.  The expected result is Negative. Fact Sheet for Patients:  PinkCheek.be Fact Sheet for Healthcare Providers:  GravelBags.it This test is not yet ap proved or cleared by the [place] FDA and  has been authorized for detection and/or diagnosis of SARS-CoV-2 by FDA under an Emergency Use Authorization (EUA). This EUA will remain  in effect (meaning this test can be used) for the duration of the COVID-19 declaration under Section 564(b)(1) of the Act, 21 U.S.C. section 360bbb-3(b)(1), unless the authorization is terminated or revoked sooner.    Influenza A by PCR NEGATIVE NEGATIVE Final   Influenza B by PCR NEGATIVE NEGATIVE Final    Comment: (NOTE) The Xpert Xpress SARS-CoV-2/FLU/RSV assay is intended as an aid in  the diagnosis of influenza from Nasopharyngeal swab specimens and  should not be used as a sole basis for treatment. Nasal washings and  aspirates are unacceptable for Xpert Xpress SARS-CoV-2/FLU/RSV  testing. Fact Sheet for Patients: PinkCheek.be Fact Sheet  for Healthcare Providers: GravelBags.it This test is not yet approved or cleared by the [place] and  has been authorized for detection and/or diagnosis of SARS-CoV-2 by  FDA under an Emergency Use Authorization (EUA). This EUA will remain  in effect (meaning this test can be used) for the duration of the  Covid-19 declaration under Section 564(b)(1) of the Act, 21  U.S.C. section 360bbb-3(b)(1), unless the authorization is  terminated or revoked. Performed at [facility], [address], [place], [place] [address]   Urine culture     Status: None   Collection Time: 10/29/19 11:35 AM   Specimen: In/Out Cath Urine  Result Value Ref Range Status   Specimen Description   Final    IN/OUT CATH URINE Performed at [facility], [address] [place], [place] [address]     Special Requests   Final    NONE Performed at [facility], [address], [place], [place] [address]    Culture   Final    NO GROWTH Performed at [facility], [place] [address] [place], [place] [address]    Report Status 10/30/2019 FINAL  Final  MRSA PCR Screening     Status: None   Collection Time: 10/29/19  8:20 PM   Specimen: Nasopharyngeal  Result Value Ref Range Status   MRSA by PCR NEGATIVE NEGATIVE Final    Comment:        The GeneXpert MRSA Assay (FDA approved for NASAL specimens only), is one component of a comprehensive MRSA colonization surveillance program. It is not intended to diagnose MRSA infection nor to guide or monitor treatment for MRSA infections. Performed at [facility], [address] [place], [place] [address]      Labs: CBC: Recent Labs  Lab 10/29/19 1016 10/30/19 0258 10/31/19 0437 11/01/19 0431 11/02/19 0609  WBC 9.9 11.1* 11.9* 11.2* 10.6*  NEUTROABS 7.5  --   --   --  7.3  HGB 11.4* 10.1* 9.4* 9.0* 9.4*  HCT 34.7* 31.1* 29.0* 28.3* 28.6*  MCV 88.1 88.9 89.2 89.6 88.5  PLT 292 225 211 216 388   Basic Metabolic Panel: Recent Labs  Lab 10/31/19 0437 11/01/19 0431 11/02/19 0609 11/03/19 0437 11/04/19 0612  NA 142 140 139 136 139  K 3.3* 3.2* 3.3* 4.0 3.6  CL 107 107 107 104 103  CO2 22 26 23 25 29   GLUCOSE 108* 152* 56* 227* 135*  BUN 16 14 8  5* <5*  CREATININE 0.79 0.67 0.66 0.59* 0.65  CALCIUM 8.2* 7.8* 8.0* 8.0* 8.1*  MG 1.7 2.1 2.0 2.0 2.0  PHOS 2.0* 2.7 1.9* 3.4 3.5   Liver Function Tests: Recent Labs  Lab 10/29/19 1016  AST 23  ALT 15  ALKPHOS 29*  BILITOT 1.1  PROT 7.4  ALBUMIN 4.0   No results for input(s): LIPASE, AMYLASE in the last 168 hours. No results for input(s): AMMONIA in the last 168 hours. Cardiac Enzymes: Recent Labs  Lab 10/29/19 1156  CKTOTAL 169   BNP (last 3 results) Recent Labs    10/30/19 0258  BNP 135.0*   CBG: Recent Labs  Lab 11/03/19 1644  11/03/19 2124 11/04/19 0245 11/04/19 0815 11/04/19 1140  GLUCAP 280* 228* 166* 111* 211*    Time spent: 35 minutes  Signed:  Berle Mull  [facility]  11/04/2019 12:00 PM

## 2019-11-04 NOTE — Progress Notes (Signed)
Physical Therapy Treatment Patient Details Name: Richard [name] MRN: [id number] DOB: 05/02/1956 Today's Date: 11/04/2019    History of Present Illness Richard [name] is a 18yoM who comes to [facility] on 1/7 after being found on the ground, confused and altered in his house today. Patient's significant other Richard [name] reported that the patient was doing drugs all last night. intubated for airway protection 2/2 emesis. admit the patient to ICU for further work-up and treatment of acute hypoxic respiratory failure in the setting of  aspiration pneumonitis secondary to acute cocaine toxicity with significant metabolic encephalopathy with seizure-like activity.  Patient also noted to be in DKA. Extubated to RA on 1/10. PMH: polysubstance abuse, CHF, hypertension, diabetes mellitus, A. fib    PT Comments    Pt in bed upon entry, awake and agreeable to participate. Pt assisted with donning of shoe for time, and assist with donning of Left BKA prosthesis. 4 attempts at gel sleeve alignment correction to allow for correct pin position to lock into prosthesis. Mod to come to EOB, then minA to rise with heavy effort. Pt has no frank LOB but uses RW effectively for self stabilization. Noted safe RW use for slow AMB in hall. Pt denies dizziness problem akin to yesterday, but is deconditioned and unable to fully circumnavigate the unit. Pt agreeable to sit up in recliner at end of session, all needs met, sitter in room. Pt agreeable to leave BKA prosthesis on for a while which is advisable. Pt remains deconditioned and would benefit from continued PT services after transfer to [place] this date.      Follow Up Recommendations  SNF;Supervision for mobility/OOB     Equipment Recommendations  Wheelchair (measurements PT);Wheelchair cushion (measurements PT);Other (comment)    Recommendations for Other Services       Precautions / Restrictions Precautions Precautions: Fall Precaution Comments: SI precautions Required  Braces or Orthoses: (Left BKA prosthesis; Rt shoe) Restrictions RLE Weight Bearing: Weight bearing as tolerated LLE Weight Bearing: Weight bearing as tolerated    Mobility  Bed Mobility Overal bed mobility: Needs Assistance Bed Mobility: Supine to Sit     Supine to sit: Mod assist     General bed mobility comments: fairly weak, puts forth good effort  Transfers Overall transfer level: Needs assistance Equipment used: Rolling walker (2 wheeled) Transfers: Stand Pivot Transfers;Sit to/from Stand Sit to Stand: Min assist Stand pivot transfers: Min assist          Ambulation/Gait Ambulation/Gait assistance: Min guard Gait Distance (Feet): 140 Feet Assistive device: Rolling walker (2 wheeled) Gait Pattern/deviations: Decreased step length - right;Decreased stance time - left Gait velocity: 0.71ms       Stairs             Wheelchair Mobility    Modified Rankin (Stroke Patients Only)       Balance Overall balance assessment: History of Falls;Mild deficits observed, not formally tested;Needs assistance Sitting-balance support: No upper extremity supported;Feet unsupported Sitting balance-Leahy Scale: Normal     Standing balance support: During functional activity;Bilateral upper extremity supported Standing balance-Leahy Scale: Fair                              Cognition Arousal/Alertness: Awake/alert Behavior During Therapy: WFL for tasks assessed/performed Overall Cognitive Status: Within Functional Limits for tasks assessed  Exercises      General Comments        Pertinent Vitals/Pain Pain Assessment: 0-10 Pain Score: 6  Pain Location: LLE residual limb and popliteal fossa Pain Descriptors / Indicators: Aching Pain Intervention(s): Limited activity within patient's tolerance    Home Living                      Prior Function            PT Goals (current  goals can now be found in the care plan section) Acute Rehab PT Goals Patient Stated Goal: regain strength improve AMB PT Goal Formulation: With patient Time For Goal Achievement: 11/17/19 Potential to Achieve Goals: Good Progress towards PT goals: Progressing toward goals    Frequency    Min 2X/week      PT Plan Current plan remains appropriate    Co-evaluation              AM-PAC PT "6 Clicks" Mobility   Outcome Measure  Help needed turning from your back to your side while in a flat bed without using bedrails?: A Little Help needed moving from lying on your back to sitting on the side of a flat bed without using bedrails?: A Lot Help needed moving to and from a bed to a chair (including a wheelchair)?: A Lot Help needed standing up from a chair using your arms (e.g., wheelchair or bedside chair)?: A Little Help needed to walk in hospital room?: A Little Help needed climbing 3-5 steps with a railing? : A Little 6 Click Score: 16    End of Session Equipment Utilized During Treatment: Gait belt Activity Tolerance: Treatment limited secondary to medical complications (Comment);Patient limited by fatigue;Patient limited by lethargy Patient left: in bed;with call bell/phone within reach;with bed alarm set Nurse Communication: Mobility status PT Visit Diagnosis: Unsteadiness on feet (R26.81);Difficulty in walking, not elsewhere classified (R26.2);Muscle weakness (generalized) (M62.81);History of falling (Z91.81);Other abnormalities of gait and mobility (R26.89)     Time: 9909-4000 PT Time Calculation (min) (ACUTE ONLY): 26 min  Charges:  $Therapeutic Exercise: 23-37 mins                     2:41 PM, 11/04/19 Etta Grandchild, PT, DPT Physical Therapist - [facility]  [phone] ([place])    Huntley C 11/04/2019, 2:37 PM
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
## 2019-11-04 NOTE — Progress Notes (Signed)
Pharmacy Electrolyte Monitoring Consult:  Pharmacy consulted to assist in monitoring and replacing electrolytes in this 64 y.o. male admitted on 10/29/2019.   Labs:  Sodium (mmol/L)  Date Value  11/04/2019 139   Potassium (mmol/L)  Date Value  11/04/2019 3.6   Magnesium (mg/dL)  Date Value  11/04/2019 2.0   Phosphorus (mg/dL)  Date Value  11/04/2019 3.5   Calcium (mg/dL)  Date Value  11/04/2019 8.1 (L)   Albumin (g/dL)  Date Value  10/29/2019 4.0    Assessment/Plan: No replenishment warranted today or yesterday  Pharmacy will sign off at this time.  Lu Duffel, PharmD, BCPS Clinical Pharmacist 11/04/2019 7:07 AM

## 2019-11-05 DIAGNOSIS — F332 Major depressive disorder, recurrent severe without psychotic features: Secondary | ICD-10-CM

## 2019-11-05 LAB — GLUCOSE, CAPILLARY
Glucose-Capillary: 228 mg/dL — ABNORMAL HIGH (ref 70–99)
Glucose-Capillary: 193 mg/dL — ABNORMAL HIGH (ref 70–99)
Glucose-Capillary: 299 mg/dL — ABNORMAL HIGH (ref 70–99)
Glucose-Capillary: 368 mg/dL — ABNORMAL HIGH (ref 70–99)

## 2019-11-05 MED ORDER — INSULIN ASPART 100 UNIT/ML ~~LOC~~ SOLN
3.0000 [IU] | Freq: Three times a day (TID) | SUBCUTANEOUS | Status: DC
  Administered 2019-11-05 – 2019-11-09 (×12): 3 [IU] via SUBCUTANEOUS
  Filled 2019-11-05 (×11): qty 1

## 2019-11-05 MED ORDER — MIRTAZAPINE 15 MG PO TABS
15.0000 mg | ORAL_TABLET | Freq: Every day | ORAL | Status: DC
  Administered 2019-11-05 – 2019-11-06 (×2): 15 mg via ORAL
  Filled 2019-11-05 (×2): qty 1

## 2019-11-05 MED ORDER — INSULIN GLARGINE 100 UNIT/ML ~~LOC~~ SOLN
10.0000 [IU] | Freq: Every day | SUBCUTANEOUS | Status: DC
  Administered 2019-11-05 – 2019-11-08 (×4): 10 [IU] via SUBCUTANEOUS
  Filled 2019-11-05 (×5): qty 0.1

## 2019-11-05 MED ORDER — GABAPENTIN 300 MG PO CAPS
300.0000 mg | ORAL_CAPSULE | Freq: Three times a day (TID) | ORAL | Status: DC
  Administered 2019-11-05 – 2019-11-13 (×25): 300 mg via ORAL
  Filled 2019-11-05 (×25): qty 1

## 2019-11-05 NOTE — Consult Note (Signed)
[facility] Face-to-Face Psychiatry Consult   Reason for Consult:  Depression Referring Physician:  Dr Mortimer Fries Patient Identification: Richard [name] MRN:  [id number] Principal Diagnosis: Acute respiratory failure Diagnosis:  Active Problems:   MDD (major depressive disorder)  Total Time spent with patient: 1 hour   HPI per Gust Rung"  Subjective:   Richard [name] is a 64 y.o. male patient admitted with respiratory failure r/t.  Patient seen and evaluated in person by this provider.  He reports being depressed and having suicidal ideations, no clear plan.  He is upset because his girlfriend of 20 years does not want him to return to live with her as she wants a better life, according to him.  Reports using cocaine 1-2 times a week and previous alcohol dependence.  His son lives in the [place] area and his daughter lives in town.  No homicidal ideations or hallucinations.  Unable to care for himself at this time and disturbed by his current health status and prognosis.  He is agreeable to starting antidepressant as he does feel like he needs it, no past psychiatric treatment or history besides polysubstance abuse.  Recommendation placed in treatment plan below.  HPI per MD:  Mr. Richard [name] is a 64 year old male with a past medical history notable for polysubstance abuse, CHF, hypertension, diabetes mellitus, A. fib who presents to [facility] ED on 10/29/2019 after being found on the ground, confused and altered in his house today.  It is unknown of his last normal well time.  Upon EMS arrival he was found to be febrile, tachycardic, confused.  No known seizure-like activity.  Upon arrival to the ED he continued to exhibit fever, confusion, along with tremors.  ED provider discussed with patient's significant other [name], who reported that the patient was doing drugs all last night.  While in the ED he was noted to be vomiting, so he was intubated for airway protection due to concern for ongoing aspiration.  Initial work-up in  the ED revealed glucose 445, anion gap 18, high-sensitivity troponin 34, lactic acid 5.3, WBC 9.9, hemoglobin 11.4, CK 169.  Venous blood gas with pH 7.39 / CO2 42 / pO2 <31 / Bicarb 25.4.  His COVID-19 PCR is negative, influenza PCR is negative.  Urinalysis is negative for UTI, however positive for ketones.  Urine drug screen is positive for cocaine.  CT head is negative for any acute intracranial abnormality, chest x-ray with bilateral perihilar opacities concerning for possible pneumonia.  He met sepsis protocol therefore he received IV fluids and broad-spectrum antibiotics.   PCCM was contacted to admit the patient to ICU for further work-up and treatment of acute hypoxic respiratory failure in the setting of  aspiration pneumonitis secondary to acute cocaine toxicity with significant metabolic encephalopathy with seizure-like activity.  Patient also noted to be in DKA."  Past Psychiatric History:  Polysubstance dependence  Risk to Self:  none Risk to Others:  none Prior Inpatient Therapy:  none Prior Outpatient Therapy:  none  Past Medical History:  Past Medical History:  Diagnosis Date  . Atrial fibrillation ([place])    per patient  . Cancer ([place])   . CHF (congestive heart failure) ([place])   . Chronic back pain   . Chronic leg pain   . Coronary artery disease   . Diabetes mellitus without complication ([place])   . Hypertension   . Neuropathy     Past Surgical History:  Procedure Laterality Date  . AMPUTATION Left 03/13/2019   Procedure: AMPUTATION BELOW KNEE;  Surgeon: Algernon Huxley, MD;  Location: [facility] ORS;  Service: Vascular;  Laterality: Left;  . CORONARY ANGIOPLASTY WITH STENT PLACEMENT    . EMBOLECTOMY Left 03/12/2019   Procedure: EMBOLECTOMY SFA POPLITEAL;  Surgeon: Algernon Huxley, MD;  Location: [facility] ORS;  Service: Vascular;  Laterality: Left;  . ENDARTERECTOMY FEMORAL Left 03/12/2019   Procedure: ENDARTERECTOMY FEMORAL;  Surgeon: Algernon Huxley, MD;  Location: [facility] ORS;  Service:  Vascular;  Laterality: Left;  . ESOPHAGOGASTRODUODENOSCOPY N/A 09/11/2017   Procedure: ESOPHAGOGASTRODUODENOSCOPY (EGD);  Surgeon: Virgel Manifold, MD;  Location: [facility] ENDOSCOPY;  Service: Endoscopy;  Laterality: N/A;  . LOWER EXTREMITY ANGIOGRAPHY Left 11/17/2018   Procedure: Lower Extremity Angiography, with possible intervention;  Surgeon: Algernon Huxley, MD;  Location: [place] CV LAB;  Service: Cardiovascular;  Laterality: Left;  . LOWER EXTREMITY ANGIOGRAPHY Left 03/04/2019   Procedure: Lower Extremity Angiography;  Surgeon: Algernon Huxley, MD;  Location: [place] CV LAB;  Service: Cardiovascular;  Laterality: Left;  . LOWER EXTREMITY ANGIOGRAPHY Left 07/01/2019   Procedure: LOWER EXTREMITY ANGIOGRAPHY;  Surgeon: Algernon Huxley, MD;  Location: [place] CV LAB;  Service: Cardiovascular;  Laterality: Left;  . pancreas removed     Family History:  Family History  Problem Relation Age of Onset  . CAD Mother   . Diabetes Mellitus II Mother   . Diabetes Mellitus II Father    Family Psychiatric  History: none Social History:  Social History   Substance and Sexual Activity  Alcohol Use No  . Alcohol/week: 0.0 standard drinks     Social History   Substance and Sexual Activity  Drug Use Yes  . Types: Cocaine, Benzodiazepines   Comment: Has used in the past-heroin. Cocaine used over a year ago per pt.    Social History   Socioeconomic History  . Marital status: Single    Spouse name: Not on file  . Number of children: Not on file  . Years of education: Not on file  . Highest education level: Not on file  Occupational History  . Not on file  Tobacco Use  . Smoking status: Current Every Day Smoker    Packs/day: 0.50    Years: 50.00    Pack years: 25.00  . Smokeless tobacco: Never Used  Substance and Sexual Activity  . Alcohol use: No    Alcohol/week: 0.0 standard drinks  . Drug use: Yes    Types: Cocaine, Benzodiazepines    Comment: Has used in the  past-heroin. Cocaine used over a year ago per pt.  . Sexual activity: Not Currently  Other Topics Concern  . Not on file  Social History Narrative  . Not on file   Social Determinants of Health   Financial Resource Strain:   . Difficulty of Paying Living Expenses: Not on file  Food Insecurity:   . Worried About Charity fundraiser in the Last Year: Not on file  . Ran Out of Food in the Last Year: Not on file  Transportation Needs:   . Lack of Transportation (Medical): Not on file  . Lack of Transportation (Non-Medical): Not on file  Physical Activity:   . Days of Exercise per Week: Not on file  . Minutes of Exercise per Session: Not on file  Stress:   . Feeling of Stress : Not on file  Social Connections:   . Frequency of Communication with Friends and Family: Not on file  . Frequency of Social Gatherings with Friends and Family: Not  on file  . Attends Religious Services: Not on file  . Active Member of Clubs or Organizations: Not on file  . Attends Archivist Meetings: Not on file  . Marital Status: Not on file   Additional Social History:    Allergies:  No Known Allergies  Labs:  Results for orders placed or performed during the hospital encounter of 11/04/19 (from the past 48 hour(s))  Glucose, capillary     Status: Abnormal   Collection Time: 11/04/19  4:44 PM  Result Value Ref Range   Glucose-Capillary 346 (H) 70 - 99 mg/dL  Glucose, capillary     Status: Abnormal   Collection Time: 11/04/19  9:32 PM  Result Value Ref Range   Glucose-Capillary 288 (H) 70 - 99 mg/dL  Glucose, capillary     Status: Abnormal   Collection Time: 11/05/19  6:52 AM  Result Value Ref Range   Glucose-Capillary 228 (H) 70 - 99 mg/dL    Current Facility-Administered Medications  Medication Dose Route Frequency Provider Last Rate Last Admin  . alum & mag hydroxide-simeth (MAALOX/MYLANTA) 200-200-20 MG/5ML suspension 30 mL  30 mL Oral Q4H PRN Travas Schexnayder, Dorene Ar, MD      .  apixaban (ELIQUIS) tablet 5 mg  5 mg Oral Q12H Tamerra Merkley, Dorene Ar, MD   5 mg at 11/05/19 0823  . carvedilol (COREG) tablet 3.125 mg  3.125 mg Oral BID WC Addilyne Backs, Dorene Ar, MD   3.125 mg at 11/05/19 0821  . escitalopram (LEXAPRO) tablet 5 mg  5 mg Oral Daily Harland Aguiniga, Dorene Ar, MD   5 mg at 11/05/19 0821  . famotidine (PEPCID) tablet 20 mg  20 mg Oral BID Jamahl Lemmons, Dorene Ar, MD   20 mg at 11/05/19 0821  . feeding supplement (ENSURE ENLIVE) (ENSURE ENLIVE) liquid 237 mL  237 mL Oral BID BM Khaniyah Bezek A, MD      . folic acid (FOLVITE) tablet 1 mg  1 mg Oral Daily Chey Cho A, MD   1 mg at 11/05/19 4503  . hydrOXYzine (ATARAX/VISTARIL) tablet 25 mg  25 mg Oral TID PRN Geonna Lockyer A, MD      . insulin aspart (novoLOG) injection 0-9 Units  0-9 Units Subcutaneous TID AC & HS Goldy Calandra, Dorene Ar, MD   3 Units at 11/05/19 0654  . insulin glargine (LANTUS) injection 7 Units  7 Units Subcutaneous QHS Michi Herrmann, Dorene Ar, MD   7 Units at 11/04/19 2230  . magnesium hydroxide (MILK OF MAGNESIA) suspension 30 mL  30 mL Oral Daily PRN Amaiyah Nordhoff, Dorene Ar, MD      . multivitamin with minerals tablet 1 tablet  1 tablet Oral Daily Jalexis Breed, Dorene Ar, MD   1 tablet at 11/05/19 0821  . rosuvastatin (CRESTOR) tablet 40 mg  40 mg Oral q1800 Sabriya Yono, Dorene Ar, MD   40 mg at 11/04/19 1740  . thiamine tablet 100 mg  100 mg Oral Daily Diontay Rosencrans, Dorene Ar, MD   100 mg at 11/05/19 8882    Musculoskeletal: Strength & Muscle Tone: decreased Gait & Station: unable to stand Patient leans: N/A  Psychiatric Specialty Exam: Physical Exam  Nursing note and vitals reviewed. Constitutional: He is oriented to person, place, and time. He appears well-developed and well-nourished.  HENT:  Head: Normocephalic.  Respiratory: Effort normal.  Musculoskeletal:        General: Normal range of motion.     Cervical back: Normal range of motion.  Neurological: He is alert and oriented to person,  place, and time.   Psychiatric: His speech is normal and behavior is normal. Judgment normal. His mood appears anxious. Cognition and memory are normal. He exhibits a depressed mood. He expresses suicidal ideation.    Review of Systems  Psychiatric/Behavioral: Positive for dysphoric mood and suicidal ideas. The patient is nervous/anxious.   All other systems reviewed and are negative.   Blood pressure (!) 148/77, pulse 79, temperature 98.9 F (37.2 C), temperature source Oral, resp. rate 17, height 5' 11"  (1.803 m), SpO2 97 %.Body mass index is 23.57 kg/m.  General Appearance: Casual  Eye Contact:  Good  Speech:  Normal Rate  Volume:  Normal  Mood:  Anxious and Depressed  Affect:  Congruent  Thought Process:  Coherent and Descriptions of Associations: Intact  Orientation:  Full (Time, Place, and Person)  Thought Content:  Rumination  Suicidal Thoughts:  Yes.  without intent/plan  Homicidal Thoughts:  No  Memory:  Immediate;   Fair Recent;   Fair Remote;   Fair  Judgement:  Fair  Insight:  Fair  Psychomotor Activity:  Decreased  Concentration:  Concentration: Fair and Attention Span: Fair  Recall:  [employer] of Knowledge:  Fair  Language:  Fair  Akathisia:  No  Handed:  Right  AIMS (if indicated):     Assets:  Leisure Time Resilience Social Support  ADL's:  Impaired  Cognition:  WNL  Sleep:  Number of Hours: 78.30   64 year old male admitted for acute respiratory failure related to aspiration, history of polysubstance abuse.  Positive for cocaine on admission and after being extubated reported depression with a desire to die.  He has chronic medical issues with a poor prognosis.  Agreeable to start antidepressants see recommendation below  Treatment Plan Summary: Patient to be admitted to inpatient hospital for safety stabilization and medication management  -Recommend Lexapro 5 mg daily  Disposition: Admit to inpatient  Dixie Dials, MD 11/05/2019 9:18 AM  Case discussed and  plan agreed upon as outlined above.

## 2019-11-05 NOTE — Progress Notes (Signed)
Patient ID: Richard [name], male   DOB: September 03, 1956, 64 y.o.   MRN: [id number] Patient can be allowed to keep shoelaces as they are crucial to his stability ambulating.  Patient is not threatening or behaving in an acutely suicidal or erratic manner.

## 2019-11-05 NOTE — BHH Group Notes (Signed)
LCSW Group Therapy Note  11/05/2019 11:35 AM  Type of Therapy/Topic:  Group Therapy:  Balance in Life  Participation Level:  Minimal  Description of Group:    This group will address the concept of balance and how it feels and looks when one is unbalanced. Patients will be encouraged to process areas in their lives that are out of balance and identify reasons for remaining unbalanced. Facilitators will guide patients in utilizing problem-solving interventions to address and correct the stressor making their life unbalanced. Understanding and applying boundaries will be explored and addressed for obtaining and maintaining a balanced life. Patients will be encouraged to explore ways to assertively make their unbalanced needs known to significant others in their lives, using other group members and facilitator for support and feedback.  Therapeutic Goals: 1. Patient will identify two or more emotions or situations they have that consume much of in their lives. 2. Patient will identify signs/triggers that life has become out of balance:  3. Patient will identify two ways to set boundaries in order to achieve balance in their lives:  4. Patient will demonstrate ability to communicate their needs through discussion and/or role plays  Summary of Patient Progress: Pt was present in group and appeared to be attentive. Pt identified liking to go by the water as a coping skill for him. Pt did not engage much in the group discussion until the end when coping skills were discussed.     Therapeutic Modalities:   Cognitive Behavioral Therapy Solution-Focused Therapy Assertiveness Training  Evalina Field, MSW, LCSW Clinical Social Work 11/05/2019 11:35 AM

## 2019-11-05 NOTE — BHH Suicide Risk Assessment (Signed)
[facility] Admission Suicide Risk Assessment   Nursing information obtained from:  Patient Demographic factors:  Male, Low socioeconomic status, Unemployed Current Mental Status:  Suicidal ideation indicated by others Loss Factors:  Loss of significant relationship, Decline in physical health, Financial problems / change in socioeconomic status Historical Factors:  NA Risk Reduction Factors:  Positive coping skills or problem solving skills  Total Time spent with patient: 1 hour Principal Problem: MDD (major depressive disorder) Diagnosis:  Principal Problem:   MDD (major depressive disorder) Active Problems:   Diabetes ([place])   HTN (hypertension)   Cocaine abuse ([place])   Below-knee amputation of left lower extremity (HCC)   PAD (peripheral artery disease) ([place])  Subjective Data: Patient seen and chart reviewed.  64 year old man transferred to the medical service after stabilization.  He was found unconscious at home passed out.  Patient suffers from major depression and is still reporting some suicidal thoughts and feelings of hopelessness but does not feel he will act on it in the hospital and is not reporting psychotic symptoms  Continued Clinical Symptoms:  Alcohol Use Disorder Identification Test Final Score (AUDIT): 0 The "Alcohol Use Disorders Identification Test", Guidelines for Use in Primary Care, Second Edition.  World Pharmacologist [facility]). Score between 0-7:  no or low risk or alcohol related problems. Score between 8-15:  moderate risk of alcohol related problems. Score between 16-19:  high risk of alcohol related problems. Score 20 or above:  warrants further diagnostic evaluation for alcohol dependence and treatment.   CLINICAL FACTORS:   Depression:   Comorbid alcohol abuse/dependence Alcohol/Substance Abuse/Dependencies   Musculoskeletal: Strength & Muscle Tone: within normal limits Gait & Station: Patient is status post below-knee amputation of his left leg.  He  has gotten weak over the last few months and is still not expert with the prosthesis and so is unstable on his feet Patient leans: Left  Psychiatric Specialty Exam: Physical Exam  Nursing note and vitals reviewed. Constitutional: He appears well-developed and well-nourished.  HENT:  Head: Normocephalic and atraumatic.  Eyes: Pupils are equal, round, and reactive to light. Conjunctivae are normal.  Cardiovascular: Regular rhythm and normal heart sounds.  Respiratory: Effort normal. No respiratory distress.  GI: Soft.  Musculoskeletal:        General: Normal range of motion.     Cervical back: Normal range of motion.  Neurological: He is alert.  Skin: Skin is warm and dry.  Psychiatric: Judgment normal. His affect is blunt. His speech is delayed. He is slowed. Cognition and memory are normal. He exhibits a depressed mood. He expresses suicidal ideation. He expresses no suicidal plans.    Review of Systems  Constitutional: Positive for appetite change and unexpected weight change.  HENT: Negative.   Eyes: Negative.   Respiratory: Negative.   Cardiovascular: Negative.   Gastrointestinal: Negative.   Musculoskeletal: Negative.   Skin: Negative.   Neurological: Negative.   Psychiatric/Behavioral: Positive for dysphoric mood, sleep disturbance and suicidal ideas.    Blood pressure (!) 148/77, pulse 79, temperature 98.9 F (37.2 C), temperature source Oral, resp. rate 17, height 5\' 11"  (1.803 m), SpO2 97 %.Body mass index is 23.57 kg/m.  General Appearance: Casual  Eye Contact:  Fair  Speech:  Slow  Volume:  Decreased  Mood:  Depressed  Affect:  Congruent  Thought Process:  Coherent  Orientation:  Full (Time, Place, and Person)  Thought Content:  Logical  Suicidal Thoughts:  Yes.  without intent/plan  Homicidal Thoughts:  No  Memory:  Immediate;   Fair Recent;   Fair Remote;   Fair  Judgement:  Fair  Insight:  Fair  Psychomotor Activity:  Decreased  Concentration:   Concentration: Fair  Recall:  [employer] of Knowledge:  Fair  Language:  Fair  Akathisia:  No  Handed:  Right  AIMS (if indicated):     Assets:  Desire for Improvement Resilience  ADL's:  Impaired  Cognition:  WNL  Sleep:  Number of Hours: 7.75      COGNITIVE FEATURES THAT CONTRIBUTE TO RISK:  Thought constriction (tunnel vision)    SUICIDE RISK:   Mild:  Suicidal ideation of limited frequency, intensity, duration, and specificity.  There are no identifiable plans, no associated intent, mild dysphoria and related symptoms, good self-control (both objective and subjective assessment), few other risk factors, and identifiable protective factors, including available and accessible social support.  PLAN OF CARE: Continue 15-minute checks.  Engage patient in therapeutic groups and activities.  Assess with full treatment team.  Discussed with patient medical treatment of depression including starting antidepressants.  Reassess suicidality before finding safe discharge planning  I certify that inpatient services furnished can reasonably be expected to improve the patient's condition.   Alethia Berthold, MD 11/05/2019, 2:50 PM

## 2019-11-05 NOTE — Tx Team (Signed)
Interdisciplinary Treatment and Diagnostic Plan Update  11/05/2019 Time of Session: 9:00AM  Richard [name] MRN: [id number]  Principal Diagnosis: <principal problem not specified>  Secondary Diagnoses: Active Problems:   MDD (major depressive disorder)   Current Medications:  Current Facility-Administered Medications  Medication Dose Route Frequency Provider Last Rate Last Admin  . alum & mag hydroxide-simeth (MAALOX/MYLANTA) 200-200-20 MG/5ML suspension 30 mL  30 mL Oral Q4H PRN Cristofano, Dorene Ar, MD      . apixaban (ELIQUIS) tablet 5 mg  5 mg Oral Q12H Cristofano, Dorene Ar, MD   5 mg at 11/05/19 0823  . carvedilol (COREG) tablet 3.125 mg  3.125 mg Oral BID WC Cristofano, Dorene Ar, MD   3.125 mg at 11/05/19 0821  . escitalopram (LEXAPRO) tablet 5 mg  5 mg Oral Daily Cristofano, Dorene Ar, MD   5 mg at 11/05/19 0821  . famotidine (PEPCID) tablet 20 mg  20 mg Oral BID Cristofano, Dorene Ar, MD   20 mg at 11/05/19 0821  . feeding supplement (ENSURE ENLIVE) (ENSURE ENLIVE) liquid 237 mL  237 mL Oral BID BM Cristofano, Paul A, MD   237 mL at 11/05/19 1030  . folic acid (FOLVITE) tablet 1 mg  1 mg Oral Daily Cristofano, Paul A, MD   1 mg at 11/05/19 6503  . hydrOXYzine (ATARAX/VISTARIL) tablet 25 mg  25 mg Oral TID PRN Cristofano, Paul A, MD      . insulin aspart (novoLOG) injection 0-9 Units  0-9 Units Subcutaneous TID AC & HS Cristofano, Dorene Ar, MD   2 Units at 11/05/19 1139  . insulin glargine (LANTUS) injection 7 Units  7 Units Subcutaneous QHS Cristofano, Dorene Ar, MD   7 Units at 11/04/19 2230  . magnesium hydroxide (MILK OF MAGNESIA) suspension 30 mL  30 mL Oral Daily PRN Cristofano, Dorene Ar, MD      . multivitamin with minerals tablet 1 tablet  1 tablet Oral Daily Cristofano, Dorene Ar, MD   1 tablet at 11/05/19 0821  . rosuvastatin (CRESTOR) tablet 40 mg  40 mg Oral q1800 Cristofano, Dorene Ar, MD   40 mg at 11/04/19 1740  . thiamine tablet 100 mg  100 mg Oral Daily Cristofano, Dorene Ar, MD   100 mg at  11/05/19 5465   PTA Medications: Medications Prior to Admission  Medication Sig Dispense Refill Last Dose  . ACCU-CHEK AVIVA PLUS test strip U ONCE TO BID UTD 100 each 0   . ACCU-CHEK SOFTCLIX LANCETS lancets TEST 1-2 XD 100 each 0   . acetaminophen (TYLENOL) 325 MG tablet Take 1 tablet (325 mg total) by mouth every 6 (six) hours as needed for mild pain (or Fever >/= 101). 30 tablet 0   . apixaban (ELIQUIS) 5 MG TABS tablet Take 1 tablet (5 mg total) by mouth 2 (two) times daily. (Patient not taking: Reported on 10/29/2019) 60 tablet 0   . Blood Glucose Monitoring Suppl (ACCU-CHEK AVIVA PLUS) w/Device KIT U TO TEST ONCE TO BID 1 kit 1   . carvedilol (COREG) 3.125 MG tablet Take 1 tablet (3.125 mg total) by mouth 2 (two) times daily with a meal. (Patient not taking: Reported on 10/29/2019)     . Ensure Max Protein (ENSURE MAX PROTEIN) LIQD Take 330 mLs (11 oz total) by mouth 2 (two) times daily between meals. (Patient not taking: Reported on 10/29/2019)     . gabapentin (NEURONTIN) 100 MG capsule Take 1 capsule (100 mg total) by mouth 3 (three) times daily. (Patient  taking differently: Take 100 mg by mouth daily as needed. ) 180 capsule 3   . insulin aspart (NOVOLOG) 100 UNIT/ML injection Inject 0-5 Units into the skin at bedtime. CBG < 70: implement hypoglycemia protocol CBG 70 - 120: 0 units CBG 121 - 150: 0 units CBG 151 - 200: 0 units CBG 201 - 250: 2 units CBG 251 - 300: 3 units CBG 301 - 350: 4 units CBG 351 - 400: 5 units CBG > 400: call MD 10 mL 11   . insulin aspart (NOVOLOG) 100 UNIT/ML injection Inject 10 Units into the skin 3 (three) times daily with meals. 10 mL 11   . insulin glargine (LANTUS) 100 UNIT/ML injection Inject 0.07 mLs (7 Units total) into the skin daily. 10 mL 11   . Multiple Vitamin (MULTIVITAMIN WITH MINERALS) TABS tablet Take 1 tablet by mouth daily. 30 tablet 1   . Needles & Syringes MISC Use as directed (Patient not taking: Reported on 10/29/2019) 100 each 3   .  pantoprazole (PROTONIX) 40 MG tablet Take 1 tablet (40 mg total) by mouth daily. (Patient not taking: Reported on 10/29/2019) 30 tablet 0   . rosuvastatin (CRESTOR) 40 MG tablet Take 1 tablet (40 mg total) by mouth daily at 6 PM. (Patient not taking: Reported on 10/29/2019)     . thiamine 100 MG tablet Take 1 tablet (100 mg total) by mouth daily. 30 tablet 0   . ticagrelor (BRILINTA) 90 MG TABS tablet Take 1 tablet (90 mg total) by mouth 2 (two) times daily. (Patient not taking: Reported on 10/29/2019) 60 tablet      Patient Stressors: Financial difficulties Marital or family conflict Substance abuse  Patient Strengths: Ability for insight Active sense of humor Average or above average intelligence Communication skills  Treatment Modalities: Medication Management, Group therapy, Case management,  1 to 1 session with clinician, Psychoeducation, Recreational therapy.   Physician Treatment Plan for Primary Diagnosis: <principal problem not specified> Long Term Goal(s):     Short Term Goals:    Medication Management: Evaluate patient's response, side effects, and tolerance of medication regimen.  Therapeutic Interventions: 1 to 1 sessions, Unit Group sessions and Medication administration.  Evaluation of Outcomes: Progressing  Physician Treatment Plan for Secondary Diagnosis: Active Problems:   MDD (major depressive disorder)  Long Term Goal(s):     Short Term Goals:       Medication Management: Evaluate patient's response, side effects, and tolerance of medication regimen.  Therapeutic Interventions: 1 to 1 sessions, Unit Group sessions and Medication administration.  Evaluation of Outcomes: Progressing   RN Treatment Plan for Primary Diagnosis: <principal problem not specified> Long Term Goal(s): Knowledge of disease and therapeutic regimen to maintain health will improve  Short Term Goals: Ability to remain free from injury will improve, Ability to demonstrate self-control,  Ability to participate in decision making will improve, Ability to verbalize feelings will improve, Ability to disclose and discuss suicidal ideas and Ability to identify and develop effective coping behaviors will improve  Medication Management: RN will administer medications as ordered by provider, will assess and evaluate patient's response and provide education to patient for prescribed medication. RN will report any adverse and/or side effects to prescribing provider.  Therapeutic Interventions: 1 on 1 counseling sessions, Psychoeducation, Medication administration, Evaluate responses to treatment, Monitor vital signs and CBGs as ordered, Perform/monitor CIWA, COWS, AIMS and Fall Risk screenings as ordered, Perform wound care treatments as ordered.  Evaluation of Outcomes: Progressing   LCSW Treatment  Plan for Primary Diagnosis: <principal problem not specified> Long Term Goal(s): Safe transition to appropriate next level of care at discharge, Engage patient in therapeutic group addressing interpersonal concerns.  Short Term Goals: Engage patient in aftercare planning with referrals and resources, Increase social support, Increase ability to appropriately verbalize feelings, Facilitate patient progression through stages of change regarding substance use diagnoses and concerns and Identify triggers associated with mental health/substance abuse issues  Therapeutic Interventions: Assess for all discharge needs, 1 to 1 time with Social worker, Explore available resources and support systems, Assess for adequacy in community support network, Educate family and significant other(s) on suicide prevention, Complete Psychosocial Assessment, Interpersonal group therapy.  Evaluation of Outcomes: Progressing   Progress in Treatment: Attending groups: No. Participating in groups: No. Taking medication as prescribed: Yes. Toleration medication: Yes. Family/Significant other contact made: Yes,  individual(s) contacted:  once permission is given. Patient understands diagnosis: Yes. Discussing patient identified problems/goals with staff: Yes. Medical problems stabilized or resolved: Yes. Denies suicidal/homicidal ideation: Yes. Issues/concerns per patient self-inventory: No. Other: none  New problem(s) identified: No, Describe:  none  New Short Term/Long Term Goal(s): detox, medication management for mood stabilization; elimination of SI thoughts; development of comprehensive mental wellness/sobriety plan.  Patient Goals:  "working on getting myself back together and leave them drugs alone".  Discharge Plan or Barriers: Patient reports plans to either return to his home with his girlfriend.  If he can not return there he plans to go to [place] with his daughter. Patient has plans to follow up with [facility] if he remains in the area.  Reason for Continuation of Hospitalization: Anxiety Depression Medical Issues Medication stabilization Suicidal ideation  Estimated Length of Stay: 1-7 days  Attendees: Patient: Richard [name] 11/05/2019 12:11 PM  Physician: Dr. Weber Cooks, MD 11/05/2019 12:11 PM  Nursing:  11/05/2019 12:11 PM  RN Care Manager: 11/05/2019 12:11 PM  Social Worker: Assunta Curtis 11/05/2019 12:11 PM  Recreational Therapist: Devin Going, LRT 11/05/2019 12:11 PM  Other: Sanjuana Kava, LCSW 11/05/2019 12:11 PM  Other: Evalina Field, [name] 11/05/2019 12:11 PM  Other: 11/05/2019 12:11 PM    Scribe for Treatment Team: Rozann Lesches, LCSW 11/05/2019 12:11 PM

## 2019-11-05 NOTE — Plan of Care (Signed)
Pt rates depression and hopelessness both 3/10 and anxiety 1/10. Pt denies SI, HI and AVH. Pt was educated on care plan and verbalizes understanding. Collier Bullock RN Problem: Education: Goal: Knowledge of [place] [facility] Education information/materials will improve Outcome: Progressing   Problem: Coping: Goal: Ability to verbalize frustrations and anger appropriately will improve Outcome: Progressing Goal: Ability to demonstrate self-control will improve Outcome: Progressing   Problem: Safety: Goal: Periods of time without injury will increase Outcome: Progressing   Problem: Education: Goal: Knowledge of the prescribed therapeutic regimen will improve Outcome: Progressing   Problem: Medication: Goal: Compliance with prescribed medication regimen will improve Outcome: Progressing

## 2019-11-05 NOTE — Evaluation (Signed)
Physical Therapy Evaluation Patient Details Name: Richard [name] MRN: [id number] DOB: [birth year] Today's Date: 11/05/2019   History of Present Illness  62yoM who [facility] after being found on the ground, confused and altered in his house today. Patient's significant other [name] reported that the patient was doing drugs all last night. intubated for airway protection 2/2 emesis. admit the patient to ICU for further work-up and treatment of acute hypoxic respiratory failure in the setting of  aspiration pneumonitis secondary to acute cocaine toxicity with significant metabolic encephalopathy with seizure-like activity.  Patient also noted to be in DKA. Extubated to RA on 1/10. PMH: polysubstance abuse, CHF, hypertension, diabetes mellitus, A. fib  Clinical Impression  Pt seen for PT after transfer from medical floor to behavioral health.  Pt did well with PT exam showing functional strength t/o (though b/l UEs were weaker than expected given his consistent use of walker since L BKA (5/20).  He was able to ambulate >300 ft with consistent speed and good confidence.  No LOBs but definite need of walker for safety.  Pt was able to transfer to/from w/c w/o assist and generally is not too far from his baseline.  Pt does speak of hoping to work with PT enough to transition off walker and to [facility], PT can address this while here but an outpatient setting would be more appropriate and better equipped for this to really be an appropriate transition.    Follow Up Recommendations Outpatient PT    Equipment Recommendations  (likely does not need w/c currently, hopes to start [facility])    Recommendations for Other Services       Precautions / Restrictions Precautions Precautions: Fall Restrictions RLE Weight Bearing: Weight bearing as tolerated LLE Weight Bearing: Weight bearing as tolerated      Mobility  Bed Mobility Overal bed mobility: Modified Independent Bed Mobility: Supine to Sit      Supine to sit: Min guard Sit to supine: Min guard   General bed mobility comments: Pt able to get himself to sitting w/o assist, w/o hesitation  Transfers Overall transfer level: Needs assistance Equipment used: Rolling walker (2 wheeled) Transfers: Sit to/from Stand Sit to Stand: Min guard         General transfer comment: Pt initially trying to stand up w/o walker in front of him, unable to attain full standing, CGA with use of UEs/walker   Ambulation/Gait Ambulation/Gait assistance: Supervision Gait Distance (Feet): 300 Feet Assistive device: Rolling walker (2 wheeled)(L prosthetic)       General Gait Details: Pt initially with prosthetic slightly internally rotated, had pt sit in w/c and adjusted alignment with improved posturing with ambulation.  Pt voices desire to transition to [facility] (now ~7 months post-amp)  Stairs            Wheelchair Mobility    Modified Rankin (Stroke Patients Only)       Balance Overall balance assessment: History of Falls;Mild deficits observed, not formally tested;Needs assistance Sitting-balance support: No upper extremity supported;Feet unsupported Sitting balance-Leahy Scale: Normal     Standing balance support: During functional activity;Bilateral upper extremity supported Standing balance-Leahy Scale: Good Standing balance comment: Pt is able to maintain dynamic standing/ambulation with expected level of walker use, no LOBs or overt safety concerns.                             Pertinent Vitals/Pain Pain Assessment: No/denies pain  Home Living Family/patient expects to be discharged to:: Private residence Living Arrangements: Other (Comment)(lives with "my daughter's mom") Available Help at Discharge: Family;Available PRN/intermittently Type of Home: Apartment Home Access: Stairs to enter Entrance Stairs-Rails: Left Entrance Stairs-Number of Steps: 14 Home Layout: One level Home Equipment: Walker - 2  wheels Additional Comments: LLE BKA Prosthesis    Prior Function Level of Independence: Independent with assistive device(s)         Comments: Independent with ADL PTA, limited to household distances with RW - rarely out of the home. Reports ~5 falls in past 6 months d/t falling/LOB.     Hand Dominance   Dominant Hand: Left    Extremity/Trunk Assessment   Upper Extremity Assessment Upper Extremity Assessment: Overall WFL for tasks assessed(functional b/l, though weaker than expected given RW use)    Lower Extremity Assessment Lower Extremity Assessment: Overall WFL for tasks assessed       Communication   Communication: No difficulties  Cognition Arousal/Alertness: Awake/alert Behavior During Therapy: WFL for tasks assessed/performed Overall Cognitive Status: Within Functional Limits for tasks assessed                                        General Comments      Exercises     Assessment/Plan    PT Assessment Patient needs continued PT services  PT Problem List Decreased strength;Decreased range of motion;Decreased activity tolerance;Decreased balance;Decreased mobility;Decreased coordination       PT Treatment Interventions DME instruction;Balance training;Gait training;Stair training;Functional mobility training;Therapeutic activities;Therapeutic exercise;Patient/family education    PT Goals (Current goals can be found in the Care Plan section)  Acute Rehab PT Goals Patient Stated Goal: get walking with a cane and maybe even eventually no AD PT Goal Formulation: With patient Time For Goal Achievement: 11/19/19 Potential to Achieve Goals: Good    Frequency Min 2X/week   Barriers to discharge        Co-evaluation               AM-PAC PT "6 Clicks" Mobility  Outcome Measure Help needed turning from your back to your side while in a flat bed without using bedrails?: None Help needed moving from lying on your back to sitting on the  side of a flat bed without using bedrails?: None Help needed moving to and from a bed to a chair (including a wheelchair)?: A Little Help needed standing up from a chair using your arms (e.g., wheelchair or bedside chair)?: A Little Help needed to walk in hospital room?: A Little Help needed climbing 3-5 steps with a railing? : A Little 6 Click Score: 20    End of Session Equipment Utilized During Treatment: Gait belt Activity Tolerance: Treatment limited secondary to medical complications (Comment);Patient limited by fatigue;Patient limited by lethargy Patient left: (in w/c, pt planning on wheeling around unit) Nurse Communication: Mobility status PT Visit Diagnosis: Unsteadiness on feet (R26.81);Difficulty in walking, not elsewhere classified (R26.2);Muscle weakness (generalized) (M62.81);History of falling (Z91.81);Other abnormalities of gait and mobility (R26.89)    Time: [id number] PT Time Calculation (min) (ACUTE ONLY): 31 min   Charges:   PT Evaluation $PT Eval Low Complexity: 1 Low PT Treatments $Gait Training: 8-22 mins        Kreg Shropshire, DPT 11/05/2019, 4:12 PM

## 2019-11-05 NOTE — Progress Notes (Signed)
Patient alert and oriented x 4, affect is flat but he brightens upon approach, he is polite and pleasant to staff, thoughts are organized and coherent, patient was noted to have BKA to left leg , he has toes missing on his right foot. Patient has a prosthesis leg he use on left lower extremity. Patient is a diabetic blood sugar was checked it was 288 he was medicated with sliding scale as ordered and given Lantus  long acting. Patient is receptive to care, he denies SI/HI/AVH, no distress noted, 15 minutes safety checks maintained.

## 2019-11-05 NOTE — Progress Notes (Signed)
Inpatient Diabetes Program Recommendations  AACE/ADA: New Consensus Statement on Inpatient Glycemic Control (2015)  Target Ranges:  Prepandial:   less than 140 mg/dL      Peak postprandial:   less than 180 mg/dL (1-2 hours)      Critically ill patients:  140 - 180 mg/dL   Lab Results  Component Value Date   GLUCAP 228 (H) 11/05/2019   HGBA1C 14.9 (H) 10/30/2019    Review of Glycemic Control  Results for SIRIS, GRAUS (MRN [id number]) as of 11/05/2019 10:55  Ref. Range 11/04/2019 11:40 11/04/2019 16:44 11/04/2019 21:32 11/05/2019 06:52  Glucose-Capillary Latest Ref Range: 70 - 99 mg/dL 211 (H) 346 (H) 288 (H) 228 (H)   Home DM Meds:Lantus 19 units Daily (NOT taking) Novolog 10 units TID with meals  Current Orders:Lantus 7 units QHS Novolog Sensitive Correction Scale/ SSI (0-9 units) TID   Inpatient Diabetes Program Recommendations:     -Novolog 3 units TID with meals if eats at least 50% of meal -Lantus 10 units daily  Thank you, Reche Dixon, RN, BSN Diabetes Coordinator Inpatient Diabetes Program [phone] (team pager from 8a-5p)
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
## 2019-11-05 NOTE — Progress Notes (Signed)
Recreation Therapy Notes    Date: 11/05/2019  Time: 9:30 am   Location: Craft room   Behavioral response: N/A   Intervention Topic: Goals   Discussion/Intervention: Patient did not attend group.   Clinical Observations/Feedback:  Patient did not attend group.   Lucero Auzenne LRT/CTRS         Inna Tisdell 11/05/2019 11:40 AM

## 2019-11-05 NOTE — H&P (Signed)
Psychiatric Admission Assessment Adult  Patient Identification: Richard [name] MRN:  [id number] Date of Evaluation:  11/05/2019 Chief Complaint:  MDD (major depressive disorder) [F32.9] Principal Diagnosis: MDD (major depressive disorder) Diagnosis:  Principal Problem:   MDD (major depressive disorder) Active Problems:   Diabetes ([place])   HTN (hypertension)   Cocaine abuse ([place])   Below-knee amputation of left lower extremity (HCC)   PAD (peripheral artery disease) ([place])  History of Present Illness: Patient seen and chart reviewed.  64 year old man transferred to [place] from the medical service after stabilization.  He was found passed out in his own home and when brought to the hospital was in DKA and unstable medically.  Patient had psychiatric consult on the medical service and was found to be depressed and having suicidal thoughts.  On interview today the patient says he has been feeling depressed for "a couple of months" although he later tells me that he had suicide attempts last year so it sounds like the depression is longer stay a day then that.  He relates a lot of his depressed mood to the amputation of his left lower leg and part of his right foot.  He feels like since that happened his long-term partner felt that he was "less of a man".  He says his energy level is been very low.  He sleeps very poorly at night with frequent awakenings.  His appetite went away and he has lost a large amount of weight over the last several months.  Patient endorses suicidal thoughts but does not have any current plan to act on it.  He admits that he has been using crack cocaine and that the amount of it has been increasing.  He was seen at least once previously by psychiatric consult in the ER but did not follow-up with any recommended treatment.  He does not report any psychotic symptoms.  He believes that he is no longer welcome to return to live in his home that he had shared with his long-term companion  although it seems like this may still be uncertain Associated Signs/Symptoms: Depression Symptoms:  depressed mood, feelings of worthlessness/guilt, difficulty concentrating, hopelessness, suicidal thoughts without plan, loss of energy/fatigue, disturbed sleep, weight loss, (Hypo) Manic Symptoms:  None reported Anxiety Symptoms:  Specific anxiety about his relationship and life circumstance Psychotic Symptoms:  None reported PTSD Symptoms: Negative Total Time spent with patient: 1 hour  Past Psychiatric History: Patient has no previous psychiatric hospitalizations.  He was seen by a psychiatrist in consultation at least once in the emergency room not long ago but was judged to not need inpatient treatment.  He was recommended for outpatient follow-up but did not follow-up with any mental health treatment.  Has not stayed on antidepressants for a length of time to judge their therapeutic effectiveness.  He says he has had 2 suicide attempts by overdose within the past 1 year.  He denies history of mania or psychosis.  He has struggled with cocaine abuse for years on and off but has never engaged in any treatment.  Denies alcohol abuse  Is the patient at risk to self? Yes.    Has the patient been a risk to self in the past 6 months? Yes.    Has the patient been a risk to self within the distant past? Yes.    Is the patient a risk to others? No.  Has the patient been a risk to others in the past 6 months? No.  Has the patient  been a risk to others within the distant past? No.   Prior Inpatient Therapy:   Prior Outpatient Therapy:    Alcohol Screening: Patient refused Alcohol Screening Tool: Yes 1. How often do you have a drink containing alcohol?: Never 2. How many drinks containing alcohol do you have on a typical day when you are drinking?: 1 or 2 3. How often do you have six or more drinks on one occasion?: Never AUDIT-C Score: 0 4. How often during the last year have you found that  you were not able to stop drinking once you had started?: Never 5. How often during the last year have you failed to do what was normally expected from you becasue of drinking?: Never 6. How often during the last year have you needed a first drink in the morning to get yourself going after a heavy drinking session?: Never 7. How often during the last year have you had a feeling of guilt of remorse after drinking?: Never 8. How often during the last year have you been unable to remember what happened the night before because you had been drinking?: Never 9. Have you or someone else been injured as a result of your drinking?: No 10. Has a relative or friend or a doctor or another health worker been concerned about your drinking or suggested you cut down?: No Alcohol Use Disorder Identification Test Final Score (AUDIT): 0 Alcohol Brief Interventions/Follow-up: AUDIT Score <7 follow-up not indicated Substance Abuse History in the last 12 months:  Yes.   Consequences of Substance Abuse: Medical Consequences:  Worsening and maintenance of depression along with weight loss and poor self-care including poor care of his diabetes Previous Psychotropic Medications: Yes  Psychological Evaluations: Yes  Past Medical History:  Past Medical History:  Diagnosis Date  . Atrial fibrillation ([place])    per patient  . Cancer ([place])   . CHF (congestive heart failure) ([place])   . Chronic back pain   . Chronic leg pain   . Coronary artery disease   . Diabetes mellitus without complication ([place])   . Hypertension   . Neuropathy     Past Surgical History:  Procedure Laterality Date  . AMPUTATION Left 03/13/2019   Procedure: AMPUTATION BELOW KNEE;  Surgeon: Algernon Huxley, MD;  Location: [facility] ORS;  Service: Vascular;  Laterality: Left;  . CORONARY ANGIOPLASTY WITH STENT PLACEMENT    . EMBOLECTOMY Left 03/12/2019   Procedure: EMBOLECTOMY SFA POPLITEAL;  Surgeon: Algernon Huxley, MD;  Location: [facility] ORS;  Service:  Vascular;  Laterality: Left;  . ENDARTERECTOMY FEMORAL Left 03/12/2019   Procedure: ENDARTERECTOMY FEMORAL;  Surgeon: Algernon Huxley, MD;  Location: [facility] ORS;  Service: Vascular;  Laterality: Left;  . ESOPHAGOGASTRODUODENOSCOPY N/A 09/11/2017   Procedure: ESOPHAGOGASTRODUODENOSCOPY (EGD);  Surgeon: Virgel Manifold, MD;  Location: [facility] ENDOSCOPY;  Service: Endoscopy;  Laterality: N/A;  . LOWER EXTREMITY ANGIOGRAPHY Left 11/17/2018   Procedure: Lower Extremity Angiography, with possible intervention;  Surgeon: Algernon Huxley, MD;  Location: [place] CV LAB;  Service: Cardiovascular;  Laterality: Left;  . LOWER EXTREMITY ANGIOGRAPHY Left 03/04/2019   Procedure: Lower Extremity Angiography;  Surgeon: Algernon Huxley, MD;  Location: [place] CV LAB;  Service: Cardiovascular;  Laterality: Left;  . LOWER EXTREMITY ANGIOGRAPHY Left 07/01/2019   Procedure: LOWER EXTREMITY ANGIOGRAPHY;  Surgeon: Algernon Huxley, MD;  Location: [place] CV LAB;  Service: Cardiovascular;  Laterality: Left;  . pancreas removed     Family History:  Family History  Problem Relation Age of Onset  . CAD Mother   . Diabetes Mellitus II Mother   . Diabetes Mellitus II Father    Family Psychiatric  History: Does not know of any Tobacco Screening:   Social History:  Social History   Substance and Sexual Activity  Alcohol Use No  . Alcohol/week: 0.0 standard drinks     Social History   Substance and Sexual Activity  Drug Use Yes  . Types: Cocaine, Benzodiazepines   Comment: Has used in the past-heroin. Cocaine used over a year ago per pt.    Additional Social History: Marital status: Long term relationship Long term relationship, how long?: 20 years What types of issues is patient dealing with in the relationship?: Pt reports "my drug use". Additional relationship information: Pt reports that they recently broke up which is a contributing factor to the patient's admission. Are you sexually active?: No What  is your sexual orientation?: Heterosexual Does patient have children?: Yes How many children?: 3 How is patient's relationship with their children?: Pt reports "good".                         Allergies:  No Known Allergies Lab Results:  Results for orders placed or performed during the hospital encounter of 11/04/19 (from the past 48 hour(s))  Glucose, capillary     Status: Abnormal   Collection Time: 11/04/19  4:44 PM  Result Value Ref Range   Glucose-Capillary 346 (H) 70 - 99 mg/dL  Glucose, capillary     Status: Abnormal   Collection Time: 11/04/19  9:32 PM  Result Value Ref Range   Glucose-Capillary 288 (H) 70 - 99 mg/dL  Glucose, capillary     Status: Abnormal   Collection Time: 11/05/19  6:52 AM  Result Value Ref Range   Glucose-Capillary 228 (H) 70 - 99 mg/dL  Glucose, capillary     Status: Abnormal   Collection Time: 11/05/19 11:28 AM  Result Value Ref Range   Glucose-Capillary 193 (H) 70 - 99 mg/dL    Blood Alcohol level:  Lab Results  Component Value Date   ETH <10 10/29/2019   ETH <10 73/56/7014    Metabolic Disorder Labs:  Lab Results  Component Value Date   HGBA1C 14.9 (H) 10/30/2019   MPG 380.93 10/30/2019   MPG >398 02/28/2019   No results found for: PROLACTIN Lab Results  Component Value Date   CHOL 186 08/10/2015   TRIG 91 10/29/2019   HDL 25 (L) 08/10/2015   CHOLHDL 7.4 08/10/2015   VLDL 29 08/10/2015   LDLCALC 132 (H) 08/10/2015    Current Medications: Current Facility-Administered Medications  Medication Dose Route Frequency Provider Last Rate Last Admin  . alum & mag hydroxide-simeth (MAALOX/MYLANTA) 200-200-20 MG/5ML suspension 30 mL  30 mL Oral Q4H PRN Cristofano, Dorene Ar, MD      . apixaban (ELIQUIS) tablet 5 mg  5 mg Oral Q12H Cristofano, Dorene Ar, MD   5 mg at 11/05/19 0823  . carvedilol (COREG) tablet 3.125 mg  3.125 mg Oral BID WC Cristofano, Dorene Ar, MD   3.125 mg at 11/05/19 0821  . escitalopram (LEXAPRO) tablet 5 mg  5 mg  Oral Daily Cristofano, Dorene Ar, MD   5 mg at 11/05/19 0821  . famotidine (PEPCID) tablet 20 mg  20 mg Oral BID Cristofano, Dorene Ar, MD   20 mg at 11/05/19 0821  . feeding supplement (ENSURE ENLIVE) (ENSURE ENLIVE) liquid 237 mL  237 mL Oral BID BM Cristofano, Paul A, MD   237 mL at 11/05/19 1030  . folic acid (FOLVITE) tablet 1 mg  1 mg Oral Daily Cristofano, Dorene Ar, MD   1 mg at 11/05/19 [id number]  . gabapentin (NEURONTIN) capsule 300 mg  300 mg Oral TID Doralyn Kirkes T, MD      . hydrOXYzine (ATARAX/VISTARIL) tablet 25 mg  25 mg Oral TID PRN Cristofano, Paul A, MD      . insulin aspart (novoLOG) injection 0-9 Units  0-9 Units Subcutaneous TID AC & HS Cristofano, Dorene Ar, MD   2 Units at 11/05/19 1139  . insulin aspart (novoLOG) injection 3 Units  3 Units Subcutaneous TID WC Adrin Julian T, MD      . insulin glargine (LANTUS) injection 10 Units  10 Units Subcutaneous QHS Saim Almanza T, MD      . magnesium hydroxide (MILK OF MAGNESIA) suspension 30 mL  30 mL Oral Daily PRN Cristofano, Paul A, MD      . mirtazapine (REMERON) tablet 15 mg  15 mg Oral QHS Caran Storck T, MD      . multivitamin with minerals tablet 1 tablet  1 tablet Oral Daily Cristofano, Dorene Ar, MD   1 tablet at 11/05/19 [phone]  . rosuvastatin (CRESTOR) tablet 40 mg  40 mg Oral q1800 Cristofano, Dorene Ar, MD   40 mg at 11/04/19 1740  . thiamine tablet 100 mg  100 mg Oral Daily Cristofano, Dorene Ar, MD   100 mg at 11/05/19 3295   PTA Medications: Medications Prior to Admission  Medication Sig Dispense Refill Last Dose  . ACCU-CHEK AVIVA PLUS test strip U ONCE TO BID UTD 100 each 0   . ACCU-CHEK SOFTCLIX LANCETS lancets TEST 1-2 XD 100 each 0   . acetaminophen (TYLENOL) 325 MG tablet Take 1 tablet (325 mg total) by mouth every 6 (six) hours as needed for mild pain (or Fever >/= 101). 30 tablet 0   . apixaban (ELIQUIS) 5 MG TABS tablet Take 1 tablet (5 mg total) by mouth 2 (two) times daily. (Patient not taking: Reported on 10/29/2019) 60 tablet  0   . Blood Glucose Monitoring Suppl (ACCU-CHEK AVIVA PLUS) w/Device KIT U TO TEST ONCE TO BID 1 kit 1   . carvedilol (COREG) 3.125 MG tablet Take 1 tablet (3.125 mg total) by mouth 2 (two) times daily with a meal. (Patient not taking: Reported on 10/29/2019)     . Ensure Max Protein (ENSURE MAX PROTEIN) LIQD Take 330 mLs (11 oz total) by mouth 2 (two) times daily between meals. (Patient not taking: Reported on 10/29/2019)     . gabapentin (NEURONTIN) 100 MG capsule Take 1 capsule (100 mg total) by mouth 3 (three) times daily. (Patient taking differently: Take 100 mg by mouth daily as needed. ) 180 capsule 3   . insulin aspart (NOVOLOG) 100 UNIT/ML injection Inject 0-5 Units into the skin at bedtime. CBG < 70: implement hypoglycemia protocol CBG 70 - 120: 0 units CBG 121 - 150: 0 units CBG 151 - 200: 0 units CBG 201 - 250: 2 units CBG 251 - 300: 3 units CBG 301 - 350: 4 units CBG 351 - 400: 5 units CBG > 400: call MD 10 mL 11   . insulin aspart (NOVOLOG) 100 UNIT/ML injection Inject 10 Units into the skin 3 (three) times daily with meals. 10 mL 11   . insulin glargine (LANTUS) 100 UNIT/ML injection Inject 0.07 mLs (7 Units  total) into the skin daily. 10 mL 11   . Multiple Vitamin (MULTIVITAMIN WITH MINERALS) TABS tablet Take 1 tablet by mouth daily. 30 tablet 1   . Needles & Syringes MISC Use as directed (Patient not taking: Reported on 10/29/2019) 100 each 3   . pantoprazole (PROTONIX) 40 MG tablet Take 1 tablet (40 mg total) by mouth daily. (Patient not taking: Reported on 10/29/2019) 30 tablet 0   . rosuvastatin (CRESTOR) 40 MG tablet Take 1 tablet (40 mg total) by mouth daily at 6 PM. (Patient not taking: Reported on 10/29/2019)     . thiamine 100 MG tablet Take 1 tablet (100 mg total) by mouth daily. 30 tablet 0   . ticagrelor (BRILINTA) 90 MG TABS tablet Take 1 tablet (90 mg total) by mouth 2 (two) times daily. (Patient not taking: Reported on 10/29/2019) 60 tablet      Musculoskeletal: Strength &  Muscle Tone: decreased Gait & Station: unsteady Patient leans: Left  Psychiatric Specialty Exam: Physical Exam  Nursing note and vitals reviewed. Constitutional: He appears well-developed and well-nourished.  HENT:  Head: Normocephalic and atraumatic.  Eyes: Pupils are equal, round, and reactive to light. Conjunctivae are normal.  Cardiovascular: Regular rhythm and normal heart sounds.  Respiratory: Effort normal. No respiratory distress.  GI: Soft.  Musculoskeletal:        General: Normal range of motion.     Cervical back: Normal range of motion.       Legs:  Neurological: He is alert.  Skin: Skin is warm and dry.  Psychiatric: His affect is blunt. His speech is delayed. He is slowed and withdrawn. Cognition and memory are normal. He expresses impulsivity. He exhibits a depressed mood. He expresses suicidal ideation. He expresses no suicidal plans.    Review of Systems  Constitutional: Negative.   HENT: Negative.   Eyes: Negative.   Respiratory: Negative.   Cardiovascular: Negative.   Gastrointestinal: Negative.   Musculoskeletal: Negative.   Skin: Negative.   Neurological: Negative.   Psychiatric/Behavioral: Positive for dysphoric mood, sleep disturbance and suicidal ideas. Negative for hallucinations.    Blood pressure (!) 148/77, pulse 79, temperature 98.9 F (37.2 C), temperature source Oral, resp. rate 17, height 5' 11"  (1.803 m), SpO2 97 %.Body mass index is 23.57 kg/m.  General Appearance: Casual  Eye Contact:  Fair  Speech:  Slow  Volume:  Decreased  Mood:  Depressed  Affect:  Congruent and Constricted  Thought Process:  Coherent  Orientation:  Full (Time, Place, and Person)  Thought Content:  Logical  Suicidal Thoughts:  Yes.  without intent/plan  Homicidal Thoughts:  No  Memory:  Immediate;   Fair Recent;   Fair Remote;   Fair  Judgement:  Impaired  Insight:  Shallow  Psychomotor Activity:  Decreased  Concentration:  Concentration: Poor  Recall:   [employer] of Knowledge:  Fair  Language:  Fair  Akathisia:  No  Handed:  Right  AIMS (if indicated):     Assets:  Communication Skills Desire for Improvement Resilience  ADL's:  Impaired  Cognition:  WNL  Sleep:  Number of Hours: 7.75    Treatment Plan Summary: Daily contact with patient to assess and evaluate symptoms and progress in treatment, Medication management and Plan 64 year old man with multiple severe symptoms of major depression.  Also major and complex medical problems primarily poorly controlled diabetes with amputations.  Good news with that however is that his kidney function appears to be intact.  Patient believes  that he cannot return home to his previous residence with his long-term partner.  This certainly could be true but it also could be an exaggeration or misunderstanding related to his depression.  We will have to follow-up on that.  I as far as his depression I am starting him on mirtazapine 15 mg at night.  He agrees to this.  Encourage group attendance.  Diabetes coordinator is helping [place] out with control of blood sugars.  Observation Level/Precautions:  15 minute checks  Laboratory:  HbAIC  Psychotherapy:    Medications:    Consultations:    Discharge Concerns:    Estimated LOS:  Other:     Physician Treatment Plan for Primary Diagnosis: MDD (major depressive disorder) Long Term Goal(s): Improvement in symptoms so as ready for discharge  Short Term Goals: Ability to verbalize feelings will improve, Ability to disclose and discuss suicidal ideas and Ability to demonstrate self-control will improve  Physician Treatment Plan for Secondary Diagnosis: Principal Problem:   MDD (major depressive disorder) Active Problems:   Diabetes ([place])   HTN (hypertension)   Cocaine abuse ([place])   Below-knee amputation of left lower extremity ([place])   PAD (peripheral artery disease) ([place])  Long Term Goal(s): Improvement in symptoms so as ready for discharge  Short  Term Goals: Ability to identify and develop effective coping behaviors will improve, Ability to maintain clinical measurements within normal limits will improve and Compliance with prescribed medications will improve  I certify that inpatient services furnished can reasonably be expected to improve the patient's condition.    Alethia Berthold, MD 1/14/20212:54 PM

## 2019-11-06 LAB — GLUCOSE, RANDOM: Glucose, Bld: 469 mg/dL — ABNORMAL HIGH (ref 70–99)

## 2019-11-06 LAB — GLUCOSE, CAPILLARY
Glucose-Capillary: 282 mg/dL — ABNORMAL HIGH (ref 70–99)
Glucose-Capillary: 106 mg/dL — ABNORMAL HIGH (ref 70–99)
Glucose-Capillary: 273 mg/dL — ABNORMAL HIGH (ref 70–99)
Glucose-Capillary: 433 mg/dL — ABNORMAL HIGH (ref 70–99)
Glucose-Capillary: 451 mg/dL — ABNORMAL HIGH (ref 70–99)
Glucose-Capillary: 384 mg/dL — ABNORMAL HIGH (ref 70–99)

## 2019-11-06 NOTE — Plan of Care (Signed)
Pt rates depression and anxiety both 2/10. Pt denies SI, HI and AVH. Pt was educated on care plan and verbalizes understanding. Collier Bullock RN Problem: Education: Goal: Knowledge of  General Education information/materials will improve Outcome: Progressing   Problem: Coping: Goal: Ability to verbalize frustrations and anger appropriately will improve Outcome: Progressing Goal: Ability to demonstrate self-control will improve Outcome: Progressing   Problem: Safety: Goal: Periods of time without injury will increase Outcome: Progressing   Problem: Education: Goal: Knowledge of the prescribed therapeutic regimen will improve Outcome: Progressing   Problem: Medication: Goal: Compliance with prescribed medication regimen will improve Outcome: Progressing

## 2019-11-06 NOTE — Progress Notes (Signed)
[facility] MD Progress Note  11/06/2019 5:05 PM Richard [name]  MRN:  [id number]   Subjective: Follow-up was patient diagnosed with major depressive disorder.  Patient reports that he is doing okay today but he has had a down day.  He states that he was recently told by the girlfriend that he was not allowed back at the house and he said that that was a pretty significant blow to him.  He states that she is also told them that she would not allow him to come back to pick up.  Patient denies that this made him feel suicidal but does report that it did make him feel a little more depressed.  He denies having any suicidal or homicidal ideations and denies any hallucinations.  Patient reports that he is interested in possibly going to a shelter for the possibly going to the [facility] outpatient physical therapy because they have a long before.  Patient understands that this may not be a possibility have to be discussed with social work Biochemist, clinical and that a facility has to accept him for those kind of reasons.  Principal Problem: MDD (major depressive disorder) Diagnosis: Principal Problem:   MDD (major depressive disorder) Active Problems:   Diabetes ([place])   HTN (hypertension)   Cocaine abuse ([place])   Below-knee amputation of left lower extremity (HCC)   PAD (peripheral artery disease) ([place])  Total Time spent with patient: 20 minutes  Past Psychiatric History: Patient has no previous psychiatric hospitalizations.  He was seen by a psychiatrist in consultation at least once in the emergency room not long ago but was judged to not need inpatient treatment.  He was recommended for outpatient follow-up but did not follow-up with any mental health treatment.  Has not stayed on antidepressants for a length of time to judge their therapeutic effectiveness.  He says he has had 2 suicide attempts by overdose within the past 1 year.  He denies history of mania or psychosis.  He has struggled with cocaine abuse for years on and  off but has never engaged in any treatment.  Denies alcohol abuse  Past Medical History:  Past Medical History:  Diagnosis Date  . Atrial fibrillation ([place])    per patient  . Cancer ([place])   . CHF (congestive heart failure) ([place])   . Chronic back pain   . Chronic leg pain   . Coronary artery disease   . Diabetes mellitus without complication ([place])   . Hypertension   . Neuropathy     Past Surgical History:  Procedure Laterality Date  . AMPUTATION Left 03/13/2019   Procedure: AMPUTATION BELOW KNEE;  Surgeon: Algernon Huxley, MD;  Location: [facility] ORS;  Service: Vascular;  Laterality: Left;  . CORONARY ANGIOPLASTY WITH STENT PLACEMENT    . EMBOLECTOMY Left 03/12/2019   Procedure: EMBOLECTOMY SFA POPLITEAL;  Surgeon: Algernon Huxley, MD;  Location: [facility] ORS;  Service: Vascular;  Laterality: Left;  . ENDARTERECTOMY FEMORAL Left 03/12/2019   Procedure: ENDARTERECTOMY FEMORAL;  Surgeon: Algernon Huxley, MD;  Location: [facility] ORS;  Service: Vascular;  Laterality: Left;  . ESOPHAGOGASTRODUODENOSCOPY N/A 09/11/2017   Procedure: ESOPHAGOGASTRODUODENOSCOPY (EGD);  Surgeon: Virgel Manifold, MD;  Location: [facility] ENDOSCOPY;  Service: Endoscopy;  Laterality: N/A;  . LOWER EXTREMITY ANGIOGRAPHY Left 11/17/2018   Procedure: Lower Extremity Angiography, with possible intervention;  Surgeon: Algernon Huxley, MD;  Location: [facility];  Service: Cardiovascular;  Laterality: Left;  . LOWER EXTREMITY ANGIOGRAPHY Left 03/04/2019   Procedure: Lower  Extremity Angiography;  Surgeon: Algernon Huxley, MD;  Location: [facility];  Service: Cardiovascular;  Laterality: Left;  . LOWER EXTREMITY ANGIOGRAPHY Left 07/01/2019   Procedure: LOWER EXTREMITY ANGIOGRAPHY;  Surgeon: Algernon Huxley, MD;  Location: [facility];  Service: Cardiovascular;  Laterality: Left;  . pancreas removed     Family History:  Family History  Problem Relation Age of Onset  . CAD Mother   . Diabetes Mellitus II Mother   . Diabetes  Mellitus II Father    Family Psychiatric  History: None reported Social History:  Social History   Substance and Sexual Activity  Alcohol Use No  . Alcohol/week: 0.0 standard drinks     Social History   Substance and Sexual Activity  Drug Use Yes  . Types: Cocaine, Benzodiazepines   Comment: Has used in the past-heroin. Cocaine used over a year ago per pt.    Social History   Socioeconomic History  . Marital status: Single    Spouse name: Not on file  . Number of children: Not on file  . Years of education: Not on file  . Highest education level: Not on file  Occupational History  . Not on file  Tobacco Use  . Smoking status: Current Every Day Smoker    Packs/day: 0.50    Years: 50.00    Pack years: 25.00  . Smokeless tobacco: Never Used  Substance and Sexual Activity  . Alcohol use: No    Alcohol/week: 0.0 standard drinks  . Drug use: Yes    Types: Cocaine, Benzodiazepines    Comment: Has used in the past-heroin. Cocaine used over a year ago per pt.  . Sexual activity: Not Currently  Other Topics Concern  . Not on file  Social History Narrative  . Not on file   Social Determinants of Health   Financial Resource Strain:   . Difficulty of Paying Living Expenses: Not on file  Food Insecurity:   . Worried About Charity fundraiser in the Last Year: Not on file  . Ran Out of Food in the Last Year: Not on file  Transportation Needs:   . Lack of Transportation (Medical): Not on file  . Lack of Transportation (Non-Medical): Not on file  Physical Activity:   . Days of Exercise per Week: Not on file  . Minutes of Exercise per Session: Not on file  Stress:   . Feeling of Stress : Not on file  Social Connections:   . Frequency of Communication with Friends and Family: Not on file  . Frequency of Social Gatherings with Friends and Family: Not on file  . Attends Religious Services: Not on file  . Active Member of Clubs or Organizations: Not on file  . Attends English as a second language teacher Meetings: Not on file  . Marital Status: Not on file   Additional Social History:                         Sleep: Good  Appetite:  Good  Current Medications: Current Facility-Administered Medications  Medication Dose Route Frequency Provider Last Rate Last Admin  . alum & mag hydroxide-simeth (MAALOX/MYLANTA) 200-200-20 MG/5ML suspension 30 mL  30 mL Oral Q4H PRN Cristofano, Dorene Ar, MD      . apixaban (ELIQUIS) tablet 5 mg  5 mg Oral Q12H Cristofano, Dorene Ar, MD   5 mg at 11/06/19 0825  . carvedilol (COREG) tablet 3.125 mg  3.125 mg  Oral BID WC Cristofano, Paul A, MD   3.125 mg at 11/06/19 1640  . escitalopram (LEXAPRO) tablet 5 mg  5 mg Oral Daily Cristofano, Dorene Ar, MD   5 mg at 11/06/19 0823  . famotidine (PEPCID) tablet 20 mg  20 mg Oral BID Cristofano, Dorene Ar, MD   20 mg at 11/06/19 1640  . feeding supplement (ENSURE ENLIVE) (ENSURE ENLIVE) liquid 237 mL  237 mL Oral BID BM Cristofano, Dorene Ar, MD   237 mL at 11/06/19 1449  . folic acid (FOLVITE) tablet 1 mg  1 mg Oral Daily Cristofano, Dorene Ar, MD   1 mg at 11/06/19 0824  . gabapentin (NEURONTIN) capsule 300 mg  300 mg Oral TID Clapacs, John T, MD   300 mg at 11/06/19 1640  . hydrOXYzine (ATARAX/VISTARIL) tablet 25 mg  25 mg Oral TID PRN Cristofano, Paul A, MD      . insulin aspart (novoLOG) injection 0-9 Units  0-9 Units Subcutaneous TID AC & HS Cristofano, Dorene Ar, MD   5 Units at 11/06/19 1638  . insulin aspart (novoLOG) injection 3 Units  3 Units Subcutaneous TID WC Clapacs, Madie Reno, MD   3 Units at 11/06/19 1638  . insulin glargine (LANTUS) injection 10 Units  10 Units Subcutaneous QHS Clapacs, Madie Reno, MD   10 Units at 11/05/19 2140  . magnesium hydroxide (MILK OF MAGNESIA) suspension 30 mL  30 mL Oral Daily PRN Cristofano, Paul A, MD      . mirtazapine (REMERON) tablet 15 mg  15 mg Oral QHS Clapacs, Madie Reno, MD   15 mg at 11/05/19 2100  . multivitamin with minerals tablet 1 tablet  1 tablet Oral Daily  Cristofano, Dorene Ar, MD   1 tablet at 11/06/19 0824  . rosuvastatin (CRESTOR) tablet 40 mg  40 mg Oral q1800 Cristofano, Dorene Ar, MD   40 mg at 11/05/19 1715  . thiamine tablet 100 mg  100 mg Oral Daily Cristofano, Dorene Ar, MD   100 mg at 11/06/19 [id number]    Lab Results:  Results for orders placed or performed during the hospital encounter of 11/04/19 (from the past 48 hour(s))  Glucose, capillary     Status: Abnormal   Collection Time: 11/04/19  9:32 PM  Result Value Ref Range   Glucose-Capillary 288 (H) 70 - 99 mg/dL  Glucose, capillary     Status: Abnormal   Collection Time: 11/05/19  6:52 AM  Result Value Ref Range   Glucose-Capillary 228 (H) 70 - 99 mg/dL  Glucose, capillary     Status: Abnormal   Collection Time: 11/05/19 11:28 AM  Result Value Ref Range   Glucose-Capillary 193 (H) 70 - 99 mg/dL  Glucose, capillary     Status: Abnormal   Collection Time: 11/05/19  4:28 PM  Result Value Ref Range   Glucose-Capillary 299 (H) 70 - 99 mg/dL   Comment 1 Notify RN   Glucose, capillary     Status: Abnormal   Collection Time: 11/05/19  8:36 PM  Result Value Ref Range   Glucose-Capillary 368 (H) 70 - 99 mg/dL  Glucose, capillary     Status: Abnormal   Collection Time: 11/06/19  7:05 AM  Result Value Ref Range   Glucose-Capillary 282 (H) 70 - 99 mg/dL  Glucose, capillary     Status: Abnormal   Collection Time: 11/06/19 11:31 AM  Result Value Ref Range   Glucose-Capillary 106 (H) 70 - 99 mg/dL   Comment 1 Notify RN  Glucose, capillary     Status: Abnormal   Collection Time: 11/06/19  4:23 PM  Result Value Ref Range   Glucose-Capillary 273 (H) 70 - 99 mg/dL   Comment 1 Notify RN     Blood Alcohol level:  Lab Results  Component Value Date   ETH <10 10/29/2019   ETH <10 0000000    Metabolic Disorder Labs: Lab Results  Component Value Date   HGBA1C 14.9 (H) 10/30/2019   MPG 380.93 10/30/2019   MPG >398 02/28/2019   No results found for: PROLACTIN Lab Results   Component Value Date   CHOL 186 08/10/2015   TRIG 91 10/29/2019   HDL 25 (L) 08/10/2015   CHOLHDL 7.4 08/10/2015   VLDL 29 08/10/2015   LDLCALC 132 (H) 08/10/2015    Physical Findings: AIMS:  , ,  ,  ,    CIWA:    COWS:     Musculoskeletal: Strength & Muscle Tone: within normal limits Gait & Station: Difficulty with gait due to BTK apmputation Patient leans: N/A  Psychiatric Specialty Exam: Physical Exam  Nursing note and vitals reviewed. Constitutional: He is oriented to person, place, and time. He appears well-developed and well-nourished.  Cardiovascular: Normal rate.  Respiratory: Effort normal.  Neurological: He is alert and oriented to person, place, and time.  Skin: Skin is warm.    Review of Systems  Constitutional: Negative.   HENT: Negative.   Eyes: Negative.   Respiratory: Negative.   Cardiovascular: Negative.   Gastrointestinal: Negative.   Genitourinary: Negative.   Musculoskeletal: Negative.   Skin: Negative.   Neurological: Negative.   Psychiatric/Behavioral: Positive for dysphoric mood.    Blood pressure (!) 169/91, pulse 83, temperature 98.9 F (37.2 C), temperature source Oral, resp. rate 18, height 5\' 11"  (1.803 m), SpO2 99 %.Body mass index is 23.57 kg/m.  General Appearance: Casual  Eye Contact:  Fair  Speech:  Clear and Coherent and Normal Rate  Volume:  Decreased  Mood:  Depressed  Affect:  Congruent  Thought Process:  Coherent and Descriptions of Associations: Intact  Orientation:  Full (Time, Place, and Person)  Thought Content:  WDL  Suicidal Thoughts:  No  Homicidal Thoughts:  No  Memory:  Immediate;   Fair Recent;   Fair Remote;   Fair  Judgement:  Fair  Insight:  Fair  Psychomotor Activity:  Normal  Concentration:  Concentration: Fair  Recall:  [employer] of Knowledge:  Fair  Language:  Fair  Akathisia:  No  Handed:  Right  AIMS (if indicated):     Assets:  Communication Skills Desire for Improvement Financial  Resources/Insurance Resilience  ADL's:  Intact  Cognition:  WNL  Sleep:  Number of Hours: 7.5   Assessment: Patient presents in the day room and has been eating his supper.  Patient is pleasant, calm, cooperative.  He does appear to be depressed and distraught a little bit about being told that he cannot return to his girlfriend's house.  He states that he is interested in possibly going to a shelter or to another facility.  He states he is not interested in trying to go live with his family and other states.  Patient continues to deny any suicidal homicidal ideations and denies any hallucinations.  We will continue all current medications at this time.  Patient's blood sugars have continued to decrease and stabilize some.  Blood sugars reported today were 282, 106 and 273.  Treatment Plan Summary: Daily contact with patient to  assess and evaluate symptoms and progress in treatment and Medication management  Continue Lexapro 5 mg p.o. daily for depression Continue Neurontin 300 mg p.o. 3 times daily for neuropathic pain Continue Vistaril 25 mg p.o. 3 times daily as needed for anxiety Continue Remeron 15 mg p.o. nightly for depression and insomnia Encourage group therapy participation Continue every 15 minute safety checks  Lewis Shock, FNP 11/06/2019, 5:05 PM

## 2019-11-06 NOTE — Plan of Care (Signed)
  Problem: Safety: Goal: Periods of time without injury will increase Outcome: Progressing  Patient denies SI/HI/AVH.

## 2019-11-06 NOTE — Progress Notes (Signed)
Recreation Therapy Notes  INPATIENT RECREATION THERAPY ASSESSMENT  Patient Details Name: Richard [name] MRN: [id number] DOB: 1956-01-21 Today's Date: 11/06/2019       Information Obtained From: Patient  Able to Participate in Assessment/Interview: Yes  Patient Presentation: Responsive  Reason for Admission (Per Patient): Active Symptoms, Substance Abuse  Patient Stressors:    Coping Skills:   TV, Sports  Leisure Interests (2+):  [place] games, Sports - [employer], Sports - Football  Frequency of Recreation/Participation:    Futures trader Resources:     [employer]:     Current Use:    If no, Barriers?:    Expressed Interest in [place] of Residence:  Insurance underwriter  Patient Main Form of Transportation:    Patient Strengths:  Understanding  Patient Identified Areas of Improvement:  Place to live  Patient Goal for Hospitalization:  Get myself together  Current SI (including self-harm):  No  Current HI:  No  Current AVH: No  Staff Intervention Plan: Group Attendance, Collaborate with Interdisciplinary Treatment Team  Consent to Intern Participation: N/A  [name] 11/06/2019, 3:28 PM

## 2019-11-06 NOTE — BHH Counselor (Signed)
CSW spoke with Andreas Newport, girlfriend, [phone].  She reports that "he has been depressed".  She reports that the patient was not contributing to having a "roof over his head for 2 months".  She reports that she has a open CPS report "and they can stop by at any time and he can't be here doing drugs or whatever".  She reports that the "relationship is OVER".  She reports "I can't keep finding him unresponsive on the floor, this ain't the first time he done that".  She reports that she and their daughter found the patient in the floor prior admission.   She reports that the patient needs a higher level of care.    She reports that she has started the eviction process and court is scheduled for February 17.  She reports that the patient was aware that the eviction process had been started.  Assunta Curtis, MSW, LCSW 11/06/2019 10:27 AM

## 2019-11-06 NOTE — Progress Notes (Signed)
Recreation Therapy Notes   Date: 11/06/2019  Time: 9:30 am  Location: Craft room  Behavioral response: Appropriate  Intervention Topic: Strengths  Discussion/Intervention:  Group content today was focused on strengths. The group identified some of the strengths they have. Individuals stated reason why they do not use their strengths. Patients expressed what strengths others see in them. The group identified important reason to use their strengths. The group participated in the intervention "Picking strengths", where they had a chance to identify some of their strengths. Clinical Observations/Feedback:  Patient came to group late due to unknown reasons. Participant identified understanding, loving, kindness, and self-control as his strengths. He stated that strengths can be used for relationships and personal fulfillment. Individual participated in the intervention during group and was social with peers and staff.  Franki Stemen LRT/CTRS         Jadiel Schmieder 11/06/2019 11:43 AM

## 2019-11-06 NOTE — Progress Notes (Signed)
Patient alert and oriented x 4, affect is flat he brightens upon approach, he was noted irritable when writer advised him about selecting better food options during snack time. Patient's thoughts are organized and coherent,  Patient's blood sugar was checked it was 368 he was medicated with sliding scale as ordered and given Lantus  long acting as well. Patient is receptive to care, he denies SI/HI/AVH, no distress noted, 15 minutes safety checks maintained, will continue to monitor.

## 2019-11-06 NOTE — BHH Counselor (Signed)
CSW spoke with patient about his code. Pt declined to show CSW his arm so that I could read the code to him, instead he said that the code was written down for him on his table.  CSW informed that patient is in the process of being evicted and has court coming up Feb17 for eviction.  Patient was adamant that he was going to return to his home with his long term partner.   CSW asked where the patient would stay since he was being evicted and pointed out that daughter in [place] was 72 and in college.  Pt reports that he was referring to his 62 year old daughter.  Pt declined to sign a consent for CSW to verify if patient can stay with daughter in [place] or mother in [place].  Assunta Curtis, MSW, LCSW 11/06/2019 11:55 AM

## 2019-11-06 NOTE — BHH Suicide Risk Assessment (Signed)
[facility] INPATIENT:  Family/Significant Other Suicide Prevention Education  Suicide Prevention Education:  Education Completed; Andreas Newport, girlfriend, [phone] has been identified by the patient as the family member/significant other with whom the patient will be residing, and identified as the person(s) who will aid the patient in the event of a mental health crisis (suicidal ideations/suicide attempt).  With written consent from the patient, the family member/significant other has been provided the following suicide prevention education, prior to the and/or following the discharge of the patient.  The suicide prevention education provided includes the following:  Suicide risk factors  Suicide prevention and interventions  National Suicide Hotline telephone number  [facility] assessment telephone number  [facility] Emergency Assistance [place] and/or Residential Mobile Crisis Unit telephone number  Request made of family/significant other to:  Remove weapons (e.g., guns, rifles, knives), all items previously/currently identified as safety concern.    Remove drugs/medications (over-the-counter, prescriptions, illicit drugs), all items previously/currently identified as a safety concern.  The family member/significant other verbalizes understanding of the suicide prevention education information provided.  The family member/significant other agrees to remove the items of safety concern listed above.  Rozann Lesches 11/06/2019, 10:18 AM

## 2019-11-06 NOTE — Progress Notes (Signed)
Patient was in the dayroom toward lunch time. Calm and cooperative and denying thoughts of self had. Had his lunch in the day room. Patient used the phone then became upset and sad  But would not discuss the issue. CBG 106 at lunch time, requiring no insulin. Received his scheduled 3 units. Patient ate his lunch and has not complained of anything. Support and encouragements provided. Safety monitored as recommended.

## 2019-11-06 NOTE — BHH Group Notes (Signed)
Feelings Around Relapse 11/06/2019 1PM  Type of Therapy and Topic:  Group Therapy:  Feelings around Relapse and Recovery  Participation Level:  Did Not Attend   Description of Group:    Patients in this group will discuss emotions they experience before and after a relapse. They will process how experiencing these feelings, or avoidance of experiencing them, relates to having a relapse. Facilitator will guide patients to explore emotions they have related to recovery. Patients will be encouraged to process which emotions are more powerful. They will be guided to discuss the emotional reaction significant others in their lives may have to patients' relapse or recovery. Patients will be assisted in exploring ways to respond to the emotions of others without this contributing to a relapse.  Therapeutic Goals: 1. Patient will identify two or more emotions that lead to a relapse for them 2. Patient will identify two emotions that result when they relapse 3. Patient will identify two emotions related to recovery 4. Patient will demonstrate ability to communicate their needs through discussion and/or role plays   Summary of Patient Progress:     Therapeutic Modalities:   Cognitive Behavioral Therapy Solution-Focused Therapy Assertiveness Training Relapse Prevention Therapy   Yvette Rack, LCSW 11/06/2019 2:11 PM

## 2019-11-07 LAB — CBC
HCT: 31.8 % — ABNORMAL LOW (ref 39.0–52.0)
Hemoglobin: 9.9 g/dL — ABNORMAL LOW (ref 13.0–17.0)
MCH: 28.3 pg (ref 26.0–34.0)
MCHC: 31.1 g/dL (ref 30.0–36.0)
MCV: 90.9 fL (ref 80.0–100.0)
Platelets: 548 10*3/uL — ABNORMAL HIGH (ref 150–400)
RBC: 3.5 MIL/uL — ABNORMAL LOW (ref 4.22–5.81)
RDW: 14 % (ref 11.5–15.5)
WBC: 9.4 10*3/uL (ref 4.0–10.5)
nRBC: 0 % (ref 0.0–0.2)

## 2019-11-07 LAB — GLUCOSE, CAPILLARY
Glucose-Capillary: 289 mg/dL — ABNORMAL HIGH (ref 70–99)
Glucose-Capillary: 154 mg/dL — ABNORMAL HIGH (ref 70–99)
Glucose-Capillary: 194 mg/dL — ABNORMAL HIGH (ref 70–99)
Glucose-Capillary: 337 mg/dL — ABNORMAL HIGH (ref 70–99)

## 2019-11-07 LAB — CREATININE, SERUM
Creatinine, Ser: 0.89 mg/dL (ref 0.61–1.24)
GFR calc Af Amer: >60 mL/min (ref >60)
GFR calc non Af Amer: >60 mL/min (ref >60)

## 2019-11-07 MED ORDER — MIRTAZAPINE 15 MG PO TABS
30.0000 mg | ORAL_TABLET | Freq: Every day | ORAL | Status: DC
  Administered 2019-11-07 – 2019-11-09 (×3): 30 mg via ORAL
  Filled 2019-11-07 (×4): qty 2

## 2019-11-07 NOTE — Progress Notes (Signed)
D: Suicidal, Depression    A: Patient stated slept good last night .Stated appetite is good and energy level  Is normal. Stated concentration is good . Stated on Depression scale , hopeless and anxiety .( low 0-10 high) Denies suicidal  homicidal ideations .  No auditory hallucinations  No pain concerns . Appropriate ADL'S. Interacting with peers and staff.   Encourage patient participation with unit programming . Instruction  Given on  Medication , verbalize understanding.Patient  knowledgeable    of information  receive regarding [place] [facility]  and unit programing .  Patient able to vent frustration  appropriately  and demonstrate self controled,  able to come to staff for  any concerns . Voice of no safety concerns . Able to state medication  received and knowledgeable of their  usage .   R: Voice no other concerns. Staff continue to monitor

## 2019-11-07 NOTE — Plan of Care (Signed)
Cooperative with treatment, he remains sad but he interacts well with peers and staff. Patient followed medication regime. He reports having a greater appetite and is constantly requesting food and snacks. Patient educated on food choices due to Diabetes management. He is currently in bed resting quietly at this time.

## 2019-11-07 NOTE — BHH Group Notes (Signed)
[facility] LCSW Group Therapy Note  Date/Time : 11/07/2019 @ 1:30pm  Type of Therapy/Topic:  Group Therapy:  Feelings about Diagnosis  Participation Level:  Did Not Attend   Mood: Did not attend    Description of Group:    This group will allow patients to explore their thoughts and feelings about diagnoses they have received. Patients will be guided to explore their level of understanding and acceptance of these diagnoses. Facilitator will encourage patients to process their thoughts and feelings about the reactions of others to their diagnosis, and will guide patients in identifying ways to discuss their diagnosis with significant others in their lives. This group will be process-oriented, with patients participating in exploration of their own experiences as well as giving and receiving support and challenge from other group members.   Therapeutic Goals: 1. Patient will demonstrate understanding of diagnosis as evidence by identifying two or more symptoms of the disorder:  2. Patient will be able to express two feelings regarding the diagnosis 3. Patient will demonstrate ability to communicate their needs through discussion and/or role plays  Summary of Patient Progress:    Patient did not attend group therapy today.     Therapeutic Modalities:   Cognitive Behavioral Therapy Brief Therapy Feelings Identification   Ardelle Anton, LCSW

## 2019-11-07 NOTE — Plan of Care (Signed)
Patient  knowledgeable    of information  receive regarding [facility]  and unit programing .  Patient able to vent frustration  appropriately  and demonstrate self controled,  able to come to staff for  any concerns . Voice of no safety concerns . Able to state medication  received and knowledgeable of their  usage .    Problem: Education: Goal: Knowledge of the prescribed therapeutic regimen will improve Outcome: Progressing   Problem: Medication: Goal: Compliance with prescribed medication regimen will improve Outcome: Progressing   Problem: Safety: Goal: Periods of time without injury will increase Outcome: Progressing   Problem: Coping: Goal: Ability to verbalize frustrations and anger appropriately will improve Outcome: Progressing Goal: Ability to demonstrate self-control will improve Outcome: Progressing   Problem: Education: Goal: Knowledge of [place] [facility] Education information/materials will improve Outcome: Progressing

## 2019-11-07 NOTE — Plan of Care (Signed)
Richard [name] remains sad and depressed, he was seen in the dayroom interacting with peers. He was medication compliant. Blood sugar taken and it was 433 before bed. He is non compliant with diet. MD called, lab called for verification. Blood sugar rechecked by RN before patient went to bed and it was less than 400. He is currently in bed resting at this time.

## 2019-11-07 NOTE — Progress Notes (Signed)
[facility] MD Progress Note  11/07/2019 2:00 PM Richard [name]  MRN:  [id number] Subjective: Patient seen and chart reviewed.  Patient with depression social circumstances problems and multiple medical problems.  Patient tells me his mood is okay but he still looks down and flat.  He denies any suicidal thoughts.  Social work has been working with him on trying to find some kind of discharge plan and he has been resistant to the idea of staying with relatives in another state.  His girlfriend apparently does not want him back and this seems unlikely to change.  Patient complains that he cannot eat any of the food because he has no teeth and requests a change to a soft diet.  Blood sugars mostly under okay control but has had a couple of elevated sugars at night. Principal Problem: MDD (major depressive disorder) Diagnosis: Principal Problem:   MDD (major depressive disorder) Active Problems:   Diabetes ([place])   HTN (hypertension)   Cocaine abuse ([place])   Below-knee amputation of left lower extremity (HCC)   PAD (peripheral artery disease) ([place])  Total Time spent with patient: 30 minutes  Past Psychiatric History: Past history of substance abuse  Past Medical History:  Past Medical History:  Diagnosis Date  . Atrial fibrillation ([place])    per patient  . Cancer ([place])   . CHF (congestive heart failure) ([place])   . Chronic back pain   . Chronic leg pain   . Coronary artery disease   . Diabetes mellitus without complication ([place])   . Hypertension   . Neuropathy     Past Surgical History:  Procedure Laterality Date  . AMPUTATION Left 03/13/2019   Procedure: AMPUTATION BELOW KNEE;  Surgeon: Algernon Huxley, MD;  Location: [facility] ORS;  Service: Vascular;  Laterality: Left;  . CORONARY ANGIOPLASTY WITH STENT PLACEMENT    . EMBOLECTOMY Left 03/12/2019   Procedure: EMBOLECTOMY SFA POPLITEAL;  Surgeon: Algernon Huxley, MD;  Location: [facility] ORS;  Service: Vascular;  Laterality: Left;  . ENDARTERECTOMY FEMORAL Left  03/12/2019   Procedure: ENDARTERECTOMY FEMORAL;  Surgeon: Algernon Huxley, MD;  Location: [facility] ORS;  Service: Vascular;  Laterality: Left;  . ESOPHAGOGASTRODUODENOSCOPY N/A 09/11/2017   Procedure: ESOPHAGOGASTRODUODENOSCOPY (EGD);  Surgeon: Virgel Manifold, MD;  Location: [facility] ENDOSCOPY;  Service: Endoscopy;  Laterality: N/A;  . LOWER EXTREMITY ANGIOGRAPHY Left 11/17/2018   Procedure: Lower Extremity Angiography, with possible intervention;  Surgeon: Algernon Huxley, MD;  Location: [facility] CV LAB;  Service: Cardiovascular;  Laterality: Left;  . LOWER EXTREMITY ANGIOGRAPHY Left 03/04/2019   Procedure: Lower Extremity Angiography;  Surgeon: Algernon Huxley, MD;  Location: [facility] LAB;  Service: Cardiovascular;  Laterality: Left;  . LOWER EXTREMITY ANGIOGRAPHY Left 07/01/2019   Procedure: LOWER EXTREMITY ANGIOGRAPHY;  Surgeon: Algernon Huxley, MD;  Location: [facility] CV LAB;  Service: Cardiovascular;  Laterality: Left;  . pancreas removed     Family History:  Family History  Problem Relation Age of Onset  . CAD Mother   . Diabetes Mellitus II Mother   . Diabetes Mellitus II Father    Family Psychiatric  History: See previous Social History:  Social History   Substance and Sexual Activity  Alcohol Use No  . Alcohol/week: 0.0 standard drinks     Social History   Substance and Sexual Activity  Drug Use Yes  . Types: Cocaine, Benzodiazepines   Comment: Has used in the past-heroin. Cocaine used over a year ago per pt.  Social History   Socioeconomic History  . Marital status: Single    Spouse name: Not on file  . Number of children: Not on file  . Years of education: Not on file  . Highest education level: Not on file  Occupational History  . Not on file  Tobacco Use  . Smoking status: Current Every Day Smoker    Packs/day: 0.50    Years: 50.00    Pack years: 25.00  . Smokeless tobacco: Never Used  Substance and Sexual Activity  . Alcohol use: No    Alcohol/week:  0.0 standard drinks  . Drug use: Yes    Types: Cocaine, Benzodiazepines    Comment: Has used in the past-heroin. Cocaine used over a year ago per pt.  . Sexual activity: Not Currently  Other Topics Concern  . Not on file  Social History Narrative  . Not on file   Social Determinants of Health   Financial Resource Strain:   . Difficulty of Paying Living Expenses: Not on file  Food Insecurity:   . Worried About Charity fundraiser in the Last Year: Not on file  . Ran Out of Food in the Last Year: Not on file  Transportation Needs:   . Lack of Transportation (Medical): Not on file  . Lack of Transportation (Non-Medical): Not on file  Physical Activity:   . Days of Exercise per Week: Not on file  . Minutes of Exercise per Session: Not on file  Stress:   . Feeling of Stress : Not on file  Social Connections:   . Frequency of Communication with Friends and Family: Not on file  . Frequency of Social Gatherings with Friends and Family: Not on file  . Attends Religious Services: Not on file  . Active Member of Clubs or Organizations: Not on file  . Attends Archivist Meetings: Not on file  . Marital Status: Not on file   Additional Social History:                         Sleep: Poor  Appetite:  Poor  Current Medications: Current Facility-Administered Medications  Medication Dose Route Frequency Provider Last Rate Last Admin  . alum & mag hydroxide-simeth (MAALOX/MYLANTA) 200-200-20 MG/5ML suspension 30 mL  30 mL Oral Q4H PRN Cristofano, Dorene Ar, MD      . apixaban (ELIQUIS) tablet 5 mg  5 mg Oral Q12H Cristofano, Dorene Ar, MD   5 mg at 11/07/19 0750  . carvedilol (COREG) tablet 3.125 mg  3.125 mg Oral BID WC Cristofano, Dorene Ar, MD   3.125 mg at 11/07/19 0742  . escitalopram (LEXAPRO) tablet 5 mg  5 mg Oral Daily Cristofano, Dorene Ar, MD   5 mg at 11/07/19 0742  . famotidine (PEPCID) tablet 20 mg  20 mg Oral BID Cristofano, Dorene Ar, MD   20 mg at 11/07/19 0741  .  feeding supplement (ENSURE ENLIVE) (ENSURE ENLIVE) liquid 237 mL  237 mL Oral BID BM Cristofano, Dorene Ar, MD   237 mL at 11/06/19 1449  . folic acid (FOLVITE) tablet 1 mg  1 mg Oral Daily Cristofano, Dorene Ar, MD   1 mg at 11/07/19 0741  . gabapentin (NEURONTIN) capsule 300 mg  300 mg Oral TID Skylin Kennerson, Madie Reno, MD   300 mg at 11/07/19 1204  . hydrOXYzine (ATARAX/VISTARIL) tablet 25 mg  25 mg Oral TID PRN Cristofano, Dorene Ar, MD      .  insulin aspart (novoLOG) injection 0-9 Units  0-9 Units Subcutaneous TID AC & HS Cristofano, Dorene Ar, MD   2 Units at 11/07/19 1206  . insulin aspart (novoLOG) injection 3 Units  3 Units Subcutaneous TID WC Maysin Carstens, Madie Reno, MD   3 Units at 11/07/19 1205  . insulin glargine (LANTUS) injection 10 Units  10 Units Subcutaneous QHS Leveda Kendrix, Madie Reno, MD   10 Units at 11/06/19 2148  . magnesium hydroxide (MILK OF MAGNESIA) suspension 30 mL  30 mL Oral Daily PRN Cristofano, Paul A, MD      . mirtazapine (REMERON) tablet 30 mg  30 mg Oral QHS Ana Liaw T, MD      . multivitamin with minerals tablet 1 tablet  1 tablet Oral Daily Cristofano, Dorene Ar, MD   1 tablet at 11/07/19 0741  . rosuvastatin (CRESTOR) tablet 40 mg  40 mg Oral q1800 Cristofano, Dorene Ar, MD   40 mg at 11/06/19 2150  . thiamine tablet 100 mg  100 mg Oral Daily Cristofano, Dorene Ar, MD   100 mg at 11/07/19 [id number]    Lab Results:  Results for orders placed or performed during the hospital encounter of 11/04/19 (from the past 48 hour(s))  Glucose, capillary     Status: Abnormal   Collection Time: 11/05/19  4:28 PM  Result Value Ref Range   Glucose-Capillary 299 (H) 70 - 99 mg/dL   Comment 1 Notify RN   Glucose, capillary     Status: Abnormal   Collection Time: 11/05/19  8:36 PM  Result Value Ref Range   Glucose-Capillary 368 (H) 70 - 99 mg/dL  Glucose, capillary     Status: Abnormal   Collection Time: 11/06/19  7:05 AM  Result Value Ref Range   Glucose-Capillary 282 (H) 70 - 99 mg/dL  Glucose, capillary      Status: Abnormal   Collection Time: 11/06/19 11:31 AM  Result Value Ref Range   Glucose-Capillary 106 (H) 70 - 99 mg/dL   Comment 1 Notify RN   Glucose, capillary     Status: Abnormal   Collection Time: 11/06/19  4:23 PM  Result Value Ref Range   Glucose-Capillary 273 (H) 70 - 99 mg/dL   Comment 1 Notify RN   Glucose, capillary     Status: Abnormal   Collection Time: 11/06/19  9:05 PM  Result Value Ref Range   Glucose-Capillary 433 (H) 70 - 99 mg/dL  Glucose, capillary     Status: Abnormal   Collection Time: 11/06/19  9:28 PM  Result Value Ref Range   Glucose-Capillary 451 (H) 70 - 99 mg/dL  Glucose, random     Status: Abnormal   Collection Time: 11/06/19  9:34 PM  Result Value Ref Range   Glucose, Bld 469 (H) 70 - 99 mg/dL    Comment: Performed at [facility], [address] [place], [place] [address]  Glucose, capillary     Status: Abnormal   Collection Time: 11/06/19 10:34 PM  Result Value Ref Range   Glucose-Capillary 384 (H) 70 - 99 mg/dL  Glucose, capillary     Status: Abnormal   Collection Time: 11/07/19  6:57 AM  Result Value Ref Range   Glucose-Capillary 289 (H) 70 - 99 mg/dL  Creatinine, serum     Status: None   Collection Time: 11/07/19  7:36 AM  Result Value Ref Range   Creatinine, Ser 0.89 0.61 - 1.24 mg/dL   GFR calc non Af Amer >60 >60 mL/min  GFR calc Af Amer >60 >60 mL/min    Comment: Performed at [facility], [address] [place], [place] [address]  CBC     Status: Abnormal   Collection Time: 11/07/19  7:36 AM  Result Value Ref Range   WBC 9.4 4.0 - 10.5 K/uL   RBC 3.50 (L) 4.22 - 5.81 MIL/uL   Hemoglobin 9.9 (L) 13.0 - 17.0 g/dL   HCT 31.8 (L) 39.0 - 52.0 %   MCV 90.9 80.0 - 100.0 fL   MCH 28.3 26.0 - 34.0 pg   MCHC 31.1 30.0 - 36.0 g/dL   RDW 14.0 11.5 - 15.5 %   Platelets 548 (H) 150 - 400 K/uL   nRBC 0.0 0.0 - 0.2 %    Comment: Performed at [facility], [address] [place],  [address]  Glucose,  capillary     Status: Abnormal   Collection Time: 11/07/19 11:31 AM  Result Value Ref Range   Glucose-Capillary 154 (H) 70 - 99 mg/dL   Comment 1 Notify RN     Blood Alcohol level:  Lab Results  Component Value Date   ETH <10 10/29/2019   ETH <10 0000000    Metabolic Disorder Labs: Lab Results  Component Value Date   HGBA1C 14.9 (H) 10/30/2019   MPG 380.93 10/30/2019   MPG >398 02/28/2019   No results found for: PROLACTIN Lab Results  Component Value Date   CHOL 186 08/10/2015   TRIG 91 10/29/2019   HDL 25 (L) 08/10/2015   CHOLHDL 7.4 08/10/2015   VLDL 29 08/10/2015   LDLCALC 132 (H) 08/10/2015    Physical Findings: AIMS:  , ,  ,  ,    CIWA:    COWS:     Musculoskeletal: Strength & Muscle Tone: within normal limits Gait & Station: unsteady Patient leans: N/A  Psychiatric Specialty Exam: Physical Exam  Nursing note and vitals reviewed. Constitutional: He appears well-developed and well-nourished.  HENT:  Head: Normocephalic and atraumatic.  Eyes: Pupils are equal, round, and reactive to light. Conjunctivae are normal.  Cardiovascular: Regular rhythm and normal heart sounds.  Respiratory: Effort normal. No respiratory distress.  GI: Soft.  Musculoskeletal:        General: Normal range of motion.     Cervical back: Normal range of motion.  Neurological: He is alert.  Skin: Skin is warm and dry.  Psychiatric: Judgment normal. His affect is blunt. His speech is delayed. He is slowed. Thought content is not paranoid. Cognition and memory are normal. He expresses no homicidal and no suicidal ideation.    Review of Systems  Constitutional: Negative.   HENT: Negative.   Eyes: Negative.   Respiratory: Negative.   Cardiovascular: Negative.   Gastrointestinal: Negative.   Musculoskeletal: Negative.   Skin: Negative.   Neurological: Negative.   Psychiatric/Behavioral: Positive for dysphoric mood and sleep disturbance. Negative for agitation, behavioral  problems, confusion and decreased concentration.    Blood pressure (!) 157/82, pulse 81, temperature 98.7 F (37.1 C), temperature source Oral, resp. rate 17, height 5\' 11"  (1.803 m), SpO2 100 %.Body mass index is 23.57 kg/m.  General Appearance: Casual  Eye Contact:  Fair  Speech:  Slow  Volume:  Decreased  Mood:  Dysphoric  Affect:  Congruent  Thought Process:  Goal Directed  Orientation:  Full (Time, Place, and Person)  Thought Content:  Logical  Suicidal Thoughts:  No  Homicidal Thoughts:  No  Memory:  Immediate;   Fair Recent;  Fair Remote;   Fair  Judgement:  Fair  Insight:  Fair  Psychomotor Activity:  Normal  Concentration:  Concentration: Fair  Recall:  [employer] of Knowledge:  Fair  Language:  Fair  Akathisia:  No  Handed:  Right  AIMS (if indicated):     Assets:  Desire for Improvement  ADL's:  Impaired  Cognition:  WNL  Sleep:  Number of Hours: 6.5     Treatment Plan Summary: Daily contact with patient to assess and evaluate symptoms and progress in treatment, Medication management and Plan 64 year old man.  Multiple major social problems.  No place to live.  Sugars still not very well controlled.  We are working with diabetes coordinator on adjusting insulin.  I am going to increase his mirtazapine to 30 mg at night.  At his request I am changing his diet to soft.  Urged patient to attend groups try and be forward thinking and reasonable about planning for discharge.  Alethia Berthold, MD 11/07/2019, 2:00 PM

## 2019-11-08 LAB — GLUCOSE, CAPILLARY
Glucose-Capillary: 167 mg/dL — ABNORMAL HIGH (ref 70–99)
Glucose-Capillary: 298 mg/dL — ABNORMAL HIGH (ref 70–99)
Glucose-Capillary: 200 mg/dL — ABNORMAL HIGH (ref 70–99)
Glucose-Capillary: 263 mg/dL — ABNORMAL HIGH (ref 70–99)
Glucose-Capillary: 320 mg/dL — ABNORMAL HIGH (ref 70–99)

## 2019-11-08 MED ORDER — TRAZODONE HCL 50 MG PO TABS
50.0000 mg | ORAL_TABLET | Freq: Every day | ORAL | Status: DC
  Administered 2019-11-08 – 2019-11-12 (×5): 50 mg via ORAL
  Filled 2019-11-08 (×5): qty 1

## 2019-11-08 MED ORDER — CARVEDILOL 6.25 MG PO TABS
6.2500 mg | ORAL_TABLET | Freq: Two times a day (BID) | ORAL | Status: DC
  Administered 2019-11-08 – 2019-11-13 (×10): 6.25 mg via ORAL
  Filled 2019-11-08 (×11): qty 1

## 2019-11-08 NOTE — Plan of Care (Signed)
D- Patient alert and oriented. Patient presents in a pleasant mood on assessment stating that he couldn't sleep last night and he doesn't know why. Patient denies depression/anxiety to this writer, however, he reported a "2/10" for both on his self-inventory, so he reported to this writer that he is feeling "a little down, but not much". Patient also denies SI, HI, AVH, and pain at this time. Patient's goal for today is "getting out of here".  A- Scheduled medications administered to patient, per MD orders. Support and encouragement provided.  Routine safety checks conducted every 15 minutes.  Patient informed to notify staff with problems or concerns.  R- No adverse drug reactions noted. Patient contracts for safety at this time. Patient compliant with medications and treatment plan. Patient receptive, calm, and cooperative. Patient interacts well with others on the unit.  Patient remains safe at this time.  Problem: Education: Goal: Knowledge of [place] [facility] Education information/materials will improve Outcome: Progressing   Problem: Coping: Goal: Ability to verbalize frustrations and anger appropriately will improve Outcome: Progressing Goal: Ability to demonstrate self-control will improve Outcome: Progressing   Problem: Safety: Goal: Periods of time without injury will increase Outcome: Progressing   Problem: Education: Goal: Knowledge of the prescribed therapeutic regimen will improve Outcome: Progressing   Problem: Medication: Goal: Compliance with prescribed medication regimen will improve Outcome: Progressing

## 2019-11-08 NOTE — [facility] Group Notes (Signed)
LCSW Aftercare Discharge Planning Group Note  11/08/2019   Type of Group and Topic: Psychoeducational Group: Discharge Planning  Participation Level: Did Not Attend  Description of Group  Discharge planning group reviews patient's anticipated discharge plans and assists patients to anticipate and address any barriers to wellness/recovery in the community. Suicide prevention education is reviewed with patients in group.  Therapeutic Goals  1. Patients will state their anticipated discharge plan and mental health aftercare  2. Patients will identify potential barriers to wellness in the community setting  3. Patients will engage in problem solving, solution focused discussion of ways to anticipate and address barriers to wellness/recovery  Summary of Patient Progress  Plan for Discharge/Comments:  Transportation Means:  Supports:  Therapeutic Modalities:  Richard [name], [place]  11/08/2019 1:57 PM

## 2019-11-08 NOTE — Plan of Care (Signed)
Patient stayed in the dayroom with peers. Alert and oriented. Denied suicidal/homicidal thoughts. Reported that his mood is improving. Rated depression at 4/10. Patient ate a snack, had medications and went to bed. Currently sleeping and has no sign of discomfort. Safety monitored as expected.

## 2019-11-08 NOTE — [facility] Group Notes (Signed)
[facility] Group Notes:  (Nursing/MHT/Case Management/Adjunct)  Date:  11/08/2019  Time:  5:04 PM  Type of Therapy:  Psychoeducational Skills  Participation Level:  Did Not Attend   Adela Lank [facility] 11/08/2019, 5:04 PM

## 2019-11-08 NOTE — Progress Notes (Signed)
[facility] MD Progress Note  11/08/2019 1:19 PM Cassel Jarecki  MRN:  [id number] Subjective: Follow-up for this 64 year old man with depression and multiple medical problems.  Patient has been getting up out of bed going to meals but otherwise for the most part stays isolated.  He tells me that his mood feels a little bit better.  He denies suicidal thoughts.  He says that his main complaint now he is not having any place to live.  He also says he is still having trouble sleeping and would like adding some medicine to help with sleep.  Blood sugars are running in the 200s currently.  He complains that he is not getting enough food. Principal Problem: MDD (major depressive disorder) Diagnosis: Principal Problem:   MDD (major depressive disorder) Active Problems:   Diabetes ([place])   HTN (hypertension)   Cocaine abuse ([place])   Below-knee amputation of left lower extremity (HCC)   PAD (peripheral artery disease) ([place])  Total Time spent with patient: 30 minutes  Past Psychiatric History: Past history of substance abuse primarily also depression  Past Medical History:  Past Medical History:  Diagnosis Date  . Atrial fibrillation ([place])    per patient  . Cancer ([place])   . CHF (congestive heart failure) ([place])   . Chronic back pain   . Chronic leg pain   . Coronary artery disease   . Diabetes mellitus without complication ([place])   . Hypertension   . Neuropathy     Past Surgical History:  Procedure Laterality Date  . AMPUTATION Left 03/13/2019   Procedure: AMPUTATION BELOW KNEE;  Surgeon: Algernon Huxley, MD;  Location: [facility] ORS;  Service: Vascular;  Laterality: Left;  . CORONARY ANGIOPLASTY WITH STENT PLACEMENT    . EMBOLECTOMY Left 03/12/2019   Procedure: EMBOLECTOMY SFA POPLITEAL;  Surgeon: Algernon Huxley, MD;  Location: [facility] ORS;  Service: Vascular;  Laterality: Left;  . ENDARTERECTOMY FEMORAL Left 03/12/2019   Procedure: ENDARTERECTOMY FEMORAL;  Surgeon: Algernon Huxley, MD;  Location: [facility] ORS;  Service:  Vascular;  Laterality: Left;  . ESOPHAGOGASTRODUODENOSCOPY N/A 09/11/2017   Procedure: ESOPHAGOGASTRODUODENOSCOPY (EGD);  Surgeon: Virgel Manifold, MD;  Location: [facility] ENDOSCOPY;  Service: Endoscopy;  Laterality: N/A;  . LOWER EXTREMITY ANGIOGRAPHY Left 11/17/2018   Procedure: Lower Extremity Angiography, with possible intervention;  Surgeon: Algernon Huxley, MD;  Location: [place] CV LAB;  Service: Cardiovascular;  Laterality: Left;  . LOWER EXTREMITY ANGIOGRAPHY Left 03/04/2019   Procedure: Lower Extremity Angiography;  Surgeon: Algernon Huxley, MD;  Location: [place] [facility];  Service: Cardiovascular;  Laterality: Left;  . LOWER EXTREMITY ANGIOGRAPHY Left 07/01/2019   Procedure: LOWER EXTREMITY ANGIOGRAPHY;  Surgeon: Algernon Huxley, MD;  Location: [place] [facility];  Service: Cardiovascular;  Laterality: Left;  . pancreas removed     Family History:  Family History  Problem Relation Age of Onset  . CAD Mother   . Diabetes Mellitus II Mother   . Diabetes Mellitus II Father    Family Psychiatric  History: See previous Social History:  Social History   Substance and Sexual Activity  Alcohol Use No  . Alcohol/week: 0.0 standard drinks     Social History   Substance and Sexual Activity  Drug Use Yes  . Types: Cocaine, Benzodiazepines   Comment: Has used in the past-heroin. Cocaine used over a year ago per pt.    Social History   Socioeconomic History  . Marital status: Single    Spouse name: Not on  file  . Number of children: Not on file  . Years of education: Not on file  . Highest education level: Not on file  Occupational History  . Not on file  Tobacco Use  . Smoking status: Current Every Day Smoker    Packs/day: 0.50    Years: 50.00    Pack years: 25.00  . Smokeless tobacco: Never Used  Substance and Sexual Activity  . Alcohol use: No    Alcohol/week: 0.0 standard drinks  . Drug use: Yes    Types: Cocaine, Benzodiazepines    Comment: Has used in the  past-heroin. Cocaine used over a year ago per pt.  . Sexual activity: Not Currently  Other Topics Concern  . Not on file  Social History Narrative  . Not on file   Social Determinants of Health   Financial Resource Strain:   . Difficulty of Paying Living Expenses: Not on file  Food Insecurity:   . Worried About Charity fundraiser in the Last Year: Not on file  . Ran Out of Food in the Last Year: Not on file  Transportation Needs:   . Lack of Transportation (Medical): Not on file  . Lack of Transportation (Non-Medical): Not on file  Physical Activity:   . Days of Exercise per Week: Not on file  . Minutes of Exercise per Session: Not on file  Stress:   . Feeling of Stress : Not on file  Social Connections:   . Frequency of Communication with Friends and Family: Not on file  . Frequency of Social Gatherings with Friends and Family: Not on file  . Attends Religious Services: Not on file  . Active Member of Clubs or Organizations: Not on file  . Attends Archivist Meetings: Not on file  . Marital Status: Not on file   Additional Social History:                         Sleep: Fair  Appetite:  Fair  Current Medications: Current Facility-Administered Medications  Medication Dose Route Frequency Provider Last Rate Last Admin  . alum & mag hydroxide-simeth (MAALOX/MYLANTA) 200-200-20 MG/5ML suspension 30 mL  30 mL Oral Q4H PRN Cristofano, Dorene Ar, MD      . apixaban (ELIQUIS) tablet 5 mg  5 mg Oral Q12H Cristofano, Dorene Ar, MD   5 mg at 11/08/19 0858  . carvedilol (COREG) tablet 6.25 mg  6.25 mg Oral BID WC Arvis Miguez T, MD      . escitalopram (LEXAPRO) tablet 5 mg  5 mg Oral Daily Cristofano, Dorene Ar, MD   5 mg at 11/08/19 0858  . famotidine (PEPCID) tablet 20 mg  20 mg Oral BID Cristofano, Dorene Ar, MD   20 mg at 11/08/19 0858  . feeding supplement (ENSURE ENLIVE) (ENSURE ENLIVE) liquid 237 mL  237 mL Oral BID BM Cristofano, Paul A, MD   237 mL at 11/08/19 1100   . folic acid (FOLVITE) tablet 1 mg  1 mg Oral Daily Cristofano, Dorene Ar, MD   1 mg at 11/08/19 0858  . gabapentin (NEURONTIN) capsule 300 mg  300 mg Oral TID Kohlton Gilpatrick T, MD   300 mg at 11/08/19 1158  . hydrOXYzine (ATARAX/VISTARIL) tablet 25 mg  25 mg Oral TID PRN Cristofano, Paul A, MD      . insulin aspart (novoLOG) injection 0-9 Units  0-9 Units Subcutaneous TID AC & HS Cristofano, Dorene Ar, MD   2  Units at 11/08/19 1158  . insulin aspart (novoLOG) injection 3 Units  3 Units Subcutaneous TID WC Ari Bernabei, Madie Reno, MD   3 Units at 11/08/19 1157  . insulin glargine (LANTUS) injection 10 Units  10 Units Subcutaneous QHS Anikah Hogge, Madie Reno, MD   10 Units at 11/07/19 2211  . magnesium hydroxide (MILK OF MAGNESIA) suspension 30 mL  30 mL Oral Daily PRN Cristofano, Paul A, MD      . mirtazapine (REMERON) tablet 30 mg  30 mg Oral QHS Jonae Renshaw, Madie Reno, MD   30 mg at 11/07/19 2209  . multivitamin with minerals tablet 1 tablet  1 tablet Oral Daily Cristofano, Dorene Ar, MD   1 tablet at 11/08/19 0858  . rosuvastatin (CRESTOR) tablet 40 mg  40 mg Oral q1800 Cristofano, Dorene Ar, MD   40 mg at 11/07/19 1713  . thiamine tablet 100 mg  100 mg Oral Daily Cristofano, Dorene Ar, MD   100 mg at 11/08/19 0858  . traZODone (DESYREL) tablet 50 mg  50 mg Oral QHS Johari Bennetts, Madie Reno, MD        Lab Results:  Results for orders placed or performed during the hospital encounter of 11/04/19 (from the past 48 hour(s))  Glucose, capillary     Status: Abnormal   Collection Time: 11/06/19  4:23 PM  Result Value Ref Range   Glucose-Capillary 273 (H) 70 - 99 mg/dL   Comment 1 Notify RN   Glucose, capillary     Status: Abnormal   Collection Time: 11/06/19  9:05 PM  Result Value Ref Range   Glucose-Capillary 433 (H) 70 - 99 mg/dL  Glucose, capillary     Status: Abnormal   Collection Time: 11/06/19  9:28 PM  Result Value Ref Range   Glucose-Capillary 451 (H) 70 - 99 mg/dL  Glucose, random     Status: Abnormal   Collection Time:  11/06/19  9:34 PM  Result Value Ref Range   Glucose, Bld 469 (H) 70 - 99 mg/dL    Comment: Performed at [facility], [address] [place], [place] [address]  Glucose, capillary     Status: Abnormal   Collection Time: 11/06/19 10:34 PM  Result Value Ref Range   Glucose-Capillary 384 (H) 70 - 99 mg/dL  Glucose, capillary     Status: Abnormal   Collection Time: 11/07/19  6:57 AM  Result Value Ref Range   Glucose-Capillary 289 (H) 70 - 99 mg/dL  Creatinine, serum     Status: None   Collection Time: 11/07/19  7:36 AM  Result Value Ref Range   Creatinine, Ser 0.89 0.61 - 1.24 mg/dL   GFR calc non Af Amer >60 >60 mL/min   GFR calc Af Amer >60 >60 mL/min    Comment: Performed at [facility], [address] [place], [place] [address]  CBC     Status: Abnormal   Collection Time: 11/07/19  7:36 AM  Result Value Ref Range   WBC 9.4 4.0 - 10.5 K/uL   RBC 3.50 (L) 4.22 - 5.81 MIL/uL   Hemoglobin 9.9 (L) 13.0 - 17.0 g/dL   HCT 31.8 (L) 39.0 - 52.0 %   MCV 90.9 80.0 - 100.0 fL   MCH 28.3 26.0 - 34.0 pg   MCHC 31.1 30.0 - 36.0 g/dL   RDW 14.0 11.5 - 15.5 %   Platelets 548 (H) 150 - 400 K/uL   nRBC 0.0 0.0 - 0.2 %    Comment: Performed at [employer]  [facility], [address] [place], [place] [address]  Glucose, capillary     Status: Abnormal   Collection Time: 11/07/19 11:31 AM  Result Value Ref Range   Glucose-Capillary 154 (H) 70 - 99 mg/dL   Comment 1 Notify RN   Glucose, capillary     Status: Abnormal   Collection Time: 11/07/19  4:21 PM  Result Value Ref Range   Glucose-Capillary 194 (H) 70 - 99 mg/dL   Comment 1 Notify RN   Glucose, capillary     Status: Abnormal   Collection Time: 11/07/19  9:02 PM  Result Value Ref Range   Glucose-Capillary 337 (H) 70 - 99 mg/dL  Glucose, capillary     Status: Abnormal   Collection Time: 11/08/19  6:37 AM  Result Value Ref Range   Glucose-Capillary 167 (H) 70 - 99 mg/dL  Glucose, capillary     Status: Abnormal    Collection Time: 11/08/19  8:53 AM  Result Value Ref Range   Glucose-Capillary 298 (H) 70 - 99 mg/dL   Comment 1 Notify RN   Glucose, capillary     Status: Abnormal   Collection Time: 11/08/19 11:10 AM  Result Value Ref Range   Glucose-Capillary 200 (H) 70 - 99 mg/dL    Blood Alcohol level:  Lab Results  Component Value Date   ETH <10 10/29/2019   ETH <10 0000000    Metabolic Disorder Labs: Lab Results  Component Value Date   HGBA1C 14.9 (H) 10/30/2019   MPG 380.93 10/30/2019   MPG >398 02/28/2019   No results found for: PROLACTIN Lab Results  Component Value Date   CHOL 186 08/10/2015   TRIG 91 10/29/2019   HDL 25 (L) 08/10/2015   CHOLHDL 7.4 08/10/2015   VLDL 29 08/10/2015   LDLCALC 132 (H) 08/10/2015    Physical Findings: AIMS:  , ,  ,  ,    CIWA:    COWS:     Musculoskeletal: Strength & Muscle Tone: within normal limits Gait & Station: unsteady Patient leans: N/A  Psychiatric Specialty Exam: Physical Exam  Nursing note and vitals reviewed. Constitutional: He appears well-developed and well-nourished.  HENT:  Head: Normocephalic and atraumatic.  Eyes: Pupils are equal, round, and reactive to light. Conjunctivae are normal.  Cardiovascular: Regular rhythm and normal heart sounds.  Respiratory: Effort normal. No respiratory distress.  GI: Soft.  Musculoskeletal:        General: Normal range of motion.     Cervical back: Normal range of motion.  Neurological: He is alert.  Skin: Skin is warm and dry.  Psychiatric: He has a normal mood and affect. Judgment normal. His speech is delayed. He is slowed. Thought content is not paranoid. Cognition and memory are normal. He expresses no homicidal and no suicidal ideation.    Review of Systems  Constitutional: Negative.   HENT: Negative.   Eyes: Negative.   Respiratory: Negative.   Cardiovascular: Negative.   Gastrointestinal: Negative.   Musculoskeletal: Negative.   Skin: Negative.   Neurological:  Negative.   Psychiatric/Behavioral: Positive for dysphoric mood and sleep disturbance.    Blood pressure (!) 150/83, pulse 84, temperature 98.7 F (37.1 C), temperature source Oral, resp. rate 18, height 5\' 11"  (1.803 m), SpO2 100 %.Body mass index is 23.57 kg/m.  General Appearance: Casual  Eye Contact:  Fair  Speech:  Slow  Volume:  Decreased  Mood:  Euthymic  Affect:  Congruent  Thought Process:  Coherent  Orientation:  Full (Time, Place,  and Person)  Thought Content:  Logical  Suicidal Thoughts:  No  Homicidal Thoughts:  No  Memory:  Immediate;   Fair Recent;   Fair Remote;   Fair  Judgement:  Fair  Insight:  Fair  Psychomotor Activity:  Decreased  Concentration:  Concentration: Fair  Recall:  [employer] of Knowledge:  Fair  Language:  Fair  Akathisia:  No  Handed:  Right  AIMS (if indicated):     Assets:  Desire for Improvement Resilience  ADL's:  Impaired  Cognition:  WNL  Sleep:  Number of Hours: 7.75     Treatment Plan Summary: Daily contact with patient to assess and evaluate symptoms and progress in treatment, Medication management and Plan Supportive therapy.  Encourage patient to be more active.  Encouraged him to consider that he might go to live with his other relatives in [place].  He said he will make some phone calls.  I will add a little bit of trazodone at night to sleep.  No change today to antidepressant.  Continue current diet as his blood sugars are already running a little high.  Alethia Berthold, MD 11/08/2019, 1:19 PM

## 2019-11-09 LAB — GLUCOSE, CAPILLARY
Glucose-Capillary: 388 mg/dL — ABNORMAL HIGH (ref 70–99)
Glucose-Capillary: 353 mg/dL — ABNORMAL HIGH (ref 70–99)
Glucose-Capillary: 395 mg/dL — ABNORMAL HIGH (ref 70–99)
Glucose-Capillary: 268 mg/dL — ABNORMAL HIGH (ref 70–99)

## 2019-11-09 MED ORDER — INSULIN GLARGINE 100 UNIT/ML ~~LOC~~ SOLN
15.0000 [IU] | Freq: Every day | SUBCUTANEOUS | Status: DC
  Administered 2019-11-09: 21:00:00 15 [IU] via SUBCUTANEOUS
  Filled 2019-11-09 (×2): qty 0.15

## 2019-11-09 MED ORDER — TEMAZEPAM 15 MG PO CAPS
15.0000 mg | ORAL_CAPSULE | Freq: Every day | ORAL | Status: DC
  Administered 2019-11-09 – 2019-11-12 (×4): 15 mg via ORAL
  Filled 2019-11-09 (×4): qty 1

## 2019-11-09 MED ORDER — INSULIN ASPART 100 UNIT/ML ~~LOC~~ SOLN
6.0000 [IU] | Freq: Three times a day (TID) | SUBCUTANEOUS | Status: DC
  Administered 2019-11-10 (×2): 6 [IU] via SUBCUTANEOUS
  Filled 2019-11-09 (×2): qty 1

## 2019-11-09 NOTE — Progress Notes (Signed)
Recreation Therapy Notes  Date: 11/09/2019  Time: 9:30 am   Location: Craft room   Behavioral response: N/A   Intervention Topic: Problem Solving  Discussion/Intervention: Patient did not attend group.   Clinical Observations/Feedback:  Patient did not attend group.   Othello Dickenson LRT/CTRS        Jaydee Ingman 11/09/2019 11:05 AM

## 2019-11-09 NOTE — Plan of Care (Signed)
D- Patient alert and oriented. Patient presents in a depressed, but pleasant mood on assessment reporting that he slept fair last night and had no complaints or concerns to voice to this Probation officer. Patient continues to endorse depression and anxiety, rating them both a "10/10", stating that "what's going on", is making him feel this way. Patient stated that he was going to go back to sleep so that he wouldn't have to think about it. Patient denies SI, HI, AVH, and pain at this time. Patient's goal for today is getting his "life back together", in which he will "work on my future", in order to accomplish his goal.  A- Scheduled medications administered to patient, per MD orders. Support and encouragement provided.  Routine safety checks conducted every 15 minutes.  Patient informed to notify staff with problems or concerns.  R- No adverse drug reactions noted. Patient contracts for safety at this time. Patient compliant with medications and treatment plan. Patient receptive, calm, and cooperative. Patient interacts well with others on the unit.  Patient remains safe at this time.  Problem: Education: Goal: Knowledge of [facility] Education information/materials will improve Outcome: Progressing   Problem: Coping: Goal: Ability to verbalize frustrations and anger appropriately will improve Outcome: Progressing Goal: Ability to demonstrate self-control will improve Outcome: Progressing   Problem: Safety: Goal: Periods of time without injury will increase Outcome: Progressing   Problem: Education: Goal: Knowledge of the prescribed therapeutic regimen will improve Outcome: Progressing   Problem: Medication: Goal: Compliance with prescribed medication regimen will improve Outcome: Progressing

## 2019-11-09 NOTE — Progress Notes (Signed)
Patient was in his room upon arrival to the unit. Patient was pleasant during assessment denying SI/HI/AVH. Patient endorses depression and anxiety rating them 10/10 stating, "My depression and anxiety are so high because I am not sure where I am going when I get out of here." Patient given support and comfort. Patient compliant with medication administration per MD orders. Patient observed interacting appropriately with staff and peers on the unit. Patient given education to be active in his treatment plan. Patient being monitored Q 15 minutes for safety per unit protocol. Patient remains safe on the unit.

## 2019-11-09 NOTE — Progress Notes (Signed)
[facility] MD Progress Note  11/09/2019 5:17 PM Richard [name]  MRN:  [id number] Subjective: Follow-up patient with depression.  Patient says he feels down depressed and angry today because of his social situation.  Continues to have no place to live.  He has been out of his room a little more than usual today.  Physically he says his energy is okay.  I note that his blood sugars have been running even higher up well over 300 multiple times. Principal Problem: MDD (major depressive disorder) Diagnosis: Principal Problem:   MDD (major depressive disorder) Active Problems:   Diabetes ([place])   HTN (hypertension)   Cocaine abuse ([place])   Below-knee amputation of left lower extremity (HCC)   PAD (peripheral artery disease) ([place])  Total Time spent with patient: 30 minutes  Past Psychiatric History: Past history of substance abuse  Past Medical History:  Past Medical History:  Diagnosis Date  . Atrial fibrillation ([place])    per patient  . Cancer ([place])   . CHF (congestive heart failure) ([place])   . Chronic back pain   . Chronic leg pain   . Coronary artery disease   . Diabetes mellitus without complication ([place])   . Hypertension   . Neuropathy     Past Surgical History:  Procedure Laterality Date  . AMPUTATION Left 03/13/2019   Procedure: AMPUTATION BELOW KNEE;  Surgeon: Algernon Huxley, MD;  Location: [facility] ORS;  Service: Vascular;  Laterality: Left;  . CORONARY ANGIOPLASTY WITH STENT PLACEMENT    . EMBOLECTOMY Left 03/12/2019   Procedure: EMBOLECTOMY SFA POPLITEAL;  Surgeon: Algernon Huxley, MD;  Location: [facility] ORS;  Service: Vascular;  Laterality: Left;  . ENDARTERECTOMY FEMORAL Left 03/12/2019   Procedure: ENDARTERECTOMY FEMORAL;  Surgeon: Algernon Huxley, MD;  Location: [facility] ORS;  Service: Vascular;  Laterality: Left;  . ESOPHAGOGASTRODUODENOSCOPY N/A 09/11/2017   Procedure: ESOPHAGOGASTRODUODENOSCOPY (EGD);  Surgeon: Virgel Manifold, MD;  Location: [facility] ENDOSCOPY;  Service: Endoscopy;  Laterality:  N/A;  . LOWER EXTREMITY ANGIOGRAPHY Left 11/17/2018   Procedure: Lower Extremity Angiography, with possible intervention;  Surgeon: Algernon Huxley, MD;  Location: [place] CV LAB;  Service: Cardiovascular;  Laterality: Left;  . LOWER EXTREMITY ANGIOGRAPHY Left 03/04/2019   Procedure: Lower Extremity Angiography;  Surgeon: Algernon Huxley, MD;  Location: [place] CV LAB;  Service: Cardiovascular;  Laterality: Left;  . LOWER EXTREMITY ANGIOGRAPHY Left 07/01/2019   Procedure: LOWER EXTREMITY ANGIOGRAPHY;  Surgeon: Algernon Huxley, MD;  Location: [place] CV LAB;  Service: Cardiovascular;  Laterality: Left;  . pancreas removed     Family History:  Family History  Problem Relation Age of Onset  . CAD Mother   . Diabetes Mellitus II Mother   . Diabetes Mellitus II Father    Family Psychiatric  History: See previous Social History:  Social History   Substance and Sexual Activity  Alcohol Use No  . Alcohol/week: 0.0 standard drinks     Social History   Substance and Sexual Activity  Drug Use Yes  . Types: Cocaine, Benzodiazepines   Comment: Has used in the past-heroin. Cocaine used over a year ago per pt.    Social History   Socioeconomic History  . Marital status: Single    Spouse name: Not on file  . Number of children: Not on file  . Years of education: Not on file  . Highest education level: Not on file  Occupational History  . Not on file  Tobacco Use  . Smoking  status: Current Every Day Smoker    Packs/day: 0.50    Years: 50.00    Pack years: 25.00  . Smokeless tobacco: Never Used  Substance and Sexual Activity  . Alcohol use: No    Alcohol/week: 0.0 standard drinks  . Drug use: Yes    Types: Cocaine, Benzodiazepines    Comment: Has used in the past-heroin. Cocaine used over a year ago per pt.  . Sexual activity: Not Currently  Other Topics Concern  . Not on file  Social History Narrative  . Not on file   Social Determinants of Health   Financial Resource  Strain:   . Difficulty of Paying Living Expenses: Not on file  Food Insecurity:   . Worried About Charity fundraiser in the Last Year: Not on file  . Ran Out of Food in the Last Year: Not on file  Transportation Needs:   . Lack of Transportation (Medical): Not on file  . Lack of Transportation (Non-Medical): Not on file  Physical Activity:   . Days of Exercise per Week: Not on file  . Minutes of Exercise per Session: Not on file  Stress:   . Feeling of Stress : Not on file  Social Connections:   . Frequency of Communication with Friends and Family: Not on file  . Frequency of Social Gatherings with Friends and Family: Not on file  . Attends Religious Services: Not on file  . Active Member of Clubs or Organizations: Not on file  . Attends Archivist Meetings: Not on file  . Marital Status: Not on file   Additional Social History:                         Sleep: Fair  Appetite:  Poor  Current Medications: Current Facility-Administered Medications  Medication Dose Route Frequency Provider Last Rate Last Admin  . alum & mag hydroxide-simeth (MAALOX/MYLANTA) 200-200-20 MG/5ML suspension 30 mL  30 mL Oral Q4H PRN Cristofano, Dorene Ar, MD      . apixaban (ELIQUIS) tablet 5 mg  5 mg Oral Q12H Cristofano, Dorene Ar, MD   5 mg at 11/09/19 0805  . carvedilol (COREG) tablet 6.25 mg  6.25 mg Oral BID WC Jahnay Lantier T, MD   6.25 mg at 11/09/19 0809  . escitalopram (LEXAPRO) tablet 5 mg  5 mg Oral Daily Cristofano, Dorene Ar, MD   5 mg at 11/09/19 0809  . famotidine (PEPCID) tablet 20 mg  20 mg Oral BID Cristofano, Dorene Ar, MD   20 mg at 11/09/19 [id number]  . feeding supplement (ENSURE ENLIVE) (ENSURE ENLIVE) liquid 237 mL  237 mL Oral BID BM Cristofano, Paul A, MD   237 mL at 11/09/19 1100  . folic acid (FOLVITE) tablet 1 mg  1 mg Oral Daily Cristofano, Dorene Ar, MD   1 mg at 11/09/19 0808  . gabapentin (NEURONTIN) capsule 300 mg  300 mg Oral TID Marcedes Tech T, MD   300 mg at 11/09/19  1146  . hydrOXYzine (ATARAX/VISTARIL) tablet 25 mg  25 mg Oral TID PRN Cristofano, Paul A, MD      . insulin aspart (novoLOG) injection 0-9 Units  0-9 Units Subcutaneous TID AC & HS Cristofano, Dorene Ar, MD   9 Units at 11/09/19 1147  . [START ON 11/10/2019] insulin aspart (novoLOG) injection 6 Units  6 Units Subcutaneous TID WC Lavontae Cornia T, MD      . insulin glargine (LANTUS) injection 15  Units  15 Units Subcutaneous QHS Amandalynn Pitz T, MD      . magnesium hydroxide (MILK OF MAGNESIA) suspension 30 mL  30 mL Oral Daily PRN Cristofano, Paul A, MD      . mirtazapine (REMERON) tablet 30 mg  30 mg Oral QHS Mena Simonis T, MD   30 mg at 11/08/19 2142  . multivitamin with minerals tablet 1 tablet  1 tablet Oral Daily Cristofano, Dorene Ar, MD   1 tablet at 11/09/19 0809  . rosuvastatin (CRESTOR) tablet 40 mg  40 mg Oral q1800 Cristofano, Dorene Ar, MD   40 mg at 11/08/19 1701  . temazepam (RESTORIL) capsule 15 mg  15 mg Oral QHS Guynell Kleiber T, MD      . thiamine tablet 100 mg  100 mg Oral Daily Cristofano, Dorene Ar, MD   100 mg at 11/09/19 0808  . traZODone (DESYREL) tablet 50 mg  50 mg Oral QHS Ocean Kearley, Madie Reno, MD   50 mg at 11/08/19 2142    Lab Results:  Results for orders placed or performed during the hospital encounter of 11/04/19 (from the past 48 hour(s))  Glucose, capillary     Status: Abnormal   Collection Time: 11/07/19  9:02 PM  Result Value Ref Range   Glucose-Capillary 337 (H) 70 - 99 mg/dL  Glucose, capillary     Status: Abnormal   Collection Time: 11/08/19  6:37 AM  Result Value Ref Range   Glucose-Capillary 167 (H) 70 - 99 mg/dL  Glucose, capillary     Status: Abnormal   Collection Time: 11/08/19  8:53 AM  Result Value Ref Range   Glucose-Capillary 298 (H) 70 - 99 mg/dL   Comment 1 Notify RN   Glucose, capillary     Status: Abnormal   Collection Time: 11/08/19 11:10 AM  Result Value Ref Range   Glucose-Capillary 200 (H) 70 - 99 mg/dL  Glucose, capillary     Status: Abnormal    Collection Time: 11/08/19  4:27 PM  Result Value Ref Range   Glucose-Capillary 263 (H) 70 - 99 mg/dL   Comment 1 Notify RN   Glucose, capillary     Status: Abnormal   Collection Time: 11/08/19  9:01 PM  Result Value Ref Range   Glucose-Capillary 320 (H) 70 - 99 mg/dL  Glucose, capillary     Status: Abnormal   Collection Time: 11/09/19  7:05 AM  Result Value Ref Range   Glucose-Capillary 388 (H) 70 - 99 mg/dL  Glucose, capillary     Status: Abnormal   Collection Time: 11/09/19 11:43 AM  Result Value Ref Range   Glucose-Capillary 353 (H) 70 - 99 mg/dL  Glucose, capillary     Status: Abnormal   Collection Time: 11/09/19  4:38 PM  Result Value Ref Range   Glucose-Capillary 395 (H) 70 - 99 mg/dL   Comment 1 Notify RN     Blood Alcohol level:  Lab Results  Component Value Date   ETH <10 10/29/2019   ETH <10 0000000    Metabolic Disorder Labs: Lab Results  Component Value Date   HGBA1C 14.9 (H) 10/30/2019   MPG 380.93 10/30/2019   MPG >398 02/28/2019   No results found for: PROLACTIN Lab Results  Component Value Date   CHOL 186 08/10/2015   TRIG 91 10/29/2019   HDL 25 (L) 08/10/2015   CHOLHDL 7.4 08/10/2015   VLDL 29 08/10/2015   LDLCALC 132 (H) 08/10/2015    Physical Findings: AIMS:  , ,  ,  ,  CIWA:    COWS:     Musculoskeletal: Strength & Muscle Tone: within normal limits Gait & Station: normal Patient leans: N/A  Psychiatric Specialty Exam: Physical Exam  Nursing note and vitals reviewed. Constitutional: He appears well-developed and well-nourished.  HENT:  Head: Normocephalic and atraumatic.  Eyes: Pupils are equal, round, and reactive to light. Conjunctivae are normal.  Cardiovascular: Regular rhythm and normal heart sounds.  Respiratory: Effort normal. No respiratory distress.  GI: Soft.  Musculoskeletal:        General: Normal range of motion.     Cervical back: Normal range of motion.  Neurological: He is alert.  Skin: Skin is warm and  dry.  Psychiatric: Judgment normal. His affect is blunt. His speech is delayed. He is slowed. Cognition and memory are normal. He exhibits a depressed mood. He expresses no suicidal ideation.    Review of Systems  Constitutional: Negative.   HENT: Negative.   Eyes: Negative.   Respiratory: Negative.   Cardiovascular: Negative.   Gastrointestinal: Negative.   Musculoskeletal: Negative.   Skin: Negative.   Neurological: Negative.   Psychiatric/Behavioral: Positive for dysphoric mood.    Blood pressure 127/66, pulse 85, temperature 98.5 F (36.9 C), temperature source Oral, resp. rate 18, height 5\' 11"  (1.803 m), SpO2 100 %.Body mass index is 23.57 kg/m.  General Appearance: Casual  Eye Contact:  Good  Speech:  Clear and Coherent  Volume:  Normal  Mood:  Euthymic  Affect:  Congruent  Thought Process:  Goal Directed  Orientation:  Full (Time, Place, and Person)  Thought Content:  Logical  Suicidal Thoughts:  No  Homicidal Thoughts:  No  Memory:  Immediate;   Fair Recent;   Fair Remote;   Fair  Judgement:  Fair  Insight:  Fair  Psychomotor Activity:  Normal  Concentration:  Concentration: Fair  Recall:  [employer] of Knowledge:  Fair  Language:  Fair  Akathisia:  No  Handed:  Right  AIMS (if indicated):     Assets:  Desire for Improvement  ADL's:  Intact  Cognition:  WNL  Sleep:  Number of Hours: 7.25     Treatment Plan Summary: Plan Increase insulin to 6 mg with each meal and 15 mg at night.  Patient still complaining that he is not getting enough food but is more satisfied with the soft diet.  He continues to complain of not sleeping at night although charting indicates 7 hours.  I will add a small dose of Restoril at nighttime.  Encourage patient to keep working on finding a place to live  Alethia Berthold, MD 11/09/2019, 5:17 PM

## 2019-11-09 NOTE — [facility] Group Notes (Signed)
LCSW Group Therapy Note   11/09/2019 1:00 PM  Type of Therapy and Topic:  Group Therapy:  Overcoming Obstacles   Participation Level:  Minimal   Description of Group:    In this group patients will be encouraged to explore what they see as obstacles to their own wellness and recovery. They will be guided to discuss their thoughts, feelings, and behaviors related to these obstacles. The group will process together ways to cope with barriers, with attention given to specific choices patients can make. Each patient will be challenged to identify changes they are motivated to make in order to overcome their obstacles. This group will be process-oriented, with patients participating in exploration of their own experiences as well as giving and receiving support and challenge from other group members.   Therapeutic Goals: 1. Patient will identify personal and current obstacles as they relate to admission. 2. Patient will identify barriers that currently interfere with their wellness or overcoming obstacles.  3. Patient will identify feelings, thought process and behaviors related to these barriers. 4. Patient will identify two changes they are willing to make to overcome these obstacles:      Summary of Patient Progress Patient was present for group.  Patient did not participate in group beyond stating that he plans to use medications as a effective coping skill.  Patient clarified that he plans on taking his medications as prescribed and complying with medication use.     Therapeutic Modalities:   Cognitive Behavioral Therapy Solution Focused Therapy Motivational Interviewing Relapse Prevention Therapy  Assunta Curtis, MSW, LCSW 11/09/2019 12:11 PM

## 2019-11-09 NOTE — Progress Notes (Signed)
Inpatient Diabetes Program Recommendations  AACE/ADA: New Consensus Statement on Inpatient Glycemic Control   Target Ranges:  Prepandial:   less than 140 mg/dL      Peak postprandial:   less than 180 mg/dL (1-2 hours)      Critically ill patients:  140 - 180 mg/dL   Results for ARBEN, KARAGIANNIS (MRN [id number]) as of 11/09/2019 12:27  Ref. Range 11/08/2019 06:37 11/08/2019 08:53 11/08/2019 11:10 11/08/2019 16:27 11/08/2019 21:01 11/09/2019 07:05 11/09/2019 11:43  Glucose-Capillary Latest Ref Range: 70 - 99 mg/dL 167 (H) 298 (H) 200 (H) 263 (H) 320 (H) 388 (H) 353 (H)   Review of Glycemic Control  Current orders for Inpatient glycemic control: Lantus 10 units QHS, Novolog 3 units TID with meals, Novolog 0-9 units AC&HS  Inpatient Diabetes Program Recommendations:   Insulin - Meal Coverage: Please consider increasing meal coverage to Novolog 7 units TID with meals if patient eats at least 50% of meals.  Diet: Added Carb Modified to soft diet.  Thanks, Barnie Alderman, RN, MSN, CDE Diabetes Coordinator Inpatient Diabetes Program [phone] (Team Pager from 8am to 5pm)

## 2019-11-09 NOTE — Progress Notes (Signed)
Patient slept throughout the night and appears to be comfortable in bed. Woke up in the morning and went to the dayroom for vital signs. Alert and oriented. No sign of distress. Staff continue to provide support and encouragements. Safety monitored as expected.

## 2019-11-09 NOTE — Plan of Care (Signed)
Patient compliant with medication administration per MD orders  Problem: Medication: Goal: Compliance with prescribed medication regimen will improve Outcome: Progressing

## 2019-11-10 LAB — GLUCOSE, CAPILLARY
Glucose-Capillary: 258 mg/dL — ABNORMAL HIGH (ref 70–99)
Glucose-Capillary: 451 mg/dL — ABNORMAL HIGH (ref 70–99)
Glucose-Capillary: 374 mg/dL — ABNORMAL HIGH (ref 70–99)
Glucose-Capillary: 258 mg/dL — ABNORMAL HIGH (ref 70–99)

## 2019-11-10 LAB — CBC
HCT: 30.1 % — ABNORMAL LOW (ref 39.0–52.0)
Hemoglobin: 9.4 g/dL — ABNORMAL LOW (ref 13.0–17.0)
MCH: 28.3 pg (ref 26.0–34.0)
MCHC: 31.2 g/dL (ref 30.0–36.0)
MCV: 90.7 fL (ref 80.0–100.0)
Platelets: 558 10*3/uL — ABNORMAL HIGH (ref 150–400)
RBC: 3.32 MIL/uL — ABNORMAL LOW (ref 4.22–5.81)
RDW: 14.1 % (ref 11.5–15.5)
WBC: 8.8 10*3/uL (ref 4.0–10.5)
nRBC: 0 % (ref 0.0–0.2)

## 2019-11-10 LAB — CREATININE, SERUM
Creatinine, Ser: 0.78 mg/dL (ref 0.61–1.24)
GFR calc Af Amer: >60 mL/min (ref >60)
GFR calc non Af Amer: >60 mL/min (ref >60)

## 2019-11-10 MED ORDER — INSULIN ASPART 100 UNIT/ML ~~LOC~~ SOLN
10.0000 [IU] | Freq: Three times a day (TID) | SUBCUTANEOUS | Status: DC
  Administered 2019-11-10 – 2019-11-12 (×5): 10 [IU] via SUBCUTANEOUS
  Filled 2019-11-10 (×5): qty 1

## 2019-11-10 MED ORDER — INSULIN ASPART 100 UNIT/ML ~~LOC~~ SOLN
0.0000 [IU] | Freq: Three times a day (TID) | SUBCUTANEOUS | Status: DC
  Administered 2019-11-10: 17:00:00 15 [IU] via SUBCUTANEOUS
  Administered 2019-11-11: 5 [IU] via SUBCUTANEOUS
  Administered 2019-11-11: 12:00:00 8 [IU] via SUBCUTANEOUS
  Administered 2019-11-11: 3 [IU] via SUBCUTANEOUS
  Administered 2019-11-12 (×2): 5 [IU] via SUBCUTANEOUS
  Administered 2019-11-12: 3 [IU] via SUBCUTANEOUS
  Administered 2019-11-13 (×2): 8 [IU] via SUBCUTANEOUS
  Administered 2019-11-13: 5 [IU] via SUBCUTANEOUS
  Filled 2019-11-10 (×8): qty 1

## 2019-11-10 MED ORDER — INSULIN GLARGINE 100 UNIT/ML ~~LOC~~ SOLN
15.0000 [IU] | Freq: Every day | SUBCUTANEOUS | Status: DC
  Administered 2019-11-10: 22:00:00 15 [IU] via SUBCUTANEOUS
  Filled 2019-11-10 (×2): qty 0.15

## 2019-11-10 MED ORDER — GLUCERNA SHAKE PO LIQD
237.0000 mL | Freq: Two times a day (BID) | ORAL | Status: DC
  Administered 2019-11-10 – 2019-11-13 (×7): 237 mL via ORAL

## 2019-11-10 MED ORDER — MIRTAZAPINE 15 MG PO TABS
45.0000 mg | ORAL_TABLET | Freq: Every day | ORAL | Status: DC
  Administered 2019-11-10 – 2019-11-12 (×3): 45 mg via ORAL
  Filled 2019-11-10 (×3): qty 3

## 2019-11-10 MED ORDER — INSULIN ASPART 100 UNIT/ML ~~LOC~~ SOLN
0.0000 [IU] | Freq: Every day | SUBCUTANEOUS | Status: DC
  Administered 2019-11-10: 22:00:00 3 [IU] via SUBCUTANEOUS
  Administered 2019-11-11: 21:00:00 2 [IU] via SUBCUTANEOUS
  Administered 2019-11-12: 21:00:00 4 [IU] via SUBCUTANEOUS
  Filled 2019-11-10 (×3): qty 1

## 2019-11-10 NOTE — Progress Notes (Signed)
Recreation Therapy Notes  Date: 11/10/2019  Time: 9:30 am   Location: Craft room   Behavioral response: N/A   Intervention Topic: Self-care  Discussion/Intervention: Patient did not attend group.   Clinical Observations/Feedback:  Patient did not attend group.   Neira Bentsen LRT/CTRS        Spike Desilets 11/10/2019 11:07 AM

## 2019-11-10 NOTE — Consult Note (Signed)
[facility] Initial Consultation Note   Patient Name: Richard [name]    NTI:[id number] PCP: Patient, No Pcp Per     DOB: [birth year]  DOA: 11/04/2019 DOS: the patient was seen and examined on 11/10/2019   Referring physician: Dr. Weber Cooks Reason for consult: Diabetes management  HPI: Richard [name] is a 63 y.o. male with Past medical history of A. fib, CHF, CAD, substance abuse, type II DM. Patient is coming from Home Patient was initially admitted on 10/29/2019 after being found on the ground confused and altered.  Upon arrival patient was febrile tachycardic and confused.  Patient started having vomiting in the ER.  He was intubated in the ER for airway protection.  Admitted to the ICU.  Patient was extubated and transferred to floor. After patient was able to tolerate oral diet patient was medically cleared and transferred to behavioral health for ongoing suicidal and homicidal ideation. Patient continues to have significantly elevated sugars despite persistent adjustment and therefore hospitalist was consulted. At the time of my evaluation patient reports polyuria but denies any other complaints of nausea vomiting chest pain abdominal pain no fever no chills no dizziness or lightheadedness. Patient is able to tolerate oral diet without any concern.  No dizziness no lightheadedness no focal deficit.  Review of Systems: as mentioned in the history of present illness.  All other systems reviewed and are negative.  Past Medical History:  Diagnosis Date  . Atrial fibrillation ([place])    per patient  . Cancer ([place])   . CHF (congestive heart failure) ([place])   . Chronic back pain   . Chronic leg pain   . Coronary artery disease   . Diabetes mellitus without complication ([place])   . Hypertension   . Neuropathy    Past Surgical History:  Procedure Laterality Date  . AMPUTATION Left 03/13/2019   Procedure: AMPUTATION BELOW KNEE;  Surgeon: Algernon Huxley, MD;  Location: [facility] ORS;  Service:  Vascular;  Laterality: Left;  . CORONARY ANGIOPLASTY WITH STENT PLACEMENT    . EMBOLECTOMY Left 03/12/2019   Procedure: EMBOLECTOMY SFA POPLITEAL;  Surgeon: Algernon Huxley, MD;  Location: [facility] ORS;  Service: Vascular;  Laterality: Left;  . ENDARTERECTOMY FEMORAL Left 03/12/2019   Procedure: ENDARTERECTOMY FEMORAL;  Surgeon: Algernon Huxley, MD;  Location: [facility] ORS;  Service: Vascular;  Laterality: Left;  . ESOPHAGOGASTRODUODENOSCOPY N/A 09/11/2017   Procedure: ESOPHAGOGASTRODUODENOSCOPY (EGD);  Surgeon: Virgel Manifold, MD;  Location: [facility] ENDOSCOPY;  Service: Endoscopy;  Laterality: N/A;  . LOWER EXTREMITY ANGIOGRAPHY Left 11/17/2018   Procedure: Lower Extremity Angiography, with possible intervention;  Surgeon: Algernon Huxley, MD;  Location: [place] CV LAB;  Service: Cardiovascular;  Laterality: Left;  . LOWER EXTREMITY ANGIOGRAPHY Left 03/04/2019   Procedure: Lower Extremity Angiography;  Surgeon: Algernon Huxley, MD;  Location: [place] [facility];  Service: Cardiovascular;  Laterality: Left;  . LOWER EXTREMITY ANGIOGRAPHY Left 07/01/2019   Procedure: LOWER EXTREMITY ANGIOGRAPHY;  Surgeon: Algernon Huxley, MD;  Location: [place] [facility] LAB;  Service: Cardiovascular;  Laterality: Left;  . pancreas removed     Social History:  reports that he has been smoking. He has a 25.00 pack-year smoking history. He has never used smokeless tobacco. He reports current drug use. Drugs: Cocaine and Benzodiazepines. He reports that he does not drink alcohol.  No Known Allergies  Family history reviewed and not pertinent Family History  Problem Relation Age of Onset  . CAD Mother   . Diabetes Mellitus II Mother   .  Diabetes Mellitus II Father     Prior to Admission medications   Medication Sig Start Date End Date Taking? Authorizing Provider  ACCU-CHEK AVIVA PLUS test strip U ONCE TO BID UTD 09/23/18   Nicholes Mango, MD  ACCU-CHEK SOFTCLIX LANCETS lancets TEST 1-2 XD 09/23/18   Gouru, Illene Silver, MD    acetaminophen (TYLENOL) 325 MG tablet Take 1 tablet (325 mg total) by mouth every 6 (six) hours as needed for mild pain (or Fever >/= 101). 01/11/18   Gouru, Aruna, MD  apixaban (ELIQUIS) 5 MG TABS tablet Take 1 tablet (5 mg total) by mouth 2 (two) times daily. Patient not taking: Reported on 10/29/2019 09/23/18   Nicholes Mango, MD  Blood Glucose Monitoring Suppl (ACCU-CHEK AVIVA PLUS) w/Device KIT U TO TEST ONCE TO BID 09/23/18   Gouru, Illene Silver, MD  carvedilol (COREG) 3.125 MG tablet Take 1 tablet (3.125 mg total) by mouth 2 (two) times daily with a meal. Patient not taking: Reported on 10/29/2019 03/18/19   Bettey Costa, MD  Ensure Max Protein (ENSURE MAX PROTEIN) LIQD Take 330 mLs (11 oz total) by mouth 2 (two) times daily between meals. Patient not taking: Reported on 10/29/2019 03/18/19   Bettey Costa, MD  gabapentin (NEURONTIN) 100 MG capsule Take 1 capsule (100 mg total) by mouth 3 (three) times daily. Patient taking differently: Take 100 mg by mouth daily as needed.  08/04/18   Salary, Avel Peace, MD  insulin aspart (NOVOLOG) 100 UNIT/ML injection Inject 0-5 Units into the skin at bedtime. CBG < 70: implement hypoglycemia protocol CBG 70 - 120: 0 units CBG 121 - 150: 0 units CBG 151 - 200: 0 units CBG 201 - 250: 2 units CBG 251 - 300: 3 units CBG 301 - 350: 4 units CBG 351 - 400: 5 units CBG > 400: call MD 11/18/18   Dustin Flock, MD  insulin aspart (NOVOLOG) 100 UNIT/ML injection Inject 10 Units into the skin 3 (three) times daily with meals. 03/18/19   Bettey Costa, MD  insulin glargine (LANTUS) 100 UNIT/ML injection Inject 0.07 mLs (7 Units total) into the skin daily. 11/04/19   Lavina Hamman, MD  Multiple Vitamin (MULTIVITAMIN WITH MINERALS) TABS tablet Take 1 tablet by mouth daily. 01/12/18   Nicholes Mango, MD  Needles & Syringes MISC Use as directed Patient not taking: Reported on 10/29/2019 09/23/18   Nicholes Mango, MD  pantoprazole (PROTONIX) 40 MG tablet Take 1 tablet (40 mg total) by mouth  daily. Patient not taking: Reported on 10/29/2019 03/18/19   Bettey Costa, MD  rosuvastatin (CRESTOR) 40 MG tablet Take 1 tablet (40 mg total) by mouth daily at 6 PM. Patient not taking: Reported on 10/29/2019 03/18/19   Bettey Costa, MD  thiamine 100 MG tablet Take 1 tablet (100 mg total) by mouth daily. 11/05/19   Lavina Hamman, MD  ticagrelor (BRILINTA) 90 MG TABS tablet Take 1 tablet (90 mg total) by mouth 2 (two) times daily. Patient not taking: Reported on 10/29/2019 03/18/19   Bettey Costa, MD    Physical Exam: Vitals:   11/09/19 0611 11/09/19 1746 11/10/19 0803 11/10/19 1609  BP: 127/66 (!) 129/58 126/71 (!) 146/75  Pulse: 85 87 85 83  Resp: 18     Temp: 98.5 F (36.9 C)  99 F (37.2 C)   TempSrc: Oral  Oral   SpO2: 100%  100%   Height:       General: alert and oriented to time, place, and person. Appear in mild distress,  affect anxious Eyes: PERRL, Conjunctiva normal ENT: Oral Mucosa Clear, moist  Neck: no JVD, no Abnormal Mass Or lumps Cardiovascular: S1 and S2 Present, no Murmur, peripheral pulses symmetrical Respiratory: normal respiratory effort, Bilateral Air entry equal and Decreased, no use of accessory muscle, Clear to Auscultation, no Crackles, no wheezes Abdomen: Bowel Sound present, Soft and no tenderness, no hernia Skin: no rashes  Extremities: no Pedal edema, no calf tenderness left BKA Neurologic: without any new focal findings Gait not checked due to patient safety concerns   Labs:  CBC: Recent Labs  Lab 11/07/19 0736 11/10/19 0637  WBC 9.4 8.8  HGB 9.9* 9.4*  HCT 31.8* 30.1*  MCV 90.9 90.7  PLT 548* 542*   Basic Metabolic Panel: Recent Labs  Lab 11/04/19 0612 11/06/19 2134 11/07/19 0736 11/10/19 0637  NA 139  --   --   --   K 3.6  --   --   --   CL 103  --   --   --   CO2 29  --   --   --   GLUCOSE 135* 469*  --   --   BUN <5*  --   --   --   CREATININE 0.65  --  0.89 0.78  CALCIUM 8.1*  --   --   --   MG 2.0  --   --   --   PHOS 3.5  --    --   --    Liver Function Tests: No results for input(s): AST, ALT, ALKPHOS, BILITOT, PROT, ALBUMIN in the last 168 hours. No results for input(s): LIPASE, AMYLASE in the last 168 hours. No results for input(s): AMMONIA in the last 168 hours.  Cardiac Enzymes: No results for input(s): CKTOTAL, CKMB, CKMBINDEX, TROPONINI in the last 168 hours. No results for input(s): PROBNP in the last 8760 hours.  CBG: Recent Labs  Lab 11/09/19 1638 11/09/19 2058 11/10/19 0701 11/10/19 1133 11/10/19 1611  GLUCAP 395* 268* 258* 451* 374*    Radiological Exams: No results found.  Assessment/Plan Principal Problem:   MDD (major depressive disorder) Active Problems:   Diabetes ([place])   HTN (hypertension)   Cocaine abuse ([place])   Below-knee amputation of left lower extremity ([facility])   PAD (peripheral artery disease) ([place])    1.  Type 2 diabetes mellitus, uncontrolled with hyperglycemia. Recently admitted for DKA Significantly elevated blood sugar. At present I will increase the dose of the Lantus to 15 units. Patient's home Lantus is 19 units but patient had multiple episodes of low sugars in the hospital. Appears that the patient's glucose is elevated due to improvement in his oral intake after discharge from the hospital.  Increase the sliding scale also from sensitive to moderate. Also increase meal coverage from 6 units to 10 units which is patient's home dose. Also discontinue Ensure and switch to Glucerna although suspect that that is not going to affect her sugars significantly. Continue to monitor sugars today and adjust Lantus tomorrow. We will continue to follow-up on tomorrow. Currently does not appear to be having any significant abnormality like DKA nor have any dehydration evidence.  2.  Essential hypertension. Continue home regimen.  3.  Paroxysmal A. fib. Continue home regimen. Continue Eliquis.  4.  Severe protein calorie malnutrition. Continuing nutritional  supplements.  5.  Suicidal ideation. Severe depression. Polysubstance abuse. Management per psychiatry.  Family Communication: none at bedside Primary team communication: Discussed with Dr. Weber Cooks  Thank you very much for  involving [place] in care of your patient.  We will continue to follow the patient.    Author: Berle Mull, MD [facility] 11/10/2019 7:12 PM    If 7PM-7AM, please contact night-coverage [email or web address]

## 2019-11-10 NOTE — [facility] Counselor (Signed)
CSW contacted the patient's sister Bridget Hartshorn at [phone]. Sister did not answer and HIPAA compliant VM was left.   Assunta Curtis, MSW, LCSW 11/10/2019 1:15 PM

## 2019-11-10 NOTE — Progress Notes (Signed)
Physical Therapy Treatment Patient Details Name: Richard [name] MRN: [id number] DOB: 07/10/1956 Today's Date: 11/10/2019    History of Present Illness 25yoM who comes [facility] on 1/7 after being found on the ground, confused and altered in his house today. Patient's significant other [name] reported that the patient was doing drugs all last night. intubated for airway protection 2/2 emesis. admit the patient to ICU for further work-up and treatment of acute hypoxic respiratory failure in the setting of  aspiration pneumonitis secondary to acute cocaine toxicity with significant metabolic encephalopathy with seizure-like activity.  Patient also noted to be in DKA. Extubated to RA on 1/10. PMH: polysubstance abuse, CHF, hypertension, diabetes mellitus, A. fib    PT Comments    Pt in bed in sidelying and reporting abdominal pain which has been present for several days, nursing already notified.  Pt hesitantly agreed to PT and was able to complete several seated exercises as well as ambulation (~300 ft).  Pt with prosthesis donned, tendency to rotate internally and pt aware.  He was familiar with use of RW but required reminders on occasion.  He did not require rest breaks or experience any LOB's.  Pt open to all education but refusing some exercises due to abdominal pain.  He will continue to benefit from skilled PT with focus on strength, tolerance to activity and safe functional mobility.  Follow Up Recommendations  Outpatient PT     Equipment Recommendations  (likely does not need w/c currently, hopes to start [facility])    Recommendations for Other Services       Precautions / Restrictions Precautions Precautions: Fall Restrictions Weight Bearing Restrictions: No RLE Weight Bearing: Weight bearing as tolerated LLE Weight Bearing: Weight bearing as tolerated    Mobility  Bed Mobility Overal bed mobility: Modified Independent             General bed mobility comments: Increased  time  Transfers Overall transfer level: Needs assistance Equipment used: Rolling walker (2 wheeled) Transfers: Sit to/from Stand Sit to Stand: Min guard         General transfer comment: Slow to stand, some cues for use of RW.  Ambulation/Gait Ambulation/Gait assistance: Min guard Gait Distance (Feet): 300 Feet Assistive device: Rolling walker (2 wheeled) Gait Pattern/deviations: Decreased step length - right;Decreased stance time - left   Gait velocity interpretation: <1.8 ft/sec, indicate of risk for recurrent falls General Gait Details: Pt able to complete ambulation with no rest breaks, with VC's for use of RW/positioning.  Importance of frequent ambulation emphasized to pt as nursing stated he has only been using his WC.  Pt hopes to wean to use of SPC.   Stairs             Wheelchair Mobility    Modified Rankin (Stroke Patients Only)       Balance Overall balance assessment: History of Falls;Mild deficits observed, not formally tested;Needs assistance Sitting-balance support: No upper extremity supported;Feet unsupported Sitting balance-Leahy Scale: Normal     Standing balance support: During functional activity;Bilateral upper extremity supported Standing balance-Leahy Scale: Good Standing balance comment: Pt is able to maintain dynamic standing/ambulation with expected level of walker use, no LOBs or overt safety concerns.                            Cognition Arousal/Alertness: Awake/alert Behavior During Therapy: WFL for tasks assessed/performed Overall Cognitive Status: Within Functional Limits for tasks assessed  Exercises Other Exercises Other Exercises: Ther. ex: Seated marching, pillow squeezes, LAQ's, hip abduction (manually resisted) x20 bilaterally. Other Exercises: Education: seated leanback for abdominal strengthening which pt was not able to complete due to abdominal pain.   Also refused STS exercises.    General Comments        Pertinent Vitals/Pain Pain Assessment: Faces Faces Pain Scale: Hurts little more Pain Location: In sidelying reporting abdominal pain at beginning of session and again throughout session. Pain Descriptors / Indicators: Aching Pain Intervention(s): Monitored during session    Home Living                      Prior Function            PT Goals (current goals can now be found in the care plan section) Acute Rehab PT Goals Patient Stated Goal: regain strength improve AMB PT Goal Formulation: With patient Time For Goal Achievement: 11/17/19 Potential to Achieve Goals: Good Progress towards PT goals: Progressing toward goals    Frequency    Min 2X/week      PT Plan Current plan remains appropriate    Co-evaluation              AM-PAC PT "6 Clicks" Mobility   Outcome Measure  Help needed turning from your back to your side while in a flat bed without using bedrails?: None Help needed moving from lying on your back to sitting on the side of a flat bed without using bedrails?: None Help needed moving to and from a bed to a chair (including a wheelchair)?: A Little Help needed standing up from a chair using your arms (e.g., wheelchair or bedside chair)?: A Little Help needed to walk in hospital room?: A Little Help needed climbing 3-5 steps with a railing? : A Little 6 Click Score: 20    End of Session Equipment Utilized During Treatment: Gait belt Activity Tolerance: Treatment limited secondary to medical complications (Comment);Patient limited by fatigue;Patient limited by lethargy Patient left: in bed(in w/c, pt planning on wheeling around unit) Nurse Communication: Mobility status PT Visit Diagnosis: Unsteadiness on feet (R26.81);Difficulty in walking, not elsewhere classified (R26.2);Muscle weakness (generalized) (M62.81);History of falling (Z91.81);Other abnormalities of gait and mobility  (R26.89)     Time: [id number] PT Time Calculation (min) (ACUTE ONLY): 24 min  Charges:  $Therapeutic Exercise: 23-37 mins                     Roxanne Gates, PT, DPT    Roxanne Gates 11/10/2019, 6:49 PM

## 2019-11-10 NOTE — Progress Notes (Signed)
Inpatient Diabetes Program Recommendations  AACE/ADA: New Consensus Statement on Inpatient Glycemic Control   Target Ranges:  Prepandial:   less than 140 mg/dL      Peak postprandial:   less than 180 mg/dL (1-2 hours)      Critically ill patients:  140 - 180 mg/dL   Results for STEPHANOS, TIET (MRN [id number]) as of 11/10/2019 15:44  Ref. Range 11/09/2019 07:05 11/09/2019 11:43 11/09/2019 16:38 11/09/2019 20:58 11/10/2019 07:01 11/10/2019 11:33  Glucose-Capillary Latest Ref Range: 70 - 99 mg/dL 388 (H) 353 (H) 395 (H) 268 (H) 258 (H) 451 (H)   Review of Glycemic Control  Outpatient Diabetes medications: Lantus 19 units daily, Novolog 10 units TID with meals Current orders for Inpatient glycemic control: Lantus 15 units QHS, Novolog 0-15 units TID with meals, Novolog 0-5 units QHS, Novolog 10 units TID with meals  Inpatient Diabetes Program Recommendations:   Insulin-Basal: Please consider increasing Lantus to 19 units QHS.  Insulin-Meal Coverage: Noted meal coverage increased today to start with supper.  Consult: May want to consider consulting hospitalist to assist with DM management while inpatient.  Thanks, Barnie Alderman, RN, MSN, CDE Diabetes Coordinator Inpatient Diabetes Program [phone] (Team Pager from 8am to 5pm)

## 2019-11-10 NOTE — [facility] Group Notes (Signed)
  LCSW Group Therapy Note  11/10/2019 12:00 PM   Type of Therapy/Topic:  Group Therapy:  Feelings about Diagnosis  Participation Level:  Did Not Attend   Description of Group:   This group will allow patients to explore their thoughts and feelings about diagnoses they have received. Patients will be guided to explore their level of understanding and acceptance of these diagnoses. Facilitator will encourage patients to process their thoughts and feelings about the reactions of others to their diagnosis and will guide patients in identifying ways to discuss their diagnosis with significant others in their lives. This group will be process-oriented, with patients participating in exploration of their own experiences, giving and receiving support, and processing challenge from other group members.   Therapeutic Goals: 1. Patient will demonstrate understanding of diagnosis as evidenced by identifying two or more symptoms of the disorder 2. Patient will be able to express two feelings regarding the diagnosis 3. Patient will demonstrate their ability to communicate their needs through discussion and/or role play  Summary of Patient Progress: x   Therapeutic Modalities:   Cognitive Behavioral Therapy Brief Therapy Feelings Identification    Evalina Field, MSW, LCSW Clinical Social Work 11/10/2019 12:00 PM

## 2019-11-10 NOTE — Plan of Care (Signed)
D- Patient alert and oriented. Patient presents in a sad, but pleasant mood on assessment reporting that he slept fair last night and had no complaints to voice to this Probation officer. Patient denies SI, HI, AVH, and pain at this time. Patient also denies any signs/symptoms of depression/anxiety to this Probation officer, however, he stated to this writer that he should come to this writer's house so that he can get better. Patient had no stated goals for today.  A- Scheduled medications administered to patient, per MD orders. Support and encouragement provided.  Routine safety checks conducted every 15 minutes.  Patient informed to notify staff with problems or concerns.  R- No adverse drug reactions noted. Patient contracts for safety at this time. Patient compliant with medications and treatment plan. Patient receptive, calm, and cooperative. Patient interacts well with others on the unit.  Patient remains safe at this time.  Problem: Education: Goal: Knowledge of [place] [facility] Education information/materials will improve Outcome: Progressing   Problem: Coping: Goal: Ability to verbalize frustrations and anger appropriately will improve Outcome: Progressing Goal: Ability to demonstrate self-control will improve Outcome: Progressing   Problem: Safety: Goal: Periods of time without injury will increase Outcome: Progressing   Problem: Education: Goal: Knowledge of the prescribed therapeutic regimen will improve Outcome: Progressing   Problem: Medication: Goal: Compliance with prescribed medication regimen will improve Outcome: Progressing

## 2019-11-10 NOTE — Progress Notes (Signed)
[facility] MD Progress Note  11/10/2019 2:20 PM Richard [name]  MRN:  [id number] Subjective: Follow-up for this 64 year old man with depression and substance abuse and multiple medical and social problems.  Patient seen chart reviewed.  Patient says he is "still feeling down".  He says he still feels down and negative and somewhat hopeless about his situation but he denies any suicidal thoughts denies any homicidal thoughts and denies any psychosis.  He complains that he still had trouble sleeping last night although the chart documents 6-3/4 hours of sleep.  Patient's blood sugars are under even worse control. Principal Problem: MDD (major depressive disorder) Diagnosis: Principal Problem:   MDD (major depressive disorder) Active Problems:   Diabetes ([place])   HTN (hypertension)   Cocaine abuse ([place])   Below-knee amputation of left lower extremity (HCC)   PAD (peripheral artery disease) ([place])  Total Time spent with patient: 45 minutes  Past Psychiatric History: Past history of substance abuse primarily  Past Medical History:  Past Medical History:  Diagnosis Date  . Atrial fibrillation ([place])    per patient  . Cancer ([place])   . CHF (congestive heart failure) ([place])   . Chronic back pain   . Chronic leg pain   . Coronary artery disease   . Diabetes mellitus without complication ([place])   . Hypertension   . Neuropathy     Past Surgical History:  Procedure Laterality Date  . AMPUTATION Left 03/13/2019   Procedure: AMPUTATION BELOW KNEE;  Surgeon: Algernon Huxley, MD;  Location: [facility] ORS;  Service: Vascular;  Laterality: Left;  . CORONARY ANGIOPLASTY WITH STENT PLACEMENT    . EMBOLECTOMY Left 03/12/2019   Procedure: EMBOLECTOMY SFA POPLITEAL;  Surgeon: Algernon Huxley, MD;  Location: [facility] ORS;  Service: Vascular;  Laterality: Left;  . ENDARTERECTOMY FEMORAL Left 03/12/2019   Procedure: ENDARTERECTOMY FEMORAL;  Surgeon: Algernon Huxley, MD;  Location: [facility] ORS;  Service: Vascular;  Laterality: Left;  .  ESOPHAGOGASTRODUODENOSCOPY N/A 09/11/2017   Procedure: ESOPHAGOGASTRODUODENOSCOPY (EGD);  Surgeon: Virgel Manifold, MD;  Location: [facility] ENDOSCOPY;  Service: Endoscopy;  Laterality: N/A;  . LOWER EXTREMITY ANGIOGRAPHY Left 11/17/2018   Procedure: Lower Extremity Angiography, with possible intervention;  Surgeon: Algernon Huxley, MD;  Location: [place] [facility];  Service: Cardiovascular;  Laterality: Left;  . LOWER EXTREMITY ANGIOGRAPHY Left 03/04/2019   Procedure: Lower Extremity Angiography;  Surgeon: Algernon Huxley, MD;  Location: [place] [facility];  Service: Cardiovascular;  Laterality: Left;  . LOWER EXTREMITY ANGIOGRAPHY Left 07/01/2019   Procedure: LOWER EXTREMITY ANGIOGRAPHY;  Surgeon: Algernon Huxley, MD;  Location: [place] [facility];  Service: Cardiovascular;  Laterality: Left;  . pancreas removed     Family History:  Family History  Problem Relation Age of Onset  . CAD Mother   . Diabetes Mellitus II Mother   . Diabetes Mellitus II Father    Family Psychiatric  History: See previous Social History:  Social History   Substance and Sexual Activity  Alcohol Use No  . Alcohol/week: 0.0 standard drinks     Social History   Substance and Sexual Activity  Drug Use Yes  . Types: Cocaine, Benzodiazepines   Comment: Has used in the past-heroin. Cocaine used over a year ago per pt.    Social History   Socioeconomic History  . Marital status: Single    Spouse name: Not on file  . Number of children: Not on file  . Years of education: Not on file  .  Highest education level: Not on file  Occupational History  . Not on file  Tobacco Use  . Smoking status: Current Every Day Smoker    Packs/day: 0.50    Years: 50.00    Pack years: 25.00  . Smokeless tobacco: Never Used  Substance and Sexual Activity  . Alcohol use: No    Alcohol/week: 0.0 standard drinks  . Drug use: Yes    Types: Cocaine, Benzodiazepines    Comment: Has used in the past-heroin. Cocaine used over a  year ago per pt.  . Sexual activity: Not Currently  Other Topics Concern  . Not on file  Social History Narrative  . Not on file   Social Determinants of Health   Financial Resource Strain:   . Difficulty of Paying Living Expenses: Not on file  Food Insecurity:   . Worried About Charity fundraiser in the Last Year: Not on file  . Ran Out of Food in the Last Year: Not on file  Transportation Needs:   . Lack of Transportation (Medical): Not on file  . Lack of Transportation (Non-Medical): Not on file  Physical Activity:   . Days of Exercise per Week: Not on file  . Minutes of Exercise per Session: Not on file  Stress:   . Feeling of Stress : Not on file  Social Connections:   . Frequency of Communication with Friends and Family: Not on file  . Frequency of Social Gatherings with Friends and Family: Not on file  . Attends Religious Services: Not on file  . Active Member of Clubs or Organizations: Not on file  . Attends Archivist Meetings: Not on file  . Marital Status: Not on file   Additional Social History:                         Sleep: Fair  Appetite:  Fair  Current Medications: Current Facility-Administered Medications  Medication Dose Route Frequency Provider Last Rate Last Admin  . alum & mag hydroxide-simeth (MAALOX/MYLANTA) 200-200-20 MG/5ML suspension 30 mL  30 mL Oral Q4H PRN Cristofano, Dorene Ar, MD      . apixaban (ELIQUIS) tablet 5 mg  5 mg Oral Q12H Cristofano, Dorene Ar, MD   5 mg at 11/10/19 0804  . carvedilol (COREG) tablet 6.25 mg  6.25 mg Oral BID WC Talasia Saulter, Madie Reno, MD   6.25 mg at 11/10/19 0804  . escitalopram (LEXAPRO) tablet 5 mg  5 mg Oral Daily Cristofano, Dorene Ar, MD   5 mg at 11/10/19 0804  . famotidine (PEPCID) tablet 20 mg  20 mg Oral BID Cristofano, Dorene Ar, MD   20 mg at 11/10/19 0804  . feeding supplement (ENSURE ENLIVE) (ENSURE ENLIVE) liquid 237 mL  237 mL Oral BID BM Cristofano, Paul A, MD   237 mL at 11/10/19 1053  . folic  acid (FOLVITE) tablet 1 mg  1 mg Oral Daily Cristofano, Dorene Ar, MD   1 mg at 11/10/19 0804  . gabapentin (NEURONTIN) capsule 300 mg  300 mg Oral TID Jabri Blancett T, MD   300 mg at 11/10/19 1152  . hydrOXYzine (ATARAX/VISTARIL) tablet 25 mg  25 mg Oral TID PRN Cristofano, Paul A, MD      . insulin aspart (novoLOG) injection 0-9 Units  0-9 Units Subcutaneous TID AC & HS Cristofano, Dorene Ar, MD   9 Units at 11/10/19 1152  . insulin aspart (novoLOG) injection 6 Units  6 Units Subcutaneous  TID WC Jeffren Dombek, Madie Reno, MD   6 Units at 11/10/19 1152  . insulin glargine (LANTUS) injection 15 Units  15 Units Subcutaneous QHS Damika Harmon, Madie Reno, MD   15 Units at 11/09/19 2117  . magnesium hydroxide (MILK OF MAGNESIA) suspension 30 mL  30 mL Oral Daily PRN Cristofano, Paul A, MD      . mirtazapine (REMERON) tablet 45 mg  45 mg Oral QHS Sadler Teschner T, MD      . multivitamin with minerals tablet 1 tablet  1 tablet Oral Daily Cristofano, Dorene Ar, MD   1 tablet at 11/10/19 0804  . rosuvastatin (CRESTOR) tablet 40 mg  40 mg Oral q1800 Cristofano, Dorene Ar, MD   40 mg at 11/09/19 1748  . temazepam (RESTORIL) capsule 15 mg  15 mg Oral QHS Dessie Tatem, Madie Reno, MD   15 mg at 11/09/19 2116  . thiamine tablet 100 mg  100 mg Oral Daily Cristofano, Dorene Ar, MD   100 mg at 11/10/19 0804  . traZODone (DESYREL) tablet 50 mg  50 mg Oral QHS Abdulmalik Darco, Madie Reno, MD   50 mg at 11/09/19 2116    Lab Results:  Results for orders placed or performed during the hospital encounter of 11/04/19 (from the past 48 hour(s))  Glucose, capillary     Status: Abnormal   Collection Time: 11/08/19  4:27 PM  Result Value Ref Range   Glucose-Capillary 263 (H) 70 - 99 mg/dL   Comment 1 Notify RN   Glucose, capillary     Status: Abnormal   Collection Time: 11/08/19  9:01 PM  Result Value Ref Range   Glucose-Capillary 320 (H) 70 - 99 mg/dL  Glucose, capillary     Status: Abnormal   Collection Time: 11/09/19  7:05 AM  Result Value Ref Range    Glucose-Capillary 388 (H) 70 - 99 mg/dL  Glucose, capillary     Status: Abnormal   Collection Time: 11/09/19 11:43 AM  Result Value Ref Range   Glucose-Capillary 353 (H) 70 - 99 mg/dL  Glucose, capillary     Status: Abnormal   Collection Time: 11/09/19  4:38 PM  Result Value Ref Range   Glucose-Capillary 395 (H) 70 - 99 mg/dL   Comment 1 Notify RN   Glucose, capillary     Status: Abnormal   Collection Time: 11/09/19  8:58 PM  Result Value Ref Range   Glucose-Capillary 268 (H) 70 - 99 mg/dL  CBC     Status: Abnormal   Collection Time: 11/10/19  6:37 AM  Result Value Ref Range   WBC 8.8 4.0 - 10.5 K/uL   RBC 3.32 (L) 4.22 - 5.81 MIL/uL   Hemoglobin 9.4 (L) 13.0 - 17.0 g/dL   HCT 30.1 (L) 39.0 - 52.0 %   MCV 90.7 80.0 - 100.0 fL   MCH 28.3 26.0 - 34.0 pg   MCHC 31.2 30.0 - 36.0 g/dL   RDW 14.1 11.5 - 15.5 %   Platelets 558 (H) 150 - 400 K/uL   nRBC 0.0 0.0 - 0.2 %    Comment: Performed at [facility], [address] [place], [place] [address]  Creatinine, serum     Status: None   Collection Time: 11/10/19  6:37 AM  Result Value Ref Range   Creatinine, Ser 0.78 0.61 - 1.24 mg/dL   GFR calc non Af Amer >60 >60 mL/min   GFR calc Af Amer >60 >60 mL/min    Comment: Performed at [facility], [address] [place], [place] [address]  Glucose, capillary     Status: Abnormal   Collection Time: 11/10/19  7:01 AM  Result Value Ref Range   Glucose-Capillary 258 (H) 70 - 99 mg/dL  Glucose, capillary     Status: Abnormal   Collection Time: 11/10/19 11:33 AM  Result Value Ref Range   Glucose-Capillary 451 (H) 70 - 99 mg/dL    Blood Alcohol level:  Lab Results  Component Value Date   ETH <10 10/29/2019   ETH <10 0000000    Metabolic Disorder Labs: Lab Results  Component Value Date   HGBA1C 14.9 (H) 10/30/2019   MPG 380.93 10/30/2019   MPG >398 02/28/2019   No results found for: PROLACTIN Lab Results  Component Value Date   CHOL 186 08/10/2015    TRIG 91 10/29/2019   HDL 25 (L) 08/10/2015   CHOLHDL 7.4 08/10/2015   VLDL 29 08/10/2015   LDLCALC 132 (H) 08/10/2015    Physical Findings: AIMS:  , ,  ,  ,    CIWA:    COWS:     Musculoskeletal: Strength & Muscle Tone: within normal limits Gait & Station: unsteady Patient leans: N/A  Psychiatric Specialty Exam: Physical Exam  Nursing note and vitals reviewed. Constitutional: He appears well-developed and well-nourished.  HENT:  Head: Normocephalic and atraumatic.  Eyes: Pupils are equal, round, and reactive to light. Conjunctivae are normal.  Cardiovascular: Normal heart sounds.  Respiratory: Effort normal.  GI: Soft.  Musculoskeletal:        General: Normal range of motion.     Cervical back: Normal range of motion.     Comments: Left below the knee amputation with prosthesis in place  Neurological: He is alert.  Skin: Skin is warm and dry.  Psychiatric: Judgment normal. His speech is delayed. He is slowed and withdrawn. Cognition and memory are normal. He exhibits a depressed mood. He expresses no homicidal and no suicidal ideation.    Review of Systems  Constitutional: Negative.   HENT: Negative.   Eyes: Negative.   Respiratory: Negative.   Cardiovascular: Negative.   Gastrointestinal: Negative.   Musculoskeletal: Negative.   Skin: Negative.   Neurological: Negative.   Psychiatric/Behavioral: Positive for dysphoric mood and sleep disturbance. Negative for suicidal ideas.    Blood pressure 126/71, pulse 85, temperature 99 F (37.2 C), temperature source Oral, resp. rate 18, height 5\' 11"  (1.803 m), SpO2 100 %.Body mass index is 23.57 kg/m.  General Appearance: Casual  Eye Contact:  Minimal  Speech:  Slow  Volume:  Decreased  Mood:  Depressed  Affect:  Constricted  Thought Process:  Coherent  Orientation:  Full (Time, Place, and Person)  Thought Content:  Logical  Suicidal Thoughts:  No  Homicidal Thoughts:  No  Memory:  Immediate;   Fair Recent;    Fair Remote;   Fair  Judgement:  Fair  Insight:  Fair  Psychomotor Activity:  Normal  Concentration:  Concentration: Fair  Recall:  [employer] of Knowledge:  Fair  Language:  Fair  Akathisia:  No  Handed:  Right  AIMS (if indicated):     Assets:  Communication Skills Desire for Improvement Resilience  ADL's:  Impaired  Cognition:  WNL  Sleep:  Number of Hours: 6.75     Treatment Plan Summary: Daily contact with patient to assess and evaluate symptoms and progress in treatment, Medication management and Plan Psychiatrically patient continues to complain of depression but he is out of his room  dressing himself taking care of his ADLs interacting with others.  Still looks down and negative much of the time but there is no sign of psychosis and he is not complaining of active suicidal thoughts.  He has tolerated the Remeron.  I will go ahead and increase this to the full dose of 45 mg.  As far as sleep the patient continues to complain of poor sleep.  I would not like to increase his medicine any further given that the documentation suggest that he is sleeping better than he thinks.  Patient's diabetes is not well controlled at all.  We switched him to soft diet because he said he could not chew the regular diet but ever since we have switched his sugars are even higher despite going up on his insulin.  I am going to see if I can contact the hospitalist service for any advice and assistance.  Meanwhile patient has been encouraged to make more serious effort at compromising and finding a place to live.  Alethia Berthold, MD 11/10/2019, 2:20 PM

## 2019-11-10 NOTE — Progress Notes (Signed)
Patient was in his room upon arrival to the unit. Patient was pleasant during assessment denying SI/HI/AVH. Patient endorses depression and anxiety but couldn't rate them this evening. Patient compliant with medication administration per MD orders. Patient observed interacting appropriately with staff and peers on the unit. Patient given education to be active in his treatment plan. Patient being monitored Q 15 minutes for safety per unit protocol. Patient remains safe on the unit.

## 2019-11-10 NOTE — Progress Notes (Signed)
Patient's blood sugar was 451 before lunch. MD was notified and instructed this writer to administer 15 units of Insulin, per orders.

## 2019-11-10 NOTE — Plan of Care (Signed)
Patient compliant with medication administration per MD orders  Problem: Medication: Goal: Compliance with prescribed medication regimen will improve Outcome: Progressing

## 2019-11-10 NOTE — Tx Team (Signed)
Interdisciplinary Treatment and Diagnostic Plan Update  11/10/2019 Time of Session: 9:00AM  Richard [name] MRN: [id number]  Principal Diagnosis: MDD (major depressive disorder)  Secondary Diagnoses: Principal Problem:   MDD (major depressive disorder) Active Problems:   Diabetes ([place])   HTN (hypertension)   Cocaine abuse ([place])   Below-knee amputation of left lower extremity (HCC)   PAD (peripheral artery disease) ([place])   Current Medications:  Current Facility-Administered Medications  Medication Dose Route Frequency Provider Last Rate Last Admin  . alum & mag hydroxide-simeth (MAALOX/MYLANTA) 200-200-20 MG/5ML suspension 30 mL  30 mL Oral Q4H PRN Cristofano, Dorene Ar, MD      . apixaban (ELIQUIS) tablet 5 mg  5 mg Oral Q12H Cristofano, Dorene Ar, MD   5 mg at 11/10/19 0804  . carvedilol (COREG) tablet 6.25 mg  6.25 mg Oral BID WC Clapacs, Madie Reno, MD   6.25 mg at 11/10/19 0804  . escitalopram (LEXAPRO) tablet 5 mg  5 mg Oral Daily Cristofano, Dorene Ar, MD   5 mg at 11/10/19 0804  . famotidine (PEPCID) tablet 20 mg  20 mg Oral BID Cristofano, Dorene Ar, MD   20 mg at 11/10/19 0804  . feeding supplement (ENSURE ENLIVE) (ENSURE ENLIVE) liquid 237 mL  237 mL Oral BID BM Cristofano, Paul A, MD   237 mL at 11/08/19 1500  . folic acid (FOLVITE) tablet 1 mg  1 mg Oral Daily Cristofano, Dorene Ar, MD   1 mg at 11/10/19 0804  . gabapentin (NEURONTIN) capsule 300 mg  300 mg Oral TID Clapacs, Madie Reno, MD   300 mg at 11/10/19 0804  . hydrOXYzine (ATARAX/VISTARIL) tablet 25 mg  25 mg Oral TID PRN Cristofano, Paul A, MD      . insulin aspart (novoLOG) injection 0-9 Units  0-9 Units Subcutaneous TID AC & HS Cristofano, Dorene Ar, MD   5 Units at 11/10/19 0801  . insulin aspart (novoLOG) injection 6 Units  6 Units Subcutaneous TID WC Clapacs, Madie Reno, MD   6 Units at 11/10/19 0801  . insulin glargine (LANTUS) injection 15 Units  15 Units Subcutaneous QHS Clapacs, Madie Reno, MD   15 Units at 11/09/19 2117  . magnesium  hydroxide (MILK OF MAGNESIA) suspension 30 mL  30 mL Oral Daily PRN Cristofano, Paul A, MD      . mirtazapine (REMERON) tablet 30 mg  30 mg Oral QHS Clapacs, John T, MD   30 mg at 11/09/19 2116  . multivitamin with minerals tablet 1 tablet  1 tablet Oral Daily Cristofano, Dorene Ar, MD   1 tablet at 11/10/19 0804  . rosuvastatin (CRESTOR) tablet 40 mg  40 mg Oral q1800 Cristofano, Dorene Ar, MD   40 mg at 11/09/19 1748  . temazepam (RESTORIL) capsule 15 mg  15 mg Oral QHS Clapacs, Madie Reno, MD   15 mg at 11/09/19 2116  . thiamine tablet 100 mg  100 mg Oral Daily Cristofano, Dorene Ar, MD   100 mg at 11/10/19 0804  . traZODone (DESYREL) tablet 50 mg  50 mg Oral QHS Clapacs, Madie Reno, MD   50 mg at 11/09/19 2116   PTA Medications: Medications Prior to Admission  Medication Sig Dispense Refill Last Dose  . ACCU-CHEK AVIVA PLUS test strip U ONCE TO BID UTD 100 each 0   . ACCU-CHEK SOFTCLIX LANCETS lancets TEST 1-2 XD 100 each 0   . acetaminophen (TYLENOL) 325 MG tablet Take 1 tablet (325 mg total) by mouth every 6 (six)  hours as needed for mild pain (or Fever >/= 101). 30 tablet 0   . apixaban (ELIQUIS) 5 MG TABS tablet Take 1 tablet (5 mg total) by mouth 2 (two) times daily. (Patient not taking: Reported on 10/29/2019) 60 tablet 0   . Blood Glucose Monitoring Suppl (ACCU-CHEK AVIVA PLUS) w/Device KIT U TO TEST ONCE TO BID 1 kit 1   . carvedilol (COREG) 3.125 MG tablet Take 1 tablet (3.125 mg total) by mouth 2 (two) times daily with a meal. (Patient not taking: Reported on 10/29/2019)     . Ensure Max Protein (ENSURE MAX PROTEIN) LIQD Take 330 mLs (11 oz total) by mouth 2 (two) times daily between meals. (Patient not taking: Reported on 10/29/2019)     . gabapentin (NEURONTIN) 100 MG capsule Take 1 capsule (100 mg total) by mouth 3 (three) times daily. (Patient taking differently: Take 100 mg by mouth daily as needed. ) 180 capsule 3   . insulin aspart (NOVOLOG) 100 UNIT/ML injection Inject 0-5 Units into the skin at  bedtime. CBG < 70: implement hypoglycemia protocol CBG 70 - 120: 0 units CBG 121 - 150: 0 units CBG 151 - 200: 0 units CBG 201 - 250: 2 units CBG 251 - 300: 3 units CBG 301 - 350: 4 units CBG 351 - 400: 5 units CBG > 400: call MD 10 mL 11   . insulin aspart (NOVOLOG) 100 UNIT/ML injection Inject 10 Units into the skin 3 (three) times daily with meals. 10 mL 11   . insulin glargine (LANTUS) 100 UNIT/ML injection Inject 0.07 mLs (7 Units total) into the skin daily. 10 mL 11   . Multiple Vitamin (MULTIVITAMIN WITH MINERALS) TABS tablet Take 1 tablet by mouth daily. 30 tablet 1   . Needles & Syringes MISC Use as directed (Patient not taking: Reported on 10/29/2019) 100 each 3   . pantoprazole (PROTONIX) 40 MG tablet Take 1 tablet (40 mg total) by mouth daily. (Patient not taking: Reported on 10/29/2019) 30 tablet 0   . rosuvastatin (CRESTOR) 40 MG tablet Take 1 tablet (40 mg total) by mouth daily at 6 PM. (Patient not taking: Reported on 10/29/2019)     . thiamine 100 MG tablet Take 1 tablet (100 mg total) by mouth daily. 30 tablet 0   . ticagrelor (BRILINTA) 90 MG TABS tablet Take 1 tablet (90 mg total) by mouth 2 (two) times daily. (Patient not taking: Reported on 10/29/2019) 60 tablet      Patient Stressors: Financial difficulties Marital or family conflict Substance abuse  Patient Strengths: Ability for insight Active sense of humor Average or above average intelligence Communication skills  Treatment Modalities: Medication Management, Group therapy, Case management,  1 to 1 session with clinician, Psychoeducation, Recreational therapy.   Physician Treatment Plan for Primary Diagnosis: MDD (major depressive disorder) Long Term Goal(s): Improvement in symptoms so as ready for discharge Improvement in symptoms so as ready for discharge   Short Term Goals: Ability to verbalize feelings will improve Ability to disclose and discuss suicidal ideas Ability to demonstrate self-control will  improve Ability to identify and develop effective coping behaviors will improve Ability to maintain clinical measurements within normal limits will improve Compliance with prescribed medications will improve  Medication Management: Evaluate patient's response, side effects, and tolerance of medication regimen.  Therapeutic Interventions: 1 to 1 sessions, Unit Group sessions and Medication administration.  Evaluation of Outcomes: Progressing  Physician Treatment Plan for Secondary Diagnosis: Principal Problem:   MDD (major depressive  disorder) Active Problems:   Diabetes ([place])   HTN (hypertension)   Cocaine abuse ([place])   Below-knee amputation of left lower extremity (HCC)   PAD (peripheral artery disease) ([place])  Long Term Goal(s): Improvement in symptoms so as ready for discharge Improvement in symptoms so as ready for discharge   Short Term Goals: Ability to verbalize feelings will improve Ability to disclose and discuss suicidal ideas Ability to demonstrate self-control will improve Ability to identify and develop effective coping behaviors will improve Ability to maintain clinical measurements within normal limits will improve Compliance with prescribed medications will improve     Medication Management: Evaluate patient's response, side effects, and tolerance of medication regimen.  Therapeutic Interventions: 1 to 1 sessions, Unit Group sessions and Medication administration.  Evaluation of Outcomes: Progressing   RN Treatment Plan for Primary Diagnosis: MDD (major depressive disorder) Long Term Goal(s): Knowledge of disease and therapeutic regimen to maintain health will improve  Short Term Goals: Ability to remain free from injury will improve, Ability to demonstrate self-control, Ability to participate in decision making will improve, Ability to verbalize feelings will improve, Ability to disclose and discuss suicidal ideas and Ability to identify and develop effective  coping behaviors will improve  Medication Management: RN will administer medications as ordered by provider, will assess and evaluate patient's response and provide education to patient for prescribed medication. RN will report any adverse and/or side effects to prescribing provider.  Therapeutic Interventions: 1 on 1 counseling sessions, Psychoeducation, Medication administration, Evaluate responses to treatment, Monitor vital signs and CBGs as ordered, Perform/monitor CIWA, COWS, AIMS and Fall Risk screenings as ordered, Perform wound care treatments as ordered.  Evaluation of Outcomes: Progressing   LCSW Treatment Plan for Primary Diagnosis: MDD (major depressive disorder) Long Term Goal(s): Safe transition to appropriate next level of care at discharge, Engage patient in therapeutic group addressing interpersonal concerns.  Short Term Goals: Engage patient in aftercare planning with referrals and resources, Increase social support, Increase ability to appropriately verbalize feelings, Facilitate patient progression through stages of change regarding substance use diagnoses and concerns and Identify triggers associated with mental health/substance abuse issues  Therapeutic Interventions: Assess for all discharge needs, 1 to 1 time with Social worker, Explore available resources and support systems, Assess for adequacy in community support network, Educate family and significant other(s) on suicide prevention, Complete Psychosocial Assessment, Interpersonal group therapy.  Evaluation of Outcomes: Progressing   Progress in Treatment: Attending groups: Yes. Participating in groups: No. Taking medication as prescribed: Yes. Toleration medication: Yes. Family/Significant other contact made: Yes, individual(s) contacted:  pts ex, Andreas Newport Patient understands diagnosis: Yes. Discussing patient identified problems/goals with staff: Yes. Medical problems stabilized or resolved: Yes. Denies  suicidal/homicidal ideation: Yes. Issues/concerns per patient self-inventory: No. Other: none  New problem(s) identified: No, Describe:  none  New Short Term/Long Term Goal(s): detox, medication management for mood stabilization; elimination of SI thoughts; development of comprehensive mental wellness/sobriety plan.  Patient Goals:  "working on getting myself back together and leave them drugs alone".  Discharge Plan or Barriers: Patient reports plans to either return to his home with his girlfriend.  If he can not return there he plans to go to [place] with his daughter. Patient has plans to follow up with [facility] if he remains in the area. Update 11/10/19: Pt still unsure of where he will be going at discharge. At this time pt has not provided contact information for additional family members and states he is unsure.  Reason  for Continuation of Hospitalization: Anxiety Depression Medication stabilization Suicidal ideation  Estimated Length of Stay: 1-7 days  Attendees: Patient:  11/10/2019 9:57 AM  Physician: Dr. Weber Cooks, MD 11/10/2019 9:57 AM  Nursing:  11/10/2019 9:57 AM  RN Care Manager: 11/10/2019 9:57 AM  Social Worker: Minette Brine Daimien Patmon LCSW 11/10/2019 9:57 AM  Recreational Therapist:  11/10/2019 9:57 AM  Other:  11/10/2019 9:57 AM  Other:  11/10/2019 9:57 AM  Other: 11/10/2019 9:57 AM    Scribe for Treatment Team: Mariann Laster Larayah Clute, LCSW 11/10/2019 9:57 AM

## 2019-11-11 LAB — GLUCOSE, CAPILLARY
Glucose-Capillary: 237 mg/dL — ABNORMAL HIGH (ref 70–99)
Glucose-Capillary: 286 mg/dL — ABNORMAL HIGH (ref 70–99)
Glucose-Capillary: 195 mg/dL — ABNORMAL HIGH (ref 70–99)
Glucose-Capillary: 212 mg/dL — ABNORMAL HIGH (ref 70–99)

## 2019-11-11 MED ORDER — INSULIN GLARGINE 100 UNIT/ML ~~LOC~~ SOLN
20.0000 [IU] | Freq: Every day | SUBCUTANEOUS | Status: DC
  Administered 2019-11-11 – 2019-11-12 (×2): 20 [IU] via SUBCUTANEOUS
  Filled 2019-11-11 (×3): qty 0.2

## 2019-11-11 MED ORDER — IBUPROFEN 600 MG PO TABS
600.0000 mg | ORAL_TABLET | Freq: Four times a day (QID) | ORAL | Status: DC | PRN
  Administered 2019-11-11: 600 mg via ORAL
  Filled 2019-11-11: qty 1

## 2019-11-11 NOTE — NC FL2 (Signed)
[place] LEVEL OF CARE SCREENING TOOL     IDENTIFICATION  Patient Name: Richard [name] Birthdate: 1955/12/01 Sex: male Admission Date (Current Location): 11/04/2019  [facility] [place] and [place] Number:  Engineering geologist and Address:  [facility], [address], [place], [place] [address]      Provider Number: [id number]  Attending Physician Name and Address:  Clapacs, Madie Reno, MD  Relative Name and Phone Number:       Current Level of Care: Hospital Recommended Level of Care: [place], Assisted Living Facility(Group Home) Prior Approval Number:    Date Approved/Denied:   PASRR Number:    Discharge Plan: Other (Comment)(Group Home)    Current Diagnoses: Patient Active Problem List   Diagnosis Date Noted  . MDD (major depressive disorder) 11/04/2019  . Major depressive disorder, recurrent episode, moderate ([place]) 11/01/2019  . Acute respiratory failure ([place]) 10/29/2019  . PAD (peripheral artery disease) ([place]) 04/28/2019  . Below-knee amputation of left lower extremity ([place]) 04/20/2019  . Cocaine abuse ([place])   . Pressure injury of skin 03/12/2019  . Respiratory failure ([place]) 02/28/2019  . Diabetes with hyperosmolar coma ([place]) 11/11/2018  . A-fib ([place]) 09/20/2018  . Hyperglycemia 08/03/2018  . Protein-calorie malnutrition, severe 01/10/2018  . Sepsis ([place]) 01/09/2018  . DKA, type 2 ([place]) 09/05/2017  . Ileus ([place]) 08/24/2017  . Diabetes ([place]) 08/24/2017  . Atrial flutter ([place]) 08/24/2017  . CAD (coronary artery disease) 08/24/2017  . HTN (hypertension) 08/24/2017  . Atrial fibrillation ([place]) 08/24/2017  . Wound dehiscence, surgical, initial encounter 06/24/2017  . Acute hematogenous osteomyelitis of right foot ([place]) 06/20/2017  . Adenocarcinoma of pancreas ([place]) 06/20/2017  . Esophagitis 06/20/2017  . Cellulitis of right lower extremity 06/10/2017  . Hypertension, poor control 06/10/2017  . Insulin dependent type 2 diabetes  mellitus ([place]) 06/10/2017  . Chest pain 08/10/2015    Orientation RESPIRATION BLADDER Height & Weight     Self, Time, Situation, Place  Normal Continent Weight:   Height:  5\' 11"  (180.3 cm)  BEHAVIORAL SYMPTOMS/MOOD NEUROLOGICAL BOWEL NUTRITION STATUS      Continent Diet(Soft)  AMBULATORY STATUS COMMUNICATION OF NEEDS Skin   Limited Assist(Patient left leg is amputated below the knee.  Patient has a prosthetic leg and is able to walk with it.) Verbally Normal                       Personal Care Assistance Level of Assistance  Bathing, Dressing Bathing Assistance: Limited assistance   Dressing Assistance: Limited assistance     Functional Limitations Info             SPECIAL CARE FACTORS FREQUENCY  Blood pressure(Diabetes)                    Contractures Contractures Info: Not present    Additional Factors Info  Code Status Code Status Info: Full             Current Medications (11/11/2019):  This is the current hospital active medication list Current Facility-Administered Medications  Medication Dose Route Frequency Provider Last Rate Last Admin  . alum & mag hydroxide-simeth (MAALOX/MYLANTA) 200-200-20 MG/5ML suspension 30 mL  30 mL Oral Q4H PRN Cristofano, Dorene Ar, MD   30 mL at 11/10/19 1608  . apixaban (ELIQUIS) tablet 5 mg  5 mg Oral Q12H Cristofano, Dorene Ar, MD   5 mg at 11/11/19 0751  . carvedilol (COREG) tablet 6.25 mg  6.25  mg Oral BID WC Clapacs, Madie Reno, MD   6.25 mg at 11/11/19 0752  . escitalopram (LEXAPRO) tablet 5 mg  5 mg Oral Daily Cristofano, Dorene Ar, MD   5 mg at 11/11/19 0752  . famotidine (PEPCID) tablet 20 mg  20 mg Oral BID Cristofano, Dorene Ar, MD   20 mg at 11/11/19 0751  . feeding supplement (GLUCERNA SHAKE) (GLUCERNA SHAKE) liquid 237 mL  237 mL Oral BID BM Lavina Hamman, MD   237 mL at 11/11/19 0946  . folic acid (FOLVITE) tablet 1 mg  1 mg Oral Daily Cristofano, Dorene Ar, MD   1 mg at 11/11/19 0752  . gabapentin (NEURONTIN)  capsule 300 mg  300 mg Oral TID Clapacs, John T, MD   300 mg at 11/11/19 [id number]  . hydrOXYzine (ATARAX/VISTARIL) tablet 25 mg  25 mg Oral TID PRN Cristofano, Paul A, MD      . insulin aspart (novoLOG) injection 0-15 Units  0-15 Units Subcutaneous TID WC Lavina Hamman, MD   5 Units at 11/11/19 0755  . insulin aspart (novoLOG) injection 0-5 Units  0-5 Units Subcutaneous QHS Lavina Hamman, MD   3 Units at 11/10/19 2210  . insulin aspart (novoLOG) injection 10 Units  10 Units Subcutaneous TID WC Lavina Hamman, MD   10 Units at 11/11/19 0756  . insulin glargine (LANTUS) injection 15 Units  15 Units Subcutaneous QHS Lavina Hamman, MD   15 Units at 11/10/19 2210  . magnesium hydroxide (MILK OF MAGNESIA) suspension 30 mL  30 mL Oral Daily PRN Cristofano, Paul A, MD      . mirtazapine (REMERON) tablet 45 mg  45 mg Oral QHS Clapacs, Madie Reno, MD   45 mg at 11/10/19 2209  . multivitamin with minerals tablet 1 tablet  1 tablet Oral Daily Cristofano, Dorene Ar, MD   1 tablet at 11/11/19 0751  . rosuvastatin (CRESTOR) tablet 40 mg  40 mg Oral q1800 Cristofano, Dorene Ar, MD   40 mg at 11/10/19 1709  . temazepam (RESTORIL) capsule 15 mg  15 mg Oral QHS Clapacs, John T, MD   15 mg at 11/10/19 2210  . thiamine tablet 100 mg  100 mg Oral Daily Cristofano, Dorene Ar, MD   100 mg at 11/11/19 0752  . traZODone (DESYREL) tablet 50 mg  50 mg Oral QHS Clapacs, Madie Reno, MD   50 mg at 11/10/19 2210     Discharge Medications: Please see discharge summary for a list of discharge medications.  Relevant Imaging Results:  Relevant Lab Results:   Additional Information    Rozann Lesches, LCSW

## 2019-11-11 NOTE — BHH Counselor (Signed)
CSW attempted to call the patient's sister Bridget Hartshorn at [phone].  Sister reports that she lives in [place], [place]. She reports patient can not stay with her because she has a family and no room.  She reports that patient should be able to return to the home with [facility].  She reports that she does not know of any other family/friends the patient may be able to stay with.  Assunta Curtis, MSW, LCSW 11/11/2019 9:34 AM

## 2019-11-11 NOTE — Progress Notes (Signed)
PROGRESS NOTE    Richard [name]  [id number] DOB: [birth year] DOA: 11/04/2019 PCP: Patient, No Pcp Per   Brief Narrative:  Richard [name] is a 64 y.o. male with Past medical history of A. fib, CHF, CAD, substance abuse, type II DM. Patient is coming from Home Patient was initially admitted on 10/29/2019 after being found on the ground confused and altered.  He was intubated in the ER for airway protection.  Admitted to the ICU.  Patient was extubated and transferred to floor. After patient was able to tolerate oral diet patient was medically cleared and transferred to behavioral health for ongoing suicidal and homicidal ideation. We were consulted to manage his diabetes.  Subjective: Patient was lying down comfortably when seen today.  He had a fall today while sitting on wheelchair which slipped and he fell on ground with no significant injuries.  Assessment & Plan:   Principal Problem:   MDD (major depressive disorder) Active Problems:   Diabetes ([place])   HTN (hypertension)   Cocaine abuse ([place])   Below-knee amputation of left lower extremity (HCC)   PAD (peripheral artery disease) (HCC)  Type 2 diabetes mellitus, uncontrolled with hyperglycemia. Recently admitted for DKA.  A1c of 14.9.  CBG in 200-3 100s Some improvement after increasing the dose of Lantus yesterday. -Crease Lantus to 20 units at bedtime. -Continue mealtime coverage with 10 units. -Continue moderate sliding scale. -We will continue to follow.  Hypertension.  Blood pressure within goal -Continue home regimen.  Paroxysmal A. fib. Continue home regimen. Continue Eliquis.   Severe protein calorie malnutrition. Continuing nutritional supplements.  Suicidal ideation. Severe depression. Polysubstance abuse. Management per psychiatry.   Objective: Vitals:   11/11/19 1058 11/11/19 1259 11/11/19 1513 11/11/19 1738  BP: 134/70 127/68 132/72 140/76  Pulse: 90 84 85 90  Resp:      Temp: 98 F (36.7 C)   98.3 F (36.8 C) 99.1 F (37.3 C)  TempSrc: Oral  Oral Oral  SpO2: 100%  100% 100%  Height:       No intake or output data in the 24 hours ending 11/11/19 1906 There were no vitals filed for this visit.  Examination:  General exam: Appears calm and comfortable  Respiratory system: Clear to auscultation. Respiratory effort normal. Cardiovascular system: S1 & S2 heard, RRR. No JVD, murmurs, rubs, gallops or clicks. Gastrointestinal system: Soft, nontender, nondistended, bowel sounds positive. Central nervous system: Alert and oriented. No focal neurological deficits.Symmetric 5 x 5 power. Extremities: No edema, no cyanosis, pulses intact and symmetrical. Skin: No rashes, lesions or ulcers Psychiatry: Judgement and insight appear normal. Mood & affect appropriate.    DVT prophylaxis: Eliquis Code Status: Full Family Communication: No family at bedside Disposition Plan: Pending improvement.  Admitted on behavioral health service.  Consultants:   [facility]  Procedures:  Antimicrobials:   Data Reviewed: I have personally reviewed following labs and imaging studies  CBC: Recent Labs  Lab 11/07/19 0736 11/10/19 0637  WBC 9.4 8.8  HGB 9.9* 9.4*  HCT 31.8* 30.1*  MCV 90.9 90.7  PLT 548* [id number]*   Basic Metabolic Panel: Recent Labs  Lab 11/06/19 2134 11/07/19 0736 11/10/19 0637  GLUCOSE 469*  --   --   CREATININE  --  0.89 0.78   GFR: Estimated Creatinine Clearance: 99.4 mL/min (by C-G formula based on SCr of 0.78 mg/dL). Liver Function Tests: No results for input(s): AST, ALT, ALKPHOS, BILITOT, PROT, ALBUMIN in the last 168 hours. No results for input(s): LIPASE, AMYLASE in  the last 168 hours. No results for input(s): AMMONIA in the last 168 hours. Coagulation Profile: No results for input(s): INR, PROTIME in the last 168 hours. Cardiac Enzymes: No results for input(s): CKTOTAL, CKMB, CKMBINDEX, TROPONINI in the last 168 hours. BNP (last 3 results) No results for  input(s): PROBNP in the last 8760 hours. HbA1C: No results for input(s): HGBA1C in the last 72 hours. CBG: Recent Labs  Lab 11/10/19 1611 11/10/19 2104 11/11/19 0657 11/11/19 1112 11/11/19 1616  GLUCAP 374* 258* 237* 286* 195*   Lipid Profile: No results for input(s): CHOL, HDL, LDLCALC, TRIG, CHOLHDL, LDLDIRECT in the last 72 hours. Thyroid Function Tests: No results for input(s): TSH, T4TOTAL, FREET4, T3FREE, THYROIDAB in the last 72 hours. Anemia Panel: No results for input(s): VITAMINB12, FOLATE, FERRITIN, TIBC, IRON, RETICCTPCT in the last 72 hours. Sepsis Labs: No results for input(s): PROCALCITON, LATICACIDVEN in the last 168 hours.  No results found for this or any previous visit (from the past 240 hour(s)).   Radiology Studies: No results found.  Scheduled Meds: . apixaban  5 mg Oral Q12H  . carvedilol  6.25 mg Oral BID WC  . escitalopram  5 mg Oral Daily  . famotidine  20 mg Oral BID  . feeding supplement (GLUCERNA SHAKE)  237 mL Oral BID BM  . folic acid  1 mg Oral Daily  . gabapentin  300 mg Oral TID  . insulin aspart  0-15 Units Subcutaneous TID WC  . insulin aspart  0-5 Units Subcutaneous QHS  . insulin aspart  10 Units Subcutaneous TID WC  . insulin glargine  15 Units Subcutaneous QHS  . mirtazapine  45 mg Oral QHS  . multivitamin with minerals  1 tablet Oral Daily  . rosuvastatin  40 mg Oral q1800  . temazepam  15 mg Oral QHS  . thiamine  100 mg Oral Daily  . traZODone  50 mg Oral QHS   Continuous Infusions:   LOS: 7 days   Time spent: 30 minutes  Lorella Nimrod, MD [facility] Pager [phone]  If 7PM-7AM, please contact night-coverage [email or web address] Password [facility] 11/11/2019, 7:06 PM   This record has been created using Systems analyst. Errors have been sought and corrected,but may not always be located. Such creation errors do not reflect on the standard of care.

## 2019-11-11 NOTE — [facility] Group Notes (Signed)
Emotional Regulation 11/11/2019 1PM  Type of Therapy/Topic:  Group Therapy:  Emotion Regulation  Participation Level:  Did Not Attend   Description of Group:   The purpose of this group is to assist patients in learning to regulate negative emotions and experience positive emotions. Patients will be guided to discuss ways in which they have been vulnerable to their negative emotions. These vulnerabilities will be juxtaposed with experiences of positive emotions or situations, and patients will be challenged to use positive emotions to combat negative ones. Special emphasis will be placed on coping with negative emotions in conflict situations, and patients will process healthy conflict resolution skills.  Therapeutic Goals: 1. Patient will identify two positive emotions or experiences to reflect on in order to balance out negative emotions 2. Patient will label two or more emotions that they find the most difficult to experience 3. Patient will demonstrate positive conflict resolution skills through discussion and/or role plays  Summary of Patient Progress:       Therapeutic Modalities:   Cognitive Behavioral Therapy Feelings Identification Dialectical Behavioral Therapy   Yvette Rack, LCSW 11/11/2019 1:45 PM

## 2019-11-11 NOTE — Progress Notes (Signed)
Recreation Therapy Notes  Date: 11/11/2019  Time: 9:30 am   Location: Craft room   Behavioral response: N/A   Intervention Topic: Necessities   Discussion/Intervention: Patient did not attend group.   Clinical Observations/Feedback:  Patient did not attend group.   Mervil Wacker LRT/CTRS        Joetta Delprado 11/11/2019 10:59 AM

## 2019-11-11 NOTE — Progress Notes (Signed)
[facility] MD Progress Note  11/11/2019 3:00 PM Randy Whitus  MRN:  [id number] Subjective: Follow-up for this 64 year old man with depression and substance abuse.  Patient reports that he still feels down and negative but he denies any suicidal thoughts.  He gets up and goes to meals and is able to ambulate.  Sugars are a little better today.  Thanks very much to the medicine service.  Otherwise patient tolerating medication.  Sleep problem seems to get better.  He has told social work that he would be agreeable to looking into a group home Principal Problem: MDD (major depressive disorder) Diagnosis: Principal Problem:   MDD (major depressive disorder) Active Problems:   Diabetes ([place])   HTN (hypertension)   Cocaine abuse ([place])   Below-knee amputation of left lower extremity ([place])   PAD (peripheral artery disease) ([place])  Total Time spent with patient: 30 minutes  Past Psychiatric History: Past history of substance abuse and depression  Past Medical History:  Past Medical History:  Diagnosis Date  . Atrial fibrillation ([place])    per patient  . Cancer ([place])   . CHF (congestive heart failure) ([place])   . Chronic back pain   . Chronic leg pain   . Coronary artery disease   . Diabetes mellitus without complication ([place])   . Hypertension   . Neuropathy     Past Surgical History:  Procedure Laterality Date  . AMPUTATION Left 03/13/2019   Procedure: AMPUTATION BELOW KNEE;  Surgeon: Algernon Huxley, MD;  Location: [facility] ORS;  Service: Vascular;  Laterality: Left;  . CORONARY ANGIOPLASTY WITH STENT PLACEMENT    . EMBOLECTOMY Left 03/12/2019   Procedure: EMBOLECTOMY SFA POPLITEAL;  Surgeon: Algernon Huxley, MD;  Location: [facility] ORS;  Service: Vascular;  Laterality: Left;  . ENDARTERECTOMY FEMORAL Left 03/12/2019   Procedure: ENDARTERECTOMY FEMORAL;  Surgeon: Algernon Huxley, MD;  Location: [facility] ORS;  Service: Vascular;  Laterality: Left;  . ESOPHAGOGASTRODUODENOSCOPY N/A 09/11/2017   Procedure:  ESOPHAGOGASTRODUODENOSCOPY (EGD);  Surgeon: Virgel Manifold, MD;  Location: [facility] ENDOSCOPY;  Service: Endoscopy;  Laterality: N/A;  . LOWER EXTREMITY ANGIOGRAPHY Left 11/17/2018   Procedure: Lower Extremity Angiography, with possible intervention;  Surgeon: Algernon Huxley, MD;  Location: [facility] CV LAB;  Service: Cardiovascular;  Laterality: Left;  . LOWER EXTREMITY ANGIOGRAPHY Left 03/04/2019   Procedure: Lower Extremity Angiography;  Surgeon: Algernon Huxley, MD;  Location: [facility] CV LAB;  Service: Cardiovascular;  Laterality: Left;  . LOWER EXTREMITY ANGIOGRAPHY Left 07/01/2019   Procedure: LOWER EXTREMITY ANGIOGRAPHY;  Surgeon: Algernon Huxley, MD;  Location: [facility] CV LAB;  Service: Cardiovascular;  Laterality: Left;  . pancreas removed     Family History:  Family History  Problem Relation Age of Onset  . CAD Mother   . Diabetes Mellitus II Mother   . Diabetes Mellitus II Father    Family Psychiatric  History: See previous Social History:  Social History   Substance and Sexual Activity  Alcohol Use No  . Alcohol/week: 0.0 standard drinks     Social History   Substance and Sexual Activity  Drug Use Yes  . Types: Cocaine, Benzodiazepines   Comment: Has used in the past-heroin. Cocaine used over a year ago per pt.    Social History   Socioeconomic History  . Marital status: Single    Spouse name: Not on file  . Number of children: Not on file  . Years of education: Not on file  . Highest education  level: Not on file  Occupational History  . Not on file  Tobacco Use  . Smoking status: Current Every Day Smoker    Packs/day: 0.50    Years: 50.00    Pack years: 25.00  . Smokeless tobacco: Never Used  Substance and Sexual Activity  . Alcohol use: No    Alcohol/week: 0.0 standard drinks  . Drug use: Yes    Types: Cocaine, Benzodiazepines    Comment: Has used in the past-heroin. Cocaine used over a year ago per pt.  . Sexual activity: Not Currently  Other  Topics Concern  . Not on file  Social History Narrative  . Not on file   Social Determinants of Health   Financial Resource Strain:   . Difficulty of Paying Living Expenses: Not on file  Food Insecurity:   . Worried About Charity fundraiser in the Last Year: Not on file  . Ran Out of Food in the Last Year: Not on file  Transportation Needs:   . Lack of Transportation (Medical): Not on file  . Lack of Transportation (Non-Medical): Not on file  Physical Activity:   . Days of Exercise per Week: Not on file  . Minutes of Exercise per Session: Not on file  Stress:   . Feeling of Stress : Not on file  Social Connections:   . Frequency of Communication with Friends and Family: Not on file  . Frequency of Social Gatherings with Friends and Family: Not on file  . Attends Religious Services: Not on file  . Active Member of Clubs or Organizations: Not on file  . Attends Archivist Meetings: Not on file  . Marital Status: Not on file   Additional Social History:                         Sleep: Fair  Appetite:  Fair  Current Medications: Current Facility-Administered Medications  Medication Dose Route Frequency Provider Last Rate Last Admin  . alum & mag hydroxide-simeth (MAALOX/MYLANTA) 200-200-20 MG/5ML suspension 30 mL  30 mL Oral Q4H PRN Cristofano, Dorene Ar, MD   30 mL at 11/10/19 1608  . apixaban (ELIQUIS) tablet 5 mg  5 mg Oral Q12H Cristofano, Dorene Ar, MD   5 mg at 11/11/19 0751  . carvedilol (COREG) tablet 6.25 mg  6.25 mg Oral BID WC Delfin Squillace, Madie Reno, MD   6.25 mg at 11/11/19 0752  . escitalopram (LEXAPRO) tablet 5 mg  5 mg Oral Daily Cristofano, Dorene Ar, MD   5 mg at 11/11/19 0752  . famotidine (PEPCID) tablet 20 mg  20 mg Oral BID Cristofano, Dorene Ar, MD   20 mg at 11/11/19 0751  . feeding supplement (GLUCERNA SHAKE) (GLUCERNA SHAKE) liquid 237 mL  237 mL Oral BID BM Lavina Hamman, MD   237 mL at 11/11/19 0946  . folic acid (FOLVITE) tablet 1 mg  1 mg Oral  Daily Cristofano, Dorene Ar, MD   1 mg at 11/11/19 0752  . gabapentin (NEURONTIN) capsule 300 mg  300 mg Oral TID Jarryd Gratz, Madie Reno, MD   300 mg at 11/11/19 1205  . hydrOXYzine (ATARAX/VISTARIL) tablet 25 mg  25 mg Oral TID PRN Cristofano, Paul A, MD      . insulin aspart (novoLOG) injection 0-15 Units  0-15 Units Subcutaneous TID WC Lavina Hamman, MD   8 Units at 11/11/19 1206  . insulin aspart (novoLOG) injection 0-5 Units  0-5 Units Subcutaneous QHS  Lavina Hamman, MD   3 Units at 11/10/19 2210  . insulin aspart (novoLOG) injection 10 Units  10 Units Subcutaneous TID WC Lavina Hamman, MD   10 Units at 11/11/19 1206  . insulin glargine (LANTUS) injection 15 Units  15 Units Subcutaneous QHS Lavina Hamman, MD   15 Units at 11/10/19 2210  . magnesium hydroxide (MILK OF MAGNESIA) suspension 30 mL  30 mL Oral Daily PRN Cristofano, Paul A, MD      . mirtazapine (REMERON) tablet 45 mg  45 mg Oral QHS Daemien Fronczak, Madie Reno, MD   45 mg at 11/10/19 2209  . multivitamin with minerals tablet 1 tablet  1 tablet Oral Daily Cristofano, Dorene Ar, MD   1 tablet at 11/11/19 0751  . rosuvastatin (CRESTOR) tablet 40 mg  40 mg Oral q1800 Cristofano, Dorene Ar, MD   40 mg at 11/10/19 1709  . temazepam (RESTORIL) capsule 15 mg  15 mg Oral QHS Kiira Brach T, MD   15 mg at 11/10/19 2210  . thiamine tablet 100 mg  100 mg Oral Daily Cristofano, Dorene Ar, MD   100 mg at 11/11/19 0752  . traZODone (DESYREL) tablet 50 mg  50 mg Oral QHS Shaunak Kreis, Madie Reno, MD   50 mg at 11/10/19 2210    Lab Results:  Results for orders placed or performed during the hospital encounter of 11/04/19 (from the past 48 hour(s))  Glucose, capillary     Status: Abnormal   Collection Time: 11/09/19  4:38 PM  Result Value Ref Range   Glucose-Capillary 395 (H) 70 - 99 mg/dL   Comment 1 Notify RN   Glucose, capillary     Status: Abnormal   Collection Time: 11/09/19  8:58 PM  Result Value Ref Range   Glucose-Capillary 268 (H) 70 - 99 mg/dL  CBC     Status:  Abnormal   Collection Time: 11/10/19  6:37 AM  Result Value Ref Range   WBC 8.8 4.0 - 10.5 K/uL   RBC 3.32 (L) 4.22 - 5.81 MIL/uL   Hemoglobin 9.4 (L) 13.0 - 17.0 g/dL   HCT 30.1 (L) 39.0 - 52.0 %   MCV 90.7 80.0 - 100.0 fL   MCH 28.3 26.0 - 34.0 pg   MCHC 31.2 30.0 - 36.0 g/dL   RDW 14.1 11.5 - 15.5 %   Platelets 558 (H) 150 - 400 K/uL   nRBC 0.0 0.0 - 0.2 %    Comment: Performed at [facility], [address] [place], [place] [address]  Creatinine, serum     Status: None   Collection Time: 11/10/19  6:37 AM  Result Value Ref Range   Creatinine, Ser 0.78 0.61 - 1.24 mg/dL   GFR calc non Af Amer >60 >60 mL/min   GFR calc Af Amer >60 >60 mL/min    Comment: Performed at [facility], [address] [place], [place] [address]  Glucose, capillary     Status: Abnormal   Collection Time: 11/10/19  7:01 AM  Result Value Ref Range   Glucose-Capillary 258 (H) 70 - 99 mg/dL  Glucose, capillary     Status: Abnormal   Collection Time: 11/10/19 11:33 AM  Result Value Ref Range   Glucose-Capillary 451 (H) 70 - 99 mg/dL  Glucose, capillary     Status: Abnormal   Collection Time: 11/10/19  4:11 PM  Result Value Ref Range   Glucose-Capillary 374 (H) 70 - 99 mg/dL  Glucose, capillary  Status: Abnormal   Collection Time: 11/10/19  9:04 PM  Result Value Ref Range   Glucose-Capillary 258 (H) 70 - 99 mg/dL  Glucose, capillary     Status: Abnormal   Collection Time: 11/11/19  6:57 AM  Result Value Ref Range   Glucose-Capillary 237 (H) 70 - 99 mg/dL  Glucose, capillary     Status: Abnormal   Collection Time: 11/11/19 11:12 AM  Result Value Ref Range   Glucose-Capillary 286 (H) 70 - 99 mg/dL    Blood Alcohol level:  Lab Results  Component Value Date   ETH <10 10/29/2019   ETH <10 0000000    Metabolic Disorder Labs: Lab Results  Component Value Date   HGBA1C 14.9 (H) 10/30/2019   MPG 380.93 10/30/2019   MPG >398 02/28/2019   No results found for:  PROLACTIN Lab Results  Component Value Date   CHOL 186 08/10/2015   TRIG 91 10/29/2019   HDL 25 (L) 08/10/2015   CHOLHDL 7.4 08/10/2015   VLDL 29 08/10/2015   LDLCALC 132 (H) 08/10/2015    Physical Findings: AIMS:  , ,  ,  ,    CIWA:    COWS:     Musculoskeletal: Strength & Muscle Tone: decreased Gait & Station: unsteady Patient leans: N/A  Psychiatric Specialty Exam: Physical Exam  Nursing note and vitals reviewed. Constitutional: He appears well-developed and well-nourished.  HENT:  Head: Normocephalic and atraumatic.  Eyes: Pupils are equal, round, and reactive to light. Conjunctivae are normal.  Cardiovascular: Regular rhythm and normal heart sounds.  Respiratory: Effort normal.  GI: Soft.  Musculoskeletal:        General: Normal range of motion.     Cervical back: Normal range of motion.  Neurological: He is alert.  Skin: Skin is warm and dry.  Psychiatric: Judgment normal. His affect is blunt. His speech is delayed. He is slowed. Thought content is not paranoid. Cognition and memory are normal. He expresses no homicidal and no suicidal ideation.    Review of Systems  Constitutional: Negative.   HENT: Negative.   Eyes: Negative.   Respiratory: Negative.   Cardiovascular: Negative.   Gastrointestinal: Negative.   Musculoskeletal: Negative.   Skin: Negative.   Neurological: Negative.   Psychiatric/Behavioral: Positive for dysphoric mood. Negative for agitation, behavioral problems, confusion, decreased concentration and hallucinations. The patient is not nervous/anxious and is not hyperactive.     Blood pressure 127/68, pulse 84, temperature 98 F (36.7 C), temperature source Oral, resp. rate 17, height 5\' 11"  (1.803 m), SpO2 100 %.Body mass index is 23.57 kg/m.  General Appearance: Casual  Eye Contact:  Fair  Speech:  Slow  Volume:  Decreased  Mood:  Euthymic  Affect:  Congruent  Thought Process:  Goal Directed  Orientation:  Full (Time, Place, and  Person)  Thought Content:  Logical  Suicidal Thoughts:  No  Homicidal Thoughts:  No  Memory:  Immediate;   Fair Recent;   Fair Remote;   Fair  Judgement:  Fair  Insight:  Fair  Psychomotor Activity:  Decreased  Concentration:  Concentration: Fair  Recall:  [employer] of Knowledge:  Fair  Language:  Fair  Akathisia:  No  Handed:  Right  AIMS (if indicated):     Assets:  Desire for Improvement  ADL's:  Impaired  Cognition:  WNL  Sleep:  Number of Hours: 8     Treatment Plan Summary: Daily contact with patient to assess and evaluate symptoms and progress in treatment,  Medication management and Plan Depression gradually improving tolerating current dose of Remeron.  Major life stresses continue of course to affect his mood.  Psychiatrically fairly stable.  Patient's blood sugars still elevated.  I appreciate Dr. Serita Grit assistance very much.  We will continue to keep an eye on those.  No other change to medication.  Encourage patient and his physical activity.  Social work will assist patient if possible on finding a group home or another resource for living  Alethia Berthold, MD 11/11/2019, 3:00 PM

## 2019-11-11 NOTE — Progress Notes (Signed)
D:Patient called the nursing station  Indicating that he has had a accident in his pants  Bowel movement  And has fallen to the floor  Hitting the back of his  Head and his back.  No breaks in skin noted . Staff assisted patient to clean up . Patient stated he started to started to stand  And his wheel chair wasn't locked , and it rolled out from under him . Patient  Richard [name] back to the dayroom and remained   Questioned if he has called his sister today  Referring to placement . Stated he could not reach her .  Stated he will try again later. Patient stated slept good last night .Stated appetite is good and energy level  Is normal. Stated concentration is good . Denies  Depression , hopeless and anxiety . Denies suicidal  homicidal ideations  .  auditory hallucinations  No pain concerns . Appropriate ADL'S. Interacting with peers and staff.  A: Encourage patient participation with unit programming . Instruction  Given on  Medication , verbalize understanding. R: Voice no other concerns. Staff continue to monitor

## 2019-11-11 NOTE — BHH Counselor (Signed)
CSW received a follow up call from Bridget Hartshorn at [phone] and her husband.    CSW had to point out several times that the consent only allows for contact with the patient's sister.  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   Patient's husband and sister wanted to know "what is your plan" in regards to the patient being discharged. Family sounded angry and forceful by tone and disposition when asking the questions.   CSW informed that per review of the notes from PT pt is not appropriate for rehab facility, which was pt's first choice at being discharged and that patient asked for me to contact sister about staying with her, his second choice.  CSW pointed out that per them that was also not an option.  CSW observed the family to begin to argue that CSW has "taken sides" and "taken the side of someone other than the patient".  CSW pointed out that she has not "taken sides" but been informed that patient can not return to his home with his former long-term girlfriend [name] and that per her proper paperwork has been taken out to support that.  CSW pointed out that pt needs to identify alternative plans for his placement if family and friends are no longer a viable option.  Assunta Curtis, MSW, LCSW 11/11/2019 9:53 AM

## 2019-11-11 NOTE — BHH Group Notes (Signed)
[place] Group Notes:  (Nursing/MHT/Case Management/Adjunct)  Date:  11/11/2019  Time:  9:14 PM  Type of Therapy:  Group Therapy  Participation Level:  Active  Participation Quality:  Appropriate  Affect:  Pt. smell bad.  Cognitive:  Alert  Insight:  Good  Engagement in Group:  Engaged  Modes of Intervention:  Support  Summary of Progress/Problems:  Richard [name] 11/11/2019, 9:14 PM

## 2019-11-11 NOTE — Progress Notes (Signed)
Physical Therapy Treatment Patient Details Name: Richard [name] MRN: [id number] DOB: 14-Mar-1956 Today's Date: 11/11/2019    History of Present Illness 44yoM who comes to [facility] on 1/7 after being found on the ground, confused and altered in his house today. Patient's significant other [name] reported that the patient was doing drugs all last night. intubated for airway protection 2/2 emesis. admit the patient to ICU for further work-up and treatment of acute hypoxic respiratory failure in the setting of  aspiration pneumonitis secondary to acute cocaine toxicity with significant metabolic encephalopathy with seizure-like activity.  Patient also noted to be in DKA. Extubated to RA on 1/10. PMH: polysubstance abuse, CHF, hypertension, diabetes mellitus, A. fib    PT Comments    Pt in group session upon arrival, but agreeable to participate. Pt reports sustaining a fall in the BR this morning hitting the back of his head (author noted visibly swollen area left of occiput) and hurting his Right Low Back. Pt reports he asked for pain meds early but has not gotten them as of yet. Pt transfers with relative ease and use of BUE from [facility] with RW. Pt AMB slowly ~96ft, then stops to rest in standing d/t worsening low back pain. Pt eventually needs to sit, defers additional AMB at this time. He requests that author check in with RN regarding availability of pain meds, which Pryor Curia does at end of session. Pt still hopeful to continue to do some [facility] training in future session, disappointed that author did not bring one this date.       Follow Up Recommendations  Outpatient PT     Equipment Recommendations  None recommended by PT    Recommendations for Other Services       Precautions / Restrictions Precautions Precautions: Fall Restrictions RLE Weight Bearing: Weight bearing as tolerated LLE Weight Bearing: Weight bearing as tolerated    Mobility  Bed Mobility               General bed mobility  comments: receoved in [place]  Transfers Overall transfer level: Needs assistance Equipment used: Rolling walker (2 wheeled) Transfers: Sit to/from Stand Sit to Stand: Supervision            Ambulation/Gait Ambulation/Gait assistance: Min guard Gait Distance (Feet): 90 Feet Assistive device: Rolling walker (2 wheeled) Gait Pattern/deviations: Decreased step length - right;Decreased stance time - left;Antalgic     General Gait Details: heavy UE support with near continuous RW push; Pt without shoes, but reports will have no detriment to prosthesis use. Pt stops d/t Low back pain from fall. declines additional AMB, asking for pain meds again.   Stairs             Wheelchair Mobility    Modified Rankin (Stroke Patients Only)       Balance Overall balance assessment: History of Falls;Mild deficits observed, not formally tested;Needs assistance(fell this morning in BR, reports he slipped and that WC was not locked as he thought)                                          Cognition Arousal/Alertness: Awake/alert Behavior During Therapy: WFL for tasks assessed/performed Overall Cognitive Status: Within Functional Limits for tasks assessed  Exercises      General Comments        Pertinent Vitals/Pain Pain Assessment: 0-10 Pain Score: 8  Pain Location: Left occiput, Rt low back (fall this morning in BR) Pain Descriptors / Indicators: Aching Pain Intervention(s): Limited activity within patient's tolerance;Patient requesting pain meds-RN notified    Home Living                      Prior Function            PT Goals (current goals can now be found in the care plan section) Acute Rehab PT Goals Patient Stated Goal: regain strength improve AMB PT Goal Formulation: With patient Time For Goal Achievement: 11/17/19 Potential to Achieve Goals: Good Progress towards PT goals: PT to  reassess next treatment    Frequency    Min 2X/week      PT Plan Current plan remains appropriate    Co-evaluation              AM-PAC PT "6 Clicks" Mobility   Outcome Measure  Help needed turning from your back to your side while in a flat bed without using bedrails?: None Help needed moving from lying on your back to sitting on the side of a flat bed without using bedrails?: None Help needed moving to and from a bed to a chair (including a wheelchair)?: A Little Help needed standing up from a chair using your arms (e.g., wheelchair or bedside chair)?: A Little Help needed to walk in hospital room?: A Little Help needed climbing 3-5 steps with a railing? : A Little 6 Click Score: 20    End of Session Equipment Utilized During Treatment: Gait belt Activity Tolerance: Patient limited by pain Patient left: in chair Nurse Communication: Mobility status PT Visit Diagnosis: Unsteadiness on feet (R26.81);Difficulty in walking, not elsewhere classified (R26.2);Muscle weakness (generalized) (M62.81);History of falling (Z91.81);Other abnormalities of gait and mobility (R26.89)     Time: 1440-1450 PT Time Calculation (min) (ACUTE ONLY): 10 min  Charges:  $Gait Training: 8-22 mins                     3:16 PM, 11/11/19 Etta Grandchild, PT, DPT Physical Therapist - [facility]  [phone] ([place])    , C 11/11/2019, 3:12 PM

## 2019-11-11 NOTE — BHH Counselor (Signed)
CSW followed up to see if patient was open to a group home.  Patient reports that he is.    CSW will begin looking for group homes.  Assunta Curtis, MSW, LCSW 11/11/2019 11:06 AM

## 2019-11-11 NOTE — Plan of Care (Signed)
Patient  knowledgeable    of information  receive regarding [place] [facility]  and unit programing .  Patient able to vent frustration  appropriately  and demonstrate self controled,  able to come to staff for  any concerns .Marland Kitchen Able to state medication  received and knowledgeable of their  usage .    Problem: Education: Goal: Knowledge of  General Education information/materials will improve Outcome: Progressing   Problem: Coping: Goal: Ability to verbalize frustrations and anger appropriately will improve Outcome: Progressing Goal: Ability to demonstrate self-control will improve Outcome: Progressing   Problem: Safety: Goal: Periods of time without injury will increase Outcome: Progressing   Problem: Education: Goal: Knowledge of the prescribed therapeutic regimen will improve Outcome: Progressing   Problem: Medication: Goal: Compliance with prescribed medication regimen will improve Outcome: Progressing

## 2019-11-11 NOTE — Progress Notes (Signed)
Inpatient Diabetes Program Recommendations  AACE/ADA: New Consensus Statement on Inpatient Glycemic Control   Target Ranges:  Prepandial:   less than 140 mg/dL      Peak postprandial:   less than 180 mg/dL (1-2 hours)      Critically ill patients:  140 - 180 mg/dL   Results for LARUE, POITEVINT (MRN [id number]) as of 11/11/2019 14:10  Ref. Range 11/10/2019 07:01 11/10/2019 11:33 11/10/2019 16:11 11/10/2019 21:04 11/11/2019 06:57 11/11/2019 11:12  Glucose-Capillary Latest Ref Range: 70 - 99 mg/dL 258 (H) 451 (H) 374 (H) 258 (H) 237 (H) 286 (H)   Review of Glycemic Control  Outpatient Diabetes medications: Lantus 19 units daily, Novolog 10 units TID with meals Current orders for Inpatient glycemic control: Lantus 15 units QHS, Novolog 0-15 units TID with meals, Novolog 0-5 units QHS, Novolog 10 units TID with meals  Inpatient Diabetes Program Recommendations:   Insulin-Basal: Please consider increasing Lantus to 19 units QHS.  Meal Coverage: Please consider increasing meal coverage to Novolog 12 units TID with meals.  Thanks, Barnie Alderman, RN, MSN, CDE Diabetes Coordinator Inpatient Diabetes Program [phone] (Team Pager from 8am to 5pm)

## 2019-11-12 LAB — GLUCOSE, CAPILLARY
Glucose-Capillary: 209 mg/dL — ABNORMAL HIGH (ref 70–99)
Glucose-Capillary: 200 mg/dL — ABNORMAL HIGH (ref 70–99)
Glucose-Capillary: 228 mg/dL — ABNORMAL HIGH (ref 70–99)
Glucose-Capillary: 320 mg/dL — ABNORMAL HIGH (ref 70–99)

## 2019-11-12 MED ORDER — INSULIN ASPART 100 UNIT/ML ~~LOC~~ SOLN
12.0000 [IU] | Freq: Three times a day (TID) | SUBCUTANEOUS | Status: DC
  Administered 2019-11-12 – 2019-11-13 (×5): 12 [IU] via SUBCUTANEOUS
  Filled 2019-11-12 (×5): qty 1

## 2019-11-12 NOTE — [facility] Group Notes (Signed)
LCSW Group Therapy Note  11/12/2019 1:00 PM  Type of Therapy/Topic:  Group Therapy:  Balance in Life  Participation Level:  Did Not Attend  Description of Group:    This group will address the concept of balance and how it feels and looks when one is unbalanced. Patients will be encouraged to process areas in their lives that are out of balance and identify reasons for remaining unbalanced. Facilitators will guide patients in utilizing problem-solving interventions to address and correct the stressor making their life unbalanced. Understanding and applying boundaries will be explored and addressed for obtaining and maintaining a balanced life. Patients will be encouraged to explore ways to assertively make their unbalanced needs known to significant others in their lives, using other group members and facilitator for support and feedback.  Therapeutic Goals: 1. Patient will identify two or more emotions or situations they have that consume much of in their lives. 2. Patient will identify signs/triggers that life has become out of balance:  3. Patient will identify two ways to set boundaries in order to achieve balance in their lives:  4. Patient will demonstrate ability to communicate their needs through discussion and/or role plays  Summary of Patient Progress: X  Therapeutic Modalities:   Cognitive Behavioral Therapy Solution-Focused Therapy Assertiveness Training  Assunta Curtis MSW, LCSW 11/12/2019 1:59 PM

## 2019-11-12 NOTE — Progress Notes (Signed)
CSW contacted Joaquim Lai with [facility] group home who reported she does have a bed available and asked CSW to fax the [facility] over. CSW sent FL2, fax was successful.   Evalina Field, MSW, LCSW Clinical Social Work 11/12/2019 11:05 AM

## 2019-11-12 NOTE — Progress Notes (Signed)
[facility] MD Progress Note  11/12/2019 3:35 PM Richard [name]  MRN:  [id number] Subjective: Patient seen chart reviewed.  Patient has no new complaints except that he would like to have a shower chair so that he can take a shower.  Mood is still nervous but situationally.  No voiced suicidal ideation.  Blood sugars are slightly more under control thank you very much to the assistance of the hospitalist. Principal Problem: MDD (major depressive disorder) Diagnosis: Principal Problem:   MDD (major depressive disorder) Active Problems:   Diabetes ([place])   HTN (hypertension)   Cocaine abuse ([place])   Below-knee amputation of left lower extremity ([place])   PAD (peripheral artery disease) ([place])  Total Time spent with patient: 30 minutes  Past Psychiatric History: Past history of substance abuse  Past Medical History:  Past Medical History:  Diagnosis Date  . Atrial fibrillation ([place])    per patient  . Cancer ([place])   . CHF (congestive heart failure) ([place])   . Chronic back pain   . Chronic leg pain   . Coronary artery disease   . Diabetes mellitus without complication ([place])   . Hypertension   . Neuropathy     Past Surgical History:  Procedure Laterality Date  . AMPUTATION Left 03/13/2019   Procedure: AMPUTATION BELOW KNEE;  Surgeon: Algernon Huxley, MD;  Location: [facility] ORS;  Service: Vascular;  Laterality: Left;  . CORONARY ANGIOPLASTY WITH STENT PLACEMENT    . EMBOLECTOMY Left 03/12/2019   Procedure: EMBOLECTOMY SFA POPLITEAL;  Surgeon: Algernon Huxley, MD;  Location: [facility] ORS;  Service: Vascular;  Laterality: Left;  . ENDARTERECTOMY FEMORAL Left 03/12/2019   Procedure: ENDARTERECTOMY FEMORAL;  Surgeon: Algernon Huxley, MD;  Location: [facility] ORS;  Service: Vascular;  Laterality: Left;  . ESOPHAGOGASTRODUODENOSCOPY N/A 09/11/2017   Procedure: ESOPHAGOGASTRODUODENOSCOPY (EGD);  Surgeon: Virgel Manifold, MD;  Location: [facility] ENDOSCOPY;  Service: Endoscopy;  Laterality: N/A;  . LOWER EXTREMITY ANGIOGRAPHY  Left 11/17/2018   Procedure: Lower Extremity Angiography, with possible intervention;  Surgeon: Algernon Huxley, MD;  Location: [place] CV LAB;  Service: Cardiovascular;  Laterality: Left;  . LOWER EXTREMITY ANGIOGRAPHY Left 03/04/2019   Procedure: Lower Extremity Angiography;  Surgeon: Algernon Huxley, MD;  Location: [place] CV LAB;  Service: Cardiovascular;  Laterality: Left;  . LOWER EXTREMITY ANGIOGRAPHY Left 07/01/2019   Procedure: LOWER EXTREMITY ANGIOGRAPHY;  Surgeon: Algernon Huxley, MD;  Location: [place] CV LAB;  Service: Cardiovascular;  Laterality: Left;  . pancreas removed     Family History:  Family History  Problem Relation Age of Onset  . CAD Mother   . Diabetes Mellitus II Mother   . Diabetes Mellitus II Father    Family Psychiatric  History: See previous Social History:  Social History   Substance and Sexual Activity  Alcohol Use No  . Alcohol/week: 0.0 standard drinks     Social History   Substance and Sexual Activity  Drug Use Yes  . Types: Cocaine, Benzodiazepines   Comment: Has used in the past-heroin. Cocaine used over a year ago per pt.    Social History   Socioeconomic History  . Marital status: Single    Spouse name: Not on file  . Number of children: Not on file  . Years of education: Not on file  . Highest education level: Not on file  Occupational History  . Not on file  Tobacco Use  . Smoking status: Current Every Day Smoker    Packs/day: 0.50  Years: 50.00    Pack years: 25.00  . Smokeless tobacco: Never Used  Substance and Sexual Activity  . Alcohol use: No    Alcohol/week: 0.0 standard drinks  . Drug use: Yes    Types: Cocaine, Benzodiazepines    Comment: Has used in the past-heroin. Cocaine used over a year ago per pt.  . Sexual activity: Not Currently  Other Topics Concern  . Not on file  Social History Narrative  . Not on file   Social Determinants of Health   Financial Resource Strain:   . Difficulty of Paying  Living Expenses: Not on file  Food Insecurity:   . Worried About Charity fundraiser in the Last Year: Not on file  . Ran Out of Food in the Last Year: Not on file  Transportation Needs:   . Lack of Transportation (Medical): Not on file  . Lack of Transportation (Non-Medical): Not on file  Physical Activity:   . Days of Exercise per Week: Not on file  . Minutes of Exercise per Session: Not on file  Stress:   . Feeling of Stress : Not on file  Social Connections:   . Frequency of Communication with Friends and Family: Not on file  . Frequency of Social Gatherings with Friends and Family: Not on file  . Attends Religious Services: Not on file  . Active Member of Clubs or Organizations: Not on file  . Attends Archivist Meetings: Not on file  . Marital Status: Not on file   Additional Social History:                         Sleep: Fair  Appetite:  Fair  Current Medications: Current Facility-Administered Medications  Medication Dose Route Frequency Provider Last Rate Last Admin  . alum & mag hydroxide-simeth (MAALOX/MYLANTA) 200-200-20 MG/5ML suspension 30 mL  30 mL Oral Q4H PRN Cristofano, Dorene Ar, MD   30 mL at 11/10/19 1608  . apixaban (ELIQUIS) tablet 5 mg  5 mg Oral Q12H Cristofano, Dorene Ar, MD   5 mg at 11/12/19 0814  . carvedilol (COREG) tablet 6.25 mg  6.25 mg Oral BID WC Adream Parzych, Madie Reno, MD   6.25 mg at 11/12/19 [id number]  . escitalopram (LEXAPRO) tablet 5 mg  5 mg Oral Daily Cristofano, Dorene Ar, MD   5 mg at 11/12/19 0813  . famotidine (PEPCID) tablet 20 mg  20 mg Oral BID Cristofano, Dorene Ar, MD   20 mg at 11/12/19 0813  . feeding supplement (GLUCERNA SHAKE) (GLUCERNA SHAKE) liquid 237 mL  237 mL Oral BID BM Lavina Hamman, MD   237 mL at 11/12/19 1136  . folic acid (FOLVITE) tablet 1 mg  1 mg Oral Daily Cristofano, Dorene Ar, MD   1 mg at 11/12/19 0813  . gabapentin (NEURONTIN) capsule 300 mg  300 mg Oral TID Fidel Caggiano T, MD   300 mg at 11/12/19 1139  .  hydrOXYzine (ATARAX/VISTARIL) tablet 25 mg  25 mg Oral TID PRN Cristofano, Dorene Ar, MD      . ibuprofen (ADVIL) tablet 600 mg  600 mg Oral Q6H PRN Lateesha Bezold, Madie Reno, MD   600 mg at 11/11/19 2139  . insulin aspart (novoLOG) injection 0-15 Units  0-15 Units Subcutaneous TID WC Lavina Hamman, MD   3 Units at 11/12/19 1140  . insulin aspart (novoLOG) injection 0-5 Units  0-5 Units Subcutaneous QHS Lavina Hamman, MD   2  Units at 11/11/19 2102  . insulin aspart (novoLOG) injection 12 Units  12 Units Subcutaneous TID WC Lorella Nimrod, MD   12 Units at 11/12/19 1140  . insulin glargine (LANTUS) injection 20 Units  20 Units Subcutaneous QHS Lorella Nimrod, MD   20 Units at 11/11/19 2108  . magnesium hydroxide (MILK OF MAGNESIA) suspension 30 mL  30 mL Oral Daily PRN Cristofano, Paul A, MD      . mirtazapine (REMERON) tablet 45 mg  45 mg Oral QHS Cate Oravec, Madie Reno, MD   45 mg at 11/11/19 2139  . multivitamin with minerals tablet 1 tablet  1 tablet Oral Daily Cristofano, Dorene Ar, MD   1 tablet at 11/12/19 0813  . rosuvastatin (CRESTOR) tablet 40 mg  40 mg Oral q1800 Cristofano, Dorene Ar, MD   40 mg at 11/11/19 1638  . temazepam (RESTORIL) capsule 15 mg  15 mg Oral QHS Cylah Fannin, Madie Reno, MD   15 mg at 11/11/19 2144  . thiamine tablet 100 mg  100 mg Oral Daily Cristofano, Dorene Ar, MD   100 mg at 11/12/19 0813  . traZODone (DESYREL) tablet 50 mg  50 mg Oral QHS Eli Pattillo, Madie Reno, MD   50 mg at 11/11/19 2146    Lab Results:  Results for orders placed or performed during the hospital encounter of 11/04/19 (from the past 48 hour(s))  Glucose, capillary     Status: Abnormal   Collection Time: 11/10/19  4:11 PM  Result Value Ref Range   Glucose-Capillary 374 (H) 70 - 99 mg/dL  Glucose, capillary     Status: Abnormal   Collection Time: 11/10/19  9:04 PM  Result Value Ref Range   Glucose-Capillary 258 (H) 70 - 99 mg/dL  Glucose, capillary     Status: Abnormal   Collection Time: 11/11/19  6:57 AM  Result Value Ref Range    Glucose-Capillary 237 (H) 70 - 99 mg/dL  Glucose, capillary     Status: Abnormal   Collection Time: 11/11/19 11:12 AM  Result Value Ref Range   Glucose-Capillary 286 (H) 70 - 99 mg/dL  Glucose, capillary     Status: Abnormal   Collection Time: 11/11/19  4:16 PM  Result Value Ref Range   Glucose-Capillary 195 (H) 70 - 99 mg/dL   Comment 1 Notify RN   Glucose, capillary     Status: Abnormal   Collection Time: 11/11/19  8:49 PM  Result Value Ref Range   Glucose-Capillary 212 (H) 70 - 99 mg/dL   Comment 1 Notify RN   Glucose, capillary     Status: Abnormal   Collection Time: 11/12/19  7:00 AM  Result Value Ref Range   Glucose-Capillary 209 (H) 70 - 99 mg/dL  Glucose, capillary     Status: Abnormal   Collection Time: 11/12/19 11:35 AM  Result Value Ref Range   Glucose-Capillary 200 (H) 70 - 99 mg/dL    Blood Alcohol level:  Lab Results  Component Value Date   ETH <10 10/29/2019   ETH <10 0000000    Metabolic Disorder Labs: Lab Results  Component Value Date   HGBA1C 14.9 (H) 10/30/2019   MPG 380.93 10/30/2019   MPG >398 02/28/2019   No results found for: PROLACTIN Lab Results  Component Value Date   CHOL 186 08/10/2015   TRIG 91 10/29/2019   HDL 25 (L) 08/10/2015   CHOLHDL 7.4 08/10/2015   VLDL 29 08/10/2015   LDLCALC 132 (H) 08/10/2015    Physical Findings: AIMS:  , ,  ,  ,  CIWA:    COWS:     Musculoskeletal: Strength & Muscle Tone: within normal limits Gait & Station: unsteady Patient leans: N/A  Psychiatric Specialty Exam: Physical Exam  Nursing note and vitals reviewed. Constitutional: He appears well-developed and well-nourished.  HENT:  Head: Normocephalic and atraumatic.  Eyes: Pupils are equal, round, and reactive to light. Conjunctivae are normal.  Cardiovascular: Normal heart sounds.  Respiratory: Effort normal.  GI: Soft.  Musculoskeletal:        General: Normal range of motion.     Cervical back: Normal range of motion.   Neurological: He is alert.  Skin: Skin is warm and dry.  Psychiatric: He has a normal mood and affect. His speech is normal and behavior is normal. Judgment and thought content normal. Cognition and memory are normal.    Review of Systems  Constitutional: Negative.   HENT: Negative.   Eyes: Negative.   Respiratory: Negative.   Cardiovascular: Negative.   Gastrointestinal: Negative.   Musculoskeletal: Negative.   Skin: Negative.   Neurological: Negative.   Psychiatric/Behavioral: Negative.     Blood pressure (!) 156/84, pulse 83, temperature 98.8 F (37.1 C), temperature source Oral, resp. rate 18, height 5\' 11"  (1.803 m), SpO2 100 %.Body mass index is 23.57 kg/m.  General Appearance: Casual  Eye Contact:  Minimal  Speech:  Normal Rate  Volume:  Decreased  Mood:  Euthymic  Affect:  Congruent  Thought Process:  Coherent  Orientation:  Full (Time, Place, and Person)  Thought Content:  Logical  Suicidal Thoughts:  No  Homicidal Thoughts:  No  Memory:  Immediate;   Fair Recent;   Fair Remote;   Fair  Judgement:  Fair  Insight:  Fair  Psychomotor Activity:  Normal  Concentration:  Concentration: Fair  Recall:  [employer] of Knowledge:  Fair  Language:  Fair  Akathisia:  No  Handed:  Right  AIMS (if indicated):     Assets:  Desire for Improvement  ADL's:  Impaired  Cognition:  WNL  Sleep:  Number of Hours: 7.3     Treatment Plan Summary: Daily contact with patient to assess and evaluate symptoms and progress in treatment, Medication management and Plan No change to treatment for depression.  Continue to advise patient to keep his diet under control.  Continue with attempts to control his diabetes.  We are working on possibly finding a group home environment for him as we are out of any other resources for disposition at this time.  Alethia Berthold, MD 11/12/2019, 3:35 PM

## 2019-11-12 NOTE — Progress Notes (Signed)
Care of patient taken over at 2300, resting in bed quietly at this time, awaken for post fall vitals, no issues noted on shift. Patient seems to be in bed resting quietly

## 2019-11-12 NOTE — Plan of Care (Signed)
Patient  knowledgeable    of information  receive regarding [place] [facility]  and unit programing .  Patient able to vent frustration  appropriately  and demonstrate self controled,  able to come to staff for  any concerns .Marland Kitchen Able to state medication  received and knowledgeable of their  usage . Aware of placement concerns    Problem: Education: Goal: Knowledge of [facility] Education information/materials will improve Outcome: Progressing   Problem: Coping: Goal: Ability to verbalize frustrations and anger appropriately will improve Outcome: Progressing Goal: Ability to demonstrate self-control will improve Outcome: Progressing   Problem: Safety: Goal: Periods of time without injury will increase Outcome: Progressing   Problem: Education: Goal: Knowledge of the prescribed therapeutic regimen will improve Outcome: Progressing   Problem: Medication: Goal: Compliance with prescribed medication regimen will improve Outcome: Progressing

## 2019-11-12 NOTE — Progress Notes (Signed)
PROGRESS NOTE    Zorawar Remedios  [id number] DOB: 12/17/1955 DOA: 11/04/2019 PCP: Patient, No Pcp Per   Brief Narrative:  Edo Grzyb is a 64 y.o. male with Past medical history of A. fib, CHF, CAD, substance abuse, type II DM. Patient is coming from Home Patient was initially admitted on 10/29/2019 after being found on the ground confused and altered.  He was intubated in the ER for airway protection.  Admitted to the ICU.  Patient was extubated and transferred to floor. After patient was able to tolerate oral diet patient was medically cleared and transferred to behavioral health for ongoing suicidal and homicidal ideation. We were consulted to manage his diabetes.  Subjective: Patient was sitting comfortably when seen today.  No new complaints.  Assessment & Plan:   Principal Problem:   MDD (major depressive disorder) Active Problems:   Diabetes ([place])   HTN (hypertension)   Cocaine abuse ([place])   Below-knee amputation of left lower extremity ([facility])   PAD (peripheral artery disease) ([facility])  Type 2 diabetes mellitus, uncontrolled with hyperglycemia. Recently admitted for DKA.  A1c of 14.9.  CBG in 200s Some improvement after increasing the dose of Lantus yesterday. -Continue Lantus to 20 units at bedtime. -Increase mealtime coverage with 12 units. -Continue moderate sliding scale. -We will continue to follow.  Hypertension.  Blood pressure within goal -Continue home regimen.  Paroxysmal A. fib. Continue home regimen. Continue Eliquis.   Severe protein calorie malnutrition. Continuing nutritional supplements.  Suicidal ideation. Severe depression. Polysubstance abuse. Management per psychiatry.   Objective: Vitals:   11/11/19 2053 11/11/19 2318 11/12/19 0625 11/12/19 1653  BP: (!) 144/73 128/61 (!) 156/84 (!) 150/79  Pulse: 79 93 83 85  Resp: 18 18 18    Temp: 98.3 F (36.8 C) 98.6 F (37 C) 98.8 F (37.1 C)   TempSrc: Oral Oral Oral   SpO2: 100% 98% 100%    Height:       No intake or output data in the 24 hours ending 11/12/19 1846 There were no vitals filed for this visit.  Examination:  General exam: Appears calm and comfortable  Respiratory system: Clear to auscultation. Respiratory effort normal. Cardiovascular system: S1 & S2 heard, RRR. No JVD, murmurs, rubs, gallops or clicks. Gastrointestinal system: Soft, nontender, nondistended, bowel sounds positive. Central nervous system: Alert and oriented. No focal neurological deficits.Symmetric 5 x 5 power. Extremities: No edema, no cyanosis, pulses intact and symmetrical. Skin: No rashes, lesions or ulcers Psychiatry: Judgement and insight appear normal. Mood & affect appropriate.    DVT prophylaxis: Eliquis Code Status: Full Family Communication: No family at bedside Disposition Plan: Pending improvement.  Admitted on behavioral health service.  Consultants:   [facility]  Procedures:  Antimicrobials:   Data Reviewed: I have personally reviewed following labs and imaging studies  CBC: Recent Labs  Lab 11/07/19 0736 11/10/19 0637  WBC 9.4 8.8  HGB 9.9* 9.4*  HCT 31.8* 30.1*  MCV 90.9 90.7  PLT 548* [id number]*   Basic Metabolic Panel: Recent Labs  Lab 11/06/19 2134 11/07/19 0736 11/10/19 0637  GLUCOSE 469*  --   --   CREATININE  --  0.89 0.78   GFR: Estimated Creatinine Clearance: 99.4 mL/min (by C-G formula based on SCr of 0.78 mg/dL). Liver Function Tests: No results for input(s): AST, ALT, ALKPHOS, BILITOT, PROT, ALBUMIN in the last 168 hours. No results for input(s): LIPASE, AMYLASE in the last 168 hours. No results for input(s): AMMONIA in the last 168 hours. Coagulation Profile:  No results for input(s): INR, PROTIME in the last 168 hours. Cardiac Enzymes: No results for input(s): CKTOTAL, CKMB, CKMBINDEX, TROPONINI in the last 168 hours. BNP (last 3 results) No results for input(s): PROBNP in the last 8760 hours. HbA1C: No results for input(s): HGBA1C in the  last 72 hours. CBG: Recent Labs  Lab 11/11/19 1616 11/11/19 2049 11/12/19 0700 11/12/19 1135 11/12/19 1607  GLUCAP 195* 212* 209* 200* 228*   Lipid Profile: No results for input(s): CHOL, HDL, LDLCALC, TRIG, CHOLHDL, LDLDIRECT in the last 72 hours. Thyroid Function Tests: No results for input(s): TSH, T4TOTAL, FREET4, T3FREE, THYROIDAB in the last 72 hours. Anemia Panel: No results for input(s): VITAMINB12, FOLATE, FERRITIN, TIBC, IRON, RETICCTPCT in the last 72 hours. Sepsis Labs: No results for input(s): PROCALCITON, LATICACIDVEN in the last 168 hours.  No results found for this or any previous visit (from the past 240 hour(s)).   Radiology Studies: No results found.  Scheduled Meds: . apixaban  5 mg Oral Q12H  . carvedilol  6.25 mg Oral BID WC  . escitalopram  5 mg Oral Daily  . famotidine  20 mg Oral BID  . feeding supplement (GLUCERNA SHAKE)  237 mL Oral BID BM  . folic acid  1 mg Oral Daily  . gabapentin  300 mg Oral TID  . insulin aspart  0-15 Units Subcutaneous TID WC  . insulin aspart  0-5 Units Subcutaneous QHS  . insulin aspart  12 Units Subcutaneous TID WC  . insulin glargine  20 Units Subcutaneous QHS  . mirtazapine  45 mg Oral QHS  . multivitamin with minerals  1 tablet Oral Daily  . rosuvastatin  40 mg Oral q1800  . temazepam  15 mg Oral QHS  . thiamine  100 mg Oral Daily  . traZODone  50 mg Oral QHS   Continuous Infusions:   LOS: 8 days   Time spent: 30 minutes  Lorella Nimrod, MD [facility] Pager [phone]  If 7PM-7AM, please contact night-coverage [email or web address] Password [facility] 11/12/2019, 6:46 PM   This record has been created using Dragon voice recognition software. Errors have been sought and corrected,but may not always be located. Such creation errors do not reflect on the standard of care.

## 2019-11-12 NOTE — BHH Counselor (Signed)
CSW followed up with Joaquim Lai [phone] to see if she has reviewed the patient's paperwork.  She reports that the QP has not reviewed the paperwork at this time.   Assunta Curtis, MSW, LCSW 11/12/2019 2:41 PM

## 2019-11-12 NOTE — Progress Notes (Signed)
CSW spoke with Andreas [place] who reported that legally she cannot do anything to get pt out of the house because he is on the lease, but she does not want him to return there because she feels he is a danger to himself and there is no one there to care for him as she works and also has a family staying there who is currently involved in a CPS case. [name] reported she has a warrant against pt. [name] ended the conversation that legally pt can return there due to him being on the lease, but she really does not want him to return there.  Evalina Field, MSW, LCSW Clinical Social Work 11/12/2019 11:23 AM

## 2019-11-12 NOTE — Progress Notes (Signed)
Continue to move about unit   In wheelchair . Continues to apply his prosthetic   To his leg in am . Limited insight into  How his diet affect his  Body . Limited interaction with peers  And staff .  Hygiene  Remains  Inappropriate   Compliant with medication  Encourage  Group attendance

## 2019-11-12 NOTE — Progress Notes (Signed)
D: Major Depression ./ Placement   A:Patient  knowledgeable    of information  receive regarding [place] [facility]  and unit programing . Patient has agreeded to  Go to a Group / Rest Home .  Patient able to vent frustration  appropriately  and demonstrate self controled,  able to come to staff for  any concerns .Marland Kitchen Able to state medication  received and knowledgeable of their  usage . Aware of placement concerns. Patient stated slept good last night .Stated appetite good and energy level  normal. Stated concentration is good .  Denies Depression , hopeless and anxiety  Denies suicidal  homicidal ideations  .  No auditory hallucinations  No pain concerns . Appropriate ADL'S. Interacting with peers and staff.   Encourage patient participation with unit programming . Instruction  Given on  Medication , verbalize understanding.  R: Voice no other concerns. Staff continue to monitor

## 2019-11-12 NOTE — Progress Notes (Signed)
Recreation Therapy Notes  Date: 11/12/2019  Time: 9:30 am  Location: Craft room  Behavioral response: Appropriate  Intervention Topic: Relaxation   Discussion/Intervention:  Group content today was focused on relaxation. The group defined relaxation and identified healthy ways to relax. Individuals expressed how much time they spend relaxing. Patients expressed how much their life would be if they did not make time for themselves to relax. The group stated ways they could improve their relaxation techniques in the future.  Individuals participated in the intervention "Time to Relax" where they had a chance to experience different relaxation techniques.  Clinical Observations/Feedback:  Patient came to group late and stated that he likes to exercise to relax. Individual was social with peers and staff while participating in the intervention during group.  [name] LRT/CTRS         Richard [name] 11/12/2019 11:32 AM

## 2019-11-13 ENCOUNTER — Ambulatory Visit (INDEPENDENT_AMBULATORY_CARE_PROVIDER_SITE_OTHER): Payer: Medicare Other | Admitting: Nurse Practitioner

## 2019-11-13 ENCOUNTER — Encounter (INDEPENDENT_AMBULATORY_CARE_PROVIDER_SITE_OTHER): Payer: Medicare Other

## 2019-11-13 LAB — GLUCOSE, CAPILLARY
Glucose-Capillary: 284 mg/dL — ABNORMAL HIGH (ref 70–99)
Glucose-Capillary: 232 mg/dL — ABNORMAL HIGH (ref 70–99)
Glucose-Capillary: 268 mg/dL — ABNORMAL HIGH (ref 70–99)

## 2019-11-13 MED ORDER — THIAMINE HCL 100 MG PO TABS: 100.0000 mg | ORAL_TABLET | Freq: Every day | ORAL | 1 refills | Status: AC

## 2019-11-13 MED ORDER — INSULIN GLARGINE 100 UNIT/ML ~~LOC~~ SOLN
25.0000 [IU] | Freq: Every day | SUBCUTANEOUS | Status: DC
  Filled 2019-11-13: qty 0.25

## 2019-11-13 MED ORDER — TRAZODONE HCL 50 MG PO TABS: 50.0000 mg | ORAL_TABLET | Freq: Every day | ORAL | 1 refills | Status: AC

## 2019-11-13 MED ORDER — MIRTAZAPINE 45 MG PO TABS: 45.0000 mg | ORAL_TABLET | Freq: Every day | ORAL | 1 refills | Status: AC

## 2019-11-13 MED ORDER — ACCU-CHEK SOFTCLIX LANCETS MISC: 5 refills | Status: AC

## 2019-11-13 MED ORDER — INSULIN ASPART 100 UNIT/ML ~~LOC~~ SOLN: 12.0000 [IU] | Freq: Three times a day (TID) | SUBCUTANEOUS | 11 refills | Status: AC

## 2019-11-13 MED ORDER — FAMOTIDINE 20 MG PO TABS: 20.0000 mg | ORAL_TABLET | Freq: Two times a day (BID) | ORAL | 1 refills | Status: DC

## 2019-11-13 MED ORDER — CARVEDILOL 6.25 MG PO TABS: 6.2500 mg | ORAL_TABLET | Freq: Two times a day (BID) | ORAL | 1 refills | Status: AC

## 2019-11-13 MED ORDER — ACCU-CHEK AVIVA PLUS VI STRP: ORAL_STRIP | 5 refills | Status: AC

## 2019-11-13 MED ORDER — TEMAZEPAM 15 MG PO CAPS: 15.0000 mg | ORAL_CAPSULE | Freq: Every day | ORAL | 1 refills | Status: AC

## 2019-11-13 MED ORDER — GABAPENTIN 300 MG PO CAPS: 300.0000 mg | ORAL_CAPSULE | Freq: Three times a day (TID) | ORAL | 1 refills | Status: AC

## 2019-11-13 MED ORDER — INSULIN GLARGINE 100 UNIT/ML ~~LOC~~ SOLN: 25.0000 [IU] | Freq: Every day | SUBCUTANEOUS | 11 refills | Status: AC

## 2019-11-13 MED ORDER — ESCITALOPRAM OXALATE 5 MG PO TABS: 5.0000 mg | ORAL_TABLET | Freq: Every day | ORAL | 1 refills | Status: AC

## 2019-11-13 MED ORDER — ROSUVASTATIN CALCIUM 40 MG PO TABS: 40.0000 mg | ORAL_TABLET | Freq: Every day | ORAL | 1 refills | Status: AC

## 2019-11-13 MED ORDER — APIXABAN 5 MG PO TABS: 5.0000 mg | ORAL_TABLET | Freq: Two times a day (BID) | ORAL | 1 refills | Status: AC

## 2019-11-13 NOTE — Progress Notes (Signed)
D:Major Depression /Placement  A:Patient remain to use the wheel chair as a means of transportation on the unit . Patient continue to  Wear his prosthesis. Continue to show no insight into his diet  Which affects his diabetic  Disease.  Limited participation with unit programing . Poor hygiene efforts .  Patient remains to show support going to  Group home facility. Denies suicidal ideations . Appetite good . Denies any pain concerns Denies Depression and anxiety   .  Denies  auditory hallucinations  .   Encourage patient participation with unit programming . Instruction  Given on  Medication , verbalize understanding.  R: Voice no other concerns. Staff continue to monitor

## 2019-11-13 NOTE — Progress Notes (Signed)
Recreation Therapy Notes          Richard [name] 11/13/2019 11:08 AM

## 2019-11-13 NOTE — Progress Notes (Signed)
Patient was in his room upon arrival to the unit. Patient was pleasant during assessment denying SI/HI/AVH. Patient stated he was feeling better today but was a little anxious/depressed because his child's mother won't let him come back to live with her. Patient given encouragement. Patient compliant with medication administration per MD orders. Patient observed interacting appropriately with staff and peers on the unit. Patient given education to be active in his treatment plan. Patient being monitored Q 15 minutes for safety per unit protocol. Patient remains safe on the unit.

## 2019-11-13 NOTE — BHH Group Notes (Signed)
LCSW Group Therapy Note  11/13/2019 1:47 PM  Type of Therapy and Topic:  Group Therapy:  Feelings around Relapse and Recovery  Participation Level:  Did Not Attend   Description of Group:    Patients in this group will discuss emotions they experience before and after a relapse. They will process how experiencing these feelings, or avoidance of experiencing them, relates to having a relapse. Facilitator will guide patients to explore emotions they have related to recovery. Patients will be encouraged to process which emotions are more powerful. They will be guided to discuss the emotional reaction significant others in their lives may have to their relapse or recovery. Patients will be assisted in exploring ways to respond to the emotions of others without this contributing to a relapse.  Therapeutic Goals: 1. Patient will identify two or more emotions that lead to a relapse for them 2. Patient will identify two emotions that result when they relapse 3. Patient will identify two emotions related to recovery 4. Patient will demonstrate ability to communicate their needs through discussion and/or role plays   Summary of Patient Progress:  X   Therapeutic Modalities:   Cognitive Behavioral Therapy Solution-Focused Therapy Assertiveness Training Relapse Prevention Therapy   Juanetta Beets, MSW, [place] [facility] 11/13/2019 1:47 PM

## 2019-11-13 NOTE — BHH Counselor (Signed)
CSW spoke with Joaquim Lai at the group home about the patient being possibly  accepted to her group home.    She reports that patient may be accepted at [facility] but she will call this CSW back at 1:30pm to confirm.  Assunta Curtis, MSW, LCSW 11/13/2019 10:52 AM

## 2019-11-13 NOTE — BHH Suicide Risk Assessment (Signed)
[facility] Discharge Suicide Risk Assessment   Principal Problem: MDD (major depressive disorder) Discharge Diagnoses: Principal Problem:   MDD (major depressive disorder) Active Problems:   Diabetes ([place])   HTN (hypertension)   Cocaine abuse ([place])   Below-knee amputation of left lower extremity (HCC)   PAD (peripheral artery disease) ([place])   Total Time spent with patient: 30 minutes  Musculoskeletal: Strength & Muscle Tone: within normal limits Gait & Station: unsteady Patient leans: Left  Psychiatric Specialty Exam: Review of Systems  Constitutional: Negative.   HENT: Negative.   Eyes: Negative.   Respiratory: Negative.   Cardiovascular: Negative.   Gastrointestinal: Negative.   Musculoskeletal: Negative.   Skin: Negative.   Neurological: Negative.   Psychiatric/Behavioral: Negative.     Blood pressure 137/76, pulse 89, temperature 98.9 F (37.2 C), temperature source Oral, resp. rate 18, height 5\' 11"  (1.803 m), SpO2 100 %.Body mass index is 23.57 kg/m.  General Appearance: Casual  Eye Contact::  Good  Speech:  Clear and [id number]  Volume:  Normal  Mood:  Euthymic  Affect:  Congruent  Thought Process:  Goal Directed  Orientation:  Full (Time, Place, and Person)  Thought Content:  Logical  Suicidal Thoughts:  No  Homicidal Thoughts:  No  Memory:  Immediate;   Fair Recent;   Fair Remote;   Fair  Judgement:  Fair  Insight:  Fair  Psychomotor Activity:  Normal  Concentration:  Fair  Recall:  [employer] of Knowledge:Fair  Language: Fair  Akathisia:  No  Handed:  Right  AIMS (if indicated):     Assets:  Desire for Improvement Financial Resources/Insurance Housing Resilience  Sleep:  Number of Hours: 7  Cognition: WNL  ADL's:  Impaired   Mental Status Per Nursing Assessment::   On Admission:  Suicidal ideation indicated by others  Demographic Factors:  Male and Unemployed  Loss Factors: Loss of significant relationship  Historical  Factors: NA  Risk Reduction Factors:   Living with another person, especially a relative, Positive social support and Positive therapeutic relationship  Continued Clinical Symptoms:  Depression:   Comorbid alcohol abuse/dependence Alcohol/Substance Abuse/Dependencies  Cognitive Features That Contribute To Risk:  None    Suicide Risk:  Minimal: No identifiable suicidal ideation.  Patients presenting with no risk factors but with morbid ruminations; may be classified as minimal risk based on the severity of the depressive symptoms  Follow-up Information    [facility] Follow up.   Contact information: [place] [address] [phone]           Plan Of Care/Follow-up recommendations:  Activity:  Activity as tolerated Diet:  Diabetic diet Other:  Follow-up with [facility] outpatient for substance abuse treatment and follow-up with regular medical caregivers for diabetes and other medical needs  Alethia Berthold, MD 11/13/2019, 2:56 PM

## 2019-11-13 NOTE — Progress Notes (Signed)
  [facility] Adult Case Management Discharge Plan :  Will you be returning to the same living situation after discharge:  Yes,  home At discharge, do you have transportation home?: Yes,  taxi Do you have the ability to pay for your medications: Yes,  Medicare Part A & B  Release of information consent forms completed and in the chart;    Patient to Follow up at: Follow-up Information    [facility] Follow up on 11/19/2019.   Why: You have a zoom meeting scheduled with Sherrian Divers on 11/19/19 at 7am. Please contact Lanae Boast if you have any questions [phone]. Thank You! Contact information: [place] [address] [phone]           Next level of care provider has access to [facility] and Suicide Prevention discussed: Yes,  SPE completed with pts ex girlfriend     Has patient been referred to the Quitline?: Patient refused referral  Patient has been referred for addiction treatment: Pt. refused referral  Delfin Edis, LCSW 11/13/2019, 3:38 PM

## 2019-11-13 NOTE — Discharge Summary (Signed)
Physician Discharge Summary Note  Patient:  Richard [name] is an 64 y.o., male MRN:  [id number] DOB:  30-Aug-1956 Patient phone:  [phone] (home)  Patient address:   [address] [name] [address],  Total Time spent with patient: 30 minutes  Date of Admission:  11/04/2019 Date of Discharge: November 13, 2019  Reason for Admission: Admitted to the hospital because of suicidal ideation substance abuse depression major life losses  Principal Problem: MDD (major depressive disorder) Discharge Diagnoses: Principal Problem:   MDD (major depressive disorder) Active Problems:   Diabetes ([place])   HTN (hypertension)   Cocaine abuse ([place])   Below-knee amputation of left lower extremity ([place])   PAD (peripheral artery disease) ([place])   Past Psychiatric History: History of substance abuse problems  Past Medical History:  Past Medical History:  Diagnosis Date  . Atrial fibrillation ([place])    per patient  . Cancer ([place])   . CHF (congestive heart failure) ([place])   . Chronic back pain   . Chronic leg pain   . Coronary artery disease   . Diabetes mellitus without complication ([place])   . Hypertension   . Neuropathy     Past Surgical History:  Procedure Laterality Date  . AMPUTATION Left 03/13/2019   Procedure: AMPUTATION BELOW KNEE;  Surgeon: Algernon Huxley, MD;  Location: [facility] ORS;  Service: Vascular;  Laterality: Left;  . CORONARY ANGIOPLASTY WITH STENT PLACEMENT    . EMBOLECTOMY Left 03/12/2019   Procedure: EMBOLECTOMY SFA POPLITEAL;  Surgeon: Algernon Huxley, MD;  Location: [facility] ORS;  Service: Vascular;  Laterality: Left;  . ENDARTERECTOMY FEMORAL Left 03/12/2019   Procedure: ENDARTERECTOMY FEMORAL;  Surgeon: Algernon Huxley, MD;  Location: [facility] ORS;  Service: Vascular;  Laterality: Left;  . ESOPHAGOGASTRODUODENOSCOPY N/A 09/11/2017   Procedure: ESOPHAGOGASTRODUODENOSCOPY (EGD);  Surgeon: Virgel Manifold, MD;  Location: [facility] ENDOSCOPY;  Service: Endoscopy;  Laterality: N/A;  .  LOWER EXTREMITY ANGIOGRAPHY Left 11/17/2018   Procedure: Lower Extremity Angiography, with possible intervention;  Surgeon: Algernon Huxley, MD;  Location: [facility] CV LAB;  Service: Cardiovascular;  Laterality: Left;  . LOWER EXTREMITY ANGIOGRAPHY Left 03/04/2019   Procedure: Lower Extremity Angiography;  Surgeon: Algernon Huxley, MD;  Location: [facility] LAB;  Service: Cardiovascular;  Laterality: Left;  . LOWER EXTREMITY ANGIOGRAPHY Left 07/01/2019   Procedure: LOWER EXTREMITY ANGIOGRAPHY;  Surgeon: Algernon Huxley, MD;  Location: [facility] LAB;  Service: Cardiovascular;  Laterality: Left;  . pancreas removed     Family History:  Family History  Problem Relation Age of Onset  . CAD Mother   . Diabetes Mellitus II Mother   . Diabetes Mellitus II Father    Family Psychiatric  History: See previous Social History:  Social History   Substance and Sexual Activity  Alcohol Use No  . Alcohol/week: 0.0 standard drinks     Social History   Substance and Sexual Activity  Drug Use Yes  . Types: Cocaine, Benzodiazepines   Comment: Has used in the past-heroin. Cocaine used over a year ago per pt.    Social History   Socioeconomic History  . Marital status: Single    Spouse name: Not on file  . Number of children: Not on file  . Years of education: Not on file  . Highest education level: Not on file  Occupational History  . Not on file  Tobacco Use  . Smoking status: Current Every Day Smoker    Packs/day: 0.50  Years: 50.00    Pack years: 25.00  . Smokeless tobacco: Never Used  Substance and Sexual Activity  . Alcohol use: No    Alcohol/week: 0.0 standard drinks  . Drug use: Yes    Types: Cocaine, Benzodiazepines    Comment: Has used in the past-heroin. Cocaine used over a year ago per pt.  . Sexual activity: Not Currently  Other Topics Concern  . Not on file  Social History Narrative  . Not on file   Social Determinants of Health   Financial Resource Strain:    . Difficulty of Paying Living Expenses: Not on file  Food Insecurity:   . Worried About Charity fundraiser in the Last Year: Not on file  . Ran Out of Food in the Last Year: Not on file  Transportation Needs:   . Lack of Transportation (Medical): Not on file  . Lack of Transportation (Non-Medical): Not on file  Physical Activity:   . Days of Exercise per Week: Not on file  . Minutes of Exercise per Session: Not on file  Stress:   . Feeling of Stress : Not on file  Social Connections:   . Frequency of Communication with Friends and Family: Not on file  . Frequency of Social Gatherings with Friends and Family: Not on file  . Attends Religious Services: Not on file  . Active Member of Clubs or Organizations: Not on file  . Attends Archivist Meetings: Not on file  . Marital Status: Not on file    Hospital Course: Patient admitted to the hospital and maintained on 15-minute checks for the most part.  Initially very sad and depressed and hopeless.  Did not however display any dangerous or aggressive behavior in the hospital.  Mood was treated with Remeron gradually increased and later a small dose of Lexapro added.  Patient gradually showed improvement in mood and affect.  Became more interactive with others.  He consistently denied suicidal ideation and showed no psychotic symptoms.  Patient's diabetes was somewhat difficult to control.  Part of this was diet compliance.  Thank you to medicine for assistance and thank you to diabetes consultant.  At the time of discharge she is fairly stable with his blood sugars although they are still too high.  He has been advised of this and of the importance of getting active medical treatment.  We made some attempt to find other places for the patient to live including a group home but were unsuccessful.  It became clear that going back to the home where he had previously lived with his legal right and that he and his woman were at least accepting  of it.  Patient has been advised to these and is agreeable to the plan.  He has been referred to [facility] for outpatient mental health and substance abuse treatment.  Mood symptoms are improved.  No suicidal thought.  Eating better.  Physical Findings: AIMS:  , ,  ,  ,    CIWA:    COWS:     Musculoskeletal: Strength & Muscle Tone: within normal limits Gait & Station: unsteady Patient leans: N/A  Psychiatric Specialty Exam: Physical Exam  Nursing note and vitals reviewed. Constitutional: He appears well-developed and well-nourished.  HENT:  Head: Normocephalic and atraumatic.  Eyes: Pupils are equal, round, and reactive to light. Conjunctivae are normal.  Cardiovascular: Regular rhythm and normal heart sounds.  Respiratory: Effort normal.  GI: Soft.  Musculoskeletal:  General: Normal range of motion.     Cervical back: Normal range of motion.  Neurological: He is alert.  Skin: Skin is warm and dry.  Psychiatric: He has a normal mood and affect. His speech is normal and behavior is normal. Judgment and thought content normal. Cognition and memory are normal.    Review of Systems  Constitutional: Negative.   HENT: Negative.   Eyes: Negative.   Respiratory: Negative.   Cardiovascular: Negative.   Gastrointestinal: Negative.   Musculoskeletal: Negative.   Skin: Negative.   Neurological: Negative.   Psychiatric/Behavioral: Negative.     Blood pressure 137/76, pulse 89, temperature 98.9 F (37.2 C), temperature source Oral, resp. rate 18, height 5' 11"  (1.803 m), SpO2 100 %.Body mass index is 23.57 kg/m.  General Appearance: Casual  Eye Contact:  Good  Speech:  Clear and Coherent  Volume:  Normal  Mood:  Euthymic  Affect:  Constricted  Thought Process:  Goal Directed  Orientation:  Full (Time, Place, and Person)  Thought Content:  Logical  Suicidal Thoughts:  No  Homicidal Thoughts:  No  Memory:  Immediate;   Fair Recent;   Fair Remote;   Fair  Judgement:  Fair   Insight:  Fair  Psychomotor Activity:  Decreased  Concentration:  Concentration: Fair  Recall:  [employer] of Knowledge:  Fair  Language:  Fair  Akathisia:  No  Handed:  Right  AIMS (if indicated):     Assets:  Desire for Improvement Housing  ADL's:  Impaired  Cognition:  WNL  Sleep:  Number of Hours: 7        Has this patient used any form of tobacco in the last 30 days? (Cigarettes, Smokeless Tobacco, Cigars, and/or Pipes) Yes, Yes, A prescription for an FDA-approved tobacco cessation medication was offered at discharge and the patient refused  Blood Alcohol level:  Lab Results  Component Value Date   [facility] <10 10/29/2019   ETH <10 16/07/9603    Metabolic Disorder Labs:  Lab Results  Component Value Date   HGBA1C 14.9 (H) 10/30/2019   MPG 380.93 10/30/2019   MPG >398 02/28/2019   No results found for: PROLACTIN Lab Results  Component Value Date   CHOL 186 08/10/2015   TRIG 91 10/29/2019   HDL 25 (L) 08/10/2015   CHOLHDL 7.4 08/10/2015   VLDL 29 08/10/2015   LDLCALC 132 (H) 08/10/2015    See Psychiatric Specialty Exam and Suicide Risk Assessment completed by Attending Physician prior to discharge.  Discharge destination:  Home  Is patient on multiple antipsychotic therapies at discharge:  No   Has Patient had three or more failed trials of antipsychotic monotherapy by history:  No  Recommended Plan for Multiple Antipsychotic Therapies: NA  Discharge Instructions    Diet - low sodium heart healthy   Complete by: As directed    Increase activity slowly   Complete by: As directed      Allergies as of 11/13/2019   No Known Allergies     Medication List    STOP taking these medications   Accu-Chek Aviva Plus w/Device Kit   acetaminophen 325 MG tablet Commonly known as: TYLENOL   Ensure Max Protein Liqd   multivitamin with minerals Tabs tablet   Needles & Syringes Misc   pantoprazole 40 MG tablet Commonly known as: Protonix   ticagrelor 90  MG Tabs tablet Commonly known as: BRILINTA     TAKE these medications     Indication  Accu-Chek  Aviva Plus test strip Generic drug: glucose blood U ONCE TO BID UTD  Indication: Diabetes   Accu-Chek Softclix Lancets lancets TEST 1-2 XD  Indication: Diabetes   apixaban 5 MG Tabs tablet Commonly known as: ELIQUIS Take 1 tablet (5 mg total) by mouth every 12 (twelve) hours. What changed: when to take this  Indication: Blood Clot in a Deep Vein   carvedilol 6.25 MG tablet Commonly known as: COREG Take 1 tablet (6.25 mg total) by mouth 2 (two) times daily with a meal. What changed:   medication strength  how much to take  Indication: High Blood Pressure Disorder   escitalopram 5 MG tablet Commonly known as: LEXAPRO Take 1 tablet (5 mg total) by mouth daily. Start taking on: November 14, 2019  Indication: Major Depressive Disorder   famotidine 20 MG tablet Commonly known as: PEPCID Take 1 tablet (20 mg total) by mouth 2 (two) times daily.  Indication: Gastroesophageal Reflux Disease   gabapentin 300 MG capsule Commonly known as: NEURONTIN Take 1 capsule (300 mg total) by mouth 3 (three) times daily. What changed:   medication strength  how much to take  Indication: Pain Following an Operation   insulin aspart 100 UNIT/ML injection Commonly known as: novoLOG Inject 12 Units into the skin 3 (three) times daily with meals. What changed:   how much to take  when to take this  additional instructions  Another medication with the same name was removed. Continue taking this medication, and follow the directions you see here.  Indication: Type 2 Diabetes   insulin glargine 100 UNIT/ML injection Commonly known as: LANTUS Inject 0.25 mLs (25 Units total) into the skin at bedtime. What changed:   how much to take  when to take this  Indication: Type 2 Diabetes   mirtazapine 45 MG tablet Commonly known as: REMERON Take 1 tablet (45 mg total) by mouth at  bedtime.  Indication: Major Depressive Disorder   rosuvastatin 40 MG tablet Commonly known as: CRESTOR Take 1 tablet (40 mg total) by mouth daily at 6 PM.  Indication: High Amount of Fats in the Blood   temazepam 15 MG capsule Commonly known as: RESTORIL Take 1 capsule (15 mg total) by mouth at bedtime.  Indication: Trouble Sleeping   thiamine 100 MG tablet Take 1 tablet (100 mg total) by mouth daily. Start taking on: November 14, 2019  Indication: Loss of Appetite   traZODone 50 MG tablet Commonly known as: DESYREL Take 1 tablet (50 mg total) by mouth at bedtime.  Indication: [facility] Follow up.   Contact information: [place] [id number] [phone]           Follow-up recommendations:  Activity:  Activity as tolerated Diet:  Diabetic diet Other:  Follow-up with outpatient medical and psychiatric care  Comments: Prescriptions given at discharge  Signed: Alethia Berthold, MD 11/13/2019, 3:04 PM

## 2019-11-13 NOTE — Progress Notes (Signed)
PROGRESS NOTE    Richard [name]  [id number] DOB: 05/31/1956 DOA: 11/04/2019 PCP: Patient, No Pcp Per   Brief Narrative:  Richard [name] is a 64 y.o. male with Past medical history of A. fib, CHF, CAD, substance abuse, type II DM. Patient is coming from Home Patient was initially admitted on 10/29/2019 after being found on the ground confused and altered.  He was intubated in the ER for airway protection.  Admitted to the ICU.  Patient was extubated and transferred to floor. After patient was able to tolerate oral diet patient was medically cleared and transferred to behavioral health for ongoing suicidal and homicidal ideation. We were consulted to manage his diabetes.  Subjective: Patient was lying comfortably in his bed. No new complaints. He was anticipating discharge today.  Assessment & Plan:   Principal Problem:   MDD (major depressive disorder) Active Problems:   Diabetes ([place])   HTN (hypertension)   Cocaine abuse ([place])   Below-knee amputation of left lower extremity (H[facility])   PAD (peripheral artery disease) (H[facility])  Type 2 diabetes mellitus, uncontrolled with hyperglycemia. Recently admitted for DKA.  A1c of 14.9.  CBG in 200s -Increase Lantus to 25 units at bedtime. -Continue mealtime coverage with 12 units. -Continue moderate sliding scale. -We will continue to follow.  Hypertension.  Blood pressure within goal -Continue home regimen.  Paroxysmal A. fib. Continue home regimen. Continue Eliquis.   Severe protein calorie malnutrition. Continuing nutritional supplements.  Suicidal ideation. Severe depression. Polysubstance abuse. Management per psychiatry.   Objective: Vitals:   11/11/19 2318 11/12/19 0625 11/12/19 1653 11/13/19 0615  BP: 128/61 (!) 156/84 (!) 150/79 137/76  Pulse: 93 83 85 89  Resp: 18 18  18   Temp: 98.6 F (37 C) 98.8 F (37.1 C)  98.9 F (37.2 C)  TempSrc: Oral Oral  Oral  SpO2: 98% 100%  100%  Height:       No intake or output  data in the 24 hours ending 11/13/19 1529 There were no vitals filed for this visit.  Examination:  General exam: Appears calm and comfortable  Respiratory system: Clear to auscultation. Respiratory effort normal. Cardiovascular system: S1 & S2 heard, RRR. No JVD, murmurs, rubs, gallops or clicks. Gastrointestinal system: Soft, nontender, nondistended, bowel sounds positive. Central nervous system: Alert and oriented. No focal neurological deficits.Symmetric 5 x 5 power. Extremities: No edema, no cyanosis, pulses intact and symmetrical. Right prosthesis due to BKA. Psychiatry: Judgement and insight appear normal. Mood & affect appropriate.    DVT prophylaxis: Eliquis Code Status: Full Family Communication: No family at bedside Disposition Plan: Pending improvement.  Admitted on behavioral health service.  Consultants:   [facility]  Procedures:  Antimicrobials:   Data Reviewed: I have personally reviewed following labs and imaging studies  CBC: Recent Labs  Lab 11/07/19 0736 11/10/19 0637  WBC 9.4 8.8  HGB 9.9* 9.4*  HCT 31.8* 30.1*  MCV 90.9 90.7  PLT 548* [id number]*   Basic Metabolic Panel: Recent Labs  Lab 11/06/19 2134 11/07/19 0736 11/10/19 0637  GLUCOSE 469*  --   --   CREATININE  --  0.89 0.78   GFR: Estimated Creatinine Clearance: 99.4 mL/min (by C-G formula based on SCr of 0.78 mg/dL). Liver Function Tests: No results for input(s): AST, ALT, ALKPHOS, BILITOT, PROT, ALBUMIN in the last 168 hours. No results for input(s): LIPASE, AMYLASE in the last 168 hours. No results for input(s): AMMONIA in the last 168 hours. Coagulation Profile: No results for input(s): INR, PROTIME in  the last 168 hours. Cardiac Enzymes: No results for input(s): CKTOTAL, CKMB, CKMBINDEX, TROPONINI in the last 168 hours. BNP (last 3 results) No results for input(s): PROBNP in the last 8760 hours. HbA1C: No results for input(s): HGBA1C in the last 72 hours. CBG: Recent Labs  Lab  11/12/19 1135 11/12/19 1607 11/12/19 2100 11/13/19 0658 11/13/19 1128  GLUCAP 200* 228* 320* 284* 232*   Lipid Profile: No results for input(s): CHOL, HDL, LDLCALC, TRIG, CHOLHDL, LDLDIRECT in the last 72 hours. Thyroid Function Tests: No results for input(s): TSH, T4TOTAL, FREET4, T3FREE, THYROIDAB in the last 72 hours. Anemia Panel: No results for input(s): VITAMINB12, FOLATE, FERRITIN, TIBC, IRON, RETICCTPCT in the last 72 hours. Sepsis Labs: No results for input(s): PROCALCITON, LATICACIDVEN in the last 168 hours.  No results found for this or any previous visit (from the past 240 hour(s)).   Radiology Studies: No results found.  Scheduled Meds: . apixaban  5 mg Oral Q12H  . carvedilol  6.25 mg Oral BID WC  . escitalopram  5 mg Oral Daily  . famotidine  20 mg Oral BID  . feeding supplement (GLUCERNA SHAKE)  237 mL Oral BID BM  . folic acid  1 mg Oral Daily  . gabapentin  300 mg Oral TID  . insulin aspart  0-15 Units Subcutaneous TID WC  . insulin aspart  0-5 Units Subcutaneous QHS  . insulin aspart  12 Units Subcutaneous TID WC  . insulin glargine  25 Units Subcutaneous QHS  . mirtazapine  45 mg Oral QHS  . multivitamin with minerals  1 tablet Oral Daily  . rosuvastatin  40 mg Oral q1800  . temazepam  15 mg Oral QHS  . thiamine  100 mg Oral Daily  . traZODone  50 mg Oral QHS   Continuous Infusions:   LOS: 9 days   Time spent: 30 minutes  Lorella Nimrod, MD [facility] Pager [phone]  If 7PM-7AM, please contact night-coverage [email or web address] Password [facility] 11/13/2019, 3:29 PM   This record has been created using Dragon voice recognition software. Errors have been sought and corrected,but may not always be located. Such creation errors do not reflect on the standard of care.

## 2019-11-13 NOTE — Progress Notes (Signed)
Recreation Therapy Notes  INPATIENT RECREATION TR PLAN  Patient Details Name: Richard [name] MRN: [id number] DOB: 04/01/1956 Today's Date: 11/13/2019  Rec Therapy Plan Is patient appropriate for Therapeutic Recreation?: Yes Treatment times per week: at least 3 Estimated Length of Stay: 5-7 days TR Treatment/Interventions: Group participation (Comment)  Discharge Criteria Pt will be discharged from therapy if:: Discharged Treatment plan/goals/alternatives discussed and agreed upon by:: Patient/family  Discharge Summary Short term goals set: Patient will engage in groups without prompting or encouragement from LRT x3 group sessions within 5 recreation therapy group sessions Short term goals met: Other (Comment), Adequate for discharge Progress toward goals comments: Groups attended Which groups?: Other (Comment)(Relaxation, Strengths) Reason goals not met: N/A Therapeutic equipment acquired: N/A Reason patient discharged from therapy: Discharge from hospital Pt/family agrees with progress & goals achieved: Yes Date patient discharged from therapy: 11/13/19   Marquis Diles 11/13/2019, 12:33 PM

## 2019-11-13 NOTE — Plan of Care (Signed)
  Problem: Group Participation Goal: STG - Patient will engage in groups without prompting or encouragement from LRT x3 group sessions within 5 recreation therapy group sessions Description: STG - Patient will engage in groups without prompting or encouragement from LRT x3 group sessions within 5 recreation therapy group sessions 11/13/2019 1233 by Ernest Haber, LRT Outcome: Adequate for Discharge 11/13/2019 1233 by Ernest Haber, LRT Outcome: Adequate for Discharge

## 2019-11-13 NOTE — Plan of Care (Signed)
Patient compliant with medication administration per MD   Problem: Medication: Goal: Compliance with prescribed medication regimen will improve Outcome: Progressing

## 2019-11-13 NOTE — Progress Notes (Signed)
Patient denies SI/HI, denies A/V hallucinations. Patient verbalizes understanding of discharge instructions, follow up care and prescriptions. Patient given all belongings from [facility] locker. Patient escorted out in wheelchair  by staff, transported by cab.

## 2019-11-13 NOTE — Plan of Care (Signed)
Patient  knowledgeable    of information  receive regarding [facility]  and unit programing .  Patient able to vent frustration  appropriately  and demonstrate self controled,  able to come to staff for  any concerns .Marland Kitchen Able to state medication  received and knowledgeable of their  usage . Aware of placement concerns    Problem: Education: Goal: Knowledge of  General Education information/materials will improve Outcome: Progressing   Problem: Coping: Goal: Ability to verbalize frustrations and anger appropriately will improve Outcome: Progressing Goal: Ability to demonstrate self-control will improve Outcome: Progressing   Problem: Safety: Goal: Periods of time without injury will increase Outcome: Progressing   Problem: Education: Goal: Knowledge of the prescribed therapeutic regimen will improve Outcome: Progressing   Problem: Medication: Goal: Compliance with prescribed medication regimen will improve Outcome: Progressing

## 2019-11-14 ENCOUNTER — Other Ambulatory Visit: Payer: Self-pay | Admitting: Psychiatry

## 2019-12-06 ENCOUNTER — Emergency Department: Payer: Medicare Other

## 2019-12-06 ENCOUNTER — Encounter: Payer: Self-pay | Admitting: Emergency Medicine

## 2019-12-06 ENCOUNTER — Inpatient Hospital Stay
Admission: EM | Admit: 2019-12-06 | Discharge: 2019-12-21 | DRG: 870 | Disposition: E | Payer: Medicare Other | Attending: Pulmonary Disease | Admitting: Pulmonary Disease

## 2019-12-06 ENCOUNTER — Inpatient Hospital Stay
Admission: AD | Admit: 2019-12-06 | Payer: Medicare Other | Source: Other Acute Inpatient Hospital | Admitting: Pulmonary Disease

## 2019-12-06 ENCOUNTER — Other Ambulatory Visit: Payer: Self-pay

## 2019-12-06 DIAGNOSIS — T424X2A Poisoning by benzodiazepines, intentional self-harm, initial encounter: Secondary | ICD-10-CM | POA: Diagnosis present

## 2019-12-06 DIAGNOSIS — E1110 Type 2 diabetes mellitus with ketoacidosis without coma: Secondary | ICD-10-CM | POA: Diagnosis present

## 2019-12-06 DIAGNOSIS — Z794 Long term (current) use of insulin: Secondary | ICD-10-CM

## 2019-12-06 DIAGNOSIS — J9602 Acute respiratory failure with hypercapnia: Secondary | ICD-10-CM | POA: Diagnosis present

## 2019-12-06 DIAGNOSIS — A419 Sepsis, unspecified organism: Secondary | ICD-10-CM | POA: Diagnosis not present

## 2019-12-06 DIAGNOSIS — N179 Acute kidney failure, unspecified: Secondary | ICD-10-CM | POA: Diagnosis present

## 2019-12-06 DIAGNOSIS — Z8507 Personal history of malignant neoplasm of pancreas: Secondary | ICD-10-CM

## 2019-12-06 DIAGNOSIS — R571 Hypovolemic shock: Secondary | ICD-10-CM | POA: Diagnosis present

## 2019-12-06 DIAGNOSIS — Z681 Body mass index (BMI) 19 or less, adult: Secondary | ICD-10-CM | POA: Diagnosis not present

## 2019-12-06 DIAGNOSIS — E11649 Type 2 diabetes mellitus with hypoglycemia without coma: Secondary | ICD-10-CM | POA: Diagnosis present

## 2019-12-06 DIAGNOSIS — J9600 Acute respiratory failure, unspecified whether with hypoxia or hypercapnia: Secondary | ICD-10-CM

## 2019-12-06 DIAGNOSIS — I469 Cardiac arrest, cause unspecified: Secondary | ICD-10-CM | POA: Diagnosis present

## 2019-12-06 DIAGNOSIS — I2510 Atherosclerotic heart disease of native coronary artery without angina pectoris: Secondary | ICD-10-CM | POA: Diagnosis present

## 2019-12-06 DIAGNOSIS — Z955 Presence of coronary angioplasty implant and graft: Secondary | ICD-10-CM

## 2019-12-06 DIAGNOSIS — J690 Pneumonitis due to inhalation of food and vomit: Secondary | ICD-10-CM | POA: Diagnosis present

## 2019-12-06 DIAGNOSIS — G931 Anoxic brain damage, not elsewhere classified: Secondary | ICD-10-CM | POA: Diagnosis present

## 2019-12-06 DIAGNOSIS — Z515 Encounter for palliative care: Secondary | ICD-10-CM

## 2019-12-06 DIAGNOSIS — F17210 Nicotine dependence, cigarettes, uncomplicated: Secondary | ICD-10-CM | POA: Diagnosis present

## 2019-12-06 DIAGNOSIS — I110 Hypertensive heart disease with heart failure: Secondary | ICD-10-CM | POA: Diagnosis present

## 2019-12-06 DIAGNOSIS — Z7901 Long term (current) use of anticoagulants: Secondary | ICD-10-CM

## 2019-12-06 DIAGNOSIS — M549 Dorsalgia, unspecified: Secondary | ICD-10-CM | POA: Diagnosis present

## 2019-12-06 DIAGNOSIS — I5021 Acute systolic (congestive) heart failure: Secondary | ICD-10-CM | POA: Diagnosis present

## 2019-12-06 DIAGNOSIS — M79606 Pain in leg, unspecified: Secondary | ICD-10-CM | POA: Diagnosis present

## 2019-12-06 DIAGNOSIS — Z046 Encounter for general psychiatric examination, requested by authority: Secondary | ICD-10-CM

## 2019-12-06 DIAGNOSIS — T405X2A Poisoning by cocaine, intentional self-harm, initial encounter: Secondary | ICD-10-CM | POA: Diagnosis present

## 2019-12-06 DIAGNOSIS — Z9911 Dependence on respirator [ventilator] status: Secondary | ICD-10-CM

## 2019-12-06 DIAGNOSIS — Z7189 Other specified counseling: Secondary | ICD-10-CM

## 2019-12-06 DIAGNOSIS — I4891 Unspecified atrial fibrillation: Secondary | ICD-10-CM | POA: Diagnosis present

## 2019-12-06 DIAGNOSIS — J9690 Respiratory failure, unspecified, unspecified whether with hypoxia or hypercapnia: Secondary | ICD-10-CM

## 2019-12-06 DIAGNOSIS — E875 Hyperkalemia: Secondary | ICD-10-CM | POA: Diagnosis present

## 2019-12-06 DIAGNOSIS — Z8249 Family history of ischemic heart disease and other diseases of the circulatory system: Secondary | ICD-10-CM

## 2019-12-06 DIAGNOSIS — Z20822 Contact with and (suspected) exposure to covid-19: Secondary | ICD-10-CM | POA: Diagnosis present

## 2019-12-06 DIAGNOSIS — Z833 Family history of diabetes mellitus: Secondary | ICD-10-CM

## 2019-12-06 DIAGNOSIS — F1410 Cocaine abuse, uncomplicated: Secondary | ICD-10-CM | POA: Diagnosis present

## 2019-12-06 DIAGNOSIS — E1140 Type 2 diabetes mellitus with diabetic neuropathy, unspecified: Secondary | ICD-10-CM | POA: Diagnosis present

## 2019-12-06 DIAGNOSIS — I639 Cerebral infarction, unspecified: Secondary | ICD-10-CM | POA: Diagnosis not present

## 2019-12-06 DIAGNOSIS — Z66 Do not resuscitate: Secondary | ICD-10-CM

## 2019-12-06 DIAGNOSIS — Z79899 Other long term (current) drug therapy: Secondary | ICD-10-CM

## 2019-12-06 DIAGNOSIS — J9601 Acute respiratory failure with hypoxia: Secondary | ICD-10-CM | POA: Diagnosis present

## 2019-12-06 DIAGNOSIS — E1151 Type 2 diabetes mellitus with diabetic peripheral angiopathy without gangrene: Secondary | ICD-10-CM | POA: Diagnosis present

## 2019-12-06 DIAGNOSIS — Z89512 Acquired absence of left leg below knee: Secondary | ICD-10-CM

## 2019-12-06 DIAGNOSIS — E43 Unspecified severe protein-calorie malnutrition: Secondary | ICD-10-CM | POA: Diagnosis present

## 2019-12-06 DIAGNOSIS — R6521 Severe sepsis with septic shock: Secondary | ICD-10-CM | POA: Diagnosis present

## 2019-12-06 DIAGNOSIS — R570 Cardiogenic shock: Secondary | ICD-10-CM | POA: Diagnosis present

## 2019-12-06 DIAGNOSIS — G8929 Other chronic pain: Secondary | ICD-10-CM | POA: Diagnosis present

## 2019-12-06 LAB — COMPREHENSIVE METABOLIC PANEL
ALT: 10 U/L (ref 0–44)
ALT: 13 U/L (ref 0–44)
AST: 15 U/L (ref 15–41)
AST: 28 U/L (ref 15–41)
Albumin: 2 g/dL — ABNORMAL LOW (ref 3.5–5.0)
Albumin: 2.6 g/dL — ABNORMAL LOW (ref 3.5–5.0)
Alkaline Phosphatase: 21 U/L — ABNORMAL LOW (ref 38–126)
Alkaline Phosphatase: 27 U/L — ABNORMAL LOW (ref 38–126)
Anion gap: 11 (ref 5–15)
Anion gap: 17 — ABNORMAL HIGH (ref 5–15)
BUN: 17 mg/dL (ref 8–23)
BUN: 21 mg/dL (ref 8–23)
CO2: 17 mmol/L — ABNORMAL LOW (ref 22–32)
CO2: 19 mmol/L — ABNORMAL LOW (ref 22–32)
Calcium: 6 mg/dL — CL (ref 8.9–10.3)
Calcium: 8.4 mg/dL — ABNORMAL LOW (ref 8.9–10.3)
Chloride: 114 mmol/L — ABNORMAL HIGH (ref 98–111)
Chloride: 100 mmol/L (ref 98–111)
Creatinine, Ser: 1.11 mg/dL (ref 0.61–1.24)
Creatinine, Ser: 1.55 mg/dL — ABNORMAL HIGH (ref 0.61–1.24)
GFR calc Af Amer: >60 mL/min (ref >60)
GFR calc Af Amer: 54 mL/min — ABNORMAL LOW (ref >60)
GFR calc non Af Amer: >60 mL/min (ref >60)
GFR calc non Af Amer: 47 mL/min — ABNORMAL LOW (ref >60)
Glucose, Bld: 625 mg/dL (ref 70–99)
Glucose, Bld: 807 mg/dL (ref 70–99)
Potassium: 2.6 mmol/L — CL (ref 3.5–5.1)
Potassium: 4.2 mmol/L (ref 3.5–5.1)
Sodium: 142 mmol/L (ref 135–145)
Sodium: 136 mmol/L (ref 135–145)
Total Bilirubin: 0.9 mg/dL (ref 0.3–1.2)
Total Bilirubin: 1 mg/dL (ref 0.3–1.2)
Total Protein: 4.4 g/dL — ABNORMAL LOW (ref 6.5–8.1)
Total Protein: 5.8 g/dL — ABNORMAL LOW (ref 6.5–8.1)

## 2019-12-06 LAB — CBC WITH DIFFERENTIAL/PLATELET
Abs Immature Granulocytes: 0.01 10*3/uL (ref 0.00–0.07)
Basophils Absolute: 0 10*3/uL (ref 0.0–0.1)
Basophils Relative: 1 %
Eosinophils Absolute: 0 10*3/uL (ref 0.0–0.5)
Eosinophils Relative: 0 %
HCT: 28.7 % — ABNORMAL LOW (ref 39.0–52.0)
Hemoglobin: 8.8 g/dL — ABNORMAL LOW (ref 13.0–17.0)
Immature Granulocytes: 0 %
Lymphocytes Relative: 12 %
Lymphs Abs: 0.5 10*3/uL — ABNORMAL LOW (ref 0.7–4.0)
MCH: 27.8 pg (ref 26.0–34.0)
MCHC: 30.7 g/dL (ref 30.0–36.0)
MCV: 90.8 fL (ref 80.0–100.0)
Monocytes Absolute: 0.2 10*3/uL (ref 0.1–1.0)
Monocytes Relative: 4 %
Neutro Abs: 3.6 10*3/uL (ref 1.7–7.7)
Neutrophils Relative %: 83 %
Platelets: 169 10*3/uL (ref 150–400)
RBC: 3.16 MIL/uL — ABNORMAL LOW (ref 4.22–5.81)
RDW: 14.8 % (ref 11.5–15.5)
Smear Review: NORMAL
WBC: 4.3 10*3/uL (ref 4.0–10.5)
nRBC: 0 % (ref 0.0–0.2)

## 2019-12-06 LAB — BASIC METABOLIC PANEL
Anion gap: 17 — ABNORMAL HIGH (ref 5–15)
Anion gap: 5 (ref 5–15)
BUN: 22 mg/dL (ref 8–23)
BUN: 15 mg/dL (ref 8–23)
CO2: 19 mmol/L — ABNORMAL LOW (ref 22–32)
CO2: 12 mmol/L — ABNORMAL LOW (ref 22–32)
Calcium: 8.4 mg/dL — ABNORMAL LOW (ref 8.9–10.3)
Calcium: 4.4 mg/dL — CL (ref 8.9–10.3)
Chloride: 101 mmol/L (ref 98–111)
Chloride: 123 mmol/L — ABNORMAL HIGH (ref 98–111)
Creatinine, Ser: 1.7 mg/dL — ABNORMAL HIGH (ref 0.61–1.24)
Creatinine, Ser: 0.88 mg/dL (ref 0.61–1.24)
GFR calc Af Amer: 48 mL/min — ABNORMAL LOW (ref >60)
GFR calc Af Amer: >60 mL/min (ref >60)
GFR calc non Af Amer: 42 mL/min — ABNORMAL LOW (ref >60)
GFR calc non Af Amer: >60 mL/min (ref >60)
Glucose, Bld: 806 mg/dL (ref 70–99)
Glucose, Bld: 416 mg/dL — ABNORMAL HIGH (ref 70–99)
Potassium: 4.9 mmol/L (ref 3.5–5.1)
Potassium: 5.9 mmol/L — ABNORMAL HIGH (ref 3.5–5.1)
Sodium: 137 mmol/L (ref 135–145)
Sodium: 140 mmol/L (ref 135–145)

## 2019-12-06 LAB — RESPIRATORY PANEL BY RT PCR (FLU A&B, COVID)
Influenza A by PCR: NEGATIVE
Influenza B by PCR: NEGATIVE
SARS Coronavirus 2 by RT PCR: NEGATIVE

## 2019-12-06 LAB — URINE DRUG SCREEN, QUALITATIVE (ARMC ONLY)
Amphetamines, Ur Screen: NOT DETECTED
Barbiturates, Ur Screen: NOT DETECTED
Benzodiazepine, Ur Scrn: POSITIVE — AB
Cannabinoid 50 Ng, Ur ~~LOC~~: NOT DETECTED
Cocaine Metabolite,Ur ~~LOC~~: POSITIVE — AB
MDMA (Ecstasy)Ur Screen: NOT DETECTED
Methadone Scn, Ur: NOT DETECTED
Opiate, Ur Screen: NOT DETECTED
Phencyclidine (PCP) Ur S: NOT DETECTED
Tricyclic, Ur Screen: NOT DETECTED

## 2019-12-06 LAB — BLOOD GAS, ARTERIAL
Acid-base deficit: 11.4 mmol/L — ABNORMAL HIGH (ref 0.0–2.0)
Bicarbonate: 18.9 mmol/L — ABNORMAL LOW (ref 20.0–28.0)
FIO2: 1
MECHVT: 500 mL
O2 Saturation: 93.8 %
PEEP: 5 cmH2O
Patient temperature: 34.3
RATE: 16 resp/min
pCO2 arterial: 54 mmHg — ABNORMAL HIGH (ref 32.0–48.0)
pH, Arterial: 7.13 — CL (ref 7.350–7.450)
pO2, Arterial: 80 mmHg — ABNORMAL LOW (ref 83.0–108.0)

## 2019-12-06 LAB — URINALYSIS, COMPLETE (UACMP) WITH MICROSCOPIC
Bilirubin Urine: NEGATIVE
Glucose, UA: >=500 mg/dL — AB
Ketones, ur: 5 mg/dL — AB
Leukocytes,Ua: NEGATIVE
Nitrite: NEGATIVE
Protein, ur: NEGATIVE mg/dL
Specific Gravity, Urine: 1.024 (ref 1.005–1.030)
Squamous Epithelial / HPF: NONE SEEN (ref 0–5)
pH: 6 (ref 5.0–8.0)

## 2019-12-06 LAB — GLUCOSE, CAPILLARY
Glucose-Capillary: >600 mg/dL (ref 70–99)
Glucose-Capillary: >600 mg/dL (ref 70–99)
Glucose-Capillary: >600 mg/dL (ref 70–99)
Glucose-Capillary: >600 mg/dL (ref 70–99)
Glucose-Capillary: >600 mg/dL (ref 70–99)
Glucose-Capillary: >600 mg/dL (ref 70–99)
Glucose-Capillary: >600 mg/dL (ref 70–99)
Glucose-Capillary: >600 mg/dL (ref 70–99)
Glucose-Capillary: >600 mg/dL (ref 70–99)
Glucose-Capillary: >600 mg/dL (ref 70–99)

## 2019-12-06 LAB — SALICYLATE LEVEL: Salicylate Lvl: <7 mg/dL — ABNORMAL LOW (ref 7.0–30.0)

## 2019-12-06 LAB — TROPONIN I (HIGH SENSITIVITY)
Troponin I (High Sensitivity): 18 ng/L — ABNORMAL HIGH (ref <18)
Troponin I (High Sensitivity): 22 ng/L — ABNORMAL HIGH (ref <18)

## 2019-12-06 LAB — LACTIC ACID, PLASMA
Lactic Acid, Venous: 3.5 mmol/L (ref 0.5–1.9)
Lactic Acid, Venous: 3.8 mmol/L (ref 0.5–1.9)
Lactic Acid, Venous: 4.1 mmol/L (ref 0.5–1.9)
Lactic Acid, Venous: 5.2 mmol/L (ref 0.5–1.9)

## 2019-12-06 LAB — ACETAMINOPHEN LEVEL: Acetaminophen (Tylenol), Serum: <10 ug/mL — ABNORMAL LOW (ref 10–30)

## 2019-12-06 LAB — BETA-HYDROXYBUTYRIC ACID: Beta-Hydroxybutyric Acid: 4.27 mmol/L — ABNORMAL HIGH (ref 0.05–0.27)

## 2019-12-06 LAB — PHOSPHORUS: Phosphorus: 7.5 mg/dL — ABNORMAL HIGH (ref 2.5–4.6)

## 2019-12-06 LAB — AMMONIA: Ammonia: 29 umol/L (ref 9–35)

## 2019-12-06 LAB — MAGNESIUM: Magnesium: 1.8 mg/dL (ref 1.7–2.4)

## 2019-12-06 MED ORDER — MIDAZOLAM 50MG/50ML (1MG/ML) PREMIX INFUSION
0.5000 mg/h | INTRAVENOUS | Status: DC
  Administered 2019-12-06: 0.5 mg/h via INTRAVENOUS
  Administered 2019-12-07: 3 mg/h via INTRAVENOUS
  Filled 2019-12-06 (×2): qty 50

## 2019-12-06 MED ORDER — DEXTROSE IN LACTATED RINGERS 5 % IV SOLN: INTRAVENOUS | Status: DC

## 2019-12-06 MED ORDER — METRONIDAZOLE IN NACL 5-0.79 MG/ML-% IV SOLN
500.0000 mg | Freq: Once | INTRAVENOUS | Status: AC
  Administered 2019-12-06: 500 mg via INTRAVENOUS
  Filled 2019-12-06: qty 100

## 2019-12-06 MED ORDER — VANCOMYCIN HCL IN DEXTROSE 1-5 GM/200ML-% IV SOLN
1000.0000 mg | Freq: Once | INTRAVENOUS | Status: AC
  Administered 2019-12-06: 1000 mg via INTRAVENOUS
  Filled 2019-12-06: qty 200

## 2019-12-06 MED ORDER — POTASSIUM PHOSPHATES 15 MMOLE/5ML IV SOLN: 30.0000 mmol | Freq: Once | INTRAVENOUS | Status: DC

## 2019-12-06 MED ORDER — FENTANYL 2500MCG IN NS 250ML (10MCG/ML) PREMIX INFUSION
20.0000 ug/h | INTRAVENOUS | Status: DC
  Administered 2019-12-06: 25 ug/h via INTRAVENOUS
  Administered 2019-12-07: 150 ug/h via INTRAVENOUS
  Filled 2019-12-06 (×2): qty 250

## 2019-12-06 MED ORDER — POTASSIUM CHLORIDE 10 MEQ/100ML IV SOLN
10.0000 meq | INTRAVENOUS | Status: AC
  Administered 2019-12-06 (×2): 10 meq via INTRAVENOUS
  Filled 2019-12-06 (×2): qty 100

## 2019-12-06 MED ORDER — NOREPINEPHRINE 4 MG/250ML-% IV SOLN
INTRAVENOUS | Status: AC
  Filled 2019-12-06: qty 250

## 2019-12-06 MED ORDER — SODIUM CHLORIDE 0.9 % IV SOLN
250.0000 mL | INTRAVENOUS | Status: DC
  Administered 2019-12-06: 250 mL via INTRAVENOUS

## 2019-12-06 MED ORDER — INSULIN REGULAR(HUMAN) IN NACL 100-0.9 UT/100ML-% IV SOLN
INTRAVENOUS | Status: DC
  Administered 2019-12-06: 5 [IU]/h via INTRAVENOUS
  Administered 2019-12-07: 10.5 [IU]/h via INTRAVENOUS
  Filled 2019-12-06 (×2): qty 100

## 2019-12-06 MED ORDER — FAMOTIDINE IN NACL 20-0.9 MG/50ML-% IV SOLN
20.0000 mg | Freq: Two times a day (BID) | INTRAVENOUS | Status: DC
  Administered 2019-12-07 (×2): 20 mg via INTRAVENOUS
  Filled 2019-12-06 (×2): qty 50

## 2019-12-06 MED ORDER — LACTATED RINGERS IV SOLN: INTRAVENOUS | Status: DC

## 2019-12-06 MED ORDER — DEXTROSE 50 % IV SOLN: 0.0000 mL | INTRAVENOUS | Status: DC | PRN

## 2019-12-06 MED ORDER — SODIUM CHLORIDE 0.9 % IV BOLUS
1000.0000 mL | Freq: Once | INTRAVENOUS | Status: AC
  Administered 2019-12-06: 14:00:00 1000 mL via INTRAVENOUS

## 2019-12-06 MED ORDER — NOREPINEPHRINE 4 MG/250ML-% IV SOLN
0.0000 ug/min | INTRAVENOUS | Status: DC
  Administered 2019-12-06: 2 ug/min via INTRAVENOUS
  Administered 2019-12-07: 30 ug/min via INTRAVENOUS
  Filled 2019-12-06 (×4): qty 250

## 2019-12-06 MED ORDER — INSULIN ASPART 100 UNIT/ML ~~LOC~~ SOLN: 10.0000 [IU] | Freq: Once | SUBCUTANEOUS | Status: DC

## 2019-12-06 MED ORDER — SODIUM CHLORIDE 0.9 % IV SOLN
2.0000 g | Freq: Once | INTRAVENOUS | Status: AC
  Administered 2019-12-06: 2 g via INTRAVENOUS
  Filled 2019-12-06: qty 2

## 2019-12-06 MED ORDER — SODIUM CHLORIDE 0.9 % IV SOLN: 1.0000 g | Freq: Once | INTRAVENOUS | Status: DC

## 2019-12-06 MED ORDER — ROCURONIUM BROMIDE 50 MG/5ML IV SOLN
INTRAVENOUS | Status: AC | PRN
  Administered 2019-12-06: 60 mg via INTRAVENOUS

## 2019-12-06 MED ORDER — ALBUMIN HUMAN 25 % IV SOLN
12.5000 g | Freq: Once | INTRAVENOUS | Status: AC
  Administered 2019-12-06: 18:00:00 12.5 g via INTRAVENOUS
  Filled 2019-12-06: qty 50

## 2019-12-06 MED ORDER — POTASSIUM CHLORIDE 2 MEQ/ML IV SOLN
INTRAVENOUS | Status: DC
  Filled 2019-12-06 (×2): qty 1000

## 2019-12-06 MED ORDER — SODIUM CHLORIDE 0.9 % IV SOLN
3.0000 g | Freq: Four times a day (QID) | INTRAVENOUS | Status: DC
  Administered 2019-12-06 – 2019-12-07 (×2): 3 g via INTRAVENOUS
  Filled 2019-12-06 (×2): qty 8

## 2019-12-06 MED ORDER — SODIUM CHLORIDE 0.9 % IV BOLUS
2000.0000 mL | Freq: Once | INTRAVENOUS | Status: AC
  Administered 2019-12-06: 2000 mL via INTRAVENOUS

## 2019-12-06 MED ORDER — PROPOFOL 1000 MG/100ML IV EMUL
INTRAVENOUS | Status: AC
  Filled 2019-12-06: qty 100

## 2019-12-06 MED ORDER — CALCIUM GLUCONATE-NACL 1-0.675 GM/50ML-% IV SOLN
1.0000 g | Freq: Once | INTRAVENOUS | Status: AC
  Administered 2019-12-06: 16:00:00 1000 mg via INTRAVENOUS
  Filled 2019-12-06: qty 50

## 2019-12-06 MED ORDER — ETOMIDATE 2 MG/ML IV SOLN
INTRAVENOUS | Status: AC | PRN
  Administered 2019-12-06: 15 mg via INTRAVENOUS

## 2019-12-06 MED ORDER — MAGNESIUM SULFATE 2 GM/50ML IV SOLN
2.0000 g | Freq: Once | INTRAVENOUS | Status: AC
  Administered 2019-12-06: 2 g via INTRAVENOUS
  Filled 2019-12-06: qty 50

## 2019-12-06 NOTE — Progress Notes (Signed)
CODE SEPSIS - PHARMACY COMMUNICATION  **Broad Spectrum Antibiotics should be administered within 1 hour of Sepsis diagnosis**  Time Code Sepsis Called/Page Received: @ 1347   Antibiotics Ordered: Cefepime and Vancomycin   Time of 1st antibiotic administration: @ [place], PharmD, BCPS Clinical Pharmacist 12/15/2019 1:58 PM

## 2019-12-06 NOTE — ED Triage Notes (Addendum)
Pt via EMS from home. Pt was found unresponsive on the floor and found with vomit on him. Pt LKW was 1030 last night. Pt CBG was 554 per EMS. EMS states that pt was 56% on RA. On arrival, pt is on NRB 15L and O2 sat is 86%. Pt is currently unresponsive   Roderic Palau, [place] at bedside. RT at bedside to place pt on BiPap

## 2019-12-06 NOTE — ED Notes (Signed)
Pt's blood pressure at this time trending down, blood pressure as low as 79/59.

## 2019-12-06 NOTE — Progress Notes (Signed)
Pharmacy Antibiotic Note  Richard [name] is a 64 y.o. male admitted on 11/26/2019 with cardiac arrest and respiratory failure secondary to suspected intentional overdose.cardiac arrest and respiratory failure secondary to suspected intentional overdose. Pharmacy has been consulted for Unasyn dosing for possible aspiration event.   Plan: Unasyn 3g IV Q6hr.    Height: 6' (182.9 cm) Weight: 110 lb (49.9 kg) IBW/kg (Calculated) : 77.6  Temp (24hrs), Avg:95 F (35 C), Min:93.3 F (34.1 C), Max:99 F (37.2 C)  Recent Labs  Lab 12/03/2019 1309 12/03/2019 1459  WBC  --  4.3  CREATININE  --  1.55*  1.11  LATICACIDVEN 3.5* 3.8*    Estimated Creatinine Clearance: 47.5 mL/min (by C-G formula based on SCr of 1.11 mg/dL).    No Known Allergies  Antimicrobials this admission: Cefepime, Metronidazole, Vancomycin 2/14 x 1.  Unasyn 2/14 >>   Dose adjustments this admission: N/A   Microbiology results: 2/14 BCx: pending  2/14 Influenza A/B: negative  2/14 Coronavirus PCR: negative   Thank you for allowing pharmacy to be a part of this patient's care.  [name] L 12/07/2019 8:38 PM

## 2019-12-06 NOTE — ED Notes (Signed)
Patient has been cleaned and changed at this time. 

## 2019-12-06 NOTE — ED Notes (Signed)
Bear hugger removed from patient due to pt's temp being 99.2 via temp foley.

## 2019-12-06 NOTE — ED Notes (Signed)
Pt being transported to CT with kate RN, defib monitor and RT.  Remains on pressors and sedation.

## 2019-12-06 NOTE — Consult Note (Addendum)
CRITICAL CARE PROGRESS NOTE    Name: Richard [name] MRN: [id number] DOB: [birth year]     LOS: 0  Referring physician: Delman Kitten MD  SUBJECTIVE FINDINGS & SIGNIFICANT EVENTS   Patient description:  This is a 64 year old with a history of atrial flutter, essential hypertension peripheral artery disease, left lower extremity BKA with prosthesis, diabetic status post DKA and HHS admission, came in to the ED due to respiratory cause of cardiac arrest.  Apparently per family patient had psychiatric history with previous suicidal attempt, thought to have possibly overdosed on something and had vomiting day of admission and again in route to ED.  He was found to have chest x-ray with bilateral infiltrates and impressive right lower lobe infiltrate suggestive of large volume aspiration pneumonia.  Critical care was consulted by emergency medicine Dr. Delman Kitten for further evaluation management  Lines / Drains: PIV x3   Cultures / Sepsis markers: COVID-19 negative, influenza AMB negative Blood culture pending Urine drug screen positive for cocaine and benzodiazepine  Antibiotics: Flagyl and vancomycin x1 in ED     PAST MEDICAL HISTORY   Past Medical History:  Diagnosis Date   Atrial fibrillation ([place])    per patient   Cancer ([place])    CHF (congestive heart failure) ([place])    Chronic back pain    Chronic leg pain    Coronary artery disease    Diabetes mellitus without complication ([place])    Hypertension    Neuropathy      SURGICAL HISTORY   Past Surgical History:  Procedure Laterality Date   AMPUTATION Left 03/13/2019   Procedure: AMPUTATION BELOW KNEE;  Surgeon: Algernon Huxley, MD;  Location: [facility] ORS;  Service: Vascular;  Laterality: Left;   CORONARY ANGIOPLASTY WITH STENT PLACEMENT      EMBOLECTOMY Left 03/12/2019   Procedure: EMBOLECTOMY SFA POPLITEAL;  Surgeon: Algernon Huxley, MD;  Location: [facility] ORS;  Service: Vascular;  Laterality: Left;   ENDARTERECTOMY FEMORAL Left 03/12/2019   Procedure: ENDARTERECTOMY FEMORAL;  Surgeon: Algernon Huxley, MD;  Location: [facility] ORS;  Service: Vascular;  Laterality: Left;   ESOPHAGOGASTRODUODENOSCOPY N/A 09/11/2017   Procedure: ESOPHAGOGASTRODUODENOSCOPY (EGD);  Surgeon: Virgel Manifold, MD;  Location: [facility] ENDOSCOPY;  Service: Endoscopy;  Laterality: N/A;   LOWER EXTREMITY ANGIOGRAPHY Left 11/17/2018   Procedure: Lower Extremity Angiography, with possible intervention;  Surgeon: Algernon Huxley, MD;  Location: [place] [facility] LAB;  Service: Cardiovascular;  Laterality: Left;   LOWER EXTREMITY ANGIOGRAPHY Left 03/04/2019   Procedure: Lower Extremity Angiography;  Surgeon: Algernon Huxley, MD;  Location: [place] [facility];  Service: Cardiovascular;  Laterality: Left;   LOWER EXTREMITY ANGIOGRAPHY Left 07/01/2019   Procedure: LOWER EXTREMITY ANGIOGRAPHY;  Surgeon: Algernon Huxley, MD;  Location: [place] [facility];  Service: Cardiovascular;  Laterality: Left;   pancreas removed       FAMILY HISTORY   Family History  Problem Relation Age of Onset   CAD Mother    Diabetes Mellitus II Mother    Diabetes Mellitus II Father      SOCIAL HISTORY   Social History   Tobacco Use   Smoking status: Current Every Day Smoker    Packs/day: 0.50    Years: 50.00    Pack years: 25.00   Smokeless tobacco: Never Used  Substance Use Topics   Alcohol use: No    Alcohol/week: 0.0 standard drinks   Drug use: Yes    Types: Cocaine, Benzodiazepines    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     MEDICATIONS   Current Medication:  Current Facility-Administered Medications:    0.9 %  sodium chloride infusion, 250 mL, Intravenous, Continuous, Delman Kitten, MD, Last Rate: 20 mL/hr at 12/16/2019 1417, 250 mL at  12/18/2019 1417   albumin human 25 % solution 12.5 g, 12.5 g, Intravenous, Once, Delman Kitten, MD   calcium gluconate 1 g/ 50 mL sodium chloride IVPB, 1 g, Intravenous, Once, Delman Kitten, MD, Last Rate: 50 mL/hr at 12/17/2019 1607, 1,000 mg at 11/25/2019 1607   fentaNYL 2533mcg in NS 262mL (72mcg/ml) infusion-PREMIX, 20 mcg/hr, Intravenous, Continuous, Delman Kitten, MD, Last Rate: 12.5 mL/hr at 11/26/2019 1555, 125 mcg/hr at 12/20/2019 1555   insulin aspart (novoLOG) injection 10 Units, 10 Units, Intravenous, Once, Delman Kitten, MD   midazolam (VERSED) 50 mg/50 mL (1 mg/mL) premix infusion, 0.5 mg/hr, Intravenous, Continuous, Delman Kitten, MD, Last Rate: 2.5 mL/hr at 12/09/2019 1556, 2.5 mg/hr at 11/25/2019 1556   norepinephrine (LEVOPHED) 4-5 MG/250ML-% infusion SOLN, , , ,    norepinephrine (LEVOPHED) 4mg  in 225mL premix infusion, 2-10 mcg/min, Intravenous, Titrated, Delman Kitten, MD, Last Rate: 45 mL/hr at 12/07/2019 1635, 12 mcg/min at 12/04/2019 1635   propofol (DIPRIVAN) 1000 MG/100ML infusion, , , ,   Current Outpatient Medications:    ACCU-CHEK AVIVA PLUS test strip, U ONCE TO BID UTD, Disp: 100 each, Rfl: 5   Accu-Chek Softclix Lancets lancets, TEST 1-2 XD, Disp: 100 each, Rfl: 5   apixaban (ELIQUIS) 5 MG TABS tablet, Take 1 tablet (5 mg total) by mouth every 12 (twelve) hours., Disp: 60 tablet, Rfl: 1   carvedilol (COREG) 6.25 MG tablet, Take 1 tablet (6.25 mg total) by mouth 2 (two) times daily with a meal., Disp: 60 tablet, Rfl: 1   escitalopram (LEXAPRO) 5 MG tablet, Take 1 tablet (5 mg total) by mouth daily., Disp: 30 tablet, Rfl: 1   famotidine (PEPCID) 20 MG tablet, TAKE 1 TABLET BY MOUTH TWICE DAILY (Patient taking differently: Take 20 mg by mouth 2 (two) times daily. ), Disp: 180 tablet, Rfl: 0   gabapentin (NEURONTIN) 300 MG capsule, Take 1 capsule (300 mg total) by mouth 3 (three) times daily., Disp: 90 capsule, Rfl: 1   insulin aspart (NOVOLOG) 100 UNIT/ML injection, Inject 12 Units  into the skin 3 (three) times daily with meals., Disp: 10 mL, Rfl: 11   insulin glargine (LANTUS) 100 UNIT/ML injection, Inject 0.25 mLs (25 Units total) into the skin at bedtime., Disp: 10 mL, Rfl: 11   mirtazapine (REMERON) 45 MG tablet, Take 1 tablet (45 mg total) by mouth at bedtime., Disp: 30 tablet, Rfl: 1   rosuvastatin (CRESTOR) 40 MG tablet, Take 1 tablet (40 mg total) by mouth daily at 6 PM., Disp: 30 tablet, Rfl: 1   temazepam (RESTORIL) 15 MG capsule, Take 1 capsule (15 mg total) by mouth at bedtime., Disp: 30 capsule, Rfl: 1   thiamine 100 MG tablet, Take 1 tablet (100 mg total) by mouth daily., Disp: 30 tablet, Rfl: 1   traZODone (DESYREL) 50 MG tablet, Take 1 tablet (50 mg total) by mouth at bedtime., Disp: 30 tablet, Rfl: 1    ALLERGIES   Patient has no known allergies.    REVIEW OF SYSTEMS     10 point ROS unable to collect due to mechanical ventilation unresponsive state   PHYSICAL EXAMINATION   Vital Signs: Temp:  [93.3 F (34.1 C)-94.1 F (34.5 C)] 94.1  F (34.5 C) (02/14 1651) Pulse Rate:  [99-130] 111 (02/14 1651) Resp:  [14-26] 26 (02/14 1651) BP: (87-126)/(52-71) 90/58 (02/14 1651) SpO2:  [87 %-100 %] 97 % (02/14 1651) FiO2 (%):  [100 %] 100 % (02/14 1653) Weight:  [49.9 kg] 49.9 kg (02/14 1256)  GENERAL: Age-appropriate HEAD: Normocephalic, atraumatic.  EYES: Pupils equal, round, reactive to light.  No scleral icterus.  MOUTH: Moist mucosal membrane. NECK: Supple. No thyromegaly. No nodules. No JVD.  PULMONARY: Bilateral rhonchorous breath sounds CARDIOVASCULAR: S1 and S2. Regular rate and rhythm. No murmurs, rubs, or gallops.  GASTROINTESTINAL: Soft, nontender, non-distended. No masses. Positive bowel sounds. No hepatosplenomegaly.  Patient defecated on himself MUSCULOSKELETAL: No swelling, clubbing, or edema.  Left lower extremity prosthetic post BKA NEUROLOGIC: GCS 3 T SKIN:intact,warm,dry   PERTINENT DATA     Infusions:  sodium  chloride 250 mL (11/23/2019 1417)   albumin human     calcium gluconate 1,000 mg (12/12/2019 1607)   fentaNYL infusion INTRAVENOUS 125 mcg/hr (12/10/2019 1555)   midazolam 2.5 mg/hr ( 1556)   norepinephrine     norepinephrine (LEVOPHED) Adult infusion 12 mcg/min (12/09/2019 1635)   propofol     Scheduled Medications:  insulin aspart  10 Units Intravenous Once   PRN Medications:  Hemodynamic parameters:   Intake/Output: No intake/output data recorded.  Ventilator  Settings: Vent Mode: AC FiO2 (%):  [100 %] 100 % Set Rate:  [16 bmp-22 bmp] 22 bmp Vt Set:  [500 mL] 500 mL PEEP:  [5 cmH20] 5 cmH20     LAB RESULTS:  Basic Metabolic Panel: Recent Labs  Lab 12/20/2019 1459  NA 142  K 2.6*  CL 114*  CO2 17*  GLUCOSE 625*  BUN 17  CREATININE 1.11  CALCIUM 6.0*   Liver Function Tests: Recent Labs  Lab 11/28/2019 1459  AST 15  ALT 10  ALKPHOS 21*  BILITOT 0.9  PROT 4.4*  ALBUMIN 2.0*   No results for input(s): LIPASE, AMYLASE in the last 168 hours. Recent Labs  Lab  1309  AMMONIA 29   CBC: Recent Labs  Lab 11/24/2019 1459  WBC 4.3  NEUTROABS 3.6  HGB 8.8*  HCT 28.7*  MCV 90.8  PLT 169   Cardiac Enzymes: No results for input(s): CKTOTAL, CKMB, CKMBINDEX, TROPONINI in the last 168 hours. BNP: Invalid input(s): POCBNP CBG: Recent Labs  Lab 12/17/2019 1301 12/15/2019 1632  GLUCAP >600* >600*     IMAGING RESULTS:  Imaging: DG Chest 1 View  Result Date: 12/15/2019 CLINICAL DATA:  Pt via EMS from home. Pt was found unresponsive. Hx of afib, chf, cad EXAM: CHEST  1 VIEW COMPARISON:  Chest radiograph 10/30/2019 FINDINGS: The heart size and mediastinal contours are within normal limits. Aortic arch calcification. There are coarse airspace opacities in the left mid lung and possibly more subtly in the right lower lung suspicious for infection or aspiration. No pneumothorax or significant pleural effusion. No acute finding in the visualized  skeleton. IMPRESSION: Coarse airspace opacities in the left mid lung and possibly in the right lower lung suspicious for infection or aspiration. Electronically Signed   By: Audie Pinto M.D.   On: 12/01/2019 13:36   DG Abdomen 1 View  Result Date: 11/23/2019 CLINICAL DATA:  Status post OG tube placement EXAM: ABDOMEN - 1 VIEW COMPARISON:  Abdominal radiograph 10/29/2019 FINDINGS: Interval placement of a nasogastric tube with side port appropriately positioned over the stomach. The abdomen is largely excluded from field of view. Bilateral airspace opacities better  evaluated on concurrent chest radiograph IMPRESSION: Interval placement of a nasogastric tube with side port appropriately positioned over the stomach. Electronically Signed   By: Audie Pinto M.D.   On: 11/28/2019 16:59   CT Head Wo Contrast  Result Date: 11/29/2019 CLINICAL DATA:  Altered mental status EXAM: CT HEAD WITHOUT CONTRAST TECHNIQUE: Contiguous axial images were obtained from the base of the skull through the vertex without intravenous contrast. COMPARISON:  CT brain 10/29/2019 FINDINGS: Brain: No acute territorial infarction, hemorrhage, or intracranial mass. Mild atrophy. Patchy hypodensity in the white matter consistent with chronic small vessel ischemic change. Stable ventricle size Vascular: No hyperdense vessels. Carotid and vertebral arterial calcification Skull: Normal. Negative for fracture or focal lesion. Sinuses/Orbits: Mucosal thickening in the ethmoid maxillary and sphenoid sinuses Other: None IMPRESSION: 1. No CT evidence for acute intracranial abnormality. 2. Atrophy and mild chronic small vessel ischemic change of the white matter Electronically Signed   By: Donavan Foil M.D.   On: 12/08/2019 16:31   DG Chest Portable 1 View  Result Date: 12/15/2019 CLINICAL DATA:  S/p intubation. EXAM: PORTABLE CHEST 1 VIEW COMPARISON:  Chest radiograph 12/15/2019 FINDINGS: Interval intubation with endotracheal tube tip  between the thoracic inlet and carina. Interval worsening of airspace opacities bilaterally with increased consolidation in the right lower lung concerning for multifocal infection or aspiration. No significant pleural effusion. There is an area of lucency in the peripheral left lung favored to represent a skin fold, less likely a pneumothorax. IMPRESSION: 1. Interval intubation with endotracheal tube tip between the thoracic inlet and carina. 2. Interval worsening of airspace opacities bilaterally with increased consolidation in the right lower lung concerning for multifocal infection or aspiration. 3. Area of lucency in the lateral left lung which appears to have bronchial markings beyond it on the prior radiograph, favored to represent a skin fold, less likely a pneumothorax. Repeat radiograph with patient centered on the film and as many leads/external objects off the chest as possible may be of use for clarification. These results were called by telephone at the time of interpretation on 12/07/2019 at 2:53 pm to provider MARK QUALE , who verbally acknowledged these results. Electronically Signed   By: Audie Pinto M.D.   On: 12/07/2019 14:54      ASSESSMENT AND PLAN    -Multidisciplinary rounds held today  Acute Hypoxic Respiratory Failure   -Likely due to aspiration pneumonia   -Family reports suicide attempt with drug overdose   -Currently remains on involuntary commitment plan for transfer to [facility] due to bed availability in medical intensive care unit -continue Full MV support -continue Bronchodilator Therapy -Wean Fio2 and PEEP as tolerated -Unasyn per pharmacy consult  Cardiac Arrest    - s/p ACLS    - TTM with 36C goal 24h  - CTH with no evidence of edema or loss of G/W differentiation -oxygen as needed -follow up cardiac biomarkers  ICU telemetry monitoring -TTE    Diabetic ketoacidosis  -Follow DKA protocol phase 1 through 3 -Replete potassium prior to initiation  of IV insulin drip -Pharmacy consultation for severe electrolyte disturbances -Status post calcium gluconate 100x1, K-Phos 30 mmol x 1, magnesium 2 g x 1 -CBG every 2 hours   Probable suicide attempt  -Family reports depression with suicidal ideation  -Urine drug screen positive for benzodiazepine and cocaine  -Patient placed on involuntary commitment in ED due to risk of harm to self -Continue supportive care -Psychiatry evaluation  Severe protein calorie malnutrition -Bitemporal wasting  with muscular atrophy -Albumin 2 - -predisposing patient to VTE   Hypocalcemia  -Likely due to hypoalbuminemia  -Ionized calcium level   NEUROLOGY - intubated and sedated - minimal sedation to achieve a RASS goal: -1 Wake up assessment pending   ID -continue IV abx as prescibed -follow up cultures  GI/Nutrition GI PROPHYLAXIS as indicated DIET-->TF's as tolerated Constipation protocol as indicated  ENDO - ICU hypoglycemic\Hyperglycemia protocol -check FSBS per protocol   ELECTROLYTES -follow labs as needed -replace as needed -pharmacy consultation   DVT/GI PRX ordered -SCDs  TRANSFUSIONS AS NEEDED MONITOR FSBS ASSESS the need for LABS as needed   Critical care provider statement:    Critical care time (minutes):  109   Critical care time was exclusive of:  Separately billable procedures and treating other patients   Critical care was necessary to treat or prevent imminent or life-threatening deterioration of the following conditions:   Cardiac arrest, acute hypoxemic respiratory failure, severe DKA, hypokalemia, hyperglycemia, hypoalbuminemia, severe protein calorie malnutrition, multiple comorbid conditions suicide attempt, drug overdose   Critical care was time spent personally by me on the following activities:  Development of treatment plan with patient or surrogate, discussions with consultants, evaluation of patient's response to treatment, examination of patient,  obtaining history from patient or surrogate, ordering and performing treatments and interventions, ordering and review of laboratory studies and re-evaluation of patient's condition.  I assumed direction of critical care for this patient from another provider in my specialty: no    This document was prepared using Dragon voice recognition software and may include unintentional dictation errors.    Ottie Glazier, M.D.  Division of [place]

## 2019-12-06 NOTE — ED Notes (Signed)
Three blood pressures taken with map greater than 110. Systolic over [id number].

## 2019-12-06 NOTE — ED Notes (Signed)
Pt's ring taken off and placed in belongings bag.  Prosthetic leg removed and placed in bag.

## 2019-12-06 NOTE — ED Provider Notes (Signed)
CXR demonstrates appropriate CL placement, ok to use   Lavonia Drafts, MD 12/18/2019 2346

## 2019-12-06 NOTE — ED Provider Notes (Signed)
Reviewed blood gas, eventrated great.  Glucose remains elevated as well, IV insulin bolus and every hour checks ordered  Dr. Corky Downs now working to arrange transfer as there is no ICU bed available at [facility]  EKG reviewed at 1330 Heart rate 110 QRS 90 QTc 500 EKG favored to represent sinus tachycardia, some elements suspicious for underlying flutter, though rate variable. No acute ischemia or stemi noted. Nonspecific t wave abnormalities.    Delman Kitten, MD 11/29/2019 1700

## 2019-12-06 NOTE — ED Notes (Signed)
Patient placed on bed warmer due to current body Temp.

## 2019-12-06 NOTE — ED Provider Notes (Signed)
Patient with multiple peripheral IVs, map has decreased, central line placed by me.  Apparently there are no ICU beds available tonight.  Linus Salmons Line  Date/Time: 12/17/2019 11:37 PM Performed by: Lavonia Drafts, MD Authorized by: Lavonia Drafts, MD   Consent:    Consent obtained:  Emergent situation Pre-procedure details:    Hand hygiene: Hand hygiene performed prior to insertion     Sterile barrier technique: All elements of maximal sterile technique followed     Skin preparation:  2% chlorhexidine   Skin preparation agent: Skin preparation agent completely dried prior to procedure   Anesthesia (see MAR for exact dosages):    Anesthesia method:  Local infiltration   Local anesthetic:  Lidocaine 1% w/o epi Procedure details:    Location:  R internal jugular   Patient position:  Trendelenburg   Procedural supplies:  Triple lumen   Landmarks identified: yes     Ultrasound guidance: yes     Sterile ultrasound techniques: Sterile gel and sterile probe covers were used     Number of attempts:  1   Successful placement: yes   Post-procedure details:    Post-procedure:  Dressing applied and line sutured   Assessment:  Blood return through all ports and free fluid flow   Patient tolerance of procedure:  Tolerated well, no immediate complications     Lavonia Drafts, MD 12/20/2019 2337

## 2019-12-06 NOTE — ED Provider Notes (Signed)
Medical screening examination/treatment/procedure(s) were conducted as a shared visit with non-physician practitioner(s) and myself.  I personally evaluated the patient during the encounter.  I was present throughout key portions of the encounter, was present during intubation.  Assisted in ordering medications and reassessment of patient during resuscitation  Code sepsis initiated.  CRITICAL CARE Performed by: Delman Kitten   Total critical care time: 30 minutes  Critical care time was exclusive of separately billable procedures and treating other patients.  Critical care was necessary to treat or prevent imminent or life-threatening deterioration.  Critical care was time spent personally by me on the following activities: development of treatment plan with patient and/or surrogate as well as nursing, discussions with consultants, evaluation of patient's response to treatment, examination of patient, obtaining history from patient or surrogate, ordering and performing treatments and interventions, ordering and review of laboratory studies, ordering and review of radiographic studies, pulse oximetry and re-evaluation of patient's condition.  ----------------------------------------- 1:55 PM on 11/24/2019 -----------------------------------------  Patient's blood pressure and map are improving.  Patient admitted to ICU, seen and evaluated by Dr. Lanney Gins in the ER. Will continue current car and peripheral vasopressors with hope to wean off over next 24 hours.  ----------------------------------------- 4:10 PM on 11/24/2019 -----------------------------------------  Also discussed with the patient's family, including his sister and girlfriend who report that they believe he overdosed intentionally due to severe depression on one of his medications, small possible blood pressure medicine.  Have added salicylate and acetaminophen levels.  Updated and notified Dr. Lanney Gins. Also discussed CXR  findings, will monitor for possible pneumothorax though seems likely on my assessment this is a skin fold or other device/line and less likely PTX.  Ongoing care and disposition assigned to Dr. Corky Downs.    Delman Kitten, MD 12/18/2019 [phone]

## 2019-12-06 NOTE — ED Notes (Signed)
Md Corky Downs aware of pt's temp trending upwards. No new orders at this time.

## 2019-12-06 NOTE — ED Provider Notes (Signed)
[facility] Emergency Department Provider Note  ____________________________________________  Time seen: Approximately 12:59 PM  I have reviewed the triage vital signs and the nursing notes.   HISTORY  Chief Complaint Respiratory Distress  Level 5 caveat: Patient unresponsive  HPI Richard [name] is a 64 y.o. male who presents the emergency department via EMS for respiratory distress, hypoglycemia, unresponsive/altered mental status.  According to EMS, patient's family talked to the patient last night and patient appeared to be well.  They were called out for concerns for respiratory and unresponsive status.  Patient was unresponsive for EMS. Initial O2 saturation was 56%. They state that patient had increased work of breathing, they had placed the patient on 15 L with nonrebreather with best oxygen saturation of 84% in route.  Patient was tachycardic, tachypneic, hypotensive for EMS.  Blood sugar read 554 with EMS.  Patient was found by EMS laying in his own vomit.  Patient is unresponsive, does not respond initially to painful stimuli.  Patient is unable to provide any information.  According to patient's medical charts.,  Patient has a history of A. fib, congestive heart failure, chronic back pain, coronary artery disease, diabetes, hypertension.  Patient has had respiratory failure, DKA in the past.         Past Medical History:  Diagnosis Date  . Atrial fibrillation ([place])    per patient  . Cancer ([place])   . CHF (congestive heart failure) ([place])   . Chronic back pain   . Chronic leg pain   . Coronary artery disease   . Diabetes mellitus without complication ([place])   . Hypertension   . Neuropathy     Patient Active Problem List   Diagnosis Date Noted  . MDD (major depressive disorder) 11/04/2019  . Major depressive disorder, recurrent episode, moderate ([place]) 11/01/2019  . Acute respiratory failure ([place]) 10/29/2019  . PAD (peripheral artery disease) ([place])  04/28/2019  . Below-knee amputation of left lower extremity ([place]) 04/20/2019  . Cocaine abuse ([place])   . Pressure injury of skin 03/12/2019  . Respiratory failure ([place]) 02/28/2019  . Diabetes with hyperosmolar coma ([place]) 11/11/2018  . A-fib ([place]) 09/20/2018  . Hyperglycemia 08/03/2018  . Protein-calorie malnutrition, severe 01/10/2018  . Sepsis ([place]) 01/09/2018  . DKA, type 2 ([place]) 09/05/2017  . Ileus ([place]) 08/24/2017  . Diabetes ([place]) 08/24/2017  . Atrial flutter ([place]) 08/24/2017  . CAD (coronary artery disease) 08/24/2017  . HTN (hypertension) 08/24/2017  . Atrial fibrillation ([place]) 08/24/2017  . Wound dehiscence, surgical, initial encounter 06/24/2017  . Acute hematogenous osteomyelitis of right foot ([place]) 06/20/2017  . Adenocarcinoma of pancreas ([place]) 06/20/2017  . Esophagitis 06/20/2017  . Cellulitis of right lower extremity 06/10/2017  . Hypertension, poor control 06/10/2017  . Insulin dependent type 2 diabetes mellitus ([place]) 06/10/2017  . Chest pain 08/10/2015    Past Surgical History:  Procedure Laterality Date  . AMPUTATION Left 03/13/2019   Procedure: AMPUTATION BELOW KNEE;  Surgeon: Algernon Huxley, MD;  Location: [facility] ORS;  Service: Vascular;  Laterality: Left;  . CORONARY ANGIOPLASTY WITH STENT PLACEMENT    . EMBOLECTOMY Left 03/12/2019   Procedure: EMBOLECTOMY SFA POPLITEAL;  Surgeon: Algernon Huxley, MD;  Location: [facility] ORS;  Service: Vascular;  Laterality: Left;  . ENDARTERECTOMY FEMORAL Left 03/12/2019   Procedure: ENDARTERECTOMY FEMORAL;  Surgeon: Algernon Huxley, MD;  Location: [facility] ORS;  Service: Vascular;  Laterality: Left;  . ESOPHAGOGASTRODUODENOSCOPY N/A 09/11/2017   Procedure: ESOPHAGOGASTRODUODENOSCOPY (EGD);  Surgeon: Virgel Manifold,  MD;  Location: [facility] ENDOSCOPY;  Service: Endoscopy;  Laterality: N/A;  . LOWER EXTREMITY ANGIOGRAPHY Left 11/17/2018   Procedure: Lower Extremity Angiography, with possible intervention;  Surgeon: Algernon Huxley, MD;  Location:  [facility];  Service: Cardiovascular;  Laterality: Left;  . LOWER EXTREMITY ANGIOGRAPHY Left 03/04/2019   Procedure: Lower Extremity Angiography;  Surgeon: Algernon Huxley, MD;  Location: [facility];  Service: Cardiovascular;  Laterality: Left;  . LOWER EXTREMITY ANGIOGRAPHY Left 07/01/2019   Procedure: LOWER EXTREMITY ANGIOGRAPHY;  Surgeon: Algernon Huxley, MD;  Location: [facility];  Service: Cardiovascular;  Laterality: Left;  . pancreas removed      Prior to Admission medications   Medication Sig Start Date End Date Taking? Authorizing Provider  ACCU-CHEK AVIVA PLUS test strip U ONCE TO BID UTD 11/13/19  Yes Clapacs, Madie Reno, MD  Accu-Chek Softclix Lancets lancets TEST 1-2 XD 11/13/19  Yes Clapacs, Madie Reno, MD  apixaban (ELIQUIS) 5 MG TABS tablet Take 1 tablet (5 mg total) by mouth every 12 (twelve) hours. 11/13/19  Yes Clapacs, Madie Reno, MD  carvedilol (COREG) 6.25 MG tablet Take 1 tablet (6.25 mg total) by mouth 2 (two) times daily with a meal. 11/13/19  Yes Clapacs, Madie Reno, MD  escitalopram (LEXAPRO) 5 MG tablet Take 1 tablet (5 mg total) by mouth daily. 11/14/19  Yes Clapacs, Madie Reno, MD  famotidine (PEPCID) 20 MG tablet TAKE 1 TABLET BY MOUTH TWICE DAILY Patient taking differently: Take 20 mg by mouth 2 (two) times daily.  11/25/19  Yes Clapacs, Madie Reno, MD  gabapentin (NEURONTIN) 300 MG capsule Take 1 capsule (300 mg total) by mouth 3 (three) times daily. 11/13/19  Yes Clapacs, Madie Reno, MD  insulin aspart (NOVOLOG) 100 UNIT/ML injection Inject 12 Units into the skin 3 (three) times daily with meals. 11/13/19  Yes Clapacs, Madie Reno, MD  insulin glargine (LANTUS) 100 UNIT/ML injection Inject 0.25 mLs (25 Units total) into the skin at bedtime. 11/13/19  Yes Clapacs, Madie Reno, MD  mirtazapine (REMERON) 45 MG tablet Take 1 tablet (45 mg total) by mouth at bedtime. 11/13/19  Yes Clapacs, Madie Reno, MD  rosuvastatin (CRESTOR) 40 MG tablet Take 1 tablet (40 mg total) by mouth daily at 6 PM.  11/13/19  Yes Clapacs, Madie Reno, MD  temazepam (RESTORIL) 15 MG capsule Take 1 capsule (15 mg total) by mouth at bedtime. 11/13/19  Yes Clapacs, Madie Reno, MD  thiamine 100 MG tablet Take 1 tablet (100 mg total) by mouth daily. 11/14/19  Yes Clapacs, Madie Reno, MD  traZODone (DESYREL) 50 MG tablet Take 1 tablet (50 mg total) by mouth at bedtime. 11/13/19  Yes Clapacs, Madie Reno, MD    Allergies Patient has no known allergies.  Family History  Problem Relation Age of Onset  . CAD Mother   . Diabetes Mellitus II Mother   . Diabetes Mellitus II Father     Social History Social History   Tobacco Use  . Smoking status: Current Every Day Smoker    Packs/day: 0.50    Years: 50.00    Pack years: 25.00  . Smokeless tobacco: Never Used  Substance Use Topics  . Alcohol use: No    Alcohol/week: 0.0 standard drinks  . Drug use: Yes    Types: Cocaine, Benzodiazepines    Comment: Has used in the past-heroin. Cocaine used over a year ago per pt.     Review of Systems level 5 caveat, patient unresponsive.  Majority of history provided by EMS. Constitutional: Unknown fever/chills.  Altered mental status/unresponsive Respiratory: Respiratory distress.  Hypoxic. Gastrointestinal: Patient was laying in his own emesis on EMS arrival Neurological: Patient unresponsive. 10-point ROS otherwise negative.  ____________________________________________   PHYSICAL EXAM:  VITAL SIGNS: ED Triage Vitals  Enc Vitals Group     BP 12/11/2019 1254 91/64     Pulse Rate 12/20/2019 1254 (!) 130     Resp 12/01/2019 1254 (!) 23     Temp --      Temp src --      SpO2 12/15/2019 1254 (!) 87 %     Weight 12/07/2019 1256 110 lb (49.9 kg)     Height 12/01/2019 1256 6' (1.829 m)     Head Circumference --      Peak Flow --      Pain Score --      Pain Loc --      Pain Edu? --      Excl. in [place]? --      Constitutional: Patient is unresponsive at this time. GCS 7 (2, 1, 4)  Patient would not respond to IV access, sternal rub  initially.  With continued painful stimuli, patient would open his eyes, withdrawal but immediately closes eyes and is nonverbal..  Ill-appearing and in moderate distress at this time. Eyes: Conjunctivae are normal. PERRL. EOMI. Head: Atraumatic. ENT:      Ears:       Nose: No congestion/rhinnorhea.      Mouth/Throat: Mucous membranes are dry.  Lips are cracked.  No angioedema. Neck: No stridor.   Hematological/Lymphatic/Immunilogical: No cervical lymphadenopathy. Cardiovascular: Normal rate, regular rhythm. Normal S1 and S2.  Good peripheral circulation. Respiratory: Increased respiratory effort with tachypnea and belly breathing but no intercostal retractions. Lungs with decreased lung sounds in all fields.  No appreciable wheezing, rales or rhonchi..  Poor air entry into the bases. Gastrointestinal: No external abdominal findings.  Bowel sounds 4 quadrants. Soft and nontender to palpation. No guarding or rigidity. No palpable masses. No distention.  Musculoskeletal: Patient withdrew his left arm with painful stimuli, has not moved any of the other extremities.  Evaluation of the extremities reveals no gross signs of infection.  Patient has had his right toe surgically amputated.  Patient has left below-knee amputation.  Prosthesis in place. Neurologic: Patient is unresponsive.  Responds to painful stimuli after repeated painful stimuli attempts. Skin:  Skin is warm, dry and intact. No rash noted. Psychiatric: Unable to assess give AMS/unresponsiveness  ____________________________________________   LABS (all labs ordered are listed, but only abnormal results are displayed)  Labs Reviewed  CBC WITH DIFFERENTIAL/PLATELET - Abnormal; Notable for the following components:      Result Value   RBC 3.16 (*)    Hemoglobin 8.8 (*)    HCT 28.7 (*)    Lymphs Abs 0.5 (*)    All other components within normal limits  COMPREHENSIVE METABOLIC PANEL - Abnormal; Notable for the following components:    Potassium 2.6 (*)    Chloride 114 (*)    CO2 17 (*)    Glucose, Bld 625 (*)    Calcium 6.0 (*)    Total Protein 4.4 (*)    Albumin 2.0 (*)    Alkaline Phosphatase 21 (*)    All other components within normal limits  LACTIC ACID, PLASMA - Abnormal; Notable for the following components:   Lactic Acid, Venous 3.5 (*)    All other components within normal limits  URINALYSIS,  COMPLETE (UACMP) WITH MICROSCOPIC - Abnormal; Notable for the following components:   Color, Urine STRAW (*)    APPearance CLEAR (*)    Glucose, UA >=500 (*)    Hgb urine dipstick MODERATE (*)    Ketones, ur 5 (*)    Bacteria, UA FEW (*)    All other components within normal limits  BLOOD GAS, VENOUS - Abnormal; Notable for the following components:   pH, Ven 7.21 (*)    pCO2, Ven 61 (*)    Acid-base deficit 4.5 (*)    All other components within normal limits  GLUCOSE, CAPILLARY - Abnormal; Notable for the following components:   Glucose-Capillary >600 (*)    All other components within normal limits  URINE DRUG SCREEN, QUALITATIVE ([facility] ONLY) - Abnormal; Notable for the following components:   Cocaine Metabolite,Ur [place] POSITIVE (*)    Benzodiazepine, Ur Scrn POSITIVE (*)    All other components within normal limits  GLUCOSE, CAPILLARY - Abnormal; Notable for the following components:   Glucose-Capillary >600 (*)    All other components within normal limits  BLOOD GAS, ARTERIAL - Abnormal; Notable for the following components:   pH, Arterial 7.13 (*)    pCO2 arterial 54 (*)    pO2, Arterial 80 (*)    Bicarbonate 18.9 (*)    Acid-base deficit 11.4 (*)    All other components within normal limits  TROPONIN I (HIGH SENSITIVITY) - Abnormal; Notable for the following components:   Troponin I (High Sensitivity) 18 (*)    All other components within normal limits  TROPONIN I (HIGH SENSITIVITY) - Abnormal; Notable for the following components:   Troponin I (High Sensitivity) 22 (*)    All other components  within normal limits  RESPIRATORY PANEL BY RT PCR (FLU A&B, COVID)  CULTURE, BLOOD (ROUTINE X 2)  CULTURE, BLOOD (ROUTINE X 2)  AMMONIA  LACTIC ACID, PLASMA  ACETAMINOPHEN LEVEL  SALICYLATE LEVEL  PHOSPHORUS  CBG MONITORING, ED  CBG MONITORING, ED  CBG MONITORING, ED  CBG MONITORING, ED  CBG MONITORING, ED  CBG MONITORING, ED  CBG MONITORING, ED  CBG MONITORING, ED  CBG MONITORING, ED  CBG MONITORING, ED  CBG MONITORING, ED  CBG MONITORING, ED  CBG MONITORING, ED  CBG MONITORING, ED  CBG MONITORING, ED  CBG MONITORING, ED   ____________________________________________  EKG   ____________________________________________  RADIOLOGY I personally viewed and evaluated these images as part of my medical decision making, as well as reviewing the written report by the radiologist.  DG Chest 1 View  Result Date: 12/05/2019 CLINICAL DATA:  Pt via EMS from home. Pt was found unresponsive. Hx of afib, chf, cad EXAM: CHEST  1 VIEW COMPARISON:  Chest radiograph 10/30/2019 FINDINGS: The heart size and mediastinal contours are within normal limits. Aortic arch calcification. There are coarse airspace opacities in the left mid lung and possibly more subtly in the right lower lung suspicious for infection or aspiration. No pneumothorax or significant pleural effusion. No acute finding in the visualized skeleton. IMPRESSION: Coarse airspace opacities in the left mid lung and possibly in the right lower lung suspicious for infection or aspiration. Electronically Signed   By: Audie Pinto M.D.   On: 12/05/2019 13:36   DG Abdomen 1 View  Result Date: 12/08/2019 CLINICAL DATA:  Status post OG tube placement EXAM: ABDOMEN - 1 VIEW COMPARISON:  Abdominal radiograph 10/29/2019 FINDINGS: Interval placement of a nasogastric tube with side port appropriately positioned over the stomach. The abdomen is  largely excluded from field of view. Bilateral airspace opacities better evaluated on concurrent  chest radiograph IMPRESSION: Interval placement of a nasogastric tube with side port appropriately positioned over the stomach. Electronically Signed   By: Audie Pinto M.D.   On: 12/11/2019 16:59   CT Head Wo Contrast  Result Date: 12/03/2019 CLINICAL DATA:  Altered mental status EXAM: CT HEAD WITHOUT CONTRAST TECHNIQUE: Contiguous axial images were obtained from the base of the skull through the vertex without intravenous contrast. COMPARISON:  CT brain 10/29/2019 FINDINGS: Brain: No acute territorial infarction, hemorrhage, or intracranial mass. Mild atrophy. Patchy hypodensity in the white matter consistent with chronic small vessel ischemic change. Stable ventricle size Vascular: No hyperdense vessels. Carotid and vertebral arterial calcification Skull: Normal. Negative for fracture or focal lesion. Sinuses/Orbits: Mucosal thickening in the ethmoid maxillary and sphenoid sinuses Other: None IMPRESSION: 1. No CT evidence for acute intracranial abnormality. 2. Atrophy and mild chronic small vessel ischemic change of the white matter Electronically Signed   By: Donavan Foil M.D.   On: 11/28/2019 16:31   DG Chest Portable 1 View  Result Date: 12/12/2019 CLINICAL DATA:  S/p intubation. EXAM: PORTABLE CHEST 1 VIEW COMPARISON:  Chest radiograph 11/29/2019 FINDINGS: Interval intubation with endotracheal tube tip between the thoracic inlet and carina. Interval worsening of airspace opacities bilaterally with increased consolidation in the right lower lung concerning for multifocal infection or aspiration. No significant pleural effusion. There is an area of lucency in the peripheral left lung favored to represent a skin fold, less likely a pneumothorax. IMPRESSION: 1. Interval intubation with endotracheal tube tip between the thoracic inlet and carina. 2. Interval worsening of airspace opacities bilaterally with increased consolidation in the right lower lung concerning for multifocal infection or  aspiration. 3. Area of lucency in the lateral left lung which appears to have bronchial markings beyond it on the prior radiograph, favored to represent a skin fold, less likely a pneumothorax. Repeat radiograph with patient centered on the film and as many leads/external objects off the chest as possible may be of use for clarification. These results were called by telephone at the time of interpretation on  at 2:53 pm to provider MARK QUALE , who verbally acknowledged these results. Electronically Signed   By: Audie Pinto M.D.   On: 12/18/2019 14:54    ____________________________________________    PROCEDURES  Procedure(s) performed:    .Critical Care Performed by: Darletta Moll, PA-C Authorized by: Darletta Moll, PA-C   Critical care provider statement:    Critical care time (minutes):  45   Critical care time was exclusive of:  Separately billable procedures and treating other patients and teaching time   Critical care was necessary to treat or prevent imminent or life-threatening deterioration of the following conditions:  Respiratory failure, metabolic crisis and dehydration   Critical care was time spent personally by me on the following activities:  Examination of patient, evaluation of patient's response to treatment, ordering and performing treatments and interventions, ordering and review of laboratory studies, ordering and review of radiographic studies, pulse oximetry, re-evaluation of patient's condition, review of old charts and ventilator management  Procedure Name: Intubation Date/Time: 12/05/2019 4:42 PM Performed by: Darletta Moll, PA-C Pre-anesthesia Checklist: Patient identified, Emergency Drugs available, Suction available, Patient being monitored and Timeout performed Oxygen Delivery Method: Ambu bag Induction Type: Rapid sequence Ventilation: Mask ventilation without difficulty Laryngoscope Size: Glidescope and 3 Tube size:  7.0 mm Number of attempts: 1 Airway Equipment and Method:  Patient positioned with wedge pillow,  Stylet and Video-laryngoscopy Placement Confirmation: ETT inserted through vocal cords under direct vision,  Positive ETCO2,  CO2 detector and Breath sounds checked- equal and bilateral Secured at: 24 cm Tube secured with: ETT holder Comments: Direct visualization with glide scope was appreciated of the vocal cords.  ET tube passed without difficulty.  Confirmation with end-tidal, color metrics, auscultation with equal breath sounds, chest x-ray.         Medications  0.9 %  sodium chloride infusion (250 mLs Intravenous New Bag/Given 11/30/2019 1417)  norepinephrine (LEVOPHED) 4mg  in 250mL premix infusion (12 mcg/min Intravenous Rate/Dose Change  1635)  propofol (DIPRIVAN) 1000 MG/100ML infusion (  Not Given 11/28/2019 1352)  norepinephrine (LEVOPHED) 4-5 MG/250ML-% infusion SOLN (  Canceled Entry 12/03/2019 1352)  fentaNYL 2525mcg in NS 268mL (32mcg/ml) infusion-PREMIX (125 mcg/hr Intravenous Rate/Dose Change 12/20/2019 1555)  midazolam (VERSED) 50 mg/50 mL (1 mg/mL) premix infusion (2.5 mg/hr Intravenous Rate/Dose Change 12/01/2019 1556)  albumin human 25 % solution 12.5 g (has no administration in time range)  insulin aspart (novoLOG) injection 10 Units (10 Units Intravenous Not Given 12/11/2019 1650)  potassium PHOSPHATE 30 mmol in dextrose 5 % 500 mL infusion (has no administration in time range)  magnesium sulfate IVPB 2 g 50 mL (has no administration in time range)  potassium chloride 10 mEq in 100 mL IVPB (has no administration in time range)  sodium chloride 0.9 % bolus 2,000 mL (0 mLs Intravenous Stopped 12/20/2019 1502)  rocuronium (ZEMURON) injection (60 mg Intravenous Given 12/17/2019 1338)  etomidate (AMIDATE) injection (15 mg Intravenous Given 12/10/2019 1339)  sodium chloride 0.9 % bolus 1,000 mL (1,000 mLs Intravenous New Bag/Given 11/28/2019 1413)  ceFEPIme (MAXIPIME) 2 g in sodium chloride 0.9 %  100 mL IVPB (0 g Intravenous Stopped 12/05/2019 1443)  metroNIDAZOLE (FLAGYL) IVPB 500 mg (0 mg Intravenous Stopped 12/12/2019 1609)  vancomycin (VANCOCIN) IVPB 1000 mg/200 mL premix (0 mg Intravenous Stopped 12/16/2019 1604)  calcium gluconate 1 g/ 50 mL sodium chloride IVPB (1,000 mg Intravenous New Bag/Given 12/04/2019 1607)     ____________________________________________   INITIAL IMPRESSION / ASSESSMENT AND PLAN / ED COURSE  Pertinent labs & imaging results that were available during my care of the patient were reviewed by me and considered in my medical decision making (see chart for details).  Review of the [place] CSRS was performed in accordance of the [place] prior to dispensing any controlled drugs.           Patient's diagnosis is consistent with acute respiratory failure.  Patient presented to emergency department via EMS from home.  According to family, patient was well last night.  It appears that patient may have been seen by his girlfriend earlier today and was also doing well.  Girlfriend had gone to church, returned to find patient unresponsive with significant surrounding vomit.  Patient was found to be hypoxic by EMS with O2 sats in the 50s.  Nonrebreather to the emergency department improved O2 saturations into the 80 percentiles.  On assessment, patient was initially unresponsive, then was responsive to painful stimuli.  Seemed to improve somewhat with BiPAP, fluids, then patient had episodes of emesis with BiPAP and there was concern for ongoing aspiration.  At this time, patient was intubated.  Further information from the family reveals that patient has been depressed, may have attempted suicide with blood pressure medications.  Patient was found to be hyperglycemic, hyperkalemic.  Patient has been hypotensive.  Patient is currently being maintained  with fluids, pressors, and abated.  Patient will be admitted to critical care service.  Critical care beds in this facility are full and as  such the patient will be transferred to another facility to receive critical care admission.    ____________________________________________  FINAL CLINICAL IMPRESSION(S) / ED DIAGNOSES  Final diagnoses:  Involuntary commitment  Acute respiratory failure, unspecified whether with hypoxia or hypercapnia ([place])      NEW MEDICATIONS STARTED DURING THIS VISIT:  ED Discharge Orders    None          This chart was dictated using voice recognition software/Dragon. Despite best efforts to proofread, errors can occur which can change the meaning. Any change was purely unintentional.    Darletta Moll, PA-C 12/03/2019 1725    Delman Kitten, MD 12/07/19 [phone]

## 2019-12-06 NOTE — ED Notes (Signed)
I spoke with the patient's emergency contact Andreas Newport) regarding medication history. She indicated that there had been no changes to the medications prescribed upon patient's discharge from [facility] in January 2021, but added that he has not (her emphasis) been taking his medications. She reports concern that he is intentionally not taking his medication and went so far as to say that she hand the other members of their family cannot take care of him if he won't do his part. She spoke for several minutes about the stress he and the family have experienced, in particular this episode which apparently occurred with friends and family in the house. She seemed appreciative for the chance to talk and said that if there was anything we needed to contact her.   ** The above is intended solely for informational and/or communicative purposes. It should in no way be considered an endorsement of any specific treatment, therapy or action. **

## 2019-12-06 NOTE — Progress Notes (Signed)
Pharmacy Electrolyte Monitoring Consult:  Pharmacy consulted to assist in monitoring and replacing electrolytes in this 64 y.o. male admitted on 12/20/2019 with cardiac arrest and respiratory failure secondary to suspected intentional overdose. Patient in DKA.   Labs:  Sodium (mmol/L)  Date Value  12/05/2019 142  12/07/2019 136   Potassium (mmol/L)  Date Value  11/25/2019 2.6 (LL)  11/30/2019 4.2   Magnesium (mg/dL)  Date Value  12/05/2019 1.8   Phosphorus (mg/dL)  Date Value  12/02/2019 7.5 (H)   Calcium (mg/dL)  Date Value  11/28/2019 6.0 (LL)  11/24/2019 8.4 (L)   Albumin (g/dL)  Date Value  12/05/2019 2.0 (L)  12/12/2019 2.6 (L)   Corrected calcium 7.6.   Assessment/Plan: Patient previously received physician ordered magnesium 2g IV x 1 and calcium gluconate 1g x 1.   Patient initially ordered potassium phosphorus; phosphorus reported as 7.5 will hold and recheck in am.   Will order potassium 40mEq Q1hr x 2 doses. Patient initiated on LR/Potassium 35mEq at 43mL/hr.   BMP scheduled at 1900 with initiation of DKA insulin infusion.   Will follow along with scheduled electrolytes.   Will replace for goal potassium ~ 4.5 and magnesium ~ 2.   Pharmacy will continue to monitor and adjust per consult.   Simpson,Michael L 12/18/2019 8:33 PM                          ,

## 2019-12-06 NOTE — Progress Notes (Signed)
Notified bedside nurse of need to draw lactic acid and repeat lactic acid. 

## 2019-12-06 NOTE — BH Assessment (Signed)
Unable to assess. Pt is currently unresponsive and is in the process in being transferred to ICU at another facility due to lack of ICU beds at [facility].

## 2019-12-06 NOTE — Progress Notes (Signed)
PHARMACY -  BRIEF ANTIBIOTIC NOTE   Pharmacy has received consult(s) for Cefepime and Vancomcyin from an ED provider.  The patient's profile has been reviewed for ht/wt/allergies/indication/available labs.    One time order(s) placed for Vancomycin 1g IV and Cefepime 2g IV   Further antibiotics/pharmacy consults should be ordered by admitting physician if indicated.                       Thank you, Pernell Dupre, PharmD, BCPS Clinical Pharmacist 12/10/2019 1:54 PM

## 2019-12-06 NOTE — Progress Notes (Signed)
Pharmacy Electrolyte Monitoring Consult:  Pharmacy consulted to assist in monitoring and replacing electrolytes in this 64 y.o. male admitted on 11/30/2019 with cardiac arrest and respiratory failure secondary to suspected intentional overdose. Patient in DKA.   Labs:  Sodium (mmol/L)  Date Value  11/24/2019 137   Potassium (mmol/L)  Date Value  12/12/2019 4.9   Magnesium (mg/dL)  Date Value  12/12/2019 1.8   Phosphorus (mg/dL)  Date Value  12/12/2019 7.5 (H)   Calcium (mg/dL)  Date Value  12/11/2019 8.4 (L)   Albumin (g/dL)  Date Value  11/26/2019 2.0 (L)  12/17/2019 2.6 (L)   Corrected calcium 7.6.   Assessment/Plan: Will remove potassium from LR, continue with LR at 27mL/hr.   BMP scheduled at 2300 with initiation of DKA insulin infusion.   Will follow along with scheduled electrolytes.   Will replace for goal potassium ~ 4.5 and magnesium ~ 2.   Pharmacy will continue to monitor and adjust per consult.   Richard [name] L 12/10/2019 8:42 PM                          ,

## 2019-12-07 ENCOUNTER — Inpatient Hospital Stay (HOSPITAL_COMMUNITY)
Admit: 2019-12-07 | Discharge: 2019-12-07 | Disposition: A | Discharge status: Home / Self Care | Payer: Medicare Other | Attending: Pulmonary Disease | Admitting: Pulmonary Disease

## 2019-12-07 DIAGNOSIS — R570 Cardiogenic shock: Secondary | ICD-10-CM | POA: Diagnosis present

## 2019-12-07 DIAGNOSIS — N179 Acute kidney failure, unspecified: Secondary | ICD-10-CM | POA: Diagnosis present

## 2019-12-07 DIAGNOSIS — G939 Disorder of brain, unspecified: Secondary | ICD-10-CM | POA: Diagnosis not present

## 2019-12-07 DIAGNOSIS — A419 Sepsis, unspecified organism: Secondary | ICD-10-CM | POA: Diagnosis present

## 2019-12-07 DIAGNOSIS — I110 Hypertensive heart disease with heart failure: Secondary | ICD-10-CM | POA: Diagnosis present

## 2019-12-07 DIAGNOSIS — J690 Pneumonitis due to inhalation of food and vomit: Secondary | ICD-10-CM | POA: Diagnosis present

## 2019-12-07 DIAGNOSIS — J9601 Acute respiratory failure with hypoxia: Secondary | ICD-10-CM | POA: Diagnosis present

## 2019-12-07 DIAGNOSIS — J9690 Respiratory failure, unspecified, unspecified whether with hypoxia or hypercapnia: Secondary | ICD-10-CM | POA: Diagnosis not present

## 2019-12-07 DIAGNOSIS — T424X2A Poisoning by benzodiazepines, intentional self-harm, initial encounter: Secondary | ICD-10-CM | POA: Diagnosis present

## 2019-12-07 DIAGNOSIS — G9341 Metabolic encephalopathy: Secondary | ICD-10-CM | POA: Diagnosis not present

## 2019-12-07 DIAGNOSIS — E1140 Type 2 diabetes mellitus with diabetic neuropathy, unspecified: Secondary | ICD-10-CM | POA: Diagnosis present

## 2019-12-07 DIAGNOSIS — R571 Hypovolemic shock: Secondary | ICD-10-CM | POA: Diagnosis present

## 2019-12-07 DIAGNOSIS — F1410 Cocaine abuse, uncomplicated: Secondary | ICD-10-CM | POA: Diagnosis not present

## 2019-12-07 DIAGNOSIS — R0689 Other abnormalities of breathing: Secondary | ICD-10-CM

## 2019-12-07 DIAGNOSIS — E1110 Type 2 diabetes mellitus with ketoacidosis without coma: Secondary | ICD-10-CM | POA: Diagnosis present

## 2019-12-07 DIAGNOSIS — I639 Cerebral infarction, unspecified: Secondary | ICD-10-CM | POA: Diagnosis not present

## 2019-12-07 DIAGNOSIS — E43 Unspecified severe protein-calorie malnutrition: Secondary | ICD-10-CM | POA: Diagnosis present

## 2019-12-07 DIAGNOSIS — Z681 Body mass index (BMI) 19 or less, adult: Secondary | ICD-10-CM | POA: Diagnosis present

## 2019-12-07 DIAGNOSIS — Z515 Encounter for palliative care: Secondary | ICD-10-CM | POA: Diagnosis present

## 2019-12-07 DIAGNOSIS — Z20822 Contact with and (suspected) exposure to covid-19: Secondary | ICD-10-CM | POA: Diagnosis present

## 2019-12-07 DIAGNOSIS — Z66 Do not resuscitate: Secondary | ICD-10-CM | POA: Diagnosis not present

## 2019-12-07 DIAGNOSIS — E1151 Type 2 diabetes mellitus with diabetic peripheral angiopathy without gangrene: Secondary | ICD-10-CM | POA: Diagnosis present

## 2019-12-07 DIAGNOSIS — R6521 Severe sepsis with septic shock: Secondary | ICD-10-CM | POA: Diagnosis present

## 2019-12-07 DIAGNOSIS — J9600 Acute respiratory failure, unspecified whether with hypoxia or hypercapnia: Secondary | ICD-10-CM | POA: Diagnosis not present

## 2019-12-07 DIAGNOSIS — I5021 Acute systolic (congestive) heart failure: Secondary | ICD-10-CM | POA: Diagnosis present

## 2019-12-07 DIAGNOSIS — R9401 Abnormal electroencephalogram [EEG]: Secondary | ICD-10-CM | POA: Diagnosis not present

## 2019-12-07 DIAGNOSIS — G931 Anoxic brain damage, not elsewhere classified: Secondary | ICD-10-CM | POA: Diagnosis present

## 2019-12-07 DIAGNOSIS — Z7189 Other specified counseling: Secondary | ICD-10-CM | POA: Diagnosis not present

## 2019-12-07 DIAGNOSIS — Z9911 Dependence on respirator [ventilator] status: Secondary | ICD-10-CM | POA: Diagnosis not present

## 2019-12-07 DIAGNOSIS — J9602 Acute respiratory failure with hypercapnia: Secondary | ICD-10-CM | POA: Diagnosis present

## 2019-12-07 DIAGNOSIS — E11649 Type 2 diabetes mellitus with hypoglycemia without coma: Secondary | ICD-10-CM | POA: Diagnosis present

## 2019-12-07 DIAGNOSIS — T405X2A Poisoning by cocaine, intentional self-harm, initial encounter: Secondary | ICD-10-CM | POA: Diagnosis present

## 2019-12-07 LAB — BASIC METABOLIC PANEL
Anion gap: 10 (ref 5–15)
Anion gap: 6 (ref 5–15)
Anion gap: 6 (ref 5–15)
Anion gap: 8 (ref 5–15)
Anion gap: 9 (ref 5–15)
BUN: 24 mg/dL — ABNORMAL HIGH (ref 8–23)
BUN: 26 mg/dL — ABNORMAL HIGH (ref 8–23)
BUN: 28 mg/dL — ABNORMAL HIGH (ref 8–23)
BUN: 28 mg/dL — ABNORMAL HIGH (ref 8–23)
BUN: 31 mg/dL — ABNORMAL HIGH (ref 8–23)
CO2: 22 mmol/L (ref 22–32)
CO2: 22 mmol/L (ref 22–32)
CO2: 23 mmol/L (ref 22–32)
CO2: 23 mmol/L (ref 22–32)
CO2: 26 mmol/L (ref 22–32)
Calcium: 7.5 mg/dL — ABNORMAL LOW (ref 8.9–10.3)
Calcium: 7.6 mg/dL — ABNORMAL LOW (ref 8.9–10.3)
Calcium: 7.6 mg/dL — ABNORMAL LOW (ref 8.9–10.3)
Calcium: 7.9 mg/dL — ABNORMAL LOW (ref 8.9–10.3)
Calcium: 7.9 mg/dL — ABNORMAL LOW (ref 8.9–10.3)
Chloride: 107 mmol/L (ref 98–111)
Chloride: 109 mmol/L (ref 98–111)
Chloride: 111 mmol/L (ref 98–111)
Chloride: 114 mmol/L — ABNORMAL HIGH (ref 98–111)
Chloride: 114 mmol/L — ABNORMAL HIGH (ref 98–111)
Creatinine, Ser: 1.97 mg/dL — ABNORMAL HIGH (ref 0.61–1.24)
Creatinine, Ser: 2 mg/dL — ABNORMAL HIGH (ref 0.61–1.24)
Creatinine, Ser: 2.04 mg/dL — ABNORMAL HIGH (ref 0.61–1.24)
Creatinine, Ser: 2.08 mg/dL — ABNORMAL HIGH (ref 0.61–1.24)
Creatinine, Ser: 2.11 mg/dL — ABNORMAL HIGH (ref 0.61–1.24)
GFR calc Af Amer: 37 mL/min — ABNORMAL LOW (ref >60)
GFR calc Af Amer: 38 mL/min — ABNORMAL LOW (ref >60)
GFR calc Af Amer: 39 mL/min — ABNORMAL LOW (ref >60)
GFR calc Af Amer: 40 mL/min — ABNORMAL LOW (ref >60)
GFR calc Af Amer: 40 mL/min — ABNORMAL LOW (ref >60)
GFR calc non Af Amer: 32 mL/min — ABNORMAL LOW (ref >60)
GFR calc non Af Amer: 33 mL/min — ABNORMAL LOW (ref >60)
GFR calc non Af Amer: 33 mL/min — ABNORMAL LOW (ref >60)
GFR calc non Af Amer: 34 mL/min — ABNORMAL LOW (ref >60)
GFR calc non Af Amer: 35 mL/min — ABNORMAL LOW (ref >60)
Glucose, Bld: 124 mg/dL — ABNORMAL HIGH (ref 70–99)
Glucose, Bld: 128 mg/dL — ABNORMAL HIGH (ref 70–99)
Glucose, Bld: 332 mg/dL — ABNORMAL HIGH (ref 70–99)
Glucose, Bld: 486 mg/dL — ABNORMAL HIGH (ref 70–99)
Glucose, Bld: 586 mg/dL (ref 70–99)
Potassium: 3.6 mmol/L (ref 3.5–5.1)
Potassium: 3.8 mmol/L (ref 3.5–5.1)
Potassium: 3.9 mmol/L (ref 3.5–5.1)
Potassium: 3.9 mmol/L (ref 3.5–5.1)
Potassium: 4.5 mmol/L (ref 3.5–5.1)
Sodium: 140 mmol/L (ref 135–145)
Sodium: 141 mmol/L (ref 135–145)
Sodium: 141 mmol/L (ref 135–145)
Sodium: 143 mmol/L (ref 135–145)
Sodium: 145 mmol/L (ref 135–145)

## 2019-12-07 LAB — BLOOD GAS, ARTERIAL
Acid-base deficit: 8 mmol/L — ABNORMAL HIGH (ref 0.0–2.0)
Acid-base deficit: 7.4 mmol/L — ABNORMAL HIGH (ref 0.0–2.0)
Bicarbonate: 21.2 mmol/L (ref 20.0–28.0)
Bicarbonate: 21.6 mmol/L (ref 20.0–28.0)
FIO2: 1
FIO2: 0.5
MECHVT: 500 mL
MECHVT: 500 mL
Mechanical Rate: 22
O2 Saturation: 93.9 %
O2 Saturation: 86.2 %
PEEP: 5 cmH2O
PEEP: 5 cmH2O
Patient temperature: 37
Patient temperature: 37
RATE: 26 resp/min
pCO2 arterial: 58 mmHg — ABNORMAL HIGH (ref 32.0–48.0)
pCO2 arterial: 58 mmHg — ABNORMAL HIGH (ref 32.0–48.0)
pH, Arterial: 7.17 — CL (ref 7.350–7.450)
pH, Arterial: 7.18 — CL (ref 7.350–7.450)
pO2, Arterial: 88 mmHg (ref 83.0–108.0)
pO2, Arterial: 65 mmHg — ABNORMAL LOW (ref 83.0–108.0)

## 2019-12-07 LAB — GLUCOSE, CAPILLARY
Glucose-Capillary: 100 mg/dL — ABNORMAL HIGH (ref 70–99)
Glucose-Capillary: 107 mg/dL — ABNORMAL HIGH (ref 70–99)
Glucose-Capillary: 114 mg/dL — ABNORMAL HIGH (ref 70–99)
Glucose-Capillary: 116 mg/dL — ABNORMAL HIGH (ref 70–99)
Glucose-Capillary: 116 mg/dL — ABNORMAL HIGH (ref 70–99)
Glucose-Capillary: 125 mg/dL — ABNORMAL HIGH (ref 70–99)
Glucose-Capillary: 135 mg/dL — ABNORMAL HIGH (ref 70–99)
Glucose-Capillary: 271 mg/dL — ABNORMAL HIGH (ref 70–99)
Glucose-Capillary: 288 mg/dL — ABNORMAL HIGH (ref 70–99)
Glucose-Capillary: 291 mg/dL — ABNORMAL HIGH (ref 70–99)
Glucose-Capillary: 433 mg/dL — ABNORMAL HIGH (ref 70–99)
Glucose-Capillary: 433 mg/dL — ABNORMAL HIGH (ref 70–99)
Glucose-Capillary: 439 mg/dL — ABNORMAL HIGH (ref 70–99)
Glucose-Capillary: 447 mg/dL — ABNORMAL HIGH (ref 70–99)
Glucose-Capillary: 454 mg/dL — ABNORMAL HIGH (ref 70–99)
Glucose-Capillary: 462 mg/dL — ABNORMAL HIGH (ref 70–99)
Glucose-Capillary: 483 mg/dL — ABNORMAL HIGH (ref 70–99)
Glucose-Capillary: 95 mg/dL (ref 70–99)
Glucose-Capillary: >600 mg/dL (ref 70–99)
Glucose-Capillary: >600 mg/dL (ref 70–99)

## 2019-12-07 LAB — CBC
HCT: 36.4 % — ABNORMAL LOW (ref 39.0–52.0)
Hemoglobin: 10.9 g/dL — ABNORMAL LOW (ref 13.0–17.0)
MCH: 27.3 pg (ref 26.0–34.0)
MCHC: 29.9 g/dL — ABNORMAL LOW (ref 30.0–36.0)
MCV: 91.2 fL (ref 80.0–100.0)
Platelets: 157 10*3/uL (ref 150–400)
RBC: 3.99 MIL/uL — ABNORMAL LOW (ref 4.22–5.81)
RDW: 14.7 % (ref 11.5–15.5)
WBC: 4.3 10*3/uL (ref 4.0–10.5)
nRBC: 0 % (ref 0.0–0.2)

## 2019-12-07 LAB — ECHOCARDIOGRAM COMPLETE
Height: 72 in
Weight: 1760 oz

## 2019-12-07 LAB — PHOSPHORUS: Phosphorus: 5.3 mg/dL — ABNORMAL HIGH (ref 2.5–4.6)

## 2019-12-07 LAB — BETA-HYDROXYBUTYRIC ACID
Beta-Hydroxybutyric Acid: 0.07 mmol/L (ref 0.05–0.27)
Beta-Hydroxybutyric Acid: 0.05 mmol/L (ref 0.05–0.27)

## 2019-12-07 LAB — HIV ANTIBODY (ROUTINE TESTING W REFLEX): HIV Screen 4th Generation wRfx: NONREACTIVE

## 2019-12-07 LAB — MRSA PCR SCREENING: MRSA by PCR: POSITIVE — AB

## 2019-12-07 LAB — PROCALCITONIN: Procalcitonin: 33.75 ng/mL

## 2019-12-07 LAB — MAGNESIUM: Magnesium: 2 mg/dL (ref 1.7–2.4)

## 2019-12-07 MED ORDER — LACTATED RINGERS IV BOLUS
1000.0000 mL | Freq: Once | INTRAVENOUS | Status: AC
  Administered 2019-12-07: 1000 mL via INTRAVENOUS

## 2019-12-07 MED ORDER — STERILE WATER FOR INJECTION IV SOLN
INTRAVENOUS | Status: DC
  Filled 2019-12-07 (×2): qty 850

## 2019-12-07 MED ORDER — NOREPINEPHRINE 16 MG/250ML-% IV SOLN
0.0000 ug/min | INTRAVENOUS | Status: DC
  Administered 2019-12-07: 08:00:00 40 ug/min via INTRAVENOUS
  Administered 2019-12-07: 20 ug/min via INTRAVENOUS
  Administered 2019-12-09: 9 ug/min via INTRAVENOUS
  Filled 2019-12-07 (×6): qty 250

## 2019-12-07 MED ORDER — INSULIN GLARGINE 100 UNIT/ML ~~LOC~~ SOLN
20.0000 [IU] | Freq: Every day | SUBCUTANEOUS | Status: DC
  Administered 2019-12-07 – 2019-12-11 (×5): 20 [IU] via SUBCUTANEOUS
  Filled 2019-12-07 (×5): qty 0.2

## 2019-12-07 MED ORDER — FAMOTIDINE IN NACL 20-0.9 MG/50ML-% IV SOLN
20.0000 mg | INTRAVENOUS | Status: DC
  Administered 2019-12-08 – 2019-12-11 (×4): 20 mg via INTRAVENOUS
  Filled 2019-12-07 (×4): qty 50

## 2019-12-07 MED ORDER — PHENYLEPHRINE CONCENTRATED 100MG/250ML (0.4 MG/ML) INFUSION SIMPLE
0.0000 ug/min | INTRAVENOUS | Status: DC
  Administered 2019-12-07: 20 ug/min via INTRAVENOUS
  Filled 2019-12-07: qty 250

## 2019-12-07 MED ORDER — SODIUM CHLORIDE 0.9 % IV SOLN: Freq: Once | INTRAVENOUS | Status: AC

## 2019-12-07 MED ORDER — ACETAMINOPHEN 325 MG PO TABS: 650.0000 mg | ORAL_TABLET | ORAL | Status: DC | PRN

## 2019-12-07 MED ORDER — HEPARIN SODIUM (PORCINE) 5000 UNIT/ML IJ SOLN
5000.0000 [IU] | Freq: Three times a day (TID) | INTRAMUSCULAR | Status: DC
  Administered 2019-12-07 – 2019-12-10 (×10): 5000 [IU] via SUBCUTANEOUS
  Filled 2019-12-07 (×10): qty 1

## 2019-12-07 MED ORDER — ONDANSETRON HCL 4 MG/2ML IJ SOLN: 4.0000 mg | Freq: Four times a day (QID) | INTRAMUSCULAR | Status: DC | PRN

## 2019-12-07 MED ORDER — ORAL CARE MOUTH RINSE
15.0000 mL | OROMUCOSAL | Status: DC
  Administered 2019-12-07 – 2019-12-12 (×51): 15 mL via OROMUCOSAL

## 2019-12-07 MED ORDER — INSULIN ASPART 100 UNIT/ML ~~LOC~~ SOLN
0.0000 [IU] | SUBCUTANEOUS | Status: DC
  Administered 2019-12-08: 1 [IU] via SUBCUTANEOUS
  Administered 2019-12-08 (×3): 2 [IU] via SUBCUTANEOUS
  Administered 2019-12-08 – 2019-12-09 (×3): 1 [IU] via SUBCUTANEOUS
  Administered 2019-12-09: 5 [IU] via SUBCUTANEOUS
  Administered 2019-12-09: 2 [IU] via SUBCUTANEOUS
  Administered 2019-12-09 – 2019-12-10 (×5): 1 [IU] via SUBCUTANEOUS
  Administered 2019-12-10: 3 [IU] via SUBCUTANEOUS
  Administered 2019-12-10: 08:00:00 1 [IU] via SUBCUTANEOUS
  Administered 2019-12-10: 04:00:00 5 [IU] via SUBCUTANEOUS
  Administered 2019-12-11 (×2): 1 [IU] via SUBCUTANEOUS
  Filled 2019-12-07 (×19): qty 1

## 2019-12-07 MED ORDER — IPRATROPIUM-ALBUTEROL 0.5-2.5 (3) MG/3ML IN SOLN: 3.0000 mL | RESPIRATORY_TRACT | Status: DC | PRN

## 2019-12-07 MED ORDER — HYDROCORTISONE NA SUCCINATE PF 100 MG IJ SOLR
50.0000 mg | Freq: Four times a day (QID) | INTRAMUSCULAR | Status: DC
  Administered 2019-12-07 – 2019-12-10 (×13): 50 mg via INTRAVENOUS
  Filled 2019-12-07 (×13): qty 2

## 2019-12-07 MED ORDER — VASOPRESSIN 20 UNIT/ML IV SOLN
0.0400 [IU]/min | INTRAVENOUS | Status: DC
  Administered 2019-12-07 – 2019-12-08 (×2): 0.04 [IU]/min via INTRAVENOUS
  Filled 2019-12-07 (×3): qty 2

## 2019-12-07 MED ORDER — CHLORHEXIDINE GLUCONATE CLOTH 2 % EX PADS
6.0000 | MEDICATED_PAD | Freq: Every day | CUTANEOUS | Status: DC
  Administered 2019-12-07 – 2019-12-11 (×4): 6 via TOPICAL

## 2019-12-07 MED ORDER — MUPIROCIN 2 % EX OINT
1.0000 "application " | TOPICAL_OINTMENT | Freq: Two times a day (BID) | CUTANEOUS | Status: DC
  Administered 2019-12-07 – 2019-12-11 (×9): 1 via NASAL
  Filled 2019-12-07: qty 22

## 2019-12-07 MED ORDER — CHLORHEXIDINE GLUCONATE 0.12% ORAL RINSE (MEDLINE KIT)
15.0000 mL | Freq: Two times a day (BID) | OROMUCOSAL | Status: DC
  Administered 2019-12-07 – 2019-12-12 (×11): 15 mL via OROMUCOSAL

## 2019-12-07 MED ORDER — SODIUM CHLORIDE 0.9 % IV SOLN
3.0000 g | Freq: Two times a day (BID) | INTRAVENOUS | Status: DC
  Administered 2019-12-07 – 2019-12-09 (×4): 3 g via INTRAVENOUS
  Filled 2019-12-07 (×3): qty 8
  Filled 2019-12-07 (×4): qty 3

## 2019-12-07 MED FILL — Sodium Chloride IV Soln 0.9%: INTRAVENOUS | Qty: 250 | Status: AC

## 2019-12-07 MED FILL — Phenylephrine HCl IV Soln 10 MG/ML: INTRAVENOUS | Qty: 100 | Status: AC

## 2019-12-07 NOTE — Progress Notes (Signed)
Assisted tele visit to patient with family member.  Kohle Winner McEachran, RN  

## 2019-12-07 NOTE — Progress Notes (Signed)
Pharmacy Electrolyte Monitoring Consult:  Pharmacy consulted to assist in monitoring and replacing electrolytes in this 64 y.o. male admitted on 12/04/2019 with cardiac arrest and respiratory failure secondary to suspected intentional overdose. Patient in DKA.   Labs:  Sodium (mmol/L)  Date Value  12/07/2019 143   Potassium (mmol/L)  Date Value  12/07/2019 3.9   Magnesium (mg/dL)  Date Value  12/07/2019 2.0   Phosphorus (mg/dL)  Date Value  12/07/2019 5.3 (H)   Calcium (mg/dL)  Date Value  12/07/2019 7.6 (L)   Albumin (g/dL)  Date Value  12/17/2019 2.0 (L)  11/27/2019 2.6 (L)   Corrected calcium 7.6.   Assessment/Plan: Plan to transition patient off insulin drip at this time. Patient remains on bicarb drip per CCM with plan to likely discontinue tomorrow. Will follow up potassium at 1700.   Will follow up electrolytes with morning labs.  Will replace for goal potassium ~ 4 and magnesium ~ 2.   Pharmacy will continue to monitor and adjust per consult.   Tawnya Crook, PharmD 12/07/2019 3:18 PM                          ,

## 2019-12-07 NOTE — Progress Notes (Signed)
*  PRELIMINARY RESULTS* Echocardiogram 2D Echocardiogram has been performed.  Richard [name] 12/07/2019, 9:20 AM

## 2019-12-07 NOTE — Progress Notes (Addendum)
Inpatient Diabetes Program Recommendations  AACE/ADA: New Consensus Statement on Inpatient Glycemic Control (2015)  Target Ranges:  Prepandial:   less than 140 mg/dL      Peak postprandial:   less than 180 mg/dL (1-2 hours)      Critically ill patients:  140 - 180 mg/dL   Lab Results  Component Value Date   GLUCAP 271 (H) 12/07/2019   HGBA1C 14.9 (H) 10/30/2019    Review of Glycemic Control Results for Richard [name], Richard [name] (MRN [id number]) as of 12/07/2019 09:52  Ref. Range 12/07/2019 05:34 12/07/2019 06:29 12/07/2019 06:55 12/07/2019 08:11 12/07/2019 09:37  Glucose-Capillary Latest Ref Range: 70 - 99 mg/dL 454 (H) 433 (H) 291 (H) 288 (H) 271 (H)   Diabetes history: DM 1-S/P pancreatectomy Outpatient Diabetes medications:  Novolog 12 units tid with meals, Lantus 25 units q HS Current orders for Inpatient glycemic control:  IV insulin-DKA order set Inpatient Diabetes Program Recommendations:    It appears that acidosis is cleared from DKA however CBG's still >200 mg/dL.  Recommend continuation of insulin drip until CBG's 140-180 mg/dL for 2-4 consecutive measurements.  Note that steroids started as well so insulin need will likely be higher.    Upon transition off insulin drip, consider Lantus 20 units 1-2 hours prior to d/c of insulin drip.    Will follow.   Thanks  Adah Perl, RN, BC-ADM Inpatient Diabetes Coordinator Pager [phone] (8a-5p)

## 2019-12-07 NOTE — ED Notes (Signed)
Admitting MD at bedside.

## 2019-12-07 NOTE — Progress Notes (Signed)
Shift summary:  - Patient admitted to unit from ED; tubed and sedated. - Transitioning off of EndoTool per Dr. Mortimer Fries. - GCS 3, unresponsive.

## 2019-12-07 NOTE — ED Notes (Signed)
RT at bedside.

## 2019-12-07 NOTE — ED Provider Notes (Signed)
-----------------------------------------   4:00 AM on 12/07/2019 -----------------------------------------  Patient noted to have worsening blood pressure and increasing pressor requirement, will add on vasopressin given he is now nearly maxed out on Levophed.  Lab work shows what appears to be resolution of DKA despite this, he does have worsening of his renal function.  Unfortunately, ICU beds are not currently available here at [facility] or [facility].  We will continue to monitor here in the ED until ICU beds become available.  ----------------------------------------- 6:54 AM on 12/07/2019 -----------------------------------------  ICU beds to become available here at [facility] this morning given increased staffing.  Case discussed with NP Darlyn Chamber, who accepts patient for admission to the ICU here, blood pressure remained stable with the addition of vasopressin.   Blake Divine, MD 12/07/19 [phone]

## 2019-12-07 NOTE — Progress Notes (Signed)
Called and updated pt's sister Linard Millers of pt status and plan of care. All questions answered.  She was very appreciative of update.    Darel Hong, AGACNP-BC [facility] Pager: [phone]

## 2019-12-07 NOTE — Progress Notes (Signed)
PHARMACY NOTE:  ANTIMICROBIAL RENAL DOSAGE ADJUSTMENT  Current antimicrobial regimen includes a mismatch between antimicrobial dosage and estimated renal function.  As per policy approved by the Pharmacy & Therapeutics and Medical Executive Committees, the antimicrobial dosage will be adjusted accordingly.  Current antimicrobial dosage:  Unasyn 3g IV q6h  Indication: aspiration pneumonia  Renal Function:  Estimated Creatinine Clearance: 25.3 mL/min (A) (by C-G formula based on SCr of 2.08 mg/dL (H)). []      On intermittent HD, scheduled: []      On CRRT    Antimicrobial dosage has been changed to:  Unasyn 3g IV q12h  Additional comments:   Thank you for allowing pharmacy to be a part of this patient's care.  Paulina Fusi, PharmD, BCPS 12/07/2019 6:48 AM

## 2019-12-07 NOTE — Plan of Care (Signed)

## 2019-12-07 NOTE — Progress Notes (Signed)
Pharmacy Electrolyte Monitoring Consult:  Pharmacy consulted to assist in monitoring and replacing electrolytes in this 64 y.o. male admitted on 12/16/2019 with cardiac arrest and respiratory failure secondary to suspected intentional overdose. Patient in DKA.   Labs:  Sodium (mmol/L)  Date Value  12/07/2019 145   Potassium (mmol/L)  Date Value  12/07/2019 4.5   Magnesium (mg/dL)  Date Value  12/07/2019 2.0   Phosphorus (mg/dL)  Date Value  12/07/2019 5.3 (H)   Calcium (mg/dL)  Date Value  12/07/2019 7.5 (L)   Albumin (g/dL)  Date Value  12/12/2019 2.0 (L)  12/15/2019 2.6 (L)   Corrected calcium 7.6.   Assessment/Plan: Plan to transition patient off insulin drip at this time. Patient remains on bicarb drip per CCM with plan to likely discontinue tomorrow.  @1700  K: 4.5. No additional replacement needed at this time.    Will follow up electrolytes with morning labs.  Will replace for goal potassium ~ 4 and magnesium ~ 2.   Pharmacy will continue to monitor and adjust per consult.   Pernell Dupre, PharmD, BCPS Clinical Pharmacist 12/07/2019 6:11 PM       ,

## 2019-12-07 NOTE — H&P (Signed)
PULMONARY / CRITICAL CARE MEDICINE   Name: Oday Ridings MRN: [id number] DOB: 07/14/1956    ADMISSION DATE:  12/17/2019 CONSULTATION DATE:  12/07/2019  REFERRING MD:  Dr. Charna Archer  CHIEF COMPLAINT:  Unresponsive  BRIEF DISCUSSION: [name] admitted with Septic shock, DKA, and severe Acute Hypoxic Hypercapnic Respiratory Failure secondary to drug overdose (cocaine & Benzo's) and aspiration pneumonia.   Was intubated in the ED.  Now with AKI.  HISTORY OF PRESENT ILLNESS:   Mr. Fadeley is a 64 y.o. male with a past medical history notable for CHF, A-fib, CAD, Diabetes mellitus, chronic back pain, hypertension, and cancer who presents to [facility] ED on 11/28/2019 due to unresponsiveness and respiratory distress.  Pt is currently intubated and sedated, with no family present, therefore history is obtained from ED and nursing notes.  Per notes, the family talked to the patient last night and he appeared well.  But today his girlfriend found him unresponsive with increased work of breathing.  Upon EMS arrival he was laying in his own vomit, was hypoxic with O2 saturations 56%, tachycardic, hypotensive, andy hyperglycemic with blood sugar 554.  Upon arrival to the ED he was transitioned to BiPAP from nonrebreather, however he required intubation due to further emesis with ongoing aspiration.  Initial workup in the ED revealed Glucose 625, potassium 2.6, Bicarb 17, Creatinine 1.11, Anion gap 11, high sensitivity troponin 18, lactic acid 3.5, WBC 4.3, hemoglobin 8.8, beta hydroxybutyric acid 4.27, acetaminophen <68, salicylate <7.  His COVID-19 PCR is negative and Influenza PCR is negative.  Urinalysis is negative for UTI, positive for ketones.  Urine drug screen is positive for Benzodiazepines and cocaine.  CXR is concerning for aspiration pneumonia.  He required ICU admission, however there were no ICU beds available here at [facility], thus he remained in the ED while awaiting transfer to [facility].  He became more  hypotensive requiring increased vasopressors and placement of central line.  ICU beds are now available at [facility].  PCCM is consulted to admit the pt for further workup and treatment of DKA, AKI, Septic shock, and severe Acute Hypoxic Hypercapnic Respiratory Failure secondary to cocaine & benzodiazapine overdose and severe aspiration pneumonia.  PAST MEDICAL HISTORY :  He  has a past medical history of Atrial fibrillation ([place]), Cancer ([place]), CHF (congestive heart failure) ([place]), Chronic back pain, Chronic leg pain, Coronary artery disease, Diabetes mellitus without complication ([place]), Hypertension, and Neuropathy.  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  No Known Allergies  No current facility-administered medications on file prior to encounter.   Current Outpatient Medications on File Prior to Encounter  Medication Sig  . ACCU-CHEK AVIVA PLUS test strip U ONCE TO BID UTD  . Accu-Chek Softclix Lancets lancets TEST 1-2 XD  . apixaban (ELIQUIS) 5 MG TABS tablet Take 1 tablet (5 mg total) by mouth every 12 (twelve) hours.  . carvedilol (COREG) 6.25 MG tablet Take 1 tablet (6.25 mg total) by mouth 2 (two) times daily with a meal.  . escitalopram (LEXAPRO) 5 MG tablet Take 1 tablet (5 mg total) by mouth daily.  . famotidine (PEPCID) 20 MG tablet TAKE 1 TABLET BY MOUTH TWICE DAILY (Patient taking differently: Take 20 mg by mouth 2 (two) times daily. )  . gabapentin (  NEURONTIN) 300 MG capsule Take 1 capsule (300 mg total) by mouth 3 (three) times daily.  . insulin aspart (NOVOLOG) 100 UNIT/ML injection Inject 12 Units into the skin 3  (three) times daily with meals.  . insulin glargine (LANTUS) 100 UNIT/ML injection Inject 0.25 mLs (25 Units total) into the skin at bedtime.  . mirtazapine (REMERON) 45 MG tablet Take 1 tablet (45 mg total) by mouth at bedtime.  . rosuvastatin (CRESTOR) 40 MG tablet Take 1 tablet (40 mg total) by mouth daily at 6 PM.  . temazepam (RESTORIL) 15 MG capsule Take 1 capsule (15 mg total) by mouth at bedtime.  . thiamine 100 MG tablet Take 1 tablet (100 mg total) by mouth daily.  . traZODone (DESYREL) 50 MG tablet Take 1 tablet (50 mg total) by mouth at bedtime.    FAMILY HISTORY:  His He indicated that his mother is alive. He indicated that his father is alive.   SOCIAL HISTORY: He  reports that he has been smoking. He has a 25.00 pack-year smoking history. He has never used smokeless tobacco. He reports current drug use. Drugs: Cocaine and Benzodiazepines. He reports that he does not drink alcohol.    COVID-19 DISASTER DECLARATION:  FULL CONTACT PHYSICAL EXAMINATION WAS NOT POSSIBLE DUE TO TREATMENT OF COVID-19 AND  CONSERVATION OF PERSONAL PROTECTIVE EQUIPMENT, LIMITED EXAM FINDINGS INCLUDE-  Patient assessed or the symptoms described in the history of present illness.  In the context of the Global COVID-19 pandemic, which necessitated consideration that the patient might be at risk for infection with the SARS-CoV-2 virus that causes COVID-19, Institutional protocols and algorithms that pertain to the evaluation of patients at risk for COVID-19 are in a state of rapid change based on information released by regulatory bodies including the CDC and federal and state organizations. These policies and algorithms were followed during the patient's care while in hospital.   REVIEW OF SYSTEMS:   Unable to assess due to critical illness and intubation  SUBJECTIVE:  Unable to assess due to critical illness and intubation  VITAL SIGNS: BP (!) 78/59   Pulse (!) 116   Temp 100 F (37.8 C)    Resp 15   Ht 6' (1.829 m)   Wt 49.9 kg   SpO2 100%   BMI 14.92 kg/m   HEMODYNAMICS:    VENTILATOR SETTINGS: Vent Mode: AC FiO2 (%):  [100 %] 100 % Set Rate:  [16 bmp-22 bmp] 22 bmp Vt Set:  [500 mL] 500 mL PEEP:  [5 cmH20] 5 cmH20  INTAKE / OUTPUT: I/O last 3 completed shifts: In: 3150 [IV Piggyback:3150] Out: 1600 [Urine:1600]  PHYSICAL EXAMINATION: General:  Critically ill appearing male, laying in bed, intubated and sedated, in no acute distress Neuro:  Sedated, withdraws from pain, pupils PERRLA HEENT:  Atraumatic, normocephalic, neck supple, no JVD, ET Tube in place Cardiovascular:  Tachycardia, regular rhythm, S1S2, no M/R/G Lungs:  Coarse breath sounds throughout, no wheezing, vent assisted, even Abdomen:  Soft, nontender, nondistended, no guarding or rebound tenderness, BS+ x4 Musculoskeletal:  Normal bulk and tone, Left AKA Skin:  Warm and dry, no obvious rashes, lesions, or ulcerations  LABS:  BMET Recent Labs  Lab 12/20/2019 2310 12/07/19 0012 12/07/19 0301  NA 140 140 141  K 5.9* 3.9 3.8  CL 123* 107 109  CO2 12* 23 26  BUN 15 24* 26*  CREATININE 0.88 1.97* 2.08*  GLUCOSE 416* 586* 486*    Electrolytes Recent Labs  Lab 11/24/2019 1459 11/30/2019  1629 12/07/2019 1929 12/05/2019 2310 12/07/19 0012 12/07/19 0301  CALCIUM 8.4*  6.0*  --    < > 4.4* 7.9* 7.9*  MG  --  1.8  --   --   --   --   PHOS 7.5*  --   --   --   --   --    < > = values in this interval not displayed.    CBC Recent Labs  Lab 12/07/2019 1459  WBC 4.3  HGB 8.8*  HCT 28.7*  PLT 169    Coag's No results for input(s): APTT, INR in the last 168 hours.  Sepsis Markers Recent Labs  Lab 12/05/2019 1459 12/15/2019 2055 12/05/2019 2221  LATICACIDVEN 3.8* 4.1* 5.2*    ABG Recent Labs  Lab 12/15/2019 1637  PHART 7.13*  PCO2ART 54*  PO2ART 80*    Liver Enzymes Recent Labs  Lab 12/02/2019 1459  AST 28  15  ALT 13  10  ALKPHOS 27*  21*  BILITOT 1.0  0.9  ALBUMIN 2.6*   2.0*    Cardiac Enzymes No results for input(s): TROPONINI, PROBNP in the last 168 hours.  Glucose Recent Labs  Lab 12/07/19 0317 12/07/19 0401 12/07/19 0429 12/07/19 0500 12/07/19 0534 12/07/19 0629  GLUCAP 462* 447* 433* 439* 454* 433*    Imaging DG Chest 1 View  Result Date: 12/01/2019 CLINICAL DATA:  Pt via EMS from home. Pt was found unresponsive. Hx of afib, chf, cad EXAM: CHEST  1 VIEW COMPARISON:  Chest radiograph 10/30/2019 FINDINGS: The heart size and mediastinal contours are within normal limits. Aortic arch calcification. There are coarse airspace opacities in the left mid lung and possibly more subtly in the right lower lung suspicious for infection or aspiration. No pneumothorax or significant pleural effusion. No acute finding in the visualized skeleton. IMPRESSION: Coarse airspace opacities in the left mid lung and possibly in the right lower lung suspicious for infection or aspiration. Electronically Signed   By: Audie Pinto M.D.   On: 11/26/2019 13:36   DG Abdomen 1 View  Result Date: 12/05/2019 CLINICAL DATA:  Status post OG tube placement EXAM: ABDOMEN - 1 VIEW COMPARISON:  Abdominal radiograph 10/29/2019 FINDINGS: Interval placement of a nasogastric tube with side port appropriately positioned over the stomach. The abdomen is largely excluded from field of view. Bilateral airspace opacities better evaluated on concurrent chest radiograph IMPRESSION: Interval placement of a nasogastric tube with side port appropriately positioned over the stomach. Electronically Signed   By: Audie Pinto M.D.   On: 12/16/2019 16:59   CT Head Wo Contrast  Result Date: 12/02/2019 CLINICAL DATA:  Altered mental status EXAM: CT HEAD WITHOUT CONTRAST TECHNIQUE: Contiguous axial images were obtained from the base of the skull through the vertex without intravenous contrast. COMPARISON:  CT brain 10/29/2019 FINDINGS: Brain: No acute territorial infarction, hemorrhage, or  intracranial mass. Mild atrophy. Patchy hypodensity in the white matter consistent with chronic small vessel ischemic change. Stable ventricle size Vascular: No hyperdense vessels. Carotid and vertebral arterial calcification Skull: Normal. Negative for fracture or focal lesion. Sinuses/Orbits: Mucosal thickening in the ethmoid maxillary and sphenoid sinuses Other: None IMPRESSION: 1. No CT evidence for acute intracranial abnormality. 2. Atrophy and mild chronic small vessel ischemic change of the white matter Electronically Signed   By: Donavan Foil M.D.   On: 12/20/2019 16:31   DG Chest Portable 1 View  Result Date: 11/23/2019 CLINICAL DATA:  Central line placement, abnormal chest x-ray EXAM: PORTABLE CHEST  1 VIEW COMPARISON:  11/25/2019 at 1:58 p.m. FINDINGS: Two frontal views of the chest are obtained. Endotracheal tube unchanged, tip overlying thoracic inlet. Enteric catheter passes below diaphragm tip excluded by collimation. Right internal jugular catheter overlies the atrial caval junction. There is widespread bilateral airspace disease, right greater than left, which has progressed since prior study. No effusion or pneumothorax. IMPRESSION: 1. No complication after central venous catheter placement. 2. Worsening bilateral airspace disease consistent with progressive multifocal pneumonia. Electronically Signed   By: Randa Ngo M.D.   On: 11/30/2019 23:55   DG Chest Portable 1 View  Result Date: 12/16/2019 CLINICAL DATA:  S/p intubation. EXAM: PORTABLE CHEST 1 VIEW COMPARISON:  Chest radiograph 12/17/2019 FINDINGS: Interval intubation with endotracheal tube tip between the thoracic inlet and carina. Interval worsening of airspace opacities bilaterally with increased consolidation in the right lower lung concerning for multifocal infection or aspiration. No significant pleural effusion. There is an area of lucency in the peripheral left lung favored to represent a skin fold, less likely a  pneumothorax. IMPRESSION: 1. Interval intubation with endotracheal tube tip between the thoracic inlet and carina. 2. Interval worsening of airspace opacities bilaterally with increased consolidation in the right lower lung concerning for multifocal infection or aspiration. 3. Area of lucency in the lateral left lung which appears to have bronchial markings beyond it on the prior radiograph, favored to represent a skin fold, less likely a pneumothorax. Repeat radiograph with patient centered on the film and as many leads/external objects off the chest as possible may be of use for clarification. These results were called by telephone at the time of interpretation on 12/12/2019 at 2:53 pm to provider MARK QUALE , who verbally acknowledged these results. Electronically Signed   By: Audie Pinto M.D.   On: 11/29/2019 14:54     STUDIES:  2/14- CXR>> 1. Interval intubation with endotracheal tube tip between the thoracic inlet and carina. 2. Interval worsening of airspace opacities bilaterally with increased consolidation in the right lower lung concerning for multifocal infection or aspiration. 3. Area of lucency in the lateral left lung which appears to have bronchial markings beyond it on the prior radiograph, favored to represent a skin fold, less likely a pneumothorax. Repeat radiograph with patient centered on the film and as many leads/external objects off the chest as possible may be of use for clarification. 2/14- CT Head w/o contrast>>1. No CT evidence for acute intracranial abnormality. 2. Atrophy and mild chronic small vessel ischemic change of the white matter  CULTURES: SARS-CoV-2 PCR 2/14>> negative Influenza PCR 2/14>>negative Blood culture 2/14>> Tracheal aspirate 2/15>> Strep pneumo urinary antigen 2/15>> Legionella urinary antigen 2/15>>  ANTIBIOTICS: Cefepime 2/14>>2/14 Flagyl 2/14>>2/14 Vancomycin 2/14>>2/14 Unasyn 2/14>>  SIGNIFICANT EVENTS: 2/14>> Presented to  ED 2/14>> No ICU beds available at [facility], to be transferred to [facility] 2/15>>No ICU beds available at [facility], will be admitted to [facility] ICU as beds now available  LINES/TUBES: ET Tube 2/14>> Right IJ CVC (placed by ED provider) 2/14>>   ASSESSMENT / PLAN:  PULMONARY A: Acute Hypoxic & Hypercapnic Respiratory Failure secondary to drug overdose (Cocaine & Benzo's) and Aspiration Pneumonia P:   Full vent support Wean FiO2 & PEEP as tolerated to maintain O2 sats >92% Follow intermittent CXR & ABG Continue Unasyn PRN Bronchodilators Spontaneous breathing trials when parameters met  CARDIOVASCULAR A:  Septic Shock secondary to aspiration + Hypovolemic shock secondary to DKA Hx: HTN, CAD, CHF, A-fib P:  Continuous cardiac monitoring Maintain MAP >  65 Received IV Fluid resuscitation in ED IV fluids Vasopressors as needed to maintain MAP goal Add IV Solu-cortef Obtain 2D Echocardiogram  RENAL A:   AKI P:   Monitor I&O's / urinary output Follow BMP Ensure adequate renal perfusion Avoid nephrotoxic agents as able Replace electrolytes as indicated IV Fluids Consider Nephrology consult  GASTROINTESTINAL A:   No acute issues P:   NPO OG tube to LIS Pepcid for stress ulcer prophylaxis   HEMATOLOGIC A:   Anemia without s/sx of bleeding P:  Monitor for S/Sx of bleeding Trend CBC SQ Heparin for VTE Prophylaxis  Transfuse for Hgb <7  INFECTIOUS A:   Aspiration Pneumonia P:   Monitor fever curve Trend WBC's and Procalcitonin Follow cultures as above Continue Unasyn  ENDOCRINE A:   DKA  P:   Follow DKA protocol Aggressive IV Fluids IV insulin drip Follow BMP q4h When Anion gap closed and Bicarb >20, can convert to SSI and long acting insulin Consult diabetes coordinator, appreciate input  NEUROLOGIC A:   Cocaine & Benzodiazepine overdose (unsure if intentional vs. Unintentional, however family thinks it was intentional as he has been depressed) P:    RASS goal: 0 to -1 Fentanyl and versed drips to maintain RASS goal Avoid sedating medications as able Daily wake up assessment IVC Consult Psychiatry, appreciate input   FAMILY  - Updates: No family at bedside during NP rounds for update 12/07/19.  - Inter-disciplinary family meet or Palliative Care meeting due by:  12/17/2019  Pt's prognosis is guarded, and he is at high risk for cardiac arrest and death  Darel Hong, [facility] [place] Pager: [phone]  12/07/2019, 6:36 AM

## 2019-12-08 ENCOUNTER — Inpatient Hospital Stay: Payer: Medicare Other

## 2019-12-08 DIAGNOSIS — R9401 Abnormal electroencephalogram [EEG]: Secondary | ICD-10-CM

## 2019-12-08 LAB — BASIC METABOLIC PANEL
Anion gap: 10 (ref 5–15)
BUN: 35 mg/dL — ABNORMAL HIGH (ref 8–23)
CO2: 24 mmol/L (ref 22–32)
Calcium: 7.4 mg/dL — ABNORMAL LOW (ref 8.9–10.3)
Chloride: 110 mmol/L (ref 98–111)
Creatinine, Ser: 2.55 mg/dL — ABNORMAL HIGH (ref 0.61–1.24)
GFR calc Af Amer: 30 mL/min — ABNORMAL LOW (ref >60)
GFR calc non Af Amer: 26 mL/min — ABNORMAL LOW (ref >60)
Glucose, Bld: 189 mg/dL — ABNORMAL HIGH (ref 70–99)
Potassium: 5.1 mmol/L (ref 3.5–5.1)
Sodium: 144 mmol/L (ref 135–145)

## 2019-12-08 LAB — GLUCOSE, CAPILLARY
Glucose-Capillary: 134 mg/dL — ABNORMAL HIGH (ref 70–99)
Glucose-Capillary: 143 mg/dL — ABNORMAL HIGH (ref 70–99)
Glucose-Capillary: 150 mg/dL — ABNORMAL HIGH (ref 70–99)
Glucose-Capillary: 156 mg/dL — ABNORMAL HIGH (ref 70–99)
Glucose-Capillary: 162 mg/dL — ABNORMAL HIGH (ref 70–99)
Glucose-Capillary: 177 mg/dL — ABNORMAL HIGH (ref 70–99)

## 2019-12-08 LAB — BLOOD GAS, ARTERIAL
Acid-base deficit: 2.9 mmol/L — ABNORMAL HIGH (ref 0.0–2.0)
Acid-base deficit: 3.9 mmol/L — ABNORMAL HIGH (ref 0.0–2.0)
Bicarbonate: 22 mmol/L (ref 20.0–28.0)
Bicarbonate: 21.6 mmol/L (ref 20.0–28.0)
FIO2: 0.35
FIO2: 0.35
MECHVT: 500 mL
MECHVT: 500 mL
Mechanical Rate: 26
Mechanical Rate: 26
O2 Saturation: 88.8 %
O2 Saturation: 91.4 %
PEEP: 5 cmH2O
PEEP: 5 cmH2O
Patient temperature: 37
Patient temperature: 37
RATE: 26 resp/min
pCO2 arterial: 38 mmHg (ref 32.0–48.0)
pCO2 arterial: 40 mmHg (ref 32.0–48.0)
pH, Arterial: 7.37 (ref 7.350–7.450)
pH, Arterial: 7.34 — ABNORMAL LOW (ref 7.350–7.450)
pO2, Arterial: 58 mmHg — ABNORMAL LOW (ref 83.0–108.0)
pO2, Arterial: 66 mmHg — ABNORMAL LOW (ref 83.0–108.0)

## 2019-12-08 LAB — CBC
HCT: 30.4 % — ABNORMAL LOW (ref 39.0–52.0)
Hemoglobin: 9.9 g/dL — ABNORMAL LOW (ref 13.0–17.0)
MCH: 28.4 pg (ref 26.0–34.0)
MCHC: 32.6 g/dL (ref 30.0–36.0)
MCV: 87.1 fL (ref 80.0–100.0)
Platelets: 118 10*3/uL — ABNORMAL LOW (ref 150–400)
RBC: 3.49 MIL/uL — ABNORMAL LOW (ref 4.22–5.81)
RDW: 14.5 % (ref 11.5–15.5)
WBC: 10.4 10*3/uL (ref 4.0–10.5)
nRBC: 1.6 % — ABNORMAL HIGH (ref 0.0–0.2)

## 2019-12-08 LAB — STREP PNEUMONIAE URINARY ANTIGEN: Strep Pneumo Urinary Antigen: POSITIVE — AB

## 2019-12-08 LAB — PROCALCITONIN: Procalcitonin: 50.26 ng/mL

## 2019-12-08 LAB — TRIGLYCERIDES: Triglycerides: 67 mg/dL (ref <150)

## 2019-12-08 LAB — PHOSPHORUS: Phosphorus: 5.1 mg/dL — ABNORMAL HIGH (ref 2.5–4.6)

## 2019-12-08 LAB — MAGNESIUM: Magnesium: 1.8 mg/dL (ref 1.7–2.4)

## 2019-12-08 MED ORDER — PRO-STAT SUGAR FREE PO LIQD
30.0000 mL | Freq: Three times a day (TID) | ORAL | Status: DC
  Administered 2019-12-08 – 2019-12-11 (×9): 30 mL

## 2019-12-08 MED ORDER — VITAL HIGH PROTEIN PO LIQD: 1000.0000 mL | ORAL | Status: DC

## 2019-12-08 MED ORDER — AMIODARONE LOAD VIA INFUSION
150.0000 mg | Freq: Once | INTRAVENOUS | Status: DC
  Filled 2019-12-08: qty 83.34

## 2019-12-08 MED ORDER — PROPOFOL 1000 MG/100ML IV EMUL
5.0000 ug/kg/min | INTRAVENOUS | Status: DC
  Administered 2019-12-08: 50 ug/kg/min via INTRAVENOUS
  Administered 2019-12-08: 20:00:00 40 ug/kg/min via INTRAVENOUS
  Administered 2019-12-09: 05:00:00 50 ug/kg/min via INTRAVENOUS
  Filled 2019-12-08 (×3): qty 100

## 2019-12-08 MED ORDER — VITAL 1.5 CAL PO LIQD
1000.0000 mL | ORAL | Status: DC
  Administered 2019-12-08 – 2019-12-11 (×4): 1000 mL

## 2019-12-08 MED ORDER — AMIODARONE HCL IN DEXTROSE 360-4.14 MG/200ML-% IV SOLN
60.0000 mg/h | INTRAVENOUS | Status: DC
  Filled 2019-12-08: qty 200

## 2019-12-08 MED ORDER — FENTANYL CITRATE (PF) 100 MCG/2ML IJ SOLN
50.0000 ug | INTRAMUSCULAR | Status: DC | PRN
  Administered 2019-12-08 – 2019-12-09 (×2): 50 ug via INTRAVENOUS
  Administered 2019-12-09: 100 ug via INTRAVENOUS
  Filled 2019-12-08 (×2): qty 2

## 2019-12-08 MED ORDER — AMIODARONE HCL IN DEXTROSE 360-4.14 MG/200ML-% IV SOLN
30.0000 mg/h | INTRAVENOUS | Status: DC
  Administered 2019-12-08 – 2019-12-11 (×7): 30 mg/h via INTRAVENOUS
  Filled 2019-12-08 (×7): qty 200

## 2019-12-08 MED ORDER — VECURONIUM BROMIDE 10 MG IV SOLR: 10.0000 mg | Freq: Once | INTRAVENOUS | Status: DC

## 2019-12-08 MED ORDER — MAGNESIUM SULFATE 2 GM/50ML IV SOLN
2.0000 g | Freq: Once | INTRAVENOUS | Status: AC
  Administered 2019-12-08: 06:00:00 2 g via INTRAVENOUS
  Filled 2019-12-08: qty 50

## 2019-12-08 NOTE — Progress Notes (Signed)
Pt briefly went into afib rvr rhythm.  Amio initially ordered however, rhythm returned to low ST after about 30 minutes.  Will hold on amiodarone orders at this time.

## 2019-12-08 NOTE — Procedures (Signed)
ELECTROENCEPHALOGRAM REPORT   Patient: Richard [name]       Room #: IC05A-AA EEG No. ID: [id number] Age: 64 y.o.        Sex: male Requesting Physician: Kasa Report Date:  12/08/2019        Interpreting Physician: [name]  History: Richard [name] is an 64 y.o. male with polysubstance abuse and septic shock  Medications:  Fentanyl, Versed, Pitressin, Amiodarone, Insulin, Solucortef, Pepcid, Unasyn, Amiodarone  Conditions of Recording:  This is a 21 channel routine scalp EEG performed with bipolar and monopolar montages arranged in accordance to the international 10/20 system of electrode placement. One channel was dedicated to EKG recording.  The patient is in the intubated and sedated state.  Description:  The background activity is slow and poorly organized.  It consists of a low voltage polymorphic delta activity that is continuous and diffusely distributed.  Also noted are occasional intermittent periodic discharges of triphasic morphology.   Hyperventilation and intermittent photic stimulation were not performed.    IMPRESSION: This is an abnormal electroencephalogram due to general background slowing and intermittent triphasic waves.  These are seen most commonly in encephalopathic states.  Although most often seen with hepatic encephalopathies, it is not isolated to this clinical scenario.   [name], MD Neurology [phone] 12/08/2019, 6:17 PM

## 2019-12-08 NOTE — Progress Notes (Signed)
eeg completed ° °

## 2019-12-08 NOTE — Progress Notes (Signed)
Pharmacy Electrolyte Monitoring Consult:  Pharmacy consulted to assist in monitoring and replacing electrolytes in this 64 y.o. male admitted on 12/12/2019 with cardiac arrest and respiratory failure secondary to suspected intentional overdose. Patient in DKA.   Labs:  Sodium (mmol/L)  Date Value  12/08/2019 144   Potassium (mmol/L)  Date Value  12/08/2019 5.1   Magnesium (mg/dL)  Date Value  12/08/2019 1.8   Phosphorus (mg/dL)  Date Value  12/08/2019 5.1 (H)   Calcium (mg/dL)  Date Value  12/08/2019 7.4 (L)   Albumin (g/dL)  Date Value  12/09/2019 2.0 (L)  12/02/2019 2.6 (L)   Corrected calcium 9 mg/dL    Assessment/Plan: Patient off insulin drip and bicarb drip. Potassium slightly increased and renal function worse.   Will follow up electrolytes with morning labs.  Will replace for goal potassium ~ 4 and magnesium ~ 2.   Pharmacy will continue to monitor and adjust per consult.   Tawnya Crook, PharmD 12/08/2019 11:36 AM                          ,

## 2019-12-08 NOTE — Progress Notes (Signed)
CRITICAL CARE NOTE  BRIEF DISCUSSION: [name] admitted with Septic shock, DKA, and severe Acute Hypoxic Hypercapnic Respiratory Failure secondary to drug overdose (cocaine & Benzo's) and aspiration pneumonia.   Was intubated in the ED.  Now with AKI.   CC  follow up respiratory failure  SUBJECTIVE Patient remains critically ill Prognosis is guarded   Vent Mode: PRVC FiO2 (%):  [35 %-80 %] 35 % Set Rate:  [26 bmp] 26 bmp Vt Set:  [500 mL] 500 mL PEEP:  [5 cmH20] 5 cmH20 Plateau Pressure:  [18 cmH20] 18 cmH20  CBC    Component Value Date/Time   WBC 10.4 12/08/2019 0340   RBC 3.49 (L) 12/08/2019 0340   HGB 9.9 (L) 12/08/2019 0340   HCT 30.4 (L) 12/08/2019 0340   PLT 118 (L) 12/08/2019 0340   MCV 87.1 12/08/2019 0340   MCH 28.4 12/08/2019 0340   MCHC 32.6 12/08/2019 0340   RDW 14.5 12/08/2019 0340   LYMPHSABS 0.5 (L) 12/09/2019 1459   MONOABS 0.2 12/11/2019 1459   EOSABS 0.0 12/04/2019 1459   BASOSABS 0.0 11/30/2019 1459   BMP Latest Ref Rng & Units 12/08/2019 12/07/2019 12/07/2019  Glucose 70 - 99 mg/dL 189(H) 128(H) 124(H)  BUN 8 - 23 mg/dL 35(H) 31(H) 28(H)  Creatinine 0.61 - 1.24 mg/dL 2.55(H) 2.04(H) 2.11(H)  Sodium 135 - 145 mmol/L 144 145 143  Potassium 3.5 - 5.1 mmol/L 5.1 4.5 3.9  Chloride 98 - 111 mmol/L 110 114(H) 114(H)  CO2 22 - 32 mmol/L _0 Calcium 8.9 - 10.3 mg/dL 7.4(L) 7.5(L) 7.6(L)     BP 107/72   Pulse (!) 106   Temp 99.5 F (37.5 C) (Bladder)   Resp (!) 27   Ht 6' (1.829 m)   Wt 61.7 kg   SpO2 95%   BMI 18.45 kg/m    I/O last 3 completed shifts: In: 5258.4 [I.V.:3706; IV Piggyback:1552.3] Out: 190 [Urine:190] No intake/output data recorded.  SpO2: 95 % O2 Flow Rate (L/min): 15 L/min FiO2 (%): 35 %   SIGNIFICANT EVENTS 2/14 admitted to ICU for severe resp failulre 2/14 CVL placed 2/15 remains on vent-multiorgan failure  REVIEW OF SYSTEMS  PATIENT IS UNABLE TO PROVIDE COMPLETE REVIEW OF SYSTEMS DUE TO SEVERE  CRITICAL ILLNESS   PHYSICAL EXAMINATION:  GENERAL:critically ill appearing, +resp distress HEAD: Normocephalic, atraumatic.  EYES: Pupils equal, round, reactive to light.  No scleral icterus.  MOUTH: Moist mucosal membrane. NECK: Supple.  PULMONARY: +rhonchi, +wheezing CARDIOVASCULAR: S1 and S2. Regular rate and rhythm. No murmurs, rubs, or gallops.  GASTROINTESTINAL: Soft, nontender, -distended.  Positive bowel sounds.   MUSCULOSKELETAL: No swelling, clubbing, or edema.  NEUROLOGIC: obtunded, GCS<8 SKIN:intact,warm,dry  MEDICATIONS: I have reviewed all medications and confirmed regimen as documented   CULTURE RESULTS   Recent Results (from the past 240 hour(s))  Culture, blood (routine x 2)     Status: None (Preliminary result)   Collection Time: 12/02/2019  1:01 PM   Specimen: BLOOD  Result Value Ref Range Status   Specimen Description BLOOD BLOOD RIGHT FOREARM  Final   Special Requests   Final    BOTTLES DRAWN AEROBIC AND ANAEROBIC Blood Culture adequate volume   Culture   Final    NO GROWTH < 24 HOURS Performed at [facility], [address] [place], [place] [address]    Report Status PENDING  Incomplete  Respiratory Panel by RT PCR (Flu A&B, Covid) - Nasopharyngeal Swab     Status: None  Collection Time: 12/11/2019  2:35 PM   Specimen: Nasopharyngeal Swab  Result Value Ref Range Status   SARS Coronavirus 2 by RT PCR NEGATIVE NEGATIVE Final    Comment: (NOTE) SARS-CoV-2 target nucleic acids are NOT DETECTED. The SARS-CoV-2 RNA is generally detectable in upper respiratoy specimens during the acute phase of infection. The lowest concentration of SARS-CoV-2 viral copies this assay can detect is 131 copies/mL. A negative result does not preclude SARS-Cov-2 infection and should not be used as the sole basis for treatment or other patient management decisions. A negative result may occur with  improper specimen collection/handling, submission of specimen  other than nasopharyngeal swab, presence of viral mutation(s) within the areas targeted by this assay, and inadequate number of viral copies (<131 copies/mL). A negative result must be combined with clinical observations, patient history, and epidemiological information. The expected result is Negative. Fact Sheet for Patients:  PinkCheek.be Fact Sheet for Healthcare Providers:  GravelBags.it This test is not yet ap proved or cleared by the [place] FDA and  has been authorized for detection and/or diagnosis of SARS-CoV-2 by FDA under an Emergency Use Authorization (EUA). This EUA will remain  in effect (meaning this test can be used) for the duration of the COVID-19 declaration under Section 564(b)(1) of the Act, 21 U.S.C. section 360bbb-3(b)(1), unless the authorization is terminated or revoked sooner.    Influenza A by PCR NEGATIVE NEGATIVE Final   Influenza B by PCR NEGATIVE NEGATIVE Final    Comment: (NOTE) The Xpert Xpress SARS-CoV-2/FLU/RSV assay is intended as an aid in  the diagnosis of influenza from Nasopharyngeal swab specimens and  should not be used as a sole basis for treatment. Nasal washings and  aspirates are unacceptable for Xpert Xpress SARS-CoV-2/FLU/RSV  testing. Fact Sheet for Patients: PinkCheek.be Fact Sheet for Healthcare Providers: GravelBags.it This test is not yet approved or cleared by the [place] FDA and  has been authorized for detection and/or diagnosis of SARS-CoV-2 by  FDA under an Emergency Use Authorization (EUA). This EUA will remain  in effect (meaning this test can be used) for the duration of the  Covid-19 declaration under Section 564(b)(1) of the Act, 21  U.S.C. section 360bbb-3(b)(1), unless the authorization is  terminated or revoked. Performed at [facility], [address] [place], [place]  [address]   MRSA PCR Screening     Status: Abnormal   Collection Time: 12/07/19 11:17 AM   Specimen: Nasopharyngeal  Result Value Ref Range Status   MRSA by PCR POSITIVE (A) NEGATIVE Final    Comment:        The GeneXpert MRSA Assay (FDA approved for NASAL specimens only), is one component of a comprehensive MRSA colonization surveillance program. It is not intended to diagnose MRSA infection nor to guide or monitor treatment for MRSA infections. RESULT CALLED TO, READ BACK BY AND VERIFIED WITH: Netta Neat RN AT 3662 ON 12/07/19 [facility] Performed at [facility], [address] [place], [place] [address]           IMAGING    DG Chest Port 1 View  Result Date: 12/08/2019 CLINICAL DATA:  Acute respiratory failure. EXAM: PORTABLE CHEST 1 VIEW COMPARISON:  Multiple radiographs 11/23/2019 FINDINGS: Endotracheal tube tip 4.1 cm from the carina. Enteric tube in place with tip and side-port below the diaphragm. Right central line tip at the atrial caval junction. Progressive opacities throughout the right hemithorax. Question of developing cavitation in the right upper lobe. Additional patchy airspace  disease in the left lung is not changed. Normal heart size with unchanged mediastinal contours. Aortic atherosclerosis. Suspected small right pleural effusion. No pneumothorax. IMPRESSION: 1. Progressive opacities throughout the right hemithorax. Question of developing cavitation in the right upper lobe. Additional patchy airspace disease in the left lung is not significantly changed. 2. Support apparatus unchanged. Electronically Signed   By: Keith Rake M.D.   On: 12/08/2019 02:01   ECHOCARDIOGRAM COMPLETE  Result Date: 12/07/2019    ECHOCARDIOGRAM REPORT   Patient Name:   Richard [name] Date of Exam: 12/07/2019 Medical Rec #:  [id number]    Height:       72.0 in Accession #:    [id number]   Weight:       110.0 lb Date of Birth:  Mar 12, 1956    BSA:          1.65 m Patient Age:    75  years     BP:           82/52 mmHg Patient Gender: M            HR:           104 bpm. Exam Location:  [facility] Procedure: Limited Color Doppler, Cardiac Doppler and 2D Echo Indications:     R06.89 Acute Respiratory Insufficiency  History:         Patient has prior history of Echocardiogram examinations. CHF,                  CAD, Arrythmias:Atrial Fibrillation; Risk Factors:Hypertension                  and Diabetes.  Sonographer:     Charmayne Sheer RDCS (AE) Referring Phys:  [id number] Bradly Bienenstock Diagnosing Phys: Kathlyn Sacramento MD  Sonographer Comments: Echo performed with patient supine and on artificial respirator, no parasternal window and suboptimal apical window. Image acquisition challenging due to patient body habitus. IMPRESSIONS  1. Left ventricular ejection fraction, by estimation, is 40 to 45%. The left ventricle has mildly decreased function. LV endocardial border not optimally defined to evaluate regional wall motion. Left ventricular diastolic parameters are indeterminate.  2. Right ventricular systolic function was not well visualized. The right ventricular size is normal. Tricuspid regurgitation signal is inadequate for assessing PA pressure.  3. The mitral valve is normal in structure and function. No evidence of mitral valve regurgitation. No evidence of mitral stenosis.  4. The aortic valve is normal in structure and function. Aortic valve regurgitation is not visualized. No aortic stenosis is present.  5. The inferior vena cava is normal in size with <50% respiratory variability, suggesting right atrial pressure of 8 mmHg.  6. very limited and challenging images. FINDINGS  Left Ventricle: Left ventricular ejection fraction, by estimation, is 40 to 45%. The left ventricle has mildly decreased function. LV endocardial border not optimally defined to evaluate regional wall motion. The left ventricular internal cavity size was normal in size. There is no left ventricular hypertrophy. Left ventricular  diastolic parameters are indeterminate. Right Ventricle: The right ventricular size is normal. No increase in right ventricular wall thickness. Right ventricular systolic function was not well visualized. Tricuspid regurgitation signal is inadequate for assessing PA pressure. Left Atrium: Left atrial size was normal in size. Right Atrium: Right atrial size was normal in size. Pericardium: Trivial pericardial effusion is present. Mitral Valve: The mitral valve is normal in structure and function. Normal mobility of the mitral valve leaflets. No evidence of mitral valve  regurgitation. No evidence of mitral valve stenosis. MV peak gradient, 0.9 mmHg. The mean mitral valve gradient is 1.0 mmHg. Tricuspid Valve: The tricuspid valve is normal in structure. Tricuspid valve regurgitation is not demonstrated. No evidence of tricuspid stenosis. Aortic Valve: The aortic valve is normal in structure and function. Aortic valve regurgitation is not visualized. No aortic stenosis is present. Aortic valve mean gradient measures 2.0 mmHg. Aortic valve peak gradient measures 2.9 mmHg. Pulmonic Valve: The pulmonic valve was normal in structure. Pulmonic valve regurgitation is not visualized. No evidence of pulmonic stenosis. Aorta: The aortic root is normal in size and structure. Venous: The inferior vena cava is normal in size with less than 50% respiratory variability, suggesting right atrial pressure of 8 mmHg. IAS/Shunts: No atrial level shunt detected by color flow Doppler.   Diastology LV e' lateral:   7.29 cm/s LV E/e' lateral: 5.9 LV e' medial:    4.24 cm/s LV E/e' medial:  10.1  AORTIC VALVE                   PULMONIC VALVE AV Vmax:           85.30 cm/s  PV Vmax:       0.88 m/s AV Vmean:          58.900 cm/s PV Vmean:      57.400 cm/s AV VTI:            0.105 m     PV VTI:        0.119 m AV Peak Grad:      2.9 mmHg    PV Peak grad:  3.1 mmHg AV Mean Grad:      2.0 mmHg    PV Mean grad:  2.0 mmHg LVOT Vmax:         70.90 cm/s  LVOT Vmean:        53.900 cm/s LVOT VTI:          0.090 m LVOT/AV VTI ratio: 0.85 MITRAL VALVE MV Area (PHT): 3.48 cm    SHUNTS MV Peak grad:  0.9 mmHg    Systemic VTI: 0.09 m MV Mean grad:  1.0 mmHg MV Vmax:       0.46 m/s MV Vmean:      37.4 cm/s MV Decel Time: 218 msec MV E velocity: 42.70 cm/s MV A velocity: 40.30 cm/s MV E/A ratio:  1.06 Kathlyn Sacramento MD Electronically signed by Kathlyn Sacramento MD Signature Date/Time: 12/07/2019/10:29:12 AM    Final        Indwelling Urinary Catheter continued, requirement due to   Reason to continue Indwelling Urinary Catheter strict Intake/Output monitoring for hemodynamic instability   Central Line/ continued, requirement due to  Reason to continue [place] of central venous pressure or other hemodynamic parameters and poor IV access   Ventilator continued, requirement due to severe respiratory failure   Ventilator Sedation RASS 0 to -2    CULTURES: SARS-CoV-2 PCR 2/14>> negative Influenza PCR 2/14>>negative Blood culture 2/14>> Tracheal aspirate 2/15>> Strep pneumo urinary antigen 2/15>> Legionella urinary antigen 2/15>>  ANTIBIOTICS: Cefepime 2/14>>2/14 Flagyl 2/14>>2/14 Vancomycin 2/14>>2/14 Unasyn 2/14>>  SIGNIFICANT EVENTS: 2/14>> Presented to ED 2/14>> No ICU beds available at [facility], to be transferred to [facility] 2/15>>No ICU beds available at [facility], will be admitted to [facility] ICU as beds now available  LINES/TUBES: ET Tube 2/14>> Right IJ CVC (placed by ED provider) 2/14>>   ASSESSMENT AND PLAN SYNOPSIS   Severe ACUTE Hypoxic  and Hypercapnic Respiratory Failure From acute drig ABUSE AND cocaine abuse with aspiration pneumonia -continue Full MV support -continue Bronchodilator Therapy -Wean Fio2 and PEEP as tolerated -will perform SAT/SBT when respiratory parameters are met   Septic shock -use vasopressors to keep MAP>65 -follow ABG and LA -follow up cultures -emperic ABX -consider stress  dose steroids -aggressive IV fluid Resuscitation Check ECHO     ACUTE KIDNEY INJURY/Renal Failure -follow chem 7 -follow UO -continue Foley Catheter-assess need -Avoid nephrotoxic agents -Recheck creatinine     NEUROLOGY - intubated and sedated - minimal sedation to achieve a RASS goal: -1 Wake up assessment pending  CARDIAC ICU monitoring  ID -continue IV abx as prescibed -follow up cultures  GI GI PROPHYLAXIS as indicated  NUTRITIONAL STATUS DIET-->TF's as tolerated Constipation protocol as indicated  ENDO - will use ICU hypoglycemic\Hyperglycemia protocol if indicated   ELECTROLYTES -follow labs as needed -replace as needed -pharmacy consultation and following   DVT/GI PRX ordered TRANSFUSIONS AS NEEDED MONITOR FSBS ASSESS the need for LABS as needed   Critical Care Time devoted to patient care services described in this note is 34 minutes.   Overall, patient is critically ill, prognosis is guarded.  Patient with Multiorgan failure and at high risk for cardiac arrest and death.    Corrin Parker, M.D.  [facility]  Medical Director [place] Director [facility] Cardio-Pulmonary Department

## 2019-12-08 NOTE — Progress Notes (Signed)
Initial Nutrition Assessment  DOCUMENTATION CODES:   Severe malnutrition in context of chronic illness, Underweight  INTERVENTION:  Initiate Vital 1.5 Cal at 15 mL/hr and advance by 15 mL/hr every 8 hours to goal rate of 45 mL/hr (1080 mL goal daily volume). Also provide Pro-Stat 30 mL TID per tube. Goal TF regimen provides 1920 kcal, 118 grams of protein, 821 mL H2O daily.  Provide minimum free water flush of 30 mL Q4hrs to maintain tube patency.  Monitor magnesium, potassium, and phosphorus daily for at least 3 days, MD to replete as needed, as pt is at risk for refeeding syndrome given severe malnutrition.  NUTRITION DIAGNOSIS:   Severe Malnutrition related to chronic illness(CHF, hx pancreatic cancer s/p Whipple procedure, substance abuse) as evidenced by severe fat depletion, severe muscle depletion.  GOAL:   Provide needs based on ASPEN/SCCM guidelines  MONITOR:   Vent status, Labs, Weight trends, TF tolerance, Skin, I & O's  REASON FOR ASSESSMENT:   Ventilator    ASSESSMENT:   64 year old male with PMHx of HTN, DM, CAD, neuropathy, CHF, A-fib admitted with septic shock, hx pancreatic cancer s/p Whipple procedure, substance abuse, DKA, severe acute respiratory failure secondary to drug overdose and aspiration PNA s/p intubation and mechanical ventilation on 2/14, also with AKI.   Patient is currently intubated on ventilator support MV: 13.3 L/min Temp (24hrs), Avg:99.6 F (37.6 C), Min:98.1 F (36.7 C), Max:100.2 F (37.9 C)  Propofol: N/A  Medications reviewed and include: amiodarone, Solu-Cortef 50 mg Q6hrs IV, Novolog 0-9 units Q4hrs, Lantus 20 units daily, Unasyn, famotidine, Levophed gtt now off, vasopressin gtt at 0.04 units/min.  Labs reviewed: CBG 134-177, BUN 35, Creatinine 2.55, Phosphorus 5.1.  Enteral Access: 18 Fr. OGT  Discussed with RN and on rounds. Plan is to start tube feeds today.  NUTRITION - FOCUSED PHYSICAL EXAM:    Most Recent Value   Orbital Region  Severe depletion  Upper Arm Region  Unable to assess [edema]  Thoracic and Lumbar Region  Severe depletion  Buccal Region  Unable to assess  Temple Region  Severe depletion  Clavicle Bone Region  Severe depletion  Clavicle and Acromion Bone Region  Severe depletion  Scapular Bone Region  Unable to assess  Dorsal Hand  Unable to assess [edema]  Patellar Region  Severe depletion  Anterior Thigh Region  Severe depletion  Posterior Calf Region  Severe depletion  Edema (RD Assessment)  Moderate [bilateral upper extremities]  Hair  Reviewed  Eyes  Unable to assess  Mouth  Unable to assess  Skin  Reviewed  Nails  Reviewed     Diet Order:   Diet Order            Diet NPO time specified  Diet effective now             EDUCATION NEEDS:   No education needs have been identified at this time  Skin:  Skin Assessment: Skin Integrity Issues:(stg II buttocks (1cm x 1cm x 0.2cm); laceration front of leg (?which one))  Last BM:  12/07/2019 per chart  Height:   Ht Readings from Last 1 Encounters:  12/05/2019 6' (1.829 m)   Weight:   Wt Readings from Last 1 Encounters:  12/08/19 61.7 kg   Ideal Body Weight:  80.9 kg  BMI:  Body mass index is 18.45 kg/m.  Estimated Nutritional Needs:   Kcal:  1922 (PSU 2003b w/ MSJ 1450, Ve 13.3, Tmax 37.9)  Protein:  105-120 grams  Fluid:  1.8-2 L/day  Jacklynn Barnacle, MS, RD, LDN Pager number available on Amion

## 2019-12-08 NOTE — Consult Note (Signed)
  Patient remains intubated, unable to be assessed  Psychiatry will continue to follow

## 2019-12-09 DIAGNOSIS — G9341 Metabolic encephalopathy: Secondary | ICD-10-CM

## 2019-12-09 DIAGNOSIS — I5021 Acute systolic (congestive) heart failure: Secondary | ICD-10-CM

## 2019-12-09 LAB — PROCALCITONIN: Procalcitonin: 76.52 ng/mL

## 2019-12-09 LAB — BLOOD GAS, ARTERIAL
Acid-base deficit: 5.1 mmol/L — ABNORMAL HIGH (ref 0.0–2.0)
Bicarbonate: 21.2 mmol/L (ref 20.0–28.0)
FIO2: 0.35
MECHVT: 500 mL
Mechanical Rate: 26
O2 Saturation: 94.4 %
PEEP: 8 cmH2O
Patient temperature: 37
pCO2 arterial: 43 mmHg (ref 32.0–48.0)
pH, Arterial: 7.3 — ABNORMAL LOW (ref 7.350–7.450)
pO2, Arterial: 80 mmHg — ABNORMAL LOW (ref 83.0–108.0)

## 2019-12-09 LAB — CBC WITH DIFFERENTIAL/PLATELET
Abs Immature Granulocytes: 0.1 10*3/uL — ABNORMAL HIGH (ref 0.00–0.07)
Basophils Absolute: 0 10*3/uL (ref 0.0–0.1)
Basophils Relative: 0 %
Eosinophils Absolute: 0 10*3/uL (ref 0.0–0.5)
Eosinophils Relative: 0 %
HCT: 27.7 % — ABNORMAL LOW (ref 39.0–52.0)
Hemoglobin: 9.2 g/dL — ABNORMAL LOW (ref 13.0–17.0)
Immature Granulocytes: 1 %
Lymphocytes Relative: 4 %
Lymphs Abs: 0.4 10*3/uL — ABNORMAL LOW (ref 0.7–4.0)
MCH: 28 pg (ref 26.0–34.0)
MCHC: 33.2 g/dL (ref 30.0–36.0)
MCV: 84.5 fL (ref 80.0–100.0)
Monocytes Absolute: 0.2 10*3/uL (ref 0.1–1.0)
Monocytes Relative: 3 %
Neutro Abs: 8.5 10*3/uL — ABNORMAL HIGH (ref 1.7–7.7)
Neutrophils Relative %: 92 %
Platelets: 99 10*3/uL — ABNORMAL LOW (ref 150–400)
RBC: 3.28 MIL/uL — ABNORMAL LOW (ref 4.22–5.81)
RDW: 14.7 % (ref 11.5–15.5)
WBC: 9.3 10*3/uL (ref 4.0–10.5)
nRBC: 4.8 % — ABNORMAL HIGH (ref 0.0–0.2)

## 2019-12-09 LAB — GLUCOSE, CAPILLARY
Glucose-Capillary: 111 mg/dL — ABNORMAL HIGH (ref 70–99)
Glucose-Capillary: 128 mg/dL — ABNORMAL HIGH (ref 70–99)
Glucose-Capillary: 134 mg/dL — ABNORMAL HIGH (ref 70–99)
Glucose-Capillary: 139 mg/dL — ABNORMAL HIGH (ref 70–99)
Glucose-Capillary: 139 mg/dL — ABNORMAL HIGH (ref 70–99)
Glucose-Capillary: 155 mg/dL — ABNORMAL HIGH (ref 70–99)
Glucose-Capillary: 294 mg/dL — ABNORMAL HIGH (ref 70–99)

## 2019-12-09 LAB — CK: Total CK: 845 U/L — ABNORMAL HIGH (ref 49–397)

## 2019-12-09 LAB — PATHOLOGIST SMEAR REVIEW

## 2019-12-09 LAB — BASIC METABOLIC PANEL
Anion gap: 8 (ref 5–15)
BUN: 53 mg/dL — ABNORMAL HIGH (ref 8–23)
CO2: 27 mmol/L (ref 22–32)
Calcium: 7.6 mg/dL — ABNORMAL LOW (ref 8.9–10.3)
Chloride: 110 mmol/L (ref 98–111)
Creatinine, Ser: 3.33 mg/dL — ABNORMAL HIGH (ref 0.61–1.24)
GFR calc Af Amer: 21 mL/min — ABNORMAL LOW (ref >60)
GFR calc non Af Amer: 18 mL/min — ABNORMAL LOW (ref >60)
Glucose, Bld: 167 mg/dL — ABNORMAL HIGH (ref 70–99)
Potassium: 4.6 mmol/L (ref 3.5–5.1)
Sodium: 145 mmol/L (ref 135–145)

## 2019-12-09 LAB — MAGNESIUM: Magnesium: 2.3 mg/dL (ref 1.7–2.4)

## 2019-12-09 LAB — PHOSPHORUS: Phosphorus: 6.1 mg/dL — ABNORMAL HIGH (ref 2.5–4.6)

## 2019-12-09 MED ORDER — MIDAZOLAM HCL 2 MG/2ML IJ SOLN: 1.0000 mg | INTRAMUSCULAR | Status: DC | PRN

## 2019-12-09 MED ORDER — PIPERACILLIN-TAZOBACTAM 3.375 G IVPB
3.3750 g | Freq: Three times a day (TID) | INTRAVENOUS | Status: DC
  Administered 2019-12-09 – 2019-12-10 (×2): 3.375 g via INTRAVENOUS
  Filled 2019-12-09 (×2): qty 50

## 2019-12-09 MED ORDER — VECURONIUM BROMIDE 10 MG IV SOLR
10.0000 mg | Freq: Once | INTRAVENOUS | Status: DC
  Filled 2019-12-09: qty 10

## 2019-12-09 MED ORDER — FENTANYL 2500MCG IN NS 250ML (10MCG/ML) PREMIX INFUSION
0.0000 ug/h | INTRAVENOUS | Status: DC
  Administered 2019-12-09: 25 ug/h via INTRAVENOUS
  Administered 2019-12-10: 06:00:00 150 ug/h via INTRAVENOUS
  Administered 2019-12-11 – 2019-12-12 (×4): 200 ug/h via INTRAVENOUS
  Administered 2019-12-13: 400 ug/h via INTRAVENOUS
  Filled 2019-12-09 (×7): qty 250

## 2019-12-09 MED ORDER — VECURONIUM BROMIDE 10 MG IV SOLR: 10.0000 mg | INTRAVENOUS | Status: DC | PRN

## 2019-12-09 NOTE — Progress Notes (Signed)
Given report and found pt to be on PRVC VT 500, Rate 15, Peep 8, FIO2 .35. No order for these settings and no note from MD or RT for these settings. Will confirm orders for pt through NP [facility].

## 2019-12-09 NOTE — Progress Notes (Signed)
Assisted tele visit to patient with family member.  Richard Schoenbeck McEachran, RN  

## 2019-12-09 NOTE — Progress Notes (Signed)
CRITICAL CARE NOTE [name] admitted withSeptic shock, DKA, and severeAcute Hypoxic Hypercapnic Respiratory Failure secondary to drug overdose(cocaine & Benzo's)and aspiration pneumonia. Was intubated in the ED. Now with AKI.  CULTURES: SARS-CoV-2 PCR 2/14>> negative Influenza PCR 2/14>>negative Blood culture 2/14>> Tracheal aspirate 2/15>> Strep pneumo urinary antigen 2/15>> Legionella urinary antigen 2/15>>  ANTIBIOTICS: Cefepime 2/14>>2/14 Flagyl 2/14>>2/14 Vancomycin 2/14>>2/14 Unasyn 2/14>>  SIGNIFICANT EVENTS: 2/14>> Presented to ED 2/14>> No ICU beds available at [facility], to be transferred to [facility] 2/15>>No ICU beds available at [facility], will be admitted to [facility] ICU as beds now available 2/17 SEVERE RESP FAILURE, CONCERNS FOR BRAIN INJURY EEG NOTED, MRI PENDING 2/17 I CALLED SISTER MULTIPLE  TIMES, NO ANSWER AND ALSO I WAS UNABLE TO LEAVE VM  LINES/TUBES: ET Tube 2/14>> Right IJ CVC (placed by ED provider) 2/14>>   CC  follow up respiratory failure  SUBJECTIVE Patient remains critically ill Prognosis is guarded SEVERE RESP FAILURE    BP 115/70   Pulse 72   Temp 99 F (37.2 C)   Resp 17   Ht 6' (1.829 m)   Wt 63.2 kg   SpO2 94%   BMI 18.90 kg/m    I/O last 3 completed shifts: In: 2544 [I.V.:1628.6; NG/GT:557.7; IV Piggyback:357.8] Out: 380 [Urine:380] No intake/output data recorded.  SpO2: 94 % O2 Flow Rate (L/min): 15 L/min FiO2 (%): 35 %     REVIEW OF SYSTEMS  PATIENT IS UNABLE TO PROVIDE COMPLETE REVIEW OF SYSTEMS DUE TO SEVERE CRITICAL ILLNESS   PHYSICAL EXAMINATION:  GENERAL:critically ill appearing, +resp distress HEAD: Normocephalic, atraumatic.  EYES: Pupils equal, round, reactive to light.  No scleral icterus.  MOUTH: Moist mucosal membrane. NECK: Supple.  PULMONARY: +rhonchi, +wheezing CARDIOVASCULAR: S1 and S2. Regular rate and rhythm. No murmurs, rubs, or gallops.  GASTROINTESTINAL: Soft, nontender,  -distended.  Positive bowel sounds.   MUSCULOSKELETAL: No swelling, clubbing, or edema.  NEUROLOGIC: obtunded, GCS<8 SKIN:intact,warm,dry  MEDICATIONS: I have reviewed all medications and confirmed regimen as documented   CULTURE RESULTS   Recent Results (from the past 240 hour(s))  Culture, blood (routine x 2)     Status: None (Preliminary result)   Collection Time: 12/08/2019  1:01 PM   Specimen: BLOOD  Result Value Ref Range Status   Specimen Description BLOOD BLOOD RIGHT FOREARM  Final   Special Requests   Final    BOTTLES DRAWN AEROBIC AND ANAEROBIC Blood Culture adequate volume   Culture   Final    NO GROWTH 3 DAYS Performed at [facility], [address], [place], [place] [address]    Report Status PENDING  Incomplete  Respiratory Panel by RT PCR (Flu A&B, Covid) - Nasopharyngeal Swab     Status: None   Collection Time: 12/08/2019  2:35 PM   Specimen: Nasopharyngeal Swab  Result Value Ref Range Status   SARS Coronavirus 2 by RT PCR NEGATIVE NEGATIVE Final    Comment: (NOTE) SARS-CoV-2 target nucleic acids are NOT DETECTED. The SARS-CoV-2 RNA is generally detectable in upper respiratoy specimens during the acute phase of infection. The lowest concentration of SARS-CoV-2 viral copies this assay can detect is 131 copies/mL. A negative result does not preclude SARS-Cov-2 infection and should not be used as the sole basis for treatment or other patient management decisions. A negative result may occur with  improper specimen collection/handling, submission of specimen other than nasopharyngeal swab, presence of viral mutation(s) within the areas targeted by this assay, and inadequate number of viral  copies (<131 copies/mL). A negative result must be combined with clinical observations, patient history, and epidemiological information. The expected result is Negative. Fact Sheet for Patients:  PinkCheek.be Fact Sheet for Healthcare  Providers:  GravelBags.it This test is not yet ap proved or cleared by the [place] FDA and  has been authorized for detection and/or diagnosis of SARS-CoV-2 by FDA under an Emergency Use Authorization (EUA). This EUA will remain  in effect (meaning this test can be used) for the duration of the COVID-19 declaration under Section 564(b)(1) of the Act, 21 U.S.C. section 360bbb-3(b)(1), unless the authorization is terminated or revoked sooner.    Influenza A by PCR NEGATIVE NEGATIVE Final   Influenza B by PCR NEGATIVE NEGATIVE Final    Comment: (NOTE) The Xpert Xpress SARS-CoV-2/FLU/RSV assay is intended as an aid in  the diagnosis of influenza from Nasopharyngeal swab specimens and  should not be used as a sole basis for treatment. Nasal washings and  aspirates are unacceptable for Xpert Xpress SARS-CoV-2/FLU/RSV  testing. Fact Sheet for Patients: PinkCheek.be Fact Sheet for Healthcare Providers: GravelBags.it This test is not yet approved or cleared by the [place] FDA and  has been authorized for detection and/or diagnosis of SARS-CoV-2 by  FDA under an Emergency Use Authorization (EUA). This EUA will remain  in effect (meaning this test can be used) for the duration of the  Covid-19 declaration under Section 564(b)(1) of the Act, 21  U.S.C. section 360bbb-3(b)(1), unless the authorization is  terminated or revoked. Performed at [facility], [address] [place], [place] [address]   MRSA PCR Screening     Status: Abnormal   Collection Time: 12/07/19 11:17 AM   Specimen: Nasopharyngeal  Result Value Ref Range Status   MRSA by PCR POSITIVE (A) NEGATIVE Final    Comment:        The GeneXpert MRSA Assay (FDA approved for NASAL specimens only), is one component of a comprehensive MRSA colonization surveillance program. It is not intended to diagnose MRSA infection nor  to guide or monitor treatment for MRSA infections. RESULT CALLED TO, READ BACK BY AND VERIFIED WITH: Netta Neat RN AT [id number] ON 12/07/19 SNG Performed at [facility], [address] [place], [place] [address]   Culture, respiratory (non-expectorated)     Status: None (Preliminary result)   Collection Time: 12/08/19  4:44 AM   Specimen: Tracheal Aspirate; Respiratory  Result Value Ref Range Status   Specimen Description   Final    TRACHEAL ASPIRATE Performed at [facility], [address] [place], [place] [address]    Special Requests   Final    NONE Performed at [facility], [place], [place] [address]    Gram Stain FEW WBC PRESENT, PREDOMINANTLY PMN FEW YEAST   Final   Culture   Final    FEW YEAST CULTURE REINCUBATED FOR BETTER GROWTH Performed at [facility], [place] [address] [place], [place] [address]    Report Status PENDING  Incomplete          IMAGING    EEG  Result Date: 12/08/2019 [name], MD     12/08/2019  6:38 PM ELECTROENCEPHALOGRAM REPORT Patient: Daegan Arizmendi       Room #: IC05A-AA EEG No. ID: [id number] Age: 64 y.o.        Sex: male Requesting Physician: Tyreonna Czaplicki Report Date:  12/08/2019       Interpreting Physician: [name] History: Jaremy Nosal is  an 64 y.o. male with polysubstance abuse and septic shock Medications: Fentanyl, Versed, Pitressin, Amiodarone, Insulin, Solucortef, Pepcid, Unasyn, Amiodarone Conditions of Recording:  This is a 21 channel routine scalp EEG performed with bipolar and monopolar montages arranged in accordance to the international 10/20 system of electrode placement. One channel was dedicated to EKG recording. The patient is in the intubated and sedated state. Description:  The background activity is slow and poorly organized.  It consists of a low voltage polymorphic delta activity that is continuous and diffusely distributed.  Also noted are occasional intermittent  periodic discharges of triphasic morphology.  Hyperventilation and intermittent photic stimulation were not performed.  IMPRESSION: This is an abnormal electroencephalogram due to general background slowing and intermittent triphasic waves.  These are seen most commonly in encephalopathic states.  Although most often seen with hepatic encephalopathies, it is not isolated to this clinical scenario. [name], MD Neurology [phone] 12/08/2019, 6:17 PM    CBC    Component Value Date/Time   WBC 9.3 12/09/2019 0443   RBC 3.28 (L) 12/09/2019 0443   HGB 9.2 (L) 12/09/2019 0443   HCT 27.7 (L) 12/09/2019 0443   PLT 99 (L) 12/09/2019 0443   MCV 84.5 12/09/2019 0443   MCH 28.0 12/09/2019 0443   MCHC 33.2 12/09/2019 0443   RDW 14.7 12/09/2019 0443   LYMPHSABS 0.4 (L) 12/09/2019 0443   MONOABS 0.2 12/09/2019 0443   EOSABS 0.0 12/09/2019 0443   BASOSABS 0.0 12/09/2019 0443    BMP Latest Ref Rng & Units 12/09/2019 12/08/2019 12/07/2019  Glucose 70 - 99 mg/dL 167(H) 189(H) 128(H)  BUN 8 - 23 mg/dL 53(H) 35(H) 31(H)  Creatinine 0.61 - 1.24 mg/dL 3.33(H) 2.55(H) 2.04(H)  Sodium 135 - 145 mmol/L 145 144 145  Potassium 3.5 - 5.1 mmol/L 4.6 5.1 4.5  Chloride 98 - 111 mmol/L 110 110 114(H)  CO2 22 - 32 mmol/L _0 Calcium 8.9 - 10.3 mg/dL 7.6(L) 7.4(L) 7.5(L)      Indwelling Urinary Catheter continued, requirement due to   Reason to continue Indwelling Urinary Catheter strict Intake/Output monitoring for hemodynamic instability   Central Line/ continued, requirement due to  Reason to continue [place] of central venous pressure or other hemodynamic parameters and poor IV access   Ventilator continued, requirement due to severe respiratory failure   Ventilator Sedation RASS 0 to -2      ASSESSMENT AND PLAN SYNOPSIS    Severe ACUTE Hypoxic and Hypercapnic Respiratory Failure From acute drig ABUSE AND cocaine abuse with aspiration pneumonia -continue Full MV  support -continue Bronchodilator Therapy -Wean Fio2 and PEEP as tolerated -will perform SAT/SBT when respiratory parameters are met   ACUTE KIDNEY INJURY/Renal Failure -follow chem 7 -follow UO -continue Foley Catheter-assess need -Avoid nephrotoxic agents -Recheck creatinine  CREATININE WORSENING CHECK CK LEVELS   NEUROLOGY - intubated and sedated - minimal sedation to achieve a RASS goal: -1 MRI PENDING, FOLLOW UP NEUROLOGY CONSULTATION   SHOCK-SEPSIS/HYPOVOLUMIC/CARDIOGENIC -use vasopressors to keep MAP>65 -follow ABG and LA -follow up cultures -emperic ABX   CARDIAC ICU monitoring  ID -continue IV abx as prescibed -follow up cultures  GI GI PROPHYLAXIS as indicated  NUTRITIONAL STATUS DIET-->TF's as tolerated Constipation protocol as indicated  ENDO - will use ICU hypoglycemic\Hyperglycemia protocol if indicated   ELECTROLYTES -follow labs as needed -replace as needed -pharmacy consultation and following   DVT/GI PRX ordered TRANSFUSIONS AS NEEDED MONITOR FSBS ASSESS the need for LABS as needed   Critical  Care Time devoted to patient care services described in this note is 32 minutes.   Overall, patient is critically ill, prognosis is guarded.  Patient with Multiorgan failure and at high risk for cardiac arrest and death.     Corrin Parker, M.D.  [facility]  Medical Director [place] Director [name] Cardio-Pulmonary Department

## 2019-12-09 NOTE — Progress Notes (Signed)
Pharmacy Electrolyte Monitoring Consult:  Pharmacy consulted to assist in monitoring and replacing electrolytes in this 64 y.o. male admitted on 11/30/2019 with cardiac arrest and respiratory failure secondary to suspected intentional overdose. Patient in DKA.   Labs:  Sodium (mmol/L)  Date Value  12/09/2019 145   Potassium (mmol/L)  Date Value  12/09/2019 4.6   Magnesium (mg/dL)  Date Value  12/09/2019 2.3   Phosphorus (mg/dL)  Date Value  12/09/2019 6.1 (H)   Calcium (mg/dL)  Date Value  12/09/2019 7.6 (L)   Albumin (g/dL)  Date Value  12/08/2019 2.0 (L)  12/19/2019 2.6 (L)   Corrected calcium 9 mg/dL    Assessment/Plan: Patient off insulin drip and bicarb drip.   K and Mg at goal. Renal function worse (Scr 2.55 >> 3.33)   No additional repletion needed at this time. Will follow up electrolytes with morning labs.  Will replace for goal potassium ~ 4 and magnesium ~ 2.   Pharmacy will continue to monitor and adjust per consult.   Faylene Million,  Pharmacy Student 12/09/2019 10:54 AM                          ,

## 2019-12-09 NOTE — Consult Note (Signed)
Reason for Consult:AMS Requesting Physician: Kasa  CC: AMS  I have been asked by Dr. Mortimer Fries to see this patient in consultation for AMS s/p arrest.  HPI: Richard [name] is an 64 y.o. male who is intubated and sedated and therefore unable to provide any history.  All history obtained from the chart.  Patient admitted withSeptic shock, DKA, and severeAcute Hypoxic Hypercapnic Respiratory Failure secondary to drug overdose(cocaine & Benzo's)and aspiration pneumonia. Was intubated in the ED. Now with AKI.  Does not tolerate attempts to wean sedation.    Past Medical History:  Diagnosis Date  . Atrial fibrillation ([place])    per patient  . Cancer ([place])   . CHF (congestive heart failure) ([place])   . Chronic back pain   . Chronic leg pain   . Coronary artery disease   . Diabetes mellitus without complication ([place])   . Hypertension   . Neuropathy     Past Surgical History:  Procedure Laterality Date  . AMPUTATION Left 03/13/2019   Procedure: AMPUTATION BELOW KNEE;  Surgeon: Algernon Huxley, MD;  Location: [facility] ORS;  Service: Vascular;  Laterality: Left;  . CORONARY ANGIOPLASTY WITH STENT PLACEMENT    . EMBOLECTOMY Left 03/12/2019   Procedure: EMBOLECTOMY SFA POPLITEAL;  Surgeon: Algernon Huxley, MD;  Location: [facility] ORS;  Service: Vascular;  Laterality: Left;  . ENDARTERECTOMY FEMORAL Left 03/12/2019   Procedure: ENDARTERECTOMY FEMORAL;  Surgeon: Algernon Huxley, MD;  Location: [facility] ORS;  Service: Vascular;  Laterality: Left;  . ESOPHAGOGASTRODUODENOSCOPY N/A 09/11/2017   Procedure: ESOPHAGOGASTRODUODENOSCOPY (EGD);  Surgeon: Virgel Manifold, MD;  Location: [facility] ENDOSCOPY;  Service: Endoscopy;  Laterality: N/A;  . LOWER EXTREMITY ANGIOGRAPHY Left 11/17/2018   Procedure: Lower Extremity Angiography, with possible intervention;  Surgeon: Algernon Huxley, MD;  Location: [facility];  Service: Cardiovascular;  Laterality: Left;  . LOWER EXTREMITY ANGIOGRAPHY Left 03/04/2019   Procedure: Lower  Extremity Angiography;  Surgeon: Algernon Huxley, MD;  Location: [facility];  Service: Cardiovascular;  Laterality: Left;  . LOWER EXTREMITY ANGIOGRAPHY Left 07/01/2019   Procedure: LOWER EXTREMITY ANGIOGRAPHY;  Surgeon: Algernon Huxley, MD;  Location: [facility];  Service: Cardiovascular;  Laterality: Left;  . pancreas removed      Family History  Problem Relation Age of Onset  . CAD Mother   . Diabetes Mellitus II Mother   . Diabetes Mellitus II Father     Social History:  reports that he has been smoking. He has a 25.00 pack-year smoking history. He has never used smokeless tobacco. He reports current drug use. Drugs: Cocaine and Benzodiazepines. He reports that he does not drink alcohol.  No Known Allergies  Medications:  I have reviewed the patient's current medications. Prior to Admission:  Medications Prior to Admission  Medication Sig Dispense Refill Last Dose  . ACCU-CHEK AVIVA PLUS test strip U ONCE TO BID UTD 100 each 5   . Accu-Chek Softclix Lancets lancets TEST 1-2 XD 100 each 5   . apixaban (ELIQUIS) 5 MG TABS tablet Take 1 tablet (5 mg total) by mouth every 12 (twelve) hours. 60 tablet 1 Past Week at Unknown time  . carvedilol (COREG) 6.25 MG tablet Take 1 tablet (6.25 mg total) by mouth 2 (two) times daily with a meal. 60 tablet 1 Past Week at Unknown time  . escitalopram (LEXAPRO) 5 MG tablet Take 1 tablet (5 mg total) by mouth daily. 30 tablet 1 Past Week at Unknown time  .  famotidine (PEPCID) 20 MG tablet TAKE 1 TABLET BY MOUTH TWICE DAILY (Patient taking differently: Take 20 mg by mouth 2 (two) times daily. ) 180 tablet 0 Past Week at Unknown time  . gabapentin (NEURONTIN) 300 MG capsule Take 1 capsule (300 mg total) by mouth 3 (three) times daily. 90 capsule 1 Past Week at Unknown time  . insulin aspart (NOVOLOG) 100 UNIT/ML injection Inject 12 Units into the skin 3 (three) times daily with meals. 10 mL 11 Past Week at Unknown time  . insulin glargine  (LANTUS) 100 UNIT/ML injection Inject 0.25 mLs (25 Units total) into the skin at bedtime. 10 mL 11 Past Week at Unknown time  . mirtazapine (REMERON) 45 MG tablet Take 1 tablet (45 mg total) by mouth at bedtime. 30 tablet 1 Past Week at Unknown time  . rosuvastatin (CRESTOR) 40 MG tablet Take 1 tablet (40 mg total) by mouth daily at 6 PM. 30 tablet 1 Past Week at Unknown time  . temazepam (RESTORIL) 15 MG capsule Take 1 capsule (15 mg total) by mouth at bedtime. 30 capsule 1 Past Week at Unknown time  . thiamine 100 MG tablet Take 1 tablet (100 mg total) by mouth daily. 30 tablet 1 Past Week at Unknown time  . traZODone (DESYREL) 50 MG tablet Take 1 tablet (50 mg total) by mouth at bedtime. 30 tablet 1 Past Week at Unknown time   Scheduled: . amiodarone  150 mg Intravenous Once  . chlorhexidine gluconate (MEDLINE KIT)  15 mL Mouth Rinse BID  . Chlorhexidine Gluconate Cloth  6 each Topical Daily  . feeding supplement (PRO-STAT SUGAR FREE 64)  30 mL Per Tube TID  . heparin  5,000 Units Subcutaneous Q8H  . hydrocortisone sod succinate (SOLU-CORTEF) inj  50 mg Intravenous Q6H  . insulin aspart  0-9 Units Subcutaneous Q4H  . insulin glargine  20 Units Subcutaneous Daily  . mouth rinse  15 mL Mouth Rinse 10 times per day  . mupirocin ointment  1 application Nasal BID    ROS: Unable to obtain due to mental status  Physical Examination: Blood pressure (!) 141/81, pulse 81, temperature 99 F (37.2 C), resp. rate 17, height 6' (1.829 m), weight 63.2 kg, SpO2 94 %.  HEENT-  Normocephalic, no lesions, without obvious abnormality.  Normal external eye and conjunctiva.  Normal TM's bilaterally.  Normal auditory canals and external ears. Normal external nose, mucus membranes and septum.  Normal pharynx. Cardiovascular- S1, S2 normal, pulses palpable throughout   Lungs- chest clear, no wheezing, rales, normal symmetric air entry Abdomen- soft, non-tender; bowel sounds normal; no masses,  no  organomegaly Extremities- no edema Lymph-no adenopathy palpable Musculoskeletal-Left AKA, right toe amputations Skin-warm and dry, no hyperpigmentation, vitiligo, or suspicious lesions  Neurological Examination  Mental Status: Patient does not respond to verbal stimuli.  Does not respond to deep sternal rub.  Does not follow commands.  No verbalizations noted.  Cranial Nerves: II: patient does not respond confrontation bilaterally, pupils right 2 mm, left 2 mm,and unreactive bilaterally III,IV,VI: Oculocephalic response absent bilaterally.  V,VII: corneal reflex absent bilaterally  VIII: patient does not respond to verbal stimuli IX,X: gag reflex reduced, XI: trapezius strength unable to test bilaterally XII: tongue strength unable to test Motor: Extremities flaccid throughout.  No spontaneous movement noted.  No purposeful movements noted. Sensory: Does not respond to noxious stimuli in any extremity. Deep Tendon Reflexes:  Absent throughout. Plantars: Unable to assess due to amputations Cerebellar: Unable to perform  Laboratory Studies:   Basic Metabolic Panel: Recent Labs  Lab 11/23/2019 1459 12/08/2019 1629 11/27/2019 1929 12/07/19 0651 12/07/19 0651 12/07/19 1117 12/07/19 1117 12/07/19 1700 12/08/19 0340 12/09/19 0443  NA 136  142  --    < > 141  --  143  --  145 144 145  K 4.2  2.6*  --    < > 3.6  --  3.9  --  4.5 5.1 4.6  CL 100  114*  --    < > 111  --  114*  --  114* 110 110  CO2 19*  17*  --    < > 22  --  23  --  _0 GLUCOSE 807*  625*  --    < > 332*  --  124*  --  128* 189* 167*  BUN 21  17  --    < > 28*  --  28*  --  31* 35* 53*  CREATININE 1.55*  1.11  --    < > 2.00*  --  2.11*  --  2.04* 2.55* 3.33*  CALCIUM 8.4*  6.0*  --    < > 7.6*   < > 7.6*   < > 7.5* 7.4* 7.6*  MG  --  1.8  --   --   --  2.0  --   --  1.8 2.3  PHOS 7.5*  --   --   --   --  5.3*  --   --  5.1* 6.1*   < > = values in this interval not displayed.    Liver Function  Tests: Recent Labs  Lab 12/19/2019 1459  AST 28  15  ALT 13  10  ALKPHOS 27*  21*  BILITOT 1.0  0.9  PROT 5.8*  4.4*  ALBUMIN 2.6*  2.0*   No results for input(s): LIPASE, AMYLASE in the last 168 hours. Recent Labs  Lab 12/10/2019 1309  AMMONIA 29    CBC: Recent Labs  Lab 12/18/2019 1459 12/07/19 0651 12/08/19 0340 12/09/19 0443  WBC 4.3 4.3 10.4 9.3  NEUTROABS 3.6  --   --  8.5*  HGB 8.8* 10.9* 9.9* 9.2*  HCT 28.7* 36.4* 30.4* 27.7*  MCV 90.8 91.2 87.1 84.5  PLT 169 157 118* 99*    Cardiac Enzymes: Recent Labs  Lab 12/09/19 1031  CKTOTAL 845*    BNP: Invalid input(s): POCBNP  CBG: Recent Labs  Lab 12/08/19 2035 12/09/19 0024 12/09/19 0450 12/09/19 0722 12/09/19 1124  GLUCAP 150* 139* 155* 111* 139*    Microbiology: Results for orders placed or performed during the hospital encounter of 11/25/2019  Culture, blood (routine x 2)     Status: None (Preliminary result)   Collection Time: 12/09/2019  1:01 PM   Specimen: BLOOD  Result Value Ref Range Status   Specimen Description BLOOD BLOOD RIGHT FOREARM  Final   Special Requests   Final    BOTTLES DRAWN AEROBIC AND ANAEROBIC Blood Culture adequate volume   Culture   Final    NO GROWTH 3 DAYS Performed at [facility], [address], [place], [place] [address]    Report Status PENDING  Incomplete  Respiratory Panel by RT PCR (Flu A&B, Covid) - Nasopharyngeal Swab     Status: None   Collection Time: 12/08/2019  2:35 PM   Specimen: Nasopharyngeal Swab  Result Value Ref Range Status   SARS Coronavirus 2 by RT PCR NEGATIVE NEGATIVE Final  Comment: (NOTE) SARS-CoV-2 target nucleic acids are NOT DETECTED. The SARS-CoV-2 RNA is generally detectable in upper respiratoy specimens during the acute phase of infection. The lowest concentration of SARS-CoV-2 viral copies this assay can detect is 131 copies/mL. A negative result does not preclude SARS-Cov-2 infection and should not be used as the sole  basis for treatment or other patient management decisions. A negative result may occur with  improper specimen collection/handling, submission of specimen other than nasopharyngeal swab, presence of viral mutation(s) within the areas targeted by this assay, and inadequate number of viral copies (<131 copies/mL). A negative result must be combined with clinical observations, patient history, and epidemiological information. The expected result is Negative. Fact Sheet for Patients:  PinkCheek.be Fact Sheet for Healthcare Providers:  GravelBags.it This test is not yet ap proved or cleared by the [place] FDA and  has been authorized for detection and/or diagnosis of SARS-CoV-2 by FDA under an Emergency Use Authorization (EUA). This EUA will remain  in effect (meaning this test can be used) for the duration of the COVID-19 declaration under Section 564(b)(1) of the Act, 21 U.S.C. section 360bbb-3(b)(1), unless the authorization is terminated or revoked sooner.    Influenza A by PCR NEGATIVE NEGATIVE Final   Influenza B by PCR NEGATIVE NEGATIVE Final    Comment: (NOTE) The Xpert Xpress SARS-CoV-2/FLU/RSV assay is intended as an aid in  the diagnosis of influenza from Nasopharyngeal swab specimens and  should not be used as a sole basis for treatment. Nasal washings and  aspirates are unacceptable for Xpert Xpress SARS-CoV-2/FLU/RSV  testing. Fact Sheet for Patients: PinkCheek.be Fact Sheet for Healthcare Providers: GravelBags.it This test is not yet approved or cleared by the [place] FDA and  has been authorized for detection and/or diagnosis of SARS-CoV-2 by  FDA under an Emergency Use Authorization (EUA). This EUA will remain  in effect (meaning this test can be used) for the duration of the  Covid-19 declaration under Section 564(b)(1) of the Act, 21  U.S.C.  section 360bbb-3(b)(1), unless the authorization is  terminated or revoked. Performed at [facility], [address] [place], [place] [address]   MRSA PCR Screening     Status: Abnormal   Collection Time: 12/07/19 11:17 AM   Specimen: Nasopharyngeal  Result Value Ref Range Status   MRSA by PCR POSITIVE (A) NEGATIVE Final    Comment:        The GeneXpert MRSA Assay (FDA approved for NASAL specimens only), is one component of a comprehensive MRSA colonization surveillance program. It is not intended to diagnose MRSA infection nor to guide or monitor treatment for MRSA infections. RESULT CALLED TO, READ BACK BY AND VERIFIED WITH: Netta Neat RN AT 1696 ON 12/07/19 SNG Performed at [facility], [address] [place], [place] [address]   Culture, respiratory (non-expectorated)     Status: None (Preliminary result)   Collection Time: 12/08/19  4:44 AM   Specimen: Tracheal Aspirate; Respiratory  Result Value Ref Range Status   Specimen Description   Final    TRACHEAL ASPIRATE Performed at [facility], [address] [place], [place] [address]    Special Requests   Final    NONE Performed at [facility], [place], [place] [address]    Gram Stain FEW WBC PRESENT, PREDOMINANTLY PMN FEW YEAST   Final   Culture   Final    FEW YEAST CULTURE REINCUBATED FOR BETTER GROWTH Performed at [facility]  Lab, [address], [place], [place] [address]    Report Status PENDING  Incomplete    Coagulation Studies: No results for input(s): LABPROT, INR in the last 72 hours.  Urinalysis:  Recent Labs  Lab 11/30/2019 1459  COLORURINE STRAW*  LABSPEC 1.024  PHURINE 6.0  GLUCOSEU >=500*  HGBUR MODERATE*  BILIRUBINUR NEGATIVE  KETONESUR 5*  PROTEINUR NEGATIVE  NITRITE NEGATIVE  LEUKOCYTESUR NEGATIVE    Lipid Panel:     Component Value Date/Time   CHOL 186 08/10/2015 0540   TRIG 67 12/08/2019 2020   HDL 25 (L)  08/10/2015 0540   CHOLHDL 7.4 08/10/2015 0540   VLDL 29 08/10/2015 0540   LDLCALC 132 (H) 08/10/2015 0540    HgbA1C:  Lab Results  Component Value Date   HGBA1C 14.9 (H) 10/30/2019    Urine Drug Screen:      Component Value Date/Time   LABOPIA NONE DETECTED 12/03/2019 1459   COCAINSCRNUR POSITIVE (A) 12/04/2019 1459   LABBENZ POSITIVE (A) 12/03/2019 1459   AMPHETMU NONE DETECTED 12/20/2019 1459   THCU NONE DETECTED 11/23/2019 1459   LABBARB NONE DETECTED 12/05/2019 1459    Alcohol Level: No results for input(s): ETH in the last 168 hours.  Other results: EKG: sinus tachycardia at 111 bpm.  Imaging: EEG  Result Date: 12/08/2019 [name], MD     12/08/2019  6:38 PM ELECTROENCEPHALOGRAM REPORT Patient: Kandis Ban       Room #: IC05A-AA EEG No. ID: [id number] Age: 64 y.o.        Sex: male Requesting Physician: Kasa Report Date:  12/08/2019       Interpreting Physician: [name] History: Yoshito Gaza is an 64 y.o. male with polysubstance [name] and septic shock Medications: Fentanyl, Versed, Pitressin, Amiodarone, Insulin, Solucortef, Pepcid, Unasyn, Amiodarone Conditions of Recording:  This is a 21 channel routine scalp EEG performed with bipolar and monopolar montages arranged in accordance to the international 10/20 system of electrode placement. One channel was dedicated to EKG recording. The patient is in the intubated and sedated state. Description:  The background activity is slow and poorly organized.  It consists of a low voltage polymorphic delta activity that is continuous and diffusely distributed.  Also noted are occasional intermittent periodic discharges of triphasic morphology.  Hyperventilation and intermittent photic stimulation were not performed.  IMPRESSION: This is an abnormal electroencephalogram due to general background slowing and intermittent triphasic waves.  These are seen most commonly in encephalopathic states.  Although most often seen with hepatic  encephalopathies, it is not isolated to this clinical scenario. [name], MD Neurology [phone] 12/08/2019, 6:17 PM   DG Chest Port 1 View  Result Date: 12/08/2019 CLINICAL DATA:  Acute respiratory failure. EXAM: PORTABLE CHEST 1 VIEW COMPARISON:  Multiple radiographs 12/01/2019 FINDINGS: Endotracheal tube tip 4.1 cm from the carina. Enteric tube in place with tip and side-port below the diaphragm. Right central line tip at the atrial caval junction. Progressive opacities throughout the right hemithorax. Question of developing cavitation in the right upper lobe. Additional patchy airspace disease in the left lung is not changed. Normal heart size with unchanged mediastinal contours. Aortic atherosclerosis. Suspected small right pleural effusion. No pneumothorax. IMPRESSION: 1. Progressive opacities throughout the right hemithorax. Question of developing cavitation in the right upper lobe. Additional patchy airspace disease in the left lung is not significantly changed. 2. Support apparatus unchanged. Electronically Signed   By: Keith Rake M.D.   On: 12/08/2019 02:01     Assessment/Plan: 63  year old male with a history of polysubstance [name] admitted withseptic shock, DKA, and severeacute hypoxic hypercapnic respiratory failure secondary to drug overdose(cocaine & Benzo's)and aspiration pneumonia. Now intubated and sedated.  Does not tolerate attempts to decrease sedation.  Due to sedation neurological examination is suboptimal.  Mental status likely toxic/metabolic in etiology but can not rule out an intracerebral process such as stroke with history of cocaine [name] and multiple poorly controlled vascular risk factors.  EEG reviewed and is only significant for slowing and triphasic waves, consistent with encephaloathy.  No epileptiform activity is noted.    Recommendations: 1. MRI pending   [name], MD Neurology [phone] 12/09/2019, 12:04 PM

## 2019-12-09 NOTE — Progress Notes (Signed)
New orders from NP [facility]. VT 500, RR 26, FIO2 35%, Peep 8. ABG to follow to assess changes.

## 2019-12-09 NOTE — Progress Notes (Signed)
Pharmacy Antibiotic Note  Richard [name] is a 64 y.o. male admitted on 12/02/2019 with cardiac arrest and respiratory failure secondary to suspected intentional overdose.cardiac arrest and respiratory failure secondary to suspected intentional overdose. Pharmacy has been consulted for Zosyn dosing for possible aspiration event. Had received 4 days of Unasyn prior to change in antibiotics.   Plan: Zosyn 3.375g Q8 hours.   Will continue to monitor renal function closely and adjust dosing as necessary.  Height: 6' (182.9 cm) Weight: 139 lb 5.3 oz (63.2 kg) IBW/kg (Calculated) : 77.6  Temp (24hrs), Avg:99 F (37.2 C), Min:98.4 F (36.9 C), Max:99.9 F (37.7 C)  Recent Labs  Lab 12/20/2019 1309 12/11/2019 1459 11/25/2019 1929 12/05/2019 2055 12/10/2019 2221 12/01/2019 2310 12/07/19 0651 12/07/19 1117 12/07/19 1700 12/08/19 0340 12/09/19 0443  WBC  --  4.3  --   --   --   --  4.3  --   --  10.4 9.3  CREATININE  --  1.55*  1.11   < >  --   --    < > 2.00* 2.11* 2.04* 2.55* 3.33*  LATICACIDVEN 3.5* 3.8*  --  4.1* 5.2*  --   --   --   --   --   --    < > = values in this interval not displayed.    Estimated Creatinine Clearance: 20 mL/min (A) (by C-G formula based on SCr of 3.33 mg/dL (H)).    No Known Allergies  Antimicrobials this admission: Zosyn 2/17 >> Cefepime, Metronidazole, Vancomycin 2/14 x 1.  Unasyn 2/14 >> 2/17  Dose adjustments this admission: N/A   Microbiology results: 2/14 BCx: NG x 3 days 2/14 Influenza A/B: negative  2/14 Coronavirus PCR: negative   Thank you for allowing pharmacy to be a part of this patient's care.  Richard [name] 12/09/2019 7:56 PM

## 2019-12-10 ENCOUNTER — Inpatient Hospital Stay: Payer: Medicare Other

## 2019-12-10 DIAGNOSIS — Z515 Encounter for palliative care: Secondary | ICD-10-CM

## 2019-12-10 DIAGNOSIS — Z66 Do not resuscitate: Secondary | ICD-10-CM

## 2019-12-10 DIAGNOSIS — J9690 Respiratory failure, unspecified, unspecified whether with hypoxia or hypercapnia: Secondary | ICD-10-CM

## 2019-12-10 DIAGNOSIS — G939 Disorder of brain, unspecified: Secondary | ICD-10-CM

## 2019-12-10 DIAGNOSIS — Z7189 Other specified counseling: Secondary | ICD-10-CM

## 2019-12-10 LAB — CBC WITH DIFFERENTIAL/PLATELET
Abs Immature Granulocytes: 0.21 10*3/uL — ABNORMAL HIGH (ref 0.00–0.07)
Basophils Absolute: 0.1 10*3/uL (ref 0.0–0.1)
Basophils Relative: 1 %
Eosinophils Absolute: 0 10*3/uL (ref 0.0–0.5)
Eosinophils Relative: 0 %
HCT: 27.8 % — ABNORMAL LOW (ref 39.0–52.0)
Hemoglobin: 9 g/dL — ABNORMAL LOW (ref 13.0–17.0)
Immature Granulocytes: 3 %
Lymphocytes Relative: 2 %
Lymphs Abs: 0.1 10*3/uL — ABNORMAL LOW (ref 0.7–4.0)
MCH: 27.4 pg (ref 26.0–34.0)
MCHC: 32.4 g/dL (ref 30.0–36.0)
MCV: 84.8 fL (ref 80.0–100.0)
Monocytes Absolute: 0.4 10*3/uL (ref 0.1–1.0)
Monocytes Relative: 5 %
Neutro Abs: 6.3 10*3/uL (ref 1.7–7.7)
Neutrophils Relative %: 89 %
Platelets: 73 10*3/uL — ABNORMAL LOW (ref 150–400)
RBC: 3.28 MIL/uL — ABNORMAL LOW (ref 4.22–5.81)
RDW: 14.8 % (ref 11.5–15.5)
Smear Review: NORMAL
WBC: 7 10*3/uL (ref 4.0–10.5)
nRBC: 11 % — ABNORMAL HIGH (ref 0.0–0.2)

## 2019-12-10 LAB — GLUCOSE, CAPILLARY
Glucose-Capillary: 256 mg/dL — ABNORMAL HIGH (ref 70–99)
Glucose-Capillary: 149 mg/dL — ABNORMAL HIGH (ref 70–99)
Glucose-Capillary: 82 mg/dL (ref 70–99)
Glucose-Capillary: 132 mg/dL — ABNORMAL HIGH (ref 70–99)
Glucose-Capillary: 143 mg/dL — ABNORMAL HIGH (ref 70–99)
Glucose-Capillary: 202 mg/dL — ABNORMAL HIGH (ref 70–99)

## 2019-12-10 LAB — BASIC METABOLIC PANEL
Anion gap: 11 (ref 5–15)
BUN: 72 mg/dL — ABNORMAL HIGH (ref 8–23)
CO2: 24 mmol/L (ref 22–32)
Calcium: 7.7 mg/dL — ABNORMAL LOW (ref 8.9–10.3)
Chloride: 110 mmol/L (ref 98–111)
Creatinine, Ser: 3.51 mg/dL — ABNORMAL HIGH (ref 0.61–1.24)
GFR calc Af Amer: 20 mL/min — ABNORMAL LOW (ref >60)
GFR calc non Af Amer: 17 mL/min — ABNORMAL LOW (ref >60)
Glucose, Bld: 239 mg/dL — ABNORMAL HIGH (ref 70–99)
Potassium: 4.1 mmol/L (ref 3.5–5.1)
Sodium: 145 mmol/L (ref 135–145)

## 2019-12-10 LAB — LEGIONELLA PNEUMOPHILA SEROGP 1 UR AG: L. pneumophila Serogp 1 Ur Ag: NEGATIVE

## 2019-12-10 LAB — CULTURE, RESPIRATORY W GRAM STAIN

## 2019-12-10 LAB — PROCALCITONIN: Procalcitonin: 52.55 ng/mL

## 2019-12-10 MED ORDER — PIPERACILLIN-TAZOBACTAM 3.375 G IVPB
3.3750 g | Freq: Two times a day (BID) | INTRAVENOUS | Status: DC
  Administered 2019-12-10: 21:00:00 3.375 g via INTRAVENOUS
  Filled 2019-12-10: qty 50

## 2019-12-10 NOTE — Progress Notes (Signed)
CRITICAL CARE NOTE [name] admitted withSeptic shock, DKA, and severeAcute Hypoxic Hypercapnic Respiratory Failure secondary to drug overdose(cocaine & Benzo's)and aspiration pneumonia. Was intubated in the ED. Now with AKI.  CULTURES: SARS-CoV-2 PCR 2/14>> negative Influenza PCR 2/14>>negative Blood culture 2/14>> Tracheal aspirate 2/15>> Strep pneumo urinary antigen 2/15>> Legionella urinary antigen 2/15>>  ANTIBIOTICS: Cefepime 2/14>>2/14 Flagyl 2/14>>2/14 Vancomycin 2/14>>2/14 Unasyn 2/14>>  SIGNIFICANT EVENTS: 2/14>> Presented to ED 2/14>> No ICU beds available at [facility], to be transferred to [facility] 2/15>>No ICU beds available at [facility], will be admitted to [facility] ICU as beds now available 2/17 SEVERE RESP FAILURE, CONCERNS FOR BRAIN INJURY EEG NOTED, MRI PENDING 2/17 I CALLED SISTER MULTIPLE  TIMES, NO ANSWER AND ALSO I WAS UNABLE TO LEAVE VM 2/18 severe respiratory failure severe encephalopathy severe brain damage based on MRI report with watershed infarcts  LINES/TUBES: ET Tube 2/14>> Right IJ CVC (placed by ED provider) 2/14>>  CC  follow up respiratory failure  SUBJECTIVE Patient remains critically ill Prognosis is guarded   BP 102/75   Pulse 74   Temp (!) 97.5 F (36.4 C) (Axillary)   Resp (!) 26   Ht 6' (1.829 m)   Wt 63 kg   SpO2 96%   BMI 18.84 kg/m    I/O last 3 completed shifts: In: 1941.4 [I.V.:1164.6; NG/GT:476.7; IV Piggyback:300] Out: 580 [Urine:580] No intake/output data recorded.  SpO2: 96 % O2 Flow Rate (L/min): 15 L/min FiO2 (%): 35 %   SIGNIFICANT EVENTS   REVIEW OF SYSTEMS  PATIENT IS UNABLE TO PROVIDE COMPLETE REVIEW OF SYSTEMS DUE TO SEVERE CRITICAL ILLNESS   PHYSICAL EXAMINATION:  GENERAL:critically ill appearing, +resp distress HEAD: Normocephalic, atraumatic.  EYES: Pupils equal, round, reactive to light.  No scleral icterus.  MOUTH: Moist mucosal membrane. NECK: Supple.  PULMONARY: +rhonchi,  +wheezing CARDIOVASCULAR: S1 and S2. Regular rate and rhythm. No murmurs, rubs, or gallops.  GASTROINTESTINAL: Soft, nontender, -distended.  Positive bowel sounds.   MUSCULOSKELETAL: No swelling, clubbing, or edema.  NEUROLOGIC: obtunded, GCS<8 SKIN:intact,warm,dry  MEDICATIONS: I have reviewed all medications and confirmed regimen as documented   CULTURE RESULTS   Recent Results (from the past 240 hour(s))  Culture, blood (routine x 2)     Status: None (Preliminary result)   Collection Time: 12/10/2019  1:01 PM   Specimen: BLOOD  Result Value Ref Range Status   Specimen Description BLOOD BLOOD RIGHT FOREARM  Final   Special Requests   Final    BOTTLES DRAWN AEROBIC AND ANAEROBIC Blood Culture adequate volume   Culture   Final    NO GROWTH 4 DAYS Performed at [facility], [address] [place], [place] [address]    Report Status PENDING  Incomplete  Respiratory Panel by RT PCR (Flu A&B, Covid) - Nasopharyngeal Swab     Status: None   Collection Time: 11/25/2019  2:35 PM   Specimen: Nasopharyngeal Swab  Result Value Ref Range Status   SARS Coronavirus 2 by RT PCR NEGATIVE NEGATIVE Final    Comment: (NOTE) SARS-CoV-2 target nucleic acids are NOT DETECTED. The SARS-CoV-2 RNA is generally detectable in upper respiratoy specimens during the acute phase of infection. The lowest concentration of SARS-CoV-2 viral copies this assay can detect is 131 copies/mL. A negative result does not preclude SARS-Cov-2 infection and should not be used as the sole basis for treatment or other patient management decisions. A negative result may occur with  improper specimen collection/handling, submission of specimen other than nasopharyngeal swab,  presence of viral mutation(s) within the areas targeted by this assay, and inadequate number of viral copies (<131 copies/mL). A negative result must be combined with clinical observations, patient history, and epidemiological information.  The expected result is Negative. Fact Sheet for Patients:  PinkCheek.be Fact Sheet for Healthcare Providers:  GravelBags.it This test is not yet ap proved or cleared by the [place] FDA and  has been authorized for detection and/or diagnosis of SARS-CoV-2 by FDA under an Emergency Use Authorization (EUA). This EUA will remain  in effect (meaning this test can be used) for the duration of the COVID-19 declaration under Section 564(b)(1) of the Act, 21 U.S.C. section 360bbb-3(b)(1), unless the authorization is terminated or revoked sooner.    Influenza A by PCR NEGATIVE NEGATIVE Final   Influenza B by PCR NEGATIVE NEGATIVE Final    Comment: (NOTE) The Xpert Xpress SARS-CoV-2/FLU/RSV assay is intended as an aid in  the diagnosis of influenza from Nasopharyngeal swab specimens and  should not be used as a sole basis for treatment. Nasal washings and  aspirates are unacceptable for Xpert Xpress SARS-CoV-2/FLU/RSV  testing. Fact Sheet for Patients: PinkCheek.be Fact Sheet for Healthcare Providers: GravelBags.it This test is not yet approved or cleared by the [place] FDA and  has been authorized for detection and/or diagnosis of SARS-CoV-2 by  FDA under an Emergency Use Authorization (EUA). This EUA will remain  in effect (meaning this test can be used) for the duration of the  Covid-19 declaration under Section 564(b)(1) of the Act, 21  U.S.C. section 360bbb-3(b)(1), unless the authorization is  terminated or revoked. Performed at [facility], [address] [place], [place] [address]   MRSA PCR Screening     Status: Abnormal   Collection Time: 12/07/19 11:17 AM   Specimen: Nasopharyngeal  Result Value Ref Range Status   MRSA by PCR POSITIVE (A) NEGATIVE Final    Comment:        The GeneXpert MRSA Assay (FDA approved for NASAL specimens only),  is one component of a comprehensive MRSA colonization surveillance program. It is not intended to diagnose MRSA infection nor to guide or monitor treatment for MRSA infections. RESULT CALLED TO, READ BACK BY AND VERIFIED WITH: Netta Neat RN AT [id number] ON 12/07/19 SNG Performed at [facility], [address] [place], [place] [address]   Culture, respiratory (non-expectorated)     Status: None (Preliminary result)   Collection Time: 12/08/19  4:44 AM   Specimen: Tracheal Aspirate; Respiratory  Result Value Ref Range Status   Specimen Description   Final    TRACHEAL ASPIRATE Performed at [facility], [address] [place], [place] [address]    Special Requests   Final    NONE Performed at [facility], [place], [place] [address]    Gram Stain FEW WBC PRESENT, PREDOMINANTLY PMN FEW YEAST   Final   Culture   Final    FEW YEAST CULTURE REINCUBATED FOR BETTER GROWTH Performed at [facility], [place] [address] [place], [place] [address]    Report Status PENDING  Incomplete          IMAGING    MR BRAIN WO CONTRAST  Result Date: 12/10/2019 CLINICAL DATA:  Overdose an aspiration. Unresponsive. Anoxic brain injury. EXAM: MRI HEAD WITHOUT CONTRAST TECHNIQUE: Multiplanar, multiecho pulse sequences of the brain and surrounding structures were obtained without intravenous contrast. COMPARISON:  Head CT 4 days ago. FINDINGS: Brain: Patient does not  show a pattern of widespread hypoxic ischemic brain injury. No abnormality is seen affecting the brainstem. Few old small vessel cerebellar infarctions. Left cerebral hemisphere shows a single punctate acute infarction in the left posteromedial parietal region. Right hemisphere shows scattered acute infarctions in a watershed distribution from front to back at the anterior cerebral middle cerebral junction. The largest single infarction is a 2.5 cm infarction at the right frontal region. No  large confluent infarction. No sign of swelling or hemorrhage. Elsewhere, there chronic small-vessel ischemic changes of the deep white matter. No evidence of mass, hemorrhage, hydrocephalus or extra-axial collection. Vascular: Major vessels at the base of the brain show flow. Skull and upper cervical spine: Negative Sinuses/Orbits: Clear/normal Other: None IMPRESSION: No evidence of widespread hypoxic ischemic injury. 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 No hemorrhage or mass effect. Single punctate infarction of the left posterior parietal brain. Electronically Signed   By: Nelson Chimes M.D.   On: 12/10/2019 03:30   DG Chest Port 1 View  Result Date: 12/10/2019 CLINICAL DATA:  Acute respiratory failure. EXAM: PORTABLE CHEST 1 VIEW COMPARISON:  12/08/2019 FINDINGS: Endotracheal tube, enteric tube, and right internal jugular central line are unchanged in position. Patchy opacities throughout the right hemithorax with slight improvement. Questionable cavitary lesion in the right upper lobe again seen. Lucency paralleling the right chest wall favored to represent a skin fold with pulmonary markings extending beyond to the pleural surface. Patchy opacity in the left upper lobe appears similar. Unchanged heart size and mediastinal contours with aortic atherosclerosis. IMPRESSION: 1. Slight improvement in patchy opacities throughout the right hemithorax. Questionable cavitary lesion in the right upper lobe again seen. Left upper lobe opacities are unchanged. 2. Lucency paralleling the right chest wall favored to represent a skin fold. Electronically Signed   By: Keith Rake M.D.   On: 12/10/2019 00:46       Indwelling Urinary Catheter continued, requirement due to   Reason to continue Indwelling Urinary Catheter strict Intake/Output monitoring for hemodynamic instability   Central Line/ continued, requirement due to  Reason to continue  [place] of central venous pressure or other hemodynamic parameters and poor IV access   Ventilator continued, requirement due to severe respiratory failure   Ventilator Sedation RASS 0 to -2      ASSESSMENT AND PLAN SYNOPSIS  Severe ACUTE Hypoxic and Hypercapnic Respiratory Failure From acute drig ABUSE AND cocaine abuse with aspiration pneumonia Multiorgan failure with severe acute encephalopathy from watershed infarcts with severe brain damage  Severe ACUTE Hypoxic and Hypercapnic Respiratory Failure -continue Full MV support -continue Bronchodilator Therapy -Wean Fio2 and PEEP as tolerated -Unable to wean due to severe multiorgan failure and brain damage   ACUTE KIDNEY INJURY/Renal Failure -follow chem 7 -follow UO -continue Foley Catheter-assess need -Avoid nephrotoxic agents -Recheck creatinine    NEUROLOGY - intubated and sedated - minimal sedation to achieve a RASS goal: -1 I report reviewed follow-up neurology recommendation Prognosis is poor    CARDIAC ICU monitoring  ID -continue IV abx as prescibed -follow up cultures  GI GI PROPHYLAXIS as indicated  NUTRITIONAL STATUS DIET-->TF's as tolerated Constipation protocol as indicated  ENDO - will use ICU hypoglycemic\Hyperglycemia protocol if indicated   ELECTROLYTES -follow labs as needed -replace as needed -pharmacy consultation and following   DVT/GI PRX ordered TRANSFUSIONS AS NEEDED MONITOR FSBS ASSESS the need for LABS as needed   Critical Care Time devoted to patient care services  described in this note is 45 minutes.   Overall, patient is critically ill, prognosis is guarded.  Patient with Multiorgan failure and at high risk for cardiac arrest and death.   Very poor chance of meaningful recovery Patient with multiorgan failure due to cocaine abuse and watershed infarcts And severe brain damage  Recommend DNR/DNI status as well as comfort care Only to update the  family as soon as possible   Corrin Parker, M.D.  [facility]  Medical Director [place] Director [facility] Cardio-Pulmonary Department

## 2019-12-10 NOTE — Progress Notes (Addendum)
Subjective: Patient remains intubated and sedated.  Objective: Current vital signs: BP 108/68   Pulse (!) 133   Temp 97.7 F (36.5 C) (Oral)   Resp (!) 24   Ht 6' (1.829 m)   Wt 63 kg   SpO2 95%   BMI 18.84 kg/m  Vital signs in last 24 hours: Temp:  [97.5 F (36.4 C)-99.3 F (37.4 C)] 97.7 F (36.5 C) (02/18 0800) Pulse Rate:  [50-133] 133 (02/18 1200) Resp:  [14-26] 24 (02/18 1200) BP: (79-121)/(54-85) 108/68 (02/18 1100) SpO2:  [92 %-98 %] 95 % (02/18 1200) FiO2 (%):  [35 %] 35 % (02/18 1148) Weight:  [82 kg] 63 kg (02/18 0341)  Intake/Output from previous day: 02/17 0701 - 02/18 0700 In: 854.3 [I.V.:654.3; IV Piggyback:200] Out: 470 [Urine:470] Intake/Output this shift: Total I/O In: 319.4 [I.V.:219.4; IV Piggyback:100] Out: 275 [Urine:275] Nutritional status:  Diet Order            Diet NPO time specified  Diet effective now              Neurologic Exam: Mental Status: Patient does not respond to verbal stimuli.  Does not respond to deep sternal rub.  Does not follow commands.  No verbalizations noted.  Cranial Nerves: II: patient does not respond confrontation bilaterally, pupils right 2 mm, left 2 mm,and poorly reactive bilaterally III,IV,VI: Oculocephalic response absent bilaterally.  V,VII: corneal reflex weak bilaterally  VIII: patient does not respond to verbal stimuli IX,X: gag reflex reduced, XI: trapezius strength unable to test bilaterally XII: tongue strength unable to test Motor: Extremities flaccid throughout.  No spontaneous movement noted.  No purposeful movements noted. Sensory: Does not respond to noxious stimuli in any extremity.   Lab Results: Basic Metabolic Panel: Recent Labs  Lab 12/12/2019 1459 12/15/2019 1629 12/10/2019 1929 12/07/19 1117 12/07/19 1117 12/07/19 1700 12/07/19 1700 12/08/19 0340 12/09/19 0443 12/10/19 0354  NA 136  142  --    < > 143  --  145  --  144 145 145  K 4.2  2.6*  --    < > 3.9  --  4.5  --   5.1 4.6 4.1  CL 100  114*  --    < > 114*  --  114*  --  110 110 110  CO2 19*  17*  --    < > 23  --  22  --  _0 GLUCOSE 807*  625*  --    < > 124*  --  128*  --  189* 167* 239*  BUN 21  17  --    < > 28*  --  31*  --  35* 53* 72*  CREATININE 1.55*  1.11  --    < > 2.11*  --  2.04*  --  2.55* 3.33* 3.51*  CALCIUM 8.4*  6.0*  --    < > 7.6*   < > 7.5*   < > 7.4* 7.6* 7.7*  MG  --  1.8  --  2.0  --   --   --  1.8 2.3  --   PHOS 7.5*  --   --  5.3*  --   --   --  5.1* 6.1*  --    < > = values in this interval not displayed.    Liver Function Tests: Recent Labs  Lab 12/08/2019 1459  AST 28  15  ALT 13  10  ALKPHOS 27*  21*  BILITOT  1.0  0.9  PROT 5.8*  4.4*  ALBUMIN 2.6*  2.0*   No results for input(s): LIPASE, AMYLASE in the last 168 hours. Recent Labs  Lab 11/26/2019 1309  AMMONIA 29    CBC: Recent Labs  Lab 11/26/2019 1459 12/07/19 0651 12/08/19 0340 12/09/19 0443 12/10/19 0354  WBC 4.3 4.3 10.4 9.3 7.0  NEUTROABS 3.6  --   --  8.5* 6.3  HGB 8.8* 10.9* 9.9* 9.2* 9.0*  HCT 28.7* 36.4* 30.4* 27.7* 27.8*  MCV 90.8 91.2 87.1 84.5 84.8  PLT 169 157 118* 99* 73*    Cardiac Enzymes: Recent Labs  Lab 12/09/19 1031  CKTOTAL 845*    Lipid Panel: Recent Labs  Lab 12/08/19 2020  TRIG 67    CBG: Recent Labs  Lab 12/09/19 1927 12/09/19 2326 12/10/19 0332 12/10/19 0748 12/10/19 1126  GLUCAP 128* 294* 256* 149* 37    Microbiology: Results for orders placed or performed during the hospital encounter of 12/05/2019  Culture, blood (routine x 2)     Status: None (Preliminary result)   Collection Time: 12/12/2019  1:01 PM   Specimen: BLOOD  Result Value Ref Range Status   Specimen Description BLOOD BLOOD RIGHT FOREARM  Final   Special Requests   Final    BOTTLES DRAWN AEROBIC AND ANAEROBIC Blood Culture adequate volume   Culture   Final    NO GROWTH 4 DAYS Performed at [facility], [address] [place], [place] [address]    Report  Status PENDING  Incomplete  Respiratory Panel by RT PCR (Flu A&B, Covid) - Nasopharyngeal Swab     Status: None   Collection Time: 12/10/2019  2:35 PM   Specimen: Nasopharyngeal Swab  Result Value Ref Range Status   SARS Coronavirus 2 by RT PCR NEGATIVE NEGATIVE Final    Comment: (NOTE) SARS-CoV-2 target nucleic acids are NOT DETECTED. The SARS-CoV-2 RNA is generally detectable in upper respiratoy specimens during the acute phase of infection. The lowest concentration of SARS-CoV-2 viral copies this assay can detect is 131 copies/mL. A negative result does not preclude SARS-Cov-2 infection and should not be used as the sole basis for treatment or other patient management decisions. A negative result may occur with  improper specimen collection/handling, submission of specimen other than nasopharyngeal swab, presence of viral mutation(s) within the areas targeted by this assay, and inadequate number of viral copies (<131 copies/mL). A negative result must be combined with clinical observations, patient history, and epidemiological information. The expected result is Negative. Fact Sheet for Patients:  PinkCheek.be Fact Sheet for Healthcare Providers:  GravelBags.it This test is not yet ap proved or cleared by the [place] FDA and  has been authorized for detection and/or diagnosis of SARS-CoV-2 by FDA under an Emergency Use Authorization (EUA). This EUA will remain  in effect (meaning this test can be used) for the duration of the COVID-19 declaration under Section 564(b)(1) of the Act, 21 U.S.C. section 360bbb-3(b)(1), unless the authorization is terminated or revoked sooner.    Influenza A by PCR NEGATIVE NEGATIVE Final   Influenza B by PCR NEGATIVE NEGATIVE Final    Comment: (NOTE) The Xpert Xpress SARS-CoV-2/FLU/RSV assay is intended as an aid in  the diagnosis of influenza from Nasopharyngeal swab specimens and   should not be used as a sole basis for treatment. Nasal washings and  aspirates are unacceptable for Xpert Xpress SARS-CoV-2/FLU/RSV  testing. Fact Sheet for Patients: PinkCheek.be Fact Sheet for Healthcare Providers: GravelBags.it This test is  not yet approved or cleared by the [place] and  has been authorized for detection and/or diagnosis of SARS-CoV-2 by  FDA under an Emergency Use Authorization (EUA). This EUA will remain  in effect (meaning this test can be used) for the duration of the  Covid-19 declaration under Section 564(b)(1) of the Act, 21  U.S.C. section 360bbb-3(b)(1), unless the authorization is  terminated or revoked. Performed at [facility], [address] [place], [place] [address]   MRSA PCR Screening     Status: Abnormal   Collection Time: 12/07/19 11:17 AM   Specimen: Nasopharyngeal  Result Value Ref Range Status   MRSA by PCR POSITIVE (A) NEGATIVE Final    Comment:        The GeneXpert MRSA Assay (FDA approved for NASAL specimens only), is one component of a comprehensive MRSA colonization surveillance program. It is not intended to diagnose MRSA infection nor to guide or monitor treatment for MRSA infections. RESULT CALLED TO, READ BACK BY AND VERIFIED WITH: Netta Neat RN AT 6063 ON 12/07/19 SNG Performed at [facility], [address] [place], [place] [address]   Culture, respiratory (non-expectorated)     Status: None   Collection Time: 12/08/19  4:44 AM   Specimen: Tracheal Aspirate; Respiratory  Result Value Ref Range Status   Specimen Description   Final    TRACHEAL ASPIRATE Performed at [facility], [address] [place], [place] [address]    Special Requests   Final    NONE Performed at [facility], [address] [place], [place] [address]    Gram Stain   Final    FEW WBC PRESENT, PREDOMINANTLY PMN FEW  YEAST Performed at [facility], [place] [address] [place], [place] [address]    Culture FEW CANDIDA ALBICANS  Final   Report Status 12/10/2019 FINAL  Final    Coagulation Studies: No results for input(s): LABPROT, INR in the last 72 hours.  Imaging: EEG  Result Date: 12/08/2019 [name], MD     12/08/2019  6:38 PM ELECTROENCEPHALOGRAM REPORT Patient: Richard [name]       Room #: IC05A-AA EEG No. ID: [id number] Age: 64 y.o.        Sex: male Requesting Physician: Kasa Report Date:  12/08/2019       Interpreting Physician: [name] History: Richard [name] is an 64 y.o. male with polysubstance abuse and septic shock Medications: Fentanyl, Versed, Pitressin, Amiodarone, Insulin, Solucortef, Pepcid, Unasyn, Amiodarone Conditions of Recording:  This is a 21 channel routine scalp EEG performed with bipolar and monopolar montages arranged in accordance to the international 10/20 system of electrode placement. One channel was dedicated to EKG recording. The patient is in the intubated and sedated state. Description:  The background activity is slow and poorly organized.  It consists of a low voltage polymorphic delta activity that is continuous and diffusely distributed.  Also noted are occasional intermittent periodic discharges of triphasic morphology.  Hyperventilation and intermittent photic stimulation were not performed.  IMPRESSION: This is an abnormal electroencephalogram due to general background slowing and intermittent triphasic waves.  These are seen most commonly in encephalopathic states.  Although most often seen with hepatic encephalopathies, it is not isolated to this clinical scenario. [name], MD Neurology [phone] 12/08/2019, 6:17 PM   MR BRAIN WO CONTRAST  Result Date: 12/10/2019 CLINICAL DATA:  Overdose an aspiration. Unresponsive. Anoxic brain injury. EXAM: MRI HEAD WITHOUT CONTRAST TECHNIQUE: Multiplanar, multiecho pulse sequences of  the brain and surrounding  structures were obtained without intravenous contrast. COMPARISON:  Head CT 4 days ago. FINDINGS: Brain: Patient does not show a pattern of widespread hypoxic ischemic brain injury. No abnormality is seen affecting the brainstem. Few old small vessel cerebellar infarctions. Left cerebral hemisphere shows a single punctate acute infarction in the left posteromedial parietal region. Right hemisphere shows scattered acute infarctions in a watershed distribution from front to back at the anterior cerebral middle cerebral junction. The largest single infarction is a 2.5 cm infarction at the right frontal region. No large confluent infarction. No sign of swelling or hemorrhage. Elsewhere, there chronic small-vessel ischemic changes of the deep white matter. No evidence of mass, hemorrhage, hydrocephalus or extra-axial collection. Vascular: Major vessels at the base of the brain show flow. Skull and upper cervical spine: Negative Sinuses/Orbits: Clear/normal Other: None IMPRESSION: No evidence of widespread hypoxic ischemic injury. 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 No hemorrhage or mass effect. Single punctate infarction of the left posterior parietal brain. Electronically Signed   By: Nelson Chimes M.D.   On: 12/10/2019 03:30   DG Chest Port 1 View  Result Date: 12/10/2019 CLINICAL DATA:  Acute respiratory failure. EXAM: PORTABLE CHEST 1 VIEW COMPARISON:  12/08/2019 FINDINGS: Endotracheal tube, enteric tube, and right internal jugular central line are unchanged in position. Patchy opacities throughout the right hemithorax with slight improvement. Questionable cavitary lesion in the right upper lobe again seen. Lucency paralleling the right chest wall favored to represent a skin fold with pulmonary markings extending beyond to the pleural surface. Patchy opacity in the left upper lobe appears similar. Unchanged heart size and mediastinal contours with  aortic atherosclerosis. IMPRESSION: 1. Slight improvement in patchy opacities throughout the right hemithorax. Questionable cavitary lesion in the right upper lobe again seen. Left upper lobe opacities are unchanged. 2. Lucency paralleling the right chest wall favored to represent a skin fold. Electronically Signed   By: Keith Rake M.D.   On: 12/10/2019 00:46    Medications:  I have reviewed the patient's current medications. Scheduled: . amiodarone  150 mg Intravenous Once  . chlorhexidine gluconate (MEDLINE KIT)  15 mL Mouth Rinse BID  . Chlorhexidine Gluconate Cloth  6 each Topical Daily  . feeding supplement (PRO-STAT SUGAR FREE 64)  30 mL Per Tube TID  . insulin aspart  0-9 Units Subcutaneous Q4H  . insulin glargine  20 Units Subcutaneous Daily  . mouth rinse  15 mL Mouth Rinse 10 times per day  . mupirocin ointment  1 application Nasal BID  . vecuronium  10 mg Intravenous Once    Assessment/Plan: 64 year old male with a history of polysubstance abuse admitted withseptic shock, DKA, and severeacute hypoxic hypercapnic respiratory failure secondary to drug overdose(cocaine & Benzo's)and aspiration pneumonia. Now intubated and sedated.  Due to sedation neurological examination is suboptimal.  Renal functions worsening.  EEG reviewed and is only significant for slowing and triphasic waves, consistent with encephaloathy.  No epileptiform activity is noted.  MRI of the brain personally reviewed and shows small watershed infarctions in the right cerebral hemisphere.  There is also a punctate infarction in the left posterior parietal lobe.  Suspect embolic etiology although hypoperfusion related to presentation a possibility as well.  Echocardiogram shows EF of 40-45% with no evidence of thrombus.    Recommendations: 1. Agree with continued addressing of medical issues 2. A1c, fasting lipid panel 3. Carotid dopplers to be performed once  patient extubated 4. Telemetry 5. Frequent  neuro checks    LOS: 3 days   [name], MD Neurology [phone] 12/10/2019  12:25 PM

## 2019-12-10 NOTE — Consult Note (Signed)
Consultation Note Date: 12/10/2019   Patient Name: Richard [name]  DOB: 1956-07-26  MRN: [id number]  Age / Sex: 64 y.o., male  PCP: Patient, No Pcp Per Referring Physician: Flora Lipps, MD  Reason for Consultation: Establishing goals of care  HPI/Patient Profile: 64 y.o. male  with past medical history of CHF, a fib, CAD, DM, HTN, pancreatic cancer, L AKA, and substance abuse admitted on 11/29/2019 with septic shock, DKA, and severe respiratory failure. Patient with drug overdose and aspiration pna. He was intubated in ED. Now has AKI with creatinine up to 3.51. MRI revealed watershed infarcts. Patient is unable to wean from ventilator d/t severe multiorgan failure and brain damage. PMT consulted for [place].   Clinical Assessment and Goals of Care: I have reviewed medical records including EPIC notes, labs and imaging, received report from RN and Dr. Mortimer Fries, assessed the patient and then spoke with multiple family members to discuss diagnosis prognosis, [place], EOL wishes, disposition and options.  During chart review I found that PMT saw patient multiple times in May 2020 - during that time patient completed advance directives - MOST: selected DNR and limited medical interventions (avoid ICU). He also completed HCPOA documentation and named his significant other [name] Lewis as HCPOA. These documents can be found in EPIC under ACP documents or chart review media tab.   I first contact patient's significant other, [name], who is HCPOA.   I introduced Palliative Medicine as specialized medical care for people living with serious illness. It focuses on providing relief from the symptoms and stress of a serious illness. The goal is to improve quality of life for both the patient and the family.   We discussed Mr. Rimel' current illness and what it means in the larger context of his on-going co-morbidities.  Natural disease trajectory and expectations at EOL were  discussed. Discussed multiorgan failure and dependence on mechanical ventilator. Also reviewed with [name] conversations with Mr. Shock in May - reviewed his wishes for DNR and limited medical interventions. [name] confirms this is what he would want. She agrees to code status change to DNR. We also discussed poor prognosis and concern that Mr. Bloodgood will not survive this hospitalization and it may be most appropriate to transition to focus on his comfort and free him from mechanical ventilation. She agrees but does ask that I call his sisters so that they can discuss coming to see Mr. Pauley.   Next I called patient's sisters listed in chart: Butch Penny did not answer. Spoke with sister Deneen and reviewed the above. She also agrees that we should honor Mr. Sweitzer previously stated wishes and free him from mechanical ventilation. She does want to come see Mr. Buice and would like his mother to see him as well. She plans to discuss with her sister Butch Penny and patient's mother tonight and figure out plan to visit. I reviewed visitation policy of 4 visitors at end of life d/t COVID restrictions - discussed that [name] and daughter, Whitman Hero would be 2 of the visitors. Sister also agreed to code status change to DNR.   Questions and concerns were addressed. The family was encouraged to call with questions or concerns.   Primary Decision Maker HCPOA - sig other Wynn Banker - please refer to ACP documents in EPIC    SUMMARY OF RECOMMENDATIONS   - code status changed to DNR - continue current care (pressors, mechanical ventilation) for now as family would like to come visit/be present for one way extubation - PMT to  follow up with family tomorrow about visitation and coordinating one way extubation - MOST listed in chart where patient had previously selected DNR status, avoid ICU care  Code Status/Advance Care Planning:  DNR   Symptom Management:   Per critical care - on fentanyl drip  Additional  Recommendations (Limitations, Scope, Preferences):  No Tracheostomy  Prognosis:   Poor prognosis - anticipate death with one way extubation  Discharge Planning: Anticipated Hospital Death      Primary Diagnoses: Present on Admission: . Sepsis ([place])   I have reviewed the medical record, interviewed the patient and family, and examined the patient. The following aspects are pertinent.  Past Medical History:  Diagnosis Date  . Atrial fibrillation ([place])    per patient  . Cancer ([place])   . CHF (congestive heart failure) ([place])   . Chronic back pain   . Chronic leg pain   . Coronary artery disease   . Diabetes mellitus without complication ([place])   . Hypertension   . Neuropathy    Social History   Socioeconomic History  . Marital status: Single    Spouse name: Not on file  . Number of children: Not on file  . Years of education: Not on file  . Highest education level: Not on file  Occupational History  . Not on file  Tobacco Use  . Smoking status: Current Every Day Smoker    Packs/day: 0.50    Years: 50.00    Pack years: 25.00  . Smokeless tobacco: Never Used  Substance and Sexual Activity  . Alcohol use: No    Alcohol/week: 0.0 standard drinks  . Drug use: Yes    Types: Cocaine, Benzodiazepines    Comment: Has used in the past-heroin. Cocaine used over a year ago per pt.  . Sexual activity: Not Currently  Other Topics Concern  . Not on file  Social History Narrative  . Not on file   Social Determinants of Health   Financial Resource Strain:   . Difficulty of Paying Living Expenses: Not on file  Food Insecurity:   . Worried About Charity fundraiser in the Last Year: Not on file  . Ran Out of Food in the Last Year: Not on file  Transportation Needs:   . Lack of Transportation (Medical): Not on file  . Lack of Transportation (Non-Medical): Not on file  Physical Activity:   . Days of Exercise per Week: Not on file  . Minutes of Exercise per Session: Not on  file  Stress:   . Feeling of Stress : Not on file  Social Connections:   . Frequency of Communication with Friends and Family: Not on file  . Frequency of Social Gatherings with Friends and Family: Not on file  . Attends Religious Services: Not on file  . Active Member of Clubs or Organizations: Not on file  . Attends Archivist Meetings: Not on file  . Marital Status: Not on file   Family History  Problem Relation Age of Onset  . CAD Mother   . Diabetes Mellitus II Mother   . Diabetes Mellitus II Father    Scheduled Meds: . amiodarone  150 mg Intravenous Once  . chlorhexidine gluconate (MEDLINE KIT)  15 mL Mouth Rinse BID  . Chlorhexidine Gluconate Cloth  6 each Topical Daily  . feeding supplement (PRO-STAT SUGAR FREE 64)  30 mL Per Tube TID  . insulin aspart  0-9 Units Subcutaneous Q4H  . insulin glargine  20 Units  Subcutaneous Daily  . mouth rinse  15 mL Mouth Rinse 10 times per day  . mupirocin ointment  1 application Nasal BID  . vecuronium  10 mg Intravenous Once   Continuous Infusions: . sodium chloride Stopped (12/07/19 0110)  . amiodarone 30 mg/hr (12/10/19 1124)  . dextrose 5% lactated ringers Stopped (12/07/19 1726)  . famotidine (PEPCID) IV Stopped (12/10/19 1044)  . feeding supplement (VITAL 1.5 CAL) 45 mL/hr at 12/10/19 0041  . fentaNYL infusion INTRAVENOUS 50 mcg/hr (12/10/19 1117)  . lactated ringers Stopped (12/07/19 1216)  . norepinephrine (LEVOPHED) Adult infusion 4 mcg/min (12/10/19 1117)  . phenylephrine (NEO-SYNEPHRINE) Adult infusion Stopped (12/07/19 1100)  . piperacillin-tazobactam (ZOSYN)  IV    . vasopressin (PITRESSIN) infusion - *FOR SHOCK* Stopped (12/08/19 1830)   PRN Meds:.acetaminophen, dextrose, fentaNYL (SUBLIMAZE) injection, ipratropium-albuterol, midazolam, ondansetron (ZOFRAN) IV, vecuronium No Known Allergies Review of Systems  Unable to perform ROS: Intubated    Physical Exam Constitutional:      Appearance: He is  underweight. He is ill-appearing.     Interventions: He is sedated and intubated.     Comments: unresponsive  Cardiovascular:     Rate and Rhythm: Normal rate.  Pulmonary:     Effort: He is intubated.  Abdominal:     Palpations: Abdomen is soft.  Musculoskeletal:     Comments: L AKA  Skin:    General: Skin is warm and dry.     Vital Signs: BP (!) 84/67   Pulse 73   Temp 98.7 F (37.1 C) (Oral)   Resp (!) 27   Ht 6' (1.829 m)   Wt 63 kg   SpO2 94%   BMI 18.84 kg/m  Pain Scale: CPOT       SpO2: SpO2: 94 % O2 Device:SpO2: 94 % O2 Flow Rate: .O2 Flow Rate (L/min): 15 L/min  IO: Intake/output summary:   Intake/Output Summary (Last 24 hours) at 12/10/2019 1536 Last data filed at 12/10/2019 1348 Gross per 24 hour  Intake 757.53 ml  Output 605 ml  Net 152.53 ml    LBM: Last BM Date: 12/09/19 Baseline Weight: Weight: 49.9 kg Most recent weight: Weight: 63 kg     Palliative Assessment/Data: PPS 30%    Time Total: 80 minutes Greater than 50%  of this time was spent counseling and coordinating care related to the above assessment and plan.  Juel Burrow, DNP, AGNP-C Palliative Medicine Team [phone] Pager: [phone]

## 2019-12-10 NOTE — Progress Notes (Signed)
Tube holder changed and ETT resecured 25cm at the lip. Pt has ulcerated area on left upper lip. RN aware.

## 2019-12-10 NOTE — Progress Notes (Signed)
Pharmacy Electrolyte Monitoring Consult:  Pharmacy consulted to assist in monitoring and replacing electrolytes in this 64 y.o. male admitted on 12/19/2019 with cardiac arrest and respiratory failure secondary to suspected intentional overdose. Patient in DKA.   Labs:  Sodium (mmol/L)  Date Value  12/10/2019 145   Potassium (mmol/L)  Date Value  12/10/2019 4.1   Magnesium (mg/dL)  Date Value  12/09/2019 2.3   Phosphorus (mg/dL)  Date Value  12/09/2019 6.1 (H)   Calcium (mg/dL)  Date Value  12/10/2019 7.7 (L)   Albumin (g/dL)  Date Value  11/23/2019 2.0 (L)  12/19/2019 2.6 (L)    Assessment/Plan: Potassium WNL. Renal function continues to decline.  No additional repletion needed at this time. Will follow up electrolytes with morning labs.  Will replace for goal potassium ~ 4 and magnesium ~ 2.   Pharmacy will continue to monitor and adjust per consult.   Tawnya Crook, PharmD 12/10/2019 10:49 AM                          ,

## 2019-12-10 NOTE — Progress Notes (Signed)
Pt transported to MRI and returned to ICU on the vent without incident. Pt tol well.

## 2019-12-11 DIAGNOSIS — J9600 Acute respiratory failure, unspecified whether with hypoxia or hypercapnia: Secondary | ICD-10-CM

## 2019-12-11 LAB — CBC WITH DIFFERENTIAL/PLATELET
Abs Immature Granulocytes: 0.04 10*3/uL (ref 0.00–0.07)
Basophils Absolute: 0.1 10*3/uL (ref 0.0–0.1)
Basophils Relative: 1 %
Eosinophils Absolute: 0 10*3/uL (ref 0.0–0.5)
Eosinophils Relative: 0 %
HCT: 25 % — ABNORMAL LOW (ref 39.0–52.0)
Hemoglobin: 8.6 g/dL — ABNORMAL LOW (ref 13.0–17.0)
Immature Granulocytes: 1 %
Lymphocytes Relative: 10 %
Lymphs Abs: 0.8 10*3/uL (ref 0.7–4.0)
MCH: 27.7 pg (ref 26.0–34.0)
MCHC: 34.4 g/dL (ref 30.0–36.0)
MCV: 80.6 fL (ref 80.0–100.0)
Monocytes Absolute: 0.7 10*3/uL (ref 0.1–1.0)
Monocytes Relative: 10 %
Neutro Abs: 6 10*3/uL (ref 1.7–7.7)
Neutrophils Relative %: 78 %
Platelets: 64 10*3/uL — ABNORMAL LOW (ref 150–400)
RBC: 3.1 MIL/uL — ABNORMAL LOW (ref 4.22–5.81)
RDW: 15.1 % (ref 11.5–15.5)
Smear Review: NORMAL
WBC: 7.5 10*3/uL (ref 4.0–10.5)
nRBC: 19.9 % — ABNORMAL HIGH (ref 0.0–0.2)

## 2019-12-11 LAB — BASIC METABOLIC PANEL
Anion gap: 14 (ref 5–15)
BUN: 88 mg/dL — ABNORMAL HIGH (ref 8–23)
CO2: 23 mmol/L (ref 22–32)
Calcium: 7.7 mg/dL — ABNORMAL LOW (ref 8.9–10.3)
Chloride: 111 mmol/L (ref 98–111)
Creatinine, Ser: 3.19 mg/dL — ABNORMAL HIGH (ref 0.61–1.24)
GFR calc Af Amer: 22 mL/min — ABNORMAL LOW (ref >60)
GFR calc non Af Amer: 19 mL/min — ABNORMAL LOW (ref >60)
Glucose, Bld: 151 mg/dL — ABNORMAL HIGH (ref 70–99)
Potassium: 3.4 mmol/L — ABNORMAL LOW (ref 3.5–5.1)
Sodium: 148 mmol/L — ABNORMAL HIGH (ref 135–145)

## 2019-12-11 LAB — CULTURE, BLOOD (ROUTINE X 2)
Culture: NO GROWTH
Special Requests: ADEQUATE

## 2019-12-11 LAB — GLUCOSE, CAPILLARY
Glucose-Capillary: 148 mg/dL — ABNORMAL HIGH (ref 70–99)
Glucose-Capillary: 145 mg/dL — ABNORMAL HIGH (ref 70–99)
Glucose-Capillary: 105 mg/dL — ABNORMAL HIGH (ref 70–99)

## 2019-12-11 LAB — MAGNESIUM: Magnesium: 2.6 mg/dL — ABNORMAL HIGH (ref 1.7–2.4)

## 2019-12-11 MED ORDER — POTASSIUM CHLORIDE 20 MEQ PO PACK
20.0000 meq | PACK | Freq: Once | ORAL | Status: AC
  Administered 2019-12-11: 20 meq
  Filled 2019-12-11: qty 1

## 2019-12-11 MED ORDER — PIPERACILLIN-TAZOBACTAM 3.375 G IVPB
3.3750 g | Freq: Three times a day (TID) | INTRAVENOUS | Status: DC
  Administered 2019-12-11: 08:00:00 3.375 g via INTRAVENOUS

## 2019-12-11 NOTE — Progress Notes (Signed)
Daily Progress Note   Patient Name: Richard [name]       Date: 12/11/2019 DOB: [birth year]  Age: 64 y.o. MRN#: [id number] Attending Physician: Flora Lipps, MD Primary Care Physician: Patient, No Pcp Per Admit Date: 12/05/2019  Reason for Consultation/Follow-up: Establishing goals of care  Subjective: Patient remains intubated, unresponsive to verbal or physical stimuli. No family at bedside.   Length of Stay: 4  Current Medications: Scheduled Meds:  . amiodarone  150 mg Intravenous Once  . chlorhexidine gluconate (MEDLINE KIT)  15 mL Mouth Rinse BID  . mouth rinse  15 mL Mouth Rinse 10 times per day    Continuous Infusions: . sodium chloride Stopped (12/07/19 0110)  . amiodarone 30 mg/hr (12/11/19 0400)  . fentaNYL infusion INTRAVENOUS 200 mcg/hr (12/11/19 0400)  . norepinephrine (LEVOPHED) Adult infusion 2 mcg/min (12/11/19 0400)    PRN Meds: acetaminophen, fentaNYL (SUBLIMAZE) injection, midazolam, ondansetron (ZOFRAN) IV, vecuronium  Physical Exam Constitutional:      General: He is not in acute distress.    Appearance: He is underweight. He is ill-appearing.     Interventions: He is intubated.  Cardiovascular:     Rate and Rhythm: Normal rate. Rhythm irregular.  Pulmonary:     Effort: He is intubated.  Musculoskeletal:     Comments: L AKA  Skin:    General: Skin is warm and dry.  Neurological:     Comments: unresponsive             Vital Signs: BP 120/83   Pulse 82   Temp 97.8 F (36.6 C) (Axillary)   Resp 18   Ht 6' (1.829 m)   Wt 65 kg   SpO2 93%   BMI 19.43 kg/m  SpO2: SpO2: 93 % O2 Device: O2 Device: Ventilator O2 Flow Rate: O2 Flow Rate (L/min): 15 L/min  Intake/output summary:   Intake/Output Summary (Last 24 hours) at 12/11/2019 1312 Last data filed at  12/11/2019 1100 Gross per 24 hour  Intake 3981.52 ml  Output 695 ml  Net 3286.52 ml   LBM: Last BM Date: 12/10/19 Baseline Weight: Weight: 49.9 kg Most recent weight: Weight: 65 kg       Palliative Assessment/Data: PPS 10%    Flowsheet Rows     Most Recent Value  Intake Tab  Referral Department  Critical care  Unit at Time of Referral  ICU  Palliative Care Primary Diagnosis  Pulmonary  Date Notified  12/10/19  Palliative Care Type  Return patient Palliative Care  Reason for referral  Clarify Goals of Care  Date of Admission  11/23/2019  Date first seen by Palliative Care  12/10/19  # of days Palliative referral response time  0 Day(s)  # of days IP prior to Palliative referral  4  Clinical Assessment  Palliative Performance Scale Score  30%  Psychosocial & Spiritual Assessment  Palliative Care Outcomes  Patient/Family meeting held?  Yes  Who was at the meeting?  sig other, sister  Palliative Care Outcomes  Clarified goals of care, Provided end of life care assistance, Provided advance care planning, Provided psychosocial or spiritual support, Changed CPR status      Patient Active Problem List   Diagnosis Date  Noted  . Goals of care, counseling/discussion   . DNR (do not resuscitate)   . Palliative care by specialist   . CHF (congestive heart failure), NYHA class II, acute, systolic ([place]) 89/38/1017  . MDD (major depressive disorder) 11/04/2019  . Major depressive disorder, recurrent episode, moderate ([place]) 11/01/2019  . Acute respiratory failure ([place]) 10/29/2019  . PAD (peripheral artery disease) ([place]) 04/28/2019  . Below-knee amputation of left lower extremity ([place]) 04/20/2019  . Cocaine abuse ([place])   . Pressure injury of skin 03/12/2019  . Respiratory failure requiring intubation ([place]) 02/28/2019  . Diabetes with hyperosmolar coma ([place]) 11/11/2018  . A-fib ([place]) 09/20/2018  . Hyperglycemia 08/03/2018  . Protein-calorie malnutrition, severe 01/10/2018  . Sepsis  ([place]) 01/09/2018  . DKA, type 2 ([place]) 09/05/2017  . Ileus ([place]) 08/24/2017  . Diabetes ([place]) 08/24/2017  . Atrial flutter ([place]) 08/24/2017  . CAD (coronary artery disease) 08/24/2017  . HTN (hypertension) 08/24/2017  . Atrial fibrillation ([place]) 08/24/2017  . Wound dehiscence, surgical, initial encounter 06/24/2017  . Acute hematogenous osteomyelitis of right foot ([place]) 06/20/2017  . Adenocarcinoma of pancreas ([place]) 06/20/2017  . Esophagitis 06/20/2017  . Cellulitis of right lower extremity 06/10/2017  . Hypertension, poor control 06/10/2017  . Insulin dependent type 2 diabetes mellitus ([place]) 06/10/2017  . Chest pain 08/10/2015    Palliative Care Assessment & Plan   HPI: 64 y.o. male  with past medical history of CHF, a fib, CAD, DM, HTN, pancreatic cancer, L AKA, and substance abuse admitted on 11/23/2019 with septic shock, DKA, and severe respiratory failure. Patient with drug overdose and aspiration pna. He was intubated in ED. Now has AKI with creatinine up to 3.51. MRI revealed watershed infarcts. Patient is unable to wean from ventilator d/t severe multiorgan failure and brain damage. PMT consulted for [place].   Assessment: No change in patient status.   Called and spoke with patients girlfriend/HCPOA - Wynn Banker. Confirmed plan for terminal extubation once family is able to come visit. We discussed discontinuing measures that are not adding to Mr. Bjorkman comfort - [name] agrees.   Called and spoke with patient's sister - she lives in [place] - she is coming with her mother to [place] today - they are unsure if they will be at the hospital this evening or tomorrow morning but once they come they are ready to proceed with terminal extubation. Deneen tells me she has spoken with their sister Jeanmarie Hubert is not coming but agrees with plan.   Updated front desk visitation to allow the follow visitors to be present prior to Mr. Salvia' extubation: Wynn Banker (girlfriend/HCPOA), Rachell Cipro (daughter), Bridget Hartshorn (sister), and Coleby Yett (mother)  Recommendations/Plan:  Terminal extubation once family members listed above have been able to visit Mr. Say - continue vent and pressors until then  GIRLFRIEND [place] (documentation can be found under ACP in EPIC or in chart review media tab)  Discontinued measures not needed given goals of care - labs, tube feeding, antibiotics  MOST listed in chart patient previously selected DNR status, avoid ICU care (May 2020)  Goals of Care and Additional Recommendations:  Limitations on Scope of Treatment: No Tracheostomy  Code Status:  DNR  Prognosis:  Anticipate death following one way extubation  Discharge Planning:  Anticipated Hospital Death  Care plan was discussed with RN, Dr. Patsey Berthold, girlfriend/HCPOA, sister  Thank you for allowing the Palliative Medicine Team to assist in the care of this patient.  Total Time 40 mintes Prolonged Time Billed  no       Greater than 50%  of this time was spent counseling and coordinating care related to the above assessment and plan.  Juel Burrow, DNP, [facility] Palliative Medicine Team Team Phone # [phone]  Pager [phone]

## 2019-12-11 NOTE — Progress Notes (Signed)
Subjective: Patient remains intubated and sedated  Objective: Current vital signs: BP 100/60   Pulse 72   Temp 97.8 F (36.6 C) (Axillary)   Resp (!) 29   Ht 6' (1.829 m)   Wt 65 kg   SpO2 95%   BMI 19.43 kg/m  Vital signs in last 24 hours: Temp:  [97.7 F (36.5 C)-98.7 F (37.1 C)] 97.8 F (36.6 C) (02/19 0400) Pulse Rate:  [25-133] 72 (02/19 0500) Resp:  [10-29] 29 (02/19 0500) BP: (78-143)/(57-87) 100/60 (02/19 0500) SpO2:  [77 %-97 %] 95 % (02/19 0810) FiO2 (%):  [28 %-35 %] 28 % (02/19 0810) Weight:  [65 kg] 65 kg (02/19 0333)  Intake/Output from previous day: 02/18 0701 - 02/19 0700 In: 3587 [I.V.:1381.7; NG/GT:2055.3; IV Piggyback:150.1] Out: 970 [Urine:970] Intake/Output this shift: No intake/output data recorded. Nutritional status:  Diet Order            Diet NPO time specified  Diet effective now              Neurologic Exam: Mental Status: Patient does not respond to verbal stimuli. Does not respond to deep sternal rub. Does not follow commands. No verbalizations noted.  Cranial Nerves: II: patient does not respond confrontation bilaterally, pupils right93m, left 334mand reactivebilaterally III,IV,VI: Oculocephalic response absent bilaterally.  V,VII: corneal reflexpresentbilaterally VIII: patient does not respond to verbal stimuli IX,X: gag reflexreduced, XI: trapezius strength unable to test bilaterally XII: tongue strength unable to test Motor: Extremities flaccid throughout. No spontaneous movement noted. No purposeful movements noted. Sensory: Does not respond to noxious stimuli in any extremity.   Lab Results: Basic Metabolic Panel: Recent Labs  Lab 11/23/2019 1459 12/07/2019 1629 12/20/2019 1929 12/07/19 1117 12/07/19 1117 12/07/19 1700 12/07/19 1700 12/08/19 0340 12/08/19 0340 12/09/19 0443 12/10/19 0354 12/11/19 0339  NA 136  142  --    < > 143   < > 145  --  144  --  145 145 148*  K 4.2  2.6*  --    < > 3.9    < > 4.5  --  5.1  --  4.6 4.1 3.4*  CL 100  114*  --    < > 114*   < > 114*  --  110  --  110 110 111  CO2 19*  17*  --    < > 23   < > 22  --  24  --  27 24 23   GLUCOSE 807*  625*  --    < > 124*   < > 128*  --  189*  --  167* 239* 151*  BUN 21  17  --    < > 28*   < > 31*  --  35*  --  53* 72* 88*  CREATININE 1.55*  1.11  --    < > 2.11*   < > 2.04*  --  2.55*  --  3.33* 3.51* 3.19*  CALCIUM 8.4*  6.0*  --    < > 7.6*   < > 7.5*   < > 7.4*   < > 7.6* 7.7* 7.7*  MG  --  1.8  --  2.0  --   --   --  1.8  --  2.3  --  2.6*  PHOS 7.5*  --   --  5.3*  --   --   --  5.1*  --  6.1*  --   --    < > =  values in this interval not displayed.    Liver Function Tests: Recent Labs  Lab 11/28/2019 1459  AST 28  15  ALT 13  10  ALKPHOS 27*  21*  BILITOT 1.0  0.9  PROT 5.8*  4.4*  ALBUMIN 2.6*  2.0*   No results for input(s): LIPASE, AMYLASE in the last 168 hours. Recent Labs  Lab 12/19/2019 1309  AMMONIA 29    CBC: Recent Labs  Lab 12/02/2019 1459 11/26/2019 1459 12/07/19 0651 12/08/19 0340 12/09/19 0443 12/10/19 0354 12/11/19 0339  WBC 4.3   < > 4.3 10.4 9.3 7.0 7.5  NEUTROABS 3.6  --   --   --  8.5* 6.3 6.0  HGB 8.8*   < > 10.9* 9.9* 9.2* 9.0* 8.6*  HCT 28.7*   < > 36.4* 30.4* 27.7* 27.8* 25.0*  MCV 90.8   < > 91.2 87.1 84.5 84.8 80.6  PLT 169   < > 157 118* 99* 73* 64*   < > = values in this interval not displayed.    Cardiac Enzymes: Recent Labs  Lab 12/09/19 1031  CKTOTAL 845*    Lipid Panel: Recent Labs  Lab 12/08/19 2020  TRIG 67    CBG: Recent Labs  Lab 12/10/19 1544 12/10/19 1909 12/10/19 2325 12/11/19 0327 12/11/19 0731  GLUCAP 132* 143* 202* 148* 145*    Microbiology: Results for orders placed or performed during the hospital encounter of 11/28/2019  Culture, blood (routine x 2)     Status: None   Collection Time: 11/26/2019  1:01 PM   Specimen: BLOOD  Result Value Ref Range Status   Specimen Description BLOOD BLOOD RIGHT FOREARM  Final    Special Requests   Final    BOTTLES DRAWN AEROBIC AND ANAEROBIC Blood Culture adequate volume   Culture   Final    NO GROWTH 5 DAYS Performed at [facility], [address] [place], [place] [address]    Report Status 12/11/2019 FINAL  Final  Respiratory Panel by RT PCR (Flu A&B, Covid) - Nasopharyngeal Swab     Status: None   Collection Time: 12/12/2019  2:35 PM   Specimen: Nasopharyngeal Swab  Result Value Ref Range Status   SARS Coronavirus 2 by RT PCR NEGATIVE NEGATIVE Final    Comment: (NOTE) SARS-CoV-2 target nucleic acids are NOT DETECTED. The SARS-CoV-2 RNA is generally detectable in upper respiratoy specimens during the acute phase of infection. The lowest concentration of SARS-CoV-2 viral copies this assay can detect is 131 copies/mL. A negative result does not preclude SARS-Cov-2 infection and should not be used as the sole basis for treatment or other patient management decisions. A negative result may occur with  improper specimen collection/handling, submission of specimen other than nasopharyngeal swab, presence of viral mutation(s) within the areas targeted by this assay, and inadequate number of viral copies (<131 copies/mL). A negative result must be combined with clinical observations, patient history, and epidemiological information. The expected result is Negative. Fact Sheet for Patients:  PinkCheek.be Fact Sheet for Healthcare Providers:  GravelBags.it This test is not yet ap proved or cleared by the [place] FDA and  has been authorized for detection and/or diagnosis of SARS-CoV-2 by FDA under an Emergency Use Authorization (EUA). This EUA will remain  in effect (meaning this test can be used) for the duration of the COVID-19 declaration under Section 564(b)(1) of the Act, 21 U.S.C. section 360bbb-3(b)(1), unless the authorization is terminated or revoked sooner.    Influenza A by  PCR  NEGATIVE NEGATIVE Final   Influenza B by PCR NEGATIVE NEGATIVE Final    Comment: (NOTE) The Xpert Xpress SARS-CoV-2/FLU/RSV assay is intended as an aid in  the diagnosis of influenza from Nasopharyngeal swab specimens and  should not be used as a sole basis for treatment. Nasal washings and  aspirates are unacceptable for Xpert Xpress SARS-CoV-2/FLU/RSV  testing. Fact Sheet for Patients: PinkCheek.be Fact Sheet for Healthcare Providers: GravelBags.it This test is not yet approved or cleared by the [place] FDA and  has been authorized for detection and/or diagnosis of SARS-CoV-2 by  FDA under an Emergency Use Authorization (EUA). This EUA will remain  in effect (meaning this test can be used) for the duration of the  Covid-19 declaration under Section 564(b)(1) of the Act, 21  U.S.C. section 360bbb-3(b)(1), unless the authorization is  terminated or revoked. Performed at [facility], [address] [place], [place] [address]   MRSA PCR Screening     Status: Abnormal   Collection Time: 12/07/19 11:17 AM   Specimen: Nasopharyngeal  Result Value Ref Range Status   MRSA by PCR POSITIVE (A) NEGATIVE Final    Comment:        The GeneXpert MRSA Assay (FDA approved for NASAL specimens only), is one component of a comprehensive MRSA colonization surveillance program. It is not intended to diagnose MRSA infection nor to guide or monitor treatment for MRSA infections. RESULT CALLED TO, READ BACK BY AND VERIFIED WITH: Netta Neat RN AT 3710 ON 12/07/19 SNG Performed at [facility], [address] [place], [place] [address]   Culture, respiratory (non-expectorated)     Status: None   Collection Time: 12/08/19  4:44 AM   Specimen: Tracheal Aspirate; Respiratory  Result Value Ref Range Status   Specimen Description   Final    TRACHEAL ASPIRATE Performed at [facility], [address], [place], [place] [address]    Special Requests   Final    NONE Performed at [facility], [address] [place], [place] [address]    Gram Stain   Final    FEW WBC PRESENT, PREDOMINANTLY PMN FEW YEAST Performed at [facility], [place] [address], [place],  [address]    Culture FEW CANDIDA ALBICANS  Final   Report Status 12/10/2019 FINAL  Final    Coagulation Studies: No results for input(s): LABPROT, INR in the last 72 hours.  Imaging: MR BRAIN WO CONTRAST  Result Date: 12/10/2019 CLINICAL DATA:  Overdose an aspiration. Unresponsive. Anoxic brain injury. EXAM: MRI HEAD WITHOUT CONTRAST TECHNIQUE: Multiplanar, multiecho pulse sequences of the brain and surrounding structures were obtained without intravenous contrast. COMPARISON:  Head CT 4 days ago. FINDINGS: Brain: Patient does not show a pattern of widespread hypoxic ischemic brain injury. No abnormality is seen affecting the brainstem. Few old small vessel cerebellar infarctions. Left cerebral hemisphere shows a single punctate acute infarction in the left posteromedial parietal region. Right hemisphere shows scattered acute infarctions in a watershed distribution from front to back at the anterior cerebral middle cerebral junction. The largest single infarction is a 2.5 cm infarction at the right frontal region. No large confluent infarction. No sign of swelling or hemorrhage. Elsewhere, there chronic small-vessel ischemic changes of the deep white matter. No evidence of mass, hemorrhage, hydrocephalus or extra-axial collection. Vascular: Major vessels at the base of the brain show flow. Skull and upper cervical spine: Negative Sinuses/Orbits: Clear/normal Other: None IMPRESSION: No evidence of widespread hypoxic  ischemic injury. 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 No hemorrhage or mass effect. Single punctate infarction of the left posterior parietal brain.  Electronically Signed   By: Nelson Chimes M.D.   On: 12/10/2019 03:30   DG Chest Port 1 View  Result Date: 12/10/2019 CLINICAL DATA:  Acute respiratory failure. EXAM: PORTABLE CHEST 1 VIEW COMPARISON:  12/08/2019 FINDINGS: Endotracheal tube, enteric tube, and right internal jugular central line are unchanged in position. Patchy opacities throughout the right hemithorax with slight improvement. Questionable cavitary lesion in the right upper lobe again seen. Lucency paralleling the right chest wall favored to represent a skin fold with pulmonary markings extending beyond to the pleural surface. Patchy opacity in the left upper lobe appears similar. Unchanged heart size and mediastinal contours with aortic atherosclerosis. IMPRESSION: 1. Slight improvement in patchy opacities throughout the right hemithorax. Questionable cavitary lesion in the right upper lobe again seen. Left upper lobe opacities are unchanged. 2. Lucency paralleling the right chest wall favored to represent a skin fold. Electronically Signed   By: Keith Rake M.D.   On: 12/10/2019 00:46    Medications:  I have reviewed the patient's current medications. Scheduled: . amiodarone  150 mg Intravenous Once  . chlorhexidine gluconate (MEDLINE KIT)  15 mL Mouth Rinse BID  . Chlorhexidine Gluconate Cloth  6 each Topical Daily  . feeding supplement (PRO-STAT SUGAR FREE 64)  30 mL Per Tube TID  . insulin aspart  0-9 Units Subcutaneous Q4H  . insulin glargine  20 Units Subcutaneous Daily  . mouth rinse  15 mL Mouth Rinse 10 times per day  . mupirocin ointment  1 application Nasal BID  . vecuronium  10 mg Intravenous Once    Assessment/Plan: 64 year old male with a history of polysubstance abuse admittedwithseptic shock, DKA, and severeacutehypoxichypercapnicrespiratoryfailure secondary to drug overdose(cocaine & Benzo's)and aspiration pneumonia. Now intubated and sedated. Due to sedation neurological examination is  suboptimal. Renal functions worsening.  EEG reviewed and is only significant for slowing and triphasic waves, consistent with encephaloathy. No epileptiform activity is noted. MRI of the brain personally reviewed and shows small watershed infarctions in the right cerebral hemisphere.  There is also a punctate infarction in the left posterior parietal lobe.  Suspect embolic etiology although hypoperfusion related to presentation a possibility as well.  This injury is not significant enough to explain patient's mental status.  Echocardiogram shows EF of 40-45% with no evidence of thrombus or signs of endocarditis.    Recommendations: 1. Agree with continued addressing of medical issues 2. A1c, fasting lipid panel 3. Carotid dopplers to be performed once patient extubated 4. Telemetry 5. Frequent neuro checks    LOS: 4 days   [name], MD Neurology [phone] 12/11/2019  10:17 AM

## 2019-12-11 NOTE — Progress Notes (Signed)
CRITICAL CARE NOTE [name] admitted withSeptic shock, DKA, and severeAcute Hypoxic Hypercapnic Respiratory Failure secondary to drug overdose(cocaine & Benzo's)and aspiration pneumonia. Was intubated in the ED. Now with AKI.  CULTURES: SARS-CoV-2 PCR 2/14>> negative Influenza PCR 2/14>>negative Blood culture 2/14>>NEG Tracheal aspirate 2/15>> Strep pneumo urinary antigen 2/15>>POSITIVE Legionella urinary antigen 2/15>>NEGATIVE  ANTIBIOTICS: Cefepime 2/14>>2/14 Flagyl 2/14>>2/14 Vancomycin 2/14>>2/14 Unasyn 2/14>>  SIGNIFICANT EVENTS: 2/14>> Presented to ED 2/14>> No ICU beds available at [facility], to be transferred to [facility] 2/15>>No ICU beds available at [facility], will be admitted to [facility] ICU as beds now available 2/17 SEVERE RESP FAILURE, CONCERNS FOR BRAIN INJURY EEG NOTED, MRI PENDING 2/17 I CALLED SISTER MULTIPLE  TIMES, NO ANSWER AND ALSO I WAS UNABLE TO LEAVE VM 2/18 severe respiratory failure severe encephalopathy severe brain damage 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  On wake-up assessment remains obtunded with neurogenic breathing pattern.  LINES/TUBES: ET Tube 2/14>> Right IJ CVC (placed by ED provider) 2/14>>  CC  follow up respiratory failure  SUBJECTIVE Unresponsive when sedation is off Neurogenic breathing pattern Prognosis is guarded   BP 98/65   Pulse (!) 41   Temp 97.8 F (36.6 C) (Axillary)   Resp (!) 24   Ht 6' (1.829 m)   Wt 65 kg   SpO2 92%   BMI 19.43 kg/m    I/O last 3 completed shifts: In: 3906.8 [I.V.:1651.4; NG/GT:2055.3; IV Piggyback:200.1] Out: 1130 [Urine:1130] Total I/O In: 955.2 [I.V.:507.7; NG/GT:360; IV Piggyback:87.5] Out: 550 [Urine:550]  SpO2: 92 % O2 Flow Rate (L/min): 15 L/min FiO2 (%): 28 %   REVIEW OF SYSTEMS:  Patient is unable to provide due to obtunded status and intubated  status  PHYSICAL EXAMINATION:  GENERAL: Critically ill male intubated, mechanically ventilated, unresponsive. HEAD: Normocephalic, atraumatic.  EYES: Pupils midpoint equal, round, reactive to light.  No scleral icterus.  MOUTH: Orotracheally intubated, OG in place.  Oral mucosa moist. NECK: Supple.  No tracheal deviation, no JVD, no crepitus. PULMONARY: Coarse breath sounds throughout, no other adventitious sounds. CARDIOVASCULAR: S1 and S2.  Bradycardic rate with regular rhythm. No murmurs, rubs, or gallops.  GASTROINTESTINAL: Soft, non-distended.  Positive bowel sounds.   MUSCULOSKELETAL: No joint deformity, no clubbing, or edema.  NEUROLOGIC: obtunded, GCS<8, flaccid extremities, no spontaneous movement, neurogenic breathing pattern noted. SKIN:intact,warm,dry, no overt rashes noted.  MEDICATIONS: I have reviewed all medications and confirmed regimen as documented   CULTURE RESULTS   Recent Results (from the past 240 hour(s))  Culture, blood (routine x 2)     Status: None   Collection Time: 12/12/2019  1:01 PM   Specimen: BLOOD  Result Value Ref Range Status   Specimen Description BLOOD BLOOD RIGHT FOREARM  Final   Special Requests   Final    BOTTLES DRAWN AEROBIC AND ANAEROBIC Blood Culture adequate volume   Culture   Final    NO GROWTH 5 DAYS Performed at [facility], [address] [place], [place] [address]    Report Status 12/11/2019 FINAL  Final  Respiratory Panel by RT PCR (Flu A&B, Covid) - Nasopharyngeal Swab     Status: None   Collection Time: 12/04/2019  2:35 PM   Specimen: Nasopharyngeal Swab  Result Value Ref Range Status   SARS Coronavirus 2 by RT PCR NEGATIVE NEGATIVE Final    Comment: (NOTE) SARS-CoV-2 target nucleic acids are NOT DETECTED. The SARS-CoV-2 RNA is generally detectable  in upper respiratoy specimens during the acute phase of infection. The lowest concentration of SARS-CoV-2 viral copies this assay can detect is 131 copies/mL. A  negative result does not preclude SARS-Cov-2 infection and should not be used as the sole basis for treatment or other patient management decisions. A negative result may occur with  improper specimen collection/handling, submission of specimen other than nasopharyngeal swab, presence of viral mutation(s) within the areas targeted by this assay, and inadequate number of viral copies (<131 copies/mL). A negative result must be combined with clinical observations, patient history, and epidemiological information. The expected result is Negative. Fact Sheet for Patients:  PinkCheek.be Fact Sheet for Healthcare Providers:  GravelBags.it This test is not yet ap proved or cleared by the [place] FDA and  has been authorized for detection and/or diagnosis of SARS-CoV-2 by FDA under an Emergency Use Authorization (EUA). This EUA will remain  in effect (meaning this test can be used) for the duration of the COVID-19 declaration under Section 564(b)(1) of the Act, 21 U.S.C. section 360bbb-3(b)(1), unless the authorization is terminated or revoked sooner.    Influenza A by PCR NEGATIVE NEGATIVE Final   Influenza B by PCR NEGATIVE NEGATIVE Final    Comment: (NOTE) The Xpert Xpress SARS-CoV-2/FLU/RSV assay is intended as an aid in  the diagnosis of influenza from Nasopharyngeal swab specimens and  should not be used as a sole basis for treatment. Nasal washings and  aspirates are unacceptable for Xpert Xpress SARS-CoV-2/FLU/RSV  testing. Fact Sheet for Patients: PinkCheek.be Fact Sheet for Healthcare Providers: GravelBags.it This test is not yet approved or cleared by the [place] FDA and  has been authorized for detection and/or diagnosis of SARS-CoV-2 by  FDA under an Emergency Use Authorization (EUA). This EUA will remain  in effect (meaning this test can be used) for  the duration of the  Covid-19 declaration under Section 564(b)(1) of the Act, 21  U.S.C. section 360bbb-3(b)(1), unless the authorization is  terminated or revoked. Performed at [facility], [address] [place], [place] [address]   MRSA PCR Screening     Status: Abnormal   Collection Time: 12/07/19 11:17 AM   Specimen: Nasopharyngeal  Result Value Ref Range Status   MRSA by PCR POSITIVE (A) NEGATIVE Final    Comment:        The GeneXpert MRSA Assay (FDA approved for NASAL specimens only), is one component of a comprehensive MRSA colonization surveillance program. It is not intended to diagnose MRSA infection nor to guide or monitor treatment for MRSA infections. RESULT CALLED TO, READ BACK BY AND VERIFIED WITH: Netta Neat RN AT [id number] ON 12/07/19 SNG Performed at [facility], [address] [place], [place] [address]   Culture, respiratory (non-expectorated)     Status: None   Collection Time: 12/08/19  4:44 AM   Specimen: Tracheal Aspirate; Respiratory  Result Value Ref Range Status   Specimen Description   Final    TRACHEAL ASPIRATE Performed at [facility], [address] [place], [place] [address]    Special Requests   Final    NONE Performed at [facility], [address] [place], [place] [address]    Gram Stain   Final    FEW WBC PRESENT, PREDOMINANTLY PMN FEW YEAST Performed at [facility], [place] [address] [place], [place] [address]    Culture FEW CANDIDA ALBICANS  Final   Report Status 12/10/2019 FINAL  Final  IMAGING    No results found.     Indwelling Urinary Catheter continued, requirement due to   Reason to continue Indwelling Urinary Catheter strict Intake/Output monitoring for hemodynamic instability   Central Line/ continued, requirement due to  Reason to continue [place] of central venous pressure or other hemodynamic parameters and poor IV access    Ventilator continued, requirement due to severe respiratory failure   Ventilator Sedation RASS 0 to -2      ASSESSMENT AND PLAN SYNOPSIS  Acute respiratory failure with hypoxia Ventilator dependence Acute drug overdose: Cocaine and benzodiazepines Patient remains encephalopathic Unable to wean Neurogenic breathing pattern with ineffective ventilator triggering Family has opted for 1 way extubation and comfort measures   Severe acute encephalopathy, suspect anoxic Watershed infarcts Severe CNS dysfunction As above patient will be transition to comfort care Family coming from out of town Continue support for now  DKA Resolved Continue glycemic control per ICU guidelines  Aspiration pneumonia Strep pneumo urinary antigen positive Rocephin 2 g IV daily  Acute kidney injury Resolved   CARDIAC Decreased left ventricular ejection fraction 40 to 45%   Prophylaxis not indicated at this time as patient is to be transitioned to comfort care Discussed during multidisciplinary rounds  Critical Care Time devoted to patient care services described in this note is 40 minutes.   Overall, patient is critically ill, prognosis is poor. Very poor chance of meaningful recovery. 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  Continue DNR/DNI status.  Will transition to comfort care when family available.    Renold Don, MD [facility]  *This note was dictated using voice recognition software/Dragon.  Despite best efforts to proofread, errors can occur which can change the meaning.  Any change was purely unintentional.

## 2019-12-11 NOTE — Progress Notes (Signed)
Assisted tele visit to patient with family member.  Laren Orama Samson, RN  

## 2019-12-11 NOTE — Progress Notes (Signed)
Pharmacy Electrolyte Monitoring Consult:  Pharmacy consulted to assist in monitoring and replacing electrolytes in this 64 y.o. male admitted on 12/12/2019 with cardiac arrest and respiratory failure secondary to suspected intentional overdose. Patient in DKA.   Labs:  Sodium (mmol/L)  Date Value  12/11/2019 148 (H)   Potassium (mmol/L)  Date Value  12/11/2019 3.4 (L)   Magnesium (mg/dL)  Date Value  12/11/2019 2.6 (H)   Phosphorus (mg/dL)  Date Value  12/09/2019 6.1 (H)   Calcium (mg/dL)  Date Value  12/11/2019 7.7 (L)   Albumin (g/dL)  Date Value  12/07/2019 2.0 (L)  12/05/2019 2.6 (L)    Assessment/Plan: MRI showing watershed infarcts. Palliative consult, plan for one way extubation and comfort care once family arrives. Continue medical treatment until that time.  Potassium slightly low today. Renal function with small improvement this morning.  Potassium 20 mEq per tube for one dose. Will follow up BMP with morning labs. Will likely defer ordering labs after that to MD, but can replace as necessary.  Will replace for goal potassium ~ 4 and magnesium ~ 2.   Pharmacy will continue to monitor and adjust per consult.   Tawnya Crook, PharmD 12/11/2019 12:00 PM

## 2019-12-12 ENCOUNTER — Inpatient Hospital Stay: Payer: Medicare Other

## 2019-12-12 LAB — BASIC METABOLIC PANEL
Anion gap: 10 (ref 5–15)
BUN: 93 mg/dL — ABNORMAL HIGH (ref 8–23)
CO2: 26 mmol/L (ref 22–32)
Calcium: 7.6 mg/dL — ABNORMAL LOW (ref 8.9–10.3)
Chloride: 115 mmol/L — ABNORMAL HIGH (ref 98–111)
Creatinine, Ser: 2.71 mg/dL — ABNORMAL HIGH (ref 0.61–1.24)
GFR calc Af Amer: 27 mL/min — ABNORMAL LOW (ref >60)
GFR calc non Af Amer: 24 mL/min — ABNORMAL LOW (ref >60)
Glucose, Bld: 51 mg/dL — ABNORMAL LOW (ref 70–99)
Potassium: 3.1 mmol/L — ABNORMAL LOW (ref 3.5–5.1)
Sodium: 151 mmol/L — ABNORMAL HIGH (ref 135–145)

## 2019-12-12 LAB — CBC
HCT: 23.5 % — ABNORMAL LOW (ref 39.0–52.0)
Hemoglobin: 8.3 g/dL — ABNORMAL LOW (ref 13.0–17.0)
MCH: 27.9 pg (ref 26.0–34.0)
MCHC: 35.3 g/dL (ref 30.0–36.0)
MCV: 79.1 fL — ABNORMAL LOW (ref 80.0–100.0)
Platelets: 68 10*3/uL — ABNORMAL LOW (ref 150–400)
RBC: 2.97 MIL/uL — ABNORMAL LOW (ref 4.22–5.81)
RDW: 15.4 % (ref 11.5–15.5)
WBC: 9.4 10*3/uL (ref 4.0–10.5)
nRBC: 21.4 % — ABNORMAL HIGH (ref 0.0–0.2)

## 2019-12-12 LAB — GLUCOSE, CAPILLARY
Glucose-Capillary: 125 mg/dL — ABNORMAL HIGH (ref 70–99)
Glucose-Capillary: 107 mg/dL — ABNORMAL HIGH (ref 70–99)
Glucose-Capillary: 146 mg/dL — ABNORMAL HIGH (ref 70–99)
Glucose-Capillary: 19 mg/dL — CL (ref 70–99)
Glucose-Capillary: 61 mg/dL — ABNORMAL LOW (ref 70–99)

## 2019-12-12 MED ORDER — CHLORHEXIDINE GLUCONATE CLOTH 2 % EX PADS
6.0000 | MEDICATED_PAD | Freq: Every day | CUTANEOUS | Status: DC
  Administered 2019-12-12: 6 via TOPICAL

## 2019-12-12 MED ORDER — LORAZEPAM 2 MG/ML IJ SOLN
INTRAMUSCULAR | Status: AC
  Filled 2019-12-12: qty 1

## 2019-12-12 MED ORDER — HEPARIN SODIUM (PORCINE) 5000 UNIT/ML IJ SOLN
5000.0000 [IU] | Freq: Two times a day (BID) | INTRAMUSCULAR | Status: DC
  Administered 2019-12-12: 05:00:00 5000 [IU] via SUBCUTANEOUS
  Filled 2019-12-12: qty 1

## 2019-12-12 MED ORDER — DEXTROSE 50 % IV SOLN
1.0000 | Freq: Once | INTRAVENOUS | Status: AC
  Administered 2019-12-12: 07:00:00 50 mL via INTRAVENOUS

## 2019-12-12 MED ORDER — SODIUM CHLORIDE 0.9% FLUSH: 10.0000 mL | INTRAVENOUS | Status: DC | PRN

## 2019-12-12 MED ORDER — SODIUM CHLORIDE 0.9% FLUSH
10.0000 mL | Freq: Two times a day (BID) | INTRAVENOUS | Status: DC
  Administered 2019-12-12 – 2019-12-13 (×4): 10 mL

## 2019-12-12 MED ORDER — POTASSIUM CHLORIDE 20 MEQ/15ML (10%) PO SOLN
40.0000 meq | Freq: Once | ORAL | Status: DC
  Filled 2019-12-12: qty 30

## 2019-12-12 MED ORDER — PANTOPRAZOLE SODIUM 40 MG IV SOLR
40.0000 mg | Freq: Every day | INTRAVENOUS | Status: DC
  Administered 2019-12-12: 40 mg via INTRAVENOUS
  Filled 2019-12-12: qty 40

## 2019-12-12 MED ORDER — GLYCOPYRROLATE 0.2 MG/ML IJ SOLN
0.4000 mg | INTRAMUSCULAR | Status: DC | PRN
  Administered 2019-12-13: 21:00:00 0.4 mg via INTRAVENOUS
  Filled 2019-12-12 (×2): qty 2

## 2019-12-12 MED ORDER — LORAZEPAM 2 MG/ML IJ SOLN
1.0000 mg | INTRAMUSCULAR | Status: DC | PRN
  Administered 2019-12-12: 18:00:00 1 mg via INTRAVENOUS

## 2019-12-12 NOTE — Progress Notes (Signed)
eLink Physician-Brief Progress Note Patient Name: Richard [name] DOB: [birth year] MRN: [id number]   Date of Service  12/12/2019  HPI/Events of Note  Pt intubated and on the ventilator, he does not have orders for DVT prophylaxis or stress ulcer prophylaxis. Creatinine 3.19, Platelet count 64 K  eICU Interventions  Pantoprazole 40 mg iv Q 24 hours, Heparin SQ 5000 units Q 12 hours.        Kerry Kass Hoa Deriso 12/12/2019, 2:57 AM

## 2019-12-12 NOTE — Progress Notes (Signed)
CRITICAL CARE NOTE [name] admitted withSeptic shock, DKA, and severeAcute Hypoxic Hypercapnic Respiratory Failure secondary to drug overdose(cocaine & Benzo's)and aspiration pneumonia. Was intubated in the ED. Now with AKI.  CULTURES: SARS-CoV-2 PCR 2/14>> negative Influenza PCR 2/14>>negative Blood culture 2/14>>NEG Tracheal aspirate 2/15>> Strep pneumo urinary antigen 2/15>>POSITIVE Legionella urinary antigen 2/15>>NEGATIVE  ANTIBIOTICS: Cefepime 2/14>>2/14 Flagyl 2/14>>2/14 Vancomycin 2/14>>2/14 Unasyn 2/14>>  SIGNIFICANT EVENTS: 2/14>> Presented to ED 2/14>> No ICU beds available at [facility], to [facility] 2/15>>No ICU beds available at [facility] will be admitted to [facility] ICU as beds now available 2/17 SEVERE RESP FAILURE, CONCERNS FOR BRAIN INJURY EEG NOTED, MRI PENDING 2/17 I CALLED SISTER MULTIPLE  TIMES, NO ANSWER AND ALSO I WAS UNABLE TO LEAVE VM 2/18 severe respiratory failure severe encephalopathy severe brain damage 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  On wake-up assessment remains obtunded with neurogenic breathing pattern.   LINES/TUBES: ET Tube 2/14>> Right IJ CVC (placed by ED provider) 2/14>>  CC  follow up respiratory failure  SUBJECTIVE Unresponsive when sedation is off Neurogenic breathing pattern when sedation is off Prognosis is poor   BP 106/69   Pulse 61   Temp 98 F (36.7 C) (Axillary)   Resp (!) 26   Ht 6' (1.829 m)   Wt 65 kg   SpO2 98%   BMI 19.43 kg/m    I/O last 3 completed shifts: In: 2307.1 [I.V.:1382; NG/GT:787.5; IV Piggyback:137.6] Out: 1675 [Urine:1675] Total I/O In: 160.6 [I.V.:160.6] Out: 250 [Urine:250]  SpO2: 98 % O2 Flow Rate (L/min): 15 L/min FiO2 (%): 35 %   REVIEW OF SYSTEMS:  Patient is unable to provide due to obtunded status and intubated status  PHYSICAL  EXAMINATION:  GENERAL: Critically ill male intubated, mechanically ventilated, unresponsive. HEAD: Normocephalic, atraumatic.  EYES: Pupils midpoint equal, round, reactive to light.  No scleral icterus.  MOUTH: Orotracheally intubated, OG in place.  Oral mucosa moist. NECK: Supple.  No tracheal deviation, no JVD, no crepitus. PULMONARY: Coarse breath sounds throughout, no other adventitious sounds. CARDIOVASCULAR: S1 and S2.  Bradycardic rate with regular rhythm. No murmurs, rubs, or gallops.  GASTROINTESTINAL: Soft, non-distended.  Positive bowel sounds.   MUSCULOSKELETAL: No joint deformity, no clubbing, or edema.  NEUROLOGIC: obtunded, GCS<8, flaccid extremities, no spontaneous movement, neurogenic breathing pattern noted. SKIN:intact,warm,dry, no overt rashes noted.  No change on physical exam from yesterday   MEDICATIONS: I have reviewed all medications and confirmed regimen as documented   CULTURE RESULTS   Recent Results (from the past 240 hour(s))  Culture, blood (routine x 2)     Status: None   Collection Time: 11/24/2019  1:01 PM   Specimen: BLOOD  Result Value Ref Range Status   Specimen Description BLOOD BLOOD RIGHT FOREARM  Final   Special Requests   Final    BOTTLES DRAWN AEROBIC AND ANAEROBIC Blood Culture adequate volume   Culture   Final    NO GROWTH 5 DAYS Performed at [facility], [address] [place], [place] [address]    Report Status 12/11/2019 FINAL  Final  Respiratory Panel by RT PCR (Flu A&B, Covid) - Nasopharyngeal Swab     Status: None   Collection Time: 11/28/2019  2:35 PM   Specimen: Nasopharyngeal Swab  Result Value Ref Range Status   SARS Coronavirus 2 by RT PCR NEGATIVE NEGATIVE Final    Comment: (NOTE) SARS-CoV-2 target nucleic  acids are NOT DETECTED. The SARS-CoV-2 RNA is generally detectable in upper respiratoy specimens during the acute phase of infection. The lowest concentration of SARS-CoV-2 viral copies this assay can  detect is 131 copies/mL. A negative result does not preclude SARS-Cov-2 infection and should not be used as the sole basis for treatment or other patient management decisions. A negative result may occur with  improper specimen collection/handling, submission of specimen other than nasopharyngeal swab, presence of viral mutation(s) within the areas targeted by this assay, and inadequate number of viral copies (<131 copies/mL). A negative result must be combined with clinical observations, patient history, and epidemiological information. The expected result is Negative. Fact Sheet for Patients:  PinkCheek.be Fact Sheet for Healthcare Providers:  GravelBags.it This test is not yet ap proved or cleared by the [place] FDA and  has been authorized for detection and/or diagnosis of SARS-CoV-2 by FDA under an Emergency Use Authorization (EUA). This EUA will remain  in effect (meaning this test can be used) for the duration of the COVID-19 declaration under Section 564(b)(1) of the Act, 21 U.S.C. section 360bbb-3(b)(1), unless the authorization is terminated or revoked sooner.    Influenza A by PCR NEGATIVE NEGATIVE Final   Influenza B by PCR NEGATIVE NEGATIVE Final    Comment: (NOTE) The Xpert Xpress SARS-CoV-2/FLU/RSV assay is intended as an aid in  the diagnosis of influenza from Nasopharyngeal swab specimens and  should not be used as a sole basis for treatment. Nasal washings and  aspirates are unacceptable for Xpert Xpress SARS-CoV-2/FLU/RSV  testing. Fact Sheet for Patients: PinkCheek.be Fact Sheet for Healthcare Providers: GravelBags.it This test is not yet approved or cleared by the [place] FDA and  has been authorized for detection and/or diagnosis of SARS-CoV-2 by  FDA under an Emergency Use Authorization (EUA). This EUA will remain  in effect (meaning  this test can be used) for the duration of the  Covid-19 declaration under Section 564(b)(1) of the Act, 21  U.S.C. section 360bbb-3(b)(1), unless the authorization is  terminated or revoked. Performed at [facility], [address] [place], [place] [address]   MRSA PCR Screening     Status: Abnormal   Collection Time: 12/07/19 11:17 AM   Specimen: Nasopharyngeal  Result Value Ref Range Status   MRSA by PCR POSITIVE (A) NEGATIVE Final    Comment:        The GeneXpert MRSA Assay (FDA approved for NASAL specimens only), is one component of a comprehensive MRSA colonization surveillance program. It is not intended to diagnose MRSA infection nor to guide or monitor treatment for MRSA infections. RESULT CALLED TO, READ BACK BY AND VERIFIED WITH: Netta Neat RN AT [id number] ON 12/07/19 SNG Performed at [facility], [address] [place], [place] [address]   Culture, respiratory (non-expectorated)     Status: None   Collection Time: 12/08/19  4:44 AM   Specimen: Tracheal Aspirate; Respiratory  Result Value Ref Range Status   Specimen Description   Final    TRACHEAL ASPIRATE Performed at [facility], [address] [place], [place] [address]    Special Requests   Final    NONE Performed at [facility], [address] [place], [place] [address]    Gram Stain   Final    FEW WBC PRESENT, PREDOMINANTLY PMN FEW YEAST Performed at [facility], [place] [address], [place], [place] [address]    Culture FEW CANDIDA ALBICANS  Final   Report  Status 12/10/2019 FINAL  Final          IMAGING    DG Chest Port 1 View  Result Date: 12/12/2019 CLINICAL DATA:  Respiratory failure.  Ventilator support. EXAM: PORTABLE CHEST 1 VIEW COMPARISON:  12/10/2019 FINDINGS: Endotracheal tube tip 5 cm above the carina. Orogastric or nasogastric tube is been removed. Right internal jugular central line tip at the SVC RA junction. Widespread pulmonary  infiltrates persist, more extensive in the right lung than the left. No worsening or other new finding. IMPRESSION: Orogastric or nasogastric tube removed. No other change. Widespread infiltrates right worse than left. Electronically Signed   By: Nelson Chimes M.D.   On: 12/12/2019 02:26       Indwelling Urinary Catheter continued, requirement due to   Reason to continue Indwelling Urinary Catheter strict Intake/Output monitoring for hemodynamic instability   Central Line/ continued, requirement due to  Reason to continue [place] of central venous pressure or other hemodynamic parameters and poor IV access   Ventilator continued, requirement due to severe respiratory failure   Ventilator Sedation RASS 0 to -2      ASSESSMENT AND PLAN SYNOPSIS  Acute respiratory failure with hypoxia Ventilator dependence Acute drug overdose: Cocaine and benzodiazepines Patient remains encephalopathic Unable to wean Neurogenic breathing pattern with ineffective ventilator triggering Family has opted for 1 way extubation and comfort measures Family gathering today for extubation to comfort measures   Severe acute encephalopathy, suspect anoxic Watershed infarcts Severe CNS dysfunction As above patient will be transition to comfort care Family is slowly arriving from out of town Transitioned to comfort care Extubate when family ready  DKA Resolved Continue glycemic control per ICU guidelines  Aspiration pneumonia Strep pneumo urinary antigen positive Discontinue antibiotic in anticipation for comfort care  Acute kidney injury Resolved   CARDIAC Decreased left ventricular ejection fraction 40 to 45%   DVT and GI prophylaxis not indicated at this time as patient has transitioned to comfort care   Overall, patient is critically ill, prognosis is poor. Very poor chance of meaningful recovery. Patient with multiorgan failure due to cocaine abuse and watershed  infarcts, anoxic encephalopathy and severe brain damage.  DNR/DNI status.  Transition to comfort care with one-way extubation when all family has gathered.    Renold Don, MD [facility]  *This note was dictated using voice recognition software/Dragon.  Despite best efforts to proofread, errors can occur which can change the meaning.  Any change was purely unintentional.

## 2019-12-12 NOTE — Progress Notes (Signed)
Richard [name] was extubated at 1817 to room air.  Family was asked to step into the waiting room and they were invited to return minutes post extubation.

## 2019-12-12 NOTE — Progress Notes (Signed)
Subjective: Patient remains intubated, sedated and unresponsive.    Objective: Current vital signs: BP (!) 100/59   Pulse (!) 57   Temp (!) 97.4 F (36.3 C) (Axillary)   Resp (!) 26   Ht 6' (1.829 m)   Wt 65 kg   SpO2 96%   BMI 19.43 kg/m  Vital signs in last 24 hours: Temp:  [97.4 F (36.3 C)-98 F (36.7 C)] 97.4 F (36.3 C) (02/20 0400) Pulse Rate:  [34-85] 57 (02/20 1000) Resp:  [0-27] 26 (02/20 1000) BP: (89-136)/(55-88) 100/59 (02/20 1000) SpO2:  [89 %-100 %] 96 % (02/20 1000) FiO2 (%):  [28 %-35 %] 35 % (02/20 0739)  Intake/Output from previous day: 02/19 0701 - 02/20 0700 In: 1426.7 [I.V.:979.2; NG/GT:360; IV Piggyback:87.5] Out: 1200 [Urine:1200] Intake/Output this shift: Total I/O In: 109.9 [I.V.:109.9] Out: -  Nutritional status:  Diet Order            Diet NPO time specified  Diet effective now              Neurologic Exam: Mental Status: Patient does not respond to verbal stimuli. Does not respond to deep sternal rub. Does not follow commands. No verbalizations noted.  Cranial Nerves: II: patient does not respond confrontation bilaterally, pupils right9m, left 323mandminimally reactivebilaterally III,IV,VI: Oculocephalic response absent bilaterally.  V,VII: corneal reflexpresentbilaterally VIII: patient does not respond to verbal stimuli IX,X: gag reflexreduced, XI: trapezius strength unable to test bilaterally XII: tongue strength unable to test Motor: Extremities flaccid throughout. No spontaneous movement noted. No purposeful movements noted. Sensory: Does not respond to noxious stimuli in any extremity.  Lab Results: Basic Metabolic Panel: Recent Labs  Lab 11/27/2019 1459 11/30/2019 1629 12/08/2019 1929 12/07/19 1117 12/07/19 1700 12/08/19 0340 12/08/19 0340 12/09/19 0443 12/09/19 0443 12/10/19 0354 12/11/19 0339 12/12/19 0343  NA 136  142  --    < > 143   < > 144  --  145  --  145 148* 151*  K 4.2  2.6*  --    < >  3.9   < > 5.1  --  4.6  --  4.1 3.4* 3.1*  CL 100  114*  --    < > 114*   < > 110  --  110  --  110 111 115*  CO2 19*  17*  --    < > 23   < > 24  --  27  --  24 23 26   GLUCOSE 807*  625*  --    < > 124*   < > 189*  --  167*  --  239* 151* 51*  BUN 21  17  --    < > 28*   < > 35*  --  53*  --  72* 88* 93*  CREATININE 1.55*  1.11  --    < > 2.11*   < > 2.55*  --  3.33*  --  3.51* 3.19* 2.71*  CALCIUM 8.4*  6.0*  --    < > 7.6*   < > 7.4*   < > 7.6*   < > 7.7* 7.7* 7.6*  MG  --  1.8  --  2.0  --  1.8  --  2.3  --   --  2.6*  --   PHOS 7.5*  --   --  5.3*  --  5.1*  --  6.1*  --   --   --   --    < > =  values in this interval not displayed.    Liver Function Tests: Recent Labs  Lab 11/23/2019 1459  AST 28  15  ALT 13  10  ALKPHOS 27*  21*  BILITOT 1.0  0.9  PROT 5.8*  4.4*  ALBUMIN 2.6*  2.0*   No results for input(s): LIPASE, AMYLASE in the last 168 hours. Recent Labs  Lab 11/23/2019 1309  AMMONIA 29    CBC: Recent Labs  Lab 11/29/2019 1459 12/07/19 0651 12/08/19 0340 12/09/19 0443 12/10/19 0354 12/11/19 0339 12/12/19 0343  WBC 4.3   < > 10.4 9.3 7.0 7.5 9.4  NEUTROABS 3.6  --   --  8.5* 6.3 6.0  --   HGB 8.8*   < > 9.9* 9.2* 9.0* 8.6* 8.3*  HCT 28.7*   < > 30.4* 27.7* 27.8* 25.0* 23.5*  MCV 90.8   < > 87.1 84.5 84.8 80.6 79.1*  PLT 169   < > 118* 99* 73* 64* 68*   < > = values in this interval not displayed.    Cardiac Enzymes: Recent Labs  Lab 12/09/19 1031  CKTOTAL 845*    Lipid Panel: Recent Labs  Lab 12/08/19 2020  TRIG 67    CBG: Recent Labs  Lab 12/11/19 1134 12/12/19 0334 12/12/19 0639 12/12/19 0656 12/12/19 0710  GLUCAP 105* 125* 19* 146* 107*    Microbiology: Results for orders placed or performed during the hospital encounter of 12/16/2019  Culture, blood (routine x 2)     Status: None   Collection Time: 11/24/2019  1:01 PM   Specimen: BLOOD  Result Value Ref Range Status   Specimen Description BLOOD BLOOD RIGHT FOREARM  Final    Special Requests   Final    BOTTLES DRAWN AEROBIC AND ANAEROBIC Blood Culture adequate volume   Culture   Final    NO GROWTH 5 DAYS Performed at [facility], [address] [place], [place] [address]    Report Status 12/11/2019 FINAL  Final  Respiratory Panel by RT PCR (Flu A&B, Covid) - Nasopharyngeal Swab     Status: None   Collection Time: 12/16/2019  2:35 PM   Specimen: Nasopharyngeal Swab  Result Value Ref Range Status   SARS Coronavirus 2 by RT PCR NEGATIVE NEGATIVE Final    Comment: (NOTE) SARS-CoV-2 target nucleic acids are NOT DETECTED. The SARS-CoV-2 RNA is generally detectable in upper respiratoy specimens during the acute phase of infection. The lowest concentration of SARS-CoV-2 viral copies this assay can detect is 131 copies/mL. A negative result does not preclude SARS-Cov-2 infection and should not be used as the sole basis for treatment or other patient management decisions. A negative result may occur with  improper specimen collection/handling, submission of specimen other than nasopharyngeal swab, presence of viral mutation(s) within the areas targeted by this assay, and inadequate number of viral copies (<131 copies/mL). A negative result must be combined with clinical observations, patient history, and epidemiological information. The expected result is Negative. Fact Sheet for Patients:  PinkCheek.be Fact Sheet for Healthcare Providers:  GravelBags.it This test is not yet ap proved or cleared by the [place] FDA and  has been authorized for detection and/or diagnosis of SARS-CoV-2 by FDA under an Emergency Use Authorization (EUA). This EUA will remain  in effect (meaning this test can be used) for the duration of the COVID-19 declaration under Section 564(b)(1) of the Act, 21 U.S.C. section 360bbb-3(b)(1), unless the authorization is terminated or revoked sooner.    Influenza A by  PCR NEGATIVE NEGATIVE Final   Influenza B by PCR NEGATIVE NEGATIVE Final    Comment: (NOTE) The Xpert Xpress SARS-CoV-2/FLU/RSV assay is intended as an aid in  the diagnosis of influenza from Nasopharyngeal swab specimens and  should not be used as a sole basis for treatment. Nasal washings and  aspirates are unacceptable for Xpert Xpress SARS-CoV-2/FLU/RSV  testing. Fact Sheet for Patients: PinkCheek.be Fact Sheet for Healthcare Providers: GravelBags.it This test is not yet approved or cleared by the [place] FDA and  has been authorized for detection and/or diagnosis of SARS-CoV-2 by  FDA under an Emergency Use Authorization (EUA). This EUA will remain  in effect (meaning this test can be used) for the duration of the  Covid-19 declaration under Section 564(b)(1) of the Act, 21  U.S.C. section 360bbb-3(b)(1), unless the authorization is  terminated or revoked. Performed at [facility], [address] [place], [place] [address]   MRSA PCR Screening     Status: Abnormal   Collection Time: 12/07/19 11:17 AM   Specimen: Nasopharyngeal  Result Value Ref Range Status   MRSA by PCR POSITIVE (A) NEGATIVE Final    Comment:        The GeneXpert MRSA Assay (FDA approved for NASAL specimens only), is one component of a comprehensive MRSA colonization surveillance program. It is not intended to diagnose MRSA infection nor to guide or monitor treatment for MRSA infections. RESULT CALLED TO, READ BACK BY AND VERIFIED WITH: Netta Neat RN AT 6160 ON 12/07/19 SNG Performed at [facility], [address] [place], [place] [address]   Culture, respiratory (non-expectorated)     Status: None   Collection Time: 12/08/19  4:44 AM   Specimen: Tracheal Aspirate; Respiratory  Result Value Ref Range Status   Specimen Description   Final    TRACHEAL ASPIRATE Performed at [facility], [address] [place], [place] [address]    Special Requests   Final    NONE Performed at [facility], [address] [place], [place] [address]    Gram Stain   Final    FEW WBC PRESENT, PREDOMINANTLY PMN FEW YEAST Performed at [facility], [address] [place], [place] [address]    Culture FEW CANDIDA ALBICANS  Final   Report Status 12/10/2019 FINAL  Final    Coagulation Studies: No results for input(s): LABPROT, INR in the last 72 hours.  Imaging: DG Chest Port 1 View  Result Date: 12/12/2019 CLINICAL DATA:  Respiratory failure.  Ventilator support. EXAM: PORTABLE CHEST 1 VIEW COMPARISON:  12/10/2019 FINDINGS: Endotracheal tube tip 5 cm above the carina. Orogastric or nasogastric tube is been removed. Right internal jugular central line tip at the SVC RA junction. Widespread pulmonary infiltrates persist, more extensive in the right lung than the left. No worsening or other new finding. IMPRESSION: Orogastric or nasogastric tube removed. No other change. Widespread infiltrates right worse than left. Electronically Signed   By: Nelson Chimes M.D.   On: 12/12/2019 02:26    Medications:  I have reviewed the patient's current medications. Scheduled: . amiodarone  150 mg Intravenous Once  . chlorhexidine gluconate (MEDLINE KIT)  15 mL Mouth Rinse BID  . Chlorhexidine Gluconate Cloth  6 each Topical Daily  . heparin  5,000 Units Subcutaneous Q12H  . mouth rinse  15 mL Mouth Rinse 10 times per day  . pantoprazole (PROTONIX) IV  40 mg Intravenous q1800  . potassium chloride  40 mEq Per  Tube Once  . sodium chloride flush  10-40 mL Intracatheter Q12H    Assessment/Plan: 64 year old male with a history of polysubstance abuse admittedwithseptic shock, DKA, and severeacutehypoxichypercapnicrespiratoryfailure secondary to drug overdose(cocaine & Benzo's)and aspiration pneumonia. Now intubated and sedated. Due to sedation neurological examination is suboptimal.Renal  functions worsening.EEG reviewed and is only significant for slowing and triphasic waves, consistent with encephaloathy. 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 There is also a punctate infarction in the left posterior parietal lobe. Suspect embolic etiologyalthough hypoperfusion related to presentation a possibility as well.This injury is not significant enough to explain patient's mental status but patient with multiple metabolic abnormalities and complicating medical factors that are more likely contributors.  Echocardiogram shows EF of 40-45% with no evidence of thrombus or signs of endocarditis.  Recommendations: 1.Agree with continued addressing of medical issues    LOS: 5 days   [name], MD Neurology [phone] 12/12/2019  10:28 AM

## 2019-12-12 NOTE — Progress Notes (Signed)
Pharmacy Electrolyte Monitoring Consult:  Pharmacy consulted to assist in monitoring and replacing electrolytes in this 64 y.o. male admitted on 12/09/2019 with cardiac arrest and respiratory failure secondary to suspected intentional overdose. Patient in DKA.   Labs:  Sodium (mmol/L)  Date Value  12/12/2019 151 (H)   Potassium (mmol/L)  Date Value  12/12/2019 3.1 (L)   Magnesium (mg/dL)  Date Value  12/11/2019 2.6 (H)   Phosphorus (mg/dL)  Date Value  12/09/2019 6.1 (H)   Calcium (mg/dL)  Date Value  12/12/2019 7.6 (L)   Albumin (g/dL)  Date Value  12/10/2019 2.0 (L)  12/10/2019 2.6 (L)    Assessment/Plan: MRI showing watershed infarcts. Palliative consult, plan for one way extubation and comfort care once family arrives. Continue medical treatment until that time.  K 3.1  Scr 2.71. Renal function with improvement this morning.  Potassium 40 mEq per tube x 1 dose.  Will likely defer ordering labs after  to MD, but can replace as necessary.  Will replace for goal potassium ~ 4 and magnesium ~ 2.   Pharmacy will continue to monitor and adjust per consult.   Liliauna Santoni A, PharmD 12/12/2019 7:46 AM

## 2019-12-13 DIAGNOSIS — I639 Cerebral infarction, unspecified: Secondary | ICD-10-CM

## 2019-12-13 MED ORDER — MORPHINE 100MG IN NS 100ML (1MG/ML) PREMIX INFUSION
1.0000 mg/h | INTRAVENOUS | Status: DC
  Administered 2019-12-13: 1 mg/h via INTRAVENOUS
  Filled 2019-12-13 (×2): qty 100

## 2019-12-14 NOTE — Plan of Care (Addendum)
Time of death was 23:45. 2 nurse verification - Earlie Server, RN and Hinton Dyer, RN.  Pt had been on a morphine drip.  Sister and mother were at the bedside.  Sister called girlfriend Wynn Banker who is his HCPOA.  AC, Rufina Falco DNP, Chaplain notified.  COPA called and was disqualified as a donor.  Pt's family grieving appropriately and staff provided comfort and sympathy.  At this time, no arrangements have been made.

## 2019-12-14 NOTE — Plan of Care (Signed)
HCPOA, Richard [name] was very upset about how she had been treated.  Wasn't notified that pt transferred from ICU to 1A.  AC spoke to her and tried to ameliorate the situation - believed outcome was positive.

## 2019-12-21 LAB — BLOOD GAS, VENOUS
Acid-base deficit: 4.5 mmol/L — ABNORMAL HIGH (ref 0.0–2.0)
Bicarbonate: 24.4 mmol/L (ref 20.0–28.0)
Delivery systems: POSITIVE
FIO2: 100
O2 Saturation: 19.2 %
Patient temperature: 37
pCO2, Ven: 61 mmHg — ABNORMAL HIGH (ref 44.0–60.0)
pH, Ven: 7.21 — ABNORMAL LOW (ref 7.250–7.430)

## 2019-12-21 NOTE — Progress Notes (Signed)
I was called to patient's bedside to pronounce that patient  Kemaury Quance has died. No spontaneous movements were present. There was not response to verbal or tactile stimuli. Pupils were mid-dilated and fixed. No breath sounds were appreciated over either lung field. No carotid pulses were palpable. No hear sounds were auscultated over the entire precordium. Patient pronounced dead at Dec 20, 2019, 2345. Family members at the bedside. Family declines autopsy. Chaplain and postmortem services offered. The family declines organ donation. Patient was DNR/DNI at the time of death and transitioned to comfort care. Patient's medical illness was acute respiratory failure with hypoxia due to drug overdose. Time of death was 2345 witnessed by patient's primary RN.   Rufina Falco, DNP, CCRN, FNP-C [facility] Nurse Practitioner Between 7pm to 7am - Pager [phone] Actively using [employer] secure chat messaging  After 7am go to [email or web address] - [email or web address]  [facility] [employer]  [phone]

## 2019-12-21 NOTE — Progress Notes (Signed)
   16-Dec-2019 2247  Clinical Encounter Type  Visited With Patient and family together  Visit Type Follow-up  Referral From Nurse  Consult/Referral To Chaplain  Spiritual Encounters  Spiritual Needs Prayer;Emotional;Grief support  [facility] received page to visit pt who is on comfort care. Pt was resting upon arrival. Did not wake up to verbal stimuli. Pt's mother and sister were bedside. Pt displayed signs of apnea. Support system is grieving. Gave mother space to vent. [place] provided pastoral care though validation of feelings and emotions and prayer upon request.

## 2019-12-21 NOTE — Progress Notes (Addendum)
Subjective: Transitioned to comfort care yesterday.  Appears comfortable.  Being transitioned from fentanyl to morphine infusions again transfer to floor.  Not requiring much narcotic at all.  SIGNIFICANT EVENTS: 2/14>> Presented to ED 2/14>> No ICU beds available at [facility], to be transferred to [facility] 2/15>>No ICU beds available at [facility], will be admitted to [facility] ICU as beds now available 2/17 SEVERE RESP FAILURE, CONCERNS FOR BRAIN INJURY EEG NOTED, MRI PENDING 2/17 SISTER CALLED MULTIPLE TIMES, NO ANSWER AND ALSO I WAS UNABLE TO LEAVE VM 2/18 severe respiratory failure severe encephalopathy severe brain damage 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  On wake-up assessment remains obtunded with neurogenic breathing pattern 2/20 transitioned to comfort care yesterday  Objective: Appears comfortable, no respiratory distress, unresponsive O2 sats 76%, heart rate fluctuates between 60 to 75 bpm   A/P:  Acute respiratory failure with hypoxia Acute drug overdose: Cocaine and benzodiazepines Anoxic encephalopathy and watershed infarcts On comfort care, occasions appear adequate for comfort  Severe acute encephalopathy, suspect anoxic Watershed infarcts Severe CNS dysfunction Transitioned to comfort care  DKA Resolved   Aspiration pneumonia Strep pneumo urinary antigen positive   Acute kidney injury Resolved   CARDIAC Decreased left ventricular ejection fraction 40 to 45%   DVT and GI prophylaxis not indicated at this time as patient has transitioned to comfort care  Patient with multiorgan failure due to cocaine abuse and watershed infarcts, anoxic encephalopathy and severe brain damage.  Continue comfort measures.  DNR/DNI status.   Patient has seen palliative medicine previously in May 2020 and he had completed advanced directives to include MOST  form he has elected DNR and had also selected to avoid ICU.  Significant other had been made HCPOA.  Patient has been transitioned to comfort care per his prior wishes also corroborated by [facility] and family.    Renold Don, MD [facility]   *This note was dictated using voice recognition software/Dragon.  Despite best efforts to proofread, errors can occur which can change the meaning.  Any change was purely unintentional.

## 2019-12-21 NOTE — Progress Notes (Signed)
Spoke with Wynn Banker POA earlier and made her aware that the ME would be notified due to OD of medications.

## 2019-12-21 NOTE — Death Summary Note (Signed)
DEATH SUMMARY   Patient Details  Name: Richard [name] MRN: [id number] DOB: 10/15/1956  Admission/Discharge Information   Admit Date:  Dec 17, 2019  Date of Death: Date of Death: 2019/12/24  Time of Death: Time of Death: 2344/01/25  Length of Stay: 7  Referring Physician: Patient, No Pcp Per   Reason(s) for Hospitalization  Acute respiratory failure with hypoxia secondary to drug overdose  Diagnoses  Preliminary cause of death:  Secondary Diagnoses (including complications and co-morbidities):  Active Problems:   Sepsis ([place])   Respiratory failure requiring intubation (HCC)   CHF (congestive heart failure), NYHA class II, acute, systolic (HCC)   Goals of care, counseling/discussion   DNR (do not resuscitate)   Palliative care by specialist   Brief Hospital Course (including significant findings, care, treatment, and services provided and events leading to death)  Richard [name] is a 64 y.o. year old male who presented to the ED on 2022/12/16 with Septic shock, DKA, and severe Acute Hypoxic Hypercapnic Respiratory Failure secondary to drug overdose (cocaine & Benzo's) and aspiration pneumonia. Patient was intubated in the ED.   Upon arrival to the ED he was transitioned to BiPAP from nonrebreather, however he required intubation due to further emesis with ongoing aspiration.  Initial workup in the ED revealed Glucose 625, potassium 2.6, Bicarb 17, Creatinine 1.11, Anion gap 11, high sensitivity troponin 18, lactic acid 3.5, WBC 4.3, hemoglobin 8.8, beta hydroxybutyric acid 4.27, acetaminophen [id number], salicylate <7.  His COVID-19 PCR is negative and Influenza PCR is negative.  Urinalysis is negative for UTI, positive for ketones.  Urine drug screen is positive for Benzodiazepines and cocaine.  CXR is concerning for aspiration pneumonia.  He required ICU admission, however there were no ICU beds available here at [facility], thus he remained in the ED while awaiting transfer to [facility].  He became more hypotensive  requiring increased vasopressors and placement of central line.  Patient was transferred to the ICU on 2/15 as there were no beds at [facility] for further management of : Acute Hypoxic & Hypercapnic Respiratory Failure secondary to drug overdose (Cocaine & Benzo's) and Aspiration Pneumonia - Patient was maintained on mechanical ventilation with weaning trials performed per ICU protocol -Treated with Zosyn and PRN bronchodilators  Septic Shock secondary to aspiration + Hypovolemic shock secondary to DKA Hx: HTN, CAD, CHF, A-fib - Placed on continuous cardiac monitoring - Maintain MAP >65 - Received IV Fluid resuscitation both in ED and ICU - Vasopressors as needed to maintain MAP goal - Added  IV Solu-cortef - Treated with empiric abx - 2D Echocardiogram was obtained see results   AKI - strict I&0 maintained - Nephrotoxins was held with monitoring of daily BMPs   DKA  -Treated per DKA protocol  Severe acute encephalopathy, suspect anoxic Watershed infarcts Severe CNS dysfunction - Neurology consulted - Palliated consulted - Patient was extubated on 12/12/19 at 1817 to room air. Transitioned to comfort care given poor prognosis. Patient passed away at 2345 on 12-24-19 surrounding by family at bedside   Pertinent Labs and Studies  Significant Diagnostic Studies EEG  Result Date: 12/08/2019 Richard Goodell, MD     12/08/2019  6:38 PM ELECTROENCEPHALOGRAM REPORT Patient: Richard [name]       Room #: IC05A-AA EEG No. ID: [id number] Age: 64 y.o.        Sex: male Requesting Physician: Richard [name] Report Date:  12/08/2019       Interpreting Physician: Richard [name] History: Richard [name] is an 64 y.o. male with polysubstance  abuse and septic shock Medications: Fentanyl, Versed, Pitressin, Amiodarone, Insulin, Solucortef, Pepcid, Unasyn, Amiodarone Conditions of Recording:  This is a 21 channel routine scalp EEG performed with bipolar and monopolar montages arranged in accordance to the  international 10/20 system of electrode placement. One channel was dedicated to EKG recording. The patient is in the intubated and sedated state. Description:  The background activity is slow and poorly organized.  It consists of a low voltage polymorphic delta activity that is continuous and diffusely distributed.  Also noted are occasional intermittent periodic discharges of triphasic morphology.  Hyperventilation and intermittent photic stimulation were not performed.  IMPRESSION: This is an abnormal electroencephalogram due to general background slowing and intermittent triphasic waves.  These are seen most commonly in encephalopathic states.  Although most often seen with hepatic encephalopathies, it is not isolated to this clinical scenario. Richard Goodell, MD Neurology [phone] 12/08/2019, 6:17 PM   DG Chest 1 View  Result Date: 11/25/2019 CLINICAL DATA:  Pt via EMS from home. Pt was found unresponsive. Hx of afib, chf, cad EXAM: CHEST  1 VIEW COMPARISON:  Chest radiograph 10/30/2019 FINDINGS: The heart size and mediastinal contours are within normal limits. Aortic arch calcification. There are coarse airspace opacities in the left mid lung and possibly more subtly in the right lower lung suspicious for infection or aspiration. No pneumothorax or significant pleural effusion. No acute finding in the visualized skeleton. IMPRESSION: Coarse airspace opacities in the left mid lung and possibly in the right lower lung suspicious for infection or aspiration. Electronically Signed   By: Richard [name] M.D.   On: 12/16/2019 13:36   DG Abdomen 1 View  Result Date: 12/05/2019 CLINICAL DATA:  Status post OG tube placement EXAM: ABDOMEN - 1 VIEW COMPARISON:  Abdominal radiograph 10/29/2019 FINDINGS: Interval placement of a nasogastric tube with side port appropriately positioned over the stomach. The abdomen is largely excluded from field of view. Bilateral airspace opacities better evaluated on  concurrent chest radiograph IMPRESSION: Interval placement of a nasogastric tube with side port appropriately positioned over the stomach. Electronically Signed   By: Richard [name] M.D.   On: 12/16/2019 16:59   CT Head Wo Contrast  Result Date: 11/28/2019 CLINICAL DATA:  Altered mental status EXAM: CT HEAD WITHOUT CONTRAST TECHNIQUE: Contiguous axial images were obtained from the base of the skull through the vertex without intravenous contrast. COMPARISON:  CT brain 10/29/2019 FINDINGS: Brain: No acute territorial infarction, hemorrhage, or intracranial mass. Mild atrophy. Patchy hypodensity in the white matter consistent with chronic small vessel ischemic change. Stable ventricle size Vascular: No hyperdense vessels. Carotid and vertebral arterial calcification Skull: Normal. Negative for fracture or focal lesion. Sinuses/Orbits: Mucosal thickening in the ethmoid maxillary and sphenoid sinuses Other: None IMPRESSION: 1. No CT evidence for acute intracranial abnormality. 2. Atrophy and mild chronic small vessel ischemic change of the white matter Electronically Signed   By: Donavan Foil M.D.   On: 12/11/2019 16:31   MR BRAIN WO CONTRAST  Result Date: 12/10/2019 CLINICAL DATA:  Overdose an aspiration. Unresponsive. Anoxic brain injury. EXAM: MRI HEAD WITHOUT CONTRAST TECHNIQUE: Multiplanar, multiecho pulse sequences of the brain and surrounding structures were obtained without intravenous contrast. COMPARISON:  Head CT 4 days ago. FINDINGS: Brain: Patient does not show a pattern of widespread hypoxic ischemic brain injury. No abnormality is seen affecting the brainstem. Few old small vessel cerebellar infarctions. Left cerebral hemisphere shows a single punctate acute infarction in the left posteromedial parietal region. Right hemisphere shows scattered  acute infarctions in a watershed distribution from front to back at the anterior cerebral middle cerebral junction. The largest single infarction is a  2.5 cm infarction at the right frontal region. No large confluent infarction. No sign of swelling or hemorrhage. Elsewhere, there chronic small-vessel ischemic changes of the deep white matter. No evidence of mass, hemorrhage, hydrocephalus or extra-axial collection. Vascular: Major vessels at the base of the brain show flow. Skull and upper cervical spine: Negative Sinuses/Orbits: Clear/normal Other: None IMPRESSION: No evidence of widespread hypoxic ischemic injury. 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 No hemorrhage or mass effect. Single punctate infarction of the left posterior parietal brain. Electronically Signed   By: Nelson Chimes M.D.   On: 12/10/2019 03:30   DG Chest Port 1 View  Result Date: 12/12/2019 CLINICAL DATA:  Respiratory failure.  Ventilator support. EXAM: PORTABLE CHEST 1 VIEW COMPARISON:  12/10/2019 FINDINGS: Endotracheal tube tip 5 cm above the carina. Orogastric or nasogastric tube is been removed. Right internal jugular central line tip at the SVC RA junction. Widespread pulmonary infiltrates persist, more extensive in the right lung than the left. No worsening or other new finding. IMPRESSION: Orogastric or nasogastric tube removed. No other change. Widespread infiltrates right worse than left. Electronically Signed   By: Nelson Chimes M.D.   On: 12/12/2019 02:26   DG Chest Port 1 View  Result Date: 12/10/2019 CLINICAL DATA:  Acute respiratory failure. EXAM: PORTABLE CHEST 1 VIEW COMPARISON:  12/08/2019 FINDINGS: Endotracheal tube, enteric tube, and right internal jugular central line are unchanged in position. Patchy opacities throughout the right hemithorax with slight improvement. Questionable cavitary lesion in the right upper lobe again seen. Lucency paralleling the right chest wall favored to represent a skin fold with pulmonary markings extending beyond to the pleural surface. Patchy opacity in the left upper lobe  appears similar. Unchanged heart size and mediastinal contours with aortic atherosclerosis. IMPRESSION: 1. Slight improvement in patchy opacities throughout the right hemithorax. Questionable cavitary lesion in the right upper lobe again seen. Left upper lobe opacities are unchanged. 2. Lucency paralleling the right chest wall favored to represent a skin fold. Electronically Signed   By: Keith Rake M.D.   On: 12/10/2019 00:46   DG Chest Port 1 View  Result Date: 12/08/2019 CLINICAL DATA:  Acute respiratory failure. EXAM: PORTABLE CHEST 1 VIEW COMPARISON:  Multiple radiographs 12/12/2019 FINDINGS: Endotracheal tube tip 4.1 cm from the carina. Enteric tube in place with tip and side-port below the diaphragm. Right central line tip at the atrial caval junction. Progressive opacities throughout the right hemithorax. Question of developing cavitation in the right upper lobe. Additional patchy airspace disease in the left lung is not changed. Normal heart size with unchanged mediastinal contours. Aortic atherosclerosis. Suspected small right pleural effusion. No pneumothorax. IMPRESSION: 1. Progressive opacities throughout the right hemithorax. Question of developing cavitation in the right upper lobe. Additional patchy airspace disease in the left lung is not significantly changed. 2. Support apparatus unchanged. Electronically Signed   By: Keith Rake M.D.   On: 12/08/2019 02:01   DG Chest Portable 1 View  Result Date: 12/12/2019 CLINICAL DATA:  Central line placement, abnormal chest x-ray EXAM: PORTABLE CHEST 1 VIEW COMPARISON:  12/08/2019 at 1:58 p.m. FINDINGS: Two frontal views of the chest are obtained. Endotracheal tube unchanged, tip overlying thoracic inlet. Enteric catheter passes below diaphragm tip excluded by collimation. Right internal jugular catheter overlies the atrial caval junction. There is  widespread bilateral airspace disease, right greater than left, which has progressed since  prior study. No effusion or pneumothorax. IMPRESSION: 1. No complication after central venous catheter placement. 2. Worsening bilateral airspace disease consistent with progressive multifocal pneumonia. Electronically Signed   By: Randa Ngo M.D.   On: 11/23/2019 23:55   DG Chest Portable 1 View  Result Date: 11/27/2019 CLINICAL DATA:  S/p intubation. EXAM: PORTABLE CHEST 1 VIEW COMPARISON:  Chest radiograph 11/29/2019 FINDINGS: Interval intubation with endotracheal tube tip between the thoracic inlet and carina. Interval worsening of airspace opacities bilaterally with increased consolidation in the right lower lung concerning for multifocal infection or aspiration. No significant pleural effusion. There is an area of lucency in the peripheral left lung favored to represent a skin fold, less likely a pneumothorax. IMPRESSION: 1. Interval intubation with endotracheal tube tip between the thoracic inlet and carina. 2. Interval worsening of airspace opacities bilaterally with increased consolidation in the right lower lung concerning for multifocal infection or aspiration. 3. Area of lucency in the lateral left lung which appears to have bronchial markings beyond it on the prior radiograph, favored to represent a skin fold, less likely a pneumothorax. Repeat radiograph with patient centered on the film and as many leads/external objects off the chest as possible may be of use for clarification. These results were called by telephone at the time of interpretation on 12/11/2019 at 2:53 pm to provider MARK QUALE , who verbally acknowledged these results. Electronically Signed   By: Richard [name] M.D.   On:  14:54   ECHOCARDIOGRAM COMPLETE  Result Date: 12/07/2019    ECHOCARDIOGRAM REPORT   Patient Name:   TRAVARIUS BACHER Date of Exam: 12/07/2019 Medical Rec #:  [id number]    Height:       72.0 in Accession #:    [id number]   Weight:       110.0 lb Date of Birth:  1956-09-02    BSA:          1.65 m  Patient Age:    64 years     BP:           82/52 mmHg Patient Gender: M            HR:           104 bpm. Exam Location:  [facility] Procedure: Limited Color Doppler, Cardiac Doppler and 2D Echo Indications:     R06.89 Acute Respiratory Insufficiency  History:         Patient has prior history of Echocardiogram examinations. CHF,                  CAD, Arrythmias:Atrial Fibrillation; Risk Factors:Hypertension                  and Diabetes.  Sonographer:     Charmayne Sheer RDCS (AE) Referring Phys:  [id number] Bradly Bienenstock Diagnosing Phys: Kathlyn Sacramento MD  Sonographer Comments: Echo performed with patient supine and on artificial respirator, no parasternal window and suboptimal apical window. Image acquisition challenging due to patient body habitus. IMPRESSIONS  1. Left ventricular ejection fraction, by estimation, is 40 to 45%. The left ventricle has mildly decreased function. LV endocardial border not optimally defined to evaluate regional wall motion. Left ventricular diastolic parameters are indeterminate.  2. Right ventricular systolic function was not well visualized. The right ventricular size is normal. Tricuspid regurgitation signal is inadequate for assessing PA pressure.  3. The mitral valve is normal in structure and function.  No evidence of mitral valve regurgitation. No evidence of mitral stenosis.  4. The aortic valve is normal in structure and function. Aortic valve regurgitation is not visualized. No aortic stenosis is present.  5. The inferior vena cava is normal in size with <50% respiratory variability, suggesting right atrial pressure of 8 mmHg.  6. very limited and challenging images. FINDINGS  Left Ventricle: Left ventricular ejection fraction, by estimation, is 40 to 45%. The left ventricle has mildly decreased function. LV endocardial border not optimally defined to evaluate regional wall motion. The left ventricular internal cavity size was normal in size. There is no left ventricular hypertrophy.  Left ventricular diastolic parameters are indeterminate. Right Ventricle: The right ventricular size is normal. No increase in right ventricular wall thickness. Right ventricular systolic function was not well visualized. Tricuspid regurgitation signal is inadequate for assessing PA pressure. Left Atrium: Left atrial size was normal in size. Right Atrium: Right atrial size was normal in size. Pericardium: Trivial pericardial effusion is present. Mitral Valve: The mitral valve is normal in structure and function. Normal mobility of the mitral valve leaflets. No evidence of mitral valve regurgitation. No evidence of mitral valve stenosis. MV peak gradient, 0.9 mmHg. The mean mitral valve gradient is 1.0 mmHg. Tricuspid Valve: The tricuspid valve is normal in structure. Tricuspid valve regurgitation is not demonstrated. No evidence of tricuspid stenosis. Aortic Valve: The aortic valve is normal in structure and function. Aortic valve regurgitation is not visualized. No aortic stenosis is present. Aortic valve mean gradient measures 2.0 mmHg. Aortic valve peak gradient measures 2.9 mmHg. Pulmonic Valve: The pulmonic valve was normal in structure. Pulmonic valve regurgitation is not visualized. No evidence of pulmonic stenosis. Aorta: The aortic root is normal in size and structure. Venous: The inferior vena cava is normal in size with less than 50% respiratory variability, suggesting right atrial pressure of 8 mmHg. IAS/Shunts: No atrial level shunt detected by color flow Doppler.   Diastology LV e' lateral:   7.29 cm/s LV E/e' lateral: 5.9 LV e' medial:    4.24 cm/s LV E/e' medial:  10.1  AORTIC VALVE                   PULMONIC VALVE AV Vmax:           85.30 cm/s  PV Vmax:       0.88 m/s AV Vmean:          58.900 cm/s PV Vmean:      57.400 cm/s AV VTI:            0.105 m     PV VTI:        0.119 m AV Peak Grad:      2.9 mmHg    PV Peak grad:  3.1 mmHg AV Mean Grad:      2.0 mmHg    PV Mean grad:  2.0 mmHg LVOT Vmax:          70.90 cm/s LVOT Vmean:        53.900 cm/s LVOT VTI:          0.090 m LVOT/AV VTI ratio: 0.85 MITRAL VALVE MV Area (PHT): 3.48 cm    SHUNTS MV Peak grad:  0.9 mmHg    Systemic VTI: 0.09 m MV Mean grad:  1.0 mmHg MV Vmax:       0.46 m/s MV Vmean:      37.4 cm/s MV Decel Time: 218 msec MV E velocity: 42.70 cm/s MV A  velocity: 40.30 cm/s MV E/A ratio:  1.06 Kathlyn Sacramento MD Electronically signed by Kathlyn Sacramento MD Signature Date/Time: 12/07/2019/10:29:12 AM    Final     Microbiology Recent Results (from the past 240 hour(s))  Culture, blood (routine x 2)     Status: None   Collection Time: 12/08/2019  1:01 PM   Specimen: BLOOD  Result Value Ref Range Status   Specimen Description BLOOD BLOOD RIGHT FOREARM  Final   Special Requests   Final    BOTTLES DRAWN AEROBIC AND ANAEROBIC Blood Culture adequate volume   Culture   Final    NO GROWTH 5 DAYS Performed at [facility], [address] [place], [place] [address]    Report Status 12/11/2019 FINAL  Final  Respiratory Panel by RT PCR (Flu A&B, Covid) - Nasopharyngeal Swab     Status: None   Collection Time: 12/10/2019  2:35 PM   Specimen: Nasopharyngeal Swab  Result Value Ref Range Status   SARS Coronavirus 2 by RT PCR NEGATIVE NEGATIVE Final    Comment: (NOTE) SARS-CoV-2 target nucleic acids are NOT DETECTED. The SARS-CoV-2 RNA is generally detectable in upper respiratoy specimens during the acute phase of infection. The lowest concentration of SARS-CoV-2 viral copies this assay can detect is 131 copies/mL. A negative result does not preclude SARS-Cov-2 infection and should not be used as the sole basis for treatment or other patient management decisions. A negative result may occur with  improper specimen collection/handling, submission of specimen other than nasopharyngeal swab, presence of viral mutation(s) within the areas targeted by this assay, and inadequate number of viral copies (<131 copies/mL). A negative result  must be combined with clinical observations, patient history, and epidemiological information. The expected result is Negative. Fact Sheet for Patients:  PinkCheek.be Fact Sheet for Healthcare Providers:  GravelBags.it This test is not yet ap proved or cleared by the [place] FDA and  has been authorized for detection and/or diagnosis of SARS-CoV-2 by FDA under an Emergency Use Authorization (EUA). This EUA will remain  in effect (meaning this test can be used) for the duration of the COVID-19 declaration under Section 564(b)(1) of the Act, 21 U.S.C. section 360bbb-3(b)(1), unless the authorization is terminated or revoked sooner.    Influenza A by PCR NEGATIVE NEGATIVE Final   Influenza B by PCR NEGATIVE NEGATIVE Final    Comment: (NOTE) The Xpert Xpress SARS-CoV-2/FLU/RSV assay is intended as an aid in  the diagnosis of influenza from Nasopharyngeal swab specimens and  should not be used as a sole basis for treatment. Nasal washings and  aspirates are unacceptable for Xpert Xpress SARS-CoV-2/FLU/RSV  testing. Fact Sheet for Patients: PinkCheek.be Fact Sheet for Healthcare Providers: GravelBags.it This test is not yet approved or cleared by the [place] FDA and  has been authorized for detection and/or diagnosis of SARS-CoV-2 by  FDA under an Emergency Use Authorization (EUA). This EUA will remain  in effect (meaning this test can be used) for the duration of the  Covid-19 declaration under Section 564(b)(1) of the Act, 21  U.S.C. section 360bbb-3(b)(1), unless the authorization is  terminated or revoked. Performed at [facility], [address] [place], [place] [address]   MRSA PCR Screening     Status: Abnormal   Collection Time: 12/07/19 11:17 AM   Specimen: Nasopharyngeal  Result Value Ref Range Status   MRSA by PCR POSITIVE (A) NEGATIVE  Final    Comment:        The GeneXpert  MRSA Assay (FDA approved for NASAL specimens only), is one component of a comprehensive MRSA colonization surveillance program. It is not intended to diagnose MRSA infection nor to guide or monitor treatment for MRSA infections. RESULT CALLED TO, READ BACK BY AND VERIFIED WITH: Netta Neat RN AT [id number] ON 12/07/19 SNG Performed at [facility], [address] [place], [place] [address]   Culture, respiratory (non-expectorated)     Status: None   Collection Time: 12/08/19  4:44 AM   Specimen: Tracheal Aspirate; Respiratory  Result Value Ref Range Status   Specimen Description   Final    TRACHEAL ASPIRATE Performed at [facility], [address], [place], [place] [address]    Special Requests   Final    NONE Performed at [facility], [address] [place], [place] [address]    Gram Stain   Final    FEW WBC PRESENT, PREDOMINANTLY PMN FEW YEAST Performed at [facility], [place] [address], [place], [place] [address]    Culture FEW CANDIDA ALBICANS  Final   Report Status 12/10/2019 FINAL  Final    Lab Basic Metabolic Panel: Recent Labs  Lab 12/07/19 1117 12/07/19 1700 12/08/19 0340 12/09/19 0443 12/10/19 0354 12/11/19 0339 12/12/19 0343  NA 143   < > 144 145 145 148* 151*  K 3.9   < > 5.1 4.6 4.1 3.4* 3.1*  CL 114*   < > 110 110 110 111 115*  CO2 23   < > 24 27 24 23 26   GLUCOSE 124*   < > 189* 167* 239* 151* 51*  BUN 28*   < > 35* 53* 72* 88* 93*  CREATININE 2.11*   < > 2.55* 3.33* 3.51* 3.19* 2.71*  CALCIUM 7.6*   < > 7.4* 7.6* 7.7* 7.7* 7.6*  MG 2.0  --  1.8 2.3  --  2.6*  --   PHOS 5.3*  --  5.1* 6.1*  --   --   --    < > = values in this interval not displayed.   Liver Function Tests: No results for input(s): AST, ALT, ALKPHOS, BILITOT, PROT, ALBUMIN in the last 168 hours. No results for input(s): LIPASE, AMYLASE in the last 168 hours. No results for input(s): AMMONIA in the last  168 hours. CBC: Recent Labs  Lab 12/08/19 0340 12/09/19 0443 12/10/19 0354 12/11/19 0339 12/12/19 0343  WBC 10.4 9.3 7.0 7.5 9.4  NEUTROABS  --  8.5* 6.3 6.0  --   HGB 9.9* 9.2* 9.0* 8.6* 8.3*  HCT 30.4* 27.7* 27.8* 25.0* 23.5*  MCV 87.1 84.5 84.8 80.6 79.1*  PLT 118* 99* 73* 64* 68*   Cardiac Enzymes: Recent Labs  Lab 12/09/19 1031  CKTOTAL 845*   Sepsis Labs: Recent Labs  Lab 12/07/19 0651 12/07/19 0651 12/08/19 0340 12/08/19 0340 12/09/19 0443 12/10/19 0354 12/11/19 0339 12/12/19 0343  PROCALCITON 33.75  --  50.26  --  76.52 52.55  --   --   WBC 4.3   < > 10.4   < > 9.3 7.0 7.5 9.4   < > = values in this interval not displayed.    Procedures/Operations        Rufina Falco, DNP, CCRN, FNP-C [facility]  After 7am go to [email or web address] - [email or web address] [facility]  [facility] [employer]  [phone]

## 2019-12-21 DEATH — deceased
